# Patient Record
Sex: Male | Born: 1960 | Race: White | Hispanic: No | Smoking: Current every day smoker
Health system: Southern US, Community
[De-identification: ages and names within clinical notes are randomized; demographics above are authoritative.]

## PROBLEM LIST (undated history)

## (undated) DIAGNOSIS — I2510 Atherosclerotic heart disease of native coronary artery without angina pectoris: Secondary | ICD-10-CM

## (undated) DIAGNOSIS — Z9119 Patient's noncompliance with other medical treatment and regimen: Secondary | ICD-10-CM

## (undated) DIAGNOSIS — J449 Chronic obstructive pulmonary disease, unspecified: Secondary | ICD-10-CM

## (undated) DIAGNOSIS — I10 Essential (primary) hypertension: Secondary | ICD-10-CM

## (undated) DIAGNOSIS — I219 Acute myocardial infarction, unspecified: Secondary | ICD-10-CM

## (undated) DIAGNOSIS — F1910 Other psychoactive substance abuse, uncomplicated: Secondary | ICD-10-CM

## (undated) DIAGNOSIS — B1920 Unspecified viral hepatitis C without hepatic coma: Secondary | ICD-10-CM

## (undated) DIAGNOSIS — Z91199 Patient's noncompliance with other medical treatment and regimen due to unspecified reason: Secondary | ICD-10-CM

## (undated) DIAGNOSIS — I509 Heart failure, unspecified: Secondary | ICD-10-CM

## (undated) DIAGNOSIS — Z720 Tobacco use: Secondary | ICD-10-CM

## (undated) HISTORY — PX: BACK SURGERY: SHX140

## (undated) HISTORY — PX: OTHER SURGICAL HISTORY: SHX169

## (undated) HISTORY — PX: NECK SURGERY: SHX720

---

## 2018-06-12 ENCOUNTER — Emergency Department (HOSPITAL_COMMUNITY)
Admission: EM | Admit: 2018-06-12 | Discharge: 2018-06-12 | Disposition: A | Discharge status: Home / Self Care | Payer: Self-pay | Attending: Emergency Medicine | Admitting: Emergency Medicine

## 2018-06-12 ENCOUNTER — Other Ambulatory Visit: Payer: Self-pay

## 2018-06-12 ENCOUNTER — Encounter (HOSPITAL_COMMUNITY): Payer: Self-pay | Admitting: *Deleted

## 2018-06-12 ENCOUNTER — Emergency Department (HOSPITAL_COMMUNITY): Payer: Self-pay

## 2018-06-12 DIAGNOSIS — J449 Chronic obstructive pulmonary disease, unspecified: Secondary | ICD-10-CM | POA: Insufficient documentation

## 2018-06-12 DIAGNOSIS — J441 Chronic obstructive pulmonary disease with (acute) exacerbation: Secondary | ICD-10-CM

## 2018-06-12 DIAGNOSIS — I2510 Atherosclerotic heart disease of native coronary artery without angina pectoris: Secondary | ICD-10-CM | POA: Insufficient documentation

## 2018-06-12 DIAGNOSIS — R0602 Shortness of breath: Secondary | ICD-10-CM

## 2018-06-12 DIAGNOSIS — I252 Old myocardial infarction: Secondary | ICD-10-CM | POA: Insufficient documentation

## 2018-06-12 DIAGNOSIS — R062 Wheezing: Secondary | ICD-10-CM | POA: Insufficient documentation

## 2018-06-12 DIAGNOSIS — F17200 Nicotine dependence, unspecified, uncomplicated: Secondary | ICD-10-CM | POA: Insufficient documentation

## 2018-06-12 DIAGNOSIS — I10 Essential (primary) hypertension: Secondary | ICD-10-CM | POA: Insufficient documentation

## 2018-06-12 HISTORY — DX: Essential (primary) hypertension: I10

## 2018-06-12 HISTORY — DX: Unspecified viral hepatitis C without hepatic coma: B19.20

## 2018-06-12 HISTORY — DX: Acute myocardial infarction, unspecified: I21.9

## 2018-06-12 HISTORY — DX: Atherosclerotic heart disease of native coronary artery without angina pectoris: I25.10

## 2018-06-12 HISTORY — DX: Chronic obstructive pulmonary disease, unspecified: J44.9

## 2018-06-12 LAB — CBC
HCT: 44.2 % (ref 39.0–52.0)
Hemoglobin: 14.5 g/dL (ref 13.0–17.0)
MCH: 32.1 pg (ref 26.0–34.0)
MCHC: 32.8 g/dL (ref 30.0–36.0)
MCV: 97.8 fL (ref 80.0–100.0)
Platelets: 272 10*3/uL (ref 150–400)
RBC: 4.52 MIL/uL (ref 4.22–5.81)
RDW: 13.2 % (ref 11.5–15.5)
WBC: 8.8 10*3/uL (ref 4.0–10.5)
nRBC: 0 % (ref 0.0–0.2)

## 2018-06-12 LAB — BASIC METABOLIC PANEL
Anion gap: 9 (ref 5–15)
BUN: 9 mg/dL (ref 6–20)
CO2: 25 mmol/L (ref 22–32)
Calcium: 9.3 mg/dL (ref 8.9–10.3)
Chloride: 103 mmol/L (ref 98–111)
Creatinine, Ser: 0.74 mg/dL (ref 0.61–1.24)
GFR calc Af Amer: >60 mL/min (ref >60)
GFR calc non Af Amer: >60 mL/min (ref >60)
Glucose, Bld: 102 mg/dL — ABNORMAL HIGH (ref 70–99)
Potassium: 3.6 mmol/L (ref 3.5–5.1)
SODIUM: 137 mmol/L (ref 135–145)

## 2018-06-12 MED ORDER — ALBUTEROL SULFATE (2.5 MG/3ML) 0.083% IN NEBU
5.0000 mg | INHALATION_SOLUTION | Freq: Once | RESPIRATORY_TRACT | Status: DC
  Filled 2018-06-12: qty 6

## 2018-06-12 MED ORDER — ALBUTEROL SULFATE HFA 108 (90 BASE) MCG/ACT IN AERS
2.0000 | INHALATION_SPRAY | RESPIRATORY_TRACT | Status: DC
  Administered 2018-06-12: 2 via RESPIRATORY_TRACT
  Filled 2018-06-12: qty 6.7

## 2018-06-12 MED ORDER — ALBUTEROL (5 MG/ML) CONTINUOUS INHALATION SOLN
10.0000 mg/h | INHALATION_SOLUTION | Freq: Once | RESPIRATORY_TRACT | Status: AC
  Administered 2018-06-12: 10 mg/h via RESPIRATORY_TRACT
  Filled 2018-06-12: qty 40

## 2018-06-12 MED ORDER — PREDNISONE 20 MG PO TABS
60.0000 mg | ORAL_TABLET | Freq: Once | ORAL | Status: AC
  Administered 2018-06-12: 60 mg via ORAL
  Filled 2018-06-12: qty 3

## 2018-06-12 MED ORDER — IPRATROPIUM BROMIDE 0.02 % IN SOLN
0.5000 mg | Freq: Once | RESPIRATORY_TRACT | Status: AC
  Administered 2018-06-12: 0.5 mg via RESPIRATORY_TRACT
  Filled 2018-06-12: qty 2.5

## 2018-06-12 MED ORDER — PREDNISONE 50 MG PO TABS: ORAL_TABLET | ORAL | 0 refills | Status: DC

## 2018-06-12 MED ORDER — ALBUTEROL SULFATE HFA 108 (90 BASE) MCG/ACT IN AERS: 1.0000 | INHALATION_SPRAY | Freq: Four times a day (QID) | RESPIRATORY_TRACT | 0 refills | Status: DC | PRN

## 2018-06-12 NOTE — ED Notes (Signed)
Amber, RN aware of elevated BP previously taken.  Pt brought back to rm 5 from triage and was told to change into gown.

## 2018-06-12 NOTE — ED Provider Notes (Signed)
[facility]-EMERGENCY DEPT Provider Note   CSN: [id number] Arrival date & time: 06/12/18  1721    History   Chief Complaint Chief Complaint  Patient presents with  . flu like illness    HPI Francisco [name] is a 58 y.o. male.     58 year old male with history of hypertension and COPD who continues to use tobacco products who presents with several days of nonproductive cough as well as congestion.  Has been using his home nebulizer as well as inhaler without relief.  States that usually when he gets this way he requires prednisone.  Has had subjective fever last night but denies any vomiting or diarrhea.  No anginal or CHF type symptoms.     Past Medical History:  Diagnosis Date  . COPD (chronic obstructive pulmonary disease) (HCC)   . Coronary artery disease   . Hepatitis-C   . Hypertension   . MI (myocardial infarction) (HCC)     There are no active problems to display for this patient.   Past Surgical History:  Procedure Laterality Date  . BACK SURGERY    . NECK SURGERY    . stints          Home Medications    Prior to Admission medications   Not on File    Family History No family history on file.  Social History Social History   Tobacco Use  . Smoking status: Current Every Day Smoker  . Smokeless tobacco: Never Used  Substance Use Topics  . Alcohol use: Never    Frequency: Never  . 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    Physical Exam Updated Vital Signs BP (!) 153/109 (BP Location: Right Arm)   Pulse 98   Temp 98.8 F (37.1 C) (Oral)   Resp 20   Ht 1.829 m (6')   Wt 86.2 kg   SpO2 96%   BMI 25.77 kg/m   Physical Exam Vitals signs and nursing note reviewed.  Constitutional:      General: He is not in acute distress.  Appearance: Normal appearance. He is well-developed. He is not toxic-appearing.  HENT:     Head: Normocephalic and atraumatic.  Eyes:     General: Lids are normal.     Conjunctiva/sclera: Conjunctivae normal.     Pupils: Pupils are equal, round, and reactive to light.  Neck:     Musculoskeletal: Normal range of motion and neck supple.     Thyroid: No thyroid mass.     Trachea: No tracheal deviation.  Cardiovascular:     Rate and Rhythm: Normal rate and regular rhythm.     Heart sounds: Normal heart sounds. No murmur. No gallop.   Pulmonary:     Effort: Pulmonary effort is normal. No respiratory distress.     Breath sounds: No stridor. Examination of the right-upper field reveals decreased breath sounds and wheezing. Examination of the left-upper field reveals decreased breath sounds and wheezing. Decreased breath sounds and wheezing present. No rhonchi or rales.  Abdominal:     General: Bowel sounds are normal. There is no distension.     Palpations: Abdomen is soft.     Tenderness: There is no abdominal tenderness. There is no rebound.  Musculoskeletal: Normal range of motion.  General: No tenderness.  Skin:    General: Skin is warm and dry.     Findings: No abrasion or rash.  Neurological:     Mental Status: He is alert and oriented to person, place, and time.     GCS: GCS eye subscore is 4. GCS verbal subscore is 5. GCS motor subscore is 6.     Cranial Nerves: No cranial nerve deficit.     Sensory: No sensory deficit.  Psychiatric:        Speech: Speech normal.        Behavior: Behavior normal.      ED Treatments / Results  Labs (all labs ordered are listed, but only abnormal results are displayed) Labs Reviewed  CBC  BASIC METABOLIC PANEL    EKG EKG Interpretation  Date/Time:  Monday June 12 2018 17:31:22 EST Ventricular Rate:  93 PR Interval:    QRS Duration: 84 QT Interval:  346 QTC Calculation: 431 R Axis:   83 Text Interpretation:  Sinus  rhythm Right atrial enlargement Borderline repolarization abnormality No old tracing to compare Confirmed by Lorre Nick ([id number]) on 06/12/2018 8:00:14 PM   Radiology No results found.  Procedures Procedures (including critical care time)  Medications Ordered in ED Medications  albuterol (PROVENTIL) (2.5 MG/3ML) 0.083% nebulizer solution 5 mg (5 mg Nebulization Not Given 06/12/18 1858)  albuterol (PROVENTIL,VENTOLIN) solution continuous neb (has no administration in time range)  ipratropium (ATROVENT) nebulizer solution 0.5 mg (has no administration in time range)  predniSONE (DELTASONE) tablet 60 mg (has no administration in time range)     Initial Impression / Assessment and Plan / ED Course  I have reviewed the triage vital signs and the nursing notes.  Pertinent labs & imaging results that were available during my care of the patient were reviewed by me and considered in my medical decision making (see chart for details).        Patient given albuterol with Atrovent along with prednisone.  His wheezes have much improved.  Chest x-ray without acute findings.  We discharged home on prednisone burst and given albuterol to go home with  Final Clinical Impressions(s) / ED Diagnoses   Final diagnoses:  SOB (shortness of breath)    ED Discharge Orders    None       Lorre Nick, MD 06/12/18 2154

## 2018-06-12 NOTE — ED Triage Notes (Addendum)
Pt reports 2-3 days of coughing and runny nose, pt coughing green thick secretions, c/o difficulty breathing, has COPD Pt recently moved here and is out of his bp meds

## 2018-06-14 ENCOUNTER — Emergency Department (HOSPITAL_COMMUNITY)
Admission: EM | Admit: 2018-06-14 | Discharge: 2018-06-14 | Disposition: A | Discharge status: Home / Self Care | Payer: Self-pay | Attending: Emergency Medicine | Admitting: Emergency Medicine

## 2018-06-14 ENCOUNTER — Encounter (HOSPITAL_COMMUNITY): Payer: Self-pay

## 2018-06-14 ENCOUNTER — Other Ambulatory Visit: Payer: Self-pay

## 2018-06-14 ENCOUNTER — Emergency Department (HOSPITAL_COMMUNITY): Payer: Self-pay

## 2018-06-14 DIAGNOSIS — I10 Essential (primary) hypertension: Secondary | ICD-10-CM | POA: Insufficient documentation

## 2018-06-14 DIAGNOSIS — I2510 Atherosclerotic heart disease of native coronary artery without angina pectoris: Secondary | ICD-10-CM | POA: Insufficient documentation

## 2018-06-14 DIAGNOSIS — I252 Old myocardial infarction: Secondary | ICD-10-CM | POA: Insufficient documentation

## 2018-06-14 DIAGNOSIS — Z7982 Long term (current) use of aspirin: Secondary | ICD-10-CM | POA: Insufficient documentation

## 2018-06-14 DIAGNOSIS — Z79899 Other long term (current) drug therapy: Secondary | ICD-10-CM | POA: Insufficient documentation

## 2018-06-14 DIAGNOSIS — F17210 Nicotine dependence, cigarettes, uncomplicated: Secondary | ICD-10-CM | POA: Insufficient documentation

## 2018-06-14 DIAGNOSIS — J441 Chronic obstructive pulmonary disease with (acute) exacerbation: Secondary | ICD-10-CM | POA: Insufficient documentation

## 2018-06-14 LAB — INFLUENZA PANEL BY PCR (TYPE A & B)
Influenza A By PCR: NEGATIVE
Influenza B By PCR: NEGATIVE

## 2018-06-14 LAB — BASIC METABOLIC PANEL
Anion gap: 11 (ref 5–15)
BUN: 13 mg/dL (ref 6–20)
CALCIUM: 9.2 mg/dL (ref 8.9–10.3)
CO2: 23 mmol/L (ref 22–32)
CREATININE: 0.77 mg/dL (ref 0.61–1.24)
Chloride: 105 mmol/L (ref 98–111)
GFR calc Af Amer: >60 mL/min (ref >60)
GFR calc non Af Amer: >60 mL/min (ref >60)
Glucose, Bld: 130 mg/dL — ABNORMAL HIGH (ref 70–99)
Potassium: 4.2 mmol/L (ref 3.5–5.1)
Sodium: 139 mmol/L (ref 135–145)

## 2018-06-14 LAB — CBC WITH DIFFERENTIAL/PLATELET
Abs Immature Granulocytes: 0.04 10*3/uL (ref 0.00–0.07)
Basophils Absolute: 0 10*3/uL (ref 0.0–0.1)
Basophils Relative: 0 %
EOS ABS: 0 10*3/uL (ref 0.0–0.5)
Eosinophils Relative: 0 %
HCT: 45.2 % (ref 39.0–52.0)
Hemoglobin: 14.8 g/dL (ref 13.0–17.0)
Immature Granulocytes: 0 %
LYMPHS ABS: 1.3 10*3/uL (ref 0.7–4.0)
Lymphocytes Relative: 14 %
MCH: 32.7 pg (ref 26.0–34.0)
MCHC: 32.7 g/dL (ref 30.0–36.0)
MCV: 100 fL (ref 80.0–100.0)
Monocytes Absolute: 0.3 10*3/uL (ref 0.1–1.0)
Monocytes Relative: 3 %
NRBC: 0 % (ref 0.0–0.2)
Neutro Abs: 7.3 10*3/uL (ref 1.7–7.7)
Neutrophils Relative %: 83 %
Platelets: 313 10*3/uL (ref 150–400)
RBC: 4.52 MIL/uL (ref 4.22–5.81)
RDW: 13.3 % (ref 11.5–15.5)
WBC: 8.9 10*3/uL (ref 4.0–10.5)

## 2018-06-14 LAB — I-STAT TROPONIN, ED: Troponin i, poc: 0 ng/mL (ref 0.00–0.08)

## 2018-06-14 MED ORDER — DEXAMETHASONE SODIUM PHOSPHATE 10 MG/ML IJ SOLN
10.0000 mg | Freq: Once | INTRAMUSCULAR | Status: DC
  Filled 2018-06-14: qty 1

## 2018-06-14 MED ORDER — BENZONATATE 100 MG PO CAPS
200.0000 mg | ORAL_CAPSULE | Freq: Once | ORAL | Status: AC
  Administered 2018-06-14: 200 mg via ORAL
  Filled 2018-06-14: qty 2

## 2018-06-14 MED ORDER — IPRATROPIUM-ALBUTEROL 0.5-2.5 (3) MG/3ML IN SOLN
3.0000 mL | Freq: Once | RESPIRATORY_TRACT | Status: AC
  Administered 2018-06-14: 3 mL via RESPIRATORY_TRACT
  Filled 2018-06-14: qty 3

## 2018-06-14 MED ORDER — METHYLPREDNISOLONE 4 MG PO TBPK: ORAL_TABLET | ORAL | 0 refills | Status: DC

## 2018-06-14 MED ORDER — BENZONATATE 100 MG PO CAPS: 100.0000 mg | ORAL_CAPSULE | Freq: Three times a day (TID) | ORAL | 0 refills | Status: DC

## 2018-06-14 MED ORDER — ALBUTEROL SULFATE (2.5 MG/3ML) 0.083% IN NEBU
5.0000 mg | INHALATION_SOLUTION | Freq: Once | RESPIRATORY_TRACT | Status: AC
  Administered 2018-06-14: 5 mg via RESPIRATORY_TRACT
  Filled 2018-06-14: qty 6

## 2018-06-14 MED ORDER — DOXYCYCLINE HYCLATE 100 MG PO CAPS: 100.0000 mg | ORAL_CAPSULE | Freq: Two times a day (BID) | ORAL | 0 refills | Status: DC

## 2018-06-14 MED ORDER — METHYLPREDNISOLONE SODIUM SUCC 125 MG IJ SOLR
80.0000 mg | Freq: Once | INTRAMUSCULAR | Status: AC
  Administered 2018-06-14: 80 mg via INTRAVENOUS
  Filled 2018-06-14: qty 2

## 2018-06-14 MED ORDER — DEXAMETHASONE SODIUM PHOSPHATE 10 MG/ML IJ SOLN
10.0000 mg | Freq: Once | INTRAMUSCULAR | Status: AC
  Administered 2018-06-14: 10 mg via INTRAVENOUS

## 2018-06-14 MED ORDER — SODIUM CHLORIDE 0.9 % IV BOLUS
500.0000 mL | Freq: Once | INTRAVENOUS | Status: AC
  Administered 2018-06-14: 500 mL via INTRAVENOUS

## 2018-06-14 MED ORDER — ALBUTEROL SULFATE (2.5 MG/3ML) 0.083% IN NEBU
2.5000 mg | INHALATION_SOLUTION | Freq: Once | RESPIRATORY_TRACT | Status: AC
  Administered 2018-06-14: 2.5 mg via RESPIRATORY_TRACT
  Filled 2018-06-14: qty 3

## 2018-06-14 NOTE — ED Triage Notes (Addendum)
Patient was seen 2 days ago for a cough. Patient c/o worsening cough with green sputum and increased SOB. Patient states he was given a RX for Albuterol inhaler and Prednisone. Patient has a history of COPD.  Patient states since having increased episodes of coughing his mid and upper back are hurting.

## 2018-06-14 NOTE — Discharge Instructions (Addendum)
We saw you in the ER for your breathing related complains. We gave you some breathing treatments in the ER, and seems like your symptoms have improved. °Please take albuterol as needed every 4 hours. °Please take the medications prescribed. °Please refrain from smoking or smoke exposure. °Please see a primary care doctor in 1 week. °Return to the ER if your symptoms worsen. ° °

## 2018-06-14 NOTE — ED Provider Notes (Signed)
[facility]-EMERGENCY DEPT Provider Note   CSN: [id number] Arrival date & time: 06/14/18  1539    History   Chief Complaint Chief Complaint  Patient presents with  . Cough  . Back Pain    HPI Francisco [name] is a 58 y.o. male with history of COPD presenting today for cough and shortness of breath  Patient reports that for the past week he has had been dealing with what he describes as an upper respiratory tract infection.  He endorses gradual onset of rhinorrhea, subjective fevers body aches followed by nonproductive cough.  Patient was seen at the emergency department 2 days ago where he was diagnosed with COPD exacerbation, prescribed albuterol, prednisone and given return precautions.  Patient endorses compliance with his medication regimen but endorses worsening symptoms at that time.  Patient reports that his cough is now productive with yellow/green sputum and has become more regular.  He states that he feels that he has become more short of breath the past 2 days with his cough.  He reports that his cough has led him to feel sore in his upper back.  Patient endorses fever and chills for the past 2 days.  He denies chest pain.  He states that he has been using albuterol for his shortness of breath with only temporary mild relief.    HPI  Past Medical History:  Diagnosis Date  . COPD (chronic obstructive pulmonary disease) (HCC)   . Coronary artery disease   . Hepatitis-C   . Hypertension   . MI (myocardial infarction) (HCC)     There are no active problems to display for this patient.   Past Surgical History:  Procedure Laterality Date  . BACK SURGERY    . NECK SURGERY    . stints          Home Medications    Prior to Admission medications   Medication Sig Start Date End Date Taking? Authorizing Provider  albuterol (PROVENTIL HFA;VENTOLIN HFA) 108 (90 Base) MCG/ACT inhaler Inhale 1-2 puffs into the lungs every 6 (six) hours as needed for  wheezing or shortness of breath. 06/12/18  Yes Lorre Nick, MD  aspirin EC 81 MG tablet Take 81 mg by mouth daily.   Yes [provider]  lisinopril (PRINIVIL,ZESTRIL) 10 MG tablet Take 10 mg by mouth daily.   Yes [provider]  predniSONE (DELTASONE) 50 MG tablet 1 p.o. daily x5 06/12/18  Yes Lorre Nick, MD  benzonatate (TESSALON) 100 MG capsule Take 1 capsule (100 mg total) by mouth every 8 (eight) hours. 06/14/18   Derwood Kaplan, MD  doxycycline (VIBRAMYCIN) 100 MG capsule Take 1 capsule (100 mg total) by mouth 2 (two) times daily. 06/14/18   Derwood Kaplan, MD  methylPREDNISolone (MEDROL DOSEPAK) 4 MG TBPK tablet Day 1: 24 mg (6 tablets).   Day 2: 20 mg (5 tablets).  Day 3: 16 mg (4 tablets). Day 4: 12 mg (3 tablets).  Day 5: 8 mg (2 tablets).  Day 6: 4 mg (1 tablet). 06/14/18   Bill Salinas, PA-C    Family History Family History  Problem Relation Age of Onset  . Rheum arthritis Mother     Social History Social History   Tobacco Use  . Smoking status: Current Every Day Smoker    Packs/day: 0.75    Types: Cigarettes  . Smokeless tobacco: Never Used  Substance Use Topics  . Alcohol use: Never    Frequency: Never  . 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  HENT: Positive for rhinorrhea. Negative for facial swelling, sore throat, trouble swallowing and voice change.   Respiratory: Positive for cough, shortness of breath and wheezing.   Cardiovascular: Negative.  Negative for chest pain.  Gastrointestinal: Negative.  Negative for abdominal pain, diarrhea, nausea and vomiting.  Musculoskeletal: Positive for arthralgias and myalgias. Negative for neck pain and neck stiffness.  Neurological: Negative.  Negative for dizziness, syncope, weakness, numbness and headaches.    All other systems reviewed and are negative.  Physical Exam Updated Vital Signs BP (!) 120/98   Pulse (!) 110   Temp 98.5 F (36.9 C) (Oral)   Resp 16   Ht 6' (1.829 m)   Wt 81.6 kg   SpO2 100%   BMI 24.41 kg/m   Physical Exam Constitutional:      General: He is not in acute distress.    Appearance: Normal appearance. He is well-developed. He is not diaphoretic.  HENT:     Head: Normocephalic and atraumatic.     Right Ear: Tympanic membrane, ear canal and external ear normal.     Left Ear: Tympanic membrane, ear canal and external ear normal.     Nose: Nose normal.     Mouth/Throat:     Mouth: Mucous membranes are moist.     Pharynx: Oropharynx is clear.  Eyes:     General: Vision grossly intact. Gaze aligned appropriately.     Extraocular Movements: Extraocular movements intact.     Conjunctiva/sclera: Conjunctivae normal.     Pupils: Pupils are equal, round, and reactive to light.  Neck:     Musculoskeletal: Normal range of motion.     Trachea: Trachea and phonation normal. No tracheal deviation.  Cardiovascular:     Rate and Rhythm: Regular rhythm. Tachycardia present.     Pulses: Normal pulses.     Heart sounds: Normal heart sounds.  Pulmonary:     Effort: Pulmonary effort is normal. No respiratory distress.     Breath sounds: Wheezing and rhonchi present.  Abdominal:     General: There is no distension.     Palpations: Abdomen is soft.     Tenderness: There is no abdominal tenderness. There is no guarding or rebound.  Musculoskeletal: Normal range of motion.        General: No tenderness.     Right lower leg: No edema.     Left lower leg: No edema.  Skin:    General: Skin is warm and dry.     Capillary Refill: Capillary refill takes less than 2 seconds.  Neurological:     General: No focal deficit present.     Mental Status: He is alert.     GCS: GCS eye subscore is 4. GCS verbal subscore is 5. GCS motor subscore is 6.     Comments: Speech is clear and  goal oriented, follows commands Major Cranial nerves without deficit, no facial droop Moves extremities without ataxia, coordination intact  Psychiatric:        Behavior: Behavior normal.      ED Treatments / Results  Labs (all labs ordered are listed, but only abnormal results are displayed) Labs Reviewed  BASIC METABOLIC PANEL - Abnormal; Notable for the following components:      Result  Value   Glucose, Bld 130 (*)    All other components within normal limits  CBC WITH DIFFERENTIAL/PLATELET  INFLUENZA PANEL BY PCR (TYPE A & B)  I-STAT TROPONIN, ED    EKG EKG Interpretation  Date/Time:  Wednesday June 14 2018 16:57:54 EST Ventricular Rate:  110 PR Interval:    QRS Duration: 84 QT Interval:  349 QTC Calculation: 473 R Axis:   77 Text Interpretation:  Sinus tachycardia Biatrial enlargement Minimal ST depression, inferior leads No acute changes No significant change since last tracing Confirmed by Derwood Kaplan [id number]) on 06/14/2018 5:15:22 PM   Radiology Dg Chest 2 View  Result Date: 06/14/2018 CLINICAL DATA:  Cough EXAM: CHEST - 2 VIEW COMPARISON:  06/12/2018 FINDINGS: The heart size and mediastinal contours are within normal limits. Both lungs are clear. The visualized skeletal structures are unremarkable. IMPRESSION: No active cardiopulmonary disease. Electronically Signed   By: Deatra Robinson M.D.   On: 06/14/2018 17:19   Dg Chest 2 View  Result Date: 06/12/2018 CLINICAL DATA:  Productive cough EXAM: CHEST - 2 VIEW COMPARISON:  None. FINDINGS: Heart and mediastinal contours are within normal limits. No focal opacities or effusions. No acute bony abnormality. IMPRESSION: No active cardiopulmonary disease. Electronically Signed   By: Charlett Nose M.D.   On: 06/12/2018 20:54    Procedures Procedures (including critical care time)  Medications Ordered in ED Medications  albuterol (PROVENTIL) (2.5 MG/3ML) 0.083% nebulizer solution 5 mg (5 mg Nebulization Given  06/14/18 1619)  sodium chloride 0.9 % bolus 500 mL (0 mLs Intravenous Stopped 06/14/18 1816)  ipratropium-albuterol (DUONEB) 0.5-2.5 (3) MG/3ML nebulizer solution 3 mL (3 mLs Nebulization Given 06/14/18 1658)  albuterol (PROVENTIL) (2.5 MG/3ML) 0.083% nebulizer solution 2.5 mg (2.5 mg Nebulization Given 06/14/18 1825)  methylPREDNISolone sodium succinate (SOLU-MEDROL) 125 mg/2 mL injection 80 mg (80 mg Intravenous Given 06/14/18 1828)  benzonatate (TESSALON) capsule 200 mg (200 mg Oral Given 06/14/18 1831)  dexamethasone (DECADRON) injection 10 mg (10 mg Intravenous Given 06/14/18 1825)     Initial Impression / Assessment and Plan / ED Course  I have reviewed the triage vital signs and the nursing notes.  Pertinent labs & imaging results that were available during my care of the patient were reviewed by me and considered in my medical decision making (see chart for details).    58 year old male with history of COPD presents today for cough, wheezing and shortness of breath.  Appears to be in exacerbation of COPD today.  He has been taking as needed albuterol inhaler at home along with prednisone with minimal relief. - Troponin negative CBC within normal limits BMP nonacute Influenza panel negative Chest x-ray negative EKG without acute changes confirmed by Dr. Rhunette Croft. - Patient given 2 breathing treatments here in emergency department and greatly improved. - Patient seen and evaluated by Dr. Rhunette Croft who advised the patient may be discharged at this time; medication changes include starting patient on doxycycline and change patient to a Solu-Medrol pack instead of the prednisone. - On reevaluation patient is well-appearing in no acute distress appears greatly improved from arrival he is requesting discharge at this time.  He states understanding of medication changes and return precautions.  At this time there does not appear to be any evidence of an acute emergency medical condition and  the patient appears stable for discharge with appropriate outpatient follow up. Diagnosis was discussed with patient who verbalizes understanding of care plan and is agreeable to discharge. I have discussed return precautions with  patient who verbalizes understanding of return precautions. Patient strongly encouraged to follow-up with their PCP. All questions answered.  [name] case rediscussed with Dr. Vivia EwingNanvati who agrees with plan to discharge with follow-up.   Note: Portions of this report may have been transcribed using voice recognition software. Every effort was made to ensure accuracy; however, inadvertent computerized transcription errors may still be present. Final Clinical Impressions(s) / ED Diagnoses   Final diagnoses:  COPD with acute exacerbation [facility](HCC)    ED Discharge Orders         Ordered    doxycycline (VIBRAMYCIN) 100 MG capsule  2 times daily     06/14/18 1821    benzonatate (TESSALON) 100 MG capsule  Every 8 hours     06/14/18 1822    methylPREDNISolone (MEDROL DOSEPAK) 4 MG TBPK tablet     06/14/18 1832           Elizabeth PalauMorelli, Marton Malizia A, PA-C 06/14/18 1953    Derwood KaplanNanavati, Ankit, MD 06/15/18 [phone]

## 2018-06-14 NOTE — ED Notes (Signed)
Patient transported to X-ray 

## 2018-06-16 ENCOUNTER — Other Ambulatory Visit: Payer: Self-pay

## 2018-06-16 ENCOUNTER — Encounter (HOSPITAL_COMMUNITY): Payer: Self-pay | Admitting: Emergency Medicine

## 2018-06-16 ENCOUNTER — Emergency Department (HOSPITAL_COMMUNITY): Payer: Self-pay

## 2018-06-16 ENCOUNTER — Other Ambulatory Visit (HOSPITAL_COMMUNITY): Payer: Self-pay

## 2018-06-16 ENCOUNTER — Observation Stay (HOSPITAL_COMMUNITY)
Admission: EM | Admit: 2018-06-16 | Discharge: 2018-06-19 | Disposition: A | Discharge status: Home / Self Care | Payer: Self-pay | Attending: Internal Medicine | Admitting: Internal Medicine

## 2018-06-16 DIAGNOSIS — M25519 Pain in unspecified shoulder: Secondary | ICD-10-CM | POA: Insufficient documentation

## 2018-06-16 DIAGNOSIS — J9601 Acute respiratory failure with hypoxia: Secondary | ICD-10-CM | POA: Diagnosis present

## 2018-06-16 DIAGNOSIS — I2510 Atherosclerotic heart disease of native coronary artery without angina pectoris: Secondary | ICD-10-CM | POA: Insufficient documentation

## 2018-06-16 DIAGNOSIS — M546 Pain in thoracic spine: Secondary | ICD-10-CM | POA: Insufficient documentation

## 2018-06-16 DIAGNOSIS — Z79899 Other long term (current) drug therapy: Secondary | ICD-10-CM | POA: Insufficient documentation

## 2018-06-16 DIAGNOSIS — Z23 Encounter for immunization: Secondary | ICD-10-CM | POA: Insufficient documentation

## 2018-06-16 DIAGNOSIS — Z7982 Long term (current) use of aspirin: Secondary | ICD-10-CM | POA: Insufficient documentation

## 2018-06-16 DIAGNOSIS — Z888 Allergy status to other drugs, medicaments and biological substances status: Secondary | ICD-10-CM | POA: Insufficient documentation

## 2018-06-16 DIAGNOSIS — Z716 Tobacco abuse counseling: Secondary | ICD-10-CM | POA: Insufficient documentation

## 2018-06-16 DIAGNOSIS — Z885 Allergy status to narcotic agent status: Secondary | ICD-10-CM | POA: Insufficient documentation

## 2018-06-16 DIAGNOSIS — I10 Essential (primary) hypertension: Secondary | ICD-10-CM | POA: Insufficient documentation

## 2018-06-16 DIAGNOSIS — J441 Chronic obstructive pulmonary disease with (acute) exacerbation: Principal | ICD-10-CM | POA: Insufficient documentation

## 2018-06-16 DIAGNOSIS — I252 Old myocardial infarction: Secondary | ICD-10-CM | POA: Insufficient documentation

## 2018-06-16 DIAGNOSIS — F17210 Nicotine dependence, cigarettes, uncomplicated: Secondary | ICD-10-CM | POA: Insufficient documentation

## 2018-06-16 DIAGNOSIS — R000 Tachycardia, unspecified: Secondary | ICD-10-CM | POA: Insufficient documentation

## 2018-06-16 LAB — RAPID URINE DRUG SCREEN, HOSP PERFORMED
Amphetamines: POSITIVE — AB
Barbiturates: NOT DETECTED
Benzodiazepines: NOT DETECTED
Cocaine: NOT DETECTED
Opiates: POSITIVE — AB
Tetrahydrocannabinol: NOT DETECTED

## 2018-06-16 LAB — BASIC METABOLIC PANEL
Anion gap: 12 (ref 5–15)
BUN: 12 mg/dL (ref 6–20)
CO2: 26 mmol/L (ref 22–32)
Calcium: 9 mg/dL (ref 8.9–10.3)
Chloride: 99 mmol/L (ref 98–111)
Creatinine, Ser: 0.69 mg/dL (ref 0.61–1.24)
GFR calc Af Amer: >60 mL/min (ref >60)
GFR calc non Af Amer: >60 mL/min (ref >60)
Glucose, Bld: 175 mg/dL — ABNORMAL HIGH (ref 70–99)
Potassium: 3.8 mmol/L (ref 3.5–5.1)
Sodium: 137 mmol/L (ref 135–145)

## 2018-06-16 LAB — HEPATIC FUNCTION PANEL
ALT: 74 U/L — ABNORMAL HIGH (ref 0–44)
AST: 23 U/L (ref 15–41)
Albumin: 3.7 g/dL (ref 3.5–5.0)
Alkaline Phosphatase: 77 U/L (ref 38–126)
BILIRUBIN TOTAL: 0.4 mg/dL (ref 0.3–1.2)
Bilirubin, Direct: 0.1 mg/dL (ref 0.0–0.2)
Indirect Bilirubin: 0.3 mg/dL (ref 0.3–0.9)
Total Protein: 7 g/dL (ref 6.5–8.1)

## 2018-06-16 LAB — URINALYSIS, ROUTINE W REFLEX MICROSCOPIC
Bilirubin Urine: NEGATIVE
Glucose, UA: NEGATIVE mg/dL
HGB URINE DIPSTICK: NEGATIVE
Ketones, ur: NEGATIVE mg/dL
Leukocytes,Ua: NEGATIVE
Nitrite: NEGATIVE
Protein, ur: NEGATIVE mg/dL
Specific Gravity, Urine: 1.017 (ref 1.005–1.030)
pH: 7 (ref 5.0–8.0)

## 2018-06-16 LAB — POCT I-STAT TROPONIN I: Troponin i, poc: 0.01 ng/mL (ref 0.00–0.08)

## 2018-06-16 LAB — CBC WITH DIFFERENTIAL/PLATELET
Abs Immature Granulocytes: 0.07 10*3/uL (ref 0.00–0.07)
BASOS PCT: 0 %
Basophils Absolute: 0 10*3/uL (ref 0.0–0.1)
Eosinophils Absolute: 0.1 10*3/uL (ref 0.0–0.5)
Eosinophils Relative: 1 %
HCT: 45.9 % (ref 39.0–52.0)
Hemoglobin: 14.7 g/dL (ref 13.0–17.0)
Immature Granulocytes: 1 %
Lymphocytes Relative: 10 %
Lymphs Abs: 1.1 10*3/uL (ref 0.7–4.0)
MCH: 31.9 pg (ref 26.0–34.0)
MCHC: 32 g/dL (ref 30.0–36.0)
MCV: 99.6 fL (ref 80.0–100.0)
MONOS PCT: 7 %
Monocytes Absolute: 0.8 10*3/uL (ref 0.1–1.0)
NEUTROS PCT: 81 %
Neutro Abs: 8.9 10*3/uL — ABNORMAL HIGH (ref 1.7–7.7)
Platelets: 358 10*3/uL (ref 150–400)
RBC: 4.61 MIL/uL (ref 4.22–5.81)
RDW: 13.3 % (ref 11.5–15.5)
WBC: 11 10*3/uL — ABNORMAL HIGH (ref 4.0–10.5)
nRBC: 0 % (ref 0.0–0.2)

## 2018-06-16 LAB — TSH: TSH: 0.443 u[IU]/mL (ref 0.350–4.500)

## 2018-06-16 LAB — LIPASE, BLOOD: Lipase: 31 U/L (ref 11–51)

## 2018-06-16 LAB — BRAIN NATRIURETIC PEPTIDE: B Natriuretic Peptide: 221.1 pg/mL — ABNORMAL HIGH (ref 0.0–100.0)

## 2018-06-16 MED ORDER — DIPHENHYDRAMINE HCL 50 MG/ML IJ SOLN
25.0000 mg | Freq: Once | INTRAMUSCULAR | Status: DC
  Filled 2018-06-16: qty 1

## 2018-06-16 MED ORDER — SODIUM CHLORIDE 0.9 % IV BOLUS: 1000.0000 mL | Freq: Once | INTRAVENOUS | Status: DC

## 2018-06-16 MED ORDER — IOPAMIDOL (ISOVUE-370) INJECTION 76%
100.0000 mL | Freq: Once | INTRAVENOUS | Status: AC | PRN
  Administered 2018-06-16: 100 mL via INTRAVENOUS

## 2018-06-16 MED ORDER — LISINOPRIL 10 MG PO TABS
10.0000 mg | ORAL_TABLET | Freq: Once | ORAL | Status: AC
  Administered 2018-06-16: 10 mg via ORAL
  Filled 2018-06-16: qty 1

## 2018-06-16 MED ORDER — METHYLPREDNISOLONE SODIUM SUCC 125 MG IJ SOLR
125.0000 mg | Freq: Once | INTRAMUSCULAR | Status: AC
  Administered 2018-06-16: 125 mg via INTRAVENOUS
  Filled 2018-06-16: qty 2

## 2018-06-16 MED ORDER — SODIUM CHLORIDE (PF) 0.9 % IJ SOLN
INTRAMUSCULAR | Status: AC
  Filled 2018-06-16: qty 50

## 2018-06-16 MED ORDER — DIPHENHYDRAMINE HCL 50 MG/ML IJ SOLN
50.0000 mg | Freq: Once | INTRAMUSCULAR | Status: AC
  Administered 2018-06-16: 50 mg via INTRAVENOUS

## 2018-06-16 MED ORDER — HYDRALAZINE HCL 20 MG/ML IJ SOLN
20.0000 mg | Freq: Once | INTRAMUSCULAR | Status: AC
  Administered 2018-06-16: 20 mg via INTRAVENOUS
  Filled 2018-06-16: qty 1

## 2018-06-16 MED ORDER — IPRATROPIUM BROMIDE 0.02 % IN SOLN
0.5000 mg | Freq: Once | RESPIRATORY_TRACT | Status: AC
  Administered 2018-06-16: 0.5 mg via RESPIRATORY_TRACT
  Filled 2018-06-16: qty 2.5

## 2018-06-16 MED ORDER — MORPHINE SULFATE (PF) 4 MG/ML IV SOLN
4.0000 mg | Freq: Once | INTRAVENOUS | Status: AC
  Administered 2018-06-16: 4 mg via INTRAVENOUS
  Filled 2018-06-16: qty 1

## 2018-06-16 MED ORDER — IOPAMIDOL (ISOVUE-370) INJECTION 76%
INTRAVENOUS | Status: AC
  Filled 2018-06-16: qty 100

## 2018-06-16 MED ORDER — MAGNESIUM SULFATE 2 GM/50ML IV SOLN
2.0000 g | Freq: Once | INTRAVENOUS | Status: AC
  Administered 2018-06-16: 2 g via INTRAVENOUS
  Filled 2018-06-16: qty 50

## 2018-06-16 MED ORDER — ALBUTEROL (5 MG/ML) CONTINUOUS INHALATION SOLN
10.0000 mg/h | INHALATION_SOLUTION | Freq: Once | RESPIRATORY_TRACT | Status: AC
  Administered 2018-06-16: 10 mg/h via RESPIRATORY_TRACT
  Filled 2018-06-16: qty 20

## 2018-06-16 NOTE — ED Provider Notes (Signed)
[facility] DEPT Provider Note   CSN: [id number] Arrival date & time: 06/16/18  1920    History   Chief Complaint Chief Complaint  Patient presents with  . Shortness of Breath    HPI Francisco [name] is a 58 y.o. male with a hx of COPD, tobacco abuse, CAD, HTN, and hepatitis C who returns to the ER with complaints of continued dyspnea. Patient seen in the ER 06/14/18 for COPD exacerbation, ultimately was discharged home with prednisone taper and doxycycline.  He states since discharge he has been progressively worsening.  He states that his dyspnea, wheezing, and cough productive of yellow phlegm sputum have persisted.  He states that his back and abdomen are sore from coughing so much, primarily pain in the upper back.  He has been taking his medications from prior ER visit as prescribed, this includes having had 50 mg of prednisone today.  He has been utilizing his inhaler extremely frequently, several times per hour.  He is also had some subjective fevers.  He states his COPD has never been this bad.  Denies chest pain pain, emesis, or syncope. He has been out of his Lisinopril x 1 week. Patient denies EtOH use. 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     HPI  Past Medical History:  Diagnosis Date  . COPD (chronic obstructive pulmonary disease) ([place])   . Coronary artery disease   . Hepatitis-C   . Hypertension   . MI (myocardial infarction) ([place])     There are no active problems to display for this patient.   Past Surgical History:  Procedure Laterality Date  . BACK SURGERY    . NECK SURGERY    . stints          Home Medications    Prior to Admission medications   Medication Sig Start Date End Date Taking? Authorizing Provider  albuterol (PROVENTIL HFA;VENTOLIN HFA) 108 (90 Base) MCG/ACT inhaler Inhale 1-2 puffs into the lungs every 6 (six) hours as needed for wheezing or shortness of breath. 06/12/18   Lacretia Leigh, MD  aspirin EC 81 MG tablet Take 81 mg by mouth daily.    [provider]  benzonatate (TESSALON) 100 MG capsule Take 1 capsule (100 mg total) by mouth every 8 (eight) hours. 06/14/18   Varney Biles, MD  doxycycline (VIBRAMYCIN) 100 MG capsule Take 1 capsule (100 mg total) by mouth 2 (two) times daily. 06/14/18   Varney Biles, MD  lisinopril (PRINIVIL,ZESTRIL) 10 MG tablet Take 10 mg by mouth daily.    [provider]  methylPREDNISolone (MEDROL DOSEPAK) 4 MG TBPK tablet Day 1: 24 mg (6 tablets).   Day 2: 20 mg (5 tablets).  Day 3: 16 mg (4 tablets). Day 4: 12 mg (3 tablets).  Day 5: 8 mg (2 tablets).  Day 6: 4 mg (1 tablet). 06/14/18   Deliah Boston, PA-C  predniSONE (DELTASONE) 50 MG tablet 1 p.o. daily x5 06/12/18   Lacretia Leigh, MD    Family History Family History  Problem Relation Age of Onset  . Rheum arthritis Mother     Social History Social History   Tobacco Use  . Smoking status: Current Every Day Smoker    Packs/day: 0.75    Types: Cigarettes  . Smokeless tobacco: Never Used  Substance Use Topics  . Alcohol use: Never    Frequency: Never  . Drug use: Not Currently    Comment: used  meth and heroin in the past     Allergies   Ivp dye [iodinated diagnostic agents] and Tramadol   Review of Systems Review of Systems  Constitutional: Positive for fever.  Respiratory: Positive for cough, shortness of breath and wheezing.   Cardiovascular: Negative for chest pain.  Gastrointestinal: Positive for abdominal pain. Negative for vomiting.  Musculoskeletal: Positive for back pain.  Neurological: Negative for syncope.  All other systems reviewed and are negative.  Physical Exam Updated Vital Signs BP (!) 207/116 (BP Location: Left Arm)   Pulse (!) 126   Temp 98 F (36.7 C) (Oral)   Resp (!) 24   Ht 6' (1.829 m)   Wt 81.6 kg   SpO2 92%   BMI 24.41 kg/m   Physical Exam Vitals signs and nursing note reviewed.  Constitutional:        Appearance: He is well-developed. He is ill-appearing.  HENT:     Head: Normocephalic and atraumatic.  Eyes:     General:        Right eye: No discharge.        Left eye: No discharge.     Conjunctiva/sclera: Conjunctivae normal.  Neck:     Musculoskeletal: Neck supple.  Cardiovascular:     Rate and Rhythm: Regular rhythm. Tachycardia present.     Pulses:          Radial pulses are 2+ on the right side and 2+ on the left side.     Comments: 2+ symmetric radial pulses. Pulmonary:     Comments: Patient is tachypneic with poor air movement and biphasic wheezing throughout.  Breath sounds are coarse. Speaking in short sentences.  Chest:     Comments: Tender to palpation over the posterior chest wall which reproduces patient's back discomfort.  No overlying skin changes. Abdominal:     General: There is no distension.     Palpations: Abdomen is soft.     Tenderness: There is abdominal tenderness (generalized). There is no guarding or rebound.  Skin:    General: Skin is warm and dry.     Findings: No rash.  Neurological:     Mental Status: He is alert.  Psychiatric:        Behavior: Behavior normal.    ED Treatments / Results  Labs (all labs ordered are listed, but only abnormal results are displayed) Labs Reviewed  CBC WITH DIFFERENTIAL/PLATELET - Abnormal; Notable for the following components:      Result Value   WBC 11.0 (*)    Neutro Abs 8.9 (*)    All other components within normal limits  BASIC METABOLIC PANEL - Abnormal; Notable for the following components:   Glucose, Bld 175 (*)    All other components within normal limits  HEPATIC FUNCTION PANEL - Abnormal; Notable for the following components:   ALT 74 (*)    All other components within normal limits  BRAIN NATRIURETIC PEPTIDE - Abnormal; Notable for the following components:   B Natriuretic Peptide 221.1 (*)    All other components within normal limits  RAPID URINE DRUG SCREEN, HOSP PERFORMED - Abnormal;  Notable for the following components:   Opiates POSITIVE (*)    Amphetamines POSITIVE (*)    All other components within normal limits  URINALYSIS, ROUTINE W REFLEX MICROSCOPIC - Abnormal; Notable for the following components:   APPearance HAZY (*)    All other components within normal limits  LIPASE, BLOOD  TSH  I-STAT TROPONIN, ED  POCT I-STAT TROPONIN  I    EKG None  Radiology Dg Chest 2 View  Result Date: 06/16/2018 CLINICAL DATA:  Shortness of breath EXAM: CHEST - 2 VIEW COMPARISON:  06/14/2018 FINDINGS: Heart and mediastinal contours are within normal limits. No focal opacities or effusions. No acute bony abnormality. IMPRESSION: No active cardiopulmonary disease. Electronically Signed   By: Rolm Baptise M.D.   On: 06/16/2018 19:54   Ct Angio Chest/abd/pel For Dissection W And/or W/wo  Result Date: 06/16/2018 CLINICAL DATA:  58 year old male with shortness of breath, chest and back pain. Concern for aortic dissection. EXAM: CT ANGIOGRAPHY CHEST, ABDOMEN AND PELVIS TECHNIQUE: Multidetector CT imaging through the chest, abdomen and pelvis was performed using the standard protocol during bolus administration of intravenous contrast. Multiplanar reconstructed images and MIPs were obtained and reviewed to evaluate the vascular anatomy. CONTRAST:  183m ISOVUE-370 IOPAMIDOL (ISOVUE-370) INJECTION 76% COMPARISON:  Chest radiograph dated 06/16/2018 FINDINGS: CTA CHEST FINDINGS Cardiovascular: There is no cardiomegaly or pericardial effusion. Multi vessel coronary vascular calcification. Mild atherosclerotic calcification of the thoracic aorta. No aneurysmal dilatation or dissection. The visualized origins of the great vessels of the aortic arch appear patent. Evaluation of the pulmonary arteries is limited due to suboptimal opacification of the peripheral branches. No large or central pulmonary artery embolus. Mediastinum/Nodes: There is no hilar or mediastinal adenopathy. The esophagus and the  thyroid gland are grossly unremarkable. No mediastinal fluid collection. Lungs/Pleura: The lungs are clear. There is no pleural effusion or pneumothorax. The central airways are patent. Musculoskeletal: No chest wall abnormality. No acute or significant osseous findings. Review of the MIP images confirms the above findings. CTA ABDOMEN AND PELVIS FINDINGS VASCULAR Aorta: There is moderate atherosclerotic calcification of the abdominal aorta. No aneurysmal dilatation or dissection. Celiac: Patent without evidence of aneurysm, dissection, vasculitis or significant stenosis. SMA: Patent without evidence of aneurysm, dissection, vasculitis or significant stenosis. Renals: Both renal arteries are patent without evidence of aneurysm, dissection, vasculitis, fibromuscular dysplasia or significant stenosis. IMA: Patent without evidence of aneurysm, dissection, vasculitis or significant stenosis. Inflow: Moderate atherosclerotic calcification of the iliac arteries. The iliac arteries remain patent. No aneurysm or acute dissection. Veins: No obvious venous abnormality within the limitations of this arterial phase study. Review of the MIP images confirms the above findings. NON-VASCULAR No intra-abdominal free air or free fluid. Hepatobiliary: A 12 mm hypodense lesion in the inferior right lobe of the liver is not well characterized, likely a cyst. No intrahepatic biliary ductal dilatation. The gallbladder is unremarkable. Pancreas: Unremarkable. No pancreatic ductal dilatation or surrounding inflammatory changes. Spleen: Normal in size without focal abnormality. Adrenals/Urinary Tract: Adrenal glands are unremarkable. Kidneys are normal, without renal calculi, focal lesion, or hydronephrosis. Bladder is unremarkable. Stomach/Bowel: There is sigmoid diverticulosis without active inflammatory changes. There is no bowel obstruction or active inflammation. Normal appendix. Lymphatic: No significant vascular findings are present.  No enlarged abdominal or pelvic lymph nodes. Reproductive: The prostate and seminal vesicles are grossly unremarkable. No pelvic mass. Other: None Musculoskeletal: L4-L5 disc spacer. No acute osseous pathology. Review of the MIP images confirms the above findings. IMPRESSION: No acute intrathoracic, abdominal, or pelvic pathology. No aortic dissection or aneurysm. No CT evidence of central pulmonary artery embolus. Electronically Signed   By: AAnner CreteM.D.   On: 06/16/2018 23:44    Procedures Procedures (including critical care time)  CRITICAL CARE Performed by: SKennith Maes  Total critical care time: 45 minutes  Critical care time was exclusive of separately billable procedures and treating other  patients.  Critical care was necessary to treat or prevent imminent or life-threatening deterioration.  Critical care was time spent personally by me on the following activities: development of treatment plan with patient and/or surrogate as well as nursing, discussions with consultants, evaluation of patient's response to treatment, examination of patient, obtaining history from patient or surrogate, ordering and performing treatments and interventions, ordering and review of laboratory studies, ordering and review of radiographic studies, pulse oximetry and re-evaluation of patient's condition.   Medications Ordered in ED Medications  magnesium sulfate IVPB 2 g 50 mL (2 g Intravenous New Bag/Given 06/16/18 2217)  ipratropium (ATROVENT) nebulizer solution 0.5 mg (0.5 mg Nebulization Given 06/16/18 2052)  albuterol (PROVENTIL,VENTOLIN) solution continuous neb (10 mg/hr Nebulization Given 06/16/18 2052)  methylPREDNISolone sodium succinate (SOLU-MEDROL) 125 mg/2 mL injection 125 mg (125 mg Intravenous Given 06/16/18 2101)  morphine 4 MG/ML injection 4 mg (4 mg Intravenous Given 06/16/18 2058)  hydrALAZINE (APRESOLINE) injection 20 mg (20 mg Intravenous Given 06/16/18 2140)  lisinopril  (PRINIVIL,ZESTRIL) tablet 10 mg (10 mg Oral Given 06/16/18 2139)  morphine 4 MG/ML injection 4 mg (4 mg Intravenous Given 06/16/18 2214)  diphenhydrAMINE (BENADRYL) injection 50 mg (50 mg Intravenous Given 06/16/18 2214)     Initial Impression / Assessment and Plan / ED Course  I have reviewed the triage vital signs and the nursing notes.  Pertinent labs & imaging results that were available during my care of the patient were reviewed by me and considered in my medical decision making (see chart for details).  Patient with history of COPD, CAD as well as hypertension (not currently taking Lisinopril) who returns to the emergency department for worsening shortness of breath, wheezing, and cough.  He also has back and abdominal pain.  On exam he is tachycardic, hypertensive to 101B systolic, has increased work of breathing with poor air movement, coarse breath sounds, and biphasic wheeze throughout.  He has used his inhaler multiple times since being in the ER up in triage which is likely contributory to his tachycardia.  X-ray per triage is negative for infiltrate, effusion, edema, cardiomegaly, or pneumothorax.  We will proceed with labs to include troponin & BNP, and EKG.  Continuous albuterol, atrovent neb treatment, fluids.  Discussed with Dr. Vallery Ridge who has evaluated patient, has also ordered morphine for discomfort and Lisinorpil & hydralazine for blood pressure control.  Work-up thus far reviewed:  CBC: Leukocytosis @ 11. No anemia.  BMP: Hyperglycemia @ 175. Otherwise WNL. No significant electrolyte disturbance.  Hepatic function panel: mild ALT elevation @ 74- no prior on record for comparison. Bili WNL. Alk phos WNL Lipase: WNL Trop: Negative BNP: 221.1- mild elevation EKG: not crossing over in computer but has been performed, no STEMI  2150: RE-EVAL: Patient completed continuous neb, this helped while he was on it, now on 4L via [place], tachypneic, SpO2 hovering at 94%, remains with diffuse  wheezing. He appears mildly diaphoretic, uncomfortable, and anxious. Morphine initially helped, now back pain returned. BP improved to 140s/80s.  Discussed with Dr. Johnney Killian- will place patient on BiPAP she has recommended CT dissection study w/ his back pain and overall ill appearance- he has a reported hx of IV contrast dye allergy of seizures but this was with a myelogram and he has had prior cardiac catheterizations without seizure activity- presumably w/ contrast dye  Dr. Johnney Killian ultimately discussed CTA w/ radiologist Dr. Gerilyn Nestle. Will proceed w/ CTA per their discussion.   22:30: RE-EVAL: Patient w/ improved appearance on bipap, he  is resting comfortably.   23:50: RE-EVAL: Patient taken off BiPAP for CTA, returned resting comfortably on 4L via [place], maintaining SpO2 @ 97%, HR 105, will hold off on placing back on BiPAP at this time w/ his improvement.   CTA negative for dissection, aneurysm, evidence of central PE, or other acute pathology.   Plan for admission for COPD exacerbation.   This is a shared visit with supervising physician Dr. Johnney Killian who has independently evaluated patient & provided guidance in evaluation/management/disposition, in agreement with care   Vitals:   06/16/18 2330 06/17/18 0000  BP: (!) 149/83 (!) 146/90  Pulse: (!) 107 (!) 106  Resp: 15 16  Temp:    SpO2: 98% 96%    00:13: Patient signed out to [place] PA-C at change of shift pending hospitalist consultation for admission.   Final Clinical Impressions(s) / ED Diagnoses   Final diagnoses:  COPD exacerbation [facility])    ED Discharge Orders    None       Amaryllis Dyke, PA-C 06/17/18 0014    Charlesetta Shanks, MD 06/20/18 [phone]

## 2018-06-16 NOTE — ED Notes (Addendum)
Pt. Documented in error DG Chest 2 View. 

## 2018-06-16 NOTE — ED Provider Notes (Signed)
"  Francisco [name] is a 58 y.o. male with a hx of COPD, tobacco abuse, CAD, HTN, and hepatitis C who returns to the ER with complaints of continued dyspnea. Patient seen in the ER 06/14/18 for COPD exacerbation, ultimately was discharged home with prednisone taper and doxycycline.  He states since discharge he has been progressively worsening.  He states that his dyspnea, wheezing, and cough productive of yellow phlegm sputum have persisted.  He states that his back and abdomen are sore from coughing so much, primarily pain in the upper back.  He has been taking his medications from prior ER visit as prescribed, this includes having had 50 mg of prednisone today.  He has been utilizing his inhaler extremely frequently, several times per hour.  He is also had some subjective fevers.  He states his COPD has never been this bad.  Denies chest pain pain, emesis, or syncope. He has been out of his Lisinopril x 1 week. Patient denies EtOH use. 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  ED Course/Procedures   Clinical Course as of Feb 29 0858  Fri Jun 16, 2018  2209 Patient is looking improved on BiPAP.  He is alert and appropriate.  He can answer questions without difficulty.  He is more calm.  He has had morphine for thoracic back pain, and a continuous albuterol neb, hydralazine and his lisinopril which she has been out of for over a week.  Blood pressures are now 140s over 80s.  Patient still has some thoracic back pain but feels improved.   [MP]  2225 Have reviewed with radiology regarding proceeding with patient's dissection study.  Patient has questionable allergy which is a seizure after myelogram.  Patient has had cardiac catheterizations without  allergic reaction.  Patient has had prednisone this morning and yesterday.  He has been treating it with Solu-Medrol and Benadryl.  At this time for urgency of ruling out dissection due to thoracic pain do not feel that there is risk-benefit in favor of waiting 4 hours as per protocol for Solu-Medrol.  Have reviewed this with Dr. Andria Meuse and at this time will proceed with CT angiogram.   [MP]    Clinical Course User Index [MP] [name], MD    Procedures  MDM    58 year old male received a signout from PA Petrucelli pending CT dissection study and medical admission.  Please see her note for further work-up and medical decision making.  No evidence of dissection on CT.  No evidence of PE.  Consult to the hospitalist team and spoke with Dr. Toniann Fail who will accept the patient for admission for acute respiratory failure with hypoxia. The patient appears reasonably stabilized for admission considering the current resources, flow, and capabilities available in the ED at this time, and I doubt any other [facility] requiring further screening and/or treatment in the ED prior to admission.       Barkley Boards, PA-C 06/17/18 0858    Nira Conn, MD 06/18/18 [phone]

## 2018-06-16 NOTE — ED Triage Notes (Signed)
Patient c/o SOB with cough and congestion x4 days. Hx COPD. Seen x2 days ago for same.

## 2018-06-16 NOTE — ED Notes (Signed)
Patient called out stating increased shortness of breath. CN and EDPA at bedside.

## 2018-06-16 NOTE — ED Notes (Signed)
Pt took 3 puffs of his inhaler while in triage room.

## 2018-06-17 ENCOUNTER — Other Ambulatory Visit: Payer: Self-pay

## 2018-06-17 DIAGNOSIS — J9601 Acute respiratory failure with hypoxia: Secondary | ICD-10-CM | POA: Diagnosis present

## 2018-06-17 LAB — EXPECTORATED SPUTUM ASSESSMENT W GRAM STAIN, RFLX TO RESP C

## 2018-06-17 LAB — INFLUENZA PANEL BY PCR (TYPE A & B)
Influenza A By PCR: NEGATIVE
Influenza B By PCR: NEGATIVE

## 2018-06-17 MED ORDER — METHYLPREDNISOLONE SODIUM SUCC 40 MG IJ SOLR
40.0000 mg | Freq: Four times a day (QID) | INTRAMUSCULAR | Status: AC
  Administered 2018-06-17 – 2018-06-18 (×4): 40 mg via INTRAVENOUS
  Filled 2018-06-17 (×4): qty 1

## 2018-06-17 MED ORDER — IPRATROPIUM-ALBUTEROL 0.5-2.5 (3) MG/3ML IN SOLN
3.0000 mL | Freq: Four times a day (QID) | RESPIRATORY_TRACT | Status: DC
  Administered 2018-06-17 (×3): 3 mL via RESPIRATORY_TRACT
  Filled 2018-06-17 (×4): qty 3

## 2018-06-17 MED ORDER — SODIUM CHLORIDE 0.9 % IV SOLN
1.0000 g | INTRAVENOUS | Status: DC
  Administered 2018-06-17 – 2018-06-19 (×3): 1 g via INTRAVENOUS
  Filled 2018-06-17 (×3): qty 1

## 2018-06-17 MED ORDER — FUROSEMIDE 20 MG PO TABS
20.0000 mg | ORAL_TABLET | Freq: Once | ORAL | Status: AC
  Administered 2018-06-17: 20 mg via ORAL
  Filled 2018-06-17: qty 1

## 2018-06-17 MED ORDER — INFLUENZA VAC SPLIT QUAD 0.5 ML IM SUSY
0.5000 mL | PREFILLED_SYRINGE | Freq: Once | INTRAMUSCULAR | Status: AC
  Administered 2018-06-17: 0.5 mL via INTRAMUSCULAR
  Filled 2018-06-17: qty 0.5

## 2018-06-17 MED ORDER — BENZONATATE 100 MG PO CAPS
100.0000 mg | ORAL_CAPSULE | Freq: Three times a day (TID) | ORAL | Status: DC
  Administered 2018-06-17 – 2018-06-19 (×7): 100 mg via ORAL
  Filled 2018-06-17 (×7): qty 1

## 2018-06-17 MED ORDER — HYDROCODONE-HOMATROPINE 5-1.5 MG/5ML PO SYRP
5.0000 mL | ORAL_SOLUTION | ORAL | Status: DC | PRN
  Administered 2018-06-18: 5 mL via ORAL
  Filled 2018-06-17: qty 5

## 2018-06-17 MED ORDER — ENOXAPARIN SODIUM 40 MG/0.4ML ~~LOC~~ SOLN
40.0000 mg | SUBCUTANEOUS | Status: DC
  Administered 2018-06-17 – 2018-06-18 (×2): 40 mg via SUBCUTANEOUS
  Filled 2018-06-17 (×3): qty 0.4

## 2018-06-17 MED ORDER — AMLODIPINE BESYLATE 5 MG PO TABS
5.0000 mg | ORAL_TABLET | Freq: Every day | ORAL | Status: DC
  Administered 2018-06-17 – 2018-06-18 (×2): 5 mg via ORAL
  Filled 2018-06-17 (×2): qty 1

## 2018-06-17 MED ORDER — PREDNISONE 20 MG PO TABS
40.0000 mg | ORAL_TABLET | Freq: Every day | ORAL | Status: DC
  Administered 2018-06-18: 40 mg via ORAL
  Filled 2018-06-17: qty 2

## 2018-06-17 MED ORDER — METHOCARBAMOL 500 MG PO TABS
500.0000 mg | ORAL_TABLET | Freq: Three times a day (TID) | ORAL | Status: DC
  Administered 2018-06-17 – 2018-06-19 (×7): 500 mg via ORAL
  Filled 2018-06-17 (×7): qty 1

## 2018-06-17 MED ORDER — ASPIRIN EC 81 MG PO TBEC
81.0000 mg | DELAYED_RELEASE_TABLET | Freq: Every day | ORAL | Status: DC
  Administered 2018-06-17 – 2018-06-19 (×3): 81 mg via ORAL
  Filled 2018-06-17 (×3): qty 1

## 2018-06-17 MED ORDER — PNEUMOCOCCAL VAC POLYVALENT 25 MCG/0.5ML IJ INJ
0.5000 mL | INJECTION | Freq: Once | INTRAMUSCULAR | Status: AC
  Administered 2018-06-17: 0.5 mL via INTRAMUSCULAR
  Filled 2018-06-17: qty 0.5

## 2018-06-17 MED ORDER — NICOTINE 14 MG/24HR TD PT24
14.0000 mg | MEDICATED_PATCH | Freq: Every day | TRANSDERMAL | Status: DC
  Administered 2018-06-17 – 2018-06-19 (×3): 14 mg via TRANSDERMAL
  Filled 2018-06-17 (×3): qty 1

## 2018-06-17 MED ORDER — DM-GUAIFENESIN ER 30-600 MG PO TB12
1.0000 | ORAL_TABLET | Freq: Two times a day (BID) | ORAL | Status: DC
  Administered 2018-06-17 – 2018-06-19 (×5): 1 via ORAL
  Filled 2018-06-17 (×5): qty 1

## 2018-06-17 MED ORDER — METOPROLOL TARTRATE 5 MG/5ML IV SOLN
5.0000 mg | Freq: Once | INTRAVENOUS | Status: AC
  Administered 2018-06-17: 5 mg via INTRAVENOUS
  Filled 2018-06-17: qty 5

## 2018-06-17 MED ORDER — SODIUM CHLORIDE 0.9 % IV SOLN
INTRAVENOUS | Status: DC | PRN
  Administered 2018-06-17 – 2018-06-18 (×2): 250 mL via INTRAVENOUS

## 2018-06-17 NOTE — H&P (Signed)
[facility] History and Physical   Patient: Francisco [name] ERD:[id number]   PCP: Patient, No Pcp Per DOB: April 11, 1961   DOA: 06/16/2018   DOS: 06/17/2018   DOS: the patient was seen and examined on 06/16/2018  Patient coming from: The patient is coming from home  Chief Complaint: Shortness of breath and chest pain  HPI: Francisco [name] is a 58 y.o. male with Past medical history of COPD, HTN, hep C infection, CAD, substance abuse. Patient presents with complaints of this of breath and cough. He mentions that he recently moved to [place]. Had significantly worsening cough and shortness of breath as well as wheezing ongoing for last 3 to 4 days. Reports fevers but no chills. No diarrhea reported. Recently seen in the ER on 06/14/2018, comes to the hospital on 06/16/2018 with complaints of worsening symptoms despite being treated with steroids. Continues to smoke half pack a day. Use crystal meth 1 week ago. Denies any other IV drug abuse. No nausea at the time of my evaluation no vomiting.  No diarrhea reported.  No rash anywhere. Patient also complaining of chest pain as well as upper back pain. Sharp 10 out of 10 pain was reported.  At the time of my evaluation patient was chest pain-free. Patient ran out of his blood pressure medication a few weeks ago.  ED Course: Patient required BiPAP on arrival, received multiple breathing treatment despite which his wheezing was not improving and remain hypoxic and therefore patient was referred for admission.  At his baseline ambulates without any support And is independent for most of his ADL; manages his medication on his own.  Review of Systems: as mentioned in the history of present illness.  All other systems reviewed and are negative.  Past Medical History:  Diagnosis Date  . COPD (chronic obstructive pulmonary disease) (HCC)   . Coronary artery disease   . Hepatitis-C   . Hypertension   . MI (myocardial infarction) [facility])    Past  Surgical History:  Procedure Laterality Date  . BACK SURGERY    . NECK SURGERY    . stints     Social History:  reports that he has been smoking cigarettes. He has been smoking about 0.75 packs per day. He has never used smokeless tobacco. He reports previous drug use. He reports that he does not drink alcohol.  Allergies  Allergen Reactions  . Ivp Dye [Iodinated Diagnostic Agents] Other (See Comments)    seizures  . Tramadol Other (See Comments)    seizures    Family History  Problem Relation Age of Onset  . Rheum arthritis Mother      Prior to Admission medications   Medication Sig Start Date End Date Taking? Authorizing Provider  albuterol (PROVENTIL HFA;VENTOLIN HFA) 108 (90 Base) MCG/ACT inhaler Inhale 1-2 puffs into the lungs every 6 (six) hours as needed for wheezing or shortness of breath. 06/12/18  Yes Lorre Nick, MD  aspirin EC 81 MG tablet Take 81 mg by mouth daily.   Yes [provider]  lisinopril (PRINIVIL,ZESTRIL) 10 MG tablet Take 10 mg by mouth daily.   Yes [provider]  benzonatate (TESSALON) 100 MG capsule Take 1 capsule (100 mg total) by mouth every 8 (eight) hours. 06/14/18   Derwood Kaplan, MD  doxycycline (VIBRAMYCIN) 100 MG capsule Take 1 capsule (100 mg total) by mouth 2 (two) times daily. 06/14/18   Derwood Kaplan, MD  methylPREDNISolone (MEDROL DOSEPAK) 4 MG TBPK tablet Day 1: 24 mg (6 tablets).  Day 2: 20 mg (5 tablets).  Day 3: 16 mg (4 tablets). Day 4: 12 mg (3 tablets).  Day 5: 8 mg (2 tablets).  Day 6: 4 mg (1 tablet). 06/14/18   Bill Salinas, PA-C  predniSONE (DELTASONE) 50 MG tablet 1 p.o. daily x5 06/12/18   Lorre Nick, MD    Physical Exam: Vitals:   06/17/18 1237 06/17/18 1453 06/17/18 1557 06/17/18 1623  BP: (!) 152/90 (!) 143/86  (!) 154/94  Pulse: (!) 114 (!) 113  (!) 121  Resp: Temp: 97.8 F (36.6 C) 97.9 F (36.6 C)  98.3 F (36.8 C)  TempSrc: Oral Oral  Oral  SpO2: 94% 96% 96% 93%    Weight:      Height:        General: Alert, Awake and Oriented to Time, Place and Person. Appear in mild distress, affect appropriate Eyes: PERRL, Conjunctiva normal ENT: Oral Mucosa clear moist. Neck: no JVD, no Abnormal Mass Or lumps Cardiovascular: S1 and S2 Present, no Murmur, Peripheral Pulses Present Respiratory: normal respiratory effort, Bilateral Air entry equal and Decreased, no use of accessory muscle, bilateral faint Crackles, bilateral  wheezes Abdomen: Bowel Sound present, Soft and no tenderness, no hernia Skin: no redness, no Rash, no induration Extremities: no Pedal edema, no calf tenderness Neurologic: Grossly no focal neuro deficit. Bilaterally Equal motor strength  Labs on Admission:  CBC: Recent Labs  Lab 06/12/18 1944 06/14/18 1646 06/16/18 2021  WBC 8.8 8.9 11.0*  NEUTROABS  --  7.3 8.9*  HGB 14.5 14.8 14.7  HCT 44.2 45.2 45.9  MCV 97.8 100.0 99.6  PLT 272 313 358   Basic Metabolic Panel: Recent Labs  Lab 06/12/18 1944 06/14/18 1646 06/16/18 2021  NA 137 139 137  K 3.6 4.2 3.8  CL 103 105 99  CO2 GLUCOSE 102* 130* 175*  BUN CREATININE 0.74 0.77 0.69  CALCIUM 9.3 9.2 9.0   GFR: Estimated Creatinine Clearance: 111.8 mL/min (by C-G formula based on SCr of 0.69 mg/dL). Liver Function Tests: Recent Labs  Lab 06/16/18 2041  AST 23  ALT 74*  ALKPHOS 77  BILITOT 0.4  PROT 7.0  ALBUMIN 3.7   Recent Labs  Lab 06/16/18 2041  LIPASE 31   No results for input(s): AMMONIA in the last 168 hours. Coagulation Profile: No results for input(s): INR, PROTIME in the last 168 hours. Cardiac Enzymes: No results for input(s): CKTOTAL, CKMB, CKMBINDEX, TROPONINI in the last 168 hours. BNP (last 3 results) No results for input(s): PROBNP in the last 8760 hours. HbA1C: No results for input(s): HGBA1C in the last 72 hours. CBG: No results for input(s): GLUCAP in the last 168 hours. Lipid Profile: No results for input(s): CHOL,  HDL, LDLCALC, TRIG, CHOLHDL, LDLDIRECT in the last 72 hours. Thyroid Function Tests: Recent Labs    06/16/18 2156  TSH 0.443   Anemia Panel: No results for input(s): VITAMINB12, FOLATE, FERRITIN, TIBC, IRON, RETICCTPCT in the last 72 hours. Urine analysis:    Component Value Date/Time   COLORURINE YELLOW 06/16/2018 2222   APPEARANCEUR HAZY (A) 06/16/2018 2222   LABSPEC 1.017 06/16/2018 2222   PHURINE 7.0 06/16/2018 2222   GLUCOSEU NEGATIVE 06/16/2018 2222   HGBUR NEGATIVE 06/16/2018 2222   BILIRUBINUR NEGATIVE 06/16/2018 2222   KETONESUR NEGATIVE 06/16/2018 2222   PROTEINUR NEGATIVE 06/16/2018 2222   NITRITE NEGATIVE 06/16/2018 2222   LEUKOCYTESUR NEGATIVE 06/16/2018 2222  Radiological Exams on Admission: Dg Chest 2 View  Result Date: 06/16/2018 CLINICAL DATA:  Shortness of breath EXAM: CHEST - 2 VIEW COMPARISON:  06/14/2018 FINDINGS: Heart and mediastinal contours are within normal limits. No focal opacities or effusions. No acute bony abnormality. IMPRESSION: No active cardiopulmonary disease. Electronically Signed   By: Charlett Nose M.D.   On: 06/16/2018 19:54   Ct Angio Chest/abd/pel For Dissection W And/or W/wo  Result Date: 06/16/2018 CLINICAL DATA:  58 year old male with shortness of breath, chest and back pain. Concern for aortic dissection. EXAM: CT ANGIOGRAPHY CHEST, ABDOMEN AND PELVIS TECHNIQUE: Multidetector CT imaging through the chest, abdomen and pelvis was performed using the standard protocol during bolus administration of intravenous contrast. Multiplanar reconstructed images and MIPs were obtained and reviewed to evaluate the vascular anatomy. CONTRAST:  ISOVUE-370 IOPAMIDOL (ISOVUE-370) INJECTION 76% COMPARISON:  Chest radiograph dated 06/16/2018 FINDINGS: CTA CHEST FINDINGS Cardiovascular: There is no cardiomegaly or pericardial effusion. Multi vessel coronary vascular calcification. Mild atherosclerotic calcification of the thoracic aorta. No aneurysmal  dilatation or dissection. The visualized origins of the great vessels of the aortic arch appear patent. Evaluation of the pulmonary arteries is limited due to suboptimal opacification of the peripheral branches. No large or central pulmonary artery embolus. Mediastinum/Nodes: There is no hilar or mediastinal adenopathy. The esophagus and the thyroid gland are grossly unremarkable. No mediastinal fluid collection. Lungs/Pleura: The lungs are clear. There is no pleural effusion or pneumothorax. The central airways are patent. Musculoskeletal: No chest wall abnormality. No acute or significant osseous findings. Review of the MIP images confirms the above findings. CTA ABDOMEN AND PELVIS FINDINGS VASCULAR Aorta: There is moderate atherosclerotic calcification of the abdominal aorta. No aneurysmal dilatation or dissection. Celiac: Patent without evidence of aneurysm, dissection, vasculitis or significant stenosis. SMA: Patent without evidence of aneurysm, dissection, vasculitis or significant stenosis. Renals: Both renal arteries are patent without evidence of aneurysm, dissection, vasculitis, fibromuscular dysplasia or significant stenosis. IMA: Patent without evidence of aneurysm, dissection, vasculitis or significant stenosis. Inflow: Moderate atherosclerotic calcification of the iliac arteries. The iliac arteries remain patent. No aneurysm or acute dissection. Veins: No obvious venous abnormality within the limitations of this arterial phase study. Review of the MIP images confirms the above findings. NON-VASCULAR No intra-abdominal free air or free fluid. Hepatobiliary: A 12 mm hypodense lesion in the inferior right lobe of the liver is not well characterized, likely a cyst. No intrahepatic biliary ductal dilatation. The gallbladder is unremarkable. Pancreas: Unremarkable. No pancreatic ductal dilatation or surrounding inflammatory changes. Spleen: Normal in size without focal abnormality. Adrenals/Urinary Tract:  Adrenal glands are unremarkable. Kidneys are normal, without renal calculi, focal lesion, or hydronephrosis. Bladder is unremarkable. Stomach/Bowel: There is sigmoid diverticulosis without active inflammatory changes. There is no bowel obstruction or active inflammation. Normal appendix. Lymphatic: No significant vascular findings are present. No enlarged abdominal or pelvic lymph nodes. Reproductive: The prostate and seminal vesicles are grossly unremarkable. No pelvic mass. Other: None Musculoskeletal: L4-L5 disc spacer. No acute osseous pathology. Review of the MIP images confirms the above findings. IMPRESSION: No acute intrathoracic, abdominal, or pelvic pathology. No aortic dissection or aneurysm. No CT evidence of central pulmonary artery embolus. Electronically Signed   By: Elgie Collard M.D.   On: 06/16/2018 23:44    Assessment/Plan 1.  COPD exacerbation. Acute respiratory failure with hypoxia requiring BiPAP Influenza PCR is negative. Bilateral expiratory wheezing on examination. Mild hypoxia also noted. We will admit to the hospital, continue with steroids, duo nebs, antibiotic and  monitor. Mucinex Tessalon Perles and other supportive measures.  2.  Chest pain. [name] did have some chest pain as well as shoulder pain on examination. Was hypotensive on admission as well per CT scan of the chest was performed with dissection protocol which was negative for any acute abnormality. Negative for PE as well. Monitor.  3.  Essential hypertension. Sinus tachycardia. Blood pressure significant elevated heart rate significant elevated. Continue to monitor on telemetry. Holding lisinopril.  Switch to Norvasc.  4.  Active smoker. Counseled for quit smoking. Patient is currently willing. Smokes half pack a day. At his baseline he used to smoke more than a pack a day. Nicotine patch ordered.   Nutrition: Cardiac diet DVT Prophylaxis: subcutaneous Heparin  Advance goals of care  discussion: full code   Consults: none  Family Communication: no family was present at bedside, at the time of interview.  Disposition: Admitted as observation, telemetry unit. Likely to be discharged home, in tomrorow.  Author: Lynden Oxford, MD [facility] 06/17/2018  To reach On-call, see care teams to locate the attending and reach out to them via [email or web address]. If 7PM-7AM, please contact night-coverage If you still have difficulty reaching the attending provider, please page the [facility] (Director on Call) for [facility] on amion for assistance.

## 2018-06-17 NOTE — Evaluation (Signed)
Physical Therapy Evaluation Patient Details Name: Francisco [name] MRN: [id number] DOB: 1960/10/09 Today's Date: 06/17/2018   History of Present Illness  58 YO male returned to ED with SOB., H/O COPD, CAD,HTN, HEP C   Clinical Impression  Patient independent in mobility. Increased DOE, productive coughing. HR 126. Saturation >93% x 120'.  No further PT needs.    Follow Up Recommendations No PT follow up    Equipment Recommendations  None recommended by PT    Recommendations for Other Services       Precautions / Restrictions Precautions Precautions: None      Mobility  Bed Mobility Overal bed mobility: Independent                Transfers Overall transfer level: Independent                  Ambulation/Gait Ambulation/Gait assistance: Independent Gait Distance (Feet): 150 Feet Assistive device: None Gait Pattern/deviations: WFL(Within Functional Limits)     General Gait Details: HR 126, Sats lowest 93% on RA  Stairs            Wheelchair Mobility    Modified Rankin (Stroke Patients Only)       Balance                                             Pertinent Vitals/Pain Pain Assessment: No/denies pain    Home Living Family/patient expects to be discharged to:: Private residence Living Arrangements: Alone   Type of Home: House Home Access: Stairs to enter   Secretary/administrator of Steps: 15   Home Equipment: None      Prior Function Level of Independence: Independent               Hand Dominance        Extremity/Trunk Assessment   Upper Extremity Assessment Upper Extremity Assessment: Overall WFL for tasks assessed    Lower Extremity Assessment Lower Extremity Assessment: Overall WFL for tasks assessed    Cervical / Trunk Assessment Cervical / Trunk Assessment: Normal  Communication   Communication: No difficulties  Cognition Arousal/Alertness: Awake/alert Behavior During Therapy: WFL for  tasks assessed/performed Overall Cognitive Status: Within Functional Limits for tasks assessed                                        General Comments      Exercises     Assessment/Plan    PT Assessment Patent does not need any further PT services  PT Problem List         PT Treatment Interventions      PT Goals (Current goals can be found in the Care Plan section)  Acute Rehab PT Goals Patient Stated Goal: go back to work, breathe PT Goal Formulation: All assessment and education complete, DC therapy    Frequency     Barriers to discharge        Co-evaluation               AM-PAC PT "6 Clicks" Mobility  Outcome Measure Help needed turning from your back to your side while in a flat bed without using bedrails?: None Help needed moving from lying on your back to sitting on the side of a flat bed without using bedrails?:  None Help needed moving to and from a bed to a chair (including a wheelchair)?: None Help needed standing up from a chair using your arms (e.g., wheelchair or bedside chair)?: None Help needed to walk in hospital room?: None Help needed climbing 3-5 steps with a railing? : None 6 Click Score: 24    End of Session   Activity Tolerance: Patient tolerated treatment well Patient left: (in BR) Nurse Communication: Mobility status PT Visit Diagnosis: Other (comment)    Time: 0981-1914 PT Time Calculation (min) (ACUTE ONLY): 24 min   Charges:   PT Evaluation $PT Eval Low Complexity: 1 Low          Blanchard Kelch PT Acute Rehabilitation Services Pager [phone] Office [phone]   Rada Hay 06/17/2018, 11:44 AM

## 2018-06-17 NOTE — Progress Notes (Signed)
SATURATION QUALIFICATIONS: (This note is used to comply with regulatory documentation for home oxygen)  Patient Saturations on Room Air at Rest = 96%  Patient Saturations on Room Air while Ambulating = 93%  Patient Saturations on  Liters of oxygen while Ambulating = % NA Please briefly explain why patient needs home oxygen:None required based on walk test.Ayren Zumbro [facility] PT Acute Rehabilitation Services Pager [phone] Office [phone]

## 2018-06-17 NOTE — Progress Notes (Signed)
OT Cancellation Note  Patient Details Name: Francisco [name] MRN: [id number] DOB: 02-28-1961   Cancelled Treatment:    Reason Eval/Treat Not Completed: OT screened, no needs identified, will sign off Per PT, pt completing BADL independently, no OT evaluation indicated at this time. OT will sign off, please reconsult if changes arise.  Dalphine Handing, MSOT, OTR/L Behavioral Health OT/ Acute Relief OT [facility] Office: [phone]  Dalphine Handing 06/17/2018, 3:26 PM

## 2018-06-17 NOTE — ED Notes (Signed)
ED TO INPATIENT HANDOFF REPORT  Name/Age/Gender Francisco [name] 58 y.o. male  Code Status   Home/SNF/Other Home  Chief Complaint SOB/COPD-Trouble Breathing  Level of Care/Admitting Diagnosis ED Disposition    ED Disposition Condition Comment   Admit  Hospital Area: [facility] [[id number]]  Level of Care: Telemetry [5]  Admit to tele based on following criteria: Monitor for Ischemic changes  Diagnosis: Acute respiratory failure with hypoxia [facility]) [[id number]]  Admitting Physician: Eduard Clos [phone]  Attending Physician: Eduard Clos Florian.Pax  PT Class (Do Not Modify): Observation [104]  PT Acc Code (Do Not Modify): Observation [10022]       Medical History Past Medical History:  Diagnosis Date  . COPD (chronic obstructive pulmonary disease) (HCC)   . Coronary artery disease   . Hepatitis-C   . Hypertension   . MI (myocardial infarction) (HCC)     Allergies Allergies  Allergen Reactions  . Ivp Dye [Iodinated Diagnostic Agents] Other (See Comments)    seizures  . Tramadol Other (See Comments)    seizures    IV Location/Drains/Wounds Patient Lines/Drains/Airways Status   Active Line/Drains/Airways    Name:   Placement date:   Placement time:   Site:   Days:   Peripheral IV 06/16/18 Right Forearm   06/16/18    2038    Forearm   1          Labs/Imaging Results for orders placed or performed during the hospital encounter of 06/16/18 (from the past 48 hour(s))  CBC with Differential     Status: Abnormal   Collection Time: 06/16/18  8:21 PM  Result Value Ref Range   WBC 11.0 (H) 4.0 - 10.5 K/uL   RBC 4.61 4.22 - 5.81 MIL/uL   Hemoglobin 14.7 13.0 - 17.0 g/dL   HCT 70.2 63.7 - 85.8 %   MCV 99.6 80.0 - 100.0 fL   MCH 31.9 26.0 - 34.0 pg   MCHC 32.0 30.0 - 36.0 g/dL   RDW 85.0 27.7 - 41.2 %   Platelets 358 150 - 400 K/uL   nRBC 0.0 0.0 - 0.2 %   Neutrophils Relative % 81 %   Neutro Abs 8.9 (H) 1.7 - 7.7 K/uL   Lymphocytes  Relative 10 %   Lymphs Abs 1.1 0.7 - 4.0 K/uL   Monocytes Relative 7 %   Monocytes Absolute 0.8 0.1 - 1.0 K/uL   Eosinophils Relative 1 %   Eosinophils Absolute 0.1 0.0 - 0.5 K/uL   Basophils Relative 0 %   Basophils Absolute 0.0 0.0 - 0.1 K/uL   Immature Granulocytes 1 %   Abs Immature Granulocytes 0.07 0.00 - 0.07 K/uL    Comment: Performed at [facility], [address], [place], [place] [address]  Basic metabolic panel     Status: Abnormal   Collection Time: 06/16/18  8:21 PM  Result Value Ref Range   Sodium 137 135 - 145 mmol/L   Potassium 3.8 3.5 - 5.1 mmol/L   Chloride 99 98 - 111 mmol/L   CO2 26 22 - 32 mmol/L   Glucose, Bld 175 (H) 70 - 99 mg/dL   BUN 12 6 - 20 mg/dL   Creatinine, Ser 6.72 0.61 - 1.24 mg/dL   Calcium 9.0 8.9 - 09.4 mg/dL   GFR calc non Af Amer >60 >60 mL/min   GFR calc Af Amer >60 >60 mL/min   Anion gap 12 5 - 15    Comment: Performed at [facility], [address], [place], [place] [address]  Hepatic function panel     Status: Abnormal   Collection Time: 06/16/18  8:41 PM  Result Value Ref Range   Total Protein 7.0 6.5 - 8.1 g/dL   Albumin 3.7 3.5 - 5.0 g/dL   AST 23 15 - 41 U/L   ALT 74 (H) 0 - 44 U/L   Alkaline Phosphatase 77 38 - 126 U/L   Total Bilirubin 0.4 0.3 - 1.2 mg/dL   Bilirubin, Direct 0.1 0.0 - 0.2 mg/dL   Indirect Bilirubin 0.3 0.3 - 0.9 mg/dL    Comment: Performed at [facility], [address] [place], [place] [address]  Lipase, blood     Status: None   Collection Time: 06/16/18  8:41 PM  Result Value Ref Range   Lipase 31 11 - 51 U/L    Comment: Performed at [facility], [address], [place], [place] [address]  Brain natriuretic peptide     Status: Abnormal   Collection Time: 06/16/18  8:42 PM  Result Value Ref Range   B Natriuretic Peptide 221.1 (H) 0.0 - 100.0 pg/mL    Comment: Performed at [facility], [address], [place], [place] [address]  POCT i-Stat troponin I     Status: None   Collection Time: 06/16/18  8:54 PM  Result Value Ref Range   Troponin i, poc 0.01 0.00 - 0.08 ng/mL   Comment 3            Comment: Due to the release kinetics of cTnI, a negative result within the first hours of the onset of symptoms does not rule out myocardial infarction with certainty. If myocardial infarction is still suspected, repeat the test at appropriate intervals.   TSH     Status: None   Collection Time: 06/16/18  9:56 PM  Result Value Ref Range   TSH 0.443 0.350 - 4.500 uIU/mL    Comment: Performed by a 3rd Generation assay with a functional sensitivity of <=0.01 uIU/mL. Performed at [facility], [address] [place], [place] [address]   Urine rapid drug screen (hosp performed)     Status: Abnormal   Collection Time: 06/16/18 10:22 PM  Result Value Ref Range   Opiates POSITIVE (A) NONE DETECTED   Cocaine NONE DETECTED NONE DETECTED   Benzodiazepines NONE DETECTED NONE DETECTED   Amphetamines POSITIVE (A) NONE DETECTED   Tetrahydrocannabinol NONE DETECTED NONE DETECTED   Barbiturates NONE DETECTED NONE DETECTED    Comment: (NOTE) DRUG SCREEN FOR MEDICAL PURPOSES ONLY.  IF CONFIRMATION IS NEEDED FOR ANY PURPOSE, NOTIFY LAB WITHIN 5 DAYS. LOWEST DETECTABLE LIMITS FOR URINE DRUG SCREEN Drug Class                     Cutoff (ng/mL) Amphetamine and metabolites    1000 Barbiturate and metabolites    200 Benzodiazepine                 200 Tricyclics and metabolites     300 Opiates and metabolites        300 Cocaine and metabolites        300 THC                            50 Performed at [facility], [address], [place], [place] [address]   Urinalysis, Routine w  reflex microscopic     Status: Abnormal   Collection Time: 06/16/18 10:22 PM  Result Value Ref Range   Color, Urine YELLOW YELLOW   APPearance HAZY (A) CLEAR   Specific Gravity, Urine  1.017 1.005 - 1.030   pH 7.0 5.0 - 8.0   Glucose, UA NEGATIVE NEGATIVE mg/dL   Hgb urine dipstick NEGATIVE NEGATIVE   Bilirubin Urine NEGATIVE NEGATIVE   Ketones, ur NEGATIVE NEGATIVE mg/dL   Protein, ur NEGATIVE NEGATIVE mg/dL   Nitrite NEGATIVE NEGATIVE   Leukocytes,Ua NEGATIVE NEGATIVE    Comment: Performed at [facility], [address], [place], [place] [address]   Dg Chest 2 View  Result Date: 06/16/2018 CLINICAL DATA:  Shortness of breath EXAM: CHEST - 2 VIEW COMPARISON:  06/14/2018 FINDINGS: Heart and mediastinal contours are within normal limits. No focal opacities or effusions. No acute bony abnormality. IMPRESSION: No active cardiopulmonary disease. Electronically Signed   By: Charlett Nose M.D.   On: 06/16/2018 19:54   Ct Angio Chest/abd/pel For Dissection W And/or W/wo  Result Date: 06/16/2018 CLINICAL DATA:  58 year old male with shortness of breath, chest and back pain. Concern for aortic dissection. EXAM: CT ANGIOGRAPHY CHEST, ABDOMEN AND PELVIS TECHNIQUE: Multidetector CT imaging through the chest, abdomen and pelvis was performed using the standard protocol during bolus administration of intravenous contrast. Multiplanar reconstructed images and MIPs were obtained and reviewed to evaluate the vascular anatomy. CONTRAST:  ISOVUE-370 IOPAMIDOL (ISOVUE-370) INJECTION 76% COMPARISON:  Chest radiograph dated 06/16/2018 FINDINGS: CTA CHEST FINDINGS Cardiovascular: There is no cardiomegaly or pericardial effusion. Multi vessel coronary vascular calcification. Mild atherosclerotic calcification of the thoracic aorta. No aneurysmal dilatation or dissection. The visualized origins of the great vessels of the aortic arch appear patent. Evaluation of the pulmonary arteries is limited due to suboptimal opacification of the peripheral branches. No large or central pulmonary artery embolus. Mediastinum/Nodes: There is no hilar or mediastinal adenopathy. The esophagus and  the thyroid gland are grossly unremarkable. No mediastinal fluid collection. Lungs/Pleura: The lungs are clear. There is no pleural effusion or pneumothorax. The central airways are patent. Musculoskeletal: No chest wall abnormality. No acute or significant osseous findings. Review of the MIP images confirms the above findings. CTA ABDOMEN AND PELVIS FINDINGS VASCULAR Aorta: There is moderate atherosclerotic calcification of the abdominal aorta. No aneurysmal dilatation or dissection. Celiac: Patent without evidence of aneurysm, dissection, vasculitis or significant stenosis. SMA: Patent without evidence of aneurysm, dissection, vasculitis or significant stenosis. Renals: Both renal arteries are patent without evidence of aneurysm, dissection, vasculitis, fibromuscular dysplasia or significant stenosis. IMA: Patent without evidence of aneurysm, dissection, vasculitis or significant stenosis. Inflow: Moderate atherosclerotic calcification of the iliac arteries. The iliac arteries remain patent. No aneurysm or acute dissection. Veins: No obvious venous abnormality within the limitations of this arterial phase study. Review of the MIP images confirms the above findings. NON-VASCULAR No intra-abdominal free air or free fluid. Hepatobiliary: A 12 mm hypodense lesion in the inferior right lobe of the liver is not well characterized, likely a cyst. No intrahepatic biliary ductal dilatation. The gallbladder is unremarkable. Pancreas: Unremarkable. No pancreatic ductal dilatation or surrounding inflammatory changes. Spleen: Normal in size without focal abnormality. Adrenals/Urinary Tract: Adrenal glands are unremarkable. Kidneys are normal, without renal calculi, focal lesion, or hydronephrosis. Bladder is unremarkable. Stomach/Bowel: There is sigmoid diverticulosis without active inflammatory changes. There is no bowel obstruction or active inflammation. Normal appendix. Lymphatic: No significant vascular findings are  present. No enlarged abdominal or pelvic lymph  nodes. Reproductive: The prostate and seminal vesicles are grossly unremarkable. No pelvic mass. Other: None Musculoskeletal: L4-L5 disc spacer. No acute osseous pathology. Review of the MIP images confirms the above findings. IMPRESSION: No acute intrathoracic, abdominal, or pelvic pathology. No aortic dissection or aneurysm. No CT evidence of central pulmonary artery embolus. Electronically Signed   By: Elgie Collard M.D.   On: 06/16/2018 23:44   None  Pending Labs Unresulted Labs (From admission, onward)   None      Vitals/Pain Today's Vitals   06/17/18 0000 06/17/18 0013 06/17/18 0030 06/17/18 0100  BP: (!) 146/90  (!) 149/86 131/86  Pulse: (!) 106  (!) 110 (!) 107  Resp: Temp:      TempSrc:      SpO2: 96%  97% 97%  Weight:      Height:      PainSc:  Asleep      Isolation Precautions No active isolations  Medications Medications  sodium chloride (PF) 0.9 % injection (has no administration in time range)  ipratropium (ATROVENT) nebulizer solution 0.5 mg (0.5 mg Nebulization Given 06/16/18 2052)  albuterol (PROVENTIL,VENTOLIN) solution continuous neb (10 mg/hr Nebulization Given 06/16/18 2052)  methylPREDNISolone sodium succinate (SOLU-MEDROL) 125 mg/2 mL injection 125 mg (125 mg Intravenous Given 06/16/18 2101)  morphine 4 MG/ML injection 4 mg (4 mg Intravenous Given 06/16/18 2058)  hydrALAZINE (APRESOLINE) injection 20 mg (20 mg Intravenous Given 06/16/18 2140)  lisinopril (PRINIVIL,ZESTRIL) tablet 10 mg (10 mg Oral Given 06/16/18 2139)  morphine 4 MG/ML injection 4 mg (4 mg Intravenous Given 06/16/18 2214)  magnesium sulfate IVPB 2 g 50 mL ( Intravenous Stopped 06/16/18 2322)  diphenhydrAMINE (BENADRYL) injection 50 mg (50 mg Intravenous Given 06/16/18 2214)  iopamidol (ISOVUE-370) 76 % injection 100 mL (100 mLs Intravenous Contrast Given 06/16/18 2311)    Mobility walks

## 2018-06-18 DIAGNOSIS — J441 Chronic obstructive pulmonary disease with (acute) exacerbation: Secondary | ICD-10-CM

## 2018-06-18 LAB — CBC
HCT: 46 % (ref 39.0–52.0)
Hemoglobin: 15 g/dL (ref 13.0–17.0)
MCH: 32.4 pg (ref 26.0–34.0)
MCHC: 32.6 g/dL (ref 30.0–36.0)
MCV: 99.4 fL (ref 80.0–100.0)
Platelets: 366 10*3/uL (ref 150–400)
RBC: 4.63 MIL/uL (ref 4.22–5.81)
RDW: 13.7 % (ref 11.5–15.5)
WBC: 18 10*3/uL — ABNORMAL HIGH (ref 4.0–10.5)
nRBC: 0 % (ref 0.0–0.2)

## 2018-06-18 LAB — COMPREHENSIVE METABOLIC PANEL
ALT: 54 U/L — ABNORMAL HIGH (ref 0–44)
AST: 19 U/L (ref 15–41)
Albumin: 3.6 g/dL (ref 3.5–5.0)
Alkaline Phosphatase: 81 U/L (ref 38–126)
Anion gap: 8 (ref 5–15)
BUN: 20 mg/dL (ref 6–20)
CHLORIDE: 102 mmol/L (ref 98–111)
CO2: 27 mmol/L (ref 22–32)
Calcium: 9.1 mg/dL (ref 8.9–10.3)
Creatinine, Ser: 0.73 mg/dL (ref 0.61–1.24)
GFR calc Af Amer: >60 mL/min (ref >60)
GFR calc non Af Amer: >60 mL/min (ref >60)
Glucose, Bld: 132 mg/dL — ABNORMAL HIGH (ref 70–99)
Potassium: 4 mmol/L (ref 3.5–5.1)
Sodium: 137 mmol/L (ref 135–145)
Total Bilirubin: 0.7 mg/dL (ref 0.3–1.2)
Total Protein: 6.9 g/dL (ref 6.5–8.1)

## 2018-06-18 LAB — HIV ANTIBODY (ROUTINE TESTING W REFLEX): HIV Screen 4th Generation wRfx: NONREACTIVE

## 2018-06-18 MED ORDER — ALBUTEROL SULFATE (2.5 MG/3ML) 0.083% IN NEBU
2.5000 mg | INHALATION_SOLUTION | RESPIRATORY_TRACT | Status: DC | PRN
  Administered 2018-06-18 – 2018-06-19 (×2): 2.5 mg via RESPIRATORY_TRACT
  Filled 2018-06-18 (×2): qty 3

## 2018-06-18 MED ORDER — METHYLPREDNISOLONE SODIUM SUCC 125 MG IJ SOLR
60.0000 mg | Freq: Four times a day (QID) | INTRAMUSCULAR | Status: DC
  Administered 2018-06-18 – 2018-06-19 (×5): 60 mg via INTRAVENOUS
  Filled 2018-06-18 (×5): qty 2

## 2018-06-18 MED ORDER — HYDRALAZINE HCL 20 MG/ML IJ SOLN
10.0000 mg | Freq: Four times a day (QID) | INTRAMUSCULAR | Status: DC | PRN
  Administered 2018-06-18 – 2018-06-19 (×2): 10 mg via INTRAVENOUS
  Filled 2018-06-18 (×2): qty 1

## 2018-06-18 MED ORDER — IPRATROPIUM-ALBUTEROL 0.5-2.5 (3) MG/3ML IN SOLN
3.0000 mL | Freq: Three times a day (TID) | RESPIRATORY_TRACT | Status: DC
  Administered 2018-06-18 – 2018-06-19 (×4): 3 mL via RESPIRATORY_TRACT
  Filled 2018-06-18 (×4): qty 3

## 2018-06-18 MED ORDER — LABETALOL HCL 5 MG/ML IV SOLN
10.0000 mg | Freq: Once | INTRAVENOUS | Status: AC
  Administered 2018-06-18: 10 mg via INTRAVENOUS
  Filled 2018-06-18 (×2): qty 4

## 2018-06-18 MED ORDER — AMLODIPINE BESYLATE 5 MG PO TABS
5.0000 mg | ORAL_TABLET | Freq: Once | ORAL | Status: AC
  Administered 2018-06-18: 5 mg via ORAL
  Filled 2018-06-18: qty 1

## 2018-06-18 MED ORDER — AMLODIPINE BESYLATE 10 MG PO TABS
10.0000 mg | ORAL_TABLET | Freq: Every day | ORAL | Status: DC
  Administered 2018-06-19: 10 mg via ORAL
  Filled 2018-06-18: qty 1

## 2018-06-18 MED ORDER — ACETAMINOPHEN 500 MG PO TABS
1000.0000 mg | ORAL_TABLET | Freq: Once | ORAL | Status: AC
  Administered 2018-06-18: 1000 mg via ORAL
  Filled 2018-06-18: qty 2

## 2018-06-18 NOTE — Progress Notes (Signed)
Nutrition Brief Note  RD consulted via COPD protocol.  Patient currently consuming 100% of meals. Pt reports no changes in appetite.  Wt Readings from Last 15 Encounters:  06/17/18 79.6 kg  06/14/18 81.6 kg  06/12/18 86.2 kg    Body mass index is 23.8 kg/m. Patient meets criteria for normal based on current BMI.   Current diet order is heart healthy, patient is consuming approximately 100% of meals at this time. Labs and medications reviewed.   No nutrition interventions warranted at this time. If nutrition issues arise, please consult RD.   Tilda Franco, MS, RD, LDN [facility] Inpatient Clinical Dietitian Pager: [phone] After Hours Pager: [phone]

## 2018-06-18 NOTE — Progress Notes (Signed)
PROGRESS NOTE    Francisco [name]  IOM:[id number] DOB: 1960/11/18 DOA: 06/16/2018 PCP: Patient, No Pcp Per    Brief Narrative:  Francisco [name] is a 58 y.o. male with Past medical history of COPD, HTN, hep C infection, CAD, substance abuse. Patient presents with complaints of this of breath and cough. He was admitted for acute COPD exacerbation.  Patient required BiPAP on arrival, received multiple breathing treatment despite which his wheezing was not improving and remain hypoxic and therefore patient was referred for admission  Assessment & Plan:   Active Problems:   Acute respiratory failure with hypoxia (HCC)   Acute respiratory failure with hypoxia secondary to acute COPD exacerbation:  Continues to have bilaterl exp wheezing anteriorly and posteriorly.  Would increase the IV steroids to 60 q6 hours , antibiotics and duonebs.  [place] oxygen to keep sats greater than 90%.  Sputum cultures ordered.    Chest pain resolved.     Hypertension:  Not well controlled.  Increased the norvasc to 10 mg daily.    Tobacco abuse: Nicotine patch.        DVT prophylaxis: (lovenox.  Code Status: FULL CODE.  Family Communication: none at bedside.  Disposition Plan: pending clinical improvement.    Consultants:   None.    Procedures: none.    Antimicrobials: rocephin.    Subjective: Reports sob, cough and wheezing.  Wants to take a bath.   Objective: Vitals:   06/18/18 1015 06/18/18 1340 06/18/18 1431 06/18/18 1654  BP: (!) 143/102  (!) 161/95 (!) 153/88  Pulse: 98  (!) 116 (!) 106  Resp:    18  Temp:   98.5 F (36.9 C)   TempSrc:   Oral   SpO2:  96% 94% 96%  Weight:      Height:        Intake/Output Summary (Last 24 hours) at 06/18/2018 1834 Last data filed at 06/18/2018 0900 Gross per 24 hour  Intake 540 ml  Output -  Net 540 ml   Filed Weights   06/16/18 1925 06/17/18 0206  Weight: 81.6 kg 79.6 kg    Examination:  General exam: appears flushed and in mild  discomfort from coughing , still requiring upto 2 lit of [place] oxygen.  Respiratory system:bilateral wheezing heard both anteriorly and posteriorly. Tachypnea.  Cardiovascular system: S1 & S2 heard, RRR.  No pedal edema. Gastrointestinal system: Abdomen is nondistended, soft and nontender. No organomegaly or masses felt. Normal bowel sounds heard. Central nervous system: Alert and oriented. No focal neurological deficits. Extremities: Symmetric 5 x 5 power. Skin: No rashes, lesions or ulcers Psychiatry:  Mood & affect appropriate.     Data Reviewed: I have personally reviewed following labs and imaging studies  CBC: Recent Labs  Lab 06/12/18 1944 06/14/18 1646 06/16/18 2021 06/18/18 0501  WBC 8.8 8.9 11.0* 18.0*  NEUTROABS  --  7.3 8.9*  --   HGB 14.5 14.8 14.7 15.0  HCT 44.2 45.2 45.9 46.0  MCV 97.8 100.0 99.6 99.4  PLT 272 313 358 366   Basic Metabolic Panel: Recent Labs  Lab 06/12/18 1944 06/14/18 1646 06/16/18 2021 06/18/18 0501  NA 137 139 137 137  K 3.6 4.2 3.8 4.0  CL 103 105 99 102  CO2 25 23 26 27   GLUCOSE 102* 130* 175* 132*  BUN 9 13 12 20   CREATININE 0.74 0.77 0.69 0.73  CALCIUM 9.3 9.2 9.0 9.1   GFR: Estimated Creatinine Clearance: 111.8 mL/min (by C-G formula based on SCr  of 0.73 mg/dL). Liver Function Tests: Recent Labs  Lab 06/16/18 2041 06/18/18 0501  AST 23 19  ALT 74* 54*  ALKPHOS 77 81  BILITOT 0.4 0.7  PROT 7.0 6.9  ALBUMIN 3.7 3.6   Recent Labs  Lab 06/16/18 2041  LIPASE 31   No results for input(s): AMMONIA in the last 168 hours. Coagulation Profile: No results for input(s): INR, PROTIME in the last 168 hours. Cardiac Enzymes: No results for input(s): CKTOTAL, CKMB, CKMBINDEX, TROPONINI in the last 168 hours. BNP (last 3 results) No results for input(s): PROBNP in the last 8760 hours. HbA1C: No results for input(s): HGBA1C in the last 72 hours. CBG: No results for input(s): GLUCAP in the last 168 hours. Lipid Profile: No  results for input(s): CHOL, HDL, LDLCALC, TRIG, CHOLHDL, LDLDIRECT in the last 72 hours. Thyroid Function Tests: Recent Labs    06/16/18 2156  TSH 0.443   Anemia Panel: No results for input(s): VITAMINB12, FOLATE, FERRITIN, TIBC, IRON, RETICCTPCT in the last 72 hours. Sepsis Labs: No results for input(s): PROCALCITON, LATICACIDVEN in the last 168 hours.  Recent Results (from the past 240 hour(s))  Culture, sputum-assessment     Status: None   Collection Time: 06/17/18  8:22 AM  Result Value Ref Range Status   Specimen Description SPUTUM  Final   Special Requests NONE  Final   Sputum evaluation   Final    THIS SPECIMEN IS ACCEPTABLE FOR SPUTUM CULTURE Performed at [facility], [address], [place], [place] [address]    Report Status 06/17/2018 FINAL  Final  Culture, respiratory     Status: None (Preliminary result)   Collection Time: 06/17/18  8:22 AM  Result Value Ref Range Status   Specimen Description   Final    SPUTUM Performed at [facility], [address], [place], [place] [address]    Special Requests   Final    NONE Reflexed from [id number] Performed at [facility], [address], [place], [place] [address]    Gram Stain   Final    MODERATE WBC PRESENT, PREDOMINANTLY PMN RARE SQUAMOUS EPITHELIAL CELLS PRESENT ABUNDANT GRAM NEGATIVE COCCI IN PAIRS RARE GRAM POSITIVE COCCI IN PAIRS    Culture   Final    CULTURE REINCUBATED FOR BETTER GROWTH Performed at [facility], [address] [place], [place] [address]    Report Status PENDING  Incomplete         Radiology Studies: Dg Chest 2 View  Result Date: 06/16/2018 CLINICAL DATA:  Shortness of breath EXAM: CHEST - 2 VIEW COMPARISON:  06/14/2018 FINDINGS: Heart and mediastinal contours are within normal limits. No focal opacities or effusions. No acute bony abnormality. IMPRESSION: No active cardiopulmonary disease. Electronically Signed   By:  Charlett Nose M.D.   On: 06/16/2018 19:54   Ct Angio Chest/abd/pel For Dissection W And/or W/wo  Result Date: 06/16/2018 CLINICAL DATA:  58 year old male with shortness of breath, chest and back pain. Concern for aortic dissection. EXAM: CT ANGIOGRAPHY CHEST, ABDOMEN AND PELVIS TECHNIQUE: Multidetector CT imaging through the chest, abdomen and pelvis was performed using the standard protocol during bolus administration of intravenous contrast. Multiplanar reconstructed images and MIPs were obtained and reviewed to evaluate the vascular anatomy. CONTRAST:  ISOVUE-370 IOPAMIDOL (ISOVUE-370) INJECTION 76% COMPARISON:  Chest radiograph dated 06/16/2018 FINDINGS: CTA CHEST FINDINGS Cardiovascular: There is no cardiomegaly or pericardial effusion. Multi vessel coronary vascular calcification. Mild atherosclerotic calcification of the thoracic aorta.  No aneurysmal dilatation or dissection. The visualized origins of the great vessels of the aortic arch appear patent. Evaluation of the pulmonary arteries is limited due to suboptimal opacification of the peripheral branches. No large or central pulmonary artery embolus. Mediastinum/Nodes: There is no hilar or mediastinal adenopathy. The esophagus and the thyroid gland are grossly unremarkable. No mediastinal fluid collection. Lungs/Pleura: The lungs are clear. There is no pleural effusion or pneumothorax. The central airways are patent. Musculoskeletal: No chest wall abnormality. No acute or significant osseous findings. Review of the MIP images confirms the above findings. CTA ABDOMEN AND PELVIS FINDINGS VASCULAR Aorta: There is moderate atherosclerotic calcification of the abdominal aorta. No aneurysmal dilatation or dissection. Celiac: Patent without evidence of aneurysm, dissection, vasculitis or significant stenosis. SMA: Patent without evidence of aneurysm, dissection, vasculitis or significant stenosis. Renals: Both renal arteries are patent without evidence  of aneurysm, dissection, vasculitis, fibromuscular dysplasia or significant stenosis. IMA: Patent without evidence of aneurysm, dissection, vasculitis or significant stenosis. Inflow: Moderate atherosclerotic calcification of the iliac arteries. The iliac arteries remain patent. No aneurysm or acute dissection. Veins: No obvious venous abnormality within the limitations of this arterial phase study. Review of the MIP images confirms the above findings. NON-VASCULAR No intra-abdominal free air or free fluid. Hepatobiliary: A 12 mm hypodense lesion in the inferior right lobe of the liver is not well characterized, likely a cyst. No intrahepatic biliary ductal dilatation. The gallbladder is unremarkable. Pancreas: Unremarkable. No pancreatic ductal dilatation or surrounding inflammatory changes. Spleen: Normal in size without focal abnormality. Adrenals/Urinary Tract: Adrenal glands are unremarkable. Kidneys are normal, without renal calculi, focal lesion, or hydronephrosis. Bladder is unremarkable. Stomach/Bowel: There is sigmoid diverticulosis without active inflammatory changes. There is no bowel obstruction or active inflammation. Normal appendix. Lymphatic: No significant vascular findings are present. No enlarged abdominal or pelvic lymph nodes. Reproductive: The prostate and seminal vesicles are grossly unremarkable. No pelvic mass. Other: None Musculoskeletal: L4-L5 disc spacer. No acute osseous pathology. Review of the MIP images confirms the above findings. IMPRESSION: No acute intrathoracic, abdominal, or pelvic pathology. No aortic dissection or aneurysm. No CT evidence of central pulmonary artery embolus. Electronically Signed   By: Elgie Collard M.D.   On: 06/16/2018 23:44        Scheduled Meds: . [START ON 06/19/2018] amLODipine  10 mg Oral Daily  . aspirin EC  81 mg Oral Daily  . benzonatate  100 mg Oral TID  . dextromethorphan-guaiFENesin  1 tablet Oral BID  . enoxaparin (LOVENOX)  injection  40 mg Subcutaneous Q24H  . ipratropium-albuterol  3 mL Nebulization TID  . methocarbamol  500 mg Oral TID  . methylPREDNISolone (SOLU-MEDROL) injection  60 mg Intravenous Q6H  . nicotine  14 mg Transdermal Daily   Continuous Infusions: . sodium chloride 250 mL (06/18/18 1025)  . cefTRIAXone (ROCEPHIN)  IV 1 g (06/18/18 1026)     LOS: 0 days    Time spent: 36 minutes.     Kathlen Mody, MD [facility] Pager [phone]  If 7PM-7AM, please contact night-coverage [email or web address] Password [facility] 06/18/2018, 6:34 PM

## 2018-06-18 NOTE — Progress Notes (Signed)
Pt's HR sustaining in 120-130's this shift. BP also high at 166/102. Pt asymptmatic apart from a headache. On-call provider, Bodenheimer, notified via text/page. New orders received and carried out. Pt's  HR responded well to intervention, but BP remained high. On call provider updated and new order received. Vitals stable and pt comfortable at this time without complaints. Will continue to monitor.

## 2018-06-19 LAB — CULTURE, RESPIRATORY W GRAM STAIN

## 2018-06-19 LAB — CULTURE, RESPIRATORY

## 2018-06-19 MED ORDER — PREDNISONE 20 MG PO TABS: ORAL_TABLET | ORAL | 0 refills | Status: DC

## 2018-06-19 MED ORDER — NICOTINE 14 MG/24HR TD PT24: 14.0000 mg | MEDICATED_PATCH | Freq: Every day | TRANSDERMAL | 0 refills | Status: DC

## 2018-06-19 MED ORDER — DM-GUAIFENESIN ER 30-600 MG PO TB12: 1.0000 | ORAL_TABLET | Freq: Two times a day (BID) | ORAL | 0 refills | Status: DC

## 2018-06-19 MED ORDER — AMLODIPINE BESYLATE 10 MG PO TABS: 10.0000 mg | ORAL_TABLET | Freq: Every day | ORAL | 0 refills | Status: DC

## 2018-06-19 MED ORDER — MOMETASONE FURO-FORMOTEROL FUM 200-5 MCG/ACT IN AERO
2.0000 | INHALATION_SPRAY | Freq: Two times a day (BID) | RESPIRATORY_TRACT | Status: DC
  Filled 2018-06-19: qty 8.8

## 2018-06-19 MED ORDER — IBUPROFEN 200 MG PO TABS
600.0000 mg | ORAL_TABLET | Freq: Once | ORAL | Status: AC
  Administered 2018-06-19: 600 mg via ORAL
  Filled 2018-06-19: qty 3

## 2018-06-19 MED ORDER — IPRATROPIUM-ALBUTEROL 0.5-2.5 (3) MG/3ML IN SOLN: 3.0000 mL | Freq: Three times a day (TID) | RESPIRATORY_TRACT | 5 refills | Status: DC

## 2018-06-19 MED ORDER — MOMETASONE FURO-FORMOTEROL FUM 200-5 MCG/ACT IN AERO: 2.0000 | INHALATION_SPRAY | Freq: Two times a day (BID) | RESPIRATORY_TRACT | Status: DC

## 2018-06-19 NOTE — Progress Notes (Signed)
Discharge instructions explained to patient and prescription given. I documented when his next medications were due. Also explained that several prescriptions were called into his pharmacy. Patient states understanding the instructions. Francisco [name] ask to see the social worker again, I ask him if he needed a ride home and he stated yes. I called Megan the sw and she didn't pickup. The patient did not want to wait and stated he could walk home. He was escorted to the front door by a tech.

## 2018-06-19 NOTE — Discharge Summary (Signed)
Physician Discharge Summary  Francisco [name] QHU:[id number] DOB: 1960/08/09 DOA: 06/16/2018  PCP: Patient, No Pcp Per  Admit date: 06/16/2018 Discharge date: 06/19/2018  Admitted From: [facility].  Disposition:  Home.   Recommendations for Outpatient Follow-up:  1. Follow up with PCP in 1-2 weeks 2. Please obtain BMP/CBC in one week    Discharge Condition:stable.  CODE STATUS: full code.  Diet recommendation: Heart Healthy   Brief/Interim Summary: Francisco [name] a 58 y.o.malewith Past medical history ofCOPD, HTN, hep C infection, CAD, substance abuse. Patient presents with complaints of this of breath and cough. He was admitted for acute COPD exacerbation.  Patient required BiPAP on arrival, received multiple breathing treatment despite which his wheezing was not improving and remain hypoxic and therefore patient was referred for admission  Discharge Diagnoses:  Active Problems:   Acute respiratory failure with hypoxia (HCC)   Acute respiratory failure with hypoxia secondary to acute COPD exacerbation:  He was started on IV steroids, duo nebs and rocephin. As his wheezing improved, he was transitioned to oral steroid taper, levaquin and duo nebs.  Sputum cultures grew moraxella.    Chest pain, atypical , associated with cough.   resolved.     Hypertension:  Better controlled.    Tobacco abuse: Nicotine patch.     Discharge Instructions  Discharge Instructions    Diet - low sodium heart healthy   Complete by:  As directed    Discharge instructions   Complete by:  As directed    Follow up with PCP as recommended.  Please follow up with pulmonary if symptoms re occur.     Allergies as of 06/19/2018      Reactions   Ivp Dye [iodinated Diagnostic Agents] Other (See Comments)   seizures   Tramadol Other (See Comments)   seizures      Medication List    STOP taking these medications   doxycycline 100 MG capsule Commonly known as:  VIBRAMYCIN    methylPREDNISolone 4 MG Tbpk tablet Commonly known as:  MEDROL DOSEPAK     TAKE these medications   albuterol 108 (90 Base) MCG/ACT inhaler Commonly known as:  PROVENTIL HFA;VENTOLIN HFA Inhale 1-2 puffs into the lungs every 6 (six) hours as needed for wheezing or shortness of breath.   amLODipine 10 MG tablet Commonly known as:  NORVASC Take 1 tablet (10 mg total) by mouth daily. Start taking on:  June 20, 2018   aspirin EC 81 MG tablet Take 81 mg by mouth daily.   benzonatate 100 MG capsule Commonly known as:  TESSALON Take 1 capsule (100 mg total) by mouth every 8 (eight) hours.   dextromethorphan-guaiFENesin 30-600 MG 12hr tablet Commonly known as:  MUCINEX DM Take 1 tablet by mouth 2 (two) times daily.   ipratropium-albuterol 0.5-2.5 (3) MG/3ML Soln Commonly known as:  DUONEB Take 3 mLs by nebulization 3 (three) times daily.   lisinopril 10 MG tablet Commonly known as:  PRINIVIL,ZESTRIL Take 10 mg by mouth daily.   mometasone-formoterol 200-5 MCG/ACT Aero Commonly known as:  DULERA Inhale 2 puffs into the lungs 2 (two) times daily.   nicotine 14 mg/24hr patch Commonly known as:  NICODERM CQ - dosed in mg/24 hours Place 1 patch (14 mg total) onto the skin daily. Start taking on:  June 20, 2018   predniSONE 20 MG tablet Commonly known as:  DELTASONE Prednisone 60 mg daily for 3 days followed by  Prednisone 40 mg daily for 3 days followed by  Prednisone 20  mg daily for 3 days. What changed:    medication strength  additional instructions            Durable Medical Equipment  (From admission, onward)         Start     Ordered   06/19/18 1044  For home use only DME Nebulizer machine  Once    Question:  Patient needs a nebulizer to treat with the following condition  Answer:  COPD (chronic obstructive pulmonary disease) (HCC)   06/19/18 1044         Follow-up Information    [facility] Follow up on 07/17/2018.   Specialty:   Family Medicine Why:  Appointment at 0930 AM, 07/17/2018 please keep this appointment.   Contact information: [address] [place] [address] [phone]       [facility]. Schedule an appointment as soon as possible for a visit in 4 week(s).   Specialty:  Pulmonology Contact information: [address] [place] [address] [phone]         Allergies  Allergen Reactions  . Ivp Dye [Iodinated Diagnostic Agents] Other (See Comments)    seizures  . Tramadol Other (See Comments)    seizures    Consultations: None.   Procedures/Studies: Dg Chest 2 View  Result Date: 06/16/2018 CLINICAL DATA:  Shortness of breath EXAM: CHEST - 2 VIEW COMPARISON:  06/14/2018 FINDINGS: Heart and mediastinal contours are within normal limits. No focal opacities or effusions. No acute bony abnormality. IMPRESSION: No active cardiopulmonary disease. Electronically Signed   By: Charlett Nose M.D.   On: 06/16/2018 19:54   Dg Chest 2 View  Result Date: 06/14/2018 CLINICAL DATA:  Cough EXAM: CHEST - 2 VIEW COMPARISON:  06/12/2018 FINDINGS: The heart size and mediastinal contours are within normal limits. Both lungs are clear. The visualized skeletal structures are unremarkable. IMPRESSION: No active cardiopulmonary disease. Electronically Signed   By: Deatra Robinson M.D.   On: 06/14/2018 17:19   Dg Chest 2 View  Result Date: 06/12/2018 CLINICAL DATA:  Productive cough EXAM: CHEST - 2 VIEW COMPARISON:  None. FINDINGS: Heart and mediastinal contours are within normal limits. No focal opacities or effusions. No acute bony abnormality. IMPRESSION: No active cardiopulmonary disease. Electronically Signed   By: Charlett Nose M.D.   On: 06/12/2018 20:54   Ct Angio Chest/abd/pel For Dissection W And/or W/wo  Result Date: 06/16/2018 CLINICAL DATA:  58 year old male with shortness of breath, chest and back pain. Concern for aortic dissection.  EXAM: CT ANGIOGRAPHY CHEST, ABDOMEN AND PELVIS TECHNIQUE: Multidetector CT imaging through the chest, abdomen and pelvis was performed using the standard protocol during bolus administration of intravenous contrast. Multiplanar reconstructed images and MIPs were obtained and reviewed to evaluate the vascular anatomy. CONTRAST:  ISOVUE-370 IOPAMIDOL (ISOVUE-370) INJECTION 76% COMPARISON:  Chest radiograph dated 06/16/2018 FINDINGS: CTA CHEST FINDINGS Cardiovascular: There is no cardiomegaly or pericardial effusion. Multi vessel coronary vascular calcification. Mild atherosclerotic calcification of the thoracic aorta. No aneurysmal dilatation or dissection. The visualized origins of the great vessels of the aortic arch appear patent. Evaluation of the pulmonary arteries is limited due to suboptimal opacification of the peripheral branches. No large or central pulmonary artery embolus. Mediastinum/Nodes: There is no hilar or mediastinal adenopathy. The esophagus and the thyroid gland are grossly unremarkable. No mediastinal fluid collection. Lungs/Pleura: The lungs are clear. There is no pleural effusion or pneumothorax. The central airways are patent. Musculoskeletal:  No chest wall abnormality. No acute or significant osseous findings. Review of the MIP images confirms the above findings. CTA ABDOMEN AND PELVIS FINDINGS VASCULAR Aorta: There is moderate atherosclerotic calcification of the abdominal aorta. No aneurysmal dilatation or dissection. Celiac: Patent without evidence of aneurysm, dissection, vasculitis or significant stenosis. SMA: Patent without evidence of aneurysm, dissection, vasculitis or significant stenosis. Renals: Both renal arteries are patent without evidence of aneurysm, dissection, vasculitis, fibromuscular dysplasia or significant stenosis. IMA: Patent without evidence of aneurysm, dissection, vasculitis or significant stenosis. Inflow: Moderate atherosclerotic calcification of the iliac  arteries. The iliac arteries remain patent. No aneurysm or acute dissection. Veins: No obvious venous abnormality within the limitations of this arterial phase study. Review of the MIP images confirms the above findings. NON-VASCULAR No intra-abdominal free air or free fluid. Hepatobiliary: A 12 mm hypodense lesion in the inferior right lobe of the liver is not well characterized, likely a cyst. No intrahepatic biliary ductal dilatation. The gallbladder is unremarkable. Pancreas: Unremarkable. No pancreatic ductal dilatation or surrounding inflammatory changes. Spleen: Normal in size without focal abnormality. Adrenals/Urinary Tract: Adrenal glands are unremarkable. Kidneys are normal, without renal calculi, focal lesion, or hydronephrosis. Bladder is unremarkable. Stomach/Bowel: There is sigmoid diverticulosis without active inflammatory changes. There is no bowel obstruction or active inflammation. Normal appendix. Lymphatic: No significant vascular findings are present. No enlarged abdominal or pelvic lymph nodes. Reproductive: The prostate and seminal vesicles are grossly unremarkable. No pelvic mass. Other: None Musculoskeletal: L4-L5 disc spacer. No acute osseous pathology. Review of the MIP images confirms the above findings. IMPRESSION: No acute intrathoracic, abdominal, or pelvic pathology. No aortic dissection or aneurysm. No CT evidence of central pulmonary artery embolus. Electronically Signed   By: Elgie Collard M.D.   On: 06/16/2018 23:44      Subjective: No chest pain, sob improved.   Discharge Exam: Vitals:   06/19/18 0829 06/19/18 1008  BP:  (!) 151/92  Pulse:    Resp:    Temp:    SpO2: 97%    Vitals:   06/19/18 0208 06/19/18 0602 06/19/18 0829 06/19/18 1008  BP:  (!) 180/116  (!) 151/92  Pulse:  94    Resp:  16    Temp:  98 F (36.7 C)    TempSrc:  Oral    SpO2: 96% 97% 97%   Weight:      Height:        General: Pt is alert, awake, not in acute  distress Cardiovascular: RRR, S1/S2 +, no rubs, no gallops Respiratory: CTA bilaterally, no wheezing, no rhonchi Abdominal: Soft, NT, ND, bowel sounds + Extremities: no edema, no cyanosis    The results of significant diagnostics from this hospitalization (including imaging, microbiology, ancillary and laboratory) are listed below for reference.     Microbiology: Recent Results (from the past 240 hour(s))  Culture, sputum-assessment     Status: None   Collection Time: 06/17/18  8:22 AM  Result Value Ref Range Status   Specimen Description SPUTUM  Final   Special Requests NONE  Final   Sputum evaluation   Final    THIS SPECIMEN IS ACCEPTABLE FOR SPUTUM CULTURE Performed at [facility], [address], [place], [place] [address]    Report Status 06/17/2018 FINAL  Final  Culture, respiratory     Status: None (Preliminary result)   Collection Time: 06/17/18  8:22 AM  Result Value Ref Range Status   Specimen Description   Final    SPUTUM Performed  at [facility], [address], [place], [place] [address]    Special Requests   Final    NONE Reflexed from [id number] Performed at [facility], [address] [place], [place] [address]    Gram Stain   Final    MODERATE WBC PRESENT, PREDOMINANTLY PMN RARE SQUAMOUS EPITHELIAL CELLS PRESENT ABUNDANT GRAM NEGATIVE COCCI IN PAIRS RARE GRAM POSITIVE COCCI IN PAIRS    Culture   Final    CULTURE REINCUBATED FOR BETTER GROWTH Performed at [facility], [address] [place], [place] [address]    Report Status PENDING  Incomplete     Labs: BNP (last 3 results) Recent Labs    06/16/18 2042  BNP 221.1*   Basic Metabolic Panel: Recent Labs  Lab 06/12/18 1944 06/14/18 1646 06/16/18 2021 06/18/18 0501  NA 137 139 137 137  K 3.6 4.2 3.8 4.0  CL 103 105 99 102  CO2 GLUCOSE 102* 130* 175* 132*  BUN CREATININE 0.74 0.77 0.69 0.73  CALCIUM  9.3 9.2 9.0 9.1   Liver Function Tests: Recent Labs  Lab 06/16/18 2041 06/18/18 0501  AST 23 19  ALT 74* 54*  ALKPHOS 77 81  BILITOT 0.4 0.7  PROT 7.0 6.9  ALBUMIN 3.7 3.6   Recent Labs  Lab 06/16/18 2041  LIPASE 31   No results for input(s): AMMONIA in the last 168 hours. CBC: Recent Labs  Lab 06/12/18 1944 06/14/18 1646 06/16/18 2021 06/18/18 0501  WBC 8.8 8.9 11.0* 18.0*  NEUTROABS  --  7.3 8.9*  --   HGB 14.5 14.8 14.7 15.0  HCT 44.2 45.2 45.9 46.0  MCV 97.8 100.0 99.6 99.4  PLT 272 313 358 366   Cardiac Enzymes: No results for input(s): CKTOTAL, CKMB, CKMBINDEX, TROPONINI in the last 168 hours. BNP: Invalid input(s): POCBNP CBG: No results for input(s): GLUCAP in the last 168 hours. D-Dimer No results for input(s): DDIMER in the last 72 hours. Hgb A1c No results for input(s): HGBA1C in the last 72 hours. Lipid Profile No results for input(s): CHOL, HDL, LDLCALC, TRIG, CHOLHDL, LDLDIRECT in the last 72 hours. Thyroid function studies Recent Labs    06/16/18 2156  TSH 0.443   Anemia work up No results for input(s): VITAMINB12, FOLATE, FERRITIN, TIBC, IRON, RETICCTPCT in the last 72 hours. Urinalysis    Component Value Date/Time   COLORURINE YELLOW 06/16/2018 2222   APPEARANCEUR HAZY (A) 06/16/2018 2222   LABSPEC 1.017 06/16/2018 2222   PHURINE 7.0 06/16/2018 2222   GLUCOSEU NEGATIVE 06/16/2018 2222   HGBUR NEGATIVE 06/16/2018 2222   BILIRUBINUR NEGATIVE 06/16/2018 2222   KETONESUR NEGATIVE 06/16/2018 2222   PROTEINUR NEGATIVE 06/16/2018 2222   NITRITE NEGATIVE 06/16/2018 2222   LEUKOCYTESUR NEGATIVE 06/16/2018 2222   Sepsis Labs Invalid input(s): PROCALCITONIN,  WBC,  LACTICIDVEN Microbiology Recent Results (from the past 240 hour(s))  Culture, sputum-assessment     Status: None   Collection Time: 06/17/18  8:22 AM  Result Value Ref Range Status   Specimen Description SPUTUM  Final   Special Requests NONE  Final   Sputum evaluation    Final    THIS SPECIMEN IS ACCEPTABLE FOR SPUTUM CULTURE Performed at [facility], [address], [place], [place] [address]    Report Status 06/17/2018 FINAL  Final  Culture, respiratory     Status: None (Preliminary result)   Collection Time: 06/17/18  8:22  AM  Result Value Ref Range Status   Specimen Description   Final    SPUTUM Performed at [facility] [address], [place], [place] [address]    Special Requests   Final    NONE Reflexed from [id number] Performed at [facility], [address], [place], [place] [address]    Gram Stain   Final    MODERATE WBC PRESENT, PREDOMINANTLY PMN RARE SQUAMOUS EPITHELIAL CELLS PRESENT ABUNDANT GRAM NEGATIVE COCCI IN PAIRS RARE GRAM POSITIVE COCCI IN PAIRS    Culture   Final    CULTURE REINCUBATED FOR BETTER GROWTH Performed at [facility], [address], [place], [place] [address]    Report Status PENDING  Incomplete     Time coordinating discharge: 38 minutes  SIGNED:   Kathlen Mody, MD  [facility] 06/19/2018, 11:20 AM Pager   If 7PM-7AM, please contact night-coverage [email or web address] Password [email or web address]

## 2018-06-19 NOTE — Progress Notes (Signed)
Attempted to meet with pt in response to consult re: financial and transportation needs. Pt on phone conversation and stated to CSW briefly that he [facility] at [facility] [address] [facility]) and may need transportation home at [place], will notify CSW if needed.  Ilean Skill, MSW, LCSW Clinical Social Work 06/19/2018 [phone]

## 2018-07-14 ENCOUNTER — Telehealth: Payer: Self-pay

## 2018-07-14 NOTE — Telephone Encounter (Signed)
Called patient to do their pre-visit COVID screening.  No answer. Unable to pre-screen.

## 2018-07-17 ENCOUNTER — Inpatient Hospital Stay: Payer: Self-pay | Admitting: Family Medicine

## 2018-11-04 ENCOUNTER — Emergency Department (HOSPITAL_COMMUNITY): Payer: Self-pay

## 2018-11-04 ENCOUNTER — Encounter (HOSPITAL_COMMUNITY): Payer: Self-pay | Admitting: Emergency Medicine

## 2018-11-04 ENCOUNTER — Other Ambulatory Visit: Payer: Self-pay

## 2018-11-04 ENCOUNTER — Emergency Department (HOSPITAL_COMMUNITY)
Admission: EM | Admit: 2018-11-04 | Discharge: 2018-11-04 | Disposition: A | Discharge status: Home / Self Care | Payer: Self-pay | Attending: Emergency Medicine | Admitting: Emergency Medicine

## 2018-11-04 DIAGNOSIS — Z7982 Long term (current) use of aspirin: Secondary | ICD-10-CM | POA: Insufficient documentation

## 2018-11-04 DIAGNOSIS — J069 Acute upper respiratory infection, unspecified: Secondary | ICD-10-CM | POA: Insufficient documentation

## 2018-11-04 DIAGNOSIS — F17210 Nicotine dependence, cigarettes, uncomplicated: Secondary | ICD-10-CM | POA: Insufficient documentation

## 2018-11-04 DIAGNOSIS — Z20828 Contact with and (suspected) exposure to other viral communicable diseases: Secondary | ICD-10-CM | POA: Insufficient documentation

## 2018-11-04 DIAGNOSIS — Z79899 Other long term (current) drug therapy: Secondary | ICD-10-CM | POA: Insufficient documentation

## 2018-11-04 DIAGNOSIS — R0600 Dyspnea, unspecified: Secondary | ICD-10-CM | POA: Insufficient documentation

## 2018-11-04 DIAGNOSIS — I259 Chronic ischemic heart disease, unspecified: Secondary | ICD-10-CM | POA: Insufficient documentation

## 2018-11-04 DIAGNOSIS — J449 Chronic obstructive pulmonary disease, unspecified: Secondary | ICD-10-CM | POA: Insufficient documentation

## 2018-11-04 DIAGNOSIS — I10 Essential (primary) hypertension: Secondary | ICD-10-CM | POA: Insufficient documentation

## 2018-11-04 LAB — COMPREHENSIVE METABOLIC PANEL
ALT: 152 U/L — ABNORMAL HIGH (ref 0–44)
AST: 65 U/L — ABNORMAL HIGH (ref 15–41)
Albumin: 3.7 g/dL (ref 3.5–5.0)
Alkaline Phosphatase: 87 U/L (ref 38–126)
Anion gap: 11 (ref 5–15)
BUN: 10 mg/dL (ref 6–20)
CO2: 26 mmol/L (ref 22–32)
Calcium: 8.9 mg/dL (ref 8.9–10.3)
Chloride: 102 mmol/L (ref 98–111)
Creatinine, Ser: 0.81 mg/dL (ref 0.61–1.24)
GFR calc Af Amer: >60 mL/min (ref >60)
GFR calc non Af Amer: >60 mL/min (ref >60)
Glucose, Bld: 102 mg/dL — ABNORMAL HIGH (ref 70–99)
Potassium: 3.4 mmol/L — ABNORMAL LOW (ref 3.5–5.1)
Sodium: 139 mmol/L (ref 135–145)
Total Bilirubin: 0.4 mg/dL (ref 0.3–1.2)
Total Protein: 7.5 g/dL (ref 6.5–8.1)

## 2018-11-04 LAB — CBC
HCT: 47.1 % (ref 39.0–52.0)
Hemoglobin: 16 g/dL (ref 13.0–17.0)
MCH: 32.4 pg (ref 26.0–34.0)
MCHC: 34 g/dL (ref 30.0–36.0)
MCV: 95.3 fL (ref 80.0–100.0)
Platelets: 267 10*3/uL (ref 150–400)
RBC: 4.94 MIL/uL (ref 4.22–5.81)
RDW: 13.2 % (ref 11.5–15.5)
WBC: 9.7 10*3/uL (ref 4.0–10.5)
nRBC: 0 % (ref 0.0–0.2)

## 2018-11-04 LAB — SARS CORONAVIRUS 2 BY RT PCR (HOSPITAL ORDER, PERFORMED IN ~~LOC~~ HOSPITAL LAB): SARS Coronavirus 2: NEGATIVE

## 2018-11-04 LAB — LACTIC ACID, PLASMA: Lactic Acid, Venous: 1.3 mmol/L (ref 0.5–1.9)

## 2018-11-04 LAB — LIPASE, BLOOD: Lipase: 27 U/L (ref 11–51)

## 2018-11-04 MED ORDER — MORPHINE SULFATE (PF) 4 MG/ML IV SOLN
4.0000 mg | Freq: Once | INTRAVENOUS | Status: AC
  Administered 2018-11-04: 4 mg via INTRAVENOUS

## 2018-11-04 MED ORDER — SODIUM CHLORIDE 0.9 % IV BOLUS
1000.0000 mL | Freq: Once | INTRAVENOUS | Status: AC
  Administered 2018-11-04: 17:00:00 1000 mL via INTRAVENOUS

## 2018-11-04 MED ORDER — MORPHINE SULFATE (PF) 4 MG/ML IV SOLN
4.0000 mg | Freq: Once | INTRAVENOUS | Status: DC
  Filled 2018-11-04 (×2): qty 1

## 2018-11-04 MED ORDER — ALBUTEROL SULFATE HFA 108 (90 BASE) MCG/ACT IN AERS
2.0000 | INHALATION_SPRAY | Freq: Once | RESPIRATORY_TRACT | Status: AC
  Administered 2018-11-04: 2 via RESPIRATORY_TRACT
  Filled 2018-11-04: qty 6.7

## 2018-11-04 NOTE — ED Provider Notes (Signed)
[facility] DEPT Provider Note   CSN: [id number] Arrival date & time: 11/04/18  1524     History   Chief Complaint Chief Complaint  Patient presents with  . Fever  . Fatigue  . Headache  . Abdominal Cramping  . Shortness of Breath    HPI Francisco [name] is a 58 y.o. male.     HPI Patient is a 58 year old male who presents the emergency department with fever headache myalgias productive cough and mild shortness of breath over the past 48 hours.  He reports 2 coworkers are also sick and out of work.  He does not know if they have been evaluated for coronavirus.  He has no new rash.  Denies abdominal pain.  No nausea vomiting or diarrhea.  He states he is felt awful over the past 48 hours.  He has some discomfort behind his shoulder blades.  No history of DVT or pulmonary embolism.   Past Medical History:  Diagnosis Date  . COPD (chronic obstructive pulmonary disease) ([place])   . Coronary artery disease   . Hepatitis-C   . Hypertension   . MI (myocardial infarction) [facility])     Patient Active Problem List   Diagnosis Date Noted  . Acute respiratory failure with hypoxia ([place]) 06/17/2018    Past Surgical History:  Procedure Laterality Date  . BACK SURGERY    . NECK SURGERY    . stints          Home Medications    Prior to Admission medications   Medication Sig Start Date End Date Taking? Authorizing Provider  acetaminophen (TYLENOL) 325 MG tablet Take 650 mg by mouth every 6 (six) hours as needed for fever or headache.   Yes [provider]  albuterol (PROVENTIL HFA;VENTOLIN HFA) 108 (90 Base) MCG/ACT inhaler Inhale 1-2 puffs into the lungs every 6 (six) hours as needed for wheezing or shortness of breath. 06/12/18  Yes Lacretia Leigh, MD  aspirin EC 81 MG tablet Take 81 mg by mouth daily.   Yes [provider]  ipratropium-albuterol (DUONEB) 0.5-2.5 (3) MG/3ML SOLN Take 3 mLs by nebulization 3 (three) times daily. 06/19/18   Yes Hosie Poisson, MD  lisinopril (PRINIVIL,ZESTRIL) 10 MG tablet Take 10 mg by mouth daily.   Yes [provider]  amLODipine (NORVASC) 10 MG tablet Take 1 tablet (10 mg total) by mouth daily. Patient not taking: Reported on 11/04/2018 06/20/18   Hosie Poisson, MD  mometasone-formoterol [facility]) 200-5 MCG/ACT AERO Inhale 2 puffs into the lungs 2 (two) times daily. Patient not taking: Reported on 11/04/2018 06/19/18   Hosie Poisson, MD  nicotine (NICODERM CQ - DOSED IN MG/24 HOURS) 14 mg/24hr patch Place 1 patch (14 mg total) onto the skin daily. Patient not taking: Reported on 11/04/2018 06/20/18   Hosie Poisson, MD    Family History Family History  Problem Relation Age of Onset  . Rheum arthritis Mother     Social History Social History   Tobacco Use  . Smoking status: Current Every Day Smoker    Packs/day: 0.75    Types: Cigarettes  . Smokeless tobacco: Never Used  Substance Use Topics  . Alcohol use: Never    Frequency: Never  . 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  Physical Exam Updated Vital Signs BP (!) 183/108   Pulse 86   Temp 98.5 F (36.9 C) (Oral)   Resp 20   SpO2 95%   Physical Exam Vitals signs and nursing note reviewed.  Constitutional:      Appearance: He is well-developed.  HENT:     Head: Normocephalic and atraumatic.  Neck:     Musculoskeletal: Normal range of motion.  Cardiovascular:     Rate and Rhythm: Normal rate and regular rhythm.     Heart sounds: Normal heart sounds.  Pulmonary:     Effort: Pulmonary effort is normal. No respiratory distress.     Breath sounds: Normal breath sounds.  Abdominal:     General: There is no distension.     Palpations: Abdomen is soft.     Tenderness: There is no abdominal tenderness.  Musculoskeletal: Normal  range of motion.  Skin:    General: Skin is warm and dry.  Neurological:     Mental Status: He is alert and oriented to person, place, and time.  Psychiatric:        Judgment: Judgment normal.      ED Treatments / Results  Labs (all labs ordered are listed, but only abnormal results are displayed) Labs Reviewed  COMPREHENSIVE METABOLIC PANEL - Abnormal; Notable for the following components:      Result Value   Potassium 3.4 (*)    Glucose, Bld 102 (*)    AST 65 (*)    ALT 152 (*)    All other components within normal limits  SARS CORONAVIRUS 2 (HOSPITAL ORDER, PERFORMED IN [facility])  LIPASE, BLOOD  CBC  LACTIC ACID, PLASMA  URINALYSIS, ROUTINE W REFLEX MICROSCOPIC    EKG EKG Interpretation  Date/Time:  Saturday November 04 2018 16:13:13 EDT Ventricular Rate:  97 PR Interval:    QRS Duration: 77 QT Interval:  378 QTC Calculation: 481 R Axis:   73 Text Interpretation:  Sinus rhythm Right atrial enlargement Borderline repolarization abnormality Borderline prolonged QT interval Baseline wander in lead(s) II III aVF No significant change was found Confirmed by Azalia Bilis ([id number]) on 11/04/2018 7:58:08 PM   Radiology Ct Chest Wo Contrast  Result Date: 11/04/2018 CLINICAL DATA:  Cough, fever, shortness of breath and back pain. COVID-19 negative. EXAM: CT CHEST WITHOUT CONTRAST TECHNIQUE: Multidetector CT imaging of the chest was performed following the standard protocol without IV contrast. Patient reported IV contrast allergy. COMPARISON:  Chest CTA 06/16/2018. Radiograph earlier this day. FINDINGS: Cardiovascular: Normal caliber thoracic aorta with atherosclerosis. No periaortic stranding. There are coronary artery calcifications versus stents. Heart is normal in size. No pericardial effusion. Mediastinum/Nodes: Subcentimeter mediastinal lymph nodes not enlarged by size criteria. Limited assessment for hilar adenopathy in the absence of IV contrast. No bulky hilar  adenopathy is seen. Minimal distal esophageal wall thickening. No visualized thyroid nodule. Lungs/Pleura: Minor linear scarring in the right lower lobe, unchanged from prior. No acute airspace disease. No pulmonary edema. No pleural fluid. Mild central bronchial thickening. No pulmonary mass. Upper Abdomen: No acute abnormality. Ingested material within the stomach. Musculoskeletal: Mild degenerative change in the thoracic spine with multiple Schmorl's nodes. Slight anterior wedging of midthoracic vertebra are unchanged from prior. There are no acute or suspicious osseous abnormalities. IMPRESSION: 1. No pneumonia. Mild central bronchial thickening, can be seen with bronchitis or smoking related lung disease. 2. Coronary artery calcifications versus stents. Aortic Atherosclerosis (ICD10-I70.0). Electronically Signed   By: Narda Rutherford M.D.   On:  11/04/2018 19:24   Dg Chest Portable 1 View  Result Date: 11/04/2018 CLINICAL DATA:  Chest pain and fever. EXAM: PORTABLE CHEST 1 VIEW COMPARISON:  06/16/2018. FINDINGS: The heart size and mediastinal contours are within normal limits. Both lungs are clear. The visualized skeletal structures are unremarkable. IMPRESSION: No active disease. Electronically Signed   By: Katherine Mantlehristopher  Green M.D.   On: 11/04/2018 17:45    Procedures Procedures (including critical care time)  Medications Ordered in ED Medications  morphine 4 MG/ML injection 4 mg (4 mg Intravenous Not Given 11/04/18 1834)  albuterol (VENTOLIN HFA) 108 (90 Base) MCG/ACT inhaler 2 puff (has no administration in time range)  sodium chloride 0.9 % bolus 1,000 mL (0 mLs Intravenous Stopped 11/04/18 1845)  morphine 4 MG/ML injection 4 mg (4 mg Intravenous Given 11/04/18 1845)     Initial Impression / Assessment and Plan / ED Course  I have reviewed the triage vital signs and the nursing notes.  Pertinent labs & imaging results that were available during my care of the patient were reviewed by me  and considered in my medical decision making (see chart for details).        Patient tested negative for coronavirus here in the emergency department however this could be a false negative.  I still have a high suspicion for it.  Patient understands the importance of self quarantine and appropriate handwashing.  He has been given a work note for 10 days.  He understands to return to the ER for new or worsening symptoms.  From a symptomatic standpoint he feels better here in the emergency department.  Medication for additional work-up or acute hospitalization.  No hypoxia.  Final Clinical Impressions(s) / ED Diagnoses   Final diagnoses:  Dyspnea, unspecified type  Viral URI    ED Discharge Orders    None       Azalia Bilisampos, Yides Saidi, MD 11/04/18 2041

## 2018-11-04 NOTE — ED Triage Notes (Signed)
Pt c/o fevers, headache, body aches, cough-that is productive with clear sputum, SOB-has COPD, sore throat, and pains in shoulder blades when takes deep breath.. Reports 2 coworkers been out sick but unsure if Covid or not.

## 2019-04-16 ENCOUNTER — Other Ambulatory Visit: Payer: Self-pay

## 2019-04-16 ENCOUNTER — Emergency Department (HOSPITAL_COMMUNITY): Payer: Self-pay

## 2019-04-16 ENCOUNTER — Encounter (HOSPITAL_COMMUNITY): Payer: Self-pay | Admitting: Emergency Medicine

## 2019-04-16 ENCOUNTER — Emergency Department (HOSPITAL_COMMUNITY)
Admission: EM | Admit: 2019-04-16 | Discharge: 2019-04-16 | Disposition: A | Discharge status: Home / Self Care | Payer: Self-pay | Attending: Emergency Medicine | Admitting: Emergency Medicine

## 2019-04-16 DIAGNOSIS — Z79899 Other long term (current) drug therapy: Secondary | ICD-10-CM | POA: Insufficient documentation

## 2019-04-16 DIAGNOSIS — I10 Essential (primary) hypertension: Secondary | ICD-10-CM | POA: Insufficient documentation

## 2019-04-16 DIAGNOSIS — Z7982 Long term (current) use of aspirin: Secondary | ICD-10-CM | POA: Insufficient documentation

## 2019-04-16 DIAGNOSIS — J449 Chronic obstructive pulmonary disease, unspecified: Secondary | ICD-10-CM | POA: Insufficient documentation

## 2019-04-16 DIAGNOSIS — F17210 Nicotine dependence, cigarettes, uncomplicated: Secondary | ICD-10-CM | POA: Insufficient documentation

## 2019-04-16 DIAGNOSIS — J441 Chronic obstructive pulmonary disease with (acute) exacerbation: Secondary | ICD-10-CM | POA: Insufficient documentation

## 2019-04-16 LAB — BASIC METABOLIC PANEL
Anion gap: 10 (ref 5–15)
BUN: 7 mg/dL (ref 6–20)
CO2: 27 mmol/L (ref 22–32)
Calcium: 9.4 mg/dL (ref 8.9–10.3)
Chloride: 98 mmol/L (ref 98–111)
Creatinine, Ser: 0.85 mg/dL (ref 0.61–1.24)
GFR calc Af Amer: >60 mL/min (ref >60)
GFR calc non Af Amer: >60 mL/min (ref >60)
Glucose, Bld: 101 mg/dL — ABNORMAL HIGH (ref 70–99)
Potassium: 4 mmol/L (ref 3.5–5.1)
Sodium: 135 mmol/L (ref 135–145)

## 2019-04-16 LAB — CBC WITH DIFFERENTIAL/PLATELET
Abs Immature Granulocytes: 0.04 10*3/uL (ref 0.00–0.07)
Basophils Absolute: 0.1 10*3/uL (ref 0.0–0.1)
Basophils Relative: 1 %
Eosinophils Absolute: 0 10*3/uL (ref 0.0–0.5)
Eosinophils Relative: 0 %
HCT: 48.7 % (ref 39.0–52.0)
Hemoglobin: 16.4 g/dL (ref 13.0–17.0)
Immature Granulocytes: 0 %
Lymphocytes Relative: 13 %
Lymphs Abs: 1.2 10*3/uL (ref 0.7–4.0)
MCH: 32.7 pg (ref 26.0–34.0)
MCHC: 33.7 g/dL (ref 30.0–36.0)
MCV: 97 fL (ref 80.0–100.0)
Monocytes Absolute: 0.4 10*3/uL (ref 0.1–1.0)
Monocytes Relative: 4 %
Neutro Abs: 7.9 10*3/uL — ABNORMAL HIGH (ref 1.7–7.7)
Neutrophils Relative %: 82 %
Platelets: 252 10*3/uL (ref 150–400)
RBC: 5.02 MIL/uL (ref 4.22–5.81)
RDW: 13.4 % (ref 11.5–15.5)
WBC: 9.7 10*3/uL (ref 4.0–10.5)
nRBC: 0 % (ref 0.0–0.2)

## 2019-04-16 MED ORDER — METHYLPREDNISOLONE SODIUM SUCC 125 MG IJ SOLR
125.0000 mg | Freq: Once | INTRAMUSCULAR | Status: AC
  Administered 2019-04-16: 12:00:00 125 mg via INTRAVENOUS
  Filled 2019-04-16: qty 2

## 2019-04-16 MED ORDER — IPRATROPIUM BROMIDE HFA 17 MCG/ACT IN AERS
2.0000 | INHALATION_SPRAY | Freq: Once | RESPIRATORY_TRACT | Status: AC
  Administered 2019-04-16: 2 via RESPIRATORY_TRACT
  Filled 2019-04-16: qty 12.9

## 2019-04-16 MED ORDER — PREDNISONE 20 MG PO TABS: 60.0000 mg | ORAL_TABLET | Freq: Every day | ORAL | 0 refills | Status: DC

## 2019-04-16 MED ORDER — ALBUTEROL SULFATE HFA 108 (90 BASE) MCG/ACT IN AERS
8.0000 | INHALATION_SPRAY | Freq: Once | RESPIRATORY_TRACT | Status: AC
  Administered 2019-04-16: 12:00:00 8 via RESPIRATORY_TRACT
  Filled 2019-04-16: qty 6.7

## 2019-04-16 MED ORDER — MAGNESIUM SULFATE 2 GM/50ML IV SOLN
2.0000 g | Freq: Once | INTRAVENOUS | Status: AC
  Administered 2019-04-16: 12:00:00 2 g via INTRAVENOUS
  Filled 2019-04-16: qty 50

## 2019-04-16 MED ORDER — ALBUTEROL SULFATE HFA 108 (90 BASE) MCG/ACT IN AERS: 1.0000 | INHALATION_SPRAY | Freq: Four times a day (QID) | RESPIRATORY_TRACT | 1 refills | Status: DC | PRN

## 2019-04-16 NOTE — ED Provider Notes (Signed)
[facility]-EMERGENCY DEPT Provider Note   CSN: [id number] Arrival date & time: 04/16/19  1109     History Chief Complaint  Patient presents with  . Shortness of Breath    Francisco [name] is a 58 y.o. male.  He has a history of COPD and is an active smoker.  He is complaining of increased shortness of breath for 3 days.  Cough nonproductive.  No fevers or chills nausea vomiting diarrhea.  No sick contacts or recent travel.  No hemoptysis.  The history is provided by the patient.  Shortness of Breath Severity:  Severe Onset quality:  Gradual Duration:  3 days Timing:  Constant Progression:  Unchanged Chronicity:  Recurrent Context: activity   Relieved by:  Nothing Worsened by:  Activity Ineffective treatments:  Inhaler Associated symptoms: cough and wheezing   Associated symptoms: no abdominal pain, no chest pain, no diaphoresis, no fever, no headaches, no hemoptysis, no rash, no sore throat, no sputum production and no vomiting   Risk factors: tobacco use   Risk factors: no hx of PE/DVT        Past Medical History:  Diagnosis Date  . COPD (chronic obstructive pulmonary disease) (HCC)   . Coronary artery disease   . Hepatitis-C   . Hypertension   . MI (myocardial infarction) [facility])     Patient Active Problem List   Diagnosis Date Noted  . Acute respiratory failure with hypoxia (HCC) 06/17/2018    Past Surgical History:  Procedure Laterality Date  . BACK SURGERY    . NECK SURGERY    . stints         Family History  Problem Relation Age of Onset  . Rheum arthritis Mother     Social History   Tobacco Use  . Smoking status: Current Every Day Smoker    Packs/day: 0.75    Types: Cigarettes  . Smokeless tobacco: Never Used  Substance Use Topics  . Alcohol use: Never  . Drug use: Not Currently    Comment: used meth and heroin in the past    Home Medications Prior to Admission medications   Medication Sig Start Date End Date Taking?  Authorizing Provider  acetaminophen (TYLENOL) 325 MG tablet Take 650 mg by mouth every 6 (six) hours as needed for fever or headache.    [provider]  albuterol (PROVENTIL HFA;VENTOLIN HFA) 108 (90 Base) MCG/ACT inhaler Inhale 1-2 puffs into the lungs every 6 (six) hours as needed for wheezing or shortness of breath. 06/12/18   Lorre Nick, MD  amLODipine (NORVASC) 10 MG tablet Take 1 tablet (10 mg total) by mouth daily. Patient not taking: Reported on 11/04/2018 06/20/18   Kathlen Mody, MD  aspirin EC 81 MG tablet Take 81 mg by mouth daily.    [provider]  ipratropium-albuterol (DUONEB) 0.5-2.5 (3) MG/3ML SOLN Take 3 mLs by nebulization 3 (three) times daily. 06/19/18   Kathlen Mody, MD  lisinopril (PRINIVIL,ZESTRIL) 10 MG tablet Take 10 mg by mouth daily.    [provider]  mometasone-formoterol (DULERA) 200-5 MCG/ACT AERO Inhale 2 puffs into the lungs 2 (two) times daily. Patient not taking: Reported on 11/04/2018 06/19/18   Kathlen Mody, MD  nicotine (NICODERM CQ - DOSED IN MG/24 HOURS) 14 mg/24hr patch Place 1 patch (14 mg total) onto the skin daily. Patient not taking: Reported on 11/04/2018 06/20/18   Kathlen Mody, MD    Allergies    Ivp dye [iodinated diagnostic agents] and Tramadol  Review  of Systems   Review of Systems  Constitutional: Negative for diaphoresis and fever.  HENT: Negative for sore throat.   Eyes: Negative for visual disturbance.  Respiratory: Positive for cough, shortness of breath and wheezing. Negative for hemoptysis and sputum production.   Cardiovascular: Negative for chest pain.  Gastrointestinal: Negative for abdominal pain and vomiting.  Genitourinary: Negative for dysuria.  Musculoskeletal: Negative for joint swelling.  Skin: Negative for rash.  Neurological: Negative for headaches.    Physical Exam Updated Vital Signs BP (!) 192/122 Comment: "have not had my medicine for months"  Pulse (!) 105   Temp 97.7 F (36.5 C)  (Oral)   Resp 18   Ht 6' (1.829 m)   Wt 81.6 kg   SpO2 97%   BMI 24.41 kg/m   Physical Exam Vitals and nursing note reviewed.  Constitutional:      Appearance: He is well-developed.  HENT:     Head: Normocephalic and atraumatic.  Eyes:     Conjunctiva/sclera: Conjunctivae normal.  Cardiovascular:     Rate and Rhythm: Normal rate and regular rhythm.     Heart sounds: No murmur.  Pulmonary:     Effort: Accessory muscle usage present. No respiratory distress.     Breath sounds: Decreased breath sounds and wheezing present.  Abdominal:     Palpations: Abdomen is soft.     Tenderness: There is no abdominal tenderness.  Musculoskeletal:        General: Normal range of motion.     Cervical back: Neck supple.     Right lower leg: No tenderness.     Left lower leg: No tenderness.  Skin:    General: Skin is warm and dry.     Capillary Refill: Capillary refill takes less than 2 seconds.  Neurological:     General: No focal deficit present.     Mental Status: He is alert.     ED Results / Procedures / Treatments   Labs (all labs ordered are listed, but only abnormal results are displayed) Labs Reviewed  BASIC METABOLIC PANEL - Abnormal; Notable for the following components:      Result Value   Glucose, Bld 101 (*)    All other components within normal limits  CBC WITH DIFFERENTIAL/PLATELET - Abnormal; Notable for the following components:   Neutro Abs 7.9 (*)    All other components within normal limits    EKG EKG Interpretation  Date/Time:  Monday April 16 2019 11:20:29 EST Ventricular Rate:  110 PR Interval:    QRS Duration: 88 QT Interval:  327 QTC Calculation: 443 R Axis:   65 Text Interpretation: Sinus tachycardia Biatrial enlargement Nonspecific T abnormalities, lateral leads Baseline wander in lead(s) V6 No significant change since 7/20 Confirmed by Aletta Edouard [phone]) on 04/16/2019 11:22:45 AM   Radiology DG Chest 2 View  Result Date:  04/16/2019 CLINICAL DATA:  Shortness of breath EXAM: CHEST - 2 VIEW COMPARISON:  11/04/2018 FINDINGS: Cardiac shadow is stable. The lungs are hyperinflated similar to that noted on the prior exam. No focal infiltrate or sizable effusion is seen. Mild stable wedging is noted in the midthoracic spine. IMPRESSION: COPD without acute abnormality. Electronically Signed   By: Inez Catalina M.D.   On: 04/16/2019 11:45    Procedures Procedures (including critical care time)  Medications Ordered in ED Medications  magnesium sulfate IVPB 2 g 50 mL (0 g Intravenous Stopped 04/16/19 1301)  methylPREDNISolone sodium succinate (SOLU-MEDROL) 125 mg/2 mL injection 125 mg (125  mg Intravenous Given 04/16/19 1158)  albuterol (VENTOLIN HFA) 108 (90 Base) MCG/ACT inhaler 8 puff (8 puffs Inhalation Given 04/16/19 1202)  ipratropium (ATROVENT HFA) inhaler 2 puff (2 puffs Inhalation Given 04/16/19 1202)    ED Course  I have reviewed the triage vital signs and the nursing notes.  Pertinent labs & imaging results that were available during my care of the patient were reviewed by me and considered in my medical decision making (see chart for details).  Clinical Course as of Apr 15 1644  Mon Apr 16, 2019  1134 Chest x-ray interpreted by me as COPD no gross infiltrates no pneumothorax.   [MB]  [id number] 58 year old male history of COPD here with increased shortness of breath.  Differential includes COPD, CHF, pneumonia, Covid, anemia.  EKG and chest x-ray unremarkable.  Getting steroids magnesium and inhalation treatments.   [MB]  1249 Patient received inhalation treatments and steroids.  I listen to him again and he is moving better air and wheezing has diminished.   [MB]    Clinical Course User Index [MB] Terrilee Files, MD   MDM Rules/Calculators/A&P                     Francisco [name] was evaluated in Emergency Department on 04/16/2019 for the symptoms described in the history of present illness. He was evaluated  in the context of the global COVID-19 pandemic, which necessitated consideration that the patient might be at risk for infection with the SARS-CoV-2 virus that causes COVID-19. Institutional protocols and algorithms that pertain to the evaluation of patients at risk for COVID-19 are in a state of rapid change based on information released by regulatory bodies including the CDC and federal and state organizations. These policies and algorithms were followed during the patient's care in the ED.  Final Clinical Impression(s) / ED Diagnoses Final diagnoses:  COPD exacerbation (HCC)  Essential hypertension    Rx / DC Orders ED Discharge Orders         Ordered    albuterol (VENTOLIN HFA) 108 (90 Base) MCG/ACT inhaler  Every 6 hours PRN     04/16/19 1252    predniSONE (DELTASONE) 20 MG tablet  Daily     04/16/19 1252           Terrilee Files, MD 04/16/19 1645

## 2019-04-16 NOTE — ED Notes (Addendum)
Pt ambulatory from triage. Triage RN offered pt wheelchair, which was declined.

## 2019-04-16 NOTE — ED Triage Notes (Signed)
Pt complaint of worsening SOB since Saturday unrelieved by home treatment; hx of COPD; denies other symptoms; denies COVID exposures.

## 2019-04-16 NOTE — Discharge Instructions (Addendum)
You were seen in the emergency department for increased shortness of breath.  Your chest x-ray did not show any obvious pneumonia.  This is likely a COPD exacerbation and you were treated with breathing treatments and steroids.  You should continue to use your albuterol inhaler 2 puffs every 4-6 hours and complete your steroids.  He should also consider stopping smoking as this is likely worsening your lung condition.  Your blood pressure was elevated here and you will need to schedule an appointment with your primary care doctor for further management of this.  Please return to the emergency department if any worsening symptoms.

## 2019-04-16 NOTE — ED Notes (Signed)
RT aware that pt needs peak flow checked.

## 2019-04-25 ENCOUNTER — Encounter (HOSPITAL_COMMUNITY): Payer: Self-pay

## 2019-04-25 ENCOUNTER — Other Ambulatory Visit: Payer: Self-pay

## 2019-04-25 ENCOUNTER — Emergency Department (HOSPITAL_COMMUNITY)
Admission: EM | Admit: 2019-04-25 | Discharge: 2019-04-25 | Disposition: A | Discharge status: Home / Self Care | Payer: Self-pay | Attending: Emergency Medicine | Admitting: Emergency Medicine

## 2019-04-25 ENCOUNTER — Emergency Department (HOSPITAL_COMMUNITY): Payer: Self-pay

## 2019-04-25 DIAGNOSIS — J449 Chronic obstructive pulmonary disease, unspecified: Secondary | ICD-10-CM | POA: Insufficient documentation

## 2019-04-25 DIAGNOSIS — R0602 Shortness of breath: Secondary | ICD-10-CM | POA: Insufficient documentation

## 2019-04-25 DIAGNOSIS — R000 Tachycardia, unspecified: Secondary | ICD-10-CM | POA: Insufficient documentation

## 2019-04-25 DIAGNOSIS — Z5321 Procedure and treatment not carried out due to patient leaving prior to being seen by health care provider: Secondary | ICD-10-CM | POA: Insufficient documentation

## 2019-04-25 NOTE — ED Notes (Signed)
Patient refused lab work. States "I am leaving."

## 2019-04-25 NOTE — ED Triage Notes (Signed)
Patient states he has been awakened for the past 2 nights with diaphoresis and sats 82%. Sats in triage 98%. Patient reports a history of COPD. HR-120 in triage. Patient states he did have chest pain 2 days ago, but none today. Denies Covid exposure.

## 2019-04-28 ENCOUNTER — Other Ambulatory Visit: Payer: Self-pay

## 2019-04-28 ENCOUNTER — Encounter (HOSPITAL_COMMUNITY): Payer: Self-pay | Admitting: Emergency Medicine

## 2019-04-28 ENCOUNTER — Emergency Department (HOSPITAL_COMMUNITY): Payer: Self-pay

## 2019-04-28 ENCOUNTER — Inpatient Hospital Stay (HOSPITAL_COMMUNITY)
Admission: EM | Admit: 2019-04-28 | Discharge: 2019-05-02 | DRG: 286 | Disposition: A | Discharge status: Home / Self Care | Payer: Self-pay | Attending: Cardiology | Admitting: Cardiology

## 2019-04-28 DIAGNOSIS — Z791 Long term (current) use of non-steroidal anti-inflammatories (NSAID): Secondary | ICD-10-CM

## 2019-04-28 DIAGNOSIS — I272 Pulmonary hypertension, unspecified: Secondary | ICD-10-CM | POA: Diagnosis present

## 2019-04-28 DIAGNOSIS — I2510 Atherosclerotic heart disease of native coronary artery without angina pectoris: Secondary | ICD-10-CM

## 2019-04-28 DIAGNOSIS — F1110 Opioid abuse, uncomplicated: Secondary | ICD-10-CM | POA: Diagnosis present

## 2019-04-28 DIAGNOSIS — I200 Unstable angina: Secondary | ICD-10-CM

## 2019-04-28 DIAGNOSIS — I471 Supraventricular tachycardia: Secondary | ICD-10-CM | POA: Diagnosis present

## 2019-04-28 DIAGNOSIS — I25110 Atherosclerotic heart disease of native coronary artery with unstable angina pectoris: Principal | ICD-10-CM | POA: Diagnosis present

## 2019-04-28 DIAGNOSIS — T82855A Stenosis of coronary artery stent, initial encounter: Secondary | ICD-10-CM | POA: Diagnosis present

## 2019-04-28 DIAGNOSIS — B1920 Unspecified viral hepatitis C without hepatic coma: Secondary | ICD-10-CM | POA: Diagnosis present

## 2019-04-28 DIAGNOSIS — I110 Hypertensive heart disease with heart failure: Secondary | ICD-10-CM | POA: Diagnosis present

## 2019-04-28 DIAGNOSIS — I252 Old myocardial infarction: Secondary | ICD-10-CM

## 2019-04-28 DIAGNOSIS — R778 Other specified abnormalities of plasma proteins: Secondary | ICD-10-CM

## 2019-04-28 DIAGNOSIS — J449 Chronic obstructive pulmonary disease, unspecified: Secondary | ICD-10-CM

## 2019-04-28 DIAGNOSIS — Z9119 Patient's noncompliance with other medical treatment and regimen: Secondary | ICD-10-CM

## 2019-04-28 DIAGNOSIS — Z20822 Contact with and (suspected) exposure to covid-19: Secondary | ICD-10-CM | POA: Diagnosis present

## 2019-04-28 DIAGNOSIS — Z7982 Long term (current) use of aspirin: Secondary | ICD-10-CM

## 2019-04-28 DIAGNOSIS — Z8261 Family history of arthritis: Secondary | ICD-10-CM

## 2019-04-28 DIAGNOSIS — J441 Chronic obstructive pulmonary disease with (acute) exacerbation: Secondary | ICD-10-CM

## 2019-04-28 DIAGNOSIS — I420 Dilated cardiomyopathy: Secondary | ICD-10-CM

## 2019-04-28 DIAGNOSIS — T380X5A Adverse effect of glucocorticoids and synthetic analogues, initial encounter: Secondary | ICD-10-CM | POA: Diagnosis present

## 2019-04-28 DIAGNOSIS — Z9114 Patient's other noncompliance with medication regimen: Secondary | ICD-10-CM

## 2019-04-28 DIAGNOSIS — I249 Acute ischemic heart disease, unspecified: Secondary | ICD-10-CM | POA: Diagnosis present

## 2019-04-28 DIAGNOSIS — Y831 Surgical operation with implant of artificial internal device as the cause of abnormal reaction of the patient, or of later complication, without mention of misadventure at the time of the procedure: Secondary | ICD-10-CM | POA: Diagnosis present

## 2019-04-28 DIAGNOSIS — R7989 Other specified abnormal findings of blood chemistry: Secondary | ICD-10-CM

## 2019-04-28 DIAGNOSIS — Z79899 Other long term (current) drug therapy: Secondary | ICD-10-CM

## 2019-04-28 DIAGNOSIS — E785 Hyperlipidemia, unspecified: Secondary | ICD-10-CM | POA: Diagnosis present

## 2019-04-28 DIAGNOSIS — Z7952 Long term (current) use of systemic steroids: Secondary | ICD-10-CM

## 2019-04-28 DIAGNOSIS — F17210 Nicotine dependence, cigarettes, uncomplicated: Secondary | ICD-10-CM | POA: Diagnosis present

## 2019-04-28 DIAGNOSIS — I5041 Acute combined systolic (congestive) and diastolic (congestive) heart failure: Secondary | ICD-10-CM

## 2019-04-28 DIAGNOSIS — F1520 Other stimulant dependence, uncomplicated: Secondary | ICD-10-CM | POA: Diagnosis present

## 2019-04-28 HISTORY — DX: Tobacco use: Z72.0

## 2019-04-28 HISTORY — DX: Other psychoactive substance abuse, uncomplicated: F19.10

## 2019-04-28 HISTORY — DX: Patient's noncompliance with other medical treatment and regimen due to unspecified reason: Z91.199

## 2019-04-28 HISTORY — DX: Patient's noncompliance with other medical treatment and regimen: Z91.19

## 2019-04-28 LAB — BASIC METABOLIC PANEL
Anion gap: 11 (ref 5–15)
BUN: 9 mg/dL (ref 6–20)
CO2: 25 mmol/L (ref 22–32)
Calcium: 9 mg/dL (ref 8.9–10.3)
Chloride: 101 mmol/L (ref 98–111)
Creatinine, Ser: 1.02 mg/dL (ref 0.61–1.24)
GFR calc Af Amer: >60 mL/min (ref >60)
GFR calc non Af Amer: >60 mL/min (ref >60)
Glucose, Bld: 109 mg/dL — ABNORMAL HIGH (ref 70–99)
Potassium: 4.3 mmol/L (ref 3.5–5.1)
Sodium: 137 mmol/L (ref 135–145)

## 2019-04-28 LAB — PROTIME-INR
INR: 1 (ref 0.8–1.2)
Prothrombin Time: 13.3 seconds (ref 11.4–15.2)

## 2019-04-28 LAB — CBC
HCT: 47.5 % (ref 39.0–52.0)
Hemoglobin: 15.7 g/dL (ref 13.0–17.0)
MCH: 32.5 pg (ref 26.0–34.0)
MCHC: 33.1 g/dL (ref 30.0–36.0)
MCV: 98.3 fL (ref 80.0–100.0)
Platelets: 242 10*3/uL (ref 150–400)
RBC: 4.83 MIL/uL (ref 4.22–5.81)
RDW: 13.9 % (ref 11.5–15.5)
WBC: 16.3 10*3/uL — ABNORMAL HIGH (ref 4.0–10.5)
nRBC: 0 % (ref 0.0–0.2)

## 2019-04-28 LAB — RESPIRATORY PANEL BY RT PCR (FLU A&B, COVID)
Influenza A by PCR: NEGATIVE
Influenza B by PCR: NEGATIVE
SARS Coronavirus 2 by RT PCR: NEGATIVE

## 2019-04-28 LAB — TROPONIN I (HIGH SENSITIVITY)
Troponin I (High Sensitivity): 43 ng/L — ABNORMAL HIGH (ref <18)
Troponin I (High Sensitivity): 46 ng/L — ABNORMAL HIGH (ref <18)

## 2019-04-28 LAB — D-DIMER, QUANTITATIVE: D-Dimer, Quant: <0.27 ug/mL-FEU (ref 0.00–0.50)

## 2019-04-28 MED ORDER — HEPARIN BOLUS VIA INFUSION
4000.0000 [IU] | Freq: Once | INTRAVENOUS | Status: AC
  Administered 2019-04-28: 23:00:00 4000 [IU] via INTRAVENOUS
  Filled 2019-04-28: qty 4000

## 2019-04-28 MED ORDER — ASPIRIN 81 MG PO CHEW
324.0000 mg | CHEWABLE_TABLET | Freq: Once | ORAL | Status: AC
  Administered 2019-04-28: 22:00:00 324 mg via ORAL
  Filled 2019-04-28: qty 4

## 2019-04-28 MED ORDER — NITROGLYCERIN 0.4 MG SL SUBL: 0.4000 mg | SUBLINGUAL_TABLET | SUBLINGUAL | Status: DC | PRN

## 2019-04-28 MED ORDER — ALBUTEROL SULFATE HFA 108 (90 BASE) MCG/ACT IN AERS
6.0000 | INHALATION_SPRAY | Freq: Once | RESPIRATORY_TRACT | Status: AC
  Administered 2019-04-28: 22:00:00 6 via RESPIRATORY_TRACT
  Filled 2019-04-28: qty 6.7

## 2019-04-28 MED ORDER — HEPARIN (PORCINE) 25000 UT/250ML-% IV SOLN
1400.0000 [IU]/h | INTRAVENOUS | Status: DC
  Administered 2019-04-28: 23:00:00 1150 [IU]/h via INTRAVENOUS
  Administered 2019-04-29: 13:00:00 1250 [IU]/h via INTRAVENOUS
  Filled 2019-04-28 (×3): qty 250

## 2019-04-28 MED ORDER — SODIUM CHLORIDE 0.9% FLUSH
3.0000 mL | Freq: Once | INTRAVENOUS | Status: AC
  Administered 2019-04-28: 22:00:00 3 mL via INTRAVENOUS

## 2019-04-28 MED ORDER — METHYLPREDNISOLONE SODIUM SUCC 125 MG IJ SOLR
125.0000 mg | Freq: Once | INTRAMUSCULAR | Status: AC
  Administered 2019-04-28: 125 mg via INTRAVENOUS
  Filled 2019-04-28: qty 2

## 2019-04-28 NOTE — Progress Notes (Signed)
ANTICOAGULATION CONSULT NOTE - Initial Consult  Pharmacy Consult for Heparin Indication: chest pain/ACS  Allergies  Allergen Reactions  . Ivp Dye [Iodinated Diagnostic Agents] Other (See Comments)    seizures  . Tramadol Other (See Comments)    seizures    Patient Measurements:  IBW: 73.1kg Heparin Dosing Weight: 81.6 kg  Vital Signs: Temp: 97.8 F (36.6 C) (01/09 2001) Temp Source: Oral (01/09 2001) BP: 186/127 (01/09 2001) Pulse Rate: 125 (01/09 2001)  Labs: Recent Labs    04/28/19 2014  HGB 15.7  HCT 47.5  PLT 242  LABPROT 13.3  INR 1.0  CREATININE 1.02  TROPONINIHS 43*    Estimated Creatinine Clearance: 86.6 mL/min (by C-G formula based on SCr of 1.02 mg/dL).   Medical History: Past Medical History:  Diagnosis Date  . COPD (chronic obstructive pulmonary disease) (HCC)   . Coronary artery disease   . Hepatitis-C   . Hypertension   . MI (myocardial infarction) (HCC)     Assessment:  CC/HPI: CP, SOB  PMH: CAD, Stents, COPD, active smoker, h/o Hep C, HTN,  Anticoag: IV heparin for CP. Baseline CBC WNL.  Goal of Therapy:  Heparin level 0.3-0.7 units/ml Monitor platelets by anticoagulation protocol: Yes   Plan:  Heparin 4000 unit IV bolus Heparin infusion 1150 units/hr Daily HL and CBC   Sonali Wivell S. Merilynn Finland, PharmD, BCPS Clinical Staff Pharmacist [email or web address] Merilynn Finland, Keishaun Hazel Stillinger 04/28/2019,10:02 PM

## 2019-04-28 NOTE — ED Provider Notes (Signed)
[name] Provider Note   CSN: [id number] Arrival date & time: 04/28/19  1954     History Chief Complaint  Patient presents with  . Chest Pain    Francisco [name] is a 59 y.o. male.  59yo M w/ PMH including CAD s/p stenting, COPD, hep C, HTN, substance abuse who p/w chest pain and shortness of breath.  Patient states that for the past 1 to 1.5 weeks, he has had progressively worsening shortness of breath and chest pain particularly with exertion.  He states that he is barely able to walk across the room without getting severely dyspneic.  When he gets short of breath like this, he has central chest tightness/heaviness like someone is sitting on his chest.  At first he thought his symptoms were due to COPD, however recently he realized that they may be related to his heart as the chest heaviness it feels like previous heart attack.  Currently has chest pressure is mild. He reports diaphoresis when CP is severe. He reports a mild cough but no fevers, sore throat, vomiting, diarrhea, or body aches.  He has been using his inhaler but has been out of all other medications for a long time.  He does not have a PCP in this area.  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  He denies alcohol use.  The history is provided by the patient.  Chest Pain      Past Medical History:  Diagnosis Date  . COPD (chronic obstructive pulmonary disease) (HCC)   . Coronary artery disease   . Hepatitis-C   . Hypertension   . MI (myocardial infarction) [facility])     Patient Active Problem List   Diagnosis Date Noted  . Acute respiratory failure with hypoxia (HCC) 06/17/2018    Past Surgical History:  Procedure Laterality Date  . BACK SURGERY    . NECK SURGERY    . stints         Family History  Problem Relation Age of Onset  . Rheum arthritis Mother     Social History   Tobacco Use  . Smoking status: Current Every Day Smoker     Packs/day: 0.75    Types: Cigarettes  . Smokeless tobacco: Never Used  Substance Use Topics  . Alcohol use: Never  . Drug use: Yes    Types: Methamphetamines    Comment: used meth and heroin in the past    Home Medications Prior to Admission medications   Medication Sig Start Date End Date Taking? Authorizing Provider  albuterol (VENTOLIN HFA) 108 (90 Base) MCG/ACT inhaler Inhale 1-2 puffs into the lungs every 6 (six) hours as needed for wheezing or shortness of breath. 04/16/19  Yes Francisco Files, MD  aspirin EC 81 MG tablet Take 81 mg by mouth daily.   Yes [provider]  ibuprofen (ADVIL) 200 MG tablet Take 400-600 mg by mouth every 6 (six) hours as needed for moderate pain.   Yes [provider]  amLODipine (NORVASC) 10 MG tablet Take 1 tablet (10 mg total) by mouth daily. Patient not taking: Reported on 11/04/2018 06/20/18   Francisco Mody, MD  ipratropium-albuterol (DUONEB) 0.5-2.5 (3) MG/3ML SOLN Take 3 mLs by nebulization 3 (three) times daily. Patient not taking: Reported on 04/16/2019 06/19/18   Francisco Mody, MD  mometasone-formoterol [facility]) 200-5 MCG/ACT AERO Inhale 2 puffs into the lungs 2 (two) times daily. Patient not taking: Reported on 11/04/2018 06/19/18  Francisco Poisson, MD  nicotine (NICODERM CQ - DOSED IN MG/24 HOURS) 14 mg/24hr patch Place 1 patch (14 mg total) onto the skin daily. Patient not taking: Reported on 11/04/2018 06/20/18   Francisco Poisson, MD  predniSONE (DELTASONE) 20 MG tablet Take 3 tablets (60 mg total) by mouth daily. Patient not taking: Reported on 04/28/2019 04/16/19   Francisco Rasmussen, MD    Allergies    Ivp dye [iodinated diagnostic agents] and Tramadol  Review of Systems   Review of Systems  Cardiovascular: Positive for chest pain.   All other systems reviewed and are negative except that which was mentioned in HPI  Physical Exam Updated Vital Signs BP (!) 176/118   Pulse (!) 113   Temp 97.8 F (36.6 C) (Oral)   Resp  (!) 21   SpO2 94%   Physical Exam Vitals and nursing note reviewed.  Constitutional:      General: He is not in acute distress.    Appearance: He is well-developed.  HENT:     Head: Normocephalic and atraumatic.  Eyes:     Conjunctiva/sclera: Conjunctivae normal.  Cardiovascular:     Rate and Rhythm: Regular rhythm. Tachycardia present.     Heart sounds: Normal heart sounds. No murmur.  Pulmonary:     Comments: Dyspneic with mildly increased WOB but able to speak in full sentences; mild end-expiratory wheezes w/ diminished breath sounds b/l bases Abdominal:     General: Bowel sounds are normal. There is no distension.     Palpations: Abdomen is soft.     Tenderness: There is no abdominal tenderness.  Musculoskeletal:     Cervical back: Neck supple.  Skin:    General: Skin is warm and dry.  Neurological:     Mental Status: He is alert and oriented to person, place, and time.     Comments: Fluent speech  Psychiatric:        Mood and Affect: Mood is anxious.        Judgment: Judgment normal.     ED Results / Procedures / Treatments   Labs (all labs ordered are listed, but only abnormal results are displayed) Labs Reviewed  BASIC METABOLIC PANEL - Abnormal; Notable for the following components:      Result Value   Glucose, Bld 109 (*)    All other components within normal limits  CBC - Abnormal; Notable for the following components:   WBC 16.3 (*)    All other components within normal limits  TROPONIN I (HIGH SENSITIVITY) - Abnormal; Notable for the following components:   Troponin I (High Sensitivity) 43 (*)    All other components within normal limits  TROPONIN I (HIGH SENSITIVITY) - Abnormal; Notable for the following components:   Troponin I (High Sensitivity) 46 (*)    All other components within normal limits  RESPIRATORY PANEL BY RT PCR (FLU A&B, COVID)  PROTIME-INR  D-DIMER, QUANTITATIVE (NOT AT [facility])  HEPARIN LEVEL (UNFRACTIONATED)  CBC     EKG None  Radiology DG Chest 2 View  Result Date: 04/28/2019 CLINICAL DATA:  Chest pain, shortness of breath EXAM: CHEST - 2 VIEW COMPARISON:  04/25/2019 FINDINGS: Heart and mediastinal contours are within normal limits. No focal opacities or effusions. No acute bony abnormality. IMPRESSION: No active cardiopulmonary disease. Electronically Signed   By: Rolm Baptise M.D.   On: 04/28/2019 20:24    Procedures Procedures (including critical care time) CRITICAL CARE Performed by: Wenda Overland Timmy Bubeck   Total critical care time:  30 minutes  Critical care time was exclusive of separately billable procedures and treating other patients.  Critical care was necessary to treat or prevent imminent or life-threatening deterioration.  Critical care was time spent personally by me on the following activities: development of treatment plan with patient and/or surrogate as well as nursing, discussions with consultants, evaluation of patient's response to treatment, examination of patient, obtaining history from patient or surrogate, ordering and performing treatments and interventions, ordering and review of laboratory studies, ordering and review of radiographic studies, pulse oximetry and re-evaluation of patient's condition.  Medications Ordered in ED Medications  nitroGLYCERIN (NITROSTAT) SL tablet 0.4 mg (has no administration in time range)  heparin ADULT infusion 100 units/mL (25000 units/253mL sodium chloride 0.45%) (1,150 Units/hr Intravenous New Bag/Given 04/28/19 2308)  sodium chloride flush (NS) 0.9 % injection 3 mL (3 mLs Intravenous Given 04/28/19 2156)  aspirin chewable tablet 324 mg (324 mg Oral Given 04/28/19 2156)  albuterol (VENTOLIN HFA) 108 (90 Base) MCG/ACT inhaler 6 puff (6 puffs Inhalation Given 04/28/19 2156)  methylPREDNISolone sodium succinate (SOLU-MEDROL) 125 mg/2 mL injection 125 mg (125 mg Intravenous Given 04/28/19 2156)  heparin bolus via infusion 4,000 Units (4,000 Units  Intravenous Bolus from Bag 04/28/19 2310)    ED Course  I have reviewed the triage vital signs and the nursing notes.  Pertinent labs & imaging results that were available during my care of the patient were reviewed by me and considered in my medical decision making (see chart for details).    MDM Rules/Calculators/A&P                     Pt tachycardic and hypertensive at triage, afebrile, O2 sat normal on RA. He had occasional end-expiratory wheezes but no respiratory distress. EKG without acute ischemic changes. Of note, he was evaluated in ED on 12/28 for SOB and treated for COPD exacerbation. At that time, he was denying chest pain. He checked into ED 3 days ago for waking up at night w/ diaphoresis and low O2 sats w/ CP 2 days prior; he left before being seen.  Because of wheezing, gave solumedrol and albuterol, however I am concerned his symptoms may represent unstable angina. Initial trop 43. Gave ASA 324mg  and NTG. WBC 16 which may be related to recent steroid course. CXR clear.   I discussed pt's symptoms and elevated troponin with cardiology, Dr. . She recommended medicine admission and stated cardiology can be consulted in the morning. She agreed w/ plan to initiated heparin drip. Repeat trop is stable at 46.  Discussed admission with [facility], Dr. Mayford Knife. Final Clinical Impression(s) / ED Diagnoses Final diagnoses:  Unstable angina (HCC)  Elevated troponin  Chronic obstructive pulmonary disease, unspecified COPD type [facility])    Rx / DC Orders ED Discharge Orders    None       Janelly Switalski, [facility], MD 04/28/19 2353

## 2019-04-28 NOTE — ED Triage Notes (Signed)
Patient reports central chest pain/tightness with SOB onset this week , no emesis or diaphoresis , history of CAD/Coronary stents /COPD , denies fever or chills .

## 2019-04-28 NOTE — H&P (Signed)
History and Physical    Francisco [name] NIO:[id number] DOB: October 17, 1960 DOA: 04/28/2019  PCP: Patient, No Pcp Per  Patient coming from: [facility]    Chief Complaint: SOB/DOE  HPI: Francisco [name] is a 59 y.o. male with medical history significant of COPD, CAD s/p PCI (5 yrs prior, in [place]), medication non-compliance, Hep C, IVDU (previously heroin, now meth), Tobacco use who presents with ongoing SOB/Chest pressure  Patient was seen in ER on 12/28, but reports onset of his symptoms which he currently presents with again has largely unimproved since 12/25.  He states he has been wheezing and with SOB since 12/25 but he feels his bigger concern is that he cannot walk more than 5 feet before being completely spent.  He reports feeling as though someone is sitting on his chest as well.  He states he has a dry cough but no productive sputum.  He does not report and fevers or chills.  He does get diaphoretic with episodes.  No nausea/vomiting or radiation of pain reported.  He smokes currently down to 1/4 ppd and was at 2 ppd as recently as 3 months prior.  He does use meth, IV.  He denies alcohol use.  He does report increased meth use around Christmas and time of his worsening symptoms.  He reports due to how unable he is to get around, friends at the [employer] have been delivering him meals.  He denies any orthopnea or PND.    Review of Systems: As per HPI otherwise 10 point review of systems negative.    Past Medical History:  Diagnosis Date  . COPD (chronic obstructive pulmonary disease) ([facility])   . Coronary artery disease   . Hepatitis-C   . Hypertension   . MI (myocardial infarction) [facility])     Past Surgical History:  Procedure Laterality Date  . BACK SURGERY    . NECK SURGERY    . stints       reports that he has been smoking cigarettes. He has been smoking about 0.75 packs per day. He has never used smokeless tobacco. He reports current drug use. Drug:  Methamphetamines. He reports that he does not drink alcohol.  Allergies  Allergen Reactions  . Ivp Dye [Iodinated Diagnostic Agents] Other (See Comments)    seizures  . Tramadol Other (See Comments)    seizures    Family History  Problem Relation Age of Onset  . Rheum arthritis Mother      Prior to Admission medications   Medication Sig Start Date End Date Taking? Authorizing Provider  albuterol (VENTOLIN HFA) 108 (90 Base) MCG/ACT inhaler Inhale 1-2 puffs into the lungs every 6 (six) hours as needed for wheezing or shortness of breath. 04/16/19  Yes Hayden Rasmussen, MD  aspirin EC 81 MG tablet Take 81 mg by mouth daily.   Yes [provider]  ibuprofen (ADVIL) 200 MG tablet Take 400-600 mg by mouth every 6 (six) hours as needed for moderate pain.   Yes [provider]  amLODipine (NORVASC) 10 MG tablet Take 1 tablet (10 mg total) by mouth daily. Patient not taking: Reported on 11/04/2018 06/20/18   Hosie Poisson, MD  ipratropium-albuterol (DUONEB) 0.5-2.5 (3) MG/3ML SOLN Take 3 mLs by nebulization 3 (three) times daily. Patient not taking: Reported on 04/16/2019 06/19/18   Hosie Poisson, MD  mometasone-formoterol [facility]) 200-5 MCG/ACT AERO Inhale 2 puffs into the lungs 2 (two) times daily. Patient not taking: Reported on 11/04/2018 06/19/18  Kathlen Mody, MD  nicotine (NICODERM CQ - DOSED IN MG/24 HOURS) 14 mg/24hr patch Place 1 patch (14 mg total) onto the skin daily. Patient not taking: Reported on 11/04/2018 06/20/18   Kathlen Mody, MD  predniSONE (DELTASONE) 20 MG tablet Take 3 tablets (60 mg total) by mouth daily. Patient not taking: Reported on 04/28/2019 04/16/19   Terrilee Files, MD    Physical Exam: Vitals:   04/28/19 2001 04/28/19 2200 04/28/19 2245 04/28/19 2315  BP: (!) 186/127 (!) 164/89 (!) 164/89 (!) 176/118  Pulse: (!) 125 (!) 109 (!) 114 (!) 113  Resp: 18 20 19  (!) 21  Temp: 97.8 F (36.6 C)     TempSrc: Oral     SpO2: 98% 97% 94% 94%     Vitals:   04/28/19 2001 04/28/19 2200 04/28/19 2245 04/28/19 2315  BP: (!) 186/127 (!) 164/89 (!) 164/89 (!) 176/118  Pulse: (!) 125 (!) 109 (!) 114 (!) 113  Resp: 18 20 19  (!) 21  Temp: 97.8 F (36.6 C)     TempSrc: Oral     SpO2: 98% 97% 94% 94%     Constitutional: NAD, calm, comfortable Eyes: PERRL, lids and conjunctivae normal ENMT: Mucous membranes are moist. Posterior pharynx clear of any exudate or lesions.Normal dentition.  Neck: normal, supple, no masses, no thyromegaly Respiratory: clear to auscultation bilaterally, no wheezing appreciated at this time, but reports earlier he was much worse Cardiovascular: Regular rate and rhythm, no murmurs / rubs / gallops. No extremity edema. 2+ pedal pulses. No carotid bruits.  Abdomen: no tenderness, no masses palpated. No hepatosplenomegaly. Bowel sounds positive.  Musculoskeletal: no clubbing / cyanosis. No joint deformity upper and lower extremities. Good ROM, no contractures. Normal muscle tone.  Skin: no rashes, lesions, ulcers. No induration Neurologic: CN 2-12 grossly intact. Sensation and strength intact Psychiatric: Normal judgment and insight. Alert and oriented x 3. Normal mood.   Labs on Admission: I have personally reviewed following labs and imaging studies  CBC: Recent Labs  Lab 04/28/19 2014  WBC 16.3*  HGB 15.7  HCT 47.5  MCV 98.3  PLT 242   Basic Metabolic Panel: Recent Labs  Lab 04/28/19 2014  NA 137  K 4.3  CL 101  CO2 25  GLUCOSE 109*  BUN 9  CREATININE 1.02  CALCIUM 9.0   GFR: Estimated Creatinine Clearance: 86.6 mL/min (by C-G formula based on SCr of 1.02 mg/dL). Liver Function Tests: No results for input(s): AST, ALT, ALKPHOS, BILITOT, PROT, ALBUMIN in the last 168 hours. No results for input(s): LIPASE, AMYLASE in the last 168 hours. No results for input(s): AMMONIA in the last 168 hours. Coagulation Profile: Recent Labs  Lab 04/28/19 2014  INR 1.0   Cardiac Enzymes: No  results for input(s): CKTOTAL, CKMB, CKMBINDEX, TROPONINI in the last 168 hours. BNP (last 3 results) No results for input(s): PROBNP in the last 8760 hours. HbA1C: No results for input(s): HGBA1C in the last 72 hours. CBG: No results for input(s): GLUCAP in the last 168 hours. Lipid Profile: No results for input(s): CHOL, HDL, LDLCALC, TRIG, CHOLHDL, LDLDIRECT in the last 72 hours. Thyroid Function Tests: No results for input(s): TSH, T4TOTAL, FREET4, T3FREE, THYROIDAB in the last 72 hours. Anemia Panel: No results for input(s): VITAMINB12, FOLATE, FERRITIN, TIBC, IRON, RETICCTPCT in the last 72 hours. Urine analysis:    Component Value Date/Time   COLORURINE YELLOW 06/16/2018 2222   APPEARANCEUR HAZY (A) 06/16/2018 2222   LABSPEC 1.017 06/16/2018 2222  PHURINE 7.0 06/16/2018 2222   GLUCOSEU NEGATIVE 06/16/2018 2222   HGBUR NEGATIVE 06/16/2018 2222   BILIRUBINUR NEGATIVE 06/16/2018 2222   KETONESUR NEGATIVE 06/16/2018 2222   PROTEINUR NEGATIVE 06/16/2018 2222   NITRITE NEGATIVE 06/16/2018 2222   LEUKOCYTESUR NEGATIVE 06/16/2018 2222    Radiological Exams on Admission: DG Chest 2 View  Result Date: 04/28/2019 CLINICAL DATA:  Chest pain, shortness of breath EXAM: CHEST - 2 VIEW COMPARISON:  04/25/2019 FINDINGS: Heart and mediastinal contours are within normal limits. No focal opacities or effusions. No acute bony abnormality. IMPRESSION: No active cardiopulmonary disease. Electronically Signed   By: Charlett Nose M.D.   On: 04/28/2019 20:24    EKG: Independently reviewed.   Assessment/Plan Shiro Ellerman is a 59 y.o. male with medical history significant of COPD, CAD s/p PCI (5 yrs prior, in [place]), medication non-compliance, Hep C, IVDU (previously heroin, now meth), Tobacco use who presents with ongoing SOB.  # CAD/UA - patient with worsening symptoms in the past two weeks, concern for ACS/Unstable Angina though this all appears and likely precipitated by ongoing and  increased Methamphetamine use.  However, patient has also been non-compliant with cardiac meds since PCI (reported to be 5 years ago) - started carvedilol, atorvastatin, and lisinopril - nitro paste, prn SL - Dr. Mayford Knife of Cardiology was consulted and spoke with ER, agreed with plan to continue hep gtt - will trend trops (first two do not show peak) - NPO midnight  # COPD - do not suspect acute exac, in absence of productive sputum or acute worsening.  Patient does report much worse earlier - for now continue inhalers and steroids (held off on abx) - leukocytosis likely steroid induced, diff not obtained but can cross-evaluate in AM for neutrophilia with eosinopenia  # Hep C - not treated - needs PCP/GI follow-up and abstinence from substance abuse  # Methamphetamine abuse - patient counseled extensively, aware of cardiac harm - at a sober living house - he is wishing to quit - social work consulted  # Tobacco use disorder - counseled, he is weaning, now down 1/4 ppd - he reports he is cuting back on his own but needs to establish care with PCP, recently moved to [facility]  DVT prophylaxis: Heparin gtt Code Status: Full Consults called: Cardiology, Dr. Mayford Knife Admission status: tele   Clydia Llano MD [facility] Pager [phone]  If 7PM-7AM, please contact night-coverage [email or web address] Password [facility]  04/28/2019, 11:49 PM

## 2019-04-29 ENCOUNTER — Inpatient Hospital Stay (HOSPITAL_COMMUNITY): Payer: Self-pay

## 2019-04-29 ENCOUNTER — Encounter (HOSPITAL_COMMUNITY): Payer: Self-pay | Admitting: Internal Medicine

## 2019-04-29 DIAGNOSIS — I2510 Atherosclerotic heart disease of native coronary artery without angina pectoris: Secondary | ICD-10-CM

## 2019-04-29 DIAGNOSIS — E785 Hyperlipidemia, unspecified: Secondary | ICD-10-CM

## 2019-04-29 DIAGNOSIS — I249 Acute ischemic heart disease, unspecified: Secondary | ICD-10-CM | POA: Diagnosis present

## 2019-04-29 DIAGNOSIS — I5021 Acute systolic (congestive) heart failure: Secondary | ICD-10-CM

## 2019-04-29 DIAGNOSIS — R778 Other specified abnormalities of plasma proteins: Secondary | ICD-10-CM

## 2019-04-29 DIAGNOSIS — F1910 Other psychoactive substance abuse, uncomplicated: Secondary | ICD-10-CM

## 2019-04-29 DIAGNOSIS — I429 Cardiomyopathy, unspecified: Secondary | ICD-10-CM

## 2019-04-29 DIAGNOSIS — Z720 Tobacco use: Secondary | ICD-10-CM

## 2019-04-29 DIAGNOSIS — R079 Chest pain, unspecified: Secondary | ICD-10-CM

## 2019-04-29 LAB — CBC
HCT: 49.3 % (ref 39.0–52.0)
Hemoglobin: 16.2 g/dL (ref 13.0–17.0)
MCH: 32.4 pg (ref 26.0–34.0)
MCHC: 32.9 g/dL (ref 30.0–36.0)
MCV: 98.6 fL (ref 80.0–100.0)
Platelets: 250 10*3/uL (ref 150–400)
RBC: 5 MIL/uL (ref 4.22–5.81)
RDW: 14.1 % (ref 11.5–15.5)
WBC: 14.3 10*3/uL — ABNORMAL HIGH (ref 4.0–10.5)
nRBC: 0 % (ref 0.0–0.2)

## 2019-04-29 LAB — LIPID PANEL
Cholesterol: 215 mg/dL — ABNORMAL HIGH (ref 0–200)
HDL: 93 mg/dL (ref >40)
LDL Cholesterol: 111 mg/dL — ABNORMAL HIGH (ref 0–99)
Total CHOL/HDL Ratio: 2.3 RATIO
Triglycerides: 57 mg/dL (ref <150)
VLDL: 11 mg/dL (ref 0–40)

## 2019-04-29 LAB — RAPID URINE DRUG SCREEN, HOSP PERFORMED
Amphetamines: POSITIVE — AB
Barbiturates: NOT DETECTED
Benzodiazepines: NOT DETECTED
Cocaine: NOT DETECTED
Opiates: NOT DETECTED
Tetrahydrocannabinol: NOT DETECTED

## 2019-04-29 LAB — HEPARIN LEVEL (UNFRACTIONATED)
Heparin Unfractionated: 0.29 IU/mL — ABNORMAL LOW (ref 0.30–0.70)
Heparin Unfractionated: 0.34 IU/mL (ref 0.30–0.70)
Heparin Unfractionated: 0.27 IU/mL — ABNORMAL LOW (ref 0.30–0.70)

## 2019-04-29 LAB — HEMOGLOBIN A1C
Hgb A1c MFr Bld: 5.5 % (ref 4.8–5.6)
Mean Plasma Glucose: 111.15 mg/dL

## 2019-04-29 LAB — ECHOCARDIOGRAM COMPLETE
Height: 72 in
Weight: 3051.2 oz

## 2019-04-29 LAB — BRAIN NATRIURETIC PEPTIDE: B Natriuretic Peptide: 682.1 pg/mL — ABNORMAL HIGH (ref 0.0–100.0)

## 2019-04-29 LAB — TROPONIN I (HIGH SENSITIVITY)
Troponin I (High Sensitivity): 45 ng/L — ABNORMAL HIGH (ref <18)
Troponin I (High Sensitivity): 37 ng/L — ABNORMAL HIGH (ref <18)

## 2019-04-29 MED ORDER — ACETAMINOPHEN 650 MG RE SUPP: 650.0000 mg | Freq: Four times a day (QID) | RECTAL | Status: DC | PRN

## 2019-04-29 MED ORDER — ASPIRIN EC 81 MG PO TBEC
81.0000 mg | DELAYED_RELEASE_TABLET | Freq: Every day | ORAL | Status: DC
  Administered 2019-04-29 – 2019-05-02 (×3): 81 mg via ORAL
  Filled 2019-04-29 (×3): qty 1

## 2019-04-29 MED ORDER — ALBUTEROL SULFATE HFA 108 (90 BASE) MCG/ACT IN AERS
1.0000 | INHALATION_SPRAY | Freq: Four times a day (QID) | RESPIRATORY_TRACT | Status: DC | PRN
  Filled 2019-04-29: qty 6.7

## 2019-04-29 MED ORDER — NITROGLYCERIN 2 % TD OINT
0.5000 [in_us] | TOPICAL_OINTMENT | Freq: Four times a day (QID) | TRANSDERMAL | Status: DC
  Administered 2019-04-29 – 2019-05-01 (×4): 0.5 [in_us] via TOPICAL
  Filled 2019-04-29: qty 30
  Filled 2019-04-29: qty 1

## 2019-04-29 MED ORDER — SODIUM CHLORIDE 0.9 % IV SOLN: 500.0000 mg | INTRAVENOUS | Status: DC

## 2019-04-29 MED ORDER — IPRATROPIUM-ALBUTEROL 0.5-2.5 (3) MG/3ML IN SOLN: 3.0000 mL | Freq: Four times a day (QID) | RESPIRATORY_TRACT | Status: DC | PRN

## 2019-04-29 MED ORDER — ATORVASTATIN CALCIUM 80 MG PO TABS
80.0000 mg | ORAL_TABLET | Freq: Every day | ORAL | Status: DC
  Administered 2019-04-29 – 2019-05-01 (×3): 80 mg via ORAL
  Filled 2019-04-29 (×3): qty 1

## 2019-04-29 MED ORDER — IPRATROPIUM-ALBUTEROL 0.5-2.5 (3) MG/3ML IN SOLN: 3.0000 mL | Freq: Three times a day (TID) | RESPIRATORY_TRACT | Status: DC

## 2019-04-29 MED ORDER — NICOTINE 14 MG/24HR TD PT24
14.0000 mg | MEDICATED_PATCH | Freq: Every day | TRANSDERMAL | Status: AC
  Administered 2019-04-29 – 2019-05-01 (×3): 14 mg via TRANSDERMAL
  Filled 2019-04-29 (×3): qty 1

## 2019-04-29 MED ORDER — SODIUM CHLORIDE 0.9 % IV SOLN: 250.0000 mL | INTRAVENOUS | Status: DC | PRN

## 2019-04-29 MED ORDER — PREDNISONE 20 MG PO TABS: 40.0000 mg | ORAL_TABLET | Freq: Every day | ORAL | Status: DC

## 2019-04-29 MED ORDER — ACETAMINOPHEN 325 MG PO TABS: 650.0000 mg | ORAL_TABLET | Freq: Four times a day (QID) | ORAL | Status: DC | PRN

## 2019-04-29 MED ORDER — ASPIRIN 81 MG PO CHEW: 81.0000 mg | CHEWABLE_TABLET | Freq: Every day | ORAL | Status: DC

## 2019-04-29 MED ORDER — SODIUM CHLORIDE 0.9% FLUSH: 3.0000 mL | INTRAVENOUS | Status: DC | PRN

## 2019-04-29 MED ORDER — ASPIRIN 81 MG PO CHEW
81.0000 mg | CHEWABLE_TABLET | ORAL | Status: AC
  Administered 2019-04-30: 05:00:00 81 mg via ORAL
  Filled 2019-04-29: qty 1

## 2019-04-29 MED ORDER — MOMETASONE FURO-FORMOTEROL FUM 200-5 MCG/ACT IN AERO: 2.0000 | INHALATION_SPRAY | Freq: Two times a day (BID) | RESPIRATORY_TRACT | Status: DC

## 2019-04-29 MED ORDER — SODIUM CHLORIDE 0.9% FLUSH
3.0000 mL | Freq: Two times a day (BID) | INTRAVENOUS | Status: DC
  Administered 2019-04-30 – 2019-05-02 (×2): 3 mL via INTRAVENOUS

## 2019-04-29 MED ORDER — SODIUM CHLORIDE 0.9 % IV SOLN
500.0000 mg | Freq: Once | INTRAVENOUS | Status: AC
  Administered 2019-04-29: 500 mg via INTRAVENOUS
  Filled 2019-04-29: qty 500

## 2019-04-29 MED ORDER — CARVEDILOL 6.25 MG PO TABS
6.2500 mg | ORAL_TABLET | Freq: Two times a day (BID) | ORAL | Status: DC
  Administered 2019-04-29 (×2): 6.25 mg via ORAL
  Filled 2019-04-29 (×2): qty 1

## 2019-04-29 MED ORDER — LISINOPRIL 5 MG PO TABS
5.0000 mg | ORAL_TABLET | Freq: Every day | ORAL | Status: DC
  Administered 2019-04-29: 10:00:00 5 mg via ORAL
  Filled 2019-04-29: qty 1

## 2019-04-29 MED ORDER — SODIUM CHLORIDE 0.9 % WEIGHT BASED INFUSION
1.0000 mL/kg/h | INTRAVENOUS | Status: DC
  Administered 2019-04-29: 20:00:00 1 mL/kg/h via INTRAVENOUS

## 2019-04-29 MED ORDER — ALBUTEROL SULFATE (2.5 MG/3ML) 0.083% IN NEBU: 3.0000 mL | INHALATION_SOLUTION | Freq: Four times a day (QID) | RESPIRATORY_TRACT | Status: DC | PRN

## 2019-04-29 NOTE — Progress Notes (Signed)
ANTICOAGULATION CONSULT NOTE   Pharmacy Consult for Heparin Indication: chest pain/ACS  Allergies  Allergen Reactions  . Ivp Dye [Iodinated Diagnostic Agents] Other (See Comments)    seizures  . Tramadol Other (See Comments)    seizures    Patient Measurements: Height: 6' (182.9 cm) Weight: 190 lb 11.2 oz (86.5 kg) IBW/kg (Calculated) : 77.6 Heparin Dosing Weight: 86.5 kg  Vital Signs: Temp: 97.7 F (36.5 C) (01/10 1518) Temp Source: Oral (01/10 1518) BP: 115/82 (01/10 1518) Pulse Rate: 103 (01/10 1518)  Labs: Recent Labs    04/28/19 2014 04/28/19 2153 04/29/19 0023 04/29/19 0303 04/29/19 0948 04/29/19 1529  HGB 15.7  --   --  16.2  --   --   HCT 47.5  --   --  49.3  --   --   PLT 242  --   --  250  --   --   LABPROT 13.3  --   --   --   --   --   INR 1.0  --   --   --   --   --   HEPARINUNFRC  --   --   --  0.29* 0.34 0.27*  CREATININE 1.02  --   --   --   --   --   TROPONINIHS 43* 46* 45* 37*  --   --     Estimated Creatinine Clearance: 86.6 mL/min (by C-G formula based on SCr of 1.02 mg/dL).   Medical History: Past Medical History:  Diagnosis Date  . COPD (chronic obstructive pulmonary disease) ([facility])   . Coronary artery disease    a. s/p prior PCI.  Marland Kitchen Hepatitis-C   . Hypertension   . IV drug abuse ([facility])    a. 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  . Medically noncompliant   . MI (myocardial infarction) ([facility])   . Tobacco abuse     Assessment: 65 yom admitted with chest pain. Pharmacy to dose heparin. No anticoagulation PTA.   Heparin level is therapeutic at 0.34 after a rate increase to 1250 units/hour. H&H is stable at 16.2/49.3, plts wnl at 250. Negative d-dimer with positive troponins.   PM follow up: heparin level now slightly below goal.  No overt bleeding or complications noted.   Goal of Therapy:  Heparin level 0.3-0.7 units/ml Monitor platelets by anticoagulation protocol: Yes   Plan:  Increase IV heparin to 1400  units/hr. Daily heparin level and CBC.  Jenetta Downer, [facility] Clinical Pharmacist Phone [phone]  04/29/2019 4:52 PM

## 2019-04-29 NOTE — Consult Note (Signed)
Cardiology Consultation:   Patient ID: Francisco [name]; [id number]; 1960-07-13   Admit date: 04/28/2019 Date of Consult: 04/29/2019  Primary Care Provider: Patient, No Pcp Per Primary Cardiologist: No primary care provider on file. - New to Dr. Ladona Ridgel Primary Electrophysiologist:  None  Chief Complaint: chest pain, SOB  Patient Profile:   Francisco [name] is a 59 y.o. male with a hx of COPD, CAD s/p PCI (5 yrs prior, in [place]), medication non-compliance, Hep C (not treated per notes), IVDU (previously heroin, now meth), tobacco use who is being seen today for the evaluation of chest pressure and abnormal echocardiogram at the request of Dr. Karren Burly  History of Present Illness:   He was seen in the ED 04/16/19 with increasing SOB x3 days and nonproductive cough, treated as COPD exacerbation, sent home with prednisone. Seen in ED 04/25/19 but LWBS.  Admitting vitals demonstrated sinus tachycardia (HR 109-125) and hypertension (peak 176/118), last check 156/112. HsTroponin 43->46->45->37, BNP 682, LDL 11, BMET OK except glucose mildly elevated (A1C 5.5), d-dimer negative, leukocytosis (16.3->14.3). UDS + amphetamines, respiratory panel (including Covid test) is negative. CXR NAD. 2D Echo shows EF 25-30% with global HK, severely decreased function, mild LVH, grade III DD, mildly dilated LA, mild AV sclerosis without stenosis. He lievs at [facility].  He was started on lisinopril and carvedilol. The patient notes that his symptoms are just like they were prior to previous stent placements with sob and chest pressure with exertion. He has class 3 CHF symptoms over the past few weeks.   Past Medical History:  Diagnosis Date  . COPD (chronic obstructive pulmonary disease) (HCC)   . Coronary artery disease    a. s/p prior PCI.  Marland Kitchen Hepatitis-C   . Hypertension   . IV drug abuse (HCC)    a. 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  . Medically noncompliant   . MI (myocardial  infarction) (HCC)   . Tobacco abuse     Past Surgical History:  Procedure Laterality Date  . BACK SURGERY    . NECK SURGERY    . stints       Inpatient Medications: Scheduled Meds: . aspirin EC  81 mg Oral Daily  . atorvastatin  80 mg Oral q1800  . carvedilol  6.25 mg Oral BID WC  . lisinopril  5 mg Oral Daily  . nitroGLYCERIN  0.5 inch Topical Q6H   Continuous Infusions: . heparin 1,250 Units/hr (04/29/19 1304)   PRN Meds: acetaminophen **OR** acetaminophen, ipratropium-albuterol, nitroGLYCERIN  Home Meds: Prior to Admission medications   Medication Sig Start Date End Date Taking? Authorizing Provider  albuterol (VENTOLIN HFA) 108 (90 Base) MCG/ACT inhaler Inhale 1-2 puffs into the lungs every 6 (six) hours as needed for wheezing or shortness of breath. 04/16/19  Yes Terrilee Files, MD  aspirin EC 81 MG tablet Take 81 mg by mouth daily.   Yes [provider]  ibuprofen (ADVIL) 200 MG tablet Take 400-600 mg by mouth every 6 (six) hours as needed for moderate pain.   Yes [provider]  amLODipine (NORVASC) 10 MG tablet Take 1 tablet (10 mg total) by mouth daily. Patient not taking: Reported on 11/04/2018 06/20/18   Kathlen Mody, MD  ipratropium-albuterol (DUONEB) 0.5-2.5 (3) MG/3ML SOLN Take 3 mLs by nebulization 3 (three) times daily. Patient not taking: Reported on 04/16/2019 06/19/18   Kathlen Mody, MD  mometasone-formoterol [facility]) 200-5 MCG/ACT AERO Inhale 2 puffs into the lungs 2 (two) times daily. Patient not taking: Reported  on 11/04/2018 06/19/18   Hosie Poisson, MD  nicotine (NICODERM CQ - DOSED IN MG/24 HOURS) 14 mg/24hr patch Place 1 patch (14 mg total) onto the skin daily. Patient not taking: Reported on 11/04/2018 06/20/18   Hosie Poisson, MD  predniSONE (DELTASONE) 20 MG tablet Take 3 tablets (60 mg total) by mouth daily. Patient not taking: Reported on 04/28/2019 04/16/19   Hayden Rasmussen, MD    Allergies:    Allergies  Allergen Reactions    . Ivp Dye [Iodinated Diagnostic Agents] Other (See Comments)    seizures  . Tramadol Other (See Comments)    seizures    Social History:   Social History   Socioeconomic History  . Marital status: Divorced    Spouse name: Not on file  . Number of children: Not on file  . Years of education: Not on file  . Highest education level: Not on file  Occupational History  . Not on file  Tobacco Use  . Smoking status: Current Every Day Smoker    Packs/day: 0.75    Types: Cigarettes  . Smokeless tobacco: Never Used  Substance and Sexual Activity  . Alcohol use: Never  . Drug use: Yes    Types: Methamphetamines    Comment: used meth and heroin in the past  . Sexual activity: Not on file  Other Topics Concern  . Not on file  Social History Narrative  . Not on file   Social Determinants of Health   Financial Resource Strain:   . Difficulty of Paying Living Expenses: Not on file  Food Insecurity:   . Worried About Charity fundraiser in the Last Year: Not on file  . Ran Out of Food in the Last Year: Not on file  Transportation Needs:   . Lack of Transportation (Medical): Not on file  . Lack of Transportation (Non-Medical): Not on file  Physical Activity:   . Days of Exercise per Week: Not on file  . Minutes of Exercise per Session: Not on file  Stress:   . Feeling of Stress : Not on file  Social Connections:   . Frequency of Communication with Friends and Family: Not on file  . Frequency of Social Gatherings with Friends and Family: Not on file  . Attends Religious Services: Not on file  . Active Member of Clubs or Organizations: Not on file  . Attends Archivist Meetings: Not on file  . Marital Status: Not on file  Intimate Partner Violence:   . Fear of Current or Ex-Partner: Not on file  . Emotionally Abused: Not on file  . Physically Abused: Not on file  . Sexually Abused: Not on file     Family History:    Family History  Problem Relation Age of  Onset  . Rheum arthritis Mother       ROS:  Please see the history of present illness.   All other ROS reviewed and negative.     Physical Exam/Data:   Vitals:   04/29/19 0145 04/29/19 0207 04/29/19 0209 04/29/19 1011  BP: (!) 137/106  (!) 170/124 (!) 156/112  Pulse: (!) 108  (!) 111 (!) 106  Resp: (!) 22  (!) 22   Temp:   98.1 F (36.7 C)   TempSrc:   Oral   SpO2: 98%  98%   Weight:  86.5 kg    Height:  6' (1.829 m)      Intake/Output Summary (Last 24 hours) at 04/29/2019 1306 Last  data filed at 04/29/2019 0832 Gross per 24 hour  Intake 250.31 ml  Output --  Net 250.31 ml   Last 3 Weights 04/29/2019 04/25/2019 04/16/2019  Weight (lbs) 190 lb 11.2 oz 180 lb 180 lb  Weight (kg) 86.501 kg 81.647 kg 81.647 kg     Body mass index is 25.86 kg/m.  General: Well developed, well nourished, in no acute distress. Head: Normocephalic, atraumatic, sclera non-icteric, no xanthomas, nares are without discharge.  Neck: Negative for carotid bruits. JVD not elevated. Lungs: Clear bilaterally to auscultation without wheezes, rales, or rhonchi. Breathing is unlabored. Heart: RRR with S1 S2. No murmurs, rubs, or gallops appreciated. Abdomen: Soft, non-tender, non-distended with normoactive bowel sounds. No hepatomegaly. No rebound/guarding. No obvious abdominal masses. Msk:  Strength and tone appear normal for age. Extremities: No clubbing or cyanosis. No edema.  Distal pedal pulses are 2+ and equal bilaterally. Neuro: Alert and oriented X 3. No facial asymmetry. No focal deficit. Moves all extremities spontaneously. Psych:  Responds to questions appropriately with a normal affect.   EKG:  The EKG was personally reviewed and demonstrates:  sinus tachycardia 123bpm, right atrial enlargement, nonspecific STT changes (subtle ST depression inferiorly, TWI V5-V6)  Telemetry:  Telemetry was personally reviewed and demonstrates:  nsr  Relevant CV Studies: 2D echo reveals EF 25%  Laboratory  Data:  High Sensitivity Troponin:   Recent Labs  Lab 04/28/19 2014 04/28/19 2153 04/29/19 0023 04/29/19 0303  TROPONINIHS 43* 46* 45* 37*     Cardiac EnzymesNo results for input(s): TROPONINI in the last 168 hours. No results for input(s): TROPIPOC in the last 168 hours.  Chemistry Recent Labs  Lab 04/28/19 2014  NA 137  K 4.3  CL 101  CO2 25  GLUCOSE 109*  BUN 9  CREATININE 1.02  CALCIUM 9.0  GFRNONAA >60  GFRAA >60  ANIONGAP 11    No results for input(s): PROT, ALBUMIN, AST, ALT, ALKPHOS, BILITOT in the last 168 hours. Hematology Recent Labs  Lab 04/28/19 2014 04/29/19 0303  WBC 16.3* 14.3*  RBC 4.83 5.00  HGB 15.7 16.2  HCT 47.5 49.3  MCV 98.3 98.6  MCH 32.5 32.4  MCHC 33.1 32.9  RDW 13.9 14.1  PLT 242 250   BNP Recent Labs  Lab 04/29/19 0303  BNP 682.1*    DDimer  Recent Labs  Lab 04/28/19 2128  DDIMER <0.27     Radiology/Studies:  DG Chest 2 View  Result Date: 04/28/2019 CLINICAL DATA:  Chest pain, shortness of breath EXAM: CHEST - 2 VIEW COMPARISON:  04/25/2019 FINDINGS: Heart and mediastinal contours are within normal limits. No focal opacities or effusions. No acute bony abnormality. IMPRESSION: No active cardiopulmonary disease. Electronically Signed   By: Charlett Nose M.D.   On: 04/28/2019 20:24   ECHOCARDIOGRAM COMPLETE  Result Date: 04/29/2019   ECHOCARDIOGRAM REPORT   Patient Name:   Francisco [name] Date of Exam: 04/29/2019 Medical Rec #:  [id number]   Height:       72.0 in Accession #:    [id number]  Weight:       190.7 lb Date of Birth:  04-14-1961   BSA:          2.09 m Patient Age:    58 years    BP:           156/112 mmHg Patient Gender: M           HR:           108 bpm.  Exam Location:  Inpatient Procedure: 2D Echo, Cardiac Doppler and Color Doppler Indications:    CAD of Native Vessel 414.01/I25.10  History:        Patient has no prior history of Echocardiogram examinations.                 Previous Myocardial Infarction, COPD; Risk  Factors:Hypertension.  Sonographer:    Elmarie Shiley Dance Referring Phys: [id number] ARAVIND CHANDRA IMPRESSIONS  1. Left ventricular ejection fraction, by visual estimation, is 25 to 30%. The left ventricle has severely decreased function. There is mildly increased left ventricular hypertrophy.  2. Left ventricular diastolic parameters are consistent with Grade III diastolic dysfunction (restrictive).  3. The left ventricle demonstrates global hypokinesis.  4. Global right ventricle has normal systolic function.The right ventricular size is normal.  5. Left atrial size was mildly dilated.  6. Right atrial size was normal.  7. The mitral valve is normal in structure. Trivial mitral valve regurgitation. No evidence of mitral stenosis.  8. The tricuspid valve is normal in structure.  9. The aortic valve is tricuspid. Aortic valve regurgitation is not visualized. Mild aortic valve sclerosis without stenosis. 10. The pulmonic valve was normal in structure. Pulmonic valve regurgitation is not visualized. 11. The inferior vena cava is normal in size with greater than 50% respiratory variability, suggesting right atrial pressure of 3 mmHg. 12. Severe global reduction in LV systolic function; restrictive filling; mild LVH; mild LAE. FINDINGS  Left Ventricle: Left ventricular ejection fraction, by visual estimation, is 25 to 30%. The left ventricle has severely decreased function. The left ventricle demonstrates global hypokinesis. There is mildly increased left ventricular hypertrophy. Left ventricular diastolic parameters are consistent with Grade III diastolic dysfunction (restrictive). Normal left atrial pressure. Right Ventricle: The right ventricular size is normal.Global RV systolic function is has normal systolic function. Left Atrium: Left atrial size was mildly dilated. Right Atrium: Right atrial size was normal in size Pericardium: There is no evidence of pericardial effusion. Mitral Valve: The mitral valve is normal in  structure. Trivial mitral valve regurgitation. No evidence of mitral valve stenosis by observation. Tricuspid Valve: The tricuspid valve is normal in structure. Tricuspid valve regurgitation is trivial. Aortic Valve: The aortic valve is tricuspid. Aortic valve regurgitation is not visualized. Mild aortic valve sclerosis is present, with no evidence of aortic valve stenosis. Pulmonic Valve: The pulmonic valve was normal in structure. Pulmonic valve regurgitation is not visualized. Pulmonic regurgitation is not visualized. Aorta: The aortic root is normal in size and structure. Venous: The inferior vena cava is normal in size with greater than 50% respiratory variability, suggesting right atrial pressure of 3 mmHg. IAS/Shunts: No atrial level shunt detected by color flow Doppler. Additional Comments: Severe global reduction in LV systolic function; restrictive filling; mild LVH; mild LAE.  LEFT VENTRICLE PLAX 2D LVIDd:         4.60 cm  Diastology LVIDs:         3.70 cm  LV e' lateral:   5.87 cm/s LV PW:         1.30 cm  LV E/e' lateral: 16.0 LV IVS:        1.00 cm  LV e' medial:    5.98 cm/s LVOT diam:     2.10 cm  LV E/e' medial:  15.7 LV SV:         39 ml LV SV Index:   18.62 LVOT Area:     3.46 cm  RIGHT VENTRICLE  IVC RV Basal diam:  3.00 cm     IVC diam: 2.10 cm RV Mid diam:    1.60 cm RV S prime:     10.20 cm/s TAPSE (M-mode): 1.7 cm LEFT ATRIUM             Index       RIGHT ATRIUM           Index LA diam:        3.30 cm 1.58 cm/m  RA Area:     17.00 cm LA Vol (A2C):   61.9 ml 29.62 ml/m RA Volume:   46.10 ml  22.08 ml/m LA Vol (A4C):   68.1 ml 32.62 ml/m LA Biplane Vol: 62.9 ml 30.13 ml/m  AORTIC VALVE LVOT Vmax:   71.10 cm/s LVOT Vmean:  38.900 cm/s LVOT VTI:    0.115 m  AORTA Ao Root diam: 3.60 cm Ao Asc diam:  2.40 cm MITRAL VALVE MV Area (PHT): 3.19 cm            SHUNTS MV PHT:        68.88 msec          Systemic VTI:  0.12 m MV Decel Time: 238 msec            Systemic Diam: 2.10 cm MV E  velocity: 93.85 cm/s 103 cm/s  Olga Millers MD Electronically signed by Olga Millers MD Signature Date/Time: 04/29/2019/12:17:09 PM    Final     Assessment and Plan:   1. Chest pain and dyspnea - we will treat medically with anti-anginals and diuretics. He will need to undergo left and right heart cath as I strongly suspect he has lost a stent or two.   2. Mildly elevated troponin in context of h/o CAD - see above  3. 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 I challenged him to stop using illegal drugs.   For questions or updates, please contact [facility] Please consult [email or web address] for contact info under   Leonia Reeves.D.

## 2019-04-29 NOTE — Progress Notes (Signed)
ANTICOAGULATION CONSULT NOTE - Follow Up Consult  Pharmacy Consult for heparin Indication: chest pain/ACS  Labs: Recent Labs    04/28/19 2014 04/28/19 2153 04/29/19 0023 04/29/19 0303  HGB 15.7  --   --  16.2  HCT 47.5  --   --  49.3  PLT 242  --   --  250  LABPROT 13.3  --   --   --   INR 1.0  --   --   --   HEPARINUNFRC  --   --   --  0.29*  CREATININE 1.02  --   --   --   TROPONINIHS 43* 46* 45*  --     Assessment: 59yo male slightly subtherapeutic on heparin with initial dosing for CP though lab was drawn 4h after bolus and true level could be lower; no gtt issues or signs of bleeding per RN.  Goal of Therapy:  Heparin level 0.3-0.7 units/ml   Plan:  Will increase heparin gtt by ~1 unit/kg/hr to 1250 units/hr and check level in 6 hours.    Vernard Gambles, PharmD, BCPS  04/29/2019,3:57 AM

## 2019-04-29 NOTE — Progress Notes (Signed)
ANTICOAGULATION CONSULT NOTE - Initial Consult  Pharmacy Consult for Heparin Indication: chest pain/ACS  Allergies  Allergen Reactions  . Ivp Dye [Iodinated Diagnostic Agents] Other (See Comments)    seizures  . Tramadol Other (See Comments)    seizures    Patient Measurements: Height: 6' (182.9 cm) Weight: 190 lb 11.2 oz (86.5 kg) IBW/kg (Calculated) : 77.6 Heparin Dosing Weight: 86.5 kg  Vital Signs: Temp: 98.1 F (36.7 C) (01/10 0209) Temp Source: Oral (01/10 0209) BP: 170/124 (01/10 0209) Pulse Rate: 111 (01/10 0209)  Labs: Recent Labs    04/28/19 2014 04/28/19 2153 04/29/19 0023 04/29/19 0303  HGB 15.7  --   --  16.2  HCT 47.5  --   --  49.3  PLT 242  --   --  250  LABPROT 13.3  --   --   --   INR 1.0  --   --   --   HEPARINUNFRC  --   --   --  0.29*  CREATININE 1.02  --   --   --   TROPONINIHS 43* 46* 45* 37*    Estimated Creatinine Clearance: 86.6 mL/min (by C-G formula based on SCr of 1.02 mg/dL).   Medical History: Past Medical History:  Diagnosis Date  . COPD (chronic obstructive pulmonary disease) (HCC)   . Coronary artery disease   . Hepatitis-C   . Hypertension   . MI (myocardial infarction) [facility])     Assessment: 23 yom admitted with chest pain. Pharmacy to dose heparin. No anticoagulation PTA.   Heparin level is therapeutic at 0.34 after a rate increase to 1250 units/hour. H&H is stable at 16.2/49.3, plts wnl at 250. Negative d-dimer with positive troponins.    Goal of Therapy:  Heparin level 0.3-0.7 units/ml Monitor platelets by anticoagulation protocol: Yes   Plan:  Continue heparin infusion at 1250 units/hr Will check a confirmatory heparin level at 1600 Daily HL and CBC Monitor for signs of bleeding Follow up cardiology plans    Thank you,   Fara Olden, PharmD PGY-1 Pharmacy Resident   Please check amion for clinical pharmacist contact number   04/29/2019,9:40 AM

## 2019-04-29 NOTE — Progress Notes (Signed)
Preliminary consult note prepped; Dr. Ladona Ridgel has already been in to see patient. He will be taking over note and amending to reflect his interview/exam. Ronie Spies PA-C

## 2019-04-29 NOTE — Progress Notes (Signed)
PROGRESS NOTE    Francisco [name]  QIO:[id number] DOB: Jan 25, 1961 DOA: 04/28/2019 PCP: Patient, No Pcp Per   Brief Narrative:  HPI on 04/28/19 by Dr. Clydia Llano Francisco [name] is a 59 y.o. male with medical history significant of COPD, CAD s/p PCI (5 yrs prior, in [place]), medication non-compliance, Hep C, IVDU (previously heroin, now meth), Tobacco use who presents with ongoing SOB/Chest pressure  Patient was seen in ER on 12/28, but reports onset of his symptoms which he currently presents with again has largely unimproved since 12/25.  He states he has been wheezing and with SOB since 12/25 but he feels his bigger concern is that he cannot walk more than 5 feet before being completely spent.  He reports feeling as though someone is sitting on his chest as well.  He states he has a dry cough but no productive sputum.  He does not report and fevers or chills.  He does get diaphoretic with episodes.  No nausea/vomiting or radiation of pain reported.  He smokes currently down to 1/4 ppd and was at 2 ppd as recently as 3 months prior.  He does use meth, IV.  He denies alcohol use.  He does report increased meth use around Christmas and time of his worsening symptoms.  He reports due to how unable he is to get around, friends at the [facility] house have been delivering him meals.  He denies any orthopnea or PND.  Interim history Noted for chest pain.  Cardiology consulted and planning for right and left heart cath on 04/30/2019. Assessment & Plan   Chest pain -Patient with history of CAD and PCI placement approximately 5 years ago in [facility] -States pain is reminiscent to previous episodes 5 years ago -Has not had pain since last night -Patient has not been taking any of his home medications, and does not have a primary doctor or cardiologist since moving to [place] -Troponins cycled and flat -Currently on heparin drip -Lipid panel obtained, cholesterol 215, LDL  111, HDL 93, triglycerides 57 -Hemoglobin A1c 5.5 -Cardiology consulted and appreciated, discussed with Dr. Ladona Ridgel, plan for right and left heart cath on 04/30/2019 -Continue statin, Coreg, aspirin, lisinopril  New cardiomyopathy/combined systolic/diastolic CHF -Patient appears to be euvolemic -Echocardiogram shows EF of 25 to 30%, grade 3 diastolic dysfunction.  LV has severely decreased function.  Mildly increased LVH.  LV demonstrates global hypokinesis. -BNP 682 -As above, planning for heart catheterization  Essential hypertension -Uncontrolled however is not been taking any home medications -Started on lisinopril and Coreg -Will add on IV hydralazine  Methamphetamine abuse -UDS positive for amphetamines -He admits to using meth, with last usage approximately 2 days ago.  States he is a daily user -Counseled -Patient would like to quit -TOC consulted  Tobacco abuse -Patient states he is down to smoking 3 cigarettes/day -Trying to quit -Counseled  Hepatitis C -Not treated -Need to follow-up with PCP or infectious disease or GI as an outpatient   DVT Prophylaxis  heparin  Code Status: Full  Family Communication: None at bedside  Disposition Plan: Admitted. Pending RHC/LHC on 04/30/19. Dispo likely home when stable.   Consultants Cardiology  Procedures  Echocardiogram  Antibiotics   Anti-infectives (From admission, onward)   Start     Dose/Rate Route Frequency Ordered Stop   04/30/19 0000  azithromycin (ZITHROMAX) 500 mg in sodium chloride 0.9 % 250 mL IVPB  Status:  Discontinued     500 mg 250 mL/hr over 60 Minutes  Intravenous Every 24 hours 04/29/19 0022 04/29/19 0211   04/29/19 0030  azithromycin (ZITHROMAX) 500 mg in sodium chloride 0.9 % 250 mL IVPB     500 mg 250 mL/hr over 60 Minutes Intravenous  Once 04/29/19 0028 04/29/19 0231      Subjective:   Francisco [name] seen and examined today.  Patient states he is no longer having chest pain. Denies further  shortness of breath. Denies abdominal pain, N/V/D/C. States chest pain was similar to chest pain he experienced 5 years ago.  Objective:   Vitals:   04/29/19 0145 04/29/19 0207 04/29/19 0209 04/29/19 1011  BP: (!) 137/106  (!) 170/124 (!) 156/112  Pulse: (!) 108  (!) 111 (!) 106  Resp: (!) 22  (!) 22   Temp:   98.1 F (36.7 C)   TempSrc:   Oral   SpO2: 98%  98%   Weight:  86.5 kg    Height:  6' (1.829 m)      Intake/Output Summary (Last 24 hours) at 04/29/2019 1333 Last data filed at 04/29/2019 0277 Gross per 24 hour  Intake 250.31 ml  Output --  Net 250.31 ml   Filed Weights   04/29/19 0207  Weight: 86.5 kg    Exam  General: Well developed, well nourished, NAD, appears stated age  HEENT: NCAT, mucous membranes moist.   Cardiovascular: S1 S2 auscultated, RRR, no murmur  Respiratory: Clear to auscultation bilaterally  Abdomen: Soft, nontender, nondistended, + bowel sounds  Extremities: warm dry without cyanosis clubbing or edema  Neuro: AAOx3, nonfocal  Psych: Appropriate mood and affect   Data Reviewed: I have personally reviewed following labs and imaging studies  CBC: Recent Labs  Lab 04/28/19 2014 04/29/19 0303  WBC 16.3* 14.3*  HGB 15.7 16.2  HCT 47.5 49.3  MCV 98.3 98.6  PLT 242 250   Basic Metabolic Panel: Recent Labs  Lab 04/28/19 2014  NA 137  K 4.3  CL 101  CO2 25  GLUCOSE 109*  BUN 9  CREATININE 1.02  CALCIUM 9.0   GFR: Estimated Creatinine Clearance: 86.6 mL/min (by C-G formula based on SCr of 1.02 mg/dL). Liver Function Tests: No results for input(s): AST, ALT, ALKPHOS, BILITOT, PROT, ALBUMIN in the last 168 hours. No results for input(s): LIPASE, AMYLASE in the last 168 hours. No results for input(s): AMMONIA in the last 168 hours. Coagulation Profile: Recent Labs  Lab 04/28/19 2014  INR 1.0   Cardiac Enzymes: No results for input(s): CKTOTAL, CKMB, CKMBINDEX, TROPONINI in the last 168 hours. BNP (last 3 results) No  results for input(s): PROBNP in the last 8760 hours. HbA1C: Recent Labs    04/29/19 0303  HGBA1C 5.5   CBG: No results for input(s): GLUCAP in the last 168 hours. Lipid Profile: Recent Labs    04/29/19 0303  CHOL 215*  HDL 93  LDLCALC 111*  TRIG 57  CHOLHDL 2.3   Thyroid Function Tests: No results for input(s): TSH, T4TOTAL, FREET4, T3FREE, THYROIDAB in the last 72 hours. Anemia Panel: No results for input(s): VITAMINB12, FOLATE, FERRITIN, TIBC, IRON, RETICCTPCT in the last 72 hours. Urine analysis:    Component Value Date/Time   COLORURINE YELLOW 06/16/2018 2222   APPEARANCEUR HAZY (A) 06/16/2018 2222   LABSPEC 1.017 06/16/2018 2222   PHURINE 7.0 06/16/2018 2222   GLUCOSEU NEGATIVE 06/16/2018 2222   HGBUR NEGATIVE 06/16/2018 2222   BILIRUBINUR NEGATIVE 06/16/2018 2222   KETONESUR NEGATIVE 06/16/2018 2222   PROTEINUR NEGATIVE 06/16/2018 2222   NITRITE  NEGATIVE 06/16/2018 2222   LEUKOCYTESUR NEGATIVE 06/16/2018 2222   Sepsis Labs: @LABRCNTIP (procalcitonin:4,lacticidven:4)  ) Recent Results (from the past 240 hour(s))  Respiratory Panel by RT PCR (Flu A&B, Covid) - Nasopharyngeal Swab     Status: None   Collection Time: 04/28/19  9:59 PM   Specimen: Nasopharyngeal Swab  Result Value Ref Range Status   SARS Coronavirus 2 by RT PCR NEGATIVE NEGATIVE Final    Comment: (NOTE) SARS-CoV-2 target nucleic acids are NOT DETECTED. The SARS-CoV-2 RNA is generally detectable in upper respiratoy specimens during the acute phase of infection. The lowest concentration of SARS-CoV-2 viral copies this assay can detect is 131 copies/mL. A negative result does not preclude SARS-Cov-2 infection and should not be used as the sole basis for treatment or other patient management decisions. A negative result may occur with  improper specimen collection/handling, submission of specimen other than nasopharyngeal swab, presence of viral mutation(s) within the areas targeted by this  assay, and inadequate number of viral copies (<131 copies/mL). A negative result must be combined with clinical observations, patient history, and epidemiological information. The expected result is Negative. Fact Sheet for Patients:  06/26/19 Fact Sheet for Healthcare Providers:  https://www.moore.com/ This test is not yet ap proved or cleared by the https://www.young.biz/ FDA and  has been authorized for detection and/or diagnosis of SARS-CoV-2 by FDA under an Emergency Use Authorization (EUA). This EUA will remain  in effect (meaning this test can be used) for the duration of the COVID-19 declaration under Section 564(b)(1) of the Act, 21 U.S.C. section 360bbb-3(b)(1), unless the authorization is terminated or revoked sooner.    Influenza A by PCR NEGATIVE NEGATIVE Final   Influenza B by PCR NEGATIVE NEGATIVE Final    Comment: (NOTE) The Xpert Xpress SARS-CoV-2/FLU/RSV assay is intended as an aid in  the diagnosis of influenza from Nasopharyngeal swab specimens and  should not be used as a sole basis for treatment. Nasal washings and  aspirates are unacceptable for Xpert Xpress SARS-CoV-2/FLU/RSV  testing. Fact Sheet for Patients: [place] Fact Sheet for Healthcare Providers: https://www.moore.com/ This test is not yet approved or cleared by the https://www.young.biz/ FDA and  has been authorized for detection and/or diagnosis of SARS-CoV-2 by  FDA under an Emergency Use Authorization (EUA). This EUA will remain  in effect (meaning this test can be used) for the duration of the  Covid-19 declaration under Section 564(b)(1) of the Act, 21  U.S.C. section 360bbb-3(b)(1), unless the authorization is  terminated or revoked. Performed at [facility], [address], [place], [place]       Radiology Studies: DG Chest 2 View  Result Date: 04/28/2019 CLINICAL DATA:  Chest pain,  shortness of breath EXAM: CHEST - 2 VIEW COMPARISON:  04/25/2019 FINDINGS: Heart and mediastinal contours are within normal limits. No focal opacities or effusions. No acute bony abnormality. IMPRESSION: No active cardiopulmonary disease. Electronically Signed   By: 06/23/2019 M.D.   On: 04/28/2019 20:24   ECHOCARDIOGRAM COMPLETE  Result Date: 04/29/2019   ECHOCARDIOGRAM REPORT   Patient Name:   KEATEN MASHEK Date of Exam: 04/29/2019 Medical Rec #:  06/27/2019   Height:       72.0 in Accession #:    [id number]  Weight:       190.7 lb Date of Birth:  12/29/1960   BSA:          2.09 m Patient Age:    58 years    BP:  156/112 mmHg Patient Gender: M           HR:           108 bpm. Exam Location:  Inpatient Procedure: 2D Echo, Cardiac Doppler and Color Doppler Indications:    CAD of Native Vessel 414.01/I25.10  History:        Patient has no prior history of Echocardiogram examinations.                 Previous Myocardial Infarction, COPD; Risk Factors:Hypertension.  Sonographer:    Elmarie Shiley Dance Referring Phys: [id number] ARAVIND CHANDRA IMPRESSIONS  1. Left ventricular ejection fraction, by visual estimation, is 25 to 30%. The left ventricle has severely decreased function. There is mildly increased left ventricular hypertrophy.  2. Left ventricular diastolic parameters are consistent with Grade III diastolic dysfunction (restrictive).  3. The left ventricle demonstrates global hypokinesis.  4. Global right ventricle has normal systolic function.The right ventricular size is normal.  5. Left atrial size was mildly dilated.  6. Right atrial size was normal.  7. The mitral valve is normal in structure. Trivial mitral valve regurgitation. No evidence of mitral stenosis.  8. The tricuspid valve is normal in structure.  9. The aortic valve is tricuspid. Aortic valve regurgitation is not visualized. Mild aortic valve sclerosis without stenosis. 10. The pulmonic valve was normal in structure. Pulmonic valve  regurgitation is not visualized. 11. The inferior vena cava is normal in size with greater than 50% respiratory variability, suggesting right atrial pressure of 3 mmHg. 12. Severe global reduction in LV systolic function; restrictive filling; mild LVH; mild LAE. FINDINGS  Left Ventricle: Left ventricular ejection fraction, by visual estimation, is 25 to 30%. The left ventricle has severely decreased function. The left ventricle demonstrates global hypokinesis. There is mildly increased left ventricular hypertrophy. Left ventricular diastolic parameters are consistent with Grade III diastolic dysfunction (restrictive). Normal left atrial pressure. Right Ventricle: The right ventricular size is normal.Global RV systolic function is has normal systolic function. Left Atrium: Left atrial size was mildly dilated. Right Atrium: Right atrial size was normal in size Pericardium: There is no evidence of pericardial effusion. Mitral Valve: The mitral valve is normal in structure. Trivial mitral valve regurgitation. No evidence of mitral valve stenosis by observation. Tricuspid Valve: The tricuspid valve is normal in structure. Tricuspid valve regurgitation is trivial. Aortic Valve: The aortic valve is tricuspid. Aortic valve regurgitation is not visualized. Mild aortic valve sclerosis is present, with no evidence of aortic valve stenosis. Pulmonic Valve: The pulmonic valve was normal in structure. Pulmonic valve regurgitation is not visualized. Pulmonic regurgitation is not visualized. Aorta: The aortic root is normal in size and structure. Venous: The inferior vena cava is normal in size with greater than 50% respiratory variability, suggesting right atrial pressure of 3 mmHg. IAS/Shunts: No atrial level shunt detected by color flow Doppler. Additional Comments: Severe global reduction in LV systolic function; restrictive filling; mild LVH; mild LAE.  LEFT VENTRICLE PLAX 2D LVIDd:         4.60 cm  Diastology LVIDs:          3.70 cm  LV e' lateral:   5.87 cm/s LV PW:         1.30 cm  LV E/e' lateral: 16.0 LV IVS:        1.00 cm  LV e' medial:    5.98 cm/s LVOT diam:     2.10 cm  LV E/e' medial:  15.7 LV SV:  39 ml LV SV Index:   18.62 LVOT Area:     3.46 cm  RIGHT VENTRICLE             IVC RV Basal diam:  3.00 cm     IVC diam: 2.10 cm RV Mid diam:    1.60 cm RV S prime:     10.20 cm/s TAPSE (M-mode): 1.7 cm LEFT ATRIUM             Index       RIGHT ATRIUM           Index LA diam:        3.30 cm 1.58 cm/m  RA Area:     17.00 cm LA Vol (A2C):   61.9 ml 29.62 ml/m RA Volume:   46.10 ml  22.08 ml/m LA Vol (A4C):   68.1 ml 32.62 ml/m LA Biplane Vol: 62.9 ml 30.13 ml/m  AORTIC VALVE LVOT Vmax:   71.10 cm/s LVOT Vmean:  38.900 cm/s LVOT VTI:    0.115 m  AORTA Ao Root diam: 3.60 cm Ao Asc diam:  2.40 cm MITRAL VALVE MV Area (PHT): 3.19 cm            SHUNTS MV PHT:        68.88 msec          Systemic VTI:  0.12 m MV Decel Time: 238 msec            Systemic Diam: 2.10 cm MV E velocity: 93.85 cm/s 103 cm/s  Kirk Ruths MD Electronically signed by Kirk Ruths MD Signature Date/Time: 04/29/2019/12:17:09 PM    Final      Scheduled Meds: . aspirin EC  81 mg Oral Daily  . atorvastatin  80 mg Oral q1800  . carvedilol  6.25 mg Oral BID WC  . lisinopril  5 mg Oral Daily  . nitroGLYCERIN  0.5 inch Topical Q6H  . sodium chloride flush  3 mL Intravenous Q12H   Continuous Infusions: . heparin 1,250 Units/hr (04/29/19 1304)     LOS: 0 days   Time Spent in minutes   45 minutes  Osmar Howton D.O. on 04/29/2019 at 1:33 PM  Between 7am to 7pm - Please see pager noted on [email or web address]  After 7pm go to [email or web address]  And look for the night coverage person covering for me after hours  [facility] Office  [phone]

## 2019-04-29 NOTE — Progress Notes (Signed)
  Echocardiogram 2D Echocardiogram has been performed.  Francisco [name] 04/29/2019, 11:15 AM

## 2019-04-30 ENCOUNTER — Encounter (HOSPITAL_COMMUNITY)
Admission: EM | Disposition: A | Discharge status: Home / Self Care | Payer: Self-pay | Source: Home / Self Care | Attending: Internal Medicine

## 2019-04-30 DIAGNOSIS — I5041 Acute combined systolic (congestive) and diastolic (congestive) heart failure: Secondary | ICD-10-CM

## 2019-04-30 DIAGNOSIS — J449 Chronic obstructive pulmonary disease, unspecified: Secondary | ICD-10-CM

## 2019-04-30 DIAGNOSIS — R778 Other specified abnormalities of plasma proteins: Secondary | ICD-10-CM

## 2019-04-30 DIAGNOSIS — I10 Essential (primary) hypertension: Secondary | ICD-10-CM

## 2019-04-30 DIAGNOSIS — J441 Chronic obstructive pulmonary disease with (acute) exacerbation: Secondary | ICD-10-CM

## 2019-04-30 DIAGNOSIS — I2510 Atherosclerotic heart disease of native coronary artery without angina pectoris: Secondary | ICD-10-CM

## 2019-04-30 DIAGNOSIS — I200 Unstable angina: Secondary | ICD-10-CM

## 2019-04-30 DIAGNOSIS — I420 Dilated cardiomyopathy: Secondary | ICD-10-CM

## 2019-04-30 HISTORY — PX: RIGHT/LEFT HEART CATH AND CORONARY ANGIOGRAPHY: CATH118266

## 2019-04-30 LAB — POCT I-STAT EG7
Bicarbonate: 28.4 mmol/L — ABNORMAL HIGH (ref 20.0–28.0)
Bicarbonate: 28.8 mmol/L — ABNORMAL HIGH (ref 20.0–28.0)
Bicarbonate: 29 mmol/L — ABNORMAL HIGH (ref 20.0–28.0)
Calcium, Ion: 1.23 mmol/L (ref 1.15–1.40)
Calcium, Ion: 1.26 mmol/L (ref 1.15–1.40)
Calcium, Ion: 1.27 mmol/L (ref 1.15–1.40)
HCT: 44 % (ref 39.0–52.0)
HCT: 45 % (ref 39.0–52.0)
HCT: 45 % (ref 39.0–52.0)
Hemoglobin: 15 g/dL (ref 13.0–17.0)
Hemoglobin: 15.3 g/dL (ref 13.0–17.0)
Hemoglobin: 15.3 g/dL (ref 13.0–17.0)
O2 Saturation: 55 %
O2 Saturation: 58 %
O2 Saturation: 63 %
Potassium: 4.9 mmol/L (ref 3.5–5.1)
Potassium: 5.1 mmol/L (ref 3.5–5.1)
Potassium: 5.3 mmol/L — ABNORMAL HIGH (ref 3.5–5.1)
Sodium: 139 mmol/L (ref 135–145)
Sodium: 141 mmol/L (ref 135–145)
Sodium: 141 mmol/L (ref 135–145)
TCO2: 30 mmol/L (ref 22–32)
TCO2: 31 mmol/L (ref 22–32)
TCO2: 31 mmol/L (ref 22–32)
pCO2, Ven: 61.3 mmHg — ABNORMAL HIGH (ref 44.0–60.0)
pCO2, Ven: 62.4 mmHg — ABNORMAL HIGH (ref 44.0–60.0)
pCO2, Ven: 64 mmHg — ABNORMAL HIGH (ref 44.0–60.0)
pH, Ven: 7.264 (ref 7.250–7.430)
pH, Ven: 7.272 (ref 7.250–7.430)
pH, Ven: 7.274 (ref 7.250–7.430)
pO2, Ven: 34 mmHg (ref 32.0–45.0)
pO2, Ven: 35 mmHg (ref 32.0–45.0)
pO2, Ven: 38 mmHg (ref 32.0–45.0)

## 2019-04-30 LAB — POCT I-STAT 7, (LYTES, BLD GAS, ICA,H+H)
Bicarbonate: 27.4 mmol/L (ref 20.0–28.0)
Calcium, Ion: 1.27 mmol/L (ref 1.15–1.40)
HCT: 45 % (ref 39.0–52.0)
Hemoglobin: 15.3 g/dL (ref 13.0–17.0)
O2 Saturation: 99 %
Potassium: 4.8 mmol/L (ref 3.5–5.1)
Sodium: 140 mmol/L (ref 135–145)
TCO2: 29 mmol/L (ref 22–32)
pCO2 arterial: 54.4 mmHg — ABNORMAL HIGH (ref 32.0–48.0)
pH, Arterial: 7.31 — ABNORMAL LOW (ref 7.350–7.450)
pO2, Arterial: 157 mmHg — ABNORMAL HIGH (ref 83.0–108.0)

## 2019-04-30 LAB — CBC
HCT: 45.8 % (ref 39.0–52.0)
Hemoglobin: 15.1 g/dL (ref 13.0–17.0)
MCH: 33 pg (ref 26.0–34.0)
MCHC: 33 g/dL (ref 30.0–36.0)
MCV: 100 fL (ref 80.0–100.0)
Platelets: 243 10*3/uL (ref 150–400)
RBC: 4.58 MIL/uL (ref 4.22–5.81)
RDW: 14.4 % (ref 11.5–15.5)
WBC: 18.2 10*3/uL — ABNORMAL HIGH (ref 4.0–10.5)
nRBC: 0 % (ref 0.0–0.2)

## 2019-04-30 LAB — BASIC METABOLIC PANEL
Anion gap: 8 (ref 5–15)
BUN: 26 mg/dL — ABNORMAL HIGH (ref 6–20)
CO2: 29 mmol/L (ref 22–32)
Calcium: 9.2 mg/dL (ref 8.9–10.3)
Chloride: 104 mmol/L (ref 98–111)
Creatinine, Ser: 1.14 mg/dL (ref 0.61–1.24)
GFR calc Af Amer: >60 mL/min (ref >60)
GFR calc non Af Amer: >60 mL/min (ref >60)
Glucose, Bld: 119 mg/dL — ABNORMAL HIGH (ref 70–99)
Potassium: 4.9 mmol/L (ref 3.5–5.1)
Sodium: 141 mmol/L (ref 135–145)

## 2019-04-30 LAB — HEPARIN LEVEL (UNFRACTIONATED): Heparin Unfractionated: 0.37 IU/mL (ref 0.30–0.70)

## 2019-04-30 SURGERY — RIGHT/LEFT HEART CATH AND CORONARY ANGIOGRAPHY
Anesthesia: LOCAL

## 2019-04-30 MED ORDER — LIDOCAINE HCL (PF) 1 % IJ SOLN
INTRAMUSCULAR | Status: AC
  Filled 2019-04-30: qty 30

## 2019-04-30 MED ORDER — NITROGLYCERIN 1 MG/10 ML FOR IR/CATH LAB
INTRA_ARTERIAL | Status: DC | PRN
  Administered 2019-04-30: 200 ug via INTRACORONARY

## 2019-04-30 MED ORDER — IOHEXOL 350 MG/ML SOLN
INTRAVENOUS | Status: DC | PRN
  Administered 2019-04-30: 70 mL

## 2019-04-30 MED ORDER — CARVEDILOL 12.5 MG PO TABS
12.5000 mg | ORAL_TABLET | Freq: Two times a day (BID) | ORAL | Status: DC
  Administered 2019-04-30 – 2019-05-02 (×4): 12.5 mg via ORAL
  Filled 2019-04-30 (×3): qty 1

## 2019-04-30 MED ORDER — MORPHINE SULFATE (PF) 2 MG/ML IV SOLN
2.0000 mg | INTRAVENOUS | Status: DC | PRN
  Administered 2019-04-30: 2 mg via INTRAVENOUS
  Filled 2019-04-30: qty 1

## 2019-04-30 MED ORDER — SODIUM CHLORIDE 0.9% FLUSH: 3.0000 mL | INTRAVENOUS | Status: DC | PRN

## 2019-04-30 MED ORDER — FUROSEMIDE 10 MG/ML IJ SOLN
40.0000 mg | Freq: Two times a day (BID) | INTRAMUSCULAR | Status: DC
  Administered 2019-05-01 – 2019-05-02 (×3): 40 mg via INTRAVENOUS
  Filled 2019-04-30 (×3): qty 4

## 2019-04-30 MED ORDER — LISINOPRIL 10 MG PO TABS: 10.0000 mg | ORAL_TABLET | Freq: Every day | ORAL | Status: DC

## 2019-04-30 MED ORDER — VERAPAMIL HCL 2.5 MG/ML IV SOLN
INTRAVENOUS | Status: DC | PRN
  Administered 2019-04-30: 10 mL via INTRA_ARTERIAL

## 2019-04-30 MED ORDER — LABETALOL HCL 5 MG/ML IV SOLN: 10.0000 mg | INTRAVENOUS | Status: AC | PRN

## 2019-04-30 MED ORDER — HEPARIN (PORCINE) IN NACL 1000-0.9 UT/500ML-% IV SOLN
INTRAVENOUS | Status: DC | PRN
  Administered 2019-04-30: 500 mL

## 2019-04-30 MED ORDER — SODIUM CHLORIDE 0.9 % IV SOLN: 250.0000 mL | INTRAVENOUS | Status: DC | PRN

## 2019-04-30 MED ORDER — HYDRALAZINE HCL 20 MG/ML IJ SOLN: 10.0000 mg | INTRAMUSCULAR | Status: AC | PRN

## 2019-04-30 MED ORDER — HEPARIN SODIUM (PORCINE) 1000 UNIT/ML IJ SOLN
INTRAMUSCULAR | Status: DC | PRN
  Administered 2019-04-30: 5000 [IU] via INTRAVENOUS

## 2019-04-30 MED ORDER — HEPARIN SODIUM (PORCINE) 5000 UNIT/ML IJ SOLN
5000.0000 [IU] | Freq: Three times a day (TID) | INTRAMUSCULAR | Status: DC
  Administered 2019-04-30 – 2019-05-01 (×3): 5000 [IU] via SUBCUTANEOUS
  Filled 2019-04-30 (×3): qty 1

## 2019-04-30 MED ORDER — FUROSEMIDE 10 MG/ML IJ SOLN: 40.0000 mg | Freq: Two times a day (BID) | INTRAMUSCULAR | Status: DC

## 2019-04-30 MED ORDER — SODIUM CHLORIDE 0.9 % IV SOLN: INTRAVENOUS | Status: AC

## 2019-04-30 MED ORDER — MIDAZOLAM HCL 2 MG/2ML IJ SOLN
INTRAMUSCULAR | Status: DC | PRN
  Administered 2019-04-30: 2 mg via INTRAVENOUS

## 2019-04-30 MED ORDER — HEPARIN (PORCINE) IN NACL 1000-0.9 UT/500ML-% IV SOLN
INTRAVENOUS | Status: AC
  Filled 2019-04-30: qty 1000

## 2019-04-30 MED ORDER — FENTANYL CITRATE (PF) 100 MCG/2ML IJ SOLN
INTRAMUSCULAR | Status: DC | PRN
  Administered 2019-04-30: 50 ug via INTRAVENOUS

## 2019-04-30 MED ORDER — SODIUM CHLORIDE 0.9% FLUSH: 3.0000 mL | Freq: Two times a day (BID) | INTRAVENOUS | Status: DC

## 2019-04-30 MED ORDER — FUROSEMIDE 10 MG/ML IJ SOLN
80.0000 mg | Freq: Once | INTRAMUSCULAR | Status: AC
  Administered 2019-04-30: 80 mg via INTRAVENOUS
  Filled 2019-04-30: qty 8

## 2019-04-30 MED ORDER — DIPHENHYDRAMINE HCL 50 MG/ML IJ SOLN
25.0000 mg | Freq: Once | INTRAMUSCULAR | Status: AC
  Administered 2019-04-30: 25 mg via INTRAVENOUS
  Filled 2019-04-30: qty 1

## 2019-04-30 MED ORDER — MIDAZOLAM HCL 2 MG/2ML IJ SOLN
INTRAMUSCULAR | Status: AC
  Filled 2019-04-30: qty 2

## 2019-04-30 MED ORDER — LIDOCAINE HCL (PF) 1 % IJ SOLN
INTRAMUSCULAR | Status: DC | PRN
  Administered 2019-04-30 (×2): 2 mL

## 2019-04-30 MED ORDER — FUROSEMIDE 10 MG/ML IJ SOLN: 80.0000 mg | Freq: Once | INTRAMUSCULAR | Status: DC

## 2019-04-30 MED ORDER — NITROGLYCERIN 1 MG/10 ML FOR IR/CATH LAB
INTRA_ARTERIAL | Status: AC
  Filled 2019-04-30: qty 10

## 2019-04-30 MED ORDER — FENTANYL CITRATE (PF) 100 MCG/2ML IJ SOLN
INTRAMUSCULAR | Status: AC
  Filled 2019-04-30: qty 2

## 2019-04-30 MED ORDER — METHYLPREDNISOLONE SODIUM SUCC 125 MG IJ SOLR
125.0000 mg | Freq: Once | INTRAMUSCULAR | Status: AC
  Administered 2019-04-30: 125 mg via INTRAVENOUS
  Filled 2019-04-30: qty 2

## 2019-04-30 MED ORDER — HEPARIN SODIUM (PORCINE) 1000 UNIT/ML IJ SOLN
INTRAMUSCULAR | Status: AC
  Filled 2019-04-30: qty 1

## 2019-04-30 SURGICAL SUPPLY — 14 items
CATH BALLN WEDGE 5F 110CM (CATHETERS) ×1 IMPLANT
CATH OPTITORQUE TIG 4.0 5F (CATHETERS) ×1 IMPLANT
DEVICE RAD COMP TR BAND LRG (VASCULAR PRODUCTS) ×1 IMPLANT
GLIDESHEATH SLEND SS 6F .021 (SHEATH) ×1 IMPLANT
GUIDEWIRE .025 260CM (WIRE) ×1 IMPLANT
GUIDEWIRE INQWIRE 1.5J.035X260 (WIRE) IMPLANT
INQWIRE 1.5J .035X260CM (WIRE) ×2
KIT HEART LEFT (KITS) ×2 IMPLANT
PACK CARDIAC CATHETERIZATION (CUSTOM PROCEDURE TRAY) ×2 IMPLANT
SHEATH GLIDE SLENDER 4/5FR (SHEATH) ×1 IMPLANT
SHEATH PROBE COVER 6X72 (BAG) IMPLANT
TRANSDUCER W/STOPCOCK (MISCELLANEOUS) ×2 IMPLANT
TUBING CIL FLEX 10 FLL-RA (TUBING) ×2 IMPLANT
WIRE HI TORQ VERSACORE-J 145CM (WIRE) ×1 IMPLANT

## 2019-04-30 NOTE — Progress Notes (Signed)
ANTICOAGULATION CONSULT NOTE   Pharmacy Consult for Heparin Indication: chest pain/ACS  Allergies  Allergen Reactions  . Ivp Dye [Iodinated Diagnostic Agents] Other (See Comments)    seizures  . Tramadol Other (See Comments)    seizures    Patient Measurements: Height: 6' (182.9 cm) Weight: 190 lb 11.2 oz (86.5 kg) IBW/kg (Calculated) : 77.6 Heparin Dosing Weight: 86.5 kg  Vital Signs: Temp: 97.5 F (36.4 C) (01/10 2130) Temp Source: Oral (01/10 2130) BP: 150/109 (01/11 0414) Pulse Rate: 106 (01/10 2130)  Labs: Recent Labs    04/28/19 2014 04/28/19 2014 04/28/19 2153 04/29/19 0023 04/29/19 0303 04/29/19 0948 04/29/19 1529 04/30/19 0300  HGB 15.7  --   --   --  16.2  --   --  15.1  HCT 47.5  --   --   --  49.3  --   --  45.8  PLT 242  --   --   --  250  --   --  243  LABPROT 13.3  --   --   --   --   --   --   --   INR 1.0  --   --   --   --   --   --   --   HEPARINUNFRC  --    < >  --   --  0.29* 0.34 0.27* 0.37  CREATININE 1.02  --   --   --   --   --   --  1.14  TROPONINIHS 43*  --  46* 45* 37*  --   --   --    < > = values in this interval not displayed.    Estimated Creatinine Clearance: 77.5 mL/min (by C-G formula based on SCr of 1.14 mg/dL).   Medical History: Past Medical History:  Diagnosis Date  . COPD (chronic obstructive pulmonary disease) (HCC)   . Coronary artery disease    a. s/p prior PCI.  Marland Kitchen Hepatitis-C   . Hypertension   . IV drug abuse (HCC)    a. 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  . Medically noncompliant   . MI (myocardial infarction) (HCC)   . Tobacco abuse     Assessment: 46 yom admitted with chest pain. Pharmacy to dose heparin. No anticoagulation PTA.   Heparin level is therapeutic at 0.37, on 1400 units/hr. H&H is stable at 15.1/45.8, plts WNL at 243. Negative d-dimer with positive troponins.   Goal of Therapy:  Heparin level 0.3-0.7 units/ml Monitor platelets by anticoagulation protocol: Yes   Plan:   Continue IV heparin at 1400 units/hr. Daily heparin level and CBC. F/u plan for cardiac cath vs duration of heparin infusion  Sherron Monday, PharmD, BCCCP Clinical Pharmacist  Phone: [phone]  Please check AMION for all [facility] phone numbers After 10:00 PM, call Main Pharmacy [phone]  04/30/2019 7:14 AM

## 2019-04-30 NOTE — Progress Notes (Signed)
PROGRESS NOTE    Francisco [name]  HYI:[id number] DOB: 16-Feb-1961 DOA: 04/28/2019 PCP: Patient, No Pcp Per   Brief Narrative:  HPI on 04/28/19 by Dr. Clydia Llano Francisco [name] is a 59 y.o. male with medical history significant of COPD, CAD s/p PCI (5 yrs prior, in [place]), medication non-compliance, Hep C, IVDU (previously heroin, now meth), Tobacco use who presents with ongoing SOB/Chest pressure  Patient was seen in ER on 12/28, but reports onset of his symptoms which he currently presents with again has largely unimproved since 12/25.  He states he has been wheezing and with SOB since 12/25 but he feels his bigger concern is that he cannot walk more than 5 feet before being completely spent.  He reports feeling as though someone is sitting on his chest as well.  He states he has a dry cough but no productive sputum.  He does not report and fevers or chills.  He does get diaphoretic with episodes.  No nausea/vomiting or radiation of pain reported.  He smokes currently down to 1/4 ppd and was at 2 ppd as recently as 3 months prior.  He does use meth, IV.  He denies alcohol use.  He does report increased meth use around Christmas and time of his worsening symptoms.  He reports due to how unable he is to get around, friends at the [facility] house have been delivering him meals.  He denies any orthopnea or PND.  Interim history Admitted for chest pain.  Echo showed EF 25-30%. Cardiology consulted, s/p cath.  Assessment & Plan   Chest pain -Patient with history of CAD and PCI placement approximately 5 years ago in [facility] -States pain is reminiscent to previous episodes 5 years ago -Has not had pain since last night -Patient has not been taking any of his home medications, and does not have a primary doctor or cardiologist since moving to [place] -Troponins cycled and flat -Currently on heparin drip -Lipid panel obtained, cholesterol 215, LDL 111, HDL 93,  triglycerides 57 -Hemoglobin A1c 5.5 -Cardiology consulted and appreciated, s/p right/left cardiac catheterization showing proximal RCA to mid RCA lesion 50% stenosed, in-stent restenosis of distal 1/2 stented segment.  Hemodynamic findings assistant with severe pulmonary hypertension.  Recommended aggressive diuresis. -Continue statin, Coreg, aspirin, lisinopril  New cardiomyopathy/combined systolic/diastolic CHF -Patient appears to be euvolemic -Echocardiogram shows EF of 25 to 30%, grade 3 diastolic dysfunction.  LV has severely decreased function.  Mildly increased LVH.  LV demonstrates global hypokinesis. -BNP 682 -As above, planning for heart catheterization -Patient started on IV Lasix  Essential hypertension -Uncontrolled however is not been taking any home medications -Started on lisinopril and Coreg -BP more controlled  Methamphetamine abuse -UDS positive for amphetamines -He admits to using meth, with last usage approximately 2 days ago.  States he is a daily user -Counseled -Patient would like to quit -TOC consulted  Tobacco abuse -Patient states he is down to smoking 3 cigarettes/day -Trying to quit -Counseled  Hepatitis C -Not treated -Need to follow-up with PCP or infectious disease or GI as an outpatient   DVT Prophylaxis  heparin  Code Status: Full  Family Communication: None at bedside  Disposition Plan: Admitted. Diuresis. Dispo likely home when stable.   Consultants Cardiology  Procedures  Echocardiogram R/LHC  Antibiotics   Anti-infectives (From admission, onward)   Start     Dose/Rate Route Frequency Ordered Stop   04/30/19 0000  azithromycin (ZITHROMAX) 500 mg in sodium chloride 0.9 % 250  mL IVPB  Status:  Discontinued     500 mg 250 mL/hr over 60 Minutes Intravenous Every 24 hours 04/29/19 0022 04/29/19 0211   04/29/19 0030  azithromycin (ZITHROMAX) 500 mg in sodium chloride 0.9 % 250 mL IVPB     500 mg 250 mL/hr over 60 Minutes  Intravenous  Once 04/29/19 0028 04/29/19 0231      Subjective:   [name] seen and examined today. Patient was ready for his cardiac cath. Denies current chest pain. Feels SOB has midly improved, but worse with exertion. Denies abdominal pain, N/V/C/D.  Objective:   Vitals:   04/30/19 1038 04/30/19 1043 04/30/19 1048 04/30/19 1053  BP: (!) 145/104 (!) 148/97 (!) 138/101 (!) 134/95  Pulse: 95 95 93 93  Resp: 18 (!) Temp:      TempSrc:      SpO2: 99% 99% 100% 99%  Weight:      Height:        Intake/Output Summary (Last 24 hours) at 04/30/2019 1239 Last data filed at 04/30/2019 0300 Gross per 24 hour  Intake 1138.46 ml  Output -  Net 1138.46 ml   Filed Weights   04/29/19 0207  Weight: 86.5 kg   Exam  General: Well developed, well nourished, NAD, appears stated age  HEENT: NCAT, mucous membranes moist.   Cardiovascular: S1 S2 auscultated, RRR  Respiratory: Clear to auscultation bilaterally  Abdomen: Soft, nontender, nondistended, + bowel sounds  Extremities: warm dry without cyanosis clubbing or edema  Neuro: AAOx3, nonfocal  Psych: Normal affect and demeanor   Data Reviewed: I have personally reviewed following labs and imaging studies  CBC: Recent Labs  Lab 04/28/19 2014 04/29/19 0303 04/30/19 0300  WBC 16.3* 14.3* 18.2*  HGB 15.7 16.2 15.1  HCT 47.5 49.3 45.8  MCV 98.3 98.6 100.0  PLT 242 250 243   Basic Metabolic Panel: Recent Labs  Lab 04/28/19 2014 04/30/19 0300  NA 137 141  K 4.3 4.9  CL 101 104  CO2 25 29  GLUCOSE 109* 119*  BUN 9 26*  CREATININE 1.02 1.14  CALCIUM 9.0 9.2   GFR: Estimated Creatinine Clearance: 77.5 mL/min (by C-G formula based on SCr of 1.14 mg/dL). Liver Function Tests: No results for input(s): AST, ALT, ALKPHOS, BILITOT, PROT, ALBUMIN in the last 168 hours. No results for input(s): LIPASE, AMYLASE in the last 168 hours. No results for input(s): AMMONIA in the last 168 hours. Coagulation Profile:  Recent Labs  Lab 04/28/19 2014  INR 1.0   Cardiac Enzymes: No results for input(s): CKTOTAL, CKMB, CKMBINDEX, TROPONINI in the last 168 hours. BNP (last 3 results) No results for input(s): PROBNP in the last 8760 hours. HbA1C: Recent Labs    04/29/19 0303  HGBA1C 5.5   CBG: No results for input(s): GLUCAP in the last 168 hours. Lipid Profile: Recent Labs    04/29/19 0303  CHOL 215*  HDL 93  LDLCALC 111*  TRIG 57  CHOLHDL 2.3   Thyroid Function Tests: No results for input(s): TSH, T4TOTAL, FREET4, T3FREE, THYROIDAB in the last 72 hours. Anemia Panel: No results for input(s): VITAMINB12, FOLATE, FERRITIN, TIBC, IRON, RETICCTPCT in the last 72 hours. Urine analysis:    Component Value Date/Time   COLORURINE YELLOW 06/16/2018 2222   APPEARANCEUR HAZY (A) 06/16/2018 2222   LABSPEC 1.017 06/16/2018 2222   PHURINE 7.0 06/16/2018 2222   GLUCOSEU NEGATIVE 06/16/2018 2222   HGBUR NEGATIVE 06/16/2018 2222   BILIRUBINUR NEGATIVE 06/16/2018 2222  KETONESUR NEGATIVE 06/16/2018 2222   PROTEINUR NEGATIVE 06/16/2018 2222   NITRITE NEGATIVE 06/16/2018 2222   LEUKOCYTESUR NEGATIVE 06/16/2018 2222   Sepsis Labs: @LABRCNTIP (procalcitonin:4,lacticidven:4)  ) Recent Results (from the past 240 hour(s))  Respiratory Panel by RT PCR (Flu A&B, Covid) - Nasopharyngeal Swab     Status: None   Collection Time: 04/28/19  9:59 PM   Specimen: Nasopharyngeal Swab  Result Value Ref Range Status   SARS Coronavirus 2 by RT PCR NEGATIVE NEGATIVE Final    Comment: (NOTE) SARS-CoV-2 target nucleic acids are NOT DETECTED. The SARS-CoV-2 RNA is generally detectable in upper respiratoy specimens during the acute phase of infection. The lowest concentration of SARS-CoV-2 viral copies this assay can detect is 131 copies/mL. A negative result does not preclude SARS-Cov-2 infection and should not be used as the sole basis for treatment or other patient management decisions. A negative result may  occur with  improper specimen collection/handling, submission of specimen other than nasopharyngeal swab, presence of viral mutation(s) within the areas targeted by this assay, and inadequate number of viral copies (<131 copies/mL). A negative result must be combined with clinical observations, patient history, and epidemiological information. The expected result is Negative. Fact Sheet for Patients:  06/26/19 Fact Sheet for Healthcare Providers:  https://www.moore.com/ This test is not yet ap proved or cleared by the https://www.young.biz/ FDA and  has been authorized for detection and/or diagnosis of SARS-CoV-2 by FDA under an Emergency Use Authorization (EUA). This EUA will remain  in effect (meaning this test can be used) for the duration of the COVID-19 declaration under Section 564(b)(1) of the Act, 21 U.S.C. section 360bbb-3(b)(1), unless the authorization is terminated or revoked sooner.    Influenza A by PCR NEGATIVE NEGATIVE Final   Influenza B by PCR NEGATIVE NEGATIVE Final    Comment: (NOTE) The Xpert Xpress SARS-CoV-2/FLU/RSV assay is intended as an aid in  the diagnosis of influenza from Nasopharyngeal swab specimens and  should not be used as a sole basis for treatment. Nasal washings and  aspirates are unacceptable for Xpert Xpress SARS-CoV-2/FLU/RSV  testing. Fact Sheet for Patients: [place] Fact Sheet for Healthcare Providers: https://www.moore.com/ This test is not yet approved or cleared by the https://www.young.biz/ FDA and  has been authorized for detection and/or diagnosis of SARS-CoV-2 by  FDA under an Emergency Use Authorization (EUA). This EUA will remain  in effect (meaning this test can be used) for the duration of the  Covid-19 declaration under Section 564(b)(1) of the Act, 21  U.S.C. section 360bbb-3(b)(1), unless the authorization is  terminated or revoked.  Performed at [facility], [address], [place], [place]       Radiology Studies: DG Chest 2 View  Result Date: 04/28/2019 CLINICAL DATA:  Chest pain, shortness of breath EXAM: CHEST - 2 VIEW COMPARISON:  04/25/2019 FINDINGS: Heart and mediastinal contours are within normal limits. No focal opacities or effusions. No acute bony abnormality. IMPRESSION: No active cardiopulmonary disease. Electronically Signed   By: 06/23/2019 M.D.   On: 04/28/2019 20:24   CARDIAC CATHETERIZATION  Result Date: 04/30/2019  Prox RCA to Mid RCA lesion is 50% stenosed - In-stent re-stenosis of distal 1/2 of stented segment (by report 3 overlapping stents). The remainder of the stent segment is widely patent.  Prox LAD lesion is 40% stenosed with 50% stenosed side branch in 1st Diag.  Mid Cx lesion is 25% stenosed.  Hemodynamic findings consistent with severe pulmonary hypertension.  LV end  diastolic pressure is severely elevated.  SUMMARY  Moderate in-stent restenosis of the RCA with moderate bifurcation LAD-diagonal disease but no obstructive disease.  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  Have written for 80 mg of Lasix x1 today and then 40 mg IV twice daily starting tomorrow.  If urine output does not pick up, may want to consider milrinone. [name], MD  ECHOCARDIOGRAM COMPLETE  Result Date: 04/29/2019   ECHOCARDIOGRAM REPORT   Patient Name:   ARLEIGH GREENIA Date of Exam: 04/29/2019 Medical Rec #:  [id number]   Height:       72.0 in Accession #:    [id number]  Weight:       190.7 lb Date of Birth:  [birth year]   BSA:          2.09 m Patient Age:    58 years     BP:           156/112 mmHg Patient Gender: M           HR:           108 bpm. Exam Location:  Inpatient Procedure: 2D Echo, Cardiac Doppler and Color Doppler Indications:    CAD of Native Vessel 414.01/I25.10  History:        Patient has no prior history of Echocardiogram examinations.                 Previous Myocardial Infarction, COPD; Risk Factors:Hypertension.  Sonographer:    Elmarie Shiley Dance Referring Phys: [id number] ARAVIND CHANDRA IMPRESSIONS  1. Left ventricular ejection fraction, by visual estimation, is 25 to 30%. The left ventricle has severely decreased function. There is mildly increased left ventricular hypertrophy.  2. Left ventricular diastolic parameters are consistent with Grade III diastolic dysfunction (restrictive).  3. The left ventricle demonstrates global hypokinesis.  4. Global right ventricle has normal systolic function.The right ventricular size is normal.  5. Left atrial size was mildly dilated.  6. Right atrial size was normal.  7. The mitral valve is normal in structure. Trivial mitral valve regurgitation. No evidence of mitral stenosis.  8. The tricuspid valve is normal in structure.  9. The aortic valve is tricuspid. Aortic valve regurgitation is not visualized. Mild aortic valve sclerosis without stenosis. 10. The pulmonic valve was normal in structure. Pulmonic valve regurgitation is not visualized. 11. The inferior vena cava is normal in size with greater than 50% respiratory variability, suggesting right atrial pressure of 3 mmHg. 12. Severe global reduction in LV systolic function; restrictive filling; mild LVH; mild LAE. FINDINGS  Left Ventricle: Left ventricular ejection fraction, by visual estimation, is 25 to 30%. The left ventricle has severely decreased function. The left ventricle demonstrates global hypokinesis. There is mildly increased left ventricular hypertrophy. Left ventricular diastolic parameters are consistent with Grade III diastolic dysfunction (restrictive).  Normal left atrial pressure. Right Ventricle: The right ventricular size is normal.Global RV systolic function is has normal systolic function. Left Atrium: Left atrial size was mildly dilated. Right Atrium: Right atrial size was normal in size Pericardium: There is no evidence of pericardial effusion. Mitral Valve: The mitral valve is normal in structure. Trivial mitral valve regurgitation. No evidence of mitral valve stenosis by observation. Tricuspid Valve: The tricuspid  valve is normal in structure. Tricuspid valve regurgitation is trivial. Aortic Valve: The aortic valve is tricuspid. Aortic valve regurgitation is not visualized. Mild aortic valve sclerosis is present, with no evidence of aortic valve stenosis. Pulmonic Valve: The pulmonic valve was normal in structure. Pulmonic valve regurgitation is not visualized. Pulmonic regurgitation is not visualized. Aorta: The aortic root is normal in size and structure. Venous: The inferior vena cava is normal in size with greater than 50% respiratory variability, suggesting right atrial pressure of 3 mmHg. IAS/Shunts: No atrial level shunt detected by color flow Doppler. Additional Comments: Severe global reduction in LV systolic function; restrictive filling; mild LVH; mild LAE.  LEFT VENTRICLE PLAX 2D LVIDd:         4.60 cm  Diastology LVIDs:         3.70 cm  LV e' lateral:   5.87 cm/s LV PW:         1.30 cm  LV E/e' lateral: 16.0 LV IVS:        1.00 cm  LV e' medial:    5.98 cm/s LVOT diam:     2.10 cm  LV E/e' medial:  15.7 LV SV:         39 ml LV SV Index:   18.62 LVOT Area:     3.46 cm  RIGHT VENTRICLE             IVC RV Basal diam:  3.00 cm     IVC diam: 2.10 cm RV Mid diam:    1.60 cm RV S prime:     10.20 cm/s TAPSE (M-mode): 1.7 cm LEFT ATRIUM             Index       RIGHT ATRIUM           Index LA diam:        3.30 cm 1.58 cm/m  RA Area:     17.00 cm LA Vol (A2C):   61.9 ml 29.62 ml/m RA Volume:   46.10 ml  22.08 ml/m LA Vol (A4C):   68.1 ml 32.62  ml/m LA Biplane Vol: 62.9 ml 30.13 ml/m  AORTIC VALVE LVOT Vmax:   71.10 cm/s LVOT Vmean:  38.900 cm/s LVOT VTI:    0.115 m  AORTA Ao Root diam: 3.60 cm Ao Asc diam:  2.40 cm MITRAL VALVE MV Area (PHT): 3.19 cm            SHUNTS MV PHT:        68.88 msec          Systemic VTI:  0.12 m MV Decel Time: 238 msec            Systemic Diam: 2.10 cm MV E velocity: 93.85 cm/s 103 cm/s  Kirk Ruths MD Electronically signed by Kirk Ruths MD Signature Date/Time: 04/29/2019/12:17:09 PM    Final      Scheduled Meds: . aspirin EC  81 mg Oral Daily  . atorvastatin  80 mg Oral q1800  . carvedilol  12.5 mg Oral BID WC  . [START ON 05/01/2019] furosemide  40 mg Intravenous BID  . [START ON 05/01/2019] furosemide  40 mg Intravenous BID  . furosemide  80 mg Intravenous Once  . heparin  5,000 Units Subcutaneous Q8H  . lisinopril  10 mg Oral Daily  . nicotine  14 mg Transdermal Daily  . nitroGLYCERIN  0.5 inch Topical Q6H  . sodium chloride flush  3 mL Intravenous Q12H  . sodium chloride  flush  3 mL Intravenous Q12H   Continuous Infusions: . sodium chloride 75 mL/hr at 04/30/19 1139  . sodium chloride       LOS: 1 day   Time Spent in minutes   45 minutes  Babatunde Seago D.O. on 04/30/2019 at 12:39 PM  Between 7am to 7pm - Please see pager noted on [email or web address]  After 7pm go to [email or web address]  And look for the night coverage person covering for me after hours  [facility] Office  [phone]

## 2019-04-30 NOTE — Progress Notes (Signed)
Progress Note  Patient Name: Francisco [name] Date of Encounter: 04/30/2019  Primary Cardiologist: No primary care provider on file. New  Subjective   Patient reports episode of diaphoresis and chest pressure last night. No pain currently  Inpatient Medications    Scheduled Meds: . aspirin EC  81 mg Oral Daily  . atorvastatin  80 mg Oral q1800  . carvedilol  12.5 mg Oral BID WC  . lisinopril  10 mg Oral Daily  . nicotine  14 mg Transdermal Daily  . nitroGLYCERIN  0.5 inch Topical Q6H  . sodium chloride flush  3 mL Intravenous Q12H   Continuous Infusions: . sodium chloride    . sodium chloride 1 mL/kg/hr (04/29/19 2006)  . heparin Stopped (04/30/19 0828)   PRN Meds: sodium chloride, acetaminophen **OR** acetaminophen, ipratropium-albuterol, morphine injection, nitroGLYCERIN, sodium chloride flush   Vital Signs    Vitals:   04/29/19 1518 04/29/19 1732 04/29/19 2130 04/30/19 0414  BP: 115/82 (!) 141/109 (!) 149/105 (!) 150/109  Pulse: (!) 103 (!) 108 (!) 106   Resp: (!) 21     Temp: 97.7 F (36.5 C)  (!) 97.5 F (36.4 C)   TempSrc: Oral  Oral   SpO2: 94%  97% 100%  Weight:      Height:        Intake/Output Summary (Last 24 hours) at 04/30/2019 0932 Last data filed at 04/30/2019 0300 Gross per 24 hour  Intake 1138.46 ml  Output --  Net 1138.46 ml   Last 3 Weights 04/29/2019 04/25/2019 04/16/2019  Weight (lbs) 190 lb 11.2 oz 180 lb 180 lb  Weight (kg) 86.501 kg 81.647 kg 81.647 kg      Telemetry    NSR. 16 beat run of SVT- Personally Reviewed  ECG    04/27/18: sinus tachy with inferolateral T wave inversion - Personally Reviewed  Physical Exam   GEN: No acute distress.   Neck: No JVD Cardiac: RRR, no murmurs, rubs, or gallops.  Respiratory: Clear to auscultation bilaterally. GI: Soft, nontender, non-distended  MS: No edema; No deformity. Neuro:  Nonfocal  Psych: Normal affect   Labs    High Sensitivity Troponin:   Recent Labs  Lab 04/28/19 2014  04/28/19 2153 04/29/19 0023 04/29/19 0303  TROPONINIHS 43* 46* 45* 37*      Chemistry Recent Labs  Lab 04/28/19 2014 04/30/19 0300  NA 137 141  K 4.3 4.9  CL 101 104  CO2 25 29  GLUCOSE 109* 119*  BUN 9 26*  CREATININE 1.02 1.14  CALCIUM 9.0 9.2  GFRNONAA >60 >60  GFRAA >60 >60  ANIONGAP 11 8     Hematology Recent Labs  Lab 04/28/19 2014 04/29/19 0303 04/30/19 0300  WBC 16.3* 14.3* 18.2*  RBC 4.83 5.00 4.58  HGB 15.7 16.2 15.1  HCT 47.5 49.3 45.8  MCV 98.3 98.6 100.0  MCH 32.5 32.4 33.0  MCHC 33.1 32.9 33.0  RDW 13.9 14.1 14.4  PLT 242 250 243    BNP Recent Labs  Lab 04/29/19 0303  BNP 682.1*     DDimer  Recent Labs  Lab 04/28/19 2128  DDIMER <0.27     Radiology    DG Chest 2 View  Result Date: 04/28/2019 CLINICAL DATA:  Chest pain, shortness of breath EXAM: CHEST - 2 VIEW COMPARISON:  04/25/2019 FINDINGS: Heart and mediastinal contours are within normal limits. No focal opacities or effusions. No acute bony abnormality. IMPRESSION: No active cardiopulmonary disease. Electronically Signed   By: Caryn Bee  [place]over M.D.   On: 04/28/2019 20:24   ECHOCARDIOGRAM COMPLETE  Result Date: 04/29/2019   ECHOCARDIOGRAM REPORT   Patient Name:   Francisco [name] Date of Exam: 04/29/2019 Medical Rec #:  [id number]   Height:       72.0 in Accession #:    [id number]  Weight:       190.7 lb Date of Birth:  06/13/1960   BSA:          2.09 m Patient Age:    59 years    BP:           156/112 mmHg Patient Gender: M           HR:           108 bpm. Exam Location:  Inpatient Procedure: 2D Echo, Cardiac Doppler and Color Doppler Indications:    CAD of Native Vessel 414.01/I25.10  History:        Patient has no prior history of Echocardiogram examinations.                 Previous Myocardial Infarction, COPD; Risk Factors:Hypertension.  Sonographer:    Elmarie Shiley Dance Referring Phys: [id number] ARAVIND CHANDRA IMPRESSIONS  1. Left ventricular ejection fraction, by visual estimation, is 25 to 30%.  The left ventricle has severely decreased function. There is mildly increased left ventricular hypertrophy.  2. Left ventricular diastolic parameters are consistent with Grade III diastolic dysfunction (restrictive).  3. The left ventricle demonstrates global hypokinesis.  4. Global right ventricle has normal systolic function.The right ventricular size is normal.  5. Left atrial size was mildly dilated.  6. Right atrial size was normal.  7. The mitral valve is normal in structure. Trivial mitral valve regurgitation. No evidence of mitral stenosis.  8. The tricuspid valve is normal in structure.  9. The aortic valve is tricuspid. Aortic valve regurgitation is not visualized. Mild aortic valve sclerosis without stenosis. 10. The pulmonic valve was normal in structure. Pulmonic valve regurgitation is not visualized. 11. The inferior vena cava is normal in size with greater than 50% respiratory variability, suggesting right atrial pressure of 3 mmHg. 12. Severe global reduction in LV systolic function; restrictive filling; mild LVH; mild LAE. FINDINGS  Left Ventricle: Left ventricular ejection fraction, by visual estimation, is 25 to 30%. The left ventricle has severely decreased function. The left ventricle demonstrates global hypokinesis. There is mildly increased left ventricular hypertrophy. Left ventricular diastolic parameters are consistent with Grade III diastolic dysfunction (restrictive). Normal left atrial pressure. Right Ventricle: The right ventricular size is normal.Global RV systolic function is has normal systolic function. Left Atrium: Left atrial size was mildly dilated. Right Atrium: Right atrial size was normal in size Pericardium: There is no evidence of pericardial effusion. Mitral Valve: The mitral valve is normal in structure. Trivial mitral valve regurgitation. No evidence of mitral valve stenosis by observation. Tricuspid Valve: The tricuspid valve is normal in structure. Tricuspid valve  regurgitation is trivial. Aortic Valve: The aortic valve is tricuspid. Aortic valve regurgitation is not visualized. Mild aortic valve sclerosis is present, with no evidence of aortic valve stenosis. Pulmonic Valve: The pulmonic valve was normal in structure. Pulmonic valve regurgitation is not visualized. Pulmonic regurgitation is not visualized. Aorta: The aortic root is normal in size and structure. Venous: The inferior vena cava is normal in size with greater than 50% respiratory variability, suggesting right atrial pressure of 3 mmHg. IAS/Shunts: No atrial level shunt detected by color flow Doppler. Additional Comments: Severe global reduction  in LV systolic function; restrictive filling; mild LVH; mild LAE.  LEFT VENTRICLE PLAX 2D LVIDd:         4.60 cm  Diastology LVIDs:         3.70 cm  LV e' lateral:   5.87 cm/s LV PW:         1.30 cm  LV E/e' lateral: 16.0 LV IVS:        1.00 cm  LV e' medial:    5.98 cm/s LVOT diam:     2.10 cm  LV E/e' medial:  15.7 LV SV:         39 ml LV SV Index:   18.62 LVOT Area:     3.46 cm  RIGHT VENTRICLE             IVC RV Basal diam:  3.00 cm     IVC diam: 2.10 cm RV Mid diam:    1.60 cm RV S prime:     10.20 cm/s TAPSE (M-mode): 1.7 cm LEFT ATRIUM             Index       RIGHT ATRIUM           Index LA diam:        3.30 cm 1.58 cm/m  RA Area:     17.00 cm LA Vol (A2C):   61.9 ml 29.62 ml/m RA Volume:   46.10 ml  22.08 ml/m LA Vol (A4C):   68.1 ml 32.62 ml/m LA Biplane Vol: 62.9 ml 30.13 ml/m  AORTIC VALVE LVOT Vmax:   71.10 cm/s LVOT Vmean:  38.900 cm/s LVOT VTI:    0.115 m  AORTA Ao Root diam: 3.60 cm Ao Asc diam:  2.40 cm MITRAL VALVE MV Area (PHT): 3.19 cm            SHUNTS MV PHT:        68.88 msec          Systemic VTI:  0.12 m MV Decel Time: 238 msec            Systemic Diam: 2.10 cm MV E velocity: 93.85 cm/s 103 cm/s  Olga Millers MD Electronically signed by Olga Millers MD Signature Date/Time: 04/29/2019/12:17:09 PM    Final     Cardiac Studies   See  above  Patient Profile     59 y.o. male with a hx of COPD, CAD s/p PCI (5 yrs prior, in [place]), medication non-compliance, Hep C (not treated per notes), IVDU (previously heroin, now meth), tobacco use who is being seen today for the evaluation of chest pressure and abnormal echocardiogram at the request of Dr. Karren Burly  Assessment & Plan    1. Chest pain and dyspnea ? Secondary to ischemia versus CHF. Patient with prior coronary stents- 3 per his report with similar symptoms. Initiated on Coreg, ASA, high dose statin. Will proceed with [facility] today with possible PCI. The procedure and risks were reviewed including but not limited to death, myocardial infarction, stroke, arrythmias, bleeding, transfusion, emergency surgery, dye allergy, or renal dysfunction. The patient voices understanding and is agreeable to proceed.  2. Mildly elevated troponin in context of h/o CAD/CHF - see above  3. Acute systolic CHF. EF 25-30%. New.  - this may be ischemic but could be related to substance abuse. [facility] today. Initiated on low dose Coreg and lisinopril yesterday. Will increase dose of both since still hypertensive and tachycardic. Stressed importance of compliance with medical therapy.   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 I challenged him to stop using illegal drugs- he seems motivated to do so.  5. HLD. On statin now.      For questions or updates, please contact [place] Please consult [email or web address] for contact info under        Signed, Lissandra Keil [place], MD  04/30/2019, 9:32 AM

## 2019-04-30 NOTE — Interval H&P Note (Signed)
History and Physical Interval Note:  04/30/2019 9:52 AM  Francisco [name]  has presented today for surgery, with the diagnosis of  New Dx of Cardiomyopathy.   The various methods of treatment have been discussed with the patient and family. After consideration of risks, benefits and other options for treatment, the patient has consented to  Procedure(s): RIGHT/LEFT HEART CATH AND CORONARY ANGIOGRAPHY (N/A)  PERCUTANEOUS CORONARY INTERVENTION  as a surgical intervention.  The patient's history has been reviewed, patient examined, no change in status, stable for surgery.  I have reviewed the patient's chart and labs.  Questions were answered to the patient's satisfaction.    Cath Lab Visit (complete for each Cath Lab visit)  Clinical Evaluation Leading to the Procedure:   ACS: Yes.    Non-ACS:    Anginal Classification: CCS III  Anti-ischemic medical therapy: Maximal Therapy (2 or more classes of medications)  Non-Invasive Test Results: High-risk stress test findings: cardiac mortality >3%/year; NEW low EF on Echo  Prior CABG: No previous CABG   Francisco [name]

## 2019-04-30 NOTE — TOC Initial Note (Signed)
Transition of Care [facility]) - Initial/Assessment Note    Patient Details  Name: Francisco [name] MRN: [id number] Date of Birth: 01-Oct-1960  Transition of Care [facility]) CM/SW Contact:    Gala Lewandowsky, RN Phone Number: 04/30/2019, 3:32 PM  Clinical Narrative: Patient presented for shortness of breath and chest pain. Patient has not been in [place] long. He is living at [facility] a sober living home in [place]. Patient has not worked in 3 weeks and he has no primary care provider. Patient is agreeable to a hospital follow up appointment with one of the three indigent clinics in [place]. Appointment will be placed on the after visit summary before transition home. Patient is without any money. MATCH completed and medications will need to be sent to the Transition of Care Pharmacy to be delivered to room to make sure he can get all medications prior to transition home. Clinical Social Worker assisting with substance abuse information. No further needs from Case Manager at this time.                     Expected Discharge Plan: Home/Self Care Barriers to Discharge: No Barriers Identified   Patient Goals and CMS Choice Patient states their goals for this hospitalization and ongoing recovery are:: "to return to sober living house"   Choice offered to / list presented to : NA  Expected Discharge Plan and Services Expected Discharge Plan: Home/Self Care In-house Referral: NA Discharge Planning Services: CM Consult, Follow-up appt scheduled, Indigent Health Clinic, [facility] Program, Medication Assistance   Living arrangements for the past 2 months: Boarding House([facility]ber Live[facility] house called [facility]                           HH Arranged: NA          Prior Living Arrangements/Services Living arrangements for the past 2 months: Boarding [facility] house called [facility] Lives with:: Roommate Patient language and need for interpreter reviewed::  Yes Do you feel safe going back to the place where you live?: Yes      Need for Family Participation in Patient Care: Yes (Comment) Care giver support system in place?: Yes (comment)   Criminal Activity/Legal Involvement Pertinent to Current Situation/Hospitalization: No - Comment as needed  Activities of Daily Living   ADL Screening (condition at time of admission) [name] cognitive ability adequate to safely complete daily activities?: Yes Is the patient deaf or have difficulty hearing?: No Does the patient have difficulty seeing, even when wearing glasses/contacts?: No Does the patient have difficulty concentrating, remembering, or making decisions?: No Patient able to express need for assistance with ADLs?: No Does the patient have difficulty dressing or bathing?: No Independently performs ADLs?: Yes (appropriate for developmental age) Does the patient have difficulty walking or climbing stairs?: No Weakness of Legs: None Weakness of Arms/Hands: None  Permission Sought/Granted Permission sought to share information with : Family Supports                Emotional Assessment Appearance:: Appears stated age Attitude/Demeanor/Rapport: Engaged Affect (typically observed): Appropriate Orientation: : Oriented to Situation, Oriented to  Time, Oriented to Place, Oriented to Self Alcohol / Substance Use: Not Applicable Psych Involvement: No (comment)  Admission diagnosis:  Unstable angina (HCC) [I20.0] ACS (acute coronary syndrome) (HCC) [I24.9] Elevated troponin [R77.8] Chronic obstructive pulmonary disease, unspecified COPD type (HCC) [J44.9] Patient Active Problem List   Diagnosis Date Noted  .  Chronic obstructive pulmonary disease ([place])   . Elevated troponin   . Unstable angina ([place])   . Dilated cardiomyopathy ([place])   . CAD S/P percutaneous coronary angioplasty   . Acute combined systolic and diastolic heart failure ([place])   . ACS (acute coronary syndrome) ([place])  04/29/2019  . Acute respiratory failure with hypoxia ([place]) 06/17/2018   PCP:  Patient, No Pcp Per Pharmacy:   [id number] [address], [place] - [address] [place] [address] Phone: [phone] Fax: [phone]     Social Determinants of Health (SDOH) Interventions    Readmission Risk Interventions No flowsheet data found.

## 2019-04-30 NOTE — H&P (View-Only) (Signed)
Progress Note  Patient Name: Francisco [name] Date of Encounter: 04/30/2019  Primary Cardiologist: No primary care provider on file. New  Subjective   Patient reports episode of diaphoresis and chest pressure last night. No pain currently  Inpatient Medications    Scheduled Meds: . aspirin EC  81 mg Oral Daily  . atorvastatin  80 mg Oral q1800  . carvedilol  12.5 mg Oral BID WC  . lisinopril  10 mg Oral Daily  . nicotine  14 mg Transdermal Daily  . nitroGLYCERIN  0.5 inch Topical Q6H  . sodium chloride flush  3 mL Intravenous Q12H   Continuous Infusions: . sodium chloride    . sodium chloride 1 mL/kg/hr (04/29/19 2006)  . heparin Stopped (04/30/19 0828)   PRN Meds: sodium chloride, acetaminophen **OR** acetaminophen, ipratropium-albuterol, morphine injection, nitroGLYCERIN, sodium chloride flush   Vital Signs    Vitals:   04/29/19 1518 04/29/19 1732 04/29/19 2130 04/30/19 0414  BP: 115/82 (!) 141/109 (!) 149/105 (!) 150/109  Pulse: (!) 103 (!) 108 (!) 106   Resp: (!) 21     Temp: 97.7 F (36.5 C)  (!) 97.5 F (36.4 C)   TempSrc: Oral  Oral   SpO2: 94%  97% 100%  Weight:      Height:        Intake/Output Summary (Last 24 hours) at 04/30/2019 0932 Last data filed at 04/30/2019 0300 Gross per 24 hour  Intake 1138.46 ml  Output --  Net 1138.46 ml   Last 3 Weights 04/29/2019 04/25/2019 04/16/2019  Weight (lbs) 190 lb 11.2 oz 180 lb 180 lb  Weight (kg) 86.501 kg 81.647 kg 81.647 kg      Telemetry    NSR. 16 beat run of SVT- Personally Reviewed  ECG    04/27/18: sinus tachy with inferolateral T wave inversion - Personally Reviewed  Physical Exam   GEN: No acute distress.   Neck: No JVD Cardiac: RRR, no murmurs, rubs, or gallops.  Respiratory: Clear to auscultation bilaterally. GI: Soft, nontender, non-distended  MS: No edema; No deformity. Neuro:  Nonfocal  Psych: Normal affect   Labs    High Sensitivity Troponin:   Recent Labs  Lab 04/28/19 2014  04/28/19 2153 04/29/19 0023 04/29/19 0303  TROPONINIHS 43* 46* 45* 37*      Chemistry Recent Labs  Lab 04/28/19 2014 04/30/19 0300  NA 137 141  K 4.3 4.9  CL 101 104  CO2 25 29  GLUCOSE 109* 119*  BUN 9 26*  CREATININE 1.02 1.14  CALCIUM 9.0 9.2  GFRNONAA >60 >60  GFRAA >60 >60  ANIONGAP 11 8     Hematology Recent Labs  Lab 04/28/19 2014 04/29/19 0303 04/30/19 0300  WBC 16.3* 14.3* 18.2*  RBC 4.83 5.00 4.58  HGB 15.7 16.2 15.1  HCT 47.5 49.3 45.8  MCV 98.3 98.6 100.0  MCH 32.5 32.4 33.0  MCHC 33.1 32.9 33.0  RDW 13.9 14.1 14.4  PLT 242 250 243    BNP Recent Labs  Lab 04/29/19 0303  BNP 682.1*     DDimer  Recent Labs  Lab 04/28/19 2128  DDIMER <0.27     Radiology    DG Chest 2 View  Result Date: 04/28/2019 CLINICAL DATA:  Chest pain, shortness of breath EXAM: CHEST - 2 VIEW COMPARISON:  04/25/2019 FINDINGS: Heart and mediastinal contours are within normal limits. No focal opacities or effusions. No acute bony abnormality. IMPRESSION: No active cardiopulmonary disease. Electronically Signed   By: Caryn Bee  [place]over M.D.   On: 04/28/2019 20:24   ECHOCARDIOGRAM COMPLETE  Result Date: 04/29/2019   ECHOCARDIOGRAM REPORT   Patient Name:   Francisco [name] Date of Exam: 04/29/2019 Medical Rec #:  [id number]   Height:       72.0 in Accession #:    [id number]  Weight:       190.7 lb Date of Birth:  06/12/1960   BSA:          2.09 m Patient Age:    58 years    BP:           156/112 mmHg Patient Gender: M           HR:           108 bpm. Exam Location:  Inpatient Procedure: 2D Echo, Cardiac Doppler and Color Doppler Indications:    CAD of Native Vessel 414.01/I25.10  History:        Patient has no prior history of Echocardiogram examinations.                 Previous Myocardial Infarction, COPD; Risk Factors:Hypertension.  Sonographer:    Tiffany Dance Referring Phys: [id number] ARAVIND CHANDRA IMPRESSIONS  1. Left ventricular ejection fraction, by visual estimation, is 25 to 30%.  The left ventricle has severely decreased function. There is mildly increased left ventricular hypertrophy.  2. Left ventricular diastolic parameters are consistent with Grade III diastolic dysfunction (restrictive).  3. The left ventricle demonstrates global hypokinesis.  4. Global right ventricle has normal systolic function.The right ventricular size is normal.  5. Left atrial size was mildly dilated.  6. Right atrial size was normal.  7. The mitral valve is normal in structure. Trivial mitral valve regurgitation. No evidence of mitral stenosis.  8. The tricuspid valve is normal in structure.  9. The aortic valve is tricuspid. Aortic valve regurgitation is not visualized. Mild aortic valve sclerosis without stenosis. 10. The pulmonic valve was normal in structure. Pulmonic valve regurgitation is not visualized. 11. The inferior vena cava is normal in size with greater than 50% respiratory variability, suggesting right atrial pressure of 3 mmHg. 12. Severe global reduction in LV systolic function; restrictive filling; mild LVH; mild LAE. FINDINGS  Left Ventricle: Left ventricular ejection fraction, by visual estimation, is 25 to 30%. The left ventricle has severely decreased function. The left ventricle demonstrates global hypokinesis. There is mildly increased left ventricular hypertrophy. Left ventricular diastolic parameters are consistent with Grade III diastolic dysfunction (restrictive). Normal left atrial pressure. Right Ventricle: The right ventricular size is normal.Global RV systolic function is has normal systolic function. Left Atrium: Left atrial size was mildly dilated. Right Atrium: Right atrial size was normal in size Pericardium: There is no evidence of pericardial effusion. Mitral Valve: The mitral valve is normal in structure. Trivial mitral valve regurgitation. No evidence of mitral valve stenosis by observation. Tricuspid Valve: The tricuspid valve is normal in structure. Tricuspid valve  regurgitation is trivial. Aortic Valve: The aortic valve is tricuspid. Aortic valve regurgitation is not visualized. Mild aortic valve sclerosis is present, with no evidence of aortic valve stenosis. Pulmonic Valve: The pulmonic valve was normal in structure. Pulmonic valve regurgitation is not visualized. Pulmonic regurgitation is not visualized. Aorta: The aortic root is normal in size and structure. Venous: The inferior vena cava is normal in size with greater than 50% respiratory variability, suggesting right atrial pressure of 3 mmHg. IAS/Shunts: No atrial level shunt detected by color flow Doppler. Additional Comments: Severe global reduction   in LV systolic function; restrictive filling; mild LVH; mild LAE.  LEFT VENTRICLE PLAX 2D LVIDd:         4.60 cm  Diastology LVIDs:         3.70 cm  LV e' lateral:   5.87 cm/s LV PW:         1.30 cm  LV E/e' lateral: 16.0 LV IVS:        1.00 cm  LV e' medial:    5.98 cm/s LVOT diam:     2.10 cm  LV E/e' medial:  15.7 LV SV:         39 ml LV SV Index:   18.62 LVOT Area:     3.46 cm  RIGHT VENTRICLE             IVC RV Basal diam:  3.00 cm     IVC diam: 2.10 cm RV Mid diam:    1.60 cm RV S prime:     10.20 cm/s TAPSE (M-mode): 1.7 cm LEFT ATRIUM             Index       RIGHT ATRIUM           Index LA diam:        3.30 cm 1.58 cm/m  RA Area:     17.00 cm LA Vol (A2C):   61.9 ml 29.62 ml/m RA Volume:   46.10 ml  22.08 ml/m LA Vol (A4C):   68.1 ml 32.62 ml/m LA Biplane Vol: 62.9 ml 30.13 ml/m  AORTIC VALVE LVOT Vmax:   71.10 cm/s LVOT Vmean:  38.900 cm/s LVOT VTI:    0.115 m  AORTA Ao Root diam: 3.60 cm Ao Asc diam:  2.40 cm MITRAL VALVE MV Area (PHT): 3.19 cm            SHUNTS MV PHT:        68.88 msec          Systemic VTI:  0.12 m MV Decel Time: 238 msec            Systemic Diam: 2.10 cm MV E velocity: 93.85 cm/s 103 cm/s  Olga Millers MD Electronically signed by Olga Millers MD Signature Date/Time: 04/29/2019/12:17:09 PM    Final     Cardiac Studies   See  above  Patient Profile     59 y.o. male with a hx of COPD, CAD s/p PCI (5 yrs prior, in [place]), medication non-compliance, Hep C (not treated per notes), IVDU (previously heroin, now meth), tobacco use who is being seen today for the evaluation of chest pressure and abnormal echocardiogram at the request of Dr. Karren Burly  Assessment & Plan    1. Chest pain and dyspnea ? Secondary to ischemia versus CHF. Patient with prior coronary stents- 3 per his report with similar symptoms. Initiated on Coreg, ASA, high dose statin. Will proceed with [facility] today with possible PCI. The procedure and risks were reviewed including but not limited to death, myocardial infarction, stroke, arrythmias, bleeding, transfusion, emergency surgery, dye allergy, or renal dysfunction. The patient voices understanding and is agreeable to proceed.  2. Mildly elevated troponin in context of h/o CAD/CHF - see above  3. Acute systolic CHF. EF 25-30%. New.  - this may be ischemic but could be related to substance abuse. [facility] today. Initiated on low dose Coreg and lisinopril yesterday. Will increase dose of both since still hypertensive and tachycardic. Stressed importance of compliance with medical therapy.   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 I challenged him to stop using illegal drugs- he seems motivated to do so.  5. HLD. On statin now.      For questions or updates, please contact [place] Please consult [email or web address] for contact info under        Signed, Deryck Hippler [place], MD  04/30/2019, 9:32 AM

## 2019-04-30 NOTE — TOC Progression Note (Signed)
Transition of Care [facility]) - Progression Note    Patient Details  Name: Francisco [name] MRN: [id number] Date of Birth: February 28, 1961  Transition of Care [facility]) CM/SW [place], [place] Phone Number: 04/30/2019, 4:31 PM  Clinical Narrative:    CSW met with pt at bedside.  Pt is receiving substance abuse counseling at [facility] [employer].  Pt will continue to receives counseling at discharge.  CSW will continue to follow.   Expected Discharge Plan: Home/Self Care Barriers to Discharge: No Barriers Identified  Expected Discharge Plan and Services Expected Discharge Plan: Home/Self Care In-house Referral: NA Discharge Planning Services: CM Consult, Follow-up appt scheduled, [facility], [place], Medication Assistance   Living arrangements for the past 2 months: Boarding House([facility] [place] [facility], technical sales.)                           HH Arranged: NA           Social Determinants of Health (SDOH) Interventions    Readmission Risk Interventions No flowsheet data found.

## 2019-05-01 ENCOUNTER — Telehealth: Payer: Self-pay | Admitting: Physician Assistant

## 2019-05-01 DIAGNOSIS — I5041 Acute combined systolic (congestive) and diastolic (congestive) heart failure: Secondary | ICD-10-CM

## 2019-05-01 DIAGNOSIS — Z9861 Coronary angioplasty status: Secondary | ICD-10-CM

## 2019-05-01 LAB — CBC
HCT: 44.9 % (ref 39.0–52.0)
Hemoglobin: 14.8 g/dL (ref 13.0–17.0)
MCH: 32.6 pg (ref 26.0–34.0)
MCHC: 33 g/dL (ref 30.0–36.0)
MCV: 98.9 fL (ref 80.0–100.0)
Platelets: 249 10*3/uL (ref 150–400)
RBC: 4.54 MIL/uL (ref 4.22–5.81)
RDW: 14.2 % (ref 11.5–15.5)
WBC: 15.3 10*3/uL — ABNORMAL HIGH (ref 4.0–10.5)
nRBC: 0 % (ref 0.0–0.2)

## 2019-05-01 LAB — BASIC METABOLIC PANEL
Anion gap: 9 (ref 5–15)
BUN: 22 mg/dL — ABNORMAL HIGH (ref 6–20)
CO2: 26 mmol/L (ref 22–32)
Calcium: 9 mg/dL (ref 8.9–10.3)
Chloride: 103 mmol/L (ref 98–111)
Creatinine, Ser: 1.01 mg/dL (ref 0.61–1.24)
GFR calc Af Amer: >60 mL/min (ref >60)
GFR calc non Af Amer: >60 mL/min (ref >60)
Glucose, Bld: 119 mg/dL — ABNORMAL HIGH (ref 70–99)
Potassium: 4.4 mmol/L (ref 3.5–5.1)
Sodium: 138 mmol/L (ref 135–145)

## 2019-05-01 LAB — HEPATIC FUNCTION PANEL
ALT: 55 U/L — ABNORMAL HIGH (ref 0–44)
AST: 28 U/L (ref 15–41)
Albumin: 3.2 g/dL — ABNORMAL LOW (ref 3.5–5.0)
Alkaline Phosphatase: 49 U/L (ref 38–126)
Bilirubin, Direct: 0.2 mg/dL (ref 0.0–0.2)
Indirect Bilirubin: 0.6 mg/dL (ref 0.3–0.9)
Total Bilirubin: 0.8 mg/dL (ref 0.3–1.2)
Total Protein: 6.1 g/dL — ABNORMAL LOW (ref 6.5–8.1)

## 2019-05-01 MED ORDER — SPIRONOLACTONE 12.5 MG HALF TABLET
12.5000 mg | ORAL_TABLET | Freq: Every day | ORAL | Status: DC
  Administered 2019-05-01 – 2019-05-02 (×2): 12.5 mg via ORAL
  Filled 2019-05-01 (×2): qty 1

## 2019-05-01 MED ORDER — CELECOXIB 200 MG PO CAPS
200.0000 mg | ORAL_CAPSULE | Freq: Two times a day (BID) | ORAL | Status: DC
  Administered 2019-05-01 – 2019-05-02 (×3): 200 mg via ORAL
  Filled 2019-05-01 (×4): qty 1

## 2019-05-01 MED ORDER — LIVING BETTER WITH HEART FAILURE BOOK: Freq: Once | Status: DC

## 2019-05-01 MED ORDER — LISINOPRIL 10 MG PO TABS
10.0000 mg | ORAL_TABLET | Freq: Every day | ORAL | Status: DC
  Administered 2019-05-01 – 2019-05-02 (×2): 10 mg via ORAL
  Filled 2019-05-01 (×2): qty 1

## 2019-05-01 NOTE — Progress Notes (Addendum)
Received page about patient requesting something for chronic back pain. He had discussed this with Dr. [place] this AM. Allergy listed to Ultram. Given h/o opiate abuse, he recommends to avoid narcotics. The patient takes ibuprofen at home which Dr. [place] advised against - he suggests trial of Celebrex instead.  Also to add - In anticipation of DC tomorrow I scheduled a TOC appt for 05/08/19 with care team APP, Angie Duke. Angie's schedule said she was supposed to be office AM/virtual PM that day but her template is open as virtual AM/office PM. The patient was put on for in-person appointment that day at 3:30pm. Angie has touched base with her scheduling team pending further updates - per our discussion, she will f/u tomorrow and make changes if needed. So just FYI in case appt date/time changes, will need update on AVS since I am not here tomorrow. Also sent message to [facility] pool to make phone call after dc. Per CM note, will need TOC pharmacy at discharge.  Jalie Eiland PA-C

## 2019-05-01 NOTE — Progress Notes (Signed)
PROGRESS NOTE    Francisco [name]  HYQ:[id number] DOB: 30-Dec-1960 DOA: 04/28/2019 PCP: Patient, No Pcp Per   Brief Narrative:  HPI on 04/28/19 by Dr. Clydia Llano Jermany Rimel is a 59 y.o. male with medical history significant of COPD, CAD s/p PCI (5 yrs prior, in [place]), medication non-compliance, Hep C, IVDU (previously heroin, now meth), Tobacco use who presents with ongoing SOB/Chest pressure  Patient was seen in ER on 12/28, but reports onset of his symptoms which he currently presents with again has largely unimproved since 12/25.  He states he has been wheezing and with SOB since 12/25 but he feels his bigger concern is that he cannot walk more than 5 feet before being completely spent.  He reports feeling as though someone is sitting on his chest as well.  He states he has a dry cough but no productive sputum.  He does not report and fevers or chills.  He does get diaphoretic with episodes.  No nausea/vomiting or radiation of pain reported.  He smokes currently down to 1/4 ppd and was at 2 ppd as recently as 3 months prior.  He does use meth, IV.  He denies alcohol use.  He does report increased meth use around Christmas and time of his worsening symptoms.  He reports due to how unable he is to get around, friends at the [facility] house have been delivering him meals.  He denies any orthopnea or PND.  Interim history Admitted for chest pain.  Echo showed EF 25-30%. Cardiology consulted, s/p cath.  Planning for IV diuresis. Assessment & Plan   Chest pain -Patient with history of CAD and PCI placement approximately 5 years ago in [facility] -States pain is reminiscent to previous episodes 5 years ago -Has not had pain since last night -Patient has not been taking any of his home medications, and does not have a primary doctor or cardiologist since moving to [place] -Troponins cycled and flat -Currently on heparin drip -Lipid panel obtained, cholesterol  215, LDL 111, HDL 93, triglycerides 57 -Hemoglobin A1c 5.5 -Cardiology consulted and appreciated, s/p right/left cardiac catheterization showing proximal RCA to mid RCA lesion 50% stenosed, in-stent restenosis of distal 1/2 stented segment.  Hemodynamic findings assistant with severe pulmonary hypertension.  Recommended aggressive diuresis. -Continue statin, Coreg, aspirin, lisinopril  New cardiomyopathy/combined systolic/diastolic CHF -Patient appears to be euvolemic -Echocardiogram shows EF of 25 to 30%, grade 3 diastolic dysfunction.  LV has severely decreased function.  Mildly increased LVH.  LV demonstrates global hypokinesis. -BNP 682 -Patient started on IV Lasix -Continue coreg, lisinopril, spironolactone   Essential hypertension -Uncontrolled however is not been taking any home medications -Started on lisinopril and Coreg  Methamphetamine abuse -UDS positive for amphetamines -He admits to using meth, with last usage approximately 2 days ago.  States he is a daily user -Counseled -Patient would like to quit -TOC consulted  Tobacco abuse -Patient states he is down to smoking 3 cigarettes/day -Trying to quit -Counseled  Hepatitis C -Not treated -Need to follow-up with PCP or infectious disease or GI as an outpatient   DVT Prophylaxis  heparin  Code Status: Full  Family Communication: None at bedside  Disposition Plan: Admitted. Continue IV diuresis. Pending further cardiology consult. Dispo likely home when stable.   Consultants Cardiology  Procedures  Echocardiogram R/LHC  Antibiotics   Anti-infectives (From admission, onward)   Start     Dose/Rate Route Frequency Ordered Stop   04/30/19 0000  azithromycin (ZITHROMAX) 500 mg  in sodium chloride 0.9 % 250 mL IVPB  Status:  Discontinued     500 mg 250 mL/hr over 60 Minutes Intravenous Every 24 hours 04/29/19 0022 04/29/19 0211   04/29/19 0030  azithromycin (ZITHROMAX) 500 mg in sodium chloride 0.9 % 250 mL  IVPB     500 mg 250 mL/hr over 60 Minutes Intravenous  Once 04/29/19 0028 04/29/19 0231      Subjective:   Francisco [name] seen and examined today. Patient feels breathing has improved. Wonders if he can go home. Denies further chest pain. Denies abdominal pain, nausea, vomiting, diarrhea, constipation, dizziness, headache.   Objective:   Vitals:   04/30/19 1053 04/30/19 1733 04/30/19 2008 05/01/19 0533  BP: (!) 134/95 (!) 146/86 133/73 (!) 177/120  Pulse: 93 97 90 96  Resp: 16  14 12   Temp:   98.3 F (36.8 C) 97.6 F (36.4 C)  TempSrc:   Oral Oral  SpO2: 99% 99% 96% 95%  Weight:    83.9 kg  Height:        Intake/Output Summary (Last 24 hours) at 05/01/2019 1032 Last data filed at 05/01/2019 0545 Gross per 24 hour  Intake 1793 ml  Output 3775 ml  Net -1982 ml   Filed Weights   04/29/19 0207 05/01/19 0533  Weight: 86.5 kg 83.9 kg   Exam  General: Well developed, well nourished, NAD  HEENT: NCAT, mucous membranes moist.   Cardiovascular: S1 S2 auscultated, RRR  Respiratory: Clear to auscultation bilaterally   Abdomen: Soft, nontender, nondistended, + bowel sounds  Extremities: warm dry without cyanosis clubbing or edema  Neuro: AAOx3, nonfocal  Psych: Pleasant, appropriate mood and affect  Data Reviewed: I have personally reviewed following labs and imaging studies  CBC: Recent Labs  Lab 04/28/19 2014 04/29/19 0303 04/30/19 0300 04/30/19 1015 04/30/19 1030 04/30/19 1031 04/30/19 1035 05/01/19 0429  WBC 16.3* 14.3* 18.2*  --   --   --   --  15.3*  HGB 15.7 16.2 15.1 15.3 15.3 15.0 15.3 14.8  HCT 47.5 49.3 45.8 45.0 45.0 44.0 45.0 44.9  MCV 98.3 98.6 100.0  --   --   --   --  98.9  PLT 242 250 243  --   --   --   --  249   Basic Metabolic Panel: Recent Labs  Lab 04/28/19 2014 04/30/19 0300 04/30/19 1015 04/30/19 1030 04/30/19 1031 04/30/19 1035 05/01/19 0429  NA 137 141 140 141 141 139 138  K 4.3 4.9 4.8 5.1 4.9 5.3* 4.4  CL 101 104  --   --    --   --  103  CO2 25 29  --   --   --   --  26  GLUCOSE 109* 119*  --   --   --   --  119*  BUN 9 26*  --   --   --   --  22*  CREATININE 1.02 1.14  --   --   --   --  1.01  CALCIUM 9.0 9.2  --   --   --   --  9.0   GFR: Estimated Creatinine Clearance: 87.5 mL/min (by C-G formula based on SCr of 1.01 mg/dL). Liver Function Tests: Recent Labs  Lab 05/01/19 0429  AST 28  ALT 55*  ALKPHOS 49  BILITOT 0.8  PROT 6.1*  ALBUMIN 3.2*   No results for input(s): LIPASE, AMYLASE in the last 168 hours. No results for input(s): AMMONIA  in the last 168 hours. Coagulation Profile: Recent Labs  Lab 04/28/19 2014  INR 1.0   Cardiac Enzymes: No results for input(s): CKTOTAL, CKMB, CKMBINDEX, TROPONINI in the last 168 hours. BNP (last 3 results) No results for input(s): PROBNP in the last 8760 hours. HbA1C: Recent Labs    04/29/19 0303  HGBA1C 5.5   CBG: No results for input(s): GLUCAP in the last 168 hours. Lipid Profile: Recent Labs    04/29/19 0303  CHOL 215*  HDL 93  LDLCALC 111*  TRIG 57  CHOLHDL 2.3   Thyroid Function Tests: No results for input(s): TSH, T4TOTAL, FREET4, T3FREE, THYROIDAB in the last 72 hours. Anemia Panel: No results for input(s): VITAMINB12, FOLATE, FERRITIN, TIBC, IRON, RETICCTPCT in the last 72 hours. Urine analysis:    Component Value Date/Time   COLORURINE YELLOW 06/16/2018 2222   APPEARANCEUR HAZY (A) 06/16/2018 2222   LABSPEC 1.017 06/16/2018 2222   PHURINE 7.0 06/16/2018 2222   GLUCOSEU NEGATIVE 06/16/2018 2222   HGBUR NEGATIVE 06/16/2018 2222   BILIRUBINUR NEGATIVE 06/16/2018 2222   KETONESUR NEGATIVE 06/16/2018 2222   PROTEINUR NEGATIVE 06/16/2018 2222   NITRITE NEGATIVE 06/16/2018 2222   LEUKOCYTESUR NEGATIVE 06/16/2018 2222   Sepsis Labs: @LABRCNTIP (procalcitonin:4,lacticidven:4)  ) Recent Results (from the past 240 hour(s))  Respiratory Panel by RT PCR (Flu A&B, Covid) - Nasopharyngeal Swab     Status: None   Collection  Time: 04/28/19  9:59 PM   Specimen: Nasopharyngeal Swab  Result Value Ref Range Status   SARS Coronavirus 2 by RT PCR NEGATIVE NEGATIVE Final    Comment: (NOTE) SARS-CoV-2 target nucleic acids are NOT DETECTED. The SARS-CoV-2 RNA is generally detectable in upper respiratoy specimens during the acute phase of infection. The lowest concentration of SARS-CoV-2 viral copies this assay can detect is 131 copies/mL. A negative result does not preclude SARS-Cov-2 infection and should not be used as the sole basis for treatment or other patient management decisions. A negative result may occur with  improper specimen collection/handling, submission of specimen other than nasopharyngeal swab, presence of viral mutation(s) within the areas targeted by this assay, and inadequate number of viral copies (<131 copies/mL). A negative result must be combined with clinical observations, patient history, and epidemiological information. The expected result is Negative. Fact Sheet for Patients:  06/26/19 Fact Sheet for Healthcare Providers:  https://www.moore.com/ This test is not yet ap proved or cleared by the https://www.young.biz/ FDA and  has been authorized for detection and/or diagnosis of SARS-CoV-2 by FDA under an Emergency Use Authorization (EUA). This EUA will remain  in effect (meaning this test can be used) for the duration of the COVID-19 declaration under Section 564(b)(1) of the Act, 21 U.S.C. section 360bbb-3(b)(1), unless the authorization is terminated or revoked sooner.    Influenza A by PCR NEGATIVE NEGATIVE Final   Influenza B by PCR NEGATIVE NEGATIVE Final    Comment: (NOTE) The Xpert Xpress SARS-CoV-2/FLU/RSV assay is intended as an aid in  the diagnosis of influenza from Nasopharyngeal swab specimens and  should not be used as a sole basis for treatment. Nasal washings and  aspirates are unacceptable for Xpert Xpress  SARS-CoV-2/FLU/RSV  testing. Fact Sheet for Patients: [place] Fact Sheet for Healthcare Providers: https://www.moore.com/ This test is not yet approved or cleared by the https://www.young.biz/ FDA and  has been authorized for detection and/or diagnosis of SARS-CoV-2 by  FDA under an Emergency Use Authorization (EUA). This EUA will remain  in effect (meaning this test can be  used) for the duration of the  Covid-19 declaration under Section 564(b)(1) of the Act, 21  U.S.C. section 360bbb-3(b)(1), unless the authorization is  terminated or revoked. Performed at [facility], [place] [address] [place], [place] [address]       Radiology Studies: CARDIAC CATHETERIZATION  Result Date: 04/30/2019  Prox RCA to Mid RCA lesion is 50% stenosed - In-stent re-stenosis of distal 1/2 of stented segment (by report 3 overlapping stents). The remainder of the stent segment is widely patent.  Prox LAD lesion is 40% stenosed with 50% stenosed side branch in 1st Diag.  Mid Cx lesion is 25% stenosed.  Hemodynamic findings consistent with severe pulmonary hypertension.  LV end diastolic pressure is severely elevated.  SUMMARY  Moderate in-stent restenosis of the RCA with moderate bifurcation LAD-diagonal disease but no obstructive disease.  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  Have written for 80 mg of Lasix x1 today and then 40 mg IV twice daily starting tomorrow.  If urine output does not pick up, may want to consider milrinone. Glenetta Hew, MD  ECHOCARDIOGRAM COMPLETE  Result Date: 04/29/2019   ECHOCARDIOGRAM REPORT    Patient Name:   Francisco [name] Date of Exam: 04/29/2019 Medical Rec #:  [id number]   Height:       72.0 in Accession #:    [id number]  Weight:       190.7 lb Date of Birth:  [birth year]   BSA:          2.09 m Patient Age:    73 years    BP:           156/112 mmHg Patient Gender: M           HR:           108 bpm. Exam Location:  Inpatient Procedure: 2D Echo, Cardiac Doppler and Color Doppler Indications:    CAD of Native Vessel 414.01/I25.10  History:        Patient has no prior history of Echocardiogram examinations.                 Previous Myocardial Infarction, COPD; Risk Factors:Hypertension.  Sonographer:    Jonelle Sidle Dance Referring Phys: [id number] Overlea  1. Left ventricular ejection fraction, by visual estimation, is 25 to 30%. The left ventricle has severely decreased function. There is mildly increased left ventricular hypertrophy.  2. Left ventricular diastolic parameters are consistent with Grade III diastolic dysfunction (restrictive).  3. The left ventricle demonstrates global hypokinesis.  4. Global right ventricle has normal systolic function.The right ventricular size is normal.  5. Left atrial size was mildly dilated.  6. Right atrial size was normal.  7. The mitral valve is normal in structure. Trivial mitral valve regurgitation. No evidence of mitral stenosis.  8. The tricuspid valve is normal in structure.  9. The aortic valve is tricuspid. Aortic valve regurgitation is not visualized. Mild aortic valve sclerosis without stenosis. 10. The pulmonic valve was normal in structure. Pulmonic valve regurgitation is not visualized. 11. The inferior vena cava is normal in size with greater than 50% respiratory variability, suggesting right atrial pressure of 3 mmHg. 12. Severe global reduction in LV systolic function; restrictive filling; mild  LVH; mild LAE. FINDINGS  Left Ventricle: Left ventricular ejection fraction, by visual estimation, is 25 to 30%. The left ventricle has severely  decreased function. The left ventricle demonstrates global hypokinesis. There is mildly increased left ventricular hypertrophy. Left ventricular diastolic parameters are consistent with Grade III diastolic dysfunction (restrictive). Normal left atrial pressure. Right Ventricle: The right ventricular size is normal.Global RV systolic function is has normal systolic function. Left Atrium: Left atrial size was mildly dilated. Right Atrium: Right atrial size was normal in size Pericardium: There is no evidence of pericardial effusion. Mitral Valve: The mitral valve is normal in structure. Trivial mitral valve regurgitation. No evidence of mitral valve stenosis by observation. Tricuspid Valve: The tricuspid valve is normal in structure. Tricuspid valve regurgitation is trivial. Aortic Valve: The aortic valve is tricuspid. Aortic valve regurgitation is not visualized. Mild aortic valve sclerosis is present, with no evidence of aortic valve stenosis. Pulmonic Valve: The pulmonic valve was normal in structure. Pulmonic valve regurgitation is not visualized. Pulmonic regurgitation is not visualized. Aorta: The aortic root is normal in size and structure. Venous: The inferior vena cava is normal in size with greater than 50% respiratory variability, suggesting right atrial pressure of 3 mmHg. IAS/Shunts: No atrial level shunt detected by color flow Doppler. Additional Comments: Severe global reduction in LV systolic function; restrictive filling; mild LVH; mild LAE.  LEFT VENTRICLE PLAX 2D LVIDd:         4.60 cm  Diastology LVIDs:         3.70 cm  LV e' lateral:   5.87 cm/s LV PW:         1.30 cm  LV E/e' lateral: 16.0 LV IVS:        1.00 cm  LV e' medial:    5.98 cm/s LVOT diam:     2.10 cm  LV E/e' medial:  15.7 LV SV:         39 ml LV SV Index:   18.62 LVOT Area:     3.46 cm  RIGHT VENTRICLE             IVC RV Basal diam:  3.00 cm     IVC diam: 2.10 cm RV Mid diam:    1.60 cm RV S prime:     10.20 cm/s TAPSE (M-mode): 1.7  cm LEFT ATRIUM             Index       RIGHT ATRIUM           Index LA diam:        3.30 cm 1.58 cm/m  RA Area:     17.00 cm LA Vol (A2C):   61.9 ml 29.62 ml/m RA Volume:   46.10 ml  22.08 ml/m LA Vol (A4C):   68.1 ml 32.62 ml/m LA Biplane Vol: 62.9 ml 30.13 ml/m  AORTIC VALVE LVOT Vmax:   71.10 cm/s LVOT Vmean:  38.900 cm/s LVOT VTI:    0.115 m  AORTA Ao Root diam: 3.60 cm Ao Asc diam:  2.40 cm MITRAL VALVE MV Area (PHT): 3.19 cm            SHUNTS MV PHT:        68.88 msec          Systemic VTI:  0.12 m MV Decel Time: 238 msec            Systemic Diam: 2.10 cm MV E velocity: 93.85 cm/s 103 cm/s  Olga Millers MD Electronically  signed by Olga Millers MD Signature Date/Time: 04/29/2019/12:17:09 PM    Final      Scheduled Meds: . aspirin EC  81 mg Oral Daily  . atorvastatin  80 mg Oral q1800  . carvedilol  12.5 mg Oral BID WC  . furosemide  40 mg Intravenous BID  . heparin  5,000 Units Subcutaneous Q8H  . lisinopril  10 mg Oral Daily  . Living Better with Heart Failure Book   Does not apply Once  . nicotine  14 mg Transdermal Daily  . sodium chloride flush  3 mL Intravenous Q12H  . sodium chloride flush  3 mL Intravenous Q12H  . spironolactone  12.5 mg Oral Daily   Continuous Infusions: . sodium chloride       LOS: 2 days   Time Spent in minutes   30 minutes  Maryuri Warnke D.O. on 05/01/2019 at 10:31 AM  Between 7am to 7pm - Please see pager noted on [email or web address]  After 7pm go to [email or web address]  And look for the night coverage person covering for me after hours  [facility] Office  [phone]

## 2019-05-01 NOTE — Telephone Encounter (Signed)
Will need TOC CALL ON 05/02/19

## 2019-05-01 NOTE — Telephone Encounter (Signed)
   Patient anticipated to be discharge tomorrow. Will need TOC call after discharge for CHF.  I have scheduled f/u appt 05/08/19 with Bettina Gavia (of note, Angie's schedule said she was supposed to be office AM/virtual PM that day but her template is open as virtual AM/office PM. The patient was put on for in-person appointment that day at 3:30pm. Angie has touched base with her scheduling team - per our discussion, she will f/u tomorrow and make changes if needed. So just FYI in case appt date/time changes.)  Ronie Spies PA-C

## 2019-05-01 NOTE — Progress Notes (Signed)
Right radial cath site without hematoma or ecchymosis; good pulse. Of note, patient reported that he drinks a lot of fluid all day long. Will place fluid modifier on diet.  Will consult dietitian for education new onset of CHF. Silver Achey PA-C

## 2019-05-01 NOTE — Progress Notes (Addendum)
Progress Note  Patient Name: Francisco [name] Date of Encounter: 05/01/2019  Primary Cardiologist: Peter [place], MD (new)  Subjective   Feels a world of difference better today. +++UOP yesterday. Breathing better. Feet no longer swollen.  Inpatient Medications    Scheduled Meds: . aspirin EC  81 mg Oral Daily  . atorvastatin  80 mg Oral q1800  . carvedilol  12.5 mg Oral BID WC  . furosemide  40 mg Intravenous BID  . heparin  5,000 Units Subcutaneous Q8H  . nicotine  14 mg Transdermal Daily  . nitroGLYCERIN  0.5 inch Topical Q6H  . sodium chloride flush  3 mL Intravenous Q12H  . sodium chloride flush  3 mL Intravenous Q12H   Continuous Infusions: . sodium chloride     PRN Meds: sodium chloride, acetaminophen **OR** acetaminophen, ipratropium-albuterol, morphine injection, nitroGLYCERIN, sodium chloride flush   Vital Signs    Vitals:   04/30/19 1053 04/30/19 1733 04/30/19 2008 05/01/19 0533  BP: (!) 134/95 (!) 146/86 133/73 (!) 177/120  Pulse: 93 97 90 96  Resp: 16  14 12   Temp:   98.3 F (36.8 C) 97.6 F (36.4 C)  TempSrc:   Oral Oral  SpO2: 99% 99% 96% 95%  Weight:    83.9 kg  Height:        Intake/Output Summary (Last 24 hours) at 05/01/2019 0842 Last data filed at 05/01/2019 0545 Gross per 24 hour  Intake 1793 ml  Output 3775 ml  Net -1982 ml   Last 3 Weights 05/01/2019 04/29/2019 04/25/2019  Weight (lbs) 184 lb 14.4 oz 190 lb 11.2 oz 180 lb  Weight (kg) 83.87 kg 86.501 kg 81.647 kg     Telemetry    NSR, brief run of narrow complex tachycardia 04/30/19 815am - Personally Reviewed  Physical Exam   GEN: No acute distress.  HEENT: Normocephalic, atraumatic, sclera non-icteric. Neck: No JVD or bruits. Cardiac: RRR no murmurs, rubs, or gallops.  Radials/DP/PT 1+ and equal bilaterally.  Respiratory: Mildly diminished throughout without wheezes or rales bilaterally. Breathing is unlabored. GI: Soft, nontender, non-distended, BS +x 4. MS: no  deformity. Extremities: No clubbing or cyanosis. No edema. Distal pedal pulses are 2+ and equal bilaterally. Neuro:  AAOx3. Follows commands. Psych:  Responds to questions appropriately with a normal affect.  Labs    High Sensitivity Troponin:   Recent Labs  Lab 04/28/19 2014 04/28/19 2153 04/29/19 0023 04/29/19 0303  TROPONINIHS 43* 46* 45* 37*      Cardiac EnzymesNo results for input(s): TROPONINI in the last 168 hours. No results for input(s): TROPIPOC in the last 168 hours.   Chemistry Recent Labs  Lab 04/28/19 2014 04/30/19 0300 04/30/19 1031 04/30/19 1035 05/01/19 0429  NA 137 141 141 139 138  K 4.3 4.9 4.9 5.3* 4.4  CL 101 104  --   --  103  CO2 25 29  --   --  26  GLUCOSE 109* 119*  --   --  119*  BUN 9 26*  --   --  22*  CREATININE 1.02 1.14  --   --  1.01  CALCIUM 9.0 9.2  --   --  9.0  GFRNONAA >60 >60  --   --  >60  GFRAA >60 >60  --   --  >60  ANIONGAP 11 8  --   --  9     Hematology Recent Labs  Lab 04/29/19 0303 04/30/19 0300 04/30/19 1031 04/30/19 1035 05/01/19 0429  WBC 14.3*  18.2*  --   --  15.3*  RBC 5.00 4.58  --   --  4.54  HGB 16.2 15.1 15.0 15.3 14.8  HCT 49.3 45.8 44.0 45.0 44.9  MCV 98.6 100.0  --   --  98.9  MCH 32.4 33.0  --   --  32.6  MCHC 32.9 33.0  --   --  33.0  RDW 14.1 14.4  --   --  14.2  PLT 250 243  --   --  249    BNP Recent Labs  Lab 04/29/19 0303  BNP 682.1*     DDimer  Recent Labs  Lab 04/28/19 2128  DDIMER <0.27     Radiology    CARDIAC CATHETERIZATION  Result Date: 04/30/2019  Prox RCA to Mid RCA lesion is 50% stenosed - In-stent re-stenosis of distal 1/2 of stented segment (by report 3 overlapping stents). The remainder of the stent segment is widely patent.  Prox LAD lesion is 40% stenosed with 50% stenosed side branch in 1st Diag.  Mid Cx lesion is 25% stenosed.  Hemodynamic findings consistent with severe pulmonary hypertension.  LV end diastolic pressure is severely elevated.  SUMMARY   Moderate in-stent restenosis of the RCA with moderate bifurcation LAD-diagonal disease but no obstructive disease.  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  Have written for 80 mg of Lasix x1 today and then 40 mg IV twice daily starting tomorrow.  If urine output does not pick up, may want to consider milrinone. Glenetta Hew, MD  ECHOCARDIOGRAM COMPLETE  Result Date: 04/29/2019   ECHOCARDIOGRAM REPORT   Patient Name:   Francisco [name] Date of Exam: 04/29/2019 Medical Rec #:  [id number]   Height:       72.0 in Accession #:    [id number]  Weight:       190.7 lb Date of Birth:  [birth year]   BSA:          2.09 m Patient Age:    59 years    BP:           156/112 mmHg Patient Gender: M           HR:           108 bpm. Exam Location:  Inpatient Procedure: 2D Echo, Cardiac Doppler and Color Doppler Indications:    CAD of Native Vessel 414.01/I25.10  History:        Patient has no prior history of Echocardiogram examinations.                 Previous Myocardial Infarction, COPD; Risk Factors:Hypertension.  Sonographer:    Jonelle Sidle Dance Referring Phys: [id number] [place]  1. Left ventricular ejection fraction, by visual estimation, is 25 to 30%. The left ventricle has severely decreased function. There is mildly increased left ventricular hypertrophy.  2. Left ventricular diastolic parameters are consistent with Grade III diastolic dysfunction (restrictive).  3. The left ventricle demonstrates global hypokinesis.  4. Global right ventricle has normal systolic function.The right ventricular size is normal.  5. Left atrial size was mildly dilated.  6. Right atrial size was normal.   7. The mitral valve is normal in structure. Trivial mitral valve regurgitation. No evidence of mitral  stenosis.  8. The tricuspid valve is normal in structure.  9. The aortic valve is tricuspid. Aortic valve regurgitation is not visualized. Mild aortic valve sclerosis without stenosis. 10. The pulmonic valve was normal in structure. Pulmonic valve regurgitation is not visualized. 11. The inferior vena cava is normal in size with greater than 50% respiratory variability, suggesting right atrial pressure of 3 mmHg. 12. Severe global reduction in LV systolic function; restrictive filling; mild LVH; mild LAE. FINDINGS  Left Ventricle: Left ventricular ejection fraction, by visual estimation, is 25 to 30%. The left ventricle has severely decreased function. The left ventricle demonstrates global hypokinesis. There is mildly increased left ventricular hypertrophy. Left ventricular diastolic parameters are consistent with Grade III diastolic dysfunction (restrictive). Normal left atrial pressure. Right Ventricle: The right ventricular size is normal.Global RV systolic function is has normal systolic function. Left Atrium: Left atrial size was mildly dilated. Right Atrium: Right atrial size was normal in size Pericardium: There is no evidence of pericardial effusion. Mitral Valve: The mitral valve is normal in structure. Trivial mitral valve regurgitation. No evidence of mitral valve stenosis by observation. Tricuspid Valve: The tricuspid valve is normal in structure. Tricuspid valve regurgitation is trivial. Aortic Valve: The aortic valve is tricuspid. Aortic valve regurgitation is not visualized. Mild aortic valve sclerosis is present, with no evidence of aortic valve stenosis. Pulmonic Valve: The pulmonic valve was normal in structure. Pulmonic valve regurgitation is not visualized. Pulmonic regurgitation is not visualized. Aorta: The aortic root is normal in size and structure. Venous: The inferior vena cava is normal  in size with greater than 50% respiratory variability, suggesting right atrial pressure of 3 mmHg. IAS/Shunts: No atrial level shunt detected by color flow Doppler. Additional Comments: Severe global reduction in LV systolic function; restrictive filling; mild LVH; mild LAE.  LEFT VENTRICLE PLAX 2D LVIDd:         4.60 cm  Diastology LVIDs:         3.70 cm  LV e' lateral:   5.87 cm/s LV PW:         1.30 cm  LV E/e' lateral: 16.0 LV IVS:        1.00 cm  LV e' medial:    5.98 cm/s LVOT diam:     2.10 cm  LV E/e' medial:  15.7 LV SV:         39 ml LV SV Index:   18.62 LVOT Area:     3.46 cm  RIGHT VENTRICLE             IVC RV Basal diam:  3.00 cm     IVC diam: 2.10 cm RV Mid diam:    1.60 cm RV S prime:     10.20 cm/s TAPSE (M-mode): 1.7 cm LEFT ATRIUM             Index       RIGHT ATRIUM           Index LA diam:        3.30 cm 1.58 cm/m  RA Area:     17.00 cm LA Vol (A2C):   61.9 ml 29.62 ml/m RA Volume:   46.10 ml  22.08 ml/m LA Vol (A4C):   68.1 ml 32.62 ml/m LA Biplane Vol: 62.9 ml 30.13 ml/m  AORTIC VALVE LVOT Vmax:   71.10 cm/s LVOT Vmean:  38.900 cm/s LVOT VTI:    0.115 m  AORTA Ao Root diam: 3.60 cm Ao Asc diam:  2.40 cm MITRAL  VALVE MV Area (PHT): 3.19 cm            SHUNTS MV PHT:        68.88 msec          Systemic VTI:  0.12 m MV Decel Time: 238 msec            Systemic Diam: 2.10 cm MV E velocity: 93.85 cm/s 103 cm/s  Olga Millers MD Electronically signed by Olga Millers MD Signature Date/Time: 04/29/2019/12:17:09 PM    Final     Cardiac Studies   2D Echo 04/29/19 and Cardiac Cath 04/30/19- pulls in above  Patient Profile     59 y.o. male with history of COPD, CAD s/p PCI (reports 3 stents 5 yrs prior, in [place] then [place]), medication non-compliance, Hep C (not treated per notes), IVDU (previously heroin, now meth), tobacco use who was seen for evaluation of chest pressure and abnormal echocardiogram with LV dysfunction.  Assessment & Plan    1. Chest pain/dyspnea likely due  to acute combined CHF, primarily NICM - EF 25-30% - cath yesterday with moderate ISR of RCA and moderate bifurcation LAD-diagonal disease but no obstructive disease, suspected NICM. Received 80mg  IV Lasix yesterday and patient reports significant improvement. Would continue IV Lasix 40mg  BID through today since only got one dose yesterday so far. Continue carvedilol. He does not appear to have received lisinopril yesterday. I will review plans for ACEI/ARB/ARNI with Dr. - note no insurance, so cost has been a concern for patient. Spironolactone would be another consideration although K has been upper limits prior to Lasix. He remains on NTG paste. Reviewed 2g sodium restriction, 2L fluid restriction, daily weights with patient. Will rx CHF book.  2. SVT - per tele 04/30/19 at 8:15am. Slightly irregular although P wave activity was noted interspersed in rhythm. ? Atrial tach. No further episodes since. Continue beta blocker. Add TSH to AM labs.  3. CAD - continue ASA, BB, see below regarding statin. Elevated troponin likely due to demand process given cath findings above. Continue risk factor modification.  4. HTN - remains suboptimally controlled. Will review med regimen with MD.  5. Drug use - counseled on cessation.  6. Hyperlipidemia - LDL 111. Started on atorvastatin. Of note, patient had elevated transaminases in 10/2018. Recheck today, may need to decrease statin dose if still elevated.  For questions or updates, please contact [facility] Please consult [email or web address] for contact info under Cardiology/STEMI.  Signed, 06/28/19, PA-C 05/01/2019, 8:42 AM

## 2019-05-02 DIAGNOSIS — J449 Chronic obstructive pulmonary disease, unspecified: Secondary | ICD-10-CM

## 2019-05-02 LAB — BASIC METABOLIC PANEL
Anion gap: 10 (ref 5–15)
BUN: 26 mg/dL — ABNORMAL HIGH (ref 6–20)
CO2: 30 mmol/L (ref 22–32)
Calcium: 9.1 mg/dL (ref 8.9–10.3)
Chloride: 100 mmol/L (ref 98–111)
Creatinine, Ser: 1.13 mg/dL (ref 0.61–1.24)
GFR calc Af Amer: >60 mL/min (ref >60)
GFR calc non Af Amer: >60 mL/min (ref >60)
Glucose, Bld: 100 mg/dL — ABNORMAL HIGH (ref 70–99)
Potassium: 4 mmol/L (ref 3.5–5.1)
Sodium: 140 mmol/L (ref 135–145)

## 2019-05-02 LAB — CBC
HCT: 49.7 % (ref 39.0–52.0)
Hemoglobin: 16.5 g/dL (ref 13.0–17.0)
MCH: 32.7 pg (ref 26.0–34.0)
MCHC: 33.2 g/dL (ref 30.0–36.0)
MCV: 98.4 fL (ref 80.0–100.0)
Platelets: 238 10*3/uL (ref 150–400)
RBC: 5.05 MIL/uL (ref 4.22–5.81)
RDW: 14 % (ref 11.5–15.5)
WBC: 11.2 10*3/uL — ABNORMAL HIGH (ref 4.0–10.5)
nRBC: 0 % (ref 0.0–0.2)

## 2019-05-02 LAB — TSH: TSH: 2.758 u[IU]/mL (ref 0.350–4.500)

## 2019-05-02 MED ORDER — CARVEDILOL 25 MG PO TABS: 25.0000 mg | ORAL_TABLET | Freq: Two times a day (BID) | ORAL | 11 refills | Status: DC

## 2019-05-02 MED ORDER — NITROGLYCERIN 0.4 MG SL SUBL: 0.4000 mg | SUBLINGUAL_TABLET | SUBLINGUAL | 3 refills | Status: DC | PRN

## 2019-05-02 MED ORDER — SPIRONOLACTONE 25 MG PO TABS: 12.5000 mg | ORAL_TABLET | Freq: Every day | ORAL | 11 refills | Status: DC

## 2019-05-02 MED ORDER — CELECOXIB 200 MG PO CAPS: 200.0000 mg | ORAL_CAPSULE | Freq: Two times a day (BID) | ORAL | 0 refills | Status: DC

## 2019-05-02 MED ORDER — ATORVASTATIN CALCIUM 80 MG PO TABS: 80.0000 mg | ORAL_TABLET | Freq: Every day | ORAL | 11 refills | Status: DC

## 2019-05-02 MED ORDER — ISOSORBIDE MONONITRATE ER 30 MG PO TB24: 30.0000 mg | ORAL_TABLET | Freq: Every day | ORAL | Status: DC

## 2019-05-02 MED ORDER — LISINOPRIL 20 MG PO TABS: 20.0000 mg | ORAL_TABLET | Freq: Every day | ORAL | 11 refills | Status: DC

## 2019-05-02 MED ORDER — FUROSEMIDE 40 MG PO TABS: 40.0000 mg | ORAL_TABLET | Freq: Every day | ORAL | 11 refills | Status: DC

## 2019-05-02 MED FILL — CARVEDILOL 25 MG TABLET: 25 | 30 days supply | Qty: 60 | Fill #0

## 2019-05-02 MED FILL — LISINOPRIL 20 MG TABLET: 20 | 30 days supply | Qty: 30 | Fill #0

## 2019-05-02 MED FILL — NITROGLYCERIN 0.4 MG TAB SL: 0.4 | 8 days supply | Qty: 25 | Fill #0

## 2019-05-02 MED FILL — FUROSEMIDE 40 MG TABLET: 40 | 30 days supply | Qty: 30 | Fill #0

## 2019-05-02 MED FILL — SPIRONOLACTONE 25 MG TABLET: 25 | 30 days supply | Qty: 15 | Fill #0

## 2019-05-02 MED FILL — CELECOXIB 200 MG CAPSULE: 200 | 15 days supply | Qty: 30 | Fill #0

## 2019-05-02 MED FILL — ATORVASTATIN CALCIUM 80 MG: 80 | 30 days supply | Qty: 30 | Fill #0

## 2019-05-02 NOTE — Telephone Encounter (Signed)
LM2CB for TOC

## 2019-05-02 NOTE — Discharge Instructions (Signed)

## 2019-05-02 NOTE — Discharge Summary (Signed)
Discharge Summary    Patient ID: Francisco [name] MRN: [id number]; DOB: [birth year]  Admit date: 04/28/2019 Discharge date: 05/02/2019  Primary Care Provider: Patient, No Pcp Per  Primary Cardiologist: Peter Martinique, MD  Primary Electrophysiologist:  Cristopher Peru, MD   Discharge Diagnoses    Principal Problem:   Acute combined systolic and diastolic heart failure [facility]) Active Problems:   ACS (acute coronary syndrome) (HCC)   Chronic obstructive pulmonary disease (HCC)   Elevated troponin   Unstable angina ([place])   Dilated cardiomyopathy ([place])   CAD S/P percutaneous coronary angioplasty   Allergies Allergies  Allergen Reactions  . Ivp Dye [Iodinated Diagnostic Agents] Other (See Comments)    seizures  . Tramadol Other (See Comments)    seizures    Diagnostic Studies/Procedures    RIGHT/LEFT HEART CATH: 04/30/2019 ECHO: 04/29/2019 Results below _____________   History of Present Illness     Francisco [name] is a 59 y.o. male with history of COPD, CAD s/p PCI (reports 3 stents 5 yrs prior, in [place] then [place]), medication non-compliance, Hep C(not treated per notes), IVDU (previously heroin, now meth),tobacco usewho was admitted 04/28/2019 evaluation ofchest pressure and abnormal echocardiogram with LV dysfunction.  Hospital Course     Consultants: EP, Dr Lovena Le   Francisco [name] was seen in the ER on 04/16/2019, 04/25/2019 and then came in on 04/28/2019.  All visits were for chest pain and shortness of breath.  An echocardiogram was performed on 1/10.  It showed an EF of 25-30% and grade 3 diastolic dysfunction.  He was started on a beta-blocker, spironolactone and lisinopril.  For cost savings and to keep his medication regimen as simple as possible, we will not add additional drugs at this time.  Hopefully, he can see the pharmacist when he comes in for his follow-up visit to assess for [facility] assistance.  He was volume overloaded on exam.  He got Lasix 80 mg IV on 1/11,  then was on 40 mg IV BID.  He diuresed 6 pounds and his respiratory status remarkably improved.  He will be on Lasix 40 mg daily at home.  He had some elevation in his cardiac enzymes, and there was concern for acute coronary syndrome.  He was taken to the Cath Lab cardiac catheterization results are below.  He had moderate, but nonobstructive disease in multiple vessels.  Medical therapy was recommended.  He is on aspirin, beta-blocker and statin.  By 05/02/2019, he was ambulating without chest pain or shortness of breath.  No further inpatient work-up was indicated and he is considered stable for discharge, to follow-up as an outpatient.   Did the patient have an acute coronary syndrome (MI, NSTEMI, STEMI, etc) this admission?:  No.   The elevated Troponin was due to the acute medical illness (demand ischemia).  _____________  Discharge Vitals Blood pressure (!) 177/101, pulse 81, temperature 97.9 F (36.6 C), temperature source Oral, resp. rate 14, height 6' (1.829 m), weight 83 kg, SpO2 97 %.  Filed Weights   04/29/19 0207 05/01/19 0533 05/02/19 0300  Weight: 86.5 kg 83.9 kg 83 kg    Labs & Radiologic Studies    CBC Recent Labs    05/01/19 0429 05/02/19 0359  WBC 15.3* 11.2*  HGB 14.8 16.5  HCT 44.9 49.7  MCV 98.9 98.4  PLT 249 789   Basic Metabolic Panel Recent Labs    05/01/19 0429 05/02/19 0359  NA 138 140  K 4.4 4.0  CL 103 100  CO2 26 30  GLUCOSE 119* 100*  BUN 22* 26*  CREATININE 1.01 1.13  CALCIUM 9.0 9.1   Liver Function Tests Recent Labs    05/01/19 0429  AST 28  ALT 55*  ALKPHOS 49  BILITOT 0.8  PROT 6.1*  ALBUMIN 3.2*    High Sensitivity Troponin:   Recent Labs  Lab 04/28/19 2014 04/28/19 2153 04/29/19 0023 04/29/19 0303  TROPONINIHS 43* 46* 45* 37*     BNP    Component Value Date/Time   BNP 682.1 (H) 04/29/2019 0303   Hemoglobin A1C Lab Results  Component Value Date   HGBA1C 5.5 04/29/2019   Fasting Lipid Panel Lab Results    Component Value Date   CHOL 215 (H) 04/29/2019   HDL 93 04/29/2019   LDLCALC 111 (H) 04/29/2019   TRIG 57 04/29/2019   CHOLHDL 2.3 04/29/2019   Thyroid Function Tests Recent Labs    05/02/19 0359  TSH 2.758   _____________  DG Chest 2 View  Result Date: 04/28/2019 CLINICAL DATA:  Chest pain, shortness of breath EXAM: CHEST - 2 VIEW COMPARISON:  04/25/2019 FINDINGS: Heart and mediastinal contours are within normal limits. No focal opacities or effusions. No acute bony abnormality. IMPRESSION: No active cardiopulmonary disease. Electronically Signed   By: Charlett Nose M.D.   On: 04/28/2019 20:24   cxr  Result Date: 04/25/2019 CLINICAL DATA:  Shortness of breath. COPD. EXAM: CHEST - 2 VIEW COMPARISON:  Chest x-ray dated 04/16/2019 and CT scan of the chest dated 06/16/2018 FINDINGS: The heart size and mediastinal contours are within normal limits. Both lungs are clear. The visualized skeletal structures are unremarkable. Coronary artery stent in place. IMPRESSION: No active cardiopulmonary disease. Electronically Signed   By: Francene Boyers M.D.   On: 04/25/2019 09:33   DG Chest 2 View  Result Date: 04/16/2019 CLINICAL DATA:  Shortness of breath EXAM: CHEST - 2 VIEW COMPARISON:  11/04/2018 FINDINGS: Cardiac shadow is stable. The lungs are hyperinflated similar to that noted on the prior exam. No focal infiltrate or sizable effusion is seen. Mild stable wedging is noted in the midthoracic spine. IMPRESSION: COPD without acute abnormality. Electronically Signed   By: Alcide Clever M.D.   On: 04/16/2019 11:45   CARDIAC CATHETERIZATION  Result Date: 04/30/2019  Prox RCA to Mid RCA lesion is 50% stenosed - In-stent re-stenosis of distal 1/2 of stented segment (by report 3 overlapping stents). The remainder of the stent segment is widely patent.  Prox LAD lesion is 40% stenosed with 50% stenosed side branch in 1st Diag.  Mid Cx lesion is 25% stenosed.  Hemodynamic findings consistent with  severe pulmonary hypertension.  LV end diastolic pressure is severely elevated.  SUMMARY  Moderate in-stent restenosis of the RCA with moderate bifurcation LAD-diagonal disease but no obstructive disease.  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  Have written for 80 mg of Lasix x1 today and then 40 mg IV twice daily starting tomorrow.  If urine output does not pick up, may want to consider milrinone. [name], MD  ECHOCARDIOGRAM COMPLETE  Result Date: 04/29/2019   ECHOCARDIOGRAM REPORT   Patient Name:   Francisco [name] Date of Exam: 04/29/2019 Medical Rec #:  [id number]   Height:  72.0 in Accession #:    [id number]  Weight:       190.7 lb Date of Birth:  05-05-1960   BSA:          2.09 m Patient Age:    58 years    BP:           156/112 mmHg Patient Gender: M           HR:           108 bpm. Exam Location:  Inpatient Procedure: 2D Echo, Cardiac Doppler and Color Doppler Indications:    CAD of Native Vessel 414.01/I25.10  History:        Patient has no prior history of Echocardiogram examinations.                 Previous Myocardial Infarction, COPD; Risk Factors:Hypertension.  Sonographer:    Elmarie Shiley Dance Referring Phys: [id number] ARAVIND CHANDRA IMPRESSIONS  1. Left ventricular ejection fraction, by visual estimation, is 25 to 30%. The left ventricle has severely decreased function. There is mildly increased left ventricular hypertrophy.  2. Left ventricular diastolic parameters are consistent with Grade III diastolic dysfunction (restrictive).  3. The left ventricle demonstrates global hypokinesis.  4. Global right ventricle has normal systolic function.The right ventricular  size is normal.  5. Left atrial size was mildly dilated.  6. Right atrial size was normal.  7. The mitral valve is normal in structure. Trivial mitral valve regurgitation. No evidence of mitral stenosis.  8. The tricuspid valve is normal in structure.  9. The aortic valve is tricuspid. Aortic valve regurgitation is not visualized. Mild aortic valve sclerosis without stenosis. 10. The pulmonic valve was normal in structure. Pulmonic valve regurgitation is not visualized. 11. The inferior vena cava is normal in size with greater than 50% respiratory variability, suggesting right atrial pressure of 3 mmHg. 12. Severe global reduction in LV systolic function; restrictive filling; mild LVH; mild LAE. FINDINGS  Left Ventricle: Left ventricular ejection fraction, by visual estimation, is 25 to 30%. The left ventricle has severely decreased function. The left ventricle demonstrates global hypokinesis. There is mildly increased left ventricular hypertrophy. Left ventricular diastolic parameters are consistent with Grade III diastolic dysfunction (restrictive). Normal left atrial pressure. Right Ventricle: The right ventricular size is normal.Global RV systolic function is has normal systolic function. Left Atrium: Left atrial size was mildly dilated. Right Atrium: Right atrial size was normal in size Pericardium: There is no evidence of pericardial effusion. Mitral Valve: The mitral valve is normal in structure. Trivial mitral valve regurgitation. No evidence of mitral valve stenosis by observation. Tricuspid Valve: The tricuspid valve is normal in structure. Tricuspid valve regurgitation is trivial. Aortic Valve: The aortic valve is tricuspid. Aortic valve regurgitation is not visualized. Mild aortic valve sclerosis is present, with no evidence of aortic valve stenosis. Pulmonic Valve: The pulmonic valve was normal in structure. Pulmonic valve regurgitation is not visualized. Pulmonic regurgitation is not visualized. Aorta:  The aortic root is normal in size and structure. Venous: The inferior vena cava is normal in size with greater than 50% respiratory variability, suggesting right atrial pressure of 3 mmHg. IAS/Shunts: No atrial level shunt detected by color flow Doppler. Additional Comments: Severe global reduction in LV systolic function; restrictive filling; mild LVH; mild LAE.  LEFT VENTRICLE PLAX 2D LVIDd:         4.60 cm  Diastology LVIDs:         3.70 cm  LV e' lateral:  5.87 cm/s LV PW:         1.30 cm  LV E/e' lateral: 16.0 LV IVS:        1.00 cm  LV e' medial:    5.98 cm/s LVOT diam:     2.10 cm  LV E/e' medial:  15.7 LV SV:         39 ml LV SV Index:   18.62 LVOT Area:     3.46 cm  RIGHT VENTRICLE             IVC RV Basal diam:  3.00 cm     IVC diam: 2.10 cm RV Mid diam:    1.60 cm RV S prime:     10.20 cm/s TAPSE (M-mode): 1.7 cm LEFT ATRIUM             Index       RIGHT ATRIUM           Index LA diam:        3.30 cm 1.58 cm/m  RA Area:     17.00 cm LA Vol (A2C):   61.9 ml 29.62 ml/m RA Volume:   46.10 ml  22.08 ml/m LA Vol (A4C):   68.1 ml 32.62 ml/m LA Biplane Vol: 62.9 ml 30.13 ml/m  AORTIC VALVE LVOT Vmax:   71.10 cm/s LVOT Vmean:  38.900 cm/s LVOT VTI:    0.115 m  AORTA Ao Root diam: 3.60 cm Ao Asc diam:  2.40 cm MITRAL VALVE MV Area (PHT): 3.19 cm            SHUNTS MV PHT:        68.88 msec          Systemic VTI:  0.12 m MV Decel Time: 238 msec            Systemic Diam: 2.10 cm MV E velocity: 93.85 cm/s 103 cm/s  Olga Millers MD Electronically signed by Olga Millers MD Signature Date/Time: 04/29/2019/12:17:09 PM    Final    Disposition   Pt is being discharged home today in improved condition.  Follow-up Plans & Appointments    Follow-up Information    [facility]. Go on 05/23/2019.   Why: at 9:30am for hospital followup Contact information: [address] [place] [address] [phone]       Marcelino Duster, PA Follow up.    Specialties: Physician Assistant, Cardiology, Radiology Why: [facility] location - see appointment information below for 05/08/19 at 3:30pm. Please arrive 15 minutes early to check in. Angie is one of the PAs that works closely with our cardiology team. Bring all your medicines. Contact information: [address] [place] [address] [phone]            Discharge Medications   Allergies as of 05/02/2019      Reactions   Ivp Dye [iodinated Diagnostic Agents] Other (See Comments)   seizures   Tramadol Other (See Comments)   seizures      Medication List    STOP taking these medications   amLODipine 10 MG tablet Commonly known as: NORVASC   ibuprofen 200 MG tablet Commonly known as: ADVIL   predniSONE 20 MG tablet Commonly known as: DELTASONE     TAKE these medications   albuterol 108 (90 Base) MCG/ACT inhaler Commonly known as: VENTOLIN HFA Inhale 1-2 puffs into the lungs every 6 (six) hours as needed for wheezing or shortness of breath.  aspirin EC 81 MG tablet Take 81 mg by mouth daily.   atorvastatin 80 MG tablet Commonly known as: LIPITOR Take 1 tablet (80 mg total) by mouth daily at 6 PM.   carvedilol 25 MG tablet Commonly known as: COREG Take 1 tablet (25 mg total) by mouth 2 (two) times daily with a meal.   celecoxib 200 MG capsule Commonly known as: CELEBREX Take 1 capsule (200 mg total) by mouth 2 (two) times daily.   furosemide 40 MG tablet Commonly known as: Lasix Take 1 tablet (40 mg total) by mouth daily.   ipratropium-albuterol 0.5-2.5 (3) MG/3ML Soln Commonly known as: DUONEB Take 3 mLs by nebulization 3 (three) times daily.   lisinopril 20 MG tablet Commonly known as: ZESTRIL Take 1 tablet (20 mg total) by mouth daily. Start taking on: May 03, 2019   mometasone-formoterol 200-5 MCG/ACT Aero Commonly known as: DULERA Inhale 2 puffs into the lungs 2 (two) times daily.   nicotine 14 mg/24hr  patch Commonly known as: NICODERM CQ - dosed in mg/24 hours Place 1 patch (14 mg total) onto the skin daily.   nitroGLYCERIN 0.4 MG SL tablet Commonly known as: NITROSTAT Place 1 tablet (0.4 mg total) under the tongue every 5 (five) minutes x 3 doses as needed for chest pain.   spironolactone 25 MG tablet Commonly known as: ALDACTONE Take 0.5 tablets (12.5 mg total) by mouth daily. Start taking on: May 03, 2019          Outstanding Labs/Studies   None  Duration of Discharge Encounter   Greater than 30 minutes including physician time.  Signed, Theodore Demark, PA-C 05/02/2019, 10:09 AM

## 2019-05-02 NOTE — Progress Notes (Addendum)
Progress Note  Patient Name: Francisco [name] Date of Encounter: 05/02/2019  Primary Cardiologist: Peter Swaz[place], MD (new)  Subjective   Feels great, wants d/c. Does his own shopping and cooking, willing to get low Na foods. Has transport to MD appts. Rx $ an issue.  Inpatient Medications    Scheduled Meds: . aspirin EC  81 mg Oral Daily  . atorvastatin  80 mg Oral q1800  . carvedilol  12.5 mg Oral BID WC  . celecoxib  200 mg Oral BID  . furosemide  40 mg Intravenous BID  . heparin  5,000 Units Subcutaneous Q8H  . lisinopril  10 mg Oral Daily  . Living Better with Heart Failure Book   Does not apply Once  . nicotine  14 mg Transdermal Daily  . sodium chloride flush  3 mL Intravenous Q12H  . sodium chloride flush  3 mL Intravenous Q12H  . spironolactone  12.5 mg Oral Daily   Continuous Infusions: . sodium chloride     PRN Meds: sodium chloride, acetaminophen **OR** acetaminophen, ipratropium-albuterol, morphine injection, nitroGLYCERIN, sodium chloride flush   Vital Signs    Vitals:   05/01/19 1439 05/01/19 1440 05/01/19 2137 05/02/19 0300  BP: 132/81 132/81 135/66 (!) 177/101  Pulse: 78 78 83 81  Resp:    14  Temp: 98 F (36.7 C)  97.8 F (36.6 C) 97.9 F (36.6 C)  TempSrc: Oral  Oral Oral  SpO2:  96% 98% 97%  Weight:    83 kg  Height:        Intake/Output Summary (Last 24 hours) at 05/02/2019 4098 Last data filed at 05/02/2019 0409 Gross per 24 hour  Intake 360 ml  Output 2150 ml  Net -1790 ml   Last 3 Weights 05/02/2019 05/01/2019 04/29/2019  Weight (lbs) 183 lb 184 lb 14.4 oz 190 lb 11.2 oz  Weight (kg) 83.008 kg 83.87 kg 86.501 kg     Telemetry    SR, SVT 6 bts - Personally Reviewed  Physical Exam   General: Well developed, well nourished, male in no acute distress Head: Eyes PERRLA, Head normocephalic and atraumatic Lungs: clear bilaterally to auscultation. Heart: HRRR S1 S2, without rub or gallop. No murmur. 4/4 extremity pulses are 2+ & equal. No  JVD. Abdomen: Bowel sounds are present, abdomen soft and non-tender without masses or  hernias noted. Msk: Normal strength and tone for age. Extremities: No clubbing, cyanosis or edema.    Skin:  No rashes or lesions noted. Neuro: Alert and oriented X 3. Psych:  Good affect, responds appropriately  Labs    High Sensitivity Troponin:   Recent Labs  Lab 04/28/19 2014 04/28/19 2153 04/29/19 0023 04/29/19 0303  TROPONINIHS 43* 46* 45* 37*      Cardiac EnzymesNo results for input(s): TROPONINI in the last 168 hours. No results for input(s): TROPIPOC in the last 168 hours.   Chemistry Recent Labs  Lab 04/30/19 0300 04/30/19 1035 05/01/19 0429 05/02/19 0359  NA 141 139 138 140  K 4.9 5.3* 4.4 4.0  CL 104  --  103 100  CO2 29  --  26 30  GLUCOSE 119*  --  119* 100*  BUN 26*  --  22* 26*  CREATININE 1.14  --  1.01 1.13  CALCIUM 9.2  --  9.0 9.1  PROT  --   --  6.1*  --   ALBUMIN  --   --  3.2*  --   AST  --   --  28  --   ALT  --   --  55*  --   ALKPHOS  --   --  49  --   BILITOT  --   --  0.8  --   GFRNONAA >60  --  >60 >60  GFRAA >60  --  >60 >60  ANIONGAP 8  --  9 10     Hematology Recent Labs  Lab 04/30/19 0300 04/30/19 1035 05/01/19 0429 05/02/19 0359  WBC 18.2*  --  15.3* 11.2*  RBC 4.58  --  4.54 5.05  HGB 15.1 15.3 14.8 16.5  HCT 45.8 45.0 44.9 49.7  MCV 100.0  --  98.9 98.4  MCH 33.0  --  32.6 32.7  MCHC 33.0  --  33.0 33.2  RDW 14.4  --  14.2 14.0  PLT 243  --  249 238    BNP Recent Labs  Lab 04/29/19 0303  BNP 682.1*     DDimer  Recent Labs  Lab 04/28/19 2128  DDIMER <0.27    Lab Results  Component Value Date   TSH 2.758 05/02/2019     Radiology    CARDIAC CATHETERIZATION  Result Date: 04/30/2019  Prox RCA to Mid RCA lesion is 50% stenosed - In-stent re-stenosis of distal 1/2 of stented segment (by report 3 overlapping stents). The remainder of the stent segment is widely patent.  Prox LAD lesion is 40% stenosed with 50%  stenosed side branch in 1st Diag.  Mid Cx lesion is 25% stenosed.  Hemodynamic findings consistent with severe pulmonary hypertension.  LV end diastolic pressure is severely elevated.  SUMMARY  Moderate in-stent restenosis of the RCA with moderate bifurcation LAD-diagonal disease but no obstructive disease.  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  Have written for 80 mg of Lasix x1 today and then 40 mg IV twice daily starting tomorrow.  If urine output does not pick up, may want to consider milrinone. Glenetta Hew, MD   Cardiac Studies   2D Echo 04/29/19 and Cardiac Cath 04/30/19- pulls in above  Patient Profile     59 y.o. male with history of COPD, CAD s/p PCI (reports 3 stents 5 yrs prior, in [place] then [place]), medication non-compliance, Hep C (not treated per notes), IVDU (previously heroin, now meth), tobacco use who was seen for evaluation of chest pressure and abnormal echocardiogram with LV dysfunction.  Assessment & Plan    1. Chest pain/dyspnea likely due to acute combined CHF, primarily NICM - - EF 25-30%, cath results above, no obstructive dz, felt NICM - Lasix 40 mg IV BID - I/O incomplete, net -2.38 L - wt down 3 kg - on Coreg w/ dose increased 01/12 - lisinopril 10 mg qd, aldactone 12.5 mg qd - cost of rx is an issue, so Entresto not started - off nitro paste, add Imdur 30 mg qd - f/u with office pharmacist and PA - diet and med compliance emphasized   2. SVT -  - vs atrial tach -  felt the brief episode today - continue BB - TSH nl  3. CAD -  - on ASA, BB, statin - elevated trop felt demand ischemia - cath results  above  4. HTN -  -  SBP 132 after rx yesterday - SBP 177/101 this am. - continue above rx w/ Imdur, take that at bedtime  5. Drug use - -continue cessation encouraged - he is living in a [employer]  6. Hyperlipidemia -  - profile above, AST 55 on 01/12 - started on Lipitor 80 mg qd - discuss w/ MD if should decrease to 40 mg qd  Plan: d/c today, f/u in ofc arranged, CMET at f/u office visit. May need pharmacy referral for [facility]  Per Dr Swaziland, d/c meds are Coreg 25 mg bid, lisinopril 20 mg qd, spiro 12.5 mg qd, Lipitor 80 mg qd. No Imdur.  For questions or updates, please contact [facility] Please consult [email or web address] for contact info under Cardiology/STEMI.  Signed, Theodore Demark, PA-C 05/02/2019, 7:22 AM

## 2019-05-05 ENCOUNTER — Other Ambulatory Visit: Payer: Self-pay

## 2019-05-05 ENCOUNTER — Encounter (HOSPITAL_COMMUNITY): Payer: Self-pay | Admitting: *Deleted

## 2019-05-05 ENCOUNTER — Emergency Department (HOSPITAL_COMMUNITY): Payer: Self-pay

## 2019-05-05 ENCOUNTER — Emergency Department (HOSPITAL_COMMUNITY)
Admission: EM | Admit: 2019-05-05 | Discharge: 2019-05-05 | Disposition: A | Discharge status: Home / Self Care | Payer: Self-pay | Attending: Emergency Medicine | Admitting: Emergency Medicine

## 2019-05-05 DIAGNOSIS — Z79899 Other long term (current) drug therapy: Secondary | ICD-10-CM | POA: Insufficient documentation

## 2019-05-05 DIAGNOSIS — B182 Chronic viral hepatitis C: Secondary | ICD-10-CM | POA: Insufficient documentation

## 2019-05-05 DIAGNOSIS — J449 Chronic obstructive pulmonary disease, unspecified: Secondary | ICD-10-CM | POA: Insufficient documentation

## 2019-05-05 DIAGNOSIS — R0600 Dyspnea, unspecified: Secondary | ICD-10-CM

## 2019-05-05 DIAGNOSIS — Z7982 Long term (current) use of aspirin: Secondary | ICD-10-CM | POA: Insufficient documentation

## 2019-05-05 DIAGNOSIS — F17210 Nicotine dependence, cigarettes, uncomplicated: Secondary | ICD-10-CM | POA: Insufficient documentation

## 2019-05-05 DIAGNOSIS — R0602 Shortness of breath: Secondary | ICD-10-CM | POA: Insufficient documentation

## 2019-05-05 DIAGNOSIS — I5023 Acute on chronic systolic (congestive) heart failure: Secondary | ICD-10-CM

## 2019-05-05 DIAGNOSIS — R0789 Other chest pain: Secondary | ICD-10-CM | POA: Insufficient documentation

## 2019-05-05 DIAGNOSIS — I10 Essential (primary) hypertension: Secondary | ICD-10-CM | POA: Insufficient documentation

## 2019-05-05 DIAGNOSIS — R600 Localized edema: Secondary | ICD-10-CM | POA: Insufficient documentation

## 2019-05-05 DIAGNOSIS — I252 Old myocardial infarction: Secondary | ICD-10-CM | POA: Insufficient documentation

## 2019-05-05 LAB — CBC
HCT: 49.6 % (ref 39.0–52.0)
Hemoglobin: 15.9 g/dL (ref 13.0–17.0)
MCH: 32.3 pg (ref 26.0–34.0)
MCHC: 32.1 g/dL (ref 30.0–36.0)
MCV: 100.6 fL — ABNORMAL HIGH (ref 80.0–100.0)
Platelets: 299 10*3/uL (ref 150–400)
RBC: 4.93 MIL/uL (ref 4.22–5.81)
RDW: 14 % (ref 11.5–15.5)
WBC: 11.3 10*3/uL — ABNORMAL HIGH (ref 4.0–10.5)
nRBC: 0 % (ref 0.0–0.2)

## 2019-05-05 LAB — TROPONIN I (HIGH SENSITIVITY)
Troponin I (High Sensitivity): 16 ng/L (ref <18)
Troponin I (High Sensitivity): 20 ng/L — ABNORMAL HIGH (ref <18)

## 2019-05-05 LAB — BASIC METABOLIC PANEL
Anion gap: 10 (ref 5–15)
BUN: 18 mg/dL (ref 6–20)
CO2: 28 mmol/L (ref 22–32)
Calcium: 9.2 mg/dL (ref 8.9–10.3)
Chloride: 103 mmol/L (ref 98–111)
Creatinine, Ser: 1.06 mg/dL (ref 0.61–1.24)
GFR calc Af Amer: >60 mL/min (ref >60)
GFR calc non Af Amer: >60 mL/min (ref >60)
Glucose, Bld: 85 mg/dL (ref 70–99)
Potassium: 4.1 mmol/L (ref 3.5–5.1)
Sodium: 141 mmol/L (ref 135–145)

## 2019-05-05 LAB — BRAIN NATRIURETIC PEPTIDE: B Natriuretic Peptide: 285.1 pg/mL — ABNORMAL HIGH (ref 0.0–100.0)

## 2019-05-05 MED ORDER — CARVEDILOL 25 MG PO TABS: 37.5000 mg | ORAL_TABLET | Freq: Two times a day (BID) | ORAL | 0 refills | Status: DC

## 2019-05-05 MED ORDER — SODIUM CHLORIDE 0.9% FLUSH
3.0000 mL | Freq: Once | INTRAVENOUS | Status: AC
  Administered 2019-05-05: 3 mL via INTRAVENOUS

## 2019-05-05 NOTE — Discharge Instructions (Addendum)
Return here as needed.  Follow-up with your cardiologist as scheduled.  Did change her dose of carvedilol to 1-1/2 tablets twice daily

## 2019-05-05 NOTE — ED Notes (Signed)
Pt states he feels great - much better than when he arrived.  PA notified.

## 2019-05-05 NOTE — Consult Note (Signed)
Cardiology Consultation:   Patient ID: Francisco [name]; [id number]; July 24, 1960   Admit date: 05/05/2019 Date of Consult: 05/05/2019  Primary Care Provider: Patient, No Pcp Per Primary Cardiologist: Peter Swaziland, MD  Primary Electrophysiologist:  Lewayne Bunting, MD  Chief Complaint: Dyspnea  Patient Profile:   Francisco [name] is a 59 y.o. male with a recent admission for acute systolic heart failure and chest pressure, found to have severely elevated LVEDP and moderate ISR of the RCA, but no obstructive disease. Now returns for dyspnea, cold hands and feet, feeling unwell.  History of Present Illness:   He was seen in the ED 04/16/19 with increasing SOB x3 days and nonproductive cough, treated as COPD exacerbation, sent home with prednisone. Seen in ED 04/25/19 but LWBS.  Admitted recently for acute on chronic systolic congestive heart failure with LVEF about 25%.  Right and left heart cath was performed which showed severely elevated LVEDP and moderate in-stent restenosis of the right coronary but no obstructive disease.  He was diuresed and medication adjustments were made.  He was discharged on 05/02/2019.  He reports medication compliance since then and felt well for 2 days and then started to have some shortness of breath, cold hands and feet and some nausea.  He said he was anxious about having recurrent symptoms and then presented to the ER.  Several hours after presentation he says he felt back to normal.  Initial work-up was unremarkable including a chest x-ray personally reviewed which showed no acute cardiopulmonary disease.  BNP in the 200s which is lower than recent.  Blood pressure is very poorly controlled with systolics in the 180s and diastolics in the 120s.  He says this is "normal" for him.  He denies any recent substance abuse.  He denies any chest pain.  EKG personally reviewed shows no acute changes compared to prior study.  Past Medical History:  Diagnosis Date  . COPD (chronic  obstructive pulmonary disease) (HCC)   . Coronary artery disease    a. s/p prior PCI.  Marland Kitchen Hepatitis-C   . Hypertension   . IV drug abuse (HCC)    a. 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  . Medically noncompliant   . MI (myocardial infarction) (HCC)   . Tobacco abuse     Past Surgical History:  Procedure Laterality Date  . BACK SURGERY    . NECK SURGERY    . RIGHT/LEFT HEART CATH AND CORONARY ANGIOGRAPHY N/A 04/30/2019   Procedure: RIGHT/LEFT HEART CATH AND CORONARY ANGIOGRAPHY;  Surgeon: Marykay Lex, MD;  Location: [facility] INVASIVE CV LAB;  Service: Cardiovascular;  Laterality: N/A;  . stints       Inpatient Medications: Scheduled Meds:  Continuous Infusions:  PRN Meds:   Home Meds: Prior to Admission medications   Medication Sig Start Date End Date Taking? Authorizing Provider  albuterol (VENTOLIN HFA) 108 (90 Base) MCG/ACT inhaler Inhale 1-2 puffs into the lungs every 6 (six) hours as needed for wheezing or shortness of breath. 04/16/19  Yes Terrilee Files, MD  aspirin EC 81 MG tablet Take 81 mg by mouth daily.   Yes [provider]  ibuprofen (ADVIL) 200 MG tablet Take 400-600 mg by mouth every 6 (six) hours as needed for moderate pain.   Yes [provider]  amLODipine (NORVASC) 10 MG tablet Take 1 tablet (10 mg total) by mouth daily. Patient not taking: Reported on 11/04/2018 06/20/18   Kathlen Mody, MD  ipratropium-albuterol (DUONEB) 0.5-2.5 (3) MG/3ML SOLN Take 3 mLs  by nebulization 3 (three) times daily. Patient not taking: Reported on 04/16/2019 06/19/18   Kathlen Mody, MD  mometasone-formoterol [facility]) 200-5 MCG/ACT AERO Inhale 2 puffs into the lungs 2 (two) times daily. Patient not taking: Reported on 11/04/2018 06/19/18   Kathlen Mody, MD  nicotine (NICODERM CQ - DOSED IN MG/24 HOURS) 14 mg/24hr patch Place 1 patch (14 mg total) onto the skin daily. Patient not taking: Reported on 11/04/2018 06/20/18   Kathlen Mody, MD  predniSONE  (DELTASONE) 20 MG tablet Take 3 tablets (60 mg total) by mouth daily. Patient not taking: Reported on 04/28/2019 04/16/19   Terrilee Files, MD    Allergies:    Allergies  Allergen Reactions  . Ivp Dye [Iodinated Diagnostic Agents] Other (See Comments)    seizures  . Tramadol Other (See Comments)    seizures    Social History:   Social History   Socioeconomic History  . Marital status: Divorced    Spouse name: Not on file  . Number of children: Not on file  . Years of education: Not on file  . Highest education level: Not on file  Occupational History  . Not on file  Tobacco Use  . Smoking status: Current Every Day Smoker    Packs/day: 0.75    Types: Cigarettes  . Smokeless tobacco: Never Used  Substance and Sexual Activity  . Alcohol use: Never  . Drug use: Yes    Types: Methamphetamines    Comment: used meth and heroin in the past  . Sexual activity: Not on file  Other Topics Concern  . Not on file  Social History Narrative  . Not on file   Social Determinants of Health   Financial Resource Strain:   . Difficulty of Paying Living Expenses: Not on file  Food Insecurity:   . Worried About Programme researcher, broadcasting/film/video in the Last Year: Not on file  . Ran Out of Food in the Last Year: Not on file  Transportation Needs:   . Lack of Transportation (Medical): Not on file  . Lack of Transportation (Non-Medical): Not on file  Physical Activity:   . Days of Exercise per Week: Not on file  . Minutes of Exercise per Session: Not on file  Stress:   . Feeling of Stress : Not on file  Social Connections:   . Frequency of Communication with Friends and Family: Not on file  . Frequency of Social Gatherings with Friends and Family: Not on file  . Attends Religious Services: Not on file  . Active Member of Clubs or Organizations: Not on file  . Attends Banker Meetings: Not on file  . Marital Status: Not on file  Intimate Partner Violence:   . Fear of Current or  Ex-Partner: Not on file  . Emotionally Abused: Not on file  . Physically Abused: Not on file  . Sexually Abused: Not on file     Family History:    Family History  Problem Relation Age of Onset  . Rheum arthritis Mother       ROS:  Pertinent items noted in HPI and remainder of comprehensive ROS otherwise negative.   Physical Exam/Data:   Vitals:   05/05/19 1300 05/05/19 1315 05/05/19 1330 05/05/19 1345  BP: (!) 168/110 (!) 174/118 (!) 150/108 (!) 151/105  Pulse: 86 85 87   Resp: 15 18 19 16   Temp:      TempSrc:      SpO2: 100% 99% 100%   Weight:  Height:       No intake or output data in the 24 hours ending 05/05/19 1520 Last 3 Weights 05/05/2019 05/02/2019 05/01/2019  Weight (lbs) 182 lb 15.7 oz 183 lb 184 lb 14.4 oz  Weight (kg) 83 kg 83.008 kg 83.87 kg     Body mass index is 24.82 kg/m.  General appearance: alert and no distress Neck: no carotid bruit, no JVD and thyroid not enlarged, symmetric, no tenderness/mass/nodules Lungs: clear to auscultation bilaterally Heart: regular rate and rhythm, S1, S2 normal, no murmur, click, rub or gallop Abdomen: soft, non-tender; bowel sounds normal; no masses,  no organomegaly Extremities: extremities normal, atraumatic, no cyanosis or edema Pulses: 2+ and symmetric Skin: Skin color, texture, turgor normal. No rashes or lesions Neurologic: Grossly normal Psych: Anxious  EKG:   Sinus rhythm at 82, diffuse T wave changes-personally reviewed  Telemetry: Normal sinus  Relevant CV Studies: Recent echo showed EF 25%  Laboratory Data:  High Sensitivity Troponin:   Recent Labs  Lab 04/28/19 2153 04/29/19 0023 04/29/19 0303 05/05/19 1059 05/05/19 1304  TROPONINIHS 46* 45* 37* 16 20*     Cardiac EnzymesNo results for input(s): TROPONINI in the last 168 hours. No results for input(s): TROPIPOC in the last 168 hours.  Chemistry Recent Labs  Lab 05/01/19 0429 05/02/19 0359 05/05/19 1059  NA 138 140 141  K 4.4 4.0  4.1  CL 103 100 103  CO2 26 30 28   GLUCOSE 119* 100* 85  BUN 22* 26* 18  CREATININE 1.01 1.13 1.06  CALCIUM 9.0 9.1 9.2  GFRNONAA >60 >60 >60  GFRAA >60 >60 >60  ANIONGAP 9 10 10     Recent Labs  Lab 05/01/19 0429  PROT 6.1*  ALBUMIN 3.2*  AST 28  ALT 55*  ALKPHOS 49  BILITOT 0.8   Hematology Recent Labs  Lab 05/01/19 0429 05/02/19 0359 05/05/19 1059  WBC 15.3* 11.2* 11.3*  RBC 4.54 5.05 4.93  HGB 14.8 16.5 15.9  HCT 44.9 49.7 49.6  MCV 98.9 98.4 100.6*  MCH 32.6 32.7 32.3  MCHC 33.0 33.2 32.1  RDW 14.2 14.0 14.0  PLT 249 238 299   BNP Recent Labs  Lab 04/29/19 0303 05/05/19 1114  BNP 682.1* 285.1*    DDimer  Recent Labs  Lab 04/28/19 2128  DDIMER <0.27     Radiology/Studies:  DG Chest 2 View  Result Date: 05/05/2019 CLINICAL DATA:  Chest pain. EXAM: CHEST - 2 VIEW COMPARISON:  April 28, 2019. FINDINGS: The heart size and mediastinal contours are within normal limits. Both lungs are clear. No pneumothorax or pleural effusion is noted. The visualized skeletal structures are unremarkable. IMPRESSION: No active cardiopulmonary disease. Electronically Signed   By: 05/07/2019 M.D.   On: 05/05/2019 10:59    Assessment and Plan:   Mr. Rihn is a 59 year old male with recent admission for acute on chronic systolic congestive heart failure.  Cardiac cath revealed no significant acute obstructive coronary disease and high LV filling pressures.  He was subsequently diuresed and felt well after discharge.  He now presents again with some concerning symptoms however they have completely resolved.  His BNP is lower than his most recent admission and lungs are clear with no x-ray or exam findings supporting congestive heart failure.  Blood pressure remains poorly controlled.  I advised further increase in his carvedilol to 37.5 mg twice daily.  He has an existing follow-up with Manson Passey, PA-C on 05/08/2019.  From a cardiology standpoint he can  be discharged home and  follow-up at that appointment.  Thanks for the consultation.  For questions or updates, please contact [place] Please consult [email or web address] for contact info under   Pixie Casino, MD, FACC, [place] Director of the [facility] Diplomate of the American Board of Clinical Lipidology Attending Cardiologist  Direct Dial: [phone]  Fax: [phone]  Website:  [email or web address]

## 2019-05-05 NOTE — ED Provider Notes (Signed)
[facility] EMERGENCY DEPARTMENT Provider Note   CSN: [id number] Arrival date & time: 05/05/19  0932     History Chief Complaint  Patient presents with  . Chest Pain  . Shortness of Breath    Francisco [name] is a 59 y.o. male.  HPI Patient presents to the emergency department with patient states that he has been having chest pressure with some shortness of breath over the last 3 days.  He states that he got home from the hospital several days ago started the day after he was discharged.  The patient states he has been taking his Lasix but still continues to have swelling in the feet and hands.  The patient states that he has episodes of diaphoresis as well.  The patient denies chest pain,  headache,blurred vision, neck pain, fever, cough, weakness, numbness, dizziness, anorexia, edema, abdominal pain, nausea, vomiting, diarrhea, rash, back pain, dysuria, hematemesis, bloody stool, near syncope, or syncope.    Past Medical History:  Diagnosis Date  . COPD (chronic obstructive pulmonary disease) (HCC)   . Coronary artery disease    a. s/p prior PCI.  Marland Kitchen Hepatitis-C   . Hypertension   . IV drug abuse (HCC)    a. 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  . Medically noncompliant   . MI (myocardial infarction) (HCC)   . Tobacco abuse     Patient Active Problem List   Diagnosis Date Noted  . Chronic obstructive pulmonary disease (HCC)   . Elevated troponin   . Unstable angina (HCC)   . Dilated cardiomyopathy (HCC)   . CAD S/P percutaneous coronary angioplasty   . Acute combined systolic and diastolic heart failure (HCC)   . ACS (acute coronary syndrome) (HCC) 04/29/2019  . Acute respiratory failure with hypoxia (HCC) 06/17/2018    Past Surgical History:  Procedure Laterality Date  . BACK SURGERY    . NECK SURGERY    . RIGHT/LEFT HEART CATH AND CORONARY ANGIOGRAPHY N/A 04/30/2019   Procedure: RIGHT/LEFT HEART CATH AND CORONARY ANGIOGRAPHY;  Surgeon:  Marykay Lex, MD;  Location: [facility] INVASIVE CV LAB;  Service: Cardiovascular;  Laterality: N/A;  . stints         Family History  Problem Relation Age of Onset  . Rheum arthritis Mother     Social History   Tobacco Use  . Smoking status: Current Every Day Smoker    Packs/day: 0.75    Types: Cigarettes  . Smokeless tobacco: Never Used  Substance Use Topics  . Alcohol use: Never  . Drug use: Yes    Types: Methamphetamines    Comment: used meth and heroin in the past    Home Medications Prior to Admission medications   Medication Sig Start Date End Date Taking? Authorizing Provider  albuterol (VENTOLIN HFA) 108 (90 Base) MCG/ACT inhaler Inhale 1-2 puffs into the lungs every 6 (six) hours as needed for wheezing or shortness of breath. 04/16/19   Terrilee Files, MD  aspirin EC 81 MG tablet Take 81 mg by mouth daily.    [provider]  atorvastatin (LIPITOR) 80 MG tablet Take 1 tablet (80 mg total) by mouth daily at 6 PM. 05/02/19   Barrett, Joline Salt, PA-C  carvedilol (COREG) 25 MG tablet Take 1 tablet (25 mg total) by mouth 2 (two) times daily with a meal. 05/02/19   Barrett, Joline Salt, PA-C  celecoxib (CELEBREX) 200 MG capsule Take 1 capsule (200 mg total) by mouth 2 (two) times daily. 05/02/19  Barrett, Evelene Croon, PA-C  furosemide (LASIX) 40 MG tablet Take 1 tablet (40 mg total) by mouth daily. 05/02/19   Barrett, Evelene Croon, PA-C  ipratropium-albuterol (DUONEB) 0.5-2.5 (3) MG/3ML SOLN Take 3 mLs by nebulization 3 (three) times daily. Patient not taking: Reported on 04/16/2019 06/19/18   Hosie Poisson, MD  lisinopril (ZESTRIL) 20 MG tablet Take 1 tablet (20 mg total) by mouth daily. 05/03/19   Barrett, Evelene Croon, PA-C  mometasone-formoterol (DULERA) 200-5 MCG/ACT AERO Inhale 2 puffs into the lungs 2 (two) times daily. Patient not taking: Reported on 11/04/2018 06/19/18   Hosie Poisson, MD  nicotine (NICODERM CQ - DOSED IN MG/24 HOURS) 14 mg/24hr patch Place 1 patch (14 mg total)  onto the skin daily. Patient not taking: Reported on 11/04/2018 06/20/18   Hosie Poisson, MD  nitroGLYCERIN (NITROSTAT) 0.4 MG SL tablet Place 1 tablet (0.4 mg total) under the tongue every 5 (five) minutes x 3 doses as needed for chest pain. 05/02/19   Barrett, Evelene Croon, PA-C  spironolactone (ALDACTONE) 25 MG tablet Take 0.5 tablets (12.5 mg total) by mouth daily. 05/03/19   Barrett, Evelene Croon, PA-C    Allergies    Ivp dye [iodinated diagnostic agents] and Tramadol  Review of Systems   Review of Systems All other systems negative except as documented in the HPI. All pertinent positives and negatives as reviewed in the HPI. Physical Exam Updated Vital Signs BP (!) 151/105   Pulse 87   Temp 98.4 F (36.9 C) (Oral)   Resp 16   Ht 6' (1.829 m)   Wt 83 kg   SpO2 100%   BMI 24.82 kg/m   Physical Exam Vitals and nursing note reviewed.  Constitutional:      General: He is not in acute distress.    Appearance: He is well-developed.  HENT:     Head: Normocephalic and atraumatic.  Eyes:     Pupils: Pupils are equal, round, and reactive to light.  Cardiovascular:     Rate and Rhythm: Normal rate and regular rhythm.     Heart sounds: Normal heart sounds. No murmur. No friction rub. No gallop.   Pulmonary:     Effort: Pulmonary effort is normal. No respiratory distress.     Breath sounds: Examination of the right-lower field reveals decreased breath sounds. Examination of the left-lower field reveals decreased breath sounds. Decreased breath sounds present. No wheezing or rhonchi.  Abdominal:     General: Bowel sounds are normal. There is no distension.     Palpations: Abdomen is soft.     Tenderness: There is no abdominal tenderness.  Musculoskeletal:     Cervical back: Normal range of motion and neck supple.  Skin:    General: Skin is warm and dry.     Capillary Refill: Capillary refill takes less than 2 seconds.     Findings: No erythema or rash.  Neurological:     Mental Status:  He is alert and oriented to person, place, and time.     Motor: No abnormal muscle tone.     Coordination: Coordination normal.  Psychiatric:        Behavior: Behavior normal.     ED Results / Procedures / Treatments   Labs (all labs ordered are listed, but only abnormal results are displayed) Labs Reviewed  CBC - Abnormal; Notable for the following components:      Result Value   WBC 11.3 (*)    MCV 100.6 (*)    All  other components within normal limits  BRAIN NATRIURETIC PEPTIDE - Abnormal; Notable for the following components:   B Natriuretic Peptide 285.1 (*)    All other components within normal limits  TROPONIN I (HIGH SENSITIVITY) - Abnormal; Notable for the following components:   Troponin I (High Sensitivity) 20 (*)    All other components within normal limits  BASIC METABOLIC PANEL  TROPONIN I (HIGH SENSITIVITY)    EKG EKG Interpretation  Date/Time:  Saturday May 05 2019 10:41:13 EST Ventricular Rate:  82 PR Interval:    QRS Duration: 86 QT Interval:  356 QTC Calculation: 416 R Axis:   76 Text Interpretation: Sinus rhythm Right atrial enlargement Abnormal T, consider ischemia, diffuse leads No acute changes twi in inferior leads not new No significant change since last tracing Confirmed by Derwood Kaplan ([id number]) on 05/05/2019 2:01:32 PM   Radiology DG Chest 2 View  Result Date: 05/05/2019 CLINICAL DATA:  Chest pain. EXAM: CHEST - 2 VIEW COMPARISON:  April 28, 2019. FINDINGS: The heart size and mediastinal contours are within normal limits. Both lungs are clear. No pneumothorax or pleural effusion is noted. The visualized skeletal structures are unremarkable. IMPRESSION: No active cardiopulmonary disease. Electronically Signed   By: Lupita Raider M.D.   On: 05/05/2019 10:59    Procedures Procedures (including critical care time)  Medications Ordered in ED Medications  sodium chloride flush (NS) 0.9 % injection 3 mL (3 mLs Intravenous Given 05/05/19  1156)    ED Course  I have reviewed the triage vital signs and the nursing notes.  Pertinent labs & imaging results that were available during my care of the patient were reviewed by me and considered in my medical decision making (see chart for details).    MDM Rules/Calculators/A&P                      Patient will be evaluated by cardiology.  Patient has been stable thus far in the emergency department.  I have advised the patient of the plan and all questions were answered. Final Clinical Impression(s) / ED Diagnoses Final diagnoses:  None    Rx / DC Orders ED Discharge Orders    None       Charlestine Night, PA-C 05/05/19 1507    Derwood Kaplan, MD 05/06/19 [phone]

## 2019-05-05 NOTE — ED Triage Notes (Signed)
Pt states chest pressure and sob and lower back pain since yesterday.  At 3 am he woke up with the pain again, and drenched in sweat.

## 2019-05-07 NOTE — Progress Notes (Signed)
Cardiology Office Note:    Date:  05/08/2019   ID:  Francisco [name], DOB 03-17-1961, MRN [id number]  PCP:  Patient, No Pcp Per  Cardiologist:  Peter Martinique, MD  Electrophysiologist: Cristopher Peru, MD  Referring MD: No ref. provider found   Chief Complaint  Patient presents with  . Follow-up    dyspnea, CP    History of Present Illness:    Francisco [name] is a 59 y.o. male with a hx of COPD, CAD status post PCI 5 years ago in [place], medication noncompliance, hep C not treated per notes, IVDU previously heroin now meth, tobacco use. He was seen in the ER on 04/08/2019 with increasing shortness of breath for 3 days and a nonproductive cough.  He was treated for COPD exacerbation and discharged on prednisone.  He came to the ER on 04/25/2019 with a left without being seen.  He was admitted 04/28/2019 with chest pain and shortness of breath.  Echocardiogram revealed new EF of 25 to 30% and grade 3 diastolic dysfunction. He underwent right and left heart cath which revealed in-stent restenosis of the distal 1/2 of stented segment (by report 3 overlapping stents). No intervention.  He was started on beta-blocker, spironolactone, and lisinopril.  He was not started on Entresto due to cost and medication adherence.  He underwent right and left heart cath elevated cardiac enzymes.  He had moderate but nonobstructive disease in multiple vessels.  Medical therapy was recommended.  He was discharged on 05/02/19.  Unfortunately he presented back to the ER on 05/05/2019 planes of dyspnea, cold hands and feet, and feeling unwell.  Cardiology was consulted.  He reported medication compliance.  Work-up was unremarkable chest x-ray without edema, BNP in the 200s, which was lower than discharge.  BP was poorly controlled with systolic in the 637C and diastolic in the 588F.  He was euvolemic on exam.  Carvedilol was increased to 37.5 mg twice daily and he was discharged from the ER.  Presents today for follow-up. He reports  no energy - significant DOE. He reports chest pressure - like someone sitting on his chest, has improved, but still present. I reviewed his heart cath results with him. He is hypertensive today. He does not have a PCP and has run out of his duoneb and can't afford dulera. He lives at a "sober house" which is similar to a "halfway house" per the patient.   He has cut back from smoking 1.5 ppd to 6 cigarettes yesterday. He will start trying to decrease his soda intake. I discussed fluid restriction. He seems invested in improving his overall health. He has been compliant on medications and fluid restriction. I have advised him to stay out of work at least until follow up with me. Will start slow with a few changes. Goal will be to stop smoking, decrease soda intake, decrease sweet tea intake, and increase walking program.   Past Medical History:  Diagnosis Date  . COPD (chronic obstructive pulmonary disease) ([place])   . Coronary artery disease    a. s/p prior PCI.  Marland Kitchen Hepatitis-C   . Hypertension   . IV drug abuse ([place])    a. 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  . Medically noncompliant   . MI (myocardial infarction) ([place])   . Tobacco abuse     Past Surgical History:  Procedure Laterality Date  . BACK SURGERY    . NECK SURGERY    . RIGHT/LEFT HEART CATH AND CORONARY ANGIOGRAPHY N/A 04/30/2019  Procedure: RIGHT/LEFT HEART CATH AND CORONARY ANGIOGRAPHY;  Surgeon: Marykay Lex, MD;  Location: [facility] INVASIVE CV LAB;  Service: Cardiovascular;  Laterality: N/A;  . stints      Current Medications: Current Meds  Medication Sig  . albuterol (VENTOLIN HFA) 108 (90 Base) MCG/ACT inhaler Inhale 1-2 puffs into the lungs every 6 (six) hours as needed for wheezing or shortness of breath.  Marland Kitchen aspirin EC 81 MG tablet Take 81 mg by mouth daily.  Marland Kitchen atorvastatin (LIPITOR) 80 MG tablet Take 1 tablet (80 mg total) by mouth daily at 6 PM.  . carvedilol (COREG) 25 MG tablet Take 1.5 tablets (37.5  mg total) by mouth 2 (two) times daily with a meal.  . celecoxib (CELEBREX) 200 MG capsule Take 1 capsule (200 mg total) by mouth 2 (two) times daily.  . furosemide (LASIX) 40 MG tablet Take 1 tablet (40 mg total) by mouth daily.  Marland Kitchen ipratropium-albuterol (DUONEB) 0.5-2.5 (3) MG/3ML SOLN Take 3 mLs by nebulization 3 (three) times daily.  Marland Kitchen lisinopril (ZESTRIL) 20 MG tablet Take 1 tablet (20 mg total) by mouth daily.  . mometasone-formoterol (DULERA) 200-5 MCG/ACT AERO Inhale 2 puffs into the lungs 2 (two) times daily.  . nicotine (NICODERM CQ - DOSED IN MG/24 HOURS) 14 mg/24hr patch Place 1 patch (14 mg total) onto the skin daily.  . nitroGLYCERIN (NITROSTAT) 0.4 MG SL tablet Place 1 tablet (0.4 mg total) under the tongue every 5 (five) minutes x 3 doses as needed for chest pain.  Marland Kitchen spironolactone (ALDACTONE) 25 MG tablet Take 0.5 tablets (12.5 mg total) by mouth daily.  . [DISCONTINUED] ipratropium-albuterol (DUONEB) 0.5-2.5 (3) MG/3ML SOLN Take 3 mLs by nebulization 3 (three) times daily.     Allergies:   Ivp dye [iodinated diagnostic agents] and Tramadol   Social History   Socioeconomic History  . Marital status: Divorced    Spouse name: Not on file  . Number of children: Not on file  . Years of education: Not on file  . Highest education level: Not on file  Occupational History  . Not on file  Tobacco Use  . Smoking status: Current Every Day Smoker    Packs/day: 0.75    Types: Cigarettes  . Smokeless tobacco: Never Used  Substance and Sexual Activity  . Alcohol use: Never  . Drug use: Yes    Types: Methamphetamines    Comment: used meth and heroin in the past  . Sexual activity: Not on file  Other Topics Concern  . Not on file  Social History Narrative  . Not on file   Social Determinants of Health   Financial Resource Strain:   . Difficulty of Paying Living Expenses: Not on file  Food Insecurity:   . Worried About Programme researcher, broadcasting/film/video in the Last Year: Not on file  .  Ran Out of Food in the Last Year: Not on file  Transportation Needs:   . Lack of Transportation (Medical): Not on file  . Lack of Transportation (Non-Medical): Not on file  Physical Activity:   . Days of Exercise per Week: Not on file  . Minutes of Exercise per Session: Not on file  Stress:   . Feeling of Stress : Not on file  Social Connections:   . Frequency of Communication with Friends and Family: Not on file  . Frequency of Social Gatherings with Friends and Family: Not on file  . Attends Religious Services: Not on file  . Active Member of Clubs  or Organizations: Not on file  . Attends Banker Meetings: Not on file  . Marital Status: Not on file     Family History: The patient's family history includes Rheum arthritis in his mother.  ROS:   Please see the history of present illness.     All other systems reviewed and are negative.  EKGs/Labs/Other Studies Reviewed:    The following studies were reviewed today:  Right and left heart cath 02/28/20:  Prox RCA to Mid RCA lesion is 50% stenosed - In-stent re-stenosis of distal 1/2 of stented segment (by report 3 overlapping stents). The remainder of the stent segment is widely patent.  Prox LAD lesion is 40% stenosed with 50% stenosed side branch in 1st Diag.  Mid Cx lesion is 25% stenosed.  Hemodynamic findings consistent with severe pulmonary hypertension.  LV end diastolic pressure is severely elevated.   SUMMARY  Moderate in-stent restenosis of the RCA with moderate bifurcation LAD-diagonal disease but no obstructive disease.  Suspect nonischemic cardiomyopathy  Severely elevated LVEDP, PCWP, PA diastolic and RA pressures in setting of known EF 25% and severely reduced CARDIAC OUTPUT/INDEX, consistent with severe ACUTE COMBINED SYSTOLIC AND DIASTOLIC HEART FAILURE  RECOMMENDATIONS  Return to nursing unit for ongoing care, needs aggressive diuresis and adjustment of heart failure  medications.  Have written for 80 mg of Lasix x1 today and then 40 mg IV twice daily starting tomorrow.  If urine output does not pick up, may want to consider milrinone.   Echo 04/29/19:  1. Left ventricular ejection fraction, by visual estimation, is 25 to 30%. The left ventricle has severely decreased function. There is mildly increased left ventricular hypertrophy.  2. Left ventricular diastolic parameters are consistent with Grade III diastolic dysfunction (restrictive).  3. The left ventricle demonstrates global hypokinesis.  4. Global right ventricle has normal systolic function.The right ventricular size is normal.  5. Left atrial size was mildly dilated.  6. Right atrial size was normal.  7. The mitral valve is normal in structure. Trivial mitral valve regurgitation. No evidence of mitral stenosis.  8. The tricuspid valve is normal in structure.  9. The aortic valve is tricuspid. Aortic valve regurgitation is not visualized. Mild aortic valve sclerosis without stenosis. 10. The pulmonic valve was normal in structure. Pulmonic valve regurgitation is not visualized. 11. The inferior vena cava is normal in size with greater than 50% respiratory variability, suggesting right atrial pressure of 3 mmHg. 12. Severe global reduction in LV systolic function; restrictive filling; mild LVH; mild LAE.   EKG:  EKG is ordered today.  The ekg ordered today demonstrates sinus rhythm with HR 84, TWI V4-6 and inferior leads that is stable from prior tracing  Recent Labs: 05/01/2019: ALT 55 05/02/2019: TSH 2.758 05/05/2019: B Natriuretic Peptide 285.1; BUN 18; Creatinine, Ser 1.06; Hemoglobin 15.9; Platelets 299; Potassium 4.1; Sodium 141  Recent Lipid Panel    Component Value Date/Time   CHOL 215 (H) 04/29/2019 0303   TRIG 57 04/29/2019 0303   HDL 93 04/29/2019 0303   CHOLHDL 2.3 04/29/2019 0303   VLDL 11 04/29/2019 0303   LDLCALC 111 (H) 04/29/2019 0303    Physical Exam:    VS:  BP (!)  169/97 (BP Location: Left Arm, Patient Position: Sitting, Cuff Size: Normal)   Pulse 92   Ht 6' (1.829 m)   Wt 185 lb (83.9 kg)   SpO2 99%   BMI 25.09 kg/m     Wt Readings from Last 3 Encounters:  05/08/19 185 lb (83.9 kg)  05/05/19 182 lb 15.7 oz (83 kg)  05/02/19 183 lb (83 kg)     GEN:  Well nourished, well developed in no acute distress HEENT: Normal NECK: No JVD; No carotid bruits LYMPHATICS: No lymphadenopathy CARDIAC: RRR, no murmurs, rubs, gallops RESPIRATORY:  Clear to auscultation without rales, wheezing or rhonchi  ABDOMEN: Soft, non-tender, non-distended MUSCULOSKELETAL:  No edema; No deformity  SKIN: Warm and dry NEUROLOGIC:  Alert and oriented x 3 PSYCHIATRIC:  Normal affect   ASSESSMENT:    1. Unstable angina (HCC)   2. Dilated cardiomyopathy (HCC)   3. CAD S/P percutaneous coronary angioplasty   4. Chronic combined systolic and diastolic heart failure (HCC)   5. Essential hypertension   6. Hyperlipidemia LDL goal <70    PLAN:    In order of problems listed above:  Chronic systolic and diastolic heart failure Hypertension Dyspnea on exertion COPD - Continue Coreg 37.5 mg twice daily, spironolactone 12.5 mg, and lisinopril 20 mg - Continue 40 mg of Lasix daily - he appears euvolemic today - I will add imdur 15 mg --> increase to 30 mg  - will likely need to titrate up imdur - close follow up - he would be a great candidate for entresto if he can get patient assistance - if he continues to be dyspneic, may need to increase lasix vs starting entresto; but I suspect his dyspnea is related to his COPD - he has been out of his inhalers (I refilled duoneb today, he can't afford dulera) - he needs a PCP - have given him information for health and wellness clinic so they can help with medication assitance   Hypertension - medications as above - BP not controlled - I have added imdur, may need additional agent, but will titrate imdur  first   Nonobstructive CAD Hyperlipidemia with LDL goal < 70 - Continue aspirin and 80 mg Lipitor - 04/29/2019: Cholesterol 215; HDL 93; LDL Cholesterol 111; Triglycerides 57; VLDL 11 - recheck labs in 6 weeks - added imdur as above   Close follow up with me in 2-3 weeks.    Medication Adjustments/Labs and Tests Ordered: Current medicines are reviewed at length with the patient today.  Concerns regarding medicines are outlined above.  No orders of the defined types were placed in this encounter.  Meds ordered this encounter  Medications  . isosorbide mononitrate (IMDUR) 30 MG 24 hr tablet    Sig: Take 1 tablet (30 mg total) by mouth daily. 1/2 tab (15mg ) for one week the whole tab(30mg ) therafter    Dispense:  34 tablet    Refill:  3  . ipratropium-albuterol (DUONEB) 0.5-2.5 (3) MG/3ML SOLN    Sig: Take 3 mLs by nebulization 3 (three) times daily.    Dispense:  360 mL    Refill:  5    Signed, , Marcelino Duster  05/08/2019 4:35 PM    [facility]

## 2019-05-08 ENCOUNTER — Ambulatory Visit (INDEPENDENT_AMBULATORY_CARE_PROVIDER_SITE_OTHER): Payer: Self-pay | Admitting: Physician Assistant

## 2019-05-08 ENCOUNTER — Encounter: Payer: Self-pay | Admitting: Physician Assistant

## 2019-05-08 ENCOUNTER — Other Ambulatory Visit: Payer: Self-pay

## 2019-05-08 VITALS — BP 169/97 | HR 92 | Ht 72.0 in | Wt 185.0 lb

## 2019-05-08 DIAGNOSIS — I10 Essential (primary) hypertension: Secondary | ICD-10-CM

## 2019-05-08 DIAGNOSIS — E785 Hyperlipidemia, unspecified: Secondary | ICD-10-CM

## 2019-05-08 DIAGNOSIS — I2510 Atherosclerotic heart disease of native coronary artery without angina pectoris: Secondary | ICD-10-CM

## 2019-05-08 DIAGNOSIS — Z9861 Coronary angioplasty status: Secondary | ICD-10-CM

## 2019-05-08 DIAGNOSIS — I5042 Chronic combined systolic (congestive) and diastolic (congestive) heart failure: Secondary | ICD-10-CM

## 2019-05-08 DIAGNOSIS — I420 Dilated cardiomyopathy: Secondary | ICD-10-CM

## 2019-05-08 DIAGNOSIS — I200 Unstable angina: Secondary | ICD-10-CM

## 2019-05-08 MED ORDER — IPRATROPIUM-ALBUTEROL 0.5-2.5 (3) MG/3ML IN SOLN: 3.0000 mL | Freq: Three times a day (TID) | RESPIRATORY_TRACT | 5 refills | Status: DC

## 2019-05-08 MED ORDER — ISOSORBIDE MONONITRATE ER 30 MG PO TB24: 30.0000 mg | ORAL_TABLET | Freq: Every day | ORAL | 3 refills | Status: DC

## 2019-05-08 NOTE — Patient Instructions (Signed)
Medication Instructions:  START IMDUR 1/2 tab (15mg ) for one week the whole tab(30mg ) thereafter. If you need a refill on your cardiac medications before your next appointment, please call your pharmacy.  Special Instructions: CALL [facility] FOR APPOINTMENT AT [phone]  Reduce your risk of getting COVID-19 With your heart disease it is especially important for people at increased risk of severe illness from COVID-19, and those who live with them, to protect themselves from getting COVID-19. The best way to protect yourself and to help reduce the spread of the virus that causes COVID-19 is to: [phone] Limit your interactions with other people as much as possible. . Take precautions to prevent getting COVID-19 when you do interact with others. If you start feeling sick and think you may have COVID-19, get in touch with your healthcare provider within 24 hours.  Follow-Up: ON 05-31-2019 @345  PM WITH 07-29-2019, PA-C  In Person [place], Bettina Gavia.    At [facility], you and your health needs are our priority.  As part of our continuing mission to provide you with exceptional heart care, we have created designated Provider Care Teams.  These Care Teams include your primary Cardiologist (physician) and Advanced Practice Providers (APPs -  Physician Assistants and Nurse Practitioners) who all work together to provide you with the care you need, when you need it.  Thank you for choosing [facility] at [facility]!!

## 2019-05-08 NOTE — Telephone Encounter (Signed)
TOC call attempted. Pt has appt today at 3:30pm.

## 2019-05-10 NOTE — Addendum Note (Signed)
Addended by: Alyson Ingles on: 05/10/2019 10:06 AM   Modules accepted: Orders

## 2019-05-18 ENCOUNTER — Other Ambulatory Visit: Payer: Self-pay

## 2019-05-18 ENCOUNTER — Encounter (HOSPITAL_COMMUNITY): Payer: Self-pay | Admitting: Emergency Medicine

## 2019-05-18 ENCOUNTER — Emergency Department (HOSPITAL_COMMUNITY)
Admission: EM | Admit: 2019-05-18 | Discharge: 2019-05-18 | Disposition: A | Discharge status: Home / Self Care | Payer: Self-pay | Attending: Emergency Medicine | Admitting: Emergency Medicine

## 2019-05-18 ENCOUNTER — Emergency Department (HOSPITAL_COMMUNITY): Payer: Self-pay

## 2019-05-18 DIAGNOSIS — B182 Chronic viral hepatitis C: Secondary | ICD-10-CM | POA: Insufficient documentation

## 2019-05-18 DIAGNOSIS — I110 Hypertensive heart disease with heart failure: Secondary | ICD-10-CM | POA: Insufficient documentation

## 2019-05-18 DIAGNOSIS — R0602 Shortness of breath: Secondary | ICD-10-CM | POA: Insufficient documentation

## 2019-05-18 DIAGNOSIS — R079 Chest pain, unspecified: Secondary | ICD-10-CM

## 2019-05-18 DIAGNOSIS — Z7982 Long term (current) use of aspirin: Secondary | ICD-10-CM | POA: Insufficient documentation

## 2019-05-18 DIAGNOSIS — I5042 Chronic combined systolic (congestive) and diastolic (congestive) heart failure: Secondary | ICD-10-CM | POA: Insufficient documentation

## 2019-05-18 DIAGNOSIS — F17210 Nicotine dependence, cigarettes, uncomplicated: Secondary | ICD-10-CM | POA: Insufficient documentation

## 2019-05-18 DIAGNOSIS — Z20822 Contact with and (suspected) exposure to covid-19: Secondary | ICD-10-CM | POA: Insufficient documentation

## 2019-05-18 DIAGNOSIS — I252 Old myocardial infarction: Secondary | ICD-10-CM | POA: Insufficient documentation

## 2019-05-18 DIAGNOSIS — Z79899 Other long term (current) drug therapy: Secondary | ICD-10-CM | POA: Insufficient documentation

## 2019-05-18 DIAGNOSIS — I2510 Atherosclerotic heart disease of native coronary artery without angina pectoris: Secondary | ICD-10-CM | POA: Insufficient documentation

## 2019-05-18 DIAGNOSIS — J449 Chronic obstructive pulmonary disease, unspecified: Secondary | ICD-10-CM | POA: Insufficient documentation

## 2019-05-18 DIAGNOSIS — R0789 Other chest pain: Secondary | ICD-10-CM | POA: Insufficient documentation

## 2019-05-18 LAB — HEPATIC FUNCTION PANEL
ALT: 320 U/L — ABNORMAL HIGH (ref 0–44)
AST: 144 U/L — ABNORMAL HIGH (ref 15–41)
Albumin: 4 g/dL (ref 3.5–5.0)
Alkaline Phosphatase: 76 U/L (ref 38–126)
Bilirubin, Direct: 0.2 mg/dL (ref 0.0–0.2)
Indirect Bilirubin: 0.9 mg/dL (ref 0.3–0.9)
Total Bilirubin: 1.1 mg/dL (ref 0.3–1.2)
Total Protein: 7.6 g/dL (ref 6.5–8.1)

## 2019-05-18 LAB — RESPIRATORY PANEL BY RT PCR (FLU A&B, COVID)
Influenza A by PCR: NEGATIVE
Influenza B by PCR: NEGATIVE
SARS Coronavirus 2 by RT PCR: NEGATIVE

## 2019-05-18 LAB — BASIC METABOLIC PANEL
Anion gap: 10 (ref 5–15)
BUN: 14 mg/dL (ref 6–20)
CO2: 32 mmol/L (ref 22–32)
Calcium: 9.9 mg/dL (ref 8.9–10.3)
Chloride: 98 mmol/L (ref 98–111)
Creatinine, Ser: 1.07 mg/dL (ref 0.61–1.24)
GFR calc Af Amer: >60 mL/min (ref >60)
GFR calc non Af Amer: >60 mL/min (ref >60)
Glucose, Bld: 86 mg/dL (ref 70–99)
Potassium: 3.8 mmol/L (ref 3.5–5.1)
Sodium: 140 mmol/L (ref 135–145)

## 2019-05-18 LAB — CBC
HCT: 50.7 % (ref 39.0–52.0)
Hemoglobin: 16.5 g/dL (ref 13.0–17.0)
MCH: 32.7 pg (ref 26.0–34.0)
MCHC: 32.5 g/dL (ref 30.0–36.0)
MCV: 100.4 fL — ABNORMAL HIGH (ref 80.0–100.0)
Platelets: 208 10*3/uL (ref 150–400)
RBC: 5.05 MIL/uL (ref 4.22–5.81)
RDW: 12.9 % (ref 11.5–15.5)
WBC: 8.6 10*3/uL (ref 4.0–10.5)
nRBC: 0 % (ref 0.0–0.2)

## 2019-05-18 LAB — BRAIN NATRIURETIC PEPTIDE: B Natriuretic Peptide: 212.1 pg/mL — ABNORMAL HIGH (ref 0.0–100.0)

## 2019-05-18 LAB — TROPONIN I (HIGH SENSITIVITY)
Troponin I (High Sensitivity): 12 ng/L (ref <18)
Troponin I (High Sensitivity): 8 ng/L (ref <18)

## 2019-05-18 MED ORDER — AEROCHAMBER PLUS FLO-VU LARGE MISC
1.0000 | Freq: Once | Status: AC
  Administered 2019-05-18: 1

## 2019-05-18 MED ORDER — IPRATROPIUM BROMIDE HFA 17 MCG/ACT IN AERS
2.0000 | INHALATION_SPRAY | Freq: Once | RESPIRATORY_TRACT | Status: AC
  Administered 2019-05-18: 2 via RESPIRATORY_TRACT
  Filled 2019-05-18: qty 12.9

## 2019-05-18 MED ORDER — SODIUM CHLORIDE 0.9% FLUSH
3.0000 mL | Freq: Once | INTRAVENOUS | Status: AC
  Administered 2019-05-18: 3 mL via INTRAVENOUS

## 2019-05-18 MED ORDER — METHYLPREDNISOLONE SODIUM SUCC 125 MG IJ SOLR
125.0000 mg | Freq: Once | INTRAMUSCULAR | Status: AC
  Administered 2019-05-18: 125 mg via INTRAVENOUS
  Filled 2019-05-18: qty 2

## 2019-05-18 MED ORDER — MOMETASONE FURO-FORMOTEROL FUM 100-5 MCG/ACT IN AERO
2.0000 | INHALATION_SPRAY | Freq: Two times a day (BID) | RESPIRATORY_TRACT | Status: DC
  Filled 2019-05-18: qty 8.8

## 2019-05-18 MED ORDER — ALBUTEROL SULFATE HFA 108 (90 BASE) MCG/ACT IN AERS
4.0000 | INHALATION_SPRAY | Freq: Once | RESPIRATORY_TRACT | Status: AC
  Administered 2019-05-18: 4 via RESPIRATORY_TRACT
  Filled 2019-05-18: qty 6.7

## 2019-05-18 NOTE — ED Provider Notes (Signed)
[facility] EMERGENCY DEPARTMENT Provider Note   CSN: [id number] Arrival date & time: 05/18/19  1327     History Chief Complaint  Patient presents with  . Chest Pain    Francisco [name] is a 59 y.o. male.  Francisco [name] is a 59 y.o. male with a history of multivessel CAD, heart failure, COPD, hep C, methamphetamine abuse, tobacco abuse, hypertension, who presents to the emergency department for evaluation of ongoing shortness of breath and chest pain.  Patient was admitted to the hospital on 1/9 for chest pain and shortness of breath with concern for unstable angina, he underwent a right and left heart cath that showed multivessel disease and medical management was recommended.  Patient reports that he is continued to deal with persistent chest pressure that is worse with activity as well as shortness of breath.  Patient states he feels like he can even take a shower without getting out of breath.  He was going to the mall today to walk and could not even make it from the parking lot into the mall due to shortness of breath and so presents today for reevaluation.  Patient was seen in the cardiology office for the symptoms on 1/19 and they have been working on adjusting his medications, he was recently started on Imdur, but patient reports he has not filled the prescription yet, because he cannot afford it.  He is trying to stop smoking and is down to 6 cigarettes a day.  Reports methamphetamine abuse but is trying to stop this as well, last used yesterday.  Patient states he has been compliant with his medications, recently had his duo nebs refilled at the cardiologist, but reports he has not filled them due to financial constraints and has been unable to afford his Dulera.  He reports cough is slightly worse than usual with intermittent clear sputum production he denies any fevers or sick contacts.  States he has been taking his Lasix regularly and has not noted new or worsening swelling  in his legs.  No other aggravating or alleviating factors.        Past Medical History:  Diagnosis Date  . COPD (chronic obstructive pulmonary disease) ([place])   . Coronary artery disease    a. s/p prior PCI.  Marland Kitchen Hepatitis-C   . Hypertension   . IV drug abuse ([place])    a. 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  . Medically noncompliant   . MI (myocardial infarction) ([place])   . Tobacco abuse     Patient Active Problem List   Diagnosis Date Noted  . Chronic obstructive pulmonary disease ([place])   . Elevated troponin   . Unstable angina ([place])   . Dilated cardiomyopathy ([place])   . CAD S/P percutaneous coronary angioplasty   . Acute combined systolic and diastolic heart failure ([place])   . ACS (acute coronary syndrome) ([place]) 04/29/2019  . Acute respiratory failure with hypoxia ([place]) 06/17/2018    Past Surgical History:  Procedure Laterality Date  . BACK SURGERY    . NECK SURGERY    . RIGHT/LEFT HEART CATH AND CORONARY ANGIOGRAPHY N/A 04/30/2019   Procedure: RIGHT/LEFT HEART CATH AND CORONARY ANGIOGRAPHY;  Surgeon: Leonie Man, MD;  Location: [facility];  Service: Cardiovascular;  Laterality: N/A;  . stints         Family History  Problem Relation Age of Onset  . Rheum arthritis Mother     Social History   Tobacco Use  . Smoking  status: Current Every Day Smoker    Packs/day: 0.75    Types: Cigarettes  . Smokeless tobacco: Never Used  Substance Use Topics  . Alcohol use: Never  . Drug use: Yes    Types: Methamphetamines    Comment: used meth and heroin in the past    Home Medications Prior to Admission medications   Medication Sig Start Date End Date Taking? Authorizing Provider  albuterol (VENTOLIN HFA) 108 (90 Base) MCG/ACT inhaler Inhale 1-2 puffs into the lungs every 6 (six) hours as needed for wheezing or shortness of breath. 04/16/19   Terrilee Files, MD  aspirin EC 81 MG tablet Take 81 mg by mouth daily.    [provider]    atorvastatin (LIPITOR) 80 MG tablet Take 1 tablet (80 mg total) by mouth daily at 6 PM. 05/02/19   Barrett, Joline Salt, PA-C  carvedilol (COREG) 25 MG tablet Take 1.5 tablets (37.5 mg total) by mouth 2 (two) times daily with a meal. 05/05/19   Lawyer, Cristal Deer, PA-C  celecoxib (CELEBREX) 200 MG capsule Take 1 capsule (200 mg total) by mouth 2 (two) times daily. 05/02/19   Barrett, Joline Salt, PA-C  furosemide (LASIX) 40 MG tablet Take 1 tablet (40 mg total) by mouth daily. 05/02/19   Barrett, Joline Salt, PA-C  ipratropium-albuterol (DUONEB) 0.5-2.5 (3) MG/3ML SOLN Take 3 mLs by nebulization 3 (three) times daily. 05/08/19   Duke, Roe Rutherford, PA  isosorbide mononitrate (IMDUR) 30 MG 24 hr tablet Take 1 tablet (30 mg total) by mouth daily. 1/2 tab (15mg ) for one week the whole tab(30mg ) therafter 05/08/19 08/06/19  Duke, 08/08/19, PA  lisinopril (ZESTRIL) 20 MG tablet Take 1 tablet (20 mg total) by mouth daily. 05/03/19   Barrett, 05/05/19, PA-C  mometasone-formoterol (DULERA) 200-5 MCG/ACT AERO Inhale 2 puffs into the lungs 2 (two) times daily. 06/19/18   08/19/18, MD  nicotine (NICODERM CQ - DOSED IN MG/24 HOURS) 14 mg/24hr patch Place 1 patch (14 mg total) onto the skin daily. 06/20/18   08/20/18, MD  nitroGLYCERIN (NITROSTAT) 0.4 MG SL tablet Place 1 tablet (0.4 mg total) under the tongue every 5 (five) minutes x 3 doses as needed for chest pain. 05/02/19   Barrett, 05/04/19, PA-C  spironolactone (ALDACTONE) 25 MG tablet Take 0.5 tablets (12.5 mg total) by mouth daily. 05/03/19   Barrett, 05/05/19, PA-C    Allergies    Ivp dye [iodinated diagnostic agents] and Tramadol  Review of Systems   Review of Systems  Constitutional: Positive for fatigue. Negative for chills and fever.  HENT: Negative.   Respiratory: Positive for cough and shortness of breath.   Cardiovascular: Positive for chest pain. Negative for palpitations and leg swelling.  Gastrointestinal: Negative for abdominal pain, nausea  and vomiting.  Musculoskeletal: Negative for arthralgias and myalgias.  Skin: Negative for color change and rash.  Neurological: Negative for dizziness, syncope and light-headedness.    Physical Exam Updated Vital Signs BP (!) 139/96 (BP Location: Right Arm)   Pulse 85   Temp 97.8 F (36.6 C) (Oral)   Resp 16   SpO2 99%   Physical Exam Vitals and nursing note reviewed.  Constitutional:      General: He is not in acute distress.    Appearance: He is well-developed. He is not diaphoretic.     Comments: No acute distress  HENT:     Head: Normocephalic and atraumatic.  Eyes:     General:  Right eye: No discharge.        Left eye: No discharge.     Pupils: Pupils are equal, round, and reactive to light.  Cardiovascular:     Rate and Rhythm: Normal rate and regular rhythm.     Pulses:          Radial pulses are 2+ on the right side and 2+ on the left side.     Heart sounds: Normal heart sounds. No murmur. No friction rub. No gallop.   Pulmonary:     Effort: Pulmonary effort is normal. No respiratory distress.     Breath sounds: Normal breath sounds. No wheezing or rales.     Comments: Mildly increased work of breathing but patient is able to speak in full sentences, lungs tight with decreased air movement and faint wheezing noted in bilateral bases, no rales or rhonchi Abdominal:     General: Bowel sounds are normal. There is no distension.     Palpations: Abdomen is soft. There is no mass.     Tenderness: There is no abdominal tenderness. There is no guarding.     Comments: Abdomen soft, nondistended, nontender to palpation in all quadrants without guarding or peritoneal signs  Musculoskeletal:        General: No deformity.     Cervical back: Neck supple.     Right lower leg: No edema.     Left lower leg: No edema.     Comments: Bilateral lower extremities warm and well perfused without edema  Skin:    General: Skin is warm and dry.     Capillary Refill: Capillary  refill takes less than 2 seconds.  Neurological:     Mental Status: He is alert.     Coordination: Coordination normal.     Comments: Speech is clear, able to follow commands Moves extremities without ataxia, coordination intact  Psychiatric:        Mood and Affect: Mood normal.        Behavior: Behavior normal.     ED Results / Procedures / Treatments   Labs (all labs ordered are listed, but only abnormal results are displayed) Labs Reviewed  CBC - Abnormal; Notable for the following components:      Result Value   MCV 100.4 (*)    All other components within normal limits  RESPIRATORY PANEL BY RT PCR (FLU A&B, COVID)  BASIC METABOLIC PANEL  BRAIN NATRIURETIC PEPTIDE  HEPATIC FUNCTION PANEL  TROPONIN I (HIGH SENSITIVITY)    EKG EKG Interpretation  Date/Time:  Friday May 18 2019 13:28:50 EST Ventricular Rate:  84 PR Interval:  138 QRS Duration: 80 QT Interval:  332 QTC Calculation: 392 R Axis:   73 Text Interpretation: Normal sinus rhythm Right atrial enlargement T wave abnormality, consider inferolateral ischemia Abnormal ECG No significant change since last tracing Confirmed by Jacalyn Lefevre [phone]) on 05/18/2019 2:41:05 PM   Radiology DG Chest 2 View  Result Date: 05/18/2019 CLINICAL DATA:  Shortness of breath EXAM: CHEST - 2 VIEW COMPARISON:  04/28/2019 FINDINGS: The heart size and mediastinal contours are within normal limits. Coronary stenting is noted. Both lungs are clear. No pleural effusion or pneumothorax. Chronic mid thoracic compression deformities. IMPRESSION: No acute process in the chest. Electronically Signed   By: Guadlupe Spanish M.D.   On: 05/18/2019 14:02    Procedures Procedures (including critical care time)  Medications Ordered in ED Medications  sodium chloride flush (NS) 0.9 % injection 3 mL (has no administration  in time range)  albuterol (VENTOLIN HFA) 108 (90 Base) MCG/ACT inhaler 4 puff (has no administration in time range)    ipratropium (ATROVENT HFA) inhaler 2 puff (has no administration in time range)  AeroChamber Plus Flo-Vu Large MISC 1 each (has no administration in time range)  methylPREDNISolone sodium succinate (SOLU-MEDROL) 125 mg/2 mL injection 125 mg (has no administration in time range)    ED Course  I have reviewed the triage vital signs and the nursing notes.  Pertinent labs & imaging results that were available during my care of the patient were reviewed by me and considered in my medical decision making (see chart for details).    MDM Rules/Calculators/A&P                      59 year old male presents with persistent chest pain and shortness of breath over the past 2 to 3 weeks, was recently admitted for unstable angina and had a heart cath that showed multivessel disease and medical management was recommended, patient also has COPD and has been unable to afford some of his usual COPD medications such as DuoNeb's and Dulera.  He reports persistent dyspnea on exertion and chest pressure that is worse with activity.  He was recently started on Imdur but has not filled or started this medication, he has been compliant with his Lasix and denies lower extremity edema.  Patient ambulated in the department and had some tachypnea and increased work of breathing but did maintained O2 sats of 97-99%.  I suspect a lot of patient's symptoms are chronic I certainly think that his COPD is playing a role today as his lung sounds tight with wheezing and decreased air movement.  Will treat with albuterol, ipratropium, steroids and will provide patient with [facility] inhaler.  Will check basic labs, troponin, BNP and chest x-ray.  Chest x-ray is clear without evidence of pulmonary edema or infiltrate.  EKG shows sinus rhythm with no significant changes when compared to last tracing.  Initial troponin is not elevated at 12, no significant electrolyte derangements, normal renal function, no leukocytosis and normal  hemoglobin.  BMP, hepatic function panel and Covid test are pending.  I had a long conversation with the patient about medication compliance and methamphetamine abuse and that these are likely contributing to his continued symptoms.  As long as patient's lab work does not show significant changes I feel he will be stable for discharge home, he has been provided his Dulera inhaler as well as albuterol and ipratropium, I think would be reasonable to send patient on short course of steroids given wheezing and decreased air movement today.  If BNP and second troponin are reassuring feel patient would be stable for continued outpatient follow-up with cardiology have stressed the importance of him filling and starting his prescription for Imdur as this will likely help with his chest pain.  At shift change care signed out to PA Graciella Freer who will follow up on pending lab results and disposition appropriately.  Final Clinical Impression(s) / ED Diagnoses Final diagnoses:  Central chest pain  Shortness of breath  Chronic obstructive pulmonary disease, unspecified COPD type [facility])    Rx / DC Orders ED Discharge Orders    None       Legrand Rams 05/18/19 1515    Jacalyn Lefevre, MD 05/21/19 [phone]

## 2019-05-18 NOTE — ED Provider Notes (Signed)
Care assumed from [facility], PA-C at shift change with labs pending.   In brief, this patient is a 59 y.o. M with PMH/o CAD, COPD, hep C who presents for evaluation of chest pain and SOB. He was recently admitted on 04/28/19 for unstable angina. Cath showed mild disease and was recommend medical management.  Patient comes in today because he continues to have symptoms.  He has not picked up any of his inhalers.  He is still smoking and is doing meth.  His last reported meth use was yesterday.  Patient was prescribed Imdur and inhalers by cardiology but does not have them.  Please see note from previous provider for full history/physical exam.    Physical Exam  BP (!) 170/106   Pulse 95   Temp 97.8 F (36.6 C) (Oral)   Resp 18   SpO2 96%   Physical Exam  ED Course/Procedures     Procedures  MDM   PLAN: Patient pending BMP and troponin.  If this is unremarkable, patient can go home.  He has been refilled on his prescriptions.  MDM: BMP is at baseline.  Delta troponin is negative.  I discussed with patient the importance of taking his medications as prescribed.  Encouraged him to stop taking meth as this may worsen his symptoms.  Patient is instructed to follow-up with cardiology as directed. At this time, patient exhibits no emergent life-threatening condition that require further evaluation in ED or admission. Patient had ample opportunity for questions and discussion. All patient's questions were answered with full understanding. Strict return precautions discussed. Patient expresses understanding and agreement to plan.   1. Central chest pain   2. Shortness of breath   3. Chronic obstructive pulmonary disease, unspecified COPD type (HCC)     Portions of this note were generated with Scientist, clinical (histocompatibility and immunogenetics). Dictation errors may occur despite best attempts at proofreading.    Rosana Hoes 05/18/19 Roda Shutters, MD 05/18/19 [phone]

## 2019-05-18 NOTE — ED Notes (Signed)
Pt. Transported to xray 

## 2019-05-18 NOTE — ED Triage Notes (Addendum)
Pt reports ongoing sob and cp for the last 2-3 weeks progressively worse. Hx copd and CHF was recently admitted to hospital for this. Pt states since being home he has been struggling to due ADLs due to sob. Pt still using IV Meth last used yesterday.

## 2019-05-18 NOTE — Discharge Instructions (Addendum)
As we discussed, you have been given your inhalers and your medications.  Is very important for you to take them.  Follow-up with cardiology as directed.  Stop smoking, doing meth.  This may worsen your condition.  Return the emergency department for any worsening chest pain, difficulty breathing, vomiting or any other worsening concerning symptoms.

## 2019-05-18 NOTE — ED Notes (Signed)
Pt ambulated at bedside, O2 stayed 97-99%, WOB increased. Pt tachypneic during and after ambulating.

## 2019-05-23 ENCOUNTER — Inpatient Hospital Stay (INDEPENDENT_AMBULATORY_CARE_PROVIDER_SITE_OTHER): Payer: Self-pay | Admitting: Primary Care

## 2019-05-28 NOTE — Progress Notes (Deleted)
Cardiology Office Note:    Date:  05/28/2019   ID:  [name], DOB 1961/01/01, MRN [id number]  PCP:  Patient, No Pcp Per  Cardiologist:  Peter Swaziland, MD   Referring MD: No ref. provider found   No chief complaint on file. ***  History of Present Illness:    Francisco [name] is a 59 y.o. male with a hx of COPD, CAD status post PCI 5 years ago in [place], medication noncompliance, hep C not treated per notes, IVDU previously heroin now meth, tobacco use. He was seen in the ER on 04/08/2019 with increasing shortness of breath for 3 days and a nonproductive cough.  He was treated for COPD exacerbation and discharged on prednisone.  He came to the ER on 04/25/2019 with a left without being seen.  He was admitted 04/28/2019 with chest pain and shortness of breath.  Echocardiogram revealed new EF of 25 to 30% and grade 3 diastolic dysfunction.  He was started on beta-blocker, spironolactone, and lisinopril.  He was not started on Entresto due to cost and medication adherence.  He underwent right and left heart cath elevated cardiac enzymes.  He had moderate but nonobstructive disease in multiple vessels.  Medical therapy was recommended.  He was discharged on 05/02/19.  Unfortunately he presented back to the ER on 05/05/2019 with complaints of dyspnea, cold hands and feet, and feeling unwell.  Cardiology was consulted.  He reported medication compliance.  Work-up was unremarkable chest x-ray without edema, BNP in the 200s, which was lower than discharge.  BP was poorly controlled with systolic in the 180s and diastolic in the 120s.  He was euvolemic on exam.  Carvedilol was increased to 37.5 mg twice daily and he was discharged from the ER. I saw him in follow up in clinic on 05/08/19. He continued to report chest pain and DOE. He was trying to quit smoking. He lives at a "sober house." He can't afford the inhalers than had been prescribed and he did not have a PCP. Gave him information for community health and  wellness and refilled his duoneb - he was unable to afford dulera). He unfortunately presented back to the ER again on 05/18/19 with chest pain and DOE. He reported not being able to fill the duonebs I ordered for him. He had also not filled his imdur. Troponin x 2 negative and BNP was 212. He was discharged with a short course of steroids and refills for his inhalers.   He presents today for follow-up.       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  BMP today    Hypertension - medications as above   Nonobstructive CAD - Continue aspirin and 80 mg Lipitor   Medication compliance - needs PCP and patient assistance       Past Medical History:  Diagnosis Date  . COPD (chronic obstructive pulmonary disease) (HCC)   . Coronary artery disease    a. s/p prior PCI.  Marland Kitchen Hepatitis-C   . Hypertension   . IV drug abuse ([facility])    a. 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  . Medically noncompliant   . MI (myocardial infarction) ([facility])   . Tobacco abuse     Past Surgical History:  Procedure Laterality Date  . BACK SURGERY    . NECK SURGERY    . RIGHT/LEFT HEART CATH AND CORONARY ANGIOGRAPHY N/A 04/30/2019   Procedure: RIGHT/LEFT HEART CATH AND CORONARY ANGIOGRAPHY;  Surgeon: Marykay Lex, MD;  Location: [facility];  Service: Cardiovascular;  Laterality: N/A;  . stints      Current Medications: No outpatient medications have been marked as taking for the 05/31/19 encounter (Appointment) with Marcelino Duster, PA.     Allergies:   Ivp dye [iodinated diagnostic agents] and Tramadol   Social History   Socioeconomic History  . Marital status: Divorced    Spouse name:  Not on file  . Number of children: Not on file  . Years of education: Not on file  . Highest education level: Not on file  Occupational History  . Not on file  Tobacco Use  . Smoking status: Current Every Day Smoker    Packs/day: 0.75    Types: Cigarettes  . Smokeless tobacco: Never Used  Substance and Sexual Activity  . Alcohol use: Never  . Drug use: Yes    Types: Methamphetamines    Comment: used meth and heroin in the past  . Sexual activity: Not on file  Other Topics Concern  . Not on file  Social History Narrative  . Not on file   Social Determinants of Health   Financial Resource Strain:   . Difficulty of Paying Living Expenses: Not on file  Food Insecurity:   . Worried About Programme researcher, broadcasting/film/video in the Last Year: Not on file  . Ran Out of Food in the Last Year: Not on file  Transportation Needs:   . Lack of Transportation (Medical): Not on file  . Lack of Transportation (Non-Medical): Not on file  Physical Activity:   . Days of Exercise per Week: Not on file  . Minutes of Exercise per Session: Not on file  Stress:   . Feeling of Stress : Not on file  Social Connections:   . Frequency of Communication with Friends and Family: Not on file  . Frequency of Social Gatherings with Friends and Family: Not on file  . Attends Religious Services: Not on file  . Active Member of Clubs or Organizations: Not on file  . Attends Banker Meetings: Not on file  . Marital Status: Not on file     Family History: The patient's ***family history includes Rheum arthritis in his mother.  ROS:   Please see the history of present illness.    *** All other systems reviewed and are negative.  EKGs/Labs/Other Studies Reviewed:    The following studies were reviewed today: ***  EKG:  EKG is *** ordered today.  The ekg ordered today demonstrates ***  Recent Labs: 05/02/2019: TSH 2.758 05/18/2019: ALT 320; B Natriuretic Peptide 212.1; BUN 14; Creatinine, Ser 1.07;  Hemoglobin 16.5; Platelets 208; Potassium 3.8; Sodium 140  Recent Lipid Panel    Component Value Date/Time   CHOL 215 (H) 04/29/2019 0303   TRIG 57 04/29/2019 0303   HDL 93 04/29/2019 0303   CHOLHDL 2.3 04/29/2019 0303   VLDL 11 04/29/2019 0303   LDLCALC 111 (H) 04/29/2019 0303    Physical Exam:    VS:  There were no vitals taken for this visit.    Wt Readings from Last 3 Encounters:  05/08/19 185 lb (83.9 kg)  05/05/19 182 lb 15.7 oz (83 kg)  05/02/19 183 lb (83 kg)  GEN: *** Well nourished, well developed in no acute distress HEENT: Normal NECK: No JVD; No carotid bruits LYMPHATICS: No lymphadenopathy CARDIAC: ***RRR, no murmurs, rubs, gallops RESPIRATORY:  Clear to auscultation without rales, wheezing or rhonchi  ABDOMEN: Soft, non-tender, non-distended MUSCULOSKELETAL:  No edema; No deformity  SKIN: Warm and dry NEUROLOGIC:  Alert and oriented x 3 PSYCHIATRIC:  Normal affect   ASSESSMENT:    No diagnosis found. PLAN:    In order of problems listed above:  No diagnosis found.   Medication Adjustments/Labs and Tests Ordered: Current medicines are reviewed at length with the patient today.  Concerns regarding medicines are outlined above.  No orders of the defined types were placed in this encounter.  No orders of the defined types were placed in this encounter.   Signed, Ledora Bottcher, PA  05/28/2019 4:01 PM    [facility]

## 2019-05-31 ENCOUNTER — Ambulatory Visit: Payer: Self-pay | Admitting: Physician Assistant

## 2019-07-09 ENCOUNTER — Encounter: Payer: Self-pay | Admitting: Physician Assistant

## 2019-07-23 ENCOUNTER — Other Ambulatory Visit: Payer: Self-pay

## 2019-07-23 ENCOUNTER — Encounter (HOSPITAL_COMMUNITY): Payer: Self-pay

## 2019-07-23 ENCOUNTER — Emergency Department (HOSPITAL_COMMUNITY): Payer: Self-pay

## 2019-07-23 ENCOUNTER — Emergency Department (HOSPITAL_COMMUNITY)
Admission: EM | Admit: 2019-07-23 | Discharge: 2019-07-23 | Disposition: A | Discharge status: Home / Self Care | Payer: Self-pay | Attending: Emergency Medicine | Admitting: Emergency Medicine

## 2019-07-23 DIAGNOSIS — R0789 Other chest pain: Secondary | ICD-10-CM | POA: Insufficient documentation

## 2019-07-23 DIAGNOSIS — R0602 Shortness of breath: Secondary | ICD-10-CM | POA: Insufficient documentation

## 2019-07-23 DIAGNOSIS — Z5321 Procedure and treatment not carried out due to patient leaving prior to being seen by health care provider: Secondary | ICD-10-CM | POA: Insufficient documentation

## 2019-07-23 LAB — BASIC METABOLIC PANEL
Anion gap: 12 (ref 5–15)
BUN: 12 mg/dL (ref 6–20)
CO2: 25 mmol/L (ref 22–32)
Calcium: 9.5 mg/dL (ref 8.9–10.3)
Chloride: 97 mmol/L — ABNORMAL LOW (ref 98–111)
Creatinine, Ser: 0.98 mg/dL (ref 0.61–1.24)
GFR calc Af Amer: >60 mL/min (ref >60)
GFR calc non Af Amer: >60 mL/min (ref >60)
Glucose, Bld: 105 mg/dL — ABNORMAL HIGH (ref 70–99)
Potassium: 4.1 mmol/L (ref 3.5–5.1)
Sodium: 134 mmol/L — ABNORMAL LOW (ref 135–145)

## 2019-07-23 LAB — CBC
HCT: 51.7 % (ref 39.0–52.0)
Hemoglobin: 17.2 g/dL — ABNORMAL HIGH (ref 13.0–17.0)
MCH: 32 pg (ref 26.0–34.0)
MCHC: 33.3 g/dL (ref 30.0–36.0)
MCV: 96.1 fL (ref 80.0–100.0)
Platelets: 252 10*3/uL (ref 150–400)
RBC: 5.38 MIL/uL (ref 4.22–5.81)
RDW: 12.9 % (ref 11.5–15.5)
WBC: 6.4 10*3/uL (ref 4.0–10.5)
nRBC: 0 % (ref 0.0–0.2)

## 2019-07-23 LAB — TROPONIN I (HIGH SENSITIVITY): Troponin I (High Sensitivity): 31 ng/L — ABNORMAL HIGH (ref <18)

## 2019-07-23 MED ORDER — SODIUM CHLORIDE 0.9% FLUSH: 3.0000 mL | Freq: Once | INTRAVENOUS | Status: DC

## 2019-07-23 NOTE — ED Triage Notes (Signed)
Pt arrives to ED w/ c/o chest pain and SOB x 1 week. Pt describes pain as tightness, 8/10 pain.

## 2019-08-17 ENCOUNTER — Inpatient Hospital Stay (HOSPITAL_COMMUNITY)
Admission: EM | Admit: 2019-08-17 | Discharge: 2019-08-20 | DRG: 291 | Disposition: A | Discharge status: Home / Self Care | Payer: Self-pay | Attending: Family Medicine | Admitting: Family Medicine

## 2019-08-17 ENCOUNTER — Emergency Department (HOSPITAL_COMMUNITY): Payer: Self-pay

## 2019-08-17 ENCOUNTER — Encounter (HOSPITAL_COMMUNITY): Payer: Self-pay | Admitting: Emergency Medicine

## 2019-08-17 ENCOUNTER — Other Ambulatory Visit: Payer: Self-pay

## 2019-08-17 DIAGNOSIS — J9601 Acute respiratory failure with hypoxia: Secondary | ICD-10-CM | POA: Diagnosis not present

## 2019-08-17 DIAGNOSIS — N179 Acute kidney failure, unspecified: Secondary | ICD-10-CM | POA: Diagnosis not present

## 2019-08-17 DIAGNOSIS — F1510 Other stimulant abuse, uncomplicated: Secondary | ICD-10-CM

## 2019-08-17 DIAGNOSIS — I5041 Acute combined systolic (congestive) and diastolic (congestive) heart failure: Secondary | ICD-10-CM | POA: Diagnosis present

## 2019-08-17 DIAGNOSIS — Z9119 Patient's noncompliance with other medical treatment and regimen: Secondary | ICD-10-CM

## 2019-08-17 DIAGNOSIS — Z885 Allergy status to narcotic agent status: Secondary | ICD-10-CM

## 2019-08-17 DIAGNOSIS — Z91120 Patient's intentional underdosing of medication regimen due to financial hardship: Secondary | ICD-10-CM

## 2019-08-17 DIAGNOSIS — I509 Heart failure, unspecified: Secondary | ICD-10-CM

## 2019-08-17 DIAGNOSIS — I420 Dilated cardiomyopathy: Secondary | ICD-10-CM | POA: Diagnosis present

## 2019-08-17 DIAGNOSIS — Z20822 Contact with and (suspected) exposure to covid-19: Secondary | ICD-10-CM | POA: Diagnosis present

## 2019-08-17 DIAGNOSIS — I959 Hypotension, unspecified: Secondary | ICD-10-CM | POA: Diagnosis present

## 2019-08-17 DIAGNOSIS — Z8619 Personal history of other infectious and parasitic diseases: Secondary | ICD-10-CM

## 2019-08-17 DIAGNOSIS — F17210 Nicotine dependence, cigarettes, uncomplicated: Secondary | ICD-10-CM | POA: Diagnosis present

## 2019-08-17 DIAGNOSIS — R7989 Other specified abnormal findings of blood chemistry: Secondary | ICD-10-CM | POA: Diagnosis present

## 2019-08-17 DIAGNOSIS — Z8261 Family history of arthritis: Secondary | ICD-10-CM

## 2019-08-17 DIAGNOSIS — F1590 Other stimulant use, unspecified, uncomplicated: Secondary | ICD-10-CM | POA: Diagnosis present

## 2019-08-17 DIAGNOSIS — I2721 Secondary pulmonary arterial hypertension: Secondary | ICD-10-CM | POA: Diagnosis present

## 2019-08-17 DIAGNOSIS — I252 Old myocardial infarction: Secondary | ICD-10-CM

## 2019-08-17 DIAGNOSIS — I5043 Acute on chronic combined systolic (congestive) and diastolic (congestive) heart failure: Secondary | ICD-10-CM | POA: Diagnosis present

## 2019-08-17 DIAGNOSIS — Z91041 Radiographic dye allergy status: Secondary | ICD-10-CM

## 2019-08-17 DIAGNOSIS — R001 Bradycardia, unspecified: Secondary | ICD-10-CM | POA: Diagnosis not present

## 2019-08-17 DIAGNOSIS — R578 Other shock: Secondary | ICD-10-CM | POA: Diagnosis not present

## 2019-08-17 DIAGNOSIS — I2510 Atherosclerotic heart disease of native coronary artery without angina pectoris: Secondary | ICD-10-CM

## 2019-08-17 DIAGNOSIS — F15129 Other stimulant abuse with intoxication, unspecified: Secondary | ICD-10-CM | POA: Diagnosis present

## 2019-08-17 DIAGNOSIS — Y831 Surgical operation with implant of artificial internal device as the cause of abnormal reaction of the patient, or of later complication, without mention of misadventure at the time of the procedure: Secondary | ICD-10-CM | POA: Diagnosis present

## 2019-08-17 DIAGNOSIS — I110 Hypertensive heart disease with heart failure: Principal | ICD-10-CM | POA: Diagnosis present

## 2019-08-17 DIAGNOSIS — T82855A Stenosis of coronary artery stent, initial encounter: Secondary | ICD-10-CM | POA: Diagnosis present

## 2019-08-17 DIAGNOSIS — I428 Other cardiomyopathies: Secondary | ICD-10-CM | POA: Diagnosis present

## 2019-08-17 DIAGNOSIS — E872 Acidosis: Secondary | ICD-10-CM | POA: Diagnosis not present

## 2019-08-17 DIAGNOSIS — Z955 Presence of coronary angioplasty implant and graft: Secondary | ICD-10-CM

## 2019-08-17 DIAGNOSIS — G92 Toxic encephalopathy: Secondary | ICD-10-CM | POA: Diagnosis not present

## 2019-08-17 DIAGNOSIS — J441 Chronic obstructive pulmonary disease with (acute) exacerbation: Secondary | ICD-10-CM | POA: Diagnosis not present

## 2019-08-17 DIAGNOSIS — T50916A Underdosing of multiple unspecified drugs, medicaments and biological substances, initial encounter: Secondary | ICD-10-CM | POA: Diagnosis present

## 2019-08-17 DIAGNOSIS — Z9861 Coronary angioplasty status: Secondary | ICD-10-CM

## 2019-08-17 DIAGNOSIS — Y712 Prosthetic and other implants, materials and accessory cardiovascular devices associated with adverse incidents: Secondary | ICD-10-CM | POA: Diagnosis present

## 2019-08-17 DIAGNOSIS — R778 Other specified abnormalities of plasma proteins: Secondary | ICD-10-CM

## 2019-08-17 LAB — RESPIRATORY PANEL BY RT PCR (FLU A&B, COVID)
Influenza A by PCR: NEGATIVE
Influenza B by PCR: NEGATIVE
SARS Coronavirus 2 by RT PCR: NEGATIVE

## 2019-08-17 LAB — BASIC METABOLIC PANEL
Anion gap: 12 (ref 5–15)
BUN: 12 mg/dL (ref 6–20)
CO2: 22 mmol/L (ref 22–32)
Calcium: 9 mg/dL (ref 8.9–10.3)
Chloride: 99 mmol/L (ref 98–111)
Creatinine, Ser: 0.91 mg/dL (ref 0.61–1.24)
GFR calc Af Amer: >60 mL/min (ref >60)
GFR calc non Af Amer: >60 mL/min (ref >60)
Glucose, Bld: 105 mg/dL — ABNORMAL HIGH (ref 70–99)
Potassium: 4 mmol/L (ref 3.5–5.1)
Sodium: 133 mmol/L — ABNORMAL LOW (ref 135–145)

## 2019-08-17 LAB — CBC
HCT: 48.5 % (ref 39.0–52.0)
Hemoglobin: 16.1 g/dL (ref 13.0–17.0)
MCH: 31.6 pg (ref 26.0–34.0)
MCHC: 33.2 g/dL (ref 30.0–36.0)
MCV: 95.1 fL (ref 80.0–100.0)
Platelets: 241 10*3/uL (ref 150–400)
RBC: 5.1 MIL/uL (ref 4.22–5.81)
RDW: 12.3 % (ref 11.5–15.5)
WBC: 9.2 10*3/uL (ref 4.0–10.5)
nRBC: 0 % (ref 0.0–0.2)

## 2019-08-17 LAB — BRAIN NATRIURETIC PEPTIDE: B Natriuretic Peptide: 271.1 pg/mL — ABNORMAL HIGH (ref 0.0–100.0)

## 2019-08-17 LAB — TROPONIN I (HIGH SENSITIVITY)
Troponin I (High Sensitivity): 44 ng/L — ABNORMAL HIGH (ref <18)
Troponin I (High Sensitivity): 44 ng/L — ABNORMAL HIGH (ref <18)

## 2019-08-17 MED ORDER — SODIUM CHLORIDE 0.9 % IV SOLN: 250.0000 mL | INTRAVENOUS | Status: DC | PRN

## 2019-08-17 MED ORDER — ONDANSETRON HCL 4 MG/2ML IJ SOLN: 4.0000 mg | Freq: Four times a day (QID) | INTRAMUSCULAR | Status: DC | PRN

## 2019-08-17 MED ORDER — MOMETASONE FURO-FORMOTEROL FUM 200-5 MCG/ACT IN AERO: 2.0000 | INHALATION_SPRAY | Freq: Two times a day (BID) | RESPIRATORY_TRACT | Status: DC

## 2019-08-17 MED ORDER — CELECOXIB 200 MG PO CAPS
200.0000 mg | ORAL_CAPSULE | Freq: Two times a day (BID) | ORAL | Status: DC
  Administered 2019-08-17: 200 mg via ORAL
  Filled 2019-08-17 (×2): qty 1

## 2019-08-17 MED ORDER — ASPIRIN EC 81 MG PO TBEC
81.0000 mg | DELAYED_RELEASE_TABLET | Freq: Every day | ORAL | Status: DC
  Administered 2019-08-19 – 2019-08-20 (×2): 81 mg via ORAL
  Filled 2019-08-17 (×2): qty 1

## 2019-08-17 MED ORDER — ACETAMINOPHEN 325 MG PO TABS: 650.0000 mg | ORAL_TABLET | ORAL | Status: DC | PRN

## 2019-08-17 MED ORDER — NICOTINE 14 MG/24HR TD PT24: 14.0000 mg | MEDICATED_PATCH | Freq: Every day | TRANSDERMAL | Status: DC

## 2019-08-17 MED ORDER — LISINOPRIL 20 MG PO TABS: 20.0000 mg | ORAL_TABLET | Freq: Every day | ORAL | Status: DC

## 2019-08-17 MED ORDER — IPRATROPIUM-ALBUTEROL 0.5-2.5 (3) MG/3ML IN SOLN
3.0000 mL | Freq: Three times a day (TID) | RESPIRATORY_TRACT | Status: DC
  Administered 2019-08-18 – 2019-08-19 (×4): 3 mL via RESPIRATORY_TRACT
  Filled 2019-08-17 (×4): qty 3

## 2019-08-17 MED ORDER — ALBUTEROL SULFATE (2.5 MG/3ML) 0.083% IN NEBU
3.0000 mL | INHALATION_SOLUTION | Freq: Four times a day (QID) | RESPIRATORY_TRACT | Status: DC | PRN
  Administered 2019-08-18: 3 mL via RESPIRATORY_TRACT
  Filled 2019-08-17: qty 3

## 2019-08-17 MED ORDER — NITROGLYCERIN 2 % TD OINT
1.0000 [in_us] | TOPICAL_OINTMENT | Freq: Once | TRANSDERMAL | Status: AC
  Administered 2019-08-17: 1 [in_us] via TOPICAL
  Filled 2019-08-17: qty 1

## 2019-08-17 MED ORDER — SPIRONOLACTONE 12.5 MG HALF TABLET: 12.5000 mg | ORAL_TABLET | Freq: Every day | ORAL | Status: DC

## 2019-08-17 MED ORDER — ISOSORBIDE MONONITRATE ER 30 MG PO TB24: 30.0000 mg | ORAL_TABLET | Freq: Every day | ORAL | Status: DC

## 2019-08-17 MED ORDER — SODIUM CHLORIDE 0.9% FLUSH
3.0000 mL | Freq: Once | INTRAVENOUS | Status: AC
  Administered 2019-08-17: 3 mL via INTRAVENOUS

## 2019-08-17 MED ORDER — ENOXAPARIN SODIUM 40 MG/0.4ML ~~LOC~~ SOLN
40.0000 mg | SUBCUTANEOUS | Status: DC
  Filled 2019-08-17 (×2): qty 0.4

## 2019-08-17 MED ORDER — SODIUM CHLORIDE 0.9% FLUSH
3.0000 mL | Freq: Two times a day (BID) | INTRAVENOUS | Status: DC
  Administered 2019-08-17 – 2019-08-20 (×4): 3 mL via INTRAVENOUS

## 2019-08-17 MED ORDER — FUROSEMIDE 10 MG/ML IJ SOLN
40.0000 mg | Freq: Once | INTRAMUSCULAR | Status: AC
  Administered 2019-08-17: 40 mg via INTRAVENOUS
  Filled 2019-08-17: qty 4

## 2019-08-17 MED ORDER — ATORVASTATIN CALCIUM 80 MG PO TABS
80.0000 mg | ORAL_TABLET | Freq: Every day | ORAL | Status: DC
  Administered 2019-08-17 – 2019-08-19 (×3): 80 mg via ORAL
  Filled 2019-08-17 (×3): qty 1

## 2019-08-17 MED ORDER — CARVEDILOL 12.5 MG PO TABS
37.5000 mg | ORAL_TABLET | Freq: Two times a day (BID) | ORAL | Status: DC
  Administered 2019-08-17: 37.5 mg via ORAL
  Filled 2019-08-17: qty 3

## 2019-08-17 MED ORDER — SODIUM CHLORIDE 0.9% FLUSH: 3.0000 mL | INTRAVENOUS | Status: DC | PRN

## 2019-08-17 MED ORDER — FUROSEMIDE 20 MG PO TABS: 40.0000 mg | ORAL_TABLET | Freq: Every day | ORAL | Status: DC

## 2019-08-17 NOTE — ED Triage Notes (Addendum)
Patient reports SOB and chest tightness onset today , denies fever or chills , history of COPD/CHF/CAD . Patient added he smoked methamphetamine today .

## 2019-08-17 NOTE — H&P (Addendum)
History and Physical    Francisco [name] IZT:[id number] DOB: [birth year] DOA: 08/17/2019  PCP: Patient, No Pcp Per  Patient coming from: Home  I have personally briefly reviewed patient's old medical records in [facility]  Chief Complaint: CP / SOB  HPI: Francisco [name] is a 59 y.o. male with medical history significant of CAD s/p stent, NICM with EF 25% in Jan, repeat cath showed 50% or less lesions, COPD, HTN, polysubstance abuse (smokes methamphetamine now).  He presents today for evaluation of shortness of breath and chest tightness.  This has been worsening all week, but became worse today after smoking methamphetamine.  He denies fevers or chills, but does have some chest congestion.  Patient tells me he has been off of all his medications for several months related to insurance/financial reasons.  He has not been back to see his cardiologist since having a heart cath in January 2021.   ED Course: CXR neg, Trop 44 (was 31 earlier this month and neg in Jan).  S.Tach 120s, BP 160s systolic, satting low 90s on RA.  Given 40mg  IV lasix and NTG paste -> good UOP, CP resolved currently per patient, still tachy at 120+, BP 148 systolic currently.  Repeat trop unchanged (also 44).  COVID pending.   Review of Systems: As per HPI, otherwise all review of systems negative.  Past Medical History:  Diagnosis Date  . COPD (chronic obstructive pulmonary disease) (HCC)   . Coronary artery disease    a. s/p prior PCI.  Hepatitis-C   . Hypertension   . IV drug abuse (HCC)    a. 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  . Medically noncompliant   . MI (myocardial infarction) (HCC)   . Tobacco abuse     Past Surgical History:  Procedure Laterality Date  . BACK SURGERY    . NECK SURGERY    . RIGHT/LEFT HEART CATH AND CORONARY ANGIOGRAPHY N/A 04/30/2019   Procedure: RIGHT/LEFT HEART CATH AND CORONARY ANGIOGRAPHY;  Surgeon: 06/28/2019, MD;  Location: [facility] INVASIVE CV LAB;   Service: Cardiovascular;  Laterality: N/A;  . stints       reports that he has been smoking cigarettes. He has been smoking about 0.75 packs per day. He has never used smokeless tobacco. He reports current drug use. Drug: Methamphetamines. He reports that he does not drink alcohol.  Allergies  Allergen Reactions  . Ivp Dye [Iodinated Diagnostic Agents] Other (See Comments)    seizures  . Tramadol Other (See Comments)    seizures    Family History  Problem Relation Age of Onset  . Rheum arthritis Mother      Prior to Admission medications   Medication Sig Start Date End Date Taking? Authorizing Provider  albuterol (VENTOLIN HFA) 108 (90 Base) MCG/ACT inhaler Inhale 1-2 puffs into the lungs every 6 (six) hours as needed for wheezing or shortness of breath. 04/16/19  Yes 04/18/19, MD  aspirin EC 81 MG tablet Take 81 mg by mouth daily.   Yes [provider]  nitroGLYCERIN (NITROSTAT) 0.4 MG SL tablet Place 1 tablet (0.4 mg total) under the tongue every 5 (five) minutes x 3 doses as needed for chest pain. 05/02/19  Yes Barrett, 05/04/19, PA-C  atorvastatin (LIPITOR) 80 MG tablet Take 1 tablet (80 mg total) by mouth daily at 6 PM. Patient not taking: Reported on 08/17/2019 05/02/19   Barrett, 05/04/19, PA-C  carvedilol (COREG) 25 MG tablet Take 1.5 tablets (37.5 mg  total) by mouth 2 (two) times daily with a meal. Patient not taking: Reported on 08/17/2019 05/05/19   Charlestine Night, PA-C  celecoxib (CELEBREX) 200 MG capsule Take 1 capsule (200 mg total) by mouth 2 (two) times daily. Patient not taking: Reported on 08/17/2019 05/02/19   Barrett, Joline Salt, PA-C  furosemide (LASIX) 40 MG tablet Take 1 tablet (40 mg total) by mouth daily. Patient not taking: Reported on 08/17/2019 05/02/19   Barrett, Joline Salt, PA-C  ipratropium-albuterol (DUONEB) 0.5-2.5 (3) MG/3ML SOLN Take 3 mLs by nebulization 3 (three) times daily. Patient not taking: Reported on 08/17/2019 05/08/19   Marcelino Duster, PA  isosorbide mononitrate (IMDUR) 30 MG 24 hr tablet Take 1 tablet (30 mg total) by mouth daily. 1/2 tab (15mg ) for one week the whole tab(30mg ) therafter Patient not taking: Reported on 08/17/2019 05/08/19 08/06/19  08/08/19, PA  lisinopril (ZESTRIL) 20 MG tablet Take 1 tablet (20 mg total) by mouth daily. Patient not taking: Reported on 08/17/2019 05/03/19   Barrett, 05/05/19, PA-C  mometasone-formoterol (DULERA) 200-5 MCG/ACT AERO Inhale 2 puffs into the lungs 2 (two) times daily. Patient not taking: Reported on 08/17/2019 06/19/18   08/19/18, MD  nicotine (NICODERM CQ - DOSED IN MG/24 HOURS) 14 mg/24hr patch Place 1 patch (14 mg total) onto the skin daily. Patient not taking: Reported on 05/18/2019 06/20/18   08/20/18, MD  spironolactone (ALDACTONE) 25 MG tablet Take 0.5 tablets (12.5 mg total) by mouth daily. Patient not taking: Reported on 05/18/2019 05/03/19   05/05/19    Physical Exam: Vitals:   08/17/19 2145 08/17/19 2215 08/17/19 2248 08/17/19 2300  BP: (!) 161/93   (!) 142/97  Pulse: (!) 121 (!) 110 (!) 124 (!) 116  Resp:  20  18  Temp:      TempSrc:      SpO2: 92% 96% 98% 90%  Weight:      Height:        Constitutional: NAD, calm, comfortable Eyes: PERRL, lids and conjunctivae normal ENMT: Mucous membranes are moist. Posterior pharynx clear of any exudate or lesions.Normal dentition.  Neck: normal, supple, no masses, no thyromegaly Respiratory: clear to auscultation bilaterally, no wheezing, no crackles. Normal respiratory effort. No accessory muscle use.  Cardiovascular: Tachycardic Abdomen: no tenderness, no masses palpated. No hepatosplenomegaly. Bowel sounds positive.  Musculoskeletal: no clubbing / cyanosis. No joint deformity upper and lower extremities. Good ROM, no contractures. Normal muscle tone.  Skin: no rashes, lesions, ulcers. No induration Neurologic: CN 2-12 grossly intact. Sensation intact, DTR normal. Strength 5/5  in all 4.  Psychiatric: Normal judgment and insight. Alert and oriented x 3. Normal mood.    Labs on Admission: I have personally reviewed following labs and imaging studies  CBC: Recent Labs  Lab 08/17/19 2027  WBC 9.2  HGB 16.1  HCT 48.5  MCV 95.1  PLT 241   Basic Metabolic Panel: Recent Labs  Lab 08/17/19 2027  NA 133*  K 4.0  CL 99  CO2 22  GLUCOSE 105*  BUN 12  CREATININE 0.91  CALCIUM 9.0   GFR: Estimated Creatinine Clearance: 97.1 mL/min (by C-G formula based on SCr of 0.91 mg/dL). Liver Function Tests: No results for input(s): AST, ALT, ALKPHOS, BILITOT, PROT, ALBUMIN in the last 168 hours. No results for input(s): LIPASE, AMYLASE in the last 168 hours. No results for input(s): AMMONIA in the last 168 hours. Coagulation Profile: No results for input(s): INR, PROTIME in the  last 168 hours. Cardiac Enzymes: No results for input(s): CKTOTAL, CKMB, CKMBINDEX, TROPONINI in the last 168 hours. BNP (last 3 results) No results for input(s): PROBNP in the last 8760 hours. HbA1C: No results for input(s): HGBA1C in the last 72 hours. CBG: No results for input(s): GLUCAP in the last 168 hours. Lipid Profile: No results for input(s): CHOL, HDL, LDLCALC, TRIG, CHOLHDL, LDLDIRECT in the last 72 hours. Thyroid Function Tests: No results for input(s): TSH, T4TOTAL, FREET4, T3FREE, THYROIDAB in the last 72 hours. Anemia Panel: No results for input(s): VITAMINB12, FOLATE, FERRITIN, TIBC, IRON, RETICCTPCT in the last 72 hours. Urine analysis:    Component Value Date/Time   COLORURINE YELLOW 06/16/2018 2222   APPEARANCEUR HAZY (A) 06/16/2018 2222   LABSPEC 1.017 06/16/2018 2222   PHURINE 7.0 06/16/2018 2222   GLUCOSEU NEGATIVE 06/16/2018 2222   HGBUR NEGATIVE 06/16/2018 2222   BILIRUBINUR NEGATIVE 06/16/2018 2222   KETONESUR NEGATIVE 06/16/2018 2222   PROTEINUR NEGATIVE 06/16/2018 2222   NITRITE NEGATIVE 06/16/2018 2222   LEUKOCYTESUR NEGATIVE 06/16/2018 2222     Radiological Exams on Admission: DG Chest 2 View  Result Date: 08/17/2019 CLINICAL DATA:  Shortness of breath and chest tightness beginning today. EXAM: CHEST - 2 VIEW COMPARISON:  07/23/2019 FINDINGS: Lungs are adequately inflated and otherwise clear. Flattening of the hemidiaphragms on the lateral film. Cardiomediastinal silhouette and remainder the exam is unchanged. IMPRESSION: No active cardiopulmonary disease. Electronically Signed   By: Marin Olp M.D.   On: 08/17/2019 20:48    EKG: Independently reviewed.  Assessment/Plan Principal Problem:   Acute on chronic combined systolic (congestive) and diastolic (congestive) heart failure (HCC) Active Problems:   Elevated troponin   Dilated cardiomyopathy (HCC)   CAD S/P percutaneous coronary angioplasty   Methamphetamine abuse ([place])    1. Acute combined systolic and diastolic CHF - 1. Due to not taking home meds for a month and smoking meth 2. CHF pathway 3. Got lasix 40mg  IV in ED 4. CXR without pulm edema 5. At this point, I really just want to see how patient does on his home meds hes supposed to be on: 1. Therefore will re-order home meds 2. First dose of coreg, now for the tachycardia 3. Per med rec was on 37.5mg  BID coreg: looks like he was discharged on 25mg  BID coreg from hospital in Jan and this was increased by Dr. Debara Pickett (Consult note 05/05/19) to 37.5mg  BID) 6. Tele monitor 7. Repeat BMP in AM 8. 2d echo 2. CAD, Elevated trop - 1. Repeat trop flat 2. LHC in Jan with non-occlusive dz 3. Resume home meds 3. HTN - resume home meds 4. Methamphetamine abuse - with intoxication 1. Coreg now as above 2. Tele monitor  DVT prophylaxis: Lovenox Code Status: Full Family Communication: No family in room Disposition Plan: Home after stable from cardiac standpoint Consults called: none Admission status: Place in 80    Francisco [name], [place] Hospitalists  How to contact the [facility] Attending or Consulting  provider [place] or covering provider during after hours [place], for this patient?  1. Check the care team in [facility] and look for a) attending/consulting TR[facility] provider listed and b) the [facility] team listed 2. Log into [email or web address]  Amion Physician Scheduling and messaging for groups and whole hospitals  On call and physician scheduling software for group practices, residents, hospitalists and other medical providers for call, clinic, rotation and shift schedules. OnCall [employer] is a hospital-wide system for scheduling doctors and  paging doctors on call. EasyPlot is for scientific plotting and data analysis.  [email or web address]  and use [place]'s universal password to access. If you do not have the password, please contact the hospital operator.  3. Locate the [facility] provider you are looking for under [facility] and page to a number that you can be directly reached. 4. If you still have difficulty reaching the provider, please page the [facility] (Director on Call) for the Hospitalists listed on amion for assistance.  08/17/2019, 11:20 PM

## 2019-08-17 NOTE — ED Notes (Signed)
Pt was noted on the monitor to have an O2 saturation of 87% with a good pleth. Pt was placed on a nasal cannula at 2LPM initially with no changed. Bumped pt up to 3L and saturation improved to 94%.   [place], RN at bedside and aware.

## 2019-08-17 NOTE — ED Provider Notes (Signed)
[place] EMERGENCY DEPARTMENT Provider Note   CSN: [id number] Arrival date & time: 08/17/19  1958     History Chief Complaint  Patient presents with  . Shortness of Breath    CHF/FCOPD    Francisco [name] is a 59 y.o. male.  Patient is a 60 year old male with extensive past medical history including coronary artery disease with stent, dilated cardiomyopathy with significantly reduced EF, COPD, hypertension, polysubstance abuse.  He presents today for evaluation of shortness of breath and chest tightness.  He tells me this has been worsening all week, but became worse today after smoking methamphetamine.  He denies fevers or chills, but does have some chest congestion.  Patient tells me he has been off of all his medications for several months related to insurance/financial reasons.  He has not been back to see his cardiologist since having a heart cath in January 2021.  The history is provided by the patient.  Shortness of Breath Severity:  Moderate Onset quality:  Gradual Duration:  1 week Timing:  Constant Progression:  Worsening Chronicity:  New Context: activity   Relieved by:  Rest Worsened by:  Exertion Ineffective treatments:  None tried Associated symptoms: chest pain and cough   Associated symptoms: no fever        Past Medical History:  Diagnosis Date  . COPD (chronic obstructive pulmonary disease) ([place])   . Coronary artery disease    a. s/p prior PCI.  Marland Kitchen Hepatitis-C   . Hypertension   . IV drug abuse ([place])    a. 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  . Medically noncompliant   . MI (myocardial infarction) ([place])   . Tobacco abuse     Patient Active Problem List   Diagnosis Date Noted  . Chronic obstructive pulmonary disease ([place])   . Elevated troponin   . Unstable angina ([place])   . Dilated cardiomyopathy ([place])   . CAD S/P percutaneous coronary angioplasty   . Acute combined systolic and diastolic heart failure ([place])   .  ACS (acute coronary syndrome) ([place]) 04/29/2019  . Acute respiratory failure with hypoxia ([place]) 06/17/2018    Past Surgical History:  Procedure Laterality Date  . BACK SURGERY    . NECK SURGERY    . RIGHT/LEFT HEART CATH AND CORONARY ANGIOGRAPHY N/A 04/30/2019   Procedure: RIGHT/LEFT HEART CATH AND CORONARY ANGIOGRAPHY;  Surgeon: Leonie Man, MD;  Location: [place] CV LAB;  Service: Cardiovascular;  Laterality: N/A;  . stints         Family History  Problem Relation Age of Onset  . Rheum arthritis Mother     Social History   Tobacco Use  . Smoking status: Current Every Day Smoker    Packs/day: 0.75    Types: Cigarettes  . Smokeless tobacco: Never Used  Substance Use Topics  . Alcohol use: Never  . Drug use: Yes    Types: Methamphetamines    Home Medications Prior to Admission medications   Medication Sig Start Date End Date Taking? Authorizing Provider  albuterol (VENTOLIN HFA) 108 (90 Base) MCG/ACT inhaler Inhale 1-2 puffs into the lungs every 6 (six) hours as needed for wheezing or shortness of breath. 04/16/19  Yes Hayden Rasmussen, MD  aspirin EC 81 MG tablet Take 81 mg by mouth daily.   Yes [provider]  nitroGLYCERIN (NITROSTAT) 0.4 MG SL tablet Place 1 tablet (0.4 mg total) under the tongue every 5 (five) minutes x 3 doses as needed for chest pain.  05/02/19  Yes Barrett, Joline Salt, PA-C  atorvastatin (LIPITOR) 80 MG tablet Take 1 tablet (80 mg total) by mouth daily at 6 PM. Patient not taking: Reported on 08/17/2019 05/02/19   Barrett, Joline Salt, PA-C  carvedilol (COREG) 25 MG tablet Take 1.5 tablets (37.5 mg total) by mouth 2 (two) times daily with a meal. Patient not taking: Reported on 08/17/2019 05/05/19   Charlestine Night, PA-C  celecoxib (CELEBREX) 200 MG capsule Take 1 capsule (200 mg total) by mouth 2 (two) times daily. Patient not taking: Reported on 08/17/2019 05/02/19   Barrett, Joline Salt, PA-C  furosemide (LASIX) 40 MG tablet Take 1 tablet  (40 mg total) by mouth daily. Patient not taking: Reported on 08/17/2019 05/02/19   Barrett, Joline Salt, PA-C  ipratropium-albuterol (DUONEB) 0.5-2.5 (3) MG/3ML SOLN Take 3 mLs by nebulization 3 (three) times daily. Patient not taking: Reported on 08/17/2019 05/08/19   Marcelino Duster, PA  isosorbide mononitrate (IMDUR) 30 MG 24 hr tablet Take 1 tablet (30 mg total) by mouth daily. 1/2 tab (15mg ) for one week the whole tab(30mg ) therafter Patient not taking: Reported on 08/17/2019 05/08/19 08/06/19  08/08/19, PA  lisinopril (ZESTRIL) 20 MG tablet Take 1 tablet (20 mg total) by mouth daily. Patient not taking: Reported on 08/17/2019 05/03/19   Barrett, 05/05/19, PA-C  mometasone-formoterol (DULERA) 200-5 MCG/ACT AERO Inhale 2 puffs into the lungs 2 (two) times daily. Patient not taking: Reported on 08/17/2019 06/19/18   08/19/18, MD  nicotine (NICODERM CQ - DOSED IN MG/24 HOURS) 14 mg/24hr patch Place 1 patch (14 mg total) onto the skin daily. Patient not taking: Reported on 05/18/2019 06/20/18   08/20/18, MD  spironolactone (ALDACTONE) 25 MG tablet Take 0.5 tablets (12.5 mg total) by mouth daily. Patient not taking: Reported on 05/18/2019 05/03/19   Barrett, 05/05/19, PA-C    Allergies    Ivp dye [iodinated diagnostic agents] and Tramadol  Review of Systems   Review of Systems  Constitutional: Negative for fever.  Respiratory: Positive for cough and shortness of breath.   Cardiovascular: Positive for chest pain.  All other systems reviewed and are negative.   Physical Exam Updated Vital Signs BP (!) 156/97   Pulse (!) 117   Temp 98.3 F (36.8 C) (Oral)   Resp 17   Ht 6' (1.829 m)   Wt 90 kg   SpO2 93%   BMI 26.91 kg/m   Physical Exam Vitals and nursing note reviewed.  Constitutional:      General: He is not in acute distress.    Appearance: He is well-developed. He is not diaphoretic.  HENT:     Head: Normocephalic and atraumatic.  Cardiovascular:     Rate and  Rhythm: Normal rate and regular rhythm.     Heart sounds: No murmur. No friction rub.  Pulmonary:     Effort: Pulmonary effort is normal. No respiratory distress.     Breath sounds: Normal breath sounds. No wheezing or rales.  Abdominal:     General: Bowel sounds are normal. There is no distension.     Palpations: Abdomen is soft.     Tenderness: There is no abdominal tenderness.  Musculoskeletal:        General: Normal range of motion.     Cervical back: Normal range of motion and neck supple.     Right lower leg: No tenderness. Edema present.     Left lower leg: No tenderness. Edema present.  Comments: There is trace edema of both lower extremities.  There is no calf tenderness and Homans' sign is absent bilaterally.  Skin:    General: Skin is warm and dry.  Neurological:     Mental Status: He is alert and oriented to person, place, and time.     Coordination: Coordination normal.     ED Results / Procedures / Treatments   Labs (all labs ordered are listed, but only abnormal results are displayed) Labs Reviewed  BASIC METABOLIC PANEL - Abnormal; Notable for the following components:      Result Value   Sodium 133 (*)    Glucose, Bld 105 (*)    All other components within normal limits  BRAIN NATRIURETIC PEPTIDE - Abnormal; Notable for the following components:   B Natriuretic Peptide 271.1 (*)    All other components within normal limits  TROPONIN I (HIGH SENSITIVITY) - Abnormal; Notable for the following components:   Troponin I (High Sensitivity) 44 (*)    All other components within normal limits  CBC  TROPONIN I (HIGH SENSITIVITY)    EKG EKG Interpretation  Date/Time:  Friday August 17 2019 20:09:02 EDT Ventricular Rate:  120 PR Interval:  124 QRS Duration: 82 QT Interval:  328 QTC Calculation: 463 R Axis:   79 Text Interpretation: Sinus tachycardia Right atrial enlargement Borderline ECG Confirmed by Geoffery Lyons ([id number]) on 08/17/2019 9:38:38  PM   Radiology DG Chest 2 View  Result Date: 08/17/2019 CLINICAL DATA:  Shortness of breath and chest tightness beginning today. EXAM: CHEST - 2 VIEW COMPARISON:  07/23/2019 FINDINGS: Lungs are adequately inflated and otherwise clear. Flattening of the hemidiaphragms on the lateral film. Cardiomediastinal silhouette and remainder the exam is unchanged. IMPRESSION: No active cardiopulmonary disease. Electronically Signed   By: Elberta Fortis M.D.   On: 08/17/2019 20:48    Procedures Procedures (including critical care time)  Medications Ordered in ED Medications  sodium chloride flush (NS) 0.9 % injection 3 mL (has no administration in time range)  furosemide (LASIX) injection 40 mg (has no administration in time range)    ED Course  I have reviewed the triage vital signs and the nursing notes.  Pertinent labs & imaging results that were available during my care of the patient were reviewed by me and considered in my medical decision making (see chart for details).    MDM Rules/Calculators/A&P  Patient with history of dilated cardiomyopathy, COPD, coronary artery disease with stent, and medical noncompliance presenting here with complaints of shortness of breath that is worsened over the past week.  He describes dyspnea on exertion and orthopnea.  His symptoms worsened this evening after smoking methamphetamine earlier today.  [name] work-up and exam most consistent with congestive heart failure.  He is hypertensive and persistently tachycardic.  Patient was given IV Lasix and nitroglycerin paste with slight improvement.  Patient is noncompliant with his medications due to insurance/financial reasons.  I have spoken with Dr. Julian Reil from the hospitalist service who will evaluate.  Final Clinical Impression(s) / ED Diagnoses Final diagnoses:  None    Rx / DC Orders ED Discharge Orders    None       Geoffery Lyons, MD 08/17/19 2308

## 2019-08-18 ENCOUNTER — Encounter (HOSPITAL_COMMUNITY): Payer: Self-pay | Admitting: Pulmonary Disease

## 2019-08-18 ENCOUNTER — Inpatient Hospital Stay (HOSPITAL_COMMUNITY): Payer: Self-pay

## 2019-08-18 DIAGNOSIS — N179 Acute kidney failure, unspecified: Secondary | ICD-10-CM

## 2019-08-18 DIAGNOSIS — I5043 Acute on chronic combined systolic (congestive) and diastolic (congestive) heart failure: Secondary | ICD-10-CM

## 2019-08-18 DIAGNOSIS — Z720 Tobacco use: Secondary | ICD-10-CM

## 2019-08-18 DIAGNOSIS — R579 Shock, unspecified: Secondary | ICD-10-CM

## 2019-08-18 DIAGNOSIS — I959 Hypotension, unspecified: Secondary | ICD-10-CM | POA: Diagnosis present

## 2019-08-18 DIAGNOSIS — I5031 Acute diastolic (congestive) heart failure: Secondary | ICD-10-CM

## 2019-08-18 DIAGNOSIS — G9340 Encephalopathy, unspecified: Secondary | ICD-10-CM

## 2019-08-18 LAB — CBC
HCT: 49.8 % (ref 39.0–52.0)
Hemoglobin: 16 g/dL (ref 13.0–17.0)
MCH: 31.3 pg (ref 26.0–34.0)
MCHC: 32.1 g/dL (ref 30.0–36.0)
MCV: 97.3 fL (ref 80.0–100.0)
Platelets: 217 10*3/uL (ref 150–400)
RBC: 5.12 MIL/uL (ref 4.22–5.81)
RDW: 12.4 % (ref 11.5–15.5)
WBC: 11.6 10*3/uL — ABNORMAL HIGH (ref 4.0–10.5)
nRBC: 0 % (ref 0.0–0.2)

## 2019-08-18 LAB — COMPREHENSIVE METABOLIC PANEL
ALT: 68 U/L — ABNORMAL HIGH (ref 0–44)
AST: 41 U/L (ref 15–41)
Albumin: 3.5 g/dL (ref 3.5–5.0)
Alkaline Phosphatase: 53 U/L (ref 38–126)
Anion gap: 12 (ref 5–15)
BUN: 16 mg/dL (ref 6–20)
CO2: 25 mmol/L (ref 22–32)
Calcium: 9.1 mg/dL (ref 8.9–10.3)
Chloride: 100 mmol/L (ref 98–111)
Creatinine, Ser: 1.88 mg/dL — ABNORMAL HIGH (ref 0.61–1.24)
GFR calc Af Amer: 45 mL/min — ABNORMAL LOW (ref >60)
GFR calc non Af Amer: 39 mL/min — ABNORMAL LOW (ref >60)
Glucose, Bld: 125 mg/dL — ABNORMAL HIGH (ref 70–99)
Potassium: 4.5 mmol/L (ref 3.5–5.1)
Sodium: 137 mmol/L (ref 135–145)
Total Bilirubin: 1.7 mg/dL — ABNORMAL HIGH (ref 0.3–1.2)
Total Protein: 7.1 g/dL (ref 6.5–8.1)

## 2019-08-18 LAB — HEMOGLOBIN A1C
Hgb A1c MFr Bld: 5.5 % (ref 4.8–5.6)
Mean Plasma Glucose: 111.15 mg/dL

## 2019-08-18 LAB — ETHANOL: Alcohol, Ethyl (B): <10 mg/dL (ref <10)

## 2019-08-18 LAB — POCT I-STAT 7, (LYTES, BLD GAS, ICA,H+H)
Acid-Base Excess: 1 mmol/L (ref 0.0–2.0)
Bicarbonate: 26.3 mmol/L (ref 20.0–28.0)
Calcium, Ion: 1.23 mmol/L (ref 1.15–1.40)
HCT: 47 % (ref 39.0–52.0)
Hemoglobin: 16 g/dL (ref 13.0–17.0)
O2 Saturation: 93 %
Patient temperature: 98.3
Potassium: 4.6 mmol/L (ref 3.5–5.1)
Sodium: 133 mmol/L — ABNORMAL LOW (ref 135–145)
TCO2: 28 mmol/L (ref 22–32)
pCO2 arterial: 42.5 mmHg (ref 32.0–48.0)
pH, Arterial: 7.399 (ref 7.350–7.450)
pO2, Arterial: 66 mmHg — ABNORMAL LOW (ref 83.0–108.0)

## 2019-08-18 LAB — URINALYSIS, ROUTINE W REFLEX MICROSCOPIC
Bilirubin Urine: NEGATIVE
Glucose, UA: NEGATIVE mg/dL
Ketones, ur: NEGATIVE mg/dL
Leukocytes,Ua: NEGATIVE
Nitrite: NEGATIVE
Protein, ur: 100 mg/dL — AB
Specific Gravity, Urine: 1.017 (ref 1.005–1.030)
pH: 5 (ref 5.0–8.0)

## 2019-08-18 LAB — RAPID URINE DRUG SCREEN, HOSP PERFORMED
Amphetamines: POSITIVE — AB
Barbiturates: NOT DETECTED
Benzodiazepines: NOT DETECTED
Cocaine: NOT DETECTED
Opiates: NOT DETECTED
Tetrahydrocannabinol: NOT DETECTED

## 2019-08-18 LAB — AMMONIA: Ammonia: 37 umol/L — ABNORMAL HIGH (ref 9–35)

## 2019-08-18 LAB — ECHOCARDIOGRAM COMPLETE
Height: 72 in
Weight: 2712.54 oz

## 2019-08-18 LAB — GLUCOSE, CAPILLARY: Glucose-Capillary: 125 mg/dL — ABNORMAL HIGH (ref 70–99)

## 2019-08-18 LAB — HIV ANTIBODY (ROUTINE TESTING W REFLEX): HIV Screen 4th Generation wRfx: NONREACTIVE

## 2019-08-18 LAB — MRSA PCR SCREENING: MRSA by PCR: POSITIVE — AB

## 2019-08-18 LAB — PHOSPHORUS: Phosphorus: 5 mg/dL — ABNORMAL HIGH (ref 2.5–4.6)

## 2019-08-18 LAB — CBG MONITORING, ED: Glucose-Capillary: 108 mg/dL — ABNORMAL HIGH (ref 70–99)

## 2019-08-18 LAB — MAGNESIUM: Magnesium: 2.3 mg/dL (ref 1.7–2.4)

## 2019-08-18 LAB — LACTIC ACID, PLASMA
Lactic Acid, Venous: 2 mmol/L (ref 0.5–1.9)
Lactic Acid, Venous: 1.3 mmol/L (ref 0.5–1.9)

## 2019-08-18 LAB — TROPONIN I (HIGH SENSITIVITY): Troponin I (High Sensitivity): 71 ng/L — ABNORMAL HIGH (ref <18)

## 2019-08-18 MED ORDER — WHITE PETROLATUM EX OINT
TOPICAL_OINTMENT | CUTANEOUS | Status: DC | PRN
  Filled 2019-08-18: qty 28.35

## 2019-08-18 MED ORDER — NICOTINE 7 MG/24HR TD PT24
7.0000 mg | MEDICATED_PATCH | Freq: Every day | TRANSDERMAL | Status: DC
  Administered 2019-08-18 – 2019-08-20 (×3): 7 mg via TRANSDERMAL
  Filled 2019-08-18 (×3): qty 1

## 2019-08-18 MED ORDER — DOCUSATE SODIUM 100 MG PO CAPS: 100.0000 mg | ORAL_CAPSULE | Freq: Two times a day (BID) | ORAL | Status: DC | PRN

## 2019-08-18 MED ORDER — GLUCAGON HCL RDNA (DIAGNOSTIC) 1 MG IJ SOLR
1.0000 mg/h | Freq: Once | INTRAVENOUS | Status: DC
  Filled 2019-08-18: qty 5

## 2019-08-18 MED ORDER — POLYETHYLENE GLYCOL 3350 17 G PO PACK: 17.0000 g | PACK | Freq: Every day | ORAL | Status: DC | PRN

## 2019-08-18 MED ORDER — ORAL CARE MOUTH RINSE
15.0000 mL | Freq: Two times a day (BID) | OROMUCOSAL | Status: DC
  Administered 2019-08-18 – 2019-08-19 (×2): 15 mL via OROMUCOSAL

## 2019-08-18 MED ORDER — ALBUTEROL SULFATE (2.5 MG/3ML) 0.083% IN NEBU
3.0000 mL | INHALATION_SOLUTION | RESPIRATORY_TRACT | Status: DC | PRN
  Administered 2019-08-19 – 2019-08-20 (×3): 3 mL via RESPIRATORY_TRACT
  Filled 2019-08-18 (×3): qty 3

## 2019-08-18 MED ORDER — GLUCAGON HCL RDNA (DIAGNOSTIC) 1 MG IJ SOLR
INTRAMUSCULAR | Status: AC
  Administered 2019-08-18: 1 mg
  Filled 2019-08-18: qty 2

## 2019-08-18 MED ORDER — CHLORHEXIDINE GLUCONATE CLOTH 2 % EX PADS: 6.0000 | MEDICATED_PAD | Freq: Every day | CUTANEOUS | Status: DC

## 2019-08-18 MED ORDER — NOREPINEPHRINE 4 MG/250ML-% IV SOLN
INTRAVENOUS | Status: AC
  Filled 2019-08-18: qty 250

## 2019-08-18 MED ORDER — GLUCAGON HCL RDNA (DIAGNOSTIC) 1 MG IJ SOLR
INTRAMUSCULAR | Status: AC
  Administered 2019-08-18: 1 mg
  Filled 2019-08-18: qty 1

## 2019-08-18 MED ORDER — MUPIROCIN 2 % EX OINT
1.0000 "application " | TOPICAL_OINTMENT | Freq: Two times a day (BID) | CUTANEOUS | Status: DC
  Administered 2019-08-18 – 2019-08-20 (×5): 1 via NASAL
  Filled 2019-08-18 (×2): qty 22

## 2019-08-18 MED ORDER — GLUCAGON HCL RDNA (DIAGNOSTIC) 1 MG IJ SOLR
INTRAMUSCULAR | Status: AC
  Administered 2019-08-18: 1 mg
  Filled 2019-08-18: qty 3

## 2019-08-18 MED ORDER — AZITHROMYCIN 500 MG PO TABS
500.0000 mg | ORAL_TABLET | Freq: Every day | ORAL | Status: AC
  Administered 2019-08-18: 500 mg via ORAL
  Filled 2019-08-18: qty 1

## 2019-08-18 MED ORDER — NOREPINEPHRINE 4 MG/250ML-% IV SOLN
2.0000 ug/min | INTRAVENOUS | Status: DC
  Administered 2019-08-18: 9 ug/min via INTRAVENOUS

## 2019-08-18 MED ORDER — LACTULOSE 10 GM/15ML PO SOLN
10.0000 g | Freq: Three times a day (TID) | ORAL | Status: AC
  Administered 2019-08-18 – 2019-08-19 (×3): 10 g via ORAL
  Filled 2019-08-18 (×3): qty 15

## 2019-08-18 MED ORDER — SODIUM CHLORIDE 0.9 % IV SOLN: 250.0000 mL | INTRAVENOUS | Status: DC

## 2019-08-18 MED ORDER — LACTATED RINGERS IV BOLUS
1000.0000 mL | Freq: Once | INTRAVENOUS | Status: AC
  Administered 2019-08-18: 1000 mL via INTRAVENOUS

## 2019-08-18 MED ORDER — AZITHROMYCIN 250 MG PO TABS
250.0000 mg | ORAL_TABLET | Freq: Every day | ORAL | Status: DC
  Administered 2019-08-19 – 2019-08-20 (×2): 250 mg via ORAL
  Filled 2019-08-18 (×2): qty 1

## 2019-08-18 MED ORDER — SODIUM CHLORIDE 0.9 % IV BOLUS: 500.0000 mL | Freq: Once | INTRAVENOUS | Status: DC

## 2019-08-18 MED ORDER — NALOXONE HCL 0.4 MG/ML IJ SOLN
INTRAMUSCULAR | Status: AC
  Administered 2019-08-18: 0.4 mg
  Filled 2019-08-18: qty 1

## 2019-08-18 NOTE — ED Notes (Addendum)
P:t was given a total of 7mg . Of glucagon IV  Per Dr.Garner

## 2019-08-18 NOTE — ED Notes (Signed)
Pt very diaphoretic and hard to wake up.  Pt will respond to painful stimuli then goes back to sleep.  Dr. Lanae Boast paged.

## 2019-08-18 NOTE — Progress Notes (Signed)
Pt got coreg around 2300.  HR now NSR down to 60s.  But BP also dropped all the way to 70 systolic.  NTG paste being removed.  Holding all home BP meds.

## 2019-08-18 NOTE — Progress Notes (Signed)
NAME:  Francisco [name], MRN:  [id number], DOB:  [birth year], LOS: 0 ADMISSION DATE:  08/17/2019, CONSULTATION DATE:  08/18/19 REFERRING MD:  EDP, CHIEF COMPLAINT:  Chest pain, bradyacardia   Brief History   59 y.o. F with PMH of HFrEF 25%, CAD s/p stent, COPD, HTN and polysubstance abuse who presented with chest pain for one week.  Admitted to internal medicine and home coreg resumed, shortly after patient became altered, bradycardic and hypotensive so PCCM consulted.   History of present illness   Francisco [name] is a 59 y.o. M with PMH of HFrEF 25%, CAD s/p stent, COPD, HTN and polysubstance abuse who admits to stopping allo home medications several months ago for financial reasons.  Yesterday, pt presented to the ED with approximately one week of chest pain, this became worse after smoking amphetamine.  His EKG did not show signs of STEMI or ischemia.  CXR without obvious pulmonary edema.      Pt was admitted and started on nitro paste, given Lasix 40mg , aspirin and home medicines resumed.  Echo ordered.   Pt was initially alert, oriented, tachycardic and hypertensive, his home Coreg 37.5mg  was resumed and shortly after, pt became hypotensive and bradycardic with worsening mental status. Nitro, coreg and lasix were discontinued and he was given 500cc IVF bolus along with Levophed.    Pt was also given Narcan 0.4mg  without improvement.   Past Medical History   has a past medical history of COPD (chronic obstructive pulmonary disease) (HCC), Coronary artery disease, Hepatitis-C, Hypertension, IV drug abuse (HCC), Medically noncompliant, MI (myocardial infarction) (HCC), and Tobacco abuse.   Significant Hospital Events   08/18/19 Admit from the ED waiting area  Consults:  PCCM  Procedures:    Significant Diagnostic Tests:  08/18/19  CT head>> no acute findings 08/18/19 CXR>>no acute findings; hyperinflated   Micro Data:  08/18/19 RVP>>negative   Antimicrobials:    Interim  history/subjective:  Remains confused. Reports that he is not taking any of his home meds.10/18/19 he had his stent placed in [place] within the last year, but is unsure. Smoking half pack per day.  Denies use of opiates, benzos, recent cocaine use. Denies alcohol use. His main complaint is being thirsty.  He denies chest pain currently.   Objective   Blood pressure 132/83, pulse (!) 58, temperature 97.8 F (36.6 C), temperature source Axillary, resp. rate 16, height 6' (1.829 m), weight 76.9 kg, SpO2 100 %.    FiO2 (%):  [45 %-100 %] 45 %   Intake/Output Summary (Last 24 hours) at 08/18/2019 0848 Last data filed at 08/18/2019 0800 Gross per 24 hour  Intake 588.91 ml  Output 1000 ml  Net -411.09 ml   Filed Weights   08/17/19 2007 08/18/19 0645  Weight: 90 kg 76.9 kg    General: Middle-age man lying in bed in no acute distress HEENT: [place]/AT, eyes anicteric, pupils small but reactive.  Oral mucosa moist, good detention. Neuro: Falling asleep quickly when talking, requires frequent stimulation to stay awake.  Sluggishly moving.  Able to move all extremities. CV: Regular rate and rhythm, no murmurs PULM: Breathing comfortably on 3 L nasal cannula saturating 100%.  Clear to auscultation bilaterally. GI: Soft, nontender, nondistended Extremities: Warm and dry Skin: No rashes or obvious wounds.  CXR personally reviewed 4/30-hyperinflated, increased retrosternal airspace with flattened hemidiaphragms, no significant opacities.   Resolved Hospital Problem list     Assessment & Plan:   Acute hypoxic respiratory failure -possibly secondary to  worsening pulmonary edema vs neurologic compromise, still arousable and protecting his airway -Decreased on 3 L nasal cannula, still saturating 100%.  Titrate down as able.  Hypotension/Bradycardia, likely distributive shock from medications -bradycardia improving after glucagon, remains on peripheral Levophed  -consider cardiogenic vs  septic shock vs beta-blocker effect -Continue to trend lactic acid -Additional 1 L LR -Continue norepinephrine as required to maintain MAP greater than 65  Acute encephalopathy. No acute head CT changes. Possibly secondary to precipitous decline in BP. ABG without hypercapnia.  -UDS ordered -Check ammonia, ETOH  -Avoid potentially sedating medications  AKI, likely prerenal from volume contraction and shock -1 L LR -Strict I/O -Maintain renal perfusion -UA pending -Continue to monitor -Avoid NSAIDs and other nephrotoxic meds.  Renally dose medications.  Lactic acidosis, likely due to hypoperfusion -Trend  History HFrEF 2/2 NICM, HTN, CAD with stent to RCA with 50% in-stent restenosis.  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  Presentation with chest pain and hypertension, possibly acute diastolic dysfunction precipitated by methamphetamine use. -Not taking home medications.  Intolerant to Coreg when it was restarted with hypotension. -Restart statin and aspirin daily -Repeat echocardiogram pending -Restart aspirin; not prescribed Plavix from January 2021 discharge  Tobacco abuse -Counseled on the importance of cessation -Nicotine patch daily  Best practice:  Diet: NPO Pain/Anxiety/Delirium protocol (if indicated): n/a VAP protocol (if indicated): n/a DVT prophylaxis: lovenox GI prophylaxis: n/a Glucose control: SSI Mobility: bed rest Code Status: full code  Family Communication: updated patient's parents via phone overnight Disposition: ICU  Labs   CBC: Recent Labs  Lab 08/17/19 2027 08/18/19 0350 08/18/19 0537  WBC 9.2  --  11.6*  HGB 16.1 16.0 16.0  HCT 48.5 47.0 49.8  MCV 95.1  --  97.3  PLT 241  --  217    Basic Metabolic Panel: Recent Labs  Lab 08/17/19 2027 08/18/19 0350 08/18/19 0537  NA 133* 133* 137  K 4.0 4.6 4.5  CL 99  --  100  CO2 22  --  25  GLUCOSE 105*  --  125*  BUN 12  --  16  CREATININE 0.91  --  1.88*  CALCIUM  9.0  --  9.1  MG  --   --  2.3  PHOS  --   --  5.0*   GFR: Estimated Creatinine Clearance: 46.6 mL/min (A) (by C-G formula based on SCr of 1.88 mg/dL (H)). Recent Labs  Lab 08/17/19 2027 08/18/19 0537  WBC 9.2 11.6*  LATICACIDVEN  --  2.0*    Liver Function Tests: Recent Labs  Lab 08/18/19 0537  AST 41  ALT 68*  ALKPHOS 53  BILITOT 1.7*  PROT 7.1  ALBUMIN 3.5   No results for input(s): LIPASE, AMYLASE in the last 168 hours. No results for input(s): AMMONIA in the last 168 hours.  ABG    Component Value Date/Time   PHART 7.399 08/18/2019 0350   PCO2ART 42.5 08/18/2019 0350   PO2ART 66 (L) 08/18/2019 0350   HCO3 26.3 08/18/2019 0350   TCO2 28 08/18/2019 0350   O2SAT 93.0 08/18/2019 0350     Coagulation Profile: No results for input(s): INR, PROTIME in the last 168 hours.  Cardiac Enzymes: No results for input(s): CKTOTAL, CKMB, CKMBINDEX, TROPONINI in the last 168 hours.  HbA1C: Hgb A1c MFr Bld  Date/Time Value Ref Range Status  04/29/2019 03:03 AM 5.5 4.8 - 5.6 % Final    Comment:    (NOTE) Pre diabetes:  5.7%-6.4% Diabetes:              >6.4% Glycemic control for   <7.0% adults with diabetes     CBG: Recent Labs  Lab 08/18/19 0126 08/18/19 0822  GLUCAP 108* 125*      Julian Hy, DO 08/18/19 9:09 AM [facility]

## 2019-08-18 NOTE — Progress Notes (Addendum)
Called by RN at ~145 am: Pt had become altered and BP had dropped to 50s-70s systolic (had been 148 pre coreg which he got at 11pm).  HR had come down from 127 to 67 (NSR).  NGT paste removed first thing.  BGL was 108 (before glucagon).  Given first 3mg  then 4mg  of glucagon (all the glucagon they had in ED).  Slight HR response up to 70s temporarily, but no BP response from glucagon.  Repeat EKG shows no STEMI.  Pt put on NRB, satting 100% on that.  RR 25, tried 0.4 of narcan anyhow at rec of EDP and given h/o heroin abuse in past but this had no effect.  BP continued to remain low so levophed gtt was started.  BP now 105/91 on / min levophed, but pt remains altered.  I called and spoke with PCCM Dr. , he recd: 1) give 500cc NS bolus now (I ordered that).  He is suspicious that patient may have actually been dry before being given lasix. 2) he is sending PCCM team to evaluate and/or manage.  I have also DC'd all orders for BP meds and diuretics, though all other BP meds and diuretics other than the 40mg  Lasix and NTG paste ordered by EDP and the coreg ordered by myself hadnt yet been given as they wernt due until 10am today.  CRITICAL CARE Performed by: .   Total critical care time: 90 minutes  Critical care time was exclusive of separately billable procedures and treating other patients.  Critical care was necessary to treat or prevent imminent or life-threatening deterioration.  Critical care was time spent personally by me on the following activities: development of treatment plan with patient and/or surrogate as well as nursing, discussions with consultants, evaluation of patient's response to treatment, examination of patient, obtaining history from patient or surrogate, ordering and performing treatments and interventions, ordering and review of laboratory studies, ordering and review of radiographic studies, pulse oximetry and re-evaluation of  patient's condition.

## 2019-08-18 NOTE — Plan of Care (Signed)

## 2019-08-18 NOTE — Progress Notes (Signed)
  Echocardiogram 2D Echocardiogram has been performed.  Ansleigh Safer A Jazelle Achey 08/18/2019, 10:17 AM

## 2019-08-18 NOTE — Consult Note (Addendum)
NAME:  Francisco [name], MRN:  [id number], DOB:  07/30/1960, LOS: 0 ADMISSION DATE:  08/17/2019, CONSULTATION DATE:  08/18/19 REFERRING MD:  EDP, CHIEF COMPLAINT:  Chest pain, bradyacardia   Brief History   59 y.o. F with PMH of HFrEF 25%, CAD s/p stent, COPD, HTN and polysubstance abuse who presented with chest pain for one week.  Admitted to internal medicine and home coreg resumed, shortly after patient became altered, bradycardic and hypotensive so PCCM consulted.   History of present illness   Mr. Galindo is a 59 y.o. M with PMH of HFrEF 25%, CAD s/p stent, COPD, HTN and polysubstance abuse who admits to stopping allo home medications several months ago for financial reasons.  Yesterday, pt presented to the ED with approximately one week of chest pain, this became worse after smoking amphetamine.  His EKG did not show signs of STEMI or ischemia.  CXR without obvious pulmonary edema.      Pt was admitted and started on nitro paste, given Lasix 40mg , aspirin and home medicines resumed.  Echo ordered.   Pt was initially alert, oriented, tachycardic and hypertensive, his home Coreg 37.5mg  was resumed and shortly after, pt became hypotensive and bradycardic with worsening mental status. Nitro, coreg and lasix were discontinued and he was given 500cc IVF bolus along with Levophed.    Pt was also given Narcan 0.4mg  without improvement.   Past Medical History   has a past medical history of COPD (chronic obstructive pulmonary disease) ([place]), Coronary artery disease, Hepatitis-C, Hypertension, IV drug abuse ([place]), Medically noncompliant, MI (myocardial infarction) ([place]), and Tobacco abuse.   Significant Hospital Events   08/18/19 Admit from the ED waiting area  Consults:    .Procedures:    Significant Diagnostic Tests:  08/18/19  CT head>>pending 08/18/19 CXR>>no acute findings   Micro Data:  08/18/19 RVP>>negative   Antimicrobials:    Interim history/subjective:  Pt protecting his airway,  but remains somnolent and altered   Objective   Blood pressure 104/64, pulse (!) 57, temperature 98.3 F (36.8 C), temperature source Oral, resp. rate (!) 23, height 6' (1.829 m), weight 90 kg, SpO2 100 %.        Intake/Output Summary (Last 24 hours) at 08/18/2019 0539 Last data filed at 08/17/2019 2328 Gross per 24 hour  Intake --  Output 1000 ml  Net -1000 ml   Filed Weights   08/17/19 2007  Weight: 90 kg    General:  Diaphoretic M, sleeping without obvious distress HEENT: MM pink/moist Neuro: Pupils 15mm and minimally responsive to light CV: s1s2  crackles bilateral bases, no m/r/g PULM:   GI: soft, bsx4 active  Extremities: cool, diaphoretic  Skin: no rashes or lesions   Resolved Hospital Problem list     Assessment & Plan:   Acute hypoxic respiratory failure -possibly secondary to worsening pulmonary edema vs neurologic compromise, still arousable and protecting his airway P: -on 15L [place], pO2 66, if requirement increasing may  -additional Lasix 60mg  x1 and monitor UOP -at risk for intubation if mental status is worsening    Hypotension/Bradycardia, possible shock -bradycardia improving after glucagon, remains on peripheral Levophed  -consider cardiogenic vs septic shock vs beta-blocker effect P: -check lactic acid, trend procalcitonin, echo shows EF 25-30% with grade III diastolic dysfunction  -titrate levophed to maintain Map >65, may need central line if requirement in increasing    Altered mental status, encephalopathy -possibly secondary to precipitous decline in BP, ABG without hypercapnia  P: -stat CT head -  check urine drug screen   History HFpEF, HTN  -continue Asa, hold home carvedilol, lisinopril, imdur, and spironolactone  Best practice:  Diet: NPO Pain/Anxiety/Delirium protocol (if indicated): n/a VAP protocol (if indicated): n/a DVT prophylaxis: lovenox GI prophylaxis: n/a Glucose control: SSI Mobility: bed rest Code Status: full code   Family Communication: updated patient's parents via phone Disposition: ICU  Labs   CBC: Recent Labs  Lab 08/17/19 2027 08/18/19 0350  WBC 9.2  --   HGB 16.1 16.0  HCT 48.5 47.0  MCV 95.1  --   PLT 241  --     Basic Metabolic Panel: Recent Labs  Lab 08/17/19 2027 08/18/19 0350  NA 133* 133*  K 4.0 4.6  CL 99  --   CO2 22  --   GLUCOSE 105*  --   BUN 12  --   CREATININE 0.91  --   CALCIUM 9.0  --    GFR: Estimated Creatinine Clearance: 97.1 mL/min (by C-G formula based on SCr of 0.91 mg/dL). Recent Labs  Lab 08/17/19 2027  WBC 9.2    Liver Function Tests: No results for input(s): AST, ALT, ALKPHOS, BILITOT, PROT, ALBUMIN in the last 168 hours. No results for input(s): LIPASE, AMYLASE in the last 168 hours. No results for input(s): AMMONIA in the last 168 hours.  ABG    Component Value Date/Time   PHART 7.399 08/18/2019 0350   PCO2ART 42.5 08/18/2019 0350   PO2ART 66 (L) 08/18/2019 0350   HCO3 26.3 08/18/2019 0350   TCO2 28 08/18/2019 0350   O2SAT 93.0 08/18/2019 0350     Coagulation Profile: No results for input(s): INR, PROTIME in the last 168 hours.  Cardiac Enzymes: No results for input(s): CKTOTAL, CKMB, CKMBINDEX, TROPONINI in the last 168 hours.  HbA1C: Hgb A1c MFr Bld  Date/Time Value Ref Range Status  04/29/2019 03:03 AM 5.5 4.8 - 5.6 % Final    Comment:    (NOTE) Pre diabetes:          5.7%-6.4% Diabetes:              >6.4% Glycemic control for   <7.0% adults with diabetes     CBG: Recent Labs  Lab 08/18/19 0126  GLUCAP 108*    Review of Systems:     Past Medical History  He,  has a past medical history of COPD (chronic obstructive pulmonary disease) (HCC), Coronary artery disease, Hepatitis-C, Hypertension, IV drug abuse (HCC), Medically noncompliant, MI (myocardial infarction) (HCC), and Tobacco abuse.   Surgical History    Past Surgical History:  Procedure Laterality Date  . BACK SURGERY    . NECK SURGERY    .  RIGHT/LEFT HEART CATH AND CORONARY ANGIOGRAPHY N/A 04/30/2019   Procedure: RIGHT/LEFT HEART CATH AND CORONARY ANGIOGRAPHY;  Surgeon: Marykay Lex, MD;  Location: [facility] INVASIVE CV LAB;  Service: Cardiovascular;  Laterality: N/A;  . stints       Social History   reports that he has been smoking cigarettes. He has been smoking about 0.75 packs per day. He has never used smokeless tobacco. He reports current drug use. Drug: Methamphetamines. He reports that he does not drink alcohol.   Family History   His family history includes Rheum arthritis in his mother.   Allergies Allergies  Allergen Reactions  . Ivp Dye [Iodinated Diagnostic Agents] Other (See Comments)    seizures  . Tramadol Other (See Comments)    seizures     Home Medications  Prior to  Admission medications   Medication Sig Start Date End Date Taking? Authorizing Provider  albuterol (VENTOLIN HFA) 108 (90 Base) MCG/ACT inhaler Inhale 1-2 puffs into the lungs every 6 (six) hours as needed for wheezing or shortness of breath. 04/16/19  Yes Terrilee Files, MD  aspirin EC 81 MG tablet Take 81 mg by mouth daily.   Yes [provider]  nitroGLYCERIN (NITROSTAT) 0.4 MG SL tablet Place 1 tablet (0.4 mg total) under the tongue every 5 (five) minutes x 3 doses as needed for chest pain. 05/02/19  Yes Barrett, Joline Salt, PA-C  atorvastatin (LIPITOR) 80 MG tablet Take 1 tablet (80 mg total) by mouth daily at 6 PM. Patient not taking: Reported on 08/17/2019 05/02/19   Barrett, Joline Salt, PA-C  carvedilol (COREG) 25 MG tablet Take 1.5 tablets (37.5 mg total) by mouth 2 (two) times daily with a meal. Patient not taking: Reported on 08/17/2019 05/05/19   Charlestine Night, PA-C  celecoxib (CELEBREX) 200 MG capsule Take 1 capsule (200 mg total) by mouth 2 (two) times daily. Patient not taking: Reported on 08/17/2019 05/02/19   Barrett, Joline Salt, PA-C  furosemide (LASIX) 40 MG tablet Take 1 tablet (40 mg total) by mouth daily. Patient  not taking: Reported on 08/17/2019 05/02/19   Barrett, Joline Salt, PA-C  ipratropium-albuterol (DUONEB) 0.5-2.5 (3) MG/3ML SOLN Take 3 mLs by nebulization 3 (three) times daily. Patient not taking: Reported on 08/17/2019 05/08/19   Marcelino Duster, PA  isosorbide mononitrate (IMDUR) 30 MG 24 hr tablet Take 1 tablet (30 mg total) by mouth daily. 1/2 tab (15mg ) for one week the whole tab(30mg ) therafter Patient not taking: Reported on 08/17/2019 05/08/19 08/06/19  08/08/19, PA  lisinopril (ZESTRIL) 20 MG tablet Take 1 tablet (20 mg total) by mouth daily. Patient not taking: Reported on 08/17/2019 05/03/19   Barrett, 05/05/19, PA-C  mometasone-formoterol (DULERA) 200-5 MCG/ACT AERO Inhale 2 puffs into the lungs 2 (two) times daily. Patient not taking: Reported on 08/17/2019 06/19/18   08/19/18, MD  nicotine (NICODERM CQ - DOSED IN MG/24 HOURS) 14 mg/24hr patch Place 1 patch (14 mg total) onto the skin daily. Patient not taking: Reported on 05/18/2019 06/20/18   08/20/18, MD  spironolactone (ALDACTONE) 25 MG tablet Take 0.5 tablets (12.5 mg total) by mouth daily. Patient not taking: Reported on 05/18/2019 05/03/19   Barrett, 05/05/19, PA-C     Critical care time: 50 minutes    CRITICAL CARE Performed by: Joline Salt Sharlotte Baka   Total critical care time: 50 minutes  Critical care time was exclusive of separately billable procedures and treating other patients.  Critical care was necessary to treat or prevent imminent or life-threatening deterioration.  Critical care was time spent personally by me on the following activities: development of treatment plan with patient and/or surrogate as well as nursing, discussions with consultants, evaluation of patient's response to treatment, examination of patient, obtaining history from patient or surrogate, ordering and performing treatments and interventions, ordering and review of laboratory studies, ordering and review of radiographic studies, pulse  oximetry and re-evaluation of patient's condition.   Darcella Gasman Monchel Pollitt, PA-C

## 2019-08-18 NOTE — Plan of Care (Signed)
Reassessed the patient. Off levophed since he received a fluid bolus this morning. Was able to walk to the bathroom earlier with assistance. Remains on minimal supplemental O2. He is now able to stay awake for a full conversation. He has had increased sputum production that is discolored for the past week, different than his baseline clear sputum.  Plan: Will collect sputum culture Transfer to tele floor.  Continue to wean O2. Will add lactulose for mildly elevated ammonia. Discussed with Dr. Natale Milch from [facility], who will assume his care tomorrow.  Steffanie Dunn, DO 08/18/19 1:58 PM [facility]

## 2019-08-19 LAB — CBC
HCT: 46.4 % (ref 39.0–52.0)
Hemoglobin: 15.3 g/dL (ref 13.0–17.0)
MCH: 31.6 pg (ref 26.0–34.0)
MCHC: 33 g/dL (ref 30.0–36.0)
MCV: 95.9 fL (ref 80.0–100.0)
Platelets: 212 10*3/uL (ref 150–400)
RBC: 4.84 MIL/uL (ref 4.22–5.81)
RDW: 12.3 % (ref 11.5–15.5)
WBC: 10.9 10*3/uL — ABNORMAL HIGH (ref 4.0–10.5)
nRBC: 0 % (ref 0.0–0.2)

## 2019-08-19 LAB — EXPECTORATED SPUTUM ASSESSMENT W GRAM STAIN, RFLX TO RESP C: Special Requests: NORMAL

## 2019-08-19 LAB — BASIC METABOLIC PANEL
Anion gap: 7 (ref 5–15)
BUN: 22 mg/dL — ABNORMAL HIGH (ref 6–20)
CO2: 27 mmol/L (ref 22–32)
Calcium: 8.8 mg/dL — ABNORMAL LOW (ref 8.9–10.3)
Chloride: 102 mmol/L (ref 98–111)
Creatinine, Ser: 1.21 mg/dL (ref 0.61–1.24)
GFR calc Af Amer: >60 mL/min (ref >60)
GFR calc non Af Amer: >60 mL/min (ref >60)
Glucose, Bld: 125 mg/dL — ABNORMAL HIGH (ref 70–99)
Potassium: 4 mmol/L (ref 3.5–5.1)
Sodium: 136 mmol/L (ref 135–145)

## 2019-08-19 LAB — AMMONIA: Ammonia: 20 umol/L (ref 9–35)

## 2019-08-19 MED ORDER — BUDESONIDE 0.25 MG/2ML IN SUSP
0.2500 mg | Freq: Two times a day (BID) | RESPIRATORY_TRACT | Status: DC
  Administered 2019-08-19 – 2019-08-20 (×2): 0.25 mg via RESPIRATORY_TRACT
  Filled 2019-08-19 (×2): qty 2

## 2019-08-19 MED ORDER — IPRATROPIUM-ALBUTEROL 0.5-2.5 (3) MG/3ML IN SOLN
3.0000 mL | Freq: Four times a day (QID) | RESPIRATORY_TRACT | Status: DC
  Administered 2019-08-19 – 2019-08-20 (×4): 3 mL via RESPIRATORY_TRACT
  Filled 2019-08-19 (×4): qty 3

## 2019-08-19 MED ORDER — ALPRAZOLAM 0.25 MG PO TABS
0.2500 mg | ORAL_TABLET | Freq: Three times a day (TID) | ORAL | Status: DC | PRN
  Administered 2019-08-19 – 2019-08-20 (×3): 0.25 mg via ORAL
  Filled 2019-08-19 (×3): qty 1

## 2019-08-19 MED ORDER — LIDOCAINE 5 % EX PTCH
1.0000 | MEDICATED_PATCH | CUTANEOUS | Status: DC
  Administered 2019-08-19: 1 via TRANSDERMAL
  Filled 2019-08-19: qty 1

## 2019-08-19 MED ORDER — SODIUM CHLORIDE 0.9 % IV SOLN: INTRAVENOUS | Status: AC

## 2019-08-19 MED ORDER — PREDNISONE 20 MG PO TABS
40.0000 mg | ORAL_TABLET | Freq: Every day | ORAL | Status: DC
  Administered 2019-08-19 – 2019-08-20 (×2): 40 mg via ORAL
  Filled 2019-08-19 (×2): qty 2

## 2019-08-19 MED ORDER — LABETALOL HCL 5 MG/ML IV SOLN: 10.0000 mg | INTRAVENOUS | Status: DC | PRN

## 2019-08-19 MED ORDER — GUAIFENESIN ER 600 MG PO TB12
600.0000 mg | ORAL_TABLET | Freq: Two times a day (BID) | ORAL | Status: DC
  Administered 2019-08-19 – 2019-08-20 (×3): 600 mg via ORAL
  Filled 2019-08-19 (×3): qty 1

## 2019-08-19 MED ORDER — CARVEDILOL 25 MG PO TABS
37.5000 mg | ORAL_TABLET | Freq: Two times a day (BID) | ORAL | Status: DC
  Administered 2019-08-19 – 2019-08-20 (×2): 37.5 mg via ORAL
  Filled 2019-08-19 (×3): qty 1

## 2019-08-19 MED ORDER — RIVAROXABAN 10 MG PO TABS
10.0000 mg | ORAL_TABLET | Freq: Every day | ORAL | Status: DC
  Administered 2019-08-19: 10 mg via ORAL
  Filled 2019-08-19 (×2): qty 1

## 2019-08-19 MED ORDER — ISOSORBIDE MONONITRATE ER 30 MG PO TB24
30.0000 mg | ORAL_TABLET | Freq: Every day | ORAL | Status: DC
  Administered 2019-08-19 – 2019-08-20 (×2): 30 mg via ORAL
  Filled 2019-08-19 (×2): qty 1

## 2019-08-19 MED ORDER — OXYCODONE HCL 5 MG PO TABS
5.0000 mg | ORAL_TABLET | ORAL | Status: DC | PRN
  Administered 2019-08-19 – 2019-08-20 (×2): 5 mg via ORAL
  Filled 2019-08-19 (×2): qty 1

## 2019-08-19 NOTE — Progress Notes (Addendum)
PROGRESS NOTE    Francisco [name]  GEX:[id number] DOB: 1960/08/28 DOA: 08/17/2019 PCP: Patient, No Pcp Per   Brief Narrative:  Per PCCM note: 59 y.o. F with PMH of HFrEF 25%, CAD s/p stent, COPD, HTN and polysubstance abuse who presented with chest pain for one week.  Admitted to internal medicine and home coreg resumed, shortly after patient became altered, bradycardic and hypotensive so PCCM consulted.   5/2: Patient was noted to have hypotension and bradycardia likely secondary to distributive shock related to his home medications as well as methamphetamine use.  He was also noted to have associated acute hypoxemic respiratory failure as well as encephalopathy.  This is again likely secondary to methamphetamine use, however he did have some elevated ammonia levels yesterday which have now improved after dose of lactulose.  Prerenal AKI is also improving this morning with creatinine 1.2.  He is noted to have persistent wheezing as well as chest tightness and cough with high amounts of sputum production this morning.  Assessment & Plan:   Principal Problem:   Acute on chronic combined systolic (congestive) and diastolic (congestive) heart failure (HCC) Active Problems:   Elevated troponin   Dilated cardiomyopathy (HCC)   CAD S/P percutaneous coronary angioplasty   Methamphetamine abuse (HCC)   Hypotension   Acute hypoxemic respiratory failure secondary to COPD exacerbation -Start DuoNebs every 6 hours with every 4 hours breathing treatments as needed -Pulmicort twice daily -Prednisone 40 mg daily to initiate today -Wean oxygen as tolerated -Flutter valve/I-S and up in chair  Distributive shock secondary to polypharmacy -This has currently resolved and patient has been transferred from critical care unit with elevated blood pressures this morning -Labetalol as needed -Resume home carvedilol and Imdur -Hold nephrotoxic agents -Patient has been weaned off norepinephrine  Acute  encephalopathy-resolved -Likely secondary to shock from above -He is back to baseline with improvement in ammonia levels noted as well - recheck ammonia levels in a.m. and continue to monitor for now  Prerenal AKI-nonoliguric, improving -Currently resolving with creatinine 1.2 and baseline creatinine noted to be 0.9 -IV fluids reordered, time-limited for 12 hours with a.m. recheck of labs -Avoid nephrotoxic agents -Urine output of 1425 mL documented over last 24 hours  History of heart failure with reduced EF secondary to nonischemic cardiomyopathy/CAD -Blood pressure has improved and Coreg will be resumed -Statin and aspirin daily -2D echocardiogram with LVEF 50-55% and grade 2 diastolic dysfunction -Continue to monitor carefully  Tobacco abuse -Counseled on cessation with nicotine patch daily  DVT prophylaxis: Xarelto (patient refusing sticks for Lovenox/heparin) Code Status: Full code Family Communication: None at bedside Disposition Plan: Monitor blood pressures as well as mentation.  Treatment of COPD exacerbation ongoing with breathing treatments as well as initiation of steroids.  Anticipate discharge in the next 24-48 hours once his COPD has stabilized.  CSW involvement for assistance with home medications.   Consultants:   PCCM  Procedures:   2D echocardiogram 5/1  Antimicrobials:  Anti-infectives (From admission, onward)   Start     Dose/Rate Route Frequency Ordered Stop   08/19/19 1000  azithromycin (ZITHROMAX) tablet 250 mg     250 mg Oral Daily 08/18/19 1423 08/23/19 0959   08/18/19 1430  azithromycin (ZITHROMAX) tablet 500 mg     500 mg Oral Daily 08/18/19 1423 08/18/19 1621       Subjective: Patient seen and evaluated today with ongoing wheezing as well as chest tightness and productive cough.  He is getting some improvement with breathing  treatments.  Blood pressures have elevated this morning.  He appears to be at his baseline level of  mentation.  Objective: Vitals:   08/19/19 0140 08/19/19 0634 08/19/19 0727 08/19/19 0809  BP:  (!) 172/101 (!) 152/91   Pulse:  93 100 97  Resp:  18  (!) 24  Temp:  98.2 F (36.8 C)    TempSrc:  Oral    SpO2: 93% 98% 99% 99%  Weight:      Height:        Intake/Output Summary (Last 24 hours) at 08/19/2019 0904 Last data filed at 08/19/2019 0636 Gross per 24 hour  Intake 412.87 ml  Output 1425 ml  Net -1012.13 ml   Filed Weights   08/17/19 2007 08/18/19 0645 08/18/19 1650  Weight: 90 kg 76.9 kg 78.8 kg    Examination:  General exam: Appears calm and comfortable  Respiratory system: Profound wheezing noted bilaterally. Respiratory effort normal.  Currently on 3 L nasal cannula oxygen. Cardiovascular system: S1 & S2 heard, RRR. No JVD, murmurs, rubs, gallops or clicks. No pedal edema. Gastrointestinal system: Abdomen is nondistended, soft and nontender. No organomegaly or masses felt. Normal bowel sounds heard. Central nervous system: Alert and oriented. No focal neurological deficits. Extremities: Symmetric 5 x 5 power. Skin: No rashes, lesions or ulcers Psychiatry: Judgement and insight appear normal. Mood & affect appropriate.     Data Reviewed: I have personally reviewed following labs and imaging studies  CBC: Recent Labs  Lab 08/17/19 2027 08/18/19 0350 08/18/19 0537 08/19/19 0515  WBC 9.2  --  11.6* 10.9*  HGB 16.1 16.0 16.0 15.3  HCT 48.5 47.0 49.8 46.4  MCV 95.1  --  97.3 95.9  PLT 241  --  217 212   Basic Metabolic Panel: Recent Labs  Lab 08/17/19 2027 08/18/19 0350 08/18/19 0537 08/19/19 0515  NA 133* 133* 137 136  K 4.0 4.6 4.5 4.0  CL 99  --  100 102  CO2 22  --  25 27  GLUCOSE 105*  --  125* 125*  BUN 12  --  16 22*  CREATININE 0.91  --  1.88* 1.21  CALCIUM 9.0  --  9.1 8.8*  MG  --   --  2.3  --   PHOS  --   --  5.0*  --    GFR: Estimated Creatinine Clearance: 73 mL/min (by C-G formula based on SCr of 1.21 mg/dL). Liver Function  Tests: Recent Labs  Lab 08/18/19 0537  AST 41  ALT 68*  ALKPHOS 53  BILITOT 1.7*  PROT 7.1  ALBUMIN 3.5   No results for input(s): LIPASE, AMYLASE in the last 168 hours. Recent Labs  Lab 08/18/19 1127 08/19/19 0713  AMMONIA 37* 20   Coagulation Profile: No results for input(s): INR, PROTIME in the last 168 hours. Cardiac Enzymes: No results for input(s): CKTOTAL, CKMB, CKMBINDEX, TROPONINI in the last 168 hours. BNP (last 3 results) No results for input(s): PROBNP in the last 8760 hours. HbA1C: Recent Labs    08/18/19 0838  HGBA1C 5.5   CBG: Recent Labs  Lab 08/18/19 0126 08/18/19 0822  GLUCAP 108* 125*   Lipid Profile: No results for input(s): CHOL, HDL, LDLCALC, TRIG, CHOLHDL, LDLDIRECT in the last 72 hours. Thyroid Function Tests: No results for input(s): TSH, T4TOTAL, FREET4, T3FREE, THYROIDAB in the last 72 hours. Anemia Panel: No results for input(s): VITAMINB12, FOLATE, FERRITIN, TIBC, IRON, RETICCTPCT in the last 72 hours. Sepsis Labs: Recent Labs  Lab  08/18/19 0537 08/18/19 1610  LATICACIDVEN 2.0* 1.3    Recent Results (from the past 240 hour(s))  Respiratory Panel by RT PCR (Flu A&B, Covid) - Nasopharyngeal Swab     Status: None   Collection Time: 08/17/19 10:30 PM   Specimen: Nasopharyngeal Swab  Result Value Ref Range Status   SARS Coronavirus 2 by RT PCR NEGATIVE NEGATIVE Final    Comment: (NOTE) SARS-CoV-2 target nucleic acids are NOT DETECTED. The SARS-CoV-2 RNA is generally detectable in upper respiratoy specimens during the acute phase of infection. The lowest concentration of SARS-CoV-2 viral copies this assay can detect is 131 copies/mL. A negative result does not preclude SARS-Cov-2 infection and should not be used as the sole basis for treatment or other patient management decisions. A negative result may occur with  improper specimen collection/handling, submission of specimen other than nasopharyngeal swab, presence of viral  mutation(s) within the areas targeted by this assay, and inadequate number of viral copies (<131 copies/mL). A negative result must be combined with clinical observations, patient history, and epidemiological information. The expected result is Negative. Fact Sheet for Patients:  https://www.moore.com/ Fact Sheet for Healthcare Providers:  https://www.young.biz/ This test is not yet ap proved or cleared by the [place] FDA and  has been authorized for detection and/or diagnosis of SARS-CoV-2 by FDA under an Emergency Use Authorization (EUA). This EUA will remain  in effect (meaning this test can be used) for the duration of the COVID-19 declaration under Section 564(b)(1) of the Act, 21 U.S.C. section 360bbb-3(b)(1), unless the authorization is terminated or revoked sooner.    Influenza A by PCR NEGATIVE NEGATIVE Final   Influenza B by PCR NEGATIVE NEGATIVE Final    Comment: (NOTE) The Xpert Xpress SARS-CoV-2/FLU/RSV assay is intended as an aid in  the diagnosis of influenza from Nasopharyngeal swab specimens and  should not be used as a sole basis for treatment. Nasal washings and  aspirates are unacceptable for Xpert Xpress SARS-CoV-2/FLU/RSV  testing. Fact Sheet for Patients: https://www.moore.com/ Fact Sheet for Healthcare Providers: https://www.young.biz/ This test is not yet approved or cleared by the [place] FDA and  has been authorized for detection and/or diagnosis of SARS-CoV-2 by  FDA under an Emergency Use Authorization (EUA). This EUA will remain  in effect (meaning this test can be used) for the duration of the  Covid-19 declaration under Section 564(b)(1) of the Act, 21  U.S.C. section 360bbb-3(b)(1), unless the authorization is  terminated or revoked. Performed at [facility], [address] [place], [place] [address]   MRSA PCR Screening     Status: Abnormal    Collection Time: 08/18/19  5:58 AM   Specimen: Nasal Mucosa; Nasopharyngeal  Result Value Ref Range Status   MRSA by PCR POSITIVE (A) NEGATIVE Final    Comment:        The GeneXpert MRSA Assay (FDA approved for NASAL specimens only), is one component of a comprehensive MRSA colonization surveillance program. It is not intended to diagnose MRSA infection nor to guide or monitor treatment for MRSA infections. RESULT CALLED TO, READ BACK BY AND VERIFIED WITH: RN  Boris Lown [phone] FCP Performed at [facility], [address] [place], [place] [address]   Expectorated sputum assessment w rflx to resp cult     Status: None   Collection Time: 08/18/19  2:59 PM   Specimen: Expectorated Sputum  Result Value Ref Range Status   Specimen Description EXPECTORATED SPUTUM  Final  Special Requests   Final    Normal Performed at [facility], [address], [place], [place] [address]    Sputum evaluation THIS SPECIMEN IS ACCEPTABLE FOR SPUTUM CULTURE  Final   Report Status 08/19/2019 FINAL  Final  Culture, respiratory     Status: None (Preliminary result)   Collection Time: 08/18/19  2:59 PM  Result Value Ref Range Status   Specimen Description EXPECTORATED SPUTUM  Final   Special Requests Normal Reflexed from [id number]  Final   Gram Stain   Final    ABUNDANT WBC PRESENT, PREDOMINANTLY PMN RARE GRAM POSITIVE COCCI RARE GRAM POSITIVE RODS    Culture PENDING  Incomplete   Report Status PENDING  Incomplete         Radiology Studies: DG Chest 2 View  Result Date: 08/17/2019 CLINICAL DATA:  Shortness of breath and chest tightness beginning today. EXAM: CHEST - 2 VIEW COMPARISON:  07/23/2019 FINDINGS: Lungs are adequately inflated and otherwise clear. Flattening of the hemidiaphragms on the lateral film. Cardiomediastinal silhouette and remainder the exam is unchanged. IMPRESSION: No active cardiopulmonary disease. Electronically Signed   By: Elberta Fortis M.D.   On:  08/17/2019 20:48   CT HEAD WO CONTRAST  Result Date: 08/18/2019 CLINICAL DATA:  Altered mental status. Smoked methamphetamine today. Diaphoresis. No reported injury. EXAM: CT HEAD WITHOUT CONTRAST TECHNIQUE: Contiguous axial images were obtained from the base of the skull through the vertex without intravenous contrast. COMPARISON:  None. FINDINGS: Brain: Left basal ganglia lacunar infarct, chronic appearing. No evidence of parenchymal hemorrhage or extra-axial fluid collection. No mass lesion, mass effect, or midline shift. No CT evidence of acute infarction. Cerebral volume is age appropriate. No ventriculomegaly. Vascular: No acute abnormality. Skull: No evidence of calvarial fracture. Sinuses/Orbits: The visualized paranasal sinuses are essentially clear. Other:  The mastoid air cells are unopacified. IMPRESSION: 1. No evidence of acute intracranial abnormality. 2. Left basal ganglia lacunar infarct, chronic appearing. Electronically Signed   By: Delbert Phenix M.D.   On: 08/18/2019 05:15   ECHOCARDIOGRAM COMPLETE  Result Date: 08/18/2019    ECHOCARDIOGRAM REPORT   Patient Name:   Francisco [name] Date of Exam: 08/18/2019 Medical Rec #:  [id number]   Height:       72.0 in Accession #:    [id number]  Weight:       169.5 lb Date of Birth:  12-29-1960   BSA:          1.986 m Patient Age:    58 years    BP:           127/79 mmHg Patient Gender: M           HR:           58 bpm. Exam Location:  Inpatient Procedure: 2D Echo Indications:    CHF-Acute Diastolic 428.31 / I50.31  History:        Patient has prior history of Echocardiogram examinations, most                 recent 04/29/2019. Dilated cardiomyopathy, CAD,                 Signs/Symptoms:Hypotension; Risk Factors:Methamphetamine abuse.  Sonographer:    Leeroy Bock Turrentine Referring Phys: [id number] LAURA R GLEASON IMPRESSIONS  1. Low normal LV systolic function; mild LVH; grade 2 diastolic dysfunction.  2. Left ventricular ejection fraction, by estimation, is 50 to  55%. The left ventricle has low normal function. The left ventricle has no  regional wall motion abnormalities. There is mild left ventricular hypertrophy. Left ventricular diastolic parameters are consistent with Grade II diastolic dysfunction (pseudonormalization).  3. Right ventricular systolic function is normal. The right ventricular size is normal.  4. The mitral valve is normal in structure. Trivial mitral valve regurgitation. No evidence of mitral stenosis.  5. The aortic valve is tricuspid. Aortic valve regurgitation is not visualized. Mild aortic valve sclerosis is present, with no evidence of aortic valve stenosis.  6. The inferior vena cava is normal in size with greater than 50% respiratory variability, suggesting right atrial pressure of 3 mmHg. FINDINGS  Left Ventricle: Left ventricular ejection fraction, by estimation, is 50 to 55%. The left ventricle has low normal function. The left ventricle has no regional wall motion abnormalities. The left ventricular internal cavity size was normal in size. There is mild left ventricular hypertrophy. Left ventricular diastolic parameters are consistent with Grade II diastolic dysfunction (pseudonormalization). Right Ventricle: The right ventricular size is normal.Right ventricular systolic function is normal. Left Atrium: Left atrial size was normal in size. Right Atrium: Right atrial size was normal in size. Pericardium: There is no evidence of pericardial effusion. Mitral Valve: The mitral valve is normal in structure. Normal mobility of the mitral valve leaflets. Trivial mitral valve regurgitation. No evidence of mitral valve stenosis. Tricuspid Valve: The tricuspid valve is normal in structure. Tricuspid valve regurgitation is trivial. No evidence of tricuspid stenosis. Aortic Valve: The aortic valve is tricuspid. Aortic valve regurgitation is not visualized. Mild aortic valve sclerosis is present, with no evidence of aortic valve stenosis. Pulmonic Valve:  The pulmonic valve was not well visualized. Pulmonic valve regurgitation is not visualized. No evidence of pulmonic stenosis. Aorta: The aortic root is normal in size and structure. Venous: The inferior vena cava is normal in size with greater than 50% respiratory variability, suggesting right atrial pressure of 3 mmHg. IAS/Shunts: No atrial level shunt detected by color flow Doppler. Additional Comments: Low normal LV systolic function; mild LVH; grade 2 diastolic dysfunction.  LEFT VENTRICLE PLAX 2D LVIDd:         4.30 cm  Diastology LVIDs:         3.50 cm  LV e' lateral:   7.40 cm/s LV PW:         1.20 cm  LV E/e' lateral: 9.9 LV IVS:        1.20 cm  LV e' medial:    6.74 cm/s LVOT diam:     1.90 cm  LV E/e' medial:  10.9 LV SV:         55 LV SV Index:   28 LVOT Area:     2.84 cm  RIGHT VENTRICLE RV S prime:     10.20 cm/s TAPSE (M-mode): 2.1 cm LEFT ATRIUM             Index       RIGHT ATRIUM           Index LA diam:        4.10 cm 2.06 cm/m  RA Area:     18.50 cm LA Vol (A2C):   42.6 ml 21.45 ml/m RA Volume:   52.30 ml  26.33 ml/m LA Vol (A4C):   31.7 ml 15.96 ml/m LA Biplane Vol: 37.2 ml 18.73 ml/m  AORTIC VALVE LVOT Vmax:   94.60 cm/s LVOT Vmean:  69.400 cm/s LVOT VTI:    0.195 m  AORTA Ao Root diam: 3.40 cm MITRAL VALVE MV Area (PHT): 2.54  cm    SHUNTS MV Decel Time: 299 msec    Systemic VTI:  0.20 m MV E velocity: 73.50 cm/s  Systemic Diam: 1.90 cm MV A velocity: 39.20 cm/s MV E/A ratio:  1.88 Kirk Ruths MD Electronically signed by Kirk Ruths MD Signature Date/Time: 08/18/2019/12:56:57 PM    Final         Scheduled Meds: . aspirin EC  81 mg Oral Daily  . atorvastatin  80 mg Oral q1800  . azithromycin  250 mg Oral Daily  . budesonide (PULMICORT) nebulizer solution  0.25 mg Nebulization BID  . carvedilol  37.5 mg Oral BID WC  . Chlorhexidine Gluconate Cloth  6 each Topical Q0600  . enoxaparin (LOVENOX) injection  40 mg Subcutaneous Q24H  . guaiFENesin  600 mg Oral BID  .  ipratropium-albuterol  3 mL Nebulization Q6H  . isosorbide mononitrate  30 mg Oral Daily  . lactulose  10 g Oral TID  . mouth rinse  15 mL Mouth Rinse BID  . mupirocin ointment  1 application Nasal BID  . nicotine  7 mg Transdermal Daily  . predniSONE  40 mg Oral Q breakfast  . sodium chloride flush  3 mL Intravenous Q12H   Continuous Infusions: . sodium chloride    . glucagon (GLUCAGEN) infusion (for beta blocker/calcium channel blocker overdose)    . sodium chloride       LOS: 1 day    Time spent: 35 minutes    Lamira Borin Darleen Crocker, DO [facility]  If 7PM-7AM, please contact night-coverage [email or web address] 08/19/2019, 9:04 AM

## 2019-08-19 NOTE — Progress Notes (Signed)
Patient is requesting for prednisone.

## 2019-08-20 LAB — BASIC METABOLIC PANEL
Anion gap: 7 (ref 5–15)
BUN: 16 mg/dL (ref 6–20)
CO2: 33 mmol/L — ABNORMAL HIGH (ref 22–32)
Calcium: 9.3 mg/dL (ref 8.9–10.3)
Chloride: 101 mmol/L (ref 98–111)
Creatinine, Ser: 1.1 mg/dL (ref 0.61–1.24)
GFR calc Af Amer: >60 mL/min (ref >60)
GFR calc non Af Amer: >60 mL/min (ref >60)
Glucose, Bld: 131 mg/dL — ABNORMAL HIGH (ref 70–99)
Potassium: 4.3 mmol/L (ref 3.5–5.1)
Sodium: 141 mmol/L (ref 135–145)

## 2019-08-20 LAB — CBC
HCT: 42.4 % (ref 39.0–52.0)
Hemoglobin: 13.7 g/dL (ref 13.0–17.0)
MCH: 32.1 pg (ref 26.0–34.0)
MCHC: 32.3 g/dL (ref 30.0–36.0)
MCV: 99.3 fL (ref 80.0–100.0)
Platelets: 241 10*3/uL (ref 150–400)
RBC: 4.27 MIL/uL (ref 4.22–5.81)
RDW: 12.5 % (ref 11.5–15.5)
WBC: 9.8 10*3/uL (ref 4.0–10.5)
nRBC: 0 % (ref 0.0–0.2)

## 2019-08-20 LAB — AMMONIA: Ammonia: 50 umol/L — ABNORMAL HIGH (ref 9–35)

## 2019-08-20 MED ORDER — GUAIFENESIN 100 MG/5ML PO SOLN
5.0000 mL | ORAL | Status: DC | PRN
  Filled 2019-08-20: qty 5

## 2019-08-20 MED ORDER — FUROSEMIDE 40 MG PO TABS
40.0000 mg | ORAL_TABLET | Freq: Every day | ORAL | 1 refills | Status: DC
Start: 2019-08-20 — End: 2019-08-20

## 2019-08-20 MED ORDER — METHYLPREDNISOLONE 4 MG PO TABS
4.0000 mg | ORAL_TABLET | Freq: Every day | ORAL | 1 refills | Status: DC
Start: 2019-08-20 — End: 2020-01-17

## 2019-08-20 MED ORDER — GUAIFENESIN 100 MG/5ML PO SOLN: 5.0000 mL | ORAL | 0 refills | Status: DC | PRN

## 2019-08-20 MED ORDER — LISINOPRIL 20 MG PO TABS: 20.0000 mg | ORAL_TABLET | Freq: Every day | ORAL | 1 refills | Status: DC

## 2019-08-20 MED ORDER — MOMETASONE FURO-FORMOTEROL FUM 200-5 MCG/ACT IN AERO: 2.0000 | INHALATION_SPRAY | Freq: Two times a day (BID) | RESPIRATORY_TRACT | Status: DC

## 2019-08-20 MED ORDER — ALBUTEROL SULFATE HFA 108 (90 BASE) MCG/ACT IN AERS: 1.0000 | INHALATION_SPRAY | Freq: Four times a day (QID) | RESPIRATORY_TRACT | 1 refills | Status: DC | PRN

## 2019-08-20 MED ORDER — ALBUTEROL SULFATE (2.5 MG/3ML) 0.083% IN NEBU: 2.5000 mg | INHALATION_SOLUTION | RESPIRATORY_TRACT | Status: DC

## 2019-08-20 MED ORDER — CARVEDILOL 25 MG PO TABS: 37.5000 mg | ORAL_TABLET | Freq: Two times a day (BID) | ORAL | 2 refills | Status: DC

## 2019-08-20 MED ORDER — GUAIFENESIN 100 MG/5ML PO SOLN: 5.0000 mL | ORAL | 0 refills | Status: AC | PRN

## 2019-08-20 MED ORDER — NICOTINE 14 MG/24HR TD PT24: 14.0000 mg | MEDICATED_PATCH | Freq: Every day | TRANSDERMAL | 3 refills | Status: DC

## 2019-08-20 MED ORDER — CARVEDILOL 25 MG PO TABS: 37.5000 mg | ORAL_TABLET | Freq: Two times a day (BID) | ORAL | 1 refills | Status: DC

## 2019-08-20 MED ORDER — AZITHROMYCIN 500 MG PO TABS
500.0000 mg | ORAL_TABLET | Freq: Every day | ORAL | 0 refills | Status: DC
Start: 2019-08-20 — End: 2019-08-20

## 2019-08-20 MED ORDER — FUROSEMIDE 40 MG PO TABS
40.0000 mg | ORAL_TABLET | Freq: Every day | ORAL | 2 refills | Status: DC
Start: 2019-08-20 — End: 2020-01-04

## 2019-08-20 MED ORDER — ALBUTEROL SULFATE HFA 108 (90 BASE) MCG/ACT IN AERS: 1.0000 | INHALATION_SPRAY | Freq: Four times a day (QID) | RESPIRATORY_TRACT | 3 refills | Status: DC | PRN

## 2019-08-20 MED ORDER — LISINOPRIL 20 MG PO TABS: 20.0000 mg | ORAL_TABLET | Freq: Every day | ORAL | 2 refills | Status: DC

## 2019-08-20 MED ORDER — AZITHROMYCIN 500 MG PO TABS
500.0000 mg | ORAL_TABLET | Freq: Every day | ORAL | 0 refills | Status: AC
Start: 2019-08-20 — End: 2019-08-24

## 2019-08-20 MED ORDER — ISOSORBIDE MONONITRATE ER 30 MG PO TB24: 30.0000 mg | ORAL_TABLET | Freq: Every day | ORAL | 3 refills | Status: DC

## 2019-08-20 MED ORDER — IPRATROPIUM-ALBUTEROL 0.5-2.5 (3) MG/3ML IN SOLN: 3.0000 mL | Freq: Three times a day (TID) | RESPIRATORY_TRACT | 3 refills | Status: DC

## 2019-08-20 MED ORDER — ISOSORBIDE MONONITRATE ER 30 MG PO TB24: 30.0000 mg | ORAL_TABLET | Freq: Every day | ORAL | 1 refills | Status: DC

## 2019-08-20 MED ORDER — NICOTINE 14 MG/24HR TD PT24: 14.0000 mg | MEDICATED_PATCH | Freq: Every day | TRANSDERMAL | 2 refills | Status: AC

## 2019-08-20 MED ORDER — NITROGLYCERIN 0.4 MG SL SUBL: 0.4000 mg | SUBLINGUAL_TABLET | SUBLINGUAL | 3 refills | Status: DC | PRN

## 2019-08-20 MED ORDER — ATORVASTATIN CALCIUM 80 MG PO TABS: 80.0000 mg | ORAL_TABLET | Freq: Every day | ORAL | 3 refills | Status: DC

## 2019-08-20 MED ORDER — METHYLPREDNISOLONE 4 MG PO TABS
4.0000 mg | ORAL_TABLET | Freq: Every day | ORAL | 0 refills | Status: DC
Start: 2019-08-20 — End: 2019-08-20

## 2019-08-20 MED ORDER — ALBUTEROL SULFATE HFA 108 (90 BASE) MCG/ACT IN AERS
1.0000 | INHALATION_SPRAY | RESPIRATORY_TRACT | Status: DC | PRN
  Filled 2019-08-20: qty 6.7

## 2019-08-20 MED FILL — LISINOPRIL 20 MG TABLET: 20 | 30 days supply | Qty: 30 | Fill #0

## 2019-08-20 MED FILL — ISOSORBIDE MN ER 30 MG TAB: 30 | 30 days supply | Qty: 30 | Fill #0

## 2019-08-20 MED FILL — METHYLPREDNISOLONE 4 MG DOS: 4 | 6 days supply | Qty: 21 | Fill #0

## 2019-08-20 MED FILL — AZITHROMYCIN 500 MG TABLET: 500 | 4 days supply | Qty: 4 | Fill #0

## 2019-08-20 MED FILL — ALBUTEROL SULFATE HFA 108 (: 108 (90 BAS | 25 days supply | Qty: 18 | Fill #0

## 2019-08-20 MED FILL — CARVEDILOL 25 MG TABLET: 25 | 30 days supply | Qty: 90 | Fill #0

## 2019-08-20 MED FILL — ATORVASTATIN CALCIUM 80 MG: 80 | 30 days supply | Qty: 30 | Fill #0

## 2019-08-20 MED FILL — FUROSEMIDE 40 MG TABLET: 40 | 30 days supply | Qty: 30 | Fill #0

## 2019-08-20 NOTE — Discharge Summary (Signed)
Physician Discharge Summary [facility]    Patient: Francisco [name]                   Admit date: 08/17/2019   DOB: August 15, 1960             Discharge date:08/20/2019/1:51 PM JME:[id number]                          PCP: Patient, No Pcp Per  Disposition: HOME  Recommendations for Outpatient Follow-up:   . Follow up: in 1 week  Discharge Condition: Stable   Code Status:   Code Status: Full Code  Diet recommendation: Cardiac diet   Discharge Diagnoses:    Principal Problem:   Acute on chronic combined systolic (congestive) and diastolic (congestive) heart failure (HCC) Active Problems:   Elevated troponin   Dilated cardiomyopathy (HCC)   CAD S/P percutaneous coronary angioplasty   Methamphetamine abuse (HCC)   Hypotension   History of Present Illness/ Hospital Course [name] Summary:    Brief Narrative:  Per PCCM note: 59 y.o.F with PMH of HFrEF 25%, CAD s/p stent, COPD, HTN and polysubstance abuse who presented with chest pain for one week. Admitted to internal medicine and home coreg resumed, shortly after patient became altered, bradycardic and hypotensive so PCCM consulted.   59: Patient was noted to have hypotension and bradycardia likely secondary to distributive shock related to his home medications as well as methamphetamine use.  He was also noted to have associated acute hypoxemic respiratory failure as well as encephalopathy.  This is again likely secondary to methamphetamine use, however he did have some elevated ammonia levels yesterday which have now improved after dose of lactulose.  Prerenal AKI is also improving this morning with creatinine 1.2.  He is noted to have persistent wheezing as well as chest tightness and cough with high amounts of sputum production this morning.  08/20/2019: Patient was seen and examined, restless like to be discharged stating lost a friend to drug overdose would like to go attend to his matters. He was manually taken off his  O2 in the room he consistently satting about 91% on room air, mildly desatted to 88-89% while sleeping per nursing staff We finally have cleared the patient to be discharged.  Prescription for antibiotics, inhalers, steroids was given.Marland Kitchen He is instructed to follow-up with PCP as soon as possible.  For better blood pressure control, along with adjustment of his inhalers consultation to pulmonologist. He is instructed to quit smoking immediately, avoid any drugs including benefiting.  Detailed discharge summary:    Acute hypoxemic respiratory failure secondary to COPD exacerbation -During her hospital course treated with scheduled DuoNeb, home inhalers including albuterol, Pulmicort reinitiated prescription was given  -Prednisone 40 mg daily to initiate today... Switch to Medrol Dosepak taper -Wean oxygen as tolerated --sats about 91% on room air, mildly desats to 88-89 at rest, sleeping -Flutter valve/I-S and up in chair  Distributive shock secondary to polypharmacy -This has currently resolved and patient has been transferred from critical care unit with elevated blood pressures this morning -Labetalol was utilized as needed -Resume home carvedilol and Imdur -Patient has been weaned off norepinephrine  Acute encephalopathy-resolved -Likely secondary to shock from above -He is back to baseline with improvement in ammonia levels noted as well   Prerenal AKI-nonoliguric, improving -Resolved -  creatinine 1.2 and baseline creatinine noted to be 0.9 -IV fluids reordered, time-limited for 12 hours with a.m. recheck of labs -  Avoid nephrotoxic agents -Good urine output noted  History of heart failure with reduced EF secondary to nonischemic cardiomyopathy/CAD -Blood pressure has improved and Coreg will be resumed -Statin and aspirin daily -2D echocardiogram with LVEF 50-55% and grade 2 diastolic dysfunction -Patient was monitored restarted on Lasix p.o. at home  Tobacco abuse/drug  abuse amphetamine -Counseled on cessation with nicotine patch daily NicoDerm patch has been prescribed -Patient was instructed to abstain from any drug use including amphetamine    Code Status: Full code Family Communication: None at bedside Disposition Plan:  The patient is insisting to be discharged as he lost a friend over the weekend and drug overdose he would like to attend his funeral.    Consultants:   PCCM  Procedures:   2D echocardiogram 5/1  Nutritional status:          Discharge Instructions:   Discharge Instructions    Activity as tolerated - No restrictions   Complete by: As directed    Diet - low sodium heart healthy   Complete by: As directed    Discharge instructions   Complete by: As directed    Abstain from alcohol, drugs, tobacco. Continue current medications, including NicoDerm patch, prednisone taper, continue taking inhalers as instructed Need to see a pulmonologist as soon as possible, along with PCP   Increase activity slowly   Complete by: As directed        Medication List    STOP taking these medications   celecoxib 200 MG capsule Commonly known as: CELEBREX   spironolactone 25 MG tablet Commonly known as: ALDACTONE     TAKE these medications   albuterol 108 (90 Base) MCG/ACT inhaler Commonly known as: VENTOLIN HFA Inhale 1-2 puffs into the lungs every 6 (six) hours as needed for wheezing or shortness of breath.   aspirin EC 81 MG tablet Take 81 mg by mouth daily.   atorvastatin 80 MG tablet Commonly known as: LIPITOR Take 1 tablet (80 mg total) by mouth daily at 6 PM.   azithromycin 500 MG tablet Commonly known as: Zithromax Take 1 tablet (500 mg total) by mouth daily for 5 days.   carvedilol 25 MG tablet Commonly known as: COREG Take 1.5 tablets (37.5 mg total) by mouth 2 (two) times daily with a meal.   furosemide 40 MG tablet Commonly known as: Lasix Take 1 tablet (40 mg total) by mouth daily.     guaiFENesin 100 MG/5ML Soln Commonly known as: ROBITUSSIN Take 5 mLs (100 mg total) by mouth every 4 (four) hours as needed for cough or to loosen phlegm.   ipratropium-albuterol 0.5-2.5 (3) MG/3ML Soln Commonly known as: DUONEB Take 3 mLs by nebulization 3 (three) times daily.   isosorbide mononitrate 30 MG 24 hr tablet Commonly known as: IMDUR Take 1 tablet (30 mg total) by mouth daily. Start taking on: Aug 21, 2019 What changed: additional instructions   lisinopril 20 MG tablet Commonly known as: ZESTRIL Take 1 tablet (20 mg total) by mouth daily.   methylPREDNISolone 4 MG tablet Commonly known as: Medrol Take 1 tablet (4 mg total) by mouth daily. Please follow instruction for Dosepak   mometasone-formoterol 200-5 MCG/ACT Aero Commonly known as: DULERA Inhale 2 puffs into the lungs 2 (two) times daily.   nicotine 14 mg/24hr patch Commonly known as: NICODERM CQ - dosed in mg/24 hours Place 1 patch (14 mg total) onto the skin daily for 28 days.   nitroGLYCERIN 0.4 MG SL tablet Commonly known as: NITROSTAT Place  1 tablet (0.4 mg total) under the tongue every 5 (five) minutes x 3 doses as needed for chest pain.       Allergies  Allergen Reactions  . Ivp Dye [Iodinated Diagnostic Agents] Other (See Comments)    seizures  . Tramadol Other (See Comments)    seizures     Procedures /Studies:   DG Chest 2 View  Result Date: 08/17/2019 CLINICAL DATA:  Shortness of breath and chest tightness beginning today. EXAM: CHEST - 2 VIEW COMPARISON:  07/23/2019 FINDINGS: Lungs are adequately inflated and otherwise clear. Flattening of the hemidiaphragms on the lateral film. Cardiomediastinal silhouette and remainder the exam is unchanged. IMPRESSION: No active cardiopulmonary disease. Electronically Signed   By: Elberta Fortis M.D.   On: 08/17/2019 20:48   DG Chest 2 View  Result Date: 07/23/2019 CLINICAL DATA:  Chest pain and shortness of breath. EXAM: CHEST - 2 VIEW COMPARISON:   05/18/2019 FINDINGS: Heart size is normal. Mediastinal shadows are normal except for the presence of coronary artery stents. The lungs are clear. No bronchial thickening. No infiltrate, mass, effusion or collapse. Pulmonary vascularity is normal. No bony abnormality. IMPRESSION: No active disease.  Coronary artery stents. Electronically Signed   By: Paulina Fusi M.D.   On: 07/23/2019 13:38   CT HEAD WO CONTRAST  Result Date: 08/18/2019 CLINICAL DATA:  Altered mental status. Smoked methamphetamine today. Diaphoresis. No reported injury. EXAM: CT HEAD WITHOUT CONTRAST TECHNIQUE: Contiguous axial images were obtained from the base of the skull through the vertex without intravenous contrast. COMPARISON:  None. FINDINGS: Brain: Left basal ganglia lacunar infarct, chronic appearing. No evidence of parenchymal hemorrhage or extra-axial fluid collection. No mass lesion, mass effect, or midline shift. No CT evidence of acute infarction. Cerebral volume is age appropriate. No ventriculomegaly. Vascular: No acute abnormality. Skull: No evidence of calvarial fracture. Sinuses/Orbits: The visualized paranasal sinuses are essentially clear. Other:  The mastoid air cells are unopacified. IMPRESSION: 1. No evidence of acute intracranial abnormality. 2. Left basal ganglia lacunar infarct, chronic appearing. Electronically Signed   By: Delbert Phenix M.D.   On: 08/18/2019 05:15   ECHOCARDIOGRAM COMPLETE  Result Date: 08/18/2019    ECHOCARDIOGRAM REPORT   Patient Name:   ALFORD GAMERO Date of Exam: 08/18/2019 Medical Rec #:  [id number]   Height:       72.0 in Accession #:    [id number]  Weight:       169.5 lb Date of Birth:  02-10-1961   BSA:          1.986 m Patient Age:    58 years    BP:           127/79 mmHg Patient Gender: M           HR:           58 bpm. Exam Location:  Inpatient Procedure: 2D Echo Indications:    CHF-Acute Diastolic 428.31 / I50.31  History:        Patient has prior history of Echocardiogram examinations, most                  recent 04/29/2019. Dilated cardiomyopathy, CAD,                 Signs/Symptoms:Hypotension; Risk Factors:Methamphetamine abuse.  Sonographer:    Leeroy Bock Turrentine Referring Phys: [id number] LAURA R GLEASON IMPRESSIONS  1. Low normal LV systolic function; mild LVH; grade 2 diastolic dysfunction.  2. Left ventricular ejection fraction, by estimation, is 50  to 55%. The left ventricle has low normal function. The left ventricle has no regional wall motion abnormalities. There is mild left ventricular hypertrophy. Left ventricular diastolic parameters are consistent with Grade II diastolic dysfunction (pseudonormalization).  3. Right ventricular systolic function is normal. The right ventricular size is normal.  4. The mitral valve is normal in structure. Trivial mitral valve regurgitation. No evidence of mitral stenosis.  5. The aortic valve is tricuspid. Aortic valve regurgitation is not visualized. Mild aortic valve sclerosis is present, with no evidence of aortic valve stenosis.  6. The inferior vena cava is normal in size with greater than 50% respiratory variability, suggesting right atrial pressure of 3 mmHg. FINDINGS  Left Ventricle: Left ventricular ejection fraction, by estimation, is 50 to 55%. The left ventricle has low normal function. The left ventricle has no regional wall motion abnormalities. The left ventricular internal cavity size was normal in size. There is mild left ventricular hypertrophy. Left ventricular diastolic parameters are consistent with Grade II diastolic dysfunction (pseudonormalization). Right Ventricle: The right ventricular size is normal.Right ventricular systolic function is normal. Left Atrium: Left atrial size was normal in size. Right Atrium: Right atrial size was normal in size. Pericardium: There is no evidence of pericardial effusion. Mitral Valve: The mitral valve is normal in structure. Normal mobility of the mitral valve leaflets. Trivial mitral valve  regurgitation. No evidence of mitral valve stenosis. Tricuspid Valve: The tricuspid valve is normal in structure. Tricuspid valve regurgitation is trivial. No evidence of tricuspid stenosis. Aortic Valve: The aortic valve is tricuspid. Aortic valve regurgitation is not visualized. Mild aortic valve sclerosis is present, with no evidence of aortic valve stenosis. Pulmonic Valve: The pulmonic valve was not well visualized. Pulmonic valve regurgitation is not visualized. No evidence of pulmonic stenosis. Aorta: The aortic root is normal in size and structure. Venous: The inferior vena cava is normal in size with greater than 50% respiratory variability, suggesting right atrial pressure of 3 mmHg. IAS/Shunts: No atrial level shunt detected by color flow Doppler. Additional Comments: Low normal LV systolic function; mild LVH; grade 2 diastolic dysfunction.  LEFT VENTRICLE PLAX 2D LVIDd:         4.30 cm  Diastology LVIDs:         3.50 cm  LV e' lateral:   7.40 cm/s LV PW:         1.20 cm  LV E/e' lateral: 9.9 LV IVS:        1.20 cm  LV e' medial:    6.74 cm/s LVOT diam:     1.90 cm  LV E/e' medial:  10.9 LV SV:         55 LV SV Index:   28 LVOT Area:     2.84 cm  RIGHT VENTRICLE RV S prime:     10.20 cm/s TAPSE (M-mode): 2.1 cm LEFT ATRIUM             Index       RIGHT ATRIUM           Index LA diam:        4.10 cm 2.06 cm/m  RA Area:     18.50 cm LA Vol (A2C):   42.6 ml 21.45 ml/m RA Volume:   52.30 ml  26.33 ml/m LA Vol (A4C):   31.7 ml 15.96 ml/m LA Biplane Vol: 37.2 ml 18.73 ml/m  AORTIC VALVE LVOT Vmax:   94.60 cm/s LVOT Vmean:  69.400 cm/s LVOT VTI:    0.195  m  AORTA Ao Root diam: 3.40 cm MITRAL VALVE MV Area (PHT): 2.54 cm    SHUNTS MV Decel Time: 299 msec    Systemic VTI:  0.20 m MV E velocity: 73.50 cm/s  Systemic Diam: 1.90 cm MV A velocity: 39.20 cm/s MV E/A ratio:  1.88 Olga Millers MD Electronically signed by Olga Millers MD Signature Date/Time: 08/18/2019/12:56:57 PM    Final      Subjective:     Patient was seen and examined 08/20/2019, 1:51 PM Patient stable today. No acute distress.  No issues overnight Stable for discharge.  Discharge Exam:    Vitals:   08/20/19 0547 08/20/19 0733 08/20/19 0810 08/20/19 1309  BP: (!) 145/79     Pulse: 89     Resp: 20     Temp: 98.2 F (36.8 C)     TempSrc: Oral     SpO2: 97% 97% 91% 91%  Weight: 79.2 kg     Height:        General: Pt lying comfortably in bed & appears in no obvious distress. Cardiovascular: S1 & S2 heard, RRR, S1/S2 +. No murmurs, rubs, gallops or clicks. No JVD or pedal edema. Respiratory: Clear to auscultation without wheezing, rhonchi or crackles. No increased work of breathing. Abdominal:  Non-distended, non-tender & soft. No organomegaly or masses appreciated. Normal bowel sounds heard. CNS: Alert and oriented. No focal deficits. Extremities: no edema, no cyanosis    The results of significant diagnostics from this hospitalization (including imaging, microbiology, ancillary and laboratory) are listed below for reference.      Microbiology:   Recent Results (from the past 240 hour(s))  Respiratory Panel by RT PCR (Flu A&B, Covid) - Nasopharyngeal Swab     Status: None   Collection Time: 08/17/19 10:30 PM   Specimen: Nasopharyngeal Swab  Result Value Ref Range Status   SARS Coronavirus 2 by RT PCR NEGATIVE NEGATIVE Final    Comment: (NOTE) SARS-CoV-2 target nucleic acids are NOT DETECTED. The SARS-CoV-2 RNA is generally detectable in upper respiratoy specimens during the acute phase of infection. The lowest concentration of SARS-CoV-2 viral copies this assay can detect is 131 copies/mL. A negative result does not preclude SARS-Cov-2 infection and should not be used as the sole basis for treatment or other patient management decisions. A negative result may occur with  improper specimen collection/handling, submission of specimen other than nasopharyngeal swab, presence of viral mutation(s) within  the areas targeted by this assay, and inadequate number of viral copies (<131 copies/mL). A negative result must be combined with clinical observations, patient history, and epidemiological information. The expected result is Negative. Fact Sheet for Patients:  https://www.moore.com/ Fact Sheet for Healthcare Providers:  https://www.young.biz/ This test is not yet ap proved or cleared by the [place] FDA and  has been authorized for detection and/or diagnosis of SARS-CoV-2 by FDA under an Emergency Use Authorization (EUA). This EUA will remain  in effect (meaning this test can be used) for the duration of the COVID-19 declaration under Section 564(b)(1) of the Act, 21 U.S.C. section 360bbb-3(b)(1), unless the authorization is terminated or revoked sooner.    Influenza A by PCR NEGATIVE NEGATIVE Final   Influenza B by PCR NEGATIVE NEGATIVE Final    Comment: (NOTE) The Xpert Xpress SARS-CoV-2/FLU/RSV assay is intended as an aid in  the diagnosis of influenza from Nasopharyngeal swab specimens and  should not be used as a sole basis for treatment. Nasal washings and  aspirates are unacceptable for Xpert  Xpress SARS-CoV-2/FLU/RSV  testing. Fact Sheet for Patients: https://www.moore.com/ Fact Sheet for Healthcare Providers: https://www.young.biz/ This test is not yet approved or cleared by the [place] FDA and  has been authorized for detection and/or diagnosis of SARS-CoV-2 by  FDA under an Emergency Use Authorization (EUA). This EUA will remain  in effect (meaning this test can be used) for the duration of the  Covid-19 declaration under Section 564(b)(1) of the Act, 21  U.S.C. section 360bbb-3(b)(1), unless the authorization is  terminated or revoked. Performed at [facility], [address] [place], [place] [address]   MRSA PCR Screening     Status: Abnormal   Collection Time: 08/18/19   5:58 AM   Specimen: Nasal Mucosa; Nasopharyngeal  Result Value Ref Range Status   MRSA by PCR POSITIVE (A) NEGATIVE Final    Comment:        The GeneXpert MRSA Assay (FDA approved for NASAL specimens only), is one component of a comprehensive MRSA colonization surveillance program. It is not intended to diagnose MRSA infection nor to guide or monitor treatment for MRSA infections. RESULT CALLED TO, READ BACK BY AND VERIFIED WITH: RN  Boris Lown [phone] FCP Performed at [facility], [address] [place], [place] [address]   Expectorated sputum assessment w rflx to resp cult     Status: None   Collection Time: 08/18/19  2:59 PM   Specimen: Expectorated Sputum  Result Value Ref Range Status   Specimen Description EXPECTORATED SPUTUM  Final   Special Requests   Final    Normal Performed at [facility], [address], [place], [place] [address]    Sputum evaluation THIS SPECIMEN IS ACCEPTABLE FOR SPUTUM CULTURE  Final   Report Status 08/19/2019 FINAL  Final  Culture, respiratory     Status: None (Preliminary result)   Collection Time: 08/18/19  2:59 PM  Result Value Ref Range Status   Specimen Description EXPECTORATED SPUTUM  Final   Special Requests Normal Reflexed from [id number]  Final   Gram Stain   Final    ABUNDANT WBC PRESENT, PREDOMINANTLY PMN RARE GRAM POSITIVE COCCI RARE GRAM POSITIVE RODS    Culture   Final    ABUNDANT HAEMOPHILUS INFLUENZAE BETA LACTAMASE NEGATIVE Performed at [facility], [address], [place], [place] [address]    Report Status PENDING  Incomplete     Labs:   CBC: Recent Labs  Lab 08/17/19 2027 08/18/19 0350 08/18/19 0537 08/19/19 0515 08/20/19 0437  WBC 9.2  --  11.6* 10.9* 9.8  HGB 16.1 16.0 16.0 15.3 13.7  HCT 48.5 47.0 49.8 46.4 42.4  MCV 95.1  --  97.3 95.9 99.3  PLT 241  --  217 212 241   Basic Metabolic Panel: Recent Labs  Lab 08/17/19 2027 08/18/19 0350 08/18/19 0537 08/19/19 0515  08/20/19 0437  NA 133* 133* 137 136 141  K 4.0 4.6 4.5 4.0 4.3  CL 99  --  100 102 101  CO2 22  --  25 27 33*  GLUCOSE 105*  --  125* 125* 131*  BUN 12  --  16 22* 16  CREATININE 0.91  --  1.88* 1.21 1.10  CALCIUM 9.0  --  9.1 8.8* 9.3  MG  --   --  2.3  --   --   PHOS  --   --  5.0*  --   --    Liver Function Tests: Recent Labs  Lab 08/18/19 0537  AST 41  ALT 68*  ALKPHOS 53  BILITOT 1.7*  PROT 7.1  ALBUMIN 3.5   BNP (last 3 results) Recent Labs    05/05/19 1114 05/18/19 1349 08/17/19 2027  BNP 285.1* 212.1* 271.1*   Cardiac Enzymes: No results for input(s): CKTOTAL, CKMB, CKMBINDEX, TROPONINI in the last 168 hours. CBG: Recent Labs  Lab 08/18/19 0126 08/18/19 0822  GLUCAP 108* 125*   Hgb A1c Recent Labs    08/18/19 0838  HGBA1C 5.5   Lipid Profile No results for input(s): CHOL, HDL, LDLCALC, TRIG, CHOLHDL, LDLDIRECT in the last 72 hours. Thyroid function studies No results for input(s): TSH, T4TOTAL, T3FREE, THYROIDAB in the last 72 hours.  Invalid input(s): FREET3 Anemia work up No results for input(s): VITAMINB12, FOLATE, FERRITIN, TIBC, IRON, RETICCTPCT in the last 72 hours. Urinalysis    Component Value Date/Time   COLORURINE AMBER (A) 08/18/2019 1320   APPEARANCEUR CLOUDY (A) 08/18/2019 1320   LABSPEC 1.017 08/18/2019 1320   PHURINE 5.0 08/18/2019 1320   GLUCOSEU NEGATIVE 08/18/2019 1320   HGBUR SMALL (A) 08/18/2019 1320   BILIRUBINUR NEGATIVE 08/18/2019 1320   KETONESUR NEGATIVE 08/18/2019 1320   PROTEINUR 100 (A) 08/18/2019 1320   NITRITE NEGATIVE 08/18/2019 1320   LEUKOCYTESUR NEGATIVE 08/18/2019 1320         Time coordinating discharge: Over 45 minutes  SIGNED: Kendell Bane, MD, FACP, FHM. [facility],  Please use [email or web address] to Page If 7PM-7AM, please contact night-coverage [email or web address], Password [facility] 08/20/2019, 1:51 PM

## 2019-08-20 NOTE — Plan of Care (Signed)
  Problem: Education: Goal: Ability to demonstrate management of disease process will improve Outcome: Completed/Met Goal: Ability to verbalize understanding of medication therapies will improve Outcome: Completed/Met   Problem: Activity: Goal: Capacity to carry out activities will improve Outcome: Completed/Met   Problem: Cardiac: Goal: Ability to achieve and maintain adequate cardiopulmonary perfusion will improve Outcome: Completed/Met

## 2019-08-20 NOTE — Discharge Instructions (Signed)

## 2019-08-20 NOTE — TOC Initial Note (Signed)
Transition of Care [facility]) - Initial/Assessment Note    Patient Details  Name: Francisco [name] MRN: [id number] Date of Birth: 1960-06-06  Transition of Care [facility]) CM/SW Contact:    Gala Lewandowsky, RN Phone Number: 08/20/2019, 2:09 PM  Clinical Narrative:  Risk for readmission assessment completed. Patient presented for shortness of breath. Patient is without insurance and primary care provider. Case Manager called to the [facility] to establish an appointment-placed on AVS. MATCH will be utilized for medications and prescriptions sent to [facility] to be delivered via the room prior to discharge. Patient will be able to utilize the [facility] post hospitalization for future medications that will range in cost from $4.00-$10.00. Patient has transportation home. Patient is anxiously ready to leave- no further needs from Case Manager at this time.                 Expected Discharge Plan: Home/Self Care Barriers to Discharge: No Barriers Identified   Patient Goals and CMS Choice Patient states their goals for this hospitalization and ongoing recovery are:: to return home   Choice offered to / list presented to : NA  Expected Discharge Plan and Services Expected Discharge Plan: Home/Self Care In-house Referral: NA Discharge Planning Services: CM Consult, Follow-up appt scheduled, [facility], [facility] Program, Medication Assistance Post Acute Care Choice: NA Living arrangements for the past 2 months: Single Family Home Expected Discharge Date: 08/20/19               DME Arranged: N/A   HH Arranged: NA HH Agency: NA  Prior Living Arrangements/Services Living arrangements for the past 2 months: Single Family Home Lives with:: Self Patient language and need for interpreter reviewed:: Yes Do you feel safe going back to the place where you live?: Yes      Need for Family Participation in Patient Care: No (Comment) Care giver support system in  place?: No (comment)   Criminal Activity/Legal Involvement Pertinent to Current Situation/Hospitalization: No - Comment as needed  Activities of Daily Living      Permission Sought/Granted Permission sought to share information with : Family Supports, Photographer granted to share info w AGENCY: [facility]        Emotional Assessment Appearance:: Appears stated age Attitude/Demeanor/Rapport: Engaged   Orientation: : Oriented to Self, Oriented to Place, Oriented to  Time, Oriented to Situation Alcohol / Substance Use: (Provided substance abuse resources.) Psych Involvement: No (comment)  Admission diagnosis:  Acute on chronic combined systolic (congestive) and diastolic (congestive) heart failure (HCC) [I50.43] Hypotension [I95.9] Acute on chronic congestive heart failure, unspecified heart failure type (HCC) [I50.9] Patient Active Problem List   Diagnosis Date Noted  . Hypotension 08/18/2019  . Acute on chronic combined systolic (congestive) and diastolic (congestive) heart failure (HCC) 08/17/2019  . 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  . Chronic obstructive pulmonary disease (HCC)   . Elevated troponin   . Unstable angina (HCC)   . Dilated cardiomyopathy (HCC)   . CAD S/P percutaneous coronary angioplasty   . ACS (acute coronary syndrome) (HCC) 04/29/2019  . Acute respiratory failure with hypoxia (HCC) 06/17/2018   PCP:  Patient, No Pcp Per Pharmacy:   [id number] [place] Otto, [place] - [address] AT [facility] [address] [place] [address] Phone: [phone] Fax: [phone]  [facility] [place], [place] -  [address] [place] [address] Phone: [phone] Fax: [phone]   Readmission Risk Interventions Readmission Risk Prevention Plan 08/20/2019  Transportation Screening  Complete  PCP or Specialist Appt within 3-5 Days Complete  HRI or [place] Complete  Social Work Consult for [place] Planning/Counseling Complete  Palliative Care Screening Not Applicable  Medication Review Press photographer) Complete

## 2019-08-21 ENCOUNTER — Emergency Department (HOSPITAL_COMMUNITY)
Admission: EM | Admit: 2019-08-21 | Discharge: 2019-08-21 | Disposition: A | Discharge status: Home / Self Care | Payer: Self-pay | Attending: Emergency Medicine | Admitting: Emergency Medicine

## 2019-08-21 ENCOUNTER — Other Ambulatory Visit: Payer: Self-pay

## 2019-08-21 ENCOUNTER — Emergency Department (HOSPITAL_COMMUNITY): Payer: Self-pay

## 2019-08-21 ENCOUNTER — Encounter (HOSPITAL_COMMUNITY): Payer: Self-pay

## 2019-08-21 DIAGNOSIS — I5042 Chronic combined systolic (congestive) and diastolic (congestive) heart failure: Secondary | ICD-10-CM | POA: Insufficient documentation

## 2019-08-21 DIAGNOSIS — I252 Old myocardial infarction: Secondary | ICD-10-CM | POA: Insufficient documentation

## 2019-08-21 DIAGNOSIS — I110 Hypertensive heart disease with heart failure: Secondary | ICD-10-CM | POA: Insufficient documentation

## 2019-08-21 DIAGNOSIS — B182 Chronic viral hepatitis C: Secondary | ICD-10-CM | POA: Insufficient documentation

## 2019-08-21 DIAGNOSIS — J441 Chronic obstructive pulmonary disease with (acute) exacerbation: Secondary | ICD-10-CM | POA: Insufficient documentation

## 2019-08-21 DIAGNOSIS — I2510 Atherosclerotic heart disease of native coronary artery without angina pectoris: Secondary | ICD-10-CM | POA: Insufficient documentation

## 2019-08-21 DIAGNOSIS — F17210 Nicotine dependence, cigarettes, uncomplicated: Secondary | ICD-10-CM | POA: Insufficient documentation

## 2019-08-21 LAB — BASIC METABOLIC PANEL
Anion gap: 10 (ref 5–15)
BUN: 18 mg/dL (ref 6–20)
CO2: 32 mmol/L (ref 22–32)
Calcium: 9.6 mg/dL (ref 8.9–10.3)
Chloride: 97 mmol/L — ABNORMAL LOW (ref 98–111)
Creatinine, Ser: 1.07 mg/dL (ref 0.61–1.24)
GFR calc Af Amer: >60 mL/min (ref >60)
GFR calc non Af Amer: >60 mL/min (ref >60)
Glucose, Bld: 176 mg/dL — ABNORMAL HIGH (ref 70–99)
Potassium: 4.4 mmol/L (ref 3.5–5.1)
Sodium: 139 mmol/L (ref 135–145)

## 2019-08-21 LAB — CBC
HCT: 43.4 % (ref 39.0–52.0)
Hemoglobin: 14 g/dL (ref 13.0–17.0)
MCH: 31.6 pg (ref 26.0–34.0)
MCHC: 32.3 g/dL (ref 30.0–36.0)
MCV: 98 fL (ref 80.0–100.0)
Platelets: 307 10*3/uL (ref 150–400)
RBC: 4.43 MIL/uL (ref 4.22–5.81)
RDW: 12.1 % (ref 11.5–15.5)
WBC: 11.3 10*3/uL — ABNORMAL HIGH (ref 4.0–10.5)
nRBC: 0 % (ref 0.0–0.2)

## 2019-08-21 LAB — CULTURE, RESPIRATORY W GRAM STAIN: Special Requests: NORMAL

## 2019-08-21 LAB — TROPONIN I (HIGH SENSITIVITY)
Troponin I (High Sensitivity): 14 ng/L (ref <18)
Troponin I (High Sensitivity): 13 ng/L (ref <18)

## 2019-08-21 LAB — BRAIN NATRIURETIC PEPTIDE: B Natriuretic Peptide: 224.8 pg/mL — ABNORMAL HIGH (ref 0.0–100.0)

## 2019-08-21 MED ORDER — IPRATROPIUM-ALBUTEROL 0.5-2.5 (3) MG/3ML IN SOLN
3.0000 mL | Freq: Once | RESPIRATORY_TRACT | Status: AC
  Administered 2019-08-21: 3 mL via RESPIRATORY_TRACT
  Filled 2019-08-21: qty 3

## 2019-08-21 MED ORDER — SODIUM CHLORIDE 0.9% FLUSH: 3.0000 mL | Freq: Once | INTRAVENOUS | Status: DC

## 2019-08-21 MED ORDER — ALBUTEROL SULFATE HFA 108 (90 BASE) MCG/ACT IN AERS
6.0000 | INHALATION_SPRAY | Freq: Once | RESPIRATORY_TRACT | Status: AC
  Administered 2019-08-21: 07:00:00 6 via RESPIRATORY_TRACT
  Filled 2019-08-21: qty 6.7

## 2019-08-21 MED ORDER — DEXAMETHASONE SODIUM PHOSPHATE 10 MG/ML IJ SOLN
10.0000 mg | Freq: Once | INTRAMUSCULAR | Status: AC
  Administered 2019-08-21: 07:00:00 10 mg via INTRAMUSCULAR
  Filled 2019-08-21: qty 1

## 2019-08-21 NOTE — Discharge Instructions (Signed)
You were seen today for ongoing shortness of breath.  Use your inhaler 4 puffs every 4-6 hours for the next 12 hours.  You likely have a COPD exacerbation.  Make sure that you are continuing your Medrol Dosepak.  Additionally, you should quit smoking.  This will only make this worse.

## 2019-08-21 NOTE — ED Triage Notes (Signed)
Pt reports that he was discharged from the hospital today about 8 hours ago and returns for the same problems of CP and SOB.

## 2019-08-21 NOTE — ED Notes (Signed)
Patient verbalizes understanding of discharge instructions. Opportunity for questioning and answers were provided. Armband removed by staff, pt discharged from ED. Pt. ambulatory and discharged home.  

## 2019-08-21 NOTE — ED Provider Notes (Signed)
[facility] EMERGENCY DEPARTMENT Provider Note   CSN: [id number] Arrival date & time: 08/21/19  0014     History Chief Complaint  Patient presents with  . Shortness of Breath  . Chest Pain    Francisco [name] is a 59 y.o. male.  HPI     This is a 60 year old male with a history of COPD's, coronary artery disease, drug abuse, combined systolic and diastolic heart failure who presents with ongoing shortness of breath and chest pain.  Patient reports "I cannot walk from here to the door."  He was discharged less than 24 hours ago from an inpatient unit.  He had been admitted to the hospital after being evaluated for chest pain.  He was found to be bradycardic and hypotensive.  He is also noted to have an elevated troponin upon admission and some mild heart failure.  Patient was treated in the hospital.  At discharge she was discharged with the same medications.  He reports that he had "just 1 cigarette" after he left.  Denies any new fever.  Does report cough.  Reports that he used inhalers without relief.  Chart reviewed including discharge summary.  Patient was discharged.  He did have some persistent wheezing.  O2 sats 88 to 91% at discharge.  Lower O2 sats noted when sleeping.  Past Medical History:  Diagnosis Date  . COPD (chronic obstructive pulmonary disease) (HCC)   . Coronary artery disease    a. s/p prior PCI.  Marland Kitchen Hepatitis-C   . Hypertension   . IV drug abuse (HCC)    a. 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  . Medically noncompliant   . MI (myocardial infarction) (HCC)   . Tobacco abuse     Patient Active Problem List   Diagnosis Date Noted  . Hypotension 08/18/2019  . Acute on chronic combined systolic (congestive) and diastolic (congestive) heart failure (HCC) 08/17/2019  . 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  . Chronic obstructive pulmonary disease (HCC)   . Elevated troponin   . Unstable angina (HCC)   . Dilated cardiomyopathy (HCC)    . CAD S/P percutaneous coronary angioplasty   . ACS (acute coronary syndrome) (HCC) 04/29/2019  . Acute respiratory failure with hypoxia (HCC) 06/17/2018    Past Surgical History:  Procedure Laterality Date  . BACK SURGERY    . NECK SURGERY    . RIGHT/LEFT HEART CATH AND CORONARY ANGIOGRAPHY N/A 04/30/2019   Procedure: RIGHT/LEFT HEART CATH AND CORONARY ANGIOGRAPHY;  Surgeon: Marykay Lex, MD;  Location: [facility] INVASIVE CV LAB;  Service: Cardiovascular;  Laterality: N/A;  . stints         Family History  Problem Relation Age of Onset  . Rheum arthritis Mother     Social History   Tobacco Use  . Smoking status: Current Every Day Smoker    Packs/day: 0.75    Types: Cigarettes  . Smokeless tobacco: Never Used  Substance Use Topics  . Alcohol use: Never  . Drug use: Yes    Types: Methamphetamines    Home Medications Prior to Admission medications   Medication Sig Start Date End Date Taking? Authorizing Provider  albuterol (VENTOLIN HFA) 108 (90 Base) MCG/ACT inhaler Inhale 1-2 puffs into the lungs every 6 (six) hours as needed for wheezing or shortness of breath. 08/20/19 09/19/19 Yes Shahmehdi, Gemma Payor, MD  aspirin EC 81 MG tablet Take 81 mg by mouth daily.   Yes [provider]  atorvastatin (LIPITOR) 80 MG tablet Take 1  tablet (80 mg total) by mouth daily at 6 PM. 08/20/19 09/19/19 Yes Shahmehdi, Gemma Payor, MD  azithromycin (ZITHROMAX) 500 MG tablet Take 1 tablet (500 mg total) by mouth daily for 4 days. 08/20/19 08/24/19 Yes Shahmehdi, Gemma Payor, MD  carvedilol (COREG) 25 MG tablet Take 1.5 tablets (37.5 mg total) by mouth 2 (two) times daily with a meal. 08/20/19 09/19/19 Yes Shahmehdi, Seyed A, MD  furosemide (LASIX) 40 MG tablet Take 1 tablet (40 mg total) by mouth daily. 08/20/19 09/19/19 Yes Shahmehdi, Gemma Payor, MD  guaiFENesin (ROBITUSSIN) 100 MG/5ML SOLN Take 5 mLs (100 mg total) by mouth every 4 (four) hours as needed for up to 5 days for cough or to loosen phlegm. 08/20/19 08/25/19  Yes Shahmehdi, Seyed A, MD  ipratropium-albuterol (DUONEB) 0.5-2.5 (3) MG/3ML SOLN Take 3 mLs by nebulization 3 (three) times daily. 08/20/19 09/19/19 Yes Shahmehdi, Gemma Payor, MD  isosorbide mononitrate (IMDUR) 30 MG 24 hr tablet Take 1 tablet (30 mg total) by mouth daily. 08/21/19 09/20/19 Yes Shahmehdi, Seyed A, MD  lisinopril (ZESTRIL) 20 MG tablet Take 1 tablet (20 mg total) by mouth daily. 08/20/19 09/19/19 Yes Shahmehdi, Seyed A, MD  methylPREDNISolone (MEDROL) 4 MG tablet Take 1 tablet (4 mg total) by mouth daily. Please follow instruction for Dosepak 08/20/19  Yes Shahmehdi, Seyed A, MD  mometasone-formoterol (DULERA) 200-5 MCG/ACT AERO Inhale 2 puffs into the lungs 2 (two) times daily. 08/20/19 09/19/19 Yes Shahmehdi, Seyed A, MD  nicotine (NICODERM CQ - DOSED IN MG/24 HOURS) 14 mg/24hr patch Place 1 patch (14 mg total) onto the skin daily for 28 days. 08/20/19 09/17/19 Yes Shahmehdi, Gemma Payor, MD  nitroGLYCERIN (NITROSTAT) 0.4 MG SL tablet Place 1 tablet (0.4 mg total) under the tongue every 5 (five) minutes x 3 doses as needed for chest pain. 08/20/19  Yes Shahmehdi, Gemma Payor, MD    Allergies    Ivp dye [iodinated diagnostic agents] and Tramadol  Review of Systems   Review of Systems  Constitutional: Negative for fever.  Respiratory: Positive for cough, shortness of breath and wheezing.   Cardiovascular: Positive for chest pain. Negative for leg swelling.  Gastrointestinal: Negative for abdominal pain, nausea and vomiting.  Genitourinary: Negative for dysuria.  All other systems reviewed and are negative.   Physical Exam Updated Vital Signs BP (!) 167/104   Pulse 79   Temp 98 F (36.7 C) (Oral)   Resp (!) 23   Ht 1.829 m (6')   Wt 79.2 kg   SpO2 95%   BMI 23.68 kg/m   Physical Exam Vitals and nursing note reviewed.  Constitutional:      Appearance: He is well-developed.     Comments: Chronically ill-appearing but nontoxic  HENT:     Head: Normocephalic and atraumatic.  Eyes:     Pupils:  Pupils are equal, round, and reactive to light.  Cardiovascular:     Rate and Rhythm: Normal rate and regular rhythm.     Heart sounds: Normal heart sounds. No murmur.  Pulmonary:     Effort: Pulmonary effort is normal. Tachypnea present. No accessory muscle usage or respiratory distress.     Breath sounds: Wheezing present.     Comments: Wheezing noted at all lung fields, coughing Abdominal:     General: Bowel sounds are normal.     Palpations: Abdomen is soft.     Tenderness: There is no abdominal tenderness. There is no rebound.  Musculoskeletal:     Cervical back: Neck supple.  Right lower leg: No tenderness. No edema.     Left lower leg: No tenderness. No edema.  Lymphadenopathy:     Cervical: No cervical adenopathy.  Skin:    General: Skin is warm and dry.  Neurological:     Mental Status: He is alert and oriented to person, place, and time.  Psychiatric:        Mood and Affect: Mood normal.     ED Results / Procedures / Treatments   Labs (all labs ordered are listed, but only abnormal results are displayed) Labs Reviewed  BASIC METABOLIC PANEL - Abnormal; Notable for the following components:      Result Value   Chloride 97 (*)    Glucose, Bld 176 (*)    All other components within normal limits  CBC - Abnormal; Notable for the following components:   WBC 11.3 (*)    All other components within normal limits  BRAIN NATRIURETIC PEPTIDE - Abnormal; Notable for the following components:   B Natriuretic Peptide 224.8 (*)    All other components within normal limits  TROPONIN I (HIGH SENSITIVITY)  TROPONIN I (HIGH SENSITIVITY)    EKG None  Radiology DG Chest 2 View  Result Date: 08/21/2019 CLINICAL DATA:  59 year old male with chest pain. EXAM: CHEST - 2 VIEW COMPARISON:  Chest radiograph dated 08/17/2019. FINDINGS: No focal consolidation, pleural effusion, pneumothorax. There is hyperexpansion of the lungs with flattening of the diaphragms. The cardiac  silhouette is within normal limits. Coronary vascular calcification or stent. No acute osseous pathology. IMPRESSION: No active cardiopulmonary disease. Electronically Signed   By: Anner Crete M.D.   On: 08/21/2019 01:12    Procedures Procedures (including critical care time)  Medications Ordered in ED Medications  sodium chloride flush (NS) 0.9 % injection 3 mL (has no administration in time range)  ipratropium-albuterol (DUONEB) 0.5-2.5 (3) MG/3ML nebulizer solution 3 mL (3 mLs Nebulization Given 08/21/19 0526)  dexamethasone (DECADRON) injection 10 mg (10 mg Intramuscular Given 08/21/19 0633)  albuterol (VENTOLIN HFA) 108 (90 Base) MCG/ACT inhaler 6 puff (6 puffs Inhalation Given 08/21/19 4193)    ED Course  I have reviewed the triage vital signs and the nursing notes.  Pertinent labs & imaging results that were available during my care of the patient were reviewed by me and considered in my medical decision making (see chart for details).    MDM Rules/Calculators/A&P                       Patient presents with shortness of breath and cough.  Recent admission to the hospital for multifactorial presentation including hypoxia likely related to diastolic heart failure and COPD as well as encephalopathy and hypotension related to polypharmacy.  He is wheezing on exam and coughing.  He did smoke after discharge.  He reports compliance with medications.  I turned off his oxygen and oxygen levels are 90 to 91% on room air.  This is consistent with his discharge oxygenation.  He was given a DuoNeb.  Chest x-ray without evidence of pneumothorax or pneumonia.  This was independently reviewed by myself.  EKG without ischemic changes.  Basic lab work is reassuring.  I suspect this is COPD related.  No evidence of systemic volume overload and chest x-ray without significant pulmonary edema.  On recheck, patient is resting comfortably.  O2 sats are maintaining in the mid 90s.  Aeration is better with  diminished wheezing.  Patient was given 6 puffs of an  albuterol inhaler.  I also gave him 1 dose of IM Decadron to ensure that he had steroids on board for bronchospasm.  Encouraged him to follow-up with his primary physician and continue medications per his discharge summary.  After history, exam, and medical workup I feel the patient has been appropriately medically screened and is safe for discharge home. Pertinent diagnoses were discussed with the patient. Patient was given return precautions.   Final Clinical Impression(s) / ED Diagnoses Final diagnoses:  COPD exacerbation [facility])    Rx / DC Orders ED Discharge Orders    None       Cordero Surette, Mayer Masker, MD 08/21/19 [phone]

## 2019-08-22 ENCOUNTER — Telehealth: Payer: Self-pay | Admitting: Critical Care Medicine

## 2019-08-22 MED ORDER — AMOXICILLIN-POT CLAVULANATE 875-125 MG PO TABS: 1.0000 | ORAL_TABLET | Freq: Two times a day (BID) | ORAL | 0 refills | Status: DC

## 2019-08-22 NOTE — Telephone Encounter (Signed)
Positive sputum culture for H. Influenza. He was previously treated with azithromycin but was back in the ED for breathing complaints yesterday.   I attempted to call him twice and left 2 messages- the second indicating that I sent a prescription for antibitics to his preferred pharmacy in his chart. Prescribing augmentin BID x 7 days.  Steffanie Dunn, DO 08/22/19 6:47 PM [facility]

## 2019-08-23 NOTE — Telephone Encounter (Signed)
Called and spoke with Patient.  Dr. Chestine Spore recommendations given.  Understanding stated.  Patient aware antibiotic called to pharmacy.  Nothing further at this time.

## 2019-08-29 ENCOUNTER — Inpatient Hospital Stay: Payer: Self-pay | Admitting: Critical Care Medicine

## 2019-08-29 NOTE — Progress Notes (Deleted)
Subjective:    Patient ID: Francisco [name], male    DOB: Jan 20, 1961, 59 y.o.   MRN: [id number]  Patient presents with shortness of breath and cough.  Recent admission to the hospital for multifactorial presentation including hypoxia likely related to diastolic heart failure and COPD as well as encephalopathy and hypotension related to polypharmacy.  He is wheezing on exam and coughing.  He did smoke after discharge.  He reports compliance with medications.  I turned off his oxygen and oxygen levels are 90 to 91% on room air.  This is consistent with his discharge oxygenation.  He was given a DuoNeb.  Chest x-ray without evidence of pneumothorax or pneumonia.  This was independently reviewed by myself.  EKG without ischemic changes.  Basic lab work is reassuring.  I suspect this is COPD related.  No evidence of systemic volume overload and chest x-ray without significant pulmonary edema.  On recheck, patient is resting comfortably.  O2 sats are maintaining in the mid 90s.  Aeration is better with diminished wheezing.  Patient was given 6 puffs of an albuterol inhaler.  I also gave him 1 dose of IM Decadron to ensure that he had steroids on board for bronchospasm.  Encouraged him to follow-up with his primary physician and continue medications per his discharge summary.   Principal Problem:   Acute on chronic combined systolic (congestive) and diastolic (congestive) heart failure (HCC) Active Problems:   Elevated troponin   Dilated cardiomyopathy (HCC)   CAD S/P percutaneous coronary angioplasty   Methamphetamine abuse (HCC)   Hypotension   History of Present Illness/ Hospital Course Francisco [name] Summary:   Brief Narrative: Per PCCM note: 59 y.o.F with PMH of HFrEF 25%, CAD s/p stent, COPD, HTN and polysubstance abuse who presented with chest pain for one week. Admitted to internal medicine and home coreg resumed, shortly after patient became altered, bradycardic and hypotensive so PCCM  consulted.  5/2:Patient was noted to have hypotension and bradycardia likely secondary to distributive shock related to his home medications as well as methamphetamine use. He was also noted to have associated acute hypoxemic respiratory failure as well as encephalopathy. This is again likely secondary to methamphetamine use, however he did have some elevated ammonia levels yesterday which have now improved after dose of lactulose. Prerenal AKI is also improving this morning with creatinine 1.2. He is noted to have persistent wheezing as well as chest tightness and cough with high amounts of sputum production this morning.  08/20/2019: Patient was seen and examined, restless like to be discharged stating lost a friend to drug overdose would like to go attend to his matters. He was manually taken off his O2 in the room he consistently satting about 91% on room air, mildly desatted to 88-89% while sleeping per nursing staff We finally have cleared the patient to be discharged.  Prescription for antibiotics, inhalers, steroids was given.Marland Kitchen He is instructed to follow-up with PCP as soon as possible.  For better blood pressure control, along with adjustment of his inhalers consultation to pulmonologist. He is instructed to quit smoking immediately, avoid any drugs including benefiting.  Detailed discharge summary:   Acute hypoxemic respiratory failure secondary to COPD exacerbation -During her hospital course treated with scheduled DuoNeb, home inhalers including albuterol, Pulmicort reinitiated prescription was given  -Prednisone 40 mg daily to initiate today... Switch to Medrol Dosepak taper -Wean oxygen as tolerated --sats about 91% on room air, mildly desats to 88-89 at rest, sleeping -Flutter valve/I-S and up in  chair  Distributive shock secondary to polypharmacy -This has currently resolved and patient has been transferred from critical care unit with elevated blood pressures this  morning -Labetalol was utilized as needed -Resume home carvedilol and Imdur -Patient has been weaned off norepinephrine  Acute encephalopathy-resolved -Likely secondary to shock from above -He is back to baseline with improvement in ammonia levels noted as well   Prerenal AKI-nonoliguric, improving -Resolved -  creatinine 1.2 and baseline creatinine noted to be 0.9 -IV fluids reordered, time-limited for 12 hours with a.m. recheck of labs -Avoid nephrotoxic agents -Good urine output noted  History of heart failure with reduced EF secondary to nonischemic cardiomyopathy/CAD -Blood pressure has improved and Coreg will be resumed -Statin and aspirin daily -2D echocardiogram with LVEF 50-55% and grade 2 diastolic dysfunction -Patient was monitored restarted on Lasix p.o. at home  Tobacco abuse/drug abuse amphetamine -Counseled on cessation with nicotine patch daily NicoDerm patch has been prescribed -Patient was instructed to abstain from any drug use including amphetamine         Review of Systems     Objective:   Physical Exam        Assessment & Plan:

## 2019-12-12 ENCOUNTER — Emergency Department (HOSPITAL_COMMUNITY): Admission: EM | Admit: 2019-12-12 | Discharge: 2019-12-12 | Discharge status: Against Medical Advice | Payer: Self-pay

## 2019-12-12 ENCOUNTER — Other Ambulatory Visit: Payer: Self-pay

## 2019-12-16 ENCOUNTER — Other Ambulatory Visit: Payer: Self-pay

## 2019-12-16 ENCOUNTER — Emergency Department (HOSPITAL_COMMUNITY)
Admission: EM | Admit: 2019-12-16 | Discharge: 2019-12-16 | Disposition: A | Discharge status: Home / Self Care | Payer: Self-pay | Attending: Emergency Medicine | Admitting: Emergency Medicine

## 2019-12-16 ENCOUNTER — Emergency Department (HOSPITAL_COMMUNITY): Payer: Self-pay

## 2019-12-16 ENCOUNTER — Encounter (HOSPITAL_COMMUNITY): Payer: Self-pay | Admitting: Emergency Medicine

## 2019-12-16 DIAGNOSIS — R079 Chest pain, unspecified: Secondary | ICD-10-CM | POA: Insufficient documentation

## 2019-12-16 DIAGNOSIS — J449 Chronic obstructive pulmonary disease, unspecified: Secondary | ICD-10-CM | POA: Insufficient documentation

## 2019-12-16 DIAGNOSIS — I2510 Atherosclerotic heart disease of native coronary artery without angina pectoris: Secondary | ICD-10-CM | POA: Insufficient documentation

## 2019-12-16 DIAGNOSIS — I10 Essential (primary) hypertension: Secondary | ICD-10-CM | POA: Insufficient documentation

## 2019-12-16 DIAGNOSIS — R0602 Shortness of breath: Secondary | ICD-10-CM | POA: Insufficient documentation

## 2019-12-16 DIAGNOSIS — F17210 Nicotine dependence, cigarettes, uncomplicated: Secondary | ICD-10-CM | POA: Insufficient documentation

## 2019-12-16 DIAGNOSIS — Z79899 Other long term (current) drug therapy: Secondary | ICD-10-CM | POA: Insufficient documentation

## 2019-12-16 LAB — BASIC METABOLIC PANEL
Anion gap: 6 (ref 5–15)
BUN: 11 mg/dL (ref 6–20)
CO2: 28 mmol/L (ref 22–32)
Calcium: 9.2 mg/dL (ref 8.9–10.3)
Chloride: 102 mmol/L (ref 98–111)
Creatinine, Ser: 0.94 mg/dL (ref 0.61–1.24)
GFR calc Af Amer: >60 mL/min (ref >60)
GFR calc non Af Amer: >60 mL/min (ref >60)
Glucose, Bld: 112 mg/dL — ABNORMAL HIGH (ref 70–99)
Potassium: 3.9 mmol/L (ref 3.5–5.1)
Sodium: 136 mmol/L (ref 135–145)

## 2019-12-16 LAB — CBC
HCT: 47.6 % (ref 39.0–52.0)
Hemoglobin: 15.6 g/dL (ref 13.0–17.0)
MCH: 31.3 pg (ref 26.0–34.0)
MCHC: 32.8 g/dL (ref 30.0–36.0)
MCV: 95.4 fL (ref 80.0–100.0)
Platelets: 317 10*3/uL (ref 150–400)
RBC: 4.99 MIL/uL (ref 4.22–5.81)
RDW: 12.8 % (ref 11.5–15.5)
WBC: 8.2 10*3/uL (ref 4.0–10.5)
nRBC: 0 % (ref 0.0–0.2)

## 2019-12-16 LAB — TROPONIN I (HIGH SENSITIVITY)
Troponin I (High Sensitivity): 30 ng/L — ABNORMAL HIGH (ref <18)
Troponin I (High Sensitivity): 31 ng/L — ABNORMAL HIGH (ref <18)

## 2019-12-16 MED ORDER — PREDNISONE 20 MG PO TABS
60.0000 mg | ORAL_TABLET | Freq: Once | ORAL | Status: AC
  Administered 2019-12-16: 60 mg via ORAL
  Filled 2019-12-16: qty 3

## 2019-12-16 MED ORDER — NAPROXEN 250 MG PO TABS: 500.0000 mg | ORAL_TABLET | Freq: Once | ORAL | Status: DC

## 2019-12-16 MED ORDER — PREDNISONE 20 MG PO TABS: 40.0000 mg | ORAL_TABLET | Freq: Every day | ORAL | 0 refills | Status: AC

## 2019-12-16 MED ORDER — ALBUTEROL SULFATE HFA 108 (90 BASE) MCG/ACT IN AERS
2.0000 | INHALATION_SPRAY | Freq: Once | RESPIRATORY_TRACT | Status: AC
  Administered 2019-12-16: 2 via RESPIRATORY_TRACT
  Filled 2019-12-16: qty 6.7

## 2019-12-16 NOTE — ED Notes (Signed)
Pt refused discharge instructions per conversation with dr. Pilar Plate. Pt left at that time and this rn unable to discharge pt.

## 2019-12-16 NOTE — ED Notes (Signed)
Patient transported to X-ray 

## 2019-12-16 NOTE — ED Provider Notes (Signed)
[facility]-EMERGENCY DEPT [facility] Emergency Department Provider Note MRN:  [id number]  Arrival date & time: 12/16/19     Chief Complaint   Chest Pain and Shortness of Breath   History of Present Illness   Francisco [name] is a 59 y.o. year-old male with a history of COPD presenting to the ED with chief complaint of chest pain.  Patient explains that he took a small amount of heroin last night and then overdosed.  His friend performed CPR until EMS arrived.  He was given Narcan and revived.  He refused transport and has not seen a doctor since this occurrence.  He is here because of continued chest pain and soreness since he received CPR.  Endorsing chronic shortness of breath and cough related to his COPD.  Denies dizziness or diaphoresis, no nausea or vomiting, no headache or vision change, no abdominal pain, no other complaints.  Pain is moderate, constant, worse with motion or palpation.  Review of Systems  A complete 10 system review of systems was obtained and all systems are negative except as noted in the HPI and PMH.   [name] Health History    Past Medical History:  Diagnosis Date  . COPD (chronic obstructive pulmonary disease) (HCC)   . Coronary artery disease    a. s/p prior PCI.  Marland Kitchen Hepatitis-C   . Hypertension   . IV drug abuse (HCC)    a. 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  . Medically noncompliant   . MI (myocardial infarction) (HCC)   . Tobacco abuse     Past Surgical History:  Procedure Laterality Date  . BACK SURGERY    . NECK SURGERY    . RIGHT/LEFT HEART CATH AND CORONARY ANGIOGRAPHY N/A 04/30/2019   Procedure: RIGHT/LEFT HEART CATH AND CORONARY ANGIOGRAPHY;  Surgeon: Marykay Lex, MD;  Location: [facility] INVASIVE CV LAB;  Service: Cardiovascular;  Laterality: N/A;  . stints      Family History  Problem Relation Age of Onset  . Rheum arthritis Mother     Social History   Socioeconomic History  . Marital status: Divorced    Spouse name:  Not on file  . Number of children: Not on file  . Years of education: Not on file  . Highest education level: Not on file  Occupational History  . Not on file  Tobacco Use  . Smoking status: Current Every Day Smoker    Packs/day: 0.75    Types: Cigarettes  . Smokeless tobacco: Never Used  Vaping Use  . Vaping Use: Never used  Substance and Sexual Activity  . Alcohol use: Never  . Drug use: Yes    Types: Methamphetamines    Comment: heroin  . Sexual activity: Not on file  Other Topics Concern  . Not on file  Social History Narrative  . Not on file   Social Determinants of Health   Financial Resource Strain:   . Difficulty of Paying Living Expenses: Not on file  Food Insecurity:   . Worried About Programme researcher, broadcasting/film/video in the Last Year: Not on file  . Ran Out of Food in the Last Year: Not on file  Transportation Needs:   . Lack of Transportation (Medical): Not on file  . Lack of Transportation (Non-Medical): Not on file  Physical Activity:   . Days of Exercise per Week: Not on file  . Minutes of Exercise per Session: Not on file  Stress:   . Feeling of Stress : Not on file  Social Connections:   . Frequency of Communication with Friends and Family: Not on file  . Frequency of Social Gatherings with Friends and Family: Not on file  . Attends Religious Services: Not on file  . Active Member of Clubs or Organizations: Not on file  . Attends Banker Meetings: Not on file  . Marital Status: Not on file  Intimate Partner Violence:   . Fear of Current or Ex-Partner: Not on file  . Emotionally Abused: Not on file  . Physically Abused: Not on file  . Sexually Abused: Not on file     Physical Exam   Vitals:   12/16/19 2100 12/16/19 2115  BP: (!) 201/130 (!) 207/131  Pulse: (!) 110 (!) 111  Resp: (!) 25 (!) 24  Temp:    SpO2: 93% 94%    CONSTITUTIONAL: Well-appearing, NAD NEURO:  Alert and oriented x 3, no focal deficits EYES:  eyes equal and  reactive ENT/NECK:  no LAD, no JVD CARDIO: Regular rate, well-perfused, normal S1 and S2, tender to palpation to the anterior ribs PULM:  CTAB no wheezing or rhonchi GI/GU:  normal bowel sounds, non-distended, non-tender MSK/SPINE:  No gross deformities, no edema SKIN:  no rash, atraumatic PSYCH:  Appropriate speech and behavior  *Additional and/or pertinent findings included in MDM below  Diagnostic and Interventional Summary    EKG Interpretation  Date/Time:  Sunday December 16 2019 12:56:26 EDT Ventricular Rate:  108 PR Interval:  130 QRS Duration: 84 QT Interval:  352 QTC Calculation: 471 R Axis:   76 Text Interpretation: Sinus tachycardia Right atrial enlargement Nonspecific T wave abnormality Abnormal ECG Confirmed by Kennis Carina [phone]) on 12/16/2019 7:20:40 PM      Labs Reviewed  BASIC METABOLIC PANEL - Abnormal; Notable for the following components:      Result Value   Glucose, Bld 112 (*)    All other components within normal limits  TROPONIN I (HIGH SENSITIVITY) - Abnormal; Notable for the following components:   Troponin I (High Sensitivity) 30 (*)    All other components within normal limits  TROPONIN I (HIGH SENSITIVITY) - Abnormal; Notable for the following components:   Troponin I (High Sensitivity) 31 (*)    All other components within normal limits  CBC    DG Chest 2 View  Final Result      Medications  predniSONE (DELTASONE) tablet 60 mg (60 mg Oral Given 12/16/19 1929)  albuterol (VENTOLIN HFA) 108 (90 Base) MCG/ACT inhaler 2 puff (2 puffs Inhalation Given 12/16/19 1930)     Procedures  /  Critical Care Procedures  ED Course and Medical Decision Making  I have reviewed the triage vital signs, the nursing notes, and pertinent available records from the EMR.  Listed above are laboratory and imaging tests that I personally ordered, reviewed, and interpreted and then considered in my medical decision making (see below for details).  Suspect pain is  related to rib bruising or possibly fractures from CPR received last night.  Chest x-ray is without pneumothorax, no obvious displaced fractures.  Patient has reassuring vital signs, no hypoxia.  EKG is showing some nonspecific changes, first troponin is 30.  Seems unlikely that this pain which is very reproducible on exam it is cardiac in etiology, will follow up second troponin.     Second troponin is flat.  Options discussed with patient, who is not interested in admission at this time.  Wishes to be discharged.  Strict return precautions.  Stressed the  importance of refraining from opioid use in the future.  Elmer Sow. Pilar Plate, MD [facility] Emergency Medicine [facility] [email or web address]  Final Clinical Impressions(s) / ED Diagnoses  No diagnosis found.  ED Discharge Orders         Ordered    predniSONE (DELTASONE) 20 MG tablet  Daily        12/16/19 2142           Discharge Instructions Discussed with and Provided to Patient:     Discharge Instructions     You were evaluated in the Emergency Department and after careful evaluation, we did not find any emergent condition requiring admission or further testing in the hospital.  Your exam/testing today was overall reassuring.  Please return to the Emergency Department if you experience any worsening of your condition.  Thank you for allowing [place] to be a part of your care.        Sabas Sous, MD 12/16/19 [phone]

## 2019-12-16 NOTE — Discharge Instructions (Signed)
You were evaluated in the Emergency Department and after careful evaluation, we did not find any emergent condition requiring admission or further testing in the hospital.  Your exam/testing today was overall reassuring.  Please return to the Emergency Department if you experience any worsening of your condition.  Thank you for allowing us to be a part of your care.  

## 2019-12-16 NOTE — ED Triage Notes (Signed)
Pt states he overdosed on heroin Monday and had CPR performed.  Reports pain to center of chest and SOB.  Pt believes he has rib fractures.

## 2020-01-03 ENCOUNTER — Emergency Department (HOSPITAL_COMMUNITY)
Admission: EM | Admit: 2020-01-03 | Discharge: 2020-01-04 | Disposition: A | Discharge status: Home / Self Care | Payer: Self-pay | Attending: Emergency Medicine | Admitting: Emergency Medicine

## 2020-01-03 ENCOUNTER — Emergency Department (HOSPITAL_COMMUNITY): Payer: Self-pay

## 2020-01-03 ENCOUNTER — Other Ambulatory Visit: Payer: Self-pay

## 2020-01-03 DIAGNOSIS — I2510 Atherosclerotic heart disease of native coronary artery without angina pectoris: Secondary | ICD-10-CM | POA: Insufficient documentation

## 2020-01-03 DIAGNOSIS — F17210 Nicotine dependence, cigarettes, uncomplicated: Secondary | ICD-10-CM | POA: Insufficient documentation

## 2020-01-03 DIAGNOSIS — I110 Hypertensive heart disease with heart failure: Secondary | ICD-10-CM | POA: Insufficient documentation

## 2020-01-03 DIAGNOSIS — Z7982 Long term (current) use of aspirin: Secondary | ICD-10-CM | POA: Insufficient documentation

## 2020-01-03 DIAGNOSIS — Z20822 Contact with and (suspected) exposure to covid-19: Secondary | ICD-10-CM | POA: Insufficient documentation

## 2020-01-03 DIAGNOSIS — I509 Heart failure, unspecified: Secondary | ICD-10-CM | POA: Insufficient documentation

## 2020-01-03 DIAGNOSIS — Z79899 Other long term (current) drug therapy: Secondary | ICD-10-CM | POA: Insufficient documentation

## 2020-01-03 DIAGNOSIS — J441 Chronic obstructive pulmonary disease with (acute) exacerbation: Secondary | ICD-10-CM | POA: Insufficient documentation

## 2020-01-03 LAB — BASIC METABOLIC PANEL
Anion gap: 10 (ref 5–15)
BUN: 8 mg/dL (ref 6–20)
CO2: 28 mmol/L (ref 22–32)
Calcium: 9.2 mg/dL (ref 8.9–10.3)
Chloride: 100 mmol/L (ref 98–111)
Creatinine, Ser: 0.95 mg/dL (ref 0.61–1.24)
GFR calc Af Amer: >60 mL/min (ref >60)
GFR calc non Af Amer: >60 mL/min (ref >60)
Glucose, Bld: 142 mg/dL — ABNORMAL HIGH (ref 70–99)
Potassium: 3.5 mmol/L (ref 3.5–5.1)
Sodium: 138 mmol/L (ref 135–145)

## 2020-01-03 LAB — CBC
HCT: 45.1 % (ref 39.0–52.0)
Hemoglobin: 14.8 g/dL (ref 13.0–17.0)
MCH: 31.5 pg (ref 26.0–34.0)
MCHC: 32.8 g/dL (ref 30.0–36.0)
MCV: 96 fL (ref 80.0–100.0)
Platelets: 243 10*3/uL (ref 150–400)
RBC: 4.7 MIL/uL (ref 4.22–5.81)
RDW: 12.6 % (ref 11.5–15.5)
WBC: 11 10*3/uL — ABNORMAL HIGH (ref 4.0–10.5)
nRBC: 0 % (ref 0.0–0.2)

## 2020-01-03 LAB — BRAIN NATRIURETIC PEPTIDE: B Natriuretic Peptide: 1047.4 pg/mL — ABNORMAL HIGH (ref 0.0–100.0)

## 2020-01-03 NOTE — ED Triage Notes (Signed)
Pt with hx of COPD and CHF presents for shob x 3-4 days after being out of all of his medication x 2 months.

## 2020-01-04 LAB — SARS CORONAVIRUS 2 BY RT PCR (HOSPITAL ORDER, PERFORMED IN ~~LOC~~ HOSPITAL LAB): SARS Coronavirus 2: NEGATIVE

## 2020-01-04 LAB — TROPONIN I (HIGH SENSITIVITY)
Troponin I (High Sensitivity): 52 ng/L — ABNORMAL HIGH (ref <18)
Troponin I (High Sensitivity): 59 ng/L — ABNORMAL HIGH (ref <18)

## 2020-01-04 MED ORDER — MOMETASONE FURO-FORMOTEROL FUM 200-5 MCG/ACT IN AERO: 2.0000 | INHALATION_SPRAY | Freq: Two times a day (BID) | RESPIRATORY_TRACT | Status: DC

## 2020-01-04 MED ORDER — ATORVASTATIN CALCIUM 80 MG PO TABS: 80.0000 mg | ORAL_TABLET | Freq: Every day | ORAL | 0 refills | Status: DC

## 2020-01-04 MED ORDER — ASPIRIN EC 81 MG PO TBEC
81.0000 mg | DELAYED_RELEASE_TABLET | Freq: Every day | ORAL | 0 refills | Status: DC
Start: 2020-01-04 — End: 2021-04-08

## 2020-01-04 MED ORDER — CARVEDILOL 3.125 MG PO TABS
25.0000 mg | ORAL_TABLET | Freq: Once | ORAL | Status: AC
  Administered 2020-01-04: 25 mg via ORAL
  Filled 2020-01-04: qty 8

## 2020-01-04 MED ORDER — PREDNISONE 10 MG PO TABS: 40.0000 mg | ORAL_TABLET | Freq: Every day | ORAL | 0 refills | Status: DC

## 2020-01-04 MED ORDER — IPRATROPIUM-ALBUTEROL 0.5-2.5 (3) MG/3ML IN SOLN: 3.0000 mL | Freq: Three times a day (TID) | RESPIRATORY_TRACT | 0 refills | Status: DC

## 2020-01-04 MED ORDER — CARVEDILOL 25 MG PO TABS: 25.0000 mg | ORAL_TABLET | Freq: Two times a day (BID) | ORAL | 0 refills | Status: DC

## 2020-01-04 MED ORDER — ALBUTEROL SULFATE HFA 108 (90 BASE) MCG/ACT IN AERS
2.0000 | INHALATION_SPRAY | Freq: Once | RESPIRATORY_TRACT | Status: AC
  Administered 2020-01-04: 2 via RESPIRATORY_TRACT
  Filled 2020-01-04: qty 6.7

## 2020-01-04 MED ORDER — PREDNISONE 20 MG PO TABS
40.0000 mg | ORAL_TABLET | Freq: Once | ORAL | Status: AC
  Administered 2020-01-04: 40 mg via ORAL
  Filled 2020-01-04: qty 2

## 2020-01-04 MED ORDER — FUROSEMIDE 10 MG/ML IJ SOLN
40.0000 mg | Freq: Once | INTRAMUSCULAR | Status: AC
  Administered 2020-01-04: 40 mg via INTRAVENOUS
  Filled 2020-01-04: qty 4

## 2020-01-04 MED ORDER — ALBUTEROL SULFATE HFA 108 (90 BASE) MCG/ACT IN AERS: 1.0000 | INHALATION_SPRAY | Freq: Four times a day (QID) | RESPIRATORY_TRACT | 0 refills | Status: DC | PRN

## 2020-01-04 MED ORDER — FUROSEMIDE 40 MG PO TABS: 40.0000 mg | ORAL_TABLET | Freq: Every day | ORAL | 0 refills | Status: DC

## 2020-01-04 NOTE — ED Provider Notes (Signed)
Pt signed out by Dr. Madilyn Hook.  Troponins stable in the 50s.  Pt able to ambulate with O2 sats staying in the upper 90s.  SW and CM have spoken with patient regarding pcp.  Covid neg.  Pt is stable for d/c.  Return if worse.   Jacalyn Lefevre, MD 01/04/20 1820

## 2020-01-04 NOTE — TOC Initial Note (Signed)
Transition of Care [facility]) - Initial/Assessment Note    Patient Details  Name: Francisco [name] MRN: [id number] Date of Birth: January 12, 1961  [facility]) CM/SW Contact:    Lockie Pares, RN Phone Number: 01/04/2020, 4:20 PM  Clinical Narrative:                 Patient comes in the ED due to shortness of Breath, has known COPD and heart failure. Does use heroin and methamphetamine routinely. NO PCP, had run out of medications. PCP assigned CHW.  Will need to make an appointment before using pharmacy   MATCH if indicated, but CM will assess as DC time nearer.   Expected Discharge Plan: Home/Self Care Barriers to Discharge: Continued Medical Work up   Patient Goals and CMS Choice        Expected Discharge Plan and Services Expected Discharge Plan: Home/Self Care                                              Prior Living Arrangements/Services     Patient language and need for interpreter reviewed:: Yes        Need for Family Participation in Patient Care: Yes (Comment) Care giver support system in place?: Yes (comment)   Criminal Activity/Legal Involvement Pertinent to Current Situation/Hospitalization: No - Comment as needed  Activities of Daily Living      Permission Sought/Granted                  Emotional Assessment Appearance:: Appears older than stated age     Orientation: : Oriented to Self, Oriented to Place, Oriented to  Time, Oriented to Situation Alcohol / Substance Use: Illicit Drugs, Tobacco Use Psych Involvement: No (comment)  Admission diagnosis:  COPD trouble breathing Patient Active Problem List   Diagnosis Date Noted  . Hypotension 08/18/2019  . Acute on chronic combined systolic (congestive) and diastolic (congestive) heart failure (HCC) 08/17/2019  . 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  . Chronic obstructive pulmonary disease (HCC)   . Elevated troponin   . Unstable angina (HCC)   . Dilated cardiomyopathy (HCC)    . CAD S/P percutaneous coronary angioplasty   . ACS (acute coronary syndrome) (HCC) 04/29/2019  . Acute respiratory failure with hypoxia (HCC) 06/17/2018   PCP:  Patient, No Pcp Per Pharmacy:   [id number] [place], [place] - [address] AT [facility] [address] [place] [address] Phone: [phone] Fax: [phone]  [facility] [place], [place] - [address] [place] [address] Phone: [phone] Fax: [phone]     Social Determinants of Health (SDOH) Interventions    Readmission Risk Interventions Readmission Risk Prevention Plan 08/20/2019  Transportation Screening Complete  PCP or Specialist Appt within 3-5 Days Complete  HRI or Home Care Consult Complete  Social Work Consult for Recovery Care Planning/Counseling Complete  Palliative Care Screening Not Applicable  Medication Review Oceanographer) Complete

## 2020-01-04 NOTE — ED Notes (Signed)
Patient states his chest pain is better after taking his home nitro.

## 2020-01-04 NOTE — ED Notes (Addendum)
Patient stopped this writer to ask if he could take his home nitro since he had been here almost twleve hours with chest pain. Charge RN [employer] notified. Elliot Gurney states she cant not advise that. Patient notified. He states "well im going to take it anyway ive been here too long yall going let me f around and die." explained that we cant advise him to take it but he states he understand and he is going to take it anyway.

## 2020-01-04 NOTE — Progress Notes (Signed)
ReDS Clip Diuretic Study Pt study # [id number]  Your patient is in the Blinded arm of the ReDS Clip Diuretic study.  Your patient has had a ReDS reading and the reading has been transmitted to the cloud.   Thank You  The research team   Sharen Hones, PharmD, BCPS Phone [phone] 01/04/2020       3:46 PM  Please check [email or web address] for unit-specific pharmacist phone numbers

## 2020-01-04 NOTE — ED Notes (Signed)
Pt ambulated on the unit with O2 monitor on with this RN. He stayed between 96%-100% the whole time & showed no respiratory distress.

## 2020-01-04 NOTE — ED Provider Notes (Signed)
[facility] EMERGENCY DEPARTMENT Provider Note   CSN: [id number] Arrival date & time: 01/03/20  1833     History Chief Complaint  Patient presents with  . Shortness of Breath    Francisco [name] is a 59 y.o. male.  The history is provided by the patient and medical records. No language interpreter was used.  Shortness of Breath  Francisco [name] is a 59 y.o. male who presents to the Emergency Department complaining of breath. He has a history of COPD, CHF and coronary artery disease and presents the emergency department for shortness of breath for the last several days. He ran out of all of his home medications one month ago and is noticed over the last few days increased shortness of breath with dyspnea on exertion and cough productive of sputum. He also reports increased bilateral lower extremity edema. He has occasional chest pressure, none currently. Denies any fevers, hemoptysis, nausea, vomiting, abdominal pain. No known sick contacts. He has not been immunized for COVID-19. He does smoke tobacco. No alcohol. He does use methamphetamine and heroin by IV use. He usually uses every three days, last use was yesterday. Denies any SI.    Past Medical History:  Diagnosis Date  . COPD (chronic obstructive pulmonary disease) (HCC)   . Coronary artery disease    a. s/p prior PCI.  Marland Kitchen Hepatitis-C   . Hypertension   . IV drug abuse (HCC)    a. 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  . Medically noncompliant   . MI (myocardial infarction) (HCC)   . Tobacco abuse     Patient Active Problem List   Diagnosis Date Noted  . Hypotension 08/18/2019  . Acute on chronic combined systolic (congestive) and diastolic (congestive) heart failure (HCC) 08/17/2019  . 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  . Chronic obstructive pulmonary disease (HCC)   . Elevated troponin   . Unstable angina (HCC)   . Dilated cardiomyopathy (HCC)   . CAD S/P percutaneous coronary angioplasty    . ACS (acute coronary syndrome) (HCC) 04/29/2019  . Acute respiratory failure with hypoxia (HCC) 06/17/2018    Past Surgical History:  Procedure Laterality Date  . BACK SURGERY    . NECK SURGERY    . RIGHT/LEFT HEART CATH AND CORONARY ANGIOGRAPHY N/A 04/30/2019   Procedure: RIGHT/LEFT HEART CATH AND CORONARY ANGIOGRAPHY;  Surgeon: Marykay Lex, MD;  Location: [facility] INVASIVE CV LAB;  Service: Cardiovascular;  Laterality: N/A;  . stints         Family History  Problem Relation Age of Onset  . Rheum arthritis Mother     Social History   Tobacco Use  . Smoking status: Current Every Day Smoker    Packs/day: 0.75    Types: Cigarettes  . Smokeless tobacco: Never Used  Vaping Use  . Vaping Use: Never used  Substance Use Topics  . Alcohol use: Never  . Drug use: Yes    Types: Methamphetamines    Comment: heroin    Home Medications Prior to Admission medications   Medication Sig Start Date End Date Taking? Authorizing Provider  albuterol (VENTOLIN HFA) 108 (90 Base) MCG/ACT inhaler Inhale 1-2 puffs into the lungs every 6 (six) hours as needed for wheezing or shortness of breath. 01/04/20 02/03/20  Tilden Fossa, MD  aspirin EC 81 MG tablet Take 1 tablet (81 mg total) by mouth daily. 01/04/20   Tilden Fossa, MD  atorvastatin (LIPITOR) 80 MG tablet Take 1 tablet (80 mg total) by mouth daily at  6 PM. 01/04/20 02/03/20  Tilden Fossa, MD  carvedilol (COREG) 25 MG tablet Take 1 tablet (25 mg total) by mouth 2 (two) times daily with a meal. 01/04/20 02/03/20  Tilden Fossa, MD  furosemide (LASIX) 40 MG tablet Take 1 tablet (40 mg total) by mouth daily. 01/04/20 02/03/20  Tilden Fossa, MD  ipratropium-albuterol (DUONEB) 0.5-2.5 (3) MG/3ML SOLN Take 3 mLs by nebulization 3 (three) times daily. 01/04/20 02/03/20  Tilden Fossa, MD  isosorbide mononitrate (IMDUR) 30 MG 24 hr tablet Take 1 tablet (30 mg total) by mouth daily. 08/21/19 09/20/19  Shahmehdi, Gemma Payor, MD  lisinopril  (ZESTRIL) 20 MG tablet Take 1 tablet (20 mg total) by mouth daily. 08/20/19 09/19/19  ShahmehdiGemma Payor, MD  methylPREDNISolone (MEDROL) 4 MG tablet Take 1 tablet (4 mg total) by mouth daily. Please follow instruction for Dosepak 08/20/19   Shahmehdi, Gemma Payor, MD  mometasone-formoterol (DULERA) 200-5 MCG/ACT AERO Inhale 2 puffs into the lungs 2 (two) times daily. 01/04/20 02/03/20  Tilden Fossa, MD  nitroGLYCERIN (NITROSTAT) 0.4 MG SL tablet Place 1 tablet (0.4 mg total) under the tongue every 5 (five) minutes x 3 doses as needed for chest pain. 08/20/19   Shahmehdi, Gemma Payor, MD  predniSONE (DELTASONE) 10 MG tablet Take 4 tablets (40 mg total) by mouth daily. 01/04/20   Tilden Fossa, MD    Allergies    Ivp dye [iodinated diagnostic agents] and Tramadol  Review of Systems   Review of Systems  Respiratory: Positive for shortness of breath.   All other systems reviewed and are negative.   Physical Exam Updated Vital Signs BP (!) 185/120   Pulse (!) 115   Temp 97.8 F (36.6 C) (Axillary)   Resp (!) 27   Ht 6' (1.829 m)   Wt 78.9 kg   SpO2 95%   BMI 23.60 kg/m   Physical Exam Vitals and nursing note reviewed.  Constitutional:      Appearance: He is well-developed.  HENT:     Head: Normocephalic and atraumatic.  Cardiovascular:     Rate and Rhythm: Regular rhythm. Tachycardia present.     Heart sounds: No murmur heard.   Pulmonary:     Effort: Pulmonary effort is normal. No respiratory distress.     Comments: Occasional wheezes bilaterally Abdominal:     Palpations: Abdomen is soft.     Tenderness: There is no abdominal tenderness. There is no guarding or rebound.  Musculoskeletal:        General: No tenderness.     Comments: 1 to 2+ edema to bilateral lower extremities  Skin:    General: Skin is warm and dry.  Neurological:     Mental Status: He is alert and oriented to person, place, and time.  Psychiatric:        Behavior: Behavior normal.     ED Results /  Procedures / Treatments   Labs (all labs ordered are listed, but only abnormal results are displayed) Labs Reviewed  BASIC METABOLIC PANEL - Abnormal; Notable for the following components:      Result Value   Glucose, Bld 142 (*)    All other components within normal limits  CBC - Abnormal; Notable for the following components:   WBC 11.0 (*)    All other components within normal limits  BRAIN NATRIURETIC PEPTIDE - Abnormal; Notable for the following components:   B Natriuretic Peptide 1,047.4 (*)    All other components within normal limits  SARS CORONAVIRUS 2 BY RT  PCR [facility] ORDER, PERFORMED IN [facility] LAB)  TROPONIN I (HIGH SENSITIVITY)    EKG EKG Interpretation  Date/Time:  Thursday January 03 2020 18:57:26 EDT Ventricular Rate:  123 PR Interval:  124 QRS Duration: 86 QT Interval:  332 QTC Calculation: 475 R Axis:   57 Text Interpretation: Sinus tachycardia Right atrial enlargement ST & T wave abnormality, consider lateral ischemia Abnormal ECG Confirmed by Tilden Fossa [phone]) on 01/04/2020 10:17:58 AM   Radiology DG Chest 2 View  Result Date: 01/03/2020 CLINICAL DATA:  Shortness of breath EXAM: CHEST - 2 VIEW COMPARISON:  Chest x-ray 12/16/2019, CT chest 11/04/2018 FINDINGS: The heart size and mediastinal contours are within normal limits. No focal consolidation. No pulmonary edema. Persistent blunting of bilateral costophrenic angles with no definite pleural effusion. No pneumothorax. No acute osseous abnormality. Similar multilevel upper to midthoracic spine vertebral body height loss. IMPRESSION: No active cardiopulmonary disease. Electronically Signed   By: Tish Frederickson M.D.   On: 01/03/2020 19:30    Procedures Procedures (including critical care time)  Medications Ordered in ED Medications  albuterol (VENTOLIN HFA) 108 (90 Base) MCG/ACT inhaler 2 puff (2 puffs Inhalation Given 01/04/20 1454)  predniSONE (DELTASONE) tablet 40 mg (40 mg Oral  Given 01/04/20 1454)  furosemide (LASIX) injection 40 mg (40 mg Intravenous Given 01/04/20 1454)  carvedilol (COREG) tablet 25 mg (25 mg Oral Given 01/04/20 1453)    ED Course  I have reviewed the triage vital signs and the nursing notes.  Pertinent labs & imaging results that were available during my care of the patient were reviewed by me and considered in my medical decision making (see chart for details).    MDM Rules/Calculators/A&P                         Patient with history of COPD and CHF here for evaluation of shortness of breath and lower extremity edema over the last few days. He does have tachycardia, wheezing on examination without respiratory distress. Presentation is mixed for COPD exacerbation with an element of congestive heart failure combined. This is likely due to being out of his home medications for the last month as well as methamphetamine use. Will treat with albuterol, steroids, Lasix for diuresis and plan to reassess after treatment. Plan to obtain troponin. If patient improving after treatment may consider discharge home with close outpatient follow-up and return precautions. Patient care transferred pending troponin and reassessment.   Presentation is not consistent with PE, pneumonia, dissection.  Final Clinical Impression(s) / ED Diagnoses Final diagnoses:  Acute congestive heart failure, unspecified heart failure type (HCC)  COPD exacerbation (HCC)    Rx / DC Orders ED Discharge Orders         Ordered    albuterol (VENTOLIN HFA) 108 (90 Base) MCG/ACT inhaler  Every 6 hours PRN        01/04/20 1511    aspirin EC 81 MG tablet  Daily        01/04/20 1511    atorvastatin (LIPITOR) 80 MG tablet  Daily-1800        01/04/20 1511    carvedilol (COREG) 25 MG tablet  2 times daily with meals        01/04/20 1511    furosemide (LASIX) 40 MG tablet  Daily        01/04/20 1511    ipratropium-albuterol (DUONEB) 0.5-2.5 (3) MG/3ML SOLN  3 times daily,   Status:   Discontinued  01/04/20 1511    mometasone-formoterol (DULERA) 200-5 MCG/ACT AERO  2 times daily        01/04/20 1511    predniSONE (DELTASONE) 10 MG tablet  Daily        01/04/20 1511    ipratropium-albuterol (DUONEB) 0.5-2.5 (3) MG/3ML SOLN  3 times daily        01/04/20 1512           Tilden Fossa, MD 01/04/20 1515

## 2020-01-09 ENCOUNTER — Other Ambulatory Visit: Payer: Self-pay

## 2020-01-09 ENCOUNTER — Encounter (HOSPITAL_COMMUNITY): Payer: Self-pay | Admitting: Emergency Medicine

## 2020-01-09 ENCOUNTER — Emergency Department (HOSPITAL_COMMUNITY): Payer: Self-pay

## 2020-01-09 ENCOUNTER — Observation Stay (HOSPITAL_COMMUNITY)
Admission: EM | Admit: 2020-01-09 | Discharge: 2020-01-09 | Disposition: A | Discharge status: Home / Self Care | Payer: Self-pay | Attending: Internal Medicine | Admitting: Internal Medicine

## 2020-01-09 DIAGNOSIS — Z79899 Other long term (current) drug therapy: Secondary | ICD-10-CM | POA: Insufficient documentation

## 2020-01-09 DIAGNOSIS — Z7982 Long term (current) use of aspirin: Secondary | ICD-10-CM | POA: Insufficient documentation

## 2020-01-09 DIAGNOSIS — F1910 Other psychoactive substance abuse, uncomplicated: Secondary | ICD-10-CM | POA: Insufficient documentation

## 2020-01-09 DIAGNOSIS — Z20822 Contact with and (suspected) exposure to covid-19: Secondary | ICD-10-CM | POA: Insufficient documentation

## 2020-01-09 DIAGNOSIS — R0600 Dyspnea, unspecified: Secondary | ICD-10-CM

## 2020-01-09 DIAGNOSIS — I2510 Atherosclerotic heart disease of native coronary artery without angina pectoris: Secondary | ICD-10-CM | POA: Insufficient documentation

## 2020-01-09 DIAGNOSIS — I5023 Acute on chronic systolic (congestive) heart failure: Secondary | ICD-10-CM | POA: Insufficient documentation

## 2020-01-09 DIAGNOSIS — J441 Chronic obstructive pulmonary disease with (acute) exacerbation: Principal | ICD-10-CM | POA: Diagnosis present

## 2020-01-09 DIAGNOSIS — F17210 Nicotine dependence, cigarettes, uncomplicated: Secondary | ICD-10-CM | POA: Insufficient documentation

## 2020-01-09 DIAGNOSIS — I509 Heart failure, unspecified: Secondary | ICD-10-CM

## 2020-01-09 DIAGNOSIS — I110 Hypertensive heart disease with heart failure: Secondary | ICD-10-CM | POA: Insufficient documentation

## 2020-01-09 LAB — CBC WITH DIFFERENTIAL/PLATELET
Abs Immature Granulocytes: 0.04 10*3/uL (ref 0.00–0.07)
Basophils Absolute: 0 10*3/uL (ref 0.0–0.1)
Basophils Relative: 0 %
Eosinophils Absolute: 0 10*3/uL (ref 0.0–0.5)
Eosinophils Relative: 0 %
HCT: 47.1 % (ref 39.0–52.0)
Hemoglobin: 15.2 g/dL (ref 13.0–17.0)
Immature Granulocytes: 0 %
Lymphocytes Relative: 13 %
Lymphs Abs: 1.7 10*3/uL (ref 0.7–4.0)
MCH: 31 pg (ref 26.0–34.0)
MCHC: 32.3 g/dL (ref 30.0–36.0)
MCV: 96.1 fL (ref 80.0–100.0)
Monocytes Absolute: 1.1 10*3/uL — ABNORMAL HIGH (ref 0.1–1.0)
Monocytes Relative: 9 %
Neutro Abs: 10 10*3/uL — ABNORMAL HIGH (ref 1.7–7.7)
Neutrophils Relative %: 78 %
Platelets: 335 10*3/uL (ref 150–400)
RBC: 4.9 MIL/uL (ref 4.22–5.81)
RDW: 12.8 % (ref 11.5–15.5)
WBC: 12.9 10*3/uL — ABNORMAL HIGH (ref 4.0–10.5)
nRBC: 0 % (ref 0.0–0.2)

## 2020-01-09 LAB — TROPONIN I (HIGH SENSITIVITY)
Troponin I (High Sensitivity): 31 ng/L — ABNORMAL HIGH (ref <18)
Troponin I (High Sensitivity): 30 ng/L — ABNORMAL HIGH (ref <18)

## 2020-01-09 LAB — BASIC METABOLIC PANEL
Anion gap: 12 (ref 5–15)
BUN: 17 mg/dL (ref 6–20)
CO2: 30 mmol/L (ref 22–32)
Calcium: 9.5 mg/dL (ref 8.9–10.3)
Chloride: 99 mmol/L (ref 98–111)
Creatinine, Ser: 1.08 mg/dL (ref 0.61–1.24)
GFR calc Af Amer: >60 mL/min (ref >60)
GFR calc non Af Amer: >60 mL/min (ref >60)
Glucose, Bld: 154 mg/dL — ABNORMAL HIGH (ref 70–99)
Potassium: 3.3 mmol/L — ABNORMAL LOW (ref 3.5–5.1)
Sodium: 141 mmol/L (ref 135–145)

## 2020-01-09 LAB — I-STAT VENOUS BLOOD GAS, ED
Acid-Base Excess: 9 mmol/L — ABNORMAL HIGH (ref 0.0–2.0)
Bicarbonate: 35.1 mmol/L — ABNORMAL HIGH (ref 20.0–28.0)
Calcium, Ion: 1.18 mmol/L (ref 1.15–1.40)
HCT: 48 % (ref 39.0–52.0)
Hemoglobin: 16.3 g/dL (ref 13.0–17.0)
O2 Saturation: 71 %
Potassium: 3.5 mmol/L (ref 3.5–5.1)
Sodium: 141 mmol/L (ref 135–145)
TCO2: 37 mmol/L — ABNORMAL HIGH (ref 22–32)
pCO2, Ven: 50.2 mmHg (ref 44.0–60.0)
pH, Ven: 7.453 — ABNORMAL HIGH (ref 7.250–7.430)
pO2, Ven: 36 mmHg (ref 32.0–45.0)

## 2020-01-09 LAB — SARS CORONAVIRUS 2 BY RT PCR (HOSPITAL ORDER, PERFORMED IN ~~LOC~~ HOSPITAL LAB): SARS Coronavirus 2: NEGATIVE

## 2020-01-09 LAB — BRAIN NATRIURETIC PEPTIDE: B Natriuretic Peptide: 1301.3 pg/mL — ABNORMAL HIGH (ref 0.0–100.0)

## 2020-01-09 MED ORDER — METHYLPREDNISOLONE SODIUM SUCC 125 MG IJ SOLR
125.0000 mg | Freq: Once | INTRAMUSCULAR | Status: AC
  Administered 2020-01-09: 125 mg via INTRAVENOUS
  Filled 2020-01-09: qty 2

## 2020-01-09 MED ORDER — IPRATROPIUM-ALBUTEROL 0.5-2.5 (3) MG/3ML IN SOLN
3.0000 mL | Freq: Once | RESPIRATORY_TRACT | Status: AC
  Administered 2020-01-09: 3 mL via RESPIRATORY_TRACT
  Filled 2020-01-09: qty 3

## 2020-01-09 MED ORDER — AZITHROMYCIN 250 MG PO TABS: 500.0000 mg | ORAL_TABLET | Freq: Once | ORAL | Status: DC

## 2020-01-09 MED ORDER — MOMETASONE FURO-FORMOTEROL FUM 200-5 MCG/ACT IN AERO: 2.0000 | INHALATION_SPRAY | Freq: Two times a day (BID) | RESPIRATORY_TRACT | Status: DC

## 2020-01-09 MED ORDER — AZITHROMYCIN 250 MG PO TABS: 250.0000 mg | ORAL_TABLET | Freq: Every day | ORAL | Status: DC

## 2020-01-09 MED ORDER — ENOXAPARIN SODIUM 40 MG/0.4ML ~~LOC~~ SOLN: 40.0000 mg | SUBCUTANEOUS | Status: DC

## 2020-01-09 NOTE — Progress Notes (Signed)
   01/09/20 1759  TOC ED Mini Assessment  TOC Time spent with patient (minutes): 20  PING Used in TOC Assessment No  Admission or Readmission Diverted Yes  Interventions which prevented an admission or readmission Transportation Screening  What brought you to the Emergency Department?  SOB  Barriers to Discharge No Barriers Identified (Pt leaving AMA)  Key Contact 1 [facility] Therapist, art provided  CSW provided transportation to Pt via [employer] system

## 2020-01-09 NOTE — ED Provider Notes (Addendum)
[facility] EMERGENCY DEPARTMENT Provider Note   CSN: [id number] Arrival date & time: 01/09/20  1230      History Chief Complaint  Patient presents with  . Chest Pain  . Shortness of Breath    Francisco [name] is a 59 y.o. male with history of dilated cardiomyopathy, CAD post stent, COPD. He was seen in the ED last week for SOB, treated for COPD and CHF exacerbations. He was out of his home medications x 1 month at time of that presentation.   Presents today with persistent SOB worsening with exertion and new chest tightness x 2 days. States he is taking his lasix as prescribed. Also reports worsening of chronic cough, now productive with thick green sputum.  Denies fever, chills, nausea, vomiting, hematemesis, abdominal pain. Denies known sick contacts, is not vaccinated against COVID-19. He smokes just under 1 pack of cigarettes daily. States he uses heroin (most recent 2 weeks ago), and methamphetamine (most recent 2 days ago), but says he is ready to quit because he is afraid his drug use will kill him.    HPI     Past Medical History:  Diagnosis Date  . COPD (chronic obstructive pulmonary disease) (HCC)   . Coronary artery disease    a. s/p prior PCI.  Marland Kitchen Hepatitis-C   . Hypertension   . IV drug abuse (HCC)    a. 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  . Medically noncompliant   . MI (myocardial infarction) (HCC)   . Tobacco abuse     Patient Active Problem List   Diagnosis Date Noted  . Hypotension 08/18/2019  . Acute on chronic combined systolic (congestive) and diastolic (congestive) heart failure (HCC) 08/17/2019  . 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  . Chronic obstructive pulmonary disease (HCC)   . Elevated troponin   . Unstable angina (HCC)   . Dilated cardiomyopathy (HCC)   . CAD S/P percutaneous coronary angioplasty   . ACS (acute coronary syndrome) (HCC) 04/29/2019  . Acute respiratory failure with hypoxia (HCC) 06/17/2018     Past Surgical History:  Procedure Laterality Date  . BACK SURGERY    . NECK SURGERY    . RIGHT/LEFT HEART CATH AND CORONARY ANGIOGRAPHY N/A 04/30/2019   Procedure: RIGHT/LEFT HEART CATH AND CORONARY ANGIOGRAPHY;  Surgeon: Marykay Lex, MD;  Location: [facility] INVASIVE CV LAB;  Service: Cardiovascular;  Laterality: N/A;  . stints         Family History  Problem Relation Age of Onset  . Rheum arthritis Mother     Social History   Tobacco Use  . Smoking status: Current Every Day Smoker    Packs/day: 0.75    Types: Cigarettes  . Smokeless tobacco: Never Used  Vaping Use  . Vaping Use: Never used  Substance Use Topics  . Alcohol use: Never  . Drug use: Yes    Types: Methamphetamines    Comment: heroin    Home Medications Prior to Admission medications   Medication Sig Start Date End Date Taking? Authorizing Provider  albuterol (VENTOLIN HFA) 108 (90 Base) MCG/ACT inhaler Inhale 1-2 puffs into the lungs every 6 (six) hours as needed for wheezing or shortness of breath. 01/04/20 02/03/20  Tilden Fossa, MD  aspirin EC 81 MG tablet Take 1 tablet (81 mg total) by mouth daily. 01/04/20   Tilden Fossa, MD  atorvastatin (LIPITOR) 80 MG tablet Take 1 tablet (80 mg total) by mouth daily at 6 PM. 01/04/20 02/03/20  Tilden Fossa, MD  carvedilol (  COREG) 25 MG tablet Take 1 tablet (25 mg total) by mouth 2 (two) times daily with a meal. 01/04/20 02/03/20  Tilden Fossa, MD  furosemide (LASIX) 40 MG tablet Take 1 tablet (40 mg total) by mouth daily. 01/04/20 02/03/20  Tilden Fossa, MD  ipratropium-albuterol (DUONEB) 0.5-2.5 (3) MG/3ML SOLN Take 3 mLs by nebulization 3 (three) times daily. 01/04/20 02/03/20  Tilden Fossa, MD  isosorbide mononitrate (IMDUR) 30 MG 24 hr tablet Take 1 tablet (30 mg total) by mouth daily. 08/21/19 09/20/19  Shahmehdi, Gemma Payor, MD  lisinopril (ZESTRIL) 20 MG tablet Take 1 tablet (20 mg total) by mouth daily. 08/20/19 09/19/19  ShahmehdiGemma Payor, MD   methylPREDNISolone (MEDROL) 4 MG tablet Take 1 tablet (4 mg total) by mouth daily. Please follow instruction for Dosepak 08/20/19   Shahmehdi, Gemma Payor, MD  mometasone-formoterol (DULERA) 200-5 MCG/ACT AERO Inhale 2 puffs into the lungs 2 (two) times daily. 01/04/20 02/03/20  Tilden Fossa, MD  nitroGLYCERIN (NITROSTAT) 0.4 MG SL tablet Place 1 tablet (0.4 mg total) under the tongue every 5 (five) minutes x 3 doses as needed for chest pain. 08/20/19   Shahmehdi, Gemma Payor, MD  predniSONE (DELTASONE) 10 MG tablet Take 4 tablets (40 mg total) by mouth daily. 01/04/20   Tilden Fossa, MD    Allergies    Ivp dye [iodinated diagnostic agents] and Tramadol  Review of Systems   Review of Systems  Constitutional: Positive for fatigue. Negative for chills and fever.  HENT: Negative.   Eyes: Negative.   Respiratory: Positive for cough, chest tightness, shortness of breath and wheezing.   Cardiovascular: Positive for chest pain. Negative for palpitations.  Gastrointestinal: Negative for abdominal distention, abdominal pain, nausea and vomiting.  Genitourinary: Negative for dysuria and hematuria.  Musculoskeletal: Negative for myalgias and neck pain.  Skin: Negative.   Neurological: Positive for weakness. Negative for dizziness, syncope, light-headedness and headaches.    Physical Exam Updated Vital Signs BP 118/83   Pulse 85   Temp 97.7 F (36.5 C) (Oral)   Resp (!) 21   SpO2 93%   Physical Exam Vitals and nursing note reviewed.  HENT:     Head: Normocephalic and atraumatic.     Mouth/Throat:     Mouth: Mucous membranes are moist.     Pharynx: No oropharyngeal exudate or posterior oropharyngeal erythema.  Eyes:     General: No scleral icterus.       Right eye: No discharge.        Left eye: No discharge.     Conjunctiva/sclera: Conjunctivae normal.     Pupils: Pupils are equal, round, and reactive to light.  Cardiovascular:     Rate and Rhythm: Normal rate and regular rhythm.      Pulses: Normal pulses.     Heart sounds: Normal heart sounds.  Pulmonary:     Effort: Pulmonary effort is normal. Prolonged expiration present. No respiratory distress.     Breath sounds: Wheezing and rhonchi present.  Abdominal:     General: Bowel sounds are normal. There is no distension.     Tenderness: There is no abdominal tenderness. There is no guarding or rebound.  Musculoskeletal:        General: No deformity. Normal range of motion.     Cervical back: Neck supple.     Right lower leg: 1+ Edema present.     Left lower leg: 1+ Edema present.  Lymphadenopathy:     Cervical: No cervical adenopathy.  Skin:  General: Skin is warm and dry.  Neurological:     General: No focal deficit present.     Mental Status: He is alert. Mental status is at baseline.     Sensory: Sensation is intact.     Motor: Motor function is intact.  Psychiatric:        Mood and Affect: Mood normal.     ED Results / Procedures / Treatments   Labs (all labs ordered are listed, but only abnormal results are displayed) Labs Reviewed  CBC WITH DIFFERENTIAL/PLATELET - Abnormal; Notable for the following components:      Result Value   WBC 12.9 (*)    Neutro Abs 10.0 (*)    Monocytes Absolute 1.1 (*)    All other components within normal limits  SARS CORONAVIRUS 2 BY RT PCR (HOSPITAL ORDER, PERFORMED IN [facility])  BRAIN NATRIURETIC PEPTIDE  BASIC METABOLIC PANEL  I-STAT VENOUS BLOOD GAS, ED  TROPONIN I (HIGH SENSITIVITY)    EKG EKG Interpretation  Date/Time:  Wednesday January 09 2020 14:06:41 EDT Ventricular Rate:  91 PR Interval:    QRS Duration: 93 QT Interval:  355 QTC Calculation: 437 R Axis:   66 Text Interpretation: Sinus rhythm Right atrial enlargement Abnormal T, consider ischemia, diffuse leads Artifact in lead(s) V5 Lateral T wave inversions new from prior, no STEMI Confirmed by Alvester Chou [phone]) on 01/09/2020 2:12:30 PM   Radiology DG Chest Portable 1  View  Result Date: 01/09/2020 CLINICAL DATA:  Shortness of breath EXAM: PORTABLE CHEST 1 VIEW COMPARISON:  January 03, 2020 FINDINGS: Lungs are clear. Heart size and pulmonary vascularity are normal. No adenopathy. No bone lesions. Rudimentary cervical ribs noted. IMPRESSION: No edema or airspace opacity. Cardiac silhouette within normal limits. Electronically Signed   By: Bretta Bang III M.D.   On: 01/09/2020 13:36    Procedures Procedures (including critical care time)  Medications Ordered in ED Medications - No data to display  ED Course  I have reviewed the triage vital signs and the nursing notes.  Pertinent labs & imaging results that were available during my care of the patient were reviewed by me and considered in my medical decision making (see chart for details).  Clinical Course as of Jan 09 1612  Wed Jan 09, 2020  1605 Signed out to teaching service IM   [MT]    Clinical Course User Index [MT] Renaye Rakers, Kermit Balo, MD   MDM Rules/Calculators/A&P                         Patient with history of uncontrolled COPD,CHF, and history of COPD, and polysubstance abuse.    Vital signs stable on intake, mildly HTN 142/87.   EKG with sinus rhythm and nonspecific T-wave changes. No STEMI, confirmed by attending physician.   CXR without acute abnormality.   Initial troponin elevated to 31. BNP elevated at 1,301.3 (previously 1,047 last week). CBC with leukocytosis 12.9, increased from 11 last wek. BMP with mild hypkalemia of 3.3. I-stat VBG with pH mildly high 7.43.   Negative for COVID-19 in the ED today.   [name] lungs with diffuse wheezing, Duoneb and solumedrol administered in the ED.   [name] care staffed with supervising physician Dr. Renaye Rakers.  DDx for chest pain and SOB for this patient is broad and includes but is not limited to ACS, PE, aortic dissection, COPD exacerbation, CHF exacerbation, infectious cause such as COVID-19 or pneumonia.  Due to  concerning physical exam, poor follow up care, and concerning laboratory studies, recommend admission of this patient for further evaluation and management of COPD and CHF exacerbations. Patient needs a more recent echocardiogram, and would benefit from established care with cardiology.   Attending physician agrees with plan and has admitted the patient to the internal medicine teaching service.   Mr. Kinnard voiced understanding of the treatment plan and each of his questions were answered to his expressed satisfaction.   Francisco [name] was evaluated in Emergency Department on 01/09/2020 for the symptoms described in the history of present illness. He was evaluated in the context of the global COVID-19 pandemic, which necessitated consideration that the patient might be at risk for infection with the SARS-CoV-2 virus that causes COVID-19. Institutional protocols and algorithms that pertain to the evaluation of patients at risk for COVID-19 are in a state of rapid change based on information released by regulatory bodies including the CDC and federal and state organizations. These policies and algorithms were followed during the patient's care in the ED.   Final Clinical Impression(s) / ED Diagnoses Final diagnoses:  None    Rx / DC Orders ED Discharge Orders    None       Sherrilee Gilles 01/09/20 1651    Clint Strupp, Eugene Gavia, PA-C 01/09/20 1652    Terald Sleeper, MD 01/09/20 1718

## 2020-01-09 NOTE — Hospital Course (Addendum)
Admitted 01/09/2020  Allergies: Ivp dye [iodinated diagnostic agents] and Tramadol Pertinent Hx: CHF (dilated cardiomyopathy), COPD, CAD status post stent, HTN, IV drug use (IV meth and Heroin), medication non compliance, tobacco use  59 y.o. male p/w increased SOB, LEE,  x 1 week. CP x 2 days  * SOB: Likely mixed etiology. COPD vs CHF exacerbation in setting of medications non compliance. Received duoneb and solumedrol in ED. On RA. Diuresis**. Recurrent ED visit for similar issue, last visit 5 days ago.  May need echo and diuresis.   Consults: none Meds: *** VTE ppx: *** IVF: none Diet: ***   BP 170/104 HR 85 T97.7 93%RA COVID pending  VBG: 7.45,co2 50,hco335.1 K3.3 Cr 1. GFR nl  WBC 12.9 Hb15.2  Trop 31 BNP 1301  Echo 08/2019 grade 2 Dd50-55%  Home meds: Albuterol, ASA, Lipitor, Coreg, Lasix 40, DuoNeb, Imdur, lisinopril, Dulera, nitroglycerin, prednisone?  Orders   Lasix UDS

## 2020-01-09 NOTE — ED Notes (Signed)
Pt called this RN to the room stating he wanted to leave and to take the IV out. This RN informed the ED doctor who spoke with the pt and told him that was his right and explained we thought he should stay but the decision was his to make. PT stated he understood but wanted to leave. Pt leaving AMA.
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
## 2020-01-09 NOTE — ED Triage Notes (Signed)
Pt reports increased SOB since seen in ED 1 week ago.  Reports Chest pain x 2 days.  COPD/CHF

## 2020-01-10 ENCOUNTER — Emergency Department (HOSPITAL_COMMUNITY): Payer: Self-pay

## 2020-01-10 ENCOUNTER — Other Ambulatory Visit: Payer: Self-pay

## 2020-01-10 ENCOUNTER — Inpatient Hospital Stay (HOSPITAL_COMMUNITY)
Admission: EM | Admit: 2020-01-10 | Discharge: 2020-01-11 | DRG: 190 | Discharge status: Against Medical Advice | Payer: Self-pay | Attending: Internal Medicine | Admitting: Internal Medicine

## 2020-01-10 ENCOUNTER — Encounter (HOSPITAL_COMMUNITY): Payer: Self-pay | Admitting: *Deleted

## 2020-01-10 DIAGNOSIS — J9622 Acute and chronic respiratory failure with hypercapnia: Secondary | ICD-10-CM

## 2020-01-10 DIAGNOSIS — J9621 Acute and chronic respiratory failure with hypoxia: Secondary | ICD-10-CM

## 2020-01-10 DIAGNOSIS — Z7982 Long term (current) use of aspirin: Secondary | ICD-10-CM

## 2020-01-10 DIAGNOSIS — Z9114 Patient's other noncompliance with medication regimen: Secondary | ICD-10-CM

## 2020-01-10 DIAGNOSIS — J440 Chronic obstructive pulmonary disease with acute lower respiratory infection: Secondary | ICD-10-CM | POA: Diagnosis present

## 2020-01-10 DIAGNOSIS — E785 Hyperlipidemia, unspecified: Secondary | ICD-10-CM | POA: Diagnosis present

## 2020-01-10 DIAGNOSIS — Z9861 Coronary angioplasty status: Secondary | ICD-10-CM

## 2020-01-10 DIAGNOSIS — I2510 Atherosclerotic heart disease of native coronary artery without angina pectoris: Secondary | ICD-10-CM

## 2020-01-10 DIAGNOSIS — I420 Dilated cardiomyopathy: Secondary | ICD-10-CM | POA: Diagnosis present

## 2020-01-10 DIAGNOSIS — I110 Hypertensive heart disease with heart failure: Secondary | ICD-10-CM | POA: Diagnosis present

## 2020-01-10 DIAGNOSIS — Z79899 Other long term (current) drug therapy: Secondary | ICD-10-CM

## 2020-01-10 DIAGNOSIS — Z91041 Radiographic dye allergy status: Secondary | ICD-10-CM

## 2020-01-10 DIAGNOSIS — I248 Other forms of acute ischemic heart disease: Secondary | ICD-10-CM | POA: Diagnosis present

## 2020-01-10 DIAGNOSIS — Z20822 Contact with and (suspected) exposure to covid-19: Secondary | ICD-10-CM | POA: Diagnosis present

## 2020-01-10 DIAGNOSIS — J449 Chronic obstructive pulmonary disease, unspecified: Secondary | ICD-10-CM | POA: Diagnosis present

## 2020-01-10 DIAGNOSIS — B1920 Unspecified viral hepatitis C without hepatic coma: Secondary | ICD-10-CM | POA: Diagnosis present

## 2020-01-10 DIAGNOSIS — I509 Heart failure, unspecified: Secondary | ICD-10-CM

## 2020-01-10 DIAGNOSIS — I5032 Chronic diastolic (congestive) heart failure: Secondary | ICD-10-CM | POA: Diagnosis present

## 2020-01-10 DIAGNOSIS — J441 Chronic obstructive pulmonary disease with (acute) exacerbation: Principal | ICD-10-CM | POA: Diagnosis present

## 2020-01-10 DIAGNOSIS — Z888 Allergy status to other drugs, medicaments and biological substances status: Secondary | ICD-10-CM

## 2020-01-10 DIAGNOSIS — R0603 Acute respiratory distress: Secondary | ICD-10-CM

## 2020-01-10 DIAGNOSIS — I252 Old myocardial infarction: Secondary | ICD-10-CM

## 2020-01-10 DIAGNOSIS — F17210 Nicotine dependence, cigarettes, uncomplicated: Secondary | ICD-10-CM | POA: Diagnosis present

## 2020-01-10 LAB — CBC WITH DIFFERENTIAL/PLATELET
Abs Immature Granulocytes: 0.15 10*3/uL — ABNORMAL HIGH (ref 0.00–0.07)
Basophils Absolute: 0.1 10*3/uL (ref 0.0–0.1)
Basophils Relative: 1 %
Eosinophils Absolute: 0 10*3/uL (ref 0.0–0.5)
Eosinophils Relative: 0 %
HCT: 52.6 % — ABNORMAL HIGH (ref 39.0–52.0)
Hemoglobin: 17.1 g/dL — ABNORMAL HIGH (ref 13.0–17.0)
Immature Granulocytes: 1 %
Lymphocytes Relative: 17 %
Lymphs Abs: 3.6 10*3/uL (ref 0.7–4.0)
MCH: 32 pg (ref 26.0–34.0)
MCHC: 32.5 g/dL (ref 30.0–36.0)
MCV: 98.3 fL (ref 80.0–100.0)
Monocytes Absolute: 2.5 10*3/uL — ABNORMAL HIGH (ref 0.1–1.0)
Monocytes Relative: 12 %
Neutro Abs: 14.2 10*3/uL — ABNORMAL HIGH (ref 1.7–7.7)
Neutrophils Relative %: 69 %
Platelets: 402 10*3/uL — ABNORMAL HIGH (ref 150–400)
RBC: 5.35 MIL/uL (ref 4.22–5.81)
RDW: 13.1 % (ref 11.5–15.5)
WBC: 20.5 10*3/uL — ABNORMAL HIGH (ref 4.0–10.5)
nRBC: 0 % (ref 0.0–0.2)

## 2020-01-10 LAB — BRAIN NATRIURETIC PEPTIDE: B Natriuretic Peptide: 1363.8 pg/mL — ABNORMAL HIGH (ref 0.0–100.0)

## 2020-01-10 LAB — BASIC METABOLIC PANEL
Anion gap: 12 (ref 5–15)
BUN: 18 mg/dL (ref 6–20)
CO2: 31 mmol/L (ref 22–32)
Calcium: 9.6 mg/dL (ref 8.9–10.3)
Chloride: 99 mmol/L (ref 98–111)
Creatinine, Ser: 1.21 mg/dL (ref 0.61–1.24)
GFR calc Af Amer: >60 mL/min (ref >60)
GFR calc non Af Amer: >60 mL/min (ref >60)
Glucose, Bld: 111 mg/dL — ABNORMAL HIGH (ref 70–99)
Potassium: 4.1 mmol/L (ref 3.5–5.1)
Sodium: 142 mmol/L (ref 135–145)

## 2020-01-10 LAB — I-STAT VENOUS BLOOD GAS, ED
Acid-Base Excess: 8 mmol/L — ABNORMAL HIGH (ref 0.0–2.0)
Bicarbonate: 37.4 mmol/L — ABNORMAL HIGH (ref 20.0–28.0)
Calcium, Ion: 1.18 mmol/L (ref 1.15–1.40)
HCT: 50 % (ref 39.0–52.0)
Hemoglobin: 17 g/dL (ref 13.0–17.0)
O2 Saturation: 93 %
Potassium: 3.6 mmol/L (ref 3.5–5.1)
Sodium: 143 mmol/L (ref 135–145)
TCO2: 40 mmol/L — ABNORMAL HIGH (ref 22–32)
pCO2, Ven: 71.7 mmHg (ref 44.0–60.0)
pH, Ven: 7.325 (ref 7.250–7.430)
pO2, Ven: 77 mmHg — ABNORMAL HIGH (ref 32.0–45.0)

## 2020-01-10 LAB — TROPONIN I (HIGH SENSITIVITY)
Troponin I (High Sensitivity): 50 ng/L — ABNORMAL HIGH (ref <18)
Troponin I (High Sensitivity): 47 ng/L — ABNORMAL HIGH (ref <18)

## 2020-01-10 MED ORDER — ONDANSETRON HCL 4 MG PO TABS: 4.0000 mg | ORAL_TABLET | Freq: Four times a day (QID) | ORAL | Status: DC | PRN

## 2020-01-10 MED ORDER — IPRATROPIUM-ALBUTEROL 0.5-2.5 (3) MG/3ML IN SOLN
3.0000 mL | Freq: Once | RESPIRATORY_TRACT | Status: AC
  Administered 2020-01-10: 3 mL via RESPIRATORY_TRACT
  Filled 2020-01-10: qty 3

## 2020-01-10 MED ORDER — SODIUM CHLORIDE 0.9 % IV SOLN
1.0000 g | Freq: Once | INTRAVENOUS | Status: AC
  Administered 2020-01-10: 1 g via INTRAVENOUS
  Filled 2020-01-10: qty 10

## 2020-01-10 MED ORDER — ACETAMINOPHEN 650 MG RE SUPP: 650.0000 mg | Freq: Four times a day (QID) | RECTAL | Status: DC | PRN

## 2020-01-10 MED ORDER — METHYLPREDNISOLONE SODIUM SUCC 125 MG IJ SOLR
125.0000 mg | Freq: Once | INTRAMUSCULAR | Status: AC
  Administered 2020-01-10: 125 mg via INTRAVENOUS
  Filled 2020-01-10: qty 2

## 2020-01-10 MED ORDER — ONDANSETRON HCL 4 MG/2ML IJ SOLN: 4.0000 mg | Freq: Four times a day (QID) | INTRAMUSCULAR | Status: DC | PRN

## 2020-01-10 MED ORDER — AZITHROMYCIN 250 MG PO TABS
500.0000 mg | ORAL_TABLET | Freq: Once | ORAL | Status: AC
  Administered 2020-01-10: 500 mg via ORAL
  Filled 2020-01-10: qty 2

## 2020-01-10 MED ORDER — SODIUM CHLORIDE 0.9 % IV SOLN: 1.0000 g | INTRAVENOUS | Status: DC

## 2020-01-10 MED ORDER — ACETAMINOPHEN 325 MG PO TABS: 650.0000 mg | ORAL_TABLET | Freq: Four times a day (QID) | ORAL | Status: DC | PRN

## 2020-01-10 MED ORDER — SODIUM CHLORIDE 0.9 % IV SOLN: 500.0000 mg | INTRAVENOUS | Status: DC

## 2020-01-10 MED ORDER — ENOXAPARIN SODIUM 40 MG/0.4ML ~~LOC~~ SOLN
40.0000 mg | Freq: Every day | SUBCUTANEOUS | Status: DC
  Administered 2020-01-11: 40 mg via SUBCUTANEOUS
  Filled 2020-01-10: qty 0.4

## 2020-01-10 NOTE — ED Notes (Signed)
Critical result personally delivered to Dr. Denton Lank

## 2020-01-10 NOTE — H&P (Signed)
History and Physical   Francisco [name] DJS:[id number] DOB: 03-30-1961 DOA: 01/10/2020  Referring MD/NP/PA: Dr. Denton Lank  PCP: Patient, No Pcp Per   Outpatient Specialists: None  Patient coming from: Home where he lives in a motel  Chief Complaint: Shortness of breath  HPI: Francisco [name] is a 59 y.o. male with medical history significant of poorly controlled CHF and COPD (not on home oxygen), daily smoker, w/ hx of CAD with PCI x 2, and reported MI, presenting to the ED with dyspnea on exertion. He's been seen several times in the past in the ED for similar complaints, including on 01/04/20, where he has been symptomatically treated for COPD and CHF and strongly advised to establish care with cardiology. Unfortunately he has not done so.  He presented to the ER yesterday with significant shortness of breath cough as well as progressive lower extremity edema especially when his stands on his feet. He has had a productive cough for years, worse over the past fwew days. His sputum has changed from clear to greenish yellow over the past few days as well.  Patient was evaluated at the time.  He has been taking Lasix on and off on using nebulizer machine from time to time.  He continues to smoke and has reported recent use of methamphetamines about 4 days ago as well as IV heroin about 2 weeks ago.  Patient was admitted last night to continue evaluation as a case of possible COPD exacerbation versus CHF.  He refused and left AGAINST MEDICAL ADVICE.  Patient returned today due to worsening symptoms.  His x-ray showed bilateral lower extremity infiltrates versus atelectasis.  He still has some productive cough.  COVID-19 screen is pending.  Patient has been admitted with acute on chronic respiratory failure.  .  ED Course: pls see H&P from yesterday  Review of Systems: As per HPI otherwise 10 point review of systems negative.    Past Medical History:  Diagnosis Date   COPD (chronic obstructive pulmonary  disease) (HCC)    Coronary artery disease    a. s/p prior PCI.   Hepatitis-C    Hypertension    IV drug abuse (HCC)    a. 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   Medically noncompliant    MI (myocardial infarction) (HCC)    Tobacco abuse     Past Surgical History:  Procedure Laterality Date   BACK SURGERY     NECK SURGERY     RIGHT/LEFT HEART CATH AND CORONARY ANGIOGRAPHY N/A 04/30/2019   Procedure: RIGHT/LEFT HEART CATH AND CORONARY ANGIOGRAPHY;  Surgeon: Marykay Lex, MD;  Location: [facility] INVASIVE CV LAB;  Service: Cardiovascular;  Laterality: N/A;   stints       reports that he has been smoking cigarettes. He has been smoking about 0.75 packs per day. He has never used smokeless tobacco. He reports current drug use. Drug: Methamphetamines. He reports that he does not drink alcohol.  Allergies  Allergen Reactions   Ivp Dye [Iodinated Diagnostic Agents] Other (See Comments)    seizures   Tramadol Other (See Comments)    seizures    Family History  Problem Relation Age of Onset   Rheum arthritis Mother      Prior to Admission medications   Medication Sig Start Date End Date Taking? Authorizing Provider  albuterol (VENTOLIN HFA) 108 (90 Base) MCG/ACT inhaler Inhale 1-2 puffs into the lungs every 6 (six) hours as needed for wheezing or shortness of breath. 01/04/20 02/03/20 Yes Tilden Fossa,  MD  aspirin EC 81 MG tablet Take 1 tablet (81 mg total) by mouth daily. 01/04/20  Yes Tilden Fossa, MD  atorvastatin (LIPITOR) 80 MG tablet Take 1 tablet (80 mg total) by mouth daily at 6 PM. 01/04/20 02/03/20 Yes Tilden Fossa, MD  carvedilol (COREG) 25 MG tablet Take 1 tablet (25 mg total) by mouth 2 (two) times daily with a meal. 01/04/20 02/03/20 Yes Tilden Fossa, MD  furosemide (LASIX) 40 MG tablet Take 1 tablet (40 mg total) by mouth daily. 01/04/20 02/03/20 Yes Tilden Fossa, MD  ibuprofen (ADVIL) 200 MG tablet Take 400 mg by mouth every 6 (six)  hours as needed for moderate pain.   Yes [provider]  ipratropium-albuterol (DUONEB) 0.5-2.5 (3) MG/3ML SOLN Take 3 mLs by nebulization 3 (three) times daily. 01/04/20 02/03/20  Tilden Fossa, MD  isosorbide mononitrate (IMDUR) 30 MG 24 hr tablet Take 1 tablet (30 mg total) by mouth daily. Patient not taking: Reported on 01/10/2020 08/21/19 09/20/19  Kendell Bane, MD  lisinopril (ZESTRIL) 20 MG tablet Take 1 tablet (20 mg total) by mouth daily. 08/20/19 01/09/20  ShahmehdiGemma Payor, MD  methylPREDNISolone (MEDROL) 4 MG tablet Take 1 tablet (4 mg total) by mouth daily. Please follow instruction for Dosepak Patient not taking: Reported on 01/09/2020 08/20/19   Kendell Bane, MD  mometasone-formoterol (DULERA) 200-5 MCG/ACT AERO Inhale 2 puffs into the lungs 2 (two) times daily. 01/04/20 02/03/20  Tilden Fossa, MD  nitroGLYCERIN (NITROSTAT) 0.4 MG SL tablet Place 1 tablet (0.4 mg total) under the tongue every 5 (five) minutes x 3 doses as needed for chest pain. 08/20/19   Shahmehdi, Gemma Payor, MD  predniSONE (DELTASONE) 10 MG tablet Take 4 tablets (40 mg total) by mouth daily. Patient not taking: Reported on 01/10/2020 01/04/20   Tilden Fossa, MD    Physical Exam: Vitals:   01/10/20 2030 01/10/20 2045 01/10/20 2100 01/10/20 2130  BP: (!) 196/138 (!) 183/130 (!) 191/139 (!) 195/137  Pulse: (!) 116 (!) 115 (!) 114 (!) 115  Resp: (!) 26 (!) 28 (!) 25 (!) 25  Temp:      TempSrc:      SpO2: 94% 97% 98% 98%  Weight:      Height:          Constitutional: NAD, calm, comfortable Vitals:   01/10/20 2030 01/10/20 2045 01/10/20 2100 01/10/20 2130  BP: (!) 196/138 (!) 183/130 (!) 191/139 (!) 195/137  Pulse: (!) 116 (!) 115 (!) 114 (!) 115  Resp: (!) 26 (!) 28 (!) 25 (!) 25  Temp:      TempSrc:      SpO2: 94% 97% 98% 98%  Weight:      Height:       Eyes: PERRL, lids and conjunctivae normal ENMT: Mucous membranes are moist. Posterior pharynx clear of any exudate or lesions.Normal  dentition.  Neck: normal, supple, no masses, no thyromegaly Respiratory: Decreased air entry bilaterally with coarse breath sounds.  Normal respiratory effort. No accessory muscle use.  Cardiovascular: Sinus tachycardia no murmurs / rubs / gallops. No extremity edema. 2+ pedal pulses. No carotid bruits.  Abdomen: no tenderness, no masses palpated. No hepatosplenomegaly. Bowel sounds positive.  Musculoskeletal: no clubbing / cyanosis. No joint deformity upper and lower extremities. Good ROM, no contractures. Normal muscle tone.  Skin: no rashes, lesions, ulcers. No induration Neurologic: CN 2-12 grossly intact. Sensation intact, DTR normal. Strength 5/5 in all 4.  Psychiatric: Normal judgment and insight. Alert and oriented x  3. Normal mood.     Labs on Admission: I have personally reviewed following labs and imaging studies  CBC: Recent Labs  Lab 01/09/20 1320 01/09/20 1405 01/10/20 1940 01/10/20 2002  WBC 12.9*  --  20.5*  --   NEUTROABS 10.0*  --  14.2*  --   HGB 15.2 16.3 17.1* 17.0  HCT 47.1 48.0 52.6* 50.0  MCV 96.1  --  98.3  --   PLT 335  --  402*  --    Basic Metabolic Panel: Recent Labs  Lab 01/09/20 1320 01/09/20 1405 01/10/20 1940 01/10/20 2002  NA 141 141 142 143  K 3.3* 3.5 4.1 3.6  CL 99  --  99  --   CO2 30  --  31  --   GLUCOSE 154*  --  111*  --   BUN 17  --  18  --   CREATININE 1.08  --  1.21  --   CALCIUM 9.5  --  9.6  --    GFR: Estimated Creatinine Clearance: 72.1 mL/min (by C-G formula based on SCr of 1.21 mg/dL). Liver Function Tests: No results for input(s): AST, ALT, ALKPHOS, BILITOT, PROT, ALBUMIN in the last 168 hours. No results for input(s): LIPASE, AMYLASE in the last 168 hours. No results for input(s): AMMONIA in the last 168 hours. Coagulation Profile: No results for input(s): INR, PROTIME in the last 168 hours. Cardiac Enzymes: No results for input(s): CKTOTAL, CKMB, CKMBINDEX, TROPONINI in the last 168 hours. BNP (last 3  results) No results for input(s): PROBNP in the last 8760 hours. HbA1C: No results for input(s): HGBA1C in the last 72 hours. CBG: No results for input(s): GLUCAP in the last 168 hours. Lipid Profile: No results for input(s): CHOL, HDL, LDLCALC, TRIG, CHOLHDL, LDLDIRECT in the last 72 hours. Thyroid Function Tests: No results for input(s): TSH, T4TOTAL, FREET4, T3FREE, THYROIDAB in the last 72 hours. Anemia Panel: No results for input(s): VITAMINB12, FOLATE, FERRITIN, TIBC, IRON, RETICCTPCT in the last 72 hours. Urine analysis:    Component Value Date/Time   COLORURINE AMBER (A) 08/18/2019 1320   APPEARANCEUR CLOUDY (A) 08/18/2019 1320   LABSPEC 1.017 08/18/2019 1320   PHURINE 5.0 08/18/2019 1320   GLUCOSEU NEGATIVE 08/18/2019 1320   HGBUR SMALL (A) 08/18/2019 1320   BILIRUBINUR NEGATIVE 08/18/2019 1320   KETONESUR NEGATIVE 08/18/2019 1320   PROTEINUR 100 (A) 08/18/2019 1320   NITRITE NEGATIVE 08/18/2019 1320   LEUKOCYTESUR NEGATIVE 08/18/2019 1320   Sepsis Labs: @LABRCNTIP (procalcitonin:4,lacticidven:4) ) Recent Results (from the past 240 hour(s))  SARS Coronavirus 2 by RT PCR (hospital order, performed in [facility] hospital lab) Nasopharyngeal Nasopharyngeal Swab     Status: None   Collection Time: 01/04/20  2:34 PM   Specimen: Nasopharyngeal Swab  Result Value Ref Range Status   SARS Coronavirus 2 NEGATIVE NEGATIVE Final    Comment: (NOTE) SARS-CoV-2 target nucleic acids are NOT DETECTED.  The SARS-CoV-2 RNA is generally detectable in upper and lower respiratory specimens during the acute phase of infection. The lowest concentration of SARS-CoV-2 viral copies this assay can detect is 250 copies / mL. A negative result does not preclude SARS-CoV-2 infection and should not be used as the sole basis for treatment or other patient management decisions.  A negative result may occur with improper specimen collection / handling, submission of specimen other than  nasopharyngeal swab, presence of viral mutation(s) within the areas targeted by this assay, and inadequate number of viral copies (<250 copies / mL).  A negative result must be combined with clinical observations, patient history, and epidemiological information.  Fact Sheet for Patients:   BoilerBrush.com.cy  Fact Sheet for Healthcare Providers: https://pope.com/  This test is not yet approved or  cleared by the [place] FDA and has been authorized for detection and/or diagnosis of SARS-CoV-2 by FDA under an Emergency Use Authorization (EUA).  This EUA will remain in effect (meaning this test can be used) for the duration of the COVID-19 declaration under Section 564(b)(1) of the Act, 21 U.S.C. section 360bbb-3(b)(1), unless the authorization is terminated or revoked sooner.  Performed at [facility], [address], [place], [place] [address]   SARS Coronavirus 2 by RT PCR (hospital order, performed in [facility] hospital lab) Nasopharyngeal Nasopharyngeal Swab     Status: None   Collection Time: 01/09/20  2:08 PM   Specimen: Nasopharyngeal Swab  Result Value Ref Range Status   SARS Coronavirus 2 NEGATIVE NEGATIVE Final    Comment: (NOTE) SARS-CoV-2 target nucleic acids are NOT DETECTED.  The SARS-CoV-2 RNA is generally detectable in upper and lower respiratory specimens during the acute phase of infection. The lowest concentration of SARS-CoV-2 viral copies this assay can detect is 250 copies / mL. A negative result does not preclude SARS-CoV-2 infection and should not be used as the sole basis for treatment or other patient management decisions.  A negative result may occur with improper specimen collection / handling, submission of specimen other than nasopharyngeal swab, presence of viral mutation(s) within the areas targeted by this assay, and inadequate number of viral copies (<250 copies / mL). A negative  result must be combined with clinical observations, patient history, and epidemiological information.  Fact Sheet for Patients:   BoilerBrush.com.cy  Fact Sheet for Healthcare Providers: https://pope.com/  This test is not yet approved or  cleared by the [place] FDA and has been authorized for detection and/or diagnosis of SARS-CoV-2 by FDA under an Emergency Use Authorization (EUA).  This EUA will remain in effect (meaning this test can be used) for the duration of the COVID-19 declaration under Section 564(b)(1) of the Act, 21 U.S.C. section 360bbb-3(b)(1), unless the authorization is terminated or revoked sooner.  Performed at [facility], [address], [place], [place] [address]      Radiological Exams on Admission: DG Chest Port 1 View  Result Date: 01/10/2020 CLINICAL DATA:  Short of breath.  Chest pain EXAM: PORTABLE CHEST 1 VIEW COMPARISON:  01/09/2020 FINDINGS: Cardiac enlargement without heart failure. Mild bibasilar airspace disease has developed since yesterday. This is more prominent the right than the left. Possible infiltrate or atelectasis. IMPRESSION: Cardiac enlargement without heart failure Mild bibasilar atelectasis/infiltrate right greater than left. Electronically Signed   By: Marlan Palau M.D.   On: 01/10/2020 20:17   DG Chest Portable 1 View  Result Date: 01/09/2020 CLINICAL DATA:  Shortness of breath EXAM: PORTABLE CHEST 1 VIEW COMPARISON:  January 03, 2020 FINDINGS: Lungs are clear. Heart size and pulmonary vascularity are normal. No adenopathy. No bone lesions. Rudimentary cervical ribs noted. IMPRESSION: No edema or airspace opacity. Cardiac silhouette within normal limits. Electronically Signed   By: Bretta Bang III M.D.   On: 01/09/2020 13:36      Assessment/Plan Principal Problem:   Acute on chronic respiratory failure with hypoxia and hypercapnia (HCC) Active Problems:    Chronic obstructive pulmonary disease (HCC)   Dilated cardiomyopathy (HCC)   CAD S/P percutaneous coronary angioplasty     #  1 acute on chronic respiratory failure: Secondary to COPD exacerbation and diastolic dysfunction with evidence of early pneumonia.  Patient was admitted yesterday but left AMA.  We will admit and continue with IV Rocephin and Zithromax for community-acquired pneumonia.  Steroids as well as breathing treatments.  Continue with diuretics for his CHF.  #2 COPD exacerbation: Based on patient's recent ABG seems to be a chronic retainer.  Continue duo nebs as well as antibiotics and steroids.  #3 acute exacerbation of CHF: Increase BNP.  Continue Lasix and convert to IV.  #4 polysubstance abuse: Recent use of heroin as well as methamphetamine.  Counseling provided.  Continues to smoke.  We will add nicotine patch.  #5 malignant hypertension: Resume home medication and adjust blood pressure medicines.  Patient does not appear to be compliant.  #6 coronary artery disease: Continue home regimen.   DVT prophylaxis: Lovenox Code Status: Full  Family Communication: No family at bedside Disposition Plan: Home Consults called: None Admission status: Inpatient  Severity of Illness: The appropriate patient status for this patient is INPATIENT. Inpatient status is judged to be reasonable and necessary in order to provide the required intensity of service to ensure the patient's safety. The patient's presenting symptoms, physical exam findings, and initial radiographic and laboratory data in the context of their chronic comorbidities is felt to place them at high risk for further clinical deterioration. Furthermore, it is not anticipated that the patient will be medically stable for discharge from the hospital within 2 midnights of admission. The following factors support the patient status of inpatient.   " The patient's presenting symptoms include shortness of breath and  cough. " The worrisome physical exam findings include coarse breath sound bilaterally. " The initial radiographic and laboratory data are worrisome because of evidence of CHF but also pneumonia on x-ray. " The chronic co-morbidities include CHF and COPD.   * I certify that at the point of admission it is my clinical judgment that the patient will require inpatient hospital care spanning beyond 2 midnights from the point of admission due to high intensity of service, high risk for further deterioration and high frequency of surveillance required.Lonia Blood MD [facility] Pager [phone]  If 7PM-7AM, please contact night-coverage [email or web address] Password [facility]  01/10/2020, 10:12 PM

## 2020-01-10 NOTE — ED Triage Notes (Signed)
Pt arrives via [facility] from home with c/o chest pain/shortness of breath since last night. Has not been able to afford his medications.  86% on room air-placed on non re breather, HR 120's, bp 180/120. Pt was given 4 baby asa and 3 nitro en route, took 1 pta of EMS.

## 2020-01-10 NOTE — ED Provider Notes (Signed)
[facility] EMERGENCY DEPARTMENT Provider Note   CSN: [id number] Arrival date & time: 01/10/20  1916     History Chief Complaint  Patient presents with  . Chest Pain    Francisco [name] is a 59 y.o. male. He has a history of CHF (LVEF 50-55% in 08/2019), COPD, polysubstance use, Hep C, CAD s/p PCI. He presents via EMS today with chest pain and shortness of breath. Shortness of breath has been present for "a while" and is now unable to lay flat or even brush his teeth without becoming dyspneic. States chest pain has been present since last night and is progressively worsening, described as tight with occasion sharp pains that shoot into his back. Denies nausea, vomiting, fevers. Does endorse feeling and bloated in his abdomen and having a chronic cough that is now increasing in frequency and yellow sputum production.   Patient was admitted to the hospital yesterday for COPD/CHF exacerbation management, but left AMA. He was discharged with medication prescriptions, but he cannot afford them.   Hypoxic to 86% on RA, improved with NRB. Tachycardic to 120s, hypertensive to 180s/130s in BLE on arrival. Received ASA 324mg , nitro x3 with EMS.    Chest Pain Pain location:  Substernal area Pain quality: sharp and tightness   Pain radiates to:  Upper back Pain severity:  Moderate Onset quality:  Sudden Timing:  Constant Progression:  Worsening Relieved by:  Nothing Worsened by:  Nothing Associated symptoms: cough and shortness of breath   Associated symptoms: no abdominal pain, no back pain, no fever, no palpitations and no vomiting        Past Medical History:  Diagnosis Date  . COPD (chronic obstructive pulmonary disease) (HCC)   . Coronary artery disease    a. s/p prior PCI.  Marland Kitchen Hepatitis-C   . Hypertension   . IV drug abuse (HCC)    a. 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  . Medically noncompliant   . MI (myocardial infarction) (HCC)   . Tobacco abuse      Patient Active Problem List   Diagnosis Date Noted  . Acute on chronic respiratory failure with hypoxia and hypercapnia (HCC) 01/10/2020  . COPD exacerbation (HCC) 01/09/2020  . Hypotension 08/18/2019  . Acute on chronic combined systolic (congestive) and diastolic (congestive) heart failure (HCC) 08/17/2019  . 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  . Chronic obstructive pulmonary disease (HCC)   . Elevated troponin   . Unstable angina (HCC)   . Dilated cardiomyopathy (HCC)   . CAD S/P percutaneous coronary angioplasty   . ACS (acute coronary syndrome) (HCC) 04/29/2019  . Acute respiratory failure with hypoxia (HCC) 06/17/2018    Past Surgical History:  Procedure Laterality Date  . BACK SURGERY    . NECK SURGERY    . RIGHT/LEFT HEART CATH AND CORONARY ANGIOGRAPHY N/A 04/30/2019   Procedure: RIGHT/LEFT HEART CATH AND CORONARY ANGIOGRAPHY;  Surgeon: Marykay Lex, MD;  Location: [facility] INVASIVE CV LAB;  Service: Cardiovascular;  Laterality: N/A;  . stints         Family History  Problem Relation Age of Onset  . Rheum arthritis Mother     Social History   Tobacco Use  . Smoking status: Current Every Day Smoker    Packs/day: 0.75    Types: Cigarettes  . Smokeless tobacco: Never Used  Vaping Use  . Vaping Use: Never used  Substance Use Topics  . Alcohol use: Never  . Drug use: Yes    Types: Methamphetamines  Comment: heroin    Home Medications Prior to Admission medications   Medication Sig Start Date End Date Taking? Authorizing Provider  albuterol (VENTOLIN HFA) 108 (90 Base) MCG/ACT inhaler Inhale 1-2 puffs into the lungs every 6 (six) hours as needed for wheezing or shortness of breath. 01/04/20 02/03/20 Yes Tilden Fossa, MD  aspirin EC 81 MG tablet Take 1 tablet (81 mg total) by mouth daily. 01/04/20  Yes Tilden Fossa, MD  atorvastatin (LIPITOR) 80 MG tablet Take 1 tablet (80 mg total) by mouth daily at 6 PM. 01/04/20 02/03/20 Yes Tilden Fossa, MD    carvedilol (COREG) 25 MG tablet Take 1 tablet (25 mg total) by mouth 2 (two) times daily with a meal. 01/04/20 02/03/20 Yes Tilden Fossa, MD  furosemide (LASIX) 40 MG tablet Take 1 tablet (40 mg total) by mouth daily. 01/04/20 02/03/20 Yes Tilden Fossa, MD  ibuprofen (ADVIL) 200 MG tablet Take 400 mg by mouth every 6 (six) hours as needed for moderate pain.   Yes [provider]  ipratropium-albuterol (DUONEB) 0.5-2.5 (3) MG/3ML SOLN Take 3 mLs by nebulization 3 (three) times daily. 01/04/20 02/03/20  Tilden Fossa, MD  isosorbide mononitrate (IMDUR) 30 MG 24 hr tablet Take 1 tablet (30 mg total) by mouth daily. Patient not taking: Reported on 01/10/2020 08/21/19 09/20/19  Kendell Bane, MD  lisinopril (ZESTRIL) 20 MG tablet Take 1 tablet (20 mg total) by mouth daily. 08/20/19 01/09/20  ShahmehdiGemma Payor, MD  methylPREDNISolone (MEDROL) 4 MG tablet Take 1 tablet (4 mg total) by mouth daily. Please follow instruction for Dosepak Patient not taking: Reported on 01/09/2020 08/20/19   Kendell Bane, MD  mometasone-formoterol (DULERA) 200-5 MCG/ACT AERO Inhale 2 puffs into the lungs 2 (two) times daily. 01/04/20 02/03/20  Tilden Fossa, MD  nitroGLYCERIN (NITROSTAT) 0.4 MG SL tablet Place 1 tablet (0.4 mg total) under the tongue every 5 (five) minutes x 3 doses as needed for chest pain. 08/20/19   Shahmehdi, Gemma Payor, MD  predniSONE (DELTASONE) 10 MG tablet Take 4 tablets (40 mg total) by mouth daily. Patient not taking: Reported on 01/10/2020 01/04/20   Tilden Fossa, MD    Allergies    Ivp dye [iodinated diagnostic agents] and Tramadol  Review of Systems   Review of Systems  Constitutional: Negative for chills and fever.  HENT: Negative for ear pain and sore throat.   Eyes: Negative for pain and visual disturbance.  Respiratory: Positive for cough and shortness of breath.   Cardiovascular: Positive for chest pain. Negative for palpitations.  Gastrointestinal: Negative for  abdominal pain and vomiting.  Genitourinary: Negative for dysuria and hematuria.  Musculoskeletal: Negative for arthralgias and back pain.  Skin: Negative for color change and rash.  Neurological: Negative for seizures and syncope.  All other systems reviewed and are negative.   Physical Exam Updated Vital Signs BP (!) 181/124   Pulse (!) 115   Temp 98.8 F (37.1 C) (Oral)   Resp (!) 28   Ht 6' (1.829 m)   Wt 79.4 kg   SpO2 98%   BMI 23.73 kg/m   Physical Exam Vitals and nursing note reviewed.  Constitutional:      General: He is in acute distress.     Appearance: He is well-developed.  HENT:     Head: Normocephalic and atraumatic.  Eyes:     Conjunctiva/sclera: Conjunctivae normal.  Cardiovascular:     Rate and Rhythm: Regular rhythm. Tachycardia present.     Pulses:  Radial pulses are 2+ on the right side and 2+ on the left side.     Heart sounds: No murmur heard.   Pulmonary:     Effort: Tachypnea present. No respiratory distress.     Breath sounds: Wheezing present.     Comments: Poor air movement.  Abdominal:     Palpations: Abdomen is soft.     Tenderness: There is no abdominal tenderness.  Musculoskeletal:     Cervical back: Neck supple.     Right lower leg: No tenderness. No edema.     Left lower leg: No tenderness. No edema.  Skin:    General: Skin is warm and dry.  Neurological:     General: No focal deficit present.     Mental Status: He is alert and oriented to person, place, and time.     ED Results / Procedures / Treatments   Labs (all labs ordered are listed, but only abnormal results are displayed) Labs Reviewed  BASIC METABOLIC PANEL - Abnormal; Notable for the following components:      Result Value   Glucose, Bld 111 (*)    All other components within normal limits  BRAIN NATRIURETIC PEPTIDE - Abnormal; Notable for the following components:   B Natriuretic Peptide 1,363.8 (*)    All other components within normal limits  CBC  WITH DIFFERENTIAL/PLATELET - Abnormal; Notable for the following components:   WBC 20.5 (*)    Hemoglobin 17.1 (*)    HCT 52.6 (*)    Platelets 402 (*)    Neutro Abs 14.2 (*)    Monocytes Absolute 2.5 (*)    Abs Immature Granulocytes 0.15 (*)    All other components within normal limits  I-STAT VENOUS BLOOD GAS, ED - Abnormal; Notable for the following components:   pCO2, Ven 71.7 (*)    pO2, Ven 77.0 (*)    Bicarbonate 37.4 (*)    TCO2 40 (*)    Acid-Base Excess 8.0 (*)    All other components within normal limits  TROPONIN I (HIGH SENSITIVITY) - Abnormal; Notable for the following components:   Troponin I (High Sensitivity) 50 (*)    All other components within normal limits  TROPONIN I (HIGH SENSITIVITY) - Abnormal; Notable for the following components:   Troponin I (High Sensitivity) 47 (*)    All other components within normal limits  RESPIRATORY PANEL BY RT PCR (FLU A&B, COVID)  CBC  CREATININE, SERUM  COMPREHENSIVE METABOLIC PANEL  CBC    EKG EKG Interpretation  Date/Time:  Thursday January 10 2020 19:41:07 EDT Ventricular Rate:  118 PR Interval:  128 QRS Duration: 84 QT Interval:  284 QTC Calculation: 398 R Axis:   55 Text Interpretation: Sinus tachycardia Biatrial enlargement Non-specific ST-t changes No significant change since last tracing Confirmed by Cathren Laine ([id number]) on 01/10/2020 8:16:38 PM   Radiology DG Chest Port 1 View  Result Date: 01/10/2020 CLINICAL DATA:  Short of breath.  Chest pain EXAM: PORTABLE CHEST 1 VIEW COMPARISON:  01/09/2020 FINDINGS: Cardiac enlargement without heart failure. Mild bibasilar airspace disease has developed since yesterday. This is more prominent the right than the left. Possible infiltrate or atelectasis. IMPRESSION: Cardiac enlargement without heart failure Mild bibasilar atelectasis/infiltrate right greater than left. Electronically Signed   By: Marlan Palau M.D.   On: 01/10/2020 20:17   DG Chest Portable 1  View  Result Date: 01/09/2020 CLINICAL DATA:  Shortness of breath EXAM: PORTABLE CHEST 1 VIEW COMPARISON:  January 03, 2020 FINDINGS:  Lungs are clear. Heart size and pulmonary vascularity are normal. No adenopathy. No bone lesions. Rudimentary cervical ribs noted. IMPRESSION: No edema or airspace opacity. Cardiac silhouette within normal limits. Electronically Signed   By: Bretta Bang III M.D.   On: 01/09/2020 13:36    Procedures Procedures (including critical care time)  Medications Ordered in ED Medications  enoxaparin (LOVENOX) injection 40 mg (has no administration in time range)  acetaminophen (TYLENOL) tablet 650 mg (has no administration in time range)    Or  acetaminophen (TYLENOL) suppository 650 mg (has no administration in time range)  ondansetron (ZOFRAN) tablet 4 mg (has no administration in time range)    Or  ondansetron (ZOFRAN) injection 4 mg (has no administration in time range)  cefTRIAXone (ROCEPHIN) 1 g in sodium chloride 0.9 % 100 mL IVPB (has no administration in time range)  azithromycin (ZITHROMAX) 500 mg in sodium chloride 0.9 % 250 mL IVPB (has no administration in time range)  ipratropium-albuterol (DUONEB) 0.5-2.5 (3) MG/3ML nebulizer solution 3 mL (3 mLs Nebulization Given 01/10/20 2006)  ipratropium-albuterol (DUONEB) 0.5-2.5 (3) MG/3ML nebulizer solution 3 mL (3 mLs Nebulization Given 01/10/20 2002)  ipratropium-albuterol (DUONEB) 0.5-2.5 (3) MG/3ML nebulizer solution 3 mL (3 mLs Nebulization Given 01/10/20 1955)  methylPREDNISolone sodium succinate (SOLU-MEDROL) 125 mg/2 mL injection 125 mg (125 mg Intravenous Given 01/10/20 2002)  cefTRIAXone (ROCEPHIN) 1 g in sodium chloride 0.9 % 100 mL IVPB (0 g Intravenous Stopped 01/10/20 2336)  azithromycin (ZITHROMAX) tablet 500 mg (500 mg Oral Given 01/10/20 2135)    ED Course  I have reviewed the triage vital signs and the nursing notes.  Pertinent labs & imaging results that were available during my care of  the patient were reviewed by me and considered in my medical decision making (see chart for details).    MDM Rules/Calculators/A&P                         WBC 20 today compared to 12 yesterday, etiology steriods vs infection. BNP 1363, similar to yesterday. VBG w/ pH 7.3, PCO2 71, indicative of chronic retention.  EKG similar to prior. Trop 50, repeat 47, likely 2/2 demand ischemia.   CXR showed new infiltrates vs atelectasis, R worse than L, compared to yesterday. Given patient's worsening respiratory status, increased cough and sputum, and increased WBC count, will treat for CAP with ceftriaxone and azithromycin.   Patient weaned from NRB to 4L [place] and had improvement in respiratory effort and aeration after duonebs. Likely combined CHF and COPD exacerbation with possible CAP. Admitted for further management.   This patient was seen with Dr. Denton Lank.   Final Clinical Impression(s) / ED Diagnoses Final diagnoses:  COPD exacerbation (HCC)  Acute on chronic congestive heart failure, unspecified heart failure type [facility])  Respiratory distress    Rx / DC Orders ED Discharge Orders    None       Allayne Butcher, MD 01/11/20 0006    Cathren Laine, MD 01/11/20 1546

## 2020-01-11 ENCOUNTER — Other Ambulatory Visit: Payer: Self-pay | Admitting: Student

## 2020-01-11 LAB — CBC
HCT: 49.3 % (ref 39.0–52.0)
Hemoglobin: 15.3 g/dL (ref 13.0–17.0)
MCH: 30.5 pg (ref 26.0–34.0)
MCHC: 31 g/dL (ref 30.0–36.0)
MCV: 98.4 fL (ref 80.0–100.0)
Platelets: 326 10*3/uL (ref 150–400)
RBC: 5.01 MIL/uL (ref 4.22–5.81)
RDW: 13 % (ref 11.5–15.5)
WBC: 13.7 10*3/uL — ABNORMAL HIGH (ref 4.0–10.5)
nRBC: 0 % (ref 0.0–0.2)

## 2020-01-11 LAB — COMPREHENSIVE METABOLIC PANEL
ALT: 51 U/L — ABNORMAL HIGH (ref 0–44)
AST: 22 U/L (ref 15–41)
Albumin: 3.4 g/dL — ABNORMAL LOW (ref 3.5–5.0)
Alkaline Phosphatase: 82 U/L (ref 38–126)
Anion gap: 12 (ref 5–15)
BUN: 16 mg/dL (ref 6–20)
CO2: 35 mmol/L — ABNORMAL HIGH (ref 22–32)
Calcium: 9.3 mg/dL (ref 8.9–10.3)
Chloride: 96 mmol/L — ABNORMAL LOW (ref 98–111)
Creatinine, Ser: 1.18 mg/dL (ref 0.61–1.24)
GFR calc Af Amer: >60 mL/min (ref >60)
GFR calc non Af Amer: >60 mL/min (ref >60)
Glucose, Bld: 179 mg/dL — ABNORMAL HIGH (ref 70–99)
Potassium: 4.5 mmol/L (ref 3.5–5.1)
Sodium: 143 mmol/L (ref 135–145)
Total Bilirubin: 0.6 mg/dL (ref 0.3–1.2)
Total Protein: 6.5 g/dL (ref 6.5–8.1)

## 2020-01-11 LAB — RESPIRATORY PANEL BY RT PCR (FLU A&B, COVID)
Influenza A by PCR: NEGATIVE
Influenza B by PCR: NEGATIVE
SARS Coronavirus 2 by RT PCR: NEGATIVE

## 2020-01-11 MED ORDER — FUROSEMIDE 10 MG/ML IJ SOLN
40.0000 mg | Freq: Every day | INTRAMUSCULAR | Status: DC
  Filled 2020-01-11: qty 4

## 2020-01-11 MED ORDER — IPRATROPIUM-ALBUTEROL 0.5-2.5 (3) MG/3ML IN SOLN: 3.0000 mL | Freq: Three times a day (TID) | RESPIRATORY_TRACT | Status: DC

## 2020-01-11 MED ORDER — SODIUM CHLORIDE 0.9 % IV SOLN
500.0000 mg | INTRAVENOUS | Status: DC
  Filled 2020-01-11: qty 500

## 2020-01-11 MED ORDER — LISINOPRIL 20 MG PO TABS
20.0000 mg | ORAL_TABLET | Freq: Every day | ORAL | Status: DC
  Administered 2020-01-11: 20 mg via ORAL
  Filled 2020-01-11: qty 1

## 2020-01-11 MED ORDER — CARVEDILOL 25 MG PO TABS
25.0000 mg | ORAL_TABLET | Freq: Two times a day (BID) | ORAL | Status: DC
  Administered 2020-01-11: 25 mg via ORAL
  Filled 2020-01-11: qty 1

## 2020-01-11 MED ORDER — AZITHROMYCIN 250 MG PO TABS: 250.0000 mg | ORAL_TABLET | Freq: Every day | ORAL | 0 refills | Status: AC

## 2020-01-11 MED ORDER — METHYLPREDNISOLONE SODIUM SUCC 125 MG IJ SOLR
125.0000 mg | Freq: Four times a day (QID) | INTRAMUSCULAR | Status: DC
  Administered 2020-01-11: 125 mg via INTRAVENOUS
  Filled 2020-01-11: qty 2

## 2020-01-11 MED ORDER — NITROGLYCERIN 0.4 MG SL SUBL: 0.4000 mg | SUBLINGUAL_TABLET | SUBLINGUAL | Status: DC | PRN

## 2020-01-11 MED ORDER — MOMETASONE FURO-FORMOTEROL FUM 200-5 MCG/ACT IN AERO: 2.0000 | INHALATION_SPRAY | Freq: Two times a day (BID) | RESPIRATORY_TRACT | Status: DC

## 2020-01-11 MED ORDER — MOMETASONE FURO-FORMOTEROL FUM 200-5 MCG/ACT IN AERO
2.0000 | INHALATION_SPRAY | Freq: Two times a day (BID) | RESPIRATORY_TRACT | Status: DC
  Administered 2020-01-11: 2 via RESPIRATORY_TRACT
  Filled 2020-01-11: qty 8.8

## 2020-01-11 MED ORDER — ATORVASTATIN CALCIUM 80 MG PO TABS: 80.0000 mg | ORAL_TABLET | Freq: Every day | ORAL | Status: DC

## 2020-01-11 MED ORDER — PREDNISONE 20 MG PO TABS: 40.0000 mg | ORAL_TABLET | Freq: Every day | ORAL | Status: DC

## 2020-01-11 MED ORDER — SODIUM CHLORIDE 0.9 % IV SOLN: 1.0000 g | INTRAVENOUS | Status: DC

## 2020-01-11 MED ORDER — ENOXAPARIN SODIUM 40 MG/0.4ML ~~LOC~~ SOLN: 40.0000 mg | Freq: Every day | SUBCUTANEOUS | Status: DC

## 2020-01-11 MED ORDER — FUROSEMIDE 40 MG PO TABS
40.0000 mg | ORAL_TABLET | Freq: Every day | ORAL | Status: DC
  Administered 2020-01-11: 40 mg via ORAL
  Filled 2020-01-11: qty 1

## 2020-01-11 MED ORDER — IBUPROFEN 200 MG PO TABS: 400.0000 mg | ORAL_TABLET | Freq: Four times a day (QID) | ORAL | Status: DC | PRN

## 2020-01-11 MED ORDER — ASPIRIN EC 81 MG PO TBEC
81.0000 mg | DELAYED_RELEASE_TABLET | Freq: Every day | ORAL | Status: DC
  Administered 2020-01-11: 81 mg via ORAL
  Filled 2020-01-11: qty 1

## 2020-01-11 MED ORDER — PREDNISONE 20 MG PO TABS: 40.0000 mg | ORAL_TABLET | Freq: Every day | ORAL | 0 refills | Status: AC

## 2020-01-11 MED ORDER — IPRATROPIUM-ALBUTEROL 0.5-2.5 (3) MG/3ML IN SOLN
3.0000 mL | Freq: Once | RESPIRATORY_TRACT | Status: AC
  Administered 2020-01-11: 3 mL via RESPIRATORY_TRACT
  Filled 2020-01-11: qty 3

## 2020-01-11 MED ORDER — MOMETASONE FURO-FORMOTEROL FUM 200-5 MCG/ACT IN AERO: 2.0000 | INHALATION_SPRAY | Freq: Two times a day (BID) | RESPIRATORY_TRACT | 0 refills | Status: DC

## 2020-01-11 MED ORDER — IPRATROPIUM-ALBUTEROL 0.5-2.5 (3) MG/3ML IN SOLN: 3.0000 mL | RESPIRATORY_TRACT | Status: DC | PRN

## 2020-01-11 MED ORDER — ALBUTEROL SULFATE HFA 108 (90 BASE) MCG/ACT IN AERS: 1.0000 | INHALATION_SPRAY | Freq: Four times a day (QID) | RESPIRATORY_TRACT | Status: DC | PRN

## 2020-01-11 MED FILL — predniSONE 20 MG TABS: 20 | 4 days supply | Qty: 8 | Fill #0

## 2020-01-11 MED FILL — AZITHROMYCIN 250 MG TABLET: 250 | 4 days supply | Qty: 4 | Fill #0

## 2020-01-11 MED FILL — DULERA 200 MCG/5 MCG INH: 200-5 | 30 days supply | Qty: 13 | Fill #0

## 2020-01-11 NOTE — Progress Notes (Signed)
Pt stated he feels a lot better today, he wants to go home and would be leaving regardless if the doctors would discharge him or not. Pt educated on the risks of leaving AMA and benefits of staying to receive further care. Pt states understanding and signed AMA form. Pt is not in distress, denies SOB and dizziness. Nebulizer delivered to room, advised Pt to keep his MD follow-up visit and stated he would. Pt left with belongings.

## 2020-01-11 NOTE — TOC Initial Note (Signed)
Transition of Care [facility]) - Initial/Assessment Note    Patient Details  Name: Francisco [name] MRN: [id number] Date of Birth: 1960/08/15  Transition of Care [facility]) CM/SW Contact:    Bess Kinds, RN Phone Number:  [phone] 01/11/2020, 11:56 AM  Clinical Narrative:                  Notified by nursing of patient request for medication assistance for home medications. During previous hospitalization this year, patient received [facility] 08/20/2019. [name] are only eligible for this benefit once a year. The plan was for patient to follow up with [facility] 08/29/2019 9am, and refills would be filled at [facility] pharmacy.   Spoke with patient on his hospital room phone. Patient stated that he kept one follow up appointment which was with his cardiologist. He did not keep his [facility] appointment d/t his working out of town. He states that at this time he no longer able to continue to work.   Discussed with patient the importance of following up with scheduled appointments and to use the [facility] pharmacy for refills d/t their patient assistance programs for medications. Advised patient that NCM will call to schedule a hospital follow up appointment, but patient needs to be sure to keep this appointment or he could be deemed ineligible. Patient verbalized understanding of importance of follow up and expressed appreciation for NCM to schedule appointment. [facility] scheduled hospital follow up appointment for Thursday, 01/17/2020 at 4:10pm.   Patient currently resides at [facility] ([facility] [address], [place], [place] [address]. Room 454. [phone]. Patient states that he has a roommate.   He reports no DME, but stated that he did have a nebulizer until about 9 months ago when someone broke into his room and took, not the meds just the machine. Discussed charity process through [employer] - patient agreeable. DME order placed and referral sent to [employer] who will deliver to the room.   Patient uses the  bus system for transportation. Will provide bus pass at discharge. [facility] to [facility] to [facility] discharge prescriptions. Please not [facility] is not open on weekends.   TOC following for transition needs.   Expected Discharge Plan: Home/Self Care Barriers to Discharge: Continued Medical Work up   Patient Goals and CMS Choice   CMS Medicare.gov Compare Post Acute Care list provided to:: Patient Choice offered to / list presented to : Patient  Expected Discharge Plan and Services Expected Discharge Plan: Home/Self Care In-house Referral: Financial Counselor Discharge Planning Services: CM Consult Post Acute Care Choice: Durable Medical Equipment Living arrangements for the past 2 months: Hotel/Motel                 DME Arranged: Nebulizer machine DME Agency: [employer] Date DME Agency Contacted: 01/11/20 Time DME Agency Contacted: 1155 Representative spoke with at DME Agency: Ian Malkin HH Arranged: NA HH Agency: NA        Prior Living Arrangements/Services Living arrangements for the past 2 months: Hotel/Motel Lives with:: Self, Roommate Patient language and need for interpreter reviewed:: Yes        Need for Family Participation in Patient Care: No (Comment)     Criminal Activity/Legal Involvement Pertinent to Current Situation/Hospitalization: No - Comment as needed  Activities of Daily Living      Permission Sought/Granted                  Emotional Assessment Appearance:: Appears stated age Attitude/Demeanor/Rapport: Engaged Affect (typically observed): Accepting Orientation: :  Oriented to Self, Oriented to  Time, Oriented to Place, Oriented to Situation Alcohol / Substance Use: Illicit Drugs, Tobacco Use Psych Involvement: No (comment)  Admission diagnosis:  Respiratory distress [R06.03] COPD exacerbation (HCC) [J44.1] Acute on chronic respiratory failure with hypoxia and hypercapnia (HCC) [J96.21, J96.22] Acute on chronic congestive heart failure,  unspecified heart failure type [facility]) [I50.9] Patient Active Problem List   Diagnosis Date Noted  . Acute on chronic respiratory failure with hypoxia and hypercapnia (HCC) 01/10/2020  . COPD exacerbation (HCC) 01/09/2020  . Hypotension 08/18/2019  . Acute on chronic combined systolic (congestive) and diastolic (congestive) heart failure (HCC) 08/17/2019  . 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  . Chronic obstructive pulmonary disease (HCC)   . Elevated troponin   . Unstable angina (HCC)   . Dilated cardiomyopathy (HCC)   . CAD S/P percutaneous coronary angioplasty   . ACS (acute coronary syndrome) (HCC) 04/29/2019  . Acute respiratory failure with hypoxia (HCC) 06/17/2018   PCP:  Patient, No Pcp Per Pharmacy:   [id number] [place] Otto, [place] - [address] AT [facility] [address] [place] [address] Phone: [phone] Fax: [phone]  [facility] [place], [place] - [address] [place] [address] Phone: [phone] Fax: [phone]     Social Determinants of Health (SDOH) Interventions    Readmission Risk Interventions Readmission Risk Prevention Plan 08/20/2019  Transportation Screening Complete  PCP or Specialist Appt within 3-5 Days Complete  HRI or Home Care Consult Complete  Social Work Consult for Recovery Care Planning/Counseling Complete  Palliative Care Screening Not Applicable  Medication Review [employer]) Complete

## 2020-01-11 NOTE — Progress Notes (Signed)
Subjective: Patient reports that he is feeling a little better today, no acute events overnight.  He reports some improvement in his breathing however still is having some shortness of breath.  Denies any current chest pain.  Reports that he is hungry.  He is agreeable for further treatment, discussed that we are treating for a COPD exacerbation and possible pneumonia.  He stated he does not have a PCP and that when he left he found out [facility] inhaler was very expensive.  Discussed that we will consult transitions of care to assist with this.  Objective:  Vital signs in last 24 hours: Vitals:   01/11/20 0130 01/11/20 0200 01/11/20 0230 01/11/20 0315  BP: (!) 174/100 (!) 154/91 (!) 152/102 (!) 145/99  Pulse: (!) 111 (!) 105 (!) 103 92  Resp: (!) 23 20 (!) 21   Temp:    98.4 F (36.9 C)  TempSrc:    Oral  SpO2: 97% 96% 97% 100%  Weight:      Height:       General: Middle-aged male, no acute distress, sitting up in bed Cardiac: Regular rate and rhythm, no murmurs rubs or gallops, no obvious JVD Pulmonary: Diffuse expiratory wheezing, normal work of breathing, on 4 L nasal cannula Abdomen: Soft, nontender, nondistended, normoactive bowel sounds Extremities: 1+ bilateral lower extremity swelling up to the knees  Assessment/Plan:  Principal Problem:   Acute on chronic respiratory failure with hypoxia and hypercapnia (HCC) Active Problems:   Chronic obstructive pulmonary disease (HCC)   Dilated cardiomyopathy (HCC)   CAD S/P percutaneous coronary angioplasty  This is a 59 year old person who uses drugs with a history of heart failure(EF in 5/21 was 50-55%), HTN, COPD, daily smoker, CAD status post PCI x2, and reported MI who presented on 9/23 for worsening shortness of breath with exertion.  Initially presented on 9/22 and was noted to have shortness of breath and lower extremity swelling, as well as productive cough that has been worsening.  Of note he noted an episode of overdosing on  fentanyl approximately 2 weeks ago requiring Narcan.  Also endorses methamphetamine use approximately 3 days ago.  He was treated with DuoNebs and Solu-Medrol in the ED, as well as home Lasix, had plan to treat for COPD exacerbation however patient left AGAINST MEDICAL ADVICE.  Return to the ER on 9/23 and noted to have chest pain, shortness of breath and was noted to have hypoxia down to 86% on room air and improved with nonrebreather.  As well as hypertension showing up to 180s/130s.  Patient received aspirin 324 and nitro x3.  Chest x-ray showed bilateral infiltrates versus atelectasis.  Noted to have acute on chronic respiratory failure, started treatment for CAP and COPD exacerbation.  Transferred to IMTS on 9/24.  Acute on chronic respiratory failure: Pneumonia versus COPD exacerbation versus heart failure exacerbation: Patient presenting with worsening shortness of breath, and exertion, mild hypokalemia and wheezing on exam.  Chest x-ray showed cardiac enlargement, mild bibasilar atelectasis/infiltrate right greater than left pulmonary edema.  Covid test negative.  ABG done yesterday showed pH 7.35, PCO2 71, PO2 77, bicarb 37.4.  WBC elevated at 20.5, today it is improved to 13.7.  He has been afebrile during admission BNP elevated at 1363.  Patient given a dose of Solu-Medrol and DuoNebs.  As well as a dose of azithromycin and ceftriaxone for possible CAP.  Today patient reports that he is having some improvement in his breathing however still feels short of breath, denies any  chest pain at this time.  He is currently on 4 L nasal cannula.  Does not appear significantly volume overloaded on exam.  -Continue azithromycin and ceftriaxone, day 2 -Monitor CBC and BMP -Continue duo nebs as needed -Continue prednisone daily x4 days -Resume home Dulera 2 puffs twice daily -Continue supplemental oxygen as needed, wean as tolerated -Consult TOC for PCP needs and cost of Dulera  Troponinemia: Troponins  elevated to 50, repeat was 47.  EKG showed no acute changes.  Likely secondary to demand ischemia.  We will continue to monitor for chest pain and need for repeat EKG and troponins.  History of heart failure with preserved EF: Patient is currently prescribed Coreg 25 mg twice daily, Lasix 40 mg daily, lisinopril 20 mg daily, and Imdur 30 mg daily.  He reports intermittently taking the Coreg, atorvastatin, lisinopril, prednisone, and albuterol.  Currently does not appear significantly volume overloaded on exam.  Will resume his home Lasix orally and home lisinopril.  -Resume home lisinopril 20 mg daily -Resume home Lasix 40 mg daily -We will need to follow-up with PCP and outpatient cardiology  Hyperlipidemia: On Lipitor 80 mg daily at home, patient reports that he intermittently takes this.    -Resume home Lipitor.  Hypertension: Blood pressures have been elevated during admission, up to 196/138, has been improving around 145/99.  He is on Coreg 25 mg twice daily, and lisinopril 20 mg daily at home.  Kidney function is stable.  -Resume home lisinopril 20 mg daily -Monitor blood pressures -Daily BMP  Polysubstance use: Patient endorsed IV heroin, IV methamphetamine, and fentanyl.  Reportedly overdosed approximately 2 weeks ago and required Narcan.  Patient developing multiple complications from his polysubstance use, encouraged cessation.  Medication noncompliance: He reports intermittently taking the Coreg, atorvastatin, lisinopril, prednisone, and albuterol.  He was noted to have been prescribed Dulera, Imdur, Lasix, and aspirin.  Patient will need to establish care with PCP and cardiology and will need further discussion about medication adherence.  -TOC consult  Prior to Admission Living Arrangement: Motel Anticipated Discharge Location: Motel Barriers to Discharge: Continued medical management Dispo: Anticipated discharge in approximately 3-4 day(s).   Claudean Severance,  MD 01/11/2020, 7:02 AM Pager: [phone] After 5pm on weekdays and 1pm on weekends: On Call pager [phone]

## 2020-01-11 NOTE — Progress Notes (Signed)
Patient leaving hospital AMA. Attempted to persuade him to stay, but he refuses. Prescriptions for COPD exacerbation sent to [facility]. Has appointment with [facility] on 9/30.

## 2020-01-11 NOTE — Plan of Care (Signed)

## 2020-01-11 NOTE — Discharge Summary (Addendum)
PATIENT LEFT AMA Attempted to persuade patient to stay, but he felt better after starting Tx for COPD exacerbation and insists on leaving. Send prescriptions to [facility], he has a PCP f/u there on 9/30.   Name: Francisco [name] MRN: [id number] DOB: 29-Dec-1960 59 y.o. PCP: Patient, No Pcp Per Seeing Georgian Co, PA-C at [facility] on 01/17/20  Date of Admission: 01/10/2020  7:16 PM Date of Discharge: 01/11/2020 Attending Physician: Jessy Oto, MD PhD  Discharge Diagnosis: 1. COPD exacerbation 2. Chronic HFpEF 3. Polysubstance use disorder 4. Medication non-compliance  Discharge Medications: Nebulizer Duo-neb 0.5-2.5 nebulizer solution Albuterol inhaler Dulera 200-31mcg inhaler Azithromycin 250mg  for 4 more days Prednisone 40mg  for 4 more days Coreg 25mg  BID Lasix 40mg  daily Lisinopril 20mg  daily   Disposition and follow-up:   Francisco [name] left AMA from [facility] in [place] condition.   At the hospital follow up visit please address:  1.  Follow up     A. COPD exacerbation - prescribed Dulera, nebulizer, prednisone, azithromycin. he has albuterol inhaler. Consider further Rx for control and help with obtaining meds     B. Chronic HFpEF - has lisinopril, Lasix, Coreg. Please refill medications and assist with affordability     C. Medication non-compliance - most of his healthcare obtained at ED, attempted to refer to PCP this admission but left prior to this  2.  Labs / imaging needed at time of follow-up: PFT  3.  Pending labs/ test needing follow-up: none  Follow-up Appointments:   Seeing , PA-C at [facility] on 01/17/20   Hospital Course by problem list:  Acute on chronic respiratory failure COPD exacerbation  Patient presenting with worsening shortness of breath, and exertion, mild hypokalemia and wheezing on exam. Was seen for this 2 days ago and left AMA after breathing treatments,  came back for worsening SOB.  Chest x-ray showed cardiac enlargement, mild bibasilar atelectasis/infiltrate right greater than left pulmonary edema.  Covid test negative.  ABG with pH 7.35, PCO2 71, bicarb 37.4. BNP elevated at 1363 but no evidence of volume overload. Ambulatory pulse ox down to 90% on room air. Started on DuoNebs, solu-medrol, azithromycin, ceftriaxone with improvement in symptoms. Now wanting to leave AMA but request medications. Prescribed prednisone, azithromycin, nebulizer with duo-neb solution, Dulera.   Troponinemia Troponins elevated to 50, repeat was 47.  EKG showed no acute changes.  Likely secondary to demand ischemia.  No CP.  History of heart failure with preserved EF Patient has his prescriptions of Coreg 25 mg twice daily, Lasix 40 mg daily, lisinopril 20 mg daily from ED physician. BNP slowly rising over past months, 1363 this presentation. However, no evidence of volume overload. Resumed home Lasix this admission. F/u with PCP for refills.  Polysubstance use: Patient endorsed IV heroin, IV methamphetamine, and fentanyl.  Reportedly overdosed approximately 2 weeks ago and required Narcan.  Encouraged cessation.  Discharge Vitals:   BP (!) 159/95    Pulse 98    Temp 97.8 F (36.6 C) (Oral)    Resp (!) 21    Ht 6' (1.829 m)    Wt 79.4 kg    SpO2 93%    BMI 23.73 kg/m   Pertinent Labs, Studies, and Procedures:  DG Chest Port 1 View  Result Date: 01/10/2020 CLINICAL DATA:  Short of breath.  Chest pain EXAM: PORTABLE CHEST 1 VIEW COMPARISON:  01/09/2020 FINDINGS: Cardiac enlargement without heart failure. Mild bibasilar airspace disease  has developed since yesterday. This is more prominent the right than the left. Possible infiltrate or atelectasis. IMPRESSION: Cardiac enlargement without heart failure Mild bibasilar atelectasis/infiltrate right greater than left. Electronically Signed   By: Marlan Palau M.D.   On: 01/10/2020 20:17   Respiratory panel:  negative Troponin: 50 > 47 VBG: pH 7.325, pCO2 71.7 BNP 1363.8  Discharge Instructions: Instructed to pick up prescriptions from [facility] and f/u with his PCP there on 9/30.  Signed: Remo Lipps, MD 01/11/2020, 3:17 PM   Pager: [phone]

## 2020-01-11 NOTE — Plan of Care (Signed)

## 2020-01-11 NOTE — Discharge Summary (Signed)
PATIENT LEFT AMA Attempted to persuade patient to stay, but he felt better after starting Tx for COPD exacerbation and insists on leaving. Attempted to provide prescriptions and referral to [facility] for PCP needs, but he left before this could be arranged.   Name: Francisco [name] MRN: [id number] DOB: 04-06-1961 59 y.o. PCP: Patient, No Pcp Per Seeing Georgian Co, PA-C at [facility] on 01/17/20  Date of Admission: 01/09/20 Date of Discharge: 01/09/20 Attending Physician: Jessy Oto, MD PhD Discharge Diagnosis: 1. COPD exacerbation 2. Polysubstance Use Disorder 3. Chronic HFpEF  Discharge Medications: Albuterol inhaler Coreg 25mg  BID Lasix 40mg  daily Lisinopril 20mg  daily  Disposition and follow-up:   Francisco [name] left AMA from [facility] in [place] condition.  At the hospital follow up visit please address:  1.  Follow up     A. COPD exacerbation - has albuterol inhaler only. Consider further Rx such as Dulera for control, and help with obtaining meds     B. Chronic HFpEF - has lisinopril, Lasix, Coreg. Please refill medications and assist with obtaining meds     C. Medication non-compliance - most of his healthcare obtained at ED, attempted to refer to PCP this admission but left prior to this  2.  Labs / imaging needed at time of follow-up: PFT  3.  Pending labs/ test needing follow-up: none  Follow-up Appointments:  Seeing , PA-C at [facility] on 01/17/20  Hospital Course by problem list: 1. Acute on chronic respiratory failure COPD exacerbation  Patient presenting with worsening shortness of breath on exertion. Was seen for this at the ED 1 week ago, and also for not having his home medications. Since then, he found his meds at home.Chest x-ray with No edema or airspace opacity. Cardiac silhouette within normal limits. Covid test negative. ABG with pH 7.453, pCO2 50.2. Started on DuoNebs,  solu-medrol with improvement in symptoms. Now wanting to leave AMA since breathing has improved, asked to wait so that we can prescribe medications but he refuses.  History of heart failure with preserved EF Patient has his prescriptions of Coreg 25 mg twice daily, Lasix 40 mg daily, lisinopril 20 mg daily from ED physician. BNP slowly rising over past months, 1301 this presentation. However, no evidence of volume overload. Left AMA with previously mentioned home meds.  Polysubstance use: Patient endorsed IV heroin, IV methamphetamine, and fentanyl. Reportedly overdosed approximately 2 weeks ago and required Narcan. Encouraged cessation.  Discharge Vitals:   BP (!) 160/102 (BP Location: Right Arm)   Pulse 95   Temp 97.7 F (36.5 C) (Oral)   Resp 20   SpO2 95%   Pertinent Labs, Studies, and Procedures:  DG Chest Portable 1 View  Result Date: 01/09/2020 CLINICAL DATA:  Shortness of breath EXAM: PORTABLE CHEST 1 VIEW COMPARISON:  January 03, 2020 FINDINGS: Lungs are clear. Heart size and pulmonary vascularity are normal. No adenopathy. No bone lesions. Rudimentary cervical ribs noted. IMPRESSION: No edema or airspace opacity. Cardiac silhouette within normal limits. Electronically Signed   By: 01/19/20 III M.D.   On: 01/09/2020 13:36   Troponin: 31 VBG: pH 7.453, pCO2 50.2 BicarbL 30 COVID: negative  Discharge Instructions: Patient left AMA before any instructions could be provided.  Signed: January 05, 2020, MD 01/11/2020, 3:40 PM   Pager: [phone]

## 2020-01-16 NOTE — Progress Notes (Signed)
Patient ID: Francisco [name], male   DOB: [birth year], 59 y.o.   MRN: [id number]     Francisco [name], is a 59 y.o. male  ZHG:[id number]  [id number]:[id number]  DOB - Apr 22, 1960  Subjective:  Chief Complaint and HPI: Francisco [name] is a 59 y.o. male here today to establish care and for a follow up visit After hospitalization 9/23-9/24/2021 who left AMA. A1C=5.5 5 months ago(I noticed some hyperglycemia in his hospital labs).  He is not feeling much better but left AMA.  He admits to methamphetamine use yesterday.  Unsure if it has other substances in it too.  Last used heroin 2 weeks ago.  Denies SI/HI/no plan or intent.  jsut "sometimes wish I wasn't here."  From discharge summary: Discharge Diagnosis: 1. COPD exacerbation 2. Chronic HFpEF 3. Polysubstance use disorder 4. Medication non-compliance  Discharge Medications: Nebulizer Duo-neb 0.5-2.5 nebulizer solution Albuterol inhaler Dulera 200-73mcg inhaler Azithromycin 250mg  for 4 more days Prednisone 40mg  for 4 more days Coreg 25mg  BID Lasix 40mg  daily Lisinopril 20mg  daily   Disposition and follow-up:   [name] left AMA from [facility] in [place] condition.   At the hospital follow up visit please address:  1.  Follow up     A. COPD exacerbation - prescribed Dulera, nebulizer, prednisone, azithromycin. he has albuterol inhaler. Consider further Rx for control and help with obtaining meds     B. Chronic HFpEF - has lisinopril, Lasix, Coreg. Please refill medications and assist with affordability     C. Medication non-compliance - most of his healthcare obtained at ED, attempted to refer to PCP this admission but left prior to this  2.  Labs / imaging needed at time of follow-up: PFT  3.  Pending labs/ test needing follow-up: none     Hospital Course by problem list:  Acute on chronic respiratory failure COPD exacerbation  Patient presenting with worsening shortness of breath, and exertion, mild  hypokalemia and wheezing on exam. Was seen for this 2 days ago and left AMA after breathing treatments, came back for worsening SOB. Chest x-ray showed cardiac enlargement, mild bibasilar atelectasis/infiltrate right greater than left pulmonary edema. Covid test negative. ABG with pH 7.35, PCO2 71, bicarb 37.4. BNP elevated at 1363 but no evidence of volume overload. Ambulatory pulse ox down to 90% on room air. Started on DuoNebs, solu-medrol, azithromycin, ceftriaxone with improvement in symptoms. Now wanting to leave AMA but request medications. Prescribed prednisone, azithromycin, nebulizer with duo-neb solution, Dulera.   Troponinemia Troponins elevated to 50, repeat was 47. EKG showed no acute changes. Likely secondary to demand ischemia. No CP.  History of heart failure with preserved EF Patient has his prescriptions of Coreg 25 mg twice daily, Lasix 40 mg daily, lisinopril 20 mg daily from ED physician. BNP slowly rising over past months, 1363 this presentation. However, no evidence of volume overload. Resumed home Lasix this admission. F/u with PCP for refills.  Polysubstance use: Patient endorsed IV heroin, IV methamphetamine, and fentanyl. Reportedly overdosed approximately 2 weeks ago and required Narcan. Encouraged cessation.  ED/Hospital notes reviewed.    ROS:   Constitutional:  No f/c, No night sweats, No unexplained weight loss. EENT:  No vision changes, No blurry vision, No hearing changes. No mouth, throat, or ear problems.  Respiratory: No cough, No SOB Cardiac: No CP, no palpitations GI:  No abd pain, No N/V/D. GU: No Urinary s/sx Musculoskeletal: No joint pain Neuro: No headache, no dizziness, no motor weakness.  Skin: No rash  Endocrine:  No polydipsia. No polyuria.  Psych: Denies SI/HI  PHQ9 #9=1 but denies active plan or intent  No problems updated.  ALLERGIES: Allergies  Allergen Reactions   Ivp Dye [Iodinated Diagnostic Agents] Other (See Comments)      seizures   Tramadol Other (See Comments)    seizures    PAST MEDICAL HISTORY: Past Medical History:  Diagnosis Date   COPD (chronic obstructive pulmonary disease) (HCC)    Coronary artery disease    a. s/p prior PCI.   Hepatitis-C    Hypertension    IV drug abuse (HCC)    a. 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   Medically noncompliant    MI (myocardial infarction) (HCC)    Tobacco abuse     MEDICATIONS AT HOME: Prior to Admission medications   Medication Sig Start Date End Date Taking? Authorizing Provider  albuterol (VENTOLIN HFA) 108 (90 Base) MCG/ACT inhaler Inhale 1-2 puffs into the lungs every 6 (six) hours as needed for wheezing or shortness of breath. 01/17/20 02/16/20 Yes Darinda [place], Marzella Schlein, PA-C  aspirin EC 81 MG tablet Take 1 tablet (81 mg total) by mouth daily. 01/04/20  Yes Tilden Fossa, MD  atorvastatin (LIPITOR) 80 MG tablet Take 1 tablet (80 mg total) by mouth daily at 6 PM. 01/17/20 02/16/20 Yes Jodi Criscuolo, Marzella Schlein, PA-C  carvedilol (COREG) 25 MG tablet Take 1 tablet (25 mg total) by mouth 2 (two) times daily with a meal. 01/17/20 02/16/20 Yes Zariya Minner M, PA-C  furosemide (LASIX) 40 MG tablet Take 1 tablet (40 mg total) by mouth daily. 01/17/20 02/16/20 Yes Karron Alvizo, Marzella Schlein, PA-C  ibuprofen (ADVIL) 200 MG tablet Take 400 mg by mouth every 6 (six) hours as needed for moderate pain.   Yes [provider]  ipratropium-albuterol (DUONEB) 0.5-2.5 (3) MG/3ML SOLN Take 3 mLs by nebulization 3 (three) times daily. 01/17/20 02/16/20 Yes Jeryl Umholtz, Marzella Schlein, PA-C  mometasone-formoterol (DULERA) 200-5 MCG/ACT AERO Inhale 2 puffs into the lungs 2 (two) times daily. 01/17/20 02/16/20 Yes Caspian Deleonardis, Marzella Schlein, PA-C  nitroGLYCERIN (NITROSTAT) 0.4 MG SL tablet Place 1 tablet (0.4 mg total) under the tongue every 5 (five) minutes x 3 doses as needed for chest pain. 08/20/19  Yes Shahmehdi, Seyed A, MD  lisinopril (ZESTRIL) 40 MG tablet Take 1 tablet (40 mg  total) by mouth daily. 01/17/20   Anders Simmonds, PA-C     Objective:  EXAM:   Vitals:   01/17/20 1556 01/17/20 1613  BP: (!) 155/110 (!) 156/114  Pulse: 98   Temp: 98.3 F (36.8 C)   SpO2: 97%     General appearance : A&OX3. NAD. Non-toxic-appearing HEENT: Atraumatic and Normocephalic.  PERRLA. EOM intact. Chest/Lungs:  Breathing-non-labored, Good air entry bilaterally,breath sounds with poor air movement.  diffuse wheezing throughout.  No rales or rhonchi CVS: S1 S2 regular, no murmurs, gallops, rubs  Extremities: Bilateral Lower Ext shows no edema, both legs are warm to touch with = pulse throughout Neurology:  CN II-XII grossly intact, Non focal.   Psych:  TP linear. J/I WNL. Normal speech. Appropriate eye contact and affect.  Skin:  No Rash  Data Review Lab Results  Component Value Date   HGBA1C 5.5 08/18/2019   HGBA1C 5.5 04/29/2019     Assessment & Plan   1. HTN (hypertension), malignant Uncontrolled and exacerbated by substance abuse.  Will not give clonidine here bc unsure what all substances may be on board since he is using illicits.  Increase dose lisinopril  tp 40.  Continue other meds.  Check blood pressures 3 to4 times weekly and record and bring to next visit.   - carvedilol (COREG) 25 MG tablet; Take 1 tablet (25 mg total) by mouth 2 (two) times daily with a meal.  Dispense: 60 tablet; Refill: 3 - furosemide (LASIX) 40 MG tablet; Take 1 tablet (40 mg total) by mouth daily.  Dispense: 30 tablet; Refill: 3 - lisinopril (ZESTRIL) 40 MG tablet; Take 1 tablet (40 mg total) by mouth daily.  Dispense: 90 tablet; Refill: 3  2. Hyperglycemia A1c=5.5 5 months ago I have had a lengthy discussion and provided education about insulin resistance and the intake of too much sugar/refined carbohydrates.  I have advised the patient to work at a goal of eliminating sugary drinks, candy, desserts, sweets, refined sugars, processed foods, and white carbohydrates.  The patient  expresses understanding.    3. Methamphetamine abuse (HCC) I have counseled the patient at length about substance abuse and addiction.  12 step meetings/recovery recommended.  Local 12 step meeting lists were given and attendance was encouraged.  Patient expresses understanding.   - Ambulatory referral to Social Work  4. Chronic obstructive pulmonary disease, unspecified COPD type (HCC) - albuterol (VENTOLIN HFA) 108 (90 Base) MCG/ACT inhaler; Inhale 1-2 puffs into the lungs every 6 (six) hours as needed for wheezing or shortness of breath.  Dispense: 18 g; Refill: 2 - methylPREDNISolone sodium succinate (SOLU-MEDROL) 125 mg/2 mL injection 125 mg - ipratropium-albuterol (DUONEB) 0.5-2.5 (3) MG/3ML SOLN; Take 3 mLs by nebulization 3 (three) times daily.  Dispense: 270 mL; Refill: 0  5. Dyslipidemia - atorvastatin (LIPITOR) 80 MG tablet; Take 1 tablet (80 mg total) by mouth daily at 6 PM.  Dispense: 30 tablet; Refill: 0  6. Positive depression screening-complicated by addiction I have counseled the patient at length about substance abuse and addiction.  12 step meetings/recovery recommended.  Local 12 step meeting lists were given and attendance was encouraged.  Patient expresses understanding. Cessation/abstinence is imperaive  - Ambulatory referral to Social Work  7. Hospital discharge follow-up     Patient have been counseled extensively about nutrition and exercise  Return in about 1 month (around 02/16/2020) for assign PCP;  f/up htn and COPD.  The patient was given clear instructions to go to ER or return to medical center if symptoms don't improve, worsen or new problems develop. The patient verbalized understanding. The patient was told to call to get lab results if they haven't heard anything in the next week.     Georgian Co, PA-C [facility] [place], [place] [phone]   01/17/2020, 4:32 PM

## 2020-01-17 ENCOUNTER — Other Ambulatory Visit: Payer: Self-pay

## 2020-01-17 ENCOUNTER — Ambulatory Visit: Payer: MEDICAID | Attending: Physician Assistant | Admitting: Physician Assistant

## 2020-01-17 ENCOUNTER — Other Ambulatory Visit: Payer: Self-pay | Admitting: Physician Assistant

## 2020-01-17 ENCOUNTER — Encounter: Payer: Self-pay | Admitting: Physician Assistant

## 2020-01-17 VITALS — BP 156/114 | HR 98 | Temp 98.3°F | Wt 185.0 lb

## 2020-01-17 DIAGNOSIS — R739 Hyperglycemia, unspecified: Secondary | ICD-10-CM

## 2020-01-17 DIAGNOSIS — I10 Essential (primary) hypertension: Secondary | ICD-10-CM

## 2020-01-17 DIAGNOSIS — Z09 Encounter for follow-up examination after completed treatment for conditions other than malignant neoplasm: Secondary | ICD-10-CM

## 2020-01-17 DIAGNOSIS — F1510 Other stimulant abuse, uncomplicated: Secondary | ICD-10-CM

## 2020-01-17 DIAGNOSIS — Z1331 Encounter for screening for depression: Secondary | ICD-10-CM

## 2020-01-17 DIAGNOSIS — E785 Hyperlipidemia, unspecified: Secondary | ICD-10-CM

## 2020-01-17 DIAGNOSIS — J449 Chronic obstructive pulmonary disease, unspecified: Secondary | ICD-10-CM

## 2020-01-17 MED ORDER — ATORVASTATIN CALCIUM 80 MG PO TABS: 80.0000 mg | ORAL_TABLET | Freq: Every day | ORAL | 0 refills | Status: DC

## 2020-01-17 MED ORDER — METHYLPREDNISOLONE SODIUM SUCC 125 MG IJ SOLR
125.0000 mg | Freq: Once | INTRAMUSCULAR | Status: AC
  Administered 2020-01-17: 125 mg via INTRAMUSCULAR

## 2020-01-17 MED ORDER — MOMETASONE FURO-FORMOTEROL FUM 200-5 MCG/ACT IN AERO: 2.0000 | INHALATION_SPRAY | Freq: Two times a day (BID) | RESPIRATORY_TRACT | 0 refills | Status: DC

## 2020-01-17 MED ORDER — CARVEDILOL 25 MG PO TABS: 25.0000 mg | ORAL_TABLET | Freq: Two times a day (BID) | ORAL | 3 refills | Status: DC

## 2020-01-17 MED ORDER — FUROSEMIDE 40 MG PO TABS: 40.0000 mg | ORAL_TABLET | Freq: Every day | ORAL | 3 refills | Status: DC

## 2020-01-17 MED ORDER — ALBUTEROL SULFATE HFA 108 (90 BASE) MCG/ACT IN AERS: 1.0000 | INHALATION_SPRAY | Freq: Four times a day (QID) | RESPIRATORY_TRACT | 2 refills | Status: DC | PRN

## 2020-01-17 MED ORDER — LISINOPRIL 40 MG PO TABS: 40.0000 mg | ORAL_TABLET | Freq: Every day | ORAL | 3 refills | Status: DC

## 2020-01-17 MED ORDER — IPRATROPIUM-ALBUTEROL 0.5-2.5 (3) MG/3ML IN SOLN: 3.0000 mL | Freq: Three times a day (TID) | RESPIRATORY_TRACT | 0 refills | Status: DC

## 2020-01-17 NOTE — Patient Instructions (Addendum)
[email or web address].es is the alcoholics anonymous website  [email or web address] is the narcotics anonymous website  Check your blood pressure 3 to 4 times weekly and record

## 2020-01-18 MED FILL — LISINOPRIL 40 MG TABLET: 40 | 30 days supply | Qty: 30 | Fill #0

## 2020-01-18 MED FILL — IPRAT-ALBUT 0.5-3(2.5) MG/3: 0.5-2.5 (3) | 30 days supply | Qty: 270 | Fill #0

## 2020-01-18 MED FILL — ATORVASTATIN CALCIUM 80 MG: 80 | 30 days supply | Qty: 30 | Fill #0

## 2020-01-18 MED FILL — CARVEDILOL 25 MG TABLET: 25 | 30 days supply | Qty: 60 | Fill #0

## 2020-01-18 MED FILL — FUROSEMIDE 40 MG TAB: 40 | 30 days supply | Qty: 30 | Fill #0

## 2020-01-18 MED FILL — ALBUTEROL SULFATE HFA 108 (: 108 (90 BAS | 25 days supply | Qty: 18 | Fill #0

## 2020-01-21 ENCOUNTER — Ambulatory Visit: Payer: Self-pay | Admitting: *Deleted

## 2020-01-21 NOTE — Telephone Encounter (Signed)
Patient was in the office on Thursday of last week- he was given Rx for his breathing- but could not pick it up that day- pharmacy having computer issues. He needs to know how much his medication is- because he is having trouble breathing. Call to pharmacy- Rx are ready and cost is $42 for all 6 Rx. Patient advised of cost. Advised if he can not afford this- he may need to seek immediate help with breathing from [facility]. Patient states he understands.  Reason for Disposition  [1] Caller requesting NON-URGENT health information AND [2] PCP's office is the best resource  Protocols used: INFORMATION ONLY CALL - NO TRIAGE-A-AH

## 2020-01-31 ENCOUNTER — Other Ambulatory Visit: Payer: Self-pay

## 2020-01-31 ENCOUNTER — Ambulatory Visit: Payer: Self-pay

## 2020-01-31 ENCOUNTER — Ambulatory Visit: Payer: Self-pay | Attending: Critical Care Medicine | Admitting: Licensed Clinical Social Worker

## 2020-01-31 DIAGNOSIS — F331 Major depressive disorder, recurrent, moderate: Secondary | ICD-10-CM

## 2020-01-31 MED FILL — LISINOPRIL 40 MG TABLET: 40 | 30 days supply | Qty: 30 | Fill #0

## 2020-01-31 MED FILL — FUROSEMIDE 40 MG TAB: 40 | 30 days supply | Qty: 30 | Fill #0

## 2020-01-31 MED FILL — ALBUTEROL SULFATE HFA 108 (: 108 (90 BAS | 25 days supply | Qty: 18 | Fill #0

## 2020-01-31 MED FILL — CARVEDILOL 25 MG TABLET: 25 | 30 days supply | Qty: 60 | Fill #0

## 2020-02-18 NOTE — BH Specialist Note (Signed)
Integrated Behavioral Health Initial Visit  MRN: [id number] Name: Francisco [name]  Number of Integrated Behavioral Health Clinician visits:: 1/6 Session Start time: 1:40 PM  Session End time: 2:15 PM Total time: 35   Type of Service: Integrated Behavioral Health- Individual Interpretor:No. Interpretor Name and Language: NA   SUBJECTIVE: Francisco [name] is a 59 y.o. male accompanied by self Patient was referred by Trena Platt for depression. Patient reports the following symptoms/concerns: Pt reports difficulty managing depression and anxiety symptoms triggered by physical health conditions. Pt has been diagnosed with chronic medical conditions that has negatively impacted "my ability to do anything" Duration of problem: Ongoing; Severity of problem: moderate  OBJECTIVE: Mood: Anxious and Affect: Appropriate Risk of harm to self or others: Suicidal ideation No plan to harm self or others Pt scored positive on phq9; however, denies plan or intent to harm self or others. Protective factors identified, safety plan discussed, and crisis intervention resources provided  LIFE CONTEXT: Family and Social: Pt receives limited support from friends School/Work: Pt is uninsured and has no income Self-Care: Pt has decreased cigarette use to 2 packs daily to half a pack daily. Hx of polysubstance use Life Changes: Pt reports difficulty managing chronic medical health and mental health conditions. Pt reports polysubstance use   GOALS ADDRESSED: Patient will: 1. Increase knowledge and/or ability of: coping skills Pt agreed to utilize healthy coping skills discussed 2. Demonstrate ability to: Increase healthy adjustment to current life circumstances and Increase adequate support systems for patient/family Pt agreed to apply for financial assistance through [facility] to assist with medical coverage to better manage health conditions, in addition, to SCAT application to address barrier to care (transportation) Pt  agreed to utilize supportive resources, in the case of crisis  INTERVENTIONS: Interventions utilized: Solution-Focused Strategies, Supportive Counseling, Psychoeducation and/or Health Education and Link to [employer]  Standardized Assessments completed: GAD-7  ASSESSMENT: Patient currently experiencing difficulty managing depression and anxiety symptoms triggered by polysubstance use and psychosocial stressors. Pt receives limited support. Endorses decrease in symptoms with compliance to medications. Phq9 and Gad7 scores decreased from last visit on 01/17/20   Patient may benefit from therapy and medication management. Pt scored positive on phq9; however, denies current SI, plan, or intent of harming self or others. Pt successfully identified healthy coping skills to assist with management of symptoms.   LCSW discussed benefits of applying for Financial Assistance, in addition, to SCAT to reduce barriers to medical care. Crisis intervention resources provided.  PLAN: 1. Follow up with behavioral health clinician on : Contact LCSW with any additional behavioral health and/or resource needs 2. Behavioral recommendations: Utilize strategies discussed, resources provided, and comply with medications 3. Referral(s): Integrated Art gallery manager (In Clinic) and [employer]:  Interior and spatial designer 4. "From scale of 1-10, how likely are you to follow plan?":   Bridgett Larsson, LCSW 02/18/20 11:43 PM

## 2020-02-28 ENCOUNTER — Ambulatory Visit: Payer: Self-pay | Admitting: Critical Care Medicine

## 2020-02-28 NOTE — Progress Notes (Deleted)
   Subjective:    Patient ID: Francisco [name], male    DOB: 04/28/1960, 59 y.o.   MRN: [id number]  59 y.o.M here to est PCP  Hx of CM, CAD, ACS, syst/dias HF  COPD     Review of Systems     Objective:   Physical Exam        Assessment & Plan:

## 2020-03-11 ENCOUNTER — Other Ambulatory Visit: Payer: Self-pay | Admitting: Physician Assistant

## 2020-03-11 DIAGNOSIS — J449 Chronic obstructive pulmonary disease, unspecified: Secondary | ICD-10-CM

## 2020-03-11 MED FILL — ALBUTEROL SULFATE HFA 108 (: 108 (90 BAS | 25 days supply | Qty: 18 | Fill #1

## 2020-03-11 MED FILL — IPRAT-ALBUT 0.5-3(2.5) MG/3: 0.5-2.5 (3) | 30 days supply | Qty: 270 | Fill #0

## 2020-03-11 MED FILL — LISINOPRIL 40 MG TABLET: 40 | 30 days supply | Qty: 30 | Fill #1

## 2020-03-11 MED FILL — FUROSEMIDE 40 MG TAB: 40 | 30 days supply | Qty: 30 | Fill #1

## 2020-05-06 ENCOUNTER — Encounter (HOSPITAL_COMMUNITY): Payer: Self-pay | Admitting: Emergency Medicine

## 2020-05-06 ENCOUNTER — Other Ambulatory Visit: Payer: Self-pay | Admitting: Physician Assistant

## 2020-05-06 ENCOUNTER — Emergency Department (HOSPITAL_COMMUNITY)
Admission: EM | Admit: 2020-05-06 | Discharge: 2020-05-06 | Disposition: A | Discharge status: Home / Self Care | Payer: Self-pay | Attending: Emergency Medicine | Admitting: Emergency Medicine

## 2020-05-06 ENCOUNTER — Emergency Department (HOSPITAL_COMMUNITY): Payer: Self-pay

## 2020-05-06 DIAGNOSIS — I2510 Atherosclerotic heart disease of native coronary artery without angina pectoris: Secondary | ICD-10-CM | POA: Insufficient documentation

## 2020-05-06 DIAGNOSIS — I110 Hypertensive heart disease with heart failure: Secondary | ICD-10-CM | POA: Insufficient documentation

## 2020-05-06 DIAGNOSIS — J441 Chronic obstructive pulmonary disease with (acute) exacerbation: Secondary | ICD-10-CM | POA: Insufficient documentation

## 2020-05-06 DIAGNOSIS — I5043 Acute on chronic combined systolic (congestive) and diastolic (congestive) heart failure: Secondary | ICD-10-CM | POA: Insufficient documentation

## 2020-05-06 DIAGNOSIS — F17210 Nicotine dependence, cigarettes, uncomplicated: Secondary | ICD-10-CM | POA: Insufficient documentation

## 2020-05-06 DIAGNOSIS — J449 Chronic obstructive pulmonary disease, unspecified: Secondary | ICD-10-CM

## 2020-05-06 DIAGNOSIS — R000 Tachycardia, unspecified: Secondary | ICD-10-CM | POA: Insufficient documentation

## 2020-05-06 DIAGNOSIS — Z79899 Other long term (current) drug therapy: Secondary | ICD-10-CM | POA: Insufficient documentation

## 2020-05-06 DIAGNOSIS — U071 COVID-19: Secondary | ICD-10-CM | POA: Insufficient documentation

## 2020-05-06 DIAGNOSIS — Z7951 Long term (current) use of inhaled steroids: Secondary | ICD-10-CM | POA: Insufficient documentation

## 2020-05-06 DIAGNOSIS — Z7982 Long term (current) use of aspirin: Secondary | ICD-10-CM | POA: Insufficient documentation

## 2020-05-06 DIAGNOSIS — Z951 Presence of aortocoronary bypass graft: Secondary | ICD-10-CM | POA: Insufficient documentation

## 2020-05-06 DIAGNOSIS — E785 Hyperlipidemia, unspecified: Secondary | ICD-10-CM

## 2020-05-06 DIAGNOSIS — I10 Essential (primary) hypertension: Secondary | ICD-10-CM

## 2020-05-06 LAB — CBC
HCT: 52.2 % — ABNORMAL HIGH (ref 39.0–52.0)
Hemoglobin: 17.2 g/dL — ABNORMAL HIGH (ref 13.0–17.0)
MCH: 31.8 pg (ref 26.0–34.0)
MCHC: 33 g/dL (ref 30.0–36.0)
MCV: 96.5 fL (ref 80.0–100.0)
Platelets: 224 10*3/uL (ref 150–400)
RBC: 5.41 MIL/uL (ref 4.22–5.81)
RDW: 13.1 % (ref 11.5–15.5)
WBC: 5 10*3/uL (ref 4.0–10.5)
nRBC: 0 % (ref 0.0–0.2)

## 2020-05-06 LAB — RESP PANEL BY RT-PCR (FLU A&B, COVID) ARPGX2
Influenza A by PCR: NEGATIVE
Influenza B by PCR: NEGATIVE
SARS Coronavirus 2 by RT PCR: POSITIVE — AB

## 2020-05-06 LAB — LACTIC ACID, PLASMA: Lactic Acid, Venous: 1.2 mmol/L (ref 0.5–1.9)

## 2020-05-06 LAB — TROPONIN I (HIGH SENSITIVITY)
Troponin I (High Sensitivity): 52 ng/L — ABNORMAL HIGH (ref <18)
Troponin I (High Sensitivity): 55 ng/L — ABNORMAL HIGH (ref <18)

## 2020-05-06 LAB — BASIC METABOLIC PANEL
Anion gap: 11 (ref 5–15)
BUN: 6 mg/dL (ref 6–20)
CO2: 27 mmol/L (ref 22–32)
Calcium: 9.3 mg/dL (ref 8.9–10.3)
Chloride: 96 mmol/L — ABNORMAL LOW (ref 98–111)
Creatinine, Ser: 1.04 mg/dL (ref 0.61–1.24)
GFR, Estimated: >60 mL/min (ref >60)
Glucose, Bld: 90 mg/dL (ref 70–99)
Potassium: 3.7 mmol/L (ref 3.5–5.1)
Sodium: 134 mmol/L — ABNORMAL LOW (ref 135–145)

## 2020-05-06 MED ORDER — CARVEDILOL 25 MG PO TABS
25.0000 mg | ORAL_TABLET | Freq: Two times a day (BID) | ORAL | 0 refills | Status: DC
Start: 2020-05-06 — End: 2020-05-06

## 2020-05-06 MED ORDER — PREDNISONE 50 MG PO TABS: 50.0000 mg | ORAL_TABLET | Freq: Every day | ORAL | 0 refills | Status: DC

## 2020-05-06 MED ORDER — CARVEDILOL 25 MG PO TABS
25.0000 mg | ORAL_TABLET | Freq: Two times a day (BID) | ORAL | 0 refills | Status: DC
Start: 2020-05-06 — End: 2020-08-06

## 2020-05-06 MED ORDER — CARVEDILOL 3.125 MG PO TABS: 25.0000 mg | ORAL_TABLET | Freq: Two times a day (BID) | ORAL | Status: DC

## 2020-05-06 MED ORDER — ALBUTEROL SULFATE HFA 108 (90 BASE) MCG/ACT IN AERS: 1.0000 | INHALATION_SPRAY | Freq: Four times a day (QID) | RESPIRATORY_TRACT | 2 refills | Status: DC | PRN

## 2020-05-06 MED ORDER — AZITHROMYCIN 250 MG PO TABS: ORAL_TABLET | ORAL | 0 refills | Status: DC

## 2020-05-06 MED ORDER — ATORVASTATIN CALCIUM 80 MG PO TABS: 80.0000 mg | ORAL_TABLET | Freq: Every day | ORAL | 0 refills | Status: DC

## 2020-05-06 MED ORDER — IPRATROPIUM BROMIDE HFA 17 MCG/ACT IN AERS
2.0000 | INHALATION_SPRAY | Freq: Once | RESPIRATORY_TRACT | Status: AC
  Administered 2020-05-06: 2 via RESPIRATORY_TRACT
  Filled 2020-05-06: qty 12.9

## 2020-05-06 MED ORDER — ALBUTEROL SULFATE HFA 108 (90 BASE) MCG/ACT IN AERS
6.0000 | INHALATION_SPRAY | Freq: Once | RESPIRATORY_TRACT | Status: AC
  Administered 2020-05-06: 6 via RESPIRATORY_TRACT
  Filled 2020-05-06: qty 6.7

## 2020-05-06 MED ORDER — LISINOPRIL 40 MG PO TABS: 40.0000 mg | ORAL_TABLET | Freq: Every day | ORAL | 0 refills | Status: DC

## 2020-05-06 MED ORDER — MOMETASONE FURO-FORMOTEROL FUM 200-5 MCG/ACT IN AERO: 2.0000 | INHALATION_SPRAY | Freq: Two times a day (BID) | RESPIRATORY_TRACT | 0 refills | Status: DC

## 2020-05-06 MED ORDER — LISINOPRIL 20 MG PO TABS
20.0000 mg | ORAL_TABLET | Freq: Once | ORAL | Status: AC
  Administered 2020-05-06: 20 mg via ORAL
  Filled 2020-05-06: qty 1

## 2020-05-06 MED ORDER — METHYLPREDNISOLONE SODIUM SUCC 125 MG IJ SOLR
125.0000 mg | Freq: Once | INTRAMUSCULAR | Status: AC
  Administered 2020-05-06: 125 mg via INTRAVENOUS
  Filled 2020-05-06: qty 2

## 2020-05-06 NOTE — ED Provider Notes (Signed)
[facility] EMERGENCY DEPARTMENT Provider Note   CSN: [id number] Arrival date & time: 05/06/20  1335     History No chief complaint on file.   Francisco [name] is a 60 y.o. male with PMH of COPD, significant tobacco use, CHF, CAD s/p PCI x2, MI, and polysubstance use including IV methamphetamine in past 48 hours who presents to the ED with complaints of shortness of breath.  On my examination, patient reports that he lives in a house with a few of his friends.  He states that he has been experiencing shortness of breath symptoms for the past 7 to 10 days comparable to his history of COPD exacerbations.  He has been coughing more than usual.  Denies any hemoptysis.  The past couple of days, he has also been experiencing intermittent fevers and chills.  He has been fatigued with generalized malaise x5 days.  This morning he also developed onset of chest pain described as "pressure" that lasted approximately 30 minutes before resolving spontaneously.  He was resting and "doing nothing" at time of onset.  Denies any associated diaphoresis or nausea symptoms.  No chest pain currently.  He is however endorsing a headache.  No room-spinning dizziness, He tells me that he has been out of all of his medications including those for his hypertension and COPD for approximately 1 month.  I reviewed patient's medical record and he was hospitalized 01/10/2020 to 01/11/2020 for COPD exacerbation before he left AMA.  He has a history of medication noncompliance.  He followed up with his primary care provider on 01/17/2020 at [facility].  He is supposed be taking Coreg 25 mg twice daily, lisinopril 20 mg daily, Lasix 40 mg daily, atorvastatin 80 mg daily, and his albuterol inhaler as needed.  He endorses a 100-pack-year smoking history, reduced daily intake from 2 packs/day to half a pack per day.  Patient is not immunized for COVID-19.    HPI     Past Medical History:   Diagnosis Date  . COPD (chronic obstructive pulmonary disease) (HCC)   . Coronary artery disease    a. s/p prior PCI.  Marland Kitchen Hepatitis-C   . Hypertension   . IV drug abuse (HCC)    a. 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  . Medically noncompliant   . MI (myocardial infarction) (HCC)   . Tobacco abuse     Patient Active Problem List   Diagnosis Date Noted  . Acute on chronic respiratory failure with hypoxia and hypercapnia (HCC) 01/10/2020  . COPD exacerbation (HCC) 01/09/2020  . Hypotension 08/18/2019  . Acute on chronic combined systolic (congestive) and diastolic (congestive) heart failure (HCC) 08/17/2019  . 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  . Chronic obstructive pulmonary disease (HCC)   . Elevated troponin   . Unstable angina (HCC)   . Dilated cardiomyopathy (HCC)   . CAD S/P percutaneous coronary angioplasty   . ACS (acute coronary syndrome) (HCC) 04/29/2019  . Acute respiratory failure with hypoxia (HCC) 06/17/2018    Past Surgical History:  Procedure Laterality Date  . BACK SURGERY    . NECK SURGERY    . RIGHT/LEFT HEART CATH AND CORONARY ANGIOGRAPHY N/A 04/30/2019   Procedure: RIGHT/LEFT HEART CATH AND CORONARY ANGIOGRAPHY;  Surgeon: Marykay Lex, MD;  Location: [facility] INVASIVE CV LAB;  Service: Cardiovascular;  Laterality: N/A;  . stints         Family History  Problem Relation Age of Onset  . Rheum arthritis Mother  Social History   Tobacco Use  . Smoking status: Current Every Day Smoker    Packs/day: 0.75    Types: Cigarettes  . Smokeless tobacco: Never Used  Vaping Use  . Vaping Use: Never used  Substance Use Topics  . Alcohol use: Never  . Drug use: Yes    Types: Methamphetamines    Comment: heroin    Home Medications Prior to Admission medications   Medication Sig Start Date End Date Taking? Authorizing Provider  aspirin EC 81 MG tablet Take 1 tablet (81 mg total) by mouth daily. 01/04/20  Yes Tilden Fossaees, Elizabeth, MD   furosemide (LASIX) 40 MG tablet Take 1 tablet (40 mg total) by mouth daily. 01/17/20 02/16/20 Yes McClung, Marzella SchleinAngela M, PA-C  ibuprofen (ADVIL) 200 MG tablet Take 400 mg by mouth every 6 (six) hours as needed for moderate pain.   Yes [provider]  ipratropium-albuterol (DUONEB) 0.5-2.5 (3) MG/3ML SOLN TAKE 3 MLS BY NEBULIZATION 3 (THREE) TIMES DAILY. Patient taking differently: Take 3 mLs by nebulization 3 (three) times daily as needed (for shortness of breath or wheezing). 03/11/20 04/10/20 Yes McClung, Marzella SchleinAngela M, PA-C  nitroGLYCERIN (NITROSTAT) 0.4 MG SL tablet Place 1 tablet (0.4 mg total) under the tongue every 5 (five) minutes x 3 doses as needed for chest pain. 08/20/19  Yes Shahmehdi, Seyed A, MD  albuterol (VENTOLIN HFA) 108 (90 Base) MCG/ACT inhaler Inhale 1-2 puffs into the lungs every 6 (six) hours as needed for wheezing or shortness of breath. 05/06/20   Lorelee NewGreen, Keevan Wolz L, PA-C  atorvastatin (LIPITOR) 80 MG tablet Take 1 tablet (80 mg total) by mouth daily at 6 PM. 05/06/20 06/05/20  Chilton SiGreen, Sharion SettlerGarrett L, PA-C  azithromycin (ZITHROMAX Z-PAK) 250 MG tablet 500 mg PO Day 1, 250 mg PO Day 2-5 05/06/20   Chilton SiGreen, Sharion SettlerGarrett L, PA-C  carvedilol (COREG) 25 MG tablet Take 1 tablet (25 mg total) by mouth 2 (two) times daily with a meal. 05/06/20 06/05/20  Chilton SiGreen, Sharion SettlerGarrett L, PA-C  lisinopril (ZESTRIL) 40 MG tablet Take 1 tablet (40 mg total) by mouth daily. 05/06/20   Lorelee NewGreen, Jeanclaude Wentworth L, PA-C  mometasone-formoterol (DULERA) 200-5 MCG/ACT AERO Inhale 2 puffs into the lungs 2 (two) times daily. Patient not taking: No sig reported 05/06/20 06/05/20  Lorelee NewGreen, Anjelika Ausburn L, PA-C  predniSONE (DELTASONE) 50 MG tablet Take 1 tablet (50 mg total) by mouth daily with breakfast. 05/06/20   Lorelee NewGreen, Sia Gabrielsen L, PA-C    Allergies    Ivp dye [iodinated diagnostic agents] and Tramadol  Review of Systems   Review of Systems  All other systems reviewed and are negative.   Physical Exam Updated Vital Signs BP (!) 176/111   Pulse  (!) 107   Temp 98.5 F (36.9 C) (Oral)   Resp (!) 22   Ht 6' (1.829 m)   Wt 79.4 kg   SpO2 95%   BMI 23.73 kg/m   Physical Exam Vitals and nursing note reviewed. Exam conducted with a chaperone present.  Constitutional:      General: He is not in acute distress.    Appearance: He is ill-appearing. He is not toxic-appearing.  HENT:     Head: Normocephalic and atraumatic.  Eyes:     General: No scleral icterus.    Conjunctiva/sclera: Conjunctivae normal.  Cardiovascular:     Rate and Rhythm: Regular rhythm. Tachycardia present.  Pulmonary:     Breath sounds: Wheezing present.     Comments: Increased work of breathing.  Tachypneic.  Significant wheezing  auscultated in all lung fields. Musculoskeletal:        General: Normal range of motion.     Cervical back: Normal range of motion.  Skin:    General: Skin is dry.     Capillary Refill: Capillary refill takes less than 2 seconds.  Neurological:     Mental Status: He is alert and oriented to person, place, and time.     GCS: GCS eye subscore is 4. GCS verbal subscore is 5. GCS motor subscore is 6.  Psychiatric:        Mood and Affect: Mood normal.        Behavior: Behavior normal.        Thought Content: Thought content normal.      ED Results / Procedures / Treatments   Labs (all labs ordered are listed, but only abnormal results are displayed) Labs Reviewed  RESP PANEL BY RT-PCR (FLU A&B, COVID) ARPGX2 - Abnormal; Notable for the following components:      Result Value   SARS Coronavirus 2 by RT PCR POSITIVE (*)    All other components within normal limits  BASIC METABOLIC PANEL - Abnormal; Notable for the following components:   Sodium 134 (*)    Chloride 96 (*)    All other components within normal limits  CBC - Abnormal; Notable for the following components:   Hemoglobin 17.2 (*)    HCT 52.2 (*)    All other components within normal limits  TROPONIN I (HIGH SENSITIVITY) - Abnormal; Notable for the following  components:   Troponin I (High Sensitivity) 52 (*)    All other components within normal limits  CULTURE, BLOOD (ROUTINE X 2)  CULTURE, BLOOD (ROUTINE X 2)  LACTIC ACID, PLASMA  LACTIC ACID, PLASMA  TROPONIN I (HIGH SENSITIVITY)    EKG EKG Interpretation  Date/Time:  Tuesday May 06 2020 14:43:33 EST Ventricular Rate:  122 PR Interval:  120 QRS Duration: 92 QT Interval:  324 QTC Calculation: 461 R Axis:   85 Text Interpretation: Sinus tachycardia with Premature atrial complexes Right atrial enlargement ST & T wave abnormality, consider inferior ischemia Abnormal ECG since last tracing no significant change Confirmed by Mancel Bale [phone]) on 05/06/2020 5:01:23 PM   Radiology DG Chest 2 View  Result Date: 05/06/2020 CLINICAL DATA:  Chest pain and shortness of breath EXAM: CHEST - 2 VIEW COMPARISON:  01/10/2020 FINDINGS: EKG leads project over chest. Normal heart size, mediastinal contours, and pulmonary vascularity. Coronary arterial stents noted. Lungs clear. No infiltrate, pleural effusion, or pneumothorax. Superior endplate height losses of several midthoracic vertebra are noted, unchanged. IMPRESSION: No acute abnormalities. Electronically Signed   By: Ulyses Southward M.D.   On: 05/06/2020 15:38    Procedures Procedures (including critical care time)  Medications Ordered in ED Medications  carvedilol (COREG) tablet 25 mg (has no administration in time range)  methylPREDNISolone sodium succinate (SOLU-MEDROL) 125 mg/2 mL injection 125 mg (125 mg Intravenous Given 05/06/20 1808)  albuterol (VENTOLIN HFA) 108 (90 Base) MCG/ACT inhaler 6 puff (6 puffs Inhalation Given 05/06/20 1808)  ipratropium (ATROVENT HFA) inhaler 2 puff (2 puffs Inhalation Given 05/06/20 2016)  lisinopril (ZESTRIL) tablet 20 mg (20 mg Oral Given 05/06/20 2015)    ED Course  I have reviewed the triage vital signs and the nursing notes.  Pertinent labs & imaging results that were available during my care of  the patient were reviewed by me and considered in my medical decision making (see chart for details).  MDM Rules/Calculators/A&P                          Jaymier Edmundson was evaluated in Emergency Department on 05/06/2020 for the symptoms described in the history of present illness. He was evaluated in the context of the global COVID-19 pandemic, which necessitated consideration that the patient might be at risk for infection with the SARS-CoV-2 virus that causes COVID-19. Institutional protocols and algorithms that pertain to the evaluation of patients at risk for COVID-19 are in a state of rapid change based on information released by regulatory bodies including the CDC and federal and state organizations. These policies and algorithms were followed during the patient's care in the ED.  I personally reviewed patient's medical chart and all notes from triage and staff during today's encounter. I have also ordered and reviewed all labs and imaging that I felt to be medically necessary in the evaluation of this patient's complaints and with consideration of their with their physical exam. If needed, translation services were available and utilized.   Patient with significant wheezing auscultated in all lung fields on my examination.  His history and physical exam is suggestive of COPD exacerbation.  Given his tachycardia, tachypnea, borderline fever here in the ED at 100 F, and reports of subjective fevers and chills at home, will obtain blood cultures and lactic acid in addition to basic labs.  We will also obtain COVID-19 testing given his report of headache, generalized malaise, fatigue, cough, and subjective fevers.  He is unvaccinated.  We will also trend troponin x2 given chest pressure pain that he developed this morning in addition to his symptoms.  Chest x-ray is personally reviewed and without evidence of pleural effusion, edema, infiltrate, or other acute abnormalities.  Patient to be treated  with Solu-Medrol, Atrovent, and albuterol here in the ED.  On reassessment, patient feels incredibly improved.  He feels reasonable for discharge.  I listen to his lungs again and there is no more wheezing.  He feels back at his baseline.  Provided patient with his home antihypertensive medications including Zestril and Coreg given persistently elevated blood pressures here in the ED, as high as 195/113.  I have continued to check his temperature here in the ED and he has been persistently around 99 F.  COVID-19 panel pending, but given that his shortness of breath symptoms have improved and he is 98% on room air, patient will be reasonable for discharge and outpatient follow-up.  I will also discharge him home with his medications x1 month until he can be seen by his primary care provider.  As for his borderline fever, suspect possible COVID-19 or other viral infection.  Patient to maintain isolation precautions if he tests positive.  Patient states that his chest pain began at 7 AM this morning and so his initial troponin which was comparable to his baseline was obtained 8 hours after onset chest pain.  While I obtain second troponin, the lab states that the machine is down and it would likely be a while.  It was atypical for ACS.  Patient does not want to wait.  He denies any chest pain.  I cautioned him on possibility of delta troponin given his earlier chest discomfort, but patient feels completely improved and would like to go home.  Discussed very strict ED return precautions.  Patient voices understanding and is agreeable.  I discussed this with Dr. Effie Shy and he is also understanding and agreeable.  The patient was counseled on the dangers of tobacco use, and was advised to quit.  Reviewed strategies to maximize success, including removing cigarettes and smoking materials from environment, stress management, substitution of other forms of reinforcement, support of family/friends and written  materials. Total time was 5 min CPT code 93716.    Final Clinical Impression(s) / ED Diagnoses Final diagnoses:  COPD with acute exacerbation (HCC)    Rx / DC Orders ED Discharge Orders         Ordered    albuterol (VENTOLIN HFA) 108 (90 Base) MCG/ACT inhaler  Every 6 hours PRN,   Status:  Discontinued        05/06/20 2058    atorvastatin (LIPITOR) 80 MG tablet  Daily-1800,   Status:  Discontinued        05/06/20 2058    carvedilol (COREG) 25 MG tablet  2 times daily with meals,   Status:  Discontinued        05/06/20 2058    lisinopril (ZESTRIL) 40 MG tablet  Daily,   Status:  Discontinued        05/06/20 2058    mometasone-formoterol (DULERA) 200-5 MCG/ACT AERO  2 times daily        05/06/20 2058    predniSONE (DELTASONE) 50 MG tablet  Daily with breakfast,   Status:  Discontinued        05/06/20 2100    azithromycin (ZITHROMAX Z-PAK) 250 MG tablet  Status:  Discontinued        05/06/20 2100    albuterol (VENTOLIN HFA) 108 (90 Base) MCG/ACT inhaler  Every 6 hours PRN        05/06/20 2157    atorvastatin (LIPITOR) 80 MG tablet  Daily-1800        05/06/20 2157    azithromycin (ZITHROMAX Z-PAK) 250 MG tablet        05/06/20 2157    carvedilol (COREG) 25 MG tablet  2 times daily with meals        05/06/20 2157    lisinopril (ZESTRIL) 40 MG tablet  Daily        05/06/20 2157    predniSONE (DELTASONE) 50 MG tablet  Daily with breakfast        05/06/20 2157    Ambulatory referral for Covid Treatment        05/06/20 2200           Elvera Maria 05/06/20 2202    Mancel Bale, MD 05/06/20 [phone]

## 2020-05-06 NOTE — ED Triage Notes (Signed)
Pt admits to IV herion use in the last 2 days also

## 2020-05-06 NOTE — Discharge Instructions (Signed)
I have refilled many of your regularly prescribed medications including those for your high cholesterol, high blood pressure, and COPD.  I have also prescribed you prednisone and Zithromax which I would like for you to take for your current COPD exacerbation.  Take all of your medications, as directed.   Additionally, you have tested positive for COVID-19.    Please maintain isolation precautions.  Check your temperature regularly and take Tylenol as needed for fever control.  Increase your oral hydration and continue to eat regular meals to avoid electrolyte derangement and further fatigue.  I recommend over-the-counter medications as needed for symptom relief.  You may wish to consider obtaining a pulse oximeter over-the-counter at a pharmacy.  If you are below 90% oxygen saturation, you will need to return to the ED.   Given that you are high risk, I have placed an ambulatory referral to the outpatient infusion center. They will call you to determine if you are a viable candidate for treatment.   Follow-up with your primary care provider regarding today's encounter and for ongoing management.  If you do not have one, please get established with one as soon as possible.  If you do not have insurance, please consider the [facility] and [facility].  Return to the ED or seek immediate medical attention should you experience any new or worsening symptoms.    If you have additional questions, contact your local health department or call the epidemiologist on call at [phone] (available 24/7). ? This guidance is subject to change. For the most up-to-date guidance from [facility], please refer to their website: [email or web address]   I strongly encourage you to follow-up with your primary care provider regarding today's encounter as soon as possible.  You need ongoing evaluation and management.

## 2020-05-06 NOTE — ED Triage Notes (Signed)
Pt here  From home with c/o sob and not feeling well, pt has not had his meds in about 2 weeks , hypertensive in triage

## 2020-05-07 MED FILL — LISINOPRIL 40 MG TABLET: 40 | 30 days supply | Qty: 30 | Fill #0

## 2020-05-07 MED FILL — CARVEDILOL 25 MG TABLET: 25 | 30 days supply | Qty: 60 | Fill #0

## 2020-05-07 MED FILL — AZITHROMYCIN 250 MG TABLET: 250 | 5 days supply | Qty: 6 | Fill #0

## 2020-05-07 MED FILL — predniSONE 10 MG TABS: 10 | 5 days supply | Qty: 25 | Fill #0

## 2020-05-07 MED FILL — ATORVASTATIN CALCIUM 80 MG: 80 | 30 days supply | Qty: 30 | Fill #0

## 2020-05-07 MED FILL — ALBUTEROL SULFATE HFA 108 (: 108 (90 BAS | 25 days supply | Qty: 18 | Fill #0

## 2020-05-09 ENCOUNTER — Emergency Department (HOSPITAL_COMMUNITY)
Admission: EM | Admit: 2020-05-09 | Discharge: 2020-05-10 | Disposition: A | Discharge status: Home / Self Care | Payer: Self-pay | Attending: Emergency Medicine | Admitting: Emergency Medicine

## 2020-05-09 ENCOUNTER — Other Ambulatory Visit: Payer: Self-pay

## 2020-05-09 ENCOUNTER — Encounter (HOSPITAL_COMMUNITY): Payer: Self-pay | Admitting: Emergency Medicine

## 2020-05-09 ENCOUNTER — Emergency Department (HOSPITAL_COMMUNITY): Payer: Self-pay

## 2020-05-09 DIAGNOSIS — I110 Hypertensive heart disease with heart failure: Secondary | ICD-10-CM | POA: Insufficient documentation

## 2020-05-09 DIAGNOSIS — J441 Chronic obstructive pulmonary disease with (acute) exacerbation: Secondary | ICD-10-CM | POA: Insufficient documentation

## 2020-05-09 DIAGNOSIS — F17210 Nicotine dependence, cigarettes, uncomplicated: Secondary | ICD-10-CM | POA: Insufficient documentation

## 2020-05-09 DIAGNOSIS — I2510 Atherosclerotic heart disease of native coronary artery without angina pectoris: Secondary | ICD-10-CM | POA: Insufficient documentation

## 2020-05-09 DIAGNOSIS — J1282 Pneumonia due to coronavirus disease 2019: Secondary | ICD-10-CM | POA: Insufficient documentation

## 2020-05-09 DIAGNOSIS — I5043 Acute on chronic combined systolic (congestive) and diastolic (congestive) heart failure: Secondary | ICD-10-CM | POA: Insufficient documentation

## 2020-05-09 DIAGNOSIS — U071 COVID-19: Secondary | ICD-10-CM | POA: Insufficient documentation

## 2020-05-09 DIAGNOSIS — Z79899 Other long term (current) drug therapy: Secondary | ICD-10-CM | POA: Insufficient documentation

## 2020-05-09 DIAGNOSIS — Z7982 Long term (current) use of aspirin: Secondary | ICD-10-CM | POA: Insufficient documentation

## 2020-05-09 LAB — CBC
HCT: 48.8 % (ref 39.0–52.0)
Hemoglobin: 16.4 g/dL (ref 13.0–17.0)
MCH: 32.8 pg (ref 26.0–34.0)
MCHC: 33.6 g/dL (ref 30.0–36.0)
MCV: 97.6 fL (ref 80.0–100.0)
Platelets: 276 10*3/uL (ref 150–400)
RBC: 5 MIL/uL (ref 4.22–5.81)
RDW: 13.2 % (ref 11.5–15.5)
WBC: 10.1 10*3/uL (ref 4.0–10.5)
nRBC: 0 % (ref 0.0–0.2)

## 2020-05-09 LAB — BASIC METABOLIC PANEL
Anion gap: 9 (ref 5–15)
BUN: 22 mg/dL — ABNORMAL HIGH (ref 6–20)
CO2: 26 mmol/L (ref 22–32)
Calcium: 9.5 mg/dL (ref 8.9–10.3)
Chloride: 103 mmol/L (ref 98–111)
Creatinine, Ser: 1.03 mg/dL (ref 0.61–1.24)
GFR, Estimated: >60 mL/min (ref >60)
Glucose, Bld: 129 mg/dL — ABNORMAL HIGH (ref 70–99)
Potassium: 4.9 mmol/L (ref 3.5–5.1)
Sodium: 138 mmol/L (ref 135–145)

## 2020-05-09 LAB — TROPONIN I (HIGH SENSITIVITY): Troponin I (High Sensitivity): 25 ng/L — ABNORMAL HIGH (ref <18)

## 2020-05-09 MED ORDER — DEXAMETHASONE SODIUM PHOSPHATE 10 MG/ML IJ SOLN
10.0000 mg | Freq: Once | INTRAMUSCULAR | Status: AC
  Administered 2020-05-10: 10 mg via INTRAVENOUS
  Filled 2020-05-09: qty 1

## 2020-05-09 NOTE — ED Triage Notes (Signed)
Patient lost his balance and fell today injured his nose with bleeding , denies LOC , patient tested positive for Covid 05/06/20 , patient added SOB(COPD) and chest tightness .

## 2020-05-10 MED ORDER — SODIUM CHLORIDE 0.9 % IV SOLN: 100.0000 mg | Freq: Every day | INTRAVENOUS | Status: DC

## 2020-05-10 MED ORDER — ALBUTEROL SULFATE (2.5 MG/3ML) 0.083% IN NEBU: 2.5000 mg | INHALATION_SOLUTION | Freq: Four times a day (QID) | RESPIRATORY_TRACT | 12 refills | Status: DC | PRN

## 2020-05-10 MED ORDER — SODIUM CHLORIDE 0.9 % IV SOLN
200.0000 mg | Freq: Once | INTRAVENOUS | Status: AC
  Administered 2020-05-10: 200 mg via INTRAVENOUS
  Filled 2020-05-10: qty 40

## 2020-05-10 NOTE — ED Provider Notes (Signed)
[facility] EMERGENCY DEPARTMENT Provider Note   CSN: [id number] Arrival date & time: 05/09/20  2051     History Chief Complaint  Patient presents with  . COVID+/Fall/SOB    Francisco [name] is a 60 y.o. male.  Patient presents to the emergency department with multiple problems. Patient reports that he lost his balance earlier today and fell, hitting his nose. He had some bleeding of the nose earlier but this has stopped. Over the course of the day he has developed shortness of breath. Patient reports that he has been experiencing symptoms for the last 5 or 6 days and recently tested positive for COVID. He was seen in the ED at that time, treated with steroids. He does have a history of COPD, has been using his albuterol inhaler every 4 hours but is still wheezing and feeling short of breath.        Past Medical History:  Diagnosis Date  . COPD (chronic obstructive pulmonary disease) (HCC)   . Coronary artery disease    a. s/p prior PCI.  Marland Kitchen Hepatitis-C   . Hypertension   . IV drug abuse (HCC)    a. 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  . Medically noncompliant   . MI (myocardial infarction) (HCC)   . Tobacco abuse     Patient Active Problem List   Diagnosis Date Noted  . Acute on chronic respiratory failure with hypoxia and hypercapnia (HCC) 01/10/2020  . COPD exacerbation (HCC) 01/09/2020  . Hypotension 08/18/2019  . Acute on chronic combined systolic (congestive) and diastolic (congestive) heart failure (HCC) 08/17/2019  . 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  . Chronic obstructive pulmonary disease (HCC)   . Elevated troponin   . Unstable angina (HCC)   . Dilated cardiomyopathy (HCC)   . CAD S/P percutaneous coronary angioplasty   . ACS (acute coronary syndrome) (HCC) 04/29/2019  . Acute respiratory failure with hypoxia (HCC) 06/17/2018    Past Surgical History:  Procedure Laterality Date  . BACK SURGERY    . NECK SURGERY    .  RIGHT/LEFT HEART CATH AND CORONARY ANGIOGRAPHY N/A 04/30/2019   Procedure: RIGHT/LEFT HEART CATH AND CORONARY ANGIOGRAPHY;  Surgeon: Marykay Lex, MD;  Location: [facility] INVASIVE CV LAB;  Service: Cardiovascular;  Laterality: N/A;  . stints         Family History  Problem Relation Age of Onset  . Rheum arthritis Mother     Social History   Tobacco Use  . Smoking status: Current Every Day Smoker    Packs/day: 0.75    Types: Cigarettes  . Smokeless tobacco: Never Used  Vaping Use  . Vaping Use: Never used  Substance Use Topics  . Alcohol use: Never  . Drug use: Yes    Types: Methamphetamines    Comment: heroin    Home Medications Prior to Admission medications   Medication Sig Start Date End Date Taking? Authorizing Provider  albuterol (PROVENTIL) (2.5 MG/3ML) 0.083% nebulizer solution Take 3 mLs (2.5 mg total) by nebulization every 6 (six) hours as needed for wheezing or shortness of breath. 05/10/20  Yes Ramar Nobrega, Canary Brim, MD  albuterol (VENTOLIN HFA) 108 (90 Base) MCG/ACT inhaler Inhale 1-2 puffs into the lungs every 6 (six) hours as needed for wheezing or shortness of breath. 05/06/20   Lorelee New, PA-C  aspirin EC 81 MG tablet Take 1 tablet (81 mg total) by mouth daily. 01/04/20   Tilden Fossa, MD  atorvastatin (LIPITOR) 80 MG tablet Take 1 tablet (80  mg total) by mouth daily at 6 PM. 05/06/20 06/05/20  Chilton Si, Sharion Settler, PA-C  azithromycin (ZITHROMAX Z-PAK) 250 MG tablet 500 mg PO Day 1, 250 mg PO Day 2-5 05/06/20   Lorelee New, PA-C  carvedilol (COREG) 25 MG tablet Take 1 tablet (25 mg total) by mouth 2 (two) times daily with a meal. 05/06/20 06/05/20  Lorelee New, PA-C  furosemide (LASIX) 40 MG tablet Take 1 tablet (40 mg total) by mouth daily. 01/17/20 02/16/20  Anders Simmonds, PA-C  ibuprofen (ADVIL) 200 MG tablet Take 400 mg by mouth every 6 (six) hours as needed for moderate pain.    [provider]  ipratropium-albuterol (DUONEB) 0.5-2.5 (3)  MG/3ML SOLN TAKE 3 MLS BY NEBULIZATION 3 (THREE) TIMES DAILY. Patient taking differently: Take 3 mLs by nebulization 3 (three) times daily as needed (for shortness of breath or wheezing). 03/11/20 04/10/20  Anders Simmonds, PA-C  lisinopril (ZESTRIL) 40 MG tablet Take 1 tablet (40 mg total) by mouth daily. 05/06/20   Lorelee New, PA-C  mometasone-formoterol (DULERA) 200-5 MCG/ACT AERO Inhale 2 puffs into the lungs 2 (two) times daily. Patient not taking: No sig reported 05/06/20 06/05/20  Lorelee New, PA-C  nitroGLYCERIN (NITROSTAT) 0.4 MG SL tablet Place 1 tablet (0.4 mg total) under the tongue every 5 (five) minutes x 3 doses as needed for chest pain. 08/20/19   Shahmehdi, Gemma Payor, MD  predniSONE (DELTASONE) 50 MG tablet Take 1 tablet (50 mg total) by mouth daily with breakfast. 05/06/20   Lorelee New, PA-C    Allergies    Ivp dye [iodinated diagnostic agents] and Tramadol  Review of Systems   Review of Systems  HENT: Positive for nosebleeds.   Respiratory: Positive for shortness of breath and wheezing.   All other systems reviewed and are negative.   Physical Exam Updated Vital Signs BP (!) 167/125   Pulse 86   Temp (!) 97.4 F (36.3 C) (Oral)   Resp (!) 22   Ht 6' (1.829 m)   Wt 79.4 kg   SpO2 98%   BMI 23.73 kg/m   Physical Exam Vitals and nursing note reviewed.  Constitutional:      General: He is not in acute distress.    Appearance: Normal appearance. He is well-developed and well-nourished.  HENT:     Head: Normocephalic and atraumatic.     Right Ear: Hearing normal.     Left Ear: Hearing normal.     Nose: Nasal tenderness present. No nasal deformity.     Right Nostril: No epistaxis or septal hematoma.     Left Nostril: No epistaxis or septal hematoma.     Mouth/Throat:     Mouth: Oropharynx is clear and moist and mucous membranes are normal.  Eyes:     Extraocular Movements: EOM normal.     Conjunctiva/sclera: Conjunctivae normal.     Pupils:  Pupils are equal, round, and reactive to light.  Cardiovascular:     Rate and Rhythm: Regular rhythm.     Heart sounds: S1 normal and S2 normal. No murmur heard. No friction rub. No gallop.   Pulmonary:     Effort: Pulmonary effort is normal. No respiratory distress.     Breath sounds: Wheezing present.  Chest:     Chest wall: No tenderness.  Abdominal:     General: Bowel sounds are normal.     Palpations: Abdomen is soft. There is no hepatosplenomegaly.     Tenderness: There  is no abdominal tenderness. There is no guarding or rebound. Negative signs include Murphy's sign and McBurney's sign.     Hernia: No hernia is present.  Musculoskeletal:        General: Normal range of motion.     Cervical back: Normal range of motion and neck supple.  Skin:    General: Skin is warm, dry and intact.     Findings: No rash.     Nails: There is no cyanosis.  Neurological:     Mental Status: He is alert and oriented to person, place, and time.     GCS: GCS eye subscore is 4. GCS verbal subscore is 5. GCS motor subscore is 6.     Cranial Nerves: No cranial nerve deficit.     Sensory: No sensory deficit.     Coordination: Coordination normal.     Deep Tendon Reflexes: Strength normal.  Psychiatric:        Mood and Affect: Mood and affect normal.        Speech: Speech normal.        Behavior: Behavior normal.        Thought Content: Thought content normal.     ED Results / Procedures / Treatments   Labs (all labs ordered are listed, but only abnormal results are displayed) Labs Reviewed  BASIC METABOLIC PANEL - Abnormal; Notable for the following components:      Result Value   Glucose, Bld 129 (*)    BUN 22 (*)    All other components within normal limits  TROPONIN I (HIGH SENSITIVITY) - Abnormal; Notable for the following components:   Troponin I (High Sensitivity) 25 (*)    All other components within normal limits  CBC  TROPONIN I (HIGH SENSITIVITY)    EKG EKG  Interpretation  Date/Time:  Friday May 09 2020 23:21:11 EST Ventricular Rate:  92 PR Interval:    QRS Duration: 89 QT Interval:  456 QTC Calculation: 565 R Axis:   67 Text Interpretation: Sinus rhythm Right atrial enlargement Borderline repolarization abnormality Prolonged QT interval No significant change since last tracing Confirmed by Gilda Crease [phone]) on 05/09/2020 11:26:33 PM   Radiology DG Chest Portable 1 View  Result Date: 05/09/2020 CLINICAL DATA:  Initial evaluation for acute COVID positive, shortness of breath. EXAM: PORTABLE CHEST 1 VIEW COMPARISON:  Prior radiograph from 05/06/2020. FINDINGS: Transverse heart size at the upper limits of normal. Mediastinal silhouette within normal limits. Aortic atherosclerosis. Lungs normally inflated. Mild scattered peribronchial thickening present within the mid and lower lungs. Superimposed mild linear left basilar subsegmental atelectasis. No frank airspace consolidation. No pulmonary edema or pleural effusion. No pneumothorax. No acute osseous finding. IMPRESSION: 1. Mild scattered peribronchial thickening within the mid and lower lungs, indeterminate, but could reflect acute atypical infection given provided history. 2. Superimposed mild left basilar subsegmental atelectasis. 3.  Aortic Atherosclerosis (ICD10-I70.0). Electronically Signed   By: Rise Mu M.D.   On: 05/09/2020 21:40    Procedures Procedures (including critical care time)  Medications Ordered in ED Medications  dexamethasone (DECADRON) injection 10 mg (has no administration in time range)  remdesivir 200 mg in sodium chloride 0.9% 250 mL IVPB (has no administration in time range)    Followed by  remdesivir 100 mg in sodium chloride 0.9 % 100 mL IVPB (has no administration in time range)    ED Course  I have reviewed the triage vital signs and the nursing notes.  Pertinent labs & imaging results that  were available during my care of the  patient were reviewed by me and considered in my medical decision making (see chart for details).    MDM Rules/Calculators/A&P                          Patient with history of COPD presents to the emergency department with shortness of breath in the setting of COVID infection. He is unvaccinated. Patient diagnosed in the ED 3 days ago, has been using prednisone and albuterol. He returns today with shortness of breath and does have moderate wheezing without hypoxia. No concern for CO2 retention. Chest x-ray with findings consistent with COVID pneumonitis. This is the patient's second visit he does have chronic lung disease. He was referred to [facility] at previous visit but has not heard back. Will initiate remdesivir treatment here in the ED and discharge.  Final Clinical Impression(s) / ED Diagnoses Final diagnoses:  Pneumonia due to COVID-19 virus  Chronic obstructive pulmonary disease with acute exacerbation (HCC)    Rx / DC Orders ED Discharge Orders         Ordered    albuterol (PROVENTIL) (2.5 MG/3ML) 0.083% nebulizer solution  Every 6 hours PRN        05/10/20 0045           Gilda Crease, MD 05/10/20 0045

## 2020-05-11 ENCOUNTER — Telehealth (HOSPITAL_COMMUNITY): Payer: Self-pay

## 2020-05-11 LAB — CULTURE, BLOOD (ROUTINE X 2)
Culture: NO GROWTH
Culture: NO GROWTH
Special Requests: ADEQUATE
Special Requests: ADEQUATE

## 2020-05-11 NOTE — Telephone Encounter (Signed)
RN attempted both home phone number listed as well as cell phone to set up remaining remdesivir infusions as an outpatient. The home number listed is for a hotel. Manager stated he is not staying there currently. Cell phone is not in service. Unable to reach patient at either number.

## 2020-06-13 MED FILL — ALBUTEROL SULFATE HFA 108 (: 108 (90 BAS | 25 days supply | Qty: 18 | Fill #1

## 2020-06-20 MED FILL — ALBUTEROL SULFATE HFA 108 (: 108 (90 BAS | 25 days supply | Qty: 18 | Fill #1

## 2020-07-12 ENCOUNTER — Emergency Department (HOSPITAL_COMMUNITY): Payer: Self-pay

## 2020-07-12 ENCOUNTER — Emergency Department (HOSPITAL_COMMUNITY)
Admission: EM | Admit: 2020-07-12 | Discharge: 2020-07-12 | Disposition: A | Discharge status: Home / Self Care | Payer: Self-pay | Attending: Emergency Medicine | Admitting: Emergency Medicine

## 2020-07-12 ENCOUNTER — Encounter (HOSPITAL_COMMUNITY): Payer: Self-pay

## 2020-07-12 ENCOUNTER — Other Ambulatory Visit: Payer: Self-pay

## 2020-07-12 DIAGNOSIS — I5043 Acute on chronic combined systolic (congestive) and diastolic (congestive) heart failure: Secondary | ICD-10-CM | POA: Insufficient documentation

## 2020-07-12 DIAGNOSIS — R0781 Pleurodynia: Secondary | ICD-10-CM

## 2020-07-12 DIAGNOSIS — I110 Hypertensive heart disease with heart failure: Secondary | ICD-10-CM | POA: Insufficient documentation

## 2020-07-12 DIAGNOSIS — F17210 Nicotine dependence, cigarettes, uncomplicated: Secondary | ICD-10-CM | POA: Insufficient documentation

## 2020-07-12 DIAGNOSIS — J441 Chronic obstructive pulmonary disease with (acute) exacerbation: Secondary | ICD-10-CM | POA: Insufficient documentation

## 2020-07-12 DIAGNOSIS — I10 Essential (primary) hypertension: Secondary | ICD-10-CM

## 2020-07-12 DIAGNOSIS — Z7982 Long term (current) use of aspirin: Secondary | ICD-10-CM | POA: Insufficient documentation

## 2020-07-12 DIAGNOSIS — I2510 Atherosclerotic heart disease of native coronary artery without angina pectoris: Secondary | ICD-10-CM | POA: Insufficient documentation

## 2020-07-12 DIAGNOSIS — Z79899 Other long term (current) drug therapy: Secondary | ICD-10-CM | POA: Insufficient documentation

## 2020-07-12 DIAGNOSIS — R000 Tachycardia, unspecified: Secondary | ICD-10-CM | POA: Insufficient documentation

## 2020-07-12 LAB — BASIC METABOLIC PANEL
Anion gap: 6 (ref 5–15)
BUN: 11 mg/dL (ref 6–20)
CO2: 28 mmol/L (ref 22–32)
Calcium: 9.2 mg/dL (ref 8.9–10.3)
Chloride: 100 mmol/L (ref 98–111)
Creatinine, Ser: 0.95 mg/dL (ref 0.61–1.24)
GFR, Estimated: >60 mL/min (ref >60)
Glucose, Bld: 94 mg/dL (ref 70–99)
Potassium: 3.3 mmol/L — ABNORMAL LOW (ref 3.5–5.1)
Sodium: 134 mmol/L — ABNORMAL LOW (ref 135–145)

## 2020-07-12 LAB — CBC
HCT: 49.5 % (ref 39.0–52.0)
Hemoglobin: 16.6 g/dL (ref 13.0–17.0)
MCH: 32.2 pg (ref 26.0–34.0)
MCHC: 33.5 g/dL (ref 30.0–36.0)
MCV: 96.1 fL (ref 80.0–100.0)
Platelets: 287 10*3/uL (ref 150–400)
RBC: 5.15 MIL/uL (ref 4.22–5.81)
RDW: 13 % (ref 11.5–15.5)
WBC: 10.7 10*3/uL — ABNORMAL HIGH (ref 4.0–10.5)
nRBC: 0 % (ref 0.0–0.2)

## 2020-07-12 LAB — TROPONIN I (HIGH SENSITIVITY): Troponin I (High Sensitivity): 43 ng/L — ABNORMAL HIGH (ref <18)

## 2020-07-12 MED ORDER — ALBUTEROL SULFATE HFA 108 (90 BASE) MCG/ACT IN AERS
4.0000 | INHALATION_SPRAY | Freq: Once | RESPIRATORY_TRACT | Status: AC
  Administered 2020-07-12: 4 via RESPIRATORY_TRACT
  Filled 2020-07-12: qty 6.7

## 2020-07-12 MED ORDER — KETOROLAC TROMETHAMINE 60 MG/2ML IM SOLN
60.0000 mg | Freq: Once | INTRAMUSCULAR | Status: AC
  Administered 2020-07-12: 60 mg via INTRAMUSCULAR
  Filled 2020-07-12: qty 2

## 2020-07-12 MED ORDER — CYCLOBENZAPRINE HCL 10 MG PO TABS: 10.0000 mg | ORAL_TABLET | Freq: Two times a day (BID) | ORAL | 0 refills | Status: DC | PRN

## 2020-07-12 MED ORDER — LISINOPRIL 40 MG PO TABS: 40.0000 mg | ORAL_TABLET | Freq: Every day | ORAL | 0 refills | Status: DC

## 2020-07-12 MED ORDER — PREDNISONE 20 MG PO TABS
60.0000 mg | ORAL_TABLET | Freq: Once | ORAL | Status: AC
  Administered 2020-07-12: 60 mg via ORAL
  Filled 2020-07-12: qty 3

## 2020-07-12 MED ORDER — PREDNISONE 10 MG PO TABS: 40.0000 mg | ORAL_TABLET | Freq: Every day | ORAL | 0 refills | Status: AC

## 2020-07-12 MED ORDER — POTASSIUM CHLORIDE CRYS ER 20 MEQ PO TBCR
40.0000 meq | EXTENDED_RELEASE_TABLET | Freq: Once | ORAL | Status: AC
  Administered 2020-07-12: 40 meq via ORAL
  Filled 2020-07-12: qty 2

## 2020-07-12 MED ORDER — NAPROXEN 500 MG PO TABS: 500.0000 mg | ORAL_TABLET | Freq: Two times a day (BID) | ORAL | 0 refills | Status: DC

## 2020-07-12 MED ORDER — LIDOCAINE 5 % EX PTCH: 1.0000 | MEDICATED_PATCH | CUTANEOUS | 0 refills | Status: DC

## 2020-07-12 NOTE — ED Triage Notes (Signed)
Pt reports that he has COPD, started having increasing SOB yesterday. Has been coughing all day, felt a pop in left rib area, hurts to take a breath.

## 2020-07-12 NOTE — ED Notes (Signed)
Pt continually sticking head outside room door asking for discharge paperwork. Pt up for discharge home, this nurse at bedside to administer medications. Pt remains in standing position, expresses need for urgency stating "I have to go honey, my ride is outside and will leave me if I don't go now. He won;'t be late for work". Pt refused discharge vital signs and wheeled to ED lobby. Pt able to ambulate from wheelchair to front door with strong and steady gait. No acute distress noted.

## 2020-07-12 NOTE — ED Provider Notes (Signed)
[facility] EMERGENCY DEPARTMENT Provider Note   CSN: [id number] Arrival date & time: 07/12/20  1939     History Chief Complaint  Patient presents with  . Shortness of Breath  . Chest Pain    Francisco [name] is a 60 y.o. male.  HPI     60 year old male with history of COPD, coronary artery disease, hypertension, dilated cardiomyopathy, drug abuse who presents with increasing shortness of breath and coughing.  Dyspnea began yesterday, has increased cough and congestion. Increased wheezing.  No chest pain other than area of right rib pain. Was coughing and suddenly felt a pop with right sided rib pain, worse with movements, palpation, coughing.  Denies other chest pain. No fevers. Thinks the pollen is triggering his COPD. Ran out of his blood pressure medications and did not get to pharmacy before it closed on Friday so has not taken them today.  Past Medical History:  Diagnosis Date  . COPD (chronic obstructive pulmonary disease) (HCC)   . Coronary artery disease    a. s/p prior PCI.  Marland Kitchen Hepatitis-C   . Hypertension   . IV drug abuse (HCC)    a. 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  . Medically noncompliant   . MI (myocardial infarction) (HCC)   . Tobacco abuse     Patient Active Problem List   Diagnosis Date Noted  . Acute on chronic respiratory failure with hypoxia and hypercapnia (HCC) 01/10/2020  . COPD exacerbation (HCC) 01/09/2020  . Hypotension 08/18/2019  . Acute on chronic combined systolic (congestive) and diastolic (congestive) heart failure (HCC) 08/17/2019  . 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  . Chronic obstructive pulmonary disease (HCC)   . Elevated troponin   . Unstable angina (HCC)   . Dilated cardiomyopathy (HCC)   . CAD S/P percutaneous coronary angioplasty   . ACS (acute coronary syndrome) (HCC) 04/29/2019  . Acute respiratory failure with hypoxia (HCC) 06/17/2018    Past Surgical History:  Procedure Laterality  Date  . BACK SURGERY    . NECK SURGERY    . RIGHT/LEFT HEART CATH AND CORONARY ANGIOGRAPHY N/A 04/30/2019   Procedure: RIGHT/LEFT HEART CATH AND CORONARY ANGIOGRAPHY;  Surgeon: Marykay Lex, MD;  Location: [facility] INVASIVE CV LAB;  Service: Cardiovascular;  Laterality: N/A;  . stints         Family History  Problem Relation Age of Onset  . Rheum arthritis Mother     Social History   Tobacco Use  . Smoking status: Current Every Day Smoker    Packs/day: 0.75    Types: Cigarettes  . Smokeless tobacco: Never Used  Vaping Use  . Vaping Use: Never used  Substance Use Topics  . Alcohol use: Never  . Drug use: Yes    Types: Methamphetamines    Comment: heroin    Home Medications Prior to Admission medications   Medication Sig Start Date End Date Taking? Authorizing Provider  cyclobenzaprine (FLEXERIL) 10 MG tablet Take 1 tablet (10 mg total) by mouth 2 (two) times daily as needed for muscle spasms. 07/12/20  Yes Alvira Monday, MD  lidocaine (LIDODERM) 5 % Place 1 patch onto the skin daily. Remove & Discard patch within 12 hours or as directed by MD 07/12/20  Yes Alvira Monday, MD  naproxen (NAPROSYN) 500 MG tablet Take 1 tablet (500 mg total) by mouth 2 (two) times daily with a meal. 07/12/20  Yes Alvira Monday, MD  predniSONE (DELTASONE) 10 MG tablet Take 4 tablets (40 mg total) by mouth  daily for 4 days. 07/12/20 07/16/20 Yes Alvira Monday, MD  albuterol (PROVENTIL) (2.5 MG/3ML) 0.083% nebulizer solution Take 3 mLs (2.5 mg total) by nebulization every 6 (six) hours as needed for wheezing or shortness of breath. 05/10/20   Pollina, Canary Brim, MD  albuterol (VENTOLIN HFA) 108 (90 Base) MCG/ACT inhaler Inhale 1-2 puffs into the lungs every 6 (six) hours as needed for wheezing or shortness of breath. 05/06/20   Lorelee New, PA-C  aspirin EC 81 MG tablet Take 1 tablet (81 mg total) by mouth daily. 01/04/20   Tilden Fossa, MD  atorvastatin (LIPITOR) 80 MG tablet Take 1  tablet (80 mg total) by mouth daily at 6 PM. 05/06/20 06/05/20  Chilton Si, Sharion Settler, PA-C  azithromycin (ZITHROMAX Z-PAK) 250 MG tablet 500 mg PO Day 1, 250 mg PO Day 2-5 05/06/20   Lorelee New, PA-C  carvedilol (COREG) 25 MG tablet Take 1 tablet (25 mg total) by mouth 2 (two) times daily with a meal. 05/06/20 06/05/20  Lorelee New, PA-C  furosemide (LASIX) 40 MG tablet Take 1 tablet (40 mg total) by mouth daily. 01/17/20 02/16/20  Anders Simmonds, PA-C  ibuprofen (ADVIL) 200 MG tablet Take 400 mg by mouth every 6 (six) hours as needed for moderate pain.    [provider]  ipratropium-albuterol (DUONEB) 0.5-2.5 (3) MG/3ML SOLN TAKE 3 MLS BY NEBULIZATION 3 (THREE) TIMES DAILY. Patient taking differently: Take 3 mLs by nebulization 3 (three) times daily as needed (for shortness of breath or wheezing). 03/11/20 04/10/20  Anders Simmonds, PA-C  lisinopril (ZESTRIL) 40 MG tablet Take 1 tablet (40 mg total) by mouth daily. 07/12/20   Alvira Monday, MD  mometasone-formoterol (DULERA) 200-5 MCG/ACT AERO Inhale 2 puffs into the lungs 2 (two) times daily. Patient not taking: No sig reported 05/06/20 06/05/20  Lorelee New, PA-C  nitroGLYCERIN (NITROSTAT) 0.4 MG SL tablet Place 1 tablet (0.4 mg total) under the tongue every 5 (five) minutes x 3 doses as needed for chest pain. 08/20/19   Kendell Bane, MD    Allergies    Ivp dye [iodinated diagnostic agents] and Tramadol  Review of Systems   Review of Systems  Constitutional: Negative for fever.  HENT: Positive for congestion and rhinorrhea. Negative for sore throat.   Eyes: Negative for visual disturbance.  Respiratory: Positive for cough, shortness of breath and wheezing.   Cardiovascular: Positive for chest pain (right chest wall pain).  Gastrointestinal: Negative for abdominal pain, nausea and vomiting.  Genitourinary: Negative for difficulty urinating.  Musculoskeletal: Negative for back pain and neck stiffness.  Skin:  Negative for rash.  Neurological: Negative for syncope and headaches.    Physical Exam Updated Vital Signs BP (!) 195/127   Pulse (!) 113   Temp 98.6 F (37 C) (Oral)   Resp (!) 28   Ht 6' (1.829 m)   Wt 81.6 kg   SpO2 96%   BMI 24.41 kg/m   Physical Exam Vitals and nursing note reviewed.  Constitutional:      General: He is not in acute distress.    Appearance: He is well-developed. He is not diaphoretic.  HENT:     Head: Normocephalic and atraumatic.  Eyes:     Conjunctiva/sclera: Conjunctivae normal.  Cardiovascular:     Rate and Rhythm: Regular rhythm. Tachycardia present.     Heart sounds: Normal heart sounds. No murmur heard. No friction rub. No gallop.   Pulmonary:     Effort: Pulmonary effort  is normal. No respiratory distress.     Breath sounds: Wheezing present. No rales.  Chest:     Chest wall: Tenderness (right chest wall) present.  Abdominal:     General: There is no distension.     Palpations: Abdomen is soft.     Tenderness: There is no abdominal tenderness. There is no guarding.  Musculoskeletal:     Cervical back: Normal range of motion.     Right lower leg: No edema.     Left lower leg: No edema.  Skin:    General: Skin is warm and dry.  Neurological:     Mental Status: He is alert and oriented to person, place, and time.     ED Results / Procedures / Treatments   Labs (all labs ordered are listed, but only abnormal results are displayed) Labs Reviewed  BASIC METABOLIC PANEL - Abnormal; Notable for the following components:      Result Value   Sodium 134 (*)    Potassium 3.3 (*)    All other components within normal limits  CBC - Abnormal; Notable for the following components:   WBC 10.7 (*)    All other components within normal limits  TROPONIN I (HIGH SENSITIVITY) - Abnormal; Notable for the following components:   Troponin I (High Sensitivity) 43 (*)    All other components within normal limits    EKG EKG  Interpretation  Date/Time:  Saturday July 12 2020 20:23:49 EDT Ventricular Rate:  110 PR Interval:    QRS Duration: 85 QT Interval:  364 QTC Calculation: 493 R Axis:   67 Text Interpretation: Sinus tachycardia Biatrial enlargement Borderline repolarization abnormality Borderline prolonged QT interval No significant change since last tracing Confirmed by Alvira Monday ([id number]) on 07/12/2020 8:42:07 PM   Radiology DG Chest 2 View  Result Date: 07/12/2020 CLINICAL DATA:  Chest pain, coughing last night, painful to breathe, pain radiates from anterior to posterior, history COPD, coronary artery disease post MI, smoker EXAM: CHEST - 2 VIEW COMPARISON:  05/09/2020, 05/06/2020 FINDINGS: Normal heart size, mediastinal contours, and pulmonary vascularity. Lungs hyperinflated but clear. No acute infiltrate, pleural effusion, or pneumothorax. Osseous demineralization with minimal anterior height loss of adjacent midthoracic vertebra. Prior cervical spine fusion. IMPRESSION: Hyperinflated lungs. No acute abnormalities. Electronically Signed   By: Ulyses Southward M.D.   On: 07/12/2020 20:20    Procedures Procedures   Medications Ordered in ED Medications  albuterol (VENTOLIN HFA) 108 (90 Base) MCG/ACT inhaler 4 puff (4 puffs Inhalation Given 07/12/20 2225)  predniSONE (DELTASONE) tablet 60 mg (60 mg Oral Given 07/12/20 2226)  ketorolac (TORADOL) injection 60 mg (60 mg Intramuscular Given 07/12/20 2226)  potassium chloride SA (KLOR-CON) CR tablet 40 mEq (40 mEq Oral Given 07/12/20 2225)    ED Course  I have reviewed the triage vital signs and the nursing notes.  Pertinent labs & imaging results that were available during my care of the patient were reviewed by me and considered in my medical decision making (see chart for details).    MDM Rules/Calculators/A&P                          60 year old male with history of COPD, coronary artery disease, hypertension, dilated cardiomyopathy, drug abuse  who presents with increasing shortness of breath and coughing as well as right chest wall pain which developed after coughing.  Differential diagnosis for dyspnea includes ACS, PE, COPD exacerbation, pneumothorax, CHF exacerbation, anemia, pneumonia, viral  etiology such as COVID 19 infection, metabolic abnormality.  Chest x-ray was done which showed no acute findings. EKG was evaluated by me which showed tachycardia wtihout change from previos.  No clinical findings to suggest CHF exacerbation. Denies CP, troponin 43 which is similar to prior values, doubt ACS.   No asymmetric leg swelling, history and exam most consistent with COPD exacerbation in response to seasonal allergies/pollen and given this have low suspicion for PE at this time.  Given steroids, bronchodilators for COPD exacerbation. Discussed possibility of occult rib fracture from coughing and recommend using spirometer, return if worsening.  Given rx for lidoderm patch, flexeril for pain, prednisone for COPD exacerbation and given inhaler. Patient discharged in stable condition with understanding of reasons to return.    Final Clinical Impression(s) / ED Diagnoses Final diagnoses:  COPD exacerbation (HCC)  Rib pain on right side    Rx / DC Orders ED Discharge Orders         Ordered    lidocaine (LIDODERM) 5 %  Every 24 hours        07/12/20 2203    predniSONE (DELTASONE) 10 MG tablet  Daily        07/12/20 2203    cyclobenzaprine (FLEXERIL) 10 MG tablet  2 times daily PRN        07/12/20 2203    lisinopril (ZESTRIL) 40 MG tablet  Daily        07/12/20 2203    naproxen (NAPROSYN) 500 MG tablet  2 times daily with meals        07/12/20 2226           Alvira Monday, MD 07/14/20 1521

## 2020-07-23 IMAGING — CR DG CHEST 2V
2 series · 2 of 2 positions shown · non-contrast
Comparison: 11/04/2018

CLINICAL DATA: Shortness of breath

EXAM:
CHEST - 2 VIEW

[w chest pa]
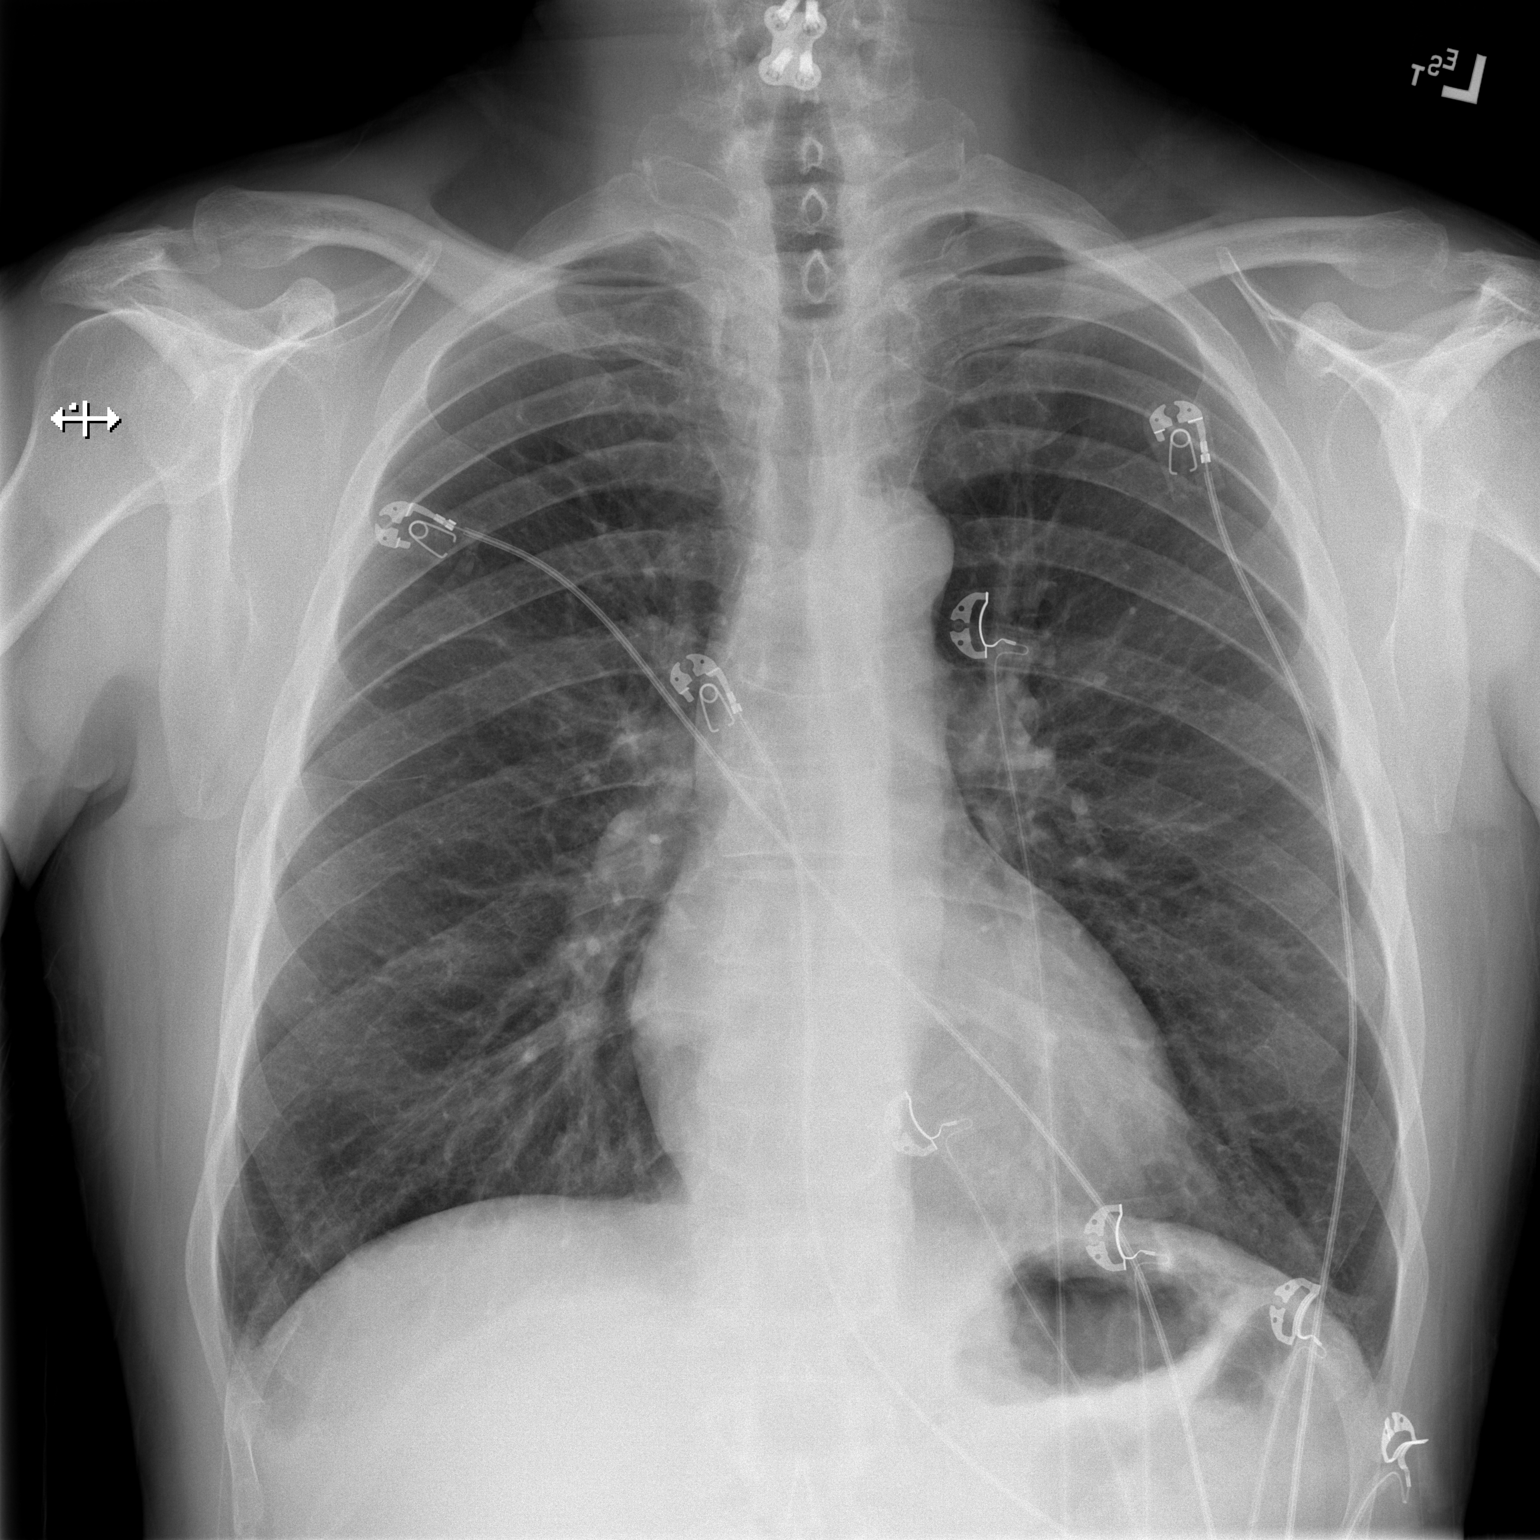

[w chest lat]
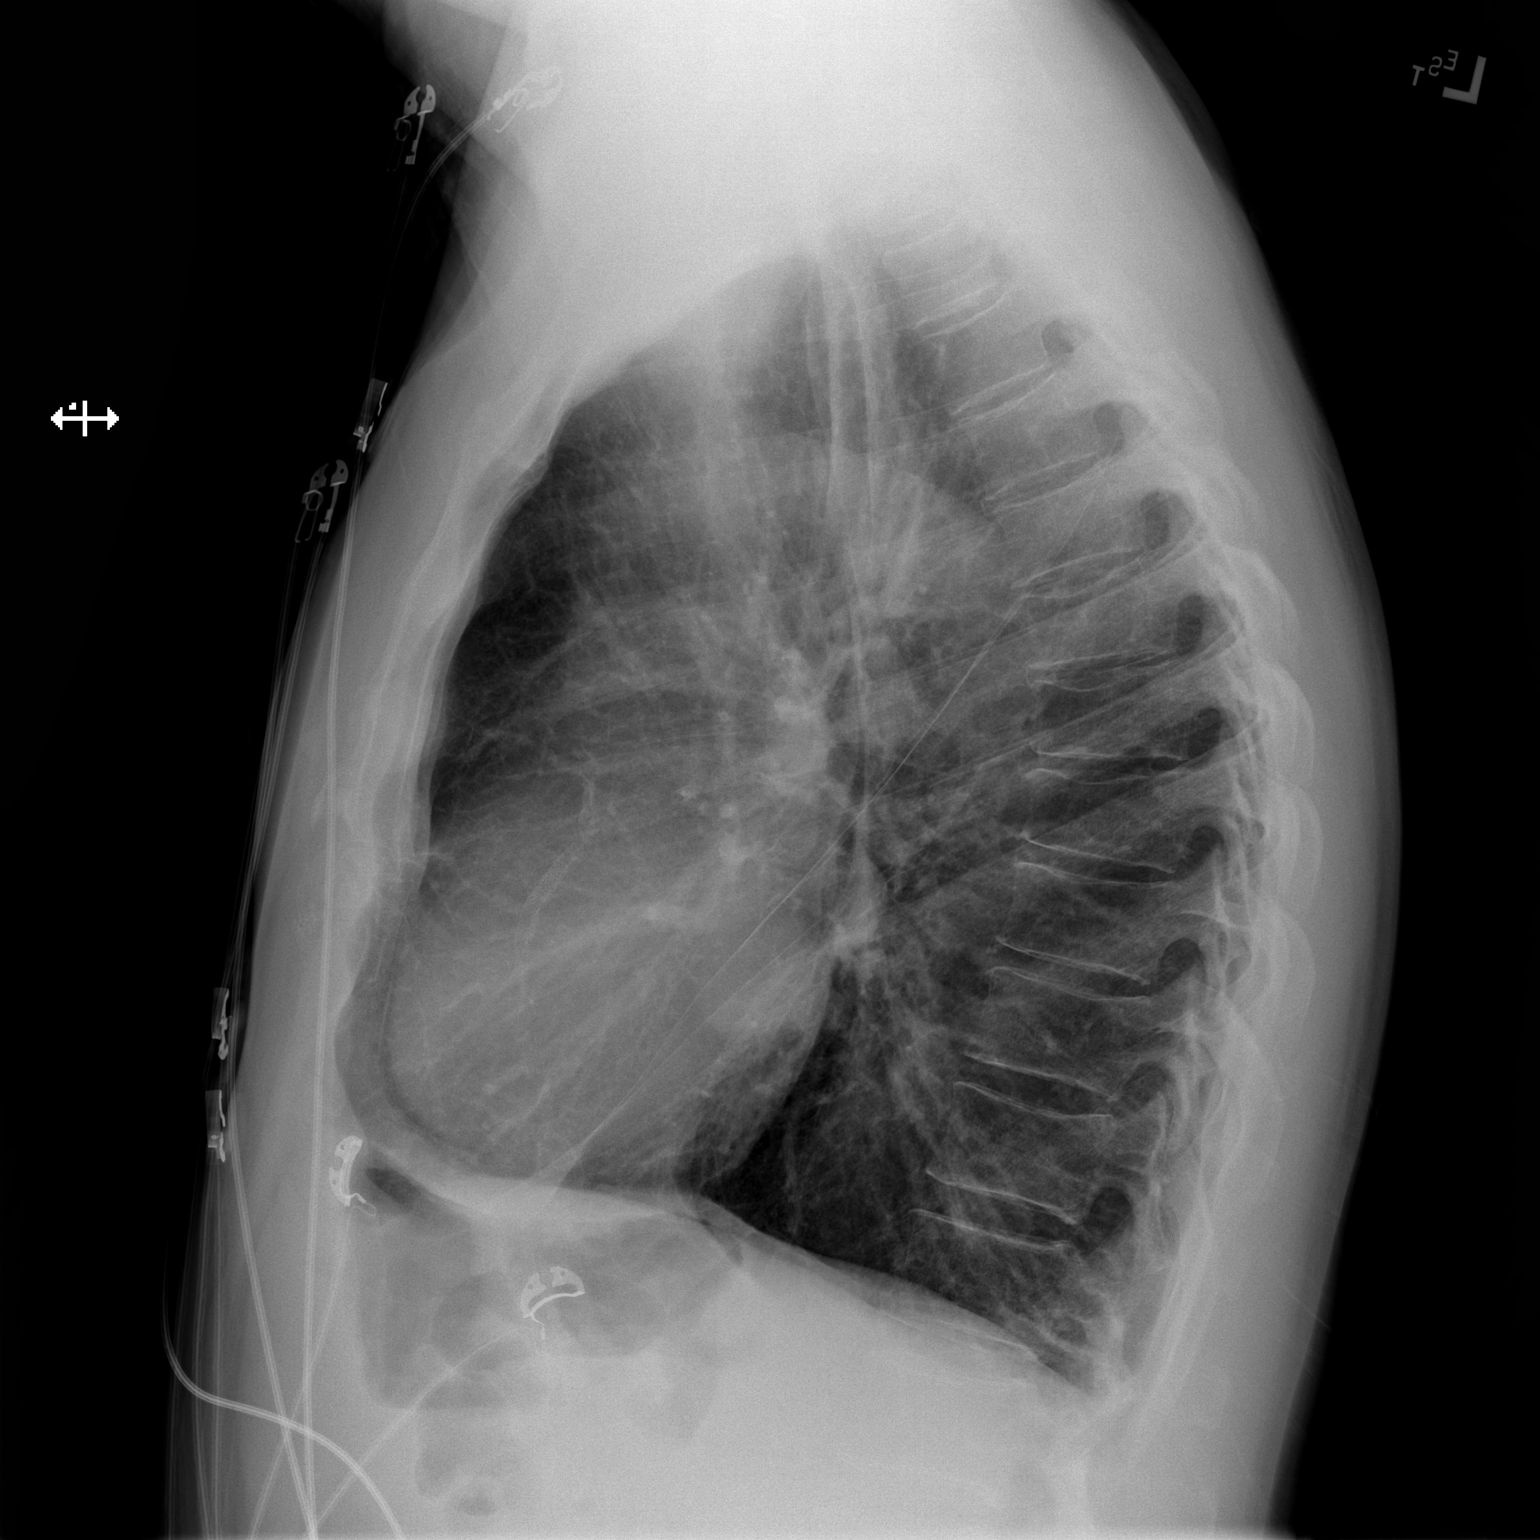

[2 of 2 positions shown; findings below may reference images not displayed]

FINDINGS: Cardiac shadow is stable. The lungs are hyperinflated similar to
that noted on the prior exam. No focal infiltrate or sizable
effusion is seen. Mild stable wedging is noted in the midthoracic
spine.
IMPRESSION: COPD without acute abnormality.

## 2020-08-02 ENCOUNTER — Emergency Department (HOSPITAL_COMMUNITY)
Admission: EM | Admit: 2020-08-02 | Discharge: 2020-08-02 | Disposition: A | Discharge status: Home / Self Care | Payer: Self-pay | Attending: Emergency Medicine | Admitting: Emergency Medicine

## 2020-08-02 ENCOUNTER — Other Ambulatory Visit: Payer: Self-pay

## 2020-08-02 ENCOUNTER — Emergency Department (HOSPITAL_COMMUNITY): Payer: Self-pay

## 2020-08-02 ENCOUNTER — Encounter (HOSPITAL_COMMUNITY): Payer: Self-pay | Admitting: Emergency Medicine

## 2020-08-02 DIAGNOSIS — Z7982 Long term (current) use of aspirin: Secondary | ICD-10-CM | POA: Insufficient documentation

## 2020-08-02 DIAGNOSIS — Z20822 Contact with and (suspected) exposure to covid-19: Secondary | ICD-10-CM | POA: Insufficient documentation

## 2020-08-02 DIAGNOSIS — I5043 Acute on chronic combined systolic (congestive) and diastolic (congestive) heart failure: Secondary | ICD-10-CM | POA: Insufficient documentation

## 2020-08-02 DIAGNOSIS — F17210 Nicotine dependence, cigarettes, uncomplicated: Secondary | ICD-10-CM | POA: Insufficient documentation

## 2020-08-02 DIAGNOSIS — J441 Chronic obstructive pulmonary disease with (acute) exacerbation: Secondary | ICD-10-CM | POA: Insufficient documentation

## 2020-08-02 DIAGNOSIS — Z79899 Other long term (current) drug therapy: Secondary | ICD-10-CM | POA: Insufficient documentation

## 2020-08-02 DIAGNOSIS — I110 Hypertensive heart disease with heart failure: Secondary | ICD-10-CM | POA: Insufficient documentation

## 2020-08-02 DIAGNOSIS — I2510 Atherosclerotic heart disease of native coronary artery without angina pectoris: Secondary | ICD-10-CM | POA: Insufficient documentation

## 2020-08-02 HISTORY — DX: Heart failure, unspecified: I50.9

## 2020-08-02 LAB — BASIC METABOLIC PANEL
Anion gap: 8 (ref 5–15)
BUN: 10 mg/dL (ref 6–20)
CO2: 32 mmol/L (ref 22–32)
Calcium: 9.6 mg/dL (ref 8.9–10.3)
Chloride: 99 mmol/L (ref 98–111)
Creatinine, Ser: 0.92 mg/dL (ref 0.61–1.24)
GFR, Estimated: >60 mL/min (ref >60)
Glucose, Bld: 83 mg/dL (ref 70–99)
Potassium: 4.1 mmol/L (ref 3.5–5.1)
Sodium: 139 mmol/L (ref 135–145)

## 2020-08-02 LAB — CBC WITH DIFFERENTIAL/PLATELET
Abs Immature Granulocytes: 0.04 10*3/uL (ref 0.00–0.07)
Basophils Absolute: 0.1 10*3/uL (ref 0.0–0.1)
Basophils Relative: 1 %
Eosinophils Absolute: 0.1 10*3/uL (ref 0.0–0.5)
Eosinophils Relative: 1 %
HCT: 45.9 % (ref 39.0–52.0)
Hemoglobin: 15.2 g/dL (ref 13.0–17.0)
Immature Granulocytes: 0 %
Lymphocytes Relative: 18 %
Lymphs Abs: 2.1 10*3/uL (ref 0.7–4.0)
MCH: 32 pg (ref 26.0–34.0)
MCHC: 33.1 g/dL (ref 30.0–36.0)
MCV: 96.6 fL (ref 80.0–100.0)
Monocytes Absolute: 1.3 10*3/uL — ABNORMAL HIGH (ref 0.1–1.0)
Monocytes Relative: 12 %
Neutro Abs: 7.9 10*3/uL — ABNORMAL HIGH (ref 1.7–7.7)
Neutrophils Relative %: 68 %
Platelets: 248 10*3/uL (ref 150–400)
RBC: 4.75 MIL/uL (ref 4.22–5.81)
RDW: 13.1 % (ref 11.5–15.5)
WBC: 11.6 10*3/uL — ABNORMAL HIGH (ref 4.0–10.5)
nRBC: 0 % (ref 0.0–0.2)

## 2020-08-02 LAB — RESP PANEL BY RT-PCR (FLU A&B, COVID) ARPGX2
Influenza A by PCR: NEGATIVE
Influenza B by PCR: NEGATIVE
SARS Coronavirus 2 by RT PCR: NEGATIVE

## 2020-08-02 LAB — TROPONIN I (HIGH SENSITIVITY)
Troponin I (High Sensitivity): 72 ng/L — ABNORMAL HIGH (ref <18)
Troponin I (High Sensitivity): 74 ng/L — ABNORMAL HIGH (ref <18)

## 2020-08-02 LAB — BRAIN NATRIURETIC PEPTIDE: B Natriuretic Peptide: 1204.8 pg/mL — ABNORMAL HIGH (ref 0.0–100.0)

## 2020-08-02 MED ORDER — PREDNISONE 50 MG PO TABS: ORAL_TABLET | ORAL | 0 refills | Status: DC

## 2020-08-02 MED ORDER — PREDNISONE 20 MG PO TABS
60.0000 mg | ORAL_TABLET | Freq: Once | ORAL | Status: AC
  Administered 2020-08-02: 60 mg via ORAL
  Filled 2020-08-02: qty 3

## 2020-08-02 MED ORDER — IPRATROPIUM-ALBUTEROL 0.5-2.5 (3) MG/3ML IN SOLN
3.0000 mL | Freq: Once | RESPIRATORY_TRACT | Status: AC
  Administered 2020-08-02: 3 mL via RESPIRATORY_TRACT
  Filled 2020-08-02: qty 3

## 2020-08-02 MED ORDER — NITROGLYCERIN 2 % TD OINT
1.0000 [in_us] | TOPICAL_OINTMENT | Freq: Once | TRANSDERMAL | Status: AC
  Administered 2020-08-02: 1 [in_us] via TOPICAL
  Filled 2020-08-02: qty 1

## 2020-08-02 MED ORDER — FUROSEMIDE 10 MG/ML IJ SOLN
40.0000 mg | Freq: Once | INTRAMUSCULAR | Status: AC
  Administered 2020-08-02: 40 mg via INTRAVENOUS
  Filled 2020-08-02: qty 4

## 2020-08-02 MED ORDER — ALBUTEROL SULFATE HFA 108 (90 BASE) MCG/ACT IN AERS
2.0000 | INHALATION_SPRAY | Freq: Once | RESPIRATORY_TRACT | Status: AC
  Administered 2020-08-02: 2 via RESPIRATORY_TRACT
  Filled 2020-08-02: qty 6.7

## 2020-08-02 NOTE — ED Triage Notes (Signed)
Patient reports mid/left chest pain onset yesterday with worsening SOB , no emesis or diaphoresis , history of CHF/COPD. No fever or cough.

## 2020-08-02 NOTE — ED Provider Notes (Signed)
[facility] EMERGENCY DEPARTMENT Provider Note   CSN: [id number] Arrival date & time: 08/02/20  0012     History Chief Complaint  Patient presents with  . Chest pain /SOB/COPD-CHF    Francisco [name] is a 60 y.o. male.  The history is provided by the patient.  Shortness of Breath Severity:  Moderate Onset quality:  Gradual Duration: several days. Timing:  Constant Progression:  Worsening Chronicity:  Recurrent Relieved by:  Nothing Worsened by:  Exertion Associated symptoms: chest pain and cough   Associated symptoms: no abdominal pain, no diaphoresis, no fever, no hemoptysis and no vomiting   Associated symptoms comment:  Chest tightness  Patient with history of COPD, CHF, CAD presents with shortness of breath.  Patient reports increasing shortness of breath of the past several days.  He also reports nonproductive cough.  No fevers or vomiting.  Patient is unsure if this is COPD or CHF.  He reports he is a smoker     Past Medical History:  Diagnosis Date  . CHF (congestive heart failure) (HCC)   . COPD (chronic obstructive pulmonary disease) (HCC)   . Coronary artery disease    a. s/p prior PCI.  Marland Kitchen Hepatitis-C   . Hypertension   . IV drug abuse (HCC)    a. 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  . Medically noncompliant   . MI (myocardial infarction) (HCC)   . Tobacco abuse     Patient Active Problem List   Diagnosis Date Noted  . Acute on chronic respiratory failure with hypoxia and hypercapnia (HCC) 01/10/2020  . COPD exacerbation (HCC) 01/09/2020  . Hypotension 08/18/2019  . Acute on chronic combined systolic (congestive) and diastolic (congestive) heart failure (HCC) 08/17/2019  . 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  . Chronic obstructive pulmonary disease (HCC)   . Elevated troponin   . Unstable angina (HCC)   . Dilated cardiomyopathy (HCC)   . CAD S/P percutaneous coronary angioplasty   . ACS (acute coronary syndrome) (HCC)  04/29/2019  . Acute respiratory failure with hypoxia (HCC) 06/17/2018    Past Surgical History:  Procedure Laterality Date  . BACK SURGERY    . NECK SURGERY    . RIGHT/LEFT HEART CATH AND CORONARY ANGIOGRAPHY N/A 04/30/2019   Procedure: RIGHT/LEFT HEART CATH AND CORONARY ANGIOGRAPHY;  Surgeon: Marykay Lex, MD;  Location: [facility] INVASIVE CV LAB;  Service: Cardiovascular;  Laterality: N/A;  . stints         Family History  Problem Relation Age of Onset  . Rheum arthritis Mother     Social History   Tobacco Use  . Smoking status: Current Every Day Smoker    Packs/day: 0.75    Types: Cigarettes  . Smokeless tobacco: Never Used  Vaping Use  . Vaping Use: Never used  Substance Use Topics  . Alcohol use: Never  . Drug use: Yes    Types: Methamphetamines    Comment: heroin    Home Medications Prior to Admission medications   Medication Sig Start Date End Date Taking? Authorizing Provider  albuterol (PROVENTIL) (2.5 MG/3ML) 0.083% nebulizer solution Take 3 mLs (2.5 mg total) by nebulization every 6 (six) hours as needed for wheezing or shortness of breath. 05/10/20   Pollina, Canary Brim, MD  albuterol (VENTOLIN HFA) 108 (90 Base) MCG/ACT inhaler Inhale 1-2 puffs into the lungs every 6 (six) hours as needed for wheezing or shortness of breath. 05/06/20   Lorelee New, PA-C  aspirin EC 81 MG tablet Take 1  tablet (81 mg total) by mouth daily. 01/04/20   Tilden Fossa, MD  atorvastatin (LIPITOR) 80 MG tablet Take 1 tablet (80 mg total) by mouth daily at 6 PM. 05/06/20 06/05/20  Chilton Si, Sharion Settler, PA-C  azithromycin (ZITHROMAX Z-PAK) 250 MG tablet 500 mg PO Day 1, 250 mg PO Day 2-5 05/06/20   Chilton Si, Sharion Settler, PA-C  carvedilol (COREG) 25 MG tablet Take 1 tablet (25 mg total) by mouth 2 (two) times daily with a meal. 05/06/20 06/05/20  Lorelee New, PA-C  cyclobenzaprine (FLEXERIL) 10 MG tablet Take 1 tablet (10 mg total) by mouth 2 (two) times daily as needed for muscle spasms.  07/12/20   Alvira Monday, MD  furosemide (LASIX) 40 MG tablet Take 1 tablet (40 mg total) by mouth daily. 01/17/20 02/16/20  Anders Simmonds, PA-C  ibuprofen (ADVIL) 200 MG tablet Take 400 mg by mouth every 6 (six) hours as needed for moderate pain.    [provider]  ipratropium-albuterol (DUONEB) 0.5-2.5 (3) MG/3ML SOLN TAKE 3 MLS BY NEBULIZATION 3 (THREE) TIMES DAILY. Patient taking differently: Take 3 mLs by nebulization 3 (three) times daily as needed (for shortness of breath or wheezing). 03/11/20 04/10/20  Anders Simmonds, PA-C  lidocaine (LIDODERM) 5 % Place 1 patch onto the skin daily. Remove & Discard patch within 12 hours or as directed by MD 07/12/20   Alvira Monday, MD  lisinopril (ZESTRIL) 40 MG tablet Take 1 tablet (40 mg total) by mouth daily. 07/12/20   Alvira Monday, MD  mometasone-formoterol (DULERA) 200-5 MCG/ACT AERO Inhale 2 puffs into the lungs 2 (two) times daily. Patient not taking: No sig reported 05/06/20 06/05/20  Lorelee New, PA-C  naproxen (NAPROSYN) 500 MG tablet Take 1 tablet (500 mg total) by mouth 2 (two) times daily with a meal. 07/12/20   Alvira Monday, MD  nitroGLYCERIN (NITROSTAT) 0.4 MG SL tablet Place 1 tablet (0.4 mg total) under the tongue every 5 (five) minutes x 3 doses as needed for chest pain. 08/20/19   Kendell Bane, MD    Allergies    Ivp dye [iodinated diagnostic agents] and Tramadol  Review of Systems   Review of Systems  Constitutional: Negative for diaphoresis and fever.  Respiratory: Positive for cough and shortness of breath. Negative for hemoptysis.   Cardiovascular: Positive for chest pain and leg swelling.  Gastrointestinal: Negative for abdominal pain and vomiting.  All other systems reviewed and are negative.   Physical Exam Updated Vital Signs BP (!) 168/107   Pulse (!) 114   Temp (!) 97.4 F (36.3 C) (Oral)   Resp (!) 22   Ht 1.829 m (6')   Wt 90 kg   SpO2 95%   BMI 26.91 kg/m   Physical  Exam CONSTITUTIONAL: Chronically ill HEAD: Normocephalic/atraumatic EYES: EOMI/PERRL ENMT: Mucous membranes moist NECK: supple no meningeal signs SPINE/BACK:entire spine nontender CV: S1/S2 noted, no murmurs/rubs/gallops noted LUNGS: Tachypneic and wheezing bilaterally ABDOMEN: soft, nontender, no rebound or guarding, bowel sounds noted throughout abdomen GU:no cva tenderness NEURO: Pt is awake/alert/appropriate, moves all extremitiesx4.  No facial droop.   EXTREMITIES: pulses normal/equal, full ROM, pitting edema noted bilateral lower extremities SKIN: warm, color normal PSYCH: no abnormalities of mood noted, alert and oriented to situation  ED Results / Procedures / Treatments   Labs (all labs ordered are listed, but only abnormal results are displayed) Labs Reviewed  CBC WITH DIFFERENTIAL/PLATELET - Abnormal; Notable for the following components:      Result Value  WBC 11.6 (*)    Neutro Abs 7.9 (*)    Monocytes Absolute 1.3 (*)    All other components within normal limits  BRAIN NATRIURETIC PEPTIDE - Abnormal; Notable for the following components:   B Natriuretic Peptide 1,204.8 (*)    All other components within normal limits  TROPONIN I (HIGH SENSITIVITY) - Abnormal; Notable for the following components:   Troponin I (High Sensitivity) 72 (*)    All other components within normal limits  TROPONIN I (HIGH SENSITIVITY) - Abnormal; Notable for the following components:   Troponin I (High Sensitivity) 74 (*)    All other components within normal limits  RESP PANEL BY RT-PCR (FLU A&B, COVID) [id number]  BASIC METABOLIC PANEL    EKG EKG Interpretation  Date/Time:  Saturday August 02 2020 00:59:25 EDT Ventricular Rate:  112 PR Interval:  126 QRS Duration: 84 QT Interval:  342 QTC Calculation: 466 R Axis:   70 Text Interpretation: Sinus tachycardia Right atrial enlargement ST & T wave abnormality, consider inferolateral ischemia Abnormal ECG Interpretation limited secondary  to artifact Confirmed by Zadie Rhine ([id number]) on 08/02/2020 1:47:34 AM   Radiology DG Chest 2 View  Result Date: 08/02/2020 CLINICAL DATA:  Shortness of breath, history of CHF, COPD EXAM: CHEST - 2 VIEW COMPARISON:  Radiograph 07/12/2020, CT 11/04/2018 FINDINGS: Chronic hyperinflation and coarsened interstitial changes are similar to priors. No consolidation, features of edema, pneumothorax, or effusion. The cardiomediastinal contours are unremarkable. No acute osseous or soft tissue abnormality. Prior cervical fusion, incompletely assessed on this exam. IMPRESSION: Chronic hyperinflation and coarsened interstitial changes compatible with history of COPD. No acute cardiopulmonary findings. Electronically Signed   By: Kreg Shropshire M.D.   On: 08/02/2020 01:11    Procedures Procedures   Medications Ordered in ED Medications  nitroGLYCERIN (NITROGLYN) 2 % ointment 1 inch (1 inch Topical Given 08/02/20 0252)  furosemide (LASIX) injection 40 mg (40 mg Intravenous Given 08/02/20 0252)  albuterol (VENTOLIN HFA) 108 (90 Base) MCG/ACT inhaler 2 puff (2 puffs Inhalation Given 08/02/20 0252)  ipratropium-albuterol (DUONEB) 0.5-2.5 (3) MG/3ML nebulizer solution 3 mL (3 mLs Nebulization Given 08/02/20 0541)  predniSONE (DELTASONE) tablet 60 mg (60 mg Oral Given 08/02/20 3254)    ED Course  I have reviewed the triage vital signs and the nursing notes.  Pertinent labs & imaging results that were available during my care of the patient were reviewed by me and considered in my medical decision making (see chart for details).    MDM Rules/Calculators/A&P                          History of COPD, CHF, noncompliance presents with shortness of breath.  He had mostly wheezing on exam.  He is now feeling improved.  Will give a DuoNeb and prednisone.  Troponins are chronically elevated, and currently flat.  Suspect mostly COPD exacerbation.  Chest x-ray is negative for pulmonary edema 6:27 AM Patient is much  improved.  Improved work of breathing.  He is ambulatory.  He feels comfortable for discharge home. Suspect COPD exacerbation due to noncompliance and smoking  Final Clinical Impression(s) / ED Diagnoses Final diagnoses:  Chronic obstructive pulmonary disease with acute exacerbation (HCC)    Rx / DC Orders ED Discharge Orders         Ordered    predniSONE (DELTASONE) 50 MG tablet        08/02/20 9826  Zadie Rhine, MD 08/02/20 [phone]

## 2020-08-02 NOTE — ED Notes (Signed)
Patient ambulates well.

## 2020-08-02 NOTE — ED Provider Notes (Signed)
MSE was initiated and I personally evaluated the patient and placed orders (if any) at  12:41 AM on August 02, 2020.  Patient here with SOB.  States that it feels like his CHF.  Reports having increased fluid on his legs.  States that he is out of his meds except for his inhaler.  Denies fever.  Denies productive cough.  Discussed with patient that their care has been initiated.   They are counseled that they will need to remain in the ED until the completion of their workup, including full H&P and results of any tests.  Risks of leaving the emergency department prior to completion of treatment were discussed. Patient was advised to inform ED staff if they are leaving before their treatment is complete. The patient acknowledged these risks and time was allowed for questions.    The patient appears stable so that the remainder of the MSE may be completed by another provider.    Kenner, Lewan, PA-C 08/02/20 4128    Zadie Rhine, MD 08/02/20 [phone]

## 2020-08-06 ENCOUNTER — Emergency Department (HOSPITAL_COMMUNITY): Payer: Self-pay

## 2020-08-06 ENCOUNTER — Encounter (HOSPITAL_COMMUNITY): Payer: Self-pay | Admitting: Emergency Medicine

## 2020-08-06 ENCOUNTER — Emergency Department (HOSPITAL_COMMUNITY)
Admission: EM | Admit: 2020-08-06 | Discharge: 2020-08-06 | Discharge status: Against Medical Advice | Payer: Self-pay | Attending: Emergency Medicine | Admitting: Emergency Medicine

## 2020-08-06 ENCOUNTER — Other Ambulatory Visit: Payer: Self-pay

## 2020-08-06 DIAGNOSIS — Z79899 Other long term (current) drug therapy: Secondary | ICD-10-CM | POA: Insufficient documentation

## 2020-08-06 DIAGNOSIS — I214 Non-ST elevation (NSTEMI) myocardial infarction: Secondary | ICD-10-CM | POA: Insufficient documentation

## 2020-08-06 DIAGNOSIS — I5043 Acute on chronic combined systolic (congestive) and diastolic (congestive) heart failure: Secondary | ICD-10-CM | POA: Insufficient documentation

## 2020-08-06 DIAGNOSIS — I2510 Atherosclerotic heart disease of native coronary artery without angina pectoris: Secondary | ICD-10-CM | POA: Insufficient documentation

## 2020-08-06 DIAGNOSIS — Z7982 Long term (current) use of aspirin: Secondary | ICD-10-CM | POA: Insufficient documentation

## 2020-08-06 DIAGNOSIS — F17210 Nicotine dependence, cigarettes, uncomplicated: Secondary | ICD-10-CM | POA: Insufficient documentation

## 2020-08-06 DIAGNOSIS — I509 Heart failure, unspecified: Secondary | ICD-10-CM

## 2020-08-06 DIAGNOSIS — I110 Hypertensive heart disease with heart failure: Secondary | ICD-10-CM | POA: Insufficient documentation

## 2020-08-06 DIAGNOSIS — J449 Chronic obstructive pulmonary disease, unspecified: Secondary | ICD-10-CM | POA: Insufficient documentation

## 2020-08-06 LAB — I-STAT VENOUS BLOOD GAS, ED
Acid-Base Excess: 7 mmol/L — ABNORMAL HIGH (ref 0.0–2.0)
Bicarbonate: 29.5 mmol/L — ABNORMAL HIGH (ref 20.0–28.0)
Calcium, Ion: 0.91 mmol/L — ABNORMAL LOW (ref 1.15–1.40)
HCT: 44 % (ref 39.0–52.0)
Hemoglobin: 15 g/dL (ref 13.0–17.0)
O2 Saturation: 91 %
Potassium: 3.1 mmol/L — ABNORMAL LOW (ref 3.5–5.1)
Sodium: 138 mmol/L (ref 135–145)
TCO2: 31 mmol/L (ref 22–32)
pCO2, Ven: 34.9 mmHg — ABNORMAL LOW (ref 44.0–60.0)
pH, Ven: 7.535 — ABNORMAL HIGH (ref 7.250–7.430)
pO2, Ven: 54 mmHg — ABNORMAL HIGH (ref 32.0–45.0)

## 2020-08-06 LAB — COMPREHENSIVE METABOLIC PANEL
ALT: 48 U/L — ABNORMAL HIGH (ref 0–44)
AST: 26 U/L (ref 15–41)
Albumin: 3.4 g/dL — ABNORMAL LOW (ref 3.5–5.0)
Alkaline Phosphatase: 67 U/L (ref 38–126)
Anion gap: 12 (ref 5–15)
BUN: 18 mg/dL (ref 6–20)
CO2: 27 mmol/L (ref 22–32)
Calcium: 9 mg/dL (ref 8.9–10.3)
Chloride: 101 mmol/L (ref 98–111)
Creatinine, Ser: 1.05 mg/dL (ref 0.61–1.24)
GFR, Estimated: >60 mL/min (ref >60)
Glucose, Bld: 91 mg/dL (ref 70–99)
Potassium: 3.3 mmol/L — ABNORMAL LOW (ref 3.5–5.1)
Sodium: 140 mmol/L (ref 135–145)
Total Bilirubin: 0.6 mg/dL (ref 0.3–1.2)
Total Protein: 6.4 g/dL — ABNORMAL LOW (ref 6.5–8.1)

## 2020-08-06 LAB — CBC WITH DIFFERENTIAL/PLATELET
Abs Immature Granulocytes: 0.13 10*3/uL — ABNORMAL HIGH (ref 0.00–0.07)
Basophils Absolute: 0.1 10*3/uL (ref 0.0–0.1)
Basophils Relative: 0 %
Eosinophils Absolute: 0 10*3/uL (ref 0.0–0.5)
Eosinophils Relative: 0 %
HCT: 44.7 % (ref 39.0–52.0)
Hemoglobin: 14.6 g/dL (ref 13.0–17.0)
Immature Granulocytes: 1 %
Lymphocytes Relative: 21 %
Lymphs Abs: 3.4 10*3/uL (ref 0.7–4.0)
MCH: 32.4 pg (ref 26.0–34.0)
MCHC: 32.7 g/dL (ref 30.0–36.0)
MCV: 99.1 fL (ref 80.0–100.0)
Monocytes Absolute: 1.8 10*3/uL — ABNORMAL HIGH (ref 0.1–1.0)
Monocytes Relative: 11 %
Neutro Abs: 10.6 10*3/uL — ABNORMAL HIGH (ref 1.7–7.7)
Neutrophils Relative %: 67 %
Platelets: 310 10*3/uL (ref 150–400)
RBC: 4.51 MIL/uL (ref 4.22–5.81)
RDW: 13.5 % (ref 11.5–15.5)
WBC: 15.9 10*3/uL — ABNORMAL HIGH (ref 4.0–10.5)
nRBC: 0 % (ref 0.0–0.2)

## 2020-08-06 LAB — BRAIN NATRIURETIC PEPTIDE: B Natriuretic Peptide: 940.8 pg/mL — ABNORMAL HIGH (ref 0.0–100.0)

## 2020-08-06 LAB — TROPONIN I (HIGH SENSITIVITY)
Troponin I (High Sensitivity): 109 ng/L (ref <18)
Troponin I (High Sensitivity): 124 ng/L (ref <18)

## 2020-08-06 LAB — RAPID URINE DRUG SCREEN, HOSP PERFORMED
Amphetamines: POSITIVE — AB
Barbiturates: NOT DETECTED
Benzodiazepines: NOT DETECTED
Cocaine: NOT DETECTED
Opiates: NOT DETECTED
Tetrahydrocannabinol: NOT DETECTED

## 2020-08-06 MED ORDER — IPRATROPIUM-ALBUTEROL 0.5-2.5 (3) MG/3ML IN SOLN
3.0000 mL | Freq: Once | RESPIRATORY_TRACT | Status: AC
  Administered 2020-08-06: 3 mL via RESPIRATORY_TRACT
  Filled 2020-08-06: qty 3

## 2020-08-06 MED ORDER — FUROSEMIDE 10 MG/ML IJ SOLN
40.0000 mg | INTRAMUSCULAR | Status: AC
  Administered 2020-08-06: 40 mg via INTRAVENOUS
  Filled 2020-08-06: qty 4

## 2020-08-06 MED ORDER — NITROGLYCERIN 2 % TD OINT
1.0000 [in_us] | TOPICAL_OINTMENT | Freq: Once | TRANSDERMAL | Status: AC
  Administered 2020-08-06: 1 [in_us] via TOPICAL
  Filled 2020-08-06: qty 1

## 2020-08-06 MED ORDER — LISINOPRIL 20 MG PO TABS: 20.0000 mg | ORAL_TABLET | Freq: Every day | ORAL | 0 refills | Status: DC

## 2020-08-06 NOTE — ED Provider Notes (Signed)
[facility] EMERGENCY DEPARTMENT Provider Note   CSN: [id number] Arrival date & time: 08/06/20  5176     History Chief Complaint  Patient presents with  . Shortness of Breath    Francisco [name] is a 60 y.o. male.  60yo M w/ PMH below including COPD, CHF, Hep C, HTN, CAD s/p stenting, substance abuse, medication non-compliance who p/w SOB.  Patient reported to EMS that he woke up around 2 AM with severe shortness of breath and chest heaviness.  He was evaluated in the ED for shortness of breath 4 days ago and given breathing treatments, steroids, and Lasix.  He has been taking prednisone and Lasix daily at home and states that his symptoms seem to be improving but got worse last night.  EMS notes that he was in respiratory distress on their arrival with diminished breath sounds.  He received a total of 5 mg of albuterol, 0.5 mg Atrovent, 324mg  ASA, and NTG x 2 in route w/ improvement in WOB. He refused CPAP. Pt states he has chest heaviness like someone sitting on his chest when his SOB is worse; chest pain improves when breathing improves. He endorses orthopnea, PND, and increased LE edema. He does not weigh himself at home. He reports he's been off all other meds for the past 1 month because he ran out of them. He reports dry cough, no fevers, vomiting, or abdominal pain.   The history is provided by the patient and the EMS personnel.  Shortness of Breath      Past Medical History:  Diagnosis Date  . CHF (congestive heart failure) (HCC)   . COPD (chronic obstructive pulmonary disease) (HCC)   . Coronary artery disease    a. s/p prior PCI.  Hepatitis-C   . Hypertension   . IV drug abuse (HCC)    a. 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  . Medically noncompliant   . MI (myocardial infarction) (HCC)   . Tobacco abuse     Patient Active Problem List   Diagnosis Date Noted  . Acute on chronic respiratory failure with hypoxia and hypercapnia (HCC)  01/10/2020  . COPD exacerbation (HCC) 01/09/2020  . Hypotension 08/18/2019  . Acute on chronic combined systolic (congestive) and diastolic (congestive) heart failure (HCC) 08/17/2019  . 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  . Chronic obstructive pulmonary disease (HCC)   . Elevated troponin   . Unstable angina (HCC)   . Dilated cardiomyopathy (HCC)   . CAD S/P percutaneous coronary angioplasty   . ACS (acute coronary syndrome) (HCC) 04/29/2019  . Acute respiratory failure with hypoxia (HCC) 06/17/2018    Past Surgical History:  Procedure Laterality Date  . BACK SURGERY    . NECK SURGERY    . RIGHT/LEFT HEART CATH AND CORONARY ANGIOGRAPHY N/A 04/30/2019   Procedure: RIGHT/LEFT HEART CATH AND CORONARY ANGIOGRAPHY;  Surgeon: 06/28/2019, MD;  Location: [facility] INVASIVE CV LAB;  Service: Cardiovascular;  Laterality: N/A;  . stints         Family History  Problem Relation Age of Onset  . Rheum arthritis Mother     Social History   Tobacco Use  . Smoking status: Current Every Day Smoker    Packs/day: 0.75    Types: Cigarettes  . Smokeless tobacco: Never Used  Vaping Use  . Vaping Use: Never used  Substance Use Topics  . Alcohol use: Never  . Drug use: Yes    Types: Methamphetamines    Comment: heroin  Home Medications Prior to Admission medications   Medication Sig Start Date End Date Taking? Authorizing Provider  albuterol (PROVENTIL) (2.5 MG/3ML) 0.083% nebulizer solution Take 3 mLs (2.5 mg total) by nebulization every 6 (six) hours as needed for wheezing or shortness of breath. 05/10/20  Yes Pollina, Canary Brim, MD  albuterol (VENTOLIN HFA) 108 (90 Base) MCG/ACT inhaler Inhale 1-2 puffs into the lungs every 6 (six) hours as needed for wheezing or shortness of breath. 05/06/20  Yes Lorelee New, PA-C  aspirin EC 81 MG tablet Take 1 tablet (81 mg total) by mouth daily. 01/04/20  Yes Tilden Fossa, MD  furosemide (LASIX) 40 MG tablet Take 1 tablet (40 mg total)  by mouth daily. 01/17/20 02/16/20 Yes McClung, Marzella Schlein, PA-C  ibuprofen (ADVIL) 200 MG tablet Take 400 mg by mouth every 6 (six) hours as needed for moderate pain.   Yes [provider]  lisinopril (ZESTRIL) 20 MG tablet Take 1 tablet (20 mg total) by mouth daily. 08/06/20  Yes Tanvi Gatling, Ambrose Finland, MD  nitroGLYCERIN (NITROSTAT) 0.4 MG SL tablet Place 1 tablet (0.4 mg total) under the tongue every 5 (five) minutes x 3 doses as needed for chest pain. 08/20/19  Yes Shahmehdi, Seyed A, MD    Allergies    Ivp dye [iodinated diagnostic agents] and Tramadol  Review of Systems   Review of Systems  Respiratory: Positive for shortness of breath.    All other systems reviewed and are negative except that which was mentioned in HPI  Physical Exam Updated Vital Signs BP (!) 167/111   Pulse (!) 120   Temp 98.5 F (36.9 C) (Oral)   Resp 20   Ht 6' (1.829 m)   Wt 81.6 kg   SpO2 94%   BMI 24.41 kg/m   Physical Exam Constitutional:      General: He is not in acute distress.    Appearance: Normal appearance.  HENT:     Head: Normocephalic and atraumatic.  Eyes:     Conjunctiva/sclera: Conjunctivae normal.  Cardiovascular:     Rate and Rhythm: Regular rhythm. Tachycardia present.     Heart sounds: Normal heart sounds. No murmur heard.   Pulmonary:     Effort: Pulmonary effort is normal. Tachypnea present.     Comments: tachypnea with mildly increased WOB, diminished BS b/l with faint crackles, no significant wheezing Abdominal:     General: Abdomen is flat. Bowel sounds are normal. There is no distension.     Palpations: Abdomen is soft.     Tenderness: There is no abdominal tenderness.  Musculoskeletal:     Right lower leg: Edema present.     Left lower leg: Edema present.  Skin:    General: Skin is warm and dry.  Neurological:     Mental Status: He is alert and oriented to person, place, and time.     Comments: fluent  Psychiatric:        Mood and Affect: Mood normal.         Behavior: Behavior normal.     ED Results / Procedures / Treatments   Labs (all labs ordered are listed, but only abnormal results are displayed) Labs Reviewed  COMPREHENSIVE METABOLIC PANEL - Abnormal; Notable for the following components:      Result Value   Potassium 3.3 (*)    Total Protein 6.4 (*)    Albumin 3.4 (*)    ALT 48 (*)    All other components within normal limits  BRAIN  NATRIURETIC PEPTIDE - Abnormal; Notable for the following components:   B Natriuretic Peptide 940.8 (*)    All other components within normal limits  CBC WITH DIFFERENTIAL/PLATELET - Abnormal; Notable for the following components:   WBC 15.9 (*)    Neutro Abs 10.6 (*)    Monocytes Absolute 1.8 (*)    Abs Immature Granulocytes 0.13 (*)    All other components within normal limits  RAPID URINE DRUG SCREEN, HOSP PERFORMED - Abnormal; Notable for the following components:   Amphetamines POSITIVE (*)    All other components within normal limits  I-STAT VENOUS BLOOD GAS, ED - Abnormal; Notable for the following components:   pH, Ven 7.535 (*)    pCO2, Ven 34.9 (*)    pO2, Ven 54.0 (*)    Bicarbonate 29.5 (*)    Acid-Base Excess 7.0 (*)    Potassium 3.1 (*)    Calcium, Ion 0.91 (*)    All other components within normal limits  TROPONIN I (HIGH SENSITIVITY) - Abnormal; Notable for the following components:   Troponin I (High Sensitivity) 109 (*)    All other components within normal limits  TROPONIN I (HIGH SENSITIVITY)    EKG EKG Interpretation  Date/Time:  Wednesday August 06 2020 08:42:07 EDT Ventricular Rate:  125 PR Interval:  134 QRS Duration: 84 QT Interval:  327 QTC Calculation: 472 R Axis:   51 Text Interpretation: Sinus tachycardia Atrial premature complexes Consider right atrial enlargement Borderline repolarization abnormality rate faster than previous, otherwise similar Confirmed by Frederick Peers [phone]) on 08/06/2020 9:09:52 AM   Radiology DG Chest Port 1  View  Result Date: 08/06/2020 CLINICAL DATA:  Shortness of breath. EXAM: PORTABLE CHEST 1 VIEW COMPARISON:  Chest x-ray 08/02/2020. FINDINGS: Mediastinum hilar structures normal. Cardiomegaly. No pulmonary venous congestion. No focal infiltrate. No pleural effusion or pneumothorax. No acute bony abnormality. IMPRESSION: 1.  Cardiomegaly.  No pulmonary venous congestion. 2.  No acute infiltrate. Electronically Signed   By: Maisie Fus  Register   On: 08/06/2020 09:17    Procedures Procedures  CRITICAL CARE Performed by: Ambrose Finland Zoey Gilkeson   Total critical care time: 30 minutes  Critical care time was exclusive of separately billable procedures and treating other patients.  Critical care was necessary to treat or prevent imminent or life-threatening deterioration.  Critical care was time spent personally by me on the following activities: development of treatment plan with patient and/or surrogate as well as nursing, discussions with consultants, evaluation of patient's response to treatment, examination of patient, obtaining history from patient or surrogate, ordering and performing treatments and interventions, ordering and review of laboratory studies, ordering and review of radiographic studies, pulse oximetry and re-evaluation of patient's condition.  Medications Ordered in ED Medications  furosemide (LASIX) injection 40 mg (40 mg Intravenous Given 08/06/20 0855)  nitroGLYCERIN (NITROGLYN) 2 % ointment 1 inch (1 inch Topical Given 08/06/20 0857)  ipratropium-albuterol (DUONEB) 0.5-2.5 (3) MG/3ML nebulizer solution 3 mL (3 mLs Nebulization Given 08/06/20 0856)    ED Course  I have reviewed the triage vital signs and the nursing notes.  Pertinent labs & imaging results that were available during my care of the patient were reviewed by me and considered in my medical decision making (see chart for details).    MDM Rules/Calculators/A&P                          Pt alert, mild respiratory  distress but able to speak in full  sentences. Hypertensive, 95% on RA. Sx suggest volume overload, possible CHF exacerbation combined w/ underlying COPD. Gave NTG paste, duoneb, IV lasix.  His work of breathing improved but nursing eventually placed him on a small amount of supplemental oxygen and he remained tachycardiac.  Lab work notable for UDS positive for amphetamines, reassuring VBG, normal creatinine, troponin 109 which has climbed from previous troponin in the 70s at ED evaluation a few days ago, BNP 940, WBC 15.9 which may be reflective of recent steroid use.  I am concerned about the possibility of NSTEMI versus heart strain due to CHF exacerbation.  I recommended admission for cardiology evaluation, diuresis, and further work-up.  Patient refused admission.  I reiterated my concerns that he may have life-threatening cardiac process.  I discussed risks of leaving including worsening condition, permanent disability, or death.  He voiced understanding but stated that he had to take care of some things at home.  I provided him with his medications and instructed to return to the ED as soon as possible; if not, he should see cardiology ASAP.  Instructed to take Lasix twice daily for few days to help with diuresis. Final Clinical Impression(s) / ED Diagnoses Final diagnoses:  Acute on chronic congestive heart failure, unspecified heart failure type [facility])  NSTEMI (non-ST elevated myocardial infarction) [facility])    Rx / DC Orders ED Discharge Orders         Ordered    lisinopril (ZESTRIL) 20 MG tablet  Daily        08/06/20 1131           Rosaleigh Brazzel, Ambrose Finland, MD 08/06/20 1300

## 2020-08-06 NOTE — ED Triage Notes (Signed)
Pt BIB [facility] c/o SOB since 0200 this AM. Pt was in tripod position on EMS arrival, diminished lung sounds throughout, speaking in short sentences. Pt had a neb tx at home, received 5 mg albuterol and .5 atrovent with EMS. Pt had expiratory rales after EMS tx. Pt c/o pain to mid-right back and CP that started at 0200. Pt received ASA and NTG with EMS. Pt has been on CPAP before, refused with EMS.   EMS VS- 178/140, HR 120, SpO2 100% NRB

## 2020-08-06 NOTE — Discharge Instructions (Signed)
PLEASE COME BACK TO THE ER TO BE ADMITTED TO THE HOSPITAL AS SOON AS POSSIBLE. I AM CONCERNED YOUR HEART FAILURE IS WORSE AND YOU MAY BE HAVING A HEART ATTACK. IF YOU DO NOT RETURN TO THE HOSPITAL, YOU COULD GET WORSE OR EVEN DIE AT HOME.  TAKE LASIX 40MG  TWICE DAILY FOR 3 MORE DAYS, THEN ONCE DAILY AFTER THAT.

## 2020-08-06 NOTE — ED Notes (Signed)
Pt ambulatory to restroom. When pt got back to room, pt felt increasingly SOB. SpO2 89% on RA. Pt placed on 2L via [place]. Pt currently 95% on 2L via [place], reporting decreased SOB.

## 2020-08-06 NOTE — ED Notes (Signed)
Pt requesting to leave AMA. Little, MD made aware and spoke with pt. Pt advised of risks of signing out AMA, still requesting to leave AMA.

## 2020-08-08 ENCOUNTER — Encounter (HOSPITAL_COMMUNITY): Payer: Self-pay

## 2020-08-08 ENCOUNTER — Other Ambulatory Visit: Payer: Self-pay

## 2020-08-08 ENCOUNTER — Inpatient Hospital Stay (HOSPITAL_COMMUNITY)
Admission: EM | Admit: 2020-08-08 | Discharge: 2020-08-10 | DRG: 291 | Disposition: A | Discharge status: Home / Self Care | Payer: Self-pay | Attending: Internal Medicine | Admitting: Internal Medicine

## 2020-08-08 ENCOUNTER — Emergency Department (HOSPITAL_COMMUNITY): Payer: Self-pay

## 2020-08-08 DIAGNOSIS — F17210 Nicotine dependence, cigarettes, uncomplicated: Secondary | ICD-10-CM | POA: Diagnosis present

## 2020-08-08 DIAGNOSIS — I160 Hypertensive urgency: Secondary | ICD-10-CM | POA: Diagnosis present

## 2020-08-08 DIAGNOSIS — I5043 Acute on chronic combined systolic (congestive) and diastolic (congestive) heart failure: Secondary | ICD-10-CM

## 2020-08-08 DIAGNOSIS — T464X6A Underdosing of angiotensin-converting-enzyme inhibitors, initial encounter: Secondary | ICD-10-CM | POA: Diagnosis present

## 2020-08-08 DIAGNOSIS — Z20822 Contact with and (suspected) exposure to covid-19: Secondary | ICD-10-CM | POA: Diagnosis present

## 2020-08-08 DIAGNOSIS — Z5900 Homelessness unspecified: Secondary | ICD-10-CM

## 2020-08-08 DIAGNOSIS — I509 Heart failure, unspecified: Secondary | ICD-10-CM

## 2020-08-08 DIAGNOSIS — J441 Chronic obstructive pulmonary disease with (acute) exacerbation: Secondary | ICD-10-CM | POA: Diagnosis present

## 2020-08-08 DIAGNOSIS — I255 Ischemic cardiomyopathy: Secondary | ICD-10-CM | POA: Diagnosis present

## 2020-08-08 DIAGNOSIS — F1510 Other stimulant abuse, uncomplicated: Secondary | ICD-10-CM | POA: Diagnosis present

## 2020-08-08 DIAGNOSIS — I248 Other forms of acute ischemic heart disease: Secondary | ICD-10-CM | POA: Diagnosis present

## 2020-08-08 DIAGNOSIS — Z79899 Other long term (current) drug therapy: Secondary | ICD-10-CM

## 2020-08-08 DIAGNOSIS — I214 Non-ST elevation (NSTEMI) myocardial infarction: Secondary | ICD-10-CM

## 2020-08-08 DIAGNOSIS — Z885 Allergy status to narcotic agent status: Secondary | ICD-10-CM

## 2020-08-08 DIAGNOSIS — J9621 Acute and chronic respiratory failure with hypoxia: Secondary | ICD-10-CM

## 2020-08-08 DIAGNOSIS — Z8261 Family history of arthritis: Secondary | ICD-10-CM

## 2020-08-08 DIAGNOSIS — Z91138 Patient's unintentional underdosing of medication regimen for other reason: Secondary | ICD-10-CM

## 2020-08-08 DIAGNOSIS — E785 Hyperlipidemia, unspecified: Secondary | ICD-10-CM | POA: Diagnosis present

## 2020-08-08 DIAGNOSIS — Z9861 Coronary angioplasty status: Secondary | ICD-10-CM

## 2020-08-08 DIAGNOSIS — R7989 Other specified abnormal findings of blood chemistry: Secondary | ICD-10-CM

## 2020-08-08 DIAGNOSIS — T82855S Stenosis of coronary artery stent, sequela: Secondary | ICD-10-CM

## 2020-08-08 DIAGNOSIS — R0602 Shortness of breath: Secondary | ICD-10-CM | POA: Insufficient documentation

## 2020-08-08 DIAGNOSIS — I10 Essential (primary) hypertension: Secondary | ICD-10-CM

## 2020-08-08 DIAGNOSIS — Y712 Prosthetic and other implants, materials and accessory cardiovascular devices associated with adverse incidents: Secondary | ICD-10-CM | POA: Diagnosis present

## 2020-08-08 DIAGNOSIS — R9431 Abnormal electrocardiogram [ECG] [EKG]: Secondary | ICD-10-CM

## 2020-08-08 DIAGNOSIS — T501X6A Underdosing of loop [high-ceiling] diuretics, initial encounter: Secondary | ICD-10-CM | POA: Diagnosis present

## 2020-08-08 DIAGNOSIS — R778 Other specified abnormalities of plasma proteins: Secondary | ICD-10-CM

## 2020-08-08 DIAGNOSIS — B1920 Unspecified viral hepatitis C without hepatic coma: Secondary | ICD-10-CM | POA: Diagnosis present

## 2020-08-08 DIAGNOSIS — Z888 Allergy status to other drugs, medicaments and biological substances status: Secondary | ICD-10-CM

## 2020-08-08 DIAGNOSIS — I420 Dilated cardiomyopathy: Secondary | ICD-10-CM | POA: Diagnosis present

## 2020-08-08 DIAGNOSIS — I2510 Atherosclerotic heart disease of native coronary artery without angina pectoris: Secondary | ICD-10-CM

## 2020-08-08 DIAGNOSIS — Z7982 Long term (current) use of aspirin: Secondary | ICD-10-CM

## 2020-08-08 DIAGNOSIS — I5033 Acute on chronic diastolic (congestive) heart failure: Secondary | ICD-10-CM

## 2020-08-08 DIAGNOSIS — I110 Hypertensive heart disease with heart failure: Principal | ICD-10-CM | POA: Diagnosis present

## 2020-08-08 DIAGNOSIS — F1910 Other psychoactive substance abuse, uncomplicated: Secondary | ICD-10-CM

## 2020-08-08 DIAGNOSIS — J9622 Acute and chronic respiratory failure with hypercapnia: Secondary | ICD-10-CM

## 2020-08-08 DIAGNOSIS — I252 Old myocardial infarction: Secondary | ICD-10-CM

## 2020-08-08 LAB — CBC WITH DIFFERENTIAL/PLATELET
Abs Immature Granulocytes: 0.19 10*3/uL — ABNORMAL HIGH (ref 0.00–0.07)
Basophils Absolute: 0.1 10*3/uL (ref 0.0–0.1)
Basophils Relative: 1 %
Eosinophils Absolute: 0.3 10*3/uL (ref 0.0–0.5)
Eosinophils Relative: 2 %
HCT: 49.3 % (ref 39.0–52.0)
Hemoglobin: 16.2 g/dL (ref 13.0–17.0)
Immature Granulocytes: 2 %
Lymphocytes Relative: 27 %
Lymphs Abs: 3.4 10*3/uL (ref 0.7–4.0)
MCH: 32.1 pg (ref 26.0–34.0)
MCHC: 32.9 g/dL (ref 30.0–36.0)
MCV: 97.8 fL (ref 80.0–100.0)
Monocytes Absolute: 1.3 10*3/uL — ABNORMAL HIGH (ref 0.1–1.0)
Monocytes Relative: 10 %
Neutro Abs: 7.5 10*3/uL (ref 1.7–7.7)
Neutrophils Relative %: 58 %
Platelets: 351 10*3/uL (ref 150–400)
RBC: 5.04 MIL/uL (ref 4.22–5.81)
RDW: 13.2 % (ref 11.5–15.5)
WBC: 12.9 10*3/uL — ABNORMAL HIGH (ref 4.0–10.5)
nRBC: 0 % (ref 0.0–0.2)

## 2020-08-08 LAB — COMPREHENSIVE METABOLIC PANEL
ALT: 73 U/L — ABNORMAL HIGH (ref 0–44)
AST: 44 U/L — ABNORMAL HIGH (ref 15–41)
Albumin: 3.6 g/dL (ref 3.5–5.0)
Alkaline Phosphatase: 83 U/L (ref 38–126)
Anion gap: 6 (ref 5–15)
BUN: 15 mg/dL (ref 6–20)
CO2: 39 mmol/L — ABNORMAL HIGH (ref 22–32)
Calcium: 9.6 mg/dL (ref 8.9–10.3)
Chloride: 94 mmol/L — ABNORMAL LOW (ref 98–111)
Creatinine, Ser: 1.15 mg/dL (ref 0.61–1.24)
GFR, Estimated: >60 mL/min (ref >60)
Glucose, Bld: 116 mg/dL — ABNORMAL HIGH (ref 70–99)
Potassium: 4.1 mmol/L (ref 3.5–5.1)
Sodium: 139 mmol/L (ref 135–145)
Total Bilirubin: 0.7 mg/dL (ref 0.3–1.2)
Total Protein: 6.6 g/dL (ref 6.5–8.1)

## 2020-08-08 LAB — BRAIN NATRIURETIC PEPTIDE: B Natriuretic Peptide: 961.3 pg/mL — ABNORMAL HIGH (ref 0.0–100.0)

## 2020-08-08 LAB — TROPONIN I (HIGH SENSITIVITY): Troponin I (High Sensitivity): 99 ng/L — ABNORMAL HIGH (ref <18)

## 2020-08-08 MED ORDER — LISINOPRIL 20 MG PO TABS
20.0000 mg | ORAL_TABLET | Freq: Once | ORAL | Status: AC
  Administered 2020-08-08: 20 mg via ORAL
  Filled 2020-08-08: qty 1

## 2020-08-08 MED ORDER — FUROSEMIDE 10 MG/ML IJ SOLN
40.0000 mg | Freq: Once | INTRAMUSCULAR | Status: AC
  Administered 2020-08-09: 40 mg via INTRAVENOUS
  Filled 2020-08-08: qty 4

## 2020-08-08 MED ORDER — ASPIRIN 81 MG PO CHEW
324.0000 mg | CHEWABLE_TABLET | Freq: Once | ORAL | Status: AC
  Administered 2020-08-08: 324 mg via ORAL
  Filled 2020-08-08: qty 4

## 2020-08-08 NOTE — Consult Note (Signed)
Cardiology Consultation:   Patient ID: Francisco [name] MRN: [id number]; DOB: 1960/05/23  Admit date: 08/08/2020 Date of Consult: 08/08/2020  PCP:  Patient, No Pcp Per (Inactive)   [facility]  Cardiologist:  Peter Swaziland, MD  Advanced Practice Provider:  No care team member to display Electrophysiologist:  Lewayne Bunting, MD   Click here to update Patient Care Team and Refresh Note - MD (PCP) or APP (Team Member)  Change PCP Type for MD, Specialty for APP is either Cardiology or Clinical Cardiac Electrophysiology  :[id number]}    Patient Profile:   Francisco [name] is a 60 y.o. male with a hx of COPD, CAD status post PCI 5 years ago in [place], medication noncompliance, hep C not treated per notes, IVDU previously heroin now meth, tobacco use  who is being seen today for the evaluation of heart failure at the request of Dr Almeta Monas.  History of Present Illness:   Francisco [name] is a 60 y/o male with PMH COPD, CAD status post PCI 5 years ago in [place], medication noncompliance, hep C not treated per notes, IVDU previously heroin now meth, tobacco use presenting to the hospital with c/o SOB  The patient was just in the ER on 4/20 with c/o SOB and chest heaviness and on 4/16- was given steorids, lasix, inhalers. On 4/20 labs noted for amphetamines and work up showed elevated BNP- advised for admission but he refused the admission and went home.  Today, comes back with c/o worsening SOB, leg swelling which is going on for weeks but worse since he left the ER. He reports NYHA class III symptoms, and constant chest pressure like sensation due to difficulty breathing, also reports orthopnea, PND. Endorses non compliant with medications except aspirin, inhalers and recently started back on lasix He endorses using tobacco, and smoking amphetamines. Denies IV drug use.  ER work up:  HR 117, RR 22, BP 207/130-> 164/129 mmHg Labs show: BNP 961, trop 99 (Down from 124 on 4/20). LFTs  up, EKG sinus tachy. Got aspirin, lisinopril and IV lasix 40mg  as well as SL nitro given HTN and chest pressure like sensation (more of breathing issue).   Past Medical History:  Diagnosis Date  . CHF (congestive heart failure) (HCC)   . COPD (chronic obstructive pulmonary disease) (HCC)   . Coronary artery disease    a. s/p prior PCI.  Hepatitis-C   . Hypertension   . IV drug abuse (HCC)    a. 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  . Medically noncompliant   . MI (myocardial infarction) (HCC)   . Tobacco abuse     Past Surgical History:  Procedure Laterality Date  . BACK SURGERY    . NECK SURGERY    . RIGHT/LEFT HEART CATH AND CORONARY ANGIOGRAPHY N/A 04/30/2019   Procedure: RIGHT/LEFT HEART CATH AND CORONARY ANGIOGRAPHY;  Surgeon: 06/28/2019, MD;  Location: [facility] INVASIVE CV LAB;  Service: Cardiovascular;  Laterality: N/A;  . stints       Home Medications:  Prior to Admission medications   Medication Sig Start Date End Date Taking? Authorizing Provider  albuterol (PROVENTIL) (2.5 MG/3ML) 0.083% nebulizer solution Take 3 mLs (2.5 mg total) by nebulization every 6 (six) hours as needed for wheezing or shortness of breath. 05/10/20  Yes Pollina, 05/12/20, MD  albuterol (VENTOLIN HFA) 108 (90 Base) MCG/ACT inhaler Inhale 1-2 puffs into the lungs every 6 (six) hours as needed for wheezing or shortness of breath. 05/06/20  Yes Green,  Sharion Settler, PA-C  aspirin EC 81 MG tablet Take 1 tablet (81 mg total) by mouth daily. 01/04/20  Yes Tilden Fossa, MD  furosemide (LASIX) 40 MG tablet Take 1 tablet (40 mg total) by mouth daily. 01/17/20 02/16/20 Yes McClung, Marzella Schlein, PA-C  ibuprofen (ADVIL) 200 MG tablet Take 400 mg by mouth every 6 (six) hours as needed for moderate pain.   Yes [provider]  lisinopril (ZESTRIL) 20 MG tablet Take 1 tablet (20 mg total) by mouth daily. 08/06/20  Yes Little, Ambrose Finland, MD  nitroGLYCERIN (NITROSTAT) 0.4 MG SL tablet Place 1  tablet (0.4 mg total) under the tongue every 5 (five) minutes x 3 doses as needed for chest pain. 08/20/19  Yes Shahmehdi, Gemma Payor, MD    Inpatient Medications: Scheduled Meds: . furosemide  40 mg Intravenous Once   Continuous Infusions:  PRN Meds:   Allergies:    Allergies  Allergen Reactions  . Ivp Dye [Iodinated Diagnostic Agents] Other (See Comments)    Seizures   . Tramadol Other (See Comments)    Seizures     Social History:   Social History   Socioeconomic History  . Marital status: Divorced    Spouse name: Not on file  . Number of children: Not on file  . Years of education: Not on file  . Highest education level: Not on file  Occupational History  . Not on file  Tobacco Use  . Smoking status: Current Every Day Smoker    Packs/day: 0.75    Types: Cigarettes  . Smokeless tobacco: Never Used  Vaping Use  . Vaping Use: Never used  Substance and Sexual Activity  . Alcohol use: Never  . Drug use: Yes    Types: Methamphetamines    Comment: heroin  . Sexual activity: Not on file  Other Topics Concern  . Not on file  Social History Narrative  . Not on file   Social Determinants of Health   Financial Resource Strain: Not on file  Food Insecurity: Not on file  Transportation Needs: Not on file  Physical Activity: Not on file  Stress: Not on file  Social Connections: Not on file  Intimate Partner Violence: Not on file    Family History:    Family History  Problem Relation Age of Onset  . Rheum arthritis Mother      ROS:  Please see the history of present illness.   All other ROS reviewed and negative.     Physical Exam/Data:   Vitals:   08/08/20 2243 08/08/20 2244 08/08/20 2251 08/08/20 2307  BP: (!) 157/123  (!) 157/123 (!) 164/129  Pulse:  (!) 108  (!) 111  Resp: 20 18  (!) 22  Temp:      SpO2:  97%  98%  Weight:      Height:       No intake or output data in the 24 hours ending 08/08/20 2326 Last 3 Weights 08/08/2020 08/06/2020  08/02/2020  Weight (lbs) 179 lb 14.3 oz 180 lb 198 lb 6.6 oz  Weight (kg) 81.6 kg 81.647 kg 90 kg     Body mass index is 24.4 kg/m.  General:  Well nourished, well developed, in no acute distress HEENT: normal Lymph: no adenopathy Neck: JVD up++ Endocrine:  No thryomegaly Vascular: No carotid bruits; FA pulses 2+ bilaterally without bruits  Cardiac:  Tachycardic, no murmurs Lungs:  B/l crackles+ Abd: soft, nontender, no hepatomegaly  Ext: no edema Musculoskeletal:  No deformities,  BUE and BLE strength normal and equal Skin: warm , 2+LE edema Neuro:  CNs 2-12 intact, no focal abnormalities noted Psych:  Normal affect   EKG:  The EKG was personally reviewed and demonstrates:  Sinus tachycardia, LVH Telemetry:  Telemetry was personally reviewed and demonstrates:    Relevant CV Studies: ECHO: 08/18/2019 IMPRESSIONS  1. Low normal LV systolic function; mild LVH; grade 2 diastolic  dysfunction.  2. Left ventricular ejection fraction, by estimation, is 50 to 55%. The  left ventricle has low normal function. The left ventricle has no regional  wall motion abnormalities. There is mild left ventricular hypertrophy.  Left ventricular diastolic  parameters are consistent with Grade II diastolic dysfunction  (pseudonormalization).  3. Right ventricular systolic function is normal. The right ventricular  size is normal.  4. The mitral valve is normal in structure. Trivial mitral valve  regurgitation. No evidence of mitral stenosis.  5. The aortic valve is tricuspid. Aortic valve regurgitation is not  visualized. Mild aortic valve sclerosis is present, with no evidence of  aortic valve stenosis.  6. The inferior vena cava is normal in size with greater than 50%  respiratory variability, suggesting right atrial pressure of 3 mmHg.    Right and left heart cath 02/28/20:  Prox RCA to Mid RCA lesion is 50% stenosed - In-stent re-stenosis of distal 1/2 of stented segment (by report 3  overlapping stents). The remainder of the stent segment is widely patent.  Prox LAD lesion is 40% stenosed with 50% stenosed side branch in 1st Diag.  Mid Cx lesion is 25% stenosed.  Hemodynamic findings consistent with severe pulmonary hypertension.  LV end diastolic pressure is severely elevated.  SUMMARY  Moderate in-stent restenosis of the RCA with moderate bifurcation LAD-diagonal disease but no obstructive disease.  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  Have written for 80 mg of Lasix x1 today and then 40 mg IV twice daily starting tomorrow.  If urine output does not pick up, may want to consider milrinone.  Laboratory Data:  High Sensitivity Troponin:   Recent Labs  Lab 08/02/20 0041 08/02/20 0328 08/06/20 0844 08/06/20 1112 08/08/20 2145  TROPONINIHS 72* 74* 109* 124* 99*     Chemistry Recent Labs  Lab 08/02/20 0041 08/06/20 0844 08/06/20 0856 08/08/20 2145  NA 139 140 138 139  K 4.1 3.3* 3.1* 4.1  CL 99 101  --  94*  CO2 32 27  --  39*  GLUCOSE 83 91  --  116*  BUN 10 18  --  15  CREATININE 0.92 1.05  --  1.15  CALCIUM 9.6 9.0  --  9.6  GFRNONAA >60 >60  --  >60  ANIONGAP 8 12  --  6    Recent Labs  Lab 08/06/20 0844 08/08/20 2145  PROT 6.4* 6.6  ALBUMIN 3.4* 3.6  AST 26 44*  ALT 48* 73*  ALKPHOS 67 83  BILITOT 0.6 0.7   Hematology Recent Labs  Lab 08/02/20 0041 08/06/20 0844 08/06/20 0856 08/08/20 2145  WBC 11.6* 15.9*  --  12.9*  RBC 4.75 4.51  --  5.04  HGB 15.2 14.6 15.0 16.2  HCT 45.9 44.7 44.0 49.3  MCV 96.6 99.1  --  97.8  MCH 32.0 32.4  --  32.1  MCHC 33.1 32.7  --  32.9  RDW 13.1 13.5  --   13.2  PLT 248 310  --  351   BNP Recent Labs  Lab 08/02/20 0141 08/06/20 0844 08/08/20 2145  BNP 1,204.8* 940.8* 961.3*    DDimer No results for input(s): DDIMER in the last 168 hours.   Radiology/Studies:  DG Chest 2 View  Result Date: 08/08/2020 CLINICAL DATA:  Chest pain, shortness of breath, and lower extremity swelling. EXAM: CHEST - 2 VIEW COMPARISON:  08/06/2020 FINDINGS: The heart size and mediastinal contours are within normal limits. Both lungs are clear. Cervical spine fusion hardware again noted. IMPRESSION: No active cardiopulmonary disease. Electronically Signed   By: Danae Orleans M.D.   On: 08/08/2020 21:58   DG Chest Port 1 View  Result Date: 08/06/2020 CLINICAL DATA:  Shortness of breath. EXAM: PORTABLE CHEST 1 VIEW COMPARISON:  Chest x-ray 08/02/2020. FINDINGS: Mediastinum hilar structures normal. Cardiomegaly. No pulmonary venous congestion. No focal infiltrate. No pleural effusion or pneumothorax. No acute bony abnormality. IMPRESSION: 1.  Cardiomegaly.  No pulmonary venous congestion. 2.  No acute infiltrate. Electronically Signed   By: Maisie Fus  Register   On: 08/06/2020 09:17     Assessment and Plan:   1. Acute on chronic HFimprovedEF,  50-55% (previously 25-30%)  Dilated CMP (ischemic+non ischemic) 2. HTNsive urgency 3. Troponin elevation sec to above 4. CAD s/p PCI to RCA (ISR per [facility] 11/21), moderate bifurcation LAD-D dx 5. HTN, HLD 6. COPD H/o Hep C, h/o substance use and non compliance Homelessness  Plan: - admit to medicine team - s/p IV lasix 40mg  in the ER, would continue IV lasix 40mg  BID  Monitor I/Os and goal is net negative 1-1.5lts. - continue aspirin 81mg , put him on atorva 40mg  daily (his LFTs are chronically up likely from Hep C, ?Cirrhosis). Get abdomen and futher work up.  - trend trops, repeat ECHO in am. NO indication for heparin gtt as this is demand ischemia - GDMT: need to uptitrate him on meds and needs compliance  - put him  on toprol xl 25mg  daily,increase  lisinopril  40mg  - SW and Case manager consult for social needs - he probably has PAD- but no claudication- continue aspirin, statin  Advise on quitting substance use and be compliant with meds  Risk Assessment/Risk Scores:        New York Heart Association (NYHA) Functional Class NYHA Class III        For questions or updates, please contact [facility] Please consult [email or web address] for contact info under    Signed, , MD  08/08/2020 11:26 PM

## 2020-08-08 NOTE — ED Provider Notes (Signed)
[facility] EMERGENCY DEPARTMENT Provider Note   CSN: [id number] Arrival date & time: 08/08/20  2109     History Chief Complaint  Patient presents with  . Shortness of Breath  . Chest Pain    Francisco [name] is a 60 y.o. male.  HPI  Patient is a 60 year old male with an extensive medical history including COPD, CHF, hepatitis, hypertension, CAD status post stents in the past, presenting with a chief complaint of recurrent shortness of breath.  He was recently seen in the emergency department diagnosed with heart failure exacerbation and NSTEMI, recommended admission and left AMA.  Patient states that since his discharge he has continued to have this chest pressure and shortness of breath.  He had things to take care of at home and on arrival today he states that he is willing to stay for further evaluation management at this time.  He continues to be short of breath despite using his Ventolin inhaler at home.  He is not taking any of his other medicines other than his Lasix.  Past Medical History:  Diagnosis Date  . CHF (congestive heart failure) (HCC)   . COPD (chronic obstructive pulmonary disease) (HCC)   . Coronary artery disease    a. s/p prior PCI.  Marland Kitchen Hepatitis-C   . Hypertension   . IV drug abuse (HCC)    a. 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  . Medically noncompliant   . MI (myocardial infarction) (HCC)   . Tobacco abuse     Patient Active Problem List   Diagnosis Date Noted  . Acute on chronic respiratory failure with hypoxia and hypercapnia (HCC) 01/10/2020  . COPD exacerbation (HCC) 01/09/2020  . Hypotension 08/18/2019  . Acute on chronic combined systolic (congestive) and diastolic (congestive) heart failure (HCC) 08/17/2019  . 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  . Chronic obstructive pulmonary disease (HCC)   . Elevated troponin   . Unstable angina (HCC)   . Dilated cardiomyopathy (HCC)   . CAD S/P percutaneous coronary  angioplasty   . ACS (acute coronary syndrome) (HCC) 04/29/2019  . Acute respiratory failure with hypoxia (HCC) 06/17/2018    Past Surgical History:  Procedure Laterality Date  . BACK SURGERY    . NECK SURGERY    . RIGHT/LEFT HEART CATH AND CORONARY ANGIOGRAPHY N/A 04/30/2019   Procedure: RIGHT/LEFT HEART CATH AND CORONARY ANGIOGRAPHY;  Surgeon: Marykay Lex, MD;  Location: [facility] INVASIVE CV LAB;  Service: Cardiovascular;  Laterality: N/A;  . stints         Family History  Problem Relation Age of Onset  . Rheum arthritis Mother     Social History   Tobacco Use  . Smoking status: Current Every Day Smoker    Packs/day: 0.75    Types: Cigarettes  . Smokeless tobacco: Never Used  Vaping Use  . Vaping Use: Never used  Substance Use Topics  . Alcohol use: Never  . Drug use: Yes    Types: Methamphetamines    Comment: heroin    Home Medications Prior to Admission medications   Medication Sig Start Date End Date Taking? Authorizing Provider  albuterol (PROVENTIL) (2.5 MG/3ML) 0.083% nebulizer solution Take 3 mLs (2.5 mg total) by nebulization every 6 (six) hours as needed for wheezing or shortness of breath. 05/10/20  Yes Pollina, Canary Brim, MD  albuterol (VENTOLIN HFA) 108 (90 Base) MCG/ACT inhaler Inhale 1-2 puffs into the lungs every 6 (six) hours as needed for wheezing or shortness of breath. 05/06/20  Yes  Lorelee New, PA-C  aspirin EC 81 MG tablet Take 1 tablet (81 mg total) by mouth daily. 01/04/20  Yes Tilden Fossa, MD  furosemide (LASIX) 40 MG tablet Take 1 tablet (40 mg total) by mouth daily. 01/17/20 02/16/20 Yes McClung, Marzella Schlein, PA-C  ibuprofen (ADVIL) 200 MG tablet Take 400 mg by mouth every 6 (six) hours as needed for moderate pain.   Yes [provider]  lisinopril (ZESTRIL) 20 MG tablet Take 1 tablet (20 mg total) by mouth daily. 08/06/20  Yes Little, Ambrose Finland, MD  nitroGLYCERIN (NITROSTAT) 0.4 MG SL tablet Place 1 tablet (0.4 mg total) under  the tongue every 5 (five) minutes x 3 doses as needed for chest pain. 08/20/19  Yes Shahmehdi, Seyed A, MD    Allergies    Ivp dye [iodinated diagnostic agents] and Tramadol  Review of Systems   Review of Systems  Constitutional: Positive for fatigue. Negative for chills and fever.  HENT: Negative for ear pain and sore throat.   Eyes: Negative for pain and visual disturbance.  Respiratory: Positive for shortness of breath. Negative for cough.   Cardiovascular: Positive for chest pain and leg swelling. Negative for palpitations.  Gastrointestinal: Negative for abdominal pain and vomiting.  Genitourinary: Negative for dysuria and hematuria.  Musculoskeletal: Negative for arthralgias and back pain.  Skin: Negative for color change and rash.  Neurological: Negative for seizures and syncope.  All other systems reviewed and are negative.   Physical Exam Updated Vital Signs BP (!) 164/129   Pulse (!) 111   Temp 98.1 F (36.7 C)   Resp (!) 22   Ht 6' (1.829 m)   Wt 81.6 kg   SpO2 98%   BMI 24.40 kg/m   Physical Exam Vitals and nursing note reviewed.  Constitutional:      Appearance: He is well-developed.  HENT:     Head: Normocephalic and atraumatic.     Nose: No congestion or rhinorrhea.     Mouth/Throat:     Mouth: Mucous membranes are moist.     Pharynx: Oropharynx is clear. No oropharyngeal exudate.  Eyes:     Conjunctiva/sclera: Conjunctivae normal.     Pupils: Pupils are equal, round, and reactive to light.  Cardiovascular:     Rate and Rhythm: Normal rate and regular rhythm.     Heart sounds: No murmur heard.   Pulmonary:     Effort: Pulmonary effort is normal. Tachypnea present. No respiratory distress.     Breath sounds: Wheezing and rales present.  Abdominal:     Palpations: Abdomen is soft.     Tenderness: There is no abdominal tenderness.  Musculoskeletal:        General: No swelling, tenderness, deformity or signs of injury. Normal range of motion.      Cervical back: Neck supple. No rigidity or tenderness.  Skin:    General: Skin is warm and dry.  Neurological:     General: No focal deficit present.     Mental Status: He is alert and oriented to person, place, and time. Mental status is at baseline.     Cranial Nerves: No cranial nerve deficit.     Motor: No weakness.     ED Results / Procedures / Treatments   Labs (all labs ordered are listed, but only abnormal results are displayed) Labs Reviewed  COMPREHENSIVE METABOLIC PANEL - Abnormal; Notable for the following components:      Result Value   Chloride 94 (*)  CO2 39 (*)    Glucose, Bld 116 (*)    AST 44 (*)    ALT 73 (*)    All other components within normal limits  CBC WITH DIFFERENTIAL/PLATELET - Abnormal; Notable for the following components:   WBC 12.9 (*)    Monocytes Absolute 1.3 (*)    Abs Immature Granulocytes 0.19 (*)    All other components within normal limits  BRAIN NATRIURETIC PEPTIDE - Abnormal; Notable for the following components:   B Natriuretic Peptide 961.3 (*)    All other components within normal limits  TROPONIN I (HIGH SENSITIVITY) - Abnormal; Notable for the following components:   Troponin I (High Sensitivity) 99 (*)    All other components within normal limits  RESP PANEL BY RT-PCR (FLU A&B, COVID) ARPGX2  TROPONIN I (HIGH SENSITIVITY)    EKG EKG Interpretation  Date/Time:  Friday August 08 2020 21:22:45 EDT Ventricular Rate:  119 PR Interval:  124 QRS Duration: 90 QT Interval:  354 QTC Calculation: 497 R Axis:   58 Text Interpretation: Sinus tachycardia Right atrial enlargement ST & T wave abnormality, consider lateral ischemia Abnormal ECG Confirmed by Tilden Fossa [phone]) on 08/08/2020 10:24:21 PM   Radiology DG Chest 2 View  Result Date: 08/08/2020 CLINICAL DATA:  Chest pain, shortness of breath, and lower extremity swelling. EXAM: CHEST - 2 VIEW COMPARISON:  08/06/2020 FINDINGS: The heart size and mediastinal contours are  within normal limits. Both lungs are clear. Cervical spine fusion hardware again noted. IMPRESSION: No active cardiopulmonary disease. Electronically Signed   By: Danae Orleans M.D.   On: 08/08/2020 21:58    Procedures Procedures   Medications Ordered in ED Medications  furosemide (LASIX) injection 40 mg (has no administration in time range)  aspirin chewable tablet 324 mg (324 mg Oral Given 08/08/20 2251)  lisinopril (ZESTRIL) tablet 20 mg (20 mg Oral Given 08/08/20 2251)    ED Course  I have reviewed the triage vital signs and the nursing notes.  Pertinent labs & imaging results that were available during my care of the patient were reviewed by me and considered in my medical decision making (see chart for details).    MDM Rules/Calculators/A&P                          Medical Decision Making:  Helmuth Recupero is a 60 y.o. male with an extensive cardiac disease, who presented to the ED today with chest pain.  [name] pain started 12 hours ago.  Based on patient's comorbidities, patient falls outside the heart pathway.  On my initial exam, the pt was in no acute distress.  Vital signs notable for tachypnea and tachycardia here. Reviewed and confirmed nursing documentation for past medical history, family history, social history.   Initial Assessment:  With the patient's presentation of left-sided chest pain, most likely diagnose is musculoskeletal chest pain versus ACS. Other diagnoses were considered including (but not limited to) pulmonary embolism, community-acquired pneumonia, aortic dissection, pneumothorax, underlying bony abnormality, anemia. These are considered less likely due to history of present illness and physical exam findings.  In particular, concerning pulmonary embolism: Patient is PERC positive and the they deny malignancy, hormone usage, history of DVT,  or calf tenderness leading to a low risk Wells score.  Initial Plan: 1. Evaluate for ACS with delta troponin and EKG  evaluated as below 2. Evaluate for dissection, bony abnormality, or pneumonia with chest x-ray and screening laboratory evaluation including CBC,  BMP 3. Further evaluation for pulmonary embolism not indicated at this time based on patient's PERC and Wells score.    Initial Study Results:  EKG was reviewed independently and under the supervision of the attending provider. Rate, rhythm, axis, intervals all examined and without medically relevant abnormality. ST segments without concerns for elevations.   Laboratory . Initial troponin elevated . CBC and BMP without obvious metabolic or inflammatory abnormalities requiring further evaluation. WBC consistent with recent steroid and demargination.  Radiology DG Chest 2 View  Final Result      Final Assessment and Plan:  [name] history present also physical exam findings most consistent with heart failure exacerbation.  Discussed case with cardiology on-call who agreed with likely diagnosis of heart failure exacerbation and plan to see patient while in emergency department.  Patient admitted to hospitalist in the interim and treated with 40 mg IV Lasix home dose. 3 CAD with hospitalist accepted patient admission.  Patient admitted at this time.  Final Clinical Impression(s) / ED Diagnoses Final diagnoses:  SOB (shortness of breath)    Rx / DC Orders ED Discharge Orders    None       Glyn Ade, MD 08/08/20 2330    Tilden Fossa, MD 08/09/20 [phone]

## 2020-08-08 NOTE — ED Triage Notes (Signed)
Patient reports SOB and chest pain, leg swelling, hx of COPD and CHF, was seen on Wednesday but left AMA, states he is getting worse.

## 2020-08-08 NOTE — ED Triage Notes (Signed)
Emergency Medicine Provider Triage Evaluation Note  Francisco [name] , a 60 y.o. male  was evaluated in triage.  Pt complains of shortness of breath, chest pain, leg swelling.  Patient has history of COPD and CHF was seen on Wednesday and diagnosed with NSTEMI and CHF exacerbation, Patient left AMA.  Reports worsening shortness of breath and leg swelling since that time.  He also endorses chest pressure over the last 3 days that has remained constant.  He reports he has been using his albuterol rescue inhaler and Lasix but is noncompliant with the rest of his medications  Review of Systems  Positive: Shortness of breath, leg swelling, chest pressure nonproductive cough Negative: Fever, chills, nausea, vomiting, abdominal  Physical Exam  BP (!) 207/130 (BP Location: Left Arm)   Pulse (!) 117   Temp 98.1 F (36.7 C)   Resp (!) 22   Ht 6' (1.829 m)   Wt 81.6 kg   SpO2 98%   BMI 24.40 kg/m  Gen:   Awake, no distress   HEENT:  Atraumatic  Resp:  Tachypnea, crackles to bilateral lower lung  Cardiac:  Tachycardia at rate of 117 MSK:   Moves extremities without difficulty, lower leg edema Neuro:  Speech clear   Medical Decision Making  Medically screening exam initiated at 9:43 PM.  Appropriate orders placed.  Elijha Dedman was informed that the remainder of the evaluation will be completed by another provider, this initial triage assessment does not replace that evaluation, and the importance of remaining in the ED until their evaluation is complete.  Clinical Impression   The patient appears stable so that the remainder of the work up may be completed by another provider.      Haskel Schroeder, [place] 08/08/20 2146

## 2020-08-09 ENCOUNTER — Encounter (HOSPITAL_COMMUNITY): Payer: Self-pay | Admitting: Family Medicine

## 2020-08-09 ENCOUNTER — Inpatient Hospital Stay (HOSPITAL_COMMUNITY): Payer: Self-pay

## 2020-08-09 DIAGNOSIS — I2510 Atherosclerotic heart disease of native coronary artery without angina pectoris: Secondary | ICD-10-CM

## 2020-08-09 DIAGNOSIS — Z9861 Coronary angioplasty status: Secondary | ICD-10-CM

## 2020-08-09 DIAGNOSIS — I5033 Acute on chronic diastolic (congestive) heart failure: Secondary | ICD-10-CM

## 2020-08-09 DIAGNOSIS — I214 Non-ST elevation (NSTEMI) myocardial infarction: Secondary | ICD-10-CM

## 2020-08-09 DIAGNOSIS — I160 Hypertensive urgency: Secondary | ICD-10-CM

## 2020-08-09 DIAGNOSIS — R778 Other specified abnormalities of plasma proteins: Secondary | ICD-10-CM

## 2020-08-09 DIAGNOSIS — R9431 Abnormal electrocardiogram [ECG] [EKG]: Secondary | ICD-10-CM

## 2020-08-09 LAB — RESP PANEL BY RT-PCR (FLU A&B, COVID) ARPGX2
Influenza A by PCR: NEGATIVE
Influenza B by PCR: NEGATIVE
SARS Coronavirus 2 by RT PCR: NEGATIVE

## 2020-08-09 LAB — TROPONIN I (HIGH SENSITIVITY): Troponin I (High Sensitivity): 81 ng/L — ABNORMAL HIGH (ref <18)

## 2020-08-09 LAB — ECHOCARDIOGRAM COMPLETE
AR max vel: 2.61 cm2
AV Area VTI: 2.24 cm2
AV Area mean vel: 2.52 cm2
AV Mean grad: 4 mmHg
AV Peak grad: 6.7 mmHg
Ao pk vel: 1.29 m/s
Area-P 1/2: 5.84 cm2
Height: 72 in
S' Lateral: 3.6 cm
Weight: 3086.44 oz

## 2020-08-09 LAB — LIPID PANEL
Cholesterol: 188 mg/dL (ref 0–200)
HDL: 71 mg/dL (ref >40)
LDL Cholesterol: 98 mg/dL (ref 0–99)
Total CHOL/HDL Ratio: 2.6 RATIO
Triglycerides: 95 mg/dL (ref <150)
VLDL: 19 mg/dL (ref 0–40)

## 2020-08-09 LAB — MRSA PCR SCREENING: MRSA by PCR: NEGATIVE

## 2020-08-09 MED ORDER — METOPROLOL SUCCINATE ER 25 MG PO TB24
25.0000 mg | ORAL_TABLET | Freq: Every day | ORAL | Status: DC
  Administered 2020-08-09 – 2020-08-10 (×3): 25 mg via ORAL
  Filled 2020-08-09 (×3): qty 1

## 2020-08-09 MED ORDER — NITROGLYCERIN 0.4 MG SL SUBL
0.4000 mg | SUBLINGUAL_TABLET | SUBLINGUAL | Status: DC | PRN
  Administered 2020-08-09: 0.4 mg via SUBLINGUAL
  Filled 2020-08-09: qty 1

## 2020-08-09 MED ORDER — OXYCODONE HCL 5 MG PO TABS
5.0000 mg | ORAL_TABLET | Freq: Four times a day (QID) | ORAL | Status: DC | PRN
  Administered 2020-08-09 – 2020-08-10 (×3): 5 mg via ORAL
  Filled 2020-08-09 (×3): qty 1

## 2020-08-09 MED ORDER — SODIUM CHLORIDE 0.9% FLUSH
3.0000 mL | INTRAVENOUS | Status: DC | PRN
Start: 2020-08-09 — End: 2020-08-10

## 2020-08-09 MED ORDER — SODIUM CHLORIDE 0.9% FLUSH
3.0000 mL | Freq: Two times a day (BID) | INTRAVENOUS | Status: DC
  Administered 2020-08-09 – 2020-08-10 (×4): 3 mL via INTRAVENOUS

## 2020-08-09 MED ORDER — ATORVASTATIN CALCIUM 40 MG PO TABS
40.0000 mg | ORAL_TABLET | Freq: Every day | ORAL | Status: DC
  Administered 2020-08-09 – 2020-08-10 (×2): 40 mg via ORAL
  Filled 2020-08-09 (×2): qty 1

## 2020-08-09 MED ORDER — FLUTICASONE FUROATE-VILANTEROL 200-25 MCG/INH IN AEPB
1.0000 | INHALATION_SPRAY | Freq: Every day | RESPIRATORY_TRACT | Status: DC
  Administered 2020-08-10: 1 via RESPIRATORY_TRACT
  Filled 2020-08-09: qty 28

## 2020-08-09 MED ORDER — FUROSEMIDE 10 MG/ML IJ SOLN
40.0000 mg | Freq: Once | INTRAMUSCULAR | Status: AC
  Administered 2020-08-09: 40 mg via INTRAVENOUS
  Filled 2020-08-09: qty 4

## 2020-08-09 MED ORDER — NITROGLYCERIN 0.4 MG SL SUBL: 0.4000 mg | SUBLINGUAL_TABLET | SUBLINGUAL | Status: DC | PRN

## 2020-08-09 MED ORDER — FUROSEMIDE 10 MG/ML IJ SOLN
80.0000 mg | Freq: Two times a day (BID) | INTRAMUSCULAR | Status: DC
  Administered 2020-08-09 – 2020-08-10 (×2): 80 mg via INTRAVENOUS
  Filled 2020-08-09 (×2): qty 8

## 2020-08-09 MED ORDER — IPRATROPIUM-ALBUTEROL 0.5-2.5 (3) MG/3ML IN SOLN: 3.0000 mL | RESPIRATORY_TRACT | Status: DC | PRN

## 2020-08-09 MED ORDER — ASPIRIN EC 81 MG PO TBEC: 81.0000 mg | DELAYED_RELEASE_TABLET | Freq: Every day | ORAL | Status: DC

## 2020-08-09 MED ORDER — PERFLUTREN LIPID MICROSPHERE
1.0000 mL | INTRAVENOUS | Status: AC | PRN
  Administered 2020-08-09: 4 mL via INTRAVENOUS
  Filled 2020-08-09: qty 10

## 2020-08-09 MED ORDER — ATORVASTATIN CALCIUM 40 MG PO TABS: 40.0000 mg | ORAL_TABLET | Freq: Every day | ORAL | Status: DC

## 2020-08-09 MED ORDER — LISINOPRIL 20 MG PO TABS
40.0000 mg | ORAL_TABLET | Freq: Every day | ORAL | Status: DC
  Administered 2020-08-09 – 2020-08-10 (×2): 40 mg via ORAL
  Filled 2020-08-09 (×2): qty 2

## 2020-08-09 MED ORDER — FUROSEMIDE 10 MG/ML IJ SOLN
40.0000 mg | Freq: Two times a day (BID) | INTRAMUSCULAR | Status: DC
  Administered 2020-08-09: 40 mg via INTRAVENOUS
  Filled 2020-08-09: qty 4

## 2020-08-09 MED ORDER — ACETAMINOPHEN 325 MG PO TABS
650.0000 mg | ORAL_TABLET | ORAL | Status: DC | PRN
  Filled 2020-08-09: qty 2

## 2020-08-09 MED ORDER — ASPIRIN EC 81 MG PO TBEC
81.0000 mg | DELAYED_RELEASE_TABLET | Freq: Every day | ORAL | Status: DC
  Administered 2020-08-09 – 2020-08-10 (×2): 81 mg via ORAL
  Filled 2020-08-09 (×2): qty 1

## 2020-08-09 MED ORDER — POTASSIUM CHLORIDE CRYS ER 10 MEQ PO TBCR
10.0000 meq | EXTENDED_RELEASE_TABLET | Freq: Two times a day (BID) | ORAL | Status: DC
  Administered 2020-08-09 – 2020-08-10 (×3): 10 meq via ORAL
  Filled 2020-08-09 (×3): qty 1

## 2020-08-09 MED ORDER — DICLOFENAC SODIUM 1 % EX GEL
2.0000 g | Freq: Four times a day (QID) | CUTANEOUS | Status: DC
  Administered 2020-08-09 – 2020-08-10 (×5): 2 g via TOPICAL
  Filled 2020-08-09: qty 100

## 2020-08-09 MED ORDER — SODIUM CHLORIDE 0.9 % IV SOLN: 250.0000 mL | INTRAVENOUS | Status: DC | PRN

## 2020-08-09 MED ORDER — ENOXAPARIN SODIUM 40 MG/0.4ML ~~LOC~~ SOLN
40.0000 mg | SUBCUTANEOUS | Status: DC
  Administered 2020-08-09: 40 mg via SUBCUTANEOUS
  Filled 2020-08-09: qty 0.4

## 2020-08-09 NOTE — Progress Notes (Signed)
Please see H&P from this morning for full details.  Patient seen and examined this morning.  Admit date:08/08/2020 Chief compliant: Ongoing chest discomfort/pressure with dyspnea 60 y/o with COPD, CAD status post PCI 5 years ago in [place] cath 11/21 showing moderate in stent stenosis, medication noncompliance, hep C not treated per notes, IVDU previously heroin now meth, tobacco use presenting to the hospital with c/o SOB,orthopnea, leg swellings and constant chest pressure. HR 117, RR 22, BP 207/130-> 164/129. BNP 961, trop 99 (Down from 124 on 4/20 ED visit). LFTs up, EKG sinus tachy, Qtc 497 ms. Recent d/c ed 4/16 after CHF on lasix po and seen in ED 4/20 for c/o SOB and chest heaviness , UDS +ve amphetamines- refused admission. Uncontrolled BP on arrival in the setting of medication noncompliance and amphetamine abuse..   Admitted for hypertensive urgency in the setting of amphetamine use and possibly causing elevated troponin/chest discomfort.  Cardiology evaluated patient in ED and recommended admission with IV lasix 40 BID, metoprolol, ACEI -repeat echo, Milrinone if Urine output inadequate.   Patient sitting on the edge of the bed and appears comfortable.  Lungs Saturating well on room air.  Lungs clear on exam, trace to 1+ pitting edema in bilateral lower extremities.  Reports good diuresis with IV diuretics but I's and O's only showed -700 mL.  Seen by cardiology for follow-up and recommended to increase Lasix to 80 mg IV twice daily, lisinopril to 40 mg daily.  Toprol-XL 25 mg has been started.  Plan for repeat echo today. No need for IV heparin as felt to be demand ischemia per cardiology note-rec continue aspirin, beta-blockers and statins.

## 2020-08-09 NOTE — Progress Notes (Signed)
This RN was informed by day shift RN that patient had cigarettes in his pocket that have not been confiscated. Security paged and confiscated cigarettes from patient. Patient told security that he threw the matches away. Patient label placed on cigarettes and placed in patient chart. Will continue to monitor.

## 2020-08-09 NOTE — H&P (Signed)
History and Physical    Francisco [name] ZOX:[id number] DOB: 1961/02/06 DOA: 08/08/2020  PCP: Patient, No Pcp Per (Inactive)   Patient coming from:  Home  Chief Complaint: Chest pressure and SOB  HPI: Francisco [name] is a 60 y.o. male with medical history significant for CAD, s/p cardiac stents, COPD, HTN, tobacco use who presents for evaluation of Shortness of breath and chest pressure. He was seen a few days ago in the Er for similar symptoms and was diagnosed with acute CHF exacerbation at that time but he did not want to stay in the hospital and so went home with prescription for Lasix.  He reports been taking Lasix for the last few days but has continued to have edema of his legs and worsening shortness of breath.  Shortness of breath is exacerbated by any activity or walking and will improve some with rest but never gets back to his baseline level.  He also reports a substernal left-sided chest pressure like "something is sitting on me".  He states that the chest pressure is not painful or radiate to his neck, shoulders or down his arm.  Reports he has not had any nausea or vomiting with the symptoms.  He does have a history of IV drug use with heroin but states he no longer uses it but he has used methamphetamine in the last week.  When he was seen on April 20 the emergency room he had elevated BNP 940.  He reports he has been out of his medications for the last 2 to 3 months.  He states he has been taking Lasix for the last few days since he was given a prescription when he was in the emergency room on April 20.  He uses albuterol inhaler multiple times a day for the shortness of breath but is not helping any longer.  Reports he continues to smoke half a pack of cigarettes a day.  He reports this is down from 2 packs of cigarettes a day  ED Course:   In the emergency room patient is found to have elevated troponin of 99.  His troponin on April 20 was 124.  EKG today shows sinus tachycardia with  nonspecific lateral ST changes but no acute STEMI noted.  He has elevated blood pressure. BNP is 961.  He has edema of his legs.  He was given dose of Lasix in the emergency room as well as aspirin and lisinopril.  Cardiology was consulted and will see patient in the emergency room.  Cardiology felt that the troponin was due to ischemic demand and did not feel patient needed to be placed on a heparin at this time.  Review of Systems:  General: Reports fatigue and decreased energy level. Denies fever, chills, weight loss, night sweats.  Denies dizziness.  Denies change in appetite HENT: Denies head trauma, headache, denies change in hearing, tinnitus.  Denies nasal congestion or bleeding.  Denies sore throat, sores in mouth.  Denies difficulty swallowing Eyes: Denies blurry vision, pain in eye, drainage.  Denies discoloration of eyes. Neck: Denies pain.  Denies swelling.  Denies pain with movement. Cardiovascular: reports substernal chest pressure. Has edema of legs. Denies palpitations. Denies orthopnea Respiratory: Reports shortness of breath that is worsened with exertion. Denies cough. Denies sputum production Gastrointestinal: Denies abdominal pain, swelling.  Denies nausea, vomiting, diarrhea.  Denies melena.  Denies hematemesis. Musculoskeletal: Denies limitation of movement.  Denies deformity or swelling.  Denies pain.  Denies arthralgias or myalgias. Genitourinary: Denies pelvic pain.  Denies urinary frequency or hesitancy.  Denies dysuria.  Skin: Denies rash.  Denies petechiae, purpura, ecchymosis. Neurological: Denies syncope.  Denies seizure activity.  Denies paresthesia.  Denies slurred speech, drooping face.  Denies visual change. Psychiatric: Denies depression, anxiety.  Denies hallucinations.  Past Medical History:  Diagnosis Date  . CHF (congestive heart failure) (HCC)   . COPD (chronic obstructive pulmonary disease) (HCC)   . Coronary artery disease    a. s/p prior PCI.  Marland Kitchen  Hepatitis-C   . Hypertension   . IV drug abuse (HCC)    a. 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  . Medically noncompliant   . MI (myocardial infarction) (HCC)   . Tobacco abuse     Past Surgical History:  Procedure Laterality Date  . BACK SURGERY    . NECK SURGERY    . RIGHT/LEFT HEART CATH AND CORONARY ANGIOGRAPHY N/A 04/30/2019   Procedure: RIGHT/LEFT HEART CATH AND CORONARY ANGIOGRAPHY;  Surgeon: Marykay Lex, MD;  Location: [facility] INVASIVE CV LAB;  Service: Cardiovascular;  Laterality: N/A;  . stints      Social History  reports that he has been smoking cigarettes. He has been smoking about 0.75 packs per day. He has never used smokeless tobacco. He reports current drug use. Drug: Methamphetamines. He reports that he does not drink alcohol.  Allergies  Allergen Reactions  . Ivp Dye [Iodinated Diagnostic Agents] Other (See Comments)    Seizures   . Tramadol Other (See Comments)    Seizures     Family History  Problem Relation Age of Onset  . Rheum arthritis Mother      Prior to Admission medications   Medication Sig Start Date End Date Taking? Authorizing Provider  albuterol (PROVENTIL) (2.5 MG/3ML) 0.083% nebulizer solution Take 3 mLs (2.5 mg total) by nebulization every 6 (six) hours as needed for wheezing or shortness of breath. 05/10/20  Yes Pollina, Canary Brim, MD  albuterol (VENTOLIN HFA) 108 (90 Base) MCG/ACT inhaler Inhale 1-2 puffs into the lungs every 6 (six) hours as needed for wheezing or shortness of breath. 05/06/20  Yes Lorelee New, PA-C  aspirin EC 81 MG tablet Take 1 tablet (81 mg total) by mouth daily. 01/04/20  Yes Tilden Fossa, MD  furosemide (LASIX) 40 MG tablet Take 1 tablet (40 mg total) by mouth daily. 01/17/20 02/16/20 Yes McClung, Marzella Schlein, PA-C  ibuprofen (ADVIL) 200 MG tablet Take 400 mg by mouth every 6 (six) hours as needed for moderate pain.   Yes [provider]  lisinopril (ZESTRIL) 20 MG tablet Take 1 tablet  (20 mg total) by mouth daily. 08/06/20  Yes Little, Ambrose Finland, MD  nitroGLYCERIN (NITROSTAT) 0.4 MG SL tablet Place 1 tablet (0.4 mg total) under the tongue every 5 (five) minutes x 3 doses as needed for chest pain. 08/20/19  Yes Kendell Bane, MD    Physical Exam: Vitals:   08/08/20 2244 08/08/20 2251 08/08/20 2307 08/08/20 2330  BP:  (!) 157/123 (!) 164/129 (!) 172/115  Pulse: (!) 108  (!) 111 (!) 115  Resp: 18  (!) 22 (!) 25  Temp:      SpO2: 97%  98% 97%  Weight:      Height:        Constitutional: NAD, calm, comfortable Vitals:   08/08/20 2244 08/08/20 2251 08/08/20 2307 08/08/20 2330  BP:  (!) 157/123 (!) 164/129 (!) 172/115  Pulse: (!) 108  (!) 111 (!) 115  Resp: 18  (!) 22 (!)  25  Temp:      SpO2: 97%  98% 97%  Weight:      Height:       General: WDWN, Alert and oriented x3. Diaphoretic Eyes: EOMI, PERRL, conjunctivae normal.  Sclera nonicteric HENT:  [place]/AT, external ears normal.  Nares patent without epistasis.  Mucous membranes are moist. Posterior pharynx clear of any exudate or lesions. Neck: Soft, normal range of motion, supple, no masses, no thyromegaly. Trachea midline Respiratory: Equal but diminished breath sounds with bibasilar crackles and diffuse rales. Has expiratory wheezing,  Normal respiratory effort. No accessory muscle use.  Cardiovascular: Regular rate and rhythm, no murmurs / rubs / gallops. Has +2 lower extremity edema. 2+ pedal pulses.  Abdomen: Soft, no tenderness, nondistended, no rebound or guarding. No masses palpated. Bowel sounds normoactive Musculoskeletal: FROM. Has clubbing of digits. No cyanosis. No joint deformity upper and lower extremities. Normal muscle tone.  Skin: Warm, dry, intact no rashes, lesions, ulcers. No induration Neurologic: CN 2-12 grossly intact.  Normal speech.  Sensation intact, Strength 5/5 in all extremities.   Psychiatric: Normal judgment and insight.  Normal mood.    Labs on Admission: I have personally  reviewed following labs and imaging studies  CBC: Recent Labs  Lab 08/02/20 0041 08/06/20 0844 08/06/20 0856 08/08/20 2145  WBC 11.6* 15.9*  --  12.9*  NEUTROABS 7.9* 10.6*  --  7.5  HGB 15.2 14.6 15.0 16.2  HCT 45.9 44.7 44.0 49.3  MCV 96.6 99.1  --  97.8  PLT 248 310  --  351    Basic Metabolic Panel: Recent Labs  Lab 08/02/20 0041 08/06/20 0844 08/06/20 0856 08/08/20 2145  NA 139 140 138 139  K 4.1 3.3* 3.1* 4.1  CL 99 101  --  94*  CO2 32 27  --  39*  GLUCOSE 83 91  --  116*  BUN 10 18  --  15  CREATININE 0.92 1.05  --  1.15  CALCIUM 9.6 9.0  --  9.6    GFR: Estimated Creatinine Clearance: 75.9 mL/min (by C-G formula based on SCr of 1.15 mg/dL).  Liver Function Tests: Recent Labs  Lab 08/06/20 0844 08/08/20 2145  AST 26 44*  ALT 48* 73*  ALKPHOS 67 83  BILITOT 0.6 0.7  PROT 6.4* 6.6  ALBUMIN 3.4* 3.6    Urine analysis:    Component Value Date/Time   COLORURINE AMBER (A) 08/18/2019 1320   APPEARANCEUR CLOUDY (A) 08/18/2019 1320   LABSPEC 1.017 08/18/2019 1320   PHURINE 5.0 08/18/2019 1320   GLUCOSEU NEGATIVE 08/18/2019 1320   HGBUR SMALL (A) 08/18/2019 1320   BILIRUBINUR NEGATIVE 08/18/2019 1320   KETONESUR NEGATIVE 08/18/2019 1320   PROTEINUR 100 (A) 08/18/2019 1320   NITRITE NEGATIVE 08/18/2019 1320   LEUKOCYTESUR NEGATIVE 08/18/2019 1320    Radiological Exams on Admission: DG Chest 2 View  Result Date: 08/08/2020 CLINICAL DATA:  Chest pain, shortness of breath, and lower extremity swelling. EXAM: CHEST - 2 VIEW COMPARISON:  08/06/2020 FINDINGS: The heart size and mediastinal contours are within normal limits. Both lungs are clear. Cervical spine fusion hardware again noted. IMPRESSION: No active cardiopulmonary disease. Electronically Signed   By: Danae Orleans M.D.   On: 08/08/2020 21:58    EKG: Independently reviewed.  EKG shows sinus tachycardia with lateral nonspecific ST changes.  No STEMI identified.  QTc  497  Assessment/Plan Active Problems:   Acute on chronic heart failure with preserved ejection fraction (HFpEF)  Francisco [name] is admitted  to Cardiac telemetry floor. Diuresis with lasix IV bid for next few days. Monitor I&Os, daily weights.  Check Echocardiogram in am to evaluate wall motion, EF and valve function.  Check serial troponin levels as ischemia is leading cause of CHF exacerbation.  Cardiology is following and recommends adding milirone if urine output is not adequate.  Started on metoprolol and lisinopril.     NSTEMI (non-ST elevated myocardial infarction) Pt with elevated troponin levels and is very diaphoretic with chest pressure. Given a NTG in ER.  Cardiology has seen pt and does not recommend heparin infusion at this time.  Check serial troponin levels.  Aspirin daily.  Check lipid panel. Start statin with lipitor.    COPD exacerbation  Breo daily. Incentive parameter every 2 hours while awake.  DuoNebs every 4 hours as needed for shortness of breath, cough, wheeze    Hypertensive urgency Given lisinopril and metoprolol emergency room.  Monitor blood pressure.    Troponin I above reference range Check serial troponins.    CAD S/P percutaneous coronary angioplasty Since last catheterization was in January 2021 which showed moderate in-stent stenosis. Cardiology following    Prolonged QT interval Avoid medications which could further prolong QT interval    DVT prophylaxis: Pt is started on heparin infusion for ACS/NSTEMI Code Status:   Full code  Family Communication:  Diagnosis and plan discussed with patient.  Patient verbalized understanding and agrees with plan.  Further recommendations to follow as clinical indicated Disposition Plan:   Patient is from:  Home  Anticipated DC to:  Home  Anticipated DC date:  Anticipate at least 2 midnight stay in hospital to treat acute condition  Anticipated DC barriers: No barriers to discharge identified at this  time  Consults called:  Cardiology, consulted by ER physician  Admission status:  Inpatient   Claudean Severance Flossie Wexler MD [facility]  How to contact the [facility] Attending or Consulting provider 7A - 7P or covering provider during after hours 7P -7A, for this patient?   1. Check the care team in [facility] and look for a) attending/consulting [facility] provider listed and b) the [facility] team listed 2. Log into [email or web address] and use [place]'s universal password to access. If you do not have the password, please contact the hospital operator. 3. Locate the [facility] provider you are looking for under [facility] and page to a number that you can be directly reached. 4. If you still have difficulty reaching the provider, please page the [facility] (Director on Call) for the Hospitalists listed on amion for assistance.  08/09/2020, 12:26 AM

## 2020-08-09 NOTE — Progress Notes (Signed)
Progress Note  Patient Name: Francisco [name] Date of Encounter: 08/09/2020  [facility] Cardiologist: Peter Swaziland, MD   Subjective   Feeling better this am.    Inpatient Medications    Scheduled Meds: . aspirin EC  81 mg Oral Daily  . atorvastatin  40 mg Oral Daily  . diclofenac Sodium  2 g Topical QID  . fluticasone furoate-vilanterol  1 puff Inhalation Daily  . furosemide  40 mg Intravenous Q12H  . lisinopril  40 mg Oral Daily  . metoprolol succinate  25 mg Oral Daily  . potassium chloride  10 mEq Oral BID  . sodium chloride flush  3 mL Intravenous Q12H   Continuous Infusions: . sodium chloride     PRN Meds: sodium chloride, acetaminophen, ipratropium-albuterol, nitroGLYCERIN, oxyCODONE, sodium chloride flush   Vital Signs    Vitals:   08/09/20 0208 08/09/20 0300 08/09/20 0404 08/09/20 0747  BP: 114/87 (!) 149/81 (!) 134/98 (!) 134/98  Pulse: (!) 102 99  94  Resp: 20 16  16   Temp: 98 F (36.7 C) 98 F (36.7 C)  97.7 F (36.5 C)  TempSrc: Oral Oral  Oral  SpO2: 96% 92%  92%  Weight: 87.5 kg     Height: 6' (1.829 m)       Intake/Output Summary (Last 24 hours) at 08/09/2020 1026 Last data filed at 08/09/2020 0800 Gross per 24 hour  Intake 480 ml  Output 1225 ml  Net -745 ml   Last 3 Weights 08/09/2020 08/08/2020 08/06/2020  Weight (lbs) 192 lb 14.4 oz 179 lb 14.3 oz 180 lb  Weight (kg) 87.5 kg 81.6 kg 81.647 kg      Telemetry    NSR - Personally Reviewed  ECG    No new EKG to review - Personally Reviewed  Physical Exam   GEN: No acute distress.   Neck: No JVD Cardiac: RRR, no murmurs, rubs, or gallops.  Respiratory: Clear to auscultation bilaterally. GI: Soft, nontender, non-distended  MS: No edema; No deformity. Neuro:  Nonfocal  Psych: Normal affect   Labs    High Sensitivity Troponin:   Recent Labs  Lab 08/02/20 0328 08/06/20 0844 08/06/20 1112 08/08/20 2145 08/09/20 0002  TROPONINIHS 74* 109* 124* 99* 81*       Chemistry Recent Labs  Lab 08/06/20 0844 08/06/20 0856 08/08/20 2145  NA 140 138 139  K 3.3* 3.1* 4.1  CL 101  --  94*  CO2 27  --  39*  GLUCOSE 91  --  116*  BUN 18  --  15  CREATININE 1.05  --  1.15  CALCIUM 9.0  --  9.6  PROT 6.4*  --  6.6  ALBUMIN 3.4*  --  3.6  AST 26  --  44*  ALT 48*  --  73*  ALKPHOS 67  --  83  BILITOT 0.6  --  0.7  GFRNONAA >60  --  >60  ANIONGAP 12  --  6     Hematology Recent Labs  Lab 08/06/20 0844 08/06/20 0856 08/08/20 2145  WBC 15.9*  --  12.9*  RBC 4.51  --  5.04  HGB 14.6 15.0 16.2  HCT 44.7 44.0 49.3  MCV 99.1  --  97.8  MCH 32.4  --  32.1  MCHC 32.7  --  32.9  RDW 13.5  --  13.2  PLT 310  --  351    BNP Recent Labs  Lab 08/06/20 0844 08/08/20 2145  BNP 940.8* 961.3*  DDimer No results for input(s): DDIMER in the last 168 hours.    Radiology    DG Chest 2 View  Result Date: 08/08/2020 CLINICAL DATA:  Chest pain, shortness of breath, and lower extremity swelling. EXAM: CHEST - 2 VIEW COMPARISON:  08/06/2020 FINDINGS: The heart size and mediastinal contours are within normal limits. Both lungs are clear. Cervical spine fusion hardware again noted. IMPRESSION: No active cardiopulmonary disease. Electronically Signed   By: Danae Orleans M.D.   On: 08/08/2020 21:58    Cardiac Studies   Relevant CV Studies: ECHO: 08/18/2019 IMPRESSIONS  1. Low normal LV systolic function; mild LVH; grade 2 diastolic  dysfunction.  2. Left ventricular ejection fraction, by estimation, is 50 to 55%. The  left ventricle has low normal function. The left ventricle has no regional  wall motion abnormalities. There is mild left ventricular hypertrophy.  Left ventricular diastolic  parameters are consistent with Grade II diastolic dysfunction  (pseudonormalization).  3. Right ventricular systolic function is normal. The right ventricular  size is normal.  4. The mitral valve is normal in structure. Trivial mitral valve   regurgitation. No evidence of mitral stenosis.  5. The aortic valve is tricuspid. Aortic valve regurgitation is not  visualized. Mild aortic valve sclerosis is present, with no evidence of  aortic valve stenosis.  6. The inferior vena cava is normal in size with greater than 50%  respiratory variability, suggesting right atrial pressure of 3 mmHg.    Right and left heart cath 02/28/20:  Prox RCA to Mid RCA lesion is 50% stenosed - In-stent re-stenosis of distal 1/2 of stented segment (by report 3 overlapping stents). The remainder of the stent segment is widely patent.  Prox LAD lesion is 40% stenosed with 50% stenosed side branch in 1st Diag.  Mid Cx lesion is 25% stenosed.  Hemodynamic findings consistent with severe pulmonary hypertension.  LV end diastolic pressure is severely elevated.  SUMMARY  Moderate in-stent restenosis of the RCA with moderate bifurcation LAD-diagonal disease but no obstructive disease.  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  Have written for 80 mg of Lasix x1 today and then 40 mg IV twice daily starting tomorrow.  If urine output does not pick up, may want to consider milrinone.   Patient Profile     60 y.o. male with a hx of COPD, CAD status post PCI 5 years ago in [place], medication noncompliance, hep C not treated per notes, IVDU previously heroin now meth, tobacco use  who is being seen today for the evaluation of heart failure at the request of Dr Almeta Monas.   Assessment & Plan    1.  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 -SCr stable at 1.15 and K+ 4.1 -continue Lasix but  increase to 80mg  BID IV -repeat echo today -restarted on ACE I and dose increased to Lisinopril 40mg  daily -restarted on BB with Toprol XL 25mg  daily -Case manager and SW have been consulted to address social needs  2.  ASCAD/elevated Trop -s/p PCI 5 years ago in [place] -cath 02/2020 showed moderate in-stent restenosis of the RCA with moderate bifurcation LAD-diagonal disease but no obstructive disease. -mild CP and SOB on admit but likely related to acute CHF and hypertensive urgency -hsTrop mildly elevated at 124>99>81 -2D echo pending this am -Trop elevation c/w demand ischemia -continue ASA, statin and BB  3.  HLD -noncompliant with meds -LDL 98 on admit -restarted on statin  4.  Hypertensive urgency -BP improved after restarting BB and ACE I -he is noncompliant with meds -BP this am 134/82mmHg>>uptitrate meds as needed  I have spent a total of 35 minutes with patient reviewing 2D echo and cardiac cath , telemetry, EKGs, labs and examining patient as well as establishing an assessment and plan that was discussed with the patient.  > 50% of time was spent in direct patient care.       For questions or updates, please contact [facility] Please consult [email or web address] for contact info under        Signed, [place], MD  08/09/2020, 10:26 AM

## 2020-08-09 NOTE — Plan of Care (Signed)

## 2020-08-09 NOTE — Evaluation (Addendum)
Physical Therapy Evaluation & Discharge Patient Details Name: Francisco [name] MRN: [id number] DOB: 02-03-1961 Today's Date: 08/09/2020   History of Present Illness  Pt is a 60 y.o. male admitted 08/08/20 with c/o SOB, BLE edema, chest pressure; pt recently seen in ED (08/06/20) for this with workup for CHF exacerbation, but did not want to stay, sent home with Lasix. Workup for hypertensive urgency, acute on chronic HF; per cardiology, elevated troponins from demand ischemia. PMH includes CHF, COPD, CAD s/p PCI, HTN, MI, Hep-C, tobacco use, IVDA (previously heroin, now methamphetamine).    Clinical Impression  Patient evaluated by Physical Therapy with no further acute PT needs identified. PTA, pt independent, works, lives in [place]. Today, pt independent with ambulation and ADLs; DOE up to 3/4 with ambulation. Educ pt re: activity recommendations, activity pacing. All education has been completed and the patient has no further questions. Acute PT is signing off. Thank you for this referral.  Difficulty getting reliable pulse ox reading with activity (RN aware) SpO2 92% on RA with seated rest when pleth reliable Unsure if SpO2 dropped with ambulation; pt DOE 3/4    Follow Up Recommendations No PT follow up    Equipment Recommendations  None recommended by PT    Recommendations for Other Services       Precautions / Restrictions Precautions Precautions: Other (comment) Precaution Comments: Unable to get reliable pulse ox reading via finger or ear probe (RN aware) Restrictions Weight Bearing Restrictions: No      Mobility  Bed Mobility Overal bed mobility: Independent                  Transfers Overall transfer level: Independent                  Ambulation/Gait Ambulation/Gait assistance: Independent Gait Distance (Feet): 250 Feet Assistive device: None Gait Pattern/deviations: WFL(Within Functional Limits)     General Gait Details: Steady ambulation, independent  without DME; 1x standing rest break secondary to DOE 3/4; unable to get reliable pulse ox reading during ambulation via finger or ear probe  Stairs            Wheelchair Mobility    Modified Rankin (Stroke Patients Only)       Balance Overall balance assessment: Independent                                           Pertinent Vitals/Pain Pain Assessment: No/denies pain    Home Living Family/patient expects to be discharged to:: Other (Comment) ([employer]) Living Arrangements: Alone               Additional Comments: Does not have to negotiate stairs    Prior Function Level of Independence: Independent         Comments: 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)     Hand Dominance        Extremity/Trunk Assessment   Upper Extremity Assessment Upper Extremity Assessment: Overall WFL for tasks assessed    Lower Extremity Assessment Lower Extremity Assessment: Overall WFL for tasks assessed    Cervical / Trunk Assessment Cervical / Trunk Assessment: Normal  Communication   Communication: No difficulties  Cognition Arousal/Alertness: Awake/alert Behavior During Therapy: WFL for tasks assessed/performed Overall Cognitive Status: Within Functional Limits for tasks assessed  General Comments General comments (skin integrity, edema, etc.): Difficulty getting reliable pulse ox reading with ambulation. SpO2 reading 92% on RA upon seated rest in room with reliable pleth    Exercises     Assessment/Plan    PT Assessment Patent does not need any further PT services  PT Problem List         PT Treatment Interventions      PT Goals (Current goals can be found in the Care Plan section)  Acute Rehab PT Goals PT Goal Formulation: All assessment and education complete, DC therapy    Frequency     Barriers to discharge        Co-evaluation                AM-PAC PT "6 Clicks" Mobility  Outcome Measure Help needed turning from your back to your side while in a flat bed without using bedrails?: None Help needed moving from lying on your back to sitting on the side of a flat bed without using bedrails?: None Help needed moving to and from a bed to a chair (including a wheelchair)?: None Help needed standing up from a chair using your arms (e.g., wheelchair or bedside chair)?: None Help needed to walk in hospital room?: None Help needed climbing 3-5 steps with a railing? : None 6 Click Score: 24    End of Session   Activity Tolerance: Patient tolerated treatment well Patient left: in bed;with call bell/phone within reach Nurse Communication: Mobility status PT Visit Diagnosis: Other abnormalities of gait and mobility (R26.89)    Time: 8144-8185 PT Time Calculation (min) (ACUTE ONLY): 18 min   Charges:   PT Evaluation $PT Eval Low Complexity: 1 Low     Ina Homes, PT, DPT Acute Rehabilitation Services  Pager [phone] Office [phone]  Malachy Chamber 08/09/2020, 2:43 PM

## 2020-08-10 DIAGNOSIS — J441 Chronic obstructive pulmonary disease with (acute) exacerbation: Secondary | ICD-10-CM

## 2020-08-10 DIAGNOSIS — R9431 Abnormal electrocardiogram [ECG] [EKG]: Secondary | ICD-10-CM

## 2020-08-10 DIAGNOSIS — F1910 Other psychoactive substance abuse, uncomplicated: Secondary | ICD-10-CM

## 2020-08-10 DIAGNOSIS — I5043 Acute on chronic combined systolic (congestive) and diastolic (congestive) heart failure: Secondary | ICD-10-CM

## 2020-08-10 LAB — BASIC METABOLIC PANEL
Anion gap: 11 (ref 5–15)
BUN: 19 mg/dL (ref 6–20)
CO2: 30 mmol/L (ref 22–32)
Calcium: 9 mg/dL (ref 8.9–10.3)
Chloride: 96 mmol/L — ABNORMAL LOW (ref 98–111)
Creatinine, Ser: 1.11 mg/dL (ref 0.61–1.24)
GFR, Estimated: >60 mL/min (ref >60)
Glucose, Bld: 111 mg/dL — ABNORMAL HIGH (ref 70–99)
Potassium: 3.6 mmol/L (ref 3.5–5.1)
Sodium: 137 mmol/L (ref 135–145)

## 2020-08-10 MED ORDER — ATORVASTATIN CALCIUM 40 MG PO TABS: 40.0000 mg | ORAL_TABLET | Freq: Every day | ORAL | 2 refills | Status: DC

## 2020-08-10 MED ORDER — METOPROLOL SUCCINATE ER 25 MG PO TB24: 25.0000 mg | ORAL_TABLET | Freq: Every day | ORAL | 2 refills | Status: DC

## 2020-08-10 MED ORDER — POTASSIUM CHLORIDE CRYS ER 10 MEQ PO TBCR: 10.0000 meq | EXTENDED_RELEASE_TABLET | Freq: Two times a day (BID) | ORAL | 1 refills | Status: DC

## 2020-08-10 MED ORDER — NITROGLYCERIN 0.4 MG SL SUBL: 0.4000 mg | SUBLINGUAL_TABLET | SUBLINGUAL | 3 refills | Status: DC | PRN

## 2020-08-10 MED ORDER — FUROSEMIDE 40 MG PO TABS: 40.0000 mg | ORAL_TABLET | Freq: Two times a day (BID) | ORAL | 1 refills | Status: DC

## 2020-08-10 MED ORDER — LISINOPRIL 20 MG PO TABS: 40.0000 mg | ORAL_TABLET | Freq: Every day | ORAL | 0 refills | Status: DC

## 2020-08-10 NOTE — Discharge Summary (Addendum)
Physician Discharge Summary  Juwuan Sedita GMW:[id number] DOB: Jul 01, 1960 DOA: 08/08/2020  PCP: Patient, No Pcp Per (Inactive)  Admit date: 08/08/2020 Discharge date: 08/10/2020 Consultations: [facility] cardiology Admitted From: Home Disposition: Home  Discharge Diagnoses:  Principal Problem:   Acute on chronic combined systolic and diastolic CHF (congestive heart failure) (HCC) Active Problems:   Hypertensive urgency   Elevated troponin due to demand ischemia   Prolonged QT interval    Polysubstance abuse (HCC)   Troponin I above reference range   CAD S/P percutaneous coronary angioplasty   COPD exacerbation (HCC)   Acute on chronic heart failure with preserved ejection fraction (HFpEF) [facility])   Hospital Course Summary:60 y/o with COPD, CAD status post PCI 5 years ago in [place] cath 11/21 showing moderate in stent stenosis, medication noncompliance, hep C not treated per notes, IVDU previously heroin now meth, tobacco use presenting to the hospital with c/o SOB,orthopnea, leg swellings and constant chest pressure. HR 117, RR 22, BP 207/130-> 164/129. BNP 961, trop 99 (Down from 124 on 4/20 ED visit). LFTs up, EKG sinus tachy, Qtc 497 ms. Recent d/c ed 4/16 after CHF on lasix po and seen in ED 4/20 for c/o SOB and chest heaviness , UDS +ve amphetamines- refused admission. Uncontrolled BP on arrival in the setting of medication noncompliance and ongoing tobacco/amphetamine abuse.  Patient was admitted on the night of 08/08/2020 for evaluation and management of hypertensive urgency in the setting of amphetamine use and possibly causing elevated troponin/chest discomfort.  Cardiology evaluated patient in ED and recommended admission with IV lasix 40 BID, metoprolol, ACEI -repeat echo, Milrinone if Urine output inadequate.  Patient was evaluated by cardiology on 4/23 and Lasix dosage was increased to 80 mg IV twice daily with close monitoring of I's and O's.  Echocardiogram was repeated which  showed new onset systolic dysfunction with EF dropped to 30-35%.  His medications were optimized by increasing lisinopril to 40 mg daily, addition of Toprol-XL 25 mg.Cardiology felt elevated troponin could be related to demand ischemia in the setting of uncontrolled blood pressure and IV heparin was not initiated.  Patient was continued on aspirin, beta-blockers and statins.  Upon follow-up today, patient feels much improved.  Saturating well on room air.  Blood pressure within normal range.  He has been diuresing well and weight down by 5 pounds.  He feels ready to go home and has also been upset with nursing staff as a pack of cigarettes were confiscated from him overnight.  Had extensive discussion with patient regarding worsening stent thrombosis if continues to smoke and worsening cardiomyopathy with amphetamine abuse.  He verbalized understanding-he was seen by cardiology for follow-up-he refused further inpatient testing including stress test or repeat cardiac cath.  Cardiology cleared him for discharge on aspirin, Lasix 40 mg twice daily with potassium supplementation, ACE inhibitor's, beta-blockers, statins and recommended follow-up with Dr. Swaziland as outpatient for further discussion.  Discharge Exam:   Vitals:   08/10/20 0352 08/10/20 0726 08/10/20 0926 08/10/20 1105  BP: 121/84 (!) 144/88  (!) 145/92  Pulse: 84 84  93  Resp: 17 20  17   Temp: 97.9 F (36.6 C) 98 F (36.7 C)  97.7 F (36.5 C)  TempSrc: Oral Oral  Oral  SpO2: 93% 94% 95% 93%  Weight: 85.7 kg     Height:        General: Pt is alert, awake, not in acute distress Cardiovascular: RRR, S1/S2 +, no rubs, no gallops Respiratory: CTA bilaterally, no wheezing, no  rhonchi Abdominal: Soft, NT, ND, bowel sounds + Extremities: no edema, no cyanosis  Discharge Condition:Stable CODE STATUS: Full code Diet recommendation: Low-salt, fluid restriction, cardiac diet Recommendations for Outpatient Follow-up:  1. Follow up with  PCP: 1 week 2. Follow up with consultants: Cardiology-Dr. Swaziland in 2 weeks 3. Please obtain follow up labs including: BMP for renal function/potassium levels as on increased dose of lisinopril, potassium supplementation  Home Health services upon discharge: None Equipment/Devices upon discharge: None   Discharge Instructions:  Discharge Instructions    (HEART FAILURE PATIENTS) Call MD:  Anytime you have any of the following symptoms: 1) 3 pound weight gain in 24 hours or 5 pounds in 1 week 2) shortness of breath, with or without a dry hacking cough 3) swelling in the hands, feet or stomach 4) if you have to sleep on extra pillows at night in order to breathe.   Complete by: As directed    AMB referral to CHF clinic   Complete by: As directed    Call MD for:  difficulty breathing, headache or visual disturbances   Complete by: As directed    Call MD for:  extreme fatigue   Complete by: As directed    Call MD for:  severe uncontrolled pain   Complete by: As directed    Call MD for:  temperature >100.4   Complete by: As directed    Diet - low sodium heart healthy   Complete by: As directed    Fluid restriction to 1.5 lit/day   Discharge instructions   Complete by: As directed    Follow up PCP in 7-10 days, you will need repeat labs to check your kidney function and potassium level as you are not medications that can alter these labs values. Follow up Cardiology Dr Swaziland in 2 weeks to discuss further course of action regarding your heart dysfunction Advise to strictly stop smoking and using recreational drugs due to their detrimental effect on clotting your stent and worsening your heart function.   Increase activity slowly   Complete by: As directed      Allergies as of 08/10/2020      Reactions   Ivp Dye [iodinated Diagnostic Agents] Other (See Comments)   Seizures   Tramadol Other (See Comments)   Seizures      Medication List    STOP taking these medications   ibuprofen  200 MG tablet Commonly known as: ADVIL     TAKE these medications   albuterol 108 (90 Base) MCG/ACT inhaler Commonly known as: VENTOLIN HFA Inhale 1-2 puffs into the lungs every 6 (six) hours as needed for wheezing or shortness of breath.   albuterol (2.5 MG/3ML) 0.083% nebulizer solution Commonly known as: PROVENTIL Take 3 mLs (2.5 mg total) by nebulization every 6 (six) hours as needed for wheezing or shortness of breath.   aspirin EC 81 MG tablet Take 1 tablet (81 mg total) by mouth daily.   atorvastatin 40 MG tablet Commonly known as: LIPITOR Take 1 tablet (40 mg total) by mouth daily. Start taking on: August 11, 2020   furosemide 40 MG tablet Commonly known as: Lasix Take 1 tablet (40 mg total) by mouth 2 (two) times daily. What changed: when to take this   lisinopril 20 MG tablet Commonly known as: ZESTRIL Take 2 tablets (40 mg total) by mouth daily. What changed: how much to take   metoprolol succinate 25 MG 24 hr tablet Commonly known as: TOPROL-XL Take 1 tablet (25 mg  total) by mouth daily. Start taking on: August 11, 2020   nitroGLYCERIN 0.4 MG SL tablet Commonly known as: NITROSTAT Place 1 tablet (0.4 mg total) under the tongue every 5 (five) minutes x 3 doses as needed for chest pain.   potassium chloride 10 MEQ tablet Commonly known as: KLOR-CON Take 1 tablet (10 mEq total) by mouth 2 (two) times daily.       Allergies  Allergen Reactions  . Ivp Dye [Iodinated Diagnostic Agents] Other (See Comments)    Seizures   . Tramadol Other (See Comments)    Seizures       The results of significant diagnostics from this hospitalization (including imaging, microbiology, ancillary and laboratory) are listed below for reference.    Labs: BNP (last 3 results) Recent Labs    08/02/20 0141 08/06/20 0844 08/08/20 2145  BNP 1,204.8* 940.8* 961.3*   Basic Metabolic Panel: Recent Labs  Lab 08/06/20 0844 08/06/20 0856 08/08/20 2145 08/10/20 0018  NA  140 138 139 137  K 3.3* 3.1* 4.1 3.6  CL 101  --  94* 96*  CO2 27  --  39* 30  GLUCOSE 91  --  116* 111*  BUN 18  --  15 19  CREATININE 1.05  --  1.15 1.11  CALCIUM 9.0  --  9.6 9.0   Liver Function Tests: Recent Labs  Lab 08/06/20 0844 08/08/20 2145  AST 26 44*  ALT 48* 73*  ALKPHOS 67 83  BILITOT 0.6 0.7  PROT 6.4* 6.6  ALBUMIN 3.4* 3.6   No results for input(s): LIPASE, AMYLASE in the last 168 hours. No results for input(s): AMMONIA in the last 168 hours. CBC: Recent Labs  Lab 08/06/20 0844 08/06/20 0856 08/08/20 2145  WBC 15.9*  --  12.9*  NEUTROABS 10.6*  --  7.5  HGB 14.6 15.0 16.2  HCT 44.7 44.0 49.3  MCV 99.1  --  97.8  PLT 310  --  351   Cardiac Enzymes: No results for input(s): CKTOTAL, CKMB, CKMBINDEX, TROPONINI in the last 168 hours. BNP: Invalid input(s): POCBNP CBG: No results for input(s): GLUCAP in the last 168 hours. D-Dimer No results for input(s): DDIMER in the last 72 hours. Hgb A1c No results for input(s): HGBA1C in the last 72 hours. Lipid Profile Recent Labs    08/09/20 0552  CHOL 188  HDL 71  LDLCALC 98  TRIG 95  CHOLHDL 2.6   Thyroid function studies No results for input(s): TSH, T4TOTAL, T3FREE, THYROIDAB in the last 72 hours.  Invalid input(s): FREET3 Anemia work up No results for input(s): VITAMINB12, FOLATE, FERRITIN, TIBC, IRON, RETICCTPCT in the last 72 hours. Urinalysis    Component Value Date/Time   COLORURINE AMBER (A) 08/18/2019 1320   APPEARANCEUR CLOUDY (A) 08/18/2019 1320   LABSPEC 1.017 08/18/2019 1320   PHURINE 5.0 08/18/2019 1320   GLUCOSEU NEGATIVE 08/18/2019 1320   HGBUR SMALL (A) 08/18/2019 1320   BILIRUBINUR NEGATIVE 08/18/2019 1320   KETONESUR NEGATIVE 08/18/2019 1320   PROTEINUR 100 (A) 08/18/2019 1320   NITRITE NEGATIVE 08/18/2019 1320   LEUKOCYTESUR NEGATIVE 08/18/2019 1320   Sepsis Labs Invalid input(s): PROCALCITONIN,  WBC,  LACTICIDVEN Microbiology Recent Results (from the past 240  hour(s))  Resp Panel by RT-PCR (Flu A&B, Covid) Nasopharyngeal Swab     Status: None   Collection Time: 08/02/20  3:18 AM   Specimen: Nasopharyngeal Swab; Nasopharyngeal(NP) swabs in vial transport medium  Result Value Ref Range Status   SARS Coronavirus 2 by RT  PCR NEGATIVE NEGATIVE Final    Comment: (NOTE) SARS-CoV-2 target nucleic acids are NOT DETECTED.  The SARS-CoV-2 RNA is generally detectable in upper respiratory specimens during the acute phase of infection. The lowest concentration of SARS-CoV-2 viral copies this assay can detect is 138 copies/mL. A negative result does not preclude SARS-Cov-2 infection and should not be used as the sole basis for treatment or other patient management decisions. A negative result may occur with  improper specimen collection/handling, submission of specimen other than nasopharyngeal swab, presence of viral mutation(s) within the areas targeted by this assay, and inadequate number of viral copies(<138 copies/mL). A negative result must be combined with clinical observations, patient history, and epidemiological information. The expected result is Negative.  Fact Sheet for Patients:  BloggerCourse.com  Fact Sheet for Healthcare Providers:  SeriousBroker.it  This test is no t yet approved or cleared by the [place] FDA and  has been authorized for detection and/or diagnosis of SARS-CoV-2 by FDA under an Emergency Use Authorization (EUA). This EUA will remain  in effect (meaning this test can be used) for the duration of the COVID-19 declaration under Section 564(b)(1) of the Act, 21 U.S.C.section 360bbb-3(b)(1), unless the authorization is terminated  or revoked sooner.       Influenza A by PCR NEGATIVE NEGATIVE Final   Influenza B by PCR NEGATIVE NEGATIVE Final    Comment: (NOTE) The Xpert Xpress SARS-CoV-2/FLU/RSV plus assay is intended as an aid in the diagnosis of influenza from  Nasopharyngeal swab specimens and should not be used as a sole basis for treatment. Nasal washings and aspirates are unacceptable for Xpert Xpress SARS-CoV-2/FLU/RSV testing.  Fact Sheet for Patients: BloggerCourse.com  Fact Sheet for Healthcare Providers: SeriousBroker.it  This test is not yet approved or cleared by the [place] FDA and has been authorized for detection and/or diagnosis of SARS-CoV-2 by FDA under an Emergency Use Authorization (EUA). This EUA will remain in effect (meaning this test can be used) for the duration of the COVID-19 declaration under Section 564(b)(1) of the Act, 21 U.S.C. section 360bbb-3(b)(1), unless the authorization is terminated or revoked.  Performed at [facility], [address], [place], [place] [address]   Resp Panel by RT-PCR (Flu A&B, Covid) Nasopharyngeal Swab     Status: None   Collection Time: 08/09/20 12:00 AM   Specimen: Nasopharyngeal Swab; Nasopharyngeal(NP) swabs in vial transport medium  Result Value Ref Range Status   SARS Coronavirus 2 by RT PCR NEGATIVE NEGATIVE Final    Comment: (NOTE) SARS-CoV-2 target nucleic acids are NOT DETECTED.  The SARS-CoV-2 RNA is generally detectable in upper respiratory specimens during the acute phase of infection. The lowest concentration of SARS-CoV-2 viral copies this assay can detect is 138 copies/mL. A negative result does not preclude SARS-Cov-2 infection and should not be used as the sole basis for treatment or other patient management decisions. A negative result may occur with  improper specimen collection/handling, submission of specimen other than nasopharyngeal swab, presence of viral mutation(s) within the areas targeted by this assay, and inadequate number of viral copies(<138 copies/mL). A negative result must be combined with clinical observations, patient history, and epidemiological information. The expected  result is Negative.  Fact Sheet for Patients:  BloggerCourse.com  Fact Sheet for Healthcare Providers:  SeriousBroker.it  This test is no t yet approved or cleared by the [place] FDA and  has been authorized for detection and/or diagnosis of SARS-CoV-2 by FDA under an Emergency Use Authorization (EUA).  This EUA will remain  in effect (meaning this test can be used) for the duration of the COVID-19 declaration under Section 564(b)(1) of the Act, 21 U.S.C.section 360bbb-3(b)(1), unless the authorization is terminated  or revoked sooner.       Influenza A by PCR NEGATIVE NEGATIVE Final   Influenza B by PCR NEGATIVE NEGATIVE Final    Comment: (NOTE) The Xpert Xpress SARS-CoV-2/FLU/RSV plus assay is intended as an aid in the diagnosis of influenza from Nasopharyngeal swab specimens and should not be used as a sole basis for treatment. Nasal washings and aspirates are unacceptable for Xpert Xpress SARS-CoV-2/FLU/RSV testing.  Fact Sheet for Patients: BloggerCourse.com  Fact Sheet for Healthcare Providers: SeriousBroker.it  This test is not yet approved or cleared by the [place] FDA and has been authorized for detection and/or diagnosis of SARS-CoV-2 by FDA under an Emergency Use Authorization (EUA). This EUA will remain in effect (meaning this test can be used) for the duration of the COVID-19 declaration under Section 564(b)(1) of the Act, 21 U.S.C. section 360bbb-3(b)(1), unless the authorization is terminated or revoked.  Performed at [facility], [address] [place], [place] [address]   MRSA PCR Screening     Status: None   Collection Time: 08/09/20  2:23 AM   Specimen: Nasal Mucosa; Nasopharyngeal  Result Value Ref Range Status   MRSA by PCR NEGATIVE NEGATIVE Final    Comment:        The GeneXpert MRSA Assay (FDA approved for NASAL  specimens only), is one component of a comprehensive MRSA colonization surveillance program. It is not intended to diagnose MRSA infection nor to guide or monitor treatment for MRSA infections. Performed at [facility], [address] [place], [place] [address]     Procedures/Studies: DG Chest 2 View  Result Date: 08/08/2020 CLINICAL DATA:  Chest pain, shortness of breath, and lower extremity swelling. EXAM: CHEST - 2 VIEW COMPARISON:  08/06/2020 FINDINGS: The heart size and mediastinal contours are within normal limits. Both lungs are clear. Cervical spine fusion hardware again noted. IMPRESSION: No active cardiopulmonary disease. Electronically Signed   By: Danae Orleans M.D.   On: 08/08/2020 21:58   DG Chest 2 View  Result Date: 08/02/2020 CLINICAL DATA:  Shortness of breath, history of CHF, COPD EXAM: CHEST - 2 VIEW COMPARISON:  Radiograph 07/12/2020, CT 11/04/2018 FINDINGS: Chronic hyperinflation and coarsened interstitial changes are similar to priors. No consolidation, features of edema, pneumothorax, or effusion. The cardiomediastinal contours are unremarkable. No acute osseous or soft tissue abnormality. Prior cervical fusion, incompletely assessed on this exam. IMPRESSION: Chronic hyperinflation and coarsened interstitial changes compatible with history of COPD. No acute cardiopulmonary findings. Electronically Signed   By: Kreg Shropshire M.D.   On: 08/02/2020 01:11   DG Chest 2 View  Result Date: 07/12/2020 CLINICAL DATA:  Chest pain, coughing last night, painful to breathe, pain radiates from anterior to posterior, history COPD, coronary artery disease post MI, smoker EXAM: CHEST - 2 VIEW COMPARISON:  05/09/2020, 05/06/2020 FINDINGS: Normal heart size, mediastinal contours, and pulmonary vascularity. Lungs hyperinflated but clear. No acute infiltrate, pleural effusion, or pneumothorax. Osseous demineralization with minimal anterior height loss of adjacent midthoracic vertebra.  Prior cervical spine fusion. IMPRESSION: Hyperinflated lungs. No acute abnormalities. Electronically Signed   By: Ulyses Southward M.D.   On: 07/12/2020 20:20   DG Chest Port 1 View  Result Date: 08/06/2020 CLINICAL DATA:  Shortness of breath. EXAM: PORTABLE CHEST 1 VIEW COMPARISON:  Chest x-ray 08/02/2020. FINDINGS: Mediastinum hilar structures normal. Cardiomegaly. No pulmonary venous congestion. No focal infiltrate. No pleural effusion or pneumothorax. No acute bony abnormality. IMPRESSION: 1.  Cardiomegaly.  No pulmonary venous congestion. 2.  No acute infiltrate. Electronically Signed   By: Maisie Fushomas  Register   On: 08/06/2020 09:17   ECHOCARDIOGRAM COMPLETE  Result Date: 08/09/2020    ECHOCARDIOGRAM REPORT   Patient Name:   Grafton FolkWAYNE Orantes Date of Exam: 08/09/2020 Medical Rec #:  [id number]   Height:       72.0 in Accession #:    [id number]  Weight:       192.9 lb Date of Birth:  [birth year]   BSA:          2.098 m Patient Age:    59 years    BP:           136/96 mmHg Patient Gender: M           HR:           90 bpm. Exam Location:  Inpatient Procedure: 2D Echo, Cardiac Doppler and Color Doppler Indications:    CHF  History:        Patient has prior history of Echocardiogram examinations, most                 recent 08/18/2019. CHF, CAD and Previous Myocardial Infarction,                 COPD; Risk Factors:Hypertension.  Sonographer:    Shirlean KellyJohn Mendel Schobert Referring Phys: [id number] BRADLEY S CHOTINER IMPRESSIONS  1. Left ventricular ejection fraction, by estimation, is 35 to 40%. The left ventricle has moderately decreased function. The left ventricle demonstrates global hypokinesis. There is severe left ventricular hypertrophy. Left ventricular diastolic parameters are consistent with Grade I diastolic dysfunction (impaired relaxation).  2. Right ventricular systolic function is normal. The right ventricular size is normal.  3. The mitral valve is normal in structure. No evidence of mitral valve regurgitation. No evidence  of mitral stenosis.  4. The aortic valve is tricuspid. Aortic valve regurgitation is not visualized. Mild to moderate aortic valve sclerosis/calcification is present, without any evidence of aortic stenosis.  5. The inferior vena cava is normal in size with greater than 50% respiratory variability, suggesting right atrial pressure of 3 mmHg. FINDINGS  Left Ventricle: Left ventricular ejection fraction, by estimation, is 35 to 40%. The left ventricle has moderately decreased function. The left ventricle demonstrates global hypokinesis. Definity contrast agent was given IV to delineate the left ventricular endocardial borders. The left ventricular internal cavity size was normal in size. There is severe left ventricular hypertrophy. Left ventricular diastolic parameters are consistent with Grade I diastolic dysfunction (impaired relaxation). Right Ventricle: The right ventricular size is normal.Right ventricular systolic function is normal. Left Atrium: Left atrial size was normal in size. Right Atrium: Right atrial size was normal in size. Pericardium: There is no evidence of pericardial effusion. Mitral Valve: The mitral valve is normal in structure. No evidence of mitral valve regurgitation. No evidence of mitral valve stenosis. Tricuspid Valve: The tricuspid valve is normal in structure. Tricuspid valve regurgitation is trivial. No evidence of tricuspid stenosis. Aortic Valve: The aortic valve is tricuspid. Aortic valve regurgitation is not visualized. Mild to moderate aortic valve sclerosis/calcification is present, without any evidence of aortic stenosis. Aortic valve mean gradient measures 4.0 mmHg. Aortic valve peak gradient measures 6.7 mmHg. Aortic valve area, by VTI measures 2.24 cm. Pulmonic Valve: The pulmonic valve  was normal in structure. Pulmonic valve regurgitation is trivial. No evidence of pulmonic stenosis. Aorta: The aortic root is normal in size and structure. Venous: The inferior vena cava is  normal in size with greater than 50% respiratory variability, suggesting right atrial pressure of 3 mmHg. IAS/Shunts: No atrial level shunt detected by color flow Doppler.  LEFT VENTRICLE PLAX 2D LVIDd:         4.30 cm  Diastology LVIDs:         3.60 cm  LV e' medial:    3.05 cm/s LV PW:         1.60 cm  LV E/e' medial:  21.8 LV IVS:        1.60 cm  LV e' lateral:   7.07 cm/s LVOT diam:     2.00 cm  LV E/e' lateral: 9.4 LV SV:         39 LV SV Index:   19 LVOT Area:     3.14 cm  RIGHT VENTRICLE             IVC RV Basal diam:  4.50 cm     IVC diam: 1.30 cm RV S prime:     11.20 cm/s TAPSE (M-mode): 1.9 cm LEFT ATRIUM             Index       RIGHT ATRIUM           Index LA diam:        2.80 cm 1.33 cm/m  RA Area:     19.80 cm LA Vol (A2C):   34.7 ml 16.54 ml/m RA Volume:   56.70 ml  27.02 ml/m LA Vol (A4C):   34.2 ml 16.30 ml/m LA Biplane Vol: 36.0 ml 17.16 ml/m  AORTIC VALVE AV Area (Vmax):    2.61 cm AV Area (Vmean):   2.52 cm AV Area (VTI):     2.24 cm AV Vmax:           129.00 cm/s AV Vmean:          92.800 cm/s AV VTI:            0.174 m AV Peak Grad:      6.7 mmHg AV Mean Grad:      4.0 mmHg LVOT Vmax:         107.00 cm/s LVOT Vmean:        74.500 cm/s LVOT VTI:          0.124 m LVOT/AV VTI ratio: 0.71  AORTA Ao Root diam: 3.00 cm Ao Asc diam:  2.70 cm MITRAL VALVE MV Area (PHT): 5.84 cm    SHUNTS MV Decel Time: 130 msec    Systemic VTI:  0.12 m MV E velocity: 66.40 cm/s  Systemic Diam: 2.00 cm MV A velocity: 76.30 cm/s MV E/A ratio:  0.87 Olga Millers MD Electronically signed by Olga Millers MD Signature Date/Time: 08/09/2020/2:53:21 PM    Final     Time coordinating discharge: Over 30 minutes  SIGNED:   Alessandra Bevels, MD  [facility] 08/10/2020, 12:34 PM

## 2020-08-10 NOTE — TOC Transition Note (Signed)
Transition of Care [facility]) - CM/SW Discharge Note   Patient Details  Name: Francisco [name] MRN: [id number] Date of Birth: [birth year]  [facility] CM/SW Contact:  Carles Collet, RN Phone Number: 08/10/2020, 1:07 PM   Clinical Narrative:   Met with patient at bedside. Provided with MATCh- reinstated. Provided w [facility] brochure w instructions to call to establish a PCP.          Patient Goals and CMS Choice        Discharge Placement                       Discharge Plan and Services                                     Social Determinants of Health (SDOH) Interventions     Readmission Risk Interventions Readmission Risk Prevention Plan 08/20/2019  Transportation Screening Complete  PCP or Specialist Appt within 3-5 Days Complete  HRI or [place] Complete  Social Work Consult for [place] Planning/Counseling Complete  Palliative Care Screening Not Applicable  Medication Review [employer] photographer) Complete

## 2020-08-10 NOTE — Progress Notes (Signed)
Pt got discharged to home, discharge instructions provided and patient showed understanding to it, IV taken out,Telemonitor DC,pt left unit in wheelchair with all of the belongings accompanied with a friend.  Chad Donoghue, RN 

## 2020-08-10 NOTE — Plan of Care (Signed)

## 2020-08-10 NOTE — Progress Notes (Addendum)
Progress Note  Patient Name: Francisco [name] Date of Encounter: 08/10/2020  [facility] Cardiologist: Peter Swaziland, MD   Subjective   Denies any chest pain or SOB.  Wants to go home - says he has to work tomorrow or he will be out on the streets  Inpatient Medications    Scheduled Meds: . aspirin EC  81 mg Oral Daily  . atorvastatin  40 mg Oral Daily  . diclofenac Sodium  2 g Topical QID  . enoxaparin (LOVENOX) injection  40 mg Subcutaneous Q24H  . fluticasone furoate-vilanterol  1 puff Inhalation Daily  . furosemide  80 mg Intravenous Q12H  . lisinopril  40 mg Oral Daily  . metoprolol succinate  25 mg Oral Daily  . potassium chloride  10 mEq Oral BID  . sodium chloride flush  3 mL Intravenous Q12H   Continuous Infusions: . sodium chloride     PRN Meds: sodium chloride, acetaminophen, ipratropium-albuterol, nitroGLYCERIN, oxyCODONE, sodium chloride flush   Vital Signs    Vitals:   08/09/20 2322 08/10/20 0352 08/10/20 0726 08/10/20 0926  BP: 112/89 121/84 (!) 144/88   Pulse: 87 84 84   Resp: 17 17 20    Temp: 97.9 F (36.6 C) 97.9 F (36.6 C) 98 F (36.7 C)   TempSrc: Oral Oral Oral   SpO2: 93% 93% 94% 95%  Weight:  85.7 kg    Height:        Intake/Output Summary (Last 24 hours) at 08/10/2020 1052 Last data filed at 08/10/2020 0800 Gross per 24 hour  Intake 1300 ml  Output 2130 ml  Net -830 ml   Last 3 Weights 08/10/2020 08/09/2020 08/08/2020  Weight (lbs) 189 lb 192 lb 14.4 oz 179 lb 14.3 oz  Weight (kg) 85.73 kg 87.5 kg 81.6 kg      Telemetry    NSR - Personally Reviewed  ECG    No new EKG to review - Personally Reviewed  Physical Exam   GEN: Well nourished, well developed in no acute distress HEENT: Normal NECK: No JVD; No carotid bruits LYMPHATICS: No lymphadenopathy CARDIAC:RRR, no murmurs, rubs, gallops RESPIRATORY:  Clear to auscultation without rales, wheezing or rhonchi  ABDOMEN: Soft, non-tender, non-distended MUSCULOSKELETAL:  No  edema; No deformity  SKIN: Warm and dry NEUROLOGIC:  Alert and oriented x 3 PSYCHIATRIC:  Normal affect    Labs    High Sensitivity Troponin:   Recent Labs  Lab 08/02/20 0328 08/06/20 0844 08/06/20 1112 08/08/20 2145 08/09/20 0002  TROPONINIHS 74* 109* 124* 99* 81*      Chemistry Recent Labs  Lab 08/06/20 0844 08/06/20 0856 08/08/20 2145 08/10/20 0018  NA 140 138 139 137  K 3.3* 3.1* 4.1 3.6  CL 101  --  94* 96*  CO2 27  --  39* 30  GLUCOSE 91  --  116* 111*  BUN 18  --  15 19  CREATININE 1.05  --  1.15 1.11  CALCIUM 9.0  --  9.6 9.0  PROT 6.4*  --  6.6  --   ALBUMIN 3.4*  --  3.6  --   AST 26  --  44*  --   ALT 48*  --  73*  --   ALKPHOS 67  --  83  --   BILITOT 0.6  --  0.7  --   GFRNONAA >60  --  >60 >60  ANIONGAP 12  --  6 11     Hematology Recent Labs  Lab 08/06/20 [phone]  08/06/20 0856 08/08/20 2145  WBC 15.9*  --  12.9*  RBC 4.51  --  5.04  HGB 14.6 15.0 16.2  HCT 44.7 44.0 49.3  MCV 99.1  --  97.8  MCH 32.4  --  32.1  MCHC 32.7  --  32.9  RDW 13.5  --  13.2  PLT 310  --  351    BNP Recent Labs  Lab 08/06/20 0844 08/08/20 2145  BNP 940.8* 961.3*     DDimer No results for input(s): DDIMER in the last 168 hours.    Radiology    DG Chest 2 View  Result Date: 08/08/2020 CLINICAL DATA:  Chest pain, shortness of breath, and lower extremity swelling. EXAM: CHEST - 2 VIEW COMPARISON:  08/06/2020 FINDINGS: The heart size and mediastinal contours are within normal limits. Both lungs are clear. Cervical spine fusion hardware again noted. IMPRESSION: No active cardiopulmonary disease. Electronically Signed   By: Danae OrleansJohn A Stahl M.D.   On: 08/08/2020 21:58   ECHOCARDIOGRAM COMPLETE  Result Date: 08/09/2020    ECHOCARDIOGRAM REPORT   Patient Name:   [name] Date of Exam: 08/09/2020 Medical Rec #:  [id number]   Height:       72.0 in Accession #:    [id number]  Weight:       192.9 lb Date of Birth:  Nov 12, 1960   BSA:          2.098 m Patient Age:     60 years    BP:           136/96 mmHg Patient Gender: M           HR:           90 bpm. Exam Location:  Inpatient Procedure: 2D Echo, Cardiac Doppler and Color Doppler Indications:    CHF  History:        Patient has prior history of Echocardiogram examinations, most                 recent 08/18/2019. CHF, CAD and Previous Myocardial Infarction,                 COPD; Risk Factors:Hypertension.  Sonographer:    Shirlean KellyJohn Mendel Rochefort Referring Phys: [id number] BRADLEY S CHOTINER IMPRESSIONS  1. Left ventricular ejection fraction, by estimation, is 35 to 40%. The left ventricle has moderately decreased function. The left ventricle demonstrates global hypokinesis. There is severe left ventricular hypertrophy. Left ventricular diastolic parameters are consistent with Grade I diastolic dysfunction (impaired relaxation).  2. Right ventricular systolic function is normal. The right ventricular size is normal.  3. The mitral valve is normal in structure. No evidence of mitral valve regurgitation. No evidence of mitral stenosis.  4. The aortic valve is tricuspid. Aortic valve regurgitation is not visualized. Mild to moderate aortic valve sclerosis/calcification is present, without any evidence of aortic stenosis.  5. The inferior vena cava is normal in size with greater than 50% respiratory variability, suggesting right atrial pressure of 3 mmHg. FINDINGS  Left Ventricle: Left ventricular ejection fraction, by estimation, is 35 to 40%. The left ventricle has moderately decreased function. The left ventricle demonstrates global hypokinesis. Definity contrast agent was given IV to delineate the left ventricular endocardial borders. The left ventricular internal cavity size was normal in size. There is severe left ventricular hypertrophy. Left ventricular diastolic parameters are consistent with Grade I diastolic dysfunction (impaired relaxation). Right Ventricle: The right ventricular size is normal.Right ventricular systolic function  is normal. Left Atrium:  Left atrial size was normal in size. Right Atrium: Right atrial size was normal in size. Pericardium: There is no evidence of pericardial effusion. Mitral Valve: The mitral valve is normal in structure. No evidence of mitral valve regurgitation. No evidence of mitral valve stenosis. Tricuspid Valve: The tricuspid valve is normal in structure. Tricuspid valve regurgitation is trivial. No evidence of tricuspid stenosis. Aortic Valve: The aortic valve is tricuspid. Aortic valve regurgitation is not visualized. Mild to moderate aortic valve sclerosis/calcification is present, without any evidence of aortic stenosis. Aortic valve mean gradient measures 4.0 mmHg. Aortic valve peak gradient measures 6.7 mmHg. Aortic valve area, by VTI measures 2.24 cm. Pulmonic Valve: The pulmonic valve was normal in structure. Pulmonic valve regurgitation is trivial. No evidence of pulmonic stenosis. Aorta: The aortic root is normal in size and structure. Venous: The inferior vena cava is normal in size with greater than 50% respiratory variability, suggesting right atrial pressure of 3 mmHg. IAS/Shunts: No atrial level shunt detected by color flow Doppler.  LEFT VENTRICLE PLAX 2D LVIDd:         4.30 cm  Diastology LVIDs:         3.60 cm  LV e' medial:    3.05 cm/s LV PW:         1.60 cm  LV E/e' medial:  21.8 LV IVS:        1.60 cm  LV e' lateral:   7.07 cm/s LVOT diam:     2.00 cm  LV E/e' lateral: 9.4 LV SV:         39 LV SV Index:   19 LVOT Area:     3.14 cm  RIGHT VENTRICLE             IVC RV Basal diam:  4.50 cm     IVC diam: 1.30 cm RV S prime:     11.20 cm/s TAPSE (M-mode): 1.9 cm LEFT ATRIUM             Index       RIGHT ATRIUM           Index LA diam:        2.80 cm 1.33 cm/m  RA Area:     19.80 cm LA Vol (A2C):   34.7 ml 16.54 ml/m RA Volume:   56.70 ml  27.02 ml/m LA Vol (A4C):   34.2 ml 16.30 ml/m LA Biplane Vol: 36.0 ml 17.16 ml/m  AORTIC VALVE AV Area (Vmax):    2.61 cm AV Area (Vmean):    2.52 cm AV Area (VTI):     2.24 cm AV Vmax:           129.00 cm/s AV Vmean:          92.800 cm/s AV VTI:            0.174 m AV Peak Grad:      6.7 mmHg AV Mean Grad:      4.0 mmHg LVOT Vmax:         107.00 cm/s LVOT Vmean:        74.500 cm/s LVOT VTI:          0.124 m LVOT/AV VTI ratio: 0.71  AORTA Ao Root diam: 3.00 cm Ao Asc diam:  2.70 cm MITRAL VALVE MV Area (PHT): 5.84 cm    SHUNTS MV Decel Time: 130 msec    Systemic VTI:  0.12 m MV E velocity: 66.40 cm/s  Systemic Diam: 2.00 cm MV A velocity:  76.30 cm/s MV E/A ratio:  0.87 Olga Millers MD Electronically signed by Olga Millers MD Signature Date/Time: 08/09/2020/2:53:21 PM    Final     Cardiac Studies   Relevant CV Studies: 2D echo 07/2020 IMPRESSIONS  1. Left ventricular ejection fraction, by estimation, is 35 to 40%. The  left ventricle has moderately decreased function. The left ventricle  demonstrates global hypokinesis. There is severe left ventricular  hypertrophy. Left ventricular diastolic  parameters are consistent with Grade I diastolic dysfunction (impaired  relaxation).  2. Right ventricular systolic function is normal. The right ventricular  size is normal.  3. The mitral valve is normal in structure. No evidence of mitral valve  regurgitation. No evidence of mitral stenosis.  4. The aortic valve is tricuspid. Aortic valve regurgitation is not  visualized. Mild to moderate aortic valve sclerosis/calcification is  present, without any evidence of aortic stenosis.  5. The inferior vena cava is normal in size with greater than 50%  respiratory variability, suggesting right atrial pressure of 3 mmHg.   ECHO: 08/18/2019 IMPRESSIONS  1. Low normal LV systolic function; mild LVH; grade 2 diastolic  dysfunction.  2. Left ventricular ejection fraction, by estimation, is 50 to 55%. The  left ventricle has low normal function. The left ventricle has no regional  wall motion abnormalities. There is mild left ventricular  hypertrophy.  Left ventricular diastolic  parameters are consistent with Grade II diastolic dysfunction  (pseudonormalization).  3. Right ventricular systolic function is normal. The right ventricular  size is normal.  4. The mitral valve is normal in structure. Trivial mitral valve  regurgitation. No evidence of mitral stenosis.  5. The aortic valve is tricuspid. Aortic valve regurgitation is not  visualized. Mild aortic valve sclerosis is present, with no evidence of  aortic valve stenosis.  6. The inferior vena cava is normal in size with greater than 50%  respiratory variability, suggesting right atrial pressure of 3 mmHg.    Right and left heart cath 02/28/20:  Prox RCA to Mid RCA lesion is 50% stenosed - In-stent re-stenosis of distal 1/2 of stented segment (by report 3 overlapping stents). The remainder of the stent segment is widely patent.  Prox LAD lesion is 40% stenosed with 50% stenosed side branch in 1st Diag.  Mid Cx lesion is 25% stenosed.  Hemodynamic findings consistent with severe pulmonary hypertension.  LV end diastolic pressure is severely elevated.  SUMMARY  Moderate in-stent restenosis of the RCA with moderate bifurcation LAD-diagonal disease but no obstructive disease.  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  Have written for 80 mg of Lasix x1 today and then 40 mg IV twice daily starting tomorrow.  If urine output does not pick up, may want to consider milrinone.   Patient Profile     60 y.o. male with a hx of COPD, CAD status post PCI 5 years ago in [place], medication noncompliance, hep C not treated per  notes, IVDU previously heroin now meth, tobacco use  who is being seen today for the evaluation of heart failure at the request of Dr Almeta Monas.   Assessment & Plan    1.  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 -EF 50-55% on echo 08/2019 but now  down to 35-40% on echo this admi -admitted with acute CHF exacerbation with elevated BNP in setting of hypertensive urgency (BP 207/138mmHg) -recent ER visit with SOB and chest tightness, elevated BNP and + amphetamines on drug screen but refused admission -now back with acute CHF and NYHA class III sx (BNP 961) -medical noncompliance with heart meds but recently restarted Lasix -Lasix increased to 80mg  BID IV yesterday due to low UOP -he put out 2.3L yesterday and is net neg 1.57L since admit -SCr stable at 1.11 and K+ 3.6 -would prefer to keep on IV Lasix one more day but he says that he has to go to work tomorrow and needs to leave>>if he is discharged then would put him on Lasix 40mg  BID PO until seen back in the clinic with Dr. -repeat echo showed decline in EF with global HK ? Related to hypertensive CM (he has known CAD but cath 04/2019 with moderate in stent restenosis of RCA and moderate LAD/Diag dz but not obstructive -restarted on ACE I and dose increased to Lisinopril 40mg  daily -given financial restraints he is likely not a candidate for Entresto -restarted on BB with Toprol XL 25mg  daily -Case manager and SW have been consulted to address social needs  2.  ASCAD/elevated Trop -s/p PCI 5 years ago in [place] -cath 04/2019 showed moderate in-stent restenosis of the RCA with moderate bifurcation LAD-diagonal disease but no obstructive disease. -mild CP and SOB on admit but likely related to acute CHF and hypertensive urgency -hsTrop mildly elevated at 124>99>81 -2D echo with a decline in LVF ? Hypertensive vs ischemic with progression of CAD -Trop elevation c/w demand ischemia -we discussed further workup with  or repeat cath and he adamantly refuses either study and says he will never have either study done again -continue ASA, statin and BB  3.  HLD -noncompliant with meds -LDL 98 on admit -restarted on statin  4.  Hypertensive urgency -BP mildly elevated this am but controlled yesterday throughout the day>> improved after restarting BB and ACE I -he is noncompliant with meds  I have spent a total of 35 minutes with patient reviewing 2D echo and cardiac cath , telemetry, EKGs, labs and examining patient as well as establishing an assessment and plan that was discussed with the patient.  > 50% of time was spent in direct patient care.       For questions or updates, please contact [facility] Please consult [email or web address] for contact info under        Signed, , MD  08/10/2020, 10:52 AM

## 2020-08-20 ENCOUNTER — Inpatient Hospital Stay: Payer: Self-pay | Admitting: Family Medicine

## 2020-08-24 IMAGING — CR DG CHEST 2V
2 series · 2 of 2 positions shown · non-contrast
Comparison: 04/28/2019

CLINICAL DATA: Shortness of breath

EXAM:
CHEST - 2 VIEW

[chest pa]
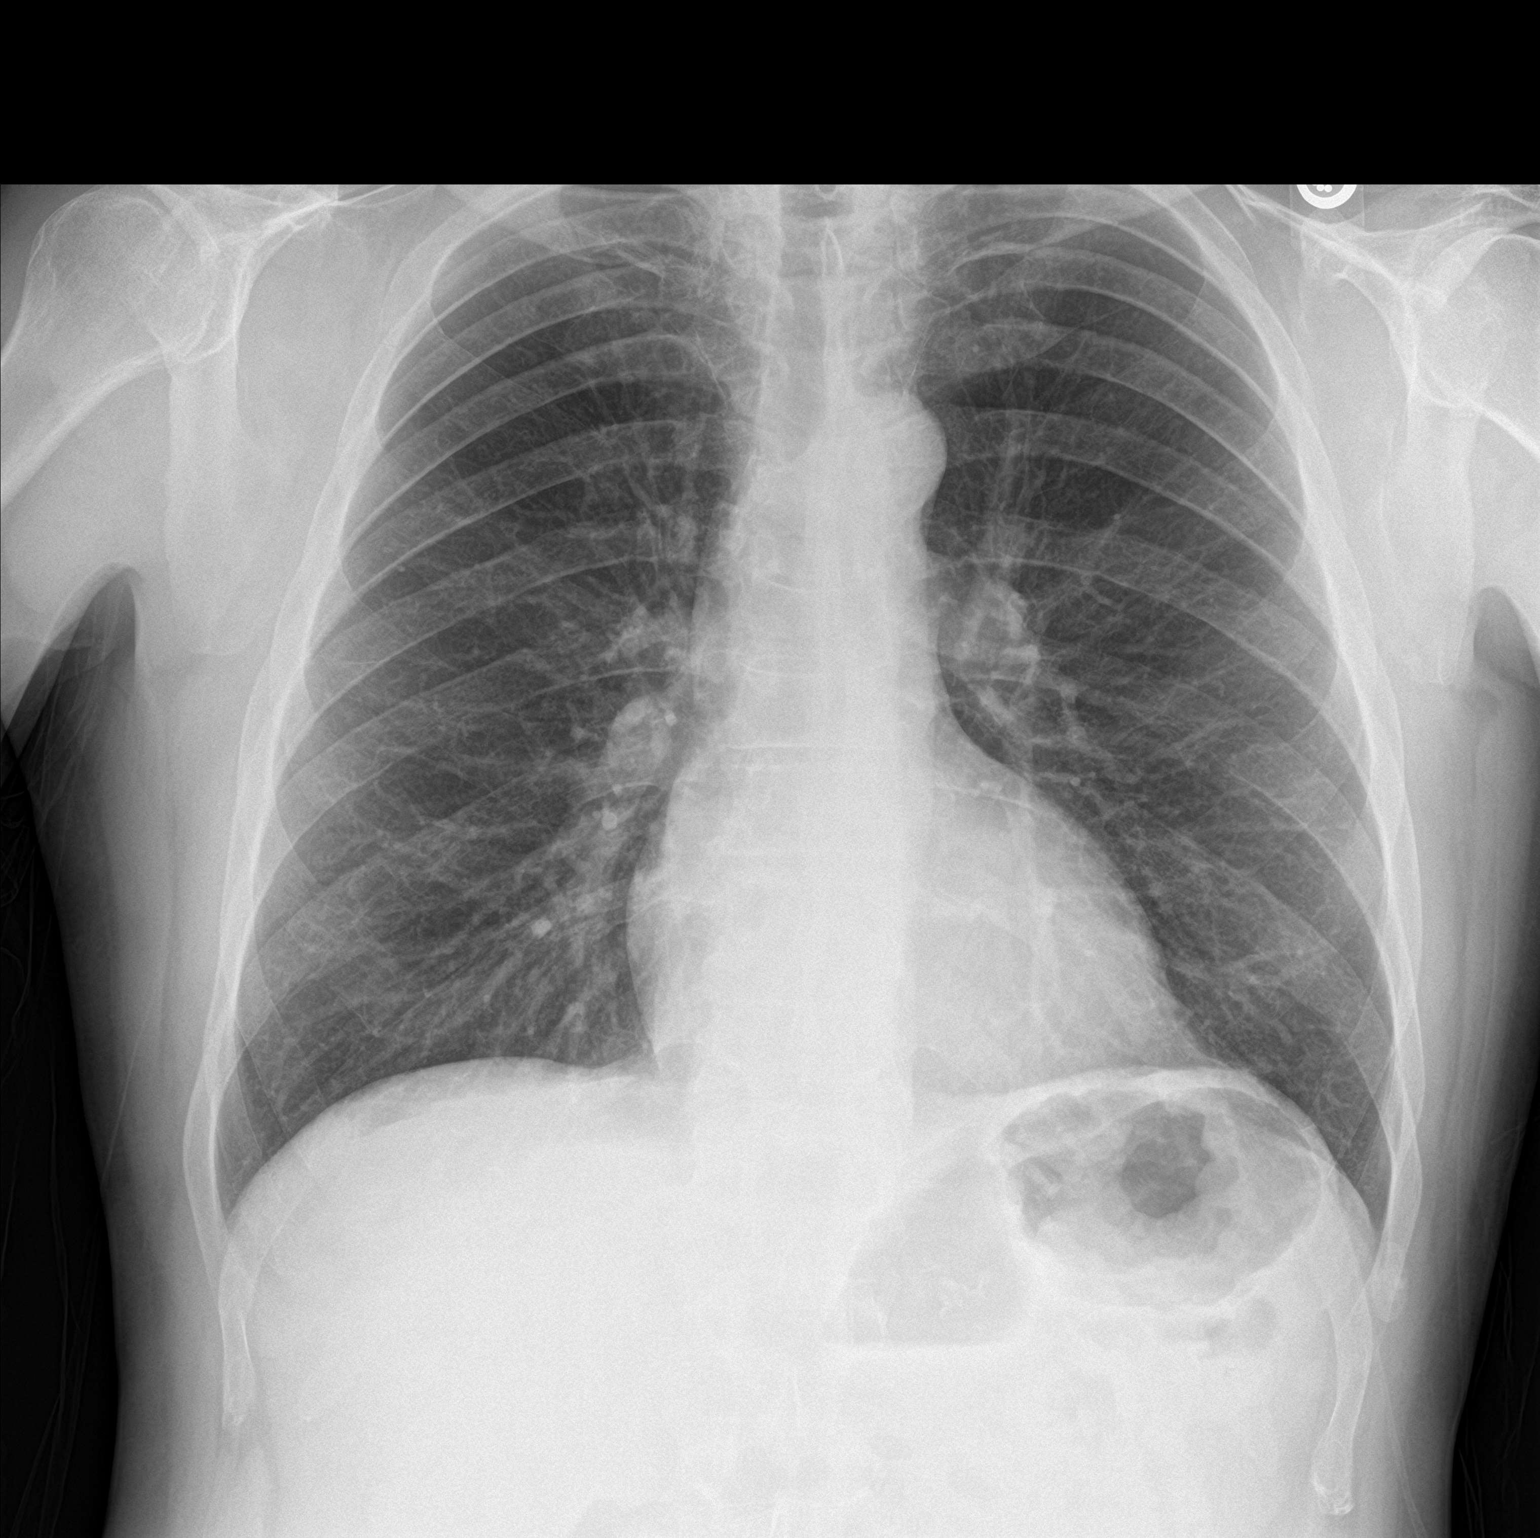

[chest lat]
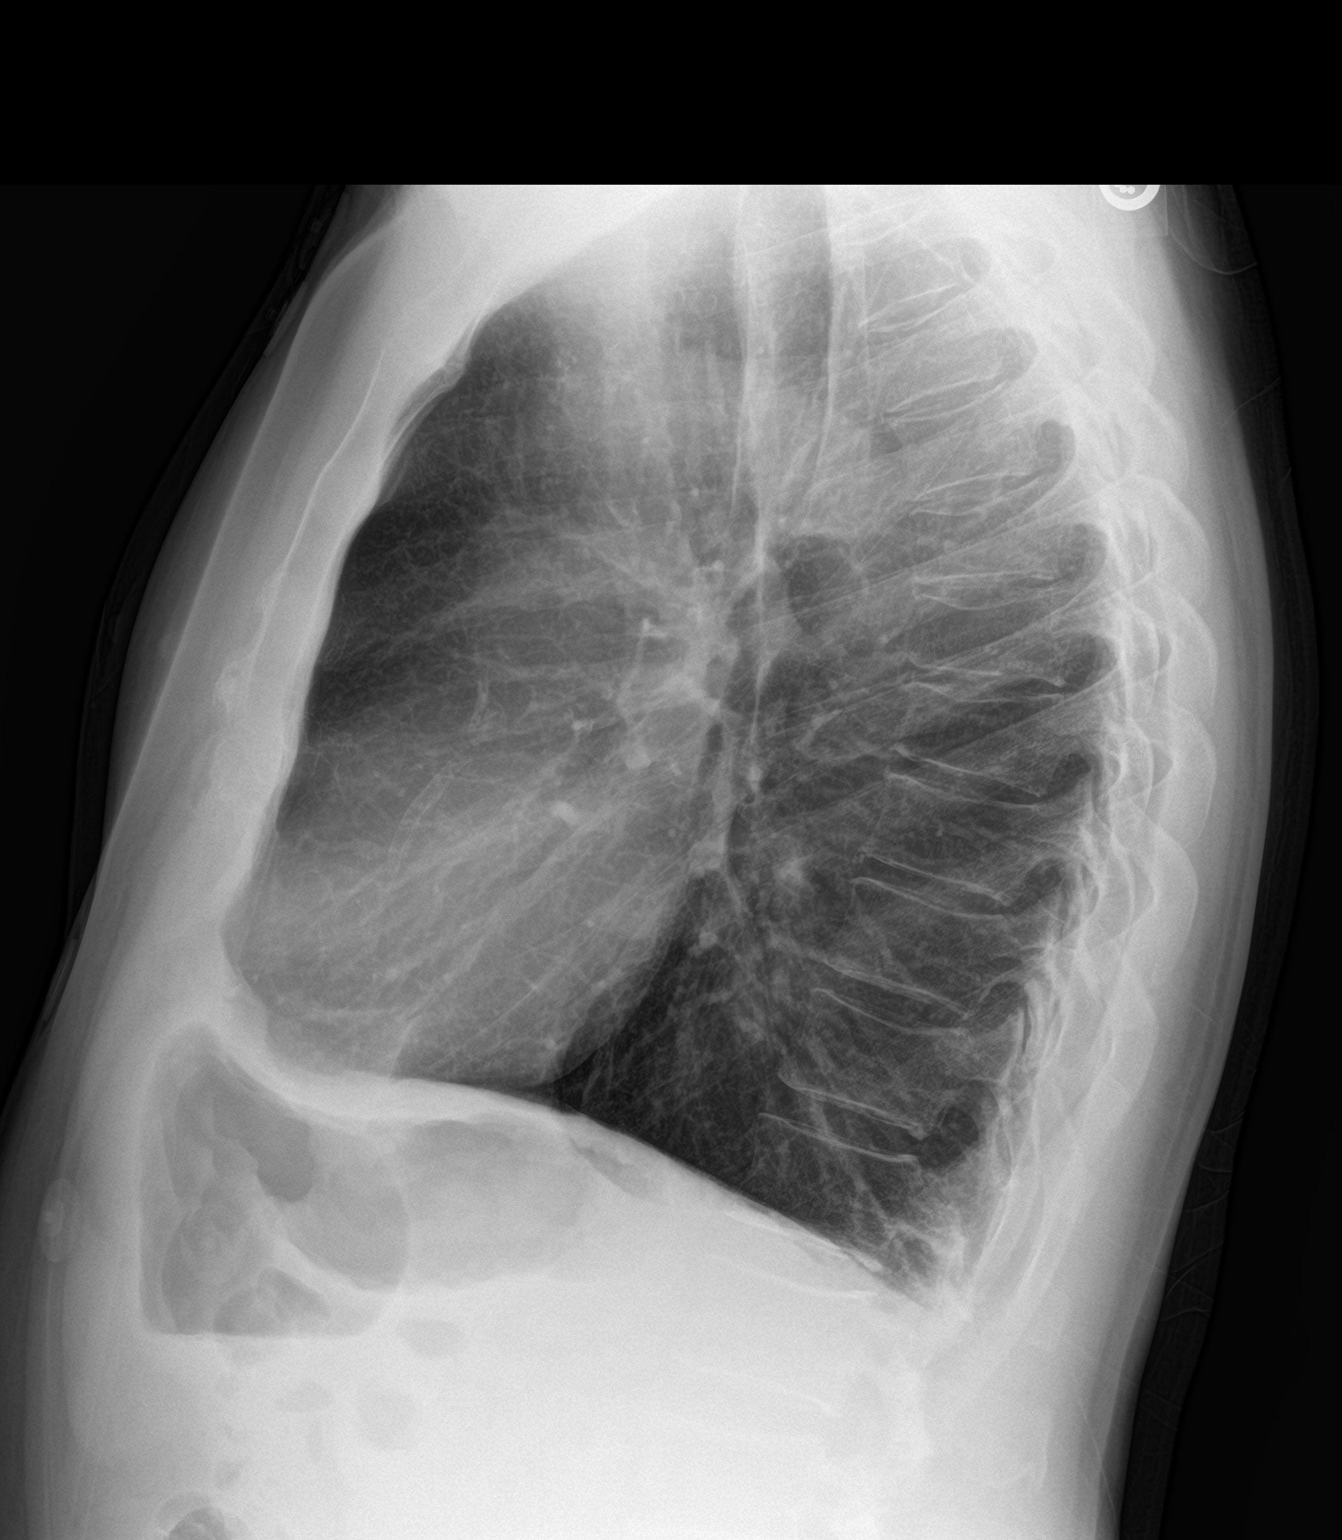

[2 of 2 positions shown; findings below may reference images not displayed]

FINDINGS: The heart size and mediastinal contours are within normal limits.
Coronary stenting is noted. Both lungs are clear. No pleural
effusion or pneumothorax. Chronic mid thoracic compression
deformities.
IMPRESSION: No acute process in the chest.

## 2020-09-05 NOTE — Progress Notes (Deleted)
Cardiology Office Note:    Date:  09/05/2020   ID:  Carston Behrns, DOB 11/13/1960, MRN [id number]  PCP:  Patient, No Pcp Per (Inactive)  Cardiologist:  Caraline Deutschman [place], MD  Electrophysiologist: Lewayne Bunting, MD  Referring MD: No ref. provider found   No chief complaint on file.   History of Present Illness:    Abdulqadir Demmon is a 60 y.o. male seen for follow up CHF. Last seen in our office Jan 2021. He has  a hx of COPD, CAD status post PCI > 5 years ago in [place], medication noncompliance, hep C not treated per notes, IVDU previously heroin now meth, tobacco use. He was seen in the ER on 04/08/2019 with increasing shortness of breath for 3 days and a nonproductive cough.  He was treated for COPD exacerbation and discharged on prednisone.  He came to the ER on 04/25/2019 with a left without being seen.  He was admitted 04/28/2019 with chest pain and shortness of breath.  Echocardiogram revealed new EF of 25 to 30% and grade 3 diastolic dysfunction. He underwent right and left heart cath which revealed in-stent restenosis of the distal 1/2 of stented segment (by report 3 overlapping stents). No intervention.  He was started on beta-blocker, spironolactone, and lisinopril.  He was not started on Entresto due to cost and medication adherence.  He underwent right and left heart cath elevated cardiac enzymes.  He had moderate but nonobstructive disease in multiple vessels.  Medical therapy was recommended.  He was discharged on 05/02/19.  Unfortunately he presented back to the ER on 05/05/2019 planes of dyspnea, cold hands and feet, and feeling unwell.  Cardiology was consulted.  He reported medication compliance.  Work-up was unremarkable chest x-ray without edema, BNP in the 200s, which was lower than discharge.  BP was poorly controlled with systolic in the 180s and diastolic in the 120s.  He was euvolemic on exam.  Carvedilol was increased to 37.5 mg twice daily and he was discharged from the ER.  Since last  seen in office in Jan 2021 he has multiple ED and hospital visits for chest pain, uncontrolled HTN, CHF in the setting of methamphetamine use, IV heroin and medical noncompliance. Last admitted 4/22-4/24 with similar issues. Echo showed EF 35-40%. Medications adjusted and DC home.   Past Medical History:  Diagnosis Date  . CHF (congestive heart failure) (HCC)   . COPD (chronic obstructive pulmonary disease) (HCC)   . Coronary artery disease    a. s/p prior PCI.  Marland Kitchen Hepatitis-C   . Hypertension   . IV drug abuse (HCC)    a. 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  . Medically noncompliant   . MI (myocardial infarction) (HCC)   . Tobacco abuse     Past Surgical History:  Procedure Laterality Date  . BACK SURGERY    . NECK SURGERY    . RIGHT/LEFT HEART CATH AND CORONARY ANGIOGRAPHY N/A 04/30/2019   Procedure: RIGHT/LEFT HEART CATH AND CORONARY ANGIOGRAPHY;  Surgeon: Marykay Lex, MD;  Location: [facility] INVASIVE CV LAB;  Service: Cardiovascular;  Laterality: N/A;  . stints      Current Medications: No outpatient medications have been marked as taking for the 09/08/20 encounter (Appointment) with [place], Verania Salberg M, MD.     Allergies:   Ivp dye [iodinated diagnostic agents] and Tramadol   Social History   Socioeconomic History  . Marital status: Divorced    Spouse name: Not on file  . Number of children: Not on  file  . Years of education: Not on file  . Highest education level: Not on file  Occupational History  . Not on file  Tobacco Use  . Smoking status: Current Every Day Smoker    Packs/day: 0.75    Types: Cigarettes  . Smokeless tobacco: Never Used  Vaping Use  . Vaping Use: Never used  Substance and Sexual Activity  . Alcohol use: Never  . Drug use: Yes    Types: Methamphetamines    Comment: heroin  . Sexual activity: Not on file  Other Topics Concern  . Not on file  Social History Narrative  . Not on file   Social Determinants of Health   Financial  Resource Strain: Not on file  Food Insecurity: Not on file  Transportation Needs: Not on file  Physical Activity: Not on file  Stress: Not on file  Social Connections: Not on file     Family History: The patient's family history includes Rheum arthritis in his mother.  ROS:   Please see the history of present illness.     All other systems reviewed and are negative.  EKGs/Labs/Other Studies Reviewed:    The following studies were reviewed today:  Right and left heart cath 02/28/20:  Prox RCA to Mid RCA lesion is 50% stenosed - In-stent re-stenosis of distal 1/2 of stented segment (by report 3 overlapping stents). The remainder of the stent segment is widely patent.  Prox LAD lesion is 40% stenosed with 50% stenosed side branch in 1st Diag.  Mid Cx lesion is 25% stenosed.  Hemodynamic findings consistent with severe pulmonary hypertension.  LV end diastolic pressure is severely elevated.   SUMMARY  Moderate in-stent restenosis of the RCA with moderate bifurcation LAD-diagonal disease but no obstructive disease.  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  Have written for 80 mg of Lasix x1 today and then 40 mg IV twice daily starting tomorrow.  If urine output does not pick up, may want to consider milrinone.   Echo 04/29/19:  1. Left ventricular ejection fraction, by visual estimation, is 25 to 30%. The left ventricle has severely decreased function. There is mildly increased left ventricular hypertrophy.  2. Left ventricular diastolic parameters are consistent with Grade III diastolic dysfunction (restrictive).  3. The left ventricle  demonstrates global hypokinesis.  4. Global right ventricle has normal systolic function.The right ventricular size is normal.  5. Left atrial size was mildly dilated.  6. Right atrial size was normal.  7. The mitral valve is normal in structure. Trivial mitral valve regurgitation. No evidence of mitral stenosis.  8. The tricuspid valve is normal in structure.  9. The aortic valve is tricuspid. Aortic valve regurgitation is not visualized. Mild aortic valve sclerosis without stenosis. 10. The pulmonic valve was normal in structure. Pulmonic valve regurgitation is not visualized. 11. The inferior vena cava is normal in size with greater than 50% respiratory variability, suggesting right atrial pressure of 3 mmHg. 12. Severe global reduction in LV systolic function; restrictive filling; mild LVH; mild LAE.  Echo 08/09/20: IMPRESSIONS    1. Left ventricular ejection fraction, by estimation, is 35 to 40%. The  left ventricle has moderately decreased function. The left ventricle  demonstrates global  hypokinesis. There is severe left ventricular  hypertrophy. Left ventricular diastolic  parameters are consistent with Grade I diastolic dysfunction (impaired  relaxation).  2. Right ventricular systolic function is normal. The right ventricular  size is normal.  3. The mitral valve is normal in structure. No evidence of mitral valve  regurgitation. No evidence of mitral stenosis.  4. The aortic valve is tricuspid. Aortic valve regurgitation is not  visualized. Mild to moderate aortic valve sclerosis/calcification is  present, without any evidence of aortic stenosis.  5. The inferior vena cava is normal in size with greater than 50%  respiratory variability, suggesting right atrial pressure of 3 mmHg.  EKG:  EKG is ordered today.  The ekg ordered today demonstrates sinus rhythm with HR 84, TWI V4-6 and inferior leads that is stable from prior tracing  Recent Labs: 08/08/2020: ALT 73; B  Natriuretic Peptide 961.3; Hemoglobin 16.2; Platelets 351 08/10/2020: BUN 19; Creatinine, Ser 1.11; Potassium 3.6; Sodium 137  Recent Lipid Panel    Component Value Date/Time   CHOL 188 08/09/2020 0552   TRIG 95 08/09/2020 0552   HDL 71 08/09/2020 0552   CHOLHDL 2.6 08/09/2020 0552   VLDL 19 08/09/2020 0552   LDLCALC 98 08/09/2020 0552    Physical Exam:    VS:  There were no vitals taken for this visit.    Wt Readings from Last 3 Encounters:  08/10/20 189 lb (85.7 kg)  08/06/20 180 lb (81.6 kg)  08/02/20 198 lb 6.6 oz (90 kg)     GEN:  Well nourished, well developed in no acute distress HEENT: Normal NECK: No JVD; No carotid bruits LYMPHATICS: No lymphadenopathy CARDIAC: RRR, no murmurs, rubs, gallops RESPIRATORY:  Clear to auscultation without rales, wheezing or rhonchi  ABDOMEN: Soft, non-tender, non-distended MUSCULOSKELETAL:  No edema; No deformity  SKIN: Warm and dry NEUROLOGIC:  Alert and oriented x 3 PSYCHIATRIC:  Normal affect   ASSESSMENT:    No diagnosis found. PLAN:    In order of problems listed above:  1. Chronic systolic and diastolic heart failure EF 35-40% by Echo in April. Improved from 25-30% - Continue Coreg 37.5 mg twice daily, spironolactone 12.5 mg, and lisinopril 20 mg - Continue 40 mg of Lasix daily - he appears euvolemic today - I will add imdur 15 mg --> increase to 30 mg  - will likely need to titrate up imdur - close follow up - he would be a great candidate for entresto if he can get patient assistance - if he continues to be dyspneic, may need to increase lasix vs starting entresto; but I suspect his dyspnea is related to his COPD - he has been out of his inhalers (I refilled duoneb today, he can't afford dulera) - he needs a PCP - have given him information for health and wellness clinic so they can help with medication assitance   2. Hypertension  3. COPD   4. Nonobstructive CAD  5. Hyperlipidemia with LDL goal < 70 -  Continue aspirin and 80 mg Lipitor - 04/29/2019: Cholesterol 215; HDL 93; LDL Cholesterol 111; Triglycerides 57; VLDL 11 - recheck labs in 6 weeks - added imdur as above      Medication Adjustments/Labs and Tests Ordered: Current medicines are reviewed at length with the patient today.  Concerns regarding medicines are outlined above.  No orders of the defined types were placed in this encounter.  No orders of the defined types were placed in this encounter.   Signed, Mcclellan Demarais [place],  MD  09/05/2020 2:42 PM    [facility]

## 2020-09-08 ENCOUNTER — Ambulatory Visit: Payer: Self-pay | Admitting: Cardiology

## 2020-10-14 ENCOUNTER — Other Ambulatory Visit: Payer: Self-pay

## 2020-10-14 ENCOUNTER — Emergency Department (HOSPITAL_COMMUNITY)
Admission: EM | Admit: 2020-10-14 | Discharge: 2020-10-14 | Disposition: A | Discharge status: Home / Self Care | Payer: Self-pay | Attending: Emergency Medicine | Admitting: Emergency Medicine

## 2020-10-14 ENCOUNTER — Emergency Department (HOSPITAL_COMMUNITY): Payer: Self-pay

## 2020-10-14 ENCOUNTER — Encounter (HOSPITAL_COMMUNITY): Payer: Self-pay | Admitting: Emergency Medicine

## 2020-10-14 DIAGNOSIS — R0602 Shortness of breath: Secondary | ICD-10-CM | POA: Insufficient documentation

## 2020-10-14 DIAGNOSIS — I5043 Acute on chronic combined systolic (congestive) and diastolic (congestive) heart failure: Secondary | ICD-10-CM | POA: Insufficient documentation

## 2020-10-14 DIAGNOSIS — Z79899 Other long term (current) drug therapy: Secondary | ICD-10-CM | POA: Insufficient documentation

## 2020-10-14 DIAGNOSIS — I2510 Atherosclerotic heart disease of native coronary artery without angina pectoris: Secondary | ICD-10-CM | POA: Insufficient documentation

## 2020-10-14 DIAGNOSIS — I110 Hypertensive heart disease with heart failure: Secondary | ICD-10-CM | POA: Insufficient documentation

## 2020-10-14 DIAGNOSIS — Z7982 Long term (current) use of aspirin: Secondary | ICD-10-CM | POA: Insufficient documentation

## 2020-10-14 DIAGNOSIS — R0789 Other chest pain: Secondary | ICD-10-CM

## 2020-10-14 DIAGNOSIS — I160 Hypertensive urgency: Secondary | ICD-10-CM

## 2020-10-14 DIAGNOSIS — J441 Chronic obstructive pulmonary disease with (acute) exacerbation: Secondary | ICD-10-CM | POA: Insufficient documentation

## 2020-10-14 DIAGNOSIS — R072 Precordial pain: Secondary | ICD-10-CM | POA: Insufficient documentation

## 2020-10-14 DIAGNOSIS — F17210 Nicotine dependence, cigarettes, uncomplicated: Secondary | ICD-10-CM | POA: Insufficient documentation

## 2020-10-14 LAB — CBC WITH DIFFERENTIAL/PLATELET
Abs Immature Granulocytes: 0.03 10*3/uL (ref 0.00–0.07)
Basophils Absolute: 0.1 10*3/uL (ref 0.0–0.1)
Basophils Relative: 1 %
Eosinophils Absolute: 0.1 10*3/uL (ref 0.0–0.5)
Eosinophils Relative: 1 %
HCT: 46 % (ref 39.0–52.0)
Hemoglobin: 15.6 g/dL (ref 13.0–17.0)
Immature Granulocytes: 0 %
Lymphocytes Relative: 25 %
Lymphs Abs: 2.3 10*3/uL (ref 0.7–4.0)
MCH: 32.1 pg (ref 26.0–34.0)
MCHC: 33.9 g/dL (ref 30.0–36.0)
MCV: 94.7 fL (ref 80.0–100.0)
Monocytes Absolute: 1.1 10*3/uL — ABNORMAL HIGH (ref 0.1–1.0)
Monocytes Relative: 11 %
Neutro Abs: 5.8 10*3/uL (ref 1.7–7.7)
Neutrophils Relative %: 62 %
Platelets: 259 10*3/uL (ref 150–400)
RBC: 4.86 MIL/uL (ref 4.22–5.81)
RDW: 13 % (ref 11.5–15.5)
WBC: 9.4 10*3/uL (ref 4.0–10.5)
nRBC: 0 % (ref 0.0–0.2)

## 2020-10-14 LAB — TROPONIN I (HIGH SENSITIVITY)
Troponin I (High Sensitivity): 33 ng/L — ABNORMAL HIGH (ref <18)
Troponin I (High Sensitivity): 27 ng/L — ABNORMAL HIGH (ref <18)

## 2020-10-14 LAB — COMPREHENSIVE METABOLIC PANEL
ALT: 130 U/L — ABNORMAL HIGH (ref 0–44)
AST: 63 U/L — ABNORMAL HIGH (ref 15–41)
Albumin: 3.6 g/dL (ref 3.5–5.0)
Alkaline Phosphatase: 60 U/L (ref 38–126)
Anion gap: 10 (ref 5–15)
BUN: 13 mg/dL (ref 6–20)
CO2: 27 mmol/L (ref 22–32)
Calcium: 9.4 mg/dL (ref 8.9–10.3)
Chloride: 100 mmol/L (ref 98–111)
Creatinine, Ser: 1.33 mg/dL — ABNORMAL HIGH (ref 0.61–1.24)
GFR, Estimated: >60 mL/min (ref >60)
Glucose, Bld: 142 mg/dL — ABNORMAL HIGH (ref 70–99)
Potassium: 3.3 mmol/L — ABNORMAL LOW (ref 3.5–5.1)
Sodium: 137 mmol/L (ref 135–145)
Total Bilirubin: 0.5 mg/dL (ref 0.3–1.2)
Total Protein: 7 g/dL (ref 6.5–8.1)

## 2020-10-14 LAB — BRAIN NATRIURETIC PEPTIDE: B Natriuretic Peptide: 536 pg/mL — ABNORMAL HIGH (ref 0.0–100.0)

## 2020-10-14 MED ORDER — CARVEDILOL 12.5 MG PO TABS
25.0000 mg | ORAL_TABLET | Freq: Two times a day (BID) | ORAL | Status: DC
  Administered 2020-10-14: 25 mg via ORAL
  Filled 2020-10-14: qty 2

## 2020-10-14 MED ORDER — LISINOPRIL 40 MG PO TABS: ORAL_TABLET | Freq: Every day | ORAL | 0 refills | Status: DC

## 2020-10-14 MED ORDER — CARVEDILOL 25 MG PO TABS: ORAL_TABLET | Freq: Two times a day (BID) | ORAL | 0 refills | Status: DC

## 2020-10-14 MED ORDER — ALBUTEROL SULFATE HFA 108 (90 BASE) MCG/ACT IN AERS: 2.0000 | INHALATION_SPRAY | RESPIRATORY_TRACT | 0 refills | Status: DC | PRN

## 2020-10-14 MED ORDER — OXYCODONE-ACETAMINOPHEN 5-325 MG PO TABS
2.0000 | ORAL_TABLET | Freq: Once | ORAL | Status: AC
  Administered 2020-10-14: 2 via ORAL
  Filled 2020-10-14: qty 2

## 2020-10-14 MED ORDER — LISINOPRIL 20 MG PO TABS
40.0000 mg | ORAL_TABLET | Freq: Once | ORAL | Status: AC
  Administered 2020-10-14: 40 mg via ORAL
  Filled 2020-10-14: qty 2

## 2020-10-14 MED ORDER — CARVEDILOL 12.5 MG PO TABS: 25.0000 mg | ORAL_TABLET | Freq: Two times a day (BID) | ORAL | Status: DC

## 2020-10-14 MED ORDER — METOPROLOL TARTRATE 25 MG PO TABS
25.0000 mg | ORAL_TABLET | Freq: Once | ORAL | Status: AC
  Administered 2020-10-14: 25 mg via ORAL
  Filled 2020-10-14: qty 1

## 2020-10-14 MED ORDER — METOPROLOL SUCCINATE ER 25 MG PO TB24: 25.0000 mg | ORAL_TABLET | Freq: Every day | ORAL | 0 refills | Status: DC

## 2020-10-14 NOTE — Discharge Instructions (Addendum)
Resume taking your blood pressure medications as previously prescribed.  Use your albuterol inhaler, 2 puffs every 4 hours as needed for wheezing.  Return to the ER if you develop worsening pain, worsening breathing, or other new and concerning symptoms.

## 2020-10-14 NOTE — ED Provider Notes (Signed)
[facility] EMERGENCY DEPARTMENT Provider Note   CSN: [id number] Arrival date & time: 10/14/20  0105     History Chief Complaint  Patient presents with   Chest Pain   Shortness of Breath    COPD/CHF    Francisco [name] is a 60 y.o. male.  Patient is a 60 year old male with past medical history of coronary artery disease with prior stents, CHF, COPD, hypertension, IV drug abuse.  Patient presenting today for evaluation of chest discomfort.  He describes a 3-day history of sharp pain to the center of his chest with associated shortness of breath.  He denies any nausea, diaphoresis, or radiation to the arm or jaw.  He denies any productive cough or fever.  He denies any recent exertional symptoms.  Patient does tell me he ran out of his blood pressure medications 1 week ago.  The history is provided by the patient.  Chest Pain Pain location:  Substernal area Pain quality: tightness   Pain radiates to:  Does not radiate Pain severity:  Moderate Onset quality:  Sudden Duration:  3 days Timing:  Constant Progression:  Unchanged Chronicity:  New Relieved by:  Nothing Associated symptoms: shortness of breath   Shortness of Breath Associated symptoms: chest pain       Past Medical History:  Diagnosis Date   CHF (congestive heart failure) (HCC)    COPD (chronic obstructive pulmonary disease) (HCC)    Coronary artery disease    a. s/p prior PCI.   Hepatitis-C    Hypertension    IV drug abuse (HCC)    a. 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   Medically noncompliant    MI (myocardial infarction) (HCC)    Tobacco abuse     Patient Active Problem List   Diagnosis Date Noted   Polysubstance abuse (HCC) 08/10/2020   Acute on chronic heart failure with preserved ejection fraction (HFpEF) (HCC) 08/09/2020   NSTEMI (non-ST elevated myocardial infarction) (HCC) 08/09/2020   Prolonged QT interval 08/09/2020   CHF, acute on chronic (HCC) 08/08/2020    SOB (shortness of breath)    Hypertensive urgency    Acute on chronic respiratory failure with hypoxia and hypercapnia (HCC) 01/10/2020   COPD exacerbation (HCC) 01/09/2020   Hypotension 08/18/2019   Acute on chronic combined systolic and diastolic CHF (congestive heart failure) (HCC) 08/17/2019   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   Chronic obstructive pulmonary disease (HCC)    Troponin I above reference range    Unstable angina (HCC)    Dilated cardiomyopathy (HCC)    CAD S/P percutaneous coronary angioplasty    ACS (acute coronary syndrome) (HCC) 04/29/2019   Acute respiratory failure with hypoxia (HCC) 06/17/2018    Past Surgical History:  Procedure Laterality Date   BACK SURGERY     NECK SURGERY     RIGHT/LEFT HEART CATH AND CORONARY ANGIOGRAPHY N/A 04/30/2019   Procedure: RIGHT/LEFT HEART CATH AND CORONARY ANGIOGRAPHY;  Surgeon: Marykay Lex, MD;  Location: [facility] INVASIVE CV LAB;  Service: Cardiovascular;  Laterality: N/A;   stints         Family History  Problem Relation Age of Onset   Rheum arthritis Mother     Social History   Tobacco Use   Smoking status: Every Day    Packs/day: 0.75    Pack years: 0.00    Types: Cigarettes   Smokeless tobacco: Never  Vaping Use   Vaping Use: Never used  Substance Use Topics   Alcohol use: Never  Drug use: Yes    Types: Methamphetamines    Comment: heroin    Home Medications Prior to Admission medications   Medication Sig Start Date End Date Taking? Authorizing Provider  albuterol (PROVENTIL) (2.5 MG/3ML) 0.083% nebulizer solution Take 3 mLs (2.5 mg total) by nebulization every 6 (six) hours as needed for wheezing or shortness of breath. 05/10/20  Yes Pollina, Canary Brim, MD  albuterol (VENTOLIN HFA) 108 (90 Base) MCG/ACT inhaler INHALE 1-2 PUFFS INTO THE LUNGS EVERY 6 (SIX) HOURS AS NEEDED FOR WHEEZING OR SHORTNESS OF BREATH. 05/06/20 05/06/21 Yes Lorelee New, PA-C  aspirin EC 81 MG tablet Take 1 tablet (81  mg total) by mouth daily. 01/04/20  Yes Tilden Fossa, MD  atorvastatin (LIPITOR) 40 MG tablet Take 1 tablet (40 mg total) by mouth daily. 08/11/20  Yes Alessandra Bevels, MD  carvedilol (COREG) 25 MG tablet TAKE 1 TABLET (25 MG TOTAL) BY MOUTH 2 (TWO) TIMES DAILY WITH A MEAL. Patient taking differently: Take 25 mg by mouth 2 (two) times daily with a meal. 01/17/20 01/16/21 Yes McClung, Marzella Schlein, PA-C  furosemide (LASIX) 40 MG tablet Take 1 tablet (40 mg total) by mouth 2 (two) times daily. 08/10/20  Yes Alessandra Bevels, MD  lisinopril (ZESTRIL) 40 MG tablet TAKE 1 TABLET (40 MG TOTAL) BY MOUTH DAILY. 01/17/20 01/16/21 Yes McClung, Marzella Schlein, PA-C  metoprolol succinate (TOPROL-XL) 25 MG 24 hr tablet Take 1 tablet (25 mg total) by mouth daily. 08/11/20  Yes Alessandra Bevels, MD  nitroGLYCERIN (NITROSTAT) 0.4 MG SL tablet Place 1 tablet (0.4 mg total) under the tongue every 5 (five) minutes x 3 doses as needed for chest pain. 08/10/20  Yes Alessandra Bevels, MD  potassium chloride (KLOR-CON) 10 MEQ tablet Take 1 tablet (10 mEq total) by mouth 2 (two) times daily. 08/10/20  Yes Alessandra Bevels, MD  albuterol (VENTOLIN HFA) 108 (90 Base) MCG/ACT inhaler Inhale 1-2 puffs into the lungs every 6 (six) hours as needed for wheezing or shortness of breath. 05/06/20   Lorelee New, PA-C  albuterol (VENTOLIN HFA) 108 (90 Base) MCG/ACT inhaler INHALE 1-2 PUFFS INTO THE LUNGS EVERY 6 (SIX) HOURS AS NEEDED FOR WHEEZING OR SHORTNESS OF BREATH. 01/17/20 01/16/21  Anders Simmonds, PA-C  atorvastatin (LIPITOR) 80 MG tablet TAKE 1 TABLET (80 MG TOTAL) BY MOUTH DAILY AT 6 PM. Patient not taking: Reported on 10/14/2020 05/06/20 05/06/21  Lorelee New, PA-C  atorvastatin (LIPITOR) 80 MG tablet TAKE 1 TABLET (80 MG TOTAL) BY MOUTH DAILY AT 6 PM. Patient not taking: Reported on 10/14/2020 01/17/20 01/16/21  Anders Simmonds, PA-C  azithromycin (ZITHROMAX) 250 MG tablet TAKE 2 TABLETS BY MOUTH ON DAY, TAKE 1 TABLET ON DAYS 2  THROUGH 5. Patient not taking: Reported on 10/14/2020 05/06/20 05/06/21  Lorelee New, PA-C  furosemide (LASIX) 40 MG tablet TAKE 1 TABLET (40 MG TOTAL) BY MOUTH DAILY. Patient not taking: No sig reported 01/17/20 01/16/21  Anders Simmonds, PA-C  lisinopril (ZESTRIL) 20 MG tablet Take 2 tablets (40 mg total) by mouth daily. Patient not taking: Reported on 10/14/2020 08/10/20   Alessandra Bevels, MD  predniSONE (DELTASONE) 10 MG tablet TAKE 5 TABLETS (50 MG TOTAL) BY MOUTH DAILY WITH BREAKFAST. Patient not taking: No sig reported 05/06/20 05/06/21  Lorelee New, PA-C    Allergies    Ivp dye [iodinated diagnostic agents] and Tramadol  Review of Systems   Review of Systems  Respiratory:  Positive for shortness of breath.  Cardiovascular:  Positive for chest pain.   Physical Exam Updated Vital Signs BP (!) 202/127   Pulse (!) 110   Temp 98.1 F (36.7 C) (Oral)   Resp (!) 22   Ht 6' (1.829 m)   Wt 95 kg   SpO2 95%   BMI 28.40 kg/m   Physical Exam Vitals and nursing note reviewed.  Constitutional:      General: He is not in acute distress.    Appearance: He is well-developed. He is not diaphoretic.  HENT:     Head: Normocephalic and atraumatic.  Cardiovascular:     Rate and Rhythm: Normal rate and regular rhythm.     Heart sounds: No murmur heard.   No friction rub.  Pulmonary:     Effort: Pulmonary effort is normal. No respiratory distress.     Breath sounds: Normal breath sounds. No wheezing or rales.  Abdominal:     General: Bowel sounds are normal. There is no distension.     Palpations: Abdomen is soft.     Tenderness: There is no abdominal tenderness.  Musculoskeletal:        General: Normal range of motion.     Cervical back: Normal range of motion and neck supple.     Right lower leg: No tenderness. No edema.     Left lower leg: No tenderness. No edema.  Skin:    General: Skin is warm and dry.  Neurological:     Mental Status: He is alert and oriented to  person, place, and time.     Coordination: Coordination normal.    ED Results / Procedures / Treatments   Labs (all labs ordered are listed, but only abnormal results are displayed) Labs Reviewed  CBC WITH DIFFERENTIAL/PLATELET - Abnormal; Notable for the following components:      Result Value   Monocytes Absolute 1.1 (*)    All other components within normal limits  COMPREHENSIVE METABOLIC PANEL - Abnormal; Notable for the following components:   Potassium 3.3 (*)    Glucose, Bld 142 (*)    Creatinine, Ser 1.33 (*)    AST 63 (*)    ALT 130 (*)    All other components within normal limits  BRAIN NATRIURETIC PEPTIDE - Abnormal; Notable for the following components:   B Natriuretic Peptide 536.0 (*)    All other components within normal limits  TROPONIN I (HIGH SENSITIVITY) - Abnormal; Notable for the following components:   Troponin I (High Sensitivity) 33 (*)    All other components within normal limits  TROPONIN I (HIGH SENSITIVITY)    EKG EKG Interpretation  Date/Time:  Tuesday October 14 2020 02:06:43 EDT Ventricular Rate:  103 PR Interval:  130 QRS Duration: 86 QT Interval:  368 QTC Calculation: 482 R Axis:   78 Text Interpretation: Sinus tachycardia Right atrial enlargement Nonspecific ST and T wave abnormality Abnormal ECG No significant change since 08/08/2020 Confirmed by Geoffery Lyons ([id number]) on 10/14/2020 4:32:55 AM  Radiology DG Chest Portable 1 View  Result Date: 10/14/2020 CLINICAL DATA:  Chest pain, shortness of breath EXAM: PORTABLE CHEST 1 VIEW COMPARISON:  08/08/2020 FINDINGS: The heart size and mediastinal contours are within normal limits. Both lungs are clear. The visualized skeletal structures are unremarkable. IMPRESSION: Normal study. Electronically Signed   By: Charlett Nose M.D.   On: 10/14/2020 02:14    Procedures Procedures   Medications Ordered in ED Medications  lisinopril (ZESTRIL) tablet 40 mg (has no administration in time range)  carvedilol (COREG) tablet 25 mg (has no administration in time range)  metoprolol tartrate (LOPRESSOR) tablet 25 mg (has no administration in time range)  oxyCODONE-acetaminophen (PERCOCET/ROXICET) 5-325 MG per tablet 2 tablet (has no administration in time range)    ED Course  I have reviewed the triage vital signs and the nursing notes.  Pertinent labs & imaging results that were available during my care of the patient were reviewed by me and considered in my medical decision making (see chart for details).    MDM Rules/Calculators/A&P  Patient with significant past medical history as described in the HPI.  He presents today for evaluation of chest discomfort.  Patient has been off of his blood pressure medications for the past week as he ran out of them.  He began having chest pain 3 days ago.    He arrives here with stable vital signs, but blood pressure is markedly elevated.  [name] work-up is unremarkable including negative troponin x2 and his EKG is unchanged.  Remainder the work-up is also unremarkable.  He was given his usual blood pressure medications and blood pressure has now significantly improved and his discomfort has resolved.  I suspect the patient was experiencing discomfort related to uncontrolled hypertension as nothing has turned up cardiac.  Patient to be discharged with refills of his blood pressure medications and albuterol.  He is to follow-up with primary doctor.  Final Clinical Impression(s) / ED Diagnoses Final diagnoses:  None    Rx / DC Orders ED Discharge Orders     None        Geoffery Lyons, MD 10/14/20 0630

## 2020-10-14 NOTE — ED Triage Notes (Signed)
Patient reports central chest pain/tightness with SOB ( Hx CHF/COPD) onset Saturday , mild nausea and diaphoresis . No cough or fever .

## 2020-10-21 ENCOUNTER — Emergency Department (HOSPITAL_COMMUNITY)
Admission: EM | Admit: 2020-10-21 | Discharge: 2020-10-21 | Discharge status: Against Medical Advice | Payer: Self-pay | Attending: Emergency Medicine | Admitting: Emergency Medicine

## 2020-10-21 ENCOUNTER — Emergency Department (HOSPITAL_COMMUNITY): Payer: Self-pay

## 2020-10-21 DIAGNOSIS — R072 Precordial pain: Secondary | ICD-10-CM | POA: Insufficient documentation

## 2020-10-21 DIAGNOSIS — R0602 Shortness of breath: Secondary | ICD-10-CM

## 2020-10-21 DIAGNOSIS — Z20822 Contact with and (suspected) exposure to covid-19: Secondary | ICD-10-CM | POA: Insufficient documentation

## 2020-10-21 DIAGNOSIS — F17210 Nicotine dependence, cigarettes, uncomplicated: Secondary | ICD-10-CM | POA: Insufficient documentation

## 2020-10-21 DIAGNOSIS — I2510 Atherosclerotic heart disease of native coronary artery without angina pectoris: Secondary | ICD-10-CM | POA: Insufficient documentation

## 2020-10-21 DIAGNOSIS — I5043 Acute on chronic combined systolic (congestive) and diastolic (congestive) heart failure: Secondary | ICD-10-CM | POA: Insufficient documentation

## 2020-10-21 DIAGNOSIS — R600 Localized edema: Secondary | ICD-10-CM | POA: Insufficient documentation

## 2020-10-21 DIAGNOSIS — R0609 Other forms of dyspnea: Secondary | ICD-10-CM | POA: Insufficient documentation

## 2020-10-21 DIAGNOSIS — I110 Hypertensive heart disease with heart failure: Secondary | ICD-10-CM | POA: Insufficient documentation

## 2020-10-21 DIAGNOSIS — J441 Chronic obstructive pulmonary disease with (acute) exacerbation: Secondary | ICD-10-CM | POA: Insufficient documentation

## 2020-10-21 LAB — CBC
HCT: 46.2 % (ref 39.0–52.0)
Hemoglobin: 15.3 g/dL (ref 13.0–17.0)
MCH: 31.9 pg (ref 26.0–34.0)
MCHC: 33.1 g/dL (ref 30.0–36.0)
MCV: 96.5 fL (ref 80.0–100.0)
Platelets: 295 10*3/uL (ref 150–400)
RBC: 4.79 MIL/uL (ref 4.22–5.81)
RDW: 12.9 % (ref 11.5–15.5)
WBC: 8.1 10*3/uL (ref 4.0–10.5)
nRBC: 0 % (ref 0.0–0.2)

## 2020-10-21 LAB — HEPATIC FUNCTION PANEL
ALT: 147 U/L — ABNORMAL HIGH (ref 0–44)
AST: 68 U/L — ABNORMAL HIGH (ref 15–41)
Albumin: 3.6 g/dL (ref 3.5–5.0)
Alkaline Phosphatase: 67 U/L (ref 38–126)
Bilirubin, Direct: <0.1 mg/dL (ref 0.0–0.2)
Total Bilirubin: 0.7 mg/dL (ref 0.3–1.2)
Total Protein: 6.8 g/dL (ref 6.5–8.1)

## 2020-10-21 LAB — BASIC METABOLIC PANEL
Anion gap: 8 (ref 5–15)
BUN: 9 mg/dL (ref 6–20)
CO2: 29 mmol/L (ref 22–32)
Calcium: 9.4 mg/dL (ref 8.9–10.3)
Chloride: 100 mmol/L (ref 98–111)
Creatinine, Ser: 1.06 mg/dL (ref 0.61–1.24)
GFR, Estimated: >60 mL/min (ref >60)
Glucose, Bld: 95 mg/dL (ref 70–99)
Potassium: 3.9 mmol/L (ref 3.5–5.1)
Sodium: 137 mmol/L (ref 135–145)

## 2020-10-21 LAB — RESP PANEL BY RT-PCR (FLU A&B, COVID) ARPGX2
Influenza A by PCR: NEGATIVE
Influenza B by PCR: NEGATIVE
SARS Coronavirus 2 by RT PCR: NEGATIVE

## 2020-10-21 LAB — BRAIN NATRIURETIC PEPTIDE: B Natriuretic Peptide: 486.2 pg/mL — ABNORMAL HIGH (ref 0.0–100.0)

## 2020-10-21 LAB — TROPONIN I (HIGH SENSITIVITY): Troponin I (High Sensitivity): 20 ng/L — ABNORMAL HIGH (ref <18)

## 2020-10-21 LAB — LIPASE, BLOOD: Lipase: 24 U/L (ref 11–51)

## 2020-10-21 MED ORDER — IPRATROPIUM-ALBUTEROL 0.5-2.5 (3) MG/3ML IN SOLN
3.0000 mL | Freq: Once | RESPIRATORY_TRACT | Status: AC
  Administered 2020-10-21: 3 mL via RESPIRATORY_TRACT
  Filled 2020-10-21: qty 3

## 2020-10-21 NOTE — ED Notes (Signed)
Lab states they did not receive blood that RN sent at 9:22, EDP made aware

## 2020-10-21 NOTE — Discharge Instructions (Addendum)
You were seen in the emergency room today with chest discomfort and shortness of breath.  You did not stay to complete your work-up.  We could be missing a heart attack.  We discussed this.  You are encouraged to return if you change your mind about further testing especially if your symptoms get worse.  Please use your inhalers as needed.  Please call your primary care doctor and your heart doctors for follow-up appointment.

## 2020-10-21 NOTE — ED Notes (Signed)
PT requesting to leave and upset about wait time. EDP made aware

## 2020-10-21 NOTE — ED Triage Notes (Signed)
Pt arrives for eval of continued shob x 1 week. Seen for same on 6/28 and has not improved since. Hx COPD and CHF. Endorses pain between shoulder blades.

## 2020-10-21 NOTE — ED Provider Notes (Signed)
Emergency Department Provider Note   I have reviewed the triage vital signs and the nursing notes.   HISTORY  Chief Complaint Shortness of Breath   HPI Francisco [name] is a 60 y.o. male with past medical history reviewed below including COPD, CHF (EF 35-40%), HTN presents to the emergency department with worsening dyspnea on exertion.  He has some associated chest tightness.  He was seen in the emergency department 1 week prior with similar symptoms.  At that time he had been out of his blood pressure medications but ultimately felt well enough to go home.  He has since obtained refills of his medicines and his back to taking them.  He is not having fevers or chills.  He has noticed significant dyspnea on exertion.  He did make it into work today but states that he felt very short of breath and ultimately had to leave for evaluation.  Past Medical History:  Diagnosis Date   CHF (congestive heart failure) (HCC)    COPD (chronic obstructive pulmonary disease) (HCC)    Coronary artery disease    a. s/p prior PCI.   Hepatitis-C    Hypertension    IV drug abuse (HCC)    a. 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   Medically noncompliant    MI (myocardial infarction) (HCC)    Tobacco abuse     Patient Active Problem List   Diagnosis Date Noted   Polysubstance abuse (HCC) 08/10/2020   Acute on chronic heart failure with preserved ejection fraction (HFpEF) (HCC) 08/09/2020   NSTEMI (non-ST elevated myocardial infarction) (HCC) 08/09/2020   Prolonged QT interval 08/09/2020   CHF, acute on chronic (HCC) 08/08/2020   SOB (shortness of breath)    Hypertensive urgency    Acute on chronic respiratory failure with hypoxia and hypercapnia (HCC) 01/10/2020   COPD exacerbation (HCC) 01/09/2020   Hypotension 08/18/2019   Acute on chronic combined systolic and diastolic CHF (congestive heart failure) (HCC) 08/17/2019   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   Chronic obstructive  pulmonary disease (HCC)    Troponin I above reference range    Unstable angina (HCC)    Dilated cardiomyopathy (HCC)    CAD S/P percutaneous coronary angioplasty    ACS (acute coronary syndrome) (HCC) 04/29/2019   Acute respiratory failure with hypoxia (HCC) 06/17/2018    Past Surgical History:  Procedure Laterality Date   BACK SURGERY     NECK SURGERY     RIGHT/LEFT HEART CATH AND CORONARY ANGIOGRAPHY N/A 04/30/2019   Procedure: RIGHT/LEFT HEART CATH AND CORONARY ANGIOGRAPHY;  Surgeon: Marykay Lex, MD;  Location: [facility] INVASIVE CV LAB;  Service: Cardiovascular;  Laterality: N/A;   stints      Allergies Ivp dye [iodinated diagnostic agents] and Tramadol  Family History  Problem Relation Age of Onset   Rheum arthritis Mother     Social History Social History   Tobacco Use   Smoking status: Every Day    Packs/day: 0.75    Pack years: 0.00    Types: Cigarettes   Smokeless tobacco: Never  Vaping Use   Vaping Use: Never used  Substance Use Topics   Alcohol use: Never   Drug use: Yes    Types: Methamphetamines    Comment: heroin    Review of Systems  Constitutional: No fever/chills Eyes: No visual changes. ENT: No sore throat. Cardiovascular: Denies chest pain. Respiratory: Positive shortness of breath especially with exertion.  Gastrointestinal: No abdominal pain.  No nausea, no vomiting.  No  diarrhea.  No constipation. Genitourinary: Negative for dysuria. Musculoskeletal: Negative for back pain. Skin: Negative for rash. Neurological: Negative for headaches, focal weakness or numbness.  10-point ROS otherwise negative.  ____________________________________________   PHYSICAL EXAM:  VITAL SIGNS: ED Triage Vitals  Enc Vitals Group     BP 10/21/20 0836 (!) 179/109     Pulse Rate 10/21/20 0836 80     Resp 10/21/20 0836 20     Temp 10/21/20 0836 98.1 F (36.7 C)     Temp Source 10/21/20 0836 Oral     SpO2 10/21/20 0836 97 %     Weight 10/21/20 0910 209  lb 7 oz (95 kg)     Height 10/21/20 0910 6' (1.829 m)   Constitutional: Alert and oriented. Well appearing and in no acute distress. Eyes: Conjunctivae are normal.  Head: Atraumatic. Nose: No congestion/rhinnorhea. Mouth/Throat: Mucous membranes are moist.   Neck: No stridor.  Cardiovascular: Normal rate, regular rhythm. Good peripheral circulation. Grossly normal heart sounds.   Respiratory: Normal respiratory effort at rest.  No retractions. Lungs with faint end-expiratory wheezing at the bases. No rales.  Gastrointestinal: Soft and nontender. No distention.  Musculoskeletal: No lower extremity tenderness with 1+ pitting edema in the bilateral LEs. No gross deformities of extremities. Neurologic:  Normal speech and language. No gross focal neurologic deficits are appreciated.  Skin:  Skin is warm, dry and intact. No rash noted.   ____________________________________________   LABS (all labs ordered are listed, but only abnormal results are displayed)  Labs Reviewed  BRAIN NATRIURETIC PEPTIDE - Abnormal; Notable for the following components:      Result Value   B Natriuretic Peptide 486.2 (*)    All other components within normal limits  HEPATIC FUNCTION PANEL - Abnormal; Notable for the following components:   AST 68 (*)    ALT 147 (*)    All other components within normal limits  TROPONIN I (HIGH SENSITIVITY) - Abnormal; Notable for the following components:   Troponin I (High Sensitivity) 20 (*)    All other components within normal limits  RESP PANEL BY RT-PCR (FLU A&B, COVID) ARPGX2  CBC  BASIC METABOLIC PANEL  LIPASE, BLOOD   ____________________________________________  EKG   EKG Interpretation  Date/Time:  Tuesday October 21 2020 08:38:42 EDT Ventricular Rate:  76 PR Interval:  136 QRS Duration: 86 QT Interval:  388 QTC Calculation: 436 R Axis:   65 Text Interpretation: Normal sinus rhythm Nonspecific T wave abnormality Abnormal ECG Confirmed by Tilden Fossa [phone]) on 10/21/2020 8:53:05 AM         ____________________________________________  RADIOLOGY  DG Chest 2 View  Result Date: 10/21/2020 CLINICAL DATA:  Shortness of breath, chest pain. EXAM: CHEST - 2 VIEW COMPARISON:  October 14, 2020. FINDINGS: The heart size and mediastinal contours are within normal limits. Both lungs are clear. The visualized skeletal structures are unremarkable. IMPRESSION: No active cardiopulmonary disease. Electronically Signed   By: Lupita Raider M.D.   On: 10/21/2020 09:14    ____________________________________________   PROCEDURES  Procedure(s) performed:   Procedures  None ____________________________________________   INITIAL IMPRESSION / ASSESSMENT AND PLAN / ED COURSE  Pertinent labs & imaging results that were available during my care of the patient were reviewed by me and considered in my medical decision making (see chart for details).   Patient presents to the emergency department with shortness of breath symptoms worsening over the past several days.  Symptoms are mainly with exertion.  At  rest the patient appears comfortable and speaking in complete sentences with normal oxygen saturation.  He does have chronic respiratory disease including COPD.  He has been using inhalers frequently at home along with his nebulizer machine.  He is compliant with his 40 mg of Lasix daily.  Mild swelling in the legs.  Chest x-ray reviewed with no acute findings.  Plan for COVID swab, labs including BNP.   01:52 PM  Patient is refusing to have his second troponin drawn.  His first 1 was very mildly elevated at 20.  I discussed that his symptoms may be related to injury to his heart or CHF.  He is refusing to stay for the second heart enzyme.  He would like to be discharged AGAINST MEDICAL ADVICE.  We discussed that this could lead to missing a heart attack and I encouraged him to stay but he is  refusing. ____________________________________________  FINAL CLINICAL IMPRESSION(S) / ED DIAGNOSES  Final diagnoses:  Shortness of breath  Precordial chest pain     MEDICATIONS GIVEN DURING THIS VISIT:  Medications  ipratropium-albuterol (DUONEB) 0.5-2.5 (3) MG/3ML nebulizer solution 3 mL (3 mLs Nebulization Given 10/21/20 1159)   Note:  This document was prepared using Dragon voice recognition software and may include unintentional dictation errors.  Alona Bene, MD, [facility] Emergency Medicine    Luberta Grabinski, Arlyss Repress, MD 10/26/20 [phone]

## 2020-12-16 ENCOUNTER — Emergency Department (HOSPITAL_COMMUNITY): Payer: Self-pay

## 2020-12-16 ENCOUNTER — Emergency Department (HOSPITAL_COMMUNITY)
Admission: EM | Admit: 2020-12-16 | Discharge: 2020-12-17 | Disposition: A | Discharge status: Home / Self Care | Payer: Self-pay | Attending: Emergency Medicine | Admitting: Emergency Medicine

## 2020-12-16 DIAGNOSIS — J441 Chronic obstructive pulmonary disease with (acute) exacerbation: Secondary | ICD-10-CM | POA: Insufficient documentation

## 2020-12-16 DIAGNOSIS — F17210 Nicotine dependence, cigarettes, uncomplicated: Secondary | ICD-10-CM | POA: Insufficient documentation

## 2020-12-16 DIAGNOSIS — I5043 Acute on chronic combined systolic (congestive) and diastolic (congestive) heart failure: Secondary | ICD-10-CM | POA: Insufficient documentation

## 2020-12-16 DIAGNOSIS — I2510 Atherosclerotic heart disease of native coronary artery without angina pectoris: Secondary | ICD-10-CM | POA: Insufficient documentation

## 2020-12-16 DIAGNOSIS — I10 Essential (primary) hypertension: Secondary | ICD-10-CM

## 2020-12-16 DIAGNOSIS — Z7982 Long term (current) use of aspirin: Secondary | ICD-10-CM | POA: Insufficient documentation

## 2020-12-16 DIAGNOSIS — Z79899 Other long term (current) drug therapy: Secondary | ICD-10-CM | POA: Insufficient documentation

## 2020-12-16 DIAGNOSIS — I110 Hypertensive heart disease with heart failure: Secondary | ICD-10-CM | POA: Insufficient documentation

## 2020-12-16 LAB — CBC
HCT: 47.6 % (ref 39.0–52.0)
Hemoglobin: 15.9 g/dL (ref 13.0–17.0)
MCH: 31.9 pg (ref 26.0–34.0)
MCHC: 33.4 g/dL (ref 30.0–36.0)
MCV: 95.4 fL (ref 80.0–100.0)
Platelets: 259 10*3/uL (ref 150–400)
RBC: 4.99 MIL/uL (ref 4.22–5.81)
RDW: 12.9 % (ref 11.5–15.5)
WBC: 6.1 10*3/uL (ref 4.0–10.5)
nRBC: 0 % (ref 0.0–0.2)

## 2020-12-16 NOTE — ED Triage Notes (Signed)
Pt c/o SHOB & chest pain/tighteness since this weekend. Compliant w home dose Lasix. Trace BLE, wet cough noted in triage. States he feels he's "got fluid"

## 2020-12-16 NOTE — ED Provider Notes (Signed)
Emergency Medicine Provider Triage Evaluation Note  Pavle Wiler , a 60 y.o. male  was evaluated in triage.  Pt complains of sob and chest tightness.  Review of Systems  Positive: Sob, chest tightness Negative: fevers  Physical Exam  BP (!) 182/102 (BP Location: Right Arm)   Pulse (!) 118   Temp 98.1 F (36.7 C) (Oral)   Resp 20   SpO2 95%  Gen:   Awake, no distress   Resp:  tachypneic MSK:   Moves extremities without difficulty  Other:  Decreased air movement with prolonged expiratory phase, trace ble edema  Medical Decision Making  Medically screening exam initiated at 9:19 PM.  Appropriate orders placed.  Draylen Lobue was informed that the remainder of the evaluation will be completed by another provider, this initial triage assessment does not replace that evaluation, and the importance of remaining in the ED until their evaluation is complete.     Rayne Du 12/16/20 2120    Melene Plan, DO 12/16/20 2246

## 2020-12-17 ENCOUNTER — Emergency Department (HOSPITAL_COMMUNITY): Payer: Self-pay

## 2020-12-17 LAB — BASIC METABOLIC PANEL
Anion gap: 10 (ref 5–15)
BUN: 10 mg/dL (ref 6–20)
CO2: 29 mmol/L (ref 22–32)
Calcium: 9.5 mg/dL (ref 8.9–10.3)
Chloride: 99 mmol/L (ref 98–111)
Creatinine, Ser: 1.12 mg/dL (ref 0.61–1.24)
GFR, Estimated: >60 mL/min (ref >60)
Glucose, Bld: 110 mg/dL — ABNORMAL HIGH (ref 70–99)
Potassium: 3.6 mmol/L (ref 3.5–5.1)
Sodium: 138 mmol/L (ref 135–145)

## 2020-12-17 LAB — TROPONIN I (HIGH SENSITIVITY)
Troponin I (High Sensitivity): 23 ng/L — ABNORMAL HIGH (ref <18)
Troponin I (High Sensitivity): 25 ng/L — ABNORMAL HIGH (ref <18)

## 2020-12-17 LAB — BRAIN NATRIURETIC PEPTIDE: B Natriuretic Peptide: 172.4 pg/mL — ABNORMAL HIGH (ref 0.0–100.0)

## 2020-12-17 MED ORDER — METHYLPREDNISOLONE SODIUM SUCC 125 MG IJ SOLR
125.0000 mg | Freq: Once | INTRAMUSCULAR | Status: AC
  Administered 2020-12-17: 125 mg via INTRAVENOUS
  Filled 2020-12-17: qty 2

## 2020-12-17 MED ORDER — PREDNISONE 10 MG PO TABS: 40.0000 mg | ORAL_TABLET | Freq: Every day | ORAL | 0 refills | Status: AC

## 2020-12-17 MED ORDER — ALBUTEROL SULFATE HFA 108 (90 BASE) MCG/ACT IN AERS
6.0000 | INHALATION_SPRAY | Freq: Once | RESPIRATORY_TRACT | Status: AC
  Administered 2020-12-17: 6 via RESPIRATORY_TRACT
  Filled 2020-12-17: qty 6.7

## 2020-12-17 MED ORDER — IPRATROPIUM BROMIDE HFA 17 MCG/ACT IN AERS
2.0000 | INHALATION_SPRAY | Freq: Once | RESPIRATORY_TRACT | Status: AC
  Administered 2020-12-17: 2 via RESPIRATORY_TRACT
  Filled 2020-12-17: qty 12.9

## 2020-12-17 MED ORDER — LISINOPRIL 40 MG PO TABS: ORAL_TABLET | Freq: Every day | ORAL | 0 refills | Status: DC

## 2020-12-17 MED ORDER — METOPROLOL SUCCINATE ER 25 MG PO TB24
25.0000 mg | ORAL_TABLET | Freq: Every day | ORAL | 0 refills | Status: DC
Start: 2020-12-17 — End: 2020-12-17

## 2020-12-17 MED ORDER — FUROSEMIDE 40 MG PO TABS: 40.0000 mg | ORAL_TABLET | Freq: Two times a day (BID) | ORAL | 1 refills | Status: DC

## 2020-12-17 NOTE — ED Provider Notes (Signed)
[facility] Provider Note  CSN: [id number] Arrival date & time: 12/16/20 2105  Chief Complaint(s) Shortness of Breath  HPI Francisco FolkWayne [name] is a 60 y.o. male with a past medical history listed below here for several days of gradually worsening shortness of breath.  Patient reports running out of his home prescription medication as well as albuterol. Reports that he is compliant with his medications. Denies recent fevers or infections. He denies any associated chest pain. Admits to continued methamphetamine use, last of which was yesterday.  Additionally complained of twisting his right knee earlier today on carpet while trying to pivot.  Denies any falls.  Pain is aching and throbbing.  Worse with ambulation.   Shortness of Breath  Past Medical History Past Medical History:  Diagnosis Date   CHF (congestive heart failure) (HCC)    COPD (chronic obstructive pulmonary disease) (HCC)    Coronary artery disease    a. s/p prior PCI.   Hepatitis-C    Hypertension    IV drug abuse (HCC)    a. 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   Medically noncompliant    MI (myocardial infarction) (HCC)    Tobacco abuse    Patient Active Problem List   Diagnosis Date Noted   Polysubstance abuse (HCC) 08/10/2020   Acute on chronic heart failure with preserved ejection fraction (HFpEF) (HCC) 08/09/2020   NSTEMI (non-ST elevated myocardial infarction) (HCC) 08/09/2020   Prolonged QT interval 08/09/2020   CHF, acute on chronic (HCC) 08/08/2020   SOB (shortness of breath)    Hypertensive urgency    Acute on chronic respiratory failure with hypoxia and hypercapnia (HCC) 01/10/2020   COPD exacerbation (HCC) 01/09/2020   Hypotension 08/18/2019   Acute on chronic combined systolic and diastolic CHF (congestive heart failure) (HCC) 08/17/2019   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   Chronic obstructive pulmonary disease (HCC)    Troponin I above  reference range    Unstable angina (HCC)    Dilated cardiomyopathy (HCC)    CAD S/P percutaneous coronary angioplasty    ACS (acute coronary syndrome) (HCC) 04/29/2019   Acute respiratory failure with hypoxia (HCC) 06/17/2018   Home Medication(s) Prior to Admission medications   Medication Sig Start Date End Date Taking? Authorizing Provider  predniSONE (DELTASONE) 10 MG tablet Take 4 tablets (40 mg total) by mouth daily for 4 days. 12/17/20 12/21/20 Yes Lucca Greggs, Amadeo GarnetPedro Eduardo, MD  albuterol (VENTOLIN HFA) 108 (90 Base) MCG/ACT inhaler Inhale 2 puffs into the lungs every 4 (four) hours as needed for wheezing or shortness of breath. 10/14/20   Geoffery Lyonselo, Douglas, MD  aspirin EC 81 MG tablet Take 1 tablet (81 mg total) by mouth daily. 01/04/20   Tilden Fossaees, Elizabeth, MD  atorvastatin (LIPITOR) 40 MG tablet Take 1 tablet (40 mg total) by mouth daily. 08/11/20   Alessandra BevelsKamineni, Neelima, MD  atorvastatin (LIPITOR) 80 MG tablet TAKE 1 TABLET (80 MG TOTAL) BY MOUTH DAILY AT 6 PM. Patient not taking: Reported on 10/14/2020 05/06/20 05/06/21  Lorelee NewGreen, Garrett L, PA-C  atorvastatin (LIPITOR) 80 MG tablet TAKE 1 TABLET (80 MG TOTAL) BY MOUTH DAILY AT 6 PM. Patient not taking: Reported on 10/14/2020 01/17/20 01/16/21  Anders SimmondsMcClung, Angela M, PA-C  azithromycin (ZITHROMAX) 250 MG tablet TAKE 2 TABLETS BY MOUTH ON DAY, TAKE 1 TABLET ON DAYS 2 THROUGH 5. Patient not taking: Reported on 10/14/2020 05/06/20 05/06/21  Lorelee NewGreen, Garrett L, PA-C  carvedilol (COREG) 25 MG tablet TAKE 1 TABLET (25 MG TOTAL) BY MOUTH 2 (  TWO) TIMES DAILY WITH A MEAL. 10/14/20 10/14/21  Geoffery Lyons, MD  furosemide (LASIX) 40 MG tablet Take 1 tablet (40 mg total) by mouth 2 (two) times daily. 12/17/20   Nira Conn, MD  lisinopril (ZESTRIL) 40 MG tablet TAKE 1 TABLET (40 MG TOTAL) BY MOUTH DAILY. 12/17/20 12/17/21  Nira Conn, MD  nitroGLYCERIN (NITROSTAT) 0.4 MG SL tablet Place 1 tablet (0.4 mg total) under the tongue every 5 (five) minutes x 3 doses as  needed for chest pain. 08/10/20   Alessandra Bevels, MD  potassium chloride (KLOR-CON) 10 MEQ tablet Take 1 tablet (10 mEq total) by mouth 2 (two) times daily. 08/10/20   Alessandra Bevels, MD                                                                                                                                    Past Surgical History Past Surgical History:  Procedure Laterality Date   BACK SURGERY     NECK SURGERY     RIGHT/LEFT HEART CATH AND CORONARY ANGIOGRAPHY N/A 04/30/2019   Procedure: RIGHT/LEFT HEART CATH AND CORONARY ANGIOGRAPHY;  Surgeon: Marykay Lex, MD;  Location: [facility] INVASIVE CV LAB;  Service: Cardiovascular;  Laterality: N/A;   stints     Family History Family History  Problem Relation Age of Onset   Rheum arthritis Mother     Social History Social History   Tobacco Use   Smoking status: Every Day    Packs/day: 0.75    Types: Cigarettes   Smokeless tobacco: Never  Vaping Use   Vaping Use: Never used  Substance Use Topics   Alcohol use: Never   Drug use: Yes    Types: Methamphetamines    Comment: heroin   Allergies Ivp dye [iodinated diagnostic agents] and Tramadol  Review of Systems Review of Systems  Respiratory:  Positive for shortness of breath.   All other systems are reviewed and are negative for acute change except as noted in the HPI  Physical Exam Vital Signs  I have reviewed the triage vital signs BP (!) 182/102 (BP Location: Right Arm)   Pulse (!) 118   Temp 98.1 F (36.7 C) (Oral)   Resp 20   SpO2 95%   Physical Exam Vitals reviewed.  Constitutional:      General: He is not in acute distress.    Appearance: He is well-developed. He is not diaphoretic.  HENT:     Head: Normocephalic and atraumatic.     Nose: Nose normal.  Eyes:     General: No scleral icterus.       Right eye: No discharge.        Left eye: No discharge.     Conjunctiva/sclera: Conjunctivae normal.     Pupils: Pupils are equal, round, and reactive to  light.  Cardiovascular:     Rate and Rhythm: Regular rhythm. Tachycardia present.     Heart  sounds: No murmur heard.   No friction rub. No gallop.  Pulmonary:     Effort: Pulmonary effort is normal. No respiratory distress.     Breath sounds: No stridor. Wheezing (diffuse insp and exp) present. No rales.  Abdominal:     General: There is no distension.     Palpations: Abdomen is soft.     Tenderness: There is no abdominal tenderness.  Musculoskeletal:        General: No tenderness.     Cervical back: Normal range of motion and neck supple.  Skin:    General: Skin is warm and dry.     Findings: No erythema or rash.  Neurological:     Mental Status: He is alert and oriented to person, place, and time.    ED Results and Treatments Labs (all labs ordered are listed, but only abnormal results are displayed) Labs Reviewed  BASIC METABOLIC PANEL - Abnormal; Notable for the following components:      Result Value   Glucose, Bld 110 (*)    All other components within normal limits  BRAIN NATRIURETIC PEPTIDE - Abnormal; Notable for the following components:   B Natriuretic Peptide 172.4 (*)    All other components within normal limits  TROPONIN I (HIGH SENSITIVITY) - Abnormal; Notable for the following components:   Troponin I (High Sensitivity) 23 (*)    All other components within normal limits  TROPONIN I (HIGH SENSITIVITY) - Abnormal; Notable for the following components:   Troponin I (High Sensitivity) 25 (*)    All other components within normal limits  CBC                                                                                                                         EKG  EKG Interpretation  Date/Time:  Tuesday December 16 2020 21:13:11 EDT Ventricular Rate:  121 PR Interval:  134 QRS Duration: 82 QT Interval:  330 QTC Calculation: 468 R Axis:   75 Text Interpretation: Sinus tachycardia Right atrial enlargement Premature atrial complexes Nonspecific ST abnormality  related to rate Abnormal ECG Confirmed by Drema Pry ([id number]) on 12/17/2020 12:43:56 AM Also confirmed by Drema Pry [phone]), editor Elita Quick ([id number])  on 12/17/2020 11:04:13 AM       Radiology No results found.  Pertinent labs & imaging results that were available during my care of the patient were reviewed by me and considered in my medical decision making (see MDM for details).  Medications Ordered in ED Medications  ipratropium (ATROVENT HFA) inhaler 2 puff (2 puffs Inhalation Given 12/17/20 0054)  methylPREDNISolone sodium succinate (SOLU-MEDROL) 125 mg/2 mL injection 125 mg (125 mg Intravenous Given 12/17/20 0052)  albuterol (VENTOLIN HFA) 108 (90 Base) MCG/ACT inhaler 6 puff (6 puffs Inhalation Given 12/17/20 0054)  Procedures .1-3 Lead EKG Interpretation  Date/Time: 12/18/2020 7:14 PM Performed by: Nira Conn, MD Authorized by: Nira Conn, MD     Interpretation: normal     ECG rate:  106   ECG rate assessment: tachycardic     Rhythm: sinus tachycardia     Ectopy: none     Conduction: normal    (including critical care time)  Medical Decision Making / ED Course I have reviewed the nursing notes for this encounter and the patient's prior records (if available in EHR or on provided paperwork).  Vandy Tsuchiya was evaluated in Emergency Department on 12/18/2020 for the symptoms described in the history of present illness. He was evaluated in the context of the global COVID-19 pandemic, which necessitated consideration that the patient might be at risk for infection with the SARS-CoV-2 virus that causes COVID-19. Institutional protocols and algorithms that pertain to the evaluation of patients at risk for COVID-19 are in a state of rapid change based on information released by regulatory bodies including the CDC and  federal and state organizations. These policies and algorithms were followed during the patient's care in the ED.  Clinical Course as of 12/18/20 1919  Wed Dec 17, 2020  0045 Presentation is most suspicious for COPD exacerbation. Will rule out heart failure exacerbation but patient does not appear to be volume overloaded on exam.  Chest x-ray without evidence of pneumonia, pulmonary edema, pneumothorax. EKG with sinus tachycardia.  Nonspecific ST changes related to rate.   CBC without leukocytosis or anemia. No significant electrolyte derangements or renal sufficiency.  Initial troponin slightly elevated -but stable for his baseline. BnP reassuring, less than 200.  Significantly improved from last month.  Work-up does appear to favor COPD exacerbation. Will treat with breathing treatments and steroids.  Will also obtain plain film of the right knee. [PC]  0302 After breathing treatments patient's work of breathing and wheezing improved.  Plain film of the left knee negative.    [PC]    Clinical Course User Index [PC] Laquinta Hazell, Amadeo Garnet, MD    Pertinent labs & imaging results that were available during my care of the patient were reviewed by me and considered in my medical decision making:   Final Clinical Impression(s) / ED Diagnoses Final diagnoses:  HTN (hypertension), malignant  COPD exacerbation (HCC)   The patient appears reasonably screened and/or stabilized for discharge and I doubt any other medical condition or other [facility] requiring further screening, evaluation, or treatment in the ED at this time prior to discharge. Safe for discharge with strict return precautions.  Disposition: Discharge  Condition: Good  I have discussed the results, Dx and Tx plan with the patient/family who expressed understanding and agree(s) with the plan. Discharge instructions discussed at length. The patient/family was given strict return precautions who verbalized understanding of  the instructions. No further questions at time of discharge.    ED Discharge Orders          Ordered    furosemide (LASIX) 40 MG tablet  2 times daily        12/17/20 0343    lisinopril (ZESTRIL) 40 MG tablet  Daily        12/17/20 0343    metoprolol succinate (TOPROL-XL) 25 MG 24 hr tablet  Daily,   Status:  Discontinued        12/17/20 0343    predniSONE (DELTASONE) 10 MG tablet  Daily        12/17/20 0343  This chart was dictated using voice recognition software.  Despite best efforts to proofread,  errors can occur which can change the documentation meaning.    Nira Conn, MD 12/18/20 1919

## 2020-12-30 ENCOUNTER — Emergency Department (HOSPITAL_COMMUNITY)
Admission: EM | Admit: 2020-12-30 | Discharge: 2020-12-31 | Disposition: A | Discharge status: Home / Self Care | Payer: Self-pay | Attending: Emergency Medicine | Admitting: Emergency Medicine

## 2020-12-30 ENCOUNTER — Emergency Department (HOSPITAL_COMMUNITY): Payer: Self-pay

## 2020-12-30 DIAGNOSIS — I5043 Acute on chronic combined systolic (congestive) and diastolic (congestive) heart failure: Secondary | ICD-10-CM | POA: Insufficient documentation

## 2020-12-30 DIAGNOSIS — Z20822 Contact with and (suspected) exposure to covid-19: Secondary | ICD-10-CM | POA: Insufficient documentation

## 2020-12-30 DIAGNOSIS — Z79899 Other long term (current) drug therapy: Secondary | ICD-10-CM | POA: Insufficient documentation

## 2020-12-30 DIAGNOSIS — R2243 Localized swelling, mass and lump, lower limb, bilateral: Secondary | ICD-10-CM | POA: Insufficient documentation

## 2020-12-30 DIAGNOSIS — R0682 Tachypnea, not elsewhere classified: Secondary | ICD-10-CM | POA: Insufficient documentation

## 2020-12-30 DIAGNOSIS — R0902 Hypoxemia: Secondary | ICD-10-CM | POA: Insufficient documentation

## 2020-12-30 DIAGNOSIS — F17210 Nicotine dependence, cigarettes, uncomplicated: Secondary | ICD-10-CM | POA: Insufficient documentation

## 2020-12-30 DIAGNOSIS — I2510 Atherosclerotic heart disease of native coronary artery without angina pectoris: Secondary | ICD-10-CM | POA: Insufficient documentation

## 2020-12-30 DIAGNOSIS — J441 Chronic obstructive pulmonary disease with (acute) exacerbation: Secondary | ICD-10-CM | POA: Insufficient documentation

## 2020-12-30 DIAGNOSIS — Z7982 Long term (current) use of aspirin: Secondary | ICD-10-CM | POA: Insufficient documentation

## 2020-12-30 DIAGNOSIS — I110 Hypertensive heart disease with heart failure: Secondary | ICD-10-CM | POA: Insufficient documentation

## 2020-12-30 LAB — CBC
HCT: 49.2 % (ref 39.0–52.0)
Hemoglobin: 15.9 g/dL (ref 13.0–17.0)
MCH: 32 pg (ref 26.0–34.0)
MCHC: 32.3 g/dL (ref 30.0–36.0)
MCV: 99 fL (ref 80.0–100.0)
Platelets: 253 10*3/uL (ref 150–400)
RBC: 4.97 MIL/uL (ref 4.22–5.81)
RDW: 12.9 % (ref 11.5–15.5)
WBC: 8.9 10*3/uL (ref 4.0–10.5)
nRBC: 0 % (ref 0.0–0.2)

## 2020-12-30 LAB — BASIC METABOLIC PANEL
Anion gap: 8 (ref 5–15)
BUN: 10 mg/dL (ref 6–20)
CO2: 30 mmol/L (ref 22–32)
Calcium: 9.2 mg/dL (ref 8.9–10.3)
Chloride: 98 mmol/L (ref 98–111)
Creatinine, Ser: 1.17 mg/dL (ref 0.61–1.24)
GFR, Estimated: >60 mL/min (ref >60)
Glucose, Bld: 98 mg/dL (ref 70–99)
Potassium: 4.5 mmol/L (ref 3.5–5.1)
Sodium: 136 mmol/L (ref 135–145)

## 2020-12-30 LAB — TROPONIN I (HIGH SENSITIVITY): Troponin I (High Sensitivity): 28 ng/L — ABNORMAL HIGH (ref <18)

## 2020-12-30 LAB — BRAIN NATRIURETIC PEPTIDE: B Natriuretic Peptide: 337.5 pg/mL — ABNORMAL HIGH (ref 0.0–100.0)

## 2020-12-30 NOTE — ED Triage Notes (Signed)
Pt states he took nitro pill, did not help. Used rescue inhaler, "didn't help like usual"

## 2020-12-30 NOTE — ED Provider Notes (Signed)
Emergency Medicine Provider Triage Evaluation Note  Francisco [name] , a 60 y.o. male  was evaluated in triage.  Pt complains of sob, cough, fevers.  Review of Systems  Positive: Cough, sob, fevers, leg swelling Negative: vomiting  Physical Exam  BP (!) 211/126 (BP Location: Right Arm)   Pulse (!) 109   Temp 98.3 F (36.8 C) (Oral)   Resp (!) 21   SpO2 93%  Gen:   Awake, no distress   Resp:  Normal effort MSK:   Moves extremities without difficulty  Other:  Prolonged expiratory phase with diffuse wheezing, trace ble edema  Medical Decision Making  Medically screening exam initiated at 9:26 PM.  Appropriate orders placed.  Francisco [name] was informed that the remainder of the evaluation will be completed by another provider, this initial triage assessment does not replace that evaluation, and the importance of remaining in the ED until their evaluation is complete.     Samson Frederic, Evany Schecter S, PA-C 12/30/20 2126    Franne Forts, DO 12/31/20 0002

## 2020-12-30 NOTE — ED Triage Notes (Signed)
Pt c/o SHOB & CP/tightness x "a couple days, feels like I've got fluid." Hx COPD, CHF, compliant w medication. Seen recently for same, states this time "feels worse."  8/10 "tightness, someone sitting on my chest"

## 2020-12-31 LAB — RESP PANEL BY RT-PCR (FLU A&B, COVID) ARPGX2
Influenza A by PCR: NEGATIVE
Influenza B by PCR: NEGATIVE
SARS Coronavirus 2 by RT PCR: NEGATIVE

## 2020-12-31 LAB — TROPONIN I (HIGH SENSITIVITY): Troponin I (High Sensitivity): 26 ng/L — ABNORMAL HIGH (ref <18)

## 2020-12-31 MED ORDER — ALBUTEROL SULFATE (2.5 MG/3ML) 0.083% IN NEBU
15.0000 mg/h | INHALATION_SOLUTION | Freq: Once | RESPIRATORY_TRACT | Status: AC
  Administered 2020-12-31: 15 mg/h via RESPIRATORY_TRACT
  Filled 2020-12-31: qty 3

## 2020-12-31 MED ORDER — PREDNISONE 10 MG (21) PO TBPK
ORAL_TABLET | Freq: Every day | ORAL | 0 refills | Status: DC
Start: 2020-12-31 — End: 2021-04-08

## 2020-12-31 MED ORDER — IPRATROPIUM BROMIDE 0.02 % IN SOLN
0.5000 mg | Freq: Once | RESPIRATORY_TRACT | Status: AC
  Administered 2020-12-31: 0.5 mg via RESPIRATORY_TRACT
  Filled 2020-12-31: qty 2.5

## 2020-12-31 MED ORDER — FUROSEMIDE 10 MG/ML IJ SOLN
40.0000 mg | Freq: Once | INTRAMUSCULAR | Status: AC
  Administered 2020-12-31: 40 mg via INTRAVENOUS
  Filled 2020-12-31: qty 4

## 2020-12-31 MED ORDER — ALBUTEROL SULFATE HFA 108 (90 BASE) MCG/ACT IN AERS
4.0000 | INHALATION_SPRAY | Freq: Once | RESPIRATORY_TRACT | Status: AC
  Administered 2020-12-31: 4 via RESPIRATORY_TRACT
  Filled 2020-12-31: qty 6.7

## 2020-12-31 MED ORDER — MAGNESIUM SULFATE 2 GM/50ML IV SOLN
2.0000 g | Freq: Once | INTRAVENOUS | Status: AC
  Administered 2020-12-31: 2 g via INTRAVENOUS
  Filled 2020-12-31: qty 50

## 2020-12-31 MED ORDER — DEXAMETHASONE SODIUM PHOSPHATE 10 MG/ML IJ SOLN
10.0000 mg | Freq: Once | INTRAMUSCULAR | Status: AC
  Administered 2020-12-31: 10 mg via INTRAVENOUS
  Filled 2020-12-31: qty 1

## 2020-12-31 MED ORDER — NITROGLYCERIN 2 % TD OINT
1.0000 [in_us] | TOPICAL_OINTMENT | Freq: Once | TRANSDERMAL | Status: AC
  Administered 2020-12-31: 1 [in_us] via TOPICAL
  Filled 2020-12-31: qty 1

## 2020-12-31 NOTE — ED Notes (Signed)
Prescription given to pt and pt educated on coming back to ED if he feels worse. Pt verbalized understanding.

## 2020-12-31 NOTE — ED Notes (Signed)
Pt noted to have removed all monitoring equipment. Pt adamant about leaving. MD notified and came to bedside to talk w/ pt.

## 2020-12-31 NOTE — ED Notes (Signed)
Went to get the pt a wheelchair and pt was no longer in the room.

## 2020-12-31 NOTE — ED Provider Notes (Addendum)
[facility] EMERGENCY DEPARTMENT Provider Note   CSN: [id number] Arrival date & time: 12/30/20  2112     History Chief Complaint  Patient presents with   Shortness of Breath    Francisco [name] is a 60 y.o. male.  HPI     This is a 60 year old male with history of CHF, COPD, coronary artery disease, hepatitis, hypertension who presents with shortness of breath.  Patient reports several day history of worsening shortness of breath.  He is unsure whether this is his CHF or his COPD.  He reports a productive cough.  Temperature up to 100.0.  Has noted some slight lower extremity edema.  States that he finished a course of steroids recently for COPD exacerbation.  He is not normally on home oxygen.  No known sick contacts or COVID exposures.  He has used an inhaler and nebulizer at home with minimal relief.  Past Medical History:  Diagnosis Date   CHF (congestive heart failure) (HCC)    COPD (chronic obstructive pulmonary disease) (HCC)    Coronary artery disease    a. s/p prior PCI.   Hepatitis-C    Hypertension    IV drug abuse (HCC)    a. 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   Medically noncompliant    MI (myocardial infarction) (HCC)    Tobacco abuse     Patient Active Problem List   Diagnosis Date Noted   Polysubstance abuse (HCC) 08/10/2020   Acute on chronic heart failure with preserved ejection fraction (HFpEF) (HCC) 08/09/2020   NSTEMI (non-ST elevated myocardial infarction) (HCC) 08/09/2020   Prolonged QT interval 08/09/2020   CHF, acute on chronic (HCC) 08/08/2020   SOB (shortness of breath)    Hypertensive urgency    Acute on chronic respiratory failure with hypoxia and hypercapnia (HCC) 01/10/2020   COPD exacerbation (HCC) 01/09/2020   Hypotension 08/18/2019   Acute on chronic combined systolic and diastolic CHF (congestive heart failure) (HCC) 08/17/2019   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   Chronic obstructive pulmonary  disease (HCC)    Troponin I above reference range    Unstable angina (HCC)    Dilated cardiomyopathy (HCC)    CAD S/P percutaneous coronary angioplasty    ACS (acute coronary syndrome) (HCC) 04/29/2019   Acute respiratory failure with hypoxia (HCC) 06/17/2018    Past Surgical History:  Procedure Laterality Date   BACK SURGERY     NECK SURGERY     RIGHT/LEFT HEART CATH AND CORONARY ANGIOGRAPHY N/A 04/30/2019   Procedure: RIGHT/LEFT HEART CATH AND CORONARY ANGIOGRAPHY;  Surgeon: Marykay Lex, MD;  Location: [facility] INVASIVE CV LAB;  Service: Cardiovascular;  Laterality: N/A;   stints         Family History  Problem Relation Age of Onset   Rheum arthritis Mother     Social History   Tobacco Use   Smoking status: Every Day    Packs/day: 0.75    Types: Cigarettes   Smokeless tobacco: Never  Vaping Use   Vaping Use: Never used  Substance Use Topics   Alcohol use: Never   Drug use: Yes    Types: Methamphetamines    Comment: heroin    Home Medications Prior to Admission medications   Medication Sig Start Date End Date Taking? Authorizing Provider  predniSONE (STERAPRED UNI-PAK 21 TAB) 10 MG (21) TBPK tablet Take by mouth daily. Take 6 tabs by mouth daily  for 2 days, then 5 tabs for 2 days, then 4 tabs for 2  days, then 3 tabs for 2 days, 2 tabs for 2 days, then 1 tab by mouth daily for 2 days 12/31/20  Yes Olufemi Mofield, Mayer Masker, MD  albuterol (VENTOLIN HFA) 108 (90 Base) MCG/ACT inhaler Inhale 2 puffs into the lungs every 4 (four) hours as needed for wheezing or shortness of breath. 10/14/20   Geoffery Lyons, MD  aspirin EC 81 MG tablet Take 1 tablet (81 mg total) by mouth daily. 01/04/20   Tilden Fossa, MD  atorvastatin (LIPITOR) 40 MG tablet Take 1 tablet (40 mg total) by mouth daily. 08/11/20   Alessandra Bevels, MD  atorvastatin (LIPITOR) 80 MG tablet TAKE 1 TABLET (80 MG TOTAL) BY MOUTH DAILY AT 6 PM. Patient not taking: Reported on 10/14/2020 05/06/20 05/06/21  Lorelee New,  PA-C  atorvastatin (LIPITOR) 80 MG tablet TAKE 1 TABLET (80 MG TOTAL) BY MOUTH DAILY AT 6 PM. Patient not taking: Reported on 10/14/2020 01/17/20 01/16/21  Anders Simmonds, PA-C  azithromycin (ZITHROMAX) 250 MG tablet TAKE 2 TABLETS BY MOUTH ON DAY, TAKE 1 TABLET ON DAYS 2 THROUGH 5. Patient not taking: Reported on 10/14/2020 05/06/20 05/06/21  Lorelee New, PA-C  carvedilol (COREG) 25 MG tablet TAKE 1 TABLET (25 MG TOTAL) BY MOUTH 2 (TWO) TIMES DAILY WITH A MEAL. 10/14/20 10/14/21  Geoffery Lyons, MD  furosemide (LASIX) 40 MG tablet Take 1 tablet (40 mg total) by mouth 2 (two) times daily. 12/17/20   Nira Conn, MD  lisinopril (ZESTRIL) 40 MG tablet TAKE 1 TABLET (40 MG TOTAL) BY MOUTH DAILY. 12/17/20 12/17/21  Nira Conn, MD  nitroGLYCERIN (NITROSTAT) 0.4 MG SL tablet Place 1 tablet (0.4 mg total) under the tongue every 5 (five) minutes x 3 doses as needed for chest pain. 08/10/20   Alessandra Bevels, MD  potassium chloride (KLOR-CON) 10 MEQ tablet Take 1 tablet (10 mEq total) by mouth 2 (two) times daily. 08/10/20   Alessandra Bevels, MD    Allergies    Ivp dye [iodinated diagnostic agents] and Tramadol  Review of Systems   Review of Systems  Constitutional:  Negative for fever.  Respiratory:  Positive for cough, chest tightness and shortness of breath.   Cardiovascular:  Positive for leg swelling. Negative for chest pain.  Gastrointestinal:  Negative for abdominal pain, nausea and vomiting.  All other systems reviewed and are negative.  Physical Exam Updated Vital Signs BP (!) 140/102   Pulse (!) 101   Temp 98.3 F (36.8 C) (Oral)   Resp 12   Ht 1.829 m (6')   SpO2 92%   BMI 28.40 kg/m   Physical Exam Vitals and nursing note reviewed.  Constitutional:      Appearance: He is well-developed. He is ill-appearing. He is not toxic-appearing.  HENT:     Head: Normocephalic and atraumatic.  Eyes:     Pupils: Pupils are equal, round, and reactive to light.   Cardiovascular:     Rate and Rhythm: Normal rate and regular rhythm.     Heart sounds: Normal heart sounds. No murmur heard. Pulmonary:     Effort: Tachypnea present. No respiratory distress.     Breath sounds: Wheezing present.     Comments: Tight, poor air movement, expiratory wheezing noted, nasal cannula in place Abdominal:     General: Bowel sounds are normal.     Palpations: Abdomen is soft.     Tenderness: There is no abdominal tenderness. There is no rebound.  Musculoskeletal:     Cervical back: Neck  supple.     Right lower leg: Edema present.     Left lower leg: Edema present.     Comments: Trace bilateral lower extremity edema  Lymphadenopathy:     Cervical: No cervical adenopathy.  Skin:    General: Skin is warm and dry.  Neurological:     Mental Status: He is alert and oriented to person, place, and time.  Psychiatric:        Mood and Affect: Mood normal.    ED Results / Procedures / Treatments   Labs (all labs ordered are listed, but only abnormal results are displayed) Labs Reviewed  BRAIN NATRIURETIC PEPTIDE - Abnormal; Notable for the following components:      Result Value   B Natriuretic Peptide 337.5 (*)    All other components within normal limits  TROPONIN I (HIGH SENSITIVITY) - Abnormal; Notable for the following components:   Troponin I (High Sensitivity) 28 (*)    All other components within normal limits  TROPONIN I (HIGH SENSITIVITY) - Abnormal; Notable for the following components:   Troponin I (High Sensitivity) 26 (*)    All other components within normal limits  RESP PANEL BY RT-PCR (FLU A&B, COVID) [id number]  BASIC METABOLIC PANEL  CBC    EKG EKG Interpretation  Date/Time:  Tuesday December 30 2020 21:18:10 EDT Ventricular Rate:  105 PR Interval:  128 QRS Duration: 82 QT Interval:  344 QTC Calculation: 454 R Axis:   82 Text Interpretation: Sinus tachycardia Right atrial enlargement Nonspecific ST and T wave abnormality Abnormal ECG  Confirmed by Ross Marcus ([id number]) on 12/31/2020 3:33:31 AM  Radiology DG Chest Port 1 View  Result Date: 12/30/2020 CLINICAL DATA:  Cough, shortness of breath, fever EXAM: PORTABLE CHEST 1 VIEW COMPARISON:  Chest x-ray 12/16/2020, CT chest 11/04/2018 FINDINGS: The heart and mediastinal contours are within normal limits. No focal consolidation. No pulmonary edema. No pleural effusion. No pneumothorax. No acute osseous abnormality. IMPRESSION: No active disease. Electronically Signed   By: Tish Frederickson M.D.   On: 12/30/2020 21:54    Procedures .Critical Care Performed by: Shon Baton, MD Authorized by: Shon Baton, MD   Critical care provider statement:    Critical care time (minutes):  45   Critical care was necessary to treat or prevent imminent or life-threatening deterioration of the following conditions:  Respiratory failure   Critical care was time spent personally by me on the following activities:  Discussions with consultants, evaluation of patient's response to treatment, examination of patient, ordering and performing treatments and interventions, ordering and review of laboratory studies, ordering and review of radiographic studies, pulse oximetry, re-evaluation of patient's condition, obtaining history from patient or surrogate and review of old charts   Medications Ordered in ED Medications  albuterol (VENTOLIN HFA) 108 (90 Base) MCG/ACT inhaler 4 puff (has no administration in time range)  albuterol (PROVENTIL) (2.5 MG/3ML) 0.083% nebulizer solution (15 mg/hr Nebulization Given 12/31/20 0517)  ipratropium (ATROVENT) nebulizer solution 0.5 mg (0.5 mg Nebulization Given 12/31/20 0356)  dexamethasone (DECADRON) injection 10 mg (10 mg Intravenous Given 12/31/20 0357)  magnesium sulfate IVPB 2 g 50 mL (0 g Intravenous Stopped 12/31/20 0512)  furosemide (LASIX) injection 40 mg (40 mg Intravenous Given 12/31/20 0359)  nitroGLYCERIN (NITROGLYN) 2 % ointment 1 inch (1 inch  Topical Given 12/31/20 0400)    ED Course  I have reviewed the triage vital signs and the nursing notes.  Pertinent labs & imaging results that were available during my  care of the patient were reviewed by me and considered in my medical decision making (see chart for details).  Clinical Course as of 12/31/20 0718  Wed Dec 31, 2020  6948 Went to check on patient.  DuoNeb finished.  Patient satting 84% asleep.  I had him sit up and take some deep breaths.  He still has expiratory wheezing but is moving air somewhat better.  O2 sats settled into the low 90s.  He is not on oxygen at home.  Discussed with him that I felt he likely needed admission.  Patient would like to avoid admission if at all possible.  He states he does not want to compromise his job.  I discussed with him that given his oxygen requirement, this was not likely; however, will repeat a DuoNeb and give him a little more time with treatment. [CH]  [phone] Patient is refusing further work-up or treatment.  He reports that he has to get home to keep his job.  He is still intermittently hypoxic although he is awake, alert, and appears to have decision-making capacity.  I discussed with him that if he leaves he may worsen and/or die.  He understands these risks.  I did offer him a steroid taper and an inhaler in an attempt to help him manage his symptoms. [CH]    Clinical Course User Index [CH] Veona Bittman, Mayer Masker, MD   MDM Rules/Calculators/A&P                            Patient presents with shortness of breath.  Very tight on initial evaluation.  History of COPD with also history of CHF.  He does not appear overtly volume overloaded.  EKG without acute ischemic changes.  Highly suspect COPD although a will cover for CHF as well.  Chest x-ray does not show overt pulmonary edema.  Labs are reassuring.  He was given 40 mg of IV Lasix as well as IV magnesium and a 1 hour-long DuoNeb.  See clinical course above.  He is still requiring some  oxygen.  His goal would be to try to get home.  I discussed with him that I did not feel this was likely; however, will order a second DuoNeb and reassess.  He has no evidence of pneumonia.  COVID testing is negative.  Final Clinical Impression(s) / ED Diagnoses Final diagnoses:  COPD exacerbation (HCC)  Hypoxia    Rx / DC Orders ED Discharge Orders          Ordered    predniSONE (STERAPRED UNI-PAK 21 TAB) 10 MG (21) TBPK tablet  Daily        12/31/20 0717             Shon Baton, MD 12/31/20 7035    Shon Baton, MD 12/31/20 [phone]

## 2020-12-31 NOTE — ED Notes (Signed)
Oxygen tank replaced °

## 2021-01-15 ENCOUNTER — Emergency Department (HOSPITAL_COMMUNITY): Payer: Self-pay

## 2021-01-15 ENCOUNTER — Other Ambulatory Visit: Payer: Self-pay

## 2021-01-15 ENCOUNTER — Encounter (HOSPITAL_COMMUNITY): Payer: Self-pay | Admitting: Emergency Medicine

## 2021-01-15 ENCOUNTER — Inpatient Hospital Stay (HOSPITAL_COMMUNITY)
Admission: EM | Admit: 2021-01-15 | Discharge: 2021-01-16 | DRG: 871 | Discharge status: Against Medical Advice | Payer: Self-pay | Attending: Internal Medicine | Admitting: Internal Medicine

## 2021-01-15 DIAGNOSIS — J440 Chronic obstructive pulmonary disease with acute lower respiratory infection: Secondary | ICD-10-CM | POA: Diagnosis present

## 2021-01-15 DIAGNOSIS — Z91041 Radiographic dye allergy status: Secondary | ICD-10-CM

## 2021-01-15 DIAGNOSIS — I5022 Chronic systolic (congestive) heart failure: Secondary | ICD-10-CM | POA: Diagnosis present

## 2021-01-15 DIAGNOSIS — J441 Chronic obstructive pulmonary disease with (acute) exacerbation: Secondary | ICD-10-CM | POA: Diagnosis present

## 2021-01-15 DIAGNOSIS — I110 Hypertensive heart disease with heart failure: Secondary | ICD-10-CM | POA: Diagnosis present

## 2021-01-15 DIAGNOSIS — Z9119 Patient's noncompliance with other medical treatment and regimen: Secondary | ICD-10-CM

## 2021-01-15 DIAGNOSIS — Z716 Tobacco abuse counseling: Secondary | ICD-10-CM

## 2021-01-15 DIAGNOSIS — R6520 Severe sepsis without septic shock: Secondary | ICD-10-CM | POA: Diagnosis present

## 2021-01-15 DIAGNOSIS — J181 Lobar pneumonia, unspecified organism: Secondary | ICD-10-CM | POA: Diagnosis present

## 2021-01-15 DIAGNOSIS — Z720 Tobacco use: Secondary | ICD-10-CM | POA: Diagnosis present

## 2021-01-15 DIAGNOSIS — B1920 Unspecified viral hepatitis C without hepatic coma: Secondary | ICD-10-CM | POA: Diagnosis present

## 2021-01-15 DIAGNOSIS — I252 Old myocardial infarction: Secondary | ICD-10-CM

## 2021-01-15 DIAGNOSIS — I5043 Acute on chronic combined systolic (congestive) and diastolic (congestive) heart failure: Secondary | ICD-10-CM | POA: Diagnosis present

## 2021-01-15 DIAGNOSIS — R7989 Other specified abnormal findings of blood chemistry: Secondary | ICD-10-CM | POA: Diagnosis present

## 2021-01-15 DIAGNOSIS — R0602 Shortness of breath: Secondary | ICD-10-CM

## 2021-01-15 DIAGNOSIS — R778 Other specified abnormalities of plasma proteins: Secondary | ICD-10-CM | POA: Diagnosis present

## 2021-01-15 DIAGNOSIS — F17210 Nicotine dependence, cigarettes, uncomplicated: Secondary | ICD-10-CM | POA: Diagnosis present

## 2021-01-15 DIAGNOSIS — Z79899 Other long term (current) drug therapy: Secondary | ICD-10-CM

## 2021-01-15 DIAGNOSIS — A419 Sepsis, unspecified organism: Principal | ICD-10-CM | POA: Diagnosis present

## 2021-01-15 DIAGNOSIS — Z7151 Drug abuse counseling and surveillance of drug abuser: Secondary | ICD-10-CM

## 2021-01-15 DIAGNOSIS — I420 Dilated cardiomyopathy: Secondary | ICD-10-CM | POA: Diagnosis present

## 2021-01-15 DIAGNOSIS — I2510 Atherosclerotic heart disease of native coronary artery without angina pectoris: Secondary | ICD-10-CM | POA: Diagnosis present

## 2021-01-15 DIAGNOSIS — J189 Pneumonia, unspecified organism: Secondary | ICD-10-CM | POA: Diagnosis present

## 2021-01-15 DIAGNOSIS — Z20822 Contact with and (suspected) exposure to covid-19: Secondary | ICD-10-CM | POA: Diagnosis present

## 2021-01-15 LAB — CBC WITH DIFFERENTIAL/PLATELET
Abs Immature Granulocytes: 0.25 10*3/uL — ABNORMAL HIGH (ref 0.00–0.07)
Basophils Absolute: 0.1 10*3/uL (ref 0.0–0.1)
Basophils Relative: 1 %
Eosinophils Absolute: 0.1 10*3/uL (ref 0.0–0.5)
Eosinophils Relative: 1 %
HCT: 47.5 % (ref 39.0–52.0)
Hemoglobin: 15.5 g/dL (ref 13.0–17.0)
Immature Granulocytes: 2 %
Lymphocytes Relative: 18 %
Lymphs Abs: 2.7 10*3/uL (ref 0.7–4.0)
MCH: 31.8 pg (ref 26.0–34.0)
MCHC: 32.6 g/dL (ref 30.0–36.0)
MCV: 97.5 fL (ref 80.0–100.0)
Monocytes Absolute: 1.1 10*3/uL — ABNORMAL HIGH (ref 0.1–1.0)
Monocytes Relative: 7 %
Neutro Abs: 10.7 10*3/uL — ABNORMAL HIGH (ref 1.7–7.7)
Neutrophils Relative %: 71 %
Platelets: 316 10*3/uL (ref 150–400)
RBC: 4.87 MIL/uL (ref 4.22–5.81)
RDW: 13.2 % (ref 11.5–15.5)
WBC: 15 10*3/uL — ABNORMAL HIGH (ref 4.0–10.5)
nRBC: 0 % (ref 0.0–0.2)

## 2021-01-15 LAB — BASIC METABOLIC PANEL
Anion gap: 12 (ref 5–15)
BUN: 11 mg/dL (ref 6–20)
CO2: 24 mmol/L (ref 22–32)
Calcium: 9.1 mg/dL (ref 8.9–10.3)
Chloride: 99 mmol/L (ref 98–111)
Creatinine, Ser: 1.02 mg/dL (ref 0.61–1.24)
GFR, Estimated: >60 mL/min (ref >60)
Glucose, Bld: 123 mg/dL — ABNORMAL HIGH (ref 70–99)
Potassium: 3.8 mmol/L (ref 3.5–5.1)
Sodium: 135 mmol/L (ref 135–145)

## 2021-01-15 LAB — D-DIMER, QUANTITATIVE: D-Dimer, Quant: 0.41 ug/mL-FEU (ref 0.00–0.50)

## 2021-01-15 LAB — TROPONIN I (HIGH SENSITIVITY)
Troponin I (High Sensitivity): 272 ng/L (ref <18)
Troponin I (High Sensitivity): 256 ng/L (ref <18)

## 2021-01-15 LAB — BRAIN NATRIURETIC PEPTIDE: B Natriuretic Peptide: 457.9 pg/mL — ABNORMAL HIGH (ref 0.0–100.0)

## 2021-01-15 LAB — RESP PANEL BY RT-PCR (FLU A&B, COVID) ARPGX2
Influenza A by PCR: NEGATIVE
Influenza B by PCR: NEGATIVE
SARS Coronavirus 2 by RT PCR: NEGATIVE

## 2021-01-15 LAB — MAGNESIUM: Magnesium: 1.6 mg/dL — ABNORMAL LOW (ref 1.7–2.4)

## 2021-01-15 LAB — LACTIC ACID, PLASMA: Lactic Acid, Venous: 0.9 mmol/L (ref 0.5–1.9)

## 2021-01-15 LAB — PROCALCITONIN: Procalcitonin: <0.1 ng/mL

## 2021-01-15 MED ORDER — ACETAMINOPHEN 325 MG PO TABS: 650.0000 mg | ORAL_TABLET | Freq: Four times a day (QID) | ORAL | Status: DC | PRN

## 2021-01-15 MED ORDER — IPRATROPIUM-ALBUTEROL 0.5-2.5 (3) MG/3ML IN SOLN
RESPIRATORY_TRACT | Status: AC
  Filled 2021-01-15: qty 6

## 2021-01-15 MED ORDER — IPRATROPIUM-ALBUTEROL 0.5-2.5 (3) MG/3ML IN SOLN
9.0000 mL | Freq: Once | RESPIRATORY_TRACT | Status: AC
  Filled 2021-01-15: qty 3

## 2021-01-15 MED ORDER — LACTATED RINGERS IV SOLN: INTRAVENOUS | Status: DC

## 2021-01-15 MED ORDER — DOXYCYCLINE HYCLATE 100 MG PO TABS
100.0000 mg | ORAL_TABLET | Freq: Two times a day (BID) | ORAL | Status: DC
  Administered 2021-01-15 – 2021-01-16 (×2): 100 mg via ORAL
  Filled 2021-01-15 (×3): qty 1

## 2021-01-15 MED ORDER — IPRATROPIUM-ALBUTEROL 0.5-2.5 (3) MG/3ML IN SOLN
RESPIRATORY_TRACT | Status: AC
  Administered 2021-01-15: 9 mL via RESPIRATORY_TRACT
  Filled 2021-01-15: qty 3

## 2021-01-15 MED ORDER — LISINOPRIL 20 MG PO TABS
40.0000 mg | ORAL_TABLET | Freq: Every day | ORAL | Status: DC
  Administered 2021-01-16: 40 mg via ORAL
  Filled 2021-01-15: qty 2

## 2021-01-15 MED ORDER — LEVALBUTEROL HCL 1.25 MG/0.5ML IN NEBU
1.2500 mg | INHALATION_SOLUTION | Freq: Four times a day (QID) | RESPIRATORY_TRACT | Status: DC
  Administered 2021-01-16 (×2): 1.25 mg via RESPIRATORY_TRACT
  Filled 2021-01-15 (×2): qty 0.5

## 2021-01-15 MED ORDER — ALBUTEROL SULFATE (2.5 MG/3ML) 0.083% IN NEBU
2.5000 mg | INHALATION_SOLUTION | RESPIRATORY_TRACT | Status: DC | PRN
Start: 2021-01-15 — End: 2021-01-15

## 2021-01-15 MED ORDER — POTASSIUM CHLORIDE CRYS ER 20 MEQ PO TBCR
20.0000 meq | EXTENDED_RELEASE_TABLET | Freq: Once | ORAL | Status: AC
  Administered 2021-01-15: 20 meq via ORAL
  Filled 2021-01-15: qty 1

## 2021-01-15 MED ORDER — ACETAMINOPHEN 500 MG PO TABS
1000.0000 mg | ORAL_TABLET | Freq: Once | ORAL | Status: AC
  Administered 2021-01-15: 1000 mg via ORAL
  Filled 2021-01-15: qty 2

## 2021-01-15 MED ORDER — ACETAMINOPHEN 650 MG RE SUPP: 650.0000 mg | Freq: Four times a day (QID) | RECTAL | Status: DC | PRN

## 2021-01-15 MED ORDER — ASPIRIN EC 81 MG PO TBEC
81.0000 mg | DELAYED_RELEASE_TABLET | Freq: Every day | ORAL | Status: DC
  Administered 2021-01-16: 81 mg via ORAL
  Filled 2021-01-15: qty 1

## 2021-01-15 MED ORDER — FUROSEMIDE 10 MG/ML IJ SOLN
40.0000 mg | Freq: Two times a day (BID) | INTRAMUSCULAR | Status: DC
  Administered 2021-01-16: 40 mg via INTRAVENOUS
  Filled 2021-01-15: qty 4

## 2021-01-15 MED ORDER — METHYLPREDNISOLONE SODIUM SUCC 125 MG IJ SOLR
125.0000 mg | Freq: Once | INTRAMUSCULAR | Status: AC
  Administered 2021-01-15: 125 mg via INTRAVENOUS
  Filled 2021-01-15: qty 2

## 2021-01-15 MED ORDER — LEVALBUTEROL HCL 1.25 MG/0.5ML IN NEBU: 1.2500 mg | INHALATION_SOLUTION | RESPIRATORY_TRACT | Status: DC | PRN

## 2021-01-15 MED ORDER — ASPIRIN 325 MG PO TABS
325.0000 mg | ORAL_TABLET | Freq: Once | ORAL | Status: AC
  Administered 2021-01-15: 325 mg via ORAL
  Filled 2021-01-15: qty 1

## 2021-01-15 MED ORDER — IPRATROPIUM BROMIDE 0.02 % IN SOLN
0.5000 mg | Freq: Four times a day (QID) | RESPIRATORY_TRACT | Status: DC
  Administered 2021-01-16 (×2): 0.5 mg via RESPIRATORY_TRACT
  Filled 2021-01-15 (×2): qty 2.5

## 2021-01-15 MED ORDER — LACTATED RINGERS IV BOLUS
500.0000 mL | Freq: Once | INTRAVENOUS | Status: AC
  Administered 2021-01-15: 500 mL via INTRAVENOUS

## 2021-01-15 MED ORDER — HYDRALAZINE HCL 20 MG/ML IJ SOLN
10.0000 mg | INTRAMUSCULAR | Status: DC | PRN
  Administered 2021-01-15 – 2021-01-16 (×2): 10 mg via INTRAVENOUS
  Filled 2021-01-15 (×2): qty 1

## 2021-01-15 MED ORDER — SODIUM CHLORIDE 0.9 % IV SOLN: 1.0000 g | INTRAVENOUS | Status: DC

## 2021-01-15 MED ORDER — SODIUM CHLORIDE 0.9 % IV SOLN
1.0000 g | Freq: Once | INTRAVENOUS | Status: AC
  Administered 2021-01-15: 1 g via INTRAVENOUS
  Filled 2021-01-15: qty 10

## 2021-01-15 MED ORDER — MAGNESIUM SULFATE 2 GM/50ML IV SOLN
2.0000 g | Freq: Once | INTRAVENOUS | Status: AC
  Administered 2021-01-15: 2 g via INTRAVENOUS
  Filled 2021-01-15: qty 50

## 2021-01-15 MED ORDER — IPRATROPIUM-ALBUTEROL 0.5-2.5 (3) MG/3ML IN SOLN: 3.0000 mL | Freq: Four times a day (QID) | RESPIRATORY_TRACT | Status: DC

## 2021-01-15 MED ORDER — METHYLPREDNISOLONE SODIUM SUCC 40 MG IJ SOLR
40.0000 mg | Freq: Four times a day (QID) | INTRAMUSCULAR | Status: DC
  Administered 2021-01-16: 40 mg via INTRAVENOUS
  Filled 2021-01-15: qty 1

## 2021-01-15 NOTE — Progress Notes (Signed)
Elink following for code sepsis 

## 2021-01-15 NOTE — ED Provider Notes (Signed)
[facility] EMERGENCY DEPARTMENT Provider Note   CSN: [id number] Arrival date & time: 01/15/21  1208     History Chief Complaint  Patient presents with   Shortness of Breath    Francisco [name] is a 60 y.o. male.  HPI This is a 60 year old male with history of CHF on Lasix, COPD not on home oxygen, CAD status post stenting, hypertension, and prior IV drug use who presents with shortness of breath and chest pain.  Patient reports being seen in this ED about 2 weeks ago, was diagnosed with COPD exacerbation and discharged on a Z-Pak.  Patient reports completing the course of antibiotics and having a couple days of symptom improvement but then symptoms progressively worsened.  He describes a cough productive of clear sputum, intermittent fevers at home, and chest pain.  Chest pain described as left-sided, pressure, nonradiating, 5 out of 10 in severity, not associated with nausea, vomiting, or diaphoresis.      Past Medical History:  Diagnosis Date   CHF (congestive heart failure) (HCC)    COPD (chronic obstructive pulmonary disease) (HCC)    Coronary artery disease    a. s/p prior PCI.   Hepatitis-C    Hypertension    IV drug abuse (HCC)    a. 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   Medically noncompliant    MI (myocardial infarction) (HCC)    Tobacco abuse     Patient Active Problem List   Diagnosis Date Noted   CAP (community acquired pneumonia) 01/15/2021   Polysubstance abuse (HCC) 08/10/2020   Acute on chronic heart failure with preserved ejection fraction (HFpEF) (HCC) 08/09/2020   NSTEMI (non-ST elevated myocardial infarction) (HCC) 08/09/2020   Prolonged QT interval 08/09/2020   CHF, acute on chronic (HCC) 08/08/2020   SOB (shortness of breath)    Hypertensive urgency    Acute on chronic respiratory failure with hypoxia and hypercapnia (HCC) 01/10/2020   COPD exacerbation (HCC) 01/09/2020   Hypotension 08/18/2019   Acute on chronic  combined systolic and diastolic CHF (congestive heart failure) (HCC) 08/17/2019   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   Chronic obstructive pulmonary disease (HCC)    Troponin I above reference range    Unstable angina (HCC)    Dilated cardiomyopathy (HCC)    CAD S/P percutaneous coronary angioplasty    ACS (acute coronary syndrome) (HCC) 04/29/2019   Acute respiratory failure with hypoxia (HCC) 06/17/2018    Past Surgical History:  Procedure Laterality Date   BACK SURGERY     NECK SURGERY     RIGHT/LEFT HEART CATH AND CORONARY ANGIOGRAPHY N/A 04/30/2019   Procedure: RIGHT/LEFT HEART CATH AND CORONARY ANGIOGRAPHY;  Surgeon: Marykay Lex, MD;  Location: [facility] INVASIVE CV LAB;  Service: Cardiovascular;  Laterality: N/A;   stints         Family History  Problem Relation Age of Onset   Rheum arthritis Mother     Social History   Tobacco Use   Smoking status: Every Day    Packs/day: 0.75    Types: Cigarettes   Smokeless tobacco: Never  Vaping Use   Vaping Use: Never used  Substance Use Topics   Alcohol use: Never   Drug use: Yes    Types: Methamphetamines    Comment: heroin    Home Medications Prior to Admission medications   Medication Sig Start Date End Date Taking? Authorizing Provider  albuterol (PROVENTIL) (2.5 MG/3ML) 0.083% nebulizer solution Take 2.5 mg by nebulization every 4 (four) hours as needed  for wheezing or shortness of breath. 01/02/21  Yes [provider]  albuterol (VENTOLIN HFA) 108 (90 Base) MCG/ACT inhaler Inhale 2 puffs into the lungs every 4 (four) hours as needed for wheezing or shortness of breath. Patient taking differently: Inhale 2 puffs into the lungs See admin instructions. Inhale 2 puffs into the lungs every two to four hours as needed for wheezing or shortness of breath 10/14/20  Yes Delo, Riley Lam, MD  aspirin EC 81 MG tablet Take 1 tablet (81 mg total) by mouth daily. 01/04/20  Yes Tilden Fossa, MD  furosemide (LASIX) 40 MG  tablet Take 1 tablet (40 mg total) by mouth 2 (two) times daily. 12/17/20  Yes Cardama, Amadeo Garnet, MD  lisinopril (ZESTRIL) 40 MG tablet TAKE 1 TABLET (40 MG TOTAL) BY MOUTH DAILY. Patient taking differently: Take 40 mg by mouth in the morning. 12/17/20 12/17/21 Yes Cardama, Amadeo Garnet, MD  metoprolol succinate (TOPROL-XL) 25 MG 24 hr tablet Take 25 mg by mouth in the morning. 12/17/20  Yes [provider]  nitroGLYCERIN (NITROSTAT) 0.4 MG SL tablet Place 1 tablet (0.4 mg total) under the tongue every 5 (five) minutes x 3 doses as needed for chest pain. 08/10/20  Yes Alessandra Bevels, MD  atorvastatin (LIPITOR) 40 MG tablet Take 1 tablet (40 mg total) by mouth daily. Patient not taking: Reported on 01/15/2021 08/11/20   Alessandra Bevels, MD  atorvastatin (LIPITOR) 80 MG tablet TAKE 1 TABLET (80 MG TOTAL) BY MOUTH DAILY AT 6 PM. Patient not taking: Reported on 01/15/2021 05/06/20 05/06/21  Lorelee New, PA-C  atorvastatin (LIPITOR) 80 MG tablet TAKE 1 TABLET (80 MG TOTAL) BY MOUTH DAILY AT 6 PM. Patient not taking: Reported on 01/15/2021 01/17/20 01/16/21  Anders Simmonds, PA-C  azithromycin (ZITHROMAX) 250 MG tablet TAKE 2 TABLETS BY MOUTH ON DAY, TAKE 1 TABLET ON DAYS 2 THROUGH 5. Patient not taking: Reported on 01/15/2021 05/06/20 05/06/21  Lorelee New, PA-C  carvedilol (COREG) 25 MG tablet TAKE 1 TABLET (25 MG TOTAL) BY MOUTH 2 (TWO) TIMES DAILY WITH A MEAL. Patient not taking: Reported on 01/15/2021 10/14/20 10/14/21  Geoffery Lyons, MD  potassium chloride (KLOR-CON) 10 MEQ tablet Take 1 tablet (10 mEq total) by mouth 2 (two) times daily. Patient not taking: Reported on 01/15/2021 08/10/20   Alessandra Bevels, MD  predniSONE (STERAPRED UNI-PAK 21 TAB) 10 MG (21) TBPK tablet Take by mouth daily. Take 6 tabs by mouth daily  for 2 days, then 5 tabs for 2 days, then 4 tabs for 2 days, then 3 tabs for 2 days, 2 tabs for 2 days, then 1 tab by mouth daily for 2 days Patient not taking: No sig  reported 12/31/20   Horton, Mayer Masker, MD    Allergies    Ivp dye [iodinated diagnostic agents] and Tramadol  Review of Systems   Review of Systems  Constitutional:  Positive for fever. Negative for chills.  HENT:  Negative for ear pain and sore throat.   Eyes:  Negative for pain and visual disturbance.  Respiratory:  Positive for cough, chest tightness and shortness of breath.   Cardiovascular:  Positive for chest pain. Negative for palpitations and leg swelling.  Gastrointestinal:  Negative for abdominal pain and vomiting.  Genitourinary:  Negative for dysuria and hematuria.  Musculoskeletal:  Negative for arthralgias and back pain.  Skin:  Negative for color change and rash.  Neurological:  Negative for seizures and syncope.  All other systems reviewed and are negative.  Physical Exam Updated Vital Signs BP (!) 152/88   Pulse (!) 102   Temp 98.9 F (37.2 C) (Oral)   Resp 20   Ht 6' (1.829 m)   Wt 99.8 kg   SpO2 94%   BMI 29.84 kg/m   Physical Exam Vitals and nursing note reviewed.  Constitutional:      Appearance: He is well-developed.  HENT:     Head: Normocephalic and atraumatic.  Eyes:     Conjunctiva/sclera: Conjunctivae normal.  Cardiovascular:     Rate and Rhythm: Normal rate and regular rhythm.     Heart sounds: No murmur heard. Pulmonary:     Effort: Pulmonary effort is normal. No respiratory distress.     Breath sounds: Examination of the right-middle field reveals wheezing. Examination of the left-middle field reveals wheezing. Examination of the right-lower field reveals wheezing. Examination of the left-lower field reveals wheezing. Decreased breath sounds and wheezing present.  Abdominal:     Palpations: Abdomen is soft.     Tenderness: There is no abdominal tenderness.  Musculoskeletal:     Cervical back: Neck supple.     Right lower leg: No tenderness. No edema.     Left lower leg: No tenderness. No edema.  Skin:    General: Skin is warm and  dry.  Neurological:     Mental Status: He is alert.    ED Results / Procedures / Treatments   Labs (all labs ordered are listed, but only abnormal results are displayed) Labs Reviewed  CBC WITH DIFFERENTIAL/PLATELET - Abnormal; Notable for the following components:      Result Value   WBC 15.0 (*)    Neutro Abs 10.7 (*)    Monocytes Absolute 1.1 (*)    Abs Immature Granulocytes 0.25 (*)    All other components within normal limits  BASIC METABOLIC PANEL - Abnormal; Notable for the following components:   Glucose, Bld 123 (*)    All other components within normal limits  BRAIN NATRIURETIC PEPTIDE - Abnormal; Notable for the following components:   B Natriuretic Peptide 457.9 (*)    All other components within normal limits  MAGNESIUM - Abnormal; Notable for the following components:   Magnesium 1.6 (*)    All other components within normal limits  TROPONIN I (HIGH SENSITIVITY) - Abnormal; Notable for the following components:   Troponin I (High Sensitivity) 272 (*)    All other components within normal limits  TROPONIN I (HIGH SENSITIVITY) - Abnormal; Notable for the following components:   Troponin I (High Sensitivity) 256 (*)    All other components within normal limits  RESP PANEL BY RT-PCR (FLU A&B, COVID) ARPGX2  CULTURE, BLOOD (ROUTINE X 2)  CULTURE, BLOOD (ROUTINE X 2)  EXPECTORATED SPUTUM ASSESSMENT W GRAM STAIN, RFLX TO RESP C  LACTIC ACID, PLASMA  PROCALCITONIN  HIV ANTIBODY (ROUTINE TESTING W REFLEX)  PHOSPHORUS  MAGNESIUM  COMPREHENSIVE METABOLIC PANEL  CBC WITH DIFFERENTIAL/PLATELET  BLOOD GAS, VENOUS  RAPID URINE DRUG SCREEN, HOSP PERFORMED  URINALYSIS, ROUTINE W REFLEX MICROSCOPIC  D-DIMER, QUANTITATIVE  STREP PNEUMONIAE URINARY ANTIGEN    EKG EKG Interpretation  Date/Time:  Thursday January 15 2021 15:36:41 EDT Ventricular Rate:  112 PR Interval:  140 QRS Duration: 89 QT Interval:  356 QTC Calculation: 486 R Axis:   47 Text  Interpretation: Sinus tachycardia Right atrial enlargement Minimal ST depression, lateral leads Borderline prolonged QT interval No acute changes No significant change since last tracing Confirmed by Rhunette Croft, Ankit [phone]) on  01/15/2021 4:02:30 PM  Radiology DG Chest 2 View  Result Date: 01/15/2021 CLINICAL DATA:  Shortness of breath and cough for 1 week also with dizziness in a 60 year old male. EXAM: CHEST - 2 VIEW COMPARISON:  December 30, 2020. FINDINGS: Trachea is midline. Cardiomediastinal contours and hilar structures are normal. New opacity in the LEFT lower lobe, this is developed since the previous exam. No sign of pleural effusion. No visible pneumothorax. On limited assessment there is no acute skeletal process. IMPRESSION: New opacity in the LEFT lower lobe suspicious for developing pneumonia, suggest follow-up to ensure resolution given mildly nodular appearance. Electronically Signed   By: Donzetta Kohut M.D.   On: 01/15/2021 13:31    Procedures Procedures   Medications Ordered in ED Medications  doxycycline (VIBRA-TABS) tablet 100 mg (100 mg Oral Given 01/15/21 1711)  cefTRIAXone (ROCEPHIN) 1 g in sodium chloride 0.9 % 100 mL IVPB (has no administration in time range)  acetaminophen (TYLENOL) tablet 650 mg (has no administration in time range)    Or  acetaminophen (TYLENOL) suppository 650 mg (has no administration in time range)  methylPREDNISolone sodium succinate (SOLU-MEDROL) 40 mg/mL injection 40 mg (has no administration in time range)  hydrALAZINE (APRESOLINE) injection 10 mg (has no administration in time range)  ipratropium (ATROVENT) nebulizer solution 0.5 mg (has no administration in time range)  levalbuterol (XOPENEX) nebulizer solution 1.25 mg (has no administration in time range)  levalbuterol (XOPENEX) nebulizer solution 1.25 mg (has no administration in time range)  magnesium sulfate IVPB 2 g 50 mL (has no administration in time range)  furosemide (LASIX)  injection 40 mg (has no administration in time range)  aspirin tablet 325 mg (has no administration in time range)  aspirin EC tablet 81 mg (has no administration in time range)  lisinopril (ZESTRIL) tablet 40 mg (has no administration in time range)  potassium chloride SA (KLOR-CON) CR tablet 20 mEq (has no administration in time range)  cefTRIAXone (ROCEPHIN) 1 g in sodium chloride 0.9 % 100 mL IVPB (0 g Intravenous Stopped 01/15/21 1718)  ipratropium-albuterol (DUONEB) 0.5-2.5 (3) MG/3ML nebulizer solution 9 mL (0 mLs Nebulization Hold 01/15/21 1624)  lactated ringers bolus 500 mL (0 mLs Intravenous Stopped 01/15/21 1844)  methylPREDNISolone sodium succinate (SOLU-MEDROL) 125 mg/2 mL injection 125 mg (125 mg Intravenous Given 01/15/21 1708)  acetaminophen (TYLENOL) tablet 1,000 mg (1,000 mg Oral Given 01/15/21 1845)    ED Course  I have reviewed the triage vital signs and the nursing notes.  Pertinent labs & imaging results that were available during my care of the patient were reviewed by me and considered in my medical decision making (see chart for details).    MDM Rules/Calculators/A&P                          Patient is stable on arrival but with tachypnea and tachycardia to 115. Temp elevated at 100.1. Concerning for sepsis with likely pulmonary source given cough and increased work of breathing. CXR with new LL lobe consolidation consistent with pneumonia and WBC elevated at 15. Lactate and blood cultures ordered. Other items on the ddx include CHF exacerbation, ACS, pneumothorax. EKG without evidence of acute ishcemia, ST depressions and T wave inversions present on prior EKGs. BNP elevated to almost 500, but no pulmonary edema on CXR, no JVD, no significant pulmonary edema. Low suspicion for CHF exacerbation as primary cause for SOB and other presenting symptoms. Given ceftriaxone and oral doxycycline for CAP coverage.  Given IV steroids given likely COPD exacerbation component. Given 500  cc fluids, avoiding full 30cc/kg fluids resuscitation given hx of CHF and elevated BNP.   On reassessment, lactate normal at 0.9. HR improved to 102. Troponin elevated at 256, previously 256, will trend but likely 2/2 demand ischemia. No ongoing CP low suspicion for Type I NSTEMI. PE remains on ddx, however pneumonia is more likely in this clinical setting.   Patient admitted given failure of outpatient management and c/f sepsis for IV abx to f/u blood cultures.   Final Clinical Impression(s) / ED Diagnoses Final diagnoses:  COPD exacerbation (HCC)  Community acquired pneumonia of left lower lobe of lung  Shortness of breath    Rx / DC Orders ED Discharge Orders     None        Doran Clay, MD 01/15/21 2052    Derwood Kaplan, MD 01/16/21 1521

## 2021-01-15 NOTE — ED Triage Notes (Signed)
Pt endorses SOB for 3-4 days. Hx of COPD and CHF.

## 2021-01-15 NOTE — ED Notes (Signed)
Got patient into a gown on the monitor patient is resting with call bell in reach 

## 2021-01-15 NOTE — ED Notes (Signed)
This RN tried to start second line on pt - unsuccessful - MD Howerter made aware and IV team consult put in

## 2021-01-15 NOTE — ED Provider Notes (Signed)
Emergency Medicine Provider Triage Evaluation Note  Francisco [name] , a 60 y.o. male  was evaluated in triage.  Pt complains of shortness of breath and cough over the last 3 days.  Has a history of congestive heart failure and COPD.  Was seen here recently for a COPD exacerbation was given steroids.  Cough is productive with clear and dark sputum.  1 day of subjective fever.  Shortness of breath is worse with any exertion.  Has been constant worsening  Review of Systems  Positive:  Negative: Nausea, vomiting, diarrhea, abdominal pain, chest pain  Physical Exam  BP (!) 163/120 (BP Location: Right Arm)   Pulse (!) 119   Temp 100.1 F (37.8 C) (Oral)   Resp (!) 24   SpO2 95%  Gen:   Awake, mild distress Resp:  Increased respiratory effort.  Decreased breath sounds throughout.  Mild wheeze MSK:   Moves extremities without difficulty  Other:    Medical Decision Making  Medically screening exam initiated at 12:33 PM.  Appropriate orders placed.  Arsenio Schnorr was informed that the remainder of the evaluation will be completed by another provider, this initial triage assessment does not replace that evaluation, and the importance of remaining in the ED until their evaluation is complete.     Honor Loh Milner, PA-C 01/15/21 1235    Alvira Monday, MD 01/15/21 1255

## 2021-01-15 NOTE — ED Notes (Addendum)
Dr. Arlean Hopping at bedside. Okay with just one set of blood cultures at this time

## 2021-01-15 NOTE — ED Notes (Signed)
Unable to draw labs pt hard stick RN aware

## 2021-01-15 NOTE — Progress Notes (Signed)
Difficult lab stick

## 2021-01-15 NOTE — H&P (Signed)
History and Physical    PLEASE NOTE THAT DRAGON DICTATION SOFTWARE WAS USED IN THE CONSTRUCTION OF THIS NOTE.   Francisco [name] SAY:[id number] DOB: Apr 16, 1961 DOA: 01/15/2021  PCP: Patient, No Pcp Per (Inactive) Patient coming from: home   I have personally briefly reviewed patient's old medical records in [place]  Chief Complaint: Shortness of breath  HPI: Francisco [name] is a 60 y.o. male with medical history significant for chronic combined systolic/diastolic heart failure, COPD, hypertension, chronic tobacco abuse, who is admitted to [facility] on 01/15/2021 with severe sepsis due to left lower lobe pneumonia after presenting from home to [facility] ED complaining of shortness of breath.   The patient reports 1 week of progressive shortness of breath associate with new onset productive cough as well as subjective fever.  Over the last 2 to 3 days he is also noted new onset orthopnea, PND, and mild new onset edema in the bilateral lower extremities.  He denies any associated chest pain, diaphoresis, palpitations, nausea, vomiting, presyncope, or syncope.  He notes that his new onset cough has not been associate with any hemoptysis, and denies any recent new lower extremity erythema or calf tenderness.  No recent trauma, travel, surgical procedures, or periods of prolonged diminished amatory status.  No recent melena or hematochezia.  He conveys that his recent subjective fever is not associated with any chills, full body rigors, or generalized myalgias.  No recent headache, neck stiffness, sore throat, abdominal pain, diarrhea, or rash.  No recent known COVID-19 exposures.  He also denies any recent dysuria, gross hematuria, or change in urinary urgency/frequency.  He reports that he was experiencing some similar shortness of breath with mildly productive cough approximately 2 weeks ago when he presented to [facility] emergency department and was diagnosed with acute COPD exacerbation.   Hospitalization was reportedly recommended to him at that time, although he ultimately declined and left the ED AMA with a Z-Pak.  He notes that he compliantly completed the Z-Pak, and reported ensuing improvement in his shortness of breath and productive cough for several days until return of the symptoms as well as subjective fever 1 week ago.  Of note, he conveys that the shortness of breath and productive cough that he was experiencing 2 weeks ago was not associate with any orthopnea, PND, or peripheral edema.   The patient confirms a history of COPD, without any known baseline supplemental oxygen requirements.  He conveys that he has a current smoker, smoking at least 3/4 pack/day on an active basis over at least the last 20 years.  He conveys a history of IV drug abuse, including that of heroin and meth, and conveys to me that he most recently used IV meth yesterday.   Per chart review, most recent echocardiogram occurred in April 2022 and was associated with LVEF 35 to 40%, global left ventricular hypokinesis, severe LVH, grade 1 diastolic dysfunction, and no evidence of significant valvular pathology.  The patient acknowledges that he is prescribed Lasix 40 mg p.o. daily, but acknowledges that he typically misses 1-2 doses per week of his home diuretic as well as several of his other outpatient medications, and conveys some difficulty in affording these medications on an outpatient basis.    ED Course:  Vital signs in the ED were notable for the following: Temperature max 100.1, heart rate 110 and 119; blood pressure 154/107; respiratory rate 18-26, oxygen saturation 92 to 95% on room air.  Labs were notable for the following:  BMP notable for the following: Sodium 135, potassium 3.8, creatinine 1.02 relative to most recent prior value of 1.17 on 12/30/2020.  Serum magnesium level 1.6.  BNP 457 compared to 337 on 12/30/2020 and relative to 1300 in September 2021.  Initial high-sensitivity troponin I  found to be 272, with repeat value trending down to 256, which is relative to a value of 26 on 12/31/2020.  CBC notable for white blood cell count 15,000.  Lactic acid 0.9.  COVID-19/influenza PCR checked in the ED today were found to be negative.  Blood cultures x2 were collected prior to initiation of IV antibiotics.  Imaging and additional notable ED work-up: EKG in comparison to most recent prior from 12/31/2020 showed sinus tachycardia with heart rate 112, borderline QTC prolongation of 458m, no evidence of T wave changes, and less than 1 mm ST depression in V5/V6, which appears unchanged from most recent prior EKG.  Chest x-ray shows interval development of left lower lobe airspace opacity concerning for interval development of pneumonia, without evidence of edema, pleural effusion, or pneumothorax.  While in the ED, the following were administered: Rocephin, doxycycline, acetaminophen 1 g p.o. x1, duo nebulizer treatment x1, solumedrol 125 mg IV x1; additionally, he received a 500 cc LR bolus followed by initiation of continuous LR at 150 cc/h.     Review of Systems: As per HPI otherwise 10 point review of systems negative.   Past Medical History:  Diagnosis Date   CHF (congestive heart failure) (HCC)    COPD (chronic obstructive pulmonary disease) (HCC)    Coronary artery disease    a. s/p prior PCI.   Hepatitis-C    Hypertension    [place] drug [place]    a. 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   Medically noncompliant    MI ([place]ocardial infarction) ([place]    Tobacco abuse     Past Surgical History:  Procedure Laterality Date   BACK SURGERY     NECK SURGERY     RIGHT/LEFT HEART CATH AND CORONARY ANGIOGRAPHY N/A 04/30/2019   Procedure: RIGHT/LEFT HEART CATH AND CORONARY ANGIOGRAPHY;  Surgeon: HLeonie Man MD;  [facility];  Service: Cardiovascular;  Laterality: N/A;   stints      Social History:  reports that he has been smoking cigarettes. He  has been smoking an average of .75 packs per day. He has never used smokeless tobacco. He reports current drug use. Drug: Methamphetamines. He reports that he does not drink alcohol.   Allergies  Allergen Reactions   Ivp Dye [Iodinated Diagnostic Agents] Other (See Comments)    Seizures    Tramadol Other (See Comments)    Seizures     Family History  Problem Relation Age of Onset   Rheum arthritis Mother     Family history reviewed and not pertinent    Prior to Admission medications   Medication Sig Start Date End Date Taking? Authorizing Provider  albuterol (PROVENTIL) (2.5 MG/3ML) 0.083% nebulizer solution Take 2.5 mg by nebulization every 4 (four) hours as needed for wheezing or shortness of breath. 01/02/21  Yes [provider]  albuterol (VENTOLIN HFA) 108 (90 Base) MCG/ACT inhaler Inhale 2 puffs into the lungs every 4 (four) hours as needed for wheezing or shortness of breath. Patient taking differently: Inhale 2 puffs into the lungs See admin instructions. Inhale 2 puffs into the lungs every two to four hours as needed for wheezing or shortness of breath 10/14/20  Yes DVeryl Speak MD  aspirin EC 81 MG tablet Take 1 tablet (81 mg total) by mouth daily. 01/04/20  Yes Quintella Reichert, MD  furosemide (LASIX) 40 MG tablet Take 1 tablet (40 mg total) by mouth 2 (two) times daily. 12/17/20  Yes Cardama, Grayce Sessions, MD  lisinopril (ZESTRIL) 40 MG tablet TAKE 1 TABLET (40 MG TOTAL) BY MOUTH DAILY. Patient taking differently: Take 40 mg by mouth in the morning. 12/17/20 12/17/21 Yes Cardama, Grayce Sessions, MD  metoprolol succinate (TOPROL-XL) 25 MG 24 hr tablet Take 25 mg by mouth in the morning. 12/17/20  Yes [provider]  nitroGLYCERIN (NITROSTAT) 0.4 MG SL tablet Place 1 tablet (0.4 mg total) under the tongue every 5 (five) minutes x 3 doses as needed for chest pain. 08/10/20  Yes Guilford Shi, MD  atorvastatin (LIPITOR) 40 MG tablet Take 1 tablet (40 mg total)  by mouth daily. Patient not taking: Reported on 01/15/2021 08/11/20   Guilford Shi, MD  atorvastatin (LIPITOR) 80 MG tablet TAKE 1 TABLET (80 MG TOTAL) BY MOUTH DAILY AT 6 PM. Patient not taking: Reported on 01/15/2021 05/06/20 05/06/21  Corena Herter, PA-C  atorvastatin (LIPITOR) 80 MG tablet TAKE 1 TABLET (80 MG TOTAL) BY MOUTH DAILY AT 6 PM. Patient not taking: Reported on 01/15/2021 01/17/20 01/16/21  Argentina Donovan, PA-C  azithromycin (ZITHROMAX) 250 MG tablet TAKE 2 TABLETS BY MOUTH ON DAY, TAKE 1 TABLET ON DAYS 2 THROUGH 5. Patient not taking: Reported on 01/15/2021 05/06/20 05/06/21  Corena Herter, PA-C  carvedilol (COREG) 25 MG tablet TAKE 1 TABLET (25 MG TOTAL) BY MOUTH 2 (TWO) TIMES DAILY WITH A MEAL. Patient not taking: Reported on 01/15/2021 10/14/20 10/14/21  Veryl Speak, MD  potassium chloride (KLOR-CON) 10 MEQ tablet Take 1 tablet (10 mEq total) by mouth 2 (two) times daily. Patient not taking: Reported on 01/15/2021 08/10/20   Guilford Shi, MD  predniSONE (STERAPRED UNI-PAK 21 TAB) 10 MG (21) TBPK tablet Take by mouth daily. Take 6 tabs by mouth daily  for 2 days, then 5 tabs for 2 days, then 4 tabs for 2 days, then 3 tabs for 2 days, 2 tabs for 2 days, then 1 tab by mouth daily for 2 days Patient not taking: No sig reported 12/31/20   Merryl Hacker, MD     Objective    Physical Exam: Vitals:   01/15/21 1700 01/15/21 1730 01/15/21 1800 01/15/21 1844  BP: (!) 144/117 (!) 191/118 (!) 154/107   Pulse: (!) 115 (!) 111 (!) 110   Resp: 19 (!) 26 (!) 23   Temp:    98.3 F (36.8 C)  TempSrc:    Oral  SpO2: 93% 91% 91%   Weight:      Height:        General: appears to be stated age; alert, oriented; mildly increased work of breathing noted Skin: warm, dry, no rash Head:  AT/[place] Mouth:  Oral mucosa membranes appear moist, normal dentition Neck: supple; trachea midline Heart: Mildly tachycardic, but regular; did not appreciate any M/R/G Lungs: Bilateral  expiratory wheezes noted , but otherwise CTAB, did not appreciate any , rales, or rhonchi Abdomen: + BS; soft, ND, NT Vascular: 2+ pedal pulses b/l; 2+ radial pulses b/l Extremities: Trace to 1+ edema in the bilateral lower extremities , no muscle wasting Neuro: strength and sensation intact in upper and lower extremities b/l   Labs on Admission: I have personally reviewed following labs and imaging studies  CBC: Recent Labs  Lab 01/15/21  1236  WBC 15.0*  NEUTROABS 10.7*  HGB 15.5  HCT 47.5  MCV 97.5  PLT 440   Basic Metabolic Panel: Recent Labs  Lab 01/15/21 1236  NA 135  K 3.8  CL 99  CO2 24  GLUCOSE 123*  BUN 11  CREATININE 1.02  CALCIUM 9.1   GFR: Estimated Creatinine Clearance: 94.2 mL/min (by C-G formula based on SCr of 1.02 mg/dL). Liver Function Tests: No results for input(s): AST, ALT, ALKPHOS, BILITOT, PROT, ALBUMIN in the last 168 hours. No results for input(s): LIPASE, AMYLASE in the last 168 hours. No results for input(s): AMMONIA in the last 168 hours. Coagulation Profile: No results for input(s): INR, PROTIME in the last 168 hours. Cardiac Enzymes: No results for input(s): CKTOTAL, CKMB, CKMBINDEX, TROPONINI in the last 168 hours. BNP (last 3 results) No results for input(s): PROBNP in the last 8760 hours. HbA1C: No results for input(s): HGBA1C in the last 72 hours. CBG: No results for input(s): GLUCAP in the last 168 hours. Lipid Profile: No results for input(s): CHOL, HDL, LDLCALC, TRIG, CHOLHDL, LDLDIRECT in the last 72 hours. Thyroid Function Tests: No results for input(s): TSH, T4TOTAL, FREET4, T3FREE, THYROIDAB in the last 72 hours. Anemia Panel: No results for input(s): VITAMINB12, FOLATE, FERRITIN, TIBC, IRON, RETICCTPCT in the last 72 hours. Urine analysis:    Component Value Date/Time   COLORURINE AMBER (A) 08/18/2019 1320   APPEARANCEUR CLOUDY (A) 08/18/2019 1320   LABSPEC 1.017 08/18/2019 1320   PHURINE 5.0 08/18/2019 1320    GLUCOSEU NEGATIVE 08/18/2019 1320   HGBUR SMALL (A) 08/18/2019 1320   BILIRUBINUR NEGATIVE 08/18/2019 1320   KETONESUR NEGATIVE 08/18/2019 1320   PROTEINUR 100 (A) 08/18/2019 1320   NITRITE NEGATIVE 08/18/2019 1320   LEUKOCYTESUR NEGATIVE 08/18/2019 1320    Radiological Exams on Admission: DG Chest 2 View  Result Date: 01/15/2021 CLINICAL DATA:  Shortness of breath and cough for 1 week also with dizziness in a 60 year old male. EXAM: CHEST - 2 VIEW COMPARISON:  December 30, 2020. FINDINGS: Trachea is midline. Cardiomediastinal contours and hilar structures are normal. New opacity in the LEFT lower lobe, this is developed since the previous exam. No sign of pleural effusion. No visible pneumothorax. On limited assessment there is no acute skeletal process. IMPRESSION: New opacity in the LEFT lower lobe suspicious for developing pneumonia, suggest follow-up to ensure resolution given mildly nodular appearance. Electronically Signed   By: Zetta Bills M.D.   On: 01/15/2021 13:31     EKG: Independently reviewed, with result as described above.    Assessment/Plan   Francisco [name] is a 60 y.o. male with medical history significant for chronic combined systolic/diastolic heart failure, COPD, hypertension, chronic tobacco abuse, who is admitted to [facility] on 01/15/2021 with severe sepsis due to left lower lobe pneumonia after presenting from home to [facility] ED complaining of shortness of breath.    Principal Problem:   CAP (community acquired pneumonia) Active Problems:   Elevated troponin   Acute on chronic combined systolic and diastolic CHF (congestive heart failure) (HCC)   COPD with acute exacerbation (HCC)   Severe sepsis (HCC)   Hypomagnesemia   Tobacco abuse     #) Severe sepsis due to community-acquired pneumonia: Diagnosis on the basis of 1 week of progressive shortness of breath associate with new onset productive cough, subjective fever, with presenting chest x-ray  showing interval development of left lower lobe airspace opacity consistent with pneumonia.  SIRS criteria met via presenting leukocytosis, tachycardia,  and tachypnea. Will consider patient's sepsis to be severe in nature given clinical suspicion for mildly elevated troponin as a consequence of supply demand mismatch in the setting of patient's underlying sepsis.  Of note, presenting lactate nonelevated at 0.9.  No evidence of associated hypotension. In the absence of lactic acid level that is greater than or equal to 4.0, and in the absence of any associated hypotension, there are no indications for administration of a 30 mL/kg IVF bolus at this time.  Of note, COVID-19/importance of PCR checked in the ED today and found to be negative.  We will also check urinalysis to further evaluate for any concomitant underlying infectious sources.  We will closely monitor results of blood cultures x2 collected prior to initiation of Rocephin and doxycycline in the ED, particularly given the patient's report of IV methamphetamine use.  In terms of CAP coverage, will continue the Rocephin and doxycycline initiated in the ED today, given that the patient had recently completed a course of azithromycin as an outpatient, will noting the appropriateness of doxycycline in the setting of borderline QTC prolongation identified on today's EKG.  Plan: Follow-up for results of blood cultures x2 collected today.  Continue Rocephin and doxycycline, as above.  Check sputum culture.  Add on procalcitonin.  Flutter valve.  Check strep pneumoniae urine antigen.  Repeat CBC with differential in the morning.  Check urinalysis.  Repeat CBC with differential in the morning.       #) Acute COPD exacerbation: in the context of a documented history of COPD, diagnosis of acute exacerbation on the basis of 1 week of progressive shortness of breath, new onset cough, with evidence of increased work of breathing, with source of exacerbation  suspected to be severe sepsis due to left lower lobe pneumonia, as further detailed above this is in the context of the patient's acknowledgment of being a current smoker, as further detailed above.  There may be room for optimization of his outpatient respiratory regimen, including the addition of a LABA or LAMA, as his current respiratory regimen at home is limited to as needed albuterol.  In terms of short acting beta-2 agonist, will like to proceed with Xopenex in the context of presenting tachycardia.   Plan: monitor continuous pulse oxymetry. Monitor on telemetry. Solumedrol. Scheduled Xopenex/ipratropium nebulizer treatments as needed Xopenex nebulizer.  Further evaluation management of presenting severe sepsis due to pneumonia, including antibiotics, as above.  Check VBG.  Check serum phosphorus level.  We will attempt additional chart review to evaluate most recent PFT results.  Counseled the patient on the importance of complete smoking discontinuation. Check d-dimer.      #) Hypomagnesemia: Presenting serum mag found to be 1.6.  Plan: Magnesium sulfate 2 g IV every 2 hours x1 dose now.  Repeat serum magnesium level in the morning.  Monitor on telemetry.  Repeat CMP in the morning.       #) Acute on chronic systolic/diastolic heart failure: In the setting of a documented history of chronic systolic/diastolic heart failure, with most recent echocardiogram in April 2022, with results of such further detailed above, suspect an element of acute heart failure exacerbation given the patient's report of 2 to 3 days of orthopnea, PND, and associated development of mild edema involving bilateral lower extremities.  Given that these symptoms started only over the last 2 to 3 days relative to his overall shortness of breath with new onset productive cough and fever over the course the last week, suspect that  his acute on chronic heart failure is more as a consequence of physiologic stress stemming  from presenting sepsis due to pneumonia and acute COPD exacerbation as opposed to representing a primary event.  Additional contributing factors include the patient's acknowledgment of suboptimal compliance with his home Lasix and lisinopril.  He also notes unreliable use of Toprol-XL.  Consequently, in the absence of regular use of home beta-blocker, refrain from scheduling his home Toprol-XL for now given presenting acutely decompensated heart failure and increased risk for further exacerbation of such in the absence of routine outpatient beta-blocker use.   Plan: Lasix 40 mg IV twice daily.  Monitor strict I's and O's and daily weights.  Potassium chloride 20 mEq p.o. x1 dose now.  IV supplementation of serum magnesium level, as above.  Monitor on telemetry.  Monitor continuous pulse oximetry.  Resume home Lasix.  Holding home beta-blocker, as further detailed above.  Counseled patient on the importance of improved compliance with his outpatient medications.  To assist with any obstacles posed by affordability of these outpatient medications, have also consulted the transition of care team.  Echocardiogram ordered for the morning.      #) Elevated troponin: mildly elevated initial troponin of 272, with ensuing value trending down to 256 , which is relative to most recent prior value 26 on 12/31/2020.  Suspect that this mildly elevated troponin is on the basis of supply demand mismatch in the setting of severe sepsis due to pneumonia resulting in acute COPD exacerbation and ensuing development of acute on chronic systolic/diastolic heart failure as opposed to representing a type I process due to acute plaque rupture.  EKG shows no evidence of acute ischemic changes, including no evidence of STEMI, and CXR showed left lower lobe infiltrate concerning for pneumonia, but no additional evidence of other acute CP process, including no evidence of pneumothorax.  Additionally, presentation is not associated with  any CP.  Overall, ACS is felt to be less likely relative to type 2 supply demand mismatch, as above, but will closely monitor on telemetry overnight while treating suspected underlying, as further described above. Will also check d-dimer.    Plan: repeat troponin in the AM. Monitor on telemetry. PRN EKG for development of chest pain.  Supplementation serum potassium and magnesium levels, as above, with plan for repeat BMP and serum magnesium levels in the morning check serum Mg level and repeat BMP in the morning, with aditional prn supplementation to maintain Mg and potassium levels greater than or equal to 2.0 and 4.0, respectively, to further reduce risk of ventricular arrhythmia. Repeat CBC in the AM. Additional evaluation and management of presenting severe sepsis due to pneumonia, acute COPD exacerbation, and acute on chronic systolic/diastolic heart failure as suspected driving forces behind mildly elevated troponin, as above.  Check D-dimer.  Full dose aspirin x1 now.  Continue home lisinopril.  Echocardiogram ordered for the morning.     #) Chronic tobacco abuse: Patient acknowledges that he is a current smoker, having smoked 3/4 pack/day for at least the last 20 years.  Plan: Counseled the patient on the importance of complete smoking discontinuation, particularly in the setting of underlying COPD.      #) IV methamphetamine abuse: Patient acknowledges intermittent use of IV methamphetamine, noting most recent such use occurring yesterday, potentially contributing to his acute on chronic combined heart failure.  Given this recent use, will refrain from use of nonselective beta-blockers for now.  Plan: Counseled the patient on the importance of complete  discontinuation of IV recreational drug use.  Further evaluation management of acute on chronic combined heart failure, as above.  Echocardiogram in the morning.  Urinary drug screen ordered.     DVT prophylaxis: SCDs Code Status:  Full code Family Communication: none Disposition Plan: Per Rounding Team Consults called: none;  Admission status: Inpatient     Of note, this patient was added by me to the following Admit List/Treatment Team: [email or web address].    Of note, the Adult Admission Order Set (Multimorbid order set) was used by me in the admission process for this patient.   PLEASE NOTE THAT DRAGON DICT[employer] SOFTWARE WAS USED IN THE CONSTRUCTION OF THIS NOTE.   [facility] Pager [phone] From [place]  Otherwise, please contact night-coverage  [email or web address] Password [facility]   01/15/2021, 6:52 PM

## 2021-01-15 NOTE — Progress Notes (Signed)
Notified bedside nurse of need to draw blood cultures.  

## 2021-01-15 NOTE — Progress Notes (Signed)
Antibiotics given before blood cultures drawn 

## 2021-01-15 NOTE — ED Notes (Signed)
Pt woke up out of sleep feeling like he could not breathe - pt called out stating this - on arrival to room pt is sitting on edge of bed, extremely diaphoretic - pt reports no CP at this time - EKG and vitals obtained - MD Howerter paged and notified

## 2021-01-15 NOTE — ED Notes (Signed)
IV team at bedside 

## 2021-01-16 ENCOUNTER — Other Ambulatory Visit (HOSPITAL_COMMUNITY): Payer: Self-pay

## 2021-01-16 DIAGNOSIS — A419 Sepsis, unspecified organism: Secondary | ICD-10-CM | POA: Diagnosis present

## 2021-01-16 DIAGNOSIS — J441 Chronic obstructive pulmonary disease with (acute) exacerbation: Secondary | ICD-10-CM | POA: Diagnosis present

## 2021-01-16 DIAGNOSIS — F17210 Nicotine dependence, cigarettes, uncomplicated: Secondary | ICD-10-CM | POA: Diagnosis present

## 2021-01-16 DIAGNOSIS — Z720 Tobacco use: Secondary | ICD-10-CM | POA: Diagnosis present

## 2021-01-16 LAB — STREP PNEUMONIAE URINARY ANTIGEN: Strep Pneumo Urinary Antigen: NEGATIVE

## 2021-01-16 LAB — URINALYSIS, ROUTINE W REFLEX MICROSCOPIC
Bacteria, UA: NONE SEEN
Bilirubin Urine: NEGATIVE
Glucose, UA: >=500 mg/dL — AB
Hgb urine dipstick: NEGATIVE
Ketones, ur: NEGATIVE mg/dL
Leukocytes,Ua: NEGATIVE
Nitrite: NEGATIVE
Protein, ur: NEGATIVE mg/dL
Specific Gravity, Urine: 1.017 (ref 1.005–1.030)
pH: 5 (ref 5.0–8.0)

## 2021-01-16 LAB — CBC WITH DIFFERENTIAL/PLATELET
Abs Immature Granulocytes: 0.11 10*3/uL — ABNORMAL HIGH (ref 0.00–0.07)
Basophils Absolute: 0 10*3/uL (ref 0.0–0.1)
Basophils Relative: 0 %
Eosinophils Absolute: 0 10*3/uL (ref 0.0–0.5)
Eosinophils Relative: 0 %
HCT: 51.7 % (ref 39.0–52.0)
Hemoglobin: 17.3 g/dL — ABNORMAL HIGH (ref 13.0–17.0)
Immature Granulocytes: 1 %
Lymphocytes Relative: 7 %
Lymphs Abs: 0.8 10*3/uL (ref 0.7–4.0)
MCH: 32.1 pg (ref 26.0–34.0)
MCHC: 33.5 g/dL (ref 30.0–36.0)
MCV: 95.9 fL (ref 80.0–100.0)
Monocytes Absolute: 0.1 10*3/uL (ref 0.1–1.0)
Monocytes Relative: 1 %
Neutro Abs: 10 10*3/uL — ABNORMAL HIGH (ref 1.7–7.7)
Neutrophils Relative %: 91 %
Platelets: 305 10*3/uL (ref 150–400)
RBC: 5.39 MIL/uL (ref 4.22–5.81)
RDW: 13.2 % (ref 11.5–15.5)
WBC: 11 10*3/uL — ABNORMAL HIGH (ref 4.0–10.5)
nRBC: 0 % (ref 0.0–0.2)

## 2021-01-16 LAB — COMPREHENSIVE METABOLIC PANEL
ALT: 44 U/L (ref 0–44)
AST: 30 U/L (ref 15–41)
Albumin: 3.2 g/dL — ABNORMAL LOW (ref 3.5–5.0)
Alkaline Phosphatase: 62 U/L (ref 38–126)
Anion gap: 12 (ref 5–15)
BUN: 16 mg/dL (ref 6–20)
CO2: 25 mmol/L (ref 22–32)
Calcium: 9.3 mg/dL (ref 8.9–10.3)
Chloride: 101 mmol/L (ref 98–111)
Creatinine, Ser: 0.94 mg/dL (ref 0.61–1.24)
GFR, Estimated: >60 mL/min (ref >60)
Glucose, Bld: 130 mg/dL — ABNORMAL HIGH (ref 70–99)
Potassium: 4.6 mmol/L (ref 3.5–5.1)
Sodium: 138 mmol/L (ref 135–145)
Total Bilirubin: 1 mg/dL (ref 0.3–1.2)
Total Protein: 6.9 g/dL (ref 6.5–8.1)

## 2021-01-16 LAB — PHOSPHORUS: Phosphorus: 3 mg/dL (ref 2.5–4.6)

## 2021-01-16 LAB — RAPID URINE DRUG SCREEN, HOSP PERFORMED
Amphetamines: POSITIVE — AB
Barbiturates: NOT DETECTED
Benzodiazepines: NOT DETECTED
Cocaine: NOT DETECTED
Opiates: NOT DETECTED
Tetrahydrocannabinol: POSITIVE — AB

## 2021-01-16 LAB — MAGNESIUM: Magnesium: 2.5 mg/dL — ABNORMAL HIGH (ref 1.7–2.4)

## 2021-01-16 LAB — TROPONIN I (HIGH SENSITIVITY): Troponin I (High Sensitivity): 123 ng/L (ref <18)

## 2021-01-16 LAB — HIV ANTIBODY (ROUTINE TESTING W REFLEX): HIV Screen 4th Generation wRfx: NONREACTIVE

## 2021-01-16 MED ORDER — ALBUTEROL SULFATE HFA 108 (90 BASE) MCG/ACT IN AERS
1.0000 | INHALATION_SPRAY | Freq: Once | RESPIRATORY_TRACT | Status: AC
  Administered 2021-01-16: 1 via RESPIRATORY_TRACT

## 2021-01-16 MED ORDER — ALBUTEROL SULFATE (2.5 MG/3ML) 0.083% IN NEBU: 2.5000 mg | INHALATION_SOLUTION | RESPIRATORY_TRACT | Status: DC | PRN

## 2021-01-16 MED ORDER — FUROSEMIDE 20 MG PO TABS: 40.0000 mg | ORAL_TABLET | Freq: Two times a day (BID) | ORAL | Status: DC

## 2021-01-16 MED ORDER — METHYLPREDNISOLONE SODIUM SUCC 125 MG IJ SOLR: 60.0000 mg | Freq: Two times a day (BID) | INTRAMUSCULAR | Status: DC

## 2021-01-16 MED ORDER — METHYLPREDNISOLONE SODIUM SUCC 125 MG IJ SOLR
80.0000 mg | Freq: Two times a day (BID) | INTRAMUSCULAR | Status: DC
  Administered 2021-01-16: 80 mg via INTRAVENOUS
  Filled 2021-01-16: qty 2

## 2021-01-16 MED ORDER — ENOXAPARIN SODIUM 40 MG/0.4ML IJ SOSY: 40.0000 mg | PREFILLED_SYRINGE | INTRAMUSCULAR | Status: DC

## 2021-01-16 MED ORDER — METOPROLOL SUCCINATE ER 25 MG PO TB24: 25.0000 mg | ORAL_TABLET | Freq: Every morning | ORAL | Status: DC

## 2021-01-16 MED ORDER — ALBUTEROL SULFATE HFA 108 (90 BASE) MCG/ACT IN AERS
INHALATION_SPRAY | RESPIRATORY_TRACT | Status: AC
  Administered 2021-01-16: 1 via RESPIRATORY_TRACT
  Filled 2021-01-16: qty 6.7

## 2021-01-16 NOTE — Progress Notes (Signed)
PROGRESS NOTE        PATIENT DETAILS Name: Francisco [name] Age: 60 y.o. Sex: male Date of Birth: 07-11-1960 Admit Date: 01/15/2021 Admitting Physician Angie Fava, DO DUK:[id number], No Pcp Per (Inactive)  Brief Narrative: Patient is a 60 y.o. male with history of COPD, HFrEF, IV methamphetamine use-presented with shortness of breath-found to have COPD exacerbation likely provoked by pneumonia.  Subjective: Feels better-still wheezing-and " somewhat tight" in the chest.  Objective: Vitals: Blood pressure (!) 137/101, pulse (!) 107, temperature (!) 97.5 F (36.4 C), temperature source Axillary, resp. rate (!) 21, height 6' (1.829 m), weight 99.8 kg, SpO2 95 %.   Exam: Gen Exam:Alert awake-not in any distress HEENT:atraumatic, normocephalic Chest: Still with expiratory rhonchi all over. CVS:S1S2 regular Abdomen:soft non tender, non distended Extremities:no edema Neurology: Non focal Skin: no rash  Pertinent Labs/Radiology: WBC: 11.0 Hb: 17.3 Na: 138 K: 4.6 Creatinine: 0.94 UDS: Positive for amphetamine/tetrahydrocannabinol  9/29>>Blood culture: No growth  9/29>>CXR: Left lower lobe PNA   Assessment/Plan: COPD exacerbation: Provoked by pneumonia-improving-not yet at baseline-still wheezing-continue steroids/bronchodilators.  Sepsis due to left lobar pneumonia: Sepsis physiology rapidly improving-continue IV Rocephin/doxycycline-cultures negative so far.  HFrEF: Do not think he has an exacerbation-volume status is stable-continue diuretics but changed to oral route.  Continue lisinopril-resume metoprolol.  Needs to follow-up with primary cardiologist-for initiation of Entresto-reviewed last outpatient cardiology note-?  Compliance to medications.  Minimally elevated troponin: Trend is flat-not consistent with ACS.  Doubt any clinical significance.  IV methamphetamine use: Counseled  Tobacco use: Counseled  Procedures: None Consults:  None DVT Prophylaxis: Lovenox Code Status:Full code  Family Communication: None at bedside  Time spent: 35 minutes-Greater than 50% of this time was spent in counseling, explanation of diagnosis, planning of further management, and coordination of care.  Diet: Diet Order             Diet Heart Room service appropriate? Yes; Fluid consistency: Thin  Diet effective now                    Disposition Plan: Status is: Inpatient  Remains inpatient appropriate because:Inpatient level of care appropriate due to severity of illness  Dispo: The patient is from: Home              Anticipated d/c is to: Home              Patient currently is not medically stable to d/c.   Difficult to place patient No    Barriers to Discharge: COPD exacerbation/pneumonia-still wheezing-not yet at baseline-not stable for discharge-likely discharge on 10/01 clinically improved.  Antimicrobial agents: Anti-infectives (From admission, onward)    Start     Dose/Rate Route Frequency Ordered Stop   01/16/21 1600  cefTRIAXone (ROCEPHIN) 1 g in sodium chloride 0.9 % 100 mL IVPB        1 g 200 mL/hr over 30 Minutes Intravenous Every 24 hours 01/15/21 1851     01/15/21 1700  doxycycline (VIBRA-TABS) tablet 100 mg        100 mg Oral Every 12 hours 01/15/21 1652     01/15/21 1545  cefTRIAXone (ROCEPHIN) 1 g in sodium chloride 0.9 % 100 mL IVPB        1 g 200 mL/hr over 30 Minutes Intravenous  Once 01/15/21 1543 01/15/21 1718  MEDICATIONS: Scheduled Meds:  aspirin EC  81 mg Oral Daily   doxycycline  100 mg Oral Q12H   furosemide  40 mg Intravenous BID   ipratropium  0.5 mg Nebulization Q6H   levalbuterol  1.25 mg Nebulization Q6H   lisinopril  40 mg Oral Daily   methylPREDNISolone (SOLU-MEDROL) injection  80 mg Intravenous BID   Continuous Infusions:  cefTRIAXone (ROCEPHIN)  IV     PRN Meds:.acetaminophen **OR** acetaminophen, hydrALAZINE, levalbuterol   I have personally reviewed  following labs and imaging studies  LABORATORY DATA: CBC: Recent Labs  Lab 01/15/21 1236 01/16/21 0415  WBC 15.0* 11.0*  NEUTROABS 10.7* 10.0*  HGB 15.5 17.3*  HCT 47.5 51.7  MCV 97.5 95.9  PLT 316 305    Basic Metabolic Panel: Recent Labs  Lab 01/15/21 1236 01/15/21 1842 01/16/21 0415  NA 135  --  138  K 3.8  --  4.6  CL 99  --  101  CO2 24  --  25  GLUCOSE 123*  --  130*  BUN 11  --  16  CREATININE 1.02  --  0.94  CALCIUM 9.1  --  9.3  MG  --  1.6* 2.5*  PHOS  --   --  3.0    GFR: Estimated Creatinine Clearance: 102.2 mL/min (by C-G formula based on SCr of 0.94 mg/dL).  Liver Function Tests: Recent Labs  Lab 01/16/21 0415  AST 30  ALT 44  ALKPHOS 62  BILITOT 1.0  PROT 6.9  ALBUMIN 3.2*   No results for input(s): LIPASE, AMYLASE in the last 168 hours. No results for input(s): AMMONIA in the last 168 hours.  Coagulation Profile: No results for input(s): INR, PROTIME in the last 168 hours.  Cardiac Enzymes: No results for input(s): CKTOTAL, CKMB, CKMBINDEX, TROPONINI in the last 168 hours.  BNP (last 3 results) No results for input(s): PROBNP in the last 8760 hours.  Lipid Profile: No results for input(s): CHOL, HDL, LDLCALC, TRIG, CHOLHDL, LDLDIRECT in the last 72 hours.  Thyroid Function Tests: No results for input(s): TSH, T4TOTAL, FREET4, T3FREE, THYROIDAB in the last 72 hours.  Anemia Panel: No results for input(s): VITAMINB12, FOLATE, FERRITIN, TIBC, IRON, RETICCTPCT in the last 72 hours.  Urine analysis:    Component Value Date/Time   COLORURINE YELLOW 01/16/2021 0842   APPEARANCEUR CLEAR 01/16/2021 0842   LABSPEC 1.017 01/16/2021 0842   PHURINE 5.0 01/16/2021 0842   GLUCOSEU >=500 (A) 01/16/2021 0842   HGBUR NEGATIVE 01/16/2021 0842   BILIRUBINUR NEGATIVE 01/16/2021 0842   KETONESUR NEGATIVE 01/16/2021 0842   PROTEINUR NEGATIVE 01/16/2021 0842   NITRITE NEGATIVE 01/16/2021 0842   LEUKOCYTESUR NEGATIVE 01/16/2021 0842     Sepsis Labs: Lactic Acid, Venous    Component Value Date/Time   LATICACIDVEN 0.9 01/15/2021 1629    MICROBIOLOGY: Recent Results (from the past 240 hour(s))  Resp Panel by RT-PCR (Flu A&B, Covid) Nasopharyngeal Swab     Status: None   Collection Time: 01/15/21 12:38 PM   Specimen: Nasopharyngeal Swab; Nasopharyngeal(NP) swabs in vial transport medium  Result Value Ref Range Status   SARS Coronavirus 2 by RT PCR NEGATIVE NEGATIVE Final    Comment: (NOTE) SARS-CoV-2 target nucleic acids are NOT DETECTED.  The SARS-CoV-2 RNA is generally detectable in upper respiratory specimens during the acute phase of infection. The lowest concentration of SARS-CoV-2 viral copies this assay can detect is 138 copies/mL. A negative result does not preclude SARS-Cov-2 infection and should not be used as  the sole basis for treatment or other patient management decisions. A negative result may occur with  improper specimen collection/handling, submission of specimen other than nasopharyngeal swab, presence of viral mutation(s) within the areas targeted by this assay, and inadequate number of viral copies(<138 copies/mL). A negative result must be combined with clinical observations, patient history, and epidemiological information. The expected result is Negative.  Fact Sheet for Patients:  BloggerCourse.com  Fact Sheet for Healthcare Providers:  SeriousBroker.it  This test is no t yet approved or cleared by the [place] FDA and  has been authorized for detection and/or diagnosis of SARS-CoV-2 by FDA under an Emergency Use Authorization (EUA). This EUA will remain  in effect (meaning this test can be used) for the duration of the COVID-19 declaration under Section 564(b)(1) of the Act, 21 U.S.C.section 360bbb-3(b)(1), unless the authorization is terminated  or revoked sooner.       Influenza A by PCR NEGATIVE NEGATIVE Final    Influenza B by PCR NEGATIVE NEGATIVE Final    Comment: (NOTE) The Xpert Xpress SARS-CoV-2/FLU/RSV plus assay is intended as an aid in the diagnosis of influenza from Nasopharyngeal swab specimens and should not be used as a sole basis for treatment. Nasal washings and aspirates are unacceptable for Xpert Xpress SARS-CoV-2/FLU/RSV testing.  Fact Sheet for Patients: BloggerCourse.com  Fact Sheet for Healthcare Providers: SeriousBroker.it  This test is not yet approved or cleared by the [place] FDA and has been authorized for detection and/or diagnosis of SARS-CoV-2 by FDA under an Emergency Use Authorization (EUA). This EUA will remain in effect (meaning this test can be used) for the duration of the COVID-19 declaration under Section 564(b)(1) of the Act, 21 U.S.C. section 360bbb-3(b)(1), unless the authorization is terminated or revoked.  Performed at [facility], [address] [place], [place] [address]   Blood culture (routine x 2)     Status: None (Preliminary result)   Collection Time: 01/15/21  6:41 PM   Specimen: BLOOD  Result Value Ref Range Status   Specimen Description BLOOD RIGHT ANTECUBITAL  Final   Special Requests   Final    BOTTLES DRAWN AEROBIC AND ANAEROBIC Blood Culture adequate volume   Culture   Final    NO GROWTH < 24 HOURS Performed at [facility], [address], [place], [place] [address]    Report Status PENDING  Incomplete    RADIOLOGY STUDIES/RESULTS: DG Chest 2 View  Result Date: 01/15/2021 CLINICAL DATA:  Shortness of breath and cough for 1 week also with dizziness in a 60 year old male. EXAM: CHEST - 2 VIEW COMPARISON:  December 30, 2020. FINDINGS: Trachea is midline. Cardiomediastinal contours and hilar structures are normal. New opacity in the LEFT lower lobe, this is developed since the previous exam. No sign of pleural effusion. No visible pneumothorax. On limited  assessment there is no acute skeletal process. IMPRESSION: New opacity in the LEFT lower lobe suspicious for developing pneumonia, suggest follow-up to ensure resolution given mildly nodular appearance. Electronically Signed   By: Donzetta Kohut M.D.   On: 01/15/2021 13:31     LOS: 1 day   Jeoffrey Massed, MD  [facility]    To contact the attending provider between 7A-7P or the covering provider during after hours 7P-7A, please log into the web site [email or web address] and access using universal [place] password for that web site. If you do not have the password, please call the hospital operator.  01/16/2021, 11:38 AM

## 2021-01-16 NOTE — ED Notes (Signed)
Pt refused to be on the monitor, this tech informed pt of importance of being on the monitor for safety. Pt is very frustrated and states he is leaving AMA due to being moved rooms.

## 2021-01-16 NOTE — ED Notes (Signed)
This RN notified Dr. Arlean Hopping of pt BP after hydralazine and that pt continues to be drenched in sweat

## 2021-01-16 NOTE — ED Notes (Addendum)
Patient signed AMA form. Patient refused vitals prior to leaving.

## 2021-01-16 NOTE — ED Notes (Signed)
Patient refusing to have vital signs taken and to take medications. Patient wanting to leave AMA because his room changed. Provider messaged.

## 2021-01-16 NOTE — ED Notes (Signed)
This RN spoke with MD Chotiner about pt BP - MD advised this RN to give PRN hydralazine

## 2021-01-16 NOTE — ED Notes (Signed)
MD Howerter notified of pt BP and pt continuing to have episodes of diaphoresis. MD okay to go ahead and give hydralazine at this time. Pt refused lasix at this time, MD also aware

## 2021-01-16 NOTE — ED Notes (Signed)
This RN spoke to Francisco [name] who said that we could mix pts nebulizer treatments

## 2021-01-20 LAB — CULTURE, BLOOD (ROUTINE X 2)
Culture: NO GROWTH
Special Requests: ADEQUATE

## 2021-01-20 NOTE — Discharge Summary (Signed)
PATIENT DETAILS Name: Francisco [name] Age: 60 y.o. Sex: male Date of Birth: [birth year] MRN: [id number]. Admitting Physician: Maretta Bees, MD [id number], No Pcp Per (Inactive)  Admit Date: 01/15/2021 Discharge date: 01/16/2021  Note:patient left AMA  Recommendations for Outpatient Follow-up:  Follow-up with PCP Repeat CBC/chemistry panel in 1 week  PRIMARY DISCHARGE DIAGNOSIS:  Principal Problem:   CAP (community acquired pneumonia) Active Problems:   Elevated troponin   Acute on chronic combined systolic and diastolic CHF (congestive heart failure) (HCC)   COPD with acute exacerbation (HCC)   Severe sepsis (HCC)   Hypomagnesemia   Tobacco abuse   COPD exacerbation (HCC)      PAST MEDICAL HISTORY: Past Medical History:  Diagnosis Date   CHF (congestive heart failure) (HCC)    COPD (chronic obstructive pulmonary disease) (HCC)    Coronary artery disease    a. s/p prior PCI.   Hepatitis-C    Hypertension    IV drug abuse (HCC)    a. 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   Medically noncompliant    MI (myocardial infarction) (HCC)    Tobacco abuse     ALLERGIES:   Allergies  Allergen Reactions   Ivp Dye [Iodinated Diagnostic Agents] Other (See Comments)    Seizures    Tramadol Other (See Comments)    Seizures     BRIEF HPI: Patient is a 60 y.o. male with history of COPD, HFrEF, IV methamphetamine use-presented with shortness of breath-found to have COPD exacerbation likely provoked by pneumonia.  HOSPITAL COURSE BY PROBLEM LIST COPD exacerbation: Provoked by pneumonia-improving-not yet at baseline-still wheezing-plans were to continue steroids/bronchodilators.  Unfortunately patient signed out AGAINST MEDICAL ADVICE.   Sepsis due to left lobar pneumonia: Sepsis physiology was rapidly improving-plans were to continue Rocephin/doxycycline-follow culture data-however patient signed out AGAINST MEDICAL ADVICE.     HFrEF: Do not think he has an  exacerbation-volume status is stable-continue diuretics but changed to oral route.  Continue lisinopril-resume metoprolol.  Needs to follow-up with primary cardiologist-for initiation of Entresto-reviewed last outpatient cardiology note-?  Compliance to medications.   Minimally elevated troponin: Trend is flat-not consistent with ACS.  Doubt any clinical significance.   IV methamphetamine use: Counseled   Tobacco use: Counseled  CONSULTATIONS:   None  PERTINENT RADIOLOGIC STUDIES: DG Chest 2 View  Result Date: 01/15/2021 CLINICAL DATA:  Shortness of breath and cough for 1 week also with dizziness in a 60 year old male. EXAM: CHEST - 2 VIEW COMPARISON:  December 30, 2020. FINDINGS: Trachea is midline. Cardiomediastinal contours and hilar structures are normal. New opacity in the LEFT lower lobe, this is developed since the previous exam. No sign of pleural effusion. No visible pneumothorax. On limited assessment there is no acute skeletal process. IMPRESSION: New opacity in the LEFT lower lobe suspicious for developing pneumonia, suggest follow-up to ensure resolution given mildly nodular appearance. Electronically Signed   By: Donzetta Kohut M.D.   On: 01/15/2021 13:31   DG Chest Port 1 View  Result Date: 12/30/2020 CLINICAL DATA:  Cough, shortness of breath, fever EXAM: PORTABLE CHEST 1 VIEW COMPARISON:  Chest x-ray 12/16/2020, CT chest 11/04/2018 FINDINGS: The heart and mediastinal contours are within normal limits. No focal consolidation. No pulmonary edema. No pleural effusion. No pneumothorax. No acute osseous abnormality. IMPRESSION: No active disease. Electronically Signed   By: Tish Frederickson M.D.   On: 12/30/2020 21:54     PERTINENT LAB RESULTS: CBC: No results for input(s): WBC, HGB, HCT, PLT in the last  72 hours. CMET CMP     Component Value Date/Time   NA 138 01/16/2021 0415   K 4.6 01/16/2021 0415   CL 101 01/16/2021 0415   CO2 25 01/16/2021 0415   GLUCOSE 130 (H)  01/16/2021 0415   BUN 16 01/16/2021 0415   CREATININE 0.94 01/16/2021 0415   CALCIUM 9.3 01/16/2021 0415   PROT 6.9 01/16/2021 0415   ALBUMIN 3.2 (L) 01/16/2021 0415   AST 30 01/16/2021 0415   ALT 44 01/16/2021 0415   ALKPHOS 62 01/16/2021 0415   BILITOT 1.0 01/16/2021 0415   GFRNONAA >60 01/16/2021 0415   GFRAA >60 01/11/2020 0422    GFR Estimated Creatinine Clearance: 102.2 mL/min (by C-G formula based on SCr of 0.94 mg/dL). No results for input(s): LIPASE, AMYLASE in the last 72 hours. No results for input(s): CKTOTAL, CKMB, CKMBINDEX, TROPONINI in the last 72 hours. Invalid input(s): POCBNP No results for input(s): DDIMER in the last 72 hours. No results for input(s): HGBA1C in the last 72 hours. No results for input(s): CHOL, HDL, LDLCALC, TRIG, CHOLHDL, LDLDIRECT in the last 72 hours. No results for input(s): TSH, T4TOTAL, T3FREE, THYROIDAB in the last 72 hours.  Invalid input(s): FREET3 No results for input(s): VITAMINB12, FOLATE, FERRITIN, TIBC, IRON, RETICCTPCT in the last 72 hours. Coags: No results for input(s): INR in the last 72 hours.  Invalid input(s): PT Microbiology: Recent Results (from the past 240 hour(s))  Resp Panel by RT-PCR (Flu A&B, Covid) Nasopharyngeal Swab     Status: None   Collection Time: 01/15/21 12:38 PM   Specimen: Nasopharyngeal Swab; Nasopharyngeal(NP) swabs in vial transport medium  Result Value Ref Range Status   SARS Coronavirus 2 by RT PCR NEGATIVE NEGATIVE Final    Comment: (NOTE) SARS-CoV-2 target nucleic acids are NOT DETECTED.  The SARS-CoV-2 RNA is generally detectable in upper respiratory specimens during the acute phase of infection. The lowest concentration of SARS-CoV-2 viral copies this assay can detect is 138 copies/mL. A negative result does not preclude SARS-Cov-2 infection and should not be used as the sole basis for treatment or other patient management decisions. A negative result may occur with  improper specimen  collection/handling, submission of specimen other than nasopharyngeal swab, presence of viral mutation(s) within the areas targeted by this assay, and inadequate number of viral copies(<138 copies/mL). A negative result must be combined with clinical observations, patient history, and epidemiological information. The expected result is Negative.  Fact Sheet for Patients:  BloggerCourse.com  Fact Sheet for Healthcare Providers:  SeriousBroker.it  This test is no t yet approved or cleared by the [place] FDA and  has been authorized for detection and/or diagnosis of SARS-CoV-2 by FDA under an Emergency Use Authorization (EUA). This EUA will remain  in effect (meaning this test can be used) for the duration of the COVID-19 declaration under Section 564(b)(1) of the Act, 21 U.S.C.section 360bbb-3(b)(1), unless the authorization is terminated  or revoked sooner.       Influenza A by PCR NEGATIVE NEGATIVE Final   Influenza B by PCR NEGATIVE NEGATIVE Final    Comment: (NOTE) The Xpert Xpress SARS-CoV-2/FLU/RSV plus assay is intended as an aid in the diagnosis of influenza from Nasopharyngeal swab specimens and should not be used as a sole basis for treatment. Nasal washings and aspirates are unacceptable for Xpert Xpress SARS-CoV-2/FLU/RSV testing.  Fact Sheet for Patients: BloggerCourse.com  Fact Sheet for Healthcare Providers: SeriousBroker.it  This test is not yet approved or cleared by the [place]  FDA and has been authorized for detection and/or diagnosis of SARS-CoV-2 by FDA under an Emergency Use Authorization (EUA). This EUA will remain in effect (meaning this test can be used) for the duration of the COVID-19 declaration under Section 564(b)(1) of the Act, 21 U.S.C. section 360bbb-3(b)(1), unless the authorization is terminated or revoked.  Performed at [facility], [address] [place], [place] [address]   Blood culture (routine x 2)     Status: None   Collection Time: 01/15/21  6:41 PM   Specimen: BLOOD  Result Value Ref Range Status   Specimen Description BLOOD RIGHT ANTECUBITAL  Final   Special Requests   Final    BOTTLES DRAWN AEROBIC AND ANAEROBIC Blood Culture adequate volume   Culture   Final    NO GROWTH 5 DAYS Performed at [facility], [address], [place], [place] [address]    Report Status 01/20/2021 FINAL  Final     TODAY-DAY OF DISCHARGE:  Subjective:   Francisco [name] today has signed out against medical advice. He was warned about the life threatening and life disabling effects by RN.   Objective:   Blood pressure (!) 150/95, pulse (!) 105, temperature (!) 97.5 F (36.4 C), temperature source Axillary, resp. rate 18, height 6' (1.829 m), weight 99.8 kg, SpO2 94 %.   DISCHARGE CONDITION: Not stable for discharge-left AMA  DISPOSITION: AMA   Follow with your PCP in 1 week   Total Time spent on discharge equals 25  minutes.  SignedJeoffrey Massed 01/20/2021 2:24 PM

## 2021-02-13 ENCOUNTER — Encounter (HOSPITAL_COMMUNITY): Payer: Self-pay | Admitting: Radiology

## 2021-04-08 ENCOUNTER — Encounter (HOSPITAL_COMMUNITY): Payer: Self-pay | Admitting: Emergency Medicine

## 2021-04-08 ENCOUNTER — Emergency Department (HOSPITAL_COMMUNITY): Payer: Self-pay

## 2021-04-08 ENCOUNTER — Emergency Department (HOSPITAL_COMMUNITY)
Admission: EM | Admit: 2021-04-08 | Discharge: 2021-04-08 | Disposition: A | Discharge status: Home / Self Care | Payer: Self-pay | Attending: Emergency Medicine | Admitting: Emergency Medicine

## 2021-04-08 ENCOUNTER — Other Ambulatory Visit: Payer: Self-pay

## 2021-04-08 DIAGNOSIS — I110 Hypertensive heart disease with heart failure: Secondary | ICD-10-CM | POA: Insufficient documentation

## 2021-04-08 DIAGNOSIS — I2510 Atherosclerotic heart disease of native coronary artery without angina pectoris: Secondary | ICD-10-CM | POA: Insufficient documentation

## 2021-04-08 DIAGNOSIS — R000 Tachycardia, unspecified: Secondary | ICD-10-CM | POA: Insufficient documentation

## 2021-04-08 DIAGNOSIS — R0602 Shortness of breath: Secondary | ICD-10-CM

## 2021-04-08 DIAGNOSIS — Z79899 Other long term (current) drug therapy: Secondary | ICD-10-CM | POA: Insufficient documentation

## 2021-04-08 DIAGNOSIS — Z7982 Long term (current) use of aspirin: Secondary | ICD-10-CM | POA: Insufficient documentation

## 2021-04-08 DIAGNOSIS — I10 Essential (primary) hypertension: Secondary | ICD-10-CM

## 2021-04-08 DIAGNOSIS — F17210 Nicotine dependence, cigarettes, uncomplicated: Secondary | ICD-10-CM | POA: Insufficient documentation

## 2021-04-08 DIAGNOSIS — I5043 Acute on chronic combined systolic (congestive) and diastolic (congestive) heart failure: Secondary | ICD-10-CM | POA: Insufficient documentation

## 2021-04-08 DIAGNOSIS — J441 Chronic obstructive pulmonary disease with (acute) exacerbation: Secondary | ICD-10-CM | POA: Insufficient documentation

## 2021-04-08 DIAGNOSIS — I509 Heart failure, unspecified: Secondary | ICD-10-CM

## 2021-04-08 DIAGNOSIS — Z20822 Contact with and (suspected) exposure to covid-19: Secondary | ICD-10-CM | POA: Insufficient documentation

## 2021-04-08 LAB — COMPREHENSIVE METABOLIC PANEL
ALT: 29 U/L (ref 0–44)
AST: 24 U/L (ref 15–41)
Albumin: 3.7 g/dL (ref 3.5–5.0)
Alkaline Phosphatase: 53 U/L (ref 38–126)
Anion gap: 9 (ref 5–15)
BUN: 13 mg/dL (ref 6–20)
CO2: 26 mmol/L (ref 22–32)
Calcium: 9.2 mg/dL (ref 8.9–10.3)
Chloride: 101 mmol/L (ref 98–111)
Creatinine, Ser: 1.09 mg/dL (ref 0.61–1.24)
GFR, Estimated: >60 mL/min (ref >60)
Glucose, Bld: 135 mg/dL — ABNORMAL HIGH (ref 70–99)
Potassium: 3.5 mmol/L (ref 3.5–5.1)
Sodium: 136 mmol/L (ref 135–145)
Total Bilirubin: 0.9 mg/dL (ref 0.3–1.2)
Total Protein: 7.3 g/dL (ref 6.5–8.1)

## 2021-04-08 LAB — CBC WITH DIFFERENTIAL/PLATELET
Abs Immature Granulocytes: 0.04 10*3/uL (ref 0.00–0.07)
Basophils Absolute: 0.1 10*3/uL (ref 0.0–0.1)
Basophils Relative: 1 %
Eosinophils Absolute: 0.1 10*3/uL (ref 0.0–0.5)
Eosinophils Relative: 1 %
HCT: 45.9 % (ref 39.0–52.0)
Hemoglobin: 15.3 g/dL (ref 13.0–17.0)
Immature Granulocytes: 0 %
Lymphocytes Relative: 24 %
Lymphs Abs: 2.4 10*3/uL (ref 0.7–4.0)
MCH: 32.3 pg (ref 26.0–34.0)
MCHC: 33.3 g/dL (ref 30.0–36.0)
MCV: 96.8 fL (ref 80.0–100.0)
Monocytes Absolute: 0.8 10*3/uL (ref 0.1–1.0)
Monocytes Relative: 8 %
Neutro Abs: 6.7 10*3/uL (ref 1.7–7.7)
Neutrophils Relative %: 66 %
Platelets: 296 10*3/uL (ref 150–400)
RBC: 4.74 MIL/uL (ref 4.22–5.81)
RDW: 13.1 % (ref 11.5–15.5)
WBC: 10.1 10*3/uL (ref 4.0–10.5)
nRBC: 0 % (ref 0.0–0.2)

## 2021-04-08 LAB — TROPONIN I (HIGH SENSITIVITY)
Troponin I (High Sensitivity): 51 ng/L — ABNORMAL HIGH (ref <18)
Troponin I (High Sensitivity): 54 ng/L — ABNORMAL HIGH (ref <18)

## 2021-04-08 LAB — RESP PANEL BY RT-PCR (FLU A&B, COVID) ARPGX2
Influenza A by PCR: NEGATIVE
Influenza B by PCR: NEGATIVE
SARS Coronavirus 2 by RT PCR: NEGATIVE

## 2021-04-08 LAB — BRAIN NATRIURETIC PEPTIDE: B Natriuretic Peptide: 1289.2 pg/mL — ABNORMAL HIGH (ref 0.0–100.0)

## 2021-04-08 MED ORDER — METOPROLOL TARTRATE 25 MG PO TABS
50.0000 mg | ORAL_TABLET | Freq: Once | ORAL | Status: AC
  Administered 2021-04-08: 19:00:00 50 mg via ORAL
  Filled 2021-04-08: qty 2

## 2021-04-08 MED ORDER — PREDNISONE 20 MG PO TABS: 20.0000 mg | ORAL_TABLET | Freq: Two times a day (BID) | ORAL | 0 refills | Status: AC

## 2021-04-08 MED ORDER — IPRATROPIUM BROMIDE 0.02 % IN SOLN
0.5000 mg | Freq: Once | RESPIRATORY_TRACT | Status: AC
  Administered 2021-04-08: 15:00:00 0.5 mg via RESPIRATORY_TRACT
  Filled 2021-04-08: qty 2.5

## 2021-04-08 MED ORDER — ALBUTEROL SULFATE HFA 108 (90 BASE) MCG/ACT IN AERS: 2.0000 | INHALATION_SPRAY | RESPIRATORY_TRACT | 0 refills | Status: DC

## 2021-04-08 MED ORDER — FUROSEMIDE 10 MG/ML IJ SOLN
60.0000 mg | Freq: Once | INTRAMUSCULAR | Status: AC
  Administered 2021-04-08: 17:00:00 60 mg via INTRAVENOUS
  Filled 2021-04-08: qty 6

## 2021-04-08 MED ORDER — ALBUTEROL SULFATE (2.5 MG/3ML) 0.083% IN NEBU: 2.5000 mg | INHALATION_SOLUTION | RESPIRATORY_TRACT | 0 refills | Status: DC | PRN

## 2021-04-08 MED ORDER — ALBUTEROL SULFATE (2.5 MG/3ML) 0.083% IN NEBU
5.0000 mg | INHALATION_SOLUTION | Freq: Once | RESPIRATORY_TRACT | Status: AC
  Administered 2021-04-08: 15:00:00 5 mg via RESPIRATORY_TRACT
  Filled 2021-04-08: qty 6

## 2021-04-08 MED ORDER — PREDNISONE 20 MG PO TABS
60.0000 mg | ORAL_TABLET | Freq: Once | ORAL | Status: AC
  Administered 2021-04-08: 15:00:00 60 mg via ORAL
  Filled 2021-04-08: qty 3

## 2021-04-08 MED ORDER — FUROSEMIDE 40 MG PO TABS: 40.0000 mg | ORAL_TABLET | Freq: Two times a day (BID) | ORAL | 0 refills | Status: DC

## 2021-04-08 MED ORDER — POTASSIUM CHLORIDE CRYS ER 10 MEQ PO TBCR: 10.0000 meq | EXTENDED_RELEASE_TABLET | Freq: Every day | ORAL | 0 refills | Status: DC

## 2021-04-08 MED ORDER — ALBUTEROL SULFATE (2.5 MG/3ML) 0.083% IN NEBU: 2.5000 mg | INHALATION_SOLUTION | Freq: Once | RESPIRATORY_TRACT | Status: DC

## 2021-04-08 MED ORDER — ASPIRIN EC 81 MG PO TBEC: 81.0000 mg | DELAYED_RELEASE_TABLET | Freq: Every day | ORAL | 0 refills | Status: DC

## 2021-04-08 MED ORDER — METOPROLOL SUCCINATE ER 25 MG PO TB24: 25.0000 mg | ORAL_TABLET | Freq: Every morning | ORAL | 0 refills | Status: DC

## 2021-04-08 MED ORDER — LABETALOL HCL 5 MG/ML IV SOLN
10.0000 mg | Freq: Once | INTRAVENOUS | Status: AC
  Administered 2021-04-08: 19:00:00 10 mg via INTRAVENOUS
  Filled 2021-04-08: qty 4

## 2021-04-08 NOTE — ED Triage Notes (Signed)
Pt endorses SOB and mild CP. States he can't walk far without using his inhaler. Hx of COPD and CHF.

## 2021-04-08 NOTE — ED Provider Notes (Signed)
Emergency Medicine Provider Triage Evaluation Note  Francisco [name] , a 60 y.o. male  was evaluated in triage.  Pt complains of shortness of breath, wheezing, productive cough.  Patient has a history of COPD and CHF.  He has had progressively worsening shortness of breath and wheezing over the past 5 days.  He has been using his inhaler and nebulizer frequently.  He has 4 out of 10 chest pain currently.  No radiation or vomiting.  No reported fevers.  Review of Systems  Positive: Shortness of breath, wheezing Negative: Lower extremity swelling  Physical Exam  BP (!) 189/140 (BP Location: Right Arm)    Pulse (!) 130    Temp 98.4 F (36.9 C) (Oral)    Resp 20    SpO2 94%  Gen:   Awake, mild distress Resp:  Prolonged expiration, decreased air movement globally, expiratory wheezing throughout MSK:   Moves extremities without difficulty Other:  Tachycardia  Medical Decision Making  Medically screening exam initiated at 2:57 PM.  Appropriate orders placed.  Francisco [name] was informed that the remainder of the evaluation will be completed by another provider, this initial triage assessment does not replace that evaluation, and the importance of remaining in the ED until their evaluation is complete.     Francisco Crigler, PA-C 04/08/21 1458    Francisco Cockayne, MD 04/08/21 1924

## 2021-04-08 NOTE — ED Provider Notes (Signed)
[facility] EMERGENCY DEPARTMENT Provider Note   CSN: [id number] Arrival date & time: 04/08/21  1448     History Chief Complaint  Patient presents with   Shortness of Breath    Francisco [name] is a 60 y.o. male.  HPI     Past Medical History:  Diagnosis Date   CHF (congestive heart failure) (HCC)    COPD (chronic obstructive pulmonary disease) (HCC)    Coronary artery disease    a. s/p prior PCI.   Hepatitis-C    Hypertension    IV drug abuse (HCC)    a. 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   Medically noncompliant    MI (myocardial infarction) (HCC)    Tobacco abuse     Patient Active Problem List   Diagnosis Date Noted   Severe sepsis (HCC) 01/16/2021   Hypomagnesemia 01/16/2021   Tobacco abuse 01/16/2021   COPD exacerbation (HCC) 01/16/2021   CAP (community acquired pneumonia) 01/15/2021   Polysubstance abuse (HCC) 08/10/2020   Acute on chronic heart failure with preserved ejection fraction (HFpEF) (HCC) 08/09/2020   NSTEMI (non-ST elevated myocardial infarction) (HCC) 08/09/2020   Prolonged QT interval 08/09/2020   CHF, acute on chronic (HCC) 08/08/2020   SOB (shortness of breath)    Hypertensive urgency    Acute on chronic respiratory failure with hypoxia and hypercapnia (HCC) 01/10/2020   COPD with acute exacerbation (HCC) 01/09/2020   Hypotension 08/18/2019   Acute on chronic combined systolic and diastolic CHF (congestive heart failure) (HCC) 08/17/2019   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   Chronic obstructive pulmonary disease (HCC)    Elevated troponin    Unstable angina (HCC)    Dilated cardiomyopathy (HCC)    CAD S/P percutaneous coronary angioplasty    ACS (acute coronary syndrome) (HCC) 04/29/2019   Acute respiratory failure with hypoxia (HCC) 06/17/2018    Past Surgical History:  Procedure Laterality Date   BACK SURGERY     NECK SURGERY     RIGHT/LEFT HEART CATH AND CORONARY ANGIOGRAPHY N/A 04/30/2019    Procedure: RIGHT/LEFT HEART CATH AND CORONARY ANGIOGRAPHY;  Surgeon: Marykay Lex, MD;  Location: [facility] INVASIVE CV LAB;  Service: Cardiovascular;  Laterality: N/A;   stints         Family History  Problem Relation Age of Onset   Rheum arthritis Mother     Social History   Tobacco Use   Smoking status: Every Day    Packs/day: 0.75    Types: Cigarettes   Smokeless tobacco: Never  Vaping Use   Vaping Use: Never used  Substance Use Topics   Alcohol use: Never   Drug use: Yes    Types: Methamphetamines    Comment: heroin    Home Medications Prior to Admission medications   Medication Sig Start Date End Date Taking? Authorizing Provider  predniSONE (DELTASONE) 20 MG tablet Take 1 tablet (20 mg total) by mouth 2 (two) times daily with a meal for 5 days. 04/08/21 04/13/21 Yes Cheryll Cockayne, MD  albuterol (PROVENTIL) (2.5 MG/3ML) 0.083% nebulizer solution Take 3 mLs (2.5 mg total) by nebulization every 4 (four) hours as needed for wheezing or shortness of breath. 04/08/21 05/08/21  Cheryll Cockayne, MD  albuterol (VENTOLIN HFA) 108 (90 Base) MCG/ACT inhaler Inhale 2 puffs into the lungs See admin instructions. Inhale 2 puffs into the lungs every two to four hours as needed for wheezing or shortness of breath 04/08/21 05/08/21  Cheryll Cockayne, MD  aspirin EC 81 MG  tablet Take 1 tablet (81 mg total) by mouth daily. 04/08/21 05/08/21  Cheryll Cockayne, MD  atorvastatin (LIPITOR) 40 MG tablet Take 1 tablet (40 mg total) by mouth daily. Patient not taking: Reported on 01/15/2021 08/11/20   Alessandra Bevels, MD  atorvastatin (LIPITOR) 80 MG tablet TAKE 1 TABLET (80 MG TOTAL) BY MOUTH DAILY AT 6 PM. Patient not taking: Reported on 01/15/2021 05/06/20 05/06/21  Lorelee New, PA-C  atorvastatin (LIPITOR) 80 MG tablet TAKE 1 TABLET (80 MG TOTAL) BY MOUTH DAILY AT 6 PM. Patient not taking: Reported on 01/15/2021 01/17/20 01/16/21  Anders Simmonds, PA-C  azithromycin (ZITHROMAX) 250 MG tablet TAKE 2  TABLETS BY MOUTH ON DAY, TAKE 1 TABLET ON DAYS 2 THROUGH 5. Patient not taking: Reported on 01/15/2021 05/06/20 05/06/21  Lorelee New, PA-C  carvedilol (COREG) 25 MG tablet TAKE 1 TABLET (25 MG TOTAL) BY MOUTH 2 (TWO) TIMES DAILY WITH A MEAL. Patient not taking: Reported on 01/15/2021 10/14/20 10/14/21  Geoffery Lyons, MD  furosemide (LASIX) 40 MG tablet Take 1 tablet (40 mg total) by mouth 2 (two) times daily. 04/08/21 05/08/21  Cheryll Cockayne, MD  lisinopril (ZESTRIL) 40 MG tablet TAKE 1 TABLET (40 MG TOTAL) BY MOUTH DAILY. Patient taking differently: Take 40 mg by mouth in the morning. 12/17/20 12/17/21  Nira Conn, MD  metoprolol succinate (TOPROL-XL) 25 MG 24 hr tablet Take 1 tablet (25 mg total) by mouth in the morning. 04/08/21 05/08/21  Cheryll Cockayne, MD  nitroGLYCERIN (NITROSTAT) 0.4 MG SL tablet Place 1 tablet (0.4 mg total) under the tongue every 5 (five) minutes x 3 doses as needed for chest pain. 08/10/20   Alessandra Bevels, MD  potassium chloride (KLOR-CON M) 10 MEQ tablet Take 1 tablet (10 mEq total) by mouth daily. 04/08/21 05/08/21  Cheryll Cockayne, MD    Allergies    Ivp dye [iodinated diagnostic agents] and Tramadol  Review of Systems   Review of Systems  Physical Exam Updated Vital Signs BP (!) 172/120    Pulse (!) 103    Temp 98.8 F (37.1 C) (Oral)    Resp 20    SpO2 93%   Physical Exam  ED Results / Procedures / Treatments   Labs (all labs ordered are listed, but only abnormal results are displayed) Labs Reviewed  COMPREHENSIVE METABOLIC PANEL - Abnormal; Notable for the following components:      Result Value   Glucose, Bld 135 (*)    All other components within normal limits  BRAIN NATRIURETIC PEPTIDE - Abnormal; Notable for the following components:   B Natriuretic Peptide 1,289.2 (*)    All other components within normal limits  TROPONIN I (HIGH SENSITIVITY) - Abnormal; Notable for the following components:   Troponin I (High Sensitivity) 51 (*)     All other components within normal limits  TROPONIN I (HIGH SENSITIVITY) - Abnormal; Notable for the following components:   Troponin I (High Sensitivity) 54 (*)    All other components within normal limits  RESP PANEL BY RT-PCR (FLU A&B, COVID) ARPGX2  CBC WITH DIFFERENTIAL/PLATELET    EKG EKG Interpretation  Date/Time:  Wednesday April 08 2021 14:58:07 EST Ventricular Rate:  129 PR Interval:  126 QRS Duration: 90 QT Interval:  320 QTC Calculation: 468 R Axis:   66 Text Interpretation: Sinus tachycardia Right atrial enlargement Pulmonary disease pattern Nonspecific ST and T wave abnormality Abnormal ECG Confirmed by Norman Clay ([id number]) on 04/08/2021 4:53:24 PM  Radiology  DG Chest 2 View  Result Date: 04/08/2021 CLINICAL DATA:  SOB, wheezing EXAM: CHEST - 2 VIEW COMPARISON:  Chest x-ray 01/15/2021, CT chest 11/04/2018 FINDINGS: The heart and mediastinal contours are unchanged. Aortic calcification. No focal consolidation. No pulmonary edema. No pleural effusion. No pneumothorax. No acute osseous abnormality. Redemonstration of vertebral body height loss of several midthoracic vertebral bodies. IMPRESSION: No active cardiopulmonary disease. Electronically Signed   By: Tish Frederickson M.D.   On: 04/08/2021 15:49    Procedures Procedures   Medications Ordered in ED Medications  predniSONE (DELTASONE) tablet 60 mg (60 mg Oral Given 04/08/21 1501)  albuterol (PROVENTIL) (2.5 MG/3ML) 0.083% nebulizer solution 5 mg (5 mg Nebulization Given 04/08/21 1502)  ipratropium (ATROVENT) nebulizer solution 0.5 mg (0.5 mg Nebulization Given 04/08/21 1502)  furosemide (LASIX) injection 60 mg (60 mg Intravenous Given 04/08/21 1711)  labetalol (NORMODYNE) injection 10 mg (10 mg Intravenous Given 04/08/21 1836)  metoprolol tartrate (LOPRESSOR) tablet 50 mg (50 mg Oral Given 04/08/21 1836)    ED Course  I have reviewed the triage vital signs and the nursing notes.  Pertinent labs & imaging  results that were available during my care of the patient were reviewed by me and considered in my medical decision making (see chart for details).    MDM Rules/Calculators/A&P                         Patient given breathing treatments here and steroids and Lasix IV.  Subsequently feeling much better satting 90 to 94% on room air which is his baseline.  Patient states he feels much better prefers to follow-up on an outpatient basis.  I did let him know that his proBNP was significantly elevated, patient states he has been off his medications for 6 days as he ran out of "everything."  Otherwise he is not in any respiratory distress prefers to follow-up on an outpatient basis will be discharged home, advised immediate return if he has difficulty breathing otherwise to call his doctor in 2 to 3 days.  Advised return if you have any other questions or any recurrent symptoms.     Final Clinical Impression(s) / ED Diagnoses Final diagnoses:  COPD exacerbation (HCC)  Shortness of breath  Acute on chronic congestive heart failure, unspecified heart failure type (HCC)    Rx / DC Orders ED Discharge Orders          Ordered    albuterol (PROVENTIL) (2.5 MG/3ML) 0.083% nebulizer solution  Every 4 hours PRN        04/08/21 1909    albuterol (VENTOLIN HFA) 108 (90 Base) MCG/ACT inhaler  See admin instructions        04/08/21 1909    aspirin EC 81 MG tablet  Daily        04/08/21 1909    furosemide (LASIX) 40 MG tablet  2 times daily        04/08/21 1909    metoprolol succinate (TOPROL-XL) 25 MG 24 hr tablet  Every morning        04/08/21 1909    potassium chloride (KLOR-CON M) 10 MEQ tablet  Daily        04/08/21 1909    predniSONE (DELTASONE) 20 MG tablet  2 times daily with meals        04/08/21 1909             Cheryll Cockayne, MD 04/08/21 [phone]

## 2021-04-08 NOTE — ED Notes (Signed)
Pt hasn't had any of his meds d/t he ran out and doesn't have refills. Pt requesting [facility] quicker than 1 month out. Pt endorses mild headache, chest tightness and pressure, shob.

## 2021-04-14 ENCOUNTER — Other Ambulatory Visit: Payer: Self-pay

## 2021-04-14 ENCOUNTER — Emergency Department (HOSPITAL_COMMUNITY)
Admission: EM | Admit: 2021-04-14 | Discharge: 2021-04-14 | Disposition: A | Discharge status: Home / Self Care | Payer: Self-pay | Attending: Emergency Medicine | Admitting: Emergency Medicine

## 2021-04-14 ENCOUNTER — Emergency Department (HOSPITAL_COMMUNITY): Payer: Self-pay

## 2021-04-14 DIAGNOSIS — R0602 Shortness of breath: Secondary | ICD-10-CM | POA: Insufficient documentation

## 2021-04-14 DIAGNOSIS — R42 Dizziness and giddiness: Secondary | ICD-10-CM | POA: Insufficient documentation

## 2021-04-14 DIAGNOSIS — R002 Palpitations: Secondary | ICD-10-CM | POA: Insufficient documentation

## 2021-04-14 DIAGNOSIS — R55 Syncope and collapse: Secondary | ICD-10-CM | POA: Insufficient documentation

## 2021-04-14 DIAGNOSIS — R079 Chest pain, unspecified: Secondary | ICD-10-CM | POA: Insufficient documentation

## 2021-04-14 DIAGNOSIS — J441 Chronic obstructive pulmonary disease with (acute) exacerbation: Secondary | ICD-10-CM | POA: Insufficient documentation

## 2021-04-14 DIAGNOSIS — Z5321 Procedure and treatment not carried out due to patient leaving prior to being seen by health care provider: Secondary | ICD-10-CM | POA: Insufficient documentation

## 2021-04-14 LAB — CBC
HCT: 51.6 % (ref 39.0–52.0)
Hemoglobin: 16.7 g/dL (ref 13.0–17.0)
MCH: 31.9 pg (ref 26.0–34.0)
MCHC: 32.4 g/dL (ref 30.0–36.0)
MCV: 98.7 fL (ref 80.0–100.0)
Platelets: 406 10*3/uL — ABNORMAL HIGH (ref 150–400)
RBC: 5.23 MIL/uL (ref 4.22–5.81)
RDW: 13.2 % (ref 11.5–15.5)
WBC: 14.5 10*3/uL — ABNORMAL HIGH (ref 4.0–10.5)
nRBC: 0 % (ref 0.0–0.2)

## 2021-04-14 LAB — BASIC METABOLIC PANEL
Anion gap: 13 (ref 5–15)
BUN: 20 mg/dL (ref 6–20)
CO2: 30 mmol/L (ref 22–32)
Calcium: 10 mg/dL (ref 8.9–10.3)
Chloride: 98 mmol/L (ref 98–111)
Creatinine, Ser: 1.4 mg/dL — ABNORMAL HIGH (ref 0.61–1.24)
GFR, Estimated: 58 mL/min — ABNORMAL LOW (ref >60)
Glucose, Bld: 98 mg/dL (ref 70–99)
Potassium: 3.2 mmol/L — ABNORMAL LOW (ref 3.5–5.1)
Sodium: 141 mmol/L (ref 135–145)

## 2021-04-14 LAB — TROPONIN I (HIGH SENSITIVITY)
Troponin I (High Sensitivity): 55 ng/L — ABNORMAL HIGH (ref <18)
Troponin I (High Sensitivity): 57 ng/L — ABNORMAL HIGH (ref <18)

## 2021-04-14 LAB — BRAIN NATRIURETIC PEPTIDE: B Natriuretic Peptide: 905.6 pg/mL — ABNORMAL HIGH (ref 0.0–100.0)

## 2021-04-14 NOTE — ED Provider Notes (Signed)
Emergency Medicine Provider Triage Evaluation Note  Francisco [name] , a 60 y.o. male  was evaluated in triage.  Pt complains of chest pain, shortness of breath, palpitations, and near syncope for the last 6 days.  Was seen on 12/21 for COPD exacerbation, completed prednisone today.  Has been using his nebulizers at home and taking his medications daily without improvement.  States that his shortness of breath woke him from sleep today.  Review of Systems  Positive: Chest pain, shortness of breath, chest tightness, cough productive with Zale formed Negative: Fevers, chills  Physical Exam  BP (!) 188/114 (BP Location: Right Arm)    Pulse (!) 123    Temp 98.7 F (37.1 C) (Oral)    Resp (!) 26    SpO2 96%  Gen:   Awake, no distress   Resp:  Tachypnea with respiratory rate in the 30s, increased work of breathing, decreased air movement throughout the lung fields bilaterally with diffuse wheezing MSK:   Moves extremities without difficulty  Other:  Tachycardia with regular rhythm no M/R/G.  Medical Decision Making  Medically screening exam initiated at 1:39 PM.  Appropriate orders placed.  Hadley Detloff was informed that the remainder of the evaluation will be completed by another provider, this initial triage assessment does not replace that evaluation, and the importance of remaining in the ED until their evaluation is complete.  Patient triage level increased to level 2.  Labs pending from initial triage intake.  This chart was dictated using voice recognition software, Dragon. Despite the best efforts of this provider to proofread and correct errors, errors may still occur which can change documentation meaning.    Sherrilee Gilles 04/14/21 1341    Pollyann Savoy, MD 04/14/21 1435

## 2021-04-14 NOTE — ED Notes (Signed)
Pt got up and left out front doors. LWBS

## 2021-04-14 NOTE — ED Triage Notes (Signed)
Pt with six days of shob, dizziness, and chest pain. Seen for same here on 12/21 and reports feeling better x 1 day and then symptoms have recurred and worsened. Hx of COPD and has taken his home breathing tx and nebulizer without improvement. Pain worse with deep breaths.

## 2021-04-14 NOTE — ED Notes (Signed)
Pt stated, "im fixing to leave"

## 2021-04-15 ENCOUNTER — Encounter (HOSPITAL_COMMUNITY): Payer: Self-pay | Admitting: *Deleted

## 2021-04-15 ENCOUNTER — Other Ambulatory Visit: Payer: Self-pay

## 2021-04-15 ENCOUNTER — Emergency Department (HOSPITAL_COMMUNITY)
Admission: EM | Admit: 2021-04-15 | Discharge: 2021-04-15 | Disposition: A | Discharge status: Home / Self Care | Payer: Self-pay | Attending: Emergency Medicine | Admitting: Emergency Medicine

## 2021-04-15 ENCOUNTER — Emergency Department (HOSPITAL_COMMUNITY): Payer: Self-pay

## 2021-04-15 DIAGNOSIS — F17210 Nicotine dependence, cigarettes, uncomplicated: Secondary | ICD-10-CM | POA: Insufficient documentation

## 2021-04-15 DIAGNOSIS — I2510 Atherosclerotic heart disease of native coronary artery without angina pectoris: Secondary | ICD-10-CM | POA: Insufficient documentation

## 2021-04-15 DIAGNOSIS — Z7982 Long term (current) use of aspirin: Secondary | ICD-10-CM | POA: Insufficient documentation

## 2021-04-15 DIAGNOSIS — Z79899 Other long term (current) drug therapy: Secondary | ICD-10-CM | POA: Insufficient documentation

## 2021-04-15 DIAGNOSIS — J449 Chronic obstructive pulmonary disease, unspecified: Secondary | ICD-10-CM | POA: Insufficient documentation

## 2021-04-15 DIAGNOSIS — R0602 Shortness of breath: Secondary | ICD-10-CM | POA: Insufficient documentation

## 2021-04-15 DIAGNOSIS — Z20822 Contact with and (suspected) exposure to covid-19: Secondary | ICD-10-CM | POA: Insufficient documentation

## 2021-04-15 DIAGNOSIS — I110 Hypertensive heart disease with heart failure: Secondary | ICD-10-CM | POA: Insufficient documentation

## 2021-04-15 DIAGNOSIS — D72829 Elevated white blood cell count, unspecified: Secondary | ICD-10-CM | POA: Insufficient documentation

## 2021-04-15 DIAGNOSIS — I5043 Acute on chronic combined systolic (congestive) and diastolic (congestive) heart failure: Secondary | ICD-10-CM | POA: Insufficient documentation

## 2021-04-15 DIAGNOSIS — R079 Chest pain, unspecified: Secondary | ICD-10-CM | POA: Insufficient documentation

## 2021-04-15 LAB — I-STAT VENOUS BLOOD GAS, ED
Acid-Base Excess: 7 mmol/L — ABNORMAL HIGH (ref 0.0–2.0)
Bicarbonate: 35.5 mmol/L — ABNORMAL HIGH (ref 20.0–28.0)
Calcium, Ion: 1.19 mmol/L (ref 1.15–1.40)
HCT: 51 % (ref 39.0–52.0)
Hemoglobin: 17.3 g/dL — ABNORMAL HIGH (ref 13.0–17.0)
O2 Saturation: 66 %
Potassium: 4 mmol/L (ref 3.5–5.1)
Sodium: 141 mmol/L (ref 135–145)
TCO2: 37 mmol/L — ABNORMAL HIGH (ref 22–32)
pCO2, Ven: 64.7 mmHg — ABNORMAL HIGH (ref 44.0–60.0)
pH, Ven: 7.348 (ref 7.250–7.430)
pO2, Ven: 37 mmHg (ref 32.0–45.0)

## 2021-04-15 LAB — BASIC METABOLIC PANEL
Anion gap: 10 (ref 5–15)
BUN: 15 mg/dL (ref 6–20)
CO2: 29 mmol/L (ref 22–32)
Calcium: 9.3 mg/dL (ref 8.9–10.3)
Chloride: 98 mmol/L (ref 98–111)
Creatinine, Ser: 1.15 mg/dL (ref 0.61–1.24)
GFR, Estimated: >60 mL/min (ref >60)
Glucose, Bld: 99 mg/dL (ref 70–99)
Potassium: 3.3 mmol/L — ABNORMAL LOW (ref 3.5–5.1)
Sodium: 137 mmol/L (ref 135–145)

## 2021-04-15 LAB — CBC
HCT: 49.3 % (ref 39.0–52.0)
Hemoglobin: 16.2 g/dL (ref 13.0–17.0)
MCH: 32.2 pg (ref 26.0–34.0)
MCHC: 32.9 g/dL (ref 30.0–36.0)
MCV: 98 fL (ref 80.0–100.0)
Platelets: 390 10*3/uL (ref 150–400)
RBC: 5.03 MIL/uL (ref 4.22–5.81)
RDW: 13.3 % (ref 11.5–15.5)
WBC: 13.2 10*3/uL — ABNORMAL HIGH (ref 4.0–10.5)
nRBC: 0 % (ref 0.0–0.2)

## 2021-04-15 LAB — MAGNESIUM: Magnesium: 2.2 mg/dL (ref 1.7–2.4)

## 2021-04-15 LAB — RESP PANEL BY RT-PCR (FLU A&B, COVID) ARPGX2
Influenza A by PCR: NEGATIVE
Influenza B by PCR: NEGATIVE
SARS Coronavirus 2 by RT PCR: NEGATIVE

## 2021-04-15 LAB — CBG MONITORING, ED: Glucose-Capillary: 91 mg/dL (ref 70–99)

## 2021-04-15 LAB — TROPONIN I (HIGH SENSITIVITY)
Troponin I (High Sensitivity): 58 ng/L — ABNORMAL HIGH (ref <18)
Troponin I (High Sensitivity): 57 ng/L — ABNORMAL HIGH (ref <18)

## 2021-04-15 LAB — BRAIN NATRIURETIC PEPTIDE: B Natriuretic Peptide: 635.3 pg/mL — ABNORMAL HIGH (ref 0.0–100.0)

## 2021-04-15 MED ORDER — MAGNESIUM SULFATE 2 GM/50ML IV SOLN
2.0000 g | Freq: Once | INTRAVENOUS | Status: AC
  Administered 2021-04-15: 13:00:00 2 g via INTRAVENOUS
  Filled 2021-04-15: qty 50

## 2021-04-15 MED ORDER — ALBUTEROL SULFATE (2.5 MG/3ML) 0.083% IN NEBU: 2.5000 mg | INHALATION_SOLUTION | Freq: Four times a day (QID) | RESPIRATORY_TRACT | 0 refills | Status: DC | PRN

## 2021-04-15 MED ORDER — DOXYCYCLINE HYCLATE 100 MG PO CAPS: 100.0000 mg | ORAL_CAPSULE | Freq: Two times a day (BID) | ORAL | 0 refills | Status: DC

## 2021-04-15 MED ORDER — METHYLPREDNISOLONE SODIUM SUCC 125 MG IJ SOLR
125.0000 mg | Freq: Once | INTRAMUSCULAR | Status: AC
  Administered 2021-04-15: 12:00:00 125 mg via INTRAVENOUS
  Filled 2021-04-15: qty 2

## 2021-04-15 MED ORDER — ALBUTEROL SULFATE (2.5 MG/3ML) 0.083% IN NEBU
5.0000 mg/h | INHALATION_SOLUTION | Freq: Once | RESPIRATORY_TRACT | Status: AC
  Administered 2021-04-15: 13:00:00 5 mg/h via RESPIRATORY_TRACT
  Filled 2021-04-15: qty 6

## 2021-04-15 MED ORDER — IPRATROPIUM-ALBUTEROL 0.5-2.5 (3) MG/3ML IN SOLN
3.0000 mL | Freq: Once | RESPIRATORY_TRACT | Status: AC
  Administered 2021-04-15: 12:00:00 3 mL via RESPIRATORY_TRACT
  Filled 2021-04-15: qty 3

## 2021-04-15 MED ORDER — PREDNISONE 20 MG PO TABS: 60.0000 mg | ORAL_TABLET | Freq: Once | ORAL | Status: DC

## 2021-04-15 MED ORDER — PREDNISONE 10 MG PO TABS: 30.0000 mg | ORAL_TABLET | Freq: Every day | ORAL | 0 refills | Status: DC

## 2021-04-15 NOTE — Discharge Instructions (Signed)
Likely you have a COPD exacerbation.  I have given you steroids, inhaler, and nebulizer.  Use the inhaler as needed, you may use the nebulizer when the inhaler is not working.  This medication will increase your heart rate please refrain from using that inhaler and/or nebulizer treatment more than every 4 to 6 hours.  I also started on antibiotics please take as prescribed.  I recommend staying in warm moist air as this will help with your breathing, please refrain from smoking as this will only further worsen your shortness of breath.  Please follow-up with your PCP for further evaluation.  Come back to the emergency department if you develop chest pain, shortness of breath, severe abdominal pain, uncontrolled nausea, vomiting, diarrhea.

## 2021-04-15 NOTE — ED Triage Notes (Signed)
To ED for eval of increased sob and cp. Hx of copd and CHF. Pt states he is taking meds as prescribed. Came to ED yesterday but left prior to eval completed. Pt states he does smoke 3 cigarettes a day. Speech is clear but broken with a full sentence. Unknown weight gain.

## 2021-04-15 NOTE — ED Provider Notes (Cosign Needed)
Emergency Medicine Provider Triage Evaluation Note  Francisco [name] , a 60 y.o. male  was evaluated in triage.  Pt complains of chest tightness, SOB fatigue and coughing. States has been ongoing for 1 month but much worse over the past few days.  No nausea, vomiting.   Working hard to breathe he states.   Review of Systems  Positive: Cough, sob, chest tight Negative: Fever   Physical Exam  BP (!) 205/128 (BP Location: Right Arm)    Pulse (!) 129    Temp 98.6 F (37 C) (Oral)    Resp (!) 22    SpO2 90%  Gen:   Awake, no distress   Resp:  Very tight, low tidal volume. + WHEEZING MSK:   Moves extremities without difficulty  Other:  NO pitting edema or unilat leg swelling  Medical Decision Making  Medically screening exam initiated at 10:14 AM.  Appropriate orders placed.  Carlson Belland was informed that the remainder of the evaluation will be completed by another provider, this initial triage assessment does not replace that evaluation, and the importance of remaining in the ED until their evaluation is complete.  Will discuss w charge for room   Gailen Shelter, [place] 04/15/21 1020

## 2021-04-15 NOTE — ED Provider Notes (Signed)
[facility] EMERGENCY DEPARTMENT Provider Note   CSN: [id number] Arrival date & time: 04/15/21  [id number]     History Chief Complaint  Patient presents with   Shortness of Breath   Chest Pain    Francisco [name] is a 60 y.o. male.  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  Patient states this has been going on for last 10 days, states has been getting worse.  He went to the emergency department few days ago and started on bronchodilators and steroids but he states he obtained this without much relief.  He denies any headaches, fevers or chills but endorses slight nasal congestion a productive cough and chest tightness, denies stomach pains, nausea, vomiting, diarrhea, general body aches.  He denies any chest pain, pleuritic chest pain, states that shortness of breath is because it he feels tight.  He denies worsening leg swelling, orthopnea, he was out of his fluid pills but was given a refill a few days agoand take this constantly, is urinating without difficulty.  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  He has no other complaints at this time.  Past Medical History:  Diagnosis Date   CHF (congestive heart failure) (HCC)    COPD (chronic obstructive pulmonary disease) (HCC)    Coronary artery disease    a. s/p prior PCI.   Hepatitis-C    Hypertension    IV drug abuse ([place])    a. 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   Medically noncompliant    MI (myocardial infarction) ([place])    Tobacco abuse     Patient Active Problem List   Diagnosis Date Noted   Severe sepsis ([place]) 01/16/2021   Hypomagnesemia 01/16/2021   Tobacco abuse 01/16/2021   COPD exacerbation ([place]) 01/16/2021   CAP (community acquired pneumonia) 01/15/2021    Polysubstance abuse ([place]) 08/10/2020   Acute on chronic heart failure with preserved ejection fraction (HFpEF) ([place]) 08/09/2020   NSTEMI (non-ST elevated myocardial infarction) ([place]) 08/09/2020   Prolonged QT interval 08/09/2020   CHF, acute on chronic ([place]) 08/08/2020   SOB (shortness of breath)    Hypertensive urgency    Acute on chronic respiratory failure with hypoxia and hypercapnia ([place]) 01/10/2020   COPD with acute exacerbation ([place]) 01/09/2020   Hypotension 08/18/2019   Acute on chronic combined systolic and diastolic CHF (congestive heart failure) ([place]) 08/17/2019   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   Chronic obstructive pulmonary disease (HCC)    Elevated troponin    Unstable angina (HCC)    Dilated cardiomyopathy (HCC)    CAD S/P percutaneous coronary angioplasty    ACS (acute coronary syndrome) ([place]) 04/29/2019   Acute respiratory failure with hypoxia ([place]) 06/17/2018    Past Surgical History:  Procedure Laterality Date   BACK SURGERY     NECK SURGERY     RIGHT/LEFT HEART CATH AND CORONARY ANGIOGRAPHY N/A 04/30/2019   Procedure: RIGHT/LEFT HEART CATH AND CORONARY ANGIOGRAPHY;  Surgeon: Leonie Man, MD;  Location: [facility] [facility];  Service: Cardiovascular;  Laterality: N/A;   stints         Family History  Problem Relation Age of Onset   Rheum arthritis Mother     Social History   Tobacco Use   Smoking status:  Every Day    Packs/day: 0.75    Types: Cigarettes   Smokeless tobacco: Never  Vaping Use   Vaping Use: Never used  Substance Use Topics   Alcohol use: Never   Drug use: Yes    Types: Methamphetamines    Comment: heroin    Home Medications Prior to Admission medications   Medication Sig Start Date End Date Taking? Authorizing Provider  albuterol (PROVENTIL) (2.5 MG/3ML) 0.083% nebulizer solution Take 3 mLs (2.5 mg total) by nebulization every 6 (six) hours as needed for up to 5 days for wheezing or shortness of breath. 04/15/21  04/20/21 Yes Carroll Sage, PA-C  aspirin EC 81 MG tablet Take 1 tablet (81 mg total) by mouth daily. 04/08/21 05/08/21 Yes Hong, Eustace Moore, MD  doxycycline (VIBRAMYCIN) 100 MG capsule Take 1 capsule (100 mg total) by mouth 2 (two) times daily for 7 days. 04/15/21 04/22/21 Yes Carroll Sage, PA-C  furosemide (LASIX) 40 MG tablet Take 1 tablet (40 mg total) by mouth 2 (two) times daily. 04/08/21 05/08/21 Yes Cheryll Cockayne, MD  lisinopril (ZESTRIL) 40 MG tablet TAKE 1 TABLET (40 MG TOTAL) BY MOUTH DAILY. Patient taking differently: Take 40 mg by mouth in the morning. 12/17/20 12/17/21 Yes Cardama, Amadeo Garnet, MD  metoprolol succinate (TOPROL-XL) 25 MG 24 hr tablet Take 1 tablet (25 mg total) by mouth in the morning. 04/08/21 05/08/21 Yes Hong, Eustace Moore, MD  nitroGLYCERIN (NITROSTAT) 0.4 MG SL tablet Place 1 tablet (0.4 mg total) under the tongue every 5 (five) minutes x 3 doses as needed for chest pain. 08/10/20  Yes Alessandra Bevels, MD  predniSONE (DELTASONE) 10 MG tablet Take 3 tablets (30 mg total) by mouth daily for 5 days. 04/15/21 04/20/21 Yes Carroll Sage, PA-C  atorvastatin (LIPITOR) 40 MG tablet Take 1 tablet (40 mg total) by mouth daily. Patient not taking: Reported on 01/15/2021 08/11/20   Alessandra Bevels, MD  atorvastatin (LIPITOR) 80 MG tablet TAKE 1 TABLET (80 MG TOTAL) BY MOUTH DAILY AT 6 PM. Patient not taking: Reported on 01/15/2021 05/06/20 05/06/21  Lorelee New, PA-C  atorvastatin (LIPITOR) 80 MG tablet TAKE 1 TABLET (80 MG TOTAL) BY MOUTH DAILY AT 6 PM. Patient not taking: Reported on 01/15/2021 01/17/20 01/16/21  Anders Simmonds, PA-C  azithromycin (ZITHROMAX) 250 MG tablet TAKE 2 TABLETS BY MOUTH ON DAY, TAKE 1 TABLET ON DAYS 2 THROUGH 5. Patient not taking: Reported on 01/15/2021 05/06/20 05/06/21  Lorelee New, PA-C  carvedilol (COREG) 25 MG tablet TAKE 1 TABLET (25 MG TOTAL) BY MOUTH 2 (TWO) TIMES DAILY WITH A MEAL. Patient not taking: Reported on 01/15/2021  10/14/20 10/14/21  Geoffery Lyons, MD  potassium chloride (KLOR-CON M) 10 MEQ tablet Take 1 tablet (10 mEq total) by mouth daily. Patient not taking: Reported on 04/15/2021 04/08/21 05/08/21  Cheryll Cockayne, MD    Allergies    Ivp dye [iodinated contrast media] and Tramadol  Review of Systems   Review of Systems  Constitutional:  Negative for chills and fever.  HENT:  Positive for congestion. Negative for sore throat.   Respiratory:  Positive for cough, chest tightness and shortness of breath.   Cardiovascular:  Negative for chest pain.  Gastrointestinal:  Negative for abdominal pain, diarrhea, nausea and vomiting.  Genitourinary:  Negative for enuresis.  Musculoskeletal:  Negative for myalgias.  Skin:  Negative for rash.  Neurological:  Negative for headaches.  Hematological:  Does not bruise/bleed easily.   Physical Exam  Updated Vital Signs BP (!) 159/88    Pulse (!) 110    Temp 98 F (36.7 C)    Resp (!) 21    SpO2 90%   Physical Exam Vitals and nursing note reviewed.  Constitutional:      General: He is not in acute distress.    Appearance: He is not ill-appearing.  HENT:     Head: Normocephalic and atraumatic.     Right Ear: Tympanic membrane, ear canal and external ear normal.     Left Ear: Tympanic membrane, ear canal and external ear normal.     Nose: Congestion present.     Mouth/Throat:     Mouth: Mucous membranes are moist.     Pharynx: Oropharynx is clear. No oropharyngeal exudate or posterior oropharyngeal erythema.  Eyes:     Conjunctiva/sclera: Conjunctivae normal.  Cardiovascular:     Rate and Rhythm: Normal rate and regular rhythm.     Pulses: Normal pulses.     Heart sounds: No murmur heard.   No friction rub. No gallop.  Pulmonary:     Breath sounds: No stridor. Wheezing present. No rales.     Comments: Patient not appear in acute respiratory distress, able speak in full sentences, he was tachypneic, but nonhypoxic, in the mid 90s while on room air,  patient had a tight sounding chest with intermittent external wheezing heard in the lower lobes, no stridor, rhonchi or rales present. Chest:     Chest wall: No tenderness.  Abdominal:     Palpations: Abdomen is soft.     Tenderness: There is no abdominal tenderness. There is no right CVA tenderness or left CVA tenderness.  Musculoskeletal:     Right lower leg: No edema.     Left lower leg: No edema.  Skin:    General: Skin is warm and dry.  Neurological:     Mental Status: He is alert.  Psychiatric:        Mood and Affect: Mood normal.    ED Results / Procedures / Treatments   Labs (all labs ordered are listed, but only abnormal results are displayed) Labs Reviewed  BASIC METABOLIC PANEL - Abnormal; Notable for the following components:      Result Value   Potassium 3.3 (*)    All other components within normal limits  CBC - Abnormal; Notable for the following components:   WBC 13.2 (*)    All other components within normal limits  BRAIN NATRIURETIC PEPTIDE - Abnormal; Notable for the following components:   B Natriuretic Peptide 635.3 (*)    All other components within normal limits  I-STAT VENOUS BLOOD GAS, ED - Abnormal; Notable for the following components:   pCO2, Ven 64.7 (*)    Bicarbonate 35.5 (*)    TCO2 37 (*)    Acid-Base Excess 7.0 (*)    Hemoglobin 17.3 (*)    All other components within normal limits  TROPONIN I (HIGH SENSITIVITY) - Abnormal; Notable for the following components:   Troponin I (High Sensitivity) 58 (*)    All other components within normal limits  TROPONIN I (HIGH SENSITIVITY) - Abnormal; Notable for the following components:   Troponin I (High Sensitivity) 57 (*)    All other components within normal limits  RESP PANEL BY RT-PCR (FLU A&B, COVID) ARPGX2  MAGNESIUM  BLOOD GAS, VENOUS  CBG MONITORING, ED    EKG EKG Interpretation  Date/Time:  Wednesday April 15 2021 11:46:46 EST Ventricular Rate:  113 PR  Interval:  137 QRS  Duration: 94 QT Interval:  309 QTC Calculation: 424 R Axis:   66 Text Interpretation: Sinus tachycardia Atrial premature complex Right atrial enlargement Borderline repolarization abnormality ST depressions in V5 and V6 Confirmed by Ernie Avena ([id number]) on 04/15/2021 12:29:22 PM  Radiology DG Chest 2 View  Result Date: 04/15/2021 CLINICAL DATA:  Shortness of breath, chest pain EXAM: CHEST - 2 VIEW COMPARISON:  04/14/2021 FINDINGS: The heart size and mediastinal contours are within normal limits. Both lungs are clear. The visualized skeletal structures are unremarkable. IMPRESSION: No active cardiopulmonary disease. Electronically Signed   By: Elige Ko M.D.   On: 04/15/2021 10:41   DG Chest 2 View  Result Date: 04/14/2021 CLINICAL DATA:  Shortness of breath, cough EXAM: CHEST - 2 VIEW COMPARISON:  04/08/2021 FINDINGS: Transverse diameter of heart is increased. There are no signs of pulmonary edema or focal pulmonary consolidation. There is no pleural effusion or pneumothorax. IMPRESSION: Cardiomegaly. There are no signs of pulmonary edema or focal pulmonary consolidation. Electronically Signed   By: Ernie Avena M.D.   On: 04/14/2021 12:38    Procedures Procedures   Medications Ordered in ED Medications  methylPREDNISolone sodium succinate (SOLU-MEDROL) 125 mg/2 mL injection 125 mg (125 mg Intravenous Given 04/15/21 1201)  ipratropium-albuterol (DUONEB) 0.5-2.5 (3) MG/3ML nebulizer solution 3 mL (3 mLs Nebulization Given 04/15/21 1202)  albuterol (PROVENTIL) (2.5 MG/3ML) 0.083% nebulizer solution (5 mg/hr Nebulization Given 04/15/21 1244)  magnesium sulfate IVPB 2 g 50 mL (0 g Intravenous Stopped 04/15/21 1415)    ED Course  I have reviewed the triage vital signs and the nursing notes.  Pertinent labs & imaging results that were available during my care of the patient were reviewed by me and considered in my medical decision making (see chart for details).    MDM  Rules/Calculators/A&P                         Initial impression-presents with shortness of breath, alert, no acute stress, vital signs noted for tachycardia and tachypnea likely patient suffering from acute COPD exacerbation, triage obtain basic lab work-up, will add on magnesium, respiratory panel provide with a DuoNeb as well as steroids and reassess.  Work-up-CBC shows leukocytosis of 13.2, BMP shows potassium 3.3, troponin is 58, second troponin is 57 BNP is 635, VBG shows CO2 of 64, bicarb of 35, pH within normal limits.  Magnesium 2.2, respiratory panel negative.  Chest ray is unremarkable, EKG sinus tach without signs of ischemia   Reassessment-patient is reassessed after initial DuoNeb, states he is feeling better, still remains tachycardic as well as tachypneic, lung sounds have improved but he still very tight sounding, will provide with a continuous neb as well as started on magnesium.  Will reassess afterwards.  Patient is reassessed resting comfortably, able to speak in full sentences, vital signs show tachycardia as well as tachypnea, O2sats in the 90s while on room air, lung sounds reassessed still tight sounding with intermittent wheezing.  At this point I recommend admission for COPD exacerbation but the patient is deferring rather go home and states that he will come back if symptoms worsen.     We discussed the nature and purpose, risks and benefits, as well as, the alternatives of treatment. Time was given to allow the opportunity to ask questions and consider their options, and after the discussion, the patient decided to refuse the offerred treatment. The patient was informed that refusal could lead  to, but was not limited to, death, permanent disability, or severe pain. If present, I asked the relatives or significant others to dissuade them without success. Prior to refusing, I determined that the patient had the capacity to make their decision and understood the consequences of  that decision. After refusal, I made every reasonable opportunity to treat them to the best of my ability.  The patient was notified that they may return to the emergency department at any time for further treatment.    Rule out- I have low suspicion for ACS as history is atypical, patient has no cardiac history, EKG was sinus rhythm without signs of ischemia, patient has a downtrending troponin.  It is noted to be elevated but this appears to be his baseline, this is likely secondary due to his chronic CHF.  Low suspicion for PE as patient denies pleuritic chest pain,  patient denies leg pain, no pedal edema noted on exam. patient is tachypneic and tachycardic but patient states that he took multiple nebulizing treatments prior to arrival which I suspect is cause of his tachycardia. I have low suspicion for PE as presentation more consistent with COPD i.e. increased sputum production, coughing, tight sounding chest.  I have low suspicion for CHF exacerbation as no peripheral edema, no bibasilar Rales, no pleural effusion seen on chest x-ray, of note patient's BNP is downtrending from a few days ago.  Low suspicion for AAA or aortic dissection as history is atypical, patient has low risk factors.  Low suspicion for systemic infection as patient is nontoxic-appearing, vital signs reassuring, no obvious source infection noted on exam.   Plan-  CV exacerbation-patient is leaving AMA, will provide him with additional albuterol and inhalers, steroids as well as antibiotics.  Given strict return precautions.        Final Clinical Impression(s) / ED Diagnoses Final diagnoses:  Shortness of breath    Rx / DC Orders ED Discharge Orders          Ordered    predniSONE (DELTASONE) 10 MG tablet  Daily        04/15/21 1354    albuterol (PROVENTIL) (2.5 MG/3ML) 0.083% nebulizer solution  Every 6 hours PRN        04/15/21 1354    doxycycline (VIBRAMYCIN) 100 MG capsule  2 times daily        04/15/21  1354             Aron Baba 04/15/21 1515    Regan Lemming, MD 04/15/21 2117

## 2021-04-19 ENCOUNTER — Other Ambulatory Visit: Payer: Self-pay

## 2021-04-19 ENCOUNTER — Inpatient Hospital Stay (HOSPITAL_COMMUNITY)
Admission: EM | Admit: 2021-04-19 | Discharge: 2021-04-21 | DRG: 190 | Disposition: A | Discharge status: Home / Self Care | Payer: Self-pay | Attending: Internal Medicine | Admitting: Internal Medicine

## 2021-04-19 ENCOUNTER — Emergency Department (HOSPITAL_COMMUNITY): Payer: Self-pay

## 2021-04-19 ENCOUNTER — Encounter (HOSPITAL_COMMUNITY): Payer: Self-pay | Admitting: Emergency Medicine

## 2021-04-19 DIAGNOSIS — Z720 Tobacco use: Secondary | ICD-10-CM | POA: Diagnosis present

## 2021-04-19 DIAGNOSIS — F17210 Nicotine dependence, cigarettes, uncomplicated: Secondary | ICD-10-CM | POA: Diagnosis present

## 2021-04-19 DIAGNOSIS — I739 Peripheral vascular disease, unspecified: Secondary | ICD-10-CM | POA: Diagnosis present

## 2021-04-19 DIAGNOSIS — I5043 Acute on chronic combined systolic (congestive) and diastolic (congestive) heart failure: Secondary | ICD-10-CM | POA: Diagnosis present

## 2021-04-19 DIAGNOSIS — Z79899 Other long term (current) drug therapy: Secondary | ICD-10-CM

## 2021-04-19 DIAGNOSIS — J441 Chronic obstructive pulmonary disease with (acute) exacerbation: Principal | ICD-10-CM | POA: Diagnosis present

## 2021-04-19 DIAGNOSIS — I2510 Atherosclerotic heart disease of native coronary artery without angina pectoris: Secondary | ICD-10-CM | POA: Diagnosis present

## 2021-04-19 DIAGNOSIS — Z955 Presence of coronary angioplasty implant and graft: Secondary | ICD-10-CM

## 2021-04-19 DIAGNOSIS — Z20822 Contact with and (suspected) exposure to covid-19: Secondary | ICD-10-CM | POA: Diagnosis present

## 2021-04-19 DIAGNOSIS — I5022 Chronic systolic (congestive) heart failure: Secondary | ICD-10-CM | POA: Insufficient documentation

## 2021-04-19 DIAGNOSIS — N179 Acute kidney failure, unspecified: Secondary | ICD-10-CM | POA: Diagnosis present

## 2021-04-19 DIAGNOSIS — I420 Dilated cardiomyopathy: Secondary | ICD-10-CM | POA: Diagnosis present

## 2021-04-19 DIAGNOSIS — Z91041 Radiographic dye allergy status: Secondary | ICD-10-CM

## 2021-04-19 DIAGNOSIS — Z7982 Long term (current) use of aspirin: Secondary | ICD-10-CM

## 2021-04-19 DIAGNOSIS — F1510 Other stimulant abuse, uncomplicated: Secondary | ICD-10-CM | POA: Diagnosis present

## 2021-04-19 DIAGNOSIS — I252 Old myocardial infarction: Secondary | ICD-10-CM

## 2021-04-19 DIAGNOSIS — B182 Chronic viral hepatitis C: Secondary | ICD-10-CM | POA: Diagnosis present

## 2021-04-19 DIAGNOSIS — Z8249 Family history of ischemic heart disease and other diseases of the circulatory system: Secondary | ICD-10-CM

## 2021-04-19 DIAGNOSIS — Z885 Allergy status to narcotic agent status: Secondary | ICD-10-CM

## 2021-04-19 DIAGNOSIS — I110 Hypertensive heart disease with heart failure: Secondary | ICD-10-CM | POA: Diagnosis present

## 2021-04-19 DIAGNOSIS — E785 Hyperlipidemia, unspecified: Secondary | ICD-10-CM | POA: Diagnosis present

## 2021-04-19 LAB — I-STAT VENOUS BLOOD GAS, ED
Acid-Base Excess: 11 mmol/L — ABNORMAL HIGH (ref 0.0–2.0)
Bicarbonate: 37.7 mmol/L — ABNORMAL HIGH (ref 20.0–28.0)
Calcium, Ion: 1.09 mmol/L — ABNORMAL LOW (ref 1.15–1.40)
HCT: 53 % — ABNORMAL HIGH (ref 39.0–52.0)
Hemoglobin: 18 g/dL — ABNORMAL HIGH (ref 13.0–17.0)
O2 Saturation: 52 %
Potassium: 4.5 mmol/L (ref 3.5–5.1)
Sodium: 140 mmol/L (ref 135–145)
TCO2: 39 mmol/L — ABNORMAL HIGH (ref 22–32)
pCO2, Ven: 53.9 mmHg (ref 44.0–60.0)
pH, Ven: 7.453 — ABNORMAL HIGH (ref 7.250–7.430)
pO2, Ven: 27 mmHg — CL (ref 32.0–45.0)

## 2021-04-19 LAB — CBC WITH DIFFERENTIAL/PLATELET
Abs Immature Granulocytes: 0.09 K/uL — ABNORMAL HIGH (ref 0.00–0.07)
Basophils Absolute: 0.1 K/uL (ref 0.0–0.1)
Basophils Relative: 1 %
Eosinophils Absolute: 0.2 K/uL (ref 0.0–0.5)
Eosinophils Relative: 2 %
HCT: 56.5 % — ABNORMAL HIGH (ref 39.0–52.0)
Hemoglobin: 17.6 g/dL — ABNORMAL HIGH (ref 13.0–17.0)
Immature Granulocytes: 1 %
Lymphocytes Relative: 22 %
Lymphs Abs: 2.8 K/uL (ref 0.7–4.0)
MCH: 31.4 pg (ref 26.0–34.0)
MCHC: 31.2 g/dL (ref 30.0–36.0)
MCV: 100.7 fL — ABNORMAL HIGH (ref 80.0–100.0)
Monocytes Absolute: 1.5 K/uL — ABNORMAL HIGH (ref 0.1–1.0)
Monocytes Relative: 12 %
Neutro Abs: 8.2 K/uL — ABNORMAL HIGH (ref 1.7–7.7)
Neutrophils Relative %: 62 %
Platelets: 346 K/uL (ref 150–400)
RBC: 5.61 MIL/uL (ref 4.22–5.81)
RDW: 13.1 % (ref 11.5–15.5)
WBC: 13 K/uL — ABNORMAL HIGH (ref 4.0–10.5)
nRBC: 0 % (ref 0.0–0.2)

## 2021-04-19 LAB — RESP PANEL BY RT-PCR (FLU A&B, COVID) ARPGX2
Influenza A by PCR: NEGATIVE
Influenza B by PCR: NEGATIVE
SARS Coronavirus 2 by RT PCR: NEGATIVE

## 2021-04-19 LAB — HIV ANTIBODY (ROUTINE TESTING W REFLEX): HIV Screen 4th Generation wRfx: NONREACTIVE

## 2021-04-19 LAB — BRAIN NATRIURETIC PEPTIDE: B Natriuretic Peptide: 687.9 pg/mL — ABNORMAL HIGH (ref 0.0–100.0)

## 2021-04-19 LAB — D-DIMER, QUANTITATIVE: D-Dimer, Quant: 0.31 ug/mL-FEU (ref 0.00–0.50)

## 2021-04-19 LAB — BASIC METABOLIC PANEL WITH GFR
Anion gap: 11 (ref 5–15)
BUN: 13 mg/dL (ref 6–20)
CO2: 32 mmol/L (ref 22–32)
Calcium: 10 mg/dL (ref 8.9–10.3)
Chloride: 97 mmol/L — ABNORMAL LOW (ref 98–111)
Creatinine, Ser: 1.1 mg/dL (ref 0.61–1.24)
GFR, Estimated: >60 mL/min (ref >60)
Glucose, Bld: 102 mg/dL — ABNORMAL HIGH (ref 70–99)
Potassium: 4 mmol/L (ref 3.5–5.1)
Sodium: 140 mmol/L (ref 135–145)

## 2021-04-19 LAB — TROPONIN I (HIGH SENSITIVITY)
Troponin I (High Sensitivity): 65 ng/L — ABNORMAL HIGH (ref <18)
Troponin I (High Sensitivity): 65 ng/L — ABNORMAL HIGH (ref <18)

## 2021-04-19 MED ORDER — NICOTINE 14 MG/24HR TD PT24: 14.0000 mg | MEDICATED_PATCH | Freq: Every day | TRANSDERMAL | Status: DC | PRN

## 2021-04-19 MED ORDER — UMECLIDINIUM BROMIDE 62.5 MCG/ACT IN AEPB
1.0000 | INHALATION_SPRAY | Freq: Every day | RESPIRATORY_TRACT | Status: DC
  Administered 2021-04-20 – 2021-04-21 (×2): 1 via RESPIRATORY_TRACT
  Filled 2021-04-19 (×2): qty 7

## 2021-04-19 MED ORDER — IPRATROPIUM-ALBUTEROL 0.5-2.5 (3) MG/3ML IN SOLN
3.0000 mL | Freq: Four times a day (QID) | RESPIRATORY_TRACT | Status: DC | PRN
  Administered 2021-04-19: 3 mL via RESPIRATORY_TRACT
  Filled 2021-04-19: qty 3

## 2021-04-19 MED ORDER — ALBUTEROL SULFATE (2.5 MG/3ML) 0.083% IN NEBU
2.5000 mg | INHALATION_SOLUTION | Freq: Once | RESPIRATORY_TRACT | Status: AC
  Administered 2021-04-19: 2.5 mg via RESPIRATORY_TRACT
  Filled 2021-04-19: qty 3

## 2021-04-19 MED ORDER — ALBUTEROL SULFATE (2.5 MG/3ML) 0.083% IN NEBU
12.0000 mL | INHALATION_SOLUTION | RESPIRATORY_TRACT | Status: DC
  Administered 2021-04-19: 12 mL via RESPIRATORY_TRACT
  Filled 2021-04-19: qty 3

## 2021-04-19 MED ORDER — ASPIRIN EC 81 MG PO TBEC
81.0000 mg | DELAYED_RELEASE_TABLET | Freq: Every day | ORAL | Status: DC
  Administered 2021-04-20 – 2021-04-21 (×2): 81 mg via ORAL
  Filled 2021-04-19 (×2): qty 1

## 2021-04-19 MED ORDER — ENOXAPARIN SODIUM 40 MG/0.4ML IJ SOSY
40.0000 mg | PREFILLED_SYRINGE | INTRAMUSCULAR | Status: DC
  Administered 2021-04-19 – 2021-04-20 (×2): 40 mg via SUBCUTANEOUS
  Filled 2021-04-19 (×2): qty 0.4

## 2021-04-19 MED ORDER — METOPROLOL SUCCINATE ER 25 MG PO TB24: 25.0000 mg | ORAL_TABLET | Freq: Every morning | ORAL | Status: DC

## 2021-04-19 MED ORDER — LISINOPRIL 20 MG PO TABS: 40.0000 mg | ORAL_TABLET | Freq: Every day | ORAL | Status: DC

## 2021-04-19 MED ORDER — METHYLPREDNISOLONE SODIUM SUCC 125 MG IJ SOLR
125.0000 mg | Freq: Once | INTRAMUSCULAR | Status: AC
  Administered 2021-04-19: 125 mg via INTRAVENOUS
  Filled 2021-04-19: qty 2

## 2021-04-19 MED ORDER — PREDNISONE 20 MG PO TABS
40.0000 mg | ORAL_TABLET | Freq: Every day | ORAL | Status: DC
  Administered 2021-04-20 – 2021-04-21 (×2): 40 mg via ORAL
  Filled 2021-04-19 (×2): qty 2

## 2021-04-19 MED ORDER — MAGNESIUM SULFATE 2 GM/50ML IV SOLN
2.0000 g | Freq: Once | INTRAVENOUS | Status: AC
  Administered 2021-04-20: 2 g via INTRAVENOUS
  Filled 2021-04-19: qty 50

## 2021-04-19 MED ORDER — IPRATROPIUM BROMIDE 0.02 % IN SOLN
0.5000 mg | Freq: Once | RESPIRATORY_TRACT | Status: AC
  Administered 2021-04-19: 0.5 mg via RESPIRATORY_TRACT
  Filled 2021-04-19: qty 2.5

## 2021-04-19 MED ORDER — AZITHROMYCIN 250 MG PO TABS
500.0000 mg | ORAL_TABLET | Freq: Every day | ORAL | Status: AC
  Administered 2021-04-19 – 2021-04-21 (×3): 500 mg via ORAL
  Filled 2021-04-19 (×3): qty 2

## 2021-04-19 MED ORDER — SODIUM CHLORIDE 0.9 % IV SOLN
1.0000 g | INTRAVENOUS | Status: DC
  Administered 2021-04-19: 1 g via INTRAVENOUS
  Filled 2021-04-19: qty 10

## 2021-04-19 MED ORDER — ATORVASTATIN CALCIUM 40 MG PO TABS
40.0000 mg | ORAL_TABLET | Freq: Every day | ORAL | Status: DC
  Administered 2021-04-19 – 2021-04-21 (×3): 40 mg via ORAL
  Filled 2021-04-19 (×3): qty 1

## 2021-04-19 MED ORDER — IPRATROPIUM-ALBUTEROL 0.5-2.5 (3) MG/3ML IN SOLN
3.0000 mL | Freq: Once | RESPIRATORY_TRACT | Status: AC
  Administered 2021-04-19: 3 mL via RESPIRATORY_TRACT
  Filled 2021-04-19: qty 3

## 2021-04-19 NOTE — ED Triage Notes (Addendum)
Hx of COPD.  C/o SOB and pain to center of chest x 1 week.  States his upper legs are also turning purple.  Taking his lasix.  Requested urinal on arrival.

## 2021-04-19 NOTE — Progress Notes (Signed)
Paged by RN at 11 pm that patient complaining of [facility] and chest pain. Evaluated patient at bedside. Pt complaining of SHOB, labored breathing, chest pressure but no chest pain or any radiating pain. Nurse started patient on 3L supplemental O2. Trops flat at 65 from earlier today and EKG without any acute ST or T wave changes compared to prior today. Vitals stable compared to admission. Exam largely unchanged with some mild wheezing in bilateral lung fields; no crackles or LE edema. Given pt last received duoneb tx about 9 hours prior, started Duonebs and gave mag sulfate. Pt reports some improvement in sxs with tx. Will CTM.

## 2021-04-19 NOTE — ED Notes (Signed)
MD patel at bedside

## 2021-04-19 NOTE — ED Notes (Signed)
MD Allena Katz returned this RN page - PRN neb to be administered and MD Allena Katz to come see patient

## 2021-04-19 NOTE — ED Notes (Signed)
Admitting doctor at the bedside 

## 2021-04-19 NOTE — ED Provider Notes (Signed)
[facility] EMERGENCY DEPARTMENT Provider Note   CSN: [id number] Arrival date & time: 04/19/21  1053     History  Chief Complaint  Patient presents with   Chest Pain   Shortness of Breath    Francisco [name] is a 61 y.o. male presenting for evaluation of chest pain and shortness of breath.  Patient states that the past 10 days, he has had increasing difficulty breathing and had increasing chest pain.  He has been seen in the ER several times for this, most recently 4 days ago.  He frequently improves with breathing treatments, but worsens at home despite steroids and albuterol.  He reports chest pain which is also worsening, centralized and constant.  Not worse with inspiration.  No fevers, nausea, vomiting, abdominal pain, leg pain or swelling.  Additionally, patient states he has had blue discoloration of bilateral knees and his hands being cold and darker than normal, started today  HPI     Home Medications Prior to Admission medications   Medication Sig Start Date End Date Taking? Authorizing Provider  albuterol (PROVENTIL) (2.5 MG/3ML) 0.083% nebulizer solution Take 3 mLs (2.5 mg total) by nebulization every 6 (six) hours as needed for up to 5 days for wheezing or shortness of breath. 04/15/21 04/20/21  Marcello Fennel, PA-C  aspirin EC 81 MG tablet Take 1 tablet (81 mg total) by mouth daily. 04/08/21 05/08/21  Luna Fuse, MD  atorvastatin (LIPITOR) 40 MG tablet Take 1 tablet (40 mg total) by mouth daily. Patient not taking: Reported on 01/15/2021 08/11/20   Guilford Shi, MD  atorvastatin (LIPITOR) 80 MG tablet TAKE 1 TABLET (80 MG TOTAL) BY MOUTH DAILY AT 6 PM. Patient not taking: Reported on 01/15/2021 05/06/20 05/06/21  Corena Herter, PA-C  atorvastatin (LIPITOR) 80 MG tablet TAKE 1 TABLET (80 MG TOTAL) BY MOUTH DAILY AT 6 PM. Patient not taking: Reported on 01/15/2021 01/17/20 01/16/21  Argentina Donovan, PA-C  azithromycin (ZITHROMAX) 250 MG tablet TAKE 2  TABLETS BY MOUTH ON DAY, TAKE 1 TABLET ON DAYS 2 THROUGH 5. Patient not taking: Reported on 01/15/2021 05/06/20 05/06/21  Corena Herter, PA-C  carvedilol (COREG) 25 MG tablet TAKE 1 TABLET (25 MG TOTAL) BY MOUTH 2 (TWO) TIMES DAILY WITH A MEAL. Patient not taking: Reported on 01/15/2021 10/14/20 10/14/21  Veryl Speak, MD  doxycycline (VIBRAMYCIN) 100 MG capsule Take 1 capsule (100 mg total) by mouth 2 (two) times daily for 7 days. 04/15/21 04/22/21  Marcello Fennel, PA-C  furosemide (LASIX) 40 MG tablet Take 1 tablet (40 mg total) by mouth 2 (two) times daily. 04/08/21 05/08/21  Luna Fuse, MD  lisinopril (ZESTRIL) 40 MG tablet TAKE 1 TABLET (40 MG TOTAL) BY MOUTH DAILY. Patient taking differently: Take 40 mg by mouth in the morning. 12/17/20 12/17/21  Fatima Blank, MD  metoprolol succinate (TOPROL-XL) 25 MG 24 hr tablet Take 1 tablet (25 mg total) by mouth in the morning. 04/08/21 05/08/21  Luna Fuse, MD  nitroGLYCERIN (NITROSTAT) 0.4 MG SL tablet Place 1 tablet (0.4 mg total) under the tongue every 5 (five) minutes x 3 doses as needed for chest pain. 08/10/20   Guilford Shi, MD  potassium chloride (KLOR-CON M) 10 MEQ tablet Take 1 tablet (10 mEq total) by mouth daily. Patient not taking: Reported on 04/15/2021 04/08/21 05/08/21  Luna Fuse, MD  predniSONE (DELTASONE) 10 MG tablet Take 3 tablets (30 mg total) by mouth daily for 5 days. 04/15/21  04/20/21  Marcello Fennel, PA-C      Allergies    Ivp dye [iodinated contrast media] and Tramadol    Review of Systems   Review of Systems  Respiratory:  Positive for cough and shortness of breath.   Cardiovascular:  Positive for chest pain.  Skin:  Positive for color change.  All other systems reviewed and are negative.  Physical Exam Updated Vital Signs BP (!) 160/106    Pulse (!) 105    Temp 98.6 F (37 C) (Oral)    Resp (!) 22    SpO2 93%  Physical Exam Vitals and nursing note reviewed.  Constitutional:       General: He is not in acute distress.    Appearance: Normal appearance. He is ill-appearing (Appears chronically ill).  HENT:     Head: Normocephalic and atraumatic.  Eyes:     Conjunctiva/sclera: Conjunctivae normal.     Pupils: Pupils are equal, round, and reactive to light.  Cardiovascular:     Rate and Rhythm: Regular rhythm. Tachycardia present.     Pulses: Normal pulses.  Pulmonary:     Effort: Pulmonary effort is normal. Tachypnea present.     Breath sounds: Decreased breath sounds and wheezing present.     Comments: Speaking in short sentences.  Mildly tachypneic.  Diminished lung sounds throughout with faint expiratory wheezing. Abdominal:     General: There is no distension.     Palpations: Abdomen is soft.     Tenderness: There is no abdominal tenderness.  Musculoskeletal:        General: Normal range of motion.     Cervical back: Normal range of motion and neck supple.     Right lower leg: No edema.     Left lower leg: No edema.  Skin:    General: Skin is warm and dry.     Capillary Refill: Capillary refill takes less than 2 seconds.     Comments: Lacy-like skin appearance of bilateral knees, they feel cold bilaterally when compared to the rest of the extremity.  Good pedal pulses bilaterally. Hands and feel ate cold, but not dusky or cyanotic.   Neurological:     Mental Status: He is alert and oriented to person, place, and time.  Psychiatric:        Mood and Affect: Mood and affect normal.        Speech: Speech normal.        Behavior: Behavior normal.    ED Results / Procedures / Treatments   Labs (all labs ordered are listed, but only abnormal results are displayed) Labs Reviewed  BASIC METABOLIC PANEL - Abnormal; Notable for the following components:      Result Value   Chloride 97 (*)    Glucose, Bld 102 (*)    All other components within normal limits  CBC WITH DIFFERENTIAL/PLATELET - Abnormal; Notable for the following components:   WBC 13.0 (*)     Hemoglobin 17.6 (*)    HCT 56.5 (*)    MCV 100.7 (*)    Neutro Abs 8.2 (*)    Monocytes Absolute 1.5 (*)    Abs Immature Granulocytes 0.09 (*)    All other components within normal limits  BRAIN NATRIURETIC PEPTIDE - Abnormal; Notable for the following components:   B Natriuretic Peptide 687.9 (*)    All other components within normal limits  I-STAT VENOUS BLOOD GAS, ED - Abnormal; Notable for the following components:   pH, Ven 7.453 (*)  pO2, Ven 27.0 (*)    Bicarbonate 37.7 (*)    TCO2 39 (*)    Acid-Base Excess 11.0 (*)    Calcium, Ion 1.09 (*)    HCT 53.0 (*)    Hemoglobin 18.0 (*)    All other components within normal limits  TROPONIN I (HIGH SENSITIVITY) - Abnormal; Notable for the following components:   Troponin I (High Sensitivity) 65 (*)    All other components within normal limits  TROPONIN I (HIGH SENSITIVITY) - Abnormal; Notable for the following components:   Troponin I (High Sensitivity) 65 (*)    All other components within normal limits  RESP PANEL BY RT-PCR (FLU A&B, COVID) ARPGX2  D-DIMER, QUANTITATIVE    EKG EKG Interpretation  Date/Time:  Sunday April 19 2021 11:12:39 EST Ventricular Rate:  112 PR Interval:  124 QRS Duration: 84 QT Interval:  328 QTC Calculation: 447 R Axis:   73 Text Interpretation: Sinus tachycardia Pulmonary disease pattern Nonspecific ST and T wave abnormality No significant change since last tracing Confirmed by Lajean Saver [id number]) on 04/19/2021 12:21:53 PM  Radiology DG Chest 2 View  Result Date: 04/19/2021 CLINICAL DATA:  Shortness of breath for 1.5 weeks. EXAM: CHEST - 2 VIEW COMPARISON:  April 15, 2021 FINDINGS: The heart size and mediastinal contours are within normal limits. Both lungs are clear. The visualized skeletal structures are stable. IMPRESSION: No active cardiopulmonary disease. Electronically Signed   By: Abelardo Diesel M.D.   On: 04/19/2021 12:35    Procedures Procedures    Medications Ordered in  ED Medications  albuterol (PROVENTIL) (2.5 MG/3ML) 0.083% nebulizer solution 12 mL (12 mLs Nebulization New Bag/Given 04/19/21 1456)  ipratropium-albuterol (DUONEB) 0.5-2.5 (3) MG/3ML nebulizer solution 3 mL (3 mLs Nebulization Given 04/19/21 1314)  methylPREDNISolone sodium succinate (SOLU-MEDROL) 125 mg/2 mL injection 125 mg (125 mg Intravenous Given 04/19/21 1313)  albuterol (PROVENTIL) (2.5 MG/3ML) 0.083% nebulizer solution 2.5 mg (2.5 mg Nebulization Given 04/19/21 1317)  ipratropium (ATROVENT) nebulizer solution 0.5 mg (0.5 mg Nebulization Given 04/19/21 1456)    ED Course/ Medical Decision Making/ A&P                           Medical Decision Making   This patient presents to the ED for concern of shortness of breath and chest pain. This involves an extensive number of treatment options, and is a complaint that carries with it a high risk of complications and morbidity.  The differential diagnosis includes pneumonia, viral illness, COPD exacerbation, PE, ACS   Co morbidities:  COPD, CAD, HTN, CHF   Lab Tests: Labs obtained from triage interpreted by me, overall reassuring.  My leukocytosis of 13, could be due to prednisone.  Troponin minimally elevated at 65, will trend.  This is up from last week, however similar.  We will BNP is minimally elevated, but similar to baseline.  Electrolytes otherwise stable.  Dimer negative, doubt PE.  Respiratory panel is normal.  VBG added on due to patient's reports of skin discoloration.  VBG shows low PO2, mild hypercarbia, c/w COPD diagnosis.  Imaging Studies:  I ordered imaging studies including chest x-ray I independently visualized and interpreted imaging which showed no pneumonia, pneumothorax, effusion I agree with the radiologist interpretation   Medicines ordered:  I ordered medication including breathing treatment and Solu-Medrol for COPD exacerbation Reevaluation of the patient after these medicines showed that the patient  improved  Test Considered:  considered CTA, but in the setting of negative dimer and improvement with albuterol, doubt PE    Dispostion:  After consideration of the diagnostic results and the patients response to treatment, I feel that the patent would benefit from hospitalization for further management of his breathing and COPD.  Will call for admission.  Discussed with internal medicine teaching service, patient be admitted    Final Clinical Impression(s) / ED Diagnoses Final diagnoses:  COPD exacerbation [facility])    Rx / DC Orders ED Discharge Orders     None         Franchot Heidelberg, PA-C 04/19/21 1526    Lajean Saver, MD 04/20/21 [phone]

## 2021-04-19 NOTE — ED Notes (Signed)
The pt still getting a hhn  he reports some chl chest pain  elevated bp   he has hypertension

## 2021-04-19 NOTE — H&P (Signed)
Date: 04/19/2021               Patient Name:  Francisco [name] MRN: [id number]  DOB: [birth year] Age / Sex: 61 y.o., male   PCP: Pcp, No         Medical Service: Internal Medicine Teaching Service         Attending Physician: Dr. Inez Catalina, MD    First Contact: Adron Bene, MD Pager: [place] [phone]  Second Contact: Thurmon Fair, MD Pager: Cecilie Kicks [phone]       After Hours (After 5p/  First Contact Pager: [phone]  weekends / holidays): Second Contact Pager: [phone]   SUBJECTIVE   Chief Complaint: Shortness of breath , chest pain   History of Present Illness: Racyn Reinwald is a 61 year old male living with HTN, CAD s/p cardiac stents, congestive heart failure (EF 35 to 40%  TTE 08/09/2020), Chronic Hep C untreated, IV Methamphetamine use, and COPD treated with rescue inhaler who presents with shortness of breath and tightness in his chest.  Patient reports increased dyspnea, increased sputum production and sputum has been thicker.  He has been treated in the ED with breathing treatments and steroids several times in the past month with improvement.  He has been unable to afford antibiotics written and only using a maintenance inhaler.  He uses he maintenance inhalers estimated 6 times per day. His breathing has become very difficult in the past week and he has not been able to go to work. He has cut back from 2 packs of cigarettes per day to 5 per day.   Review of Systems  Constitutional:  Negative for chills and fever.  HENT:  Negative for congestion and sinus pain.   Eyes:  Negative for blurred vision and double vision.  Respiratory:  Positive for cough, sputum production and shortness of breath. Negative for stridor.   Cardiovascular:  Positive for chest pain (tightness). Negative for orthopnea, leg swelling and PND.  Gastrointestinal:  Negative for abdominal pain, diarrhea and vomiting.  Genitourinary:  Negative for dysuria.  Musculoskeletal:  Negative for falls and myalgias.  Neurological:   Negative for dizziness and headaches.  Psychiatric/Behavioral:  Positive for substance abuse. Negative for depression.     Meds:  Current Meds  Medication Sig   albuterol (PROVENTIL) (2.5 MG/3ML) 0.083% nebulizer solution Take 3 mLs (2.5 mg total) by nebulization every 6 (six) hours as needed for up to 5 days for wheezing or shortness of breath.   aspirin EC 81 MG tablet Take 1 tablet (81 mg total) by mouth daily.   furosemide (LASIX) 40 MG tablet Take 1 tablet (40 mg total) by mouth 2 (two) times daily.   lisinopril (ZESTRIL) 40 MG tablet TAKE 1 TABLET (40 MG TOTAL) BY MOUTH DAILY. (Patient taking differently: Take 40 mg by mouth in the morning.)   metoprolol succinate (TOPROL-XL) 25 MG 24 hr tablet Take 1 tablet (25 mg total) by mouth in the morning.   nitroGLYCERIN (NITROSTAT) 0.4 MG SL tablet Place 1 tablet (0.4 mg total) under the tongue every 5 (five) minutes x 3 doses as needed for chest pain.    Past Medical History:  Diagnosis Date   CHF (congestive heart failure) (HCC)    COPD (chronic obstructive pulmonary disease) (HCC)    Coronary artery disease    a. s/p prior PCI.   Hepatitis-C    Hypertension    IV drug abuse (HCC)    a. 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   Medically noncompliant  MI (myocardial infarction) (HCC)    Tobacco abuse     Past Surgical History:  Procedure Laterality Date   BACK SURGERY     NECK SURGERY     RIGHT/LEFT HEART CATH AND CORONARY ANGIOGRAPHY N/A 04/30/2019   Procedure: RIGHT/LEFT HEART CATH AND CORONARY ANGIOGRAPHY;  Surgeon: Marykay Lex, MD;  Location: [facility];  Service: Cardiovascular;  Laterality: N/A;   stints      Social:  Lives With:Roommate Occupation: Technical brewer  Support: Family lives in [place].Next of kin is father.  Level of Function: Performs his own ADL's and IADL's JGO:TLXBWIOMBT [facility]. He needs to reestablish.  Substances: Previously used IV  heroin, but no longer uses.  Last use IV methamphetamine 3 days ago.  Has cut back to 5 cigarettes/day. No alcohol use.   Family History:  Family History  Problem Relation Age of Onset   Rheum arthritis Mother    Heart disease Paternal Grandfather   Reports significant heart disease on his fathers side of family. Many men have had MI in their 8's.   Allergies: Allergies as of 04/19/2021 - Review Complete 04/19/2021  Allergen Reaction Noted   Ivp dye [iodinated contrast media] Other (See Comments) 06/12/2018   Tramadol Other (See Comments) 06/12/2018     OBJECTIVE:   Physical Exam: Blood pressure (!) 179/109, pulse (!) 112, temperature 98.6 F (37 C), temperature source Oral, resp. rate (!) 21, SpO2 95 %.  Constitutional: Middle age man sitting in hospital bed using breathing treatment, in no acute distress, talking in completes sentences  HENT: normocephalic atraumatic, mucous membranes moist Eyes: conjunctiva non-erythematous Neck: supple, trachea midline Cardiovascular: Tachycardic and regular rhythm, no m/r/g. No lower extremity edema. No JVD Pulmonary/Chest: normal work of breathing on room air, diminished lung sounds in mid and lower lung fields  Abdominal: soft, non-tender, non-distended MSK: normal bulk and tone Neurological: alert & oriented x 4, 5/5 strength in bilateral upper and lower extremities Skin: net pattern with reddish blue skin discoloration on bilateral knees, cool feet, cool hands Psych: normal mood , normal affect, cooperative.   Labs: CBC    Component Value Date/Time   WBC 13.0 (H) 04/19/2021 1136   RBC 5.61 04/19/2021 1136   HGB 18.0 (H) 04/19/2021 1409   HCT 53.0 (H) 04/19/2021 1409   PLT 346 04/19/2021 1136   MCV 100.7 (H) 04/19/2021 1136   MCH 31.4 04/19/2021 1136   MCHC 31.2 04/19/2021 1136   RDW 13.1 04/19/2021 1136   LYMPHSABS 2.8 04/19/2021 1136   MONOABS 1.5 (H) 04/19/2021 1136   EOSABS 0.2 04/19/2021 1136   BASOSABS 0.1 04/19/2021  1136     CMP     Component Value Date/Time   NA 140 04/19/2021 1409   K 4.5 04/19/2021 1409   CL 97 (L) 04/19/2021 1136   CO2 32 04/19/2021 1136   GLUCOSE 102 (H) 04/19/2021 1136   BUN 13 04/19/2021 1136   CREATININE 1.10 04/19/2021 1136   CALCIUM 10.0 04/19/2021 1136   PROT 7.3 04/08/2021 1456   ALBUMIN 3.7 04/08/2021 1456   AST 24 04/08/2021 1456   ALT 29 04/08/2021 1456   ALKPHOS 53 04/08/2021 1456   BILITOT 0.9 04/08/2021 1456   GFRNONAA >60 04/19/2021 1136   GFRAA >60 01/11/2020 0422    Imaging: DG Chest 2 View  Result Date: 04/19/2021 CLINICAL DATA:  Shortness of breath for 1.5 weeks. EXAM: CHEST - 2 VIEW COMPARISON:  April 15, 2021 FINDINGS: The  heart size and mediastinal contours are within normal limits. Both lungs are clear. The visualized skeletal structures are stable. IMPRESSION: No active cardiopulmonary disease. Electronically Signed   By: Sherian Rein M.D.   On: 04/19/2021 12:35    EKG: personally reviewed my interpretation is NSR , 112/min PR QRS 84 ms QT corrected  447 ms.  Normal axis.  - Personally Reviewed and compared to previous EKG unchanged.      ASSESSMENT & PLAN:    Assessment & Plan by Problem: Principal Problem:   COPD with acute exacerbation (HCC) Active Problems:   Acute on chronic combined systolic and diastolic CHF (congestive heart failure) (HCC)   Methamphetamine abuse (HCC)   Tobacco abuse   Kadeem Hyle is a 61 y.o. living with COPD who presents with 3/3 cardinal symptoms of COPD exacerbation. He can not afford maintenance inhalers and continues to smoke tobacco which is likely contributing to acute exacerbation.   #COPD Exacerbation, Moderate to Severe  Patient reports shortness of breath limited  Patient presents with 3/3 cardinal signs of COPD exacerbation. Currently on room air but initially needing supplemental oxygen in ED. Improving with breathing treatments.  - Start LAMA and continue duonebs q6h as needed. - PO  prednisone through 1/5 - PO Azithromycin 500 mg 3 days. IV ceftriaxone plan for 5 days, can switch to PO option at discharge. - F/u blood cultures  #Chronic systolic and diastolic heart failure #Nonischemic dilated cardiomyopathy #CAD #HTN Patient last seen 6 months ago on admission for acute on chronic heart failure exacerbation.  He reports he has not been able to afford medications and has not been on goal-directed medical therapy.  He continues to use amphetamines.  Presentation consistent with problem above, but will check TTE.  Patient has cool extremities but hypertensive with normal pulse pressure. - TEE - Restart lisinopril - Restart BB tomorrow morning - Restart Statin  #Methamphetamine abuse - last IV use 3 days ago. Will obtain blood cultures to rule out blood stream infection. Elevated WBC likely in setting of recent prednisone.   #Chronic Hep C? Patient reported chronic Hep C infection.  I do not see hep C labs in chart. 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  -Hep C antibody with reflex quant RNA   #Tobacco use disorder - has cut back to 5 cigarettes per day. - Nicotine patch  Diet: Normal VTE: Enoxaparin IVF: None, Code: Full  Prior to Admission Living Arrangement: Home, living roommate Anticipated Discharge Location: Home Barriers to Discharge: Continued workup and treatment as above.   Dispo: Admit patient to Observation with expected length of stay less than 2 midnights.  Signed: Cleotilde Neer, MD Internal Medicine Resident PGY-3 Pager: [phone]  04/19/2021, 5:39 PM

## 2021-04-19 NOTE — ED Notes (Signed)
Pt woke up out of sleep and having a hard time breathing + some chest tightness - pt placed on 3L for comfort and PRN meds to be given - MD Allena Katz paged and notified

## 2021-04-19 NOTE — ED Notes (Signed)
Covid swab already sent to the lab according to the pt

## 2021-04-19 NOTE — ED Provider Notes (Signed)
Emergency Medicine Provider Triage Evaluation Note  Francisco [name] , a 61 y.o. male  was evaluated in triage.  Pt complains of shortness of breath, cough and chest pain.  Symptoms have been present and worsening for the past week.  Patient reports he is now having difficulty with any activity because he cannot catch his breath, cannot even take a shower today because he started to feel like he was going to pass out.  He reports a productive cough and some intermittent chest discomfort in the center of his chest.  He reports that he cannot take a deep breath, history of COPD, still smoking but has cut back to a few cigarettes a day.  Has used his albuterol inhaler at home without much improvement  Review of Systems  Positive: Shortness of breath, cough, chest pain Negative: Fever, abdominal pain,  Physical Exam  BP (!) 176/123 (BP Location: Right Arm)    Pulse (!) 113    Temp 98.6 F (37 C) (Oral)    Resp (!) 25    SpO2 92%  Gen:   Awake, no distress   Resp:  Work of breathing, frequent cough, significantly decreased breath sounds bilaterally with faint wheezing MSK:   Moves extremities without difficulty  Other:    Medical Decision Making  Medically screening exam initiated at 11:12 AM.  Appropriate orders placed.  Briar Witherspoon was informed that the remainder of the evaluation will be completed by another provider, this initial triage assessment does not replace that evaluation, and the importance of remaining in the ED until their evaluation is complete.  High clinical suspicion for COPD exacerbation, patient is very diminished, ordered DuoNeb and steroids, patient will need acute bed treatment of shortness of breath   Legrand Rams 04/19/21 1140    Linwood Dibbles, MD 04/19/21 [phone]

## 2021-04-20 ENCOUNTER — Inpatient Hospital Stay (HOSPITAL_COMMUNITY): Payer: Self-pay

## 2021-04-20 ENCOUNTER — Observation Stay (HOSPITAL_COMMUNITY): Payer: Self-pay

## 2021-04-20 ENCOUNTER — Observation Stay (HOSPITAL_BASED_OUTPATIENT_CLINIC_OR_DEPARTMENT_OTHER): Payer: Self-pay

## 2021-04-20 DIAGNOSIS — M62261 Nontraumatic ischemic infarction of muscle, right lower leg: Secondary | ICD-10-CM

## 2021-04-20 DIAGNOSIS — I5043 Acute on chronic combined systolic (congestive) and diastolic (congestive) heart failure: Secondary | ICD-10-CM

## 2021-04-20 DIAGNOSIS — M62262 Nontraumatic ischemic infarction of muscle, left lower leg: Secondary | ICD-10-CM

## 2021-04-20 DIAGNOSIS — N179 Acute kidney failure, unspecified: Secondary | ICD-10-CM

## 2021-04-20 LAB — COMPREHENSIVE METABOLIC PANEL
ALT: 45 U/L — ABNORMAL HIGH (ref 0–44)
AST: 31 U/L (ref 15–41)
Albumin: 3.6 g/dL (ref 3.5–5.0)
Alkaline Phosphatase: 65 U/L (ref 38–126)
Anion gap: 8 (ref 5–15)
BUN: 25 mg/dL — ABNORMAL HIGH (ref 6–20)
CO2: 30 mmol/L (ref 22–32)
Calcium: 9.6 mg/dL (ref 8.9–10.3)
Chloride: 100 mmol/L (ref 98–111)
Creatinine, Ser: 1.66 mg/dL — ABNORMAL HIGH (ref 0.61–1.24)
GFR, Estimated: 47 mL/min — ABNORMAL LOW (ref >60)
Glucose, Bld: 125 mg/dL — ABNORMAL HIGH (ref 70–99)
Potassium: 4.7 mmol/L (ref 3.5–5.1)
Sodium: 138 mmol/L (ref 135–145)
Total Bilirubin: 0.4 mg/dL (ref 0.3–1.2)
Total Protein: 7.5 g/dL (ref 6.5–8.1)

## 2021-04-20 LAB — ECHOCARDIOGRAM COMPLETE
AR max vel: 1.96 cm2
AV Area VTI: 1.95 cm2
AV Area mean vel: 2.07 cm2
AV Mean grad: 4.9 mmHg
AV Peak grad: 11.9 mmHg
Ao pk vel: 1.72 m/s
Area-P 1/2: 6.71 cm2
Calc EF: 55.5 %
S' Lateral: 3 cm
Single Plane A2C EF: 43.1 %
Single Plane A4C EF: 63.3 %

## 2021-04-20 LAB — URINALYSIS, ROUTINE W REFLEX MICROSCOPIC
Bilirubin Urine: NEGATIVE
Glucose, UA: NEGATIVE mg/dL
Hgb urine dipstick: NEGATIVE
Ketones, ur: NEGATIVE mg/dL
Leukocytes,Ua: NEGATIVE
Nitrite: NEGATIVE
Protein, ur: NEGATIVE mg/dL
Specific Gravity, Urine: 1.021 (ref 1.005–1.030)
pH: 5 (ref 5.0–8.0)

## 2021-04-20 LAB — HEPATITIS B SURFACE ANTIGEN: Hepatitis B Surface Ag: NONREACTIVE

## 2021-04-20 LAB — LIPID PANEL
Cholesterol: 215 mg/dL — ABNORMAL HIGH (ref 0–200)
HDL: 79 mg/dL (ref >40)
LDL Cholesterol: 117 mg/dL — ABNORMAL HIGH (ref 0–99)
Total CHOL/HDL Ratio: 2.7 RATIO
Triglycerides: 94 mg/dL (ref <150)
VLDL: 19 mg/dL (ref 0–40)

## 2021-04-20 LAB — MAGNESIUM: Magnesium: 2.9 mg/dL — ABNORMAL HIGH (ref 1.7–2.4)

## 2021-04-20 LAB — HEPATITIS B CORE ANTIBODY, IGM: Hep B C IgM: NONREACTIVE

## 2021-04-20 LAB — SODIUM, URINE, RANDOM: Sodium, Ur: 51 mmol/L

## 2021-04-20 MED ORDER — IPRATROPIUM-ALBUTEROL 0.5-2.5 (3) MG/3ML IN SOLN: 3.0000 mL | Freq: Four times a day (QID) | RESPIRATORY_TRACT | Status: DC

## 2021-04-20 MED ORDER — METOPROLOL SUCCINATE ER 25 MG PO TB24
25.0000 mg | ORAL_TABLET | Freq: Every morning | ORAL | Status: DC
  Administered 2021-04-20 – 2021-04-21 (×2): 25 mg via ORAL
  Filled 2021-04-20 (×2): qty 1

## 2021-04-20 MED ORDER — RAMELTEON 8 MG PO TABS
8.0000 mg | ORAL_TABLET | Freq: Every day | ORAL | Status: DC
  Administered 2021-04-20: 8 mg via ORAL
  Filled 2021-04-20 (×2): qty 1

## 2021-04-20 MED ORDER — IPRATROPIUM-ALBUTEROL 0.5-2.5 (3) MG/3ML IN SOLN
3.0000 mL | RESPIRATORY_TRACT | Status: AC
  Administered 2021-04-20 (×3): 3 mL via RESPIRATORY_TRACT
  Filled 2021-04-20 (×3): qty 3

## 2021-04-20 MED ORDER — LACTATED RINGERS IV BOLUS
1000.0000 mL | Freq: Once | INTRAVENOUS | Status: AC
  Administered 2021-04-20: 1000 mL via INTRAVENOUS

## 2021-04-20 MED ORDER — IPRATROPIUM-ALBUTEROL 0.5-2.5 (3) MG/3ML IN SOLN
3.0000 mL | Freq: Four times a day (QID) | RESPIRATORY_TRACT | Status: DC
  Administered 2021-04-20 (×3): 3 mL via RESPIRATORY_TRACT
  Filled 2021-04-20 (×3): qty 3

## 2021-04-20 MED ORDER — IPRATROPIUM-ALBUTEROL 0.5-2.5 (3) MG/3ML IN SOLN
3.0000 mL | RESPIRATORY_TRACT | Status: DC | PRN
  Administered 2021-04-20 – 2021-04-21 (×3): 3 mL via RESPIRATORY_TRACT
  Filled 2021-04-20 (×4): qty 3

## 2021-04-20 NOTE — ED Notes (Signed)
MD Carter at bedside  

## 2021-04-20 NOTE — Progress Notes (Signed)
HD#0 SUBJECTIVE:  Patient Summary: Francisco [name] is a 61 y.o. living with COPD who presents with 3/3 cardinal symptoms of COPD exacerbation. He can not afford maintenance inhalers and continues to smoke tobacco which is likely contributing to acute exacerbation.   Overnight Events: Patient continued to feel short of breath overnight and received Duonebs x3.  Interim History: This AM, he states he feels better than yesterday, but still short of breath.   OBJECTIVE:  Vital Signs: Vitals:   04/20/21 0515 04/20/21 0530 04/20/21 0615 04/20/21 0645  BP: (!) 177/105 (!) 168/102 (!) 196/110 (!) 119/101  Pulse: (!) 102 (!) 104 (!) 112 (!) 108  Resp: 20 20  (!) 26  Temp:      TempSrc:      SpO2: (!) 89% (!) 88%  93%   Supplemental O2: room air SpO2: 93 %  There were no vitals filed for this visit.  No intake or output data in the 24 hours ending 04/20/21 0701 Net IO Since Admission: No IO data has been entered for this period [04/20/21 0701]  Physical Exam: Gen: well-developed, well-nourished, in no acute distress, talking in full sentances HENT: NCAT, mucous membranes moist Eyes: No scleral icterus, conjunctiva clear Heart: RRR, no murmurs Lungs: Bilateral diffuse wheezes present in lung fields, normal work of breathing Abd: nontender, nondistended, soft MSK: skin discoloration with darkened netlike rash on knees bilaterally, feet cool to the touch with darkened skin color, dorsalis pedis pulses not found, no peripheral edema present in lower extremities bilaterally Psych: normal mood and affect  Patient Lines/Drains/Airways Status     Active Line/Drains/Airways     Name Placement date Placement time Site Days   Peripheral IV 04/19/21 20 G Anterior;Left;Proximal Forearm 04/19/21  1310  Forearm  1            Pertinent Labs: CBC Latest Ref Rng & Units 04/19/2021 04/19/2021 04/15/2021  WBC 4.0 - 10.5 K/uL - 13.0(H) -  Hemoglobin 13.0 - 17.0 g/dL 18.0(H) 17.6(H) 17.3(H)   Hematocrit 39.0 - 52.0 % 53.0(H) 56.5(H) 51.0  Platelets 150 - 400 K/uL - 346 -    CMP Latest Ref Rng & Units 04/20/2021 04/19/2021 04/19/2021  Glucose 70 - 99 mg/dL 125(H) - 102(H)  BUN 6 - 20 mg/dL 25(H) - 13  Creatinine 0.61 - 1.24 mg/dL 1.66(H) - 1.10  Sodium 135 - 145 mmol/L 138 140 140  Potassium 3.5 - 5.1 mmol/L 4.7 4.5 4.0  Chloride 98 - 111 mmol/L 100 - 97(L)  CO2 22 - 32 mmol/L 30 - 32  Calcium 8.9 - 10.3 mg/dL 9.6 - 10.0  Total Protein 6.5 - 8.1 g/dL 7.5 - -  Total Bilirubin 0.3 - 1.2 mg/dL 0.4 - -  Alkaline Phos 38 - 126 U/L 65 - -  AST 15 - 41 U/L 31 - -  ALT 0 - 44 U/L 45(H) - -    No results for input(s): GLUCAP in the last 72 hours.   Pertinent Imaging: DG Chest 2 View  Result Date: 04/19/2021 CLINICAL DATA:  Shortness of breath for 1.5 weeks. EXAM: CHEST - 2 VIEW COMPARISON:  April 15, 2021 FINDINGS: The heart size and mediastinal contours are within normal limits. Both lungs are clear. The visualized skeletal structures are stable. IMPRESSION: No active cardiopulmonary disease. Electronically Signed   By: Francisco [name] M.D.   On: 04/19/2021 12:35    ASSESSMENT/PLAN:  Assessment: Principal Problem:   COPD with acute exacerbation (HCC) Active Problems:   Acute on  chronic combined systolic and diastolic CHF (congestive heart failure) (HCC)   Methamphetamine abuse ([place])   Tobacco abuse   Plan: Francisco [name] is a 61 y.o. living with COPD who presents with 3/3 cardinal symptoms of COPD exacerbation. He can not afford maintenance inhalers and continues to smoke tobacco which is likely contributing to acute exacerbation.    #COPD Exacerbation, Moderate to Severe  Patient reports shortness of breath limited  Patient presents with 3/3 cardinal signs of COPD exacerbation. Currently on room air but initially needing supplemental oxygen in ED. Improving with breathing treatments. Patient will need to be set up with community health and wellness on discharge. - Start LAMA  and scheduled Duonebs until 01/03 am then start LAMA inhaler  - PO prednisone through 1/5 - PO Azithromycin 500 mg 3 days.  - Received 1 day IV ceftriaxone, CXR with no consolidations, will discontinue Ceftriaxone - F/u blood cultures   #Chronic systolic and diastolic heart failure #Nonischemic dilated cardiomyopathy #CAD s/p stent to RCA  #HTN #PAD Patient last seen 6 months ago on admission for acute on chronic heart failure exacerbation.  He reports he has not been able to afford medications and has not been on goal-directed medical therapy.  He continues to use amphetamines.  Presentation consistent with problem above, but will check TTE.  Francisco [name] showed severe small vessel disease bilaterally with right mild ischemia in DPA and moderate left lower extremity arterial disease. - f/u TTE - holding lisinopril 2/2 to AKI - Restart BB tomorrow morning - Restart Statin -Continue aspirin and statin for PAD  #AKI Creatinine elevated from 1.1 to 1.66 today. No urine output reported, but patient reports urinating several times overnight. Renal Ultrasound showed small right renal cyst.  Will get additional work-up but thinking this is likely pre-renal.   -1 L LR given at 200 ml/hr -follow-up UA -follow-up urine sodium   #Chronic Hep C? 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  -Hep C antibody with reflex quant RNA   #Methamphetamine abuse - last IV use 3 days ago. Will obtain blood cultures to rule out blood stream infection. Elevated WBC likely in setting of recent prednisone.    #Tobacco use disorder - has cut back to 5 cigarettes per day. - Nicotine patch  Best Practice: Diet: Regular diet IVF: Fluids: LR, Rate:  200 cc/hr x 5 hrs VTE: enoxaparin (LOVENOX) injection 40 mg Start: 04/19/21 1829 Code: Full AB: Azithromycin day 2/3 Therapy Recs:  N/A DISPO: Anticipated discharge tomorrow to Home pending Medical stability.  Signature: Francisco [name], D.O.  Internal Medicine Resident, PGY-1 [name]  Pager: [phone] 7:01 AM, 04/20/2021   Please contact the on call pager after 5 pm and on weekends at [phone].

## 2021-04-20 NOTE — ED Notes (Signed)
Breakfast Orders Placed °

## 2021-04-20 NOTE — Care Management (Signed)
Consult for medication assistance and to re establish care at [facility].    NCM changed pharmacy to [place] and will assist at discharge with [facility].   [facility] closed today due to holiday. Information placed on AVS and in [facility] handoff to call closer to discharge.

## 2021-04-20 NOTE — Evaluation (Signed)
Occupational Therapy Evaluation Patient Details Name: Francisco [name] MRN: [id number] DOB: 1960/09/05 Today's Date: 04/20/2021   History of Present Illness Clarke Peretz is a 61 year old male living with HTN, CAD s/p cardiac stents, congestive heart failure (EF 35 to 40%  TTE 08/09/2020), Chronic Hep C untreated, IV Methamphetamine use, and COPD treated with rescue inhaler who presents with shortness of breath and tightness in his chest.  Has recent ED visits due to same with improvement with treatments and steroids.   Clinical Impression   Pt is functioning modified independently in ADL and independently in mobility. Educated in energy conservation, breathing techniques and recommended seated showering. No further OT needs.      Recommendations for follow up therapy are one component of a multi-disciplinary discharge planning process, led by the attending physician.  Recommendations may be updated based on patient status, additional functional criteria and insurance authorization.   Follow Up Recommendations  No OT follow up    Assistance Recommended at Discharge None  Functional Status Assessment  Patient has had a recent decline in their functional status and demonstrates the ability to make significant improvements in function in a reasonable and predictable amount of time.  Equipment Recommendations  None recommended by OT    Recommendations for Other Services       Precautions / Restrictions Precautions Precautions: Other (comment) Precaution Comments: watch SpO2      Mobility Bed Mobility Overal bed mobility: Independent                  Transfers Overall transfer level: Independent Equipment used: None                      Balance Overall balance assessment: Independent                                         ADL either performed or assessed with clinical judgement   ADL Overall ADL's : Modified independent                                        General ADL Comments: educated in energy conservation strategies and recommended seated showering     Vision Ability to See in Adequate Light: 0 Adequate       Perception     Praxis      Pertinent Vitals/Pain Pain Assessment: No/denies pain Faces Pain Scale: Hurts little more Pain Location: R hip Pain Descriptors / Indicators: Aching Pain Intervention(s): Repositioned     Hand Dominance Right   Extremity/Trunk Assessment Upper Extremity Assessment Upper Extremity Assessment: Overall WFL for tasks assessed   Lower Extremity Assessment Lower Extremity Assessment: Defer to PT evaluation   Cervical / Trunk Assessment Cervical / Trunk Assessment: Normal   Communication Communication Communication: No difficulties   Cognition Arousal/Alertness: Awake/alert Behavior During Therapy: WFL for tasks assessed/performed Overall Cognitive Status: Within Functional Limits for tasks assessed                                       General Comments      Exercises     Shoulder Instructions      Home Living Family/patient expects to be discharged to:: Private residence Living  Arrangements: Non-relatives/Friends Available Help at Discharge: Friend(s) Type of Home: House Home Access: Stairs to enter [employer] of Steps: ~10 3 with rail, then 1 then 2 then 3 more with rail   Home Layout: One level     Bathroom Shower/Tub: Chief Strategy Officer: Standard     Home Equipment: None          Prior Functioning/Environment Prior Level of Function : Independent/Modified Independent             Mobility Comments: two weeks with difficulty breathing trouble taking shower and out of work, works as a Pharmacist, 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          OT Problem List:        OT Treatment/Interventions:      OT Goals(Current goals can be found in the care plan section)    OT Frequency:     Barriers to D/C:             Co-evaluation              AM-PAC OT "6 Clicks" Daily Activity     Outcome Measure Help from another person eating meals?: None Help from another person taking care of personal grooming?: None Help from another person toileting, which includes using toliet, bedpan, or urinal?: None Help from another person bathing (including washing, rinsing, drying)?: None Help from another person to put on and taking off regular upper body clothing?: None Help from another person to put on and taking off regular lower body clothing?: None 6 Click Score: 24   End of Session    Activity Tolerance:   Patient left: in bed;with call bell/phone within reach  OT Visit Diagnosis: Other (comment) (decreased activity tolerance)                Time: 1242-1300 OT Time Calculation (min): 18 min Charges:  OT General Charges $OT Visit: 1 Visit OT Evaluation $OT Eval Low Complexity: 1 Low  Martie Round, OTR/L Acute Rehabilitation Services Pager: [phone] Office: [phone]  Evern Bio 04/20/2021, 1:17 PM

## 2021-04-20 NOTE — Evaluation (Signed)
Physical Therapy Evaluation Patient Details Name: Francisco [name] MRN: [id number] DOB: 10-23-1960 Today's Date: 04/20/2021  History of Present Illness  Francisco [name] is a 61 year old male living with HTN, CAD s/p cardiac stents, congestive heart failure (EF 35 to 40%  TTE 08/09/2020), Chronic Hep C untreated, IV Methamphetamine use, and COPD treated with rescue inhaler who presents with shortness of breath and tightness in his chest.  Has recent ED visits due to same with improvement with treatments and steroids.  Clinical Impression  Patient presents with decreased mobility due to decreased activity tolerance and decreased cardiopulmonary endurance.  He will benefit from skilled PT in the acute setting to maximize mobility and safety including stairs for home entry and for education on energy conservation.  Likely no follow up PT needs at d/c.      Recommendations for follow up therapy are one component of a multi-disciplinary discharge planning process, led by the attending physician.  Recommendations may be updated based on patient status, additional functional criteria and insurance authorization.  Follow Up Recommendations No PT follow up    Assistance Recommended at Discharge PRN  Functional Status Assessment Patient has had a recent decline in their functional status and demonstrates the ability to make significant improvements in function in a reasonable and predictable amount of time.  Equipment Recommendations  Other (comment) (shower seat)    Recommendations for Other Services       Precautions / Restrictions Precautions Precaution Comments: watch SpO2      Mobility  Bed Mobility Overal bed mobility: Independent                  Transfers Overall transfer level: Independent                      Ambulation/Gait Ambulation/Gait assistance: Independent Gait Distance (Feet): 200 Feet (one standing rest break) Assistive device: None Gait Pattern/deviations:  WFL(Within Functional Limits)          Stairs            Wheelchair Mobility    Modified Rankin (Stroke Patients Only)       Balance Overall balance assessment: Independent                                           Pertinent Vitals/Pain Pain Assessment: Faces Faces Pain Scale: Hurts little more Pain Location: R hip Pain Descriptors / Indicators: Aching Pain Intervention(s): Repositioned    Home Living Family/patient expects to be discharged to:: Private residence Living Arrangements: Non-relatives/Friends Available Help at Discharge: Friend(s) (roomate) Type of Home: House Home Access: Stairs to enter   [employer] of Steps: ~10 3 with rail, then 1 then 2 then 3 more with rail   Home Layout: One level Home Equipment: None      Prior Function Prior Level of Function : Independent/Modified Independent             Mobility Comments: two weeks with difficulty breathing trouble taking shower and out of work, works as a Pharmacist, 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       Higher education careers adviser        Extremity/Trunk Assessment   Upper Extremity Assessment Upper Extremity Assessment: Overall WFL for tasks assessed    Lower Extremity Assessment Lower Extremity Assessment: Overall WFL for tasks assessed    Cervical / Trunk Assessment Cervical / Trunk Assessment:  (reports  several prior back surgeries)  Communication   Communication: No difficulties  Cognition Arousal/Alertness: Awake/alert Behavior During Therapy: WFL for tasks assessed/performed Overall Cognitive Status: Within Functional Limits for tasks assessed                                          General Comments General comments (skin integrity, edema, etc.): winded with RR up to 35 with ambulation and SpO2 as low as 89%.  Audible restrictions to inhalation at times.   Discussed will educate on energy conservation, pt feels he will need to apply for disability, but  concerned about losing his housing if he cannot return to work.    Exercises     Assessment/Plan    PT Assessment Patient needs continued PT services  PT Problem List Decreased activity tolerance;Decreased mobility;Cardiopulmonary status limiting activity       PT Treatment Interventions Therapeutic activities;DME instruction;Therapeutic exercise;Gait training;Patient/family education;Stair training;Functional mobility training    PT Goals (Current goals can be found in the Care Plan section)  Acute Rehab PT Goals Patient Stated Goal: to return to work PT Goal Formulation: With patient Time For Goal Achievement: 05/04/21 Potential to Achieve Goals: Good    Frequency Min 3X/week   Barriers to discharge        Co-evaluation               AM-PAC PT "6 Clicks" Mobility  Outcome Measure Help needed turning from your back to your side while in a flat bed without using bedrails?: None Help needed moving from lying on your back to sitting on the side of a flat bed without using bedrails?: None Help needed moving to and from a bed to a chair (including a wheelchair)?: None Help needed standing up from a chair using your arms (e.g., wheelchair or bedside chair)?: None Help needed to walk in hospital room?: None Help needed climbing 3-5 steps with a railing? : A Lot 6 Click Score: 22    End of Session   Activity Tolerance: Patient limited by fatigue;Other (comment) (increased RR, decreased SpO2) Patient left: in bed;Other (comment) (for transport to vascular)   PT Visit Diagnosis: Difficulty in walking, not elsewhere classified (R26.2)    Time: 1020-1035 PT Time Calculation (min) (ACUTE ONLY): 15 min   Charges:   PT Evaluation $PT Eval Low Complexity: 1 Low          Francisco [name], PT Acute Rehabilitation Services [phone] Office:[phone] 04/20/2021   Francisco [name] 04/20/2021, 10:51 AM

## 2021-04-20 NOTE — ED Notes (Signed)
Pt ambulatory to restroom - pt O2 remained 95% and above

## 2021-04-20 NOTE — Progress Notes (Signed)
Patient arrived to 6  north room 15 alert and oriented. Pain level 0. Call light in reach. Bed in lowest position.

## 2021-04-20 NOTE — Progress Notes (Signed)
ABI has been completed.  Results can be found under chart review under CV PROC. 04/20/2021 11:37 AM Jahsiah Carpenter RVT, RDMS

## 2021-04-20 NOTE — Progress Notes (Signed)
°  Echocardiogram 2D Echocardiogram has been performed.  Augustine Radar 04/20/2021, 4:06 PM

## 2021-04-21 ENCOUNTER — Other Ambulatory Visit (HOSPITAL_COMMUNITY): Payer: Self-pay

## 2021-04-21 ENCOUNTER — Telehealth: Payer: Self-pay

## 2021-04-21 LAB — BASIC METABOLIC PANEL
Anion gap: 7 (ref 5–15)
BUN: 23 mg/dL — ABNORMAL HIGH (ref 6–20)
CO2: 32 mmol/L (ref 22–32)
Calcium: 9.1 mg/dL (ref 8.9–10.3)
Chloride: 102 mmol/L (ref 98–111)
Creatinine, Ser: 1.38 mg/dL — ABNORMAL HIGH (ref 0.61–1.24)
GFR, Estimated: 59 mL/min — ABNORMAL LOW (ref >60)
Glucose, Bld: 95 mg/dL (ref 70–99)
Potassium: 4.7 mmol/L (ref 3.5–5.1)
Sodium: 141 mmol/L (ref 135–145)

## 2021-04-21 LAB — RHEUMATOID FACTOR: Rheumatoid fact SerPl-aCnc: 10.7 IU/mL (ref <14.0)

## 2021-04-21 LAB — HEPATITIS B SURFACE ANTIBODY, QUANTITATIVE: Hep B S AB Quant (Post): <3.1 m[IU]/mL — ABNORMAL LOW (ref >9.9)

## 2021-04-21 LAB — CBC
HCT: 49.4 % (ref 39.0–52.0)
Hemoglobin: 15.7 g/dL (ref 13.0–17.0)
MCH: 32.1 pg (ref 26.0–34.0)
MCHC: 31.8 g/dL (ref 30.0–36.0)
MCV: 101 fL — ABNORMAL HIGH (ref 80.0–100.0)
Platelets: 322 10*3/uL (ref 150–400)
RBC: 4.89 MIL/uL (ref 4.22–5.81)
RDW: 13.2 % (ref 11.5–15.5)
WBC: 17.3 10*3/uL — ABNORMAL HIGH (ref 4.0–10.5)
nRBC: 0 % (ref 0.0–0.2)

## 2021-04-21 LAB — C4 COMPLEMENT: Complement C4, Body Fluid: 24 mg/dL (ref 12–38)

## 2021-04-21 LAB — ANA W/REFLEX IF POSITIVE: Anti Nuclear Antibody (ANA): NEGATIVE

## 2021-04-21 LAB — C3 COMPLEMENT: C3 Complement: 160 mg/dL (ref 82–167)

## 2021-04-21 MED ORDER — PREDNISONE 20 MG PO TABS
40.0000 mg | ORAL_TABLET | Freq: Every day | ORAL | 0 refills | Status: AC
  Filled 2021-04-21: qty 2, 1d supply, fill #0

## 2021-04-21 MED ORDER — ALBUTEROL SULFATE (2.5 MG/3ML) 0.083% IN NEBU
2.5000 mg | INHALATION_SOLUTION | Freq: Four times a day (QID) | RESPIRATORY_TRACT | 0 refills | Status: DC | PRN
  Filled 2021-04-21: qty 90, 8d supply, fill #0

## 2021-04-21 MED ORDER — UMECLIDINIUM BROMIDE 62.5 MCG/ACT IN AEPB
1.0000 | INHALATION_SPRAY | Freq: Every day | RESPIRATORY_TRACT | 0 refills | Status: DC
  Filled 2021-04-21: qty 30, 30d supply, fill #0

## 2021-04-21 MED ORDER — ASPIRIN 81 MG PO TBEC
81.0000 mg | DELAYED_RELEASE_TABLET | Freq: Every day | ORAL | 2 refills | Status: AC
Start: 2021-04-21 — End: 2021-05-21
  Filled 2021-04-21: qty 30, 30d supply, fill #0

## 2021-04-21 MED ORDER — ALBUTEROL SULFATE (2.5 MG/3ML) 0.083% IN NEBU: 2.5000 mg | INHALATION_SOLUTION | Freq: Four times a day (QID) | RESPIRATORY_TRACT | 2 refills | Status: DC | PRN

## 2021-04-21 MED ORDER — BENZONATATE 100 MG PO CAPS
100.0000 mg | ORAL_CAPSULE | Freq: Three times a day (TID) | ORAL | Status: DC | PRN
  Administered 2021-04-21: 100 mg via ORAL
  Filled 2021-04-21: qty 1

## 2021-04-21 MED ORDER — ATORVASTATIN CALCIUM 40 MG PO TABS
40.0000 mg | ORAL_TABLET | Freq: Every day | ORAL | 2 refills | Status: DC
Start: 2021-04-21 — End: 2021-06-24
  Filled 2021-04-21: qty 30, 30d supply, fill #0
  Filled 2021-05-26: qty 30, 30d supply, fill #1

## 2021-04-21 MED ORDER — METOPROLOL SUCCINATE ER 25 MG PO TB24
25.0000 mg | ORAL_TABLET | Freq: Every morning | ORAL | 2 refills | Status: DC
Start: 2021-04-21 — End: 2021-06-24
  Filled 2021-04-21: qty 30, 30d supply, fill #0
  Filled 2021-05-26: qty 30, 30d supply, fill #1

## 2021-04-21 MED ORDER — LACTATED RINGERS IV BOLUS
1000.0000 mL | Freq: Once | INTRAVENOUS | Status: AC
  Administered 2021-04-21: 1000 mL via INTRAVENOUS

## 2021-04-21 MED ORDER — NITROGLYCERIN 0.4 MG SL SUBL
0.4000 mg | SUBLINGUAL_TABLET | SUBLINGUAL | 2 refills | Status: DC | PRN
  Filled 2021-04-21: qty 25, 8d supply, fill #0

## 2021-04-21 MED ORDER — ALBUTEROL SULFATE (2.5 MG/3ML) 0.083% IN NEBU
2.5000 mg | INHALATION_SOLUTION | Freq: Four times a day (QID) | RESPIRATORY_TRACT | 2 refills | Status: DC | PRN
  Filled 2021-04-21: qty 360, 30d supply, fill #0

## 2021-04-21 NOTE — Telephone Encounter (Signed)
Hospital TOC per Dr. Sloan Leiter, discharge 04/21/2020, appt 04/23/2020.

## 2021-04-21 NOTE — Discharge Summary (Signed)
Name: Francisco [name] MRN: [id number] DOB: 27-Aug-1960 61 y.o. PCP: Pcp, No  Date of Admission: 04/19/2021 11:00 AM Date of Discharge: 04/21/2021 Attending Physician: Velna Ochs, MD  Discharge Diagnosis: 1. COPD Exacerbation 2. Chronic Hepatitis C 3. Acute Kidney Injury 4. Chronic Systolic and diastolic heart failure 5. CAD s/p stents to RCA 6. Hypertension 7. Methamphetamine abuse 8. Chronic Hepatitis C 9. Tobacco Use Disorder 10. Hyperlipidemia 11. Peripheral Artery Disease  Discharge Medications: Allergies as of 04/21/2021       Reactions   Ivp Dye [iodinated Contrast Media] Other (See Comments)   Seizures   Tramadol Other (See Comments)   Seizures        Medication List     STOP taking these medications    azithromycin 250 MG tablet Commonly known as: ZITHROMAX   carvedilol 25 MG tablet Commonly known as: COREG   doxycycline 100 MG capsule Commonly known as: VIBRAMYCIN   furosemide 40 MG tablet Commonly known as: Lasix   lisinopril 40 MG tablet Commonly known as: ZESTRIL   potassium chloride 10 MEQ tablet Commonly known as: KLOR-CON M       TAKE these medications    albuterol (2.5 MG/3ML) 0.083% nebulizer solution Commonly known as: PROVENTIL Take 3 mLs (2.5 mg total) by nebulization every 6 (six) hours as needed for up to 5 days for wheezing or shortness of breath.   aspirin EC 81 MG tablet Take 1 tablet (81 mg total) by mouth daily.   atorvastatin 40 MG tablet Commonly known as: LIPITOR Take 1 tablet (40 mg total) by mouth daily. What changed: Another medication with the same name was removed. Continue taking this medication, and follow the directions you see here.   Incruse Ellipta 62.5 MCG/ACT Aepb Generic drug: umeclidinium bromide Inhale 1 puff into the lungs daily. Start taking on: April 22, 2021   metoprolol succinate 25 MG 24 hr tablet Commonly known as: TOPROL-XL Take 1 tablet (25 mg total) by mouth in the morning.    nitroGLYCERIN 0.4 MG SL tablet Commonly known as: NITROSTAT Place 1 tablet (0.4 mg total) under the tongue every 5 (five) minutes x 3 doses as needed for chest pain.   predniSONE 20 MG tablet Commonly known as: DELTASONE Take 2 tablets (40 mg total) by mouth daily with breakfast for 1 day. Start taking on: April 22, 2021 What changed:  medication strength how much to take when to take this        Disposition and follow-up:   FranciscoWess [name] was discharged from [facility] in Stable condition.  At the hospital follow up visit please address:  1. COPD exacerbation- [name] presented with severe COPD exacerbation 2/2 to not being able to afford inhalers and continued tobacco usage. He has been to ED 7 times in last 6 months due to shortness of breath.  He received 3 days of azithromycin in the hospital and will complete 5 days of prednisone 40 mg on 04/23/2021.  Patient provided with Incruse Ellipta and albuterol inhalers prior to discharge.  Please ensure patient has refills for inhalers, previously he has refilled medications with [employer] and wellness.  2. AKI- Creatinine increased to 1.66 during hospital course. UA with specific gravity wnl, no casts, UA Sodium elevated at 54. Renal US showed small right renal cyst. However with decreased PO intake, thinking AKI is pre-renal.  Creatinine improved to 1.38 with 1 LR.Lisinopril held on discharge. Please recheck BMP before resuming ACEI.   3. Hepatitis  C- On admission patient stated that he had been diagnosed with Hep C but never treated. He had a rash on legs bilaterally that was consistent with livedo reticularis.  Additional lab evaluation ordered to evaluate for cutaneous vasculitis.  Please follow-up on this and HCV.  4. PAD, HLD- restarted atorvastatin 40 mg. LDL at 117 which is not at goal for secondary prevention.  Please recheck lipid panel in 6-8 weeks.  Labs / imaging needed at time of follow-up: BMP,  CBC  Pending labs/ test needing follow-up: HCV Ab reflex, Hepatitis B surface antigen/ antibody, Hbc, C3, C4, Total complement, ANA, ANCA, RF. If ANA positive, can consider ordering Anti-dsDNA, Anti-Ro, Anti-La, Anti-RNP, anti-Smith  Follow-up Appointments:  Follow-up Information     [place]. Schedule an appointment as soon as possible for a visit.   Contact information: [place] [phone] [place] Hospital Course by problem list: #COPD Exacerbation Patient presented with shortness of breath, cough, and sputum production after only having albuterol inhaler to treat COPD because of cost of inhalers.  He has presented to the ED 7 times over the last 6 months due to shortness of breath.  CXR with no consolidations, no active cardiopulmonary disease. WBC elevated, but likely 2/2 to prednisone use.  He received scheduled Duonebs with improvement in wheezing, started on prednisone 40 mg (finish course 01/05), Incruse Ellipta inhaler and received azithromycin for three days. He was given Incruse, Albuterol inhaler, and remaining 2 doses of prednisone prior to discharge.    #Chronic Hepatitis C Patient reported being diagnosed with hepatitis C but never receiving treatment.  HCV Ab reflex pending, will need treatment if possible.  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 Additional autoimmune workup pending to evaluate for cutaneous vasculitis including Hepatitis B surface antigen/ antibody, Hbc, C3, C4, Total complement, ANA, ANCA, RF.    #Acute Kidney Injury Creatinine elevated at 1.66 on hospital day 1.  UA with specific gravity wnl and no casts, Urine Sodium elevated, Renal US completed and showed small right renal cyst. Thinking AKI likely pre-renal in etiology from decreased PO intake.  He was given 1L LR with improvement in creatinine to 1.38 on day of discharge. Lasix and  lisinopril held on discharge.   #Chronic Systolic and diastolic heart failure #CAD s/p stents to RCA Last cath 2021 with restenosis of RCA. Repeat TTE showed EF of 60-65% with mild left ventricular hypertrophy which is improved from ECHO 04/22.  Lasix held at discharge, he appears euvolemic.   #Peripheral Artery Disease #Hyperlipidemia ABI completed and showed severe small vessel disease.  Lipid panel with LDL of 117. Atorvastatin 40 mg restarted for secondary prevention with goal of LDL<70.   #Hypertension Lisinopril held on discharge 2/2 to AKI.   #Methamphetamine abuse #Tobacco Use Disorder Patient has decreased smoking from 2 packs to 5 cigarettes per day. He last used IV methamphetamine 3 days prior to admission.  Subjective: Patient reports improvement in shortness of breath.  He states that he feels ready to go home, but is worried about how he can get medications.  Discharge Exam:   BP (!) 157/115 (BP Location: Left Arm)    Pulse 90    Temp (!) 97.3 F (36.3 C) (Oral)    Resp 17    SpO2 100%  Discharge exam:  Constitutional: well-appearing, sitting in bed using Incruse inhaler, in  no acute distress HENT: normocephalic atraumatic, mucous membranes moist Eyes: conjunctiva non-erythematous Neck: supple Cardiovascular: regular rate and rhythm, no m/r/g Pulmonary/Chest: normal work of breathing on room air, mild wheezing present in lungs bilaterally Abdominal: soft, non-tender, non-distended MSK: reticular purple hued rash present bilaterally on knees, dusky appearance of feet bilaterally, normal bulk and tone Neurological: alert & oriented x 3 Skin: warm and dry Psych: normal mood and affect  Pertinent Labs, Studies, and Procedures:  CBC Latest Ref Rng & Units 04/21/2021 04/19/2021 04/19/2021  WBC 4.0 - 10.5 K/uL 17.3(H) - 13.0(H)  Hemoglobin 13.0 - 17.0 g/dL 15.7 18.0(H) 17.6(H)  Hematocrit 39.0 - 52.0 % 49.4 53.0(H) 56.5(H)  Platelets 150 - 400 K/uL 322 - 346    BMP Latest  Ref Rng & Units 04/21/2021 04/20/2021 04/19/2021  Glucose 70 - 99 mg/dL 95 125(H) -  BUN 6 - 20 mg/dL 23(H) 25(H) -  Creatinine 0.61 - 1.24 mg/dL 1.38(H) 1.66(H) -  Sodium 135 - 145 mmol/L 141 138 140  Potassium 3.5 - 5.1 mmol/L 4.7 4.7 4.5  Chloride 98 - 111 mmol/L 102 100 -  CO2 22 - 32 mmol/L 32 30 -  Calcium 8.9 - 10.3 mg/dL 9.1 9.6 -    Echo 04/20/21: IMPRESSIONS   1. Left ventricular ejection fraction, by estimation, is 60 to 65%. The  left ventricle has normal function. The left ventricle has no regional  wall motion abnormalities. There is mild left ventricular hypertrophy.  Left ventricular diastolic parameters  are indeterminate.   2. Right ventricular systolic function is normal. The right ventricular  size is normal. Tricuspid regurgitation signal is inadequate for assessing  PA pressure.   3. The mitral valve is normal in structure. No evidence of mitral valve  regurgitation. No evidence of mitral stenosis.   4. The aortic valve was not well visualized. There is mild calcification  of the aortic valve. There is mild thickening of the aortic valve. Aortic  valve regurgitation is not visualized. No aortic stenosis is present.   5. The inferior vena cava is normal in size with greater than 50%  respiratory variability, suggesting right atrial pressure of 3 mmHg.   Renal Ultrasound 01/02: IMPRESSION: 1. No acute findings.  No hydronephrosis. 2. Small right renal cyst.  No other abnormalities.  ABI 04/20/21: Summary:  Right: Resting right ankle-brachial index (PTA) is within normal range  however mild ischemia is noted in DPA. Right TBI and PPG waveforms are  absent indicating severe small vessel disease.  Left: Resting left ankle-brachial index indicates moderate left lower  extremity arterial disease. Left TBI and PPG waveforms are absent  indicating severe small vessel disease.   Discharge Instructions: Discharge Instructions     Diet - low sodium heart healthy    Complete by: As directed    Discharge instructions   Complete by: As directed    Mr. Beau Fanny,  You were recently admitted to [facility] for COPD exacerbation.   Continue taking your home medications with the following changes  Start taking  -Incruse Ellipta 1 puff daily -prednisone 40 mg for two additional days, ending on 01/05. -Metoprolol succinate 25 mg daily Stop taking Lisinopril and Lasix, when you follow-up in the Internal medicine Clinic your kidney function will be rechecked. Continue taking  -Aspirin 81 mg daily -Atorvastatin 40 mg daily   You should seek further medical care if develop worsening shortness of breath or chest pain.  Please follow-up with [facility] on  04/23/2021 at 9:15AM.   Sincerely, Weslee Fogg, DO   Increase activity slowly   Complete by: As directed        Signed: Christiana Fuchs, DO 04/21/2021, 9:51 AM   Pager: [phone]

## 2021-04-21 NOTE — TOC Transition Note (Addendum)
Transition of Care [facility]) - CM/SW Discharge Note   Patient Details  Name: Francisco [name] MRN: [id number] Date of Birth: 11/26/1960  [facility] CM/SW Contact:  Lawerance Sabal, RN Phone Number: 04/21/2021, 9:52 AM   Clinical Narrative:    MATCH provided to [facility] pharmacy. Please ensure that he goes home with prescriptions in hand.  CMA to schedule apt at [facility].  No other TOC needs identified            Patient Goals and CMS Choice        Discharge Placement                       Discharge Plan and Services                                     Social Determinants of Health (SDOH) Interventions     Readmission Risk Interventions Readmission Risk Prevention Plan 08/20/2019  Transportation Screening Complete  PCP or Specialist Appt within 3-5 Days Complete  HRI or Home Care Consult Complete  Social Work Consult for Recovery Care Planning/Counseling Complete  Palliative Care Screening Not Applicable  Medication Review [employer]) Complete  Some recent data might be hidden

## 2021-04-21 NOTE — Progress Notes (Signed)
PRN neb given X3 overnight for wheezing and SOB. Provider evaluated patient at bedside at the beginning of the shift and early this morning as patient was c/o chest tightness, unable to take a deep breaths that was worsen by coughing spells. Suggested continuous pulse ox but provider opted to monitor with routine VS for now as patient during this episode never desaturated. SpO2 remained >92% at spot checks. PRN tessalon pearl ordered for cough.

## 2021-04-21 NOTE — Progress Notes (Signed)
Iv removed, discharge instruction explained to patient. Patient verbalizes understanding. Patient received most of his home meds but had to leave in a hurry, he plans to return tomorrow to the transitional pharmacy to retrieve his nebulizer medication tomorrow.

## 2021-04-21 NOTE — Progress Notes (Deleted)
Note written in error.

## 2021-04-22 LAB — CRYOGLOBULIN

## 2021-04-22 LAB — ANCA PROFILE
Anti-MPO Antibodies: <0.2 units (ref 0.0–0.9)
Anti-PR3 Antibodies: <0.2 units (ref 0.0–0.9)
Atypical P-ANCA titer: <1:20 {titer}
C-ANCA: <1:20 {titer}
P-ANCA: <1:20 {titer}

## 2021-04-23 ENCOUNTER — Other Ambulatory Visit: Payer: Self-pay

## 2021-04-23 ENCOUNTER — Ambulatory Visit (INDEPENDENT_AMBULATORY_CARE_PROVIDER_SITE_OTHER): Payer: Self-pay | Admitting: Internal Medicine

## 2021-04-23 ENCOUNTER — Other Ambulatory Visit (HOSPITAL_COMMUNITY): Payer: Self-pay

## 2021-04-23 ENCOUNTER — Inpatient Hospital Stay: Payer: Self-pay | Admitting: Family Medicine

## 2021-04-23 ENCOUNTER — Encounter: Payer: Self-pay | Admitting: Internal Medicine

## 2021-04-23 ENCOUNTER — Other Ambulatory Visit: Payer: Self-pay | Admitting: Internal Medicine

## 2021-04-23 VITALS — BP 142/91 | HR 100 | Temp 97.8°F | Resp 32 | Ht 72.0 in | Wt 202.4 lb

## 2021-04-23 DIAGNOSIS — I10 Essential (primary) hypertension: Secondary | ICD-10-CM

## 2021-04-23 DIAGNOSIS — I5043 Acute on chronic combined systolic (congestive) and diastolic (congestive) heart failure: Secondary | ICD-10-CM

## 2021-04-23 DIAGNOSIS — J449 Chronic obstructive pulmonary disease, unspecified: Secondary | ICD-10-CM

## 2021-04-23 DIAGNOSIS — I110 Hypertensive heart disease with heart failure: Secondary | ICD-10-CM

## 2021-04-23 DIAGNOSIS — Z5989 Other problems related to housing and economic circumstances: Secondary | ICD-10-CM

## 2021-04-23 LAB — COMPLEMENT, TOTAL: Compl, Total (CH50): >60 U/mL (ref >41)

## 2021-04-23 MED ORDER — FUROSEMIDE 40 MG PO TABS
40.0000 mg | ORAL_TABLET | Freq: Every day | ORAL | 11 refills | Status: DC
  Filled 2021-04-23: qty 30, 30d supply, fill #0
  Filled 2021-06-26 – 2021-07-22 (×2): qty 30, 30d supply, fill #1

## 2021-04-23 MED ORDER — ALBUTEROL SULFATE HFA 108 (90 BASE) MCG/ACT IN AERS
2.0000 | INHALATION_SPRAY | Freq: Four times a day (QID) | RESPIRATORY_TRACT | 2 refills | Status: DC | PRN
  Filled 2021-04-23: qty 18, 25d supply, fill #0
  Filled 2021-05-26: qty 18, 25d supply, fill #1

## 2021-04-23 MED ORDER — FLUTICASONE-SALMETEROL 100-50 MCG/ACT IN AEPB
1.0000 | INHALATION_SPRAY | Freq: Two times a day (BID) | RESPIRATORY_TRACT | 0 refills | Status: DC
  Filled 2021-04-23: qty 60, 30d supply, fill #0

## 2021-04-23 MED ORDER — LISINOPRIL 10 MG PO TABS
10.0000 mg | ORAL_TABLET | Freq: Every day | ORAL | 11 refills | Status: DC
  Filled 2021-04-23: qty 30, 30d supply, fill #0
  Filled 2021-05-26: qty 30, 30d supply, fill #1

## 2021-04-23 MED ORDER — ALBUTEROL SULFATE (2.5 MG/3ML) 0.083% IN NEBU
2.5000 mg | INHALATION_SOLUTION | Freq: Four times a day (QID) | RESPIRATORY_TRACT | 2 refills | Status: DC | PRN
Start: 2021-04-23 — End: 2021-06-24
  Filled 2021-04-23: qty 360, 30d supply, fill #0
  Filled 2021-05-26: qty 300, 25d supply, fill #0

## 2021-04-23 MED ORDER — TIOTROPIUM BROMIDE MONOHYDRATE 18 MCG IN CAPS
18.0000 ug | ORAL_CAPSULE | Freq: Every day | RESPIRATORY_TRACT | 12 refills | Status: DC
  Filled 2021-04-23: qty 30, 30d supply, fill #0
  Filled 2021-05-26: qty 30, 30d supply, fill #1
  Filled 2021-06-26: qty 30, 30d supply, fill #2
  Filled 2021-07-22: qty 30, 30d supply, fill #3
  Filled 2021-08-24: qty 30, 30d supply, fill #4
  Filled 2021-09-23: qty 30, 30d supply, fill #5
  Filled 2021-10-26: qty 30, 30d supply, fill #6
  Filled 2021-12-08: qty 30, 30d supply, fill #7
  Filled 2022-01-01: qty 30, 30d supply, fill #8
  Filled 2022-02-12: qty 30, 30d supply, fill #9
  Filled 2022-03-23: qty 30, 30d supply, fill #10

## 2021-04-23 NOTE — Progress Notes (Addendum)
° °  CC: hospital follow up for COPD exacerbation  HPI:[name] is a 61 y.o. male who presents for evaluation of hospital follow up COPD. Please see individual problem based A/P for details.  Depression, PHQ-9: Based on the patients  Flowsheet Row [facility] from 01/31/2020 in [facility]  PHQ-9 Total Score 13      score we have 13.  Past Medical History:  Diagnosis Date   CHF (congestive heart failure) (HCC)    COPD (chronic obstructive pulmonary disease) (HCC)    Coronary artery disease    a. s/p prior PCI.   Hepatitis-C    Hypertension    IV drug abuse (HCC)    a. 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   Medically noncompliant    MI (myocardial infarction) (HCC)    Tobacco abuse    Review of Systems:   Review of Systems  Constitutional:  Negative for chills and fever.  HENT: Negative.    Respiratory:  Positive for cough, sputum production and shortness of breath. Negative for hemoptysis.   Cardiovascular:  Positive for orthopnea and leg swelling.  Gastrointestinal: Negative.   Genitourinary: Negative.   Musculoskeletal: Negative.   Neurological: Negative.   Psychiatric/Behavioral:  The patient is nervous/anxious.     Physical Exam: Vitals:   04/23/21 0857 04/23/21 0906  BP: (!) 155/87 (!) 142/91  Pulse: (!) 106 100  Resp: (!) 32   Temp: 97.8 F (36.6 C)   TempSrc: Oral   SpO2: 95%   Weight: 202 lb 6.4 oz (91.8 kg)   Height: 6' (1.829 m)      General: alert and oriented, no acute distress HEENT: Conjunctiva nl , antiicteric sclerae, moist mucous membranes, no exudate or erythema Cardiovascular: Normal rate, regular rhythm.  No murmurs, rubs, or gallops Pulmonary : Equal breath sounds, mild wheeze, rales, or rhonchi Abdominal: soft, nontender,  bowel sounds present Ext: Trace edema in lower extremities, no tenderness to palpation of lower extremities.   Assessment & Plan:   See Encounters Tab  for problem based charting.  Patient seen with Dr.  Sol Blazing

## 2021-04-23 NOTE — Patient Instructions (Addendum)
Dear Francisco [name],  We have started you on several new medications. We started you on Spiriva and Advair to help with your COPD.  We would also like for you to restart your lasix 40mg  and lisinopril 10mg .  We will see you back in the clinic in 3 months.

## 2021-04-24 LAB — BMP8+ANION GAP
Anion Gap: 17 mmol/L (ref 10.0–18.0)
BUN/Creatinine Ratio: 16 (ref 10–24)
BUN: 19 mg/dL (ref 8–27)
CO2: 27 mmol/L (ref 20–29)
Calcium: 10.1 mg/dL (ref 8.6–10.2)
Chloride: 101 mmol/L (ref 96–106)
Creatinine, Ser: 1.2 mg/dL (ref 0.76–1.27)
Glucose: 78 mg/dL (ref 70–99)
Potassium: 4 mmol/L (ref 3.5–5.2)
Sodium: 145 mmol/L — ABNORMAL HIGH (ref 134–144)
eGFR: 69 mL/min/{1.73_m2} (ref >59)

## 2021-04-24 LAB — HCV RT-PCR, QUANT (NON-GRAPH)
HCV log10: 6.74 log10 IU/mL
Hepatitis C Quantitation: 5500000 IU/mL

## 2021-04-24 LAB — HCV AB W REFLEX TO QUANT PCR: HCV Ab: >11 s/co ratio — ABNORMAL HIGH (ref 0.0–0.9)

## 2021-04-24 LAB — CULTURE, BLOOD (ROUTINE X 2): Culture: NO GROWTH

## 2021-04-26 DIAGNOSIS — I10 Essential (primary) hypertension: Secondary | ICD-10-CM | POA: Insufficient documentation

## 2021-04-26 NOTE — Assessment & Plan Note (Signed)
Hx of hypertension and previously controlled with lisinopril and lasix. Lisinopril was held during hospitalization due to AKI.  BMP on check up shows resolution of AKI. Potassium 4.0.  BP uncontrolled in 140's.  Restart lisinopril. Will need to follow up in 3 months.

## 2021-04-26 NOTE — Assessment & Plan Note (Addendum)
Lasix held on hospital discharge for AKI. Patient reports some shortness of breath when lying back, but otherwise asymptomatic.  Trace BLE, mild wheeze but absent crackles. Clear sputum production.  Restart lasix 40mg .

## 2021-04-27 ENCOUNTER — Encounter: Payer: Self-pay | Admitting: Internal Medicine

## 2021-04-27 DIAGNOSIS — Z5989 Other problems related to housing and economic circumstances: Secondary | ICD-10-CM | POA: Insufficient documentation

## 2021-04-27 DIAGNOSIS — Z5971 Insufficient health insurance coverage: Secondary | ICD-10-CM | POA: Insufficient documentation

## 2021-04-27 NOTE — Assessment & Plan Note (Signed)
>>  ASSESSMENT AND PLAN FOR COPD (CHRONIC OBSTRUCTIVE PULMONARY DISEASE) (HCC) WRITTEN ON 04/27/2021  7:53 AM BY GRIFFITH PORTER, MD  Has been using rescue inhaler ever day since hospital stay. Significant portion of his shortnesss of breath since discharge relates to anxiety around illness. He does endorse production of scant clear sputum. He is unable to afford refill on Ellipta.  Patient is self pay, advised he start orange card paperwork. Sent prescriptions for Advair  and Spiriva  to [facility]. Refilled rescue inhaler as well.

## 2021-04-27 NOTE — Assessment & Plan Note (Signed)
Patient without coverage. Advised he start orange card process. Needs prescriptions sent to [facility] $4. He is planning on following up with [facility] Feb 10th.

## 2021-04-27 NOTE — Assessment & Plan Note (Addendum)
Has been using rescue inhaler ever day since hospital stay. Significant portion of his shortnesss of breath since discharge relates to anxiety around illness. He does endorse production of scant clear sputum. He is unable to afford refill on Ellipta.  Patient is self pay, advised he start orange card paperwork. Sent prescriptions for Advair and Spiriva to [facility]. Refilled rescue inhaler as well.

## 2021-04-28 ENCOUNTER — Telehealth: Payer: Self-pay | Admitting: *Deleted

## 2021-04-28 ENCOUNTER — Other Ambulatory Visit (HOSPITAL_COMMUNITY): Payer: Self-pay

## 2021-04-28 NOTE — Progress Notes (Signed)
Internal Medicine Clinic Attending  Case discussed with Dr. Elliot Gurney  at the time of the visit.  We reviewed the residents history and exam and pertinent patient test results.  I agree with the assessment, diagnosis, and plan of care documented in the residents note.   Dr. Elliot Gurney also discussed anxiety with the patient.  He should continue to be offered treatment for anxiety related to his medical issues, consider SSRI as first line, possible addition of Buspar.  If he opts to continue following with [place], I would recommend a GAD 7 and a PHQ-9 at next visit and further discussion at that time.  I also advised Dr. Elliot Gurney to provide Francisco [name] with a spacer for his inhaler.

## 2021-04-28 NOTE — Telephone Encounter (Signed)
Call from pt - stated he was seen last week ( 04/23/21) and the doctor changed his medications But his breathing is not any better; unable to go back to work even has difficulty breathing moving around the house. Hx COPD. Audible wheezing noted. Stated he restarted taking lasix and lisinopril and has been using the inhalers and nebs. Has productive cough of clear mucous. Requesting an appt tomorrow am - appt schedule with Dr Lisabeth Devoid @ 0945 Am - PCP does not an open appt in am. Pt instructed to go to UC or ED if his breathing becomes worse prior to his appt - stated he understands.

## 2021-04-29 ENCOUNTER — Ambulatory Visit (INDEPENDENT_AMBULATORY_CARE_PROVIDER_SITE_OTHER): Payer: Self-pay | Admitting: Student

## 2021-04-29 ENCOUNTER — Encounter: Payer: Self-pay | Admitting: Student

## 2021-04-29 ENCOUNTER — Other Ambulatory Visit (HOSPITAL_COMMUNITY): Payer: Self-pay

## 2021-04-29 ENCOUNTER — Other Ambulatory Visit: Payer: Self-pay

## 2021-04-29 VITALS — BP 145/87 | HR 101 | Temp 97.5°F | Ht 72.0 in | Wt 196.7 lb

## 2021-04-29 DIAGNOSIS — F419 Anxiety disorder, unspecified: Secondary | ICD-10-CM

## 2021-04-29 DIAGNOSIS — J441 Chronic obstructive pulmonary disease with (acute) exacerbation: Secondary | ICD-10-CM

## 2021-04-29 DIAGNOSIS — Z720 Tobacco use: Secondary | ICD-10-CM

## 2021-04-29 DIAGNOSIS — Z5989 Other problems related to housing and economic circumstances: Secondary | ICD-10-CM

## 2021-04-29 DIAGNOSIS — J449 Chronic obstructive pulmonary disease, unspecified: Secondary | ICD-10-CM

## 2021-04-29 MED ORDER — IPRATROPIUM-ALBUTEROL 0.5-2.5 (3) MG/3ML IN SOLN
3.0000 mL | Freq: Once | RESPIRATORY_TRACT | Status: AC
  Administered 2021-04-29: 3 mL via RESPIRATORY_TRACT

## 2021-04-29 MED ORDER — NICOTINE 14 MG/24HR TD PT24
14.0000 mg | MEDICATED_PATCH | TRANSDERMAL | 1 refills | Status: DC
  Filled 2021-04-29: qty 28, 28d supply, fill #0

## 2021-04-29 MED ORDER — NICOTINE 14 MG/24HR TD PT24: 14.0000 mg | MEDICATED_PATCH | TRANSDERMAL | 1 refills | Status: DC

## 2021-04-29 MED ORDER — PREDNISONE 10 MG PO TABS: ORAL_TABLET | ORAL | 0 refills | Status: DC

## 2021-04-29 MED ORDER — METHYLPREDNISOLONE SODIUM SUCC 40 MG IJ SOLR: 40.0000 mg | Freq: Once | INTRAMUSCULAR | Status: DC

## 2021-04-29 MED ORDER — NICOTINE POLACRILEX 2 MG MT GUM: 2.0000 mg | CHEWING_GUM | OROMUCOSAL | 0 refills | Status: DC | PRN

## 2021-04-29 MED ORDER — NICOTINE POLACRILEX 2 MG MT GUM
2.0000 mg | CHEWING_GUM | OROMUCOSAL | 0 refills | Status: AC | PRN
Start: 2021-04-29 — End: 2021-07-28
  Filled 2021-04-29: qty 110, 30d supply, fill #0

## 2021-04-29 MED ORDER — PREDNISONE 10 MG PO TABS
ORAL_TABLET | ORAL | 0 refills | Status: AC
  Filled 2021-04-29: qty 28, 10d supply, fill #0

## 2021-04-29 NOTE — Patient Instructions (Addendum)
COPD Please start taking prednisone to help with you shortness of breath  Insurance/Disability I will talk with our social worker to help you with the paperwork  Tobacco Use: Please start using nicotine patches to help with smoking cessation, this should also help with you breathing

## 2021-05-01 DIAGNOSIS — F419 Anxiety disorder, unspecified: Secondary | ICD-10-CM | POA: Insufficient documentation

## 2021-05-01 DIAGNOSIS — F411 Generalized anxiety disorder: Secondary | ICD-10-CM | POA: Insufficient documentation

## 2021-05-01 NOTE — Assessment & Plan Note (Signed)
Patient presents for increased dyspnea, wheezing, cough since his last visit on 04/23/2021.  Like his cough continues to worsen with this white sputum production.  He has been using Advair, Spiriva, Incruse as prescribed.  Has additionally been using his albuterol 3-4 times a day due to continued shortness of breath.  On exam he is diffusely wheezing.  No significant desaturations below 90% on ambulation in office today.  Does not seem to be in respiratory distress.  No tripoding or use of accessory muscles.  He denies any fevers, chills.  It seems like his wheezing has worsened and symptoms have persisted since his last visit suspect he may have new COPD exacerbation.  Was given a nebulizer treatment and Solu-Medrol in office today with mild improvement of wheezing although still continues had fairly significant wheezing on lung auscultation.  Plan Start prednisone 40 mg daily we will do 10-day taper given prolonged dyspnea and worsened wheezing on exam today Continue Incruse, Spiriva, Advair Does not appear PFTs have been done in the past, patient is self-pay and working on orange card paperwork and has questions about filling it out we will place referral for social work Continue albuterol as needed for shortness of breath

## 2021-05-01 NOTE — Assessment & Plan Note (Signed)
Patient continues to smoke about 5 cigarettes daily.  Patient would be interested in smoking cessation.  Tried Chantix in the past but had significant only vivid dreams.  No concern for anxiety at his last visit will not try him on Wellbutrin at this time.  He is interested in nicotine patches we will start with a 40 mg patch for 6 weeks and then titrate down in addition to nicotine gum.  We will continue to discuss smoking social cessation with him.  We will need to further elaborate smoking history may qualify for lung cancer screening once symptoms improved.

## 2021-05-01 NOTE — Assessment & Plan Note (Signed)
>>  ASSESSMENT AND PLAN FOR TOBACCO ABUSE WRITTEN ON 05/01/2021  1:52 PM BY LEMON RAISIN, MD  Patient continues to smoke about 5 cigarettes daily.  Patient would be interested in smoking cessation.  Tried Chantix in the past but had significant only vivid dreams.  No concern for anxiety at his last visit will not try him on Wellbutrin at this time.  He is interested in nicotine  patches we will start with a 40 mg patch for 6 weeks and then titrate down in addition to nicotine  gum.  We will continue to discuss smoking social cessation with him.  We will need to further elaborate smoking history may qualify for lung cancer screening once symptoms improved.

## 2021-05-01 NOTE — Assessment & Plan Note (Signed)
Per his last office visit there was concern that patient's anxiety may be playing into his symptoms of dyspnea.  GAD of 4 at office visit today.  He denies feeling significant anxiety about being short of breath today.  But does endorse feeling concerned that he is not able to work due to his symptoms.  PHQ-9 was elevated today but patient denies symptoms of depression and is not interested in further management of this.

## 2021-05-01 NOTE — Assessment & Plan Note (Signed)
>>  ASSESSMENT AND PLAN FOR COPD (CHRONIC OBSTRUCTIVE PULMONARY DISEASE) (HCC) WRITTEN ON 05/01/2021  1:49 PM BY LEMON RAISIN, MD  Patient presents for increased dyspnea, wheezing, cough since his last visit on 04/23/2021.  Like his cough continues to worsen with this white sputum production.  He has been using Advair , Spiriva , Incruse as prescribed.  Has additionally been using his albuterol  3-4 times a day due to continued shortness of breath.  On exam he is diffusely wheezing.  No significant desaturations below 90% on ambulation in office today.  Does not seem to be in respiratory distress.  No tripoding or use of accessory muscles.  He denies any fevers, chills.  It seems like his wheezing has worsened and symptoms have persisted since his last visit suspect he may have new COPD exacerbation.  Was given a nebulizer treatment and Solu-Medrol  in office today with mild improvement of wheezing although still continues had fairly significant wheezing on lung auscultation.  Plan Start prednisone  40 mg daily we will do 10-day taper given prolonged dyspnea and worsened wheezing on exam today Continue Incruse, Spiriva , Advair  Does not appear PFTs have been done in the past, patient is self-pay and working on orange card paperwork and has questions about filling it out we will place referral for social work Continue albuterol  as needed for shortness of breath

## 2021-05-01 NOTE — Progress Notes (Addendum)
° °  CC: Follow-up of COPD exacerbation  HPI:  [name] is a 61 y.o. with history listed below presents for follow up of COPD exacerbation. Was admitted on 1/1 and discharged on 1/3 follow-up with Dr. Burnice Logan on 1/5 noted to have improvement in his shortness of breath and wheezing on exam. Please refer to problem based charting for further details and assessment and plan of current problem and chronic medical conditions.   Past Medical History:  Diagnosis Date   CHF (congestive heart failure) (HCC)    COPD (chronic obstructive pulmonary disease) (HCC)    Coronary artery disease    a. s/p prior PCI.   Hepatitis-C    Hypertension    IV drug abuse (HCC)    a. 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   Medically noncompliant    MI (myocardial infarction) (HCC)    Tobacco abuse    Review of Systems: Negative as per HPI  Physical Exam:  Vitals:   04/29/21 0837 04/29/21 0848  BP: (!) 164/86 (!) 145/87  Pulse: (!) 106 (!) 101  Temp: (!) 97.5 F (36.4 C)   TempSrc: Oral   SpO2: 95%   Weight: 196 lb 11.2 oz (89.2 kg)   Height: 6' (1.829 m)    Physical Exam Constitutional:      General: He is not in acute distress.    Appearance: Normal appearance.  HENT:     Head: Normocephalic and atraumatic.     Nose: Nose normal.     Mouth/Throat:     Mouth: Mucous membranes are moist.     Pharynx: Oropharynx is clear.  Eyes:     Extraocular Movements: Extraocular movements intact.     Pupils: Pupils are equal, round, and reactive to light.  Cardiovascular:     Rate and Rhythm: Regular rhythm. Tachycardia present.     Pulses: Normal pulses.  Pulmonary:     Breath sounds: Wheezing present.     Comments: No tripoding, breath sounds present to the bases bilaterally, no use of accessory muscles.  No crackles Abdominal:     General: Abdomen is flat. Bowel sounds are normal. There is no distension.     Palpations: Abdomen is soft.     Tenderness: There is no abdominal  tenderness.  Skin:    General: Skin is warm and dry.     Comments: Normal skin turgor  Neurological:     General: No focal deficit present.     Mental Status: He is alert and oriented to person, place, and time.     Assessment & Plan:   See Encounters Tab for problem based charting.  Patient discussed with Dr. Mayford Knife

## 2021-05-04 ENCOUNTER — Telehealth: Payer: Self-pay | Admitting: *Deleted

## 2021-05-04 NOTE — Chronic Care Management (AMB) (Signed)
°  Care Management   Outreach Note  05/04/2021 Name: Francisco [name] MRN: [id number] DOB: Mar 22, 1961  Referred by: Delene Ruffini, MD Reason for referral : Care Coordination (Initial outreach to schedule referral with BSW)   An unsuccessful telephone outreach was attempted today. The patient was referred to the case management team for assistance with care management and care coordination.   Follow Up Plan:  A HIPAA compliant phone message was left for the patient providing contact information and requesting a return call.  The care management team will reach out to the patient again over the next 7 days.  If patient returns call to provider office, please advise to call Ernest* at [phone].*  [facility]  Direct Dial: [phone]

## 2021-05-06 ENCOUNTER — Other Ambulatory Visit: Payer: Self-pay | Admitting: Physician Assistant

## 2021-05-06 DIAGNOSIS — I10 Essential (primary) hypertension: Secondary | ICD-10-CM

## 2021-05-11 NOTE — Chronic Care Management (AMB) (Signed)
°  Care Management   Outreach Note  05/11/2021 Name: Francisco [name] MRN: [id number] DOB: 04-Jan-1961  Referred by: Adron Bene, MD Reason for referral : Care Coordination (Initial outreach to schedule referral with BSW)   A second unsuccessful telephone outreach was attempted today. The patient was referred to the case management team for assistance with care management and care coordination.   Follow Up Plan:  A HIPAA compliant phone message was left for the patient providing contact information and requesting a return call.  The care management team will reach out to the patient again over the next 7 days.  If patient returns call to provider office, please advise to call Embedded Care Management Care Guide Misty Stanley* at [phone].Gwenevere Ghazi  Care Guide, Embedded Care Coordination [facility]  Direct Dial: [phone]

## 2021-05-12 ENCOUNTER — Emergency Department (HOSPITAL_COMMUNITY): Payer: Self-pay

## 2021-05-12 ENCOUNTER — Encounter (HOSPITAL_COMMUNITY): Payer: Self-pay | Admitting: Emergency Medicine

## 2021-05-12 ENCOUNTER — Inpatient Hospital Stay (HOSPITAL_COMMUNITY)
Admission: EM | Admit: 2021-05-12 | Discharge: 2021-05-14 | DRG: 190 | Disposition: A | Discharge status: Home / Self Care | Payer: Self-pay | Attending: Internal Medicine | Admitting: Internal Medicine

## 2021-05-12 DIAGNOSIS — J189 Pneumonia, unspecified organism: Secondary | ICD-10-CM | POA: Diagnosis present

## 2021-05-12 DIAGNOSIS — Z20822 Contact with and (suspected) exposure to covid-19: Secondary | ICD-10-CM | POA: Diagnosis present

## 2021-05-12 DIAGNOSIS — R918 Other nonspecific abnormal finding of lung field: Secondary | ICD-10-CM | POA: Diagnosis present

## 2021-05-12 DIAGNOSIS — E785 Hyperlipidemia, unspecified: Secondary | ICD-10-CM | POA: Diagnosis present

## 2021-05-12 DIAGNOSIS — Z8249 Family history of ischemic heart disease and other diseases of the circulatory system: Secondary | ICD-10-CM

## 2021-05-12 DIAGNOSIS — J440 Chronic obstructive pulmonary disease with acute lower respiratory infection: Secondary | ICD-10-CM | POA: Diagnosis present

## 2021-05-12 DIAGNOSIS — I5042 Chronic combined systolic (congestive) and diastolic (congestive) heart failure: Secondary | ICD-10-CM | POA: Diagnosis present

## 2021-05-12 DIAGNOSIS — F17210 Nicotine dependence, cigarettes, uncomplicated: Secondary | ICD-10-CM | POA: Diagnosis present

## 2021-05-12 DIAGNOSIS — I420 Dilated cardiomyopathy: Secondary | ICD-10-CM | POA: Diagnosis present

## 2021-05-12 DIAGNOSIS — I2510 Atherosclerotic heart disease of native coronary artery without angina pectoris: Secondary | ICD-10-CM | POA: Diagnosis present

## 2021-05-12 DIAGNOSIS — Y95 Nosocomial condition: Secondary | ICD-10-CM | POA: Diagnosis present

## 2021-05-12 DIAGNOSIS — F1510 Other stimulant abuse, uncomplicated: Secondary | ICD-10-CM | POA: Diagnosis present

## 2021-05-12 DIAGNOSIS — Z885 Allergy status to narcotic agent status: Secondary | ICD-10-CM

## 2021-05-12 DIAGNOSIS — Z79899 Other long term (current) drug therapy: Secondary | ICD-10-CM

## 2021-05-12 DIAGNOSIS — B182 Chronic viral hepatitis C: Secondary | ICD-10-CM | POA: Diagnosis present

## 2021-05-12 DIAGNOSIS — I739 Peripheral vascular disease, unspecified: Secondary | ICD-10-CM | POA: Diagnosis present

## 2021-05-12 DIAGNOSIS — I252 Old myocardial infarction: Secondary | ICD-10-CM

## 2021-05-12 DIAGNOSIS — Z91041 Radiographic dye allergy status: Secondary | ICD-10-CM

## 2021-05-12 DIAGNOSIS — I110 Hypertensive heart disease with heart failure: Secondary | ICD-10-CM | POA: Diagnosis present

## 2021-05-12 DIAGNOSIS — Z955 Presence of coronary angioplasty implant and graft: Secondary | ICD-10-CM

## 2021-05-12 DIAGNOSIS — J441 Chronic obstructive pulmonary disease with (acute) exacerbation: Principal | ICD-10-CM | POA: Diagnosis present

## 2021-05-12 LAB — TROPONIN I (HIGH SENSITIVITY)
Troponin I (High Sensitivity): 34 ng/L — ABNORMAL HIGH (ref <18)
Troponin I (High Sensitivity): 51 ng/L — ABNORMAL HIGH (ref <18)

## 2021-05-12 LAB — HEPATIC FUNCTION PANEL
ALT: 70 U/L — ABNORMAL HIGH (ref 0–44)
AST: 38 U/L (ref 15–41)
Albumin: 3.6 g/dL (ref 3.5–5.0)
Alkaline Phosphatase: 57 U/L (ref 38–126)
Bilirubin, Direct: 0.3 mg/dL — ABNORMAL HIGH (ref 0.0–0.2)
Indirect Bilirubin: 1.2 mg/dL — ABNORMAL HIGH (ref 0.3–0.9)
Total Bilirubin: 1.5 mg/dL — ABNORMAL HIGH (ref 0.3–1.2)
Total Protein: 6.7 g/dL (ref 6.5–8.1)

## 2021-05-12 LAB — BASIC METABOLIC PANEL
Anion gap: 15 (ref 5–15)
BUN: 15 mg/dL (ref 6–20)
CO2: 27 mmol/L (ref 22–32)
Calcium: 9.6 mg/dL (ref 8.9–10.3)
Chloride: 97 mmol/L — ABNORMAL LOW (ref 98–111)
Creatinine, Ser: 1.27 mg/dL — ABNORMAL HIGH (ref 0.61–1.24)
GFR, Estimated: >60 mL/min (ref >60)
Glucose, Bld: 120 mg/dL — ABNORMAL HIGH (ref 70–99)
Potassium: 4.1 mmol/L (ref 3.5–5.1)
Sodium: 139 mmol/L (ref 135–145)

## 2021-05-12 LAB — CBC
HCT: 49 % (ref 39.0–52.0)
Hemoglobin: 16.3 g/dL (ref 13.0–17.0)
MCH: 32.4 pg (ref 26.0–34.0)
MCHC: 33.3 g/dL (ref 30.0–36.0)
MCV: 97.4 fL (ref 80.0–100.0)
Platelets: 254 10*3/uL (ref 150–400)
RBC: 5.03 MIL/uL (ref 4.22–5.81)
RDW: 13.3 % (ref 11.5–15.5)
WBC: 21.6 10*3/uL — ABNORMAL HIGH (ref 4.0–10.5)
nRBC: 0 % (ref 0.0–0.2)

## 2021-05-12 LAB — BRAIN NATRIURETIC PEPTIDE: B Natriuretic Peptide: 543.1 pg/mL — ABNORMAL HIGH (ref 0.0–100.0)

## 2021-05-12 LAB — LACTIC ACID, PLASMA: Lactic Acid, Venous: 1.1 mmol/L (ref 0.5–1.9)

## 2021-05-12 MED ORDER — ACETAMINOPHEN 325 MG PO TABS
650.0000 mg | ORAL_TABLET | Freq: Once | ORAL | Status: AC
Start: 2021-05-12 — End: 2021-05-12
  Administered 2021-05-12: 18:00:00 650 mg via ORAL
  Filled 2021-05-12: qty 2

## 2021-05-12 MED ORDER — VANCOMYCIN HCL IN DEXTROSE 1-5 GM/200ML-% IV SOLN
1000.0000 mg | Freq: Two times a day (BID) | INTRAVENOUS | Status: DC
  Filled 2021-05-12: qty 200

## 2021-05-12 MED ORDER — SODIUM CHLORIDE 0.9 % IV BOLUS
1000.0000 mL | Freq: Once | INTRAVENOUS | Status: AC
  Administered 2021-05-12: 1000 mL via INTRAVENOUS

## 2021-05-12 MED ORDER — FUROSEMIDE 40 MG PO TABS
40.0000 mg | ORAL_TABLET | Freq: Every day | ORAL | Status: DC
  Administered 2021-05-13 – 2021-05-14 (×2): 40 mg via ORAL
  Filled 2021-05-12: qty 1
  Filled 2021-05-12: qty 2

## 2021-05-12 MED ORDER — UMECLIDINIUM BROMIDE 62.5 MCG/ACT IN AEPB
1.0000 | INHALATION_SPRAY | Freq: Every day | RESPIRATORY_TRACT | Status: DC
  Administered 2021-05-13 – 2021-05-14 (×2): 1 via RESPIRATORY_TRACT
  Filled 2021-05-12: qty 7

## 2021-05-12 MED ORDER — ATORVASTATIN CALCIUM 40 MG PO TABS
40.0000 mg | ORAL_TABLET | Freq: Every day | ORAL | Status: DC
  Administered 2021-05-13 – 2021-05-14 (×2): 40 mg via ORAL
  Filled 2021-05-12 (×2): qty 1

## 2021-05-12 MED ORDER — METOPROLOL SUCCINATE ER 25 MG PO TB24
25.0000 mg | ORAL_TABLET | Freq: Every day | ORAL | Status: DC
  Administered 2021-05-13 – 2021-05-14 (×2): 25 mg via ORAL
  Filled 2021-05-12 (×2): qty 1

## 2021-05-12 MED ORDER — ASPIRIN EC 81 MG PO TBEC
81.0000 mg | DELAYED_RELEASE_TABLET | Freq: Every day | ORAL | Status: DC
  Administered 2021-05-13 – 2021-05-14 (×2): 81 mg via ORAL
  Filled 2021-05-12 (×2): qty 1

## 2021-05-12 MED ORDER — LISINOPRIL 10 MG PO TABS
10.0000 mg | ORAL_TABLET | Freq: Every day | ORAL | Status: DC
  Administered 2021-05-13 – 2021-05-14 (×2): 10 mg via ORAL
  Filled 2021-05-12 (×2): qty 1

## 2021-05-12 MED ORDER — MOMETASONE FURO-FORMOTEROL FUM 100-5 MCG/ACT IN AERO
2.0000 | INHALATION_SPRAY | Freq: Two times a day (BID) | RESPIRATORY_TRACT | Status: DC
  Administered 2021-05-13 – 2021-05-14 (×3): 2 via RESPIRATORY_TRACT
  Filled 2021-05-12: qty 8.8

## 2021-05-12 MED ORDER — IPRATROPIUM-ALBUTEROL 0.5-2.5 (3) MG/3ML IN SOLN
3.0000 mL | Freq: Once | RESPIRATORY_TRACT | Status: AC
  Administered 2021-05-12: 23:00:00 3 mL via RESPIRATORY_TRACT
  Filled 2021-05-12: qty 3

## 2021-05-12 MED ORDER — VANCOMYCIN HCL 1750 MG/350ML IV SOLN
1750.0000 mg | Freq: Once | INTRAVENOUS | Status: AC
  Administered 2021-05-13: 1750 mg via INTRAVENOUS
  Filled 2021-05-12: qty 350

## 2021-05-12 MED ORDER — ALBUTEROL SULFATE (2.5 MG/3ML) 0.083% IN NEBU
2.5000 mg | INHALATION_SOLUTION | Freq: Four times a day (QID) | RESPIRATORY_TRACT | Status: DC
  Administered 2021-05-13 (×4): 2.5 mg via RESPIRATORY_TRACT
  Filled 2021-05-12 (×6): qty 3

## 2021-05-12 MED ORDER — SODIUM CHLORIDE 0.9 % IV SOLN
2.0000 g | Freq: Once | INTRAVENOUS | Status: AC
  Administered 2021-05-12: 23:00:00 2 g via INTRAVENOUS
  Filled 2021-05-12: qty 2

## 2021-05-12 MED ORDER — ALBUTEROL SULFATE HFA 108 (90 BASE) MCG/ACT IN AERS
2.0000 | INHALATION_SPRAY | RESPIRATORY_TRACT | Status: DC | PRN
  Filled 2021-05-12: qty 6.7

## 2021-05-12 MED ORDER — ALBUTEROL SULFATE (2.5 MG/3ML) 0.083% IN NEBU: 2.5000 mg | INHALATION_SOLUTION | Freq: Four times a day (QID) | RESPIRATORY_TRACT | Status: DC | PRN

## 2021-05-12 NOTE — ED Provider Notes (Signed)
[place] EMERGENCY DEPARTMENT Provider Note   CSN: [id number] Arrival date & time: 05/12/21  1743     History  Chief Complaint  Patient presents with   Shortness of Breath   Chest Pain    Francisco [name] is a 61 y.o. male.  HPI  Patient with medical history including CAD right heart cath 2021 hepatitis C CAD COPD hypertension presents with complaints of COPD exacerbation.  started yesterday morning, states she woke up feeling more short of breath, has associated fevers, chills, and productive cough.  He states that he has some chest pressure he has no actual chest pain denies pleuritic chest pain no peripheral edema, no orthopnea, he states that he feels more short of breath with exertion states he gets more winded, he tried to go to work yesterday but was unable to.  He has been taking his home medication without much relief, he currently finished dose of steroids.  He denies any leaving or aggravating factors.  He still smokes half pack of cigarettes.  After  reviewing patient's chart been seen multiple times for COPD exacerbation was most recent discharge on 1/03 started on 5 days of steroids, later follow-up with his PCP concern for possible worsening COPD exacerbation started on 10 days tapered steroid dose completed 2 days prior, he has been taking his albuterol, Advair, Spiriva without much relief.  Home Medications Prior to Admission medications   Medication Sig Start Date End Date Taking? Authorizing Provider  acetaminophen (TYLENOL) 325 MG tablet Take 650 mg by mouth every 6 (six) hours as needed for moderate pain or headache.   Yes [provider]  albuterol (PROVENTIL) (2.5 MG/3ML) 0.083% nebulizer solution Use 1 vial (2.5 mg total) by nebulization every 6 (six) hours as needed for wheezing or shortness of breath. 04/23/21  Yes Delene Ruffini, MD  albuterol (VENTOLIN HFA) 108 (90 Base) MCG/ACT inhaler Inhale 2 puffs into the lungs every 6 (six)  hours as needed for wheezing or shortness of breath 04/23/21  Yes Delene Ruffini, MD  aspirin 81 MG EC tablet Take 1 tablet (81 mg total) by mouth daily. 04/21/21 05/21/21 Yes Masters, Katie, DO  atorvastatin (LIPITOR) 40 MG tablet Take 1 tablet (40 mg total) by mouth daily. 04/21/21  Yes Masters, Katie, DO  fluticasone-salmeterol (ADVAIR DISKUS) 100-50 MCG/ACT AEPB Inhale 1 puff into the lungs 2 (two) times daily. 04/23/21 05/23/21 Yes Delene Ruffini, MD  furosemide (LASIX) 40 MG tablet Take 1 tablet (40 mg total) by mouth daily. 04/23/21  Yes Delene Ruffini, MD  lisinopril (ZESTRIL) 10 MG tablet Take 1 tablet (10 mg total) by mouth daily. 04/23/21  Yes Delene Ruffini, MD  metoprolol succinate (TOPROL-XL) 25 MG 24 hr tablet Take 1 tablet (25 mg total) by mouth in the morning. 04/21/21 05/21/21 Yes Masters, Katie, DO  nitroGLYCERIN (NITROSTAT) 0.4 MG SL tablet Place 1 tablet (0.4 mg total) under the tongue every 5 (five) minutes x 3 doses as needed for chest pain. 04/21/21  Yes Masters, Katie, DO  tiotropium (SPIRIVA) 18 MCG inhalation capsule Place 1 capsule (18 mcg total) into inhaler and inhale daily. 04/23/21  Yes Delene Ruffini, MD  umeclidinium bromide (INCRUSE ELLIPTA) 62.5 MCG/ACT AEPB Inhale 1 puff into the lungs daily. 04/22/21  Yes Masters, Katie, DO  nicotine (NICODERM CQ - DOSED IN MG/24 HOURS) 14 mg/24hr patch Place 1 patch (14 mg total) onto the skin daily. 04/29/21   Iona Beard, MD  nicotine polacrilex (NICORETTE) 2 MG gum Chew 1 piece (  2 mg total) of gum as needed and as directed for smoking cessation. Patient not taking: Reported on 05/12/2021 04/29/21 07/28/21  Iona Beard, MD      Allergies    Ivp dye [iodinated contrast media] and Tramadol    Review of Systems   Review of Systems  Constitutional:  Positive for chills and fever.  HENT:  Negative for sore throat.   Respiratory:  Positive for cough and chest tightness. Negative for shortness of breath.   Cardiovascular:  Negative for  chest pain.  Gastrointestinal:  Negative for abdominal pain, diarrhea, nausea and vomiting.  Neurological:  Negative for headaches.   Physical Exam Updated Vital Signs BP (!) 147/92    Pulse 100    Temp (!) 100.7 F (38.2 C) (Oral)    Resp (!) 21    Ht 6' (1.829 m)    Wt 89 kg    SpO2 95%    BMI 26.61 kg/m  Physical Exam Vitals and nursing note reviewed.  Constitutional:      General: He is not in acute distress.    Appearance: He is not ill-appearing.  HENT:     Head: Normocephalic and atraumatic.     Right Ear: Tympanic membrane, ear canal and external ear normal.     Left Ear: Tympanic membrane, ear canal and external ear normal.     Nose: No congestion.     Mouth/Throat:     Mouth: Mucous membranes are moist.     Pharynx: Oropharynx is clear. No oropharyngeal exudate or posterior oropharyngeal erythema.  Eyes:     Conjunctiva/sclera: Conjunctivae normal.  Cardiovascular:     Rate and Rhythm: Regular rhythm. Tachycardia present.     Pulses: Normal pulses.     Heart sounds: No murmur heard.   No friction rub. No gallop.  Pulmonary:     Breath sounds: Wheezing present. No rales.     Comments: Patient is not showing signs of respite distress he is nontachypneic, nonhypoxic, O2 sats low 90s on room air, he is able to speak in full sentences, lung sounds were assessed tight sounding chest slightly diminished in the lower lobes bilaterally, expiratory wheezing present no rales or rhonchi noted. Chest:     Chest wall: No tenderness.  Abdominal:     Palpations: Abdomen is soft.     Tenderness: There is no abdominal tenderness. There is no right CVA tenderness or left CVA tenderness.  Musculoskeletal:     Right lower leg: No edema.     Left lower leg: No edema.  Skin:    General: Skin is warm and dry.  Neurological:     Mental Status: He is alert.  Psychiatric:        Mood and Affect: Mood normal.    ED Results / Procedures / Treatments   Labs (all labs ordered are listed,  but only abnormal results are displayed) Labs Reviewed  BASIC METABOLIC PANEL - Abnormal; Notable for the following components:      Result Value   Chloride 97 (*)    Glucose, Bld 120 (*)    Creatinine, Ser 1.27 (*)    All other components within normal limits  CBC - Abnormal; Notable for the following components:   WBC 21.6 (*)    All other components within normal limits  BRAIN NATRIURETIC PEPTIDE - Abnormal; Notable for the following components:   B Natriuretic Peptide 543.1 (*)    All other components within normal limits  HEPATIC FUNCTION PANEL -  Abnormal; Notable for the following components:   ALT 70 (*)    Total Bilirubin 1.5 (*)    Bilirubin, Direct 0.3 (*)    Indirect Bilirubin 1.2 (*)    All other components within normal limits  TROPONIN I (HIGH SENSITIVITY) - Abnormal; Notable for the following components:   Troponin I (High Sensitivity) 34 (*)    All other components within normal limits  TROPONIN I (HIGH SENSITIVITY) - Abnormal; Notable for the following components:   Troponin I (High Sensitivity) 51 (*)    All other components within normal limits  RESP PANEL BY RT-PCR (FLU A&B, COVID) ARPGX2  CULTURE, BLOOD (ROUTINE X 2)  CULTURE, BLOOD (ROUTINE X 2)  URINE CULTURE  MRSA NEXT GEN BY PCR, NASAL  LACTIC ACID, PLASMA  RAPID URINE DRUG SCREEN, HOSP PERFORMED  URINALYSIS, ROUTINE W REFLEX MICROSCOPIC  COMPREHENSIVE METABOLIC PANEL  CBC    EKG EKG Interpretation  Date/Time:  Tuesday May 12 2021 18:08:52 EST Ventricular Rate:  117 PR Interval:  140 QRS Duration: 88 QT Interval:  308 QTC Calculation: 429 R Axis:   74 Text Interpretation: Sinus tachycardia Right atrial enlargement ST & T wave abnormality, consider inferolateral ischemia Abnormal ECG When compared with ECG of 19-Apr-2021 23:11, PREVIOUS ECG IS PRESENT similar to previous Confirmed by Coralee Pesa [phone]) on 05/12/2021 10:52:00 PM  Radiology DG Chest 2 View  Addendum Date: 05/12/2021    ADDENDUM REPORT: 05/12/2021 19:07 ADDENDUM: Follow-up for potential RIGHT lower lobe nodule and presence of suspected bilateral basilar pneumonia was discussed as outlined below. These results were called by telephone at the time of interpretation on 05/12/2021 at 7:07 pm to provider ADAM CURATOLO , who verbally acknowledged these results. Electronically Signed   By: Donzetta Kohut M.D.   On: 05/12/2021 19:07   Result Date: 05/12/2021 CLINICAL DATA:  Shortness of breath. EXAM: CHEST - 2 VIEW COMPARISON:  Comparison is made with April 19, 2021. FINDINGS: Small pleural effusion and LEFT basilar airspace disease. Vague opacity also in the RIGHT chest in the RIGHT lower chest measuring up to 2 cm. Increased density along the RIGHT inferior heart border as well since prior imaging Mediastinal contours and hilar structures are otherwise stable. No visible pneumothorax On limited assessment there is no acute skeletal process. IMPRESSION: 1. Bibasilar airspace disease potential small LEFT pleural effusion, this has developed since prior imaging. Findings suspicious for bilateral pneumonia, given distribution would also consider the possibility of aspiration. Correlate with any risk factors for aspiration. 2. Vague nodular opacity up to 2 cm in the RIGHT lower chest could potentially represent confluence of shadows or infection given additional findings seen on the current study. Given the nodular appearance in the RIGHT lower lobe would suggest 6-12 week follow-up after resolution of symptoms to exclude developing nodule in this location. Electronically Signed: By: Donzetta Kohut M.D. On: 05/12/2021 18:59    Procedures .Critical Care Performed by: Carroll Sage, PA-C Authorized by: Carroll Sage, PA-C   Critical care provider statement:    Critical care time (minutes):  30   Critical care time was exclusive of:  Separately billable procedures and treating other patients   Critical care was  necessary to treat or prevent imminent or life-threatening deterioration of the following conditions:  Sepsis   Critical care was time spent personally by me on the following activities:  Evaluation of patient's response to treatment, examination of patient, ordering and review of laboratory studies, ordering and review of radiographic studies, ordering  and performing treatments and interventions, pulse oximetry, re-evaluation of patient's condition, review of old charts and discussions with consultants   I assumed direction of critical care for this patient from another provider in my specialty: no     Care discussed with: admitting provider      Medications Ordered in ED Medications  vancomycin (VANCOREADY) IVPB 1750 mg/350 mL (1,750 mg Intravenous New Bag/Given 05/13/21 0000)  enoxaparin (LOVENOX) injection 40 mg (has no administration in time range)  nicotine (NICODERM CQ - dosed in mg/24 hours) patch 14 mg (has no administration in time range)  aspirin EC tablet 81 mg (has no administration in time range)  atorvastatin (LIPITOR) tablet 40 mg (has no administration in time range)  mometasone-formoterol (DULERA) 100-5 MCG/ACT inhaler 2 puff (has no administration in time range)  furosemide (LASIX) tablet 40 mg (has no administration in time range)  lisinopril (ZESTRIL) tablet 10 mg (has no administration in time range)  metoprolol succinate (TOPROL-XL) 24 hr tablet 25 mg (has no administration in time range)  umeclidinium bromide (INCRUSE ELLIPTA) 62.5 MCG/ACT 1 puff (has no administration in time range)  albuterol (PROVENTIL) (2.5 MG/3ML) 0.083% nebulizer solution 2.5 mg (has no administration in time range)  azithromycin (ZITHROMAX) tablet 500 mg (has no administration in time range)  cefTRIAXone (ROCEPHIN) 1 g in sodium chloride 0.9 % 100 mL IVPB (has no administration in time range)  acetaminophen (TYLENOL) tablet 650 mg (650 mg Oral Given 05/12/21 1829)  ipratropium-albuterol (DUONEB)  0.5-2.5 (3) MG/3ML nebulizer solution 3 mL (3 mLs Nebulization Given 05/12/21 2253)  ceFEPIme (MAXIPIME) 2 g in sodium chloride 0.9 % 100 mL IVPB (0 g Intravenous Stopped 05/12/21 2340)  sodium chloride 0.9 % bolus 1,000 mL (1,000 mLs Intravenous New Bag/Given 05/12/21 2357)    ED Course/ Medical Decision Making/ A&P                           Medical Decision Making Amount and/or Complexity of Data Reviewed Labs: ordered. Radiology: ordered.  Risk Prescription drug management. Decision regarding hospitalization.   This patient presents to the ED for concern of shortness of breath, this involves an extensive number of treatment options, and is a complaint that carries with it a high risk of complications and morbidity.  The differential diagnosis includes COPD exacerbation, pneumonia, PE    Additional history obtained:  Additional history obtained from electronic medical record External records from outside source obtained and reviewed including previous discharge please see HPI   Co morbidities that complicate the patient evaluation  COPD, CAD, CHF  Social Determinants of Health:  Tobacco dependent    Lab Tests:  I Ordered, and personally interpreted labs.  The pertinent results include: CBC shows leukocytosis of 21.63 weeks ago was 17, BMP shows chloride of 97 glucose 127 creatinine 1.2, BMP shows 543 please be at baseline, for troponin is 34 baseline appears to be around 60   Imaging Studies ordered:  I ordered imaging studies including chest x-ray I independently visualized and interpreted imaging which showed airspace opacities bilateral lobes concern for possible bilateral pneumonia, left pleural effusion vague nodule obesity 2 cm in the right lower chest possible infection or malignancy  I agree with the radiologist interpretation   Cardiac Monitoring:  The patient was maintained on a cardiac monitor.  I personally viewed and interpreted the cardiac monitored  which showed an underlying rhythm of: Sinus tach without signs of ischemia   Medicines ordered and prescription  drug management:  I ordered medication including fluids, DuoNeb for dehydrated, COPD exacerbation I have reviewed the patients home medicines and have made adjustments as needed  Critical Interventions:  Patient meets sepsis criteria elevated white count tachycardic likely source bilateral pneumonia seen on chest x-ray recently discharged 2 weeks ago for possible treat him for hospital-acquired pneumonia.  Will provide fluids.   Reevaluation:  On my exam patient's was very tight sounding, likely 7 from a COPD exacerbation will prime with DuoNeb continue to monitor.  Patient started on fluids, antibiotics, has no complaints this time, lung sounds reassessed after DuoNeb they have improved still slightly tight sounding, showed no signs of respiratory distress.  Will recommend admission, patient agreement with  this plan will consult hospitalist team.    Consultations Obtained:  I requested consultation with the hospitalist Dr. Posey Pronto,  and discussed lab and imaging findings as well as pertinent plan - they recommend: will admit the patient    Test Considered:  CT of chest but will defer very low suspicion for PE at this time he is febrile with likely source bilateral pneumonia I doubt this is PE at this time denies any pleuritic chest pain no unilateral leg swelling    Rule out I have low suspicion for ACS as history is atypical, , EKG  without signs of ischemia,  patient has slightly elevated troponins but this appears to be his baseline, very low suspicion for ACS suspect elevation is at his baseline. Low suspicion for AAA or aortic dissection as history is atypical, patient has low risk factors.  Low suspicion for CHF exacerbation as there is no peripheral edema present, no rales or my exam, BNP is at his normal limits.     Dispostion and problem list  After  consideration of the diagnostic results and the patients response to treatment, I feel that the patent would benefit from   Bilateral pneumonia-likely this is hospital-acquired recent discharge 2 weeks ago, started on antibiotics, will need further monitoring. COPD exacerbation-tight sounding, likely secondary due to bacterial infection, given bronchodilators will need further evaluation.             Final Clinical Impression(s) / ED Diagnoses Final diagnoses:  Hospital-acquired pneumonia  Acute exacerbation of chronic obstructive pulmonary disease (COPD) ([place])    Rx / DC Orders ED Discharge Orders     None         Marcello Fennel, PA-C 05/13/21 0116    Lorelle Gibbs, DO 05/16/21 1545

## 2021-05-12 NOTE — ED Provider Triage Note (Signed)
Emergency Medicine Provider Triage Evaluation Note  Francisco [name] , a 61 y.o. male  was evaluated in triage.  Patient reports being in and out of hospital for the last 3 to 4 weeks.  He states he woke up this morning with increased shortness of breath and chest pain.  He ran out of his prescribed prednisone yesterday.  History of COPD and CHF.  Review of Systems  Positive: Chest pressure, shortness of breath, cough, fever Negative: Abdominal pain, nausea, vomiting  Physical Exam  BP (!) 153/99 (BP Location: Left Arm)    Pulse (!) 117    Temp (!) 100.7 F (38.2 C) (Oral)    Resp (!) 3    Ht 6' (1.829 m)    Wt 89 kg    SpO2 91%    BMI 26.61 kg/m  Gen:   Awake, no distress   Resp:  Normal effort  MSK:   Moves extremities without difficulty  Other:  Audible wheezing while speaking  Medical Decision Making  Medically screening exam initiated at 6:10 PM.  Appropriate orders placed.  Othman Masur was informed that the remainder of the evaluation will be completed by another provider, this initial triage assessment does not replace that evaluation, and the importance of remaining in the ED until their evaluation is complete.  Of note patient was at 86% oxygen saturation on room air in triage, placed on 3 L nasal cannula.   Su Monks, PA-C 05/12/21 1814

## 2021-05-12 NOTE — Chronic Care Management (AMB) (Signed)
°  Care Management   Outreach Note  05/12/2021 Name: Francisco [name] MRN: [id number] DOB: 11/06/1960  Referred by: Adron Bene, MD Reason for referral : Care Coordination (Initial outreach to schedule referral with BSW)   Third unsuccessful telephone outreach was attempted today. The patient was referred to the case management team for assistance with care management and care coordination. The patient's primary care provider has been notified of our unsuccessful attempts to make or maintain contact with the patient. The care management team is pleased to engage with this patient at any time in the future should he/she be interested in assistance from the care management team.   Follow Up Plan:  We have been unable to make contact with the patient. The care management team is available to follow up with the patient after provider conversation with the patient regarding recommendation for care management engagement and subsequent re-referral to the care management team. A HIPAA compliant phone message was left for the patient providing contact information and requesting a return call. If patient returns call to provider office, please advise to call Embedded Care Management Care Guide Misty Stanley at [phone].  Gwenevere Ghazi  Care Guide, Embedded Care Coordination [facility]  Direct Dial: [phone]

## 2021-05-12 NOTE — Progress Notes (Signed)
Pharmacy Antibiotic Note  Francisco [name] is a 61 y.o. male admitted on 05/12/2021 with pneumonia. Patient recently discharged on 1/123 for COPD exacerbation after completing 3-day course of azithromycin. Pharmacy has been consulted for vancomycin dosing.  WBC is elevated at 21.6, patient is febrile with temp of 100.62F, renal function appears to be at baseline with Scr of 1.27. CXR showing potential right lower lobe nodule suspicious for bilateral pneumonia.   Plan: Vancomycin IV 1750 mg x 1 Vancomycin IV 1000 mg q12h (estimated AUC 513) Cefepime per MD Monitor renal function and signs of clinical improvement Follow-up cultures and MRSA PCR nasal swab  Height: 6' (182.9 cm) Weight: 89 kg (196 lb 3.4 oz) IBW/kg (Calculated) : 77.6  Temp (24hrs), Avg:100.7 F (38.2 C), Min:100.7 F (38.2 C), Max:100.7 F (38.2 C)  Recent Labs  Lab 05/12/21 1825 05/12/21 2210  WBC 21.6*  --   CREATININE 1.27*  --   LATICACIDVEN  --  1.1    Estimated Creatinine Clearance: 67.9 mL/min (A) (by C-G formula based on SCr of 1.27 mg/dL (H)).    Allergies  Allergen Reactions   Ivp Dye [Iodinated Contrast Media] Other (See Comments)    Seizures    Tramadol Other (See Comments)    Seizures     Antimicrobials this admission: 1/24 vancomycin >>  1/24 cefepime >>   Dose adjustments this admission: none  Microbiology results: 1/24 BCx: pending 1/24 UCx: pending  1/24 MRSA PCR: pending  Thank you for involving pharmacy in this patient's care.  Arnette Felts, PharmD PGY1 Ambulatory Care Pharmacy Resident 05/12/2021 11:23 PM  **Pharmacist phone directory can be found on [email or web address] listed under [facility]**

## 2021-05-12 NOTE — Hospital Course (Addendum)
SHOB, started yesterday. Productive cough Fevers, chills  Febrile, tachypneic, hypoxic Expiratory wheezing, improved with albuterol  Bilateral PNA on CXR Leukocytosis but recently on steroids Small nodule in RLL (infectious vs malignant?)  Hospital-acquired PNA -vanc/cefepime  Slightly elevated trops, but at baseline. Mild chest tightness but no chest pain. BNP at baseline __________________________________________________

## 2021-05-12 NOTE — ED Triage Notes (Signed)
Pt reports being in and out of the hospital the last 3-4 weeks. Woke up this morning with SOB and CP. States he ran out of his prednisone yesterday. 86% on RA in triage, placed on 3L.

## 2021-05-12 NOTE — H&P (Addendum)
Date: 05/13/2021               Patient Name:  Francisco [name] MRN: [id number]  DOB: [birth year] Age / Sex: 61 y.o., male   PCP: Delene Ruffini, MD         Medical Service: Internal Medicine Teaching Service         Attending Physician: Lenoria Farrier. MD    First Contact: Christiana Fuchs, DO Pager: KM [phone]  Second Contact: Gaylan Gerold, DO Pager: Tenny Craw       After Hours (After 5p/  First Contact Pager: [phone]  weekends / holidays): Second Contact Pager: [phone]    Chief Complaint: SHOB and cough   History of Present Illness:  Francisco [name] is a 61 year old male living with HTN, CAD s/p cardiac stents, congestive heart failure (EF 35 to 40%  TTE 08/09/2020), Chronic Hep C untreated, IV Methamphetamine use, and COPD treated with rescue inhaler who presents with increasing shortness of breath, DOE, fever, chills, cough, and white sputum production x 1 day. He states that he has some chest pressure, but denies chest pain. He has been taking his home medication without much relief, and currently finished dose of steroids.   Has hx of numerous previous hospital visits for exacerbation 2/2 medication noncompliance. Patient was recently admitted from 1/1 - 1/3 for COPD exacerbation that improved with abx, prednisone and breathing treatment. He was provided with Incruse Ellipta and albuterol inhalers prior to discharge. He followed up in at [facility], and given concern for possible worsening COPD exacerbation, he was started on a 10 day tapered steroid dose that was completed 2 days ago. Reports to be taking albuterol, Advair, Spiriva without much relief.  Denies N/V/D, abdominal pain, and urinary symptoms.   Initial ED work up with CBC with leukocytosis of 21.6, was 17 at time of d/c on 1/3, BMP unremarkable, BMP elevated 543 but at baseline, troponin 34 -> 51, near his baseline. Lactic acid 1.1. CXR with airspace opacities in bilateral lobes concerning for possible bilateral pneumonia, left pleural  effusion, and vague nodule obesity 2 cm in the right lower chest possible infection or malignancy. Received one dose of Vancomycin and Cefepine in the ED due to concern for HAP. Received Duonebs, with appropriate response.   Meds:   Current Facility-Administered Medications on File Prior to Encounter  Medication Dose Route Frequency Provider Last Rate Last Admin   methylPREDNISolone sodium succinate (SOLU-MEDROL) 40 mg/mL injection 40 mg  40 mg Intramuscular Once Iona Beard, MD       Current Outpatient Medications on File Prior to Encounter  Medication Sig Dispense Refill   acetaminophen (TYLENOL) 325 MG tablet Take 650 mg by mouth every 6 (six) hours as needed for moderate pain or headache.     albuterol (PROVENTIL) (2.5 MG/3ML) 0.083% nebulizer solution Use 1 vial (2.5 mg total) by nebulization every 6 (six) hours as needed for wheezing or shortness of breath. 360 mL 2   albuterol (VENTOLIN HFA) 108 (90 Base) MCG/ACT inhaler Inhale 2 puffs into the lungs every 6 (six) hours as needed for wheezing or shortness of breath 18 g 2   aspirin 81 MG EC tablet Take 1 tablet (81 mg total) by mouth daily. 30 tablet 2   atorvastatin (LIPITOR) 40 MG tablet Take 1 tablet (40 mg total) by mouth daily. 30 tablet 2   fluticasone-salmeterol (ADVAIR DISKUS) 100-50 MCG/ACT AEPB Inhale 1 puff into the lungs 2 (two) times daily. 60 each 0   furosemide (LASIX)  40 MG tablet Take 1 tablet (40 mg total) by mouth daily. 30 tablet 11   lisinopril (ZESTRIL) 10 MG tablet Take 1 tablet (10 mg total) by mouth daily. 30 tablet 11   metoprolol succinate (TOPROL-XL) 25 MG 24 hr tablet Take 1 tablet (25 mg total) by mouth in the morning. 30 tablet 2   nitroGLYCERIN (NITROSTAT) 0.4 MG SL tablet Place 1 tablet (0.4 mg total) under the tongue every 5 (five) minutes x 3 doses as needed for chest pain. 25 tablet 2   tiotropium (SPIRIVA) 18 MCG inhalation capsule Place 1 capsule (18 mcg total) into inhaler and inhale daily. 30  capsule 12   umeclidinium bromide (INCRUSE ELLIPTA) 62.5 MCG/ACT AEPB Inhale 1 puff into the lungs daily. 30 each 0   nicotine (NICODERM CQ - DOSED IN MG/24 HOURS) 14 mg/24hr patch Place 1 patch (14 mg total) onto the skin daily. 30 patch 1   nicotine polacrilex (NICORETTE) 2 MG gum Chew 1 piece (2 mg total) of gum as needed and as directed for smoking cessation. (Patient not taking: Reported on 05/12/2021) 110 tablet 0   Allergies: Allergies as of 05/12/2021 - Review Complete 05/12/2021  Allergen Reaction Noted   Ivp dye [iodinated contrast media] Other (See Comments) 06/12/2018   Tramadol Other (See Comments) 06/12/2018   Past Medical History:  Diagnosis Date   CHF (congestive heart failure) (HCC)    COPD (chronic obstructive pulmonary disease) (HCC)    Coronary artery disease    a. s/p prior PCI.   Hepatitis-C    Hypertension    IV drug abuse ([place])    a. 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   Medically noncompliant    MI (myocardial infarction) ([place])    Tobacco abuse    Family History  Problem Relation Age of Onset   Rheum arthritis Mother    Heart disease Paternal Grandfather     Social History:   Lives With:Roommate Occupation: Building surveyor  Support: Family lives in [place].  Level of Function: Performs his own ADL's and IADL's.  Substances: Previously used IV heroin, but no longer uses. Last use IV methamphetamine 2 days ago. Smoking 1/2ppd cigarettes. No alcohol use.   Review of Systems: A complete ROS was negative except as per HPI.    Physical Exam: Blood pressure (!) 147/92, pulse 100, temperature (!) 100.7 F (38.2 C), temperature source Oral, resp. rate (!) 21, height 6' (1.829 m), weight 89 kg, SpO2 95 %.  Constitutional: alert, well-appearing, in NAD HENT: normocephalic, atraumatic, mucous membranes moist Cardiovascular: RRR, no m/r/g, trace bilateral LE Pulmonary/Chest: normal work of breathing on room air, diffuse expiratory wheezing   Abdominal: soft, non-tender to palpation, non-distended Neurological: A&O x 3 Skin: warm and dry, track marks present on both arms   EKG: Sinus tachycardia   CXR: airspace opacities in bilateral lobes concerning for possible bilateral pneumonia, left pleural effusion, and vague nodule opacity 2 cm in the right lower chest possible infection or malignancy.   Assessment & Plan by Problem: Principal Problem:   Community acquired pneumonia   Abayomi Abler is a 61 y.o. male with a pertinent PMH of HTN, CAD s/p cardiac stents, congestive heart failure (EF 35 to 40%  TTE 08/09/2020), Chronic Hep C untreated, IV Methamphetamine use, and COPD admitted for CAP and COPD exacerbation.   CAP Leukocytosis  CXR with possible bilateral PNA. Systemic symptoms (fever and chills) with Tmax 100.7. Leukocytosis 21.6 likely multifactorial in setting of recent steroid use and active CAP  infection. Received Vancomycin and Cefepime d/t concerns for HAP in the ED. This is likely CAP given pt's timeline, will adjust abx for tomorrow.  - Transition to ceftriaxone and azithromycin tomorrow  - Follow up on blood cultures - Trend CBC   COPD exacerbation, moderate to severe  Patient presents with 3/3 cardinal signs of COPD exacerbation. Currently on room air but initially needing supplemental oxygen in ED. Improving with breathing treatments. Received Vancomycin and Cefepime in the ED.  - Albuterol q6h - Incruse Ellipta daily - Dulera bid    - Transition to ceftriaxone (1 g daily for 4 days) and Azithromycin (500 mg daily 3 days) tomorrow.  - Will hold off on steroids given active infection (see above) and recent course  - Follow up blood cultures  Pulmonary nodule 2 cm nodular opacity on CXR in RLL. Given new finding compared to 04/19/21 CXR, likely infection given abrupt appearance, but malignancy cannot be ruled out.  - Needs follow up imaging in 6-12 weeks   Chronic systolic and diastolic heart failure (LVEF  60-65%) Nonischemic dilated cardiomyopathy CAD HTN TEE obtained during prior admission (04/20/21) with LVEF 60-65%, mild LVH, otherwise normal, with no regional wall abnormalities or valvular disease. Cath in 2021 showed nonischemic cardiomyopathy. Previously unable to afford medications and has not been on goal-directed medical therapy. He continues to use amphetamines.  - Continue home Lasix 40 mg  - Continue home Lisinopril 10 mg - Strict I/O's and daily weights  - BMP  Chronic hepatitis C  Reported chronic hep C during prior admission, but not labs on file. Recent 04/19/21 labs showing Hep C positive with quantification 5.5 million  - Schedule pt for outpatient ID follow up   PAD HLD  LDL at 117  - Atorvastatin 40 mg   Methamphetamine abuse Last IV use 2 days ago.    - Follow up on blood cultures   Tobacco use disorder - Nicotine patch   Best Practice: Diet: Heart Healthy IVF: None,None VTE: Enoxaparin Code: Full   Lajean Manes, MD  Internal Medicine Resident, PGY-1 [facility]  Pager: [phone] 1:23 AM, 05/13/2021

## 2021-05-13 ENCOUNTER — Inpatient Hospital Stay (HOSPITAL_COMMUNITY): Payer: Self-pay

## 2021-05-13 DIAGNOSIS — R918 Other nonspecific abnormal finding of lung field: Secondary | ICD-10-CM

## 2021-05-13 DIAGNOSIS — I2510 Atherosclerotic heart disease of native coronary artery without angina pectoris: Secondary | ICD-10-CM

## 2021-05-13 DIAGNOSIS — I5042 Chronic combined systolic (congestive) and diastolic (congestive) heart failure: Secondary | ICD-10-CM

## 2021-05-13 DIAGNOSIS — E785 Hyperlipidemia, unspecified: Secondary | ICD-10-CM

## 2021-05-13 DIAGNOSIS — J441 Chronic obstructive pulmonary disease with (acute) exacerbation: Principal | ICD-10-CM

## 2021-05-13 DIAGNOSIS — F1910 Other psychoactive substance abuse, uncomplicated: Secondary | ICD-10-CM

## 2021-05-13 DIAGNOSIS — J189 Pneumonia, unspecified organism: Secondary | ICD-10-CM | POA: Diagnosis present

## 2021-05-13 DIAGNOSIS — F17210 Nicotine dependence, cigarettes, uncomplicated: Secondary | ICD-10-CM

## 2021-05-13 DIAGNOSIS — I110 Hypertensive heart disease with heart failure: Secondary | ICD-10-CM

## 2021-05-13 DIAGNOSIS — J440 Chronic obstructive pulmonary disease with acute lower respiratory infection: Secondary | ICD-10-CM

## 2021-05-13 DIAGNOSIS — I739 Peripheral vascular disease, unspecified: Secondary | ICD-10-CM

## 2021-05-13 DIAGNOSIS — B182 Chronic viral hepatitis C: Secondary | ICD-10-CM

## 2021-05-13 LAB — URINALYSIS, ROUTINE W REFLEX MICROSCOPIC
Bilirubin Urine: NEGATIVE
Glucose, UA: NEGATIVE mg/dL
Hgb urine dipstick: NEGATIVE
Ketones, ur: 5 mg/dL — AB
Leukocytes,Ua: NEGATIVE
Nitrite: NEGATIVE
Protein, ur: NEGATIVE mg/dL
Specific Gravity, Urine: 1.024 (ref 1.005–1.030)
pH: 5 (ref 5.0–8.0)

## 2021-05-13 LAB — RESP PANEL BY RT-PCR (FLU A&B, COVID) ARPGX2
Influenza A by PCR: NEGATIVE
Influenza B by PCR: NEGATIVE
SARS Coronavirus 2 by RT PCR: NEGATIVE

## 2021-05-13 LAB — CBC
HCT: 46.3 % (ref 39.0–52.0)
Hemoglobin: 14.8 g/dL (ref 13.0–17.0)
MCH: 31.2 pg (ref 26.0–34.0)
MCHC: 32 g/dL (ref 30.0–36.0)
MCV: 97.7 fL (ref 80.0–100.0)
Platelets: 196 10*3/uL (ref 150–400)
RBC: 4.74 MIL/uL (ref 4.22–5.81)
RDW: 13.3 % (ref 11.5–15.5)
WBC: 17.2 10*3/uL — ABNORMAL HIGH (ref 4.0–10.5)
nRBC: 0 % (ref 0.0–0.2)

## 2021-05-13 LAB — RAPID URINE DRUG SCREEN, HOSP PERFORMED
Amphetamines: POSITIVE — AB
Barbiturates: NOT DETECTED
Benzodiazepines: NOT DETECTED
Cocaine: NOT DETECTED
Opiates: NOT DETECTED
Tetrahydrocannabinol: NOT DETECTED

## 2021-05-13 LAB — COMPREHENSIVE METABOLIC PANEL
ALT: 62 U/L — ABNORMAL HIGH (ref 0–44)
AST: 31 U/L (ref 15–41)
Albumin: 3 g/dL — ABNORMAL LOW (ref 3.5–5.0)
Alkaline Phosphatase: 43 U/L (ref 38–126)
Anion gap: 8 (ref 5–15)
BUN: 14 mg/dL (ref 6–20)
CO2: 22 mmol/L (ref 22–32)
Calcium: 8.4 mg/dL — ABNORMAL LOW (ref 8.9–10.3)
Chloride: 103 mmol/L (ref 98–111)
Creatinine, Ser: 1.1 mg/dL (ref 0.61–1.24)
GFR, Estimated: >60 mL/min (ref >60)
Glucose, Bld: 82 mg/dL (ref 70–99)
Potassium: 4.1 mmol/L (ref 3.5–5.1)
Sodium: 133 mmol/L — ABNORMAL LOW (ref 135–145)
Total Bilirubin: 1.5 mg/dL — ABNORMAL HIGH (ref 0.3–1.2)
Total Protein: 5.6 g/dL — ABNORMAL LOW (ref 6.5–8.1)

## 2021-05-13 LAB — MRSA NEXT GEN BY PCR, NASAL: MRSA by PCR Next Gen: NOT DETECTED

## 2021-05-13 LAB — TROPONIN I (HIGH SENSITIVITY): Troponin I (High Sensitivity): 35 ng/L — ABNORMAL HIGH (ref <18)

## 2021-05-13 MED ORDER — PREDNISONE 20 MG PO TABS
40.0000 mg | ORAL_TABLET | Freq: Every day | ORAL | Status: DC
  Administered 2021-05-13 – 2021-05-14 (×2): 40 mg via ORAL
  Filled 2021-05-13 (×2): qty 2

## 2021-05-13 MED ORDER — SODIUM CHLORIDE 0.9 % IV SOLN
1.0000 g | INTRAVENOUS | Status: DC
  Administered 2021-05-13 – 2021-05-14 (×2): 1 g via INTRAVENOUS
  Filled 2021-05-13 (×2): qty 10

## 2021-05-13 MED ORDER — NICOTINE 14 MG/24HR TD PT24
14.0000 mg | MEDICATED_PATCH | Freq: Every day | TRANSDERMAL | Status: DC
  Administered 2021-05-13: 09:00:00 14 mg via TRANSDERMAL
  Filled 2021-05-13 (×2): qty 1

## 2021-05-13 MED ORDER — ACETAMINOPHEN 500 MG PO TABS
1000.0000 mg | ORAL_TABLET | Freq: Three times a day (TID) | ORAL | Status: DC | PRN
  Administered 2021-05-13: 20:00:00 1000 mg via ORAL
  Filled 2021-05-13: qty 2

## 2021-05-13 MED ORDER — SODIUM CHLORIDE 0.9 % IV SOLN: 1.0000 g | INTRAVENOUS | Status: DC

## 2021-05-13 MED ORDER — ENOXAPARIN SODIUM 40 MG/0.4ML IJ SOSY
40.0000 mg | PREFILLED_SYRINGE | INTRAMUSCULAR | Status: DC
  Administered 2021-05-13 – 2021-05-14 (×2): 40 mg via SUBCUTANEOUS
  Filled 2021-05-13 (×2): qty 0.4

## 2021-05-13 MED ORDER — AZITHROMYCIN 250 MG PO TABS
500.0000 mg | ORAL_TABLET | Freq: Every day | ORAL | Status: DC
  Administered 2021-05-13 – 2021-05-14 (×2): 500 mg via ORAL
  Filled 2021-05-13 (×2): qty 2

## 2021-05-13 NOTE — Progress Notes (Addendum)
HD#0 SUBJECTIVE:  Patient Summary: Francisco [name] is a 61 y.o. with a pertinent PMH of HTN, CAD s/p cardiac stents, congestive heart failure (EF 35 to 40%  TTE 08/09/2020), Chronic Hep C untreated, IV Methamphetamine use, and COPD treated with rescue inhaler who presented with SOB and cough and admitted for CAP/COPD exacerbation.   Overnight Events: none  Interim History: Pt was seen at bedside during rounds today. He states that his SOB has gotten slightly better since its onset two days ago. He has been coughing up a white foamy sputum. No other complaints or concerns today.   OBJECTIVE:  Vital Signs: Vitals:   05/13/21 0415 05/13/21 0500 05/13/21 0600 05/13/21 0700  BP: (!) 155/139 134/76 (!) 163/86 (!) 155/102  Pulse: (!) 107 (!) 112 (!) 111 (!) 114  Resp: 20 (!) 21 (!) 24 17  Temp:      TempSrc:      SpO2: 92% (!) 89% 92% 94%  Weight:      Height:       Supplemental O2: Nasal Cannula SpO2: 94 % O2 Flow Rate (L/min): 2 L/min  Filed Weights   05/12/21 1806  Weight: 89 kg     Intake/Output Summary (Last 24 hours) at 05/13/2021 0716 Last data filed at 05/13/2021 0416 Gross per 24 hour  Intake 1350 ml  Output 300 ml  Net 1050 ml   Net IO Since Admission: 1,050 mL [05/13/21 0716]  Physical Exam: Constitutional: well-appearing gentleman sitting in bed, in no acute distress HENT: normocephalic atraumatic, mucous membranes moist Eyes: conjunctiva non-erythematous Neck: supple Cardiovascular: regular rate and rhythm, no m/r/g Pulmonary/Chest: normal work of breathing on room air, lungs clear to auscultation over superior lung fields, decreased breath sounds without wheezing or rhonchi over bilateral lower lung fields Abdominal: soft, non-tender, non-distended MSK: normal bulk and tone Neurological: alert & oriented x 3 Skin: warm and dry Psych: Normal mood and behavior  Patient Lines/Drains/Airways Status     Active Line/Drains/Airways     Name Placement date  Placement time Site Days   Peripheral IV 05/12/21 20 G Left Antecubital 05/12/21  2209  Antecubital  1   Peripheral IV 05/12/21 20 G Anterior;Distal;Right Forearm 05/12/21  2300  Forearm  1            Pertinent Labs: CBC Latest Ref Rng & Units 05/13/2021 05/12/2021 04/21/2021  WBC 4.0 - 10.5 K/uL 17.2(H) 21.6(H) 17.3(H)  Hemoglobin 13.0 - 17.0 g/dL 14.8 16.3 15.7  Hematocrit 39.0 - 52.0 % 46.3 49.0 49.4  Platelets 150 - 400 K/uL 196 254 322    CMP Latest Ref Rng & Units 05/13/2021 05/12/2021 04/23/2021  Glucose 70 - 99 mg/dL 82 120(H) 78  BUN 6 - 20 mg/dL 14 15 19   Creatinine 0.61 - 1.24 mg/dL 1.10 1.27(H) 1.20  Sodium 135 - 145 mmol/L 133(L) 139 145(H)  Potassium 3.5 - 5.1 mmol/L 4.1 4.1 4.0  Chloride 98 - 111 mmol/L 103 97(L) 101  CO2 22 - 32 mmol/L 22 27 27   Calcium 8.9 - 10.3 mg/dL 8.4(L) 9.6 10.1  Total Protein 6.5 - 8.1 g/dL 5.6(L) 6.7 -  Total Bilirubin 0.3 - 1.2 mg/dL 1.5(H) 1.5(H) -  Alkaline Phos 38 - 126 U/L 43 57 -  AST 15 - 41 U/L 31 38 -  ALT 0 - 44 U/L 62(H) 70(H) -    No results for input(s): GLUCAP in the last 72 hours.   Pertinent Imaging: DG Chest 2 View  Addendum Date: 05/12/2021  ADDENDUM REPORT: 05/12/2021 19:07 ADDENDUM: Follow-up for potential RIGHT lower lobe nodule and presence of suspected bilateral basilar pneumonia was discussed as outlined below. These results were called by telephone at the time of interpretation on 05/12/2021 at 7:07 pm to provider ADAM CURATOLO , who verbally acknowledged these results. Electronically Signed   By: Zetta Bills M.D.   On: 05/12/2021 19:07   Result Date: 05/12/2021 CLINICAL DATA:  Shortness of breath. EXAM: CHEST - 2 VIEW COMPARISON:  Comparison is made with April 19, 2021. FINDINGS: Small pleural effusion and LEFT basilar airspace disease. Vague opacity also in the RIGHT chest in the RIGHT lower chest measuring up to 2 cm. Increased density along the RIGHT inferior heart border as well since prior imaging  Mediastinal contours and hilar structures are otherwise stable. No visible pneumothorax On limited assessment there is no acute skeletal process. IMPRESSION: 1. Bibasilar airspace disease potential small LEFT pleural effusion, this has developed since prior imaging. Findings suspicious for bilateral pneumonia, given distribution would also consider the possibility of aspiration. Correlate with any risk factors for aspiration. 2. Vague nodular opacity up to 2 cm in the RIGHT lower chest could potentially represent confluence of shadows or infection given additional findings seen on the current study. Given the nodular appearance in the RIGHT lower lobe would suggest 6-12 week follow-up after resolution of symptoms to exclude developing nodule in this location. Electronically Signed: By: Zetta Bills M.D. On: 05/12/2021 18:59    ASSESSMENT/PLAN:  Assessment: Principal Problem:   Community acquired pneumonia   Francisco [name] is a 61 y.o. with a pertinent PMH of HTN, CAD s/p cardiac stents, congestive heart failure (EF 35 to 40%  TTE 08/09/2020), Chronic Hep C untreated, IV Methamphetamine use, and COPD treated with rescue inhaler who presented with SOB and cough and admitted for CAP/COPD exacerbation.   Plan: CAP Leukocytosis  Patient assessed this morning and notes that his breathing feels somewhat better since last night.  He remains tachypneic, with stable oxygen saturation on 2 L.   CXR showed bilateral basilar opacities concerning for pneumonia.  Leukocytosis improving from 21.6-17.2. CT chest showed multifocal bibasilar and middle lobe pneumonia. Multiple nodules noted, not typical for septic emboli although will continue to f/u blood cultures and monitor for worsening. No murmur. -Continue ceftriaxone and azithromycin, day 1 - Follow up on blood cultures - Trend CBC    COPD exacerbation, moderate to severe  Tobacco use disorder Patient presented with 3/3 cardinal signs of COPD exacerbation.   Oxygen saturations stable on 2 L nasal cannula. Likely that COPD exacerbation 2/2 to pneumonia and continued tobacco use. He states that he has been using his inhalers that he was prescribed from clinic. - Albuterol q6h - Incruse Ellipta daily - Dulera bid    - Transition to ceftriaxone (1 g daily for 4 days) and Azithromycin (500 mg daily 3 days) day 1 - Prednisone 40 mg, day 1/5 - Follow up blood cultures   Pulmonary nodule CT showed discrete nodular areas likely related to infection or inflammation.  Patient will need follow-up imaging in 8 to 12 weeks to ensure resolution.   Chronic systolic and diastolic heart failure with recovered EF (LVEF 60-65%) Nonischemic dilated cardiomyopathy CAD HTN TEE obtained during prior admission (04/20/21) with LVEF 60-65%, mild LVH, otherwise normal, with no regional wall abnormalities or valvular disease. Previous EF 35-40% in 07/2020. Cath in 2021 showed nonischemic cardiomyopathy. Previously unable to afford medications and has not been on goal-directed medical therapy. He  continues to use amphetamines.  - Continue home Lasix 40 mg  - Continue home Lisinopril 10 mg - Strict I/O's and daily weights  - BMP   Chronic hepatitis C  Reported chronic hep C during prior admission, but not labs on file. Recent 04/19/21 labs showing Hep C positive with quantification 5.5 million  - Schedule pt for outpatient ID follow up    PAD HLD  LDL at 117  - Atorvastatin 40 mg    Methamphetamine abuse Last IV use 2 days ago.    - Follow up on blood cultures    Tobacco use disorder - Nicotine patch  Best Practice: Diet: Cardiac diet IVF: none VTE: enoxaparin (LOVENOX) injection 40 mg Start: 05/13/21 0800 Code: Full AB: ceftriaxone, azithromycin Family Contact: patient will update family DISPO: Anticipated discharge in 1-3 days to Home pending Medical stability.  Signature: Daleen Bo. Ronald Vinsant, D.O.  Internal Medicine Resident, PGY-1 [facility]  Pager: [phone] 7:16 AM, 05/13/2021   Please contact the on call pager after 5 pm and on weekends at [phone].

## 2021-05-13 NOTE — Progress Notes (Signed)
Internal Medicine Clinic Attending ? ?Case discussed with Dr. Liang  At the time of the visit.  We reviewed the resident?s history and exam and pertinent patient test results.  I agree with the assessment, diagnosis, and plan of care documented in the resident?s note. ? ?

## 2021-05-14 ENCOUNTER — Other Ambulatory Visit (HOSPITAL_COMMUNITY): Payer: Self-pay

## 2021-05-14 LAB — BASIC METABOLIC PANEL
Anion gap: 8 (ref 5–15)
BUN: 18 mg/dL (ref 6–20)
CO2: 30 mmol/L (ref 22–32)
Calcium: 9.2 mg/dL (ref 8.9–10.3)
Chloride: 102 mmol/L (ref 98–111)
Creatinine, Ser: 1.33 mg/dL — ABNORMAL HIGH (ref 0.61–1.24)
GFR, Estimated: >60 mL/min (ref >60)
Glucose, Bld: 115 mg/dL — ABNORMAL HIGH (ref 70–99)
Potassium: 4.8 mmol/L (ref 3.5–5.1)
Sodium: 140 mmol/L (ref 135–145)

## 2021-05-14 LAB — CBC
HCT: 50.1 % (ref 39.0–52.0)
Hemoglobin: 16.6 g/dL (ref 13.0–17.0)
MCH: 32.2 pg (ref 26.0–34.0)
MCHC: 33.1 g/dL (ref 30.0–36.0)
MCV: 97.3 fL (ref 80.0–100.0)
Platelets: 223 10*3/uL (ref 150–400)
RBC: 5.15 MIL/uL (ref 4.22–5.81)
RDW: 13.1 % (ref 11.5–15.5)
WBC: 13.1 10*3/uL — ABNORMAL HIGH (ref 4.0–10.5)
nRBC: 0 % (ref 0.0–0.2)

## 2021-05-14 LAB — URINE CULTURE: Culture: NO GROWTH

## 2021-05-14 MED ORDER — CEFDINIR 300 MG PO CAPS
600.0000 mg | ORAL_CAPSULE | Freq: Every day | ORAL | 0 refills | Status: AC
  Filled 2021-05-14: qty 10, 5d supply, fill #0

## 2021-05-14 MED ORDER — AZITHROMYCIN 250 MG PO TABS
ORAL_TABLET | ORAL | 0 refills | Status: DC
  Filled 2021-05-14: qty 1, 1d supply, fill #0

## 2021-05-14 MED ORDER — PREDNISONE 20 MG PO TABS
40.0000 mg | ORAL_TABLET | Freq: Every day | ORAL | 0 refills | Status: AC
  Filled 2021-05-14: qty 6, 3d supply, fill #0

## 2021-05-14 MED ORDER — ALBUTEROL SULFATE (2.5 MG/3ML) 0.083% IN NEBU
2.5000 mg | INHALATION_SOLUTION | Freq: Four times a day (QID) | RESPIRATORY_TRACT | Status: DC
  Administered 2021-05-14: 2.5 mg via RESPIRATORY_TRACT

## 2021-05-14 NOTE — TOC Initial Note (Signed)
Transition of Care [facility]) - Initial/Assessment Note    Patient Details  Name: Francisco [name] MRN: [id number] Date of Birth: July 25, 1960  Transition of Care [facility]) CM/SW Contact:    Ninfa Meeker, RN Phone Number: 05/14/2021, 1:21 PM  Clinical Narrative:  Patient is a 61 yr old male admitted with Community acquired pneumonia. Will need oxygen for home. CM has contacted Freda Munro with [employer] to arrange. Oxygen for transport will  be delivered to patient's room.                  Expected Discharge Plan: Home/Self Care Barriers to Discharge: No Barriers Identified   Patient Goals and CMS Choice        Expected Discharge Plan and Services Expected Discharge Plan: Home/Self Care   Discharge Planning Services: CM Consult Post Acute Care Choice: Durable Medical Equipment   Expected Discharge Date: 05/14/21               DME Arranged: Oxygen DME Agency: [employer] Date DME Agency Contacted: 05/14/21 Time DME Agency Contacted: 63 Representative spoke with at DME Agency: Freda Munro HH Arranged: NA [place] Agency: NA        Prior Living Arrangements/Services                       Activities of Daily Living      Permission Sought/Granted                  Emotional Assessment       Orientation: : Oriented to Self, Oriented to Place, Oriented to  Time, Oriented to Situation Alcohol / Substance Use: Not Applicable Psych Involvement: No (comment)  Admission diagnosis:  Acute exacerbation of chronic obstructive pulmonary disease (COPD) ([place]) [J44.1] Community acquired pneumonia [J18.9] Hospital-acquired pneumonia [J18.9, Y95] Patient Active Problem List   Diagnosis Date Noted   Community acquired pneumonia 05/13/2021   Anxiety 05/01/2021   Insurance coverage problems 04/27/2021   Hypertension 04/26/2021   Acute kidney injury ([place])    Tobacco abuse 01/16/2021   COPD with acute exacerbation ([place]) 01/09/2020   Acute on chronic combined systolic and diastolic CHF  (congestive heart failure) ([place]) 08/17/2019   Methamphetamine abuse ([place]) 08/17/2019   Chronic obstructive pulmonary disease ([place])    Dilated cardiomyopathy ([place])    CAD S/P percutaneous coronary angioplasty    PCP:  Delene Ruffini, MD Pharmacy:   [facility] [address] [place] [address] Phone: [phone] Fax: [place] [address] [place] [address] Phone: [phone] Fax: [phone]     Social Determinants of Health (SDOH) Interventions    Readmission Risk Interventions Readmission Risk Prevention Plan 08/20/2019  Transportation Screening Complete  PCP or Specialist Appt within 3-5 Days Complete  HRI or [place] Complete  Social Work Consult for [place] Planning/Counseling Complete  Palliative Care Screening Not Applicable  Medication Review Press photographer) Complete  Some recent data might be hidden

## 2021-05-14 NOTE — Progress Notes (Incomplete)
HD#1 SUBJECTIVE:  Patient Summary: Francisco [name] is a 61 y.o. with a pertinent PMH of HTN, CAD s/p cardiac stents, congestive heart failure (EF 35 to 40%  TTE 08/09/2020), Chronic Hep C untreated, IV Methamphetamine use, and COPD treated with rescue inhaler who presented with SOB and cough and admitted for CAP/COPD exacerbation.   Overnight Events: none  Interim History: Pt was seen at bedside during rounds today. He states that his SOB has gotten slightly better since its onset two days ago. He has been coughing up a white foamy sputum. No other complaints or concerns today.   OBJECTIVE:  Vital Signs: Vitals:   05/13/21 1900 05/13/21 2050 05/13/21 2312 05/14/21 0317  BP:   (!) 155/109 (!) 154/89  Pulse:   96 (!) 102  Resp:   17 17  Temp:   98.1 F (36.7 C) 97.8 F (36.6 C)  TempSrc:   Oral Oral  SpO2: 100% 100% 98% 96%  Weight:      Height:       Supplemental O2: Nasal Cannula SpO2: 96 % O2 Flow Rate (L/min): 2 L/min  Filed Weights   05/12/21 1806  Weight: 89 kg     Intake/Output Summary (Last 24 hours) at 05/14/2021 [id number] Last data filed at 05/14/2021 0500 Gross per 24 hour  Intake 240 ml  Output 300 ml  Net -60 ml    Net IO Since Admission: 990 mL [05/14/21 0635]  Physical Exam: Constitutional: well-appearing gentleman sitting in bed, in no acute distress HENT: normocephalic atraumatic, mucous membranes moist Eyes: conjunctiva non-erythematous Neck: supple Cardiovascular: regular rate and rhythm, no m/r/g Pulmonary/Chest: normal work of breathing on room air, lungs clear to auscultation over superior lung fields, decreased breath sounds without wheezing or rhonchi over bilateral lower lung fields Abdominal: soft, non-tender, non-distended MSK: normal bulk and tone Neurological: alert & oriented x 3 Skin: warm and dry Psych: Normal mood and behavior  Patient Lines/Drains/Airways Status     Active Line/Drains/Airways     Name Placement date Placement time  Site Days   Peripheral IV 05/12/21 20 G Left Antecubital 05/12/21  2209  Antecubital  1   Peripheral IV 05/12/21 20 G Anterior;Distal;Right Forearm 05/12/21  2300  Forearm  1            Pertinent Labs: CBC Latest Ref Rng & Units 05/14/2021 05/13/2021 05/12/2021  WBC 4.0 - 10.5 K/uL 13.1(H) 17.2(H) 21.6(H)  Hemoglobin 13.0 - 17.0 g/dL 16.6 14.8 16.3  Hematocrit 39.0 - 52.0 % 50.1 46.3 49.0  Platelets 150 - 400 K/uL 223 196 254    CMP Latest Ref Rng & Units 05/14/2021 05/13/2021 05/12/2021  Glucose 70 - 99 mg/dL 115(H) 82 120(H)  BUN 6 - 20 mg/dL 18 14 15   Creatinine 0.61 - 1.24 mg/dL 1.33(H) 1.10 1.27(H)  Sodium 135 - 145 mmol/L 140 133(L) 139  Potassium 3.5 - 5.1 mmol/L 4.8 4.1 4.1  Chloride 98 - 111 mmol/L 102 103 97(L)  CO2 22 - 32 mmol/L 30 22 27   Calcium 8.9 - 10.3 mg/dL 9.2 8.4(L) 9.6  Total Protein 6.5 - 8.1 g/dL - 5.6(L) 6.7  Total Bilirubin 0.3 - 1.2 mg/dL - 1.5(H) 1.5(H)  Alkaline Phos 38 - 126 U/L - 43 57  AST 15 - 41 U/L - 31 38  ALT 0 - 44 U/L - 62(H) 70(H)    No results for input(s): GLUCAP in the last 72 hours.   Pertinent Imaging: CT CHEST WO CONTRAST  Result Date: 05/13/2021 CLINICAL  DATA:  A 61 year old male presents for evaluation of COPD exacerbation and pneumonia. EXAM: CT CHEST WITHOUT CONTRAST TECHNIQUE: Multidetector CT imaging of the chest was performed following the standard protocol without IV contrast. RADIATION DOSE REDUCTION: This exam was performed according to the departmental dose-optimization program which includes automated exposure control, adjustment of the mA and/or kV according to patient size and/or use of iterative reconstruction technique. COMPARISON:  Chest x-ray from April 19, 2021 and from May 12, 2021. FINDINGS: Cardiovascular: Generalized aortic atherosclerosis and signs of three-vessel coronary artery disease. Mild cardiomegaly without pericardial effusion. Central pulmonary vasculature is normal caliber. Limited assessment of  cardiovascular structures given lack of intravenous contrast. Mediastinum/Nodes: Scattered small lymph nodes throughout the chest none with pathologic enlargement, likely reactive given findings in the lung, see below. Lungs/Pleura: Bilateral basilar nodular consolidative changes and discrete nodular areas. RIGHT middle lobe nodular consolidation and discrete nodular areas. No signs of cavitation or evidence of pleural effusion. Areas of more pronounced in the dependent chest. Airways are patent. An example of discrete nodules as outlined below. (Image 123/5) 1 cm nodule. (Image 109/5) 1 cm nodule both the above nodules are in the RIGHT lower lobe. (Image 126/5) 14 mm RIGHT middle lobe nodule. (Image 135/5) 9 mm LEFT lower lobe pulmonary nodule. Other areas of nodular more consolidative appearing changes in the bilateral chest. Upper Abdomen: No acute findings relative to the liver, gallbladder, pancreas or spleen. Mild perinephric stranding is seen bilaterally. Adrenal glands are normal. Imaged portions of the gastrointestinal tract are unremarkable. Musculoskeletal: No chest wall mass or suspicious bone lesions identified. IMPRESSION: 1. Multifocal bibasilar and middle lobe pneumonia. 2. Discrete nodular areas likely related to infection or inflammation. Given patient history of COPD would suggest follow-up in 8-12 weeks to ensure resolution. 3. Also given nodularity would suggest blood culture correlation though findings are not typical for septic emboli with no cavitation seen on current imaging. 4. Generalized aortic atherosclerosis and signs of three-vessel coronary artery disease. Aortic Atherosclerosis (ICD10-I70.0). Electronically Signed   By: Zetta Bills M.D.   On: 05/13/2021 10:22    ASSESSMENT/PLAN:  Assessment: Principal Problem:   Community acquired pneumonia   Francisco [name] is a 61 y.o. with a pertinent PMH of HTN, CAD s/p cardiac stents, congestive heart failure (EF 35 to 40%  TTE  08/09/2020), Chronic Hep C untreated, IV Methamphetamine use, and COPD treated with rescue inhaler who presented with SOB and cough and admitted for CAP/COPD exacerbation.   Plan: CAP Leukocytosis  Patient assessed this morning and notes that his breathing feels somewhat better since last night.  He remains tachypneic, with stable oxygen saturation on 2 L.   CXR showed bilateral basilar opacities concerning for pneumonia.  Leukocytosis improving from 21.6-17.2. CT chest showed multifocal bibasilar and middle lobe pneumonia. Multiple nodules noted, not typical for septic emboli although will continue to f/u blood cultures and monitor for worsening. No murmur. -Continue ceftriaxone and azithromycin, day 1 - Follow up on blood cultures - Trend CBC    COPD exacerbation, moderate to severe  Tobacco use disorder Patient presented with 3/3 cardinal signs of COPD exacerbation.  Oxygen saturations stable on 2 L nasal cannula. Likely that COPD exacerbation 2/2 to pneumonia and continued tobacco use. He states that he has been using his inhalers that he was prescribed from clinic. - Albuterol q6h - Incruse Ellipta daily - Dulera bid    - Transition to ceftriaxone (1 g daily for 4 days) and Azithromycin (500 mg daily 3  days) day 1 - Prednisone 40 mg, day 1/5 - Follow up blood cultures   Pulmonary nodule CT showed discrete nodular areas likely related to infection or inflammation.  Patient will need follow-up imaging in 8 to 12 weeks to ensure resolution.   Chronic systolic and diastolic heart failure with recovered EF (LVEF 60-65%) Nonischemic dilated cardiomyopathy CAD HTN TEE obtained during prior admission (04/20/21) with LVEF 60-65%, mild LVH, otherwise normal, with no regional wall abnormalities or valvular disease. Previous EF 35-40% in 07/2020. Cath in 2021 showed nonischemic cardiomyopathy. Previously unable to afford medications and has not been on goal-directed medical therapy. He continues to  use amphetamines.  - Continue home Lasix 40 mg  - Continue home Lisinopril 10 mg - Strict I/O's and daily weights  - BMP   Chronic hepatitis C  Reported chronic hep C during prior admission, but not labs on file. Recent 04/19/21 labs showing Hep C positive with quantification 5.5 million  - Schedule pt for outpatient ID follow up    PAD HLD  LDL at 117  - Atorvastatin 40 mg    Methamphetamine abuse Last IV use 2 days ago.    - Follow up on blood cultures    Tobacco use disorder - Nicotine patch  Best Practice: Diet: Cardiac diet IVF: none VTE: enoxaparin (LOVENOX) injection 40 mg Start: 05/13/21 0800 Code: Full AB: ceftriaxone, azithromycin Family Contact: patient will update family DISPO: Anticipated discharge in 1-3 days to Home pending Medical stability.  Signature: Daleen Bo. Samuella Rasool, D.O.  Internal Medicine Resident, PGY-1 [facility]  Pager: [phone] 6:35 AM, 05/14/2021   Please contact the on call pager after 5 pm and on weekends at [phone].

## 2021-05-14 NOTE — Discharge Summary (Addendum)
Name: Francisco [name] MRN: [id number] DOB: July 01, 1960 61 y.o. PCP: Delene Ruffini, MD  Date of Admission: 05/12/2021  6:02 PM Date of Discharge:05/14/21 Attending Physician: Charise Killian, MD  Discharge Diagnosis: 1. COPD exacerbation 2. Community Acquired Pneumonia 3. Bilateral nodules  Chronic: Chronic Hepatitis C PAD HLD Tobacco use disorder Chronic systolic and diastolic heart failure Nonischemic dilated cardiomyopathy CAD HTN Methamphetamine abuse  Discharge Medications: Allergies as of 05/14/2021       Reactions   Ivp Dye [iodinated Contrast Media] Other (See Comments)   Seizures   Tramadol Other (See Comments)   Seizures        Medication List     STOP taking these medications    Incruse Ellipta 62.5 MCG/ACT Aepb Generic drug: umeclidinium bromide       TAKE these medications    acetaminophen 325 MG tablet Commonly known as: TYLENOL Take 650 mg by mouth every 6 (six) hours as needed for moderate pain or headache.   Advair Diskus 100-50 MCG/ACT Aepb Generic drug: fluticasone-salmeterol Inhale 1 puff into the lungs 2 (two) times daily.   albuterol (2.5 MG/3ML) 0.083% nebulizer solution Commonly known as: PROVENTIL Use 1 vial (2.5 mg total) by nebulization every 6 (six) hours as needed for wheezing or shortness of breath.   Ventolin HFA 108 (90 Base) MCG/ACT inhaler Generic drug: albuterol Inhale 2 puffs into the lungs every 6 (six) hours as needed for wheezing or shortness of breath   Aspirin Low Dose 81 MG EC tablet Generic drug: aspirin Take 1 tablet (81 mg total) by mouth daily.   atorvastatin 40 MG tablet Commonly known as: LIPITOR Take 1 tablet (40 mg total) by mouth daily.   azithromycin 250 MG tablet Commonly known as: ZITHROMAX Complete course with one pill on 01/27. Start taking on: May 15, 2021   cefdinir 300 MG capsule Commonly known as: OMNICEF Take 2 capsules (600 mg total) by mouth daily for 3 days. Start taking on:  May 15, 2021   furosemide 40 MG tablet Commonly known as: Lasix Take 1 tablet (40 mg total) by mouth daily.   lisinopril 10 MG tablet Commonly known as: ZESTRIL Take 1 tablet (10 mg total) by mouth daily.   metoprolol succinate 25 MG 24 hr tablet Commonly known as: TOPROL-XL Take 1 tablet (25 mg total) by mouth in the morning.   nicotine 14 mg/24hr patch Commonly known as: NICODERM CQ - dosed in mg/24 hours Place 1 patch (14 mg total) onto the skin daily.   nicotine polacrilex 2 MG gum Commonly known as: NICORETTE Chew 1 piece (2 mg total) of gum as needed and as directed for smoking cessation.   nitroGLYCERIN 0.4 MG SL tablet Commonly known as: NITROSTAT Place 1 tablet (0.4 mg total) under the tongue every 5 (five) minutes x 3 doses as needed for chest pain.   predniSONE 20 MG tablet Commonly known as: DELTASONE Take 2 tablets (40 mg total) by mouth daily with breakfast for 2 days. Start taking on: May 15, 2021   Spiriva HandiHaler 18 MCG inhalation capsule Generic drug: tiotropium Place 1 capsule (18 mcg total) into inhaler and inhale daily.               Durable Medical Equipment  (From admission, onward)           Start     Ordered   05/14/21 1314  DME Oxygen  Once       Question Answer Comment  Length of Need 6  Months   Liters per Minute 2   Oxygen delivery system Gas      05/14/21 1314   05/14/21 1247  DME Oxygen  Once       Question Answer Comment  Length of Need 6 Months   Liters per Minute 2   Oxygen delivery system Gas      05/14/21 1247            Disposition and follow-up:   [name] was discharged from [facility] in Stable condition.  At the hospital follow up visit please address:  COPD exacerbation/ CAP Thought to be 2/2 to continued tobacco use. He endorses adherence to inhalers. Discharged on 2L supplemental O2, spiriva, symbicort, and albuterol. He has had 9 ED visits in last year, may  benefit from pulm referral. Encourage tobacco cessation.  Bilateral Basilar Nodules CT chest showed discrete nodular areas likely related to infection or inflammation. Patient has extensive smoking history and IV drug use. Nodules not typical for septic emboli with no cavitation seen on imaging.  Follow-up imaging in 8-12 weeks. Please f/u on blood cultures.  Chronic Hep C Patient will need follow up with outpatient ID for treatment.  Labs / imaging needed at time of follow-up: BMP, CBC  Pending labs/ test needing follow-up: Blood culture  Follow-up Appointments:  Follow-up Information     [facility] Follow up.   Why: Oxygen will be delivered to patient's room by [employer] representative. Contact information: [place] [address] [place] Hospital Course by problem list: COPD exacerbation Community Acquired Pneumonia Patient presented with shortness of breath, cough, and sputum production. He has had 9 ED visits in last several months 2/2 to SOB. He endorsed adherence with Spiriva, Symbicort, and albuterol. He was using nebulizer at home due to SOB and did not notice and improvement.   Labs demonstrated leukocytosis of 21.6. CXR showed potential right lower lobe nodule and presence of bilateral basilar pneumonia. CT showed multifocal bibasilar and middle lobe pneumonia, discrete nodular area likely related to infection or inflammation, with recommendation for follow-up in 8-12 weeks to ensure resolution. Patient was started on Prednisone 40 mg qd, ceftriaxone and azithromycin. He received 2 days medication inpatient. Ambulatory saturations of 87% on 2L, patient was sent home with supplemental oxygen. Prednisone continued for completion of 5 day course, cefdinir continued at discharge to complete 7 day course of cephalosporin, azithromycin continued for total of 3 days. Patient stated that he had Incruse, spiriva, symbicort, and albuterol  at home. He states that he finds the Spiriva easier to take and we asked him to discontinue Incruse to avoid dual therapy.  Bilateral Basilar Nodules CT chest showed discrete nodular areas likely related to infection or inflammation. Patient has extensive smoking history and IV drug use. Nodules not typical for septic emboli with no cavitation seen on imaging. Nodularity less likely 2/2 to septic emboli, but correlate with blood cultures.    Chronic Hepatitis C Recent 04/19/21 labs showing Hep C positive with quantification 5.5 million. He will need outpatient ID follow-up for treatment.  PAD HLD Atorvastatin 40 mg restarted for secondary prevention with goal of LDL<70.   Tobacco use disorder Patient has decreased smoking from 2 packs to 5 cigarettes per day. He states that he is trying to quit.  Chronic systolic and diastolic heart failure Nonischemic dilated cardiomyopathy CAD s/p stents to RCA  Last cath 2021 with restenosis of RCA. Repeat TTE showed EF of 60-65% with mild left ventricular hypertrophy which is improved from ECHO 04/22.  HTN Lisinopril held at d/c earlier this month. Patient was hypertensive to 150/ 70s during admission. Please reevaluate at follow-up.  Methamphetamine abuse  Discharge Exam:   BP (!) 152/105 (BP Location: Left Arm)    Pulse 95    Temp (!) 97.5 F (36.4 C) (Oral)    Resp 19    Ht 6' (1.829 m)    Wt 89 kg    SpO2 100%    BMI 26.61 kg/m  Discharge exam:  Constitutional: well-appearing, sitting in bed, in no acute distress HENT: normocephalic atraumatic, mucous membranes moist Eyes: conjunctiva non-erythematous Neck: supple Cardiovascular: regular rate and rhythm, no m/r/g Pulmonary/Chest: normal work of breathing on room air, crackles present in bases of lungs bilaterally Abdominal: soft, non-tender, non-distended MSK: normal bulk and tone Neurological: alert & oriented x 3, 5/5 strength in bilateral upper and lower extremities, normal gait Skin:  warm and dry Psych: normal mood and affect   Pertinent Labs, Studies, and Procedures:  BMP Latest Ref Rng & Units 05/14/2021 05/13/2021 05/12/2021  Glucose 70 - 99 mg/dL 115(H) 82 120(H)  BUN 6 - 20 mg/dL 18 14 15   Creatinine 0.61 - 1.24 mg/dL 1.33(H) 1.10 1.27(H)  BUN/Creat Ratio 10 - 24 - - -  Sodium 135 - 145 mmol/L 140 133(L) 139  Potassium 3.5 - 5.1 mmol/L 4.8 4.1 4.1  Chloride 98 - 111 mmol/L 102 103 97(L)  CO2 22 - 32 mmol/L 30 22 27   Calcium 8.9 - 10.3 mg/dL 9.2 8.4(L) 9.6    CBC Latest Ref Rng & Units 05/14/2021 05/13/2021 05/12/2021  WBC 4.0 - 10.5 K/uL 13.1(H) 17.2(H) 21.6(H)  Hemoglobin 13.0 - 17.0 g/dL 16.6 14.8 16.3  Hematocrit 39.0 - 52.0 % 50.1 46.3 49.0  Platelets 150 - 400 K/uL 223 196 254    CT chest 05/13/21: IMPRESSION: 1. Multifocal bibasilar and middle lobe pneumonia. 2. Discrete nodular areas likely related to infection or inflammation. Given patient history of COPD would suggest follow-up in 8-12 weeks to ensure resolution. 3. Also given nodularity would suggest blood culture correlation though findings are not typical for septic emboli with no cavitation seen on current imaging. 4. Generalized aortic atherosclerosis and signs of three-vessel coronary artery disease.  Discharge Instructions: Discharge Instructions     Diet - low sodium heart healthy   Complete by: As directed    Diet - low sodium heart healthy   Complete by: As directed    Discharge instructions   Complete by: As directed    Mr. Francisco, [name] were recently admitted to [facility] for COPD exacerbation and pneumonia.   Continue taking your home medications with the following changes: Start taking -Prednisone 40 mg (2 tabs) once daily for next three days -Azithromycin for one additional dose -Cefdinir 600 mg once daily until 01/30 Stop taking -Incruse Ellipta inhaler Continue taking -Advair two times daily -Spiriva once daily -Albuterol as needed  You should seek  further medical care if you have worsening shortness of breath or chest pain. We are sending you home with supplemental oxygen. Please follow-up at the Internal Medicine Clinic on 02/02 09:15. We are so glad that you are feeling better.  Sincerely, Kitana Gage, DO   Increase activity slowly   Complete by: As directed    Increase activity slowly   Complete by: As directed  SignedChristiana Fuchs, DO 05/14/2021, 4:42 PM   Pager: [phone]

## 2021-05-14 NOTE — Progress Notes (Signed)
SATURATION QUALIFICATIONS: (This note is used to comply with regulatory documentation for home oxygen)  Patient Saturations on Room Air at Rest = 97%  Patient Saturations on Room Air while Ambulating = 87%  Patient Saturations on 2 Liters of oxygen while Ambulating = 95%  Please briefly explain why patient needs home oxygen:   Addison Lank Mobility Specialist  Phone [phone]

## 2021-05-14 NOTE — Progress Notes (Signed)
Mobility Specialist Progress Note:   05/14/21 1100  Mobility  Activity Ambulated independently in hallway  Level of Assistance Independent  Assistive Device None  Distance Ambulated (ft) 300 ft  Activity Response Tolerated fair  $Mobility charge 1 Mobility   Pt with SOB during ambulation. Sats dropping to 87% on RA, recovered to 95% on 2LO2. Pt back sitting EOB with all needs met.   Nelta Numbers Mobility Specialist  Phone [phone]

## 2021-05-15 ENCOUNTER — Telehealth: Payer: Self-pay | Admitting: *Deleted

## 2021-05-15 ENCOUNTER — Ambulatory Visit (INDEPENDENT_AMBULATORY_CARE_PROVIDER_SITE_OTHER): Payer: Self-pay | Admitting: Internal Medicine

## 2021-05-15 DIAGNOSIS — R042 Hemoptysis: Secondary | ICD-10-CM

## 2021-05-15 NOTE — Telephone Encounter (Signed)
Patient called in stating he was d/c from hospital yesterday for COPD, PNA, and lung nodules. States last evening he had one episode of coughing up "dark red mucous." Has had 3 more episodes this AM. No other sx. Please advise.

## 2021-05-15 NOTE — Telephone Encounter (Signed)
Pneumonia and bronchitis can commonly cause small volume hemoptysis. We should make a follow up appointment with him early next week. Most likely this will resolve as he continues his course of antibiotics. If he has worsening volume of hemoptysis, or worsening dyspnea over the weekend, he should go to the ED.

## 2021-05-15 NOTE — Chronic Care Management (AMB) (Signed)
°  Care Management   Note  05/15/2021 Name: Francisco [name] MRN: [id number] DOB: 1960/10/15  Francisco [name] is a 61 y.o. year old male who is a primary care patient of Adron Bene, MD. I reached out to [name] by phone today in response to a referral sent by [name].   [name] was given information about care management services today including:  Care management services include personalized support from designated clinical staff supervised by his physician, including individualized plan of care and coordination with other care providers 24/7 contact phone numbers for assistance for urgent and routine care needs. The patient may stop care management services at any time by phone call to the office staff.  Patient agreed to services and verbal consent obtained.   Follow up plan: Telephone appointment with care management team member scheduled for:05/18/21  [facility] Guide, Embedded Care Coordination [facility]   Care Management  Direct Dial: [phone]

## 2021-05-16 LAB — LEGIONELLA PNEUMOPHILA SEROGP 1 UR AG: L. pneumophila Serogp 1 Ur Ag: NEGATIVE

## 2021-05-17 DIAGNOSIS — R042 Hemoptysis: Secondary | ICD-10-CM | POA: Insufficient documentation

## 2021-05-17 LAB — CULTURE, BLOOD (ROUTINE X 2)
Culture: NO GROWTH
Culture: NO GROWTH

## 2021-05-17 NOTE — Progress Notes (Signed)
°  [facility] Internal Medicine Residency Telephone Encounter Continuity Care Appointment  HPI:  This telephone encounter was created for [name] on 05/17/2021 for the following purpose/cc: "coughing up blood". Please refer to problem based charting for HPI details, assessment, and plan.   Assessment / Plan / Recommendations:    Hemoptysis    Pt was recently admitted for CAP with COPD exacerbation. Discharged 1/27. Initial episode of hemoptysis occurred the evening of discharge, after returning home. He notes dark red in his sputum. Since then, he has experienced 3 additional episodes that occurred this morning, that he reports was slightly more volume and thicker. He denies chest pain, shortness of breath, dizziness, or lightheadedness. He did not experience hemoptysis while in the hospital.  Primary differential dx includes sequele of bronchitis vs malignancy (multiple lung nodules notable on CTA). I would not suspect some bronchial irritation to be significant enough to be causing what sounds like clotted blood, although he was unable to upload photos of it.   I recommended he come into the clinic for an in-person evaluation however he declined, stating he is heading to [place] to visit some family. I recommended he seek evaluation in an urgent care, however he declines as he is uninsured and does not have the financial means.  He was agreeable to arranging an office visit with our clinic next week. Unfortunately, any kind of diagnostic workup will be complicated by his financial limitations however it will be helpful to see the hemoptysis in person.  He was instructed to seek immediate evaluation in the ED should he develop increasing frequency/volume of the hemoptysis, worsening shortness of breath, chest pain or any additional symptoms. He expressed understanding and was in agreement with this.          Consent and Medical Decision Making:  Patient discussed with Dr.  Lafonda Mosses This  is a telephone encounter between [facility] and [employer] on 05/17/2021 for hemoptysis. The visit was conducted with the patient located at home and [employer] at [facility]. The patient's identity was confirmed using their DOB and current address. The patient has consented to being evaluated through a telephone encounter and understands the associated risks (an examination cannot be done and the patient may need to come in for an appointment) / benefits (allows the patient to remain at home, decreasing exposure to coronavirus). I personally spent 30 minutes on medical discussion.     Elige Radon, MD Internal Medicine Resident PGY-3 [facility] 05/17/2021 1:11 AM

## 2021-05-17 NOTE — Assessment & Plan Note (Signed)
Pt was recently admitted for CAP with COPD exacerbation. Discharged 1/27. Initial episode of hemoptysis occurred the evening of discharge, after returning home. He notes dark red in his sputum. Since then, he has experienced 3 additional episodes that occurred this morning, that he reports was slightly more volume and thicker. He denies chest pain, shortness of breath, dizziness, or lightheadedness.  Primary differential dx includes sequele of bronchitis vs malignancy (multiple lung nodules notable on CTA). I would not suspect some bronchial irritation to be significant enough to be causing what sounds like clotted blood, although he was unable to upload photos of it.   I recommended he come into the clinic for an in-person evaluation however he declined, stating he is heading to [place] to visit some family. I recommended he seek evaluation in an urgent care, however he declines as he is uninsured and does not have the financial means.   He was agreeable to arranging an office visit with our clinic next week. Unfortunately, any kind of diagnostic workup will be complicated by his financial limitations however it will be helpful to see the hemoptysis in person.   He was instructed to seek immediate evaluation in the ED should he develop increasing frequency/volume of the hemoptysis, worsening shortness of breath, chest pain or any additional symptoms. He expressed understanding and was in agreement with this.

## 2021-05-18 ENCOUNTER — Telehealth: Payer: Self-pay | Admitting: Licensed Clinical Social Worker

## 2021-05-18 ENCOUNTER — Ambulatory Visit (INDEPENDENT_AMBULATORY_CARE_PROVIDER_SITE_OTHER): Payer: Self-pay | Admitting: Student

## 2021-05-18 VITALS — BP 116/83 | HR 96 | Temp 98.3°F | Ht 72.0 in | Wt 188.8 lb

## 2021-05-18 DIAGNOSIS — J449 Chronic obstructive pulmonary disease, unspecified: Secondary | ICD-10-CM

## 2021-05-18 DIAGNOSIS — R042 Hemoptysis: Secondary | ICD-10-CM

## 2021-05-18 DIAGNOSIS — Z5989 Other problems related to housing and economic circumstances: Secondary | ICD-10-CM

## 2021-05-18 DIAGNOSIS — N179 Acute kidney failure, unspecified: Secondary | ICD-10-CM

## 2021-05-18 LAB — D-DIMER, QUANTITATIVE: D-Dimer, Quant: <0.27 ug/mL-FEU (ref 0.00–0.50)

## 2021-05-18 NOTE — Telephone Encounter (Signed)
°  Care Management   Follow Up Note   05/18/2021 Name: Francisco [name] MRN: [id number] DOB: Apr 25, 1960   Referred by: Delene Ruffini, MD Reason for referral : No chief complaint on file.   An unsuccessful telephone outreach was attempted today. The patient was referred to the case management team for assistance with care management and care coordination.   Follow Up Plan: The care management team will reach out to the patient again over the next 7 days.   Milus Height, [place]  Social Worker [facility] Care Management  [phone]

## 2021-05-18 NOTE — Patient Instructions (Signed)
It was a pleasure seeing you in clinic. Today we discussed:   Coughing up blood I have ordered a scan, you will be called about this for scheduling and billing We will also check labs, I will call with the results Please continue to wear O2 at home Follow up in 2 weeks   If you have any questions or concerns, please call our clinic at [phone] between 9am-5pm and after hours call [phone] and ask for the internal medicine resident on call. If you feel you are having a medical emergency please call 911.   Thank you, we look forward to helping you remain healthy!

## 2021-05-18 NOTE — Progress Notes (Signed)
Internal Medicine Clinic Attending  Case discussed with Dr. Ephriam Knuckles  At the time of the visit.  We reviewed the residents history and exam and pertinent patient test results.  I agree with the assessment, diagnosis, and plan of care documented in the residents note.   I share Dr. Luciana Axe concerns about Mr. Nickles hemoptysis. I have confirmed that he has an in-person appointment scheduled on 05/18/21 with Dr. Elaina Pattee. At that visit, please try to quantify his hemoptysis better, patient has pictures of what he coughed up.

## 2021-05-19 LAB — BMP8+ANION GAP
Anion Gap: 17 mmol/L (ref 10.0–18.0)
BUN/Creatinine Ratio: 15 (ref 10–24)
BUN: 18 mg/dL (ref 8–27)
CO2: 25 mmol/L (ref 20–29)
Calcium: 10.5 mg/dL — ABNORMAL HIGH (ref 8.6–10.2)
Chloride: 99 mmol/L (ref 96–106)
Creatinine, Ser: 1.23 mg/dL (ref 0.76–1.27)
Glucose: 97 mg/dL (ref 70–99)
Potassium: 4.6 mmol/L (ref 3.5–5.2)
Sodium: 141 mmol/L (ref 134–144)
eGFR: 67 mL/min/{1.73_m2} (ref >59)

## 2021-05-19 LAB — CBC WITH DIFFERENTIAL/PLATELET
Basophils Absolute: 0 10*3/uL (ref 0.0–0.2)
Basos: 0 %
EOS (ABSOLUTE): 0 10*3/uL (ref 0.0–0.4)
Eos: 0 %
Hematocrit: 51.9 % — ABNORMAL HIGH (ref 37.5–51.0)
Hemoglobin: 17.5 g/dL (ref 13.0–17.7)
Immature Grans (Abs): 0.1 10*3/uL (ref 0.0–0.1)
Immature Granulocytes: 1 %
Lymphocytes Absolute: 1.7 10*3/uL (ref 0.7–3.1)
Lymphs: 14 %
MCH: 31.3 pg (ref 26.6–33.0)
MCHC: 33.7 g/dL (ref 31.5–35.7)
MCV: 93 fL (ref 79–97)
Monocytes Absolute: 0.5 10*3/uL (ref 0.1–0.9)
Monocytes: 4 %
Neutrophils Absolute: 9.6 10*3/uL — ABNORMAL HIGH (ref 1.4–7.0)
Neutrophils: 81 %
Platelets: 316 10*3/uL (ref 150–450)
RBC: 5.6 x10E6/uL (ref 4.14–5.80)
RDW: 12.1 % (ref 11.6–15.4)
WBC: 11.9 10*3/uL — ABNORMAL HIGH (ref 3.4–10.8)

## 2021-05-19 NOTE — Assessment & Plan Note (Signed)
He is continue to work on orange card paperwork.  Encouraged him to fill this out and send it in given multiple recent health issues.

## 2021-05-19 NOTE — Progress Notes (Signed)
° °  CC: hemoptysis   HPI:  [name] is a 61 y.o. M with past medical history below presents for follow up of hemoptysis. Please refer to problem based charting for further details and assessment and plan of current problem and chronic medical conditions.    Past Medical History:  Diagnosis Date   CHF (congestive heart failure) (HCC)    COPD (chronic obstructive pulmonary disease) (HCC)    Coronary artery disease    a. s/p prior PCI.   Hepatitis-C    Hypertension    IV drug abuse (HCC)    a. 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   Medically noncompliant    MI (myocardial infarction) (HCC)    Tobacco abuse    Review of Systems:  negative as per HPI  Physical Exam:  Vitals:   05/18/21 1414 05/18/21 1627 05/18/21 1628 05/18/21 1629  BP: 117/90 113/80 127/88 116/83  Pulse: 99 (!) 101 97 96  Temp: 98.3 F (36.8 C)     TempSrc: Oral     SpO2: 97%   (!) 84%  Weight: 188 lb 12.8 oz (85.6 kg)     Height: 6' (1.829 m)      Physical Exam Constitutional:      Appearance: Normal appearance.     Comments: Chronically ill appearing   HENT:     Right Ear: External ear normal.     Left Ear: External ear normal.     Mouth/Throat:     Mouth: Mucous membranes are dry.     Pharynx: Oropharynx is clear.  Eyes:     Extraocular Movements: Extraocular movements intact.     Pupils: Pupils are equal, round, and reactive to light.  Cardiovascular:     Rate and Rhythm: Normal rate and regular rhythm.     Pulses: Normal pulses.     Heart sounds: No murmur heard.   No friction rub. No gallop.  Pulmonary:     Effort: Pulmonary effort is normal.     Breath sounds: Normal breath sounds.     Comments: No crackles, diminished bilaterally, mild expiratory wheezing of anterior lung fields improved after coughing  Abdominal:     General: Abdomen is flat. Bowel sounds are normal. There is no distension.     Palpations: Abdomen is soft.     Tenderness: There is no abdominal  tenderness.  Musculoskeletal:     Right lower leg: No edema.     Left lower leg: No edema.     Comments: No calf tenderness  Skin:    General: Skin is warm and dry.     Capillary Refill: Capillary refill takes less than 2 seconds.  Neurological:     General: No focal deficit present.     Mental Status: He is alert and oriented to person, place, and time.     Assessment & Plan:   See Encounters Tab for problem based charting.  Patient discussed with Dr. Antony Contras

## 2021-05-19 NOTE — Progress Notes (Signed)
Internal Medicine Clinic Attending ? ?Case discussed with Dr. Liang  At the time of the visit.  We reviewed the resident?s history and exam and pertinent patient test results.  I agree with the assessment, diagnosis, and plan of care documented in the resident?s note. ? ?

## 2021-05-19 NOTE — Assessment & Plan Note (Signed)
AKI during recent hospitalization, improvement in sCR down to 1.23 on repeat BMP today.

## 2021-05-19 NOTE — Assessment & Plan Note (Signed)
>>  ASSESSMENT AND PLAN FOR COPD (CHRONIC OBSTRUCTIVE PULMONARY DISEASE) (HCC) WRITTEN ON 05/19/2021  1:16 PM BY LEMON RAISIN, MD  Recently discharged on 1/27 for COPD exacerbation secondary to community-acquired pneumonia.  Was found to have incidental right lung nodule.  Underwent CT chest without contrast with multiple nodularities in the bilateral lungs which could be secondary to pneumonia versus other etiology.  Overall he notes breathing has been improving since being home.  Today has had some cough with sputum.  Is able to tolerate more walking with the oxygen .  Continue Spiriva  and Advair .  Has been needing albuterol  no more than once daily since discharge.  Was discharged with 2 L of oxygen .  He was treated with prednisone , azithromycin , and ceftriaxone .  Was discharged with 1 more day of prednisone  and azithromycin  which is finished.  As well as 3 additional days of cefdinir  which he reports he has 1 more day.  He unfortunately did not bring his oxygen  today as it was difficult to travel with.  We ambulated him and O2 was noted to drop to 84% without oxygen .  Maintain oxygen  saturation 100% on 2 L.  Advised him to wear his oxygen  and keep it with him when he is going out.  Unfortunately does not have a pulse ox at home and cannot afford one at this time.  Plan Continue 2 L oxygen  Continue Advair , Spiriva , and albuterol  as needed Finish cefdinir  CBC with improving leukocytosis Follow-up in 2 weeks to see if we can wean him off oxygen

## 2021-05-19 NOTE — Assessment & Plan Note (Addendum)
Patient presents for follow-up of hemoptysis.  States he had 1 episode of coughing and coughed up a cranberry colored blood the night he was discharged on 1/27.  Subsequently had 4 episodes the next day.  Yesterday had 1 episode.  No further hemoptysis today.  He did bring pictures to clinic it looks like dark color clots.  Does not appear to be large volume.  Denies any streaking of blood or bright red blood in his sputum.  Given recent finding of multiple lung nodules on CT chest without contrast on 1/25 with significant history of smoking about 2 packs a day for 40 years concerning for lung malignancy which could be causing this, other differential includes bronchitis, PE which seem less likely at this time.  Vitals appear stable today.  Orthostatics negative.  No calf tenderness.  Overall shortness of breath is improving.  Patient is uninsured.  Discussed that we would like to have a contrast study of his lung to better evaluate his hemoptysis.  Does report he had seizures after myelogram in the past.  This is unclear whether this is a true iodine allergy.  Will order CT chest with contrast discussed with radiology if he needs premedications.  Plan CT chest with contrast CBC, BMP, D-dimer  Addendum  CBC with improving leukocytosis and neutrophil predominance likely secondary recent community-acquired pneumonia. BMP with elevated calcium with history of low albumin on recent labs, unable to get PTH as it needs to be on ice, will repeat calcium with albumin and obtain PTH levels at next visit D-dimer negative Will need premedications prior to CT chest on 2/8 will order prednisone and benadryl for 13 hour protocol, medications sent to pharmacy, attempted to call patient x3 left HIPPA compliant voicemail for patient to call back regarding this

## 2021-05-19 NOTE — Assessment & Plan Note (Signed)
Recently discharged on 1/27 for COPD exacerbation secondary to community-acquired pneumonia.  Was found to have incidental right lung nodule.  Underwent CT chest without contrast with multiple nodularities in the bilateral lungs which could be secondary to pneumonia versus other etiology.  Overall he notes breathing has been improving since being home.  Today has had some cough with sputum.  Is able to tolerate more walking with the oxygen.  Continue Spiriva and Advair.  Has been needing albuterol no more than once daily since discharge.  Was discharged with 2 L of oxygen.  He was treated with prednisone, azithromycin, and ceftriaxone.  Was discharged with 1 more day of prednisone and azithromycin which is finished.  As well as 3 additional days of cefdinir which he reports he has 1 more day.  He unfortunately did not bring his oxygen today as it was difficult to travel with.  We ambulated him and O2 was noted to drop to 84% without oxygen.  Maintain oxygen saturation 100% on 2 L.  Advised him to wear his oxygen and keep it with him when he is going out.  Unfortunately does not have a pulse ox at home and cannot afford one at this time.  Plan Continue 2 L oxygen Continue Advair, Spiriva, and albuterol as needed Finish cefdinir CBC with improving leukocytosis Follow-up in 2 weeks to see if we can wean him off oxygen

## 2021-05-21 ENCOUNTER — Encounter: Payer: Self-pay | Admitting: Internal Medicine

## 2021-05-25 ENCOUNTER — Other Ambulatory Visit (HOSPITAL_COMMUNITY): Payer: Self-pay

## 2021-05-25 MED ORDER — PREDNISONE 50 MG PO TABS
ORAL_TABLET | ORAL | 0 refills | Status: DC
  Filled 2021-05-25: qty 3, fill #0

## 2021-05-25 MED ORDER — DIPHENHYDRAMINE HCL 50 MG PO TABS
50.0000 mg | ORAL_TABLET | Freq: Once | ORAL | 0 refills | Status: DC
  Filled 2021-05-25: qty 1, 1d supply, fill #0

## 2021-05-25 MED ORDER — PREDNISONE 50 MG PO TABS
ORAL_TABLET | ORAL | 0 refills | Status: DC
  Filled 2021-05-25: qty 3, 1d supply, fill #0

## 2021-05-25 NOTE — Addendum Note (Signed)
Addended by: Iona Beard on: 05/25/2021 01:47 PM   Modules accepted: Orders

## 2021-05-26 ENCOUNTER — Telehealth: Payer: Self-pay | Admitting: *Deleted

## 2021-05-26 ENCOUNTER — Other Ambulatory Visit: Payer: Self-pay | Admitting: Internal Medicine

## 2021-05-26 ENCOUNTER — Other Ambulatory Visit (HOSPITAL_COMMUNITY): Payer: Self-pay

## 2021-05-26 ENCOUNTER — Encounter: Payer: Self-pay | Admitting: Internal Medicine

## 2021-05-26 NOTE — Chronic Care Management (AMB) (Signed)
°  Care Management   Note  05/26/2021 Name: Francisco [name] MRN: [id number] DOB: 11-Mar-1961  [name] is a 61 y.o. year old male who is a primary care patient of Adron Bene, MD and is actively engaged with the care management team. I reached out to [name] by phone today to assist with re-scheduling an initial visit with the BSW  Follow up plan: Unsuccessful telephone outreach attempt made. A HIPAA compliant phone message was left for the patient providing contact information and requesting a return call.  The care management team will reach out to the patient again over the next 7 days.  If patient returns call to provider office, please advise to call Embedded Care Management Care Guide Misty Stanley at [phone].  Gwenevere Ghazi  Care Guide, Embedded Care Coordination [facility] Management  Direct Dial: [phone]

## 2021-05-27 ENCOUNTER — Ambulatory Visit (HOSPITAL_COMMUNITY): Admission: RE | Admit: 2021-05-27 | Payer: Self-pay | Source: Ambulatory Visit

## 2021-05-27 ENCOUNTER — Other Ambulatory Visit (HOSPITAL_COMMUNITY): Payer: Self-pay

## 2021-05-29 ENCOUNTER — Inpatient Hospital Stay: Payer: Self-pay | Admitting: Internal Medicine

## 2021-05-30 MED ORDER — FLUTICASONE-SALMETEROL 100-50 MCG/ACT IN AEPB
1.0000 | INHALATION_SPRAY | Freq: Two times a day (BID) | RESPIRATORY_TRACT | 0 refills | Status: DC
Start: 2021-05-30 — End: 2021-06-24
  Filled 2021-05-30: qty 60, 30d supply, fill #0

## 2021-06-01 ENCOUNTER — Other Ambulatory Visit (HOSPITAL_COMMUNITY): Payer: Self-pay

## 2021-06-02 NOTE — Chronic Care Management (AMB) (Signed)
°  Care Management   Note  06/02/2021 Name: Francisco [name] MRN: [id number] DOB: April 15, 1961  [name] is a 61 y.o. year old male who is a primary care patient of Delene Ruffini, MD and is actively engaged with the care management team. I reached out to Beau Fanny by phone today to assist with re-scheduling an initial visit with the BSW  Follow up plan: Unsuccessful telephone outreach attempt made. A HIPAA compliant phone message was left for the patient providing contact information and requesting a return call.  The care management team will reach out to the patient again over the next 7 days.  If patient returns call to provider office, please advise to call [place] at [phone].  [facility] Management  Direct Dial: [phone]

## 2021-06-09 ENCOUNTER — Other Ambulatory Visit (HOSPITAL_COMMUNITY): Payer: Self-pay

## 2021-06-09 NOTE — Chronic Care Management (AMB) (Signed)
°  Care Management   Note  06/09/2021 Name: Cartrell Bentsen MRN: [id number] DOB: 1961/01/22  Raiford Fetterman is a 61 y.o. year old male who is a primary care patient of Adron Bene, MD and is actively engaged with the care management team. I reached out to [name] by phone today to assist with re-scheduling an initial visit with the BSW  Follow up plan: A third unsuccessful telephone outreach attempt made. A HIPAA compliant phone message was left for the patient providing contact information and requesting a return call. Unable to make contact on outreach attempts x 3. PCP Adron Bene, MD notified via routed documentation in medical record. We have been unable to make contact with the patient for follow up. The care management team is available to follow up with the patient after provider conversation with the patient regarding recommendation for care management engagement and subsequent re-referral to the care management team.   [facility] Guide, Embedded Care Coordination [facility]   Care Management  Direct Dial: [phone]

## 2021-06-24 ENCOUNTER — Encounter: Payer: Self-pay | Admitting: Internal Medicine

## 2021-06-24 ENCOUNTER — Ambulatory Visit (INDEPENDENT_AMBULATORY_CARE_PROVIDER_SITE_OTHER): Payer: Self-pay | Admitting: Internal Medicine

## 2021-06-24 ENCOUNTER — Other Ambulatory Visit (HOSPITAL_COMMUNITY): Payer: Self-pay

## 2021-06-24 DIAGNOSIS — M545 Low back pain, unspecified: Secondary | ICD-10-CM

## 2021-06-24 DIAGNOSIS — R918 Other nonspecific abnormal finding of lung field: Secondary | ICD-10-CM

## 2021-06-24 DIAGNOSIS — B182 Chronic viral hepatitis C: Secondary | ICD-10-CM

## 2021-06-24 DIAGNOSIS — Z720 Tobacco use: Secondary | ICD-10-CM

## 2021-06-24 DIAGNOSIS — M549 Dorsalgia, unspecified: Secondary | ICD-10-CM | POA: Insufficient documentation

## 2021-06-24 DIAGNOSIS — I5043 Acute on chronic combined systolic (congestive) and diastolic (congestive) heart failure: Secondary | ICD-10-CM

## 2021-06-24 DIAGNOSIS — J449 Chronic obstructive pulmonary disease, unspecified: Secondary | ICD-10-CM

## 2021-06-24 DIAGNOSIS — Z5989 Other problems related to housing and economic circumstances: Secondary | ICD-10-CM

## 2021-06-24 DIAGNOSIS — B1920 Unspecified viral hepatitis C without hepatic coma: Secondary | ICD-10-CM | POA: Insufficient documentation

## 2021-06-24 MED ORDER — LISINOPRIL 10 MG PO TABS
10.0000 mg | ORAL_TABLET | Freq: Every day | ORAL | 11 refills | Status: DC
  Filled 2021-06-24: qty 30, 30d supply, fill #0

## 2021-06-24 MED ORDER — FLUTICASONE-SALMETEROL 100-50 MCG/ACT IN AEPB
1.0000 | INHALATION_SPRAY | Freq: Two times a day (BID) | RESPIRATORY_TRACT | 0 refills | Status: DC
  Filled 2021-06-24: qty 60, 30d supply, fill #0

## 2021-06-24 MED ORDER — METOPROLOL SUCCINATE ER 25 MG PO TB24
25.0000 mg | ORAL_TABLET | Freq: Every morning | ORAL | 2 refills | Status: DC
Start: 2021-06-24 — End: 2021-09-21
  Filled 2021-06-24: qty 30, 30d supply, fill #0
  Filled 2021-07-22: qty 30, 30d supply, fill #1
  Filled 2021-08-24: qty 30, 30d supply, fill #2

## 2021-06-24 MED ORDER — ATORVASTATIN CALCIUM 40 MG PO TABS
40.0000 mg | ORAL_TABLET | Freq: Every day | ORAL | 2 refills | Status: DC
  Filled 2021-06-24: qty 30, 30d supply, fill #0
  Filled 2021-07-22: qty 30, 30d supply, fill #1
  Filled 2021-08-24: qty 30, 30d supply, fill #2

## 2021-06-24 MED ORDER — PREDNISONE 50 MG PO TABS
50.0000 mg | ORAL_TABLET | Freq: Every day | ORAL | 0 refills | Status: DC
  Filled 2021-06-24: qty 5, 5d supply, fill #0

## 2021-06-24 MED ORDER — NICOTINE 14 MG/24HR TD PT24
14.0000 mg | MEDICATED_PATCH | TRANSDERMAL | 1 refills | Status: DC
  Filled 2021-06-24: qty 28, 28d supply, fill #0

## 2021-06-24 MED ORDER — ALBUTEROL SULFATE (2.5 MG/3ML) 0.083% IN NEBU
2.5000 mg | INHALATION_SOLUTION | Freq: Four times a day (QID) | RESPIRATORY_TRACT | 2 refills | Status: DC | PRN
Start: 2021-06-24 — End: 2021-10-26
  Filled 2021-06-24: qty 300, 25d supply, fill #0
  Filled 2021-07-22: qty 180, 15d supply, fill #1
  Filled 2021-08-24: qty 180, 15d supply, fill #2
  Filled 2021-09-23: qty 300, 25d supply, fill #3

## 2021-06-24 MED ORDER — ALBUTEROL SULFATE HFA 108 (90 BASE) MCG/ACT IN AERS
2.0000 | INHALATION_SPRAY | Freq: Four times a day (QID) | RESPIRATORY_TRACT | 2 refills | Status: DC | PRN
Start: 2021-06-24 — End: 2021-08-24
  Filled 2021-06-24: qty 8.5, 25d supply, fill #0
  Filled 2021-07-22: qty 6.7, 25d supply, fill #1
  Filled 2021-07-22: qty 8.5, 25d supply, fill #1
  Filled 2021-08-06: qty 6.7, 25d supply, fill #2

## 2021-06-24 NOTE — Assessment & Plan Note (Signed)
Reports chronic pain for years with worsening past two weeks. Sharp pain improved with rest. No shooting pain. No numbness tingling or weakness, no incontinence. Improved with rest. Reports msk spasming.  ?- tender right lumbar paraspinal ?- no red flag signs ?- chronic in nature ?- scheduled tylenol and heat recommended ?

## 2021-06-24 NOTE — Assessment & Plan Note (Signed)
Patient reports that he had started [employer] paperwork, but that his roommate threw it out. Stressed to him the importance of insurance coverage so that appropriate follow up on his multiple medical issues can be done.  ?Patient states he understands the importance of having medical coverage for this. ?Given orange card paperwork to complete and return to office. ?

## 2021-06-24 NOTE — Assessment & Plan Note (Addendum)
Patient reports increasing clear sputum production.  Coughing more.  Constant wheeze as well.  He is also reporting increase in dyspnea and is not able to stand for the duration of his showers, he also is only able to walk about 100 feet before he starts feeling short of breath.  Denies fevers. ?Bilateral end expiratory wheeze on exam. ?He is on Spiriva, Symbicort, albuterol.  We will continue these medications.  Suspect COPD exacerbation given increase in sputum and cough.  Dyspnea possibly related to his decompensated CHF.  For COPD exacerbation we will give him short course of prednisone 50 mg once daily for 5 days.  Refilled home medications as well.  He would likely benefit from a referral to pulmonology, however he does not have insurance currently and would not be able to afford to the out of pocket cost. ?

## 2021-06-24 NOTE — Assessment & Plan Note (Signed)
>>  ASSESSMENT AND PLAN FOR TOBACCO ABUSE WRITTEN ON 06/24/2021  2:55 PM BY GAWALUCK, GREYLON, MD  Patient continues to smoke.  He smokes about 1/2 pack/day.  He is determined to quit.  He has tried Chantix in the past but reports intolerable mood swings.  He also has used nicotine  gum but reports that he hiccups uncontrollably with the gum and does not wish to use this at that time.  Additionally he has used nicotine  patches which he has noticed some benefit from. Patient would greatly benefit from smoking cessation, as this is likely also impacting his overall breathing status and contributing to his frequent COPD exacerbations.  Furthermore suspicious lung nodules were noted on chest imaging which will need following up. Patient is without insurance currently and cannot afford the nicotine  lozenges or nasal spray.  We will continue patches in the meantime.

## 2021-06-24 NOTE — Progress Notes (Signed)
? ?  CC: hospital fu ? ?HPI:[name] is a 61 y.o. male who presents for evaluation of follow up. Please see individual problem based A/P for details. ? ?Depression, PHQ-9: ?Based on the patients  ?Flowsheet Row Office Visit from 06/24/2021 in [facility]  ?PHQ-9 Total Score 5  ? ?  ? score we have 5. ? ?Past Medical History:  ?Diagnosis Date  ? CHF (congestive heart failure) (HCC)   ? COPD (chronic obstructive pulmonary disease) (HCC)   ? Coronary artery disease   ? a. s/p prior PCI.  ? Hepatitis-C   ? Hypertension   ? IV drug abuse (HCC)   ? a. 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  ? Medically noncompliant   ? MI (myocardial infarction) (HCC)   ? Tobacco abuse   ? ?Review of Systems:   ?Review of Systems  ?Constitutional: Negative.   ?HENT: Negative.    ?Respiratory:  Positive for cough, sputum production, shortness of breath and wheezing. Negative for hemoptysis.   ?Cardiovascular:  Positive for leg swelling.  ?Gastrointestinal: Negative.   ?Genitourinary: Negative.   ?Musculoskeletal: Negative.   ?Skin: Negative.   ?Neurological: Negative.   ?Psychiatric/Behavioral: Negative.     ? ?Physical Exam: ?There were no vitals filed for this visit. ? ? ?General: Alert and oriented no acute distress. ?HEENT: Conjunctiva nl , antiicteric sclerae, moist mucous membranes, no exudate or erythema, JVD to angle of mandible ?Cardiovascular: Normal rate, regular rhythm.  No murmurs, rubs, or gallops ?Pulmonary : End expiratory wheeze, no crackles ?Abdominal: soft, nontender,  bowel sounds present ?Ext: Bilateral 1-2+ pitting lower extremity edema.  Extremities warm and well perfused ? ?Assessment & Plan:  ? ?See Encounters Tab for problem based charting. ? ?Patient discussed with Dr.  Lafonda Mosses ? ?

## 2021-06-24 NOTE — Assessment & Plan Note (Signed)
>>  ASSESSMENT AND PLAN FOR COPD (CHRONIC OBSTRUCTIVE PULMONARY DISEASE) (HCC) WRITTEN ON 06/24/2021  2:47 PM BY GRIFFITH PORTER, MD  Patient reports increasing clear sputum production.  Coughing more.  Constant wheeze as well.  He is also reporting increase in dyspnea and is not able to stand for the duration of his showers, he also is only able to walk about 100 feet before he starts feeling short of breath.  Denies fevers. Bilateral end expiratory wheeze on exam. He is on Spiriva , Symbicort , albuterol .  We will continue these medications.  Suspect COPD exacerbation given increase in sputum and cough.  Dyspnea possibly related to his decompensated CHF.  For COPD exacerbation we will give him short course of prednisone  50 mg once daily for 5 days.  Refilled home medications as well.  He would likely benefit from a referral to pulmonology, however he does not have insurance currently and would not be able to afford to the out of pocket cost.

## 2021-06-24 NOTE — Assessment & Plan Note (Signed)
Hypercalcemia noted on last labs.  Hypercalcemia was noted after patient had left from clinic when labs resulted.  PTH and calcium with albumin were unable to be added on as this lab test needs to be done at the time, or blood needs to be kept on ice. ?PTH and calcium with albumin will need to be collected, however patient was not is without insurance at this point and is unable to afford lab work currently.  We will try to follow these up as soon as possible. ?

## 2021-06-24 NOTE — Assessment & Plan Note (Signed)
Bilateral bibasilar lung nodules.  Noted on previous chest imaging.  Recommended that this be followed up with repeat imaging, given history of tobacco use. ?Patient does not have insurance currently and is unable to afford CT scan.  Patient currently in process working for [employer] card and will plan to schedule chest CT once that is complete.  He will need premedication due to allergy. ?

## 2021-06-24 NOTE — Assessment & Plan Note (Signed)
Patient aware of hep C. Infectious disease follow up was recommended to patient. He states he has not established with them due to lack of insurance.  ?Plan to follow up with ID pending insurance coverage.  ?

## 2021-06-24 NOTE — Patient Instructions (Signed)
Dear Francisco [name], ? ?Thank you for trusting [place] with your care today. ? ?Today we discussed your COPD, heart failure, smoking, hep C, back pain, and insurance. ? ?For your COPD, based off of what you are telling us, we are concerned that you may be experiencing a COPD exacerbation.  We will give you a short course of prednisone to help improve your symptoms.  We will also refill your medications. ? ?For your heart failure, you are holding onto excess water.  It will be very important to take your medications, especially the Lasix.  Please take your Lasix daily as prescribed.  We would like to see you back in the clinic in 1 week to ensure that your body is responding appropriately to the medications, and that your weight and fluid status has improved.  We also recommend that you weigh yourself each day, and call us if your weight shows a significant increase over short period of time. ? ?For your tobacco use, please continue to use the nicotine patches.  Unfortunately the nicotine lozenges and nasal sprays are not available through the $4 program.  However once you are able to get insurance coverage we can look at starting you on those. ? ?For your lung nodules, we would like to get some imaging of your chest, however it will be very expensive without insurance.  Please ensure that you follow-up with the orange card paperwork so that we can order the scans of your chest. ? ?For your back pain, please use scheduled Tylenol as well as heat and ice.  You can take up to 1000 mg of Tylenol 4 times a day. ? ?Please return to the clinic in 1 week for a follow-up on your congestive heart failure. ?

## 2021-06-24 NOTE — Assessment & Plan Note (Addendum)
Patient has not been taking his Lasix routinely, reports at most he takes it every other day.  He does not take it because he does not like how often it makes him urinate.  He does report increasing dyspnea on exertion, able to only ambulate about 100 feet before he starts feeling short of breath.  Says this is interfering with his job. ?Patient has gained 15 pounds over approximately last month.  He has 1-2+ pitting bilateral lower extremity edema and JVD appreciable to angle of the mandible. ?Encourage patient to take his medications as prescribed.  Stressed the importance of adherence to his Lasix.  Given 15 pound weight gain would like to see the patient back in the office for heart failure follow-up in 1 week. ?

## 2021-06-24 NOTE — Assessment & Plan Note (Signed)
Patient continues to smoke.  He smokes about 1/2 pack/day.  He is determined to quit.  He has tried Chantix in the past but reports intolerable mood swings.  He also has used nicotine gum but reports that he hiccups uncontrollably with the gum and does not wish to use this at that time.  Additionally he has used nicotine patches which he has noticed some benefit from. ?Patient would greatly benefit from smoking cessation, as this is likely also impacting his overall breathing status and contributing to his frequent COPD exacerbations.  Furthermore suspicious lung nodules were noted on chest imaging which will need following up. ?Patient is without insurance currently and cannot afford the nicotine lozenges or nasal spray.  We will continue patches in the meantime. ?

## 2021-06-25 NOTE — Progress Notes (Signed)
Internal Medicine Clinic Attending ? ?Case discussed with Dr. Elliot Gurney  At the time of the visit.  We reviewed the resident?s history and exam and pertinent patient test results.  I agree with the assessment, diagnosis, and plan of care documented in the resident?s note.  ? ? ?Lots going on with Francisco [name] today ?He has a mild COPD exacerbation - will treat with Pred x5d ?He has also gained 15 pounds over last month in the setting of not taking his lasix. Resident talked to him extensively about this, patient will restart lasix, and then he'll come back in 1 week to reassess. ?He also has hypercalcemia & lung nodules, so needs labs & CT when he can afford it.  ?

## 2021-06-26 ENCOUNTER — Other Ambulatory Visit (HOSPITAL_COMMUNITY): Payer: Self-pay

## 2021-07-01 ENCOUNTER — Other Ambulatory Visit: Payer: Self-pay

## 2021-07-01 ENCOUNTER — Encounter: Payer: Self-pay | Admitting: Internal Medicine

## 2021-07-01 ENCOUNTER — Other Ambulatory Visit (HOSPITAL_COMMUNITY): Payer: Self-pay

## 2021-07-01 ENCOUNTER — Ambulatory Visit (INDEPENDENT_AMBULATORY_CARE_PROVIDER_SITE_OTHER): Payer: Self-pay | Admitting: Internal Medicine

## 2021-07-01 VITALS — BP 156/108 | HR 103 | Temp 97.9°F | Ht 72.0 in | Wt 198.1 lb

## 2021-07-01 DIAGNOSIS — R0602 Shortness of breath: Secondary | ICD-10-CM

## 2021-07-01 DIAGNOSIS — Z5989 Other problems related to housing and economic circumstances: Secondary | ICD-10-CM

## 2021-07-01 DIAGNOSIS — I110 Hypertensive heart disease with heart failure: Secondary | ICD-10-CM

## 2021-07-01 DIAGNOSIS — I5043 Acute on chronic combined systolic (congestive) and diastolic (congestive) heart failure: Secondary | ICD-10-CM

## 2021-07-01 DIAGNOSIS — J449 Chronic obstructive pulmonary disease, unspecified: Secondary | ICD-10-CM

## 2021-07-01 MED ORDER — LISINOPRIL 20 MG PO TABS
20.0000 mg | ORAL_TABLET | Freq: Every day | ORAL | 2 refills | Status: DC
  Filled 2021-07-01: qty 30, 30d supply, fill #0

## 2021-07-01 MED ORDER — FLUTICASONE-SALMETEROL 250-50 MCG/ACT IN AEPB
1.0000 | INHALATION_SPRAY | Freq: Two times a day (BID) | RESPIRATORY_TRACT | 2 refills | Status: DC
Start: 2021-07-01 — End: 2021-07-01
  Filled 2021-07-01: qty 60, 30d supply, fill #0

## 2021-07-01 MED ORDER — FLUTICASONE-SALMETEROL 250-50 MCG/ACT IN AEPB
1.0000 | INHALATION_SPRAY | Freq: Two times a day (BID) | RESPIRATORY_TRACT | 2 refills | Status: DC
Start: 2021-07-01 — End: 2021-10-26
  Filled 2021-07-01 – 2021-07-23 (×2): qty 60, 30d supply, fill #0
  Filled 2021-08-24: qty 60, 30d supply, fill #1
  Filled 2021-09-23: qty 60, 30d supply, fill #2

## 2021-07-01 MED ORDER — LISINOPRIL 20 MG PO TABS
20.0000 mg | ORAL_TABLET | Freq: Every day | ORAL | 2 refills | Status: DC
  Filled 2021-07-01 – 2021-07-23 (×2): qty 30, 30d supply, fill #0
  Filled 2021-08-24: qty 30, 30d supply, fill #1

## 2021-07-01 NOTE — Patient Instructions (Signed)
Dear [name], ? ?Thank you for trusting [place] with your care today. ? ?I am glad you are doing better. ? ?For your heart failure, please continue to take the Lasix. Consider taking it just before getting off work so that you are not urinating all night.  ? ?Your blood pressure was a little high, we will increase your lisinopril to 20mg  daily and have you come back in 1 month for a blood pressure follow up and blood work. ? ?We have also increased the dose of your advair diskus to help with your wheezing.  ? ? ?

## 2021-07-01 NOTE — Progress Notes (Signed)
? ?  CC: shortness of breath follow up ? ?HPI:Francisco [name] is a 61 y.o. male who presents for evaluation of shortness of breath.. Please see individual problem based A/P for details. ? ? ?Depression, PHQ-9: ?Based on the patients  ?[place] Office Visit from 07/01/2021 in [facility]  ?PHQ-9 Total Score 7  ? ?  ? score we have 7. ? ?Past Medical History:  ?Diagnosis Date  ? CHF (congestive heart failure) ([place])   ? COPD (chronic obstructive pulmonary disease) ([place])   ? Coronary artery disease   ? a. s/p prior PCI.  ? Hepatitis-C   ? Hypertension   ? IV drug abuse ([place])   ? a. 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  ? Medically noncompliant   ? MI (myocardial infarction) ([place])   ? Tobacco abuse   ? ?Review of Systems:   ?Review of Systems  ?Constitutional: Negative.   ?Respiratory:  Positive for wheezing.   ?Cardiovascular: Negative.   ?Gastrointestinal: Negative.   ?Genitourinary:  Positive for frequency.  ?Skin: Negative.   ?Neurological: Negative.    ? ?Physical Exam: ?Vitals:  ? 07/01/21 1113 07/01/21 1121  ?BP: (!) 162/105 (!) 156/108  ?Pulse: 95 (!) 103  ?Temp: 97.9 ?F (36.6 ?C)   ?TempSrc: Oral   ?SpO2: 95%   ?Weight: 198 lb 1.6 oz (89.9 kg)   ?Height: 6' (1.829 m)   ? ? ? ?General: alert and oriented, no acute distress ?HEENT: Conjunctiva nl , antiicteric sclerae, moist mucous membranes, no exudate or erythema ?Cardiovascular: Normal rate, regular rhythm.  No murmurs, rubs, or gallops ?Pulmonary : mild end expiratory wheeze ?Abdominal: soft, nontender,  bowel sounds present ?Ext: trace bilateral lower extremity edema improving. ? ?Assessment & Plan:  ? ?See Encounters Tab for problem based charting. ? ?Patient discussed with Dr.  Cain Sieve ? ?

## 2021-07-02 ENCOUNTER — Encounter: Payer: Self-pay | Admitting: Internal Medicine

## 2021-07-02 ENCOUNTER — Telehealth: Payer: Self-pay | Admitting: *Deleted

## 2021-07-02 NOTE — Assessment & Plan Note (Signed)
>>  ASSESSMENT AND PLAN FOR COPD (CHRONIC OBSTRUCTIVE PULMONARY DISEASE) (HCC) WRITTEN ON 07/02/2021 11:45 PM BY GRIFFITH PORTER, MD  Patient presents for follow up on his breathing. Was treated with prednisone  for presumed mild COPD exacerbation. He reports that prednisone  has helped. He is breathing much better, but continues to wheeze. States he is not producing any more sputum.  Patient continues to have diffuse end expiratory wheeze on exam. We will increase his advair  from 100-50 to 250-50.  Continue Spiriva  and albuterol .

## 2021-07-02 NOTE — Assessment & Plan Note (Signed)
Patient presents for follow up on his breathing. Was treated with prednisone for presumed mild COPD exacerbation. He reports that prednisone has helped. He is breathing much better, but continues to wheeze. States he is not producing any more sputum.  ?Patient continues to have diffuse end expiratory wheeze on exam. ?We will increase his advair from 100-50 to 250-50.  ?Continue Spiriva and albuterol. ?

## 2021-07-02 NOTE — Chronic Care Management (AMB) (Signed)
?  Care Management  ? ?Outreach Note ? ?07/02/2021 ?Name: Isauro Morrill MRN: [id number] DOB: 1960-07-21 ? ?Referred by: Delene Ruffini, MD ?Reason for referral : Care Coordination (Initial outreach to schedule referral with BSW) ? ? ?An unsuccessful telephone outreach was attempted today. The patient was referred to the case management team for assistance with care management and care coordination.  ? ?Follow Up Plan:  ?A HIPAA compliant phone message was left for the patient providing contact information and requesting a return call.  ?The care management team will reach out to the patient again over the next 7 days.  ?If patient returns call to provider office, please advise to call [place]* at [phone].* ? ?Laverda Sorenson  ?Care Guide, Embedded Care Coordination ?[place]  Care Management  ?Direct Dial: [phone] ? ?

## 2021-07-02 NOTE — Assessment & Plan Note (Signed)
Blood pressure elevated today. He is asymptomatic.  ?He currently takes lisinopril 10mg . ?We will increase lisinopril to 20mg . Plan to check BP and bmp 1 month. ?

## 2021-07-02 NOTE — Assessment & Plan Note (Signed)
Patient reports he is feeling better after taking lasix. He is able to comfortably sleep flat and is able to stand for duration of showers without needing to sit. He does report that he has been urinating throughout the night. Advised patient to take earlier in the day, just before finishing work for the day so that he does not need to wake up as frequently.  ?His weight has improved by 5lbs. He does still have some trace edema in legs bilaterally. ?Overall, CHF has improved. We will continue his lasix 40mg  daily. BP elevated for which we will increase his lisinopril to 20mg .  ?Continue toprol 25.  ?

## 2021-07-02 NOTE — Assessment & Plan Note (Addendum)
Patient was given orange card paperwork at last visit. He reports he has started to fill it out, but having difficulty.  ?CCM referral placed for further assistance.  ?

## 2021-07-03 NOTE — Progress Notes (Signed)
Internal Medicine Clinic Attending ° °Case discussed with Dr. Gawaluck  At the time of the visit.  We reviewed the resident’s history and exam and pertinent patient test results.  I agree with the assessment, diagnosis, and plan of care documented in the resident’s note.  °

## 2021-07-06 ENCOUNTER — Other Ambulatory Visit (HOSPITAL_COMMUNITY): Payer: Self-pay

## 2021-07-09 NOTE — Chronic Care Management (AMB) (Signed)
?  Care Management  ? ?Outreach Note ? ?07/09/2021 ?Name: Francisco [name] MRN: [id number] DOB: January 20, 1961 ? ?Referred by: Adron Bene, MD ?Reason for referral : Care Coordination (Initial outreach to schedule referral with BSW) ? ? ?A second unsuccessful telephone outreach was attempted today. The patient was referred to the case management team for assistance with care management and care coordination.  ? ?Follow Up Plan:  ?A HIPAA compliant phone message was left for the patient providing contact information and requesting a return call.  ?The care management team will reach out to the patient again over the next 7 days.  ?If patient returns call to provider office, please advise to call Embedded Care Management Care Guide Francisco [name] * at [phone].* ? ?Gwenevere Ghazi  ?Care Guide, Embedded Care Coordination ?[place]  Care Management  ?Direct Dial: [phone] ? ?

## 2021-07-14 NOTE — Chronic Care Management (AMB) (Signed)
?  Care Management  ? ?Outreach Note ? ?07/14/2021 ?Name: Francisco [name] MRN: [id number] DOB: 07/14/1960 ? ?Referred by: Delene Ruffini, MD ?Reason for referral : Care Coordination (Initial outreach to schedule referral with BSW) ? ? ?An unsuccessful telephone outreach was attempted today. The patient was referred to the case management team for assistance with care management and care coordination.  ? ?Follow Up Plan:  ?The care management team will reach out to the patient again over the next 7 days.  ?If patient returns call to provider office, please advise to call [facility]* at [phone].* ? ?Laverda Sorenson  ?Care Guide, Embedded Care Coordination ?[place]  Care Management  ?Direct Dial: [phone] ? ?

## 2021-07-17 NOTE — Chronic Care Management (AMB) (Signed)
?  Care Management  ? ?Outreach Note ? ?07/17/2021 ?Name: Francisco [name] MRN: [id number] DOB: [birth year] ? ?Referred by: Adron Bene, MD ?Reason for referral : Care Coordination (Initial outreach to schedule referral with BSW) ? ? ?Third unsuccessful telephone outreach was attempted today. The patient was referred to the case management team for assistance with care management and care coordination. The patient's primary care provider has been notified of our unsuccessful attempts to make or maintain contact with the patient. The care management team is pleased to engage with this patient at any time in the future should he/she be interested in assistance from the care management team.  ? ?Follow Up Plan:  ?We have been unable to make contact with the patient for follow up. The care management team is available to follow up with the patient after provider conversation with the patient regarding recommendation for care management engagement and subsequent re-referral to the care management team. A HIPAA compliant phone message was left for the patient providing contact information and requesting a return call.  ? ?Gwenevere Ghazi  ?Care Guide, Embedded Care Coordination ?[place]  Care Management  ?Direct Dial: [phone] ? ?

## 2021-07-22 ENCOUNTER — Other Ambulatory Visit (HOSPITAL_COMMUNITY): Payer: Self-pay

## 2021-07-22 ENCOUNTER — Other Ambulatory Visit: Payer: Self-pay | Admitting: Internal Medicine

## 2021-07-23 ENCOUNTER — Other Ambulatory Visit (HOSPITAL_COMMUNITY): Payer: Self-pay

## 2021-07-24 ENCOUNTER — Other Ambulatory Visit (HOSPITAL_COMMUNITY): Payer: Self-pay

## 2021-07-28 ENCOUNTER — Other Ambulatory Visit (HOSPITAL_COMMUNITY): Payer: Self-pay

## 2021-08-06 ENCOUNTER — Other Ambulatory Visit (HOSPITAL_COMMUNITY): Payer: Self-pay

## 2021-08-08 ENCOUNTER — Emergency Department (HOSPITAL_COMMUNITY): Payer: Self-pay

## 2021-08-08 ENCOUNTER — Emergency Department (HOSPITAL_COMMUNITY)
Admission: EM | Admit: 2021-08-08 | Discharge: 2021-08-09 | Disposition: A | Discharge status: Home / Self Care | Payer: Self-pay | Attending: Emergency Medicine | Admitting: Emergency Medicine

## 2021-08-08 ENCOUNTER — Encounter (HOSPITAL_COMMUNITY): Payer: Self-pay

## 2021-08-08 DIAGNOSIS — F17210 Nicotine dependence, cigarettes, uncomplicated: Secondary | ICD-10-CM | POA: Insufficient documentation

## 2021-08-08 DIAGNOSIS — L03113 Cellulitis of right upper limb: Secondary | ICD-10-CM | POA: Insufficient documentation

## 2021-08-08 DIAGNOSIS — J441 Chronic obstructive pulmonary disease with (acute) exacerbation: Secondary | ICD-10-CM | POA: Insufficient documentation

## 2021-08-08 DIAGNOSIS — I2510 Atherosclerotic heart disease of native coronary artery without angina pectoris: Secondary | ICD-10-CM | POA: Insufficient documentation

## 2021-08-08 DIAGNOSIS — I509 Heart failure, unspecified: Secondary | ICD-10-CM | POA: Insufficient documentation

## 2021-08-08 DIAGNOSIS — I110 Hypertensive heart disease with heart failure: Secondary | ICD-10-CM | POA: Insufficient documentation

## 2021-08-08 DIAGNOSIS — L0390 Cellulitis, unspecified: Secondary | ICD-10-CM

## 2021-08-08 LAB — CBC
HCT: 44.4 % (ref 39.0–52.0)
Hemoglobin: 14.7 g/dL (ref 13.0–17.0)
MCH: 31.9 pg (ref 26.0–34.0)
MCHC: 33.1 g/dL (ref 30.0–36.0)
MCV: 96.3 fL (ref 80.0–100.0)
Platelets: 235 10*3/uL (ref 150–400)
RBC: 4.61 MIL/uL (ref 4.22–5.81)
RDW: 14.1 % (ref 11.5–15.5)
WBC: 14.3 10*3/uL — ABNORMAL HIGH (ref 4.0–10.5)
nRBC: 0 % (ref 0.0–0.2)

## 2021-08-08 LAB — BASIC METABOLIC PANEL
Anion gap: 8 (ref 5–15)
BUN: 8 mg/dL (ref 6–20)
CO2: 28 mmol/L (ref 22–32)
Calcium: 9.7 mg/dL (ref 8.9–10.3)
Chloride: 102 mmol/L (ref 98–111)
Creatinine, Ser: 1.08 mg/dL (ref 0.61–1.24)
GFR, Estimated: >60 mL/min (ref >60)
Glucose, Bld: 114 mg/dL — ABNORMAL HIGH (ref 70–99)
Potassium: 3.7 mmol/L (ref 3.5–5.1)
Sodium: 138 mmol/L (ref 135–145)

## 2021-08-08 LAB — TROPONIN I (HIGH SENSITIVITY)
Troponin I (High Sensitivity): 37 ng/L — ABNORMAL HIGH (ref <18)
Troponin I (High Sensitivity): 41 ng/L — ABNORMAL HIGH (ref <18)

## 2021-08-08 MED ORDER — DALBAVANCIN HCL 500 MG IV SOLR
1500.0000 mg | Freq: Once | INTRAVENOUS | Status: AC
  Administered 2021-08-09: 1500 mg via INTRAVENOUS
  Filled 2021-08-08: qty 75

## 2021-08-08 MED ORDER — IPRATROPIUM-ALBUTEROL 0.5-2.5 (3) MG/3ML IN SOLN
3.0000 mL | Freq: Once | RESPIRATORY_TRACT | Status: AC
Start: 2021-08-08 — End: 2021-08-09
  Administered 2021-08-09: 3 mL via RESPIRATORY_TRACT
  Filled 2021-08-08: qty 3

## 2021-08-08 MED ORDER — METHYLPREDNISOLONE SODIUM SUCC 125 MG IJ SOLR
125.0000 mg | Freq: Once | INTRAMUSCULAR | Status: AC
  Administered 2021-08-09: 125 mg via INTRAVENOUS
  Filled 2021-08-08: qty 2

## 2021-08-08 NOTE — ED Provider Notes (Signed)
?[place] DEPT ?[facility] Emergency Department ?Provider Note ?MRN:  [id number]  ?Arrival date & time: 08/09/21    ? ?Chief Complaint   ?Shortness of Breath ?  ?History of Present Illness   ?Francisco [name] is a 61 y.o. year-old male with a history of CHF, COPD, CAD presenting to the ED with chief complaint of shortness of breath. ? ?Gradual onset shortness of breath over the past 3 days.  Thinks he is having exacerbation of his CHF or COPD.  No fever, no cough, no chest pain.  No abdominal pain.  Also has a new rash to his right wrist.  Explains that he relapsed and injected methamphetamine into his skin. ? ?Review of Systems  ?A thorough review of systems was obtained and all systems are negative except as noted in the HPI and PMH.  ? ?[name] Health History   ? ?Past Medical History:  ?Diagnosis Date  ? CHF (congestive heart failure) ([place])   ? COPD (chronic obstructive pulmonary disease) ([place])   ? Coronary artery disease   ? a. s/p prior PCI.  ? Hepatitis-C   ? Hypertension   ? IV drug abuse ([place])   ? a. 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  ? Medically noncompliant   ? MI (myocardial infarction) ([place])   ? Tobacco abuse   ?  ?Past Surgical History:  ?Procedure Laterality Date  ? BACK SURGERY    ? NECK SURGERY    ? RIGHT/LEFT HEART CATH AND CORONARY ANGIOGRAPHY N/A 04/30/2019  ? Procedure: RIGHT/LEFT HEART CATH AND CORONARY ANGIOGRAPHY;  Surgeon: Leonie Man, MD;  Location: [facility] CV LAB;  Service: Cardiovascular;  Laterality: N/A;  ? stints    ?  ?Family History  ?Problem Relation Age of Onset  ? Rheum arthritis Mother   ? Heart disease Paternal Grandfather   ?  ?Social History  ? ?Socioeconomic History  ? Marital status: Divorced  ?  Spouse name: Not on file  ? Number of children: Not on file  ? Years of education: Not on file  ? Highest education level: Not on file  ?Occupational History  ? Not on file  ?Tobacco Use  ? Smoking status: Every Day  ?  Packs/day: 0.50  ?  Types:  Cigarettes  ? Smokeless tobacco: Never  ?Vaping Use  ? Vaping Use: Never used  ?Substance and Sexual Activity  ? Alcohol use: Never  ? Drug use: Yes  ?  Types: Methamphetamines  ?  Comment: heroin  ? Sexual activity: Not on file  ?Other Topics Concern  ? Not on file  ?Social History Narrative  ? Not on file  ? ?Social Determinants of Health  ? ?Financial Resource Strain: Not on file  ?Food Insecurity: Not on file  ?Transportation Needs: Not on file  ?Physical Activity: Not on file  ?Stress: Not on file  ?Social Connections: Not on file  ?Intimate Partner Violence: Not on file  ?  ? ?Physical Exam  ? ?Vitals:  ? 08/09/21 0100 08/09/21 0115  ?BP: 136/82 (!) 146/102  ?Pulse: 99 97  ?Resp: 20 (!) 23  ?Temp:    ?SpO2: 93% 94%  ?  ?CONSTITUTIONAL: Chronically ill-appearing, NAD ?NEURO/PSYCH:  Alert and oriented x 3, no focal deficits ?EYES:  eyes equal and reactive ?ENT/NECK:  no LAD, no JVD ?CARDIO: Regular rate, well-perfused, normal S1 and S2 ?PULM:  CTAB no wheezing or rhonchi ?GI/GU:  non-distended, non-tender ?MSK/SPINE:  No gross deformities, no edema ?SKIN: Erythema and induration to the dorsal  aspect of the right forearm, no fluctuance ? ? ?*Additional and/or pertinent findings included in MDM below ? ?Diagnostic and Interventional Summary  ? ? EKG Interpretation ? ?Date/Time:  Saturday August 08 2021 18:35:04 EDT ?Ventricular Rate:  129 ?PR Interval:  130 ?QRS Duration: 88 ?QT Interval:  302 ?QTC Calculation: 442 ?R Axis:   88 ?Text Interpretation: Sinus tachycardia ST & T wave abnormality, consider anterolateral ischemia Abnormal ECG When compared with ECG of 12-May-2021 18:08, PREVIOUS ECG IS PRESENT Confirmed by Gerlene Fee [phone]) on 08/08/2021 11:27:45 PM ?  ? ?  ? ?Labs Reviewed  ?BASIC METABOLIC PANEL - Abnormal; Notable for the following components:  ?    Result Value  ? Glucose, Bld 114 (*)   ? All other components within normal limits  ?CBC - Abnormal; Notable for the following components:  ? WBC 14.3  (*)   ? All other components within normal limits  ?TROPONIN I (HIGH SENSITIVITY) - Abnormal; Notable for the following components:  ? Troponin I (High Sensitivity) 37 (*)   ? All other components within normal limits  ?TROPONIN I (HIGH SENSITIVITY) - Abnormal; Notable for the following components:  ? Troponin I (High Sensitivity) 41 (*)   ? All other components within normal limits  ?  ?DG Chest 2 View  ?Final Result  ?  ?  ?Medications  ?ipratropium-albuterol (DUONEB) 0.5-2.5 (3) MG/3ML nebulizer solution 3 mL (3 mLs Nebulization Given 08/09/21 0010)  ?methylPREDNISolone sodium succinate (SOLU-MEDROL) 125 mg/2 mL injection 125 mg (125 mg Intravenous Given 08/09/21 0011)  ?dalbavancin (DALVANCE) 1,500 mg in dextrose 5 % 500 mL IVPB (0 mg Intravenous Stopped 08/09/21 0130)  ?  ? ?Procedures  /  Critical Care ?Procedures ? ?ED Course and Medical Decision Making  ?Initial Impression and Ddx ?Patient with 2 major complaints this evening.  Shortness of breath favored to be due to COPD exacerbation, could be multifactorial with CHF contributing.  He is not terribly fluid overloaded on exam and chest x-ray is without pulmonary edema.  Providing Solu-Medrol, breathing treatment and will reassess.  Patient has cellulitis to the right upper extremity in the setting of injection of methamphetamine.  Has been sober for a long time but relapsed on his deceased mother's birthday.  We discussed management options and admission was suggested given the extent of the swelling.  Patient explains he has a new job and cannot miss work and needs to be discharged.  He does not have systemic symptoms of illness in the form of fever or having a toxic appearing nature.  Will provide Dalvance and reassess. ? ?Past medical/surgical history that increases complexity of ED encounter: History of COPD, CHF, drug use disorder ? ?Interpretation of Diagnostics ?I personally reviewed the Chest Xray and my interpretation is as follows: No pulmonary  edema, no pneumothorax ?   ?Labs are overall reassuring with no significant blood count or electrolyte disturbance.  Troponin is minimally elevated and largely flat on repeat, near his prior baselines. ? ?Patient Reassessment and Ultimate Disposition/Management ?Patient is adamant on discharge, does not want to miss any work.  Feeling better after breathing treatment, steroids, received Dalvance.  Strict return precautions. ? ?Patient management required discussion with the following services or consulting groups:  None ? ?Complexity of Problems Addressed ?Acute illness or injury that poses threat of life of bodily function ? ?Additional Data Reviewed and Analyzed ?Further history obtained from: ?None ? ?Additional Factors Impacting ED Encounter Risk ?Consideration of hospitalization ? ?Barth Kirks. Sedonia Small, MD ?[facility] ?[place] ?[email or web address] ? ?Final Clinical Impressions(s) / ED Diagnoses  ? ?  ICD-10-CM   ?1. Cellulitis  L03.90 Ambulatory referral to Infectious Disease  ?  ?2. COPD exacerbation ([place])  J44.1   ?  ?3. Cellulitis of right upper extremity  L03.113   ?  ?  ?ED Discharge Orders   ? ?      Ordered  ?  predniSONE (DELTASONE) 20 MG tablet  Daily       ? 08/09/21 0131  ?  Ambulatory referral to Infectious Disease       ?Comments: Cellulitis patient:  Received dalbavancin on 08/08/2021.  ? 08/08/21 2344  ? ?  ?  ? ?  ?  ? ?Discharge Instructions Discussed with and Provided to Patient:  ? ? ? ?Discharge Instructions   ? ?  ?You were evaluated in the Emergency Department and after careful evaluation, we did not find any emergent condition requiring admission or further testing in the hospital. ? ?Your exam/testing today is overall reassuring.  Recommend close follow-up with your primary care doctor to discuss your symptoms.  Take prednisone as directed. ? ?Please return to the Emergency Department if you experience any worsening of your condition.   Thank you for  allowing [place] to be a part of your care. ? ? ? ? ?  ?Maudie Flakes, MD ?08/09/21 0133 ? ?

## 2021-08-08 NOTE — ED Triage Notes (Signed)
Pt reports cp,sob x2 days. Pt reports drug use for the 1st time in a long time yesterday. Pt reports using heroin and now is c/o left arm pain . Pt arm is swollen & red. Pt reports h/o copd.  ?

## 2021-08-09 MED ORDER — PREDNISONE 20 MG PO TABS
40.0000 mg | ORAL_TABLET | Freq: Every day | ORAL | 0 refills | Status: DC
  Filled 2021-08-09: qty 8, 4d supply, fill #0

## 2021-08-09 NOTE — Discharge Instructions (Signed)
You were evaluated in the Emergency Department and after careful evaluation, we did not find any emergent condition requiring admission or further testing in the hospital. ? ?Your exam/testing today is overall reassuring.  Recommend close follow-up with your primary care doctor to discuss your symptoms.  Take prednisone as directed. ? ?Please return to the Emergency Department if you experience any worsening of your condition.   Thank you for allowing [place] to be a part of your care. ?

## 2021-08-10 ENCOUNTER — Other Ambulatory Visit (HOSPITAL_COMMUNITY): Payer: Self-pay

## 2021-08-11 ENCOUNTER — Other Ambulatory Visit (HOSPITAL_COMMUNITY): Payer: Self-pay

## 2021-08-12 ENCOUNTER — Observation Stay (HOSPITAL_COMMUNITY)
Admission: EM | Admit: 2021-08-12 | Discharge: 2021-08-13 | Disposition: A | Discharge status: Home / Self Care | Payer: Self-pay | Attending: Student in an Organized Health Care Education/Training Program | Admitting: Student in an Organized Health Care Education/Training Program

## 2021-08-12 ENCOUNTER — Emergency Department (HOSPITAL_COMMUNITY): Payer: Self-pay

## 2021-08-12 ENCOUNTER — Other Ambulatory Visit: Payer: Self-pay

## 2021-08-12 ENCOUNTER — Encounter (HOSPITAL_COMMUNITY): Payer: Self-pay

## 2021-08-12 DIAGNOSIS — I5040 Unspecified combined systolic (congestive) and diastolic (congestive) heart failure: Secondary | ICD-10-CM | POA: Diagnosis present

## 2021-08-12 DIAGNOSIS — L03113 Cellulitis of right upper limb: Secondary | ICD-10-CM | POA: Diagnosis present

## 2021-08-12 DIAGNOSIS — Z7982 Long term (current) use of aspirin: Secondary | ICD-10-CM | POA: Insufficient documentation

## 2021-08-12 DIAGNOSIS — J449 Chronic obstructive pulmonary disease, unspecified: Secondary | ICD-10-CM | POA: Diagnosis present

## 2021-08-12 DIAGNOSIS — I5042 Chronic combined systolic (congestive) and diastolic (congestive) heart failure: Principal | ICD-10-CM | POA: Insufficient documentation

## 2021-08-12 DIAGNOSIS — I2510 Atherosclerotic heart disease of native coronary artery without angina pectoris: Secondary | ICD-10-CM | POA: Insufficient documentation

## 2021-08-12 DIAGNOSIS — I509 Heart failure, unspecified: Secondary | ICD-10-CM

## 2021-08-12 DIAGNOSIS — I110 Hypertensive heart disease with heart failure: Secondary | ICD-10-CM | POA: Insufficient documentation

## 2021-08-12 DIAGNOSIS — J441 Chronic obstructive pulmonary disease with (acute) exacerbation: Secondary | ICD-10-CM | POA: Diagnosis present

## 2021-08-12 DIAGNOSIS — Z20822 Contact with and (suspected) exposure to covid-19: Secondary | ICD-10-CM | POA: Insufficient documentation

## 2021-08-12 DIAGNOSIS — R911 Solitary pulmonary nodule: Secondary | ICD-10-CM | POA: Insufficient documentation

## 2021-08-12 DIAGNOSIS — F1510 Other stimulant abuse, uncomplicated: Secondary | ICD-10-CM | POA: Diagnosis present

## 2021-08-12 DIAGNOSIS — B182 Chronic viral hepatitis C: Secondary | ICD-10-CM | POA: Insufficient documentation

## 2021-08-12 DIAGNOSIS — Z79899 Other long term (current) drug therapy: Secondary | ICD-10-CM | POA: Insufficient documentation

## 2021-08-12 DIAGNOSIS — I5033 Acute on chronic diastolic (congestive) heart failure: Secondary | ICD-10-CM | POA: Diagnosis present

## 2021-08-12 LAB — BRAIN NATRIURETIC PEPTIDE: B Natriuretic Peptide: 980.1 pg/mL — ABNORMAL HIGH (ref 0.0–100.0)

## 2021-08-12 LAB — COMPREHENSIVE METABOLIC PANEL
ALT: 71 U/L — ABNORMAL HIGH (ref 0–44)
AST: 51 U/L — ABNORMAL HIGH (ref 15–41)
Albumin: 3.4 g/dL — ABNORMAL LOW (ref 3.5–5.0)
Alkaline Phosphatase: 68 U/L (ref 38–126)
Anion gap: 8 (ref 5–15)
BUN: 13 mg/dL (ref 6–20)
CO2: 24 mmol/L (ref 22–32)
Calcium: 8.7 mg/dL — ABNORMAL LOW (ref 8.9–10.3)
Chloride: 107 mmol/L (ref 98–111)
Creatinine, Ser: 1.08 mg/dL (ref 0.61–1.24)
GFR, Estimated: >60 mL/min (ref >60)
Glucose, Bld: 144 mg/dL — ABNORMAL HIGH (ref 70–99)
Potassium: 3.9 mmol/L (ref 3.5–5.1)
Sodium: 139 mmol/L (ref 135–145)
Total Bilirubin: 0.4 mg/dL (ref 0.3–1.2)
Total Protein: 6.4 g/dL — ABNORMAL LOW (ref 6.5–8.1)

## 2021-08-12 LAB — CBC WITH DIFFERENTIAL/PLATELET
Abs Immature Granulocytes: 0.06 10*3/uL (ref 0.00–0.07)
Basophils Absolute: 0.1 10*3/uL (ref 0.0–0.1)
Basophils Relative: 1 %
Eosinophils Absolute: 0.1 10*3/uL (ref 0.0–0.5)
Eosinophils Relative: 1 %
HCT: 42.3 % (ref 39.0–52.0)
Hemoglobin: 13.8 g/dL (ref 13.0–17.0)
Immature Granulocytes: 1 %
Lymphocytes Relative: 23 %
Lymphs Abs: 2.4 10*3/uL (ref 0.7–4.0)
MCH: 31.9 pg (ref 26.0–34.0)
MCHC: 32.6 g/dL (ref 30.0–36.0)
MCV: 97.7 fL (ref 80.0–100.0)
Monocytes Absolute: 1.2 10*3/uL — ABNORMAL HIGH (ref 0.1–1.0)
Monocytes Relative: 11 %
Neutro Abs: 6.8 10*3/uL (ref 1.7–7.7)
Neutrophils Relative %: 63 %
Platelets: 237 10*3/uL (ref 150–400)
RBC: 4.33 MIL/uL (ref 4.22–5.81)
RDW: 14.2 % (ref 11.5–15.5)
WBC: 10.5 10*3/uL (ref 4.0–10.5)
nRBC: 0 % (ref 0.0–0.2)

## 2021-08-12 LAB — TROPONIN I (HIGH SENSITIVITY): Troponin I (High Sensitivity): 34 ng/L — ABNORMAL HIGH (ref <18)

## 2021-08-12 NOTE — ED Notes (Addendum)
PT stuck multiple times in triage for cultures and lactic with no success. Provider aware ?

## 2021-08-12 NOTE — ED Provider Triage Note (Signed)
Emergency Medicine Provider Triage Evaluation Note ? ?Francisco [name] , a 61 y.o. male  was evaluated in triage.  Pt complains of shortness of breath, chest pain, and wound to right wrist. ? ?Reports that he has had shortness of breath for the last for 5 days.  Shortness of breath has been getting progressively worse.  Patient endorses cough that is producing yellow mucus.  Patient states that he does not normally produce mucus with his cough.  Patient has been using nebulizer at home with improvement in his shortness of breath.  Patient was unable to pick up his prednisone medication.  reports that he has had "a little bit of chest pain," over the last few days.   ? ?Patient reports that on Friday he injected his right wrist with methamphetamine.  Wound developed after that.  Reports that wound spontaneously opened approximately 30 minutes ago and produced purulent discharge.  Patient was prescribed Dalvance but was unable to pick up this medication. ? ?Review of Systems  ?Positive: Shortness of breath, chest pain, leg swelling, wounds, erythema ?Negative: Fever, chills, lightheadedness, syncope, hemoptysis, nausea, vomiting, diaphoresis ? ?Physical Exam  ?BP (!) 183/106 (BP Location: Left Arm)   Pulse (!) 115   Temp 98.6 ?F (37 ?C) (Oral)   Resp (!) 22   Ht 6' (1.829 m)   Wt 90.7 kg   SpO2 94%   BMI 27.12 kg/m?  ?Gen:   Awake, no distress   ?Resp:  Increased effort of breathing, no wheezing or rhonchi noted.  Decreased lung sounds throughout. ?MSK:   Moves extremities without difficulty, minimal edema to bilateral lower extremity ?Other:  Patient has wound to right forearm with surrounding erythema and warmth. ? ?Medical Decision Making  ?Medically screening exam initiated at 7:01 PM.  Appropriate orders placed.  Francisco [name] was informed that the remainder of the evaluation will be completed by another provider, this initial triage assessment does not replace that evaluation, and the importance of remaining in  the ED until their evaluation is complete. ? ?Concern for worsening cellulitis as well as COPD exacerbation. ?  ?Haskel Schroeder, PA-C ?08/12/21 1904 ? ?

## 2021-08-12 NOTE — ED Triage Notes (Signed)
Reports sob and difficulty breathing, congested cough noted, Has abscess to right wrist from shooting up meth.  Being treated with antibiotics.  Last use of meth this past weekend  ?

## 2021-08-13 ENCOUNTER — Other Ambulatory Visit (HOSPITAL_COMMUNITY): Payer: Self-pay

## 2021-08-13 ENCOUNTER — Observation Stay (HOSPITAL_COMMUNITY): Payer: Self-pay

## 2021-08-13 ENCOUNTER — Observation Stay (HOSPITAL_BASED_OUTPATIENT_CLINIC_OR_DEPARTMENT_OTHER): Payer: Self-pay

## 2021-08-13 DIAGNOSIS — L03113 Cellulitis of right upper limb: Secondary | ICD-10-CM | POA: Diagnosis present

## 2021-08-13 DIAGNOSIS — R0609 Other forms of dyspnea: Secondary | ICD-10-CM

## 2021-08-13 DIAGNOSIS — I5040 Unspecified combined systolic (congestive) and diastolic (congestive) heart failure: Secondary | ICD-10-CM | POA: Diagnosis present

## 2021-08-13 DIAGNOSIS — I5033 Acute on chronic diastolic (congestive) heart failure: Secondary | ICD-10-CM

## 2021-08-13 LAB — BASIC METABOLIC PANEL
Anion gap: 8 (ref 5–15)
BUN: 10 mg/dL (ref 6–20)
CO2: 28 mmol/L (ref 22–32)
Calcium: 9 mg/dL (ref 8.9–10.3)
Chloride: 103 mmol/L (ref 98–111)
Creatinine, Ser: 0.92 mg/dL (ref 0.61–1.24)
GFR, Estimated: >60 mL/min (ref >60)
Glucose, Bld: 84 mg/dL (ref 70–99)
Potassium: 3.5 mmol/L (ref 3.5–5.1)
Sodium: 139 mmol/L (ref 135–145)

## 2021-08-13 LAB — LACTIC ACID, PLASMA: Lactic Acid, Venous: 0.9 mmol/L (ref 0.5–1.9)

## 2021-08-13 LAB — RESP PANEL BY RT-PCR (FLU A&B, COVID) ARPGX2
Influenza A by PCR: NEGATIVE
Influenza B by PCR: NEGATIVE
SARS Coronavirus 2 by RT PCR: NEGATIVE

## 2021-08-13 LAB — URINALYSIS, ROUTINE W REFLEX MICROSCOPIC
Bilirubin Urine: NEGATIVE
Glucose, UA: NEGATIVE mg/dL
Hgb urine dipstick: NEGATIVE
Ketones, ur: NEGATIVE mg/dL
Leukocytes,Ua: NEGATIVE
Nitrite: NEGATIVE
Protein, ur: NEGATIVE mg/dL
Specific Gravity, Urine: 1.012 (ref 1.005–1.030)
pH: 6 (ref 5.0–8.0)

## 2021-08-13 LAB — ECHOCARDIOGRAM COMPLETE
Height: 72 in
S' Lateral: 3.8 cm
Weight: 3200 oz

## 2021-08-13 LAB — MAGNESIUM: Magnesium: 2 mg/dL (ref 1.7–2.4)

## 2021-08-13 LAB — TROPONIN I (HIGH SENSITIVITY): Troponin I (High Sensitivity): 35 ng/L — ABNORMAL HIGH (ref <18)

## 2021-08-13 LAB — RAPID URINE DRUG SCREEN, HOSP PERFORMED
Amphetamines: POSITIVE — AB
Barbiturates: NOT DETECTED
Benzodiazepines: NOT DETECTED
Cocaine: NOT DETECTED
Opiates: NOT DETECTED
Tetrahydrocannabinol: NOT DETECTED

## 2021-08-13 LAB — C-REACTIVE PROTEIN: CRP: 2.8 mg/dL — ABNORMAL HIGH (ref <1.0)

## 2021-08-13 LAB — SEDIMENTATION RATE: Sed Rate: 5 mm/hr (ref 0–16)

## 2021-08-13 LAB — TSH: TSH: 1.013 u[IU]/mL (ref 0.350–4.500)

## 2021-08-13 MED ORDER — ONDANSETRON HCL 4 MG/2ML IJ SOLN: 4.0000 mg | Freq: Four times a day (QID) | INTRAMUSCULAR | Status: DC | PRN

## 2021-08-13 MED ORDER — SULFAMETHOXAZOLE-TRIMETHOPRIM 800-160 MG PO TABS
1.0000 | ORAL_TABLET | Freq: Two times a day (BID) | ORAL | Status: DC
  Administered 2021-08-13: 1 via ORAL
  Filled 2021-08-13: qty 1

## 2021-08-13 MED ORDER — ALBUTEROL SULFATE (2.5 MG/3ML) 0.083% IN NEBU
2.5000 mg | INHALATION_SOLUTION | Freq: Four times a day (QID) | RESPIRATORY_TRACT | Status: DC | PRN
  Administered 2021-08-13: 2.5 mg via RESPIRATORY_TRACT
  Filled 2021-08-13: qty 3

## 2021-08-13 MED ORDER — FUROSEMIDE 10 MG/ML IJ SOLN: 40.0000 mg | Freq: Every day | INTRAMUSCULAR | Status: DC

## 2021-08-13 MED ORDER — LISINOPRIL 20 MG PO TABS
20.0000 mg | ORAL_TABLET | Freq: Every day | ORAL | Status: DC
Start: 2021-08-13 — End: 2021-08-13
  Administered 2021-08-13: 20 mg via ORAL
  Filled 2021-08-13: qty 1

## 2021-08-13 MED ORDER — ENOXAPARIN SODIUM 40 MG/0.4ML IJ SOSY: 40.0000 mg | PREFILLED_SYRINGE | INTRAMUSCULAR | Status: DC

## 2021-08-13 MED ORDER — PREDNISONE 20 MG PO TABS
40.0000 mg | ORAL_TABLET | Freq: Every day | ORAL | Status: DC
  Administered 2021-08-13: 40 mg via ORAL
  Filled 2021-08-13 (×2): qty 2

## 2021-08-13 MED ORDER — FUROSEMIDE 20 MG PO TABS
80.0000 mg | ORAL_TABLET | Freq: Every day | ORAL | Status: DC
  Administered 2021-08-13: 80 mg via ORAL
  Filled 2021-08-13: qty 4

## 2021-08-13 MED ORDER — POTASSIUM CHLORIDE CRYS ER 20 MEQ PO TBCR
30.0000 meq | EXTENDED_RELEASE_TABLET | Freq: Two times a day (BID) | ORAL | Status: DC
  Administered 2021-08-13: 30 meq via ORAL
  Filled 2021-08-13: qty 1

## 2021-08-13 MED ORDER — FLUTICASONE FUROATE-VILANTEROL 200-25 MCG/ACT IN AEPB
1.0000 | INHALATION_SPRAY | Freq: Every day | RESPIRATORY_TRACT | Status: DC
  Administered 2021-08-13: 1 via RESPIRATORY_TRACT
  Filled 2021-08-13: qty 28

## 2021-08-13 MED ORDER — VANCOMYCIN HCL 1250 MG/250ML IV SOLN
1250.0000 mg | Freq: Two times a day (BID) | INTRAVENOUS | Status: DC
Start: 2021-08-13 — End: 2021-08-13

## 2021-08-13 MED ORDER — SULFAMETHOXAZOLE-TRIMETHOPRIM 800-160 MG PO TABS
1.0000 | ORAL_TABLET | Freq: Two times a day (BID) | ORAL | 0 refills | Status: AC
  Filled 2021-08-13: qty 11, 6d supply, fill #0

## 2021-08-13 MED ORDER — SENNOSIDES-DOCUSATE SODIUM 8.6-50 MG PO TABS: 1.0000 | ORAL_TABLET | Freq: Every evening | ORAL | Status: DC | PRN

## 2021-08-13 MED ORDER — VANCOMYCIN HCL 1500 MG/300ML IV SOLN
1500.0000 mg | Freq: Once | INTRAVENOUS | Status: DC
Start: 2021-08-13 — End: 2021-08-13
  Filled 2021-08-13: qty 300

## 2021-08-13 MED ORDER — FUROSEMIDE 10 MG/ML IJ SOLN: 40.0000 mg | Freq: Once | INTRAMUSCULAR | Status: DC

## 2021-08-13 MED ORDER — ACETAMINOPHEN 325 MG PO TABS: 650.0000 mg | ORAL_TABLET | Freq: Four times a day (QID) | ORAL | Status: DC | PRN

## 2021-08-13 MED ORDER — ONDANSETRON HCL 4 MG PO TABS: 4.0000 mg | ORAL_TABLET | Freq: Four times a day (QID) | ORAL | Status: DC | PRN

## 2021-08-13 MED ORDER — NICOTINE 14 MG/24HR TD PT24
14.0000 mg | MEDICATED_PATCH | Freq: Every day | TRANSDERMAL | Status: DC
  Administered 2021-08-13: 14 mg via TRANSDERMAL
  Filled 2021-08-13: qty 1

## 2021-08-13 MED ORDER — PREDNISONE 20 MG PO TABS
40.0000 mg | ORAL_TABLET | Freq: Every day | ORAL | 0 refills | Status: AC
  Filled 2021-08-13: qty 8, 4d supply, fill #0

## 2021-08-13 MED ORDER — UMECLIDINIUM BROMIDE 62.5 MCG/ACT IN AEPB
1.0000 | INHALATION_SPRAY | Freq: Every day | RESPIRATORY_TRACT | Status: DC
  Administered 2021-08-13: 1 via RESPIRATORY_TRACT
  Filled 2021-08-13: qty 7

## 2021-08-13 MED ORDER — ACETAMINOPHEN 650 MG RE SUPP: 650.0000 mg | Freq: Four times a day (QID) | RECTAL | Status: DC | PRN

## 2021-08-13 MED ORDER — ATORVASTATIN CALCIUM 40 MG PO TABS
40.0000 mg | ORAL_TABLET | Freq: Every day | ORAL | Status: DC
Start: 2021-08-13 — End: 2021-08-13
  Administered 2021-08-13: 40 mg via ORAL
  Filled 2021-08-13: qty 1

## 2021-08-13 MED ORDER — FUROSEMIDE 80 MG PO TABS: 80.0000 mg | ORAL_TABLET | Freq: Every day | ORAL | 0 refills | Status: DC

## 2021-08-13 MED ORDER — ASPIRIN EC 81 MG PO TBEC
81.0000 mg | DELAYED_RELEASE_TABLET | Freq: Every day | ORAL | Status: DC
Start: 2021-08-13 — End: 2021-08-13
  Administered 2021-08-13: 81 mg via ORAL
  Filled 2021-08-13: qty 1

## 2021-08-13 MED ORDER — METOPROLOL SUCCINATE ER 25 MG PO TB24
25.0000 mg | ORAL_TABLET | Freq: Every day | ORAL | Status: DC
  Administered 2021-08-13: 25 mg via ORAL
  Filled 2021-08-13: qty 1

## 2021-08-13 MED ORDER — FUROSEMIDE 80 MG PO TABS: ORAL_TABLET | ORAL | 0 refills | Status: DC

## 2021-08-13 NOTE — Discharge Summary (Signed)
? ?Name: Francisco [name] ?MRN: [id number] ?DOB: [birth year] 61 y.o. ?PCP: Adron Bene, MD ? ?Date of Admission: 08/12/2021  6:49 PM ?Date of Discharge: 08/13/21 ?Attending Physician: Dr. Oswaldo Done ? ?Discharge Diagnosis: ?Principal Problem: ?  Acute exacerbation of congestive heart failure (HCC) ?  ? ?Discharge Medications: ?Allergies as of 08/13/2021   ? ?   Reactions  ? Ivp Dye [iodinated Contrast Media] Other (See Comments)  ? Seizures  ? Tramadol Other (See Comments)  ? Seizures  ? ?  ? ?  ?Medication List  ?  ? ?STOP taking these medications   ? ?azithromycin 250 MG tablet ?Commonly known as: ZITHROMAX ?  ?ibuprofen 200 MG tablet ?Commonly known as: ADVIL ?  ? ?  ? ?TAKE these medications   ? ?acetaminophen 325 MG tablet ?Commonly known as: TYLENOL ?Take 650 mg by mouth every 6 (six) hours as needed for moderate pain or headache. ?  ?Advair Diskus 250-50 MCG/ACT Aepb ?Generic drug: fluticasone-salmeterol ?Inhale 1 puff into the lungs in the morning and at bedtime. ?  ?albuterol 108 (90 Base) MCG/ACT inhaler ?Commonly known as: VENTOLIN HFA ?Inhale 2 puffs into the lungs every 6 (six) hours as needed for wheezing or shortness of breath ?  ?albuterol (2.5 MG/3ML) 0.083% nebulizer solution ?Commonly known as: PROVENTIL ?Use 1 vial (2.5 mg total) by nebulization every 6 (six) hours as needed for wheezing or shortness of breath. ?  ?aspirin EC 81 MG tablet ?Take 81 mg by mouth daily. Swallow whole. ?  ?atorvastatin 40 MG tablet ?Commonly known as: LIPITOR ?Take 1 tablet (40 mg total) by mouth daily. ?  ?diphenhydrAMINE 50 MG tablet ?Commonly known as: BENADRYL ?Take 1 tablet (50 mg total) by mouth once for 1 dose. Please take 50 mg 1 hour prior to your CT scan ?  ?furosemide 80 MG tablet ?Commonly known as: LASIX ?Take 2 tablets, 80 mg, once daily on Friday and Saturday.  Then restart 1 tablet, 40 mg dosing until you follow-up in clinic. ?What changed:  ?medication strength ?how much to take ?how to take this ?when to  take this ?additional instructions ?  ?lisinopril 20 MG tablet ?Commonly known as: ZESTRIL ?Take 1 tablet (20 mg total) by mouth daily. ?  ?metoprolol succinate 25 MG 24 hr tablet ?Commonly known as: TOPROL-XL ?Take 1 tablet (25 mg total) by mouth in the morning. ?  ?nicotine 14 mg/24hr patch ?Commonly known as: NICODERM CQ - dosed in mg/24 hours ?Place 1 patch (14 mg total) onto the skin daily. ?  ?nitroGLYCERIN 0.4 MG SL tablet ?Commonly known as: NITROSTAT ?Place 1 tablet (0.4 mg total) under the tongue every 5 (five) minutes x 3 doses as needed for chest pain. ?  ?predniSONE 20 MG tablet ?Commonly known as: DELTASONE ?Take 2 tablets (40 mg total) by mouth daily with breakfast for 4 days. ?Start taking on: August 14, 2021 ?What changed: when to take this ?  ?Spiriva HandiHaler 18 MCG inhalation capsule ?Generic drug: tiotropium ?Place 1 capsule (18 mcg total) into inhaler and inhale daily. ?  ?sulfamethoxazole-trimethoprim 800-160 MG tablet ?Commonly known as: BACTRIM DS ?Take 1 tablet by mouth every 12 (twelve) hours for 6 days. ?  ? ?  ? ? ?Disposition and follow-up:   ?Francisco [name] was discharged from [facility] in Stable condition.  At the hospital follow up visit please address: ? ?1.  Follow-up: ?a.  Cellulitis of right forearm- patient presented with purulent cellulitis.  He was discharged with Bactrim for 6  days until follow-up in clinic.  He left before [facility] pharmacy gave antibiotics.  I gave him the phone number for [facility] and will also call him to find out where the antibiotic should be sent. ?  ?b.  COPD exacerbation-5 days of prednisone 40 mg.  He received 1 dose prior to discharge.  He left prior to receiving prescription from [facility].  Please continue topic of tobacco cessation with patient.  Could consider trying Chantix or nicotine patch to help with cravings. ? ?c.  Acute on chronic heart failure-he was instructed to take Lasix 80 mg for Thursday, Friday and Saturday and to decrease  dose to 40 mg until he is able to follow-up in clinic. Echo showed EF of 45% with mildly decreased function. ? ?D. Chronic hepatitis C ?Patient is to follow-up with infectious disease in outpatient setting. ? ?2.  Labs / imaging needed at time of follow-up: BMP, CBC ? ?3.  Pending labs/ test needing follow-up: none ? ?4.  Medication Changes: ? Started: ? 1. Prednisone 40 mg for 4 additional days ? 2. Bactrim BID until 05/03 ? ?Changed: increase furosemide to 80 mg for 3 days then instructed to continue taking 40 mg until return to clinic ? Abx -  Bactrim End Date: 08/19/21 ? ?Follow-up Appointments: ?[facility] May 2nd at 2:15 ? ?Hospital Course by problem list: ?Acute on chronic heart failure with preserved ejection fraction (LV EF of 45%) ? ?Patient presented due to 2-week history of increased dyspnea on exertion.  He states that he does not take his Lasix regularly because he has difficulty urinating while at work and does not like to be woken up overnight to urinate.  On exam he appears mildly hypervolemic.  His vitals are stable and chest x-ray did not show signs concerning for pulmonary congestion.  Echo showed mildly reduced ejection fraction at 45%.  He is stable for outpatient management of exacerbation.  Lasix was increased to 80 mg once daily for 3 days and then continue with 40 mg daily.  He will follow-up in clinic on May 2. ? ?Acute exacerbation of COPD ?He endorses increased cough with sputum production.  Patient is saturating well on room air.  On lung exam, no wheezing appreciated in upper and lower lung fields.  He was given 1 dose of prednisone 40 mg prior to discharge.  Medications also include Spiriva and Advair.  He endorses taking these daily.  We talked about tobacco cessation and he states that he has had difficulty with this. ? ?Cellulitis of right lower wrist ?He states that his forearm has been swollen and red for the past several days.  He injected IV methamphetamine on Friday.  He denies  fever or chills.  On exam there is erythema with purulence at the distal, medial surface of forearm.  He does not have pain with flexion and extension of wrist.  Blood cell count within normal limits. Low concern for joint involvement with septic arthritis.  Will treat purulent cellulitis with oral Bactrim for 7 days. ? ?Methamphetamine use ?We talked with patient about risk reduction strategies.  Patient reports that this is only intermittent use. ? ?PAD ?HLD ?Medications include atorvastatin 40 mg ? ?Chronic hepatitis C ?Patient is to follow-up with infectious disease in outpatient setting. ? ?Pulmonary nodules ?CT chest in January showed discrete nodule areas related to infection or inflammation.  CT chest repeated and did not show further evidence of pulmonary nodules. ? ?Discharge Subjective: ?Patient assessed at bedside this AM.  He states that he started feeling short of breath about the last week.  He is continue to use his inhaler.  His wrist wrap..  The joint feels okay but skin is tight.  He is feeling better today than when he first came in.  He reports coughing a lot with yellow phlegm that is worse than normal cough.  He states that he would prefer to go home at this time and be treated with oral antibiotics and follow-up and [facility]. ? ?Addendum 12:30: ?Secure chatted by nursing staff because patient would like to leave.  Discharge orders were placed with plans for TOC to bring meds to bedside.  I talked with patient in person and asked him to call [facility] if he is not able to wait until pharmacy brings his meds to him prior to leaving.  I advised him to come back to the ED if he develops fevers, chills or worsening in the swelling or redness of his hand. I gave him the phone number for [facility]. ? ?Discharge Exam:   ?BP (!) 181/110 (BP Location: Left Arm)   Pulse (!) 103   Temp 97.9 ?F (36.6 ?C) (Oral)   Resp 20   Ht 6' (1.829 m)   Wt 90.7 kg    SpO2 98%   BMI 27.12 kg/m?  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

## 2021-08-13 NOTE — H&P (Addendum)
? ? ? ?Date: 08/13/2021     ?     ?     ?Patient Name:  Francisco [name] MRN: [id number]  ?DOB: 07/31/1960 Age / Sex: 61 y.o., male   ?PCP: Delene Ruffini, MD    ?     ?Medical Service: Internal Medicine Teaching Service    ?     ?Attending Physician: Dr. Sid Falcon, MD    ?First Contact: Dr. Howie Ill Pager: [phone]  ?Second Contact: Dr. Collene Gobble Pager: [phone]  ?     ?After Hours (After 5p/  First Contact Pager: [phone]  ?weekends / holidays): Second Contact Pager: [phone]  ? ?Chief Complaint: DOE, cough, right wrist swelling ? ?History of Present Illness: Francisco [name] is a 61 y.o. male with a PMHx of hypertension, PAD, HLD, CAD s/p stents to RCA, CHF (EF of 60 to 65% in January 2023), chronic untreated hep C, IV methamphetamine use, and COPD who presents to the ED today with a CC of DOE, cough, and right wrist swelling.  ? ?The patient endorses progressively worsening cough and shortness of breath for the past week.  He endorses a chronic cough with clear sputum, however now his sputum is yellow.  Denies orthopnea but does endorse dyspnea on exertion.  No recent illness.  No fevers, chills, headaches, abdominal pain, dysuria.  He has not run out of his medications at home.  He states that he has been clean from meth for about a month, however last Friday he relapsed and injected into his right wrist which led to significant swelling and redness in that area.  Of note, pt was seen in the ED a few days ago and recommended prednisone for COPD exacerbation but has not yet picked up this medication. No other complaints or concerns at this time. ? ?Meds:  ?Current Meds  ?Medication Sig  ? acetaminophen (TYLENOL) 325 MG tablet Take 650 mg by mouth every 6 (six) hours as needed for moderate pain or headache.  ? albuterol (PROVENTIL) (2.5 MG/3ML) 0.083% nebulizer solution Use 1 vial (2.5 mg total) by nebulization every 6 (six) hours as needed for wheezing or shortness of breath.  ? aspirin EC 81 MG tablet Take 81 mg by  mouth daily. Swallow whole.  ? atorvastatin (LIPITOR) 40 MG tablet Take 1 tablet (40 mg total) by mouth daily.  ? fluticasone-salmeterol (ADVAIR DISKUS) 250-50 MCG/ACT AEPB Inhale 1 puff into the lungs in the morning and at bedtime.  ? furosemide (LASIX) 40 MG tablet Take 1 tablet (40 mg total) by mouth daily.  ? ibuprofen (ADVIL) 200 MG tablet Take 800 mg by mouth every 6 (six) hours as needed for moderate pain or headache.  ? lisinopril (ZESTRIL) 20 MG tablet Take 1 tablet (20 mg total) by mouth daily.  ? metoprolol succinate (TOPROL-XL) 25 MG 24 hr tablet Take 1 tablet (25 mg total) by mouth in the morning.  ? nitroGLYCERIN (NITROSTAT) 0.4 MG SL tablet Place 1 tablet (0.4 mg total) under the tongue every 5 (five) minutes x 3 doses as needed for chest pain.  ? tiotropium (SPIRIVA) 18 MCG inhalation capsule Place 1 capsule (18 mcg total) into inhaler and inhale daily.  ? ? ?Allergies: ?Allergies as of 08/12/2021 - Review Complete 08/12/2021  ?Allergen Reaction Noted  ? Ivp dye [iodinated contrast media] Other (See Comments) 06/12/2018  ? Tramadol Other (See Comments) 06/12/2018  ? ?Past Medical History:  ?Diagnosis Date  ? CHF (congestive heart failure) ([place])   ? COPD (chronic obstructive  pulmonary disease) ([place])   ? Coronary artery disease   ? a. s/p prior PCI.  ? Hepatitis-C   ? Hypertension   ? IV drug abuse ([place])   ? a. 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  ? Medically noncompliant   ? MI (myocardial infarction) ([place])   ? Tobacco abuse   ? ?Family History:  ?Family History  ?Problem Relation Age of Onset  ? Rheum arthritis Mother   ? Heart disease Paternal Grandfather   ? ?Social History:  ?Lives With: Roommate ?Occupation: Traffic control  ?Support: Family lives in [place].  ?Level of Function: Performs his own ADL's and IADL's.  ?Substances: Previously used IV heroin, but no longer uses. Last use IV methamphetamine on Friday, but he has otherwise been clean for a month. Smoking 1/2ppd  cigarettes. No alcohol use.  ? ?Review of Systems: ?A complete ROS was negative except as per HPI.  ? ?Physical Exam: ?Blood pressure (!) 156/109, pulse 95, temperature 98.2 ?F (36.8 ?C), temperature source Oral, resp. rate (!) 24, height 6' (1.829 m), weight 90.7 kg, SpO2 100 %. ?General: NAD, nl appearance, breathing comfortably on 2L [place] ?HE: Normocephalic, atraumatic, EOMI, Conjunctivae normal ?ENT: No congestion, no rhinorrhea, no exudate or erythema  ?Cardiovascular: Normal rate, regular rhythm. No murmurs, rubs, or gallops, distal pulses are intact, mild JVD to the midneck ?Pulmonary: Effort normal, crackles in left lower lung field, and expiratory wheezing throughout, no respiratory distress noted ?Abdominal: soft, nontender, bowel sounds present ?Musculoskeletal: 2+ pitting edema in BLE going up to the knee. No deformity, injury or tenderness in extremities ?Skin: Warm, dry. Right wrist: There is edema and erythema of the right wrist with yellow-brownish discharge noted. No fluctuance. NVI. See media tab for photos. ?Psychiatric/Behavioral: normal mood, normal behavior    ? ?EKG: personally reviewed my interpretation is sinus tachycardia ? ?CXR: personally reviewed my interpretation is unremarkable ? ? ?Assessment & Plan by Problem: ?Principal Problem: ?  Acute exacerbation of congestive heart failure ([place]) ? ?HFpEF exacerbation ?Chronic systolic and diastolic heart failure (LVEF 60-65%) ?Nonischemic dilated cardiomyopathy ?CAD ?HTN ?TEE obtained during prior admission (04/20/21) with LVEF 60-65%, mild LVH, otherwise normal, with no regional wall abnormalities or valvular disease. Cath in 2021 showed nonischemic cardiomyopathy. Previously unable to afford medications though pt endorses recent compliance to his current medications. The patient presents here with signs of volume overload on exam and labs significant for BNP 980, troponin 34-> 35, likely demand ischemia. Lactic acid 0.9. CXR WNL. Given these  findings, will initiate diuresis as below. ?-Lisinopril 20 mg daily ?-IV Lasix 40 mg daily ?-Continue aspirin ?-Daily weights ?-Strict I/O's ?-Replete as needed for K>4, Mag >2 ? ?COPD exacerbation ?Patient also presents here with cardinal symptoms of a COPD exacerbation. He was hypertensive and tachypneic on arrival, but he is otherwise hemodynamically stable. Initial pulse ox of 94% on RA, increased to 97% on 2L .  ?-Prednisone 40 mg daily for 5 days ?-Breo Ellipta inhalers ?-Spiriva inhalers ?-Albuterol prn ? ?Cellulitis, right wrist ?Methamphetamine abuse ?IV drug use ?The patient states that he has been clean from meth for about a month, however last Friday he relapsed once and now endorses erythema and swelling of the right wrist with some discharge.  He has already been placed on IV vancomycin for treatment of IV drug abuse associated cellulitis.  Work-up is also underway for possible endocarditis with the patient's drug use. Low suspicion for endocarditis currently given pt is otherwise doing well, afebrile, WBC WNL, no murmur  appreciated on exam. ?-IV vancomycin ?-CRP and ESR pending ?-Cultures pending ?-Echocardiogram pending ? ?PAD ?HLD ?-Continue atorvastatin 40 mg  ? ?Chronic hepatitis C  ?Reported chronic hep C during a prior admission, but not labs on file.  Labs from patient's prior hospitalization showing Hep C positive with quantification 5.5 million.  During his last admission from January, it was recommended that the patient follow-up with ID in the outpatient setting, however it does not appear that an appointment has been arranged yet. ?-Schedule pt for outpatient ID follow up  ? ?Pulmonary nodules ?CT chest was obtained in January 2023 and showed several discrete nodular areas most likely related to infection or inflammation.  Given his history of COPD, recommendation to obtain repeat imaging in 8-12 weeks.  According to records from patient's recent office visit from March, the patient has  been unable to afford CT scan and is in the process of obtaining an orange card.  ?-CT chest pending ? ?Dispo: Admit patient to Observation with expected length of stay less than 2 midnights. ? ?Signed: ?Hubbard Robinson

## 2021-08-13 NOTE — Progress Notes (Signed)
?  Echocardiogram ?2D Echocardiogram has been performed. ? Francisco [name] ?08/13/2021, 10:23 AM ?

## 2021-08-13 NOTE — ED Provider Notes (Signed)
?[facility] ?Provider Note ? ? ?CSN: [id number] ?Arrival date & time: 08/12/21  1847 ? ?  ? ?History ? ?Chief Complaint  ?Patient presents with  ? Shortness of Breath  ? ? ?Francisco [name] is a 61 y.o. male. ? ?Patient presents to the emergency department for evaluation of shortness of breath.  Patient reports that he has had ongoing shortness of breath for approximately 5 days.  He does report a history of congestive heart failure.  In this setting, however, he has been experiencing a cough that has been productive of yellow mucus.  He has not noticed any obvious fever.  He has been using his nebulizer that has helped somewhat. ? ?Patient also reports pain, redness, swelling of the right wrist with spontaneous drainage tonight.  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 ? ? ?  ? ?Home Medications ?Prior to Admission medications   ?Medication Sig Start Date End Date Taking? Authorizing Provider  ?acetaminophen (TYLENOL) 325 MG tablet Take 650 mg by mouth every 6 (six) hours as needed for moderate pain or headache.    [provider]  ?albuterol (PROVENTIL) (2.5 MG/3ML) 0.083% nebulizer solution Use 1 vial (2.5 mg total) by nebulization every 6 (six) hours as needed for wheezing or shortness of breath. 06/24/21   Delene Ruffini, MD  ?albuterol (VENTOLIN HFA) 108 (90 Base) MCG/ACT inhaler Inhale 2 puffs into the lungs every 6 (six) hours as needed for wheezing or shortness of breath 06/24/21   Delene Ruffini, MD  ?atorvastatin (LIPITOR) 40 MG tablet Take 1 tablet (40 mg total) by mouth daily. 06/24/21   Delene Ruffini, MD  ?azithromycin (ZITHROMAX) 250 MG tablet Complete course with one pill on 01/27. 05/15/21   Masters, Joellen [place], DO  ?diphenhydrAMINE (BENADRYL) 50 MG tablet Take 1 tablet (50 mg total) by mouth once for 1 dose. Please take 50 mg 1 hour prior to your CT scan 05/25/21 05/25/21  Iona Beard, MD  ?fluticasone-salmeterol (ADVAIR DISKUS)  250-50 MCG/ACT AEPB Inhale 1 puff into the lungs in the morning and at bedtime. 07/01/21 09/29/21  Delene Ruffini, MD  ?furosemide (LASIX) 40 MG tablet Take 1 tablet (40 mg total) by mouth daily. 04/23/21   Delene Ruffini, MD  ?lisinopril (ZESTRIL) 20 MG tablet Take 1 tablet (20 mg total) by mouth daily. 07/01/21 09/29/21  Delene Ruffini, MD  ?metoprolol succinate (TOPROL-XL) 25 MG 24 hr tablet Take 1 tablet (25 mg total) by mouth in the morning. 06/24/21   Delene Ruffini, MD  ?nicotine (NICODERM CQ - DOSED IN MG/24 HOURS) 14 mg/24hr patch Place 1 patch (14 mg total) onto the skin daily. 06/24/21   Delene Ruffini, MD  ?nitroGLYCERIN (NITROSTAT) 0.4 MG SL tablet Place 1 tablet (0.4 mg total) under the tongue every 5 (five) minutes x 3 doses as needed for chest pain. 04/21/21   Masters, Joellen [place], DO  ?predniSONE (DELTASONE) 20 MG tablet Take 2 tablets (40 mg total) by mouth daily for 4 days. 08/09/21 08/13/21  Maudie Flakes, MD  ?tiotropium (SPIRIVA) 18 MCG inhalation capsule Place 1 capsule (18 mcg total) into inhaler and inhale daily. 04/23/21   Delene Ruffini, MD  ?   ? ?Allergies    ?Ivp dye [iodinated contrast media] and Tramadol   ? ?Review of Systems   ?Review of Systems  ?Respiratory:  Positive for cough and shortness of breath.   ?Cardiovascular:  Positive for chest pain.  ?Skin:  Positive for color change and wound.  ? ?  Physical Exam ?Updated Vital Signs ?BP (!) 189/119   Pulse (!) 101   Temp 98.2 ?F (36.8 ?C) (Oral)   Resp 16   Ht 6' (1.829 m)   Wt 90.7 kg   SpO2 97%   BMI 27.12 kg/m?  ?Physical Exam ?Vitals and nursing note reviewed.  ?Constitutional:   ?   General: He is not in acute distress. ?   Appearance: He is well-developed.  ?HENT:  ?   Head: Normocephalic and atraumatic.  ?   Mouth/Throat:  ?   Mouth: Mucous membranes are moist.  ?Eyes:  ?   General: Vision grossly intact. Gaze aligned appropriately.  ?   Extraocular Movements: Extraocular movements intact.  ?   Conjunctiva/sclera:  Conjunctivae normal.  ?Cardiovascular:  ?   Rate and Rhythm: Normal rate and regular rhythm.  ?   Pulses: Normal pulses.  ?   Heart sounds: Normal heart sounds, S1 normal and S2 normal. No murmur heard. ?  No friction rub. No gallop.  ?Pulmonary:  ?   Effort: Pulmonary effort is normal. No respiratory distress.  ?   Breath sounds: Normal breath sounds.  ?Abdominal:  ?   Palpations: Abdomen is soft.  ?   Tenderness: There is no abdominal tenderness. There is no guarding or rebound.  ?   Hernia: No hernia is present.  ?Musculoskeletal:     ?   General: No swelling.  ?   Cervical back: Full passive range of motion without pain, normal range of motion and neck supple. No pain with movement, spinous process tenderness or muscular tenderness. Normal range of motion.  ?   Right lower leg: No edema.  ?   Left lower leg: No edema.  ?Skin: ?   General: Skin is warm and dry.  ?   Capillary Refill: Capillary refill takes less than 2 seconds.  ?   Findings: Erythema (R wriat with induration, drainage - no fluctuance) present. No ecchymosis, lesion or wound.  ?Neurological:  ?   Mental Status: He is alert and oriented to person, place, and time.  ?   GCS: GCS eye subscore is 4. GCS verbal subscore is 5. GCS motor subscore is 6.  ?   Cranial Nerves: Cranial nerves 2-12 are intact.  ?   Sensory: Sensation is intact.  ?   Motor: Motor function is intact. No weakness or abnormal muscle tone.  ?   Coordination: Coordination is intact.  ?Psychiatric:     ?   Mood and Affect: Mood normal.     ?   Speech: Speech normal.     ?   Behavior: Behavior normal.  ? ? ?ED Results / Procedures / Treatments   ?Labs ?(all labs ordered are listed, but only abnormal results are displayed) ?Labs Reviewed  ?BRAIN NATRIURETIC PEPTIDE - Abnormal; Notable for the following components:  ?    Result Value  ? B Natriuretic Peptide 980.1 (*)   ? All other components within normal limits  ?COMPREHENSIVE METABOLIC PANEL - Abnormal; Notable for the following  components:  ? Glucose, Bld 144 (*)   ? Calcium 8.7 (*)   ? Total Protein 6.4 (*)   ? Albumin 3.4 (*)   ? AST 51 (*)   ? ALT 71 (*)   ? All other components within normal limits  ?CBC WITH DIFFERENTIAL/PLATELET - Abnormal; Notable for the following components:  ? Monocytes Absolute 1.2 (*)   ? All other components within normal limits  ?TROPONIN I (HIGH  SENSITIVITY) - Abnormal; Notable for the following components:  ? Troponin I (High Sensitivity) 34 (*)   ? All other components within normal limits  ?CULTURE, BLOOD (ROUTINE X 2)  ?CULTURE, BLOOD (ROUTINE X 2)  ?LACTIC ACID, PLASMA  ?LACTIC ACID, PLASMA  ?SEDIMENTATION RATE  ?C-REACTIVE PROTEIN  ?TROPONIN I (HIGH SENSITIVITY)  ? ? ?EKG ?EKG Interpretation ? ?Date/Time:  Wednesday August 12 2021 19:26:37 EDT ?Ventricular Rate:  112 ?PR Interval:  136 ?QRS Duration: 90 ?QT Interval:  310 ?QTC Calculation: 423 ?R Axis:   70 ?Text Interpretation: Sinus tachycardia ST & T wave abnormality, consider inferior ischemia ST & T wave abnormality, consider anterolateral ischemia Abnormal ECG When compared with ECG of 08-Aug-2021 18:35, No significant change was found Confirmed by Orpah Greek [phone]) on 08/13/2021 3:02:02 AM ? ?Radiology ?DG Chest 2 View ? ?Result Date: 08/12/2021 ?CLINICAL DATA:  Shortness of breath, cough EXAM: CHEST - 2 VIEW COMPARISON:  08/08/2021 FINDINGS: Lungs are clear.  No pleural effusion or pneumothorax. The heart is normal in size. Visualized osseous structures are within normal limits. IMPRESSION: Normal chest radiographs. Electronically Signed   By: Julian Hy M.D.   On: 08/12/2021 19:43   ? ?Procedures ?Procedures  ? ? ?Medications Ordered in ED ?Medications  ?furosemide (LASIX) injection 40 mg (has no administration in time range)  ? ? ?ED Course/ Medical Decision Making/ A&P ?  ?                        ?Medical Decision Making ?Amount and/or Complexity of Data Reviewed ?Labs: ordered. ? ? ?Patient presents to the emergency  department for evaluation of chest pain and shortness of breath.  He also has redness, swelling of the right wrist secondary to IV drug abuse.  Differential diagnosis is broad.  This includes acute coronary syndrome, congesti

## 2021-08-13 NOTE — ED Notes (Signed)
Per admitting provider, oxygen turned off at this time.  ?

## 2021-08-13 NOTE — ED Notes (Signed)
No IV able to be obtained by IV team. ER and admitting providers aware patient remains with no IV access at this time.  ?

## 2021-08-13 NOTE — Hospital Course (Addendum)
Felt short ofr breath past week. Has been using inhaler ?Wrist ruptured. Joint feels okay, just tight skin. ?Feeling better today than when he first came in. ?Reports coughing up a lot of yellow phlegm.. worse than typical.   ?IV has not been able to be started ? ?

## 2021-08-13 NOTE — ED Notes (Signed)
Pt left before getting meds from [facility] pharmacy and left without letting this RN update is vitals.  ?

## 2021-08-13 NOTE — ED Notes (Signed)
Patient to CT at this time

## 2021-08-13 NOTE — Progress Notes (Signed)
Pharmacy Antibiotic Note ? ?Francisco [name] is a 62 y.o. male admitted on 08/12/2021 with cellulitis.  Pharmacy has been consulted for vancomycin dosing. ? ?Plan: ?Vancomycin 1500mg  x1 then 1250mg  IV Q12H. Goal AUC 400-550.  Expected AUC 540.  SCr 1.08.  ? ?Height: 6' (182.9 cm) ?Weight: 90.7 kg (200 lb) ?IBW/kg (Calculated) : 77.6 ? ?Temp (24hrs), Avg:98.3 ?F (36.8 ?C), Min:98.2 ?F (36.8 ?C), Max:98.6 ?F (37 ?C) ? ?Recent Labs  ?Lab 08/08/21 ?1923 08/12/21 ?1919 08/13/21 ?0246  ?WBC 14.3* 10.5  --   ?CREATININE 1.08 1.08  --   ?LATICACIDVEN  --   --  0.9  ?  ?Estimated Creatinine Clearance: 79.8 mL/min (by C-G formula based on SCr of 1.08 mg/dL).   ? ?Allergies  ?Allergen Reactions  ? Ivp Dye [Iodinated Contrast Media] Other (See Comments)  ?  Seizures ?  ? Tramadol Other (See Comments)  ?  Seizures ?  ? ? ?Thank you for allowing pharmacy to be a part of this patient?s care. ? ?08/14/21, PharmD, BCPS  ?08/13/2021 5:47 AM ? ?

## 2021-08-15 NOTE — Progress Notes (Deleted)
   Office Visit   Patient ID: Francisco [name], male    DOB: July 14, 1960, 61 y.o.   MRN: [id number]   PCP: Delene Ruffini, MD   Subjective:   Francisco [name] is a 61 y.o. year old chronically ill male with CHF, dilated cardiomyopathy, CAD s/p PCI, hypertension, COPD, tobacco use, and amphetamine use disorder who presents for hospital follow up.   He presented to [facility] 4/27 for shortness of breath and was admitted for CHF exacerbation, COPD exacerbation, and purulent cellulitis of the right forearm. Echocardiogram revealed a reduction in EF from 60-65% to 45%, no valvular stenosis/regurg.   He was discharged with prescriptions for lasix 80mg  x3 days followed by 40mg , 5d course of prednisone, and 6d course of bactrim. Unfortunately, he did not wait for transitions of care to deliver these medications.      Objective:   There were no vitals taken for this visit.  Assessment & Plan:   Problem List Items Addressed This Visit   None    No follow-ups on file.   Pt discussed with ***  Mitzi Hansen, MD Internal Medicine Resident PGY-3 [facility] 08/15/2021 8:00 AM

## 2021-08-18 ENCOUNTER — Encounter: Payer: Self-pay | Admitting: Internal Medicine

## 2021-08-18 DIAGNOSIS — I5033 Acute on chronic diastolic (congestive) heart failure: Secondary | ICD-10-CM

## 2021-08-18 DIAGNOSIS — L03113 Cellulitis of right upper limb: Secondary | ICD-10-CM

## 2021-08-18 DIAGNOSIS — Z09 Encounter for follow-up examination after completed treatment for conditions other than malignant neoplasm: Secondary | ICD-10-CM

## 2021-08-18 DIAGNOSIS — J441 Chronic obstructive pulmonary disease with (acute) exacerbation: Secondary | ICD-10-CM

## 2021-08-19 ENCOUNTER — Encounter: Payer: Self-pay | Admitting: Internal Medicine

## 2021-08-20 LAB — CULTURE, BLOOD (ROUTINE X 2)
Culture: NO GROWTH
Special Requests: ADEQUATE

## 2021-08-24 ENCOUNTER — Other Ambulatory Visit: Payer: Self-pay | Admitting: Internal Medicine

## 2021-08-24 ENCOUNTER — Other Ambulatory Visit (HOSPITAL_COMMUNITY): Payer: Self-pay

## 2021-08-25 ENCOUNTER — Other Ambulatory Visit (HOSPITAL_COMMUNITY): Payer: Self-pay

## 2021-08-28 ENCOUNTER — Other Ambulatory Visit: Payer: Self-pay | Admitting: Internal Medicine

## 2021-08-28 ENCOUNTER — Other Ambulatory Visit (HOSPITAL_COMMUNITY): Payer: Self-pay

## 2021-08-28 MED ORDER — FUROSEMIDE 40 MG PO TABS
40.0000 mg | ORAL_TABLET | Freq: Every day | ORAL | 0 refills | Status: DC
  Filled 2021-08-28: qty 30, 30d supply, fill #0

## 2021-08-28 MED ORDER — ALBUTEROL SULFATE HFA 108 (90 BASE) MCG/ACT IN AERS
2.0000 | INHALATION_SPRAY | Freq: Four times a day (QID) | RESPIRATORY_TRACT | 2 refills | Status: DC | PRN
  Filled 2021-08-28: qty 6.7, 25d supply, fill #0
  Filled 2021-09-23: qty 6.7, 25d supply, fill #1

## 2021-08-28 NOTE — Telephone Encounter (Signed)
Please schedule patient for hospital follow up appointment as soon as possible. No further refills will be sent without follow up. Thank you.  ?

## 2021-08-31 ENCOUNTER — Other Ambulatory Visit (HOSPITAL_COMMUNITY): Payer: Self-pay

## 2021-09-02 ENCOUNTER — Other Ambulatory Visit (HOSPITAL_COMMUNITY): Payer: Self-pay

## 2021-09-09 ENCOUNTER — Encounter: Payer: Self-pay | Admitting: Internal Medicine

## 2021-09-21 ENCOUNTER — Ambulatory Visit (INDEPENDENT_AMBULATORY_CARE_PROVIDER_SITE_OTHER): Payer: Self-pay | Admitting: Student

## 2021-09-21 ENCOUNTER — Encounter: Payer: Self-pay | Admitting: Student

## 2021-09-21 ENCOUNTER — Other Ambulatory Visit: Payer: Self-pay

## 2021-09-21 ENCOUNTER — Other Ambulatory Visit (HOSPITAL_COMMUNITY): Payer: Self-pay

## 2021-09-21 DIAGNOSIS — F17210 Nicotine dependence, cigarettes, uncomplicated: Secondary | ICD-10-CM

## 2021-09-21 DIAGNOSIS — I10 Essential (primary) hypertension: Secondary | ICD-10-CM

## 2021-09-21 DIAGNOSIS — I110 Hypertensive heart disease with heart failure: Secondary | ICD-10-CM

## 2021-09-21 DIAGNOSIS — I5042 Chronic combined systolic (congestive) and diastolic (congestive) heart failure: Secondary | ICD-10-CM

## 2021-09-21 DIAGNOSIS — L03113 Cellulitis of right upper limb: Secondary | ICD-10-CM

## 2021-09-21 MED ORDER — METOPROLOL SUCCINATE ER 25 MG PO TB24
25.0000 mg | ORAL_TABLET | Freq: Every morning | ORAL | 2 refills | Status: DC
  Filled 2021-09-21: qty 30, 30d supply, fill #0
  Filled 2021-10-26: qty 30, 30d supply, fill #1
  Filled 2021-11-30: qty 30, 30d supply, fill #2

## 2021-09-21 MED ORDER — ATORVASTATIN CALCIUM 40 MG PO TABS
40.0000 mg | ORAL_TABLET | Freq: Every day | ORAL | 2 refills | Status: DC
  Filled 2021-09-21: qty 30, 30d supply, fill #0
  Filled 2021-10-26: qty 30, 30d supply, fill #1
  Filled 2021-12-11: qty 30, 30d supply, fill #2

## 2021-09-21 MED ORDER — LISINOPRIL 20 MG PO TABS
20.0000 mg | ORAL_TABLET | Freq: Every day | ORAL | 2 refills | Status: DC
  Filled 2021-09-21: qty 30, 30d supply, fill #0
  Filled 2021-10-26: qty 30, 30d supply, fill #1
  Filled 2021-11-30: qty 30, 30d supply, fill #2

## 2021-09-21 MED ORDER — LISINOPRIL 20 MG PO TABS
20.0000 mg | ORAL_TABLET | Freq: Every day | ORAL | 2 refills | Status: DC
  Filled 2021-09-21: qty 30, 30d supply, fill #0

## 2021-09-21 MED ORDER — AMLODIPINE BESYLATE 5 MG PO TABS
5.0000 mg | ORAL_TABLET | Freq: Every day | ORAL | 2 refills | Status: DC
  Filled 2021-09-21: qty 30, 30d supply, fill #0
  Filled 2021-10-26: qty 30, 30d supply, fill #1
  Filled 2021-11-30: qty 30, 30d supply, fill #2

## 2021-09-21 MED ORDER — ATORVASTATIN CALCIUM 40 MG PO TABS
40.0000 mg | ORAL_TABLET | Freq: Every day | ORAL | 2 refills | Status: DC
  Filled 2021-09-21: qty 30, 30d supply, fill #0

## 2021-09-21 MED ORDER — METOPROLOL SUCCINATE ER 25 MG PO TB24
25.0000 mg | ORAL_TABLET | Freq: Every morning | ORAL | 2 refills | Status: DC
  Filled 2021-09-21: qty 30, 30d supply, fill #0

## 2021-09-21 NOTE — Assessment & Plan Note (Signed)
Patient developed cellulitis of R wrist a few weeks ago and was seen in the ED for this. Given history of IVDU, initially treated with vancomycin but switched to bactrim for treatment as outpatient. He has completed course of bactrim. On exam today, he does have needle marks on bilateral upper extremities, but no tenderness, increased warmth, swelling/fluctuance, or erythema noted in R upper extremity.   Plan: -completed course of bactrim

## 2021-09-21 NOTE — Assessment & Plan Note (Signed)
Patient with history of combined systolic and diastolic CHF, currently on lisinopril, toprol-xl, and lasix for GDMT. HF likely 2/2 dilated CDM with background of IVDU. Most recent ECHO on 08/13/2021 showing LVEF 45%, moderately reduced RV systolic function, moderate pulmonary HTN, and moderate aortic valve stenosis. Unable to fully evaluate for RWMA or LV diastolic parameters (but prior ECHO in 07/2020 showing Grade 1 diastolic dysfunction). It is unclear as to what dose of lasix patient was on, but appears lasix 40mg  daily was most recent prescription so will continue with this dose for now. He does not have any signs of volume overload on my exam today. In terms of GDMT, will continue current medications at this time due to uninsured status but continue to consider addition of farxiga and imdur-hydral (BiDil) in the future if able.   Plan: -continue toprol-xl, lisinopril, lasix -add farxiga and/or imdur-hydral in the future

## 2021-09-21 NOTE — Patient Instructions (Addendum)
Mr. Passe,  It was a pleasure seeing you in the clinic today.   Please continue to use your inhalers to help control your COPD. You have refills at your pharmacy. Please continue to cut down and eventually stop smoking cigarettes to help better control your COPD as well. Also, wear masks at work to prevent the dust from impacting your breathing. I have refilled your blood pressure medicines (toprol and lisinopril), so please pick them up from the pharmacy. I have also added a new medicine, called amlodipine (also called norvasc), to help control your blood pressure. Please come back in 1 month for a follow up visit.  Please call our clinic at [phone] if you have any questions or concerns. The best time to call is Monday-Friday from 9am-4pm, but there is someone available 24/7 at the same number. If you need medication refills, please notify your pharmacy one week in advance and they will send [place] a request.   Thank you for letting us take part in your care. We look forward to seeing you next time!

## 2021-09-21 NOTE — Progress Notes (Signed)
   CC: ED f/u visit for R wrist cellulitis, COPD, HTN  HPI:  [name] is a 60 y.o. male with history listed below presenting to the [facility] for ED f/u visit for R wrist cellulitis, COPD, HTN. Please see individualized problem based charting for full HPI.  Past Medical History:  Diagnosis Date   CHF (congestive heart failure) (HCC)    COPD (chronic obstructive pulmonary disease) (HCC)    Coronary artery disease    a. s/p prior PCI.   Hepatitis-C    Hypertension    IV drug abuse (HCC)    a. 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   Medically noncompliant    MI (myocardial infarction) (HCC)    Tobacco abuse     Review of Systems:  Negative aside from that listed in individualized problem based charting.  Physical Exam:  Vitals:   09/21/21 1012 09/21/21 1051  BP: (!) 172/111 (!) 175/114  Pulse: 95 96  Resp: (!) 28   Temp: 98.7 F (37.1 C)   TempSrc: Oral   SpO2: 95%   Weight: 192 lb (87.1 kg)   Height: 6' (1.829 m)    Physical Exam Constitutional:      Appearance: Normal appearance. He is not ill-appearing.  HENT:     Nose: Nose normal.     Mouth/Throat:     Mouth: Mucous membranes are moist.     Pharynx: Oropharynx is clear. No oropharyngeal exudate.  Eyes:     Extraocular Movements: Extraocular movements intact.     Conjunctiva/sclera: Conjunctivae normal.     Pupils: Pupils are equal, round, and reactive to light.  Cardiovascular:     Rate and Rhythm: Normal rate and regular rhythm.     Pulses: Normal pulses.     Heart sounds: Normal heart sounds.    No friction rub. No gallop.  Pulmonary:     Effort: Pulmonary effort is normal.     Breath sounds: No wheezing, rhonchi or rales.     Comments: Decreased air entry in bilateral bases, no adventitious sounds noted. Abdominal:     General: Bowel sounds are normal. There is no distension.     Palpations: Abdomen is soft.     Tenderness: There is no abdominal tenderness.  Musculoskeletal:         General: No swelling. Normal range of motion.  Skin:    General: Skin is warm and dry.     Comments: Needle marks noted in bilateral upper extremities. R upper extremitiy with no tenderness, redness, warmth, or fluctuance noted.  Neurological:     General: No focal deficit present.     Mental Status: He is alert and oriented to person, place, and time.  Psychiatric:        Mood and Affect: Mood normal.        Behavior: Behavior normal.        Thought Content: Thought content normal.     Assessment & Plan:   See Encounters Tab for problem based charting.  Patient discussed with Dr. Heide Spark

## 2021-09-21 NOTE — Assessment & Plan Note (Signed)
Patient with history of HTN, currently on toprol-xl 25mg  daily and lisinopril 20mg  daily. Blood pressure significantly above goal today and will need additional antihypertensive therapy for optimal control. He does note he is adhering to his medications. Consider addition of BiDil but this may be cost-prohibitive. Will instead add norvasc 5mg  daily through patient assistance program ($5 list).   Plan: -continue toprol-xl and lisinopril -add norvasc 5mg  daily -f/u in 1 month for BP recheck, can up-titrate medication at that time if needed

## 2021-09-23 ENCOUNTER — Other Ambulatory Visit (HOSPITAL_COMMUNITY): Payer: Self-pay

## 2021-09-24 ENCOUNTER — Other Ambulatory Visit (HOSPITAL_COMMUNITY): Payer: Self-pay

## 2021-09-24 NOTE — Progress Notes (Signed)
Internal Medicine Clinic Attending  Case discussed with Dr. Jinwala  At the time of the visit.  We reviewed the resident's history and exam and pertinent patient test results.  I agree with the assessment, diagnosis, and plan of care documented in the resident's note.  

## 2021-09-25 ENCOUNTER — Other Ambulatory Visit: Payer: Self-pay | Admitting: *Deleted

## 2021-09-25 ENCOUNTER — Other Ambulatory Visit (HOSPITAL_COMMUNITY): Payer: Self-pay

## 2021-09-25 MED ORDER — ALBUTEROL SULFATE HFA 108 (90 BASE) MCG/ACT IN AERS
2.0000 | INHALATION_SPRAY | Freq: Four times a day (QID) | RESPIRATORY_TRACT | 2 refills | Status: DC | PRN
Start: 2021-09-25 — End: 2022-09-27

## 2021-09-25 NOTE — Telephone Encounter (Signed)
Call from pt who stated he needs a refill on Albuterol inhaler but [facility] pharmacy is currently out until Monday. He's requesting refill to be sent to [facility] pharmacy. Thanks

## 2021-09-28 ENCOUNTER — Other Ambulatory Visit (HOSPITAL_COMMUNITY): Payer: Self-pay

## 2021-10-16 ENCOUNTER — Other Ambulatory Visit: Payer: Self-pay | Admitting: Internal Medicine

## 2021-10-16 ENCOUNTER — Other Ambulatory Visit (HOSPITAL_COMMUNITY): Payer: Self-pay

## 2021-10-19 ENCOUNTER — Other Ambulatory Visit (HOSPITAL_COMMUNITY): Payer: Self-pay

## 2021-10-19 MED ORDER — ALBUTEROL SULFATE HFA 108 (90 BASE) MCG/ACT IN AERS
2.0000 | INHALATION_SPRAY | Freq: Four times a day (QID) | RESPIRATORY_TRACT | 2 refills | Status: DC | PRN
  Filled 2021-10-19: qty 6.7, 25d supply, fill #0
  Filled 2021-11-27: qty 6.7, 25d supply, fill #1
  Filled 2021-12-22: qty 6.7, 25d supply, fill #2

## 2021-10-19 NOTE — Telephone Encounter (Signed)
Pt calling back stating he needs his inhaler by today.  Pt states he called on 10/16/2021 and no one has responded.   The pt does not want the med called in [facility]   albuterol (VENTOLIN HFA) 108 (90 Base) MCG/ACT inhaler  [facility] (Ph: [phone])

## 2021-10-21 ENCOUNTER — Observation Stay (HOSPITAL_COMMUNITY)
Admission: EM | Admit: 2021-10-21 | Discharge: 2021-10-22 | Discharge status: Against Medical Advice | Payer: Self-pay | Attending: Internal Medicine | Admitting: Internal Medicine

## 2021-10-21 ENCOUNTER — Emergency Department (HOSPITAL_COMMUNITY): Payer: Self-pay

## 2021-10-21 ENCOUNTER — Encounter (HOSPITAL_COMMUNITY): Payer: Self-pay | Admitting: Emergency Medicine

## 2021-10-21 ENCOUNTER — Other Ambulatory Visit: Payer: Self-pay

## 2021-10-21 DIAGNOSIS — I5042 Chronic combined systolic (congestive) and diastolic (congestive) heart failure: Secondary | ICD-10-CM | POA: Insufficient documentation

## 2021-10-21 DIAGNOSIS — J441 Chronic obstructive pulmonary disease with (acute) exacerbation: Secondary | ICD-10-CM

## 2021-10-21 DIAGNOSIS — I2510 Atherosclerotic heart disease of native coronary artery without angina pectoris: Secondary | ICD-10-CM | POA: Insufficient documentation

## 2021-10-21 DIAGNOSIS — R0602 Shortness of breath: Secondary | ICD-10-CM

## 2021-10-21 DIAGNOSIS — N179 Acute kidney failure, unspecified: Secondary | ICD-10-CM | POA: Insufficient documentation

## 2021-10-21 DIAGNOSIS — Z20822 Contact with and (suspected) exposure to covid-19: Secondary | ICD-10-CM | POA: Insufficient documentation

## 2021-10-21 DIAGNOSIS — R062 Wheezing: Secondary | ICD-10-CM | POA: Insufficient documentation

## 2021-10-21 DIAGNOSIS — I110 Hypertensive heart disease with heart failure: Secondary | ICD-10-CM | POA: Insufficient documentation

## 2021-10-21 HISTORY — DX: Chronic obstructive pulmonary disease with (acute) exacerbation: J44.1

## 2021-10-21 LAB — CBC WITH DIFFERENTIAL/PLATELET
Abs Immature Granulocytes: 0.05 10*3/uL (ref 0.00–0.07)
Basophils Absolute: 0.1 10*3/uL (ref 0.0–0.1)
Basophils Relative: 1 %
Eosinophils Absolute: 0.2 10*3/uL (ref 0.0–0.5)
Eosinophils Relative: 2 %
HCT: 48.6 % (ref 39.0–52.0)
Hemoglobin: 16 g/dL (ref 13.0–17.0)
Immature Granulocytes: 1 %
Lymphocytes Relative: 30 %
Lymphs Abs: 2.7 10*3/uL (ref 0.7–4.0)
MCH: 31.4 pg (ref 26.0–34.0)
MCHC: 32.9 g/dL (ref 30.0–36.0)
MCV: 95.3 fL (ref 80.0–100.0)
Monocytes Absolute: 0.7 10*3/uL (ref 0.1–1.0)
Monocytes Relative: 7 %
Neutro Abs: 5.5 10*3/uL (ref 1.7–7.7)
Neutrophils Relative %: 59 %
Platelets: 242 10*3/uL (ref 150–400)
RBC: 5.1 MIL/uL (ref 4.22–5.81)
RDW: 13.4 % (ref 11.5–15.5)
WBC: 9.2 10*3/uL (ref 4.0–10.5)
nRBC: 0 % (ref 0.0–0.2)

## 2021-10-21 LAB — COMPREHENSIVE METABOLIC PANEL
ALT: 123 U/L — ABNORMAL HIGH (ref 0–44)
AST: 59 U/L — ABNORMAL HIGH (ref 15–41)
Albumin: 4 g/dL (ref 3.5–5.0)
Alkaline Phosphatase: 59 U/L (ref 38–126)
Anion gap: 15 (ref 5–15)
BUN: 20 mg/dL (ref 6–20)
CO2: 23 mmol/L (ref 22–32)
Calcium: 10.1 mg/dL (ref 8.9–10.3)
Chloride: 100 mmol/L (ref 98–111)
Creatinine, Ser: 2.55 mg/dL — ABNORMAL HIGH (ref 0.61–1.24)
GFR, Estimated: 28 mL/min — ABNORMAL LOW (ref >60)
Glucose, Bld: 149 mg/dL — ABNORMAL HIGH (ref 70–99)
Potassium: 3.7 mmol/L (ref 3.5–5.1)
Sodium: 138 mmol/L (ref 135–145)
Total Bilirubin: 0.4 mg/dL (ref 0.3–1.2)
Total Protein: 6.9 g/dL (ref 6.5–8.1)

## 2021-10-21 LAB — TROPONIN I (HIGH SENSITIVITY)
Troponin I (High Sensitivity): 14 ng/L (ref <18)
Troponin I (High Sensitivity): 14 ng/L (ref <18)

## 2021-10-21 LAB — BRAIN NATRIURETIC PEPTIDE: B Natriuretic Peptide: 73.8 pg/mL (ref 0.0–100.0)

## 2021-10-21 MED ORDER — PREDNISONE 20 MG PO TABS: 40.0000 mg | ORAL_TABLET | Freq: Every day | ORAL | Status: DC

## 2021-10-21 MED ORDER — ATORVASTATIN CALCIUM 40 MG PO TABS
40.0000 mg | ORAL_TABLET | Freq: Every day | ORAL | Status: DC
  Administered 2021-10-22: 40 mg via ORAL
  Filled 2021-10-21: qty 1

## 2021-10-21 MED ORDER — AZITHROMYCIN 250 MG PO TABS
500.0000 mg | ORAL_TABLET | Freq: Every day | ORAL | Status: DC
  Administered 2021-10-22: 500 mg via ORAL
  Filled 2021-10-21: qty 2

## 2021-10-21 MED ORDER — METOPROLOL SUCCINATE ER 25 MG PO TB24
25.0000 mg | ORAL_TABLET | Freq: Every morning | ORAL | Status: DC
  Administered 2021-10-22: 25 mg via ORAL
  Filled 2021-10-21: qty 1

## 2021-10-21 MED ORDER — IPRATROPIUM-ALBUTEROL 0.5-2.5 (3) MG/3ML IN SOLN
3.0000 mL | RESPIRATORY_TRACT | Status: DC
  Administered 2021-10-22 (×3): 3 mL via RESPIRATORY_TRACT
  Filled 2021-10-21 (×3): qty 3

## 2021-10-21 MED ORDER — TIOTROPIUM BROMIDE MONOHYDRATE 18 MCG IN CAPS: 18.0000 ug | ORAL_CAPSULE | Freq: Every day | RESPIRATORY_TRACT | Status: DC

## 2021-10-21 MED ORDER — NICOTINE 21 MG/24HR TD PT24
21.0000 mg | MEDICATED_PATCH | Freq: Every day | TRANSDERMAL | Status: DC
  Administered 2021-10-22: 21 mg via TRANSDERMAL
  Filled 2021-10-21: qty 1

## 2021-10-21 MED ORDER — FLUTICASONE FUROATE-VILANTEROL 200-25 MCG/ACT IN AEPB: 1.0000 | INHALATION_SPRAY | Freq: Every day | RESPIRATORY_TRACT | Status: DC

## 2021-10-21 MED ORDER — RIVAROXABAN 10 MG PO TABS
10.0000 mg | ORAL_TABLET | Freq: Every day | ORAL | Status: DC
  Administered 2021-10-22: 10 mg via ORAL
  Filled 2021-10-21: qty 1

## 2021-10-21 MED ORDER — SODIUM CHLORIDE 0.9 % IV BOLUS
500.0000 mL | Freq: Once | INTRAVENOUS | Status: AC
  Administered 2021-10-22: 500 mL via INTRAVENOUS

## 2021-10-21 MED ORDER — ASPIRIN 81 MG PO TBEC
81.0000 mg | DELAYED_RELEASE_TABLET | Freq: Every day | ORAL | Status: DC
Start: 2021-10-22 — End: 2021-10-22
  Administered 2021-10-22: 81 mg via ORAL
  Filled 2021-10-21: qty 1

## 2021-10-21 MED ORDER — LACTATED RINGERS IV SOLN: INTRAVENOUS | Status: AC

## 2021-10-21 MED ORDER — IPRATROPIUM-ALBUTEROL 0.5-2.5 (3) MG/3ML IN SOLN
3.0000 mL | Freq: Once | RESPIRATORY_TRACT | Status: AC
  Administered 2021-10-21: 3 mL via RESPIRATORY_TRACT
  Filled 2021-10-21: qty 3

## 2021-10-21 NOTE — ED Provider Triage Note (Signed)
Emergency Medicine Provider Triage Evaluation Note  Dalen [place]enn[place] , a 61 y.o. male  was evaluated in triage.  Pt complains of shortness of breath, chest pain, wheezing, lightheadedness.  Symptoms have been going on for the past 5 days.  Has similar symptoms with his CHF and COPD.  States that he has been able to manage with his rescue inhaler but otherwise has been feeling poorly.  Reports coughing up mucus.  No fever.  Review of Systems  Positive: Above Negative: Fever  Physical Exam  BP (!) 116/93   Pulse 99   Temp 98.2 F (36.8 C) (Oral)   Resp 16   SpO2 (!) 89%  Gen:   Awake, no distress   Resp:  Normal effort  MSK:   Moves extremities without difficulty  Other:  Tachypneic, patient with wheezing noted to bilateral lung fields  Medical Decision Making  Medically screening exam initiated at 7:44 PM.  Appropriate orders placed.  Deegan Valentino was informed that the remainder of the evaluation will be completed by another provider, this initial triage assessment does not replace that evaluation, and the importance of remaining in the ED until their evaluation is complete.  Work-up initiated   Dietrich Pates, [place] 10/21/21 1945

## 2021-10-21 NOTE — H&P (Signed)
Date: 10/22/2021               Patient Name:  Francisco [name] MRN: [id number]  DOB: 1960/05/11 Age / Sex: 61 y.o., male   PCP: Adron Bene, MD         Medical Service: Internal Medicine Teaching Service         Attending Physician: Dr. Dickie La, MD    First Contact: Morene Crocker, MD Pager: [phone]  Second Contact: Merrilyn Puma, MD Pager: [phone]       After Hours (After 5p/  First Contact Pager: [phone]  weekends / holidays): Second Contact Pager: [phone]   SUBJECTIVE   Chief Complaint: SOB  History of Present Illness:  61 year old male with a history of COPD, tobacco use disorder, combined systolic and diastolic heart failure, methamphetamine use, CAD who presented with complaints of shortness of breath.  Patient reports worsening cough and shortness of breath which began a month ago but has worsened in the last 4-5 days.  He has noticed increased wheezing and chest tightness over the last several days, increased cough and sputum production. Sputum is mostly clear with some yellow/light [place]. Decrease exercise tolerance. Feels very tired after showering. Unable to work full day today, had to leave early. Wakes up gasping for air.  Feels vision goes black when he coughs. Now happening more frequently about 4-5 times a day. Reports pain in the back of his head associated with cough.   He wears O2 at home, 2 L, as needed at night, and states that he has been using this more lately.  He also reports that he has been using his nebulizers up to 15 times per day for symptoms, and thinks that they help, but do not entirely resolve his symptoms.      Denies LE edema or abdominal distention. Does not weigh himself. States he is still urinating the same amount. Has not noticed any changes in his urination and denies urinary symptoms. He has been compliant with is medicines. Works outside in [employer] and thinks he does not drink enough water.   No nausea vomiting, diarrhea,  weakness fever chills,    Meds:  No outpatient medications have been marked as taking for the 10/21/21 encounter [facility] Encounter).    Past Medical History:  Diagnosis Date   CHF (congestive heart failure) (HCC)    COPD (chronic obstructive pulmonary disease) (HCC)    Coronary artery disease    a. s/p prior PCI.   Hepatitis-C    Hypertension    IV drug abuse (HCC)    a. 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   Medically noncompliant    MI (myocardial infarction) (HCC)    Tobacco abuse     Past Surgical History:  Procedure Laterality Date   BACK SURGERY     NECK SURGERY     RIGHT/LEFT HEART CATH AND CORONARY ANGIOGRAPHY N/A 04/30/2019   Procedure: RIGHT/LEFT HEART CATH AND CORONARY ANGIOGRAPHY;  Surgeon: Marykay Lex, MD;  Location: [facility] INVASIVE CV LAB;  Service: Cardiovascular;  Laterality: N/A;   stints      Social:  Lives With: Roommate Occupation: Forensic psychologist  Support: none Level of  Function: Independent of ADLs PCP: Laddie Math Substances: tobacco 1/2 ppd, occasional methamphetamines   Family History: heart disease  Allergies: Allergies as of 10/21/2021 - Review Complete 10/21/2021  Allergen Reaction Noted   Ivp dye [iodinated contrast media] Other (See Comments) 06/12/2018   Tramadol Other (See Comments) 06/12/2018    Review  of Systems: A complete ROS was negative except as per HPI.   OBJECTIVE:   Physical Exam: Blood pressure (!) 116/93, pulse 99, temperature 98.2 F (36.8 C), temperature source Oral, resp. rate 16, height 6' (1.829 m), weight 88.5 kg, SpO2 (!) 89 %.  Constitutional: well-appearing male sitting in bed, in no acute distress HENT: normocephalic atraumatic, mucous membranes moist Eyes: conjunctiva non-erythematous Neck: supple Cardiovascular: regular rate and rhythm, no m/r/g, no JVD Pulmonary/Chest: breathing comfortably on 2L [place], expiratory wheeze all lung fields  Abdominal: soft, non-tender, non-distended MSK:  normal bulk and tone Neurological: alert & oriented x 3, 5/5 strength in bilateral upper and lower extremities Skin: warm and dry, no significant skin tenting Psych: mood and affect appropriate  Labs: CBC    Component Value Date/Time   WBC 9.2 10/21/2021 1949   RBC 5.10 10/21/2021 1949   HGB 16.0 10/21/2021 1949   HGB 17.5 05/18/2021 1518   HCT 48.6 10/21/2021 1949   HCT 51.9 (H) 05/18/2021 1518   PLT 242 10/21/2021 1949   PLT 316 05/18/2021 1518   MCV 95.3 10/21/2021 1949   MCV 93 05/18/2021 1518   MCH 31.4 10/21/2021 1949   MCHC 32.9 10/21/2021 1949   RDW 13.4 10/21/2021 1949   RDW 12.1 05/18/2021 1518   LYMPHSABS 2.7 10/21/2021 1949   LYMPHSABS 1.7 05/18/2021 1518   MONOABS 0.7 10/21/2021 1949   EOSABS 0.2 10/21/2021 1949   EOSABS 0.0 05/18/2021 1518   BASOSABS 0.1 10/21/2021 1949   BASOSABS 0.0 05/18/2021 1518     CMP     Component Value Date/Time   NA 138 10/21/2021 1949   NA 141 05/18/2021 1518   K 3.7 10/21/2021 1949   CL 100 10/21/2021 1949   CO2 23 10/21/2021 1949   GLUCOSE 149 (H) 10/21/2021 1949   BUN 20 10/21/2021 1949   BUN 18 05/18/2021 1518   CREATININE 2.55 (H) 10/21/2021 1949   CALCIUM 10.1 10/21/2021 1949   PROT 6.9 10/21/2021 1949   ALBUMIN 4.0 10/21/2021 1949   AST 59 (H) 10/21/2021 1949   ALT 123 (H) 10/21/2021 1949   ALKPHOS 59 10/21/2021 1949   BILITOT 0.4 10/21/2021 1949   GFRNONAA 28 (L) 10/21/2021 1949   GFRAA >60 01/11/2020 0422    Imaging: DG Chest 2 View  Result Date: 10/21/2021 CLINICAL DATA:  Chest pain and shortness of breath. EXAM: CHEST - 2 VIEW COMPARISON:  Radiograph and CT 08/12/2021 FINDINGS: The cardiomediastinal contours are normal. Coronary stent is visualized. Mild hyperinflation. Pulmonary vasculature is normal. No consolidation, pleural effusion, or pneumothorax. No acute osseous abnormalities are seen. IMPRESSION: Mild hyperinflation without acute abnormality. Electronically Signed   By: Narda Rutherford M.D.   On:  10/21/2021 20:29    EKG: personally reviewed my interpretation is sinus rhythm, peaked p waves   ASSESSMENT & PLAN:    Assessment & Plan by Problem: Active Problems:   COPD with acute exacerbation (HCC)   Tanush Drees is a 61 y.o. with pertinent PMH of COPD, CHF, tobacco use who presented with shortness of breath and admitted for COPD exacerbation and AKI on hospital day 0  # COPD exacerbation # Tobacco use disorder - No prior PFTs.  Does not follow with pulmonologist due to insurance/financial problems.  - On Spiriva, Advair Diskus, nebulizer, rescue inhalers as outpatient. Has home O2 2 L nasal cannula at night as needed. - Has had several visits to the ED for COPD exacerbation in the past year and admission on January  2023. - Patient having increased sputum production, change in sputum color, increased dyspnea and chest x-ray showing hyperinflated lungs. Wheeze heard in all lung fields. -We will plan to start the patient on Spiriva, Ellipta, DuoNebs every 4 hours, prednisone 40 mg with breakfast for 5 days, azithromycin 500 mg daily.   - Continue supplemental oxygen and wean as able - Nicotine patch  # Acute kidney injury - Creatinine 2.55, baseline around 1 - Suspect prerenal in the setting of extra sensory losses, decreased p.o. intake, continued Lasix use. - We will provide gentle hydration at 100 cc LR per hour for 5 hours given his history of CHF. - We will check UA, urine sodium, urine osms, urine creatinine - Bladder scan and renal ultrasound likely low yield at this point. -Monitor daily I's and O's  Combined systolic and diastolic congestive heart failure Coronary artery disease -Baseline weight appears to be around 200 pounds.  He is 195 today. - BNP within normal limits, no evidence of volume overload. - Aspirin 81 daily, Lipitor 40, metoprolol 25 every 24 hours - Monitor on telemetry -Monitor daily I's and O's, daily weights  Hypertension - Patient prescribed  lisinopril and amlodipine as an outpatient.  Blood pressures have been within normal range.  We will hold these for now and restart as able.   Diet: Normal VTE: NOAC IVF: LR,100cc/hr Code: Full  Prior to Admission Living Arrangement: Home, living with roommate Anticipated Discharge Location: Home Barriers to Discharge: Pending medical stability  Dispo: Admit patient to Observation with expected length of stay less than 2 midnights.  Signed: Delene Ruffini, MD Internal Medicine Resident PGY-1 10/22/2021, 12:28 AM

## 2021-10-21 NOTE — Hospital Course (Signed)
Sob New aki cr 2.55. last nml Hx chf Wheezing, but looks dry Been using prn home o2 and satting well Afebrile    7/6 States that he has had a cough for the past 4-5 days but it has been worse for the past 2 days. He does note any change in sputum color. States that he has had some coughing bouts that lead to darkening of his vision. He has been using his inhalers regularly.

## 2021-10-21 NOTE — ED Provider Notes (Cosign Needed)
[facility]EM[facility] DEPT [facility] Emergency Department Provider Note MRN:  [id number]  Arrival date & time: 10/21/21     Chief Complaint   Shortness of Breath   History of Present Illness   Francisco [name] is a 61 y.o. year-old male presents to the ED with chief complaint of cough and shortness of breath.  States that he has been feeling bad for the past 3 to 4 days.  He denies any known fever.  States that he has had some wheezing.  He has history of CHF and takes Lasix.  He has been compliant with his medications.  He has as needed home O2, which she has been using regularly for the past few days.  History provided by patient.   Review of Systems  Pertinent review of systems noted in HPI.    Physical Exam   Vitals:   10/21/21 1935 10/21/21 2242  BP: (!) 116/93 127/83  Pulse: 99 88  Resp: 16 15  Temp: 98.2 F (36.8 C)   SpO2: (!) 89% 98%    CONSTITUTIONAL:  nontoxic-appearing, NAD NEURO:  Alert and oriented x 3, CN 3-12 grossly intact EYES:  eyes equal and reactive ENT/NECK:  Supple, no stridor  CARDIO:  normal rate, regular rhythm, appears well-perfused  PULM:  No respiratory distress, wheezes bilaterally GI/GU:  non-distended,  MSK/SPINE:  No gross deformities, no edema, moves all extremities  SKIN:  no rash, atraumatic   *Additional and/or pertinent findings included in MDM below  Diagnostic and Interventional Summary    EKG Interpretation  Date/Time:  Wednesday October 21 2021 19:40:53 EDT Ventricular Rate:  99 PR Interval:  156 QRS Duration: 78 QT Interval:  334 QTC Calculation: 428 R Axis:   91 Text Interpretation: Normal sinus rhythm Right atrial enlargement Rightward axis Pulmonary disease pattern ST & T wave abnormality, consider anterolateral ischemia Abnormal ECG When compared with ECG of 12-Aug-2021 19:26, PREVIOUS ECG IS PRESENT No significant change since last tracing Confirmed by Jacalyn Lefevre [phone]) on 10/21/2021 9:54:20 PM       Labs Reviewed   COMPREHENSIVE METABOLIC PANEL - Abnormal; Notable for the following components:      Result Value   Glucose, Bld 149 (*)    Creatinine, Ser 2.55 (*)    AST 59 (*)    ALT 123 (*)    GFR, Estimated 28 (*)    All other components within normal limits  SARS CORONAVIRUS 2 BY RT PCR  BRAIN NATRIURETIC PEPTIDE  CBC WITH DIFFERENTIAL/PLATELET  TROPONIN I (HIGH SENSITIVITY)  TROPONIN I (HIGH SENSITIVITY)    DG Chest 2 View  Final Result      Medications  ipratropium-albuterol (DUONEB) 0.5-2.5 (3) MG/3ML nebulizer solution 3 mL (has no administration in time range)  sodium chloride 0.9 % bolus 500 mL (has no administration in time range)     Procedures  /  Critical Care Procedures  ED Course and Medical Decision Making  I have reviewed the triage vital signs, the nursing notes, and pertinent available records from the EMR.  Social Determinants Affecting Complexity of Care: Patient has no clinically significant social determinants affecting this chief complaint..   ED Course:   Patient here with SOB and cough.  Top differential diagnoses include CHF exacerbation, pneumonia, COVID. Medical Decision Making Patient with shortness of breath and cough for the past 3 to 4 days.  Denies fever.  He is wheezing on exam.  He does not sound wet.  Will check COVID.  Patient given nebulizer treatment  for wheezing.  Noted to have an AKI.  Will need admission.  Problems Addressed: AKI (acute kidney injury) [facility]): acute illness or injury SOB (shortness of breath): acute illness or injury Wheezing: acute illness or injury  Amount and/or Complexity of Data Reviewed Labs: ordered.    Details: Creatinine is noted to be 2.55, this is acutely elevated, last checked on 08/13/2021 and was normal, concerning for AKI Troponin and BNP are not elevated, doubt ACS, doubt CHF exacerbation Radiology: independent interpretation performed.    Details: No focal infiltrate on chest x-ray  Risk Prescription  drug management. Decision regarding hospitalization.     Consultants: I consulted with the and internal medicine residents, who are appreciated for admitting the patient.   Treatment and Plan: [name] exam and diagnostic results are concerning for AKI.  Feel that patient will need admission to the hospital for further treatment and evaluation.  Patient discussed with attending physician, Dr. Particia Nearing, who agrees with plan for admission.  Final Clinical Impressions(s) / ED Diagnoses     ICD-10-CM   1. AKI (acute kidney injury) (HCC)  N17.9     2. SOB (shortness of breath)  R06.02     3. Wheezing  R06.2       ED Discharge Orders     None         Discharge Instructions Discussed with and Provided to Patient:   Discharge Instructions   None      Filippo, Puls, PA-C 10/21/21 2256

## 2021-10-21 NOTE — ED Triage Notes (Signed)
Pt reports increasing SOB and chest pain (earlier today) that was relieved w/ 1 nitro.  Pt is currently at 90% on room air, does have PRN O2 at home.

## 2021-10-21 NOTE — H&P (Incomplete)
Date: 10/21/2021               Patient Name:  Francisco [name] MRN: [id number]  DOB: [birth year] Age / Sex: 61 y.o., male   PCP: Adron Bene, MD         Medical Service: Internal Medicine Teaching Service         Attending Physician: Dr. Jacalyn Lefevre, MD    First Contact: *** Pager: {RESIDENTNAMES2021:[id number]::"SA [phone]","ZS [phone]","ED [phone]","GG [phone]","GK [phone]","KM [phone]","SA [phone]","IB [phone]","RC [phone]","JS [phone]","SW [phone]","BC [phone]","AR [phone]","PA [phone]","PB [phone]","VK [phone]","JL [phone]","QN [phone]","MB [phone]","AD [phone]","MP [phone]","EZ [phone]"}  Second Contact: {RESIDENTNAMES2021:19197::"Shirley Ruben Im, MD","Rayann Garrel Ridgel, DO","Daija Routson Burnice Logan, MD","Ghalib Welton Flakes MD","Katie Masters, Jarvis Newcomer, MD","Iulia Huel Cote, MD","Rylee Ephriam Knuckles, MD","Jeff Barbaraann Faster, MD","Steven Sande Brothers, MD","Benjamin Lelon Frohlich, DO","Prosper Amponsah, MD","Philip Braswell, MD","Vasili Katsadouros, DO","Jassica Hancock, MD","Quan Loney Loh, MD","[facility]a [facility], MD","Monik Manley Mason, MD"} Pager: {RESIDENTNAMES2021:[id number]::"SA [phone]","FT [phone]","ED [phone]","GG [phone]","GK [phone]","KM [phone]","SA [phone]","IB [phone]","RC [phone]","[phone]","SW [phone]","[phone]","AR [phone]","PA [phone]","[phone]","VK [phone]","JL [phone]","QN [phone]","MB [phone]","AD [phone]","MP [phone]","EZ [phone]"}       After Hours (After 5p/  First Contact Pager: [phone]  weekends / holidays): Second Contact Pager: [phone]   SUBJECTIVE   Chief Complaint: SOB  History of Present Illness: *** 61 year old male with a history of COPD, tobacco use disorder, combined systolic and diastolic heart failure, methamphetamine use, CAD who presented with complaints of shortness of breath.  Patient reports worsening cough and shortness of breath which began a month ago but has worsened in the  last 4-5 days.  He has noticed increased wheezing and chest tightness over the last several days, increased cough and sputum production. Sputum is mostly clear with some yellow/light [place]. Decrease exercise tolerance. Feels very tired after showering. Unable to work full day today, had to leave early. Wakes up gasping for air.  Feels vision goes black when he coughs. Now happening more frequently about 4-5 times a day. Reports pain in the back of his head associated with cough.   He wears O2 at home, 2 L, as needed at night, and states that he has been using this more lately.  He also reports that he has been using his nebulizers up to 15 times per day for symptoms, and thinks that they help, but do not entirely resolve his symptoms.      Denies LE edema or abdominal distention. Does not weigh himself. States he is still urinating the same amount. Has not noticed any changes in his urination and denies urinary symptoms. He has been compliant with is medicines. Works outside in [employer] and thinks he does not drink enough water.   Decrease PO intake not feeling thirsty. Using lasix regularly normal urine output  Takes half a pill twice daily   Coughing some yellowish sputum No chest pain but tightness.   No dysuria, hematuria  No nausea vomiting, diarrhea, weakness fever chills,   Tired all the time  15 times a day rescue  2L O2 only before sleep for a couple of hours  ED Course:  Meds:  No outpatient medications have been marked as taking for the 10/21/21 encounter [facility] Encounter).    Past Medical History:  Diagnosis Date  . CHF (congestive heart failure) (HCC)   . COPD (chronic obstructive pulmonary disease) (HCC)   . Coronary artery disease    a. s/p prior PCI.  [place] Hepatitis-C   . Hypertension   . IV drug abuse (HCC)    a. 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  . Medically noncompliant   . MI (myocardial infarction) (  HCC)   . Tobacco abuse     Past Surgical  History:  Procedure Laterality Date  . BACK SURGERY    . NECK SURGERY    . RIGHT/LEFT HEART CATH AND CORONARY ANGIOGRAPHY N/A 04/30/2019   Procedure: RIGHT/LEFT HEART CATH AND CORONARY ANGIOGRAPHY;  Surgeon: Marykay Lex, MD;  Location: [facility] INVASIVE CV LAB;  Service: Cardiovascular;  Laterality: N/A;  . stints      Social:  Lives With:Roommate Occupation: Forensic psychologist  Support: Level of  Function:Independent of ADLs PCP: [place] Substances: Meth?   Family History: ***  Allergies: Allergies as of 10/21/2021 - Review Complete 10/21/2021  Allergen Reaction Noted  . Ivp dye [iodinated contrast media] Other (See Comments) 06/12/2018  . Tramadol Other (See Comments) 06/12/2018    Review of Systems: A complete ROS was negative except as per HPI.   OBJECTIVE:   Physical Exam: Blood pressure (!) 116/93, pulse 99, temperature 98.2 F (36.8 C), temperature source Oral, resp. rate 16, height 6' (1.829 m), weight 88.5 kg, SpO2 (!) 89 %.  Constitutional: well-appearing *** sitting in ***, in no acute distress HENT: normocephalic atraumatic, mucous membranes moist Eyes: conjunctiva non-erythematous Neck: supple Cardiovascular: regular rate and rhythm, no m/r/g Pulmonary/Chest: normal work of breathing on room air, lungs clear to auscultation bilaterally Abdominal: soft, non-tender, non-distended MSK: normal bulk and tone Neurological: alert & oriented x 3, 5/5 strength in bilateral upper and lower extremities, normal gait Skin: warm and dry Psych: ***  Labs: CBC    Component Value Date/Time   WBC 9.2 10/21/2021 1949   RBC 5.10 10/21/2021 1949   HGB 16.0 10/21/2021 1949   HGB 17.5 05/18/2021 1518   HCT 48.6 10/21/2021 1949   HCT 51.9 (H) 05/18/2021 1518   PLT 242 10/21/2021 1949   PLT 316 05/18/2021 1518   MCV 95.3 10/21/2021 1949   MCV 93 05/18/2021 1518   MCH 31.4 10/21/2021 1949   MCHC 32.9 10/21/2021 1949   RDW 13.4 10/21/2021 1949   RDW 12.1 05/18/2021 1518    LYMPHSABS 2.7 10/21/2021 1949   LYMPHSABS 1.7 05/18/2021 1518   MONOABS 0.7 10/21/2021 1949   EOSABS 0.2 10/21/2021 1949   EOSABS 0.0 05/18/2021 1518   BASOSABS 0.1 10/21/2021 1949   BASOSABS 0.0 05/18/2021 1518     CMP     Component Value Date/Time   NA 138 10/21/2021 1949   NA 141 05/18/2021 1518   K 3.7 10/21/2021 1949   CL 100 10/21/2021 1949   CO2 23 10/21/2021 1949   GLUCOSE 149 (H) 10/21/2021 1949   BUN 20 10/21/2021 1949   BUN 18 05/18/2021 1518   CREATININE 2.55 (H) 10/21/2021 1949   CALCIUM 10.1 10/21/2021 1949   PROT 6.9 10/21/2021 1949   ALBUMIN 4.0 10/21/2021 1949   AST 59 (H) 10/21/2021 1949   ALT 123 (H) 10/21/2021 1949   ALKPHOS 59 10/21/2021 1949   BILITOT 0.4 10/21/2021 1949   GFRNONAA 28 (L) 10/21/2021 1949   GFRAA >60 01/11/2020 0422    Imaging: DG Chest 2 View  Result Date: 10/21/2021 CLINICAL DATA:  Chest pain and shortness of breath. EXAM: CHEST - 2 VIEW COMPARISON:  Radiograph and CT 08/12/2021 FINDINGS: The cardiomediastinal contours are normal. Coronary stent is visualized. Mild hyperinflation. Pulmonary vasculature is normal. No consolidation, pleural effusion, or pneumothorax. No acute osseous abnormalities are seen. IMPRESSION: Mild hyperinflation without acute abnormality. Electronically Signed   By: Narda Rutherford M.D.   On: 10/21/2021 20:29    EKG:  personally reviewed my interpretation is***   ASSESSMENT & PLAN:    Assessment & Plan by Problem: Active Problems:   * No active hospital problems. *   Dee Maday is a 61 y.o. with pertinent PMH of *** who presented with *** and admitted for *** on hospital day 0  COPD-on home O2 No prior PFTs.  Does not follow with pulmonologist  Acute kidney injury Creatinine 2.55, baseline around 1  Combined systolic and diastolic congestive heart failure Coronary artery disease Methamphetamine use/tobacco use BNP within normal limits  Diet: {NAMES:3044014::"Normal","Heart  Healthy","Carb-Modified","Renal","Carb/Renal","NPO","TPN","Tube Feeds"} VTE: {NAMES:3044014::"Heparin","Enoxaparin","SCDs","NOAC","None"} IVF: {NAMES:3044014::"None","NS","1/2 NS","LR","D5","D10"},{NAMES:3044014::"None","10cc/hr","25cc/hr","50cc/hr","75cc/hr","100cc/hr","110cc/hr","125cc/hr","Bolus"} Code: {NAMES:3044014::"Full","DNR","DNI","DNR/DNI","Comfort Care","Unknown"}  Prior to Admission Living Arrangement: {NAMES:3044014::"Home, living ***","SNF, ***","Homeless","***"} Anticipated Discharge Location: {NAMES:3044014::"Home","SNF","CIR","***"} Barriers to Discharge: ***  Dispo: Admit patient to {STATUS:3044014::"Observation with expected length of stay less than 2 midnights.","Inpatient with expected length of stay greater than 2 midnights."}  Signed: Adron Bene, MD Internal Medicine Resident PGY-1 10/21/2021, 10:38 PM

## 2021-10-22 ENCOUNTER — Observation Stay (HOSPITAL_COMMUNITY): Payer: Self-pay

## 2021-10-22 DIAGNOSIS — F17210 Nicotine dependence, cigarettes, uncomplicated: Secondary | ICD-10-CM

## 2021-10-22 DIAGNOSIS — J441 Chronic obstructive pulmonary disease with (acute) exacerbation: Secondary | ICD-10-CM

## 2021-10-22 LAB — CBC
HCT: 45.1 % (ref 39.0–52.0)
Hemoglobin: 15.3 g/dL (ref 13.0–17.0)
MCH: 32.3 pg (ref 26.0–34.0)
MCHC: 33.9 g/dL (ref 30.0–36.0)
MCV: 95.3 fL (ref 80.0–100.0)
Platelets: 212 10*3/uL (ref 150–400)
RBC: 4.73 MIL/uL (ref 4.22–5.81)
RDW: 13.4 % (ref 11.5–15.5)
WBC: 7.9 10*3/uL (ref 4.0–10.5)
nRBC: 0 % (ref 0.0–0.2)

## 2021-10-22 LAB — BASIC METABOLIC PANEL
Anion gap: 13 (ref 5–15)
BUN: 23 mg/dL — ABNORMAL HIGH (ref 6–20)
CO2: 25 mmol/L (ref 22–32)
Calcium: 9.2 mg/dL (ref 8.9–10.3)
Chloride: 102 mmol/L (ref 98–111)
Creatinine, Ser: 2.16 mg/dL — ABNORMAL HIGH (ref 0.61–1.24)
GFR, Estimated: 34 mL/min — ABNORMAL LOW (ref >60)
Glucose, Bld: 149 mg/dL — ABNORMAL HIGH (ref 70–99)
Potassium: 3.5 mmol/L (ref 3.5–5.1)
Sodium: 140 mmol/L (ref 135–145)

## 2021-10-22 LAB — URINALYSIS, COMPLETE (UACMP) WITH MICROSCOPIC
Bilirubin Urine: NEGATIVE
Glucose, UA: NEGATIVE mg/dL
Hgb urine dipstick: NEGATIVE
Ketones, ur: NEGATIVE mg/dL
Leukocytes,Ua: NEGATIVE
Nitrite: NEGATIVE
Protein, ur: 100 mg/dL — AB
Specific Gravity, Urine: 1.026 (ref 1.005–1.030)
pH: 5 (ref 5.0–8.0)

## 2021-10-22 LAB — OSMOLALITY, URINE: Osmolality, Ur: 633 mOsm/kg (ref 300–900)

## 2021-10-22 LAB — SARS CORONAVIRUS 2 BY RT PCR: SARS Coronavirus 2 by RT PCR: NEGATIVE

## 2021-10-22 LAB — SODIUM, URINE, RANDOM: Sodium, Ur: 62 mmol/L

## 2021-10-22 LAB — CREATININE, URINE, RANDOM: Creatinine, Urine: 484.63 mg/dL

## 2021-10-22 NOTE — ED Notes (Signed)
Coughing prior to the hhn

## 2021-10-22 NOTE — ED Notes (Signed)
Pt was inform of the risks and benefits of leaving AMA. Patient verbalized understanding that risks and death can occur.   Provider was notified and spoke to the patient in the lobby. Pt still verbalized that he wanted to leave. AMA papers signed.

## 2021-10-22 NOTE — Progress Notes (Signed)
Received page from RN stating that patient leaving AMA. Went to ED to discuss and patient was sitting in ED lobby. States that he received a phone call about his father in [place] and his health is "not looking good" and thus he needs to go see him. We discussed the risks of leaving AMA with his current COPD exacerbation and AKI (slightly improved from yesterday). He understands the risks of leaving AMA, including worsening respiratory and/or renal status or even possibly death. He confirmed understanding and has already signed AMA papers. We discussed using his home inhalers and increasing PO intake at home. We discussed return precautions. He states if he is not feeling well, he will return to the ED later on tonight or tomorrow.

## 2021-10-24 ENCOUNTER — Encounter (HOSPITAL_COMMUNITY): Payer: Self-pay | Admitting: Emergency Medicine

## 2021-10-24 ENCOUNTER — Emergency Department (HOSPITAL_COMMUNITY)
Admission: EM | Admit: 2021-10-24 | Discharge: 2021-10-25 | Disposition: A | Discharge status: Home / Self Care | Payer: Self-pay | Attending: Student | Admitting: Student

## 2021-10-24 ENCOUNTER — Other Ambulatory Visit: Payer: Self-pay

## 2021-10-24 ENCOUNTER — Emergency Department (HOSPITAL_COMMUNITY): Payer: Self-pay

## 2021-10-24 DIAGNOSIS — Z955 Presence of coronary angioplasty implant and graft: Secondary | ICD-10-CM | POA: Insufficient documentation

## 2021-10-24 DIAGNOSIS — Z7951 Long term (current) use of inhaled steroids: Secondary | ICD-10-CM | POA: Insufficient documentation

## 2021-10-24 DIAGNOSIS — I5040 Unspecified combined systolic (congestive) and diastolic (congestive) heart failure: Secondary | ICD-10-CM | POA: Insufficient documentation

## 2021-10-24 DIAGNOSIS — J441 Chronic obstructive pulmonary disease with (acute) exacerbation: Secondary | ICD-10-CM | POA: Insufficient documentation

## 2021-10-24 DIAGNOSIS — I509 Heart failure, unspecified: Secondary | ICD-10-CM | POA: Insufficient documentation

## 2021-10-24 DIAGNOSIS — Z7982 Long term (current) use of aspirin: Secondary | ICD-10-CM | POA: Insufficient documentation

## 2021-10-24 DIAGNOSIS — Z79899 Other long term (current) drug therapy: Secondary | ICD-10-CM | POA: Insufficient documentation

## 2021-10-24 DIAGNOSIS — I110 Hypertensive heart disease with heart failure: Secondary | ICD-10-CM | POA: Insufficient documentation

## 2021-10-24 DIAGNOSIS — F17210 Nicotine dependence, cigarettes, uncomplicated: Secondary | ICD-10-CM | POA: Insufficient documentation

## 2021-10-24 DIAGNOSIS — I2510 Atherosclerotic heart disease of native coronary artery without angina pectoris: Secondary | ICD-10-CM | POA: Insufficient documentation

## 2021-10-24 DIAGNOSIS — Z20822 Contact with and (suspected) exposure to covid-19: Secondary | ICD-10-CM | POA: Insufficient documentation

## 2021-10-24 LAB — I-STAT VENOUS BLOOD GAS, ED
Acid-Base Excess: 6 mmol/L — ABNORMAL HIGH (ref 0.0–2.0)
Bicarbonate: 32.2 mmol/L — ABNORMAL HIGH (ref 20.0–28.0)
Calcium, Ion: 1.23 mmol/L (ref 1.15–1.40)
HCT: 46 % (ref 39.0–52.0)
Hemoglobin: 15.6 g/dL (ref 13.0–17.0)
O2 Saturation: 51 %
Potassium: 3.4 mmol/L — ABNORMAL LOW (ref 3.5–5.1)
Sodium: 142 mmol/L (ref 135–145)
TCO2: 34 mmol/L — ABNORMAL HIGH (ref 22–32)
pCO2, Ven: 53 mmHg (ref 44–60)
pH, Ven: 7.392 (ref 7.25–7.43)
pO2, Ven: 28 mmHg — CL (ref 32–45)

## 2021-10-24 LAB — CBC WITH DIFFERENTIAL/PLATELET
Abs Immature Granulocytes: 0.03 10*3/uL (ref 0.00–0.07)
Basophils Absolute: 0.1 10*3/uL (ref 0.0–0.1)
Basophils Relative: 1 %
Eosinophils Absolute: 0.1 10*3/uL (ref 0.0–0.5)
Eosinophils Relative: 2 %
HCT: 44.2 % (ref 39.0–52.0)
Hemoglobin: 15 g/dL (ref 13.0–17.0)
Immature Granulocytes: 0 %
Lymphocytes Relative: 26 %
Lymphs Abs: 1.9 10*3/uL (ref 0.7–4.0)
MCH: 31.3 pg (ref 26.0–34.0)
MCHC: 33.9 g/dL (ref 30.0–36.0)
MCV: 92.3 fL (ref 80.0–100.0)
Monocytes Absolute: 0.6 10*3/uL (ref 0.1–1.0)
Monocytes Relative: 8 %
Neutro Abs: 4.6 10*3/uL (ref 1.7–7.7)
Neutrophils Relative %: 63 %
Platelets: 226 10*3/uL (ref 150–400)
RBC: 4.79 MIL/uL (ref 4.22–5.81)
RDW: 13.1 % (ref 11.5–15.5)
WBC: 7.4 10*3/uL (ref 4.0–10.5)
nRBC: 0 % (ref 0.0–0.2)

## 2021-10-24 LAB — BASIC METABOLIC PANEL
Anion gap: 11 (ref 5–15)
BUN: 11 mg/dL (ref 6–20)
CO2: 25 mmol/L (ref 22–32)
Calcium: 9.4 mg/dL (ref 8.9–10.3)
Chloride: 102 mmol/L (ref 98–111)
Creatinine, Ser: 1.07 mg/dL (ref 0.61–1.24)
GFR, Estimated: >60 mL/min (ref >60)
Glucose, Bld: 145 mg/dL — ABNORMAL HIGH (ref 70–99)
Potassium: 3.3 mmol/L — ABNORMAL LOW (ref 3.5–5.1)
Sodium: 138 mmol/L (ref 135–145)

## 2021-10-24 LAB — BRAIN NATRIURETIC PEPTIDE: B Natriuretic Peptide: 130.8 pg/mL — ABNORMAL HIGH (ref 0.0–100.0)

## 2021-10-24 MED ORDER — METHYLPREDNISOLONE SODIUM SUCC 125 MG IJ SOLR
125.0000 mg | Freq: Once | INTRAMUSCULAR | Status: AC
Start: 2021-10-24 — End: 2021-10-24
  Administered 2021-10-24: 125 mg via INTRAVENOUS
  Filled 2021-10-24: qty 2

## 2021-10-24 MED ORDER — IPRATROPIUM-ALBUTEROL 0.5-2.5 (3) MG/3ML IN SOLN
9.0000 mL | Freq: Once | RESPIRATORY_TRACT | Status: AC
  Administered 2021-10-24: 9 mL via RESPIRATORY_TRACT
  Filled 2021-10-24: qty 9

## 2021-10-24 NOTE — ED Provider Notes (Signed)
[facility] EMERGENCY DEPARTMENT Provider Note  CSN: [id number] Arrival date & time: 10/24/21 2023  Chief Complaint(s) SOB / Chest Tightness (COPD / CHF )  HPI Terelle Dobler is a 61 y.o. male with PMH COPD, CHF, CAD status post PCI, IV drug use with methamphetamine who presents emergency department for evaluation of shortness of breath and chest tightness.  Patient was previously admitted for AKI, left AMA now returns back to the emergency department.  He states has been using his home inhalers without improvement.  Patient arrives with minimal respiratory distress but persistent wheezing.  Currently denies chest pain, abdominal pain, nausea, vomiting or other systemic symptoms.   Past Medical History Past Medical History:  Diagnosis Date   CHF (congestive heart failure) (HCC)    COPD (chronic obstructive pulmonary disease) (HCC)    Coronary artery disease    a. s/p prior PCI.   Hepatitis-C    Hypertension    IV drug abuse (HCC)    a. 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   Medically noncompliant    MI (myocardial infarction) (HCC)    Tobacco abuse    Patient Active Problem List   Diagnosis Date Noted   COPD with acute exacerbation (HCC) 10/21/2021   Combined systolic and diastolic congestive heart failure (HCC) 08/13/2021   Cellulitis of right wrist 08/13/2021   Hypercalcemia 06/24/2021   Lung nodules 06/24/2021   Hepatitis C 06/24/2021   Lumbar back pain 06/24/2021   Anxiety 05/01/2021   Insurance coverage problems 04/27/2021   Hypertension 04/26/2021   Tobacco abuse 01/16/2021   Methamphetamine use (HCC) 08/17/2019   COPD exacerbation (HCC)    Dilated cardiomyopathy (HCC)    CAD S/P percutaneous coronary angioplasty    Home Medication(s) Prior to Admission medications   Medication Sig Start Date End Date Taking? Authorizing Provider  acetaminophen (TYLENOL) 325 MG tablet Take 650 mg by mouth every 6 (six) hours as needed for moderate pain  or headache.    [provider]  albuterol (PROVENTIL) (2.5 MG/3ML) 0.083% nebulizer solution Use 1 vial (2.5 mg total) by nebulization every 6 (six) hours as needed for wheezing or shortness of breath. 06/24/21   Adron Bene, MD  albuterol (VENTOLIN HFA) 108 (90 Base) MCG/ACT inhaler Inhale 2 puffs into the lungs every 6 (six) hours as needed for wheezing or shortness of breath 09/25/21   Steffanie Rainwater, MD  albuterol (VENTOLIN HFA) 108 (90 Base) MCG/ACT inhaler Inhale 2 puffs into the lungs every 6 (six) hours as needed for wheezing or shortness of breath 10/19/21   Masters, Florentina Addison, DO  amLODipine (NORVASC) 5 MG tablet Take 1 tablet (5 mg total) by mouth daily. 09/21/21   Merrilyn Puma, MD  aspirin EC 81 MG tablet Take 81 mg by mouth daily. Swallow whole.    [provider]  atorvastatin (LIPITOR) 40 MG tablet Take 1 tablet (40 mg total) by mouth daily. 09/21/21   Merrilyn Puma, MD  fluticasone-salmeterol (ADVAIR DISKUS) 250-50 MCG/ACT AEPB Inhale 1 puff into the lungs in the morning and at bedtime. 07/01/21 10/26/21  Adron Bene, MD  furosemide (LASIX) 40 MG tablet Take 1 tablet (40 mg total) by mouth daily. 08/28/21   Verdene Lennert, MD  lisinopril (ZESTRIL) 20 MG tablet Take 1 tablet (20 mg total) by mouth daily. 09/21/21   Merrilyn Puma, MD  metoprolol succinate (TOPROL-XL) 25 MG 24 hr tablet Take 1 tablet (25 mg total) by mouth in the morning. 09/21/21   Merrilyn Puma, MD  nicotine (NICODERM CQ - DOSED IN MG/24 HOURS) 14 mg/24hr patch Place 1 patch (14 mg total) onto the skin daily. Patient not taking: Reported on 08/13/2021 06/24/21   Adron Bene, MD  nitroGLYCERIN (NITROSTAT) 0.4 MG SL tablet Place 1 tablet (0.4 mg total) under the tongue every 5 (five) minutes x 3 doses as needed for chest pain. 04/21/21   Masters, Katie, DO  tiotropium (SPIRIVA) 18 MCG inhalation capsule Place 1 capsule (18 mcg total) into inhaler and inhale daily. 04/23/21   Adron Bene, MD                                                                                                                                     Past Surgical History Past Surgical History:  Procedure Laterality Date   BACK SURGERY     NECK SURGERY     RIGHT/LEFT HEART CATH AND CORONARY ANGIOGRAPHY N/A 04/30/2019   Procedure: RIGHT/LEFT HEART CATH AND CORONARY ANGIOGRAPHY;  Surgeon: Marykay Lex, MD;  Location: [facility] INVASIVE CV LAB;  Service: Cardiovascular;  Laterality: N/A;   stints     Family History Family History  Problem Relation Age of Onset   Rheum arthritis Mother    Heart disease Paternal Grandfather     Social History Social History   Tobacco Use   Smoking status: Every Day    Packs/day: 0.50    Types: Cigarettes   Smokeless tobacco: Never  Vaping Use   Vaping Use: Never used  Substance Use Topics   Alcohol use: Never   Drug use: Yes    Types: Methamphetamines    Comment: heroin   Allergies Ivp dye [iodinated contrast media] and Tramadol  Review of Systems Review of Systems  Respiratory:  Positive for cough, chest tightness, shortness of breath and wheezing.     Physical Exam Vital Signs  I have reviewed the triage vital signs BP 129/88   Pulse 88   Temp 98.2 F (36.8 C) (Oral)   Resp 12   SpO2 94%   Physical Exam Constitutional:      General: He is not in acute distress.    Appearance: Normal appearance.  HENT:     Head: Normocephalic and atraumatic.     Nose: No congestion or rhinorrhea.  Eyes:     General:        Right eye: No discharge.        Left eye: No discharge.     Extraocular Movements: Extraocular movements intact.     Pupils: Pupils are equal, round, and reactive to light.  Cardiovascular:     Rate and Rhythm: Normal rate and regular rhythm.     Heart sounds: No murmur heard. Pulmonary:     Effort: No respiratory distress.     Breath sounds: Wheezing present. No rales.  Abdominal:     General: There is no distension.     Tenderness: There  is no  abdominal tenderness.  Musculoskeletal:        General: Normal range of motion.     Cervical back: Normal range of motion.  Skin:    General: Skin is warm and dry.  Neurological:     General: No focal deficit present.     Mental Status: He is alert.     ED Results and Treatments Labs (all labs ordered are listed, but only abnormal results are displayed) Labs Reviewed  BASIC METABOLIC PANEL - Abnormal; Notable for the following components:      Result Value   Potassium 3.3 (*)    Glucose, Bld 145 (*)    All other components within normal limits  BRAIN NATRIURETIC PEPTIDE - Abnormal; Notable for the following components:   B Natriuretic Peptide 130.8 (*)    All other components within normal limits  I-STAT VENOUS BLOOD GAS, ED - Abnormal; Notable for the following components:   pO2, Ven 28 (*)    Bicarbonate 32.2 (*)    TCO2 34 (*)    Acid-Base Excess 6.0 (*)    Potassium 3.4 (*)    All other components within normal limits  RESP PANEL BY RT-PCR (FLU A&B, COVID) ARPGX2  CULTURE, BLOOD (ROUTINE X 2)  CULTURE, BLOOD (ROUTINE X 2)  CBC WITH DIFFERENTIAL/PLATELET                                                                                                                          Radiology DG Chest 2 View  Result Date: 10/24/2021 CLINICAL DATA:  Shortness of breath EXAM: CHEST - 2 VIEW COMPARISON:  10/21/2021 FINDINGS: Cardiac shadow is within normal limits. The lungs are hyperinflated. No focal infiltrate or sizable effusion is seen. No bony abnormality is noted. IMPRESSION: Lungs are hyperinflated but stable. Electronically Signed   By: Alcide Clever M.D.   On: 10/24/2021 21:45    Pertinent labs & imaging results that were available during my care of the patient were reviewed by me and considered in my medical decision making (see MDM for details).  Medications Ordered in ED Medications  ipratropium-albuterol (DUONEB) 0.5-2.5 (3) MG/3ML nebulizer solution 9 mL (9 mLs  Nebulization Given 10/24/21 2233)  methylPREDNISolone sodium succinate (SOLU-MEDROL) 125 mg/2 mL injection 125 mg (125 mg Intravenous Given 10/24/21 2312)  Procedures Procedures  (including critical care time)  Medical Decision Making / ED Course   This patient presents to the ED for concern of shortness of breath, this involves an extensive number of treatment options, and is a complaint that carries with it a high risk of complications and morbidity.  The differential diagnosis includes COPD exacerbation, CHF exacerbation, endocarditis, ACS  MDM: Patient seen emergency room for evaluation of shortness of breath and wheezing.  Physical exam reveals minimal accessory muscle use but persistent wheezing bilaterally.  Laboratory evaluation with mild hypokalemia to 3.3 and the patient's AKI appears to have resolved.  Chest x-ray unremarkable.  Patient given 3 DuoNebs and methylprednisolone and patient signed out to oncoming provider.  Please see provider signout for continuation of work-up.  Disposition pending.   Additional history obtained:  -External records from outside source obtained and reviewed including: Chart review including previous notes, labs, imaging, consultation notes   Lab Tests: -I ordered, reviewed, and interpreted labs.   The pertinent results include:   Labs Reviewed  BASIC METABOLIC PANEL - Abnormal; Notable for the following components:      Result Value   Potassium 3.3 (*)    Glucose, Bld 145 (*)    All other components within normal limits  BRAIN NATRIURETIC PEPTIDE - Abnormal; Notable for the following components:   B Natriuretic Peptide 130.8 (*)    All other components within normal limits  I-STAT VENOUS BLOOD GAS, ED - Abnormal; Notable for the following components:   pO2, Ven 28 (*)    Bicarbonate 32.2 (*)    TCO2 34 (*)     Acid-Base Excess 6.0 (*)    Potassium 3.4 (*)    All other components within normal limits  RESP PANEL BY RT-PCR (FLU A&B, COVID) ARPGX2  CULTURE, BLOOD (ROUTINE X 2)  CULTURE, BLOOD (ROUTINE X 2)  CBC WITH DIFFERENTIAL/PLATELET      EKG   EKG Interpretation  Date/Time:  Saturday October 24 2021 22:05:40 EDT Ventricular Rate:  93 PR Interval:  163 QRS Duration: 101 QT Interval:  322 QTC Calculation: 401 R Axis:   -8 Text Interpretation: Sinus rhythm Consider right atrial enlargement Borderline repolarization abnormality Confirmed by Ross Marcus ([id number]) on 10/24/2021 11:18:04 PM         Imaging Studies ordered: I ordered imaging studies including CXR I independently visualized and interpreted imaging. I agree with the radiologist interpretation   Medicines ordered and prescription drug management: Meds ordered this encounter  Medications   ipratropium-albuterol (DUONEB) 0.5-2.5 (3) MG/3ML nebulizer solution 9 mL   methylPREDNISolone sodium succinate (SOLU-MEDROL) 125 mg/2 mL injection 125 mg    -I have reviewed the patients home medicines and have made adjustments as needed  Critical interventions none  Cardiac Monitoring: The patient was maintained on a cardiac monitor.  I personally viewed and interpreted the cardiac monitored which showed an underlying rhythm of: NSR  Social Determinants of Health:  Factors impacting patients care include: IV drug use   Reevaluation: After the interventions noted above, I reevaluated the patient and found that they have :improved  Co morbidities that complicate the patient evaluation  Past Medical History:  Diagnosis Date   CHF (congestive heart failure) (HCC)    COPD (chronic obstructive pulmonary disease) (HCC)    Coronary artery disease    a. s/p prior PCI.   Hepatitis-C    Hypertension    IV drug abuse (HCC)    a. 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   Medically  noncompliant    MI (myocardial  infarction) (HCC)    Tobacco abuse       Dispostion: I considered admission for this patient, and disposition pending reevaluation by oncoming provider.  Please see provider signout for continuation of work-up.     Final Clinical Impression(s) / ED Diagnoses Final diagnoses:  None     @PCDICTATION @    , MD 10/24/21 [phone]

## 2021-10-24 NOTE — ED Notes (Signed)
Patient refused Covid test at triage .

## 2021-10-24 NOTE — ED Provider Triage Note (Signed)
Emergency Medicine Provider Triage Evaluation Note  Francisco [name] , a 61 y.o. male  was evaluated in triage.  Pt complains of shortness of breath.  Ongoing for several days, worsening.  He has a history of CHF, COPD, using his nebulizer and inhaler at home, history of MI, no peripheral edema.  Uses methamphetamine and had some yesterday.  Denies chest pain.  Review of Systems  Positive: Shortness of breath Negative: Volume overload  Physical Exam  BP (!) 161/97 (BP Location: Right Arm)   Pulse (!) 103   Temp 98.2 F (36.8 C) (Oral)   Resp 18   SpO2 91%  Gen:   Awake, no distress   Resp:  Diffuse expiratory wheezing, prolonged expiratory phase MSK:   Moves extremities without difficulty  Other:  No peripheral edema  Medical Decision Making  Medically screening exam initiated at 8:58 PM.  Appropriate orders placed.  [name] was informed that the remainder of the evaluation will be completed by another provider, this initial triage assessment does not replace that evaluation, and the importance of remaining in the ED until their evaluation is complete.  Patient here with borderline hypoxia, increased work of breathing, prolonged expiratory phase.  I suspect COPD exacerbation.  Work-up initiated.   Arthor Captain, PA-C 10/24/21 2059

## 2021-10-24 NOTE — ED Triage Notes (Addendum)
Patient reports worsening SOB with chest tightness and productive cough onset yesterday unrelieved by nebulizer/inhaler . Denies chest pain . Patient stated Methamphetamine use yesterday . History of COPD/ CHF / CAD.

## 2021-10-25 ENCOUNTER — Encounter (HOSPITAL_COMMUNITY): Payer: Self-pay | Admitting: Emergency Medicine

## 2021-10-25 LAB — RESP PANEL BY RT-PCR (FLU A&B, COVID) ARPGX2
Influenza A by PCR: NEGATIVE
Influenza B by PCR: NEGATIVE
SARS Coronavirus 2 by RT PCR: NEGATIVE

## 2021-10-25 MED ORDER — PREDNISONE 20 MG PO TABS: 40.0000 mg | ORAL_TABLET | Freq: Every day | ORAL | 0 refills | Status: DC

## 2021-10-25 MED ORDER — ALBUTEROL SULFATE HFA 108 (90 BASE) MCG/ACT IN AERS
6.0000 | INHALATION_SPRAY | Freq: Once | RESPIRATORY_TRACT | Status: AC
  Administered 2021-10-25: 6 via RESPIRATORY_TRACT
  Filled 2021-10-25: qty 6.7

## 2021-10-25 MED ORDER — IPRATROPIUM-ALBUTEROL 0.5-2.5 (3) MG/3ML IN SOLN
3.0000 mL | Freq: Once | RESPIRATORY_TRACT | Status: AC
  Administered 2021-10-25: 3 mL via RESPIRATORY_TRACT
  Filled 2021-10-25: qty 3

## 2021-10-25 NOTE — Discharge Instructions (Addendum)
You were seen today for shortness of breath.  You have some wheezing and likely a COPD exacerbation.  Use your inhaler every 4 hours as needed.  Make sure to take steroids.  Additionally, if you are smoking, you should stop.

## 2021-10-25 NOTE — ED Provider Notes (Signed)
Patient signed out pending repeat assessment for likely COPD exacerbation.  In brief recent admission for AKI which has since resolved.  Presented with shortness of breath.  Presentation consistent with COPD.  Received DuoNeb.  On reassessment, had some continued wheezing and complaints of shortness of breath.  Repeat DuoNeb ordered.  Patient ultimately was able to ambulate and maintain his pulse ox greater than 90%.  No respiratory distress.  Did report subjective shortness of breath; however, did not appear in any respiratory distress.  He was given an inhaler.  Will discharge with prednisone.  He was advised that he should stop smoking.   Physical Exam  BP (!) 161/87   Pulse 93   Temp 98.2 F (36.8 C) (Oral)   Resp (!) 22   Ht 1.829 m (6')   Wt 88.5 kg   SpO2 93%   BMI 26.45 kg/m     Procedures  Procedures  ED Course / MDM   Clinical Course as of 10/25/21 0252  Sun Oct 25, 2021  0247 On repeat evaluation, patient has some persistent wheeze but has fair air movement.  He is in no respiratory distress.  He ambulated to maintain his pulse ox greater than 90%.  He reports ongoing shortness of breath.  We will order an inhaler.  Feel he can likely be discharged home given objective exam findings.  Will discharge with prednisone. [CH]    Clinical Course User Index [CH] Yosselyn Tax, Mayer Masker, MD   Medical Decision Making Risk Prescription drug management.   Problem List Items Addressed This Visit   None Visit Diagnoses     Chronic obstructive pulmonary disease with acute exacerbation (HCC)    -  Primary   Relevant Medications   ipratropium-albuterol (DUONEB) 0.5-2.5 (3) MG/3ML nebulizer solution 9 mL (Completed)   methylPREDNISolone sodium succinate (SOLU-MEDROL) 125 mg/2 mL injection 125 mg (Completed)   ipratropium-albuterol (DUONEB) 0.5-2.5 (3) MG/3ML nebulizer solution 3 mL (Completed)   albuterol (VENTOLIN HFA) 108 (90 Base) MCG/ACT inhaler 6 puff (Start on 10/25/2021  3:00 AM)    predniSONE (DELTASONE) 20 MG tablet             Shon Baton, MD 10/25/21 [phone]

## 2021-10-25 NOTE — ED Notes (Signed)
Patient verbalizes understanding of discharge instructions. Opportunity for questioning and answers were provided. Armband removed by staff, pt discharged from ED. Pt taken to ED waiting room via wheel chair.  

## 2021-10-25 NOTE — ED Notes (Signed)
Pt ambulated in room, O2 90% while ambulating, improved to 93% with deep breathing. Pt c/o ShOB while ambulating.

## 2021-10-26 ENCOUNTER — Other Ambulatory Visit: Payer: Self-pay | Admitting: Internal Medicine

## 2021-10-26 ENCOUNTER — Other Ambulatory Visit (HOSPITAL_COMMUNITY): Payer: Self-pay

## 2021-10-28 ENCOUNTER — Other Ambulatory Visit (HOSPITAL_COMMUNITY): Payer: Self-pay

## 2021-10-29 ENCOUNTER — Other Ambulatory Visit (HOSPITAL_COMMUNITY): Payer: Self-pay

## 2021-10-29 ENCOUNTER — Other Ambulatory Visit: Payer: Self-pay | Admitting: Internal Medicine

## 2021-10-29 MED ORDER — FLUTICASONE-SALMETEROL 250-50 MCG/ACT IN AEPB
1.0000 | INHALATION_SPRAY | Freq: Two times a day (BID) | RESPIRATORY_TRACT | 2 refills | Status: DC
  Filled 2021-10-29: qty 60, 30d supply, fill #0

## 2021-10-29 MED ORDER — ALBUTEROL SULFATE (2.5 MG/3ML) 0.083% IN NEBU
2.5000 mg | INHALATION_SOLUTION | Freq: Four times a day (QID) | RESPIRATORY_TRACT | 2 refills | Status: DC | PRN
  Filled 2021-10-29: qty 300, 25d supply, fill #0
  Filled 2021-11-10: qty 75, 7d supply, fill #0
  Filled 2021-11-20: qty 75, 7d supply, fill #1
  Filled 2021-12-08: qty 75, 7d supply, fill #2
  Filled 2021-12-22: qty 75, 7d supply, fill #3
  Filled 2022-01-12: qty 75, 7d supply, fill #4
  Filled 2022-01-26: qty 75, 7d supply, fill #5
  Filled 2022-02-12: qty 75, 7d supply, fill #6
  Filled 2022-03-23: qty 75, 7d supply, fill #7
  Filled 2022-04-07: qty 75, 7d supply, fill #8

## 2021-10-30 ENCOUNTER — Other Ambulatory Visit (HOSPITAL_COMMUNITY): Payer: Self-pay

## 2021-10-30 LAB — CULTURE, BLOOD (ROUTINE X 2)
Culture: NO GROWTH
Culture: NO GROWTH
Special Requests: ADEQUATE
Special Requests: ADEQUATE

## 2021-11-02 ENCOUNTER — Encounter: Payer: Self-pay | Admitting: Student

## 2021-11-06 ENCOUNTER — Other Ambulatory Visit (HOSPITAL_COMMUNITY): Payer: Self-pay

## 2021-11-09 ENCOUNTER — Other Ambulatory Visit (HOSPITAL_COMMUNITY): Payer: Self-pay

## 2021-11-09 ENCOUNTER — Encounter: Payer: Self-pay | Admitting: Student

## 2021-11-10 ENCOUNTER — Other Ambulatory Visit: Payer: Self-pay

## 2021-11-10 ENCOUNTER — Encounter: Payer: Self-pay | Admitting: Student

## 2021-11-10 ENCOUNTER — Ambulatory Visit (INDEPENDENT_AMBULATORY_CARE_PROVIDER_SITE_OTHER): Payer: Self-pay | Admitting: Student

## 2021-11-10 ENCOUNTER — Other Ambulatory Visit (HOSPITAL_COMMUNITY): Payer: Self-pay

## 2021-11-10 VITALS — BP 124/83 | HR 91 | Temp 97.7°F | Ht 72.0 in | Wt 193.3 lb

## 2021-11-10 DIAGNOSIS — J441 Chronic obstructive pulmonary disease with (acute) exacerbation: Secondary | ICD-10-CM

## 2021-11-10 DIAGNOSIS — Z5989 Other problems related to housing and economic circumstances: Secondary | ICD-10-CM

## 2021-11-10 DIAGNOSIS — F17210 Nicotine dependence, cigarettes, uncomplicated: Secondary | ICD-10-CM

## 2021-11-10 MED ORDER — FLUTICASONE-SALMETEROL 500-50 MCG/ACT IN AEPB
1.0000 | INHALATION_SPRAY | Freq: Two times a day (BID) | RESPIRATORY_TRACT | 6 refills | Status: DC
  Filled 2021-11-10: qty 60, 30d supply, fill #0

## 2021-11-10 NOTE — Patient Instructions (Signed)
Thank you so much for coming into the clinic today!  It was a pleasure meeting him.  We talked about a couple things today here is just a quick summary.  1.  We are going to increase your Advair, calling it into the pharmacy right now. 2.  I am also putting in a referral to our social worker, who can help you with the orange card as soon as you are able to get it. 3.  The best thing you can do for yourself is quitting smoking, so please work on that if you need any help whatsoever thoughts or here for.  If you have any questions, please call the clinic at is [phone].  Best, Dr. Jason Fila Audreena Sachdeva

## 2021-11-10 NOTE — Assessment & Plan Note (Signed)
>>  ASSESSMENT AND PLAN FOR COPD (CHRONIC OBSTRUCTIVE PULMONARY DISEASE) (HCC) WRITTEN ON 11/10/2021  6:47 PM BY NOORUDDIN, SAAD, MD  Patient is here for follow-up from the emergency department on October 24, 2021.  He presented to the ED with shortness of breath and chest tightening, and x-ray showed hyperinflated lungs.  He was given methylprednisone and 3 DuoNebs.  He was unable to get the DuoNebs due to their cost.  He states that he is doing much better.  He is currently on albuterol  nebulizer, albuterol  inhaler, Advair , and Spiriva .  His concern today, is that his shortness of breath is getting much more frequent. Within the past month he says he has about 20 episodes of SOB a day, and before then he stated he was having around 10.  He is still smoking half a pack of cigarettes every single day.  He is currently saturating room air at 94%.  On physical exam, wheezes were heard in the lungs bilaterally, and no fluid was heard.  Plan:  - In order to combat the more frequent shortness of breath episodes, we have increased his Advair  to 500-50, 2 puffs a day.  - Patient is in the process of getting his orange card, and states that he could be getting it within the next week or so.  I placed a referral to our social worker in the clinic to help him with this process.  If orange card is obtained soon, I will place referral to see a pulmonologist to undergo respiratory therapy.  - I counseled patient on smoking cessation, and explained to him that that is the best way to reduce his symptoms.  Patient was understanding, and will try to reduce his cigarette usage.

## 2021-11-10 NOTE — Progress Notes (Signed)
CC: COPD Exacerbation F/U  HPI:  Francisco [name] is a 61 y.o. male living with a history stated below and presents today for follow up for his COPD exacerbation. Please see problem based assessment and plan for additional details.  Past Medical History:  Diagnosis Date   CHF (congestive heart failure) (HCC)    COPD (chronic obstructive pulmonary disease) (HCC)    Coronary artery disease    a. s/p prior PCI.   Hepatitis-C    Hypertension    IV drug abuse (HCC)    a. 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   Medically noncompliant    MI (myocardial infarction) (HCC)    Tobacco abuse     Current Outpatient Medications on File Prior to Visit  Medication Sig Dispense Refill   acetaminophen (TYLENOL) 325 MG tablet Take 650 mg by mouth every 6 (six) hours as needed for moderate pain or headache.     albuterol (PROVENTIL) (2.5 MG/3ML) 0.083% nebulizer solution Use 1 vial (2.5 mg total) by nebulization every 6 (six) hours as needed for wheezing or shortness of breath. 360 mL 2   albuterol (VENTOLIN HFA) 108 (90 Base) MCG/ACT inhaler Inhale 2 puffs into the lungs every 6 (six) hours as needed for wheezing or shortness of breath 6.7 g 2   albuterol (VENTOLIN HFA) 108 (90 Base) MCG/ACT inhaler Inhale 2 puffs into the lungs every 6 (six) hours as needed for wheezing or shortness of breath 6.7 g 2   amLODipine (NORVASC) 5 MG tablet Take 1 tablet (5 mg total) by mouth daily. 30 tablet 2   aspirin EC 81 MG tablet Take 81 mg by mouth daily. Swallow whole.     atorvastatin (LIPITOR) 40 MG tablet Take 1 tablet (40 mg total) by mouth daily. 30 tablet 2   furosemide (LASIX) 40 MG tablet Take 1 tablet (40 mg total) by mouth daily. 30 tablet 0   lisinopril (ZESTRIL) 20 MG tablet Take 1 tablet (20 mg total) by mouth daily. 30 tablet 2   metoprolol succinate (TOPROL-XL) 25 MG 24 hr tablet Take 1 tablet (25 mg total) by mouth in the morning. 30 tablet 2   nicotine (NICODERM CQ - DOSED IN MG/24  HOURS) 14 mg/24hr patch Place 1 patch (14 mg total) onto the skin daily. (Patient not taking: Reported on 08/13/2021) 30 patch 1   nitroGLYCERIN (NITROSTAT) 0.4 MG SL tablet Place 1 tablet (0.4 mg total) under the tongue every 5 (five) minutes x 3 doses as needed for chest pain. 25 tablet 2   predniSONE (DELTASONE) 20 MG tablet Take 2 tablets (40 mg total) by mouth daily. 10 tablet 0   tiotropium (SPIRIVA) 18 MCG inhalation capsule Place 1 capsule (18 mcg total) into inhaler and inhale daily. 30 capsule 12   No current facility-administered medications on file prior to visit.    Family History  Problem Relation Age of Onset   Rheum arthritis Mother    Heart disease Paternal Grandfather     Social History   Socioeconomic History   Marital status: Divorced    Spouse name: Not on file   Number of children: Not on file   Years of education: Not on file   Highest education level: Not on file  Occupational History   Not on file  Tobacco Use   Smoking status: Every Day    Packs/day: 0.50    Types: Cigarettes   Smokeless tobacco: Never  Vaping Use   Vaping Use: Never used  Substance and  Sexual Activity   Alcohol use: Never   Drug use: Yes    Types: Methamphetamines    Comment: heroin   Sexual activity: Not on file  Other Topics Concern   Not on file  Social History Narrative   Not on file   Social Determinants of Health   Financial Resource Strain: Not on file  Food Insecurity: Not on file  Transportation Needs: Not on file  Physical Activity: Not on file  Stress: Not on file  Social Connections: Not on file  Intimate Partner Violence: Not on file    Review of Systems: ROS negative except for what is noted on the assessment and plan.  Vitals:   11/10/21 1351  BP: 124/83  Pulse: 91  Temp: 97.7 F (36.5 C)  TempSrc: Oral  SpO2: 94%  Weight: 193 lb 4.8 oz (87.7 kg)  Height: 6' (1.829 m)    Physical Exam: Constitutional: well-appearing man, coughing  intermittently, in no acute distress HENT: normocephalic atraumatic, mucous membranes moist Eyes: conjunctiva non-erythematous Neck: supple Cardiovascular: regular rate and rhythm, no m/r/g Pulmonary/Chest: normal work of breathing on room air, lungs clear to auscultation bilaterally, wheezes heard bilaterally Abdominal: soft, non-tender, non-distended  Assessment & Plan:   COPD exacerbation (HCC) Patient is here for follow-up from the emergency department on October 24, 2021.  He presented to the ED with shortness of breath and chest tightening, and x-ray showed hyperinflated lungs.  He was given methylprednisone and 3 DuoNebs.  He was unable to get the DuoNebs due to their cost.  He states that he is doing much better.  He is currently on albuterol nebulizer, albuterol inhaler, Advair, and Spiriva.  His concern today, is that his shortness of breath is getting much more frequent. Within the past month he says he has about 20 episodes of SOB a day, and before then he stated he was having around 10.  He is still smoking half a pack of cigarettes every single day.  He is currently saturating room air at 94%.  On physical exam, wheezes were heard in the lungs bilaterally, and no fluid was heard.  Plan:  - In order to combat the more frequent shortness of breath episodes, we have increased his Advair to 500-50, 2 puffs a day.  - Patient is in the process of getting his orange card, and states that he could be getting it within the next week or so.  I placed a referral to our social worker in the clinic to help him with this process.  If orange card is obtained soon, I will place referral to see a pulmonologist to undergo respiratory therapy.  - I counseled patient on smoking cessation, and explained to him that that is the best way to reduce his symptoms.  Patient was understanding, and will try to reduce his cigarette usage.  Patient seen with Dr. Dierdre Forth Stayce Delancy, M.D. [facility]  Internal Medicine, PGY-1 Phone: [phone] Date 11/10/2021 Time 6:47 PM

## 2021-11-10 NOTE — Assessment & Plan Note (Signed)
Patient is here for follow-up from the emergency department on October 24, 2021.  He presented to the ED with shortness of breath and chest tightening, and x-ray showed hyperinflated lungs.  He was given methylprednisone and 3 DuoNebs.  He was unable to get the DuoNebs due to their cost.  He states that he is doing much better.  He is currently on albuterol nebulizer, albuterol inhaler, Advair, and Spiriva.  His concern today, is that his shortness of breath is getting much more frequent. Within the past month he says he has about 20 episodes of SOB a day, and before then he stated he was having around 10.  He is still smoking half a pack of cigarettes every single day.  He is currently saturating room air at 94%.  On physical exam, wheezes were heard in the lungs bilaterally, and no fluid was heard.  Plan:  - In order to combat the more frequent shortness of breath episodes, we have increased his Advair to 500-50, 2 puffs a day.  - Patient is in the process of getting his orange card, and states that he could be getting it within the next week or so.  I placed a referral to our social worker in the clinic to help him with this process.  If orange card is obtained soon, I will place referral to see a pulmonologist to undergo respiratory therapy.  - I counseled patient on smoking cessation, and explained to him that that is the best way to reduce his symptoms.  Patient was understanding, and will try to reduce his cigarette usage.

## 2021-11-11 NOTE — Progress Notes (Signed)
Internal Medicine Clinic Attending  I saw and evaluated the patient.  I personally confirmed the key portions of the history and exam documented by Dr. Nooruddin and I reviewed pertinent patient test results.  The assessment, diagnosis, and plan were formulated together and I agree with the documentation in the resident's note.  

## 2021-11-18 ENCOUNTER — Telehealth: Payer: Self-pay | Admitting: *Deleted

## 2021-11-18 ENCOUNTER — Other Ambulatory Visit (HOSPITAL_COMMUNITY): Payer: Self-pay

## 2021-11-18 NOTE — Chronic Care Management (AMB) (Signed)
  Care Coordination  Outreach Note  11/18/2021 Name: Francisco [name] MRN: [id number] DOB: October 22, 1960   Care Coordination Outreach Attempts  An unsuccessful telephone outreach was attempted today to offer the patient information about available care coordination services as a benefit of their health plan.   Follow Up Plan:  Additional outreach attempts will be made to offer the patient care coordination information and services.   Encounter Outcome:  No Answer  Gwenevere Ghazi  Care Coordination Care Guide  Direct Dial: [phone]

## 2021-11-20 ENCOUNTER — Other Ambulatory Visit (HOSPITAL_COMMUNITY): Payer: Self-pay

## 2021-11-27 ENCOUNTER — Other Ambulatory Visit (HOSPITAL_COMMUNITY): Payer: Self-pay

## 2021-11-30 ENCOUNTER — Other Ambulatory Visit (HOSPITAL_COMMUNITY): Payer: Self-pay

## 2021-11-30 NOTE — Chronic Care Management (AMB) (Unsigned)
  Care Coordination  Outreach Note  11/30/2021 Name: Eldrick Penick MRN: [id number] DOB: 1960/05/05   Care Coordination Outreach Attempts  A second unsuccessful outreach was attempted today to offer the patient with information about available care coordination services as a benefit of their health plan.     Follow Up Plan:  Additional outreach attempts will be made to offer the patient care coordination information and services.   Encounter Outcome:  No Answer   Gwenevere Ghazi  Care Coordination Care Guide  Direct Dial: [phone]

## 2021-12-01 NOTE — Chronic Care Management (AMB) (Signed)
  Care Coordination  Outreach Note  12/01/2021 Name: Francisco [name] MRN: [id number] DOB: June 23, 1960   Care Coordination Outreach Attempts  A third unsuccessful outreach was attempted today to offer the patient with information about available care coordination services as a benefit of their health plan.   Follow Up Plan:  Additional outreach attempts will be made to offer the patient care coordination information and services.   Encounter Outcome:  No Answer   Gwenevere Ghazi  Care Coordination Care Guide  Direct Dial: [phone]

## 2021-12-08 ENCOUNTER — Encounter: Payer: Self-pay | Admitting: Internal Medicine

## 2021-12-08 ENCOUNTER — Other Ambulatory Visit (HOSPITAL_COMMUNITY): Payer: Self-pay

## 2021-12-08 NOTE — Progress Notes (Deleted)
Combined S/D HF: Current regimen of metoprolol, lisinopril, and lasix. Unfortunately there are no SGLT-2 or GLP-1 agents on discounted drug list through the Outpatient Pharmacy to consider starting the patient on.  CAD  Dilated CM  HTN  COPD:Current regimen includes albuterol nebulizer, albuterol inhaler, Advair, and Spiriva. He was seen in ED 10/2021 for COPD exacerbation and at follow-up visit with our clinic around 1 month ago he continued to endorse progressive dyspnea. He continues to smoke, *.   At last OV, Advair was increased to 500-50, 2 puffs daily. He was given [employer] documents at that visit and ahs* been able to get this since that time.  Hep C:Referral previously placed to ID to establish care however several incomplete calls to schedule an appointment were made and the referral was ultimately closed. Plan:Referral to ID clinic with Dr. Ninetta Lights placed today.  Anxiety  Meth use

## 2021-12-09 ENCOUNTER — Encounter: Payer: Self-pay | Admitting: Internal Medicine

## 2021-12-11 ENCOUNTER — Other Ambulatory Visit (HOSPITAL_COMMUNITY): Payer: Self-pay

## 2021-12-22 ENCOUNTER — Other Ambulatory Visit (HOSPITAL_COMMUNITY): Payer: Self-pay

## 2022-01-01 ENCOUNTER — Other Ambulatory Visit: Payer: Self-pay | Admitting: Student

## 2022-01-01 ENCOUNTER — Other Ambulatory Visit (HOSPITAL_COMMUNITY): Payer: Self-pay

## 2022-01-02 ENCOUNTER — Encounter (HOSPITAL_COMMUNITY): Payer: Self-pay | Admitting: Emergency Medicine

## 2022-01-02 ENCOUNTER — Emergency Department (HOSPITAL_COMMUNITY): Payer: Self-pay

## 2022-01-02 ENCOUNTER — Telehealth: Payer: Self-pay

## 2022-01-02 ENCOUNTER — Emergency Department (HOSPITAL_COMMUNITY)
Admission: EM | Admit: 2022-01-02 | Discharge: 2022-01-02 | Disposition: A | Discharge status: Home / Self Care | Payer: Self-pay | Attending: Emergency Medicine | Admitting: Emergency Medicine

## 2022-01-02 DIAGNOSIS — J189 Pneumonia, unspecified organism: Secondary | ICD-10-CM

## 2022-01-02 DIAGNOSIS — Z20822 Contact with and (suspected) exposure to covid-19: Secondary | ICD-10-CM | POA: Insufficient documentation

## 2022-01-02 DIAGNOSIS — Z7982 Long term (current) use of aspirin: Secondary | ICD-10-CM | POA: Insufficient documentation

## 2022-01-02 DIAGNOSIS — Z7951 Long term (current) use of inhaled steroids: Secondary | ICD-10-CM | POA: Insufficient documentation

## 2022-01-02 DIAGNOSIS — J449 Chronic obstructive pulmonary disease, unspecified: Secondary | ICD-10-CM | POA: Insufficient documentation

## 2022-01-02 DIAGNOSIS — I509 Heart failure, unspecified: Secondary | ICD-10-CM | POA: Insufficient documentation

## 2022-01-02 DIAGNOSIS — J168 Pneumonia due to other specified infectious organisms: Secondary | ICD-10-CM | POA: Insufficient documentation

## 2022-01-02 LAB — BASIC METABOLIC PANEL
Anion gap: 12 (ref 5–15)
BUN: 7 mg/dL — ABNORMAL LOW (ref 8–23)
CO2: 26 mmol/L (ref 22–32)
Calcium: 10.2 mg/dL (ref 8.9–10.3)
Chloride: 97 mmol/L — ABNORMAL LOW (ref 98–111)
Creatinine, Ser: 1.28 mg/dL — ABNORMAL HIGH (ref 0.61–1.24)
GFR, Estimated: >60 mL/min (ref >60)
Glucose, Bld: 101 mg/dL — ABNORMAL HIGH (ref 70–99)
Potassium: 3.3 mmol/L — ABNORMAL LOW (ref 3.5–5.1)
Sodium: 135 mmol/L (ref 135–145)

## 2022-01-02 LAB — CBC
HCT: 43.5 % (ref 39.0–52.0)
Hemoglobin: 15.3 g/dL (ref 13.0–17.0)
MCH: 32.8 pg (ref 26.0–34.0)
MCHC: 35.2 g/dL (ref 30.0–36.0)
MCV: 93.1 fL (ref 80.0–100.0)
Platelets: 251 10*3/uL (ref 150–400)
RBC: 4.67 MIL/uL (ref 4.22–5.81)
RDW: 12.7 % (ref 11.5–15.5)
WBC: 26.1 10*3/uL — ABNORMAL HIGH (ref 4.0–10.5)
nRBC: 0 % (ref 0.0–0.2)

## 2022-01-02 LAB — RESP PANEL BY RT-PCR (FLU A&B, COVID) ARPGX2
Influenza A by PCR: NEGATIVE
Influenza B by PCR: NEGATIVE
SARS Coronavirus 2 by RT PCR: NEGATIVE

## 2022-01-02 LAB — BRAIN NATRIURETIC PEPTIDE: B Natriuretic Peptide: 482.9 pg/mL — ABNORMAL HIGH (ref 0.0–100.0)

## 2022-01-02 LAB — LACTIC ACID, PLASMA: Lactic Acid, Venous: 2.3 mmol/L (ref 0.5–1.9)

## 2022-01-02 MED ORDER — LACTATED RINGERS IV BOLUS
500.0000 mL | Freq: Once | INTRAVENOUS | Status: AC
  Administered 2022-01-02: 500 mL via INTRAVENOUS

## 2022-01-02 MED ORDER — LISINOPRIL 20 MG PO TABS
20.0000 mg | ORAL_TABLET | Freq: Every day | ORAL | 2 refills | Status: DC
  Filled 2022-01-02: qty 30, 30d supply, fill #0
  Filled 2022-02-03 – 2022-02-17 (×2): qty 30, 30d supply, fill #1
  Filled 2022-03-23: qty 30, 30d supply, fill #2

## 2022-01-02 MED ORDER — AMLODIPINE BESYLATE 5 MG PO TABS
5.0000 mg | ORAL_TABLET | Freq: Every day | ORAL | 2 refills | Status: DC
  Filled 2022-01-02: qty 30, 30d supply, fill #0
  Filled 2022-02-03 – 2022-02-17 (×2): qty 30, 30d supply, fill #1
  Filled 2022-03-23: qty 30, 30d supply, fill #2

## 2022-01-02 MED ORDER — IPRATROPIUM-ALBUTEROL 0.5-2.5 (3) MG/3ML IN SOLN
3.0000 mL | Freq: Once | RESPIRATORY_TRACT | Status: AC
  Administered 2022-01-02: 3 mL via RESPIRATORY_TRACT
  Filled 2022-01-02: qty 3

## 2022-01-02 MED ORDER — METOPROLOL SUCCINATE ER 25 MG PO TB24
25.0000 mg | ORAL_TABLET | Freq: Every morning | ORAL | 2 refills | Status: DC
  Filled 2022-01-02: qty 30, 30d supply, fill #0
  Filled 2022-02-03 – 2022-02-17 (×2): qty 30, 30d supply, fill #1
  Filled 2022-03-23: qty 30, 30d supply, fill #2

## 2022-01-02 MED ORDER — AMOXICILLIN-POT CLAVULANATE 875-125 MG PO TABS: 1.0000 | ORAL_TABLET | Freq: Two times a day (BID) | ORAL | 0 refills | Status: AC

## 2022-01-02 MED ORDER — SODIUM CHLORIDE 0.9 % IV SOLN
1.0000 g | Freq: Once | INTRAVENOUS | Status: AC
  Administered 2022-01-02: 1 g via INTRAVENOUS
  Filled 2022-01-02: qty 10

## 2022-01-02 MED ORDER — DOXYCYCLINE HYCLATE 100 MG PO CAPS: 100.0000 mg | ORAL_CAPSULE | Freq: Two times a day (BID) | ORAL | 0 refills | Status: DC

## 2022-01-02 MED ORDER — METHYLPREDNISOLONE SODIUM SUCC 125 MG IJ SOLR
125.0000 mg | Freq: Once | INTRAMUSCULAR | Status: AC
Start: 2022-01-02 — End: 2022-01-02
  Administered 2022-01-02: 125 mg via INTRAVENOUS
  Filled 2022-01-02: qty 2

## 2022-01-02 MED ORDER — SODIUM CHLORIDE 0.9 % IV SOLN
500.0000 mg | Freq: Once | INTRAVENOUS | Status: AC
  Administered 2022-01-02: 500 mg via INTRAVENOUS
  Filled 2022-01-02: qty 5

## 2022-01-02 NOTE — Telephone Encounter (Signed)
Patient called through main ED, stating he is at [facility] on [address] but they did not get his scripts. Called [facility] on [address], they received them and are filled. Attempted to call patient back, however no numbers listed work.

## 2022-01-02 NOTE — Discharge Instructions (Addendum)
As discussed, I recommend admission, however we will attempt to treat this pneumonia in an outpatient setting.  Please take the antibiotics as directed until finished.  Your COVID test is still pending, please follow-up on this via the MyChart app.  If you have any worsening fevers, chills, shortness of breath, chest pain, or any other new or concerning symptoms, please return to the ER.

## 2022-01-02 NOTE — ED Triage Notes (Signed)
Patient BIB [facility] from home for evaluation of shortness of breath that started three days ago. Patient states he has used his home nebulizers without relief and has increased use of his home PRN O2. Reported room air SpO2 92%.  1x duoneb and 125mg  solumedrol from EMS HR 134 20g saline lock in right hand.

## 2022-01-02 NOTE — ED Provider Notes (Signed)
[facility] EMERGENCY DEPARTMENT Provider Note   CSN: [id number] Arrival date & time: 01/02/22  1018     History  Chief Complaint  Patient presents with  . Shortness of Breath    Francisco [name] is a 60 y.o. male.  HPI 61 year old male with a history of COPD, MI, IV drug use, CHF w/ an EF of 45% presents to the ER with complaints of shortness of breath and a cough.  He states from Tuesday to yesterday he was having fevers, he checked his temperature at home and it was 102.  He endorses significant shortness of breath with walking.  He has had a productive cough.  He has oxygen at home that he wears, he has been having to use it more frequently.  He states he has been compliant with his Lasix, has not noticed any lower extremity edema.  States he has been significantly short of breath with ambulation.    Home Medications Prior to Admission medications   Medication Sig Start Date End Date Taking? Authorizing Provider  amoxicillin-clavulanate (AUGMENTIN) 875-125 MG tablet Take 1 tablet by mouth every 12 (twelve) hours for 7 days. 01/02/22 01/09/22 Yes Mare Ferrari, PA-C  doxycycline (VIBRAMYCIN) 100 MG capsule Take 1 capsule (100 mg total) by mouth 2 (two) times daily. 01/02/22  Yes Mare Ferrari, PA-C  acetaminophen (TYLENOL) 325 MG tablet Take 650 mg by mouth every 6 (six) hours as needed for moderate pain or headache.    [provider]  albuterol (PROVENTIL) (2.5 MG/3ML) 0.083% nebulizer solution Use 1 vial (2.5 mg total) by nebulization every 6 (six) hours as needed for wheezing or shortness of breath. 10/29/21   Masters, Katie, DO  albuterol (VENTOLIN HFA) 108 (90 Base) MCG/ACT inhaler Inhale 2 puffs into the lungs every 6 (six) hours as needed for wheezing or shortness of breath 09/25/21   Steffanie Rainwater, MD  albuterol (VENTOLIN HFA) 108 (90 Base) MCG/ACT inhaler Inhale 2 puffs into the lungs every 6 (six) hours as needed for wheezing or shortness of breath  10/19/21   Masters, Florentina Addison, DO  amLODipine (NORVASC) 5 MG tablet Take 1 tablet (5 mg total) by mouth daily. 01/02/22   Belva Agee, MD  aspirin EC 81 MG tablet Take 81 mg by mouth daily. Swallow whole.    [provider]  atorvastatin (LIPITOR) 40 MG tablet Take 1 tablet (40 mg total) by mouth daily. 09/21/21   Merrilyn Puma, MD  fluticasone-salmeterol (ADVAIR DISKUS) 500-50 MCG/ACT AEPB Inhale 1 puff into the lungs in the morning and at bedtime. 11/10/21   Nooruddin, Jason Fila, MD  furosemide (LASIX) 40 MG tablet Take 1 tablet (40 mg total) by mouth daily. 08/28/21   Verdene Lennert, MD  lisinopril (ZESTRIL) 20 MG tablet Take 1 tablet (20 mg total) by mouth daily. 01/02/22   Belva Agee, MD  metoprolol succinate (TOPROL-XL) 25 MG 24 hr tablet Take 1 tablet (25 mg total) by mouth in the morning. 01/02/22   Katsadouros, Vasilios, MD  nicotine (NICODERM CQ - DOSED IN MG/24 HOURS) 14 mg/24hr patch Place 1 patch (14 mg total) onto the skin daily. Patient not taking: Reported on 08/13/2021 06/24/21   Adron Bene, MD  nitroGLYCERIN (NITROSTAT) 0.4 MG SL tablet Place 1 tablet (0.4 mg total) under the tongue every 5 (five) minutes x 3 doses as needed for chest pain. 04/21/21   Masters, Katie, DO  predniSONE (DELTASONE) 20 MG tablet Take 2 tablets (40 mg total) by mouth daily. 10/25/21  Horton, Barbette Hair, MD  tiotropium (SPIRIVA) 18 MCG inhalation capsule Place 1 capsule (18 mcg total) into inhaler and inhale daily. 04/23/21   Delene Ruffini, MD      Allergies    Ivp dye [iodinated contrast media] and Tramadol    Review of Systems   Review of Systems Ten systems reviewed and are negative for acute change, except as noted in the HPI.   Physical Exam Updated Vital Signs BP 124/76   Pulse (!) 111   Temp 98.4 F (36.9 C) (Oral)   Resp 17   SpO2 96%  Physical Exam Vitals and nursing note reviewed.  Constitutional:      General: He is not in acute distress.    Appearance: He is  well-developed.  HENT:     Head: Normocephalic and atraumatic.  Eyes:     Conjunctiva/sclera: Conjunctivae normal.  Cardiovascular:     Rate and Rhythm: Normal rate and regular rhythm.     Heart sounds: No murmur heard. Pulmonary:     Effort: Pulmonary effort is normal. No respiratory distress.     Breath sounds: Wheezing, rhonchi and rales present.     Comments: Diffuse rales and rhonchi in the lower lobes bilaterally, with mild wheezing. Abdominal:     Palpations: Abdomen is soft.     Tenderness: There is no abdominal tenderness.  Musculoskeletal:        General: No swelling.     Cervical back: Neck supple.  Skin:    General: Skin is warm and dry.     Capillary Refill: Capillary refill takes less than 2 seconds.  Neurological:     Mental Status: He is alert.  Psychiatric:        Mood and Affect: Mood normal.     ED Results / Procedures / Treatments   Labs (all labs ordered are listed, but only abnormal results are displayed) Labs Reviewed  BASIC METABOLIC PANEL - Abnormal; Notable for the following components:      Result Value   Potassium 3.3 (*)    Chloride 97 (*)    Glucose, Bld 101 (*)    BUN 7 (*)    Creatinine, Ser 1.28 (*)    All other components within normal limits  CBC - Abnormal; Notable for the following components:   WBC 26.1 (*)    All other components within normal limits  LACTIC ACID, PLASMA - Abnormal; Notable for the following components:   Lactic Acid, Venous 2.3 (*)    All other components within normal limits  BRAIN NATRIURETIC PEPTIDE - Abnormal; Notable for the following components:   B Natriuretic Peptide 482.9 (*)    All other components within normal limits  CULTURE, BLOOD (ROUTINE X 2)  CULTURE, BLOOD (ROUTINE X 2)  RESP PANEL BY RT-PCR (FLU A&B, COVID) ARPGX2  LACTIC ACID, PLASMA    EKG None  Radiology DG Chest 2 View  Result Date: 01/02/2022 CLINICAL DATA:  Shortness of breath EXAM: CHEST - 2 VIEW COMPARISON:  10/24/2021  FINDINGS: Similar mild hyperinflation. Normal heart size and vascularity. Negative for edema, effusion or pneumothorax. Trachea midline. On the lateral view, there is ill-defined increased opacity posteriorly over the hemidiaphragms suspicious for basilar airspace process or pneumonia which is difficult to appreciate on the frontal view. Minor diffuse thoracic spondylosis. No acute osseous finding appreciated. IMPRESSION: Posterior basilar airspace opacity over the hemidiaphragms on the lateral view concerning for basilar pneumonia when compared to the prior study. Electronically Signed   By: Jerilynn Mages.  Shick M.D.   On: 01/02/2022 11:34    Procedures Procedures    Medications Ordered in ED Medications  cefTRIAXone (ROCEPHIN) 1 g in sodium chloride 0.9 % 100 mL IVPB (0 g Intravenous Stopped 01/02/22 1459)  azithromycin (ZITHROMAX) 500 mg in sodium chloride 0.9 % 250 mL IVPB (0 mg Intravenous Stopped 01/02/22 1511)  methylPREDNISolone sodium succinate (SOLU-MEDROL) 125 mg/2 mL injection 125 mg (125 mg Intravenous Given 01/02/22 1413)  ipratropium-albuterol (DUONEB) 0.5-2.5 (3) MG/3ML nebulizer solution 3 mL (3 mLs Nebulization Given 01/02/22 1459)  lactated ringers bolus 500 mL (500 mLs Intravenous New Bag/Given 01/02/22 1522)    ED Course/ Medical Decision Making/ A&P Clinical Course as of 01/02/22 1549  Sat Jan 02, 2022  8035 61 year old male presents to the ER with complaints of cough and shortness of breath with fevers.  He is tachycardic with a rate between 115-120 on arrival, BP stable, afebrile, 95% on room air.  Lung sounds with diffuse rales and wheezes.  Labs ordered in triage, reviewed and interpreted by me.  CBC with leukocytosis of 26.1, BMP with mild hypokalemia, mildly elevated creatinine of 1.28 but not consistent with AKI.  EKG reviewed consistent with sinus tachycardia   Chest x-ray ordered, reviewed, agree with radiology read evidence of pneumonia  COVID and Flu pending   Patient  was given a DuoNeb, Solu-Medrol for COPD exacerbation, azithromycin and Rocephin for a CAP.  Plan to ambulate and assess O2 sats and respiratory effort. [MB]  1523 Lactic acid 2.3.  Given 500 cc of IV fluids.  BNP somewhat elevated at 482.  PE still on the differential however the patient has had a productive cough, fevers, and also has a reaction to contrast dye.  I do think that this is more so pneumonia rather than a PE, however he could benefit from a VQ scan if admitted.    Patient was ambulated with O2 sats remaining at 95%.    Had a shared decision-making conversation with the patient.  I recommended that the patient be admitted, however he is adamant that he needs to go home as he is about to be kicked out of his living situation.  He states that he needs to be home through the weekend.  He states that if he feels worse, he will return.  Will prescribe Augmentin and doxycycline for him.  He got a dose of Rocephin and azithromycin while in the ER.  He voiced understanding and is agreeable.  Patient was discharged with very strict return precautions. [MB]    Clinical Course User Index [MB] Mare Ferrari, PA-C                           Medical Decision Making Amount and/or Complexity of Data Reviewed Labs: ordered. Radiology: ordered.    Final Clinical Impression(s) / ED Diagnoses Final diagnoses:  Pneumonia of both lungs due to infectious organism, unspecified part of lung    Rx / DC Orders ED Discharge Orders          Ordered    amoxicillin-clavulanate (AUGMENTIN) 875-125 MG tablet  Every 12 hours        01/02/22 1535    doxycycline (VIBRAMYCIN) 100 MG capsule  2 times daily        01/02/22 1535              Mare Ferrari, [place] 01/02/22 1541    Tegeler, Canary Brim, MD 01/02/22 [phone]

## 2022-01-02 NOTE — ED Notes (Signed)
One set of blood cultures obtained prior to abx. Pt is hard stick.

## 2022-01-02 NOTE — ED Notes (Signed)
Patient ambulated in hallway. Patient did well. O2% resulted as 96%. No reports of dizziness or light-headedness.

## 2022-01-04 ENCOUNTER — Other Ambulatory Visit (HOSPITAL_COMMUNITY): Payer: Self-pay

## 2022-01-07 LAB — CULTURE, BLOOD (ROUTINE X 2)
Culture: NO GROWTH
Special Requests: ADEQUATE

## 2022-01-12 ENCOUNTER — Other Ambulatory Visit: Payer: Self-pay

## 2022-01-12 ENCOUNTER — Other Ambulatory Visit: Payer: Self-pay | Admitting: Internal Medicine

## 2022-01-12 ENCOUNTER — Other Ambulatory Visit: Payer: Self-pay | Admitting: Student

## 2022-01-12 ENCOUNTER — Other Ambulatory Visit (HOSPITAL_COMMUNITY): Payer: Self-pay

## 2022-01-13 ENCOUNTER — Other Ambulatory Visit (HOSPITAL_COMMUNITY): Payer: Self-pay

## 2022-01-13 MED ORDER — ATORVASTATIN CALCIUM 40 MG PO TABS
40.0000 mg | ORAL_TABLET | Freq: Every day | ORAL | 2 refills | Status: DC
  Filled 2022-01-13: qty 30, 30d supply, fill #0
  Filled 2022-03-23: qty 30, 30d supply, fill #1

## 2022-01-13 MED ORDER — ALBUTEROL SULFATE HFA 108 (90 BASE) MCG/ACT IN AERS
2.0000 | INHALATION_SPRAY | Freq: Four times a day (QID) | RESPIRATORY_TRACT | 2 refills | Status: DC | PRN
  Filled 2022-01-13 – 2022-01-22 (×3): qty 6.7, 25d supply, fill #0
  Filled 2022-03-09: qty 6.7, 25d supply, fill #1
  Filled 2022-04-07: qty 6.7, 25d supply, fill #2

## 2022-01-15 ENCOUNTER — Other Ambulatory Visit (HOSPITAL_COMMUNITY): Payer: Self-pay

## 2022-01-22 ENCOUNTER — Other Ambulatory Visit (HOSPITAL_COMMUNITY): Payer: Self-pay

## 2022-01-25 ENCOUNTER — Other Ambulatory Visit (HOSPITAL_COMMUNITY): Payer: Self-pay

## 2022-01-26 ENCOUNTER — Other Ambulatory Visit (HOSPITAL_COMMUNITY): Payer: Self-pay

## 2022-02-02 ENCOUNTER — Other Ambulatory Visit: Payer: Self-pay

## 2022-02-02 ENCOUNTER — Other Ambulatory Visit (HOSPITAL_COMMUNITY): Payer: Self-pay

## 2022-02-03 ENCOUNTER — Other Ambulatory Visit (HOSPITAL_COMMUNITY): Payer: Self-pay

## 2022-02-05 ENCOUNTER — Other Ambulatory Visit (HOSPITAL_COMMUNITY): Payer: Self-pay

## 2022-02-05 ENCOUNTER — Other Ambulatory Visit: Payer: Self-pay

## 2022-02-10 ENCOUNTER — Other Ambulatory Visit (HOSPITAL_COMMUNITY): Payer: Self-pay

## 2022-02-12 ENCOUNTER — Other Ambulatory Visit (HOSPITAL_COMMUNITY): Payer: Self-pay

## 2022-02-17 ENCOUNTER — Other Ambulatory Visit (HOSPITAL_COMMUNITY): Payer: Self-pay

## 2022-02-18 ENCOUNTER — Ambulatory Visit (INDEPENDENT_AMBULATORY_CARE_PROVIDER_SITE_OTHER): Payer: Self-pay | Admitting: Internal Medicine

## 2022-02-18 ENCOUNTER — Other Ambulatory Visit (HOSPITAL_COMMUNITY): Payer: Self-pay

## 2022-02-18 ENCOUNTER — Encounter: Payer: Self-pay | Admitting: Internal Medicine

## 2022-02-18 ENCOUNTER — Other Ambulatory Visit: Payer: Self-pay

## 2022-02-18 VITALS — BP 116/82 | HR 91 | Temp 97.9°F | Ht 72.0 in | Wt 180.7 lb

## 2022-02-18 DIAGNOSIS — R634 Abnormal weight loss: Secondary | ICD-10-CM

## 2022-02-18 DIAGNOSIS — Z5989 Other problems related to housing and economic circumstances: Secondary | ICD-10-CM

## 2022-02-18 DIAGNOSIS — J441 Chronic obstructive pulmonary disease with (acute) exacerbation: Secondary | ICD-10-CM

## 2022-02-18 MED ORDER — AZITHROMYCIN 500 MG PO TABS
500.0000 mg | ORAL_TABLET | Freq: Every day | ORAL | 0 refills | Status: AC
  Filled 2022-02-18: qty 3, 3d supply, fill #0

## 2022-02-18 MED ORDER — IPRATROPIUM-ALBUTEROL 0.5-2.5 (3) MG/3ML IN SOLN: 3.0000 mL | Freq: Once | RESPIRATORY_TRACT | Status: DC

## 2022-02-18 MED ORDER — FLUTICASONE-SALMETEROL 500-50 MCG/ACT IN AEPB
1.0000 | INHALATION_SPRAY | Freq: Two times a day (BID) | RESPIRATORY_TRACT | 11 refills | Status: DC
  Filled 2022-02-18: qty 60, 30d supply, fill #0

## 2022-02-18 MED ORDER — PREDNISONE 50 MG PO TABS
ORAL_TABLET | ORAL | 0 refills | Status: DC
  Filled 2022-02-18: qty 5, 5d supply, fill #0

## 2022-02-18 NOTE — Patient Instructions (Addendum)
Thank you, [place] for allowing [place] to provide your care today.  COPD exacerbation I will call with results from flu test. Please pick up Advair. You need to use both Advair and spiriva daily. I am sending in 5 days of prednisone and azithromycin. If you are not feeling better or feel more short of breath please go to urgent care or ED.  Weight loss For now we will monitor this. Please try to eat more frequent meals.  I have made a referral to social work for help with orange card.  I have ordered the following labs for you:  Lab Orders         Coronavirus (COVID-19) with Influenza A and Influenza B       Referrals ordered today:   Referral Orders         AMB Referral to [facility]      I have ordered the following medication/changed the following medications:   Stop the following medications: Medications Discontinued During This Encounter  Medication Reason   predniSONE (DELTASONE) 20 MG tablet Completed Course   doxycycline (VIBRAMYCIN) 100 MG capsule Completed Course   fluticasone-salmeterol (ADVAIR DISKUS) 500-50 MCG/ACT AEPB Change in therapy     Start the following medications: Meds ordered this encounter  Medications   ipratropium-albuterol (DUONEB) 0.5-2.5 (3) MG/3ML nebulizer solution 3 mL   fluticasone-salmeterol (ADVAIR DISKUS) 500-50 MCG/ACT AEPB    Sig: Inhale 1 puff into the lungs in the morning and at bedtime.    Dispense:  60 each    Refill:  11    [facility]   azithromycin (ZITHROMAX) 500 MG tablet    Sig: Take 1 tablet (500 mg total) by mouth daily for 3 days. Take 1 tablet daily for 3 days.    Dispense:  3 tablet    Refill:  0   predniSONE (DELTASONE) 50 MG tablet    Sig: Take 1 tablet daily for 5 days    Dispense:  5 tablet    Refill:  0     Follow up:  1 week     We look forward to seeing you next time. Please call our clinic at [phone] if you have any questions or concerns. The best time to call is Monday-Friday  from 9am-4pm, but there is someone available 24/7. If after hours or the weekend, call the main hospital number and ask for the Internal Medicine Resident On-Call. If you need medication refills, please notify your pharmacy one week in advance and they will send [place] a request.   Thank you for trusting me with your care. Wishing you the best!   Christiana Fuchs, [place]

## 2022-02-18 NOTE — Progress Notes (Signed)
Subjective:  CC: increased shortness of breath, increased sputum production  HPI:  [name] is a 61 y.o. male with a past medical history stated below and presents today for increased shortness of breath for the last 3 days. He is also coughing up more sputum that is non purulent. He feels short of breath at work and has been using albuterol inhaler and uncountable amount of times. He also has been using albuterol nebulizer 2-3 times daily.  Please see problem based assessment and plan for additional details.  Past Medical History:  Diagnosis Date   CHF (congestive heart failure) (HCC)    COPD (chronic obstructive pulmonary disease) (HCC)    COPD with acute exacerbation (Francisco [name]) 10/21/2021   Coronary artery disease    a. s/p prior PCI.   Hepatitis-C    Hypertension    IV drug abuse (Francisco [name])    a. 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   Medically noncompliant    MI (myocardial infarction) (Francisco [name])    Tobacco abuse     Current Outpatient Medications on File Prior to Visit  Medication Sig Dispense Refill   acetaminophen (TYLENOL) 325 MG tablet Take 650 mg by mouth every 6 (six) hours as needed for moderate pain or headache.     albuterol (PROVENTIL) (2.5 MG/3ML) 0.083% nebulizer solution Use 1 vial (2.5 mg total) by nebulization every 6 (six) hours as needed for wheezing or shortness of breath. 360 mL 2   albuterol (VENTOLIN HFA) 108 (90 Base) MCG/ACT inhaler Inhale 2 puffs into the lungs every 6 (six) hours as needed for wheezing or shortness of breath 6.7 g 2   albuterol (VENTOLIN HFA) 108 (90 Base) MCG/ACT inhaler Inhale 2 puffs into the lungs every 6 (six) hours as needed for wheezing or shortness of breath 6.7 g 2   amLODipine (NORVASC) 5 MG tablet Take 1 tablet (5 mg total) by mouth daily. 30 tablet 2   aspirin EC 81 MG tablet Take 81 mg by mouth daily. Swallow whole.     atorvastatin (LIPITOR) 40 MG tablet Take 1 tablet (40 mg total) by mouth daily. 30 tablet 2    furosemide (LASIX) 40 MG tablet Take 1 tablet (40 mg total) by mouth daily. 30 tablet 0   lisinopril (ZESTRIL) 20 MG tablet Take 1 tablet (20 mg total) by mouth daily. 30 tablet 2   metoprolol succinate (TOPROL-XL) 25 MG 24 hr tablet Take 1 tablet (25 mg total) by mouth in the morning. 30 tablet 2   nicotine (NICODERM CQ - DOSED IN MG/24 HOURS) 14 mg/24hr patch Place 1 patch (14 mg total) onto the skin daily. (Patient not taking: Reported on 08/13/2021) 30 patch 1   nitroGLYCERIN (NITROSTAT) 0.4 MG SL tablet Place 1 tablet (0.4 mg total) under the tongue every 5 (five) minutes x 3 doses as needed for chest pain. 25 tablet 2   tiotropium (SPIRIVA) 18 MCG inhalation capsule Place 1 capsule (18 mcg total) into inhaler and inhale daily. 30 capsule 12   No current facility-administered medications on file prior to visit.    Family History  Problem Relation Age of Onset   Rheum arthritis Mother    Heart disease Paternal Grandfather     Social History   Socioeconomic History   Marital status: Divorced    Spouse name: Not on file   Number of children: Not on file   Years of education: Not on file   Highest education level: Not on file  Occupational History  Not on file  Tobacco Use   Smoking status: Every Day    Packs/day: 0.50    Types: Cigarettes   Smokeless tobacco: Never  Vaping Use   Vaping Use: Never used  Substance and Sexual Activity   Alcohol use: Never   Drug use: Yes    Types: Methamphetamines    Comment: heroin   Sexual activity: Not on file  Other Topics Concern   Not on file  Social History Narrative   Not on file   Social Determinants of Health   Financial Resource Strain: Not on file  Food Insecurity: Not on file  Transportation Needs: Not on file  Physical Activity: Not on file  Stress: Not on file  Social Connections: Not on file  Intimate Partner Violence: Not on file    Review of Systems: ROS negative except for what is noted on the assessment and  plan.  Objective:   Vitals:   02/18/22 1536 02/18/22 1546  BP: (!) 141/81 116/82  Pulse: 92 91  Temp: 97.9 F (36.6 C)   TempSrc: Oral   SpO2: 96%   Weight: 180 lb 11.2 oz (82 kg)   Height: 6' (1.829 m)     Physical Exam: Constitutional: well-appearing  Cardiovascular: regular rate and rhythm, no m/r/g Pulmonary/Chest: normal work of breathing on room air, diffuse wheezing present to lung bases bilaterally, no accessory muscle use MSK: no lower extremity swelling present   Assessment & Plan:  COPD exacerbation (HCC) Patient presents with 3 day history of worsening shortness of breath, increase clear sputum production. He has history of COPD and has been using spiriva daily with albuterol inhaler and nebulizer with minimal improvement in shortness of breath. He was previously prescribed advair but had not had this inhaler in several months as he did not know that he needed it. He has not been vaccinated for flu or covid. He endorses sinus congestion. He denies fever, chills, and myalgia.  Assessment: Differentials include viral illness vs weather change versus medication non adherence leading to exacerbation. Ambulatory saturation were > 92%. Plan: Duoneb given in clinic with some relief -Flu/COVID -prednisone 50 mg for 5 days -azithromycin 500 mg 3 days -advair refill sent in, I asked patient to continue with spiriva and add advair. -follow-up in 2 weeks  Insurance coverage problems Referral made to social work to provide him with support in getting insurance.  Weight loss He has lost about 20 lbs in the last 6 months unintentionally, about 10% of body weight. He states that he eats 1 large meal in the evenings and eats small snacks during day as food makes him sleepy. P: We talked about trying protein shakes during day as way to get calorie without making him sleepy. -continue to monitor for now    Patient discussed with Dr. Precious Bard Lysha Schrade, D.O. [facility]  Internal Medicine  PGY-2 Pager: [phone]  Phone: [phone] Date 02/19/2022  Time 4:15 PM

## 2022-02-19 ENCOUNTER — Encounter (HOSPITAL_COMMUNITY): Payer: Self-pay | Admitting: *Deleted

## 2022-02-19 ENCOUNTER — Emergency Department (HOSPITAL_COMMUNITY)
Admission: EM | Admit: 2022-02-19 | Discharge: 2022-02-19 | Discharge status: Against Medical Advice | Payer: Self-pay | Attending: Emergency Medicine | Admitting: Emergency Medicine

## 2022-02-19 ENCOUNTER — Other Ambulatory Visit: Payer: Self-pay

## 2022-02-19 DIAGNOSIS — Z5321 Procedure and treatment not carried out due to patient leaving prior to being seen by health care provider: Secondary | ICD-10-CM | POA: Insufficient documentation

## 2022-02-19 DIAGNOSIS — Z20822 Contact with and (suspected) exposure to covid-19: Secondary | ICD-10-CM | POA: Insufficient documentation

## 2022-02-19 DIAGNOSIS — F17200 Nicotine dependence, unspecified, uncomplicated: Secondary | ICD-10-CM | POA: Insufficient documentation

## 2022-02-19 DIAGNOSIS — R0789 Other chest pain: Secondary | ICD-10-CM | POA: Insufficient documentation

## 2022-02-19 DIAGNOSIS — R059 Cough, unspecified: Secondary | ICD-10-CM | POA: Insufficient documentation

## 2022-02-19 DIAGNOSIS — J3489 Other specified disorders of nose and nasal sinuses: Secondary | ICD-10-CM | POA: Insufficient documentation

## 2022-02-19 DIAGNOSIS — R634 Abnormal weight loss: Secondary | ICD-10-CM | POA: Insufficient documentation

## 2022-02-19 DIAGNOSIS — R0602 Shortness of breath: Secondary | ICD-10-CM | POA: Insufficient documentation

## 2022-02-19 LAB — CBC WITH DIFFERENTIAL/PLATELET
Abs Immature Granulocytes: 0.03 10*3/uL (ref 0.00–0.07)
Basophils Absolute: 0.1 10*3/uL (ref 0.0–0.1)
Basophils Relative: 1 %
Eosinophils Absolute: 0.3 10*3/uL (ref 0.0–0.5)
Eosinophils Relative: 4 %
HCT: 43 % (ref 39.0–52.0)
Hemoglobin: 14.8 g/dL (ref 13.0–17.0)
Immature Granulocytes: 0 %
Lymphocytes Relative: 25 %
Lymphs Abs: 1.9 10*3/uL (ref 0.7–4.0)
MCH: 33.2 pg (ref 26.0–34.0)
MCHC: 34.4 g/dL (ref 30.0–36.0)
MCV: 96.4 fL (ref 80.0–100.0)
Monocytes Absolute: 1 10*3/uL (ref 0.1–1.0)
Monocytes Relative: 13 %
Neutro Abs: 4.3 10*3/uL (ref 1.7–7.7)
Neutrophils Relative %: 57 %
Platelets: 247 10*3/uL (ref 150–400)
RBC: 4.46 MIL/uL (ref 4.22–5.81)
RDW: 13 % (ref 11.5–15.5)
WBC: 7.5 10*3/uL (ref 4.0–10.5)
nRBC: 0 % (ref 0.0–0.2)

## 2022-02-19 LAB — COMPREHENSIVE METABOLIC PANEL
ALT: 64 U/L — ABNORMAL HIGH (ref 0–44)
AST: 38 U/L (ref 15–41)
Albumin: 3.7 g/dL (ref 3.5–5.0)
Alkaline Phosphatase: 61 U/L (ref 38–126)
Anion gap: 12 (ref 5–15)
BUN: 13 mg/dL (ref 8–23)
CO2: 25 mmol/L (ref 22–32)
Calcium: 9.3 mg/dL (ref 8.9–10.3)
Chloride: 102 mmol/L (ref 98–111)
Creatinine, Ser: 1.06 mg/dL (ref 0.61–1.24)
GFR, Estimated: >60 mL/min (ref >60)
Glucose, Bld: 98 mg/dL (ref 70–99)
Potassium: 3.8 mmol/L (ref 3.5–5.1)
Sodium: 139 mmol/L (ref 135–145)
Total Bilirubin: 0.7 mg/dL (ref 0.3–1.2)
Total Protein: 6.6 g/dL (ref 6.5–8.1)

## 2022-02-19 LAB — COVID-19, FLU A+B NAA
Influenza A, NAA: NOT DETECTED
Influenza B, NAA: NOT DETECTED
SARS-CoV-2, NAA: NOT DETECTED

## 2022-02-19 LAB — RESP PANEL BY RT-PCR (FLU A&B, COVID) ARPGX2
Influenza A by PCR: NEGATIVE
Influenza B by PCR: NEGATIVE
SARS Coronavirus 2 by RT PCR: NEGATIVE

## 2022-02-19 MED ORDER — GUAIFENESIN ER 600 MG PO TB12
1200.0000 mg | ORAL_TABLET | Freq: Once | ORAL | Status: AC
  Administered 2022-02-19: 1200 mg via ORAL
  Filled 2022-02-19: qty 2

## 2022-02-19 MED ORDER — IPRATROPIUM-ALBUTEROL 0.5-2.5 (3) MG/3ML IN SOLN
3.0000 mL | Freq: Once | RESPIRATORY_TRACT | Status: AC
  Administered 2022-02-19: 3 mL via RESPIRATORY_TRACT
  Filled 2022-02-19: qty 3

## 2022-02-19 MED ORDER — METHYLPREDNISOLONE SODIUM SUCC 125 MG IJ SOLR
125.0000 mg | Freq: Once | INTRAMUSCULAR | Status: AC
  Administered 2022-02-19: 125 mg via INTRAVENOUS
  Filled 2022-02-19: qty 2

## 2022-02-19 NOTE — ED Triage Notes (Signed)
Pt reports cough for 3 days and shortness of breath. Pt says he feels like he has been wheezing. Pt saw his doctor yesterday, rec'd rx for steriods and abx, has not yet started. COVID test done yesterday, unsure of results.

## 2022-02-19 NOTE — Assessment & Plan Note (Signed)
Referral made to social work to provide him with support in getting insurance.

## 2022-02-19 NOTE — Assessment & Plan Note (Signed)
>>  ASSESSMENT AND PLAN FOR COPD (CHRONIC OBSTRUCTIVE PULMONARY DISEASE) (HCC) WRITTEN ON 02/19/2022  4:12 PM BY MASTERS, KATIE, DO  Patient presents with 3 day history of worsening shortness of breath, increase clear sputum production. He has history of COPD and has been using spiriva  daily with albuterol  inhaler and nebulizer with minimal improvement in shortness of breath. He was previously prescribed advair  but had not had this inhaler in several months as he did not know that he needed it. He has not been vaccinated for flu or covid. He endorses sinus congestion. He denies fever, chills, and myalgia.  Assessment: Differentials include viral illness vs weather change versus medication non adherence leading to exacerbation. Ambulatory saturation were > 92%. Plan: Duoneb given in clinic with some relief -Flu/COVID -prednisone  50 mg for 5 days -azithromycin  500 mg 3 days -advair  refill sent in, I asked patient to continue with spiriva  and add advair . -follow-up in 2 weeks

## 2022-02-19 NOTE — Assessment & Plan Note (Addendum)
Patient presents with 3 day history of worsening shortness of breath, increase clear sputum production. He has history of COPD and has been using spiriva daily with albuterol inhaler and nebulizer with minimal improvement in shortness of breath. He was previously prescribed advair but had not had this inhaler in several months as he did not know that he needed it. He has not been vaccinated for flu or covid. He endorses sinus congestion. He denies fever, chills, and myalgia.  Assessment: Differentials include viral illness vs weather change versus medication non adherence leading to exacerbation. Ambulatory saturation were > 92%. Plan: Duoneb given in clinic with some relief -Flu/COVID -prednisone 50 mg for 5 days -azithromycin 500 mg 3 days -advair refill sent in, I asked patient to continue with spiriva and add advair. -follow-up in 2 weeks

## 2022-02-19 NOTE — Assessment & Plan Note (Signed)
He has lost about 20 lbs in the last 6 months unintentionally, about 10% of body weight. He states that he eats 1 large meal in the evenings and eats small snacks during day as food makes him sleepy. P: We talked about trying protein shakes during day as way to get calorie without making him sleepy. -continue to monitor for now

## 2022-02-19 NOTE — ED Provider Triage Note (Signed)
Emergency Medicine Provider Triage Evaluation Note  Francisco [name] , a 61 y.o. male  was evaluated in triage.  Pt complains of increased SOB for the last few days. Reports using nebulizer 6-7x/day w/o relief. Uses O2 as needed. Has been using 2.5L since becoming more sob. No N/V. Endorses chest tightness. Reports saw PCP and has only been  getting worse--has not picked up meds though.Reports productive cough and runny nose. Still smokes 1/2 ppd.   Review of Systems  Positive: Sob, cough Negative: Fever, chills  Physical Exam  BP (!) 140/97   Pulse 86   Temp 98.7 F (37.1 C)   Resp 19   SpO2 91%  Gen:   Awake, no distress   Resp:  Normal effort, diffuse rhonchi throughout, 2.5L  MSK:   Moves extremities without difficulty   Medical Decision Making  Medically screening exam initiated at 7:09 PM.  Appropriate orders placed.  Simcha Speir was informed that the remainder of the evaluation will be completed by another provider, this initial triage assessment does not replace that evaluation, and the importance of remaining in the ED until their evaluation is complete.    Osvaldo Shipper, [place] 02/19/22 1913

## 2022-02-19 NOTE — ED Notes (Signed)
Pt has left [facility] due to long wait times. IV has been removed by nursing staff.

## 2022-02-21 ENCOUNTER — Emergency Department (HOSPITAL_COMMUNITY): Payer: Self-pay

## 2022-02-21 ENCOUNTER — Emergency Department (HOSPITAL_COMMUNITY)
Admission: EM | Admit: 2022-02-21 | Discharge: 2022-02-22 | Disposition: A | Discharge status: Home / Self Care | Payer: Self-pay | Attending: Emergency Medicine | Admitting: Emergency Medicine

## 2022-02-21 DIAGNOSIS — J441 Chronic obstructive pulmonary disease with (acute) exacerbation: Secondary | ICD-10-CM | POA: Insufficient documentation

## 2022-02-21 DIAGNOSIS — I2510 Atherosclerotic heart disease of native coronary artery without angina pectoris: Secondary | ICD-10-CM | POA: Insufficient documentation

## 2022-02-21 DIAGNOSIS — I509 Heart failure, unspecified: Secondary | ICD-10-CM | POA: Insufficient documentation

## 2022-02-21 DIAGNOSIS — Z7982 Long term (current) use of aspirin: Secondary | ICD-10-CM | POA: Insufficient documentation

## 2022-02-21 DIAGNOSIS — I110 Hypertensive heart disease with heart failure: Secondary | ICD-10-CM | POA: Insufficient documentation

## 2022-02-21 DIAGNOSIS — Z7951 Long term (current) use of inhaled steroids: Secondary | ICD-10-CM | POA: Insufficient documentation

## 2022-02-21 DIAGNOSIS — Z79899 Other long term (current) drug therapy: Secondary | ICD-10-CM | POA: Insufficient documentation

## 2022-02-21 LAB — CBC WITH DIFFERENTIAL/PLATELET
Abs Immature Granulocytes: 0.03 10*3/uL (ref 0.00–0.07)
Basophils Absolute: 0.1 10*3/uL (ref 0.0–0.1)
Basophils Relative: 1 %
Eosinophils Absolute: 0.2 10*3/uL (ref 0.0–0.5)
Eosinophils Relative: 2 %
HCT: 45.7 % (ref 39.0–52.0)
Hemoglobin: 14.7 g/dL (ref 13.0–17.0)
Immature Granulocytes: 0 %
Lymphocytes Relative: 25 %
Lymphs Abs: 2.6 10*3/uL (ref 0.7–4.0)
MCH: 32.3 pg (ref 26.0–34.0)
MCHC: 32.2 g/dL (ref 30.0–36.0)
MCV: 100.4 fL — ABNORMAL HIGH (ref 80.0–100.0)
Monocytes Absolute: 1.2 10*3/uL — ABNORMAL HIGH (ref 0.1–1.0)
Monocytes Relative: 11 %
Neutro Abs: 6.2 10*3/uL (ref 1.7–7.7)
Neutrophils Relative %: 61 %
Platelets: 269 10*3/uL (ref 150–400)
RBC: 4.55 MIL/uL (ref 4.22–5.81)
RDW: 12.8 % (ref 11.5–15.5)
WBC: 10.2 10*3/uL (ref 4.0–10.5)
nRBC: 0 % (ref 0.0–0.2)

## 2022-02-21 LAB — BASIC METABOLIC PANEL
Anion gap: 10 (ref 5–15)
BUN: 14 mg/dL (ref 8–23)
CO2: 28 mmol/L (ref 22–32)
Calcium: 9.7 mg/dL (ref 8.9–10.3)
Chloride: 102 mmol/L (ref 98–111)
Creatinine, Ser: 1.02 mg/dL (ref 0.61–1.24)
GFR, Estimated: >60 mL/min (ref >60)
Glucose, Bld: 86 mg/dL (ref 70–99)
Potassium: 3.5 mmol/L (ref 3.5–5.1)
Sodium: 140 mmol/L (ref 135–145)

## 2022-02-21 NOTE — ED Triage Notes (Signed)
Pt via [facility] from home c/o COPD exac x5 days, SHOB on exertion, productive cough  HR 92 94% RA  20LAC

## 2022-02-21 NOTE — ED Provider Triage Note (Signed)
Emergency Medicine Provider Triage Evaluation Note  Francisco [name] , a 61 y.o. male  was evaluated in triage.  Pt complains of worsening shortness of breath and wheezing.  He has a history of COPD.  He was seen in the emergency department 2 days ago but left without being seen.  He has been using his home albuterol nebulizer and pump very frequently.  He is on oxygen at night only but has been using during the day.  States that he can only walk across the room before becomes short of breath.  No fevers.  He is coughing up sputum that is clear.  Review of Systems  Positive: Wheezing, shortness of breath, cough Negative: Fever  Physical Exam  There were no vitals taken for this visit. Gen:   Awake, no distress   Resp:  Normal effort  MSK:   Moves extremities without difficulty  Other:  And expiratory wheezing, scattered, all fields --prolonged expiratory phase  Medical Decision Making  Medically screening exam initiated at 5:20 PM.  Appropriate orders placed.  Omario Ander was informed that the remainder of the evaluation will be completed by another provider, this initial triage assessment does not replace that evaluation, and the importance of remaining in the ED until their evaluation is complete.    Carlisle Cater, PA-C 02/21/22 1721

## 2022-02-22 ENCOUNTER — Other Ambulatory Visit (HOSPITAL_COMMUNITY): Payer: Self-pay

## 2022-02-22 ENCOUNTER — Ambulatory Visit: Payer: Self-pay | Admitting: Licensed Clinical Social Worker

## 2022-02-22 MED ORDER — METHYLPREDNISOLONE SODIUM SUCC 125 MG IJ SOLR
125.0000 mg | Freq: Once | INTRAMUSCULAR | Status: AC
  Administered 2022-02-22: 125 mg via INTRAVENOUS
  Filled 2022-02-22: qty 2

## 2022-02-22 MED ORDER — IPRATROPIUM BROMIDE HFA 17 MCG/ACT IN AERS
2.0000 | INHALATION_SPRAY | Freq: Once | RESPIRATORY_TRACT | Status: AC
  Administered 2022-02-22: 2 via RESPIRATORY_TRACT
  Filled 2022-02-22: qty 12.9

## 2022-02-22 MED ORDER — PREDNISONE 20 MG PO TABS
60.0000 mg | ORAL_TABLET | Freq: Every day | ORAL | 0 refills | Status: AC
  Filled 2022-02-22: qty 15, 5d supply, fill #0

## 2022-02-22 MED ORDER — ALBUTEROL SULFATE HFA 108 (90 BASE) MCG/ACT IN AERS
4.0000 | INHALATION_SPRAY | Freq: Once | RESPIRATORY_TRACT | Status: AC
  Administered 2022-02-22: 4 via RESPIRATORY_TRACT
  Filled 2022-02-22: qty 6.7

## 2022-02-22 NOTE — Patient Outreach (Signed)
  Care Coordination   02/22/2022 Name: Francisco [name] MRN: [id number] DOB: [birth year]   Care Coordination Outreach Attempts:  An unsuccessful telephone outreach was attempted today to offer the patient information about available care coordination services as a benefit of their health plan.   Follow Up Plan:  Additional outreach attempts will be made to offer the patient care coordination information and services.   Encounter Outcome:  No Answer  Care Coordination Interventions Activated:  No   Care Coordination Interventions:  No, not indicated    Milus Height, BSW , MSW, [place] Social Worker [facility]  [phone]

## 2022-02-22 NOTE — Discharge Instructions (Addendum)
Take next dose of prednisone tomorrow morning.  Recommend using breathing treatment every 4 hours for the next 24 hours and then as needed.

## 2022-02-22 NOTE — ED Provider Notes (Signed)
[facility] EMERGENCY DEPARTMENT Provider Note   CSN: [id number] Arrival date & time: 02/21/22  1658     History  Chief Complaint  Patient presents with   Shortness of Breath   Cough    Francisco [name] is a 61 y.o. male.  Patient here with cough and shortness of breath.  Has a history of COPD, hypertension, CAD, IV drug use, CHF.  Cough but no sputum production for the last several days.  Ran out of his albuterol inhaler.  Denies any fever chills or chest pain.  Nothing makes it worse or better.  Denies any nausea, vomiting, abdominal pain.  No weakness numbness or tingling.  The history is provided by the patient.       Home Medications Prior to Admission medications   Medication Sig Start Date End Date Taking? Authorizing Provider  acetaminophen (TYLENOL) 325 MG tablet Take 650 mg by mouth every 6 (six) hours as needed for moderate pain or headache.    [provider]  albuterol (PROVENTIL) (2.5 MG/3ML) 0.083% nebulizer solution Use 1 vial (2.5 mg total) by nebulization every 6 (six) hours as needed for wheezing or shortness of breath. 10/29/21   Masters, Katie, DO  albuterol (VENTOLIN HFA) 108 (90 Base) MCG/ACT inhaler Inhale 2 puffs into the lungs every 6 (six) hours as needed for wheezing or shortness of breath 09/25/21   Lacinda Axon, MD  albuterol (VENTOLIN HFA) 108 (90 Base) MCG/ACT inhaler Inhale 2 puffs into the lungs every 6 (six) hours as needed for wheezing or shortness of breath 01/13/22   Delene Ruffini, MD  amLODipine (NORVASC) 5 MG tablet Take 1 tablet (5 mg total) by mouth daily. 01/02/22   Riesa Pope, MD  aspirin EC 81 MG tablet Take 81 mg by mouth daily. Swallow whole.    [provider]  atorvastatin (LIPITOR) 40 MG tablet Take 1 tablet (40 mg total) by mouth daily. 01/13/22   Delene Ruffini, MD  fluticasone-salmeterol (ADVAIR DISKUS) 500-50 MCG/ACT AEPB Inhale 1 puff into the lungs in the morning and at  bedtime. 02/18/22   Masters, Katie, DO  furosemide (LASIX) 40 MG tablet Take 1 tablet (40 mg total) by mouth daily. 08/28/21   Jose Persia, MD  lisinopril (ZESTRIL) 20 MG tablet Take 1 tablet (20 mg total) by mouth daily. 01/02/22   Riesa Pope, MD  metoprolol succinate (TOPROL-XL) 25 MG 24 hr tablet Take 1 tablet (25 mg total) by mouth in the morning. 01/02/22   Katsadouros, Vasilios, MD  nicotine (NICODERM CQ - DOSED IN MG/24 HOURS) 14 mg/24hr patch Place 1 patch (14 mg total) onto the skin daily. Patient not taking: Reported on 08/13/2021 06/24/21   Delene Ruffini, MD  nitroGLYCERIN (NITROSTAT) 0.4 MG SL tablet Place 1 tablet (0.4 mg total) under the tongue every 5 (five) minutes x 3 doses as needed for chest pain. 04/21/21   Masters, Katie, DO  predniSONE (DELTASONE) 20 MG tablet Take 3 tablets (60 mg total) by mouth daily for 5 days. 02/22/22 02/27/22 Yes Briell Paulette, DO  tiotropium (SPIRIVA) 18 MCG inhalation capsule Place 1 capsule (18 mcg total) into inhaler and inhale daily. 04/23/21   Delene Ruffini, MD      Allergies    Ivp dye [iodinated contrast media] and Tramadol    Review of Systems   Review of Systems  Physical Exam Updated Vital Signs BP 136/88 (BP Location: Right Arm)   Pulse 89   Temp 98.6 F (37 C) (Oral)  Resp 20   SpO2 99%  Physical Exam Vitals and nursing note reviewed.  Constitutional:      General: He is not in acute distress.    Appearance: He is well-developed. He is not ill-appearing.  HENT:     Head: Normocephalic and atraumatic.     Mouth/Throat:     Mouth: Mucous membranes are moist.  Eyes:     Conjunctiva/sclera: Conjunctivae normal.     Pupils: Pupils are equal, round, and reactive to light.  Cardiovascular:     Rate and Rhythm: Normal rate and regular rhythm.     Pulses: Normal pulses.     Heart sounds: Normal heart sounds. No murmur heard. Pulmonary:     Effort: Pulmonary effort is normal. No tachypnea or respiratory distress.      Breath sounds: Wheezing present.  Abdominal:     Palpations: Abdomen is soft.     Tenderness: There is no abdominal tenderness.  Musculoskeletal:        General: No swelling. Normal range of motion.     Cervical back: Normal range of motion and neck supple.     Right lower leg: No edema.     Left lower leg: No edema.  Skin:    General: Skin is warm and dry.     Capillary Refill: Capillary refill takes less than 2 seconds.  Neurological:     General: No focal deficit present.     Mental Status: He is alert.  Psychiatric:        Mood and Affect: Mood normal.     ED Results / Procedures / Treatments   Labs (all labs ordered are listed, but only abnormal results are displayed) Labs Reviewed  CBC WITH DIFFERENTIAL/PLATELET - Abnormal; Notable for the following components:      Result Value   MCV 100.4 (*)    Monocytes Absolute 1.2 (*)    All other components within normal limits  BASIC METABOLIC PANEL    EKG None  Radiology DG Chest 2 View  Result Date: 02/21/2022 CLINICAL DATA:  COPD exacerbation. Wheezing and shortness of breath. EXAM: CHEST - 2 VIEW COMPARISON:  Radiograph 01/02/2022. FINDINGS: Chronic hyperinflation. Bronchial thickening, similar or mildly worsened. The left lung base opacity on prior exam has resolved. No new airspace disease. The heart is normal in size. Coronary stent or calcifications visualized. Stable mediastinal contours. No pleural fluid or pneumothorax. Stable midthoracic compression deformities. IMPRESSION: 1. Chronic hyperinflation. Bronchial thickening as equivocally worsened. Findings typical of COPD. 2. Left lung base opacity on prior exam has resolved. No new airspace disease. Electronically Signed   By: Narda Rutherford M.D.   On: 02/21/2022 18:12    Procedures Procedures    Medications Ordered in ED Medications  methylPREDNISolone sodium succinate (SOLU-MEDROL) 125 mg/2 mL injection 125 mg (125 mg Intravenous Given 02/22/22 0841)   albuterol (VENTOLIN HFA) 108 (90 Base) MCG/ACT inhaler 4 puff (4 puffs Inhalation Given 02/22/22 0840)  ipratropium (ATROVENT HFA) inhaler 2 puff (2 puffs Inhalation Given 02/22/22 0840)    ED Course/ Medical Decision Making/ A&P                           Medical Decision Making Risk Prescription drug management.   Romaldo Saville is here for cough.  History of COPD, IV drug use, CAD.  Denies any chest pain or major shortness of breath.  He has been coughing a lot the last few days.  Came here  couple days ago and left without being seen.  Feels like his asthma exacerbation.  His room air oxygenation both at rest and with ambulation is normal.  No signs of major respiratory distress.  Differential diagnosis is likely COPD exacerbation.  Have no concern for ACS or PE or volume overload.  We will get CBC, BMP, chest x-ray.  Will give breathing treatment, Solu-Medrol and reevaluate.  Per my review and interpretation of labs is no significant anemia, electrolyte abnormality, kidney injury.  Chest x-ray with no evidence of pneumonia or pneumothorax.  Feeling better after breathing treatment.  Wheezing has improved.  Will prescribe prednisone.  Recommend continued use of breathing treatments at home.  Understands return precautions.  This chart was dictated using voice recognition software.  Despite best efforts to proofread,  errors can occur which can change the documentation meaning.         Final Clinical Impression(s) / ED Diagnoses Final diagnoses:  COPD exacerbation (HCC)    Rx / DC Orders ED Discharge Orders          Ordered    predniSONE (DELTASONE) 20 MG tablet  Daily        02/22/22 0844              Virgina Norfolk, DO 02/22/22 0845

## 2022-02-22 NOTE — ED Notes (Signed)
Pt has been asked numerous times to keep his O2 on, he states that he doesn't always wear it at home so he wants to try sitting here with and without O2.

## 2022-02-23 ENCOUNTER — Ambulatory Visit: Payer: Self-pay | Admitting: Licensed Clinical Social Worker

## 2022-02-23 NOTE — Patient Outreach (Signed)
  Care Coordination   Initial Visit Note   02/23/2022 Name: Francisco [name] MRN: [id number] DOB: [birth year]  Francisco [name] is a 61 y.o. year old male who sees Delene Ruffini, MD for primary care. Patient answered call briefly and immediately ended call. SW unable to complete assessment. SW attempted a back to back call. Patient didn't answer and SW unable to leave VM. If patient would like further assistance please have patient contact PCP to submit an additional referral. SW will not attempt any additional attempts.  What matters to the patients health and wellness today?  [place] [facility]     Goals Addressed   None     SDOH assessments and interventions completed:  No     Care Coordination Interventions Activated:  Yes  Care Coordination Interventions:  No, not indicated   Follow up plan: No further intervention required.   Encounter Outcome:  Pt. Refused

## 2022-02-24 NOTE — Progress Notes (Signed)
Internal Medicine Clinic Attending  Case discussed with Dr. Sloan Leiter  At the time of the visit.  We reviewed the resident's history and exam and pertinent patient test results.  I agree with the assessment, diagnosis, and plan of care documented in the resident's note. Will need to closely follow-up for weight loss and continued discussion of evaluation following resolution of COPD exacerbation.

## 2022-02-24 NOTE — Addendum Note (Signed)
Addended by: Dickie La on: 02/24/2022 04:52 PM   Modules accepted: Level of Service

## 2022-02-26 ENCOUNTER — Other Ambulatory Visit (HOSPITAL_COMMUNITY): Payer: Self-pay

## 2022-03-03 ENCOUNTER — Encounter: Payer: Self-pay | Admitting: Internal Medicine

## 2022-03-09 ENCOUNTER — Other Ambulatory Visit (HOSPITAL_COMMUNITY): Payer: Self-pay

## 2022-03-23 ENCOUNTER — Other Ambulatory Visit: Payer: Self-pay

## 2022-03-23 ENCOUNTER — Other Ambulatory Visit (HOSPITAL_COMMUNITY): Payer: Self-pay

## 2022-03-24 ENCOUNTER — Other Ambulatory Visit (HOSPITAL_COMMUNITY): Payer: Self-pay

## 2022-03-25 ENCOUNTER — Other Ambulatory Visit (HOSPITAL_COMMUNITY): Payer: Self-pay

## 2022-04-07 ENCOUNTER — Other Ambulatory Visit (HOSPITAL_COMMUNITY): Payer: Self-pay

## 2022-04-08 ENCOUNTER — Emergency Department (HOSPITAL_COMMUNITY): Payer: No Typology Code available for payment source

## 2022-04-08 ENCOUNTER — Other Ambulatory Visit: Payer: Self-pay

## 2022-04-08 ENCOUNTER — Emergency Department (HOSPITAL_COMMUNITY)
Admission: EM | Admit: 2022-04-08 | Discharge: 2022-04-09 | Disposition: A | Discharge status: Home / Self Care | Payer: No Typology Code available for payment source | Attending: Emergency Medicine | Admitting: Emergency Medicine

## 2022-04-08 DIAGNOSIS — I10 Essential (primary) hypertension: Secondary | ICD-10-CM | POA: Insufficient documentation

## 2022-04-08 DIAGNOSIS — Z7951 Long term (current) use of inhaled steroids: Secondary | ICD-10-CM | POA: Diagnosis not present

## 2022-04-08 DIAGNOSIS — E876 Hypokalemia: Secondary | ICD-10-CM | POA: Diagnosis not present

## 2022-04-08 DIAGNOSIS — Z79899 Other long term (current) drug therapy: Secondary | ICD-10-CM | POA: Diagnosis not present

## 2022-04-08 DIAGNOSIS — Z7982 Long term (current) use of aspirin: Secondary | ICD-10-CM | POA: Diagnosis not present

## 2022-04-08 DIAGNOSIS — R7309 Other abnormal glucose: Secondary | ICD-10-CM | POA: Diagnosis not present

## 2022-04-08 DIAGNOSIS — F17210 Nicotine dependence, cigarettes, uncomplicated: Secondary | ICD-10-CM | POA: Insufficient documentation

## 2022-04-08 DIAGNOSIS — I509 Heart failure, unspecified: Secondary | ICD-10-CM | POA: Diagnosis not present

## 2022-04-08 DIAGNOSIS — I110 Hypertensive heart disease with heart failure: Secondary | ICD-10-CM | POA: Diagnosis not present

## 2022-04-08 DIAGNOSIS — Z1152 Encounter for screening for COVID-19: Secondary | ICD-10-CM | POA: Insufficient documentation

## 2022-04-08 DIAGNOSIS — J441 Chronic obstructive pulmonary disease with (acute) exacerbation: Secondary | ICD-10-CM | POA: Diagnosis present

## 2022-04-08 DIAGNOSIS — I2510 Atherosclerotic heart disease of native coronary artery without angina pectoris: Secondary | ICD-10-CM | POA: Insufficient documentation

## 2022-04-08 DIAGNOSIS — Z720 Tobacco use: Secondary | ICD-10-CM

## 2022-04-08 LAB — BASIC METABOLIC PANEL
Anion gap: 5 (ref 5–15)
BUN: 9 mg/dL (ref 8–23)
CO2: 32 mmol/L (ref 22–32)
Calcium: 9.5 mg/dL (ref 8.9–10.3)
Chloride: 100 mmol/L (ref 98–111)
Creatinine, Ser: 0.97 mg/dL (ref 0.61–1.24)
GFR, Estimated: >60 mL/min (ref >60)
Glucose, Bld: 110 mg/dL — ABNORMAL HIGH (ref 70–99)
Potassium: 3.3 mmol/L — ABNORMAL LOW (ref 3.5–5.1)
Sodium: 137 mmol/L (ref 135–145)

## 2022-04-08 LAB — CBC
HCT: 45 % (ref 39.0–52.0)
Hemoglobin: 15.4 g/dL (ref 13.0–17.0)
MCH: 32 pg (ref 26.0–34.0)
MCHC: 34.2 g/dL (ref 30.0–36.0)
MCV: 93.6 fL (ref 80.0–100.0)
Platelets: 292 10*3/uL (ref 150–400)
RBC: 4.81 MIL/uL (ref 4.22–5.81)
RDW: 12.7 % (ref 11.5–15.5)
WBC: 8.3 10*3/uL (ref 4.0–10.5)
nRBC: 0 % (ref 0.0–0.2)

## 2022-04-08 LAB — TROPONIN I (HIGH SENSITIVITY)
Troponin I (High Sensitivity): 10 ng/L (ref <18)
Troponin I (High Sensitivity): 9 ng/L (ref <18)

## 2022-04-08 LAB — RESP PANEL BY RT-PCR (RSV, FLU A&B, COVID)  RVPGX2
Influenza A by PCR: NEGATIVE
Influenza B by PCR: NEGATIVE
Resp Syncytial Virus by PCR: NEGATIVE
SARS Coronavirus 2 by RT PCR: NEGATIVE

## 2022-04-08 MED ORDER — METHYLPREDNISOLONE SODIUM SUCC 125 MG IJ SOLR
125.0000 mg | INTRAMUSCULAR | Status: AC
  Administered 2022-04-08: 125 mg via INTRAMUSCULAR

## 2022-04-08 MED ORDER — METHYLPREDNISOLONE SODIUM SUCC 125 MG IJ SOLR
125.0000 mg | Freq: Once | INTRAMUSCULAR | Status: DC
  Filled 2022-04-08: qty 2

## 2022-04-08 MED ORDER — IPRATROPIUM-ALBUTEROL 0.5-2.5 (3) MG/3ML IN SOLN
3.0000 mL | Freq: Once | RESPIRATORY_TRACT | Status: AC
  Administered 2022-04-08: 3 mL via RESPIRATORY_TRACT
  Filled 2022-04-08: qty 3

## 2022-04-08 NOTE — ED Triage Notes (Signed)
Patient here for evaluation of COPD exacerbation that started a few days ago. Patient has home O2 and nebulizer and has been using those but has been progressively more short of breath.

## 2022-04-08 NOTE — ED Provider Triage Note (Signed)
Emergency Medicine Provider Triage Evaluation Note  Francisco [name] , a 61 y.o. male  was evaluated in triage.  Pt complains of shortness of breath has been constant and worsening over the last 2 days.  He describes some chest tightness as well.  Patient does have a history of COPD and states feels similar to previous exacerbations in the past.  He has been using his inhaler at home with little relief.  No fever or chills.   Review of Systems  Positive:  Negative: See above   Physical Exam  BP (!) 146/94 (BP Location: Right Arm)   Pulse 93   Temp 98 F (36.7 C) (Oral)   Resp (!) 24   SpO2 91%  Gen:   Awake, no distress   Resp:  Tachypneic.  Diffuse expiratory wheezing MSK:   Moves extremities without difficulty  Other:    Medical Decision Making  Medically screening exam initiated at 5:29 PM.  Appropriate orders placed.  Francisco [name] was informed that the remainder of the evaluation will be completed by another provider, this initial triage assessment does not replace that evaluation, and the importance of remaining in the ED until their evaluation is complete.     Francisco [name], [place] 04/08/22 1730

## 2022-04-08 NOTE — ED Notes (Signed)
Pt states his breathing and chest pain feels a little better than before. Pt rates chest pain as 4/10 in central chest that he describes as tightness. Denies radiation of pain.

## 2022-04-09 ENCOUNTER — Other Ambulatory Visit: Payer: Self-pay

## 2022-04-09 ENCOUNTER — Other Ambulatory Visit (HOSPITAL_COMMUNITY): Payer: Self-pay

## 2022-04-09 MED ORDER — PREDNISONE 50 MG PO TABS
50.0000 mg | ORAL_TABLET | Freq: Every day | ORAL | 0 refills | Status: DC
  Filled 2022-04-09: qty 5, 5d supply, fill #0

## 2022-04-09 MED ORDER — POTASSIUM CHLORIDE CRYS ER 20 MEQ PO TBCR
40.0000 meq | EXTENDED_RELEASE_TABLET | Freq: Once | ORAL | Status: AC
  Administered 2022-04-09: 40 meq via ORAL
  Filled 2022-04-09: qty 2

## 2022-04-09 MED ORDER — IPRATROPIUM-ALBUTEROL 0.5-2.5 (3) MG/3ML IN SOLN
3.0000 mL | Freq: Once | RESPIRATORY_TRACT | Status: AC
  Administered 2022-04-09: 3 mL via RESPIRATORY_TRACT
  Filled 2022-04-09: qty 3

## 2022-04-09 NOTE — ED Provider Notes (Signed)
[facility] EMERGENCY DEPARTMENT Provider Note   CSN: [id number] Arrival date & time: 04/08/22  1716     History  Chief Complaint  Patient presents with   COPD    Francisco [name] is a 61 y.o. male.  The history is provided by the patient.  COPD  He has history of hypertension, coronary artery disease, heart failure, COPD and comes in with progressive shortness of breath over the last 4 days.  He has noted exertional dyspnea as well as orthopnea and paroxysmal nocturnal dyspnea.  He has noted some tightness in his chest.  When he uses his albuterol nebulizer, he does get some temporary relief.  He denies fever, chills, sweats.  Of note, he is normally on oxygen 2 L/min at home.  He continues to smoke 1/2 pack of cigarettes a day.   Home Medications Prior to Admission medications   Medication Sig Start Date End Date Taking? Authorizing Provider  acetaminophen (TYLENOL) 325 MG tablet Take 650 mg by mouth every 6 (six) hours as needed for moderate pain or headache.    [provider]  albuterol (PROVENTIL) (2.5 MG/3ML) 0.083% nebulizer solution Use 1 vial (2.5 mg total) by nebulization every 6 (six) hours as needed for wheezing or shortness of breath. 10/29/21   Masters, Katie, DO  albuterol (VENTOLIN HFA) 108 (90 Base) MCG/ACT inhaler Inhale 2 puffs into the lungs every 6 (six) hours as needed for wheezing or shortness of breath 09/25/21   Steffanie Rainwater, MD  albuterol (VENTOLIN HFA) 108 (90 Base) MCG/ACT inhaler Inhale 2 puffs into the lungs every 6 (six) hours as needed for wheezing or shortness of breath 01/13/22   Adron Bene, MD  amLODipine (NORVASC) 5 MG tablet Take 1 tablet (5 mg total) by mouth daily. 01/02/22   Belva Agee, MD  aspirin EC 81 MG tablet Take 81 mg by mouth daily. Swallow whole.    [provider]  atorvastatin (LIPITOR) 40 MG tablet Take 1 tablet (40 mg total) by mouth daily. 01/13/22   Adron Bene, MD   fluticasone-salmeterol (ADVAIR DISKUS) 500-50 MCG/ACT AEPB Inhale 1 puff into the lungs in the morning and at bedtime. 02/18/22   Masters, Katie, DO  furosemide (LASIX) 40 MG tablet Take 1 tablet (40 mg total) by mouth daily. 08/28/21   Verdene Lennert, MD  lisinopril (ZESTRIL) 20 MG tablet Take 1 tablet (20 mg total) by mouth daily. 01/02/22   Belva Agee, MD  metoprolol succinate (TOPROL-XL) 25 MG 24 hr tablet Take 1 tablet (25 mg total) by mouth in the morning. 01/02/22   Katsadouros, Vasilios, MD  nicotine (NICODERM CQ - DOSED IN MG/24 HOURS) 14 mg/24hr patch Place 1 patch (14 mg total) onto the skin daily. Patient not taking: Reported on 08/13/2021 06/24/21   Adron Bene, MD  nitroGLYCERIN (NITROSTAT) 0.4 MG SL tablet Place 1 tablet (0.4 mg total) under the tongue every 5 (five) minutes x 3 doses as needed for chest pain. 04/21/21   Masters, Katie, DO  tiotropium (SPIRIVA) 18 MCG inhalation capsule Place 1 capsule (18 mcg total) into inhaler and inhale daily. 04/23/21   Adron Bene, MD      Allergies    Ivp dye [iodinated contrast media] and Tramadol    Review of Systems   Review of Systems  All other systems reviewed and are negative.   Physical Exam Updated Vital Signs BP (!) 156/108   Pulse 95   Temp 97.9 F (36.6 C)   Resp 19  SpO2 94%  Physical Exam Vitals and nursing note reviewed.   61 year old male, resting comfortably and in no acute distress. Vital signs are significant for elevated blood pressure. Oxygen saturation is 94%, which is normal. Head is normocephalic and atraumatic. PERRLA, EOMI. Oropharynx is clear. Neck is nontender and supple without adenopathy.  Borderline JVD is present. Back is nontender and there is no CVA tenderness.  There is trace presacral edema. Lungs have diffuse expiratory wheezes without rales. Chest is nontender. Heart has regular rate and rhythm without murmur. Abdomen is soft, flat, nontender. Extremities have no cyanosis  or edema, full range of motion is present. Skin is warm and dry without rash. Neurologic: Mental status is normal, cranial nerves are intact, moves all extremities equally.  ED Results / Procedures / Treatments   Labs (all labs ordered are listed, but only abnormal results are displayed) Labs Reviewed  BASIC METABOLIC PANEL - Abnormal; Notable for the following components:      Result Value   Potassium 3.3 (*)    Glucose, Bld 110 (*)    All other components within normal limits  RESP PANEL BY RT-PCR (RSV, FLU A&B, COVID)  RVPGX2  CBC  TROPONIN I (HIGH SENSITIVITY)  TROPONIN I (HIGH SENSITIVITY)    EKG EKG Interpretation  Date/Time:  Thursday April 08 2022 17:09:35 EST Ventricular Rate:  94 PR Interval:  142 QRS Duration: 80 QT Interval:  368 QTC Calculation: 460 R Axis:   78 Text Interpretation: Normal sinus rhythm Nonspecific ST and T wave abnormality Abnormal ECG When compared with ECG of 19-Feb-2022 18:55, No significant change was found Confirmed by Dione Booze ([id number]) on 04/09/2022 12:45:37 AM  Radiology DG Chest 2 View  Result Date: 04/08/2022 CLINICAL DATA:  shortness of breath EXAM: CHEST - 2 VIEW COMPARISON:  02/21/2022 FINDINGS: Cardiac silhouette is unremarkable. No pneumothorax or pleural effusion. The lungs are clear. The visualized skeletal structures are unremarkable. IMPRESSION: No acute cardiopulmonary process. Electronically Signed   By: Layla Maw M.D.   On: 04/08/2022 18:38    Procedures Procedures  Cardiac monitor shows normal sinus rhythm, per my interpretation.  Medications Ordered in ED Medications  potassium chloride SA (KLOR-CON M) CR tablet 40 mEq (has no administration in time range)  ipratropium-albuterol (DUONEB) 0.5-2.5 (3) MG/3ML nebulizer solution 3 mL (3 mLs Nebulization Given 04/08/22 1745)  methylPREDNISolone sodium succinate (SOLU-MEDROL) 125 mg/2 mL injection 125 mg (125 mg Intramuscular Given 04/08/22 1756)   ipratropium-albuterol (DUONEB) 0.5-2.5 (3) MG/3ML nebulizer solution 3 mL (3 mLs Nebulization Given 04/09/22 0149)    ED Course/ Medical Decision Making/ A&P                           Medical Decision Making Amount and/or Complexity of Data Reviewed Labs: ordered. Radiology: ordered.  Risk Prescription drug management.   Dyspnea which seems to be more COPD than heart failure.  Doubt pneumonia.  Chest x-ray shows no evidence of pneumonia.  Have independently viewed the images, and agree with radiologist interpretation.  I have reviewed and interpreted his electrocardiogram, and my interpretation is nonspecific ST and T changes which are unchanged from prior.  No evidence of acute cardiac injury.  I have reviewed and interpreted his laboratory test, and my interpretation is mild hypokalemia, mildly elevated random glucose level, normal troponin x 2, normal CBC.  He has already received 1 nebulizer treatment in the emergency department and still has considerable wheezing.  I have  ordered a second nebulizer treatment.  He had also been given intramuscular methylprednisolone.  Because of hypokalemia, I have ordered a dose of oral potassium.  I have reviewed his past record, and he has multiple hospital admissions for COPD exacerbations-most recently 04/19/2021.  Following nebulizer treatment, patient feels that he is back to baseline.  On exam, there is still some mild to moderate wheezing.  I will give him a trial of ambulation to make sure that he does not demonstrate oxygen desaturation with mild exertion.  He was able to ambulate without any oxygen desaturation.  He is safe for discharge.  I am discharging him with a prescription for prednisone.  He is instructed to continue using his home albuterol inhaler and nebulizer.  I have counseled him on the need to stop smoking.  Final Clinical Impression(s) / ED Diagnoses Final diagnoses:  COPD exacerbation (HCC)  Hypokalemia  Elevated random blood  glucose level  Elevated blood pressure reading with diagnosis of hypertension  Tobacco abuse    Rx / DC Orders ED Discharge Orders          Ordered    predniSONE (DELTASONE) 50 MG tablet  Daily,   Status:  Discontinued        04/09/22 0309    predniSONE (DELTASONE) 50 MG tablet  Daily        04/09/22 0317              Dione Booze, MD 04/09/22 [phone]

## 2022-04-09 NOTE — ED Notes (Signed)
Pt ambulated in room with a pulse ox. Pt SpO2 sats maintained at 90% RA.

## 2022-04-09 NOTE — Discharge Instructions (Signed)
Continue using your inhaler and nebulizer as needed.  Use your home oxygen as needed.  Return to the emergency department if symptoms or not being adequately controlled at home.  Try to stop smoking.  The more you know, the worst your lungs will be.

## 2022-04-13 ENCOUNTER — Telehealth: Payer: Self-pay | Admitting: Internal Medicine

## 2022-04-13 NOTE — Telephone Encounter (Signed)
Transition Care Management Unsuccessful Follow-up Telephone Call  Date of discharge and from where:  04/08/22 [facility]  Attempts:  1st Attempt  Reason for unsuccessful TCM follow-up call:  No answer/busy

## 2022-04-24 IMAGING — CR DG CHEST 2V
2 series · 2 of 2 positions shown · non-contrast
Comparison: December 30, 2020.

CLINICAL DATA: Shortness of breath and cough for 1 week also with
dizziness in a 60-year-old male.

EXAM:
CHEST - 2 VIEW

[chest lat]
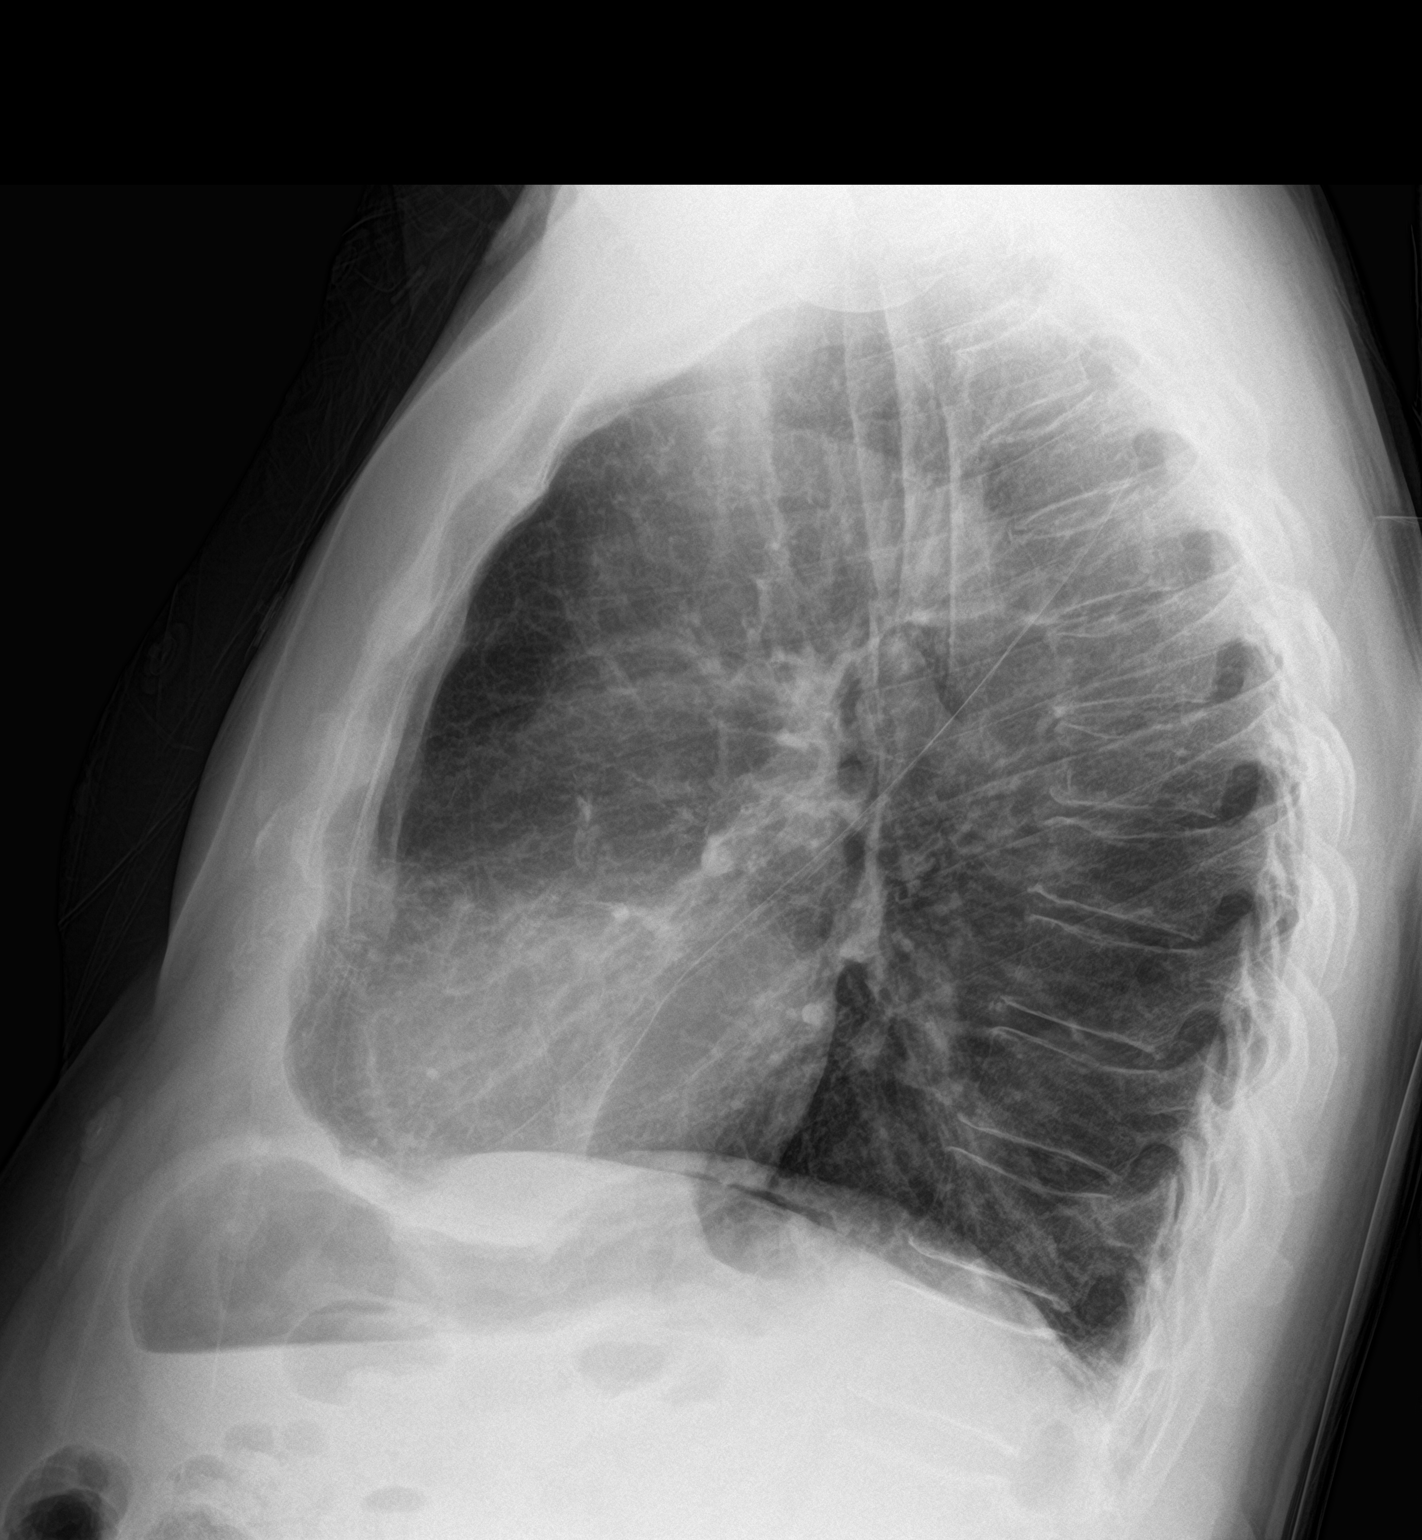

[chest ap]
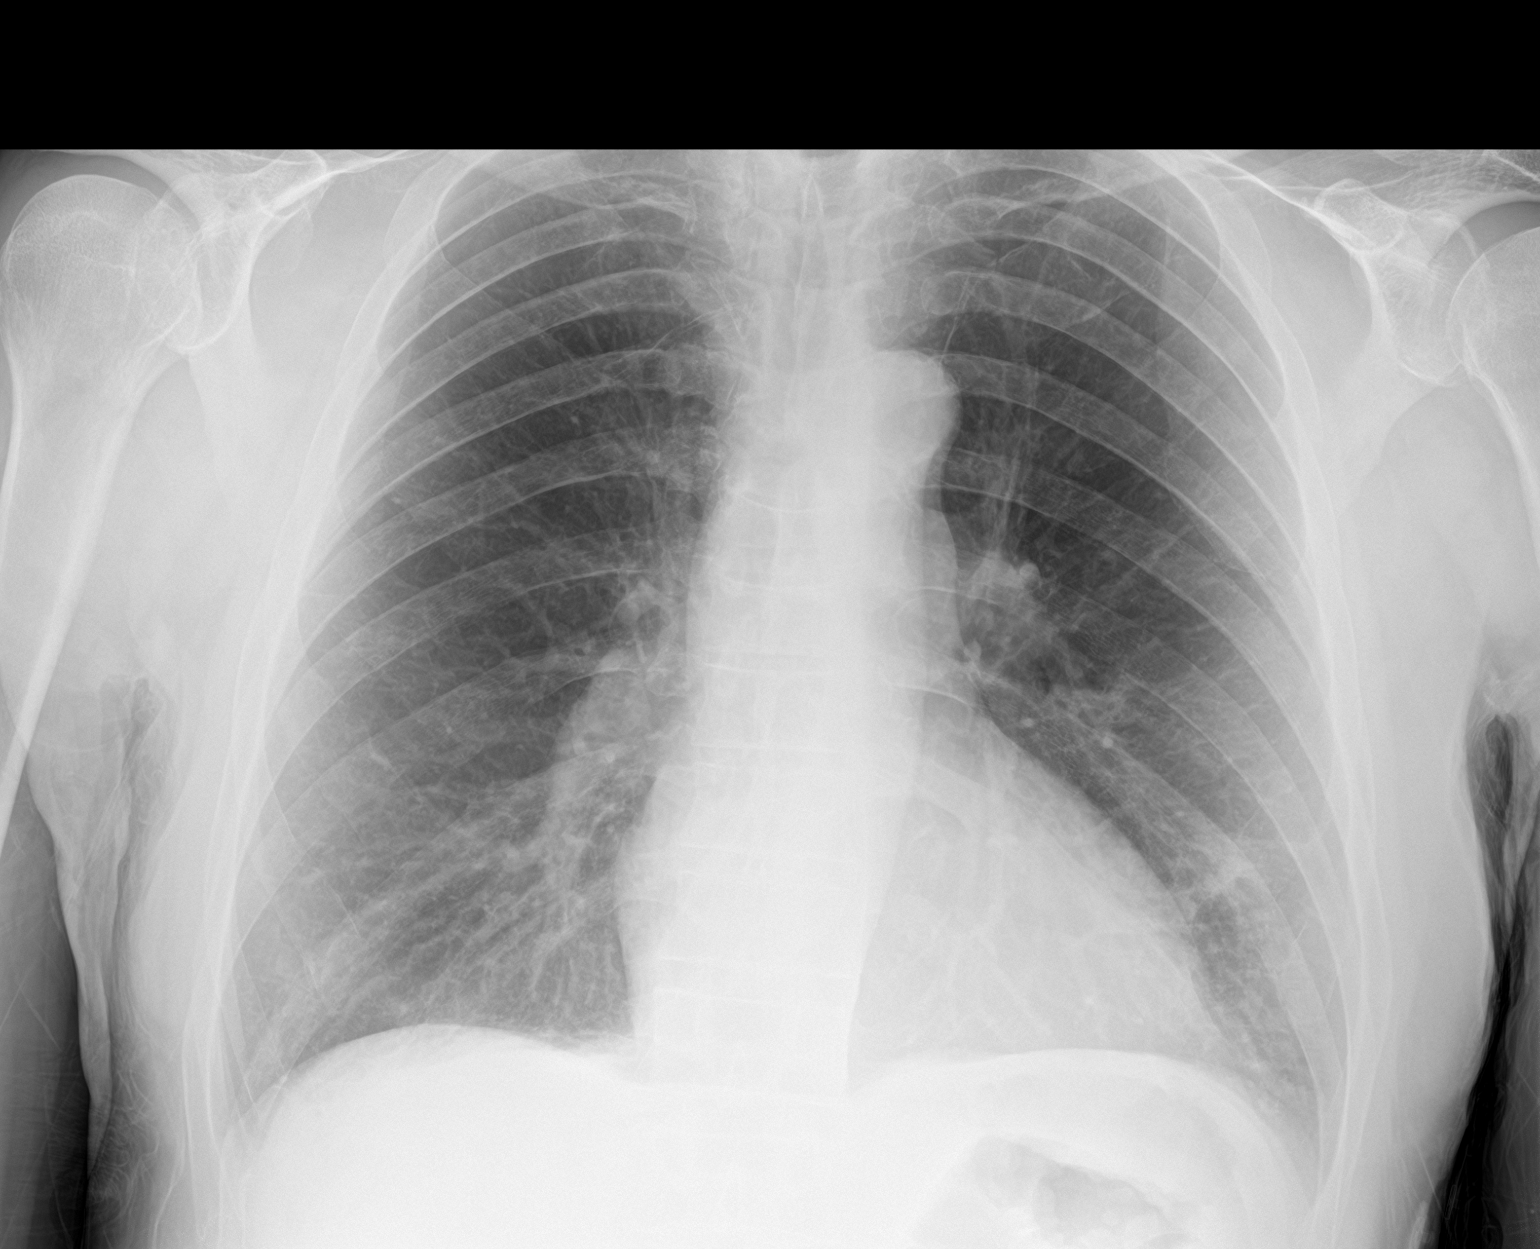

[2 of 2 positions shown; findings below may reference images not displayed]

FINDINGS: Trachea is midline.

Cardiomediastinal contours and hilar structures are normal.

New opacity in the LEFT lower lobe, this is developed since the
previous exam. No sign of pleural effusion.

No visible pneumothorax.

On limited assessment there is no acute skeletal process.
IMPRESSION: New opacity in the LEFT lower lobe suspicious for developing
pneumonia, suggest follow-up to ensure resolution given mildly
nodular appearance.

## 2022-05-05 ENCOUNTER — Other Ambulatory Visit (HOSPITAL_COMMUNITY): Payer: Self-pay

## 2022-05-05 ENCOUNTER — Ambulatory Visit (INDEPENDENT_AMBULATORY_CARE_PROVIDER_SITE_OTHER): Payer: No Typology Code available for payment source | Admitting: Licensed Clinical Social Worker

## 2022-05-05 ENCOUNTER — Ambulatory Visit: Payer: No Typology Code available for payment source | Admitting: Internal Medicine

## 2022-05-05 ENCOUNTER — Encounter: Payer: Self-pay | Admitting: Internal Medicine

## 2022-05-05 VITALS — BP 159/90 | HR 106 | Temp 98.0°F | Wt 178.3 lb

## 2022-05-05 DIAGNOSIS — F419 Anxiety disorder, unspecified: Secondary | ICD-10-CM

## 2022-05-05 DIAGNOSIS — F17210 Nicotine dependence, cigarettes, uncomplicated: Secondary | ICD-10-CM

## 2022-05-05 DIAGNOSIS — I10 Essential (primary) hypertension: Secondary | ICD-10-CM

## 2022-05-05 DIAGNOSIS — Z596 Low income: Secondary | ICD-10-CM

## 2022-05-05 DIAGNOSIS — Z5989 Other problems related to housing and economic circumstances: Secondary | ICD-10-CM

## 2022-05-05 DIAGNOSIS — J441 Chronic obstructive pulmonary disease with (acute) exacerbation: Secondary | ICD-10-CM | POA: Diagnosis not present

## 2022-05-05 DIAGNOSIS — Z59868 Other specified financial insecurity: Secondary | ICD-10-CM | POA: Insufficient documentation

## 2022-05-05 MED ORDER — LISINOPRIL 20 MG PO TABS
20.0000 mg | ORAL_TABLET | Freq: Every day | ORAL | 2 refills | Status: DC
  Filled 2022-05-05: qty 30, 30d supply, fill #0
  Filled 2022-06-10: qty 30, 30d supply, fill #1
  Filled 2022-07-14: qty 30, 30d supply, fill #2

## 2022-05-05 MED ORDER — FLUTICASONE-SALMETEROL 500-50 MCG/ACT IN AEPB
1.0000 | INHALATION_SPRAY | Freq: Two times a day (BID) | RESPIRATORY_TRACT | 11 refills | Status: DC
  Filled 2022-05-05: qty 60, 30d supply, fill #0

## 2022-05-05 MED ORDER — ALBUTEROL SULFATE (2.5 MG/3ML) 0.083% IN NEBU
2.5000 mg | INHALATION_SOLUTION | Freq: Four times a day (QID) | RESPIRATORY_TRACT | 2 refills | Status: DC | PRN
  Filled 2022-05-05 – 2022-05-13 (×3): qty 300, 25d supply, fill #0
  Filled 2022-08-10 – 2022-08-16 (×3): qty 300, 25d supply, fill #1
  Filled 2022-09-14: qty 270, 23d supply, fill #2
  Filled 2022-09-14: qty 300, 25d supply, fill #2
  Filled 2022-09-15: qty 75, 7d supply, fill #2
  Filled 2022-09-15: qty 225, 18d supply, fill #2

## 2022-05-05 MED ORDER — FUROSEMIDE 40 MG PO TABS
40.0000 mg | ORAL_TABLET | Freq: Every day | ORAL | 0 refills | Status: DC
  Filled 2022-05-05: qty 30, 30d supply, fill #0

## 2022-05-05 MED ORDER — AMLODIPINE BESYLATE 5 MG PO TABS
5.0000 mg | ORAL_TABLET | Freq: Every day | ORAL | 2 refills | Status: DC
  Filled 2022-05-05: qty 30, 30d supply, fill #0
  Filled 2022-06-10: qty 30, 30d supply, fill #1
  Filled 2022-07-14: qty 30, 30d supply, fill #2

## 2022-05-05 MED ORDER — IPRATROPIUM-ALBUTEROL 0.5-2.5 (3) MG/3ML IN SOLN
3.0000 mL | Freq: Once | RESPIRATORY_TRACT | Status: AC
  Administered 2022-05-05: 3 mL via RESPIRATORY_TRACT

## 2022-05-05 MED ORDER — TIOTROPIUM BROMIDE MONOHYDRATE 18 MCG IN CAPS
18.0000 ug | ORAL_CAPSULE | Freq: Every day | RESPIRATORY_TRACT | 12 refills | Status: DC
  Filled 2022-05-05: qty 30, 30d supply, fill #0

## 2022-05-05 MED ORDER — ALBUTEROL SULFATE HFA 108 (90 BASE) MCG/ACT IN AERS
2.0000 | INHALATION_SPRAY | Freq: Four times a day (QID) | RESPIRATORY_TRACT | 2 refills | Status: DC | PRN
  Filled 2022-05-05: qty 6.7, 25d supply, fill #0
  Filled 2022-05-21: qty 6.7, 25d supply, fill #1
  Filled 2022-06-10: qty 6.7, 25d supply, fill #2

## 2022-05-05 MED ORDER — ATORVASTATIN CALCIUM 40 MG PO TABS
40.0000 mg | ORAL_TABLET | Freq: Every day | ORAL | 2 refills | Status: DC
  Filled 2022-05-05: qty 30, 30d supply, fill #0
  Filled 2022-06-10: qty 30, 30d supply, fill #1
  Filled 2022-07-14: qty 30, 30d supply, fill #2

## 2022-05-05 MED ORDER — METOPROLOL SUCCINATE ER 25 MG PO TB24
25.0000 mg | ORAL_TABLET | Freq: Every morning | ORAL | 2 refills | Status: DC
  Filled 2022-05-05: qty 30, 30d supply, fill #0
  Filled 2022-06-10: qty 30, 30d supply, fill #1
  Filled 2022-07-14: qty 30, 30d supply, fill #2

## 2022-05-05 NOTE — Assessment & Plan Note (Signed)
Patient presented with significant hypertension. He was asymptomatic. Fortunately, this did improve upon recheck. Likely elevated in the setting of medication non-adherence. Will plan to recheck this at his next office visit after he has hopefully been able to afford his medications.

## 2022-05-05 NOTE — Assessment & Plan Note (Signed)
Patient reports he has been out of his medications for past 2 weeks due to financial trouble - he has not been able to work since before Christmas. He no longer has his rescue inhaler which he has been relying on often, nor has he been able to afford the advair that was prescribed at last OV.  He is hoping to be able to refill his medications tomorrow. He does note that he has been producing copious amounts of clear sputum. Slight increase in SOB.   On exam, he is resting comfortably, NAD. Heart RRR and no lower extremity edema, lungs do have significant wheeze throughout all lungs fields. No crackles. Breathing comfortably on RA and still satting 96% on RA  Patient likely with mild COPD exacerbation ni the setting of medication non-adherence. Will treat with Duoneb while in the office and provide [place] sample for patient to use in the event that he is not able to get his medications tomorrow. Refills sent. Will plan to see back in clinic in 2 weeks for follow up.

## 2022-05-05 NOTE — Assessment & Plan Note (Addendum)
Patient with multiple social determinants of health. He reports that he has not had stable income since before christmas and thus has not been able to afford his medications for several weeks. He also reports that his roommates father who owns the house that he lives in is planning on selling, and thus he will be homeless in 1 week. He does not have the funds to rent anywhere.   Patient is high risk for deterioration in health due to these circumstances. Case management/IBH has been asked to get involved to assist the patient. Case management was able to meet with patient today to discuss plans. Patient is to apply for disability, medicaid, and housing resources. Case management plans on following along and will call him sometime in the next week to check in.

## 2022-05-05 NOTE — Patient Instructions (Addendum)
Dear Mr. Longenecker,  Thank you for trusting [place] with your care today.   I have refilled your medications. Please try to fill them at the pharmacy tomorrow. We also gave you a breathing treatment while you were in the clinic as well as a sample of Breo Ellipta to use for breathing as well. Please use 1 puff daily.  You met with our case manager/behavior health specialist who will assist you with applying for disability, medicaid, and housing resources.  We would like to see you back in 2 weeks to check in on your progress.

## 2022-05-05 NOTE — Progress Notes (Signed)
   CC: medication assistance  HPI:Mr.Rea Reser is a 62 y.o. male who presents for evaluation of medication assistance/breathing. Please see individual problem based A/P for details.  Depression, PHQ-9: Based on the patients  [facility] Visit from 05/05/2022 in [place]  PHQ-9 Total Score 14      score we have .  Past Medical History:  Diagnosis Date   CHF (congestive heart failure) (HCC)    COPD (chronic obstructive pulmonary disease) (HCC)    COPD with acute exacerbation ([place]) 10/21/2021   Coronary artery disease    a. s/p prior PCI.   Hepatitis-C    Hypertension    IV drug abuse ([place])    a. 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   Medically noncompliant    MI (myocardial infarction) ([place])    Tobacco abuse    Review of Systems:   See HPI  Physical Exam: Vitals:   05/05/22 1516 05/05/22 1549  BP: (!) 204/110 (!) 159/90  Pulse: (!) 109 (!) 106  Temp: 98 F (36.7 C)   TempSrc: Oral   SpO2: 96%   Weight: 178 lb 4.8 oz (80.9 kg)    On exam, he is resting comfortably, NAD. Heart RRR and no lower extremity edema, lungs do have significant wheeze throughout all lungs fields. No crackles. Breathing comfortably on RA and still satting 96% on RA   Assessment & Plan:   See Encounters Tab for problem based charting.  Patient discussed with Dr. Angelia Mould

## 2022-05-05 NOTE — Assessment & Plan Note (Signed)
>>  ASSESSMENT AND PLAN FOR COPD (CHRONIC OBSTRUCTIVE PULMONARY DISEASE) (HCC) WRITTEN ON 05/05/2022  6:11 PM BY GRIFFITH PORTER, MD  Patient reports he has been out of his medications for past 2 weeks due to financial trouble - he has not been able to work since before Christmas. He no longer has his rescue inhaler which he has been relying on often, nor has he been able to afford the advair  that was prescribed at last OV.  He is hoping to be able to refill his medications tomorrow. He does note that he has been producing copious amounts of clear sputum. Slight increase in SOB.   On exam, he is resting comfortably, NAD. Heart RRR and no lower extremity edema, lungs do have significant wheeze throughout all lungs fields. No crackles. Breathing comfortably on RA and still satting 96% on RA  Patient likely with mild COPD exacerbation ni the setting of medication non-adherence. Will treat with Duoneb while in the office and provide Breo Ellipta  sample for patient to use in the event that he is not able to get his medications tomorrow. Refills sent. Will plan to see back in clinic in 2 weeks for follow up.

## 2022-05-13 ENCOUNTER — Other Ambulatory Visit (HOSPITAL_COMMUNITY): Payer: Self-pay

## 2022-05-13 ENCOUNTER — Other Ambulatory Visit: Payer: Self-pay

## 2022-05-14 ENCOUNTER — Other Ambulatory Visit (HOSPITAL_COMMUNITY): Payer: Self-pay

## 2022-05-17 NOTE — Progress Notes (Signed)
Internal Medicine Clinic Attending  I saw and evaluated the patient.  I personally confirmed the key portions of the history and exam documented by Dr. Gawaluck and I reviewed pertinent patient test results.  The assessment, diagnosis, and plan were formulated together and I agree with the documentation in the resident's note.  

## 2022-05-19 ENCOUNTER — Encounter: Payer: No Typology Code available for payment source | Admitting: Internal Medicine

## 2022-05-21 ENCOUNTER — Other Ambulatory Visit (HOSPITAL_COMMUNITY): Payer: Self-pay

## 2022-05-24 ENCOUNTER — Emergency Department (HOSPITAL_COMMUNITY)
Admission: EM | Admit: 2022-05-24 | Discharge: 2022-05-24 | Disposition: A | Discharge status: Home / Self Care | Payer: No Typology Code available for payment source | Attending: Student | Admitting: Student

## 2022-05-24 ENCOUNTER — Encounter (HOSPITAL_COMMUNITY): Payer: Self-pay

## 2022-05-24 ENCOUNTER — Other Ambulatory Visit (HOSPITAL_COMMUNITY): Payer: Self-pay

## 2022-05-24 ENCOUNTER — Other Ambulatory Visit: Payer: Self-pay

## 2022-05-24 DIAGNOSIS — I509 Heart failure, unspecified: Secondary | ICD-10-CM | POA: Insufficient documentation

## 2022-05-24 DIAGNOSIS — I2510 Atherosclerotic heart disease of native coronary artery without angina pectoris: Secondary | ICD-10-CM | POA: Insufficient documentation

## 2022-05-24 DIAGNOSIS — Z7982 Long term (current) use of aspirin: Secondary | ICD-10-CM | POA: Insufficient documentation

## 2022-05-24 DIAGNOSIS — L03317 Cellulitis of buttock: Secondary | ICD-10-CM | POA: Insufficient documentation

## 2022-05-24 DIAGNOSIS — Z7952 Long term (current) use of systemic steroids: Secondary | ICD-10-CM | POA: Diagnosis not present

## 2022-05-24 DIAGNOSIS — F17210 Nicotine dependence, cigarettes, uncomplicated: Secondary | ICD-10-CM | POA: Diagnosis not present

## 2022-05-24 DIAGNOSIS — I110 Hypertensive heart disease with heart failure: Secondary | ICD-10-CM | POA: Diagnosis not present

## 2022-05-24 DIAGNOSIS — J449 Chronic obstructive pulmonary disease, unspecified: Secondary | ICD-10-CM | POA: Insufficient documentation

## 2022-05-24 DIAGNOSIS — L0231 Cutaneous abscess of buttock: Secondary | ICD-10-CM | POA: Diagnosis present

## 2022-05-24 DIAGNOSIS — Z79899 Other long term (current) drug therapy: Secondary | ICD-10-CM | POA: Insufficient documentation

## 2022-05-24 DIAGNOSIS — E119 Type 2 diabetes mellitus without complications: Secondary | ICD-10-CM | POA: Diagnosis not present

## 2022-05-24 DIAGNOSIS — Z7951 Long term (current) use of inhaled steroids: Secondary | ICD-10-CM | POA: Insufficient documentation

## 2022-05-24 MED ORDER — IPRATROPIUM-ALBUTEROL 0.5-2.5 (3) MG/3ML IN SOLN
9.0000 mL | Freq: Once | RESPIRATORY_TRACT | Status: AC
  Administered 2022-05-24: 9 mL via RESPIRATORY_TRACT
  Filled 2022-05-24: qty 3

## 2022-05-24 MED ORDER — DOXYCYCLINE HYCLATE 100 MG PO CAPS
100.0000 mg | ORAL_CAPSULE | Freq: Two times a day (BID) | ORAL | 0 refills | Status: DC
  Filled 2022-05-24: qty 14, 7d supply, fill #0

## 2022-05-24 MED ORDER — CEFADROXIL 500 MG PO CAPS
500.0000 mg | ORAL_CAPSULE | Freq: Two times a day (BID) | ORAL | 0 refills | Status: DC
  Filled 2022-05-24: qty 14, 7d supply, fill #0

## 2022-05-24 MED ORDER — LIDOCAINE-EPINEPHRINE (PF) 2 %-1:200000 IJ SOLN
10.0000 mL | Freq: Once | INTRAMUSCULAR | Status: AC
  Administered 2022-05-24: 10 mL via INTRADERMAL
  Filled 2022-05-24: qty 20

## 2022-05-24 MED ORDER — CEFADROXIL 500 MG PO CAPS
500.0000 mg | ORAL_CAPSULE | Freq: Two times a day (BID) | ORAL | 0 refills | Status: AC
  Filled 2022-05-24: qty 14, 7d supply, fill #0

## 2022-05-24 MED ORDER — HYDROCODONE-ACETAMINOPHEN 5-325 MG PO TABS
1.0000 | ORAL_TABLET | Freq: Once | ORAL | Status: AC
  Administered 2022-05-24: 1 via ORAL
  Filled 2022-05-24: qty 1

## 2022-05-24 MED ORDER — PREDNISONE 10 MG PO TABS
40.0000 mg | ORAL_TABLET | Freq: Every day | ORAL | 0 refills | Status: DC
  Filled 2022-05-24: qty 16, 4d supply, fill #0

## 2022-05-24 MED ORDER — PREDNISONE 20 MG PO TABS
60.0000 mg | ORAL_TABLET | Freq: Once | ORAL | Status: AC
  Administered 2022-05-24: 60 mg via ORAL
  Filled 2022-05-24: qty 3

## 2022-05-24 MED ORDER — CEFADROXIL 500 MG PO CAPS
500.0000 mg | ORAL_CAPSULE | Freq: Two times a day (BID) | ORAL | Status: DC
  Administered 2022-05-24: 500 mg via ORAL
  Filled 2022-05-24: qty 1

## 2022-05-24 MED ORDER — DOXYCYCLINE HYCLATE 100 MG PO TABS
100.0000 mg | ORAL_TABLET | Freq: Once | ORAL | Status: AC
  Administered 2022-05-24: 100 mg via ORAL
  Filled 2022-05-24: qty 1

## 2022-05-24 MED ORDER — DOXYCYCLINE HYCLATE 100 MG PO CAPS
100.0000 mg | ORAL_CAPSULE | Freq: Two times a day (BID) | ORAL | 0 refills | Status: AC
  Filled 2022-05-24: qty 14, 7d supply, fill #0

## 2022-05-24 MED ORDER — IPRATROPIUM-ALBUTEROL 0.5-2.5 (3) MG/3ML IN SOLN
RESPIRATORY_TRACT | Status: AC
  Filled 2022-05-24: qty 6

## 2022-05-24 NOTE — ED Provider Triage Note (Signed)
Emergency Medicine Provider Triage Evaluation Note  Francisco [name] , a 62 y.o. male  was evaluated in triage.  Pt complains of abscess to posterior right thigh that he noticed approximately 4 days ago.  He has a history of recurrent abscesses.  He denies fever, chills, nausea, vomiting, chest pain, shortness of breath, or any other symptoms at this point.  States that he has not been on recent antibiotics.  Review of Systems  Positive: See HPI Negative: See HPI  Physical Exam  BP (!) 153/87 (BP Location: Right Arm)   Pulse 97   Temp 99 F (37.2 C) (Oral)   Resp 20   Ht 6' (1.829 m)   Wt 83.9 kg   SpO2 90%   BMI 25.09 kg/m  Gen:   Awake, no distress   Resp:  Normal effort  MSK:   Moves extremities without difficulty  Other:  Approximately 2 cm round raised fluctuant lesion to the right posterior thigh with approximately 4 cm surrounding area of erythema, slight induration, and tenderness  Medical Decision Making  Medically screening exam initiated at 8:20 PM.  Appropriate orders placed.  Francisco [name] was informed that the remainder of the evaluation will be completed by another provider, this initial triage assessment does not replace that evaluation, and the importance of remaining in the ED until their evaluation is complete.  Discussed risk and benefits of getting lab work for further evaluation the patient's complaint today.  As he has had no systemic symptoms, patient will proceed without lab work at this time and instead would like to be further evaluated by a provider once he is placed in the main ED for possible incision and drainage and discussion of antibiotics.   Suzzette Righter, PA-C 05/24/22 2022

## 2022-05-24 NOTE — ED Triage Notes (Signed)
Pt presents with a painful mass inferior to the buttock on his posterior calf. Pt reports it is tender to the touch.

## 2022-05-25 ENCOUNTER — Other Ambulatory Visit (HOSPITAL_COMMUNITY): Payer: Self-pay

## 2022-05-25 ENCOUNTER — Other Ambulatory Visit: Payer: Self-pay

## 2022-05-25 NOTE — ED Provider Notes (Signed)
[place] EMERGENCY DEPARTMENT AT [facility] Provider Note  CSN: [id number] Arrival date & time: 05/24/22 9563  Chief Complaint(s) Abscess  HPI Francisco [name] is a 62 y.o. male with PMH CHF, COPD, CAD status post MI, IV drug abuse who presents emergency department for evaluation of an rash with swelling.  Patient states that over the last 4 days he has noticed a worsening rash with swelling just under the glute on the posterior proximal right lower extremity.  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  He arrives with a centralized area of erythema with induration in this area and extensive erythema extending down the back of the thigh on the right.  He denies fever, chest pain, abdominal pain, nausea, vomiting or other systemic symptoms.  He does endorse some mild shortness of breath and wheezing as well and patient arrives hypoxic to 88%.  Past Medical History Past Medical History:  Diagnosis Date   CHF (congestive heart failure) (HCC)    COPD (chronic obstructive pulmonary disease) (HCC)    COPD with acute exacerbation (HCC) 10/21/2021   Coronary artery disease    a. s/p prior PCI.   Hepatitis-C    Hypertension    IV drug abuse (HCC)    a. 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   Medically noncompliant    MI (myocardial infarction) (HCC)    Tobacco abuse    Patient Active Problem List   Diagnosis Date Noted   Patient cannot afford medications 05/05/2022   Weight loss 02/19/2022   Combined systolic and diastolic congestive heart failure (HCC) 08/13/2021   Lung nodules 06/24/2021   Hepatitis C 06/24/2021   Lumbar back pain 06/24/2021   Anxiety 05/01/2021   Insurance coverage problems 04/27/2021   Hypertension 04/26/2021   Tobacco abuse 01/16/2021   Methamphetamine use (HCC) 08/17/2019   COPD exacerbation (HCC)    Dilated cardiomyopathy (HCC)    CAD S/P percutaneous coronary angioplasty    Home  Medication(s) Prior to Admission medications   Medication Sig Start Date End Date Taking? Authorizing Provider  predniSONE (DELTASONE) 10 MG tablet Take 4 tablets (40 mg total) by mouth daily. 05/24/22  Yes Onis Markoff, MD  acetaminophen (TYLENOL) 325 MG tablet Take 650 mg by mouth every 6 (six) hours as needed for moderate pain or headache.    [provider]  albuterol (PROVENTIL) (2.5 MG/3ML) 0.083% nebulizer solution Use 1 vial (2.5 mg total) by nebulization every 6 (six) hours as needed for wheezing or shortness of breath. 05/05/22   Adron Bene, MD  albuterol (VENTOLIN HFA) 108 (90 Base) MCG/ACT inhaler Inhale 2 puffs into the lungs every 6 (six) hours as needed for wheezing or shortness of breath 09/25/21   Steffanie Rainwater, MD  albuterol (VENTOLIN HFA) 108 (90 Base) MCG/ACT inhaler Inhale 2 puffs into the lungs every 6 (six) hours as needed for wheezing or shortness of breath 05/05/22   Adron Bene, MD  amLODipine (NORVASC) 5 MG tablet Take 1 tablet (5 mg total) by mouth daily. 05/05/22   Adron Bene, MD  aspirin EC 81 MG tablet Take 81 mg by mouth daily. Swallow whole.    [provider]  atorvastatin (LIPITOR) 40 MG tablet Take 1 tablet (40 mg total) by mouth daily. 05/05/22   Adron Bene, MD  cefadroxil (DURICEF) 500 MG capsule Take 1 capsule (500 mg total) by mouth 2 (two) times daily for 7 days. 05/24/22 06/01/22  Delsa Walder,  Lowell Mcgurk, MD  doxycycline (VIBRAMYCIN) 100 MG capsule Take 1 capsule (100 mg total) by mouth 2 (two) times daily for 7 days. 05/24/22 06/01/22  Nevena Rozenberg, MD  fluticasone-salmeterol (ADVAIR DISKUS) 500-50 MCG/ACT AEPB Inhale 1 puff into the lungs in the morning and at bedtime. 05/05/22   Delene Ruffini, MD  furosemide (LASIX) 40 MG tablet Take 1 tablet (40 mg total) by mouth daily. 05/05/22   Delene Ruffini, MD  lisinopril (ZESTRIL) 20 MG tablet Take 1 tablet (20 mg total) by mouth daily. 05/05/22   Delene Ruffini, MD   metoprolol succinate (TOPROL-XL) 25 MG 24 hr tablet Take 1 tablet (25 mg total) by mouth in the morning. 05/05/22   Delene Ruffini, MD  nicotine (NICODERM CQ - DOSED IN MG/24 HOURS) 14 mg/24hr patch Place 1 patch (14 mg total) onto the skin daily. Patient not taking: Reported on 08/13/2021 06/24/21   Delene Ruffini, MD  nitroGLYCERIN (NITROSTAT) 0.4 MG SL tablet Place 1 tablet (0.4 mg total) under the tongue every 5 (five) minutes x 3 doses as needed for chest pain. 04/21/21   Masters, Katie, DO  predniSONE (DELTASONE) 50 MG tablet Take 1 tablet (50 mg total) by mouth daily. 69/48/54   Delora Fuel, MD  tiotropium (SPIRIVA) 18 MCG inhalation capsule Place 1 capsule (18 mcg total) into inhaler and inhale daily. 05/05/22   Delene Ruffini, MD                                                                                                                                    Past Surgical History Past Surgical History:  Procedure Laterality Date   BACK SURGERY     NECK SURGERY     RIGHT/LEFT HEART CATH AND CORONARY ANGIOGRAPHY N/A 04/30/2019   Procedure: RIGHT/LEFT HEART CATH AND CORONARY ANGIOGRAPHY;  Surgeon: Leonie Man, MD;  Location: [place] [facility];  Service: Cardiovascular;  Laterality: N/A;   stints     Family History Family History  Problem Relation Age of Onset   Rheum arthritis Mother    Heart disease Paternal Grandfather     Social History Social History   Tobacco Use   Smoking status: Every Day    Packs/day: 0.50    Types: Cigarettes   Smokeless tobacco: Never  Vaping Use   Vaping Use: Never used  Substance Use Topics   Alcohol use: Never   Drug use: Yes    Types: Methamphetamines    Comment: heroin   Allergies Ivp dye [iodinated contrast media] and Tramadol  Review of Systems Review of Systems  Respiratory:  Positive for chest tightness and shortness of breath.   Skin:  Positive for rash.    Physical Exam Vital Signs  I have reviewed the triage  vital signs BP (!) 146/86   Pulse 78   Temp 98.9 F (37.2 C) (Oral)   Resp 18   Ht 6' (1.829 m)   [facility]  83.9 kg   SpO2 94%   BMI 25.09 kg/m   Physical Exam Constitutional:      General: He is not in acute distress.    Appearance: Normal appearance.  HENT:     Head: Normocephalic and atraumatic.     Nose: No congestion or rhinorrhea.  Eyes:     General:        Right eye: No discharge.        Left eye: No discharge.     Extraocular Movements: Extraocular movements intact.     Pupils: Pupils are equal, round, and reactive to light.  Cardiovascular:     Rate and Rhythm: Normal rate and regular rhythm.     Heart sounds: No murmur heard. Pulmonary:     Effort: No respiratory distress.     Breath sounds: Wheezing present. No rales.  Abdominal:     General: There is no distension.     Tenderness: There is no abdominal tenderness.  Musculoskeletal:        General: Normal range of motion.     Cervical back: Normal range of motion.  Skin:    General: Skin is warm and dry.     Findings: Rash present.  Neurological:     General: No focal deficit present.     Mental Status: He is alert.     ED Results and Treatments Labs (all labs ordered are listed, but only abnormal results are displayed) Labs Reviewed - No data to display                                                                                                                        Radiology No results found.  Pertinent labs & imaging results that were available during my care of the patient were reviewed by me and considered in my medical decision making (see MDM for details).  Medications Ordered in ED Medications  lidocaine-EPINEPHrine (XYLOCAINE W/EPI) 2 %-1:200000 (PF) injection 10 mL (10 mLs Intradermal Given 05/24/22 2207)  doxycycline (VIBRA-TABS) tablet 100 mg (100 mg Oral Given 05/24/22 2202)  HYDROcodone-acetaminophen (NORCO/VICODIN) 5-325 MG per tablet 1 tablet (1 tablet Oral Given 05/24/22 2202)   ipratropium-albuterol (DUONEB) 0.5-2.5 (3) MG/3ML nebulizer solution 9 mL (9 mLs Nebulization Given 05/24/22 2202)  predniSONE (DELTASONE) tablet 60 mg (60 mg Oral Given 05/24/22 2202)  Procedures .Critical Care  Performed by: Teressa Lower, MD Authorized by: Teressa Lower, MD   Critical care provider statement:    Critical care time (minutes):  30   Critical care was necessary to treat or prevent imminent or life-threatening deterioration of the following conditions:  Respiratory failure   Critical care was time spent personally by me on the following activities:  Development of treatment plan with patient or surrogate, discussions with consultants, evaluation of patient's response to treatment, examination of patient, ordering and review of laboratory studies, ordering and review of radiographic studies, ordering and performing treatments and interventions, pulse oximetry, re-evaluation of patient's condition and review of old charts .Marland KitchenIncision and Drainage  Date/Time: 05/25/2022 2:27 PM  Performed by: Teressa Lower, MD Authorized by: Teressa Lower, MD   Consent:    Consent obtained:  Verbal Universal protocol:    Patient identity confirmed:  Verbally with patient Location:    Type:  Abscess   Size:  1   Location:  Lower extremity   Lower extremity location:  Buttock   Buttock location:  R buttock Pre-procedure details:    Skin preparation:  Chlorhexidine with alcohol Sedation:    Sedation type:  None Anesthesia:    Anesthesia method:  Local infiltration   Local anesthetic:  Lidocaine 2% WITH epi Procedure type:    Complexity:  Simple Procedure details:    Incision types:  Single straight   Incision depth:  Dermal   Wound management:  Probed and deloculated   Drainage:  Purulent   Drainage amount:  Scant   Wound treatment:  Wound left  open   Packing materials:  None Post-procedure details:    Procedure completion:  Tolerated well, no immediate complications   (including critical care time)  Medical Decision Making / ED Course   This patient presents to the ED for concern of abscess and shortness of breath, this involves an extensive number of treatment options, and is a complaint that carries with it a high risk of complications and morbidity.  The differential diagnosis includes COPD exacerbation, cellulitis, abscess, erythema migrans, Lyme disease  MDM: Patient seen emergency room for evaluation of shortness of breath and an abscess.  Physical exam with a 1 cm area of dark induration surrounded by approximately 5 cm area of surrounding erythema that is warm and tender to palpation.  Bedside ultrasound showing small amount of fluid in the indurated region and an I&D was performed leading to removal of scant purulent drainage.  Patient did receive 3 DuoNebs and prednisone and on reevaluation his hypoxia has resolved and his shortness of breath is significantly improved.  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  He received his first dose in the emergency department and was discharged with Duricef, doxycycline and prednisone for 4 days for his COPD.  Vital stable at discharge and patient discharged   Additional history obtained:  -External records from outside source obtained and reviewed including: Chart review including previous notes, labs, imaging, consultation notes    Medicines ordered and prescription drug management: Meds ordered this encounter  Medications   lidocaine-EPINEPHrine (XYLOCAINE W/EPI) 2 %-1:200000 (PF) injection 10 mL   doxycycline (VIBRA-TABS) tablet 100 mg   DISCONTD: cefadroxil (DURICEF) capsule 500 mg   HYDROcodone-acetaminophen (NORCO/VICODIN) 5-325 MG per tablet 1 tablet    ipratropium-albuterol (DUONEB) 0.5-2.5 (3) MG/3ML nebulizer solution 9 mL   predniSONE (DELTASONE) tablet 60 mg  DISCONTD: ipratropium-albuterol (DUONEB) 0.5-2.5 (3) MG/3ML nebulizer solution    Pershing Cox, Kuch M: cabinet override   DISCONTD: cefadroxil (DURICEF) 500 MG capsule    Sig: Take 1 capsule (500 mg total) by mouth 2 (two) times daily for 7 days.    Dispense:  14 capsule    Refill:  0   DISCONTD: doxycycline (VIBRAMYCIN) 100 MG capsule    Sig: Take 1 capsule (100 mg total) by mouth 2 (two) times daily for 7 days.    Dispense:  14 capsule    Refill:  0   cefadroxil (DURICEF) 500 MG capsule    Sig: Take 1 capsule (500 mg total) by mouth 2 (two) times daily for 7 days.    Dispense:  14 capsule    Refill:  0   doxycycline (VIBRAMYCIN) 100 MG capsule    Sig: Take 1 capsule (100 mg total) by mouth 2 (two) times daily for 7 days.    Dispense:  14 capsule    Refill:  0   predniSONE (DELTASONE) 10 MG tablet    Sig: Take 4 tablets (40 mg total) by mouth daily.    Dispense:  16 tablet    Refill:  0    -I have reviewed the patients home medicines and have made adjustments as needed  Critical interventions Multiple DuoNebs, steroids    Cardiac Monitoring: The patient was maintained on a cardiac monitor.  I personally viewed and interpreted the cardiac monitored which showed an underlying rhythm of: NSR  Social Determinants of Health:  Factors impacting patients care include: Previous history of IV drug abuse   Reevaluation: After the interventions noted above, I reevaluated the patient and found that they have :improved  Co morbidities that complicate the patient evaluation  Past Medical History:  Diagnosis Date   CHF (congestive heart failure) (HCC)    COPD (chronic obstructive pulmonary disease) ([place])    COPD with acute exacerbation ([place]) 10/21/2021   Coronary artery disease    a. s/p prior PCI.   Hepatitis-C    Hypertension    IV drug abuse ([place])    a. previously  heroin, now methamphetamine (04/2019).   Medically noncompliant    MI (myocardial infarction) ([place])    Tobacco abuse       Dispostion: I considered admission for this patient, but at this time he does not meet inpatient criteria for admission he is safe for discharge the patient follow-up     Final Clinical Impression(s) / ED Diagnoses Final diagnoses:  Cellulitis of buttock  Cutaneous abscess of buttock     @PCDICTATION @    Teressa Lower, MD 05/25/22 1431

## 2022-06-01 ENCOUNTER — Other Ambulatory Visit (HOSPITAL_COMMUNITY): Payer: Self-pay

## 2022-06-03 NOTE — Progress Notes (Signed)
05/05/2022  [facility] saw patient during patient visit with PCP. Patient expressed concerns regarding counseling, disability, medicaid , housing and shelters. [facility] discussed and educated patient on all services. Patient open to counseling but unsure of next day off to have telehealth appointment with [facility]. Patient state he is unable to come in person.   PHQ9 completed  [place] Office Visit from 05/05/2022 in [place]  PHQ-9 Total Score 14       [facility] inquired on food assistance. Patient denied needing assistance from [facility] food pantry.   [facility] gave patient [facility] contact information and advised patient to contact [facility] when available.   Milus Height, MSW, [place]  [facility] Direct Dial:[phone]  Fax [phone] Main Office Phone: [phone] [place], [place] [address] Website: [place], [place]

## 2022-06-10 ENCOUNTER — Other Ambulatory Visit (HOSPITAL_COMMUNITY): Payer: Self-pay

## 2022-06-13 ENCOUNTER — Other Ambulatory Visit: Payer: Self-pay

## 2022-06-13 ENCOUNTER — Emergency Department (HOSPITAL_COMMUNITY): Payer: No Typology Code available for payment source

## 2022-06-13 ENCOUNTER — Encounter (HOSPITAL_COMMUNITY): Payer: Self-pay | Admitting: Emergency Medicine

## 2022-06-13 ENCOUNTER — Emergency Department (HOSPITAL_COMMUNITY)
Admission: EM | Admit: 2022-06-13 | Discharge: 2022-06-13 | Disposition: A | Discharge status: Home / Self Care | Payer: No Typology Code available for payment source | Attending: Emergency Medicine | Admitting: Emergency Medicine

## 2022-06-13 DIAGNOSIS — I110 Hypertensive heart disease with heart failure: Secondary | ICD-10-CM | POA: Insufficient documentation

## 2022-06-13 DIAGNOSIS — Z7982 Long term (current) use of aspirin: Secondary | ICD-10-CM | POA: Diagnosis not present

## 2022-06-13 DIAGNOSIS — I2510 Atherosclerotic heart disease of native coronary artery without angina pectoris: Secondary | ICD-10-CM | POA: Diagnosis not present

## 2022-06-13 DIAGNOSIS — U071 COVID-19: Secondary | ICD-10-CM | POA: Insufficient documentation

## 2022-06-13 DIAGNOSIS — I5040 Unspecified combined systolic (congestive) and diastolic (congestive) heart failure: Secondary | ICD-10-CM | POA: Insufficient documentation

## 2022-06-13 DIAGNOSIS — J449 Chronic obstructive pulmonary disease, unspecified: Secondary | ICD-10-CM | POA: Diagnosis not present

## 2022-06-13 DIAGNOSIS — R0602 Shortness of breath: Secondary | ICD-10-CM | POA: Diagnosis present

## 2022-06-13 LAB — I-STAT VENOUS BLOOD GAS, ED
Acid-Base Excess: 3 mmol/L — ABNORMAL HIGH (ref 0.0–2.0)
Bicarbonate: 27.6 mmol/L (ref 20.0–28.0)
Calcium, Ion: 1.13 mmol/L — ABNORMAL LOW (ref 1.15–1.40)
HCT: 50 % (ref 39.0–52.0)
Hemoglobin: 17 g/dL (ref 13.0–17.0)
O2 Saturation: 57 %
Potassium: 4.3 mmol/L (ref 3.5–5.1)
Sodium: 137 mmol/L (ref 135–145)
TCO2: 29 mmol/L (ref 22–32)
pCO2, Ven: 40.8 mmHg — ABNORMAL LOW (ref 44–60)
pH, Ven: 7.438 — ABNORMAL HIGH (ref 7.25–7.43)
pO2, Ven: 29 mmHg — CL (ref 32–45)

## 2022-06-13 LAB — CBC WITH DIFFERENTIAL/PLATELET
Abs Immature Granulocytes: 0.01 10*3/uL (ref 0.00–0.07)
Basophils Absolute: 0.1 10*3/uL (ref 0.0–0.1)
Basophils Relative: 1 %
Eosinophils Absolute: 0.1 10*3/uL (ref 0.0–0.5)
Eosinophils Relative: 2 %
HCT: 49.4 % (ref 39.0–52.0)
Hemoglobin: 16.6 g/dL (ref 13.0–17.0)
Immature Granulocytes: 0 %
Lymphocytes Relative: 16 %
Lymphs Abs: 1.1 10*3/uL (ref 0.7–4.0)
MCH: 31.6 pg (ref 26.0–34.0)
MCHC: 33.6 g/dL (ref 30.0–36.0)
MCV: 93.9 fL (ref 80.0–100.0)
Monocytes Absolute: 0.9 10*3/uL (ref 0.1–1.0)
Monocytes Relative: 13 %
Neutro Abs: 4.6 10*3/uL (ref 1.7–7.7)
Neutrophils Relative %: 68 %
Platelets: 212 10*3/uL (ref 150–400)
RBC: 5.26 MIL/uL (ref 4.22–5.81)
RDW: 13.2 % (ref 11.5–15.5)
WBC: 6.7 10*3/uL (ref 4.0–10.5)
nRBC: 0 % (ref 0.0–0.2)

## 2022-06-13 LAB — BRAIN NATRIURETIC PEPTIDE: B Natriuretic Peptide: 28.4 pg/mL (ref 0.0–100.0)

## 2022-06-13 LAB — BASIC METABOLIC PANEL
Anion gap: 9 (ref 5–15)
BUN: 12 mg/dL (ref 8–23)
CO2: 27 mmol/L (ref 22–32)
Calcium: 9.5 mg/dL (ref 8.9–10.3)
Chloride: 99 mmol/L (ref 98–111)
Creatinine, Ser: 0.98 mg/dL (ref 0.61–1.24)
GFR, Estimated: >60 mL/min (ref >60)
Glucose, Bld: 116 mg/dL — ABNORMAL HIGH (ref 70–99)
Potassium: 4.3 mmol/L (ref 3.5–5.1)
Sodium: 135 mmol/L (ref 135–145)

## 2022-06-13 LAB — TROPONIN I (HIGH SENSITIVITY): Troponin I (High Sensitivity): 9 ng/L (ref <18)

## 2022-06-13 LAB — RESP PANEL BY RT-PCR (RSV, FLU A&B, COVID)  RVPGX2
Influenza A by PCR: NEGATIVE
Influenza B by PCR: NEGATIVE
Resp Syncytial Virus by PCR: NEGATIVE
SARS Coronavirus 2 by RT PCR: POSITIVE — AB

## 2022-06-13 MED ORDER — DEXAMETHASONE SODIUM PHOSPHATE 10 MG/ML IJ SOLN
10.0000 mg | Freq: Once | INTRAMUSCULAR | Status: AC
  Administered 2022-06-13: 10 mg via INTRAVENOUS
  Filled 2022-06-13: qty 1

## 2022-06-13 MED ORDER — IPRATROPIUM-ALBUTEROL 0.5-2.5 (3) MG/3ML IN SOLN
3.0000 mL | Freq: Once | RESPIRATORY_TRACT | Status: AC
  Administered 2022-06-13: 3 mL via RESPIRATORY_TRACT
  Filled 2022-06-13: qty 3

## 2022-06-13 MED ORDER — PAXLOVID (300/100) 20 X 150 MG & 10 X 100MG PO TBPK: 3.0000 | ORAL_TABLET | Freq: Two times a day (BID) | ORAL | 0 refills | Status: DC

## 2022-06-13 MED ORDER — ACETAMINOPHEN 325 MG PO TABS: 650.0000 mg | ORAL_TABLET | Freq: Once | ORAL | Status: DC

## 2022-06-13 NOTE — ED Triage Notes (Signed)
Pt here from home with cough and sob , pt has copd and still smokes 1/2 pack day down from 2 packs /day , no fevers

## 2022-06-13 NOTE — Discharge Instructions (Addendum)
You were seen in the emergency department today for shortness of breath.  You have COVID-19.  I am starting you on medication called Paxlovid for this that you will take over the next 5 days.  Please return to the emergency department for worsening symptoms.  Please refer to the CDC.gov as far as isolation and quarantine.  You should not return to work if you are having ongoing fevers.  Please wear a mask around other individuals.

## 2022-06-13 NOTE — ED Notes (Addendum)
O2 after ambulating down the hall settled at 90%. Pt reported felling much better than when first checked in to the hospital.

## 2022-06-13 NOTE — ED Provider Notes (Signed)
[facility] Provider Note   CSN: [id number] Arrival date & time: 06/13/22  1644     History  Chief Complaint  Patient presents with   Shortness of Francisco [name] is a 62 y.o. male. With past medical history of COPD, dilated cardiomyopathy, CAD s/p PCI, HTN, combined systolic/diastolic HF, methamphetamine use who presents to the emergency department with shortness of breath.  States he has had increased shortness of breath since Friday. States he was working outside Friday in the rain, but no other inciting events. He states that since then has had increased sputum production and work of breathing. Endorses palpitations along with his symptoms. He has been using albuterol inhaler with no relief. He denies having fevers or chills at home. Denies lower extremity swelling, sore throat, lightheadedness, sick contacts. He states that he wears O2 PRN at home. He does not have maintenance inhalers for his COPD because they are too expensive. Only using rescue albuterol. Additionally, he states that he is supposed to be taking Lasix BID but takes half the dose. He has been doing this for months.    Shortness of Breath Associated symptoms: cough and wheezing        Home Medications Prior to Admission medications   Medication Sig Start Date End Date Taking? Authorizing Provider  nirmatrelvir & ritonavir (PAXLOVID, 300/100,) 20 x 150 MG & 10 x '100MG'$  TBPK Take 3 tablets by mouth 2 (two) times daily for 5 days. 06/13/22 06/18/22 Yes Mickie Hillier, PA-C  acetaminophen (TYLENOL) 325 MG tablet Take 650 mg by mouth every 6 (six) hours as needed for moderate pain or headache.    [provider]  albuterol (PROVENTIL) (2.5 MG/3ML) 0.083% nebulizer solution Use 1 vial (2.5 mg total) by nebulization every 6 (six) hours as needed for wheezing or shortness of breath. 05/05/22   Delene Ruffini, MD  albuterol (VENTOLIN HFA) 108 (90 Base) MCG/ACT  inhaler Inhale 2 puffs into the lungs every 6 (six) hours as needed for wheezing or shortness of breath 09/25/21   Lacinda Axon, MD  albuterol (VENTOLIN HFA) 108 (90 Base) MCG/ACT inhaler Inhale 2 puffs into the lungs every 6 (six) hours as needed for wheezing or shortness of breath 05/05/22   Delene Ruffini, MD  amLODipine (NORVASC) 5 MG tablet Take 1 tablet (5 mg total) by mouth daily. 05/05/22   Delene Ruffini, MD  aspirin EC 81 MG tablet Take 81 mg by mouth daily. Swallow whole.    [provider]  atorvastatin (LIPITOR) 40 MG tablet Take 1 tablet (40 mg total) by mouth daily. 05/05/22   Delene Ruffini, MD  fluticasone-salmeterol (ADVAIR DISKUS) 500-50 MCG/ACT AEPB Inhale 1 puff into the lungs in the morning and at bedtime. 05/05/22   Delene Ruffini, MD  furosemide (LASIX) 40 MG tablet Take 1 tablet (40 mg total) by mouth daily. 05/05/22   Delene Ruffini, MD  lisinopril (ZESTRIL) 20 MG tablet Take 1 tablet (20 mg total) by mouth daily. 05/05/22   Delene Ruffini, MD  metoprolol succinate (TOPROL-XL) 25 MG 24 hr tablet Take 1 tablet (25 mg total) by mouth in the morning. 05/05/22   Delene Ruffini, MD  nicotine (NICODERM CQ - DOSED IN MG/24 HOURS) 14 mg/24hr patch Place 1 patch (14 mg total) onto the skin daily. Patient not taking: Reported on 08/13/2021 06/24/21   Delene Ruffini, MD  nitroGLYCERIN (NITROSTAT) 0.4 MG SL tablet Place 1 tablet (0.4 mg total) under the  tongue every 5 (five) minutes x 3 doses as needed for chest pain. 04/21/21   Masters, Katie, DO  predniSONE (DELTASONE) 10 MG tablet Take 4 tablets (40 mg total) by mouth daily. 05/24/22   Kommor, Madison, MD  predniSONE (DELTASONE) 50 MG tablet Take 1 tablet (50 mg total) by mouth daily. [id number]   Delora Fuel, MD  tiotropium (SPIRIVA) 18 MCG inhalation capsule Place 1 capsule (18 mcg total) into inhaler and inhale daily. 05/05/22   Delene Ruffini, MD      Allergies    Ivp dye [iodinated contrast media] and  Tramadol    Review of Systems   Review of Systems  Respiratory:  Positive for cough, shortness of breath and wheezing.   Cardiovascular:  Positive for palpitations.  All other systems reviewed and are negative.   Physical Exam Updated Vital Signs BP 132/81   Pulse 94   Temp 99.7 F (37.6 C)   Resp (!) 22   SpO2 97%  Physical Exam Vitals and nursing note reviewed.  Constitutional:      General: He is in acute distress.     Appearance: Normal appearance. He is ill-appearing.  HENT:     Head: Normocephalic.     Mouth/Throat:     Pharynx: Oropharynx is clear.  Eyes:     General: No scleral icterus. Neck:     Vascular: No JVD.  Cardiovascular:     Rate and Rhythm: Normal rate and regular rhythm.     Pulses: Normal pulses.          Radial pulses are 2+ on the right side and 2+ on the left side.     Heart sounds: No murmur heard.    Comments: Trace BLE pitting edema  Pulmonary:     Effort: Tachypnea, prolonged expiration and respiratory distress present.     Breath sounds: Examination of the right-upper field reveals wheezing. Examination of the left-upper field reveals wheezing. Examination of the right-middle field reveals wheezing. Examination of the right-lower field reveals wheezing. Examination of the left-lower field reveals wheezing. Wheezing present.  Chest:     Chest wall: No tenderness.  Abdominal:     General: Bowel sounds are normal.     Palpations: Abdomen is soft.  Musculoskeletal:     Cervical back: Normal range of motion.  Skin:    General: Skin is warm and dry.     Capillary Refill: Capillary refill takes less than 2 seconds.  Neurological:     General: No focal deficit present.     Mental Status: He is alert and oriented to person, place, and time.  Psychiatric:        Mood and Affect: Mood normal.        Behavior: Behavior normal.     ED Results / Procedures / Treatments   Labs (all labs ordered are listed, but only abnormal results are  displayed) Labs Reviewed  RESP PANEL BY RT-PCR (RSV, FLU A&B, COVID)  RVPGX2 - Abnormal; Notable for the following components:      Result Value   SARS Coronavirus 2 by RT PCR POSITIVE (*)    All other components within normal limits  BASIC METABOLIC PANEL - Abnormal; Notable for the following components:   Glucose, Bld 116 (*)    All other components within normal limits  I-STAT VENOUS BLOOD GAS, ED - Abnormal; Notable for the following components:   pH, Ven 7.438 (*)    pCO2, Ven 40.8 (*)    pO2, Ven  29 (*)    Acid-Base Excess 3.0 (*)    Calcium, Ion 1.13 (*)    All other components within normal limits  BRAIN NATRIURETIC PEPTIDE  CBC WITH DIFFERENTIAL/PLATELET  TROPONIN I (HIGH SENSITIVITY)    EKG EKG Interpretation  Date/Time:  Sunday June 13 2022 17:48:15 EST Ventricular Rate:  89 PR Interval:  148 QRS Duration: 83 QT Interval:  348 QTC Calculation: 424 R Axis:   36 Text Interpretation: Sinus rhythm Consider right atrial enlargement Borderline T wave abnormalities when compared to prior, similar appearance. No STEMI Confirmed by Antony Blackbird [phone]) on 06/13/2022 8:05:41 PM  Radiology DG Chest Port 1 View  Result Date: 06/13/2022 CLINICAL DATA:  Shortness of breath EXAM: PORTABLE CHEST 1 VIEW COMPARISON:  04/08/2022 FINDINGS: The heart size and mediastinal contours are within normal limits. Both lungs are clear. The visualized skeletal structures are unremarkable. IMPRESSION: No active disease. Electronically Signed   By: Davina Poke D.O.   On: 06/13/2022 17:42    Procedures Procedures   Medications Ordered in ED Medications  acetaminophen (TYLENOL) tablet 650 mg (has no administration in time range)  ipratropium-albuterol (DUONEB) 0.5-2.5 (3) MG/3ML nebulizer solution 3 mL (3 mLs Nebulization Given 06/13/22 1726)  dexamethasone (DECADRON) injection 10 mg (10 mg Intravenous Given 06/13/22 1757)    ED Course/ Medical Decision Making/ A&P            HEART  Score: 3                Medical Decision Making Amount and/or Complexity of Data Reviewed Labs: ordered. Radiology: ordered.  Risk OTC drugs. Prescription drug management.  Initial Impression and Ddx 62 year old male who presents to the emergency department with shortness of breath.  Patient PMH that increases complexity of ED encounter:  CAD, CHF, COPD, HTN  Differential: ACS, PE, pneumothorax, pleural effusion, cardiac tamponade, aortic dissection, COPD exacerbation, pneumonia, asthma exacerbation, congestive heart failure, viral upper respiratory infection, medication side effect, anaphylaxis, etc.    Interpretation of Diagnostics I independent reviewed and interpreted the labs as followed: No leukocytosis, BMP within normal limits.  BNP within normal limits, troponin 9,, i-STAT venous gas without acidemia or CO2 retention.  COVID-positive  - I independently visualized the following imaging with scope of interpretation limited to determining acute life threatening conditions related to emergency care: Chest x-ray, which revealed no acute cardiopulmonary process.  Patient Reassessment and Ultimate Disposition/Management On my initial exam he is tachypneic, mildly using accessory muscles, diffusely wheezing, O2 sat is 93 to 94% on room air.  Concern for COPD exacerbation.  I have ordered a DuoNeb and steroids.  Will obtain labs including troponin, venous gas, viral panel, BNP.  Will also get a chest x-ray and EKG.  1930: Patient feeling proved after breathing treatment and steroids.  Chest x-ray does not demonstrate a pneumonia.  Not retaining CO2.  His BNP and initial troponin are normal.  He does have COVID which is likely the nidus of his symptoms.  Giving Tylenol for his low-grade fever.  Will obtain ambulatory O2 is that his resting O2 is about 92 to 93% on room air.  Patient ambulatory O2 sat is only 90% on room air.  He did not have any worsening shortness of breath or develop chest  pain or palpitations.  He is feeling much improved after his DuoNeb and steroids earlier.  Continues to have improvement of symptoms without recurrence of shortness of breath or wheezing.  Do not feel that he is having  ACS at this time.  Troponin x 1 is negative and EKG without ischemia or infarction.  Will not obtain a delta troponin since his symptoms have been ongoing for 3 days.  This x-ray does not demonstrate any pneumonia.  BNP is negative so doubt heart failure.  No evidence of pleural effusion or pneumothorax on his chest x-ray.  Symptoms are also inconsistent with tamponade or dissection.  I considered but doubt PE.  His COVID may have exacerbated his COPD, however will treat the active viral infection and see if this improves his symptoms.  Should have PCP follow-up.  He is agreeable to discharge plan.  The patient has been appropriately medically screened and/or stabilized in the ED. I have low suspicion for any other emergent medical condition which would require further screening, evaluation or treatment in the ED or require inpatient management. At time of discharge the patient is hemodynamically stable and in no acute distress. I have discussed work-up results and diagnosis with patient and answered all questions. Patient is agreeable with discharge plan. We discussed strict return precautions for returning to the emergency department and they verbalized understanding.     Patient management required discussion with the following services or consulting groups:  None  Complexity of Problems Addressed Acute illness or injury that poses threat of life of bodily function  Additional Data Reviewed and Analyzed Further history obtained from: Past medical history and medications listed in the EMR, Prior ED visit notes, Recent PCP notes, Care Everywhere, and Prior labs/imaging results  Patient Encounter Risk Assessment Prescriptions, SDOH impact on management, and Consideration of  hospitalization  Final Clinical Impression(s) / ED Diagnoses Final diagnoses:  COVID-19    Rx / DC Orders ED Discharge Orders          Ordered    nirmatrelvir & ritonavir (PAXLOVID, 300/100,) 20 x 150 MG & 10 x '100MG'$  TBPK  2 times daily        06/13/22 2127              Mickie Hillier, PA-C 06/13/22 2131    Tegeler, Gwenyth Allegra, MD 06/13/22 [phone]

## 2022-06-15 ENCOUNTER — Telehealth: Payer: Self-pay

## 2022-06-15 NOTE — Telephone Encounter (Signed)
Pt's calling again about Paxlovid being too expensive. Informed pt we're waiting on the doctor to respond and we had received his call only a few hours ago. Stated he will call back tomorrow.

## 2022-06-15 NOTE — Telephone Encounter (Signed)
RTC to patient message was left on patient's voice mail that the Clinics had returned his call.

## 2022-06-15 NOTE — Telephone Encounter (Signed)
Patient called regarding rx for paxlovid he stated the medication is $1500 which he will not pay please send in alternative medication at which the patient can afford.

## 2022-06-16 ENCOUNTER — Other Ambulatory Visit (HOSPITAL_COMMUNITY): Payer: Self-pay

## 2022-06-16 ENCOUNTER — Other Ambulatory Visit: Payer: Self-pay | Admitting: Internal Medicine

## 2022-06-16 DIAGNOSIS — U071 COVID-19: Secondary | ICD-10-CM

## 2022-06-16 MED ORDER — MOLNUPIRAVIR EUA 200MG CAPSULE
4.0000 | ORAL_CAPSULE | Freq: Two times a day (BID) | ORAL | 0 refills | Status: AC
  Filled 2022-06-16: qty 40, 5d supply, fill #0

## 2022-06-16 NOTE — Progress Notes (Signed)
Pt with COVID infection who presented to the ED. He was sent home with prescription of paxlovid but states it is too expensive costing him $1500. Will change it to molnupiravir for 5 day course given he is within the window. Spoke with [facility] and she states this will be free or $45 at the most.   Idamae Schuller, MD Tillie Rung. [facility] Residency, PGY-2

## 2022-06-30 ENCOUNTER — Other Ambulatory Visit: Payer: Self-pay

## 2022-07-02 ENCOUNTER — Other Ambulatory Visit (HOSPITAL_COMMUNITY): Payer: Self-pay

## 2022-07-02 MED ORDER — ALBUTEROL SULFATE HFA 108 (90 BASE) MCG/ACT IN AERS
2.0000 | INHALATION_SPRAY | Freq: Four times a day (QID) | RESPIRATORY_TRACT | 2 refills | Status: DC | PRN
  Filled 2022-07-02 (×4): qty 6.7, 25d supply, fill #0
  Filled 2022-07-14 – 2022-07-15 (×2): qty 6.7, 25d supply, fill #1
  Filled 2022-08-10: qty 6.7, 25d supply, fill #2

## 2022-07-12 ENCOUNTER — Other Ambulatory Visit (HOSPITAL_COMMUNITY): Payer: Self-pay

## 2022-07-12 ENCOUNTER — Ambulatory Visit (INDEPENDENT_AMBULATORY_CARE_PROVIDER_SITE_OTHER): Payer: No Typology Code available for payment source | Admitting: Internal Medicine

## 2022-07-12 DIAGNOSIS — J449 Chronic obstructive pulmonary disease, unspecified: Secondary | ICD-10-CM

## 2022-07-12 NOTE — Progress Notes (Unsigned)
   CC: ***  HPI:[name] is a 62 y.o. male who presents for evaluation of ***. Please see individual problem based A/P for details.  COPD - maintenance inhalers too expensive. Only uses rescue albuterol inhaler. Most recent exaceration was from covid infection.  Need to get cheaper inhalers. Currently has spiriva, advair disckis and the albuterol. Trelegy? Even dulera if covered. ... perhaps he could use copay cards? Ask Francisco [name]  CHF - only takes half dose lasix.    Depression, PHQ-9: Based on the patients  [place] [place] Visit from 05/05/2022 in [facility]  PHQ-9 Total Score 14      score we have ***.  Past Medical History:  Diagnosis Date   CHF (congestive heart failure) (HCC)    COPD (chronic obstructive pulmonary disease) (HCC)    COPD with acute exacerbation ([place]) 10/21/2021   Coronary artery disease    a. s/p prior PCI.   Hepatitis-C    Hypertension    IV drug abuse ([place])    a. 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   Medically noncompliant    MI (myocardial infarction) ([place])    Tobacco abuse    Review of Systems:   ROS   Physical Exam: There were no vitals filed for this visit.   General: *** HEENT: Conjunctiva nl , antiicteric sclerae, moist mucous membranes, no exudate or erythema Cardiovascular: Normal rate, regular rhythm.  No murmurs, rubs, or gallops Pulmonary : Equal breath sounds, No wheezes, rales, or rhonchi Abdominal: soft, nontender,  bowel sounds present Ext: No edema in lower extremities, no tenderness to palpation of lower extremities.   Assessment & Plan:   See Encounters Tab for problem based charting.  Patient {GC/GE:[id number]::"discussed with","seen with"} Dr. QH:[id number]. Hoffman","Guilloud","Mullen","Narendra","Raines","Vincent","Williams"}

## 2022-07-14 ENCOUNTER — Other Ambulatory Visit (HOSPITAL_COMMUNITY): Payer: Self-pay

## 2022-07-14 ENCOUNTER — Other Ambulatory Visit: Payer: Self-pay

## 2022-07-14 ENCOUNTER — Other Ambulatory Visit: Payer: Self-pay | Admitting: Internal Medicine

## 2022-07-15 ENCOUNTER — Other Ambulatory Visit (HOSPITAL_COMMUNITY): Payer: Self-pay

## 2022-07-24 ENCOUNTER — Encounter (HOSPITAL_COMMUNITY): Payer: Self-pay

## 2022-07-24 ENCOUNTER — Other Ambulatory Visit: Payer: Self-pay

## 2022-07-24 ENCOUNTER — Emergency Department (HOSPITAL_COMMUNITY): Payer: No Typology Code available for payment source

## 2022-07-24 ENCOUNTER — Emergency Department (HOSPITAL_COMMUNITY)
Admission: EM | Admit: 2022-07-24 | Discharge: 2022-07-24 | Disposition: A | Discharge status: Home / Self Care | Payer: No Typology Code available for payment source | Attending: Emergency Medicine | Admitting: Emergency Medicine

## 2022-07-24 DIAGNOSIS — Z79899 Other long term (current) drug therapy: Secondary | ICD-10-CM | POA: Diagnosis not present

## 2022-07-24 DIAGNOSIS — I509 Heart failure, unspecified: Secondary | ICD-10-CM | POA: Diagnosis not present

## 2022-07-24 DIAGNOSIS — M25461 Effusion, right knee: Secondary | ICD-10-CM

## 2022-07-24 DIAGNOSIS — Z7982 Long term (current) use of aspirin: Secondary | ICD-10-CM | POA: Diagnosis not present

## 2022-07-24 DIAGNOSIS — J449 Chronic obstructive pulmonary disease, unspecified: Secondary | ICD-10-CM | POA: Insufficient documentation

## 2022-07-24 DIAGNOSIS — I2510 Atherosclerotic heart disease of native coronary artery without angina pectoris: Secondary | ICD-10-CM | POA: Diagnosis not present

## 2022-07-24 DIAGNOSIS — I110 Hypertensive heart disease with heart failure: Secondary | ICD-10-CM | POA: Insufficient documentation

## 2022-07-24 MED ORDER — NAPROXEN 500 MG PO TABS: 500.0000 mg | ORAL_TABLET | Freq: Two times a day (BID) | ORAL | 0 refills | Status: DC

## 2022-07-24 MED ORDER — NAPROXEN 250 MG PO TABS
500.0000 mg | ORAL_TABLET | Freq: Once | ORAL | Status: AC
  Administered 2022-07-24: 500 mg via ORAL
  Filled 2022-07-24: qty 2

## 2022-07-24 NOTE — ED Triage Notes (Signed)
Pt states he was squatted down and went to stand up and he heard his knee pop two days ago. Pt states since then his knee has been swollen and painful. Pt was able to walk to triage room. Pt has 2+ swelling of right knee, hot to touch

## 2022-07-24 NOTE — Discharge Instructions (Signed)
Your x-rays did not show any signs of fracture.  Follow-up with an orthopedic doctor for further evaluation of your knee injury

## 2022-07-24 NOTE — ED Notes (Signed)
Patient transported to x-ray. ?

## 2022-07-24 NOTE — Progress Notes (Signed)
Orthopedic Tech Progress Note Patient Details:  Francisco [name] 06/01/1960 [id number]  Ortho Devices Type of Ortho Device: Knee Immobilizer Ortho Device/Splint Location: Right knee Ortho Device/Splint Interventions: Application   Post Interventions Patient Tolerated: Well  Genelle Bal Miraj Truss 07/24/2022, 6:51 PM

## 2022-07-24 NOTE — ED Notes (Signed)
Pt back from X-Ray.  

## 2022-07-24 NOTE — ED Provider Notes (Signed)
[facility] EMERGENCY DEPARTMENT AT [facility] Provider Note   CSN: [id number] Arrival date & time: 07/24/22  1719     History  Chief Complaint  Patient presents with   Knee Injury    Francisco [name] is a 62 y.o. male.  HPI   Patient presents to the ED for evaluation of right knee pain and swelling.  Patient has history of COPD hypertension coronary artery disease, myocardial infarction, hepatitis, CHF.  Patient states he was bending down the other day and when he stood up he felt a pop.  Since that time he has noticed swelling in his knee.  It hurts to walk.  He denies any other injuries.  He has not had any fevers or chills.  Home Medications Prior to Admission medications   Medication Sig Start Date End Date Taking? Authorizing Provider  naproxen (NAPROSYN) 500 MG tablet Take 1 tablet (500 mg total) by mouth 2 (two) times daily with a meal. As needed for pain 07/24/22  Yes Linwood Dibbles, MD  acetaminophen (TYLENOL) 325 MG tablet Take 650 mg by mouth every 6 (six) hours as needed for moderate pain or headache.    [provider]  albuterol (PROVENTIL) (2.5 MG/3ML) 0.083% nebulizer solution Use 1 vial (2.5 mg total) by nebulization every 6 (six) hours as needed for wheezing or shortness of breath. 05/05/22   Adron Bene, MD  albuterol (VENTOLIN HFA) 108 (90 Base) MCG/ACT inhaler Inhale 2 puffs into the lungs every 6 (six) hours as needed for wheezing or shortness of breath 09/25/21   Steffanie Rainwater, MD  albuterol (VENTOLIN HFA) 108 (90 Base) MCG/ACT inhaler Inhale 2 puffs into the lungs every 6 (six) hours as needed for wheezing or shortness of breath 07/02/22   Adron Bene, MD  amLODipine (NORVASC) 5 MG tablet Take 1 tablet (5 mg total) by mouth daily. 05/05/22   Adron Bene, MD  aspirin EC 81 MG tablet Take 81 mg by mouth daily. Swallow whole.    [provider]  atorvastatin (LIPITOR) 40 MG tablet Take 1 tablet (40 mg total) by mouth daily.  05/05/22   Adron Bene, MD  fluticasone-salmeterol (ADVAIR DISKUS) 500-50 MCG/ACT AEPB Inhale 1 puff into the lungs in the morning and at bedtime. 05/05/22   Adron Bene, MD  furosemide (LASIX) 40 MG tablet Take 1 tablet (40 mg total) by mouth daily. 05/05/22   Adron Bene, MD  lisinopril (ZESTRIL) 20 MG tablet Take 1 tablet (20 mg total) by mouth daily. 05/05/22   Adron Bene, MD  metoprolol succinate (TOPROL-XL) 25 MG 24 hr tablet Take 1 tablet (25 mg total) by mouth in the morning. 05/05/22   Adron Bene, MD  nicotine (NICODERM CQ - DOSED IN MG/24 HOURS) 14 mg/24hr patch Place 1 patch (14 mg total) onto the skin daily. Patient not taking: Reported on 08/13/2021 06/24/21   Adron Bene, MD  nitroGLYCERIN (NITROSTAT) 0.4 MG SL tablet Place 1 tablet (0.4 mg total) under the tongue every 5 (five) minutes x 3 doses as needed for chest pain. 04/21/21   Masters, Katie, DO  predniSONE (DELTASONE) 10 MG tablet Take 4 tablets (40 mg total) by mouth daily. 05/24/22   Kommor, Madison, MD  predniSONE (DELTASONE) 50 MG tablet Take 1 tablet (50 mg total) by mouth daily. 04/09/22   Dione Booze, MD  tiotropium (SPIRIVA) 18 MCG inhalation capsule Place 1 capsule (18 mcg total) into inhaler and inhale daily. 05/05/22   Adron Bene, MD  Allergies    Ivp dye [iodinated contrast media] and Tramadol    Review of Systems   Review of Systems  Physical Exam Updated Vital Signs BP (!) 161/97 (BP Location: Left Arm)   Pulse 91   Temp (!) 97.5 F (36.4 C)   Resp (!) 22   Ht 1.829 m (6')   Wt 83.9 kg   SpO2 92%   BMI 25.09 kg/m  Physical Exam Vitals and nursing note reviewed.  Constitutional:      Appearance: He is well-developed. He is not diaphoretic.  HENT:     Head: Normocephalic and atraumatic.     Right Ear: External ear normal.     Left Ear: External ear normal.  Eyes:     General: No scleral icterus.       Right eye: No discharge.        Left eye: No discharge.      Conjunctiva/sclera: Conjunctivae normal.  Neck:     Trachea: No tracheal deviation.  Cardiovascular:     Rate and Rhythm: Normal rate.  Pulmonary:     Effort: Pulmonary effort is normal. No respiratory distress.     Breath sounds: No stridor.  Abdominal:     General: There is no distension.  Musculoskeletal:        General: Swelling and tenderness present. No deformity.     Cervical back: Neck supple.     Right knee: Effusion present. No erythema. Tenderness present.  Skin:    General: Skin is warm and dry.     Findings: No rash.  Neurological:     Mental Status: He is alert. Mental status is at baseline.     Cranial Nerves: No dysarthria or facial asymmetry.     Motor: No seizure activity.     ED Results / Procedures / Treatments   Labs (all labs ordered are listed, but only abnormal results are displayed) Labs Reviewed - No data to display  EKG None  Radiology DG Knee Complete 4 Views Right  Result Date: 07/24/2022 CLINICAL DATA:  Injury EXAM: RIGHT KNEE - COMPLETE 4+ VIEW COMPARISON:  Right knee x-ray 12/17/2020 FINDINGS: No evidence of fracture, dislocation, or joint effusion. No evidence of arthropathy or other focal bone abnormality. Peripheral vascular calcifications are present. Soft tissues are otherwise within normal limits. IMPRESSION: Negative. Electronically Signed   By: Darliss Cheney M.D.   On: 07/24/2022 18:04    Procedures Procedures    Medications Ordered in ED Medications - No data to display  ED Course/ Medical Decision Making/ A&P Clinical Course as of 07/24/22 1811  Sat Jul 24, 2022  1808 X-ray does not show any acute abnormality. [JK]    Clinical Course User Index [JK] Linwood Dibbles, MD                             Medical Decision Making Amount and/or Complexity of Data Reviewed Radiology: ordered and independent interpretation performed.   Patient presented to the ED for evaluation of knee injury.  X-rays do not show any signs of  fracture.  Exam and history is not suggestive of infection.  Suspect internal derangement possible ligamentous injury.  Will give patient a knee immobilizer and crutches.  NSAIDs for pain.  Outpatient follow-up with orthopedics.        Final Clinical Impression(s) / ED Diagnoses Final diagnoses:  Effusion of right knee    Rx / DC Orders ED Discharge Orders  Ordered    naproxen (NAPROSYN) 500 MG tablet  2 times daily with meals        07/24/22 1810              Linwood DibblesKnapp, Mariona Scholes, MD 07/24/22 [phone]

## 2022-07-24 NOTE — ED Notes (Signed)
Patient transported to X-ray 

## 2022-08-10 ENCOUNTER — Other Ambulatory Visit (HOSPITAL_COMMUNITY): Payer: Self-pay

## 2022-08-10 ENCOUNTER — Other Ambulatory Visit: Payer: Self-pay | Admitting: Internal Medicine

## 2022-08-11 ENCOUNTER — Other Ambulatory Visit (HOSPITAL_COMMUNITY): Payer: Self-pay

## 2022-08-11 ENCOUNTER — Other Ambulatory Visit: Payer: Self-pay

## 2022-08-11 MED ORDER — ATORVASTATIN CALCIUM 40 MG PO TABS
40.0000 mg | ORAL_TABLET | Freq: Every day | ORAL | 2 refills | Status: DC
  Filled 2022-08-11: qty 30, 30d supply, fill #0
  Filled 2022-09-14 – 2022-09-15 (×3): qty 30, 30d supply, fill #1
  Filled 2022-10-22: qty 30, 30d supply, fill #2

## 2022-08-11 MED ORDER — METOPROLOL SUCCINATE ER 25 MG PO TB24
25.0000 mg | ORAL_TABLET | Freq: Every morning | ORAL | 2 refills | Status: DC
  Filled 2022-08-11: qty 30, 30d supply, fill #0
  Filled 2022-09-14 (×3): qty 30, 30d supply, fill #1
  Filled 2022-10-22: qty 30, 30d supply, fill #2

## 2022-08-11 MED ORDER — AMLODIPINE BESYLATE 5 MG PO TABS
5.0000 mg | ORAL_TABLET | Freq: Every day | ORAL | 2 refills | Status: DC
  Filled 2022-08-11: qty 30, 30d supply, fill #0
  Filled 2022-09-14 – 2022-09-15 (×3): qty 30, 30d supply, fill #1
  Filled 2022-10-22: qty 30, 30d supply, fill #2

## 2022-08-11 MED ORDER — LISINOPRIL 20 MG PO TABS
20.0000 mg | ORAL_TABLET | Freq: Every day | ORAL | 2 refills | Status: DC
  Filled 2022-08-11: qty 30, 30d supply, fill #0

## 2022-08-16 ENCOUNTER — Other Ambulatory Visit (HOSPITAL_COMMUNITY): Payer: Self-pay

## 2022-08-17 ENCOUNTER — Other Ambulatory Visit (HOSPITAL_COMMUNITY): Payer: Self-pay

## 2022-08-23 ENCOUNTER — Other Ambulatory Visit: Payer: Self-pay | Admitting: Internal Medicine

## 2022-08-23 DIAGNOSIS — J449 Chronic obstructive pulmonary disease, unspecified: Secondary | ICD-10-CM

## 2022-08-23 MED ORDER — BREZTRI AEROSPHERE 160-9-4.8 MCG/ACT IN AERO
2.0000 | INHALATION_SPRAY | Freq: Two times a day (BID) | RESPIRATORY_TRACT | 5 refills | Status: DC
Start: 2022-08-23 — End: 2022-09-27
  Filled 2022-08-23: qty 10.7, 30d supply, fill #0

## 2022-08-24 ENCOUNTER — Other Ambulatory Visit: Payer: Self-pay

## 2022-08-24 ENCOUNTER — Other Ambulatory Visit (HOSPITAL_COMMUNITY): Payer: Self-pay

## 2022-08-25 ENCOUNTER — Ambulatory Visit (INDEPENDENT_AMBULATORY_CARE_PROVIDER_SITE_OTHER): Payer: No Typology Code available for payment source | Admitting: Student

## 2022-08-25 ENCOUNTER — Other Ambulatory Visit (HOSPITAL_COMMUNITY): Payer: Self-pay

## 2022-08-25 ENCOUNTER — Encounter: Payer: Self-pay | Admitting: Student

## 2022-08-25 VITALS — BP 150/92 | HR 50 | Ht 72.0 in | Wt 178.9 lb

## 2022-08-25 DIAGNOSIS — M25561 Pain in right knee: Secondary | ICD-10-CM

## 2022-08-25 DIAGNOSIS — J441 Chronic obstructive pulmonary disease with (acute) exacerbation: Secondary | ICD-10-CM | POA: Diagnosis not present

## 2022-08-25 DIAGNOSIS — I5042 Chronic combined systolic (congestive) and diastolic (congestive) heart failure: Secondary | ICD-10-CM

## 2022-08-25 DIAGNOSIS — I110 Hypertensive heart disease with heart failure: Secondary | ICD-10-CM

## 2022-08-25 DIAGNOSIS — I10 Essential (primary) hypertension: Secondary | ICD-10-CM

## 2022-08-25 MED ORDER — PREDNISONE 10 MG PO TABS
40.0000 mg | ORAL_TABLET | Freq: Every day | ORAL | 0 refills | Status: AC
  Filled 2022-08-25: qty 20, 5d supply, fill #0

## 2022-08-25 MED ORDER — ALBUTEROL SULFATE HFA 108 (90 BASE) MCG/ACT IN AERS
2.0000 | INHALATION_SPRAY | RESPIRATORY_TRACT | 2 refills | Status: DC | PRN
  Filled 2022-08-25: qty 6.7, 25d supply, fill #0
  Filled 2022-09-14 (×2): qty 6.7, 30d supply, fill #0
  Filled 2022-09-15: qty 6.7, 25d supply, fill #0

## 2022-08-25 MED ORDER — LISINOPRIL 40 MG PO TABS
20.0000 mg | ORAL_TABLET | Freq: Every day | ORAL | 3 refills | Status: DC
  Filled 2022-08-25 – 2022-09-15 (×4): qty 45, 90d supply, fill #0
  Filled 2022-11-26: qty 45, 90d supply, fill #1
  Filled 2023-03-10: qty 45, 90d supply, fill #2

## 2022-08-25 NOTE — Patient Instructions (Addendum)
It was a pleasure seeing you in clinic today.  Please take prednisone 40 mg for 5 days to help with wheezing  Please call the number to set up a copay card for the breztri inhaler  Start 2 puffs twice daily to help with breathing once you are able to pick this up  You blood pressure is elevated today  Increase lisinopril to 40 mg daily  Follow up in 1 month

## 2022-08-26 DIAGNOSIS — M25561 Pain in right knee: Secondary | ICD-10-CM | POA: Insufficient documentation

## 2022-08-26 NOTE — Progress Notes (Signed)
Established Patient Office Visit  Subjective   Patient ID: Francisco [name], male    DOB: [birth year]  Age: 62 y.o. MRN: [id number]  Chief Complaint  Patient presents with   COPD    Francisco [name] is a 62 y.o. person living with a history listed below who presents to clinic for follow up with clinic. Please refer to problem based charting for further details and assessment and plan of current problem and chronic medical conditions.   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   Cardiovascular:  Positive for orthopnea. Negative for chest pain and leg swelling.  Musculoskeletal:  Positive for joint pain (R knee).  All other systems reviewed and are negative.     Objective:     BP (!) 150/92 (BP Location: Left Arm, Patient Position: Sitting, Cuff Size: Normal)   Pulse (!) 50   Ht 6' (1.829 m)   Wt 178 lb 14.4 oz (81.1 kg)   SpO2 93%   BMI 24.26 kg/m  BP Readings from Last 3 Encounters:  08/25/22 (!) 150/92  07/24/22 (!) 161/97  06/13/22 102/72      Physical Exam Constitutional:      Comments: Chronically ill appearing  HENT:     Mouth/Throat:     Mouth: Mucous membranes are moist.     Pharynx: Oropharynx is clear.  Eyes:     Extraocular Movements: Extraocular movements intact.     Pupils: Pupils are equal, round, and reactive to light.   Cardiovascular:     Rate and Rhythm: Normal rate and regular rhythm.     Heart sounds: No murmur heard.    Comments: No JVD Pulmonary:     Effort: Pulmonary effort is normal.     Breath sounds: Wheezing (diffusiely over bilateral lungs) present.     Comments: No crackles Abdominal:     General: Abdomen is flat. Bowel sounds are normal. There is no distension.     Palpations: Abdomen is soft.     Tenderness: There is no abdominal tenderness.  Musculoskeletal:        General: Normal range of motion.     Right lower leg: No edema.     Left lower leg: No edema.     Comments: Effusion of the superior R knee, no joint laxity of the ACL, PCL, MCL or LCL. TTP of the medial aspect of the knee  Skin:    General: Skin is warm and dry.     Capillary Refill: Capillary refill takes less than 2 seconds.  Neurological:     General: No focal deficit present.     Mental Status: He is alert and oriented to person, place, and time.  Psychiatric:        Mood and Affect: Mood normal.        Behavior: Behavior normal.  No results found for any visits on 08/25/22.  Last metabolic panel Lab Results  Component Value Date   GLUCOSE 116 (H) 06/13/2022   NA 137 06/13/2022   K 4.3 06/13/2022   CL 99 06/13/2022   CO2 27 06/13/2022   BUN 12 06/13/2022   CREATININE 0.98 06/13/2022   GFRNONAA >60 06/13/2022   CALCIUM 9.5 06/13/2022   PHOS 3.0 01/16/2021   PROT 6.6 02/19/2022   ALBUMIN 3.7 02/19/2022   BILITOT 0.7 02/19/2022   ALKPHOS 61 02/19/2022   AST 38 02/19/2022   ALT 64 (H) 02/19/2022   ANIONGAP 9 06/13/2022      The ASCVD Risk score (Arnett DK, et al., 2019) failed to calculate for the following reasons:   The patient has a prior MI or stroke diagnosis    Assessment & Plan:   Problem List Items Addressed This Visit     COPD exacerbation (HCC) (Chronic)    Poorly controlled COPD with significant bilateral wheezing.  O2 sats stable in the low 90s which is his baseline.  Is  using albuterol numerous times during the day without much improvement.  Appears we have sent in [facility] which has a $300 co-pay.  I discussed this with [name].  There is a co-pay card held versus payments to $0.  Gave him information to complete co-pay card.  Will have him call us if he is unable to pick up the [facility].  Will give him a short course of prednisone giving significant wheezing on exam until he is able to pick up a new inhaler.  Prednisone 40 mg daily for 5 days Start Breztri 2 puffs twice daily Albuterol as needed Follow-up in 1 month.      Relevant Medications   predniSONE (DELTASONE) 10 MG tablet   albuterol (VENTOLIN HFA) 108 (90 Base) MCG/ACT inhaler   Hypertension (Chronic)    BP remains elevated at 154/90 and 150/92 on repeat.  He is taking lisinopril 20 mg daily.  Asymptomatic from his hypertension.  Will increase his lisinopril to 40 mg daily.      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  Reports difficulty with timing the medication as he cannot go to the bathroom frequently during work.  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   No significant lower extremity edema or JVD.  Overall appears fairly euvolemic today.  He does describe mild orthopnea that is not been worsening. Recommend he try taking furosemide near end of workday.       Relevant Medications   lisinopril (ZESTRIL) 40 MG tablet   Right knee pain - Primary    ED visit on 4/6 after hearing a popping sensation in his right knee after standing from squatting down.  X-ray without acute abnormality.  Did describe significant swelling of the knee.  And discharged with knee immobilizer and crutches due to concern for ligamentous injury.  He did not follow-up with orthopedics.  Continues to have mild  swelling and pain with standing of his leg.  However he has been able to walk and go to work.  On exam there is effusion of the superior aspect of the knee and pain to palpation of the medial aspect of the knee.  Unable to elicit joint laxity.  Given continued  swelling and pain with mobility of the knee recommended further orthopedic suspect he may have a partial tear of his medial knee ligaments.  Pain control with Tylenol, Advil, Voltaren gel Follow-up with orthopedic      Relevant Orders   Ambulatory referral to Orthopedics    Return in about 4 weeks (around 09/22/2022).    Quincy Simmonds, MD

## 2022-08-26 NOTE — Assessment & Plan Note (Signed)
BP remains elevated at 154/90 and 150/92 on repeat.  He is taking lisinopril 20 mg daily.  Asymptomatic from his hypertension.  Will increase his lisinopril to 40 mg daily.

## 2022-08-26 NOTE — Progress Notes (Signed)
Internal Medicine Clinic Attending ? ?Case discussed with Dr. Liang  At the time of the visit.  We reviewed the resident?s history and exam and pertinent patient test results.  I agree with the assessment, diagnosis, and plan of care documented in the resident?s note. ? ?

## 2022-08-26 NOTE — Assessment & Plan Note (Signed)
Poorly controlled COPD with significant bilateral wheezing.  O2 sats stable in the low 90s which is his baseline.  Is using albuterol numerous times during the day without much improvement.  Appears we have sent in [facility] which has a $300 co-pay.  I discussed this with [name].  There is a co-pay card held versus payments to $0.  Gave him information to complete co-pay card.  Will have him call us if he is unable to pick up the [facility].  Will give him a short course of prednisone giving significant wheezing on exam until he is able to pick up a new inhaler.  Prednisone 40 mg daily for 5 days Start Breztri 2 puffs twice daily Albuterol as needed Follow-up in 1 month.

## 2022-08-26 NOTE — Assessment & Plan Note (Addendum)
Only taking furosemide on weekends.  Reports difficulty with timing the medication as he cannot go to the bathroom frequently during work.  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   No significant lower extremity edema or JVD.  Overall appears fairly euvolemic today.  He does describe mild orthopnea that is not been worsening. Recommend he try taking furosemide near end of workday.

## 2022-08-26 NOTE — Assessment & Plan Note (Signed)
>>  ASSESSMENT AND PLAN FOR COPD (CHRONIC OBSTRUCTIVE PULMONARY DISEASE) (HCC) WRITTEN ON 08/26/2022 10:43 AM BY LIANG, JESSICA, MD  Poorly controlled COPD with significant bilateral wheezing.  O2 sats stable in the low 90s which is his baseline.  Is using albuterol  numerous times during the day without much improvement.  Appears we have sent in Breztri  which has a $300 co-pay.  I discussed this with Lavern.  There is a co-pay card held versus payments to $0.  Gave him information to complete co-pay card.  Will have him call us  if he is unable to pick up the Breztri .  Will give him a short course of prednisone  giving significant wheezing on exam until he is able to pick up a new inhaler.  Prednisone  40 mg daily for 5 days Start Breztri  2 puffs twice daily Albuterol  as needed Follow-up in 1 month.

## 2022-08-26 NOTE — Assessment & Plan Note (Addendum)
ED visit on 4/6 after hearing a popping sensation in his right knee after standing from squatting down.  X-ray without acute abnormality.  Did describe significant swelling of the knee.  And discharged with knee immobilizer and crutches due to concern for ligamentous injury.  He did not follow-up with orthopedics.  Continues to have mild swelling and pain with standing of his leg.  However he has been able to walk and go to work.  On exam there is effusion of the superior aspect of the knee and pain to palpation of the medial aspect of the knee.  Unable to elicit joint laxity.  Given continued swelling and pain with mobility of the knee recommended further orthopedics, suspect he may have a partial tear of his medial knee ligaments.  Pain control with Tylenol, Advil, Voltaren gel Referral to orthopedics

## 2022-08-27 LAB — BMP8+ANION GAP
Anion Gap: 15 mmol/L (ref 10.0–18.0)
BUN/Creatinine Ratio: 16 (ref 10–24)
BUN: 16 mg/dL (ref 8–27)
CO2: 23 mmol/L (ref 20–29)
Calcium: 9 mg/dL (ref 8.6–10.2)
Chloride: 105 mmol/L (ref 96–106)
Creatinine, Ser: 0.97 mg/dL (ref 0.76–1.27)
Glucose: 80 mg/dL (ref 70–99)
Potassium: 4.1 mmol/L (ref 3.5–5.2)
Sodium: 143 mmol/L (ref 134–144)
eGFR: 89 mL/min/{1.73_m2} (ref >59)

## 2022-09-06 ENCOUNTER — Other Ambulatory Visit (HOSPITAL_COMMUNITY): Payer: Self-pay

## 2022-09-08 ENCOUNTER — Other Ambulatory Visit (HOSPITAL_COMMUNITY): Payer: Self-pay

## 2022-09-08 ENCOUNTER — Encounter (HOSPITAL_COMMUNITY): Payer: Self-pay

## 2022-09-08 ENCOUNTER — Emergency Department (HOSPITAL_COMMUNITY): Payer: No Typology Code available for payment source

## 2022-09-08 ENCOUNTER — Other Ambulatory Visit: Payer: Self-pay

## 2022-09-08 ENCOUNTER — Emergency Department (HOSPITAL_COMMUNITY)
Admission: EM | Admit: 2022-09-08 | Discharge: 2022-09-08 | Disposition: A | Discharge status: Home / Self Care | Payer: No Typology Code available for payment source | Attending: Student | Admitting: Student

## 2022-09-08 DIAGNOSIS — J449 Chronic obstructive pulmonary disease, unspecified: Secondary | ICD-10-CM | POA: Diagnosis not present

## 2022-09-08 DIAGNOSIS — Z7982 Long term (current) use of aspirin: Secondary | ICD-10-CM | POA: Insufficient documentation

## 2022-09-08 DIAGNOSIS — I110 Hypertensive heart disease with heart failure: Secondary | ICD-10-CM | POA: Diagnosis not present

## 2022-09-08 DIAGNOSIS — Z79899 Other long term (current) drug therapy: Secondary | ICD-10-CM | POA: Diagnosis not present

## 2022-09-08 DIAGNOSIS — F17210 Nicotine dependence, cigarettes, uncomplicated: Secondary | ICD-10-CM | POA: Insufficient documentation

## 2022-09-08 DIAGNOSIS — I2510 Atherosclerotic heart disease of native coronary artery without angina pectoris: Secondary | ICD-10-CM | POA: Insufficient documentation

## 2022-09-08 DIAGNOSIS — J441 Chronic obstructive pulmonary disease with (acute) exacerbation: Secondary | ICD-10-CM

## 2022-09-08 DIAGNOSIS — I5040 Unspecified combined systolic (congestive) and diastolic (congestive) heart failure: Secondary | ICD-10-CM | POA: Diagnosis not present

## 2022-09-08 DIAGNOSIS — R0602 Shortness of breath: Secondary | ICD-10-CM | POA: Diagnosis present

## 2022-09-08 DIAGNOSIS — L0390 Cellulitis, unspecified: Secondary | ICD-10-CM

## 2022-09-08 LAB — CBC WITH DIFFERENTIAL/PLATELET
Abs Immature Granulocytes: 0.04 10*3/uL (ref 0.00–0.07)
Basophils Absolute: 0.1 10*3/uL (ref 0.0–0.1)
Basophils Relative: 1 %
Eosinophils Absolute: 0.1 10*3/uL (ref 0.0–0.5)
Eosinophils Relative: 1 %
HCT: 43.9 % (ref 39.0–52.0)
Hemoglobin: 14.9 g/dL (ref 13.0–17.0)
Immature Granulocytes: 0 %
Lymphocytes Relative: 24 %
Lymphs Abs: 2.5 10*3/uL (ref 0.7–4.0)
MCH: 31.9 pg (ref 26.0–34.0)
MCHC: 33.9 g/dL (ref 30.0–36.0)
MCV: 94 fL (ref 80.0–100.0)
Monocytes Absolute: 1.1 10*3/uL — ABNORMAL HIGH (ref 0.1–1.0)
Monocytes Relative: 11 %
Neutro Abs: 6.7 10*3/uL (ref 1.7–7.7)
Neutrophils Relative %: 63 %
Platelets: 201 10*3/uL (ref 150–400)
RBC: 4.67 MIL/uL (ref 4.22–5.81)
RDW: 13.6 % (ref 11.5–15.5)
WBC: 10.5 10*3/uL (ref 4.0–10.5)
nRBC: 0 % (ref 0.0–0.2)

## 2022-09-08 LAB — COMPREHENSIVE METABOLIC PANEL
Albumin: 3.3 g/dL — ABNORMAL LOW (ref 3.5–5.0)
Anion gap: 9 (ref 5–15)
BUN: 16 mg/dL (ref 8–23)
CO2: 27 mmol/L (ref 22–32)
Calcium: 8.6 mg/dL — ABNORMAL LOW (ref 8.9–10.3)
Chloride: 101 mmol/L (ref 98–111)
Creatinine, Ser: 1.24 mg/dL (ref 0.61–1.24)
GFR, Estimated: >60 mL/min (ref >60)
Glucose, Bld: 104 mg/dL — ABNORMAL HIGH (ref 70–99)
Sodium: 137 mmol/L (ref 135–145)
Total Protein: 6.1 g/dL — ABNORMAL LOW (ref 6.5–8.1)

## 2022-09-08 LAB — TROPONIN I (HIGH SENSITIVITY)
Troponin I (High Sensitivity): 80 ng/L — ABNORMAL HIGH (ref <18)
Troponin I (High Sensitivity): 68 ng/L — ABNORMAL HIGH (ref <18)

## 2022-09-08 LAB — BRAIN NATRIURETIC PEPTIDE: B Natriuretic Peptide: 139.5 pg/mL — ABNORMAL HIGH (ref 0.0–100.0)

## 2022-09-08 MED ORDER — SULFAMETHOXAZOLE-TRIMETHOPRIM 800-160 MG PO TABS
1.0000 | ORAL_TABLET | Freq: Two times a day (BID) | ORAL | 0 refills | Status: AC
  Filled 2022-09-08 (×2): qty 14, 7d supply, fill #0

## 2022-09-08 MED ORDER — METHYLPREDNISOLONE SODIUM SUCC 125 MG IJ SOLR
125.0000 mg | Freq: Once | INTRAMUSCULAR | Status: AC
  Administered 2022-09-08: 125 mg via INTRAVENOUS
  Filled 2022-09-08: qty 2

## 2022-09-08 MED ORDER — PREDNISONE 10 MG PO TABS
40.0000 mg | ORAL_TABLET | Freq: Every day | ORAL | 0 refills | Status: AC
  Filled 2022-09-08 (×2): qty 16, 4d supply, fill #0

## 2022-09-08 MED ORDER — SULFAMETHOXAZOLE-TRIMETHOPRIM 800-160 MG PO TABS
1.0000 | ORAL_TABLET | Freq: Once | ORAL | Status: AC
  Administered 2022-09-08: 1 via ORAL
  Filled 2022-09-08: qty 1

## 2022-09-08 MED ORDER — IPRATROPIUM-ALBUTEROL 0.5-2.5 (3) MG/3ML IN SOLN
9.0000 mL | Freq: Once | RESPIRATORY_TRACT | Status: AC
  Administered 2022-09-08: 9 mL via RESPIRATORY_TRACT
  Filled 2022-09-08: qty 3

## 2022-09-08 NOTE — ED Triage Notes (Signed)
Pt came in via POV d/t SOB that worsened the last few days & it made him feel faint. Does have Hx of CHF, COPD, denies CP.

## 2022-09-08 NOTE — ED Provider Notes (Signed)
[place] EMERGENCY DEPARTMENT AT [facility] Provider Note  CSN: [id number] Arrival date & time: 09/08/22 0908  Chief Complaint(s) Shortness of Breath  HPI Francisco [name] is a 62 y.o. male with PMH CHF with last EF 45%, COPD, CAD status post MI, polysubstance abuse who presents emergency room for evaluation of shortness of breath.  Patient states that he is short of breath at baseline but it has significantly worsened over the last few days.  He is unsure if this is his COPD or his CHF but has been using his albuterol inhaler and nebulizers at home without improvement.  Patient arrives in no significant distress and saturating 94% on room air with no accessory muscle use.  Denies chest pain, abdominal pain, nausea, vomiting or other systemic symptoms.  Of note, patient also endorsing right forearm redness and swelling at the site of where he injects methamphetamine.   Past Medical History Past Medical History:  Diagnosis Date   CHF (congestive heart failure) (HCC)    COPD (chronic obstructive pulmonary disease) (HCC)    COPD with acute exacerbation (HCC) 10/21/2021   Coronary artery disease    a. s/p prior PCI.   Hepatitis-C    Hypertension    IV drug abuse (HCC)    a. 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 Authorizing Provider  acetaminophen (TYLENOL) 325 MG tablet Take 650 mg by mouth every 6 (six) hours as needed for moderate pain or headache.    [provider]  albuterol (PROVENTIL) (2.5 MG/3ML) 0.083% nebulizer solution Use 1 vial (2.5 mg total) by nebulization every 6 (six) hours as needed for wheezing or shortness of breath. 05/05/22   Adron Bene, MD  albuterol (VENTOLIN HFA) 108 (90 Base) MCG/ACT inhaler Inhale 2 puffs into the lungs every 6 (six) hours as needed for wheezing or shortness of breath 09/25/21   Steffanie Rainwater, MD  albuterol (VENTOLIN HFA) 108 (90 Base) MCG/ACT inhaler Inhale 2 puffs into the lungs every 4 (four) hours as needed for wheezing or shortness of breath. 08/25/22   Quincy Simmonds, MD  amLODipine (NORVASC) 5 MG tablet Take 1 tablet (5 mg total) by mouth daily. 08/11/22   Adron Bene, MD  aspirin EC 81 MG tablet Take 81 mg by mouth daily. Swallow whole.    [provider]  atorvastatin (LIPITOR) 40 MG tablet Take 1 tablet (40 mg total) by mouth daily. 08/11/22   Adron Bene, MD  Budeson-Glycopyrrol-Formoterol (BREZTRI AEROSPHERE) 160-9-4.8 MCG/ACT AERO Inhale 2 puffs into the lungs in the morning and at bedtime. 08/23/22   Miguel Aschoff, MD  furosemide (LASIX) 40 MG tablet Take 1 tablet (40 mg total) by mouth daily. 05/05/22   Adron Bene, MD  lisinopril (ZESTRIL) 40 MG tablet Take 1/2 tablet (20 mg total) by mouth daily. 08/25/22   Quincy Simmonds, MD  metoprolol succinate (TOPROL-XL) 25 MG 24 hr tablet Take 1 tablet (25 mg total) by mouth in the morning. 08/11/22   Adron Bene, MD  naproxen (NAPROSYN) 500 MG tablet Take 1 tablet (500 mg total) by mouth 2 (two) times daily with a meal. As needed for pain 07/24/22   Linwood Dibbles, MD  nicotine (NICODERM CQ - DOSED IN MG/24 HOURS) 14 mg/24hr patch Place 1 patch (14 mg total) onto the skin  daily. Patient not taking: Reported on 08/13/2021 06/24/21   Adron Bene, MD  nitroGLYCERIN (NITROSTAT) 0.4 MG SL tablet Place 1 tablet (0.4 mg total) under the tongue every 5 (five) minutes x 3 doses as needed for chest pain. 04/21/21   Masters, Katie, DO  tiotropium (SPIRIVA) 18 MCG inhalation capsule Place 1 capsule (18 mcg total) into inhaler and inhale daily. 05/05/22   Adron Bene, MD                                                                                                                                    Past Surgical History Past Surgical History:  Procedure Laterality Date   BACK SURGERY     NECK SURGERY     RIGHT/LEFT HEART CATH AND CORONARY ANGIOGRAPHY N/A 04/30/2019   Procedure: RIGHT/LEFT HEART CATH AND CORONARY ANGIOGRAPHY;  Surgeon: Marykay Lex, MD;  Location: [facility] INVASIVE CV LAB;  Service: Cardiovascular;  Laterality: N/A;   stints     Family History Family History  Problem Relation Age of Onset   Rheum arthritis Mother    Heart disease Paternal Grandfather     Social History Social History   Tobacco Use   Smoking status: Every Day    Packs/day: .5    Types: Cigarettes   Smokeless tobacco: Never  Vaping Use   Vaping Use: Never used  Substance Use Topics   Alcohol use: Never   Drug use: Yes    Types: Methamphetamines   Allergies Ivp dye [iodinated contrast media] and Tramadol  Review of Systems Review of Systems  Respiratory:  Positive for cough, chest tightness, shortness of breath and wheezing.     Physical Exam Vital Signs  I have reviewed the triage vital signs BP 110/68 (BP Location: Left Arm)   Pulse 94   Temp 97.6 F (36.4 C) (Oral)   Resp (!) 22   SpO2 94%   Physical Exam Constitutional:      General: He is not in acute distress.    Appearance: Normal appearance.  HENT:     Head: Normocephalic and atraumatic.     Nose: No congestion or rhinorrhea.  Eyes:     General:        Right eye: No discharge.        Left  eye: No discharge.     Extraocular  Movements: Extraocular movements intact.     Pupils: Pupils are equal, round, and reactive to light.  Cardiovascular:     Rate and Rhythm: Normal rate and regular rhythm.     Heart sounds: No murmur heard. Pulmonary:     Effort: No respiratory distress.     Breath sounds: Wheezing present. No rales.  Abdominal:     General: There is no distension.     Tenderness: There is no abdominal tenderness.  Musculoskeletal:        General: Normal range of motion.     Cervical back: Normal range of motion.  Skin:    General: Skin is warm and dry.  Neurological:     General: No focal deficit present.     Mental Status: He is alert.     ED Results and Treatments Labs (all labs ordered are listed, but only abnormal results are displayed) Labs Reviewed  COMPREHENSIVE METABOLIC PANEL  CBC WITH DIFFERENTIAL/PLATELET  BRAIN NATRIURETIC PEPTIDE  BLOOD GAS, VENOUS  TROPONIN I (HIGH SENSITIVITY)                                                                                                                          Radiology DG Chest 2 View  Result Date: 09/08/2022 CLINICAL DATA:  Dyspnea.  History of COPD and CHF. EXAM: CHEST - 2 VIEW COMPARISON:  Chest radiographs 06/13/2022 and 04/08/2022 FINDINGS: Cardiac silhouette and mediastinal contours are within normal limits. Mild linear likely scarring overlying the posteroinferior phrenic angle on lateral view is unchanged from 04/08/2022. No acute airspace opacity. No pulmonary edema. No pleural effusion or pneumothorax. Mild anterior wedging of two adjacent midthoracic vertebral bodies, chronic. IMPRESSION: No active cardiopulmonary disease. Electronically Signed   By: Neita Garnet M.D.   On: 09/08/2022 09:50    Pertinent labs & imaging results that were available during my care of the patient were reviewed by me and considered in my medical decision making (see MDM for details).  Medications Ordered in  ED Medications  methylPREDNISolone sodium succinate (SOLU-MEDROL) 125 mg/2 mL injection 125 mg (has no administration in time range)  ipratropium-albuterol (DUONEB) 0.5-2.5 (3) MG/3ML nebulizer solution 9 mL (9 mLs Nebulization Given 09/08/22 0946)  Procedures .Critical Care  Performed by: Glendora Score, MD Authorized by: Glendora Score, MD   Critical care provider statement:    Critical care time (minutes):  30   Critical care was necessary to treat or prevent imminent or life-threatening deterioration of the following conditions:  Respiratory failure   Critical care was time spent personally by me on the following activities:  Development of treatment plan with patient or surrogate, discussions with consultants, evaluation of patient's response to treatment, examination of patient, ordering and review of laboratory studies, ordering and review of radiographic studies, ordering and performing treatments and interventions, pulse oximetry, re-evaluation of patient's condition and review of old charts   (including critical care time)  Medical Decision Making / ED Course   This patient presents to the ED for concern of shortness of breath, arm pain and swelling, this involves an extensive number of treatment options, and is a complaint that carries with it a high risk of complications and morbidity.  The differential diagnosis includes cellulitis, retained injection needle, COPD exacerbation, CHF exacerbation, pneumonia, endocarditis  MDM: Patient seen emergency room for evaluation of shortness of breath, arm pain and swelling.  Physical exam reveals tender erythema over the right forearm, wheezing bilaterally but no evidence of lower extremity edema.  Laboratory evaluation with a mildly elevated BNP at 139.5, initial high-sensitivity troponin is 80 but delta  troponin downtrending at 68 likely type II demand ischemia.  We had significant difficulty with hemolyzed specimens requiring multiple repeat blood draws potassium measurements were unreliable but patient's creatinine is stable.  Chest x-ray unremarkable with no evidence of pulmonary edema or pleural effusion.  He received 3 DuoNebs and methylprednisolone and on reevaluation his shortness of breath has significantly improved.  X-ray of the forearm showing no retained needles and patient does appear to be developing a cellulitis and thus he was placed on Bactrim.  At this time with symptoms improved, he does not meet inpatient criteria for admission and is safe for discharge with outpatient follow-up.  He will be discharged on prednisone for his COPD and Bactrim for developing cellulitis.  Prior to discharge, I did counsel the patient on methamphetamine use cessation.   Additional history obtained:  -External records from outside source obtained and reviewed including: Chart review including previous notes, labs, imaging, consultation notes   Lab Tests: -I ordered, reviewed, and interpreted labs.   The pertinent results include:   Labs Reviewed  COMPREHENSIVE METABOLIC PANEL  CBC WITH DIFFERENTIAL/PLATELET  BRAIN NATRIURETIC PEPTIDE  BLOOD GAS, VENOUS  TROPONIN I (HIGH SENSITIVITY)      EKG   EKG Interpretation  Date/Time:  Wednesday Sep 08 2022 09:22:53 EDT Ventricular Rate:  91 PR Interval:  156 QRS Duration: 85 QT Interval:  349 QTC Calculation: 430 R Axis:   61 Text Interpretation: Sinus tachycardia Multiple ventricular premature complexes Right atrial enlargement Confirmed by Wessley Emert ([id number]) on 09/08/2022 10:14:49 AM         Imaging Studies ordered: I ordered imaging studies including chest x-ray, forearm x-ray I independently visualized and interpreted imaging. I agree with the radiologist interpretation   Medicines ordered and prescription drug management: Meds  ordered this encounter  Medications   ipratropium-albuterol (DUONEB) 0.5-2.5 (3) MG/3ML nebulizer solution 9 mL   methylPREDNISolone sodium succinate (SOLU-MEDROL) 125 mg/2 mL injection 125 mg    -I have reviewed the patients home medicines and have made adjustments as needed  Critical interventions Multiple DuoNebs, steroids    Cardiac Monitoring: The patient was  maintained on a cardiac monitor.  I personally viewed and interpreted the cardiac monitored which showed an underlying rhythm of: NSR, sinus tachycardia  Social Determinants of Health:  Factors impacting patients care include: Active methamphetamine use   Reevaluation: After the interventions noted above, I reevaluated the patient and found that they have :improved  Co morbidities that complicate the patient evaluation  Past Medical History:  Diagnosis Date   CHF (congestive heart failure) (HCC)    COPD (chronic obstructive pulmonary disease) (HCC)    COPD with acute exacerbation (HCC) 10/21/2021   Coronary artery disease    a. s/p prior PCI.   Hepatitis-C    Hypertension    IV drug abuse (HCC)    a. 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   Medically noncompliant    MI (myocardial infarction) (HCC)    Tobacco abuse       Dispostion: I considered admission for this patient, but at this time he does not meet inpatient criteria for admission and he is safe for discharge with outpatient follow-up     Final Clinical Impression(s) / ED Diagnoses Final diagnoses:  None     @PCDICTATION @    Glendora Score, MD 09/08/22 1725

## 2022-09-08 NOTE — ED Notes (Signed)
Lab called and states 2nd CMP an Trop clotted. Advised that second Trop was just sent up and to use it for the initial trop and cmp.

## 2022-09-08 NOTE — ED Notes (Signed)
Pt in xray

## 2022-09-08 NOTE — ED Notes (Signed)
Second troponin collected and sent to lab.

## 2022-09-09 ENCOUNTER — Other Ambulatory Visit (HOSPITAL_COMMUNITY): Payer: Self-pay

## 2022-09-14 ENCOUNTER — Other Ambulatory Visit: Payer: Self-pay

## 2022-09-14 ENCOUNTER — Other Ambulatory Visit (HOSPITAL_COMMUNITY): Payer: Self-pay

## 2022-09-15 ENCOUNTER — Other Ambulatory Visit (HOSPITAL_COMMUNITY): Payer: Self-pay

## 2022-09-16 ENCOUNTER — Other Ambulatory Visit (HOSPITAL_COMMUNITY): Payer: Self-pay

## 2022-09-16 ENCOUNTER — Other Ambulatory Visit: Payer: Self-pay

## 2022-09-21 ENCOUNTER — Other Ambulatory Visit (HOSPITAL_COMMUNITY): Payer: Self-pay

## 2022-09-22 ENCOUNTER — Encounter: Payer: Self-pay | Admitting: *Deleted

## 2022-09-27 ENCOUNTER — Other Ambulatory Visit: Payer: Self-pay

## 2022-09-27 ENCOUNTER — Encounter: Payer: Self-pay | Admitting: Student

## 2022-09-27 ENCOUNTER — Ambulatory Visit: Payer: No Typology Code available for payment source | Admitting: Student

## 2022-09-27 ENCOUNTER — Other Ambulatory Visit (HOSPITAL_COMMUNITY): Payer: Self-pay

## 2022-09-27 VITALS — BP 126/73 | HR 88 | Temp 97.5°F | Ht 72.0 in | Wt 172.6 lb

## 2022-09-27 DIAGNOSIS — J441 Chronic obstructive pulmonary disease with (acute) exacerbation: Secondary | ICD-10-CM

## 2022-09-27 DIAGNOSIS — R5383 Other fatigue: Secondary | ICD-10-CM

## 2022-09-27 DIAGNOSIS — J449 Chronic obstructive pulmonary disease, unspecified: Secondary | ICD-10-CM

## 2022-09-27 MED ORDER — ALBUTEROL SULFATE HFA 108 (90 BASE) MCG/ACT IN AERS
2.0000 | INHALATION_SPRAY | RESPIRATORY_TRACT | 2 refills | Status: DC | PRN
  Filled 2022-09-27: qty 6.7, 16d supply, fill #0
  Filled 2022-10-14: qty 6.7, 16d supply, fill #1
  Filled 2022-10-22 – 2022-10-26 (×2): qty 6.7, 16d supply, fill #2

## 2022-09-27 MED ORDER — TRELEGY ELLIPTA 100-62.5-25 MCG/ACT IN AEPB
INHALATION_SPRAY | RESPIRATORY_TRACT | 0 refills | Status: DC
  Filled 2022-09-27: qty 28, fill #0

## 2022-09-27 MED ORDER — TRELEGY ELLIPTA 100-62.5-25 MCG/ACT IN AEPB: INHALATION_SPRAY | RESPIRATORY_TRACT | 3 refills | Status: DC

## 2022-09-27 NOTE — Patient Instructions (Signed)
Thank you so much for coming to the clinic today!   Today, I am sending you a prescription for an inhaler called Trelegy, please use the coupon for it as well. If you do get approved for Breztri please let me know, and we can switch if we need to. I also refilled your albuterol. I am also sending you for the pulmonary function tests we discussed.   If you have any questions please feel free to the call the clinic at anytime at [phone]. It was a pleasure seeing you!  Best, Dr. Thomasene Ripple

## 2022-09-28 DIAGNOSIS — R5383 Other fatigue: Secondary | ICD-10-CM | POA: Insufficient documentation

## 2022-09-28 NOTE — Progress Notes (Signed)
CC: COPD Follow up  HPI:  [name] is a 62 y.o. male living with a history stated below and presents today for COPD follow up. Please see problem based assessment and plan for additional details.  Past Medical History:  Diagnosis Date   CHF (congestive heart failure) (HCC)    COPD (chronic obstructive pulmonary disease) (HCC)    COPD with acute exacerbation (HCC) 10/21/2021   Coronary artery disease    a. s/p prior PCI.   Hepatitis-C    Hypertension    IV drug abuse (HCC)    a. 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   Medically noncompliant    MI (myocardial infarction) (HCC)    Tobacco abuse     Current Outpatient Medications on File Prior to Visit  Medication Sig Dispense Refill   acetaminophen (TYLENOL) 325 MG tablet Take 650 mg by mouth every 6 (six) hours as needed for moderate pain or headache.     albuterol (PROVENTIL) (2.5 MG/3ML) 0.083% nebulizer solution Use 1 vial (2.5 mg total) by nebulization every 6 (six) hours as needed for wheezing or shortness of breath. 300 mL 2   amLODipine (NORVASC) 5 MG tablet Take 1 tablet (5 mg total) by mouth daily. 30 tablet 2   aspirin EC 81 MG tablet Take 81 mg by mouth daily. Swallow whole.     atorvastatin (LIPITOR) 40 MG tablet Take 1 tablet (40 mg total) by mouth daily. 30 tablet 2   furosemide (LASIX) 40 MG tablet Take 1 tablet (40 mg total) by mouth daily. 30 tablet 0   lisinopril (ZESTRIL) 40 MG tablet Take 1/2 tablet (20 mg total) by mouth daily. 90 tablet 3   metoprolol succinate (TOPROL-XL) 25 MG 24 hr tablet Take 1 tablet (25 mg total) by mouth in the morning. 30 tablet 2   nicotine (NICODERM CQ - DOSED IN MG/24 HOURS) 14 mg/24hr patch Place 1 patch (14 mg total) onto the skin daily. (Patient not taking: Reported on 08/13/2021) 30 patch 1   nitroGLYCERIN (NITROSTAT) 0.4 MG SL tablet Place 1 tablet (0.4 mg total) under the tongue every 5 (five) minutes x 3 doses as needed for chest pain. 25 tablet 2   No  current facility-administered medications on file prior to visit.    Family History  Problem Relation Age of Onset   Rheum arthritis Mother    Heart disease Paternal Grandfather     Social History   Socioeconomic History   Marital status: Divorced    Spouse name: Not on file   Number of children: Not on file   Years of education: Not on file   Highest education level: Not on file  Occupational History   Not on file  Tobacco Use   Smoking status: Every Day    Packs/day: .5    Types: Cigarettes   Smokeless tobacco: Never  Vaping Use   Vaping Use: Never used  Substance and Sexual Activity   Alcohol use: Never   Drug use: Yes    Types: Methamphetamines   Sexual activity: Not on file  Other Topics Concern   Not on file  Social History Narrative   Not on file   Social Determinants of Health   Financial Resource Strain: Not on file  Food Insecurity: Food Insecurity Present (05/05/2022)   Hunger Vital Sign    Worried About Running Out of Food in the Last Year: Sometimes true    Ran Out of Food in the Last Year: Sometimes true  Transportation  Needs: Not on file  Physical Activity: Not on file  Stress: Not on file  Social Connections: Not on file  Intimate Partner Violence: Not on file    Review of Systems: ROS negative except for what is noted on the assessment and plan.  Vitals:   09/27/22 1407  BP: 126/73  Pulse: 88  Temp: (!) 97.5 F (36.4 C)  TempSrc: Oral  SpO2: 95%  Weight: 172 lb 9.6 oz (78.3 kg)  Height: 6' (1.829 m)    Physical Exam: Constitutional: Tired appearing male,  in no acute distress HENT: normocephalic atraumatic, mucous membranes moist Eyes: conjunctiva non-erythematous Neck: supple Cardiovascular: regular rate and rhythm, no m/r/g Pulmonary/Chest: normal work of breathing on room air, wheezes heard bilaterally throughout lung field Abdominal: soft, non-tender, non-distended    Assessment & Plan:   COPD exacerbation (HCC) Pt  presents for follow up regarding his COPD. Currently is still smoking  half a pack a day, down from 2 packs a day last year. He has not had any formal PFTs done before. The only inhaler he has is albuterol, at last visit he was prescribed Breztri, however was not able to obtain it due to cost. He would fit in the GOLD D category, and would need an inhaler with an ICS+LABA or LAMA. I went through the website for [facility] and had him enroll in a free sample, he will let me know when if he does get the email. In the meantime, will use Trelegy, and provided him with a GoodRx coupon for this, which he says will be affordable for him.   Plan:  - Refilled albuterol rescue inhaler  - Trelegy  - Formal PFTs  Fatigue Pt endorses day time fatigue that has been going on for quite a while. He also states that he has woken up multiple times throughout the night feeling like he can't breathe. His STOP-BANG score is 5, making him at high risk for sleep apnea. He has never worn a CPAP before. Will refer for sleep study for further evaluation.   Patient discussed with Dr. Rockey Situ, M.D. [facility] Internal [facility], PGY-1 Pager: [phone] Date 09/28/2022 Time 10:41 AM

## 2022-09-28 NOTE — Assessment & Plan Note (Signed)
Pt endorses day time fatigue that has been going on for quite a while. He also states that he has woken up multiple times throughout the night feeling like he can't breathe. His STOP-BANG score is 5, making him at high risk for sleep apnea. He has never worn a CPAP before. Will refer for sleep study for further evaluation.

## 2022-09-28 NOTE — Assessment & Plan Note (Signed)
>>  ASSESSMENT AND PLAN FOR COPD (CHRONIC OBSTRUCTIVE PULMONARY DISEASE) (HCC) WRITTEN ON 09/28/2022 10:39 AM BY NOORUDDIN, SAAD, MD  Pt presents for follow up regarding his COPD. Currently is still smoking  half a pack a day, down from 2 packs a day last year. He has not had any formal PFTs done before. The only inhaler he has is albuterol , at last visit he was prescribed Breztri , however was not able to obtain it due to cost. He would fit in the GOLD D category, and would need an inhaler with an ICS+LABA or LAMA. I went through the website for Breztri  and had him enroll in a free sample, he will let me know when if he does get the email. In the meantime, will use Trelegy, and provided him with a GoodRx coupon for this, which he says will be affordable for him.   Plan:  - Refilled albuterol  rescue inhaler  - Trelegy  - Formal PFTs

## 2022-09-28 NOTE — Assessment & Plan Note (Signed)
Pt presents for follow up regarding his COPD. Currently is still smoking  half a pack a day, down from 2 packs a day last year. He has not had any formal PFTs done before. The only inhaler he has is albuterol, at last visit he was prescribed Breztri, however was not able to obtain it due to cost. He would fit in the GOLD D category, and would need an inhaler with an ICS+LABA or LAMA. I went through the website for [facility] and had him enroll in a free sample, he will let me know when if he does get the email. In the meantime, will use Trelegy, and provided him with a GoodRx coupon for this, which he says will be affordable for him.   Plan:  - Refilled albuterol rescue inhaler  - Trelegy  - Formal PFTs

## 2022-10-02 NOTE — Progress Notes (Signed)
Internal Medicine Clinic Attending  Case discussed with the resident at the time of the visit.  We reviewed the resident's history and exam and pertinent patient test results.  I agree with the assessment, diagnosis, and plan of care documented in the resident's note.  

## 2022-10-14 ENCOUNTER — Other Ambulatory Visit: Payer: Self-pay | Admitting: Internal Medicine

## 2022-10-14 ENCOUNTER — Other Ambulatory Visit (HOSPITAL_COMMUNITY): Payer: Self-pay

## 2022-10-14 MED ORDER — NITROGLYCERIN 0.4 MG SL SUBL
0.4000 mg | SUBLINGUAL_TABLET | SUBLINGUAL | 2 refills | Status: DC | PRN
  Filled 2022-10-14: qty 25, 8d supply, fill #0
  Filled 2023-04-14: qty 25, 8d supply, fill #1

## 2022-10-15 ENCOUNTER — Other Ambulatory Visit (HOSPITAL_COMMUNITY): Payer: Self-pay

## 2022-10-22 ENCOUNTER — Other Ambulatory Visit (HOSPITAL_COMMUNITY): Payer: Self-pay

## 2022-10-26 ENCOUNTER — Other Ambulatory Visit (HOSPITAL_COMMUNITY): Payer: Self-pay

## 2022-10-26 ENCOUNTER — Telehealth: Payer: Self-pay

## 2022-10-26 NOTE — Telephone Encounter (Signed)
Patient called he is requesting a rx for prednisone, he stated he is having SOB if he can't get the rx he will go to the ED.

## 2022-11-04 ENCOUNTER — Other Ambulatory Visit (HOSPITAL_COMMUNITY): Payer: Self-pay

## 2022-11-04 ENCOUNTER — Other Ambulatory Visit: Payer: Self-pay

## 2022-11-04 ENCOUNTER — Other Ambulatory Visit: Payer: Self-pay | Admitting: Internal Medicine

## 2022-11-04 MED ORDER — ALBUTEROL SULFATE (2.5 MG/3ML) 0.083% IN NEBU
2.5000 mg | INHALATION_SOLUTION | Freq: Four times a day (QID) | RESPIRATORY_TRACT | 2 refills | Status: DC | PRN
  Filled 2022-11-04: qty 300, 25d supply, fill #0
  Filled 2023-01-25: qty 300, 25d supply, fill #1
  Filled 2023-03-24: qty 300, 25d supply, fill #2

## 2022-11-05 ENCOUNTER — Other Ambulatory Visit (HOSPITAL_COMMUNITY): Payer: Self-pay

## 2022-11-05 ENCOUNTER — Other Ambulatory Visit: Payer: Self-pay | Admitting: Student

## 2022-11-08 ENCOUNTER — Other Ambulatory Visit: Payer: Self-pay

## 2022-11-08 ENCOUNTER — Other Ambulatory Visit (HOSPITAL_COMMUNITY): Payer: Self-pay

## 2022-11-09 ENCOUNTER — Other Ambulatory Visit (HOSPITAL_COMMUNITY): Payer: Self-pay

## 2022-11-09 MED ORDER — ATORVASTATIN CALCIUM 40 MG PO TABS
40.0000 mg | ORAL_TABLET | Freq: Every day | ORAL | 2 refills | Status: DC
Start: 2022-11-09 — End: 2023-03-24
  Filled 2022-11-09 (×2): qty 30, 30d supply, fill #0
  Filled 2023-01-03: qty 30, 30d supply, fill #1
  Filled 2023-02-15: qty 30, 30d supply, fill #2

## 2022-11-09 MED ORDER — ALBUTEROL SULFATE HFA 108 (90 BASE) MCG/ACT IN AERS
2.0000 | INHALATION_SPRAY | RESPIRATORY_TRACT | 2 refills | Status: DC | PRN
  Filled 2022-11-09: qty 6.7, 25d supply, fill #0
  Filled 2022-11-26 – 2022-11-29 (×2): qty 6.7, 25d supply, fill #1

## 2022-11-11 ENCOUNTER — Other Ambulatory Visit: Payer: Self-pay

## 2022-11-11 ENCOUNTER — Encounter (HOSPITAL_COMMUNITY): Payer: Self-pay

## 2022-11-11 ENCOUNTER — Emergency Department (HOSPITAL_COMMUNITY): Payer: No Typology Code available for payment source

## 2022-11-11 ENCOUNTER — Emergency Department (HOSPITAL_COMMUNITY)
Admission: EM | Admit: 2022-11-11 | Discharge: 2022-11-12 | Disposition: A | Discharge status: Home / Self Care | Payer: No Typology Code available for payment source | Attending: Student | Admitting: Student

## 2022-11-11 DIAGNOSIS — R0602 Shortness of breath: Secondary | ICD-10-CM | POA: Diagnosis not present

## 2022-11-11 DIAGNOSIS — R062 Wheezing: Secondary | ICD-10-CM | POA: Insufficient documentation

## 2022-11-11 DIAGNOSIS — J441 Chronic obstructive pulmonary disease with (acute) exacerbation: Secondary | ICD-10-CM

## 2022-11-11 DIAGNOSIS — I2510 Atherosclerotic heart disease of native coronary artery without angina pectoris: Secondary | ICD-10-CM | POA: Insufficient documentation

## 2022-11-11 DIAGNOSIS — I110 Hypertensive heart disease with heart failure: Secondary | ICD-10-CM | POA: Diagnosis not present

## 2022-11-11 DIAGNOSIS — F17210 Nicotine dependence, cigarettes, uncomplicated: Secondary | ICD-10-CM | POA: Insufficient documentation

## 2022-11-11 DIAGNOSIS — J449 Chronic obstructive pulmonary disease, unspecified: Secondary | ICD-10-CM | POA: Insufficient documentation

## 2022-11-11 DIAGNOSIS — Z79899 Other long term (current) drug therapy: Secondary | ICD-10-CM | POA: Diagnosis not present

## 2022-11-11 DIAGNOSIS — I5040 Unspecified combined systolic (congestive) and diastolic (congestive) heart failure: Secondary | ICD-10-CM | POA: Insufficient documentation

## 2022-11-11 DIAGNOSIS — Z7982 Long term (current) use of aspirin: Secondary | ICD-10-CM | POA: Insufficient documentation

## 2022-11-11 MED ORDER — IPRATROPIUM-ALBUTEROL 0.5-2.5 (3) MG/3ML IN SOLN
9.0000 mL | Freq: Once | RESPIRATORY_TRACT | Status: AC
  Administered 2022-11-12: 9 mL via RESPIRATORY_TRACT
  Filled 2022-11-11: qty 12

## 2022-11-11 NOTE — ED Triage Notes (Addendum)
Pt BIB [facility] d/t SOB for 2 days.  Pt has O2 @ home but only uses PRN.  When FD arrived he labored breathing 24 RR and 88 % on RA.  After Duoneb 99% but still tugging and is wheezing.  Pt attempted his Inhaler at home x multiple but did not help.

## 2022-11-12 ENCOUNTER — Other Ambulatory Visit (HOSPITAL_COMMUNITY): Payer: Self-pay

## 2022-11-12 MED ORDER — PREDNISONE 20 MG PO TABS: 60.0000 mg | ORAL_TABLET | Freq: Once | ORAL | Status: DC

## 2022-11-12 MED ORDER — ALBUTEROL SULFATE (2.5 MG/3ML) 0.083% IN NEBU
10.0000 mg/h | INHALATION_SOLUTION | Freq: Once | RESPIRATORY_TRACT | Status: AC
  Administered 2022-11-12: 10 mg/h via RESPIRATORY_TRACT

## 2022-11-12 MED ORDER — METHYLPREDNISOLONE SODIUM SUCC 125 MG IJ SOLR
125.0000 mg | Freq: Once | INTRAMUSCULAR | Status: AC
  Administered 2022-11-12: 125 mg via INTRAVENOUS
  Filled 2022-11-12: qty 2

## 2022-11-12 MED ORDER — PREDNISONE 10 MG PO TABS
40.0000 mg | ORAL_TABLET | Freq: Every day | ORAL | 0 refills | Status: AC
  Filled 2022-11-12 – 2022-11-23 (×2): qty 16, 4d supply, fill #0

## 2022-11-12 NOTE — ED Provider Notes (Signed)
[place] EMERGENCY DEPARTMENT AT [facility] Provider Note  CSN: [id number] Arrival date & time: 11/11/22 2242  Chief Complaint(s) Shortness of Breath  HPI Francisco [name] is a 62 y.o. male with PMH CHF, COPD, previous IV drug use presents emerged apartment for evaluation of shortness of breath.  States that symptoms have been worsening over the last 3 days and feels similar to the last time he had to come to the emergency department.  He has been using his home nebulizer without relief.  Endorses dyspnea on exertion, wheezing and cough.  Denies chest pain, abdominal pain, nausea, vomiting or other systemic symptoms.   Past Medical History Past Medical History:  Diagnosis Date   CHF (congestive heart failure) (HCC)    COPD (chronic obstructive pulmonary disease) (HCC)    COPD with acute exacerbation (HCC) 10/21/2021   Coronary artery disease    a. s/p prior PCI.   Hepatitis-C    Hypertension    IV drug abuse (HCC)    a. 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 Authorizing Provider  acetaminophen (TYLENOL) 325 MG tablet Take 650 mg by mouth every 6 (six) hours as needed for moderate pain or headache.     [provider]  albuterol (PROVENTIL) (2.5 MG/3ML) 0.083% nebulizer solution Use 1 vial (2.5 mg total) by nebulization every 6 (six) hours as needed for wheezing or shortness of breath. 11/04/22   Nooruddin, Jason Fila, MD  albuterol (VENTOLIN HFA) 108 (90 Base) MCG/ACT inhaler Inhale 2 puffs into the lungs every 4 (four) hours as needed for wheezing or shortness of breath. 11/09/22   Nooruddin, Jason Fila, MD  amLODipine (NORVASC) 5 MG tablet Take 1 tablet (5 mg total) by mouth daily. 08/11/22   Adron Bene, MD  aspirin EC 81 MG tablet Take 81 mg by mouth daily. Swallow whole.    [provider]  atorvastatin (LIPITOR) 40 MG tablet Take 1 tablet (40 mg total) by mouth daily. 11/09/22   Nooruddin, Jason Fila, MD  Fluticasone-Umeclidin-Vilant (TRELEGY ELLIPTA) 100-62.5-25 MCG/ACT AEPB Please use 1 puff daily. 09/27/22   Nooruddin, Jason Fila, MD  furosemide (LASIX) 40 MG tablet Take 1 tablet (40 mg total) by mouth daily. 05/05/22   Adron Bene, MD  lisinopril (ZESTRIL) 40 MG tablet Take 1/2 tablet (20 mg total) by mouth daily. 08/25/22   Quincy Simmonds, MD  metoprolol succinate (TOPROL-XL) 25 MG 24 hr tablet Take 1 tablet (25 mg total) by mouth in the morning. 08/11/22   Adron Bene, MD  nicotine (NICODERM CQ - DOSED IN MG/24 HOURS) 14 mg/24hr patch Place 1 patch (14 mg total) onto the skin daily. Patient not taking: Reported on 08/13/2021 06/24/21   Adron Bene, MD  nitroGLYCERIN (  NITROSTAT) 0.4 MG SL tablet Place 1 tablet (0.4 mg total) under the tongue every 5 (five) minutes x 3 doses as needed for chest pain. 10/14/22   Olegario Messier, MD                                                                                                                                    Past Surgical History Past Surgical History:  Procedure Laterality Date   BACK SURGERY     NECK SURGERY     RIGHT/LEFT HEART CATH AND CORONARY ANGIOGRAPHY N/A 04/30/2019   Procedure: RIGHT/LEFT HEART CATH AND CORONARY  ANGIOGRAPHY;  Surgeon: Marykay Lex, MD;  Location: [facility] INVASIVE CV LAB;  Service: Cardiovascular;  Laterality: N/A;   stints     Family History Family History  Problem Relation Age of Onset   Rheum arthritis Mother    Heart disease Paternal Grandfather     Social History Social History   Tobacco Use   Smoking status: Every Day    Current packs/day: 0.50    Types: Cigarettes   Smokeless tobacco: Never  Vaping Use   Vaping status: Never Used  Substance Use Topics   Alcohol use: Never   Drug use: Yes    Types: Methamphetamines   Allergies Ivp dye [iodinated contrast media] and Tramadol  Review of Systems Review of Systems  Respiratory:  Positive for cough, shortness of breath and wheezing.     Physical Exam Vital Signs  I have reviewed the triage vital signs BP (!) 154/97   Pulse 82   Temp 98.2 F (36.8 C) (Oral)   Resp 16   Ht 6' (1.829 m)   Wt 79.4 kg   SpO2 92%   BMI 23.73 kg/m   Physical Exam Constitutional:      General: He is not in acute distress.    Appearance: Normal appearance.  HENT:     Head: Normocephalic and atraumatic.     Nose: No congestion or rhinorrhea.  Eyes:     General:        Right eye: No discharge.        Left eye: No discharge.     Extraocular Movements: Extraocular movements intact.     Pupils: Pupils are equal, round, and reactive to light.  Cardiovascular:     Rate and Rhythm: Normal rate and regular rhythm.     Heart sounds: No murmur heard. Pulmonary:     Effort: No respiratory distress.     Breath sounds: Wheezing present. No rales.  Abdominal:     General: There is no distension.     Tenderness: There is no abdominal tenderness.  Musculoskeletal:        General: Normal range of motion.     Cervical back: Normal range of motion.  Skin:    General: Skin is warm and dry.  Neurological:     General: No focal deficit present.  Mental Status: He is alert.     ED Results and Treatments Labs (all labs  ordered are listed, but only abnormal results are displayed) Labs Reviewed  BASIC METABOLIC PANEL  CBC  BRAIN NATRIURETIC PEPTIDE  TROPONIN I (HIGH SENSITIVITY)                                                                                                                          Radiology DG Chest 2 View  Result Date: 11/11/2022 CLINICAL DATA:  Encounter for shortness of breath. EXAM: CHEST - 2 VIEW COMPARISON:  AP Lat 09/08/2022 FINDINGS: The heart size and mediastinal contours are within normal limits. Both lungs are mildly emphysematous but clear. There is osteopenia, mild thoracic kyphosis and mild wedging of 3 contiguous midthoracic levels, unchanged. No new or acute osseous findings. IMPRESSION: COPD changes. No evidence of acute chest process. Stable COPD chest. Electronically Signed   By: Almira Bar M.D.   On: 11/11/2022 23:11    Pertinent labs & imaging results that were available during my care of the patient were reviewed by me and considered in my medical decision making (see MDM for details).  Medications Ordered in ED Medications  ipratropium-albuterol (DUONEB) 0.5-2.5 (3) MG/3ML nebulizer solution 9 mL (has no administration in time range)  predniSONE (DELTASONE) tablet 60 mg (has no administration in time range)                                                                                                                                     Procedures .Critical Care  Performed by: Glendora Score, MD Authorized by: Glendora Score, MD   Critical care provider statement:    Critical care time (minutes):  30   Critical care was necessary to treat or prevent imminent or life-threatening deterioration of the following conditions:  Respiratory failure   Critical care was time spent personally by me on the following activities:  Development of treatment plan with patient or surrogate, discussions with consultants, evaluation of patient's response to treatment,  examination of patient, ordering and review of laboratory studies, ordering and review of radiographic studies, ordering and performing treatments and interventions, pulse oximetry, re-evaluation of patient's condition and review of old charts   (including critical care time)  Medical Decision Making / ED Course   This patient presents to the ED for concern of shortness of breath, this involves an extensive  number of treatment options, and is a complaint that carries with it a high risk of complications and morbidity.  The differential diagnosis includes Pe, PTX, Pulmonary Edema, ARDS, COPD/Asthma, ACS, CHF exacerbation, Arrhythmia, Pericardial Effusion/Tamponade, Anemia, Sepsis, Acidosis/Hypercapnia, Anxiety, Viral URI  MDM: Patient seen emerged part for evaluation of shortness of breath.  Physical exam with expiratory wheezing bilaterally and mild accessory muscle use but no hypoxia seen on arrival.  Laboratory valuation unremarkable with normal high-sensitivity troponin and BNP.  ECG nonischemic.  Chest x-ray consistent with COPD but no focal opacity.  Patient given methylprednisolone and 3 DuoNebs and on reevaluation wheezing starting to improve but still persistent.  Patient received an hour-long albuterol treatment and on second reevaluation symptoms significant improved.  At this time with symptoms improved he does not meet inpatient criteria for admission and safer discharge with outpatient follow-up.  Patient discharged with prednisone.   Additional history obtained: -External records from outside source obtained and reviewed including: Chart review including previous notes, labs, imaging, consultation notes   Lab Tests: -I ordered, reviewed, and interpreted labs.   The pertinent results include:   Labs Reviewed  BASIC METABOLIC PANEL  CBC  BRAIN NATRIURETIC PEPTIDE  TROPONIN I (HIGH SENSITIVITY)      EKG   EKG Interpretation Date/Time:  Thursday November 11 2022 22:45:31  EDT Ventricular Rate:  99 PR Interval:  146 QRS Duration:  70 QT Interval:  326 QTC Calculation: 418 R Axis:   67  Text Interpretation: Normal sinus rhythm Normal ECG When compared with ECG of 08-Sep-2022 09:22, PREVIOUS ECG IS PRESENT Confirmed by Lakesha Levinson ([id number]) on 11/12/2022 4:45:39 AM         Imaging Studies ordered: I ordered imaging studies including CXR I independently visualized and interpreted imaging. I agree with the radiologist interpretation   Medicines ordered and prescription drug management: Meds ordered this encounter  Medications   ipratropium-albuterol (DUONEB) 0.5-2.5 (3) MG/3ML nebulizer solution 9 mL   predniSONE (DELTASONE) tablet 60 mg    -I have reviewed the patients home medicines and have made adjustments as needed  Critical interventions Multiple DuoNebs, steroids    Cardiac Monitoring: The patient was maintained on a cardiac monitor.  I personally viewed and interpreted the cardiac monitored which showed an underlying rhythm of: NSR   Social Determinants of Health:  Factors impacting patients care include: none   Reevaluation: After the interventions noted above, I reevaluated the patient and found that they have :improved  Co morbidities that complicate the patient evaluation  Past Medical History:  Diagnosis Date   CHF (congestive heart failure) (HCC)    COPD (chronic obstructive pulmonary disease) (HCC)    COPD with acute exacerbation (HCC) 10/21/2021   Coronary artery disease    a. s/p prior PCI.   Hepatitis-C    Hypertension    IV drug abuse (HCC)    a. 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   Medically noncompliant    MI (myocardial infarction) (HCC)    Tobacco abuse       Dispostion: I considered admission for this patient, but at this time he does not meet admission criteria for admission he is safe for discharge with outpatient follow-up     Final Clinical Impression(s) / ED Diagnoses Final  diagnoses:  None     @PCDICTATION @    Kayl Stogdill, Wyn Forster, MD 11/12/22 [id number]

## 2022-11-15 ENCOUNTER — Other Ambulatory Visit (HOSPITAL_COMMUNITY): Payer: Self-pay

## 2022-11-23 ENCOUNTER — Other Ambulatory Visit (HOSPITAL_COMMUNITY): Payer: Self-pay

## 2022-11-25 ENCOUNTER — Encounter (HOSPITAL_BASED_OUTPATIENT_CLINIC_OR_DEPARTMENT_OTHER): Payer: No Typology Code available for payment source | Admitting: Internal Medicine

## 2022-11-26 ENCOUNTER — Other Ambulatory Visit: Payer: Self-pay | Admitting: Internal Medicine

## 2022-11-26 ENCOUNTER — Other Ambulatory Visit (HOSPITAL_COMMUNITY): Payer: Self-pay

## 2022-11-26 ENCOUNTER — Other Ambulatory Visit: Payer: Self-pay

## 2022-11-29 ENCOUNTER — Ambulatory Visit (HOSPITAL_BASED_OUTPATIENT_CLINIC_OR_DEPARTMENT_OTHER): Payer: No Typology Code available for payment source | Admitting: Internal Medicine

## 2022-11-29 ENCOUNTER — Other Ambulatory Visit (HOSPITAL_COMMUNITY): Payer: Self-pay

## 2022-11-29 MED ORDER — METOPROLOL SUCCINATE ER 25 MG PO TB24
25.0000 mg | ORAL_TABLET | Freq: Every morning | ORAL | 2 refills | Status: DC
  Filled 2022-11-29: qty 30, 30d supply, fill #0
  Filled 2023-01-03: qty 30, 30d supply, fill #1

## 2022-11-29 MED ORDER — AMLODIPINE BESYLATE 5 MG PO TABS
5.0000 mg | ORAL_TABLET | Freq: Every day | ORAL | 2 refills | Status: DC
  Filled 2022-11-29: qty 30, 30d supply, fill #0
  Filled 2023-01-03: qty 30, 30d supply, fill #1
  Filled 2023-02-15: qty 30, 30d supply, fill #2

## 2022-12-11 ENCOUNTER — Other Ambulatory Visit: Payer: Self-pay

## 2022-12-11 ENCOUNTER — Emergency Department (HOSPITAL_COMMUNITY)
Admission: EM | Admit: 2022-12-11 | Discharge: 2022-12-11 | Disposition: A | Discharge status: Home / Self Care | Payer: No Typology Code available for payment source | Attending: Emergency Medicine | Admitting: Emergency Medicine

## 2022-12-11 ENCOUNTER — Encounter (HOSPITAL_COMMUNITY): Payer: Self-pay | Admitting: Emergency Medicine

## 2022-12-11 ENCOUNTER — Emergency Department (HOSPITAL_COMMUNITY): Payer: No Typology Code available for payment source

## 2022-12-11 DIAGNOSIS — Z20822 Contact with and (suspected) exposure to covid-19: Secondary | ICD-10-CM | POA: Diagnosis not present

## 2022-12-11 DIAGNOSIS — Z7952 Long term (current) use of systemic steroids: Secondary | ICD-10-CM | POA: Diagnosis not present

## 2022-12-11 DIAGNOSIS — I509 Heart failure, unspecified: Secondary | ICD-10-CM | POA: Insufficient documentation

## 2022-12-11 DIAGNOSIS — Z7951 Long term (current) use of inhaled steroids: Secondary | ICD-10-CM | POA: Diagnosis not present

## 2022-12-11 DIAGNOSIS — I110 Hypertensive heart disease with heart failure: Secondary | ICD-10-CM | POA: Insufficient documentation

## 2022-12-11 DIAGNOSIS — Z79899 Other long term (current) drug therapy: Secondary | ICD-10-CM | POA: Insufficient documentation

## 2022-12-11 DIAGNOSIS — J441 Chronic obstructive pulmonary disease with (acute) exacerbation: Secondary | ICD-10-CM | POA: Insufficient documentation

## 2022-12-11 DIAGNOSIS — I2510 Atherosclerotic heart disease of native coronary artery without angina pectoris: Secondary | ICD-10-CM | POA: Diagnosis not present

## 2022-12-11 DIAGNOSIS — F17200 Nicotine dependence, unspecified, uncomplicated: Secondary | ICD-10-CM | POA: Diagnosis not present

## 2022-12-11 DIAGNOSIS — Z7982 Long term (current) use of aspirin: Secondary | ICD-10-CM | POA: Insufficient documentation

## 2022-12-11 DIAGNOSIS — R0602 Shortness of breath: Secondary | ICD-10-CM | POA: Diagnosis present

## 2022-12-11 LAB — BASIC METABOLIC PANEL
Anion gap: 15 (ref 5–15)
BUN: 9 mg/dL (ref 8–23)
CO2: 30 mmol/L (ref 22–32)
Calcium: 9.6 mg/dL (ref 8.9–10.3)
Chloride: 97 mmol/L — ABNORMAL LOW (ref 98–111)
Creatinine, Ser: 1.05 mg/dL (ref 0.61–1.24)
GFR, Estimated: >60 mL/min (ref >60)
Glucose, Bld: 130 mg/dL — ABNORMAL HIGH (ref 70–99)
Potassium: 4.8 mmol/L (ref 3.5–5.1)
Sodium: 142 mmol/L (ref 135–145)

## 2022-12-11 LAB — RESP PANEL BY RT-PCR (RSV, FLU A&B, COVID)  RVPGX2
Influenza A by PCR: NEGATIVE
Influenza B by PCR: NEGATIVE
Resp Syncytial Virus by PCR: NEGATIVE
SARS Coronavirus 2 by RT PCR: NEGATIVE

## 2022-12-11 LAB — CBC
HCT: 48.5 % (ref 39.0–52.0)
Hemoglobin: 16.1 g/dL (ref 13.0–17.0)
MCH: 31.1 pg (ref 26.0–34.0)
MCHC: 33.2 g/dL (ref 30.0–36.0)
MCV: 93.8 fL (ref 80.0–100.0)
Platelets: 267 10*3/uL (ref 150–400)
RBC: 5.17 MIL/uL (ref 4.22–5.81)
RDW: 13.2 % (ref 11.5–15.5)
WBC: 8.6 10*3/uL (ref 4.0–10.5)
nRBC: 0 % (ref 0.0–0.2)

## 2022-12-11 MED ORDER — ALBUTEROL SULFATE HFA 108 (90 BASE) MCG/ACT IN AERS: 2.0000 | INHALATION_SPRAY | RESPIRATORY_TRACT | 2 refills | Status: DC | PRN

## 2022-12-11 MED ORDER — METHYLPREDNISOLONE SODIUM SUCC 125 MG IJ SOLR
125.0000 mg | Freq: Once | INTRAMUSCULAR | Status: AC
  Administered 2022-12-11: 125 mg via INTRAVENOUS
  Filled 2022-12-11: qty 2

## 2022-12-11 MED ORDER — IPRATROPIUM-ALBUTEROL 0.5-2.5 (3) MG/3ML IN SOLN
3.0000 mL | Freq: Once | RESPIRATORY_TRACT | Status: AC
  Administered 2022-12-11: 3 mL via RESPIRATORY_TRACT
  Filled 2022-12-11: qty 3

## 2022-12-11 MED ORDER — PREDNISONE 20 MG PO TABS: 40.0000 mg | ORAL_TABLET | Freq: Every day | ORAL | 0 refills | Status: DC

## 2022-12-11 NOTE — ED Provider Notes (Signed)
[place] EMERGENCY DEPARTMENT AT [facility] Provider Note   CSN: [id number] Arrival date & time: 12/11/22  1715     History  Chief Complaint  Patient presents with   Shortness of Breath    Francisco [name] is a 62 y.o. male with past medical history significant for COPD, dilated cardiomyopathy, meth use, tobacco use presents with concern for increased shortness of breath with activity over the last 2 days with shortness of breath at rest today.  He was initially tachypneic with saturation of 87% in triage.  Placed on nasal cannula and taken to room.  Denies any chest pain, denies any signs of fluid overload.  Reports he has a daily nebulizer which he continues to use, but reports that his rescue inhaler was misplaced or stolen recently.   Shortness of Breath      Home Medications Prior to Admission medications   Medication Sig Start Date End Date Taking? Authorizing Provider  predniSONE (DELTASONE) 20 MG tablet Take 2 tablets (40 mg total) by mouth daily. 12/11/22  Yes Jamylah Marinaccio H, PA-C  acetaminophen (TYLENOL) 325 MG tablet Take 650 mg by mouth every 6 (six) hours as needed for moderate pain or headache.    [provider]  albuterol (PROVENTIL) (2.5 MG/3ML) 0.083% nebulizer solution Use 1 vial (2.5 mg total) by nebulization every 6 (six) hours as needed for wheezing or shortness of breath. 11/04/22   Nooruddin, Jason Fila, MD  albuterol (VENTOLIN HFA) 108 (90 Base) MCG/ACT inhaler Inhale 2 puffs into the lungs every 4 (four) hours as needed for wheezing or shortness of breath. 12/11/22   Susana Gripp H, PA-C  amLODipine (NORVASC) 5 MG tablet Take 1 tablet (5 mg total) by mouth daily. 11/29/22   Nooruddin, Jason Fila, MD  aspirin EC 81 MG tablet Take 81 mg by mouth daily. Swallow whole.    [provider]  atorvastatin (LIPITOR) 40 MG tablet Take 1 tablet (40 mg total) by mouth daily. 11/09/22   Nooruddin, Jason Fila, MD  Fluticasone-Umeclidin-Vilant (TRELEGY  ELLIPTA) 100-62.5-25 MCG/ACT AEPB Please use 1 puff daily. 09/27/22   Nooruddin, Jason Fila, MD  furosemide (LASIX) 40 MG tablet Take 1 tablet (40 mg total) by mouth daily. 05/05/22   Adron Bene, MD  lisinopril (ZESTRIL) 40 MG tablet Take 1/2 tablet (20 mg total) by mouth daily. 08/25/22   Quincy Simmonds, MD  metoprolol succinate (TOPROL-XL) 25 MG 24 hr tablet Take 1 tablet (25 mg total) by mouth in the morning. 11/29/22   Nooruddin, Jason Fila, MD  nicotine (NICODERM CQ - DOSED IN MG/24 HOURS) 14 mg/24hr patch Place 1 patch (14 mg total) onto the skin daily. Patient not taking: Reported on 08/13/2021 06/24/21   Adron Bene, MD  nitroGLYCERIN (NITROSTAT) 0.4 MG SL tablet Place 1 tablet (0.4 mg total) under the tongue every 5 (five) minutes x 3 doses as needed for chest pain. 10/14/22   Nooruddin, Jason Fila, MD      Allergies    Ivp dye [iodinated contrast media] and Tramadol    Review of Systems   Review of Systems  Respiratory:  Positive for shortness of breath.   All other systems reviewed and are negative.   Physical Exam Updated Vital Signs BP 126/86   Pulse 99   Temp 98 F (36.7 C) (Oral)   Resp (!) 21   SpO2 94%  Physical Exam Vitals and nursing note reviewed.  Constitutional:      General: He is not in acute distress.    Appearance: Normal  appearance.  HENT:     Head: Normocephalic and atraumatic.  Eyes:     General:        Right eye: No discharge.        Left eye: No discharge.  Cardiovascular:     Rate and Rhythm: Normal rate and regular rhythm.     Heart sounds: No murmur heard.    No friction rub. No gallop.  Pulmonary:     Effort: Pulmonary effort is normal.     Breath sounds: Normal breath sounds.     Comments: Patient with some wheezing in both lungs fields bilaterally, as well as scattered rhonchi.  Some tachypnea on arrival resolved on reassessment Abdominal:     General: Bowel sounds are normal.     Palpations: Abdomen is soft.  Skin:    General: Skin is warm and  dry.     Capillary Refill: Capillary refill takes less than 2 seconds.  Neurological:     Mental Status: He is alert and oriented to person, place, and time.  Psychiatric:        Mood and Affect: Mood normal.        Behavior: Behavior normal.     ED Results / Procedures / Treatments   Labs (all labs ordered are listed, but only abnormal results are displayed) Labs Reviewed  BASIC METABOLIC PANEL - Abnormal; Notable for the following components:      Result Value   Chloride 97 (*)    Glucose, Bld 130 (*)    All other components within normal limits  RESP PANEL BY RT-PCR (RSV, FLU A&B, COVID)  RVPGX2  CBC    EKG None  Radiology DG Chest 2 View  Result Date: 12/11/2022 CLINICAL DATA:  Shortness of breath. EXAM: CHEST - 2 VIEW COMPARISON:  11/11/2022 FINDINGS: The heart size and mediastinal contours are within normal limits. Coronary artery stent again seen. Both lungs are clear. Pulmonary hyperinflation is again seen, consistent with COPD. Cervical spine fusion hardware also noted. IMPRESSION: No active cardiopulmonary disease. COPD. Electronically Signed   By: Danae Orleans M.D.   On: 12/11/2022 18:14    Procedures Procedures    Medications Ordered in ED Medications  ipratropium-albuterol (DUONEB) 0.5-2.5 (3) MG/3ML nebulizer solution 3 mL (3 mLs Nebulization Given 12/11/22 1752)  methylPREDNISolone sodium succinate (SOLU-MEDROL) 125 mg/2 mL injection 125 mg (125 mg Intravenous Given 12/11/22 1752)    ED Course/ Medical Decision Making/ A&P                                 Medical Decision Making Amount and/or Complexity of Data Reviewed Labs: ordered. Radiology: ordered.  Risk Prescription drug management.   This patient is a 62 y.o. male  who presents to the ED for concern of shortness of breath.   Differential diagnoses prior to evaluation: The emergent differential diagnosis includes, but is not limited to,  asthma exacerbation, COPD exacerbation, acute upper  respiratory infection, acute bronchitis, chronic bronchitis, interstitial lung disease, ARDS, PE, pneumonia, atypical ACS, carbon monoxide poisoning, spontaneous pneumothorax, new CHF vs CHF exacerbation, versus other . This is not an exhaustive differential.   Past Medical History / Co-morbidities / Social History: COPD, hypertension, previous history of meth use, current half pack per day tobacco use, hepatitis C, coronary artery disease, CHF  Additional history: Chart reviewed. Pertinent results include: Reviewed lab work, imaging from patient's recent previous emergency department visits  Physical Exam:  Physical exam performed. The pertinent findings include: Patient initially hypertensive, hypoxic, with tachypnea, diffuse wheezing.  On reassessment after steroids, DuoNeb patient with significant improvement to near full resolution of wheezing, tachypnea improved, no longer requiring oxygen to maintain greater than 93% saturation on room air  Lab Tests/Imaging studies: I personally interpreted labs/imaging and the pertinent results include: CBC unremarkable, BMP with mildly elevated glucose at 130.  RVP negative for COVID, flu, RSV.  I independently interpreted plain film chest x-ray which shows no evidence of acute intrathoracic abnormality, chronic COPD related changes I agree with the radiologist interpretation.  Cardiac monitoring: EKG obtained and interpreted by myself and attending physician which shows: Normal sinus rhythm, right atrial enlargement noted   Medications: I ordered medication including DuoNeb, Solu-Medrol, will discharge with steroids, and refill of his rescue inhaler.  I have reviewed the patients home medicines and have made adjustments as needed.   Disposition: After consideration of the diagnostic results and the patients response to treatment, I feel that patient with signs of COPD exacerbation, initially requiring oxygen, and acute treatment with DuoNeb and  steroids, on reassessment patient with significant improvement of his shortness of breath, wheezing, and feeling improved.  He is able to maintain a stable oxygen saturation on room air and feels comfortable with plan to discharge..   emergency department workup does not suggest an emergent condition requiring admission or immediate intervention beyond what has been performed at this time. The plan is: as above. The patient is safe for discharge and has been instructed to return immediately for worsening symptoms, change in symptoms or any other concerns.  Final Clinical Impression(s) / ED Diagnoses Final diagnoses:  COPD exacerbation (HCC)    Rx / DC Orders ED Discharge Orders          Ordered    albuterol (VENTOLIN HFA) 108 (90 Base) MCG/ACT inhaler  Every 4 hours PRN       Note to Pharmacy: IM Program   12/11/22 1946    predniSONE (DELTASONE) 20 MG tablet  Daily        12/11/22 1946              [place] 12/11/22 2009    Pricilla Loveless, MD 12/15/22 1225

## 2022-12-11 NOTE — ED Triage Notes (Signed)
Pt with COPD and CHF.  Reports increasing shob with activity x 2 days and then today has progressed to shob at rest.  Pt is tachypneic in triage with RA sats in the 80s.  Placed on [place] and taken to room.

## 2022-12-18 ENCOUNTER — Emergency Department (HOSPITAL_COMMUNITY): Payer: No Typology Code available for payment source

## 2022-12-18 ENCOUNTER — Encounter (HOSPITAL_COMMUNITY): Payer: Self-pay

## 2022-12-18 ENCOUNTER — Other Ambulatory Visit: Payer: Self-pay

## 2022-12-18 ENCOUNTER — Emergency Department (HOSPITAL_COMMUNITY)
Admission: EM | Admit: 2022-12-18 | Discharge: 2022-12-18 | Disposition: A | Discharge status: Home / Self Care | Payer: No Typology Code available for payment source

## 2022-12-18 DIAGNOSIS — Z20822 Contact with and (suspected) exposure to covid-19: Secondary | ICD-10-CM | POA: Diagnosis not present

## 2022-12-18 DIAGNOSIS — R0602 Shortness of breath: Secondary | ICD-10-CM | POA: Insufficient documentation

## 2022-12-18 DIAGNOSIS — I509 Heart failure, unspecified: Secondary | ICD-10-CM | POA: Insufficient documentation

## 2022-12-18 DIAGNOSIS — J449 Chronic obstructive pulmonary disease, unspecified: Secondary | ICD-10-CM | POA: Diagnosis not present

## 2022-12-18 DIAGNOSIS — Z7951 Long term (current) use of inhaled steroids: Secondary | ICD-10-CM | POA: Insufficient documentation

## 2022-12-18 DIAGNOSIS — Z7982 Long term (current) use of aspirin: Secondary | ICD-10-CM | POA: Diagnosis not present

## 2022-12-18 LAB — COMPREHENSIVE METABOLIC PANEL
ALT: 31 U/L (ref 0–44)
AST: 19 U/L (ref 15–41)
Albumin: 3.7 g/dL (ref 3.5–5.0)
Alkaline Phosphatase: 68 U/L (ref 38–126)
Anion gap: 12 (ref 5–15)
BUN: 9 mg/dL (ref 8–23)
CO2: 33 mmol/L — ABNORMAL HIGH (ref 22–32)
Calcium: 9.5 mg/dL (ref 8.9–10.3)
Chloride: 95 mmol/L — ABNORMAL LOW (ref 98–111)
Creatinine, Ser: 1.06 mg/dL (ref 0.61–1.24)
GFR, Estimated: >60 mL/min (ref >60)
Glucose, Bld: 89 mg/dL (ref 70–99)
Potassium: 4.9 mmol/L (ref 3.5–5.1)
Sodium: 140 mmol/L (ref 135–145)
Total Bilirubin: 1.2 mg/dL (ref 0.3–1.2)
Total Protein: 6.9 g/dL (ref 6.5–8.1)

## 2022-12-18 LAB — TROPONIN I (HIGH SENSITIVITY): Troponin I (High Sensitivity): 14 ng/L (ref <18)

## 2022-12-18 LAB — CBC WITH DIFFERENTIAL/PLATELET
Abs Immature Granulocytes: 0.12 10*3/uL — ABNORMAL HIGH (ref 0.00–0.07)
Basophils Absolute: 0.1 10*3/uL (ref 0.0–0.1)
Basophils Relative: 1 %
Eosinophils Absolute: 0.3 10*3/uL (ref 0.0–0.5)
Eosinophils Relative: 2 %
HCT: 50.5 % (ref 39.0–52.0)
Hemoglobin: 16.3 g/dL (ref 13.0–17.0)
Immature Granulocytes: 1 %
Lymphocytes Relative: 26 %
Lymphs Abs: 3.2 10*3/uL (ref 0.7–4.0)
MCH: 30.8 pg (ref 26.0–34.0)
MCHC: 32.3 g/dL (ref 30.0–36.0)
MCV: 95.5 fL (ref 80.0–100.0)
Monocytes Absolute: 1.2 10*3/uL — ABNORMAL HIGH (ref 0.1–1.0)
Monocytes Relative: 10 %
Neutro Abs: 7.4 10*3/uL (ref 1.7–7.7)
Neutrophils Relative %: 60 %
Platelets: 343 10*3/uL (ref 150–400)
RBC: 5.29 MIL/uL (ref 4.22–5.81)
RDW: 13.7 % (ref 11.5–15.5)
WBC: 12.3 10*3/uL — ABNORMAL HIGH (ref 4.0–10.5)
nRBC: 0 % (ref 0.0–0.2)

## 2022-12-18 LAB — I-STAT VENOUS BLOOD GAS, ED
Acid-Base Excess: 11 mmol/L — ABNORMAL HIGH (ref 0.0–2.0)
Bicarbonate: 33.9 mmol/L — ABNORMAL HIGH (ref 20.0–28.0)
Calcium, Ion: 0.87 mmol/L — CL (ref 1.15–1.40)
HCT: 51 % (ref 39.0–52.0)
Hemoglobin: 17.3 g/dL — ABNORMAL HIGH (ref 13.0–17.0)
O2 Saturation: 95 %
Potassium: 6.4 mmol/L (ref 3.5–5.1)
Sodium: 133 mmol/L — ABNORMAL LOW (ref 135–145)
TCO2: 35 mmol/L — ABNORMAL HIGH (ref 22–32)
pCO2, Ven: 38.4 mmHg — ABNORMAL LOW (ref 44–60)
pH, Ven: 7.554 — ABNORMAL HIGH (ref 7.25–7.43)
pO2, Ven: 67 mmHg — ABNORMAL HIGH (ref 32–45)

## 2022-12-18 LAB — BRAIN NATRIURETIC PEPTIDE: B Natriuretic Peptide: 103.5 pg/mL — ABNORMAL HIGH (ref 0.0–100.0)

## 2022-12-18 LAB — RESP PANEL BY RT-PCR (RSV, FLU A&B, COVID)  RVPGX2
Influenza A by PCR: NEGATIVE
Influenza B by PCR: NEGATIVE
Resp Syncytial Virus by PCR: NEGATIVE
SARS Coronavirus 2 by RT PCR: NEGATIVE

## 2022-12-18 LAB — MAGNESIUM: Magnesium: 2.1 mg/dL (ref 1.7–2.4)

## 2022-12-18 MED ORDER — METHYLPREDNISOLONE SODIUM SUCC 125 MG IJ SOLR
125.0000 mg | Freq: Once | INTRAMUSCULAR | Status: AC
  Administered 2022-12-18: 125 mg via INTRAVENOUS
  Filled 2022-12-18: qty 2

## 2022-12-18 MED ORDER — MAGNESIUM SULFATE 2 GM/50ML IV SOLN
2.0000 g | Freq: Once | INTRAVENOUS | Status: AC
  Administered 2022-12-18: 2 g via INTRAVENOUS
  Filled 2022-12-18: qty 50

## 2022-12-18 MED ORDER — IPRATROPIUM-ALBUTEROL 0.5-2.5 (3) MG/3ML IN SOLN
3.0000 mL | Freq: Once | RESPIRATORY_TRACT | Status: AC
  Administered 2022-12-18: 3 mL via RESPIRATORY_TRACT
  Filled 2022-12-18: qty 3

## 2022-12-18 NOTE — Discharge Instructions (Signed)
You decided to leave before your workup was complete.  You understand that your symptoms may get worse.  If any worsening symptoms return to the emergency room.

## 2022-12-18 NOTE — ED Triage Notes (Signed)
Pt reports worsening SOB since yesterday, pt using inhalers and neb treatments without relief. Hx of COPD. States he just finished a course of steroids and when he ran out he started feeling SOB again

## 2022-12-18 NOTE — ED Notes (Signed)
MD at bedside. 

## 2022-12-18 NOTE — ED Provider Notes (Signed)
[place] EMERGENCY DEPARTMENT AT [facility] Provider Note   CSN: [id number] Arrival date & time: 12/18/22  1319     History  Chief Complaint  Patient presents with  . Shortness of Breath    Francisco [name] is a 62 y.o. male.  62 year old male with past medical history significant for CHF, COPD presents today for persistent shortness of breath.  He was seen last week for similar complaint and was treated with steroids for COPD exacerbation.  He states while he takes the steroids his symptoms improve.  He just completed the course of steroids and immediately had worsening of his symptoms.  He states for the past couple months he has been sleeping upright in a recliner.  Has been using his rescue inhaler almost daily.  Denies any peripheral edema.  He is around his dry weight.  He takes half the dose of his Lasix.  He is unable to lie flat due to difficulty breathing.  The history is provided by the patient. No language interpreter was used.       Home Medications Prior to Admission medications   Medication Sig Start Date End Date Taking? Authorizing Provider  acetaminophen (TYLENOL) 325 MG tablet Take 650 mg by mouth every 6 (six) hours as needed for moderate pain or headache.    [provider]  albuterol (PROVENTIL) (2.5 MG/3ML) 0.083% nebulizer solution Use 1 vial (2.5 mg total) by nebulization every 6 (six) hours as needed for wheezing or shortness of breath. 11/04/22   Nooruddin, Jason Fila, MD  albuterol (VENTOLIN HFA) 108 (90 Base) MCG/ACT inhaler Inhale 2 puffs into the lungs every 4 (four) hours as needed for wheezing or shortness of breath. 12/11/22   Prosperi, Christian H, PA-C  amLODipine (NORVASC) 5 MG tablet Take 1 tablet (5 mg total) by mouth daily. 11/29/22   Nooruddin, Jason Fila, MD  aspirin EC 81 MG tablet Take 81 mg by mouth daily. Swallow whole.    [provider]  atorvastatin (LIPITOR) 40 MG tablet Take 1 tablet (40 mg total) by mouth daily. 11/09/22    Nooruddin, Jason Fila, MD  Fluticasone-Umeclidin-Vilant (TRELEGY ELLIPTA) 100-62.5-25 MCG/ACT AEPB Please use 1 puff daily. 09/27/22   Nooruddin, Jason Fila, MD  furosemide (LASIX) 40 MG tablet Take 1 tablet (40 mg total) by mouth daily. 05/05/22   Adron Bene, MD  lisinopril (ZESTRIL) 40 MG tablet Take 1/2 tablet (20 mg total) by mouth daily. 08/25/22   Quincy Simmonds, MD  metoprolol succinate (TOPROL-XL) 25 MG 24 hr tablet Take 1 tablet (25 mg total) by mouth in the morning. 11/29/22   Nooruddin, Jason Fila, MD  nicotine (NICODERM CQ - DOSED IN MG/24 HOURS) 14 mg/24hr patch Place 1 patch (14 mg total) onto the skin daily. Patient not taking: Reported on 08/13/2021 06/24/21   Adron Bene, MD  nitroGLYCERIN (NITROSTAT) 0.4 MG SL tablet Place 1 tablet (0.4 mg total) under the tongue every 5 (five) minutes x 3 doses as needed for chest pain. 10/14/22   Nooruddin, Jason Fila, MD  predniSONE (DELTASONE) 20 MG tablet Take 2 tablets (40 mg total) by mouth daily. 12/11/22   Prosperi, Christian H, PA-C      Allergies    Ivp dye [iodinated contrast media] and Tramadol    Review of Systems   Review of Systems  Constitutional:  Negative for chills and fever.  Respiratory:  Positive for shortness of breath and wheezing.   Cardiovascular:  Negative for chest pain and leg swelling.  Gastrointestinal:  Negative for abdominal pain.  Neurological:  Negative for light-headedness.  All other systems reviewed and are negative.   Physical Exam Updated Vital Signs BP (!) 165/116   Pulse 100   Temp 98.5 F (36.9 C) (Oral)   Resp 15   SpO2 97%  Physical Exam Vitals and nursing note reviewed.  Constitutional:      General: He is not in acute distress.    Appearance: Normal appearance. He is ill-appearing (Chronically ill-appearing).  HENT:     Head: Normocephalic and atraumatic.     Nose: Nose normal.  Eyes:     General: No scleral icterus.    Extraocular Movements: Extraocular movements intact.     Conjunctiva/sclera:  Conjunctivae normal.  Cardiovascular:     Rate and Rhythm: Normal rate and regular rhythm.     Heart sounds: Normal heart sounds.  Pulmonary:     Effort: Pulmonary effort is normal. No respiratory distress.     Breath sounds: No wheezing.  Abdominal:     General: There is no distension.     Tenderness: There is no abdominal tenderness.  Musculoskeletal:        General: Normal range of motion.     Cervical back: Normal range of motion.  Skin:    General: Skin is warm and dry.  Neurological:     General: No focal deficit present.     Mental Status: He is alert. Mental status is at baseline.     ED Results / Procedures / Treatments   Labs (all labs ordered are listed, but only abnormal results are displayed) Labs Reviewed  CBC WITH DIFFERENTIAL/PLATELET - Abnormal; Notable for the following components:      Result Value   WBC 12.3 (*)    Monocytes Absolute 1.2 (*)    Abs Immature Granulocytes 0.12 (*)    All other components within normal limits  I-STAT VENOUS BLOOD GAS, ED - Abnormal; Notable for the following components:   pH, Ven 7.554 (*)    pCO2, Ven 38.4 (*)    pO2, Ven 67 (*)    Bicarbonate 33.9 (*)    TCO2 35 (*)    Acid-Base Excess 11.0 (*)    Sodium 133 (*)    Potassium 6.4 (*)    Calcium, Ion 0.87 (*)    Hemoglobin 17.3 (*)    All other components within normal limits  RESP PANEL BY RT-PCR (RSV, FLU A&B, COVID)  RVPGX2  BLOOD GAS, VENOUS  BRAIN NATRIURETIC PEPTIDE  COMPREHENSIVE METABOLIC PANEL  MAGNESIUM  TROPONIN I (HIGH SENSITIVITY)  TROPONIN I (HIGH SENSITIVITY)    EKG None  Radiology DG Chest Port 1 View  Result Date: 12/18/2022 CLINICAL DATA:  dyspnea EXAM: PORTABLE CHEST - 1 VIEW COMPARISON:  12/11/2022 FINDINGS: Lungs are clear. Heart size and mediastinal contours are within normal limits. No effusion. Visualized bones unremarkable. IMPRESSION: No acute cardiopulmonary disease. Electronically Signed   By: Corlis Leak M.D.   On: 12/18/2022 16:38     Procedures Procedures    Medications Ordered in ED Medications  ipratropium-albuterol (DUONEB) 0.5-2.5 (3) MG/3ML nebulizer solution 3 mL (3 mLs Nebulization Given 12/18/22 1608)  methylPREDNISolone sodium succinate (SOLU-MEDROL) 125 mg/2 mL injection 125 mg (125 mg Intravenous Given 12/18/22 1607)  magnesium sulfate IVPB 2 g 50 mL (2 g Intravenous New Bag/Given 12/18/22 1607)    ED Course/ Medical Decision Making/ A&P Clinical Course as of 12/18/22 1741  Sat Dec 18, 2022  1740 Potassium on CMP is 4.9. [AA]    Clinical Course  User Index [AA] Marita Kansas, PA-C                                 Medical Decision Making Amount and/or Complexity of Data Reviewed Labs: ordered. Radiology: ordered.  Risk Prescription drug management.   Medical Decision Making / ED Course  Patient with history of CHF, COPD presents today for worsening shortness of breath.  He just completed a course of steroids and he states on his he was done his symptoms returned.  Workup was ordered.  Solu-Medrol, DuoNebs were given.  Given his orthopnea question if this is a combination of COPD exacerbation as well as CHF exacerbation.  Prior to patient's workup being completed patient requested to leave AGAINST MEDICAL ADVICE.  He states he was aggravated from the beeping sounds in the emergency department.  We discussed that these could be addressed.  We discussed the importance of waiting until the workup was completed to gain a complete picture of what may be going on.  He states he understands but still wishes to leave AGAINST MEDICAL ADVICE.  Patient states he will return if he has any worsening symptoms.  Discharged in stable condition.  VBG showed potassium level of 6.4.  BNP was mildly elevated at 103.  However no peripheral edema and no obvious JVD.  CBC showed leukocytosis at 12.3.  No anemia.  We had an extensive discussion regarding waiting for the remainder of the blood work patient refused.  We discussed  the concerning potassium level on VBG.  He has no EKG changes concerning for hyperkalemia.  We discussed that elevated potassium levels can lead to unstable cardiac rhythms which do not support life.  He voices understanding but still wishes to leave AMA.   Final Clinical Impression(s) / ED Diagnoses Final diagnoses:  Shortness of breath    Rx / DC Orders ED Discharge Orders     None         Marita Kansas, PA-C 12/18/22 1736    Coral Spikes, DO 12/19/22 1505

## 2022-12-24 ENCOUNTER — Ambulatory Visit (HOSPITAL_BASED_OUTPATIENT_CLINIC_OR_DEPARTMENT_OTHER): Payer: No Typology Code available for payment source | Attending: Internal Medicine | Admitting: Internal Medicine

## 2022-12-24 ENCOUNTER — Other Ambulatory Visit (HOSPITAL_COMMUNITY): Payer: Self-pay

## 2022-12-24 ENCOUNTER — Other Ambulatory Visit: Payer: Self-pay | Admitting: Student

## 2022-12-24 ENCOUNTER — Other Ambulatory Visit (HOSPITAL_BASED_OUTPATIENT_CLINIC_OR_DEPARTMENT_OTHER): Payer: Self-pay

## 2022-12-24 DIAGNOSIS — R0683 Snoring: Secondary | ICD-10-CM | POA: Insufficient documentation

## 2022-12-24 DIAGNOSIS — R5383 Other fatigue: Secondary | ICD-10-CM | POA: Insufficient documentation

## 2022-12-24 DIAGNOSIS — R0902 Hypoxemia: Secondary | ICD-10-CM | POA: Diagnosis not present

## 2022-12-25 ENCOUNTER — Other Ambulatory Visit (HOSPITAL_COMMUNITY): Payer: Self-pay

## 2022-12-25 ENCOUNTER — Other Ambulatory Visit: Payer: Self-pay | Admitting: Student

## 2022-12-25 MED ORDER — ALBUTEROL SULFATE HFA 108 (90 BASE) MCG/ACT IN AERS
2.0000 | INHALATION_SPRAY | RESPIRATORY_TRACT | 2 refills | Status: DC | PRN
  Filled 2022-12-25: qty 6.7, 25d supply, fill #0
  Filled 2023-01-10 (×2): qty 6.7, 25d supply, fill #1
  Filled 2023-01-25: qty 6.7, 25d supply, fill #2

## 2022-12-27 ENCOUNTER — Other Ambulatory Visit (HOSPITAL_COMMUNITY): Payer: Self-pay

## 2022-12-27 ENCOUNTER — Telehealth: Payer: Self-pay | Admitting: Student

## 2022-12-27 NOTE — Telephone Encounter (Signed)
Pt states he called the pharmacy and was told the following medication was denied.   albuterol (VENTOLIN HFA) 108 (90 Base) MCG/ACT inhaler  [facility] [place]

## 2023-01-01 DIAGNOSIS — R5383 Other fatigue: Secondary | ICD-10-CM | POA: Diagnosis not present

## 2023-01-01 NOTE — Procedures (Signed)
Patient Name: Francisco [name], Francisco [name] Date: 12/24/2022 Gender: Male D.O.B: [birth year] Age (years): 62 Referring Provider: Alvira Philips DO Height (inches): 70 Interpreting Physician: Jetty Duhamel MD, ABSM Weight (lbs): 180 RPSGT: Cherylann Parr BMI: 26 MRN: [id number] Neck Size: 16.00  CLINICAL INFORMATION Sleep Study Type: NPSG Indication for sleep study: Snoring, Witnesses Apnea / Gasping During Sleep Epworth Sleepiness Score: 6  SLEEP STUDY TECHNIQUE As per the AASM Manual for the Scoring of Sleep and Associated Events v2.3 (April 2016) with a hypopnea requiring 4% desaturations.  The channels recorded and monitored were frontal, central and occipital EEG, electrooculogram (EOG), submentalis EMG (chin), nasal and oral airflow, thoracic and abdominal wall motion, anterior tibialis EMG, snore microphone, electrocardiogram, and pulse oximetry.  MEDICATIONS Medications self-administered by patient taken the night of the study : bronchodilator  SLEEP ARCHITECTURE The study was initiated at 9:33:22 PM and ended at 4:22:59 AM.  Sleep onset time was 31.0 minutes and the sleep efficiency was 81.6%. The total sleep time was 334.1 minutes.  Stage REM latency was 55.5 minutes.  The patient spent 1.2% of the night in stage N1 sleep, 88.6% in stage N2 sleep, 0.0% in stage N3 and 10.2% in REM.  Alpha intrusion was absent.  Supine sleep was 63.38%.  RESPIRATORY PARAMETERS The overall apnea/hypopnea index (AHI) was 2.7 per hour. There were 11 total apneas, including 11 obstructive, 0 central and 0 mixed apneas. There were 4 hypopneas and 0 RERAs.  The AHI during Stage REM sleep was 7.1 per hour.  AHI while supine was 2.3 per hour.  The mean oxygen saturation was 91.0%. The minimum SpO2 during sleep was 76.0%.  moderate snoring was noted during this study.  CARDIAC DATA The 2 lead EKG demonstrated sinus rhythm. The mean heart rate was 89.5 beats per minute. Other EKG findings  include: None.  LEG MOVEMENT DATA The total PLMS were 0 with a resulting PLMS index of 0.0. Associated arousal with leg movement index was 0.0 .  IMPRESSIONS - No significant obstructive sleep apnea occurred during this study (AHI = 2.7/h). - Oxygen desaturation was noted during this study (Min O2 = 76.0%). Supplemental O2 2L added per protocol at 12:58 AM due to sustained low saturation </= 89%. - The patient snored with moderate snoring volume. - No cardiac abnormalities were noted during this study. - Clinically significant periodic limb movements did not occur during sleep. No significant associated arousals.  DIAGNOSIS - Nocturnal Hypoxemia (G47.36)  RECOMMENDATIONS - Manage for nocturnal hypoxemia based on clinical judgment. - Sleep hygiene should be reviewed to assess factors that may improve sleep quality. - Weight management and regular exercise should be initiated or continued if appropriate.  [Electronically signed] 01/01/2023 11:25 AM  Jetty Duhamel MD, ABSM Diplomate, American Board of Sleep Medicine NPI: [id number]                          Jetty Duhamel Diplomate, American Board of Sleep Medicine  ELECTRONICALLY SIGNED ON:  01/01/2023, 11:21 AM  [facility] PH: [phone]   FX: [phone] ACCREDITED BY THE AMERICAN ACADEMY OF SLEEP MEDICINE

## 2023-01-03 ENCOUNTER — Other Ambulatory Visit (HOSPITAL_COMMUNITY): Payer: Self-pay

## 2023-01-05 ENCOUNTER — Other Ambulatory Visit (HOSPITAL_COMMUNITY): Payer: Self-pay

## 2023-01-10 ENCOUNTER — Other Ambulatory Visit (HOSPITAL_COMMUNITY): Payer: Self-pay

## 2023-01-21 ENCOUNTER — Emergency Department (HOSPITAL_COMMUNITY)
Admission: EM | Admit: 2023-01-21 | Discharge: 2023-01-21 | Disposition: A | Discharge status: Home / Self Care | Payer: No Typology Code available for payment source | Attending: Emergency Medicine | Admitting: Emergency Medicine

## 2023-01-21 ENCOUNTER — Encounter (HOSPITAL_COMMUNITY): Payer: Self-pay

## 2023-01-21 ENCOUNTER — Emergency Department (HOSPITAL_COMMUNITY): Payer: No Typology Code available for payment source

## 2023-01-21 ENCOUNTER — Other Ambulatory Visit: Payer: Self-pay

## 2023-01-21 DIAGNOSIS — Z720 Tobacco use: Secondary | ICD-10-CM

## 2023-01-21 DIAGNOSIS — Z7982 Long term (current) use of aspirin: Secondary | ICD-10-CM | POA: Diagnosis not present

## 2023-01-21 DIAGNOSIS — Z7951 Long term (current) use of inhaled steroids: Secondary | ICD-10-CM | POA: Insufficient documentation

## 2023-01-21 DIAGNOSIS — J441 Chronic obstructive pulmonary disease with (acute) exacerbation: Secondary | ICD-10-CM | POA: Insufficient documentation

## 2023-01-21 DIAGNOSIS — I2510 Atherosclerotic heart disease of native coronary artery without angina pectoris: Secondary | ICD-10-CM | POA: Insufficient documentation

## 2023-01-21 DIAGNOSIS — Z1152 Encounter for screening for COVID-19: Secondary | ICD-10-CM | POA: Insufficient documentation

## 2023-01-21 DIAGNOSIS — F17200 Nicotine dependence, unspecified, uncomplicated: Secondary | ICD-10-CM | POA: Diagnosis not present

## 2023-01-21 DIAGNOSIS — I509 Heart failure, unspecified: Secondary | ICD-10-CM | POA: Diagnosis not present

## 2023-01-21 DIAGNOSIS — R0602 Shortness of breath: Secondary | ICD-10-CM | POA: Diagnosis present

## 2023-01-21 LAB — COMPREHENSIVE METABOLIC PANEL
ALT: 69 U/L — ABNORMAL HIGH (ref 0–44)
AST: 37 U/L (ref 15–41)
Albumin: 3.3 g/dL — ABNORMAL LOW (ref 3.5–5.0)
Alkaline Phosphatase: 73 U/L (ref 38–126)
Anion gap: 9 (ref 5–15)
BUN: 11 mg/dL (ref 8–23)
CO2: 27 mmol/L (ref 22–32)
Calcium: 9.2 mg/dL (ref 8.9–10.3)
Chloride: 102 mmol/L (ref 98–111)
Creatinine, Ser: 0.92 mg/dL (ref 0.61–1.24)
GFR, Estimated: >60 mL/min (ref >60)
Glucose, Bld: 103 mg/dL — ABNORMAL HIGH (ref 70–99)
Potassium: 3.3 mmol/L — ABNORMAL LOW (ref 3.5–5.1)
Sodium: 138 mmol/L (ref 135–145)
Total Bilirubin: 0.5 mg/dL (ref 0.3–1.2)
Total Protein: 6.6 g/dL (ref 6.5–8.1)

## 2023-01-21 LAB — CBC
HCT: 43 % (ref 39.0–52.0)
Hemoglobin: 14.2 g/dL (ref 13.0–17.0)
MCH: 31.1 pg (ref 26.0–34.0)
MCHC: 33 g/dL (ref 30.0–36.0)
MCV: 94.1 fL (ref 80.0–100.0)
Platelets: 255 10*3/uL (ref 150–400)
RBC: 4.57 MIL/uL (ref 4.22–5.81)
RDW: 13.1 % (ref 11.5–15.5)
WBC: 8 10*3/uL (ref 4.0–10.5)
nRBC: 0 % (ref 0.0–0.2)

## 2023-01-21 LAB — RESP PANEL BY RT-PCR (RSV, FLU A&B, COVID)  RVPGX2
Influenza A by PCR: NEGATIVE
Influenza B by PCR: NEGATIVE
Resp Syncytial Virus by PCR: NEGATIVE
SARS Coronavirus 2 by RT PCR: NEGATIVE

## 2023-01-21 MED ORDER — METHYLPREDNISOLONE 4 MG PO TBPK: ORAL_TABLET | Freq: Every day | ORAL | 0 refills | Status: DC

## 2023-01-21 MED ORDER — METHYLPREDNISOLONE SODIUM SUCC 125 MG IJ SOLR
125.0000 mg | Freq: Once | INTRAMUSCULAR | Status: AC
  Administered 2023-01-21: 125 mg via INTRAVENOUS
  Filled 2023-01-21: qty 2

## 2023-01-21 MED ORDER — ALBUTEROL SULFATE (2.5 MG/3ML) 0.083% IN NEBU
2.5000 mg | INHALATION_SOLUTION | Freq: Once | RESPIRATORY_TRACT | Status: AC
  Administered 2023-01-21: 2.5 mg via RESPIRATORY_TRACT
  Filled 2023-01-21: qty 3

## 2023-01-21 MED ORDER — IPRATROPIUM-ALBUTEROL 0.5-2.5 (3) MG/3ML IN SOLN
3.0000 mL | Freq: Once | RESPIRATORY_TRACT | Status: AC
  Administered 2023-01-21: 3 mL via RESPIRATORY_TRACT
  Filled 2023-01-21: qty 3

## 2023-01-21 NOTE — ED Triage Notes (Signed)
Pt c/o [id number]. Pt denies any other sx.

## 2023-01-21 NOTE — ED Provider Notes (Signed)
[place] EMERGENCY DEPARTMENT AT [facility] Provider Note   CSN: [id number] Arrival date & time: 01/21/23  1851     History  Chief Complaint  Patient presents with   Shortness of Breath    Francisco [name] is a 62 y.o. male.  Pt is a 62 yo male with pmhx significant for copd, cad, hx ivda, and chf.  Pt said he's been sob for the past 2 days.  He's been using his inhalers and his nebs without improvement.  No fevers.         Home Medications Prior to Admission medications   Medication Sig Start Date End Date Taking? Authorizing Provider  methylPREDNISolone (MEDROL DOSEPAK) 4 MG TBPK tablet Take by mouth daily. Day 1:  2 pills at breakfast, 1 pill at lunch, 1 pill after supper, 2 pills at bedtime;Day 2:  1 pill at breakfast, 1 pill at lunch, 1 pill after supper, 2 pills at bedtime;Day 3:  1 pill at breakfast, 1 pill at lunch, 1 pill after supper, 1 pill at bedtime;Day 4:  1 pill at breakfast, 1 pill at lunch, 1 pill at bedtime;Day 5:  1 pill at breakfast, 1 pill at bedtime;Day 6:  1 pill at breakfast 01/21/23  Yes Jacalyn Lefevre, MD  acetaminophen (TYLENOL) 325 MG tablet Take 650 mg by mouth every 6 (six) hours as needed for moderate pain or headache.    [provider]  albuterol (PROVENTIL) (2.5 MG/3ML) 0.083% nebulizer solution Use 1 vial (2.5 mg total) by nebulization every 6 (six) hours as needed for wheezing or shortness of breath. 11/04/22   Nooruddin, Jason Fila, MD  albuterol (VENTOLIN HFA) 108 (90 Base) MCG/ACT inhaler Inhale 2 puffs into the lungs every 4 (four) hours as needed for wheezing or shortness of breath. 12/25/22   Nooruddin, Jason Fila, MD  amLODipine (NORVASC) 5 MG tablet Take 1 tablet (5 mg total) by mouth daily. 11/29/22   Nooruddin, Jason Fila, MD  aspirin EC 81 MG tablet Take 81 mg by mouth daily. Swallow whole.    [provider]  atorvastatin (LIPITOR) 40 MG tablet Take 1 tablet (40 mg total) by mouth daily. 11/09/22   Nooruddin, Jason Fila, MD   Fluticasone-Umeclidin-Vilant (TRELEGY ELLIPTA) 100-62.5-25 MCG/ACT AEPB Please use 1 puff daily. 09/27/22   Nooruddin, Jason Fila, MD  furosemide (LASIX) 40 MG tablet Take 1 tablet (40 mg total) by mouth daily. 05/05/22   Adron Bene, MD  lisinopril (ZESTRIL) 40 MG tablet Take 1/2 tablet (20 mg total) by mouth daily. 08/25/22   Quincy Simmonds, MD  metoprolol succinate (TOPROL-XL) 25 MG 24 hr tablet Take 1 tablet (25 mg total) by mouth in the morning. 11/29/22   Nooruddin, Jason Fila, MD  nicotine (NICODERM CQ - DOSED IN MG/24 HOURS) 14 mg/24hr patch Place 1 patch (14 mg total) onto the skin daily. Patient not taking: Reported on 08/13/2021 06/24/21   Adron Bene, MD  nitroGLYCERIN (NITROSTAT) 0.4 MG SL tablet Place 1 tablet (0.4 mg total) under the tongue every 5 (five) minutes x 3 doses as needed for chest pain. 10/14/22   Nooruddin, Jason Fila, MD  predniSONE (DELTASONE) 20 MG tablet Take 2 tablets (40 mg total) by mouth daily. 12/11/22   Prosperi, Christian H, PA-C      Allergies    Ivp dye [iodinated contrast media] and Tramadol    Review of Systems   Review of Systems  Respiratory:  Positive for cough and shortness of breath.     Physical Exam Updated Vital Signs BP Marland Kitchen)  181/108   Pulse 94   Temp 98.3 F (36.8 C) (Oral)   Resp (!) 22   Ht 6' (1.829 m)   Wt 81.6 kg   SpO2 91%   BMI 24.40 kg/m  Physical Exam Vitals and nursing note reviewed.  Constitutional:      Appearance: He is well-developed.  HENT:     Head: Normocephalic and atraumatic.     Mouth/Throat:     Mouth: Mucous membranes are moist.     Pharynx: Oropharynx is clear.  Eyes:     Extraocular Movements: Extraocular movements intact.     Pupils: Pupils are equal, round, and reactive to light.  Cardiovascular:     Rate and Rhythm: Regular rhythm. Tachycardia present.  Pulmonary:     Breath sounds: Decreased breath sounds and wheezing present.  Abdominal:     General: Bowel sounds are normal.     Palpations: Abdomen is  soft.  Musculoskeletal:        General: Normal range of motion.     Cervical back: Normal range of motion and neck supple.  Skin:    General: Skin is warm.     Capillary Refill: Capillary refill takes less than 2 seconds.  Neurological:     General: No focal deficit present.     Mental Status: He is alert and oriented to person, place, and time.  Psychiatric:        Mood and Affect: Mood normal.        Behavior: Behavior normal.     ED Results / Procedures / Treatments   Labs (all labs ordered are listed, but only abnormal results are displayed) Labs Reviewed  COMPREHENSIVE METABOLIC PANEL - Abnormal; Notable for the following components:      Result Value   Potassium 3.3 (*)    Glucose, Bld 103 (*)    Albumin 3.3 (*)    ALT 69 (*)    All other components within normal limits  RESP PANEL BY RT-PCR (RSV, FLU A&B, COVID)  RVPGX2  CBC    EKG EKG Interpretation Date/Time:  Friday January 21 2023 19:02:59 EDT Ventricular Rate:  108 PR Interval:  126 QRS Duration:  76 QT Interval:  346 QTC Calculation: 463 R Axis:   62  Text Interpretation: Sinus tachycardia Otherwise normal ECG When compared with ECG of 18-Dec-2022 14:14, PREVIOUS ECG IS PRESENT No significant change since last tracing Confirmed by Jacalyn Lefevre [phone]) on 01/21/2023 7:55:45 PM  Radiology DG Chest Port 1 View  Result Date: 01/21/2023 CLINICAL DATA:  Shortness of breath EXAM: PORTABLE CHEST 1 VIEW COMPARISON:  12/18/2022 FINDINGS: The heart size and mediastinal contours are within normal limits. Both lungs are clear. The visualized skeletal structures are unremarkable. IMPRESSION: No active disease. Electronically Signed   By: Minerva Fester M.D.   On: 01/21/2023 21:23    Procedures Procedures    Medications Ordered in ED Medications  methylPREDNISolone sodium succinate (SOLU-MEDROL) 125 mg/2 mL injection 125 mg (125 mg Intravenous Given 01/21/23 2013)  ipratropium-albuterol (DUONEB) 0.5-2.5 (3)  MG/3ML nebulizer solution 3 mL (3 mLs Nebulization Given 01/21/23 1929)  albuterol (PROVENTIL) (2.5 MG/3ML) 0.083% nebulizer solution 2.5 mg (2.5 mg Nebulization Given 01/21/23 1929)    ED Course/ Medical Decision Making/ A&P                                 Medical Decision Making Amount and/or Complexity of Data Reviewed Labs:  ordered. Radiology: ordered.  Risk Prescription drug management.   This patient presents to the ED for concern of sob, this involves an extensive number of treatment options, and is a complaint that carries with it a high risk of complications and morbidity.  The differential diagnosis includes copd exac, pna, covid, chf, cad   Co morbidities that complicate the patient evaluation  copd, cad, hx ivda, and chf   Additional history obtained:  Additional history obtained from epic chart review  Lab Tests:  I Ordered, and personally interpreted labs.  The pertinent results include:  cbc nl, cmp with k sl low at 3.3, covid/flu/rsv neg   Imaging Studies ordered:  I ordered imaging studies including cxr  I independently visualized and interpreted imaging which showed No active disease.  I agree with the radiologist interpretation   Cardiac Monitoring:  The patient was maintained on a cardiac monitor.  I personally viewed and interpreted the cardiac monitored which showed an underlying rhythm of: nsr   Medicines ordered and prescription drug management:  I ordered medication including nebs/solumedrol  for sx  Reevaluation of the patient after these medicines showed that the patient improved I have reviewed the patients home medicines and have made adjustments as needed   Test Considered:  cxr   Critical Interventions:  Nebs/steroids   Problem List / ED Course:  COPD exac:  pt is feeling much better after steroids and nebs.  He is able to ambulate without any problems and O2 sat stayed 97%.  Pt is stable for d/c.  He is encouraged to stop  smoking.  He is to return if worse. F/u with pcp.   Reevaluation:  After the interventions noted above, I reevaluated the patient and found that they have :improved   Social Determinants of Health:  Lives at home   Dispostion:  After consideration of the diagnostic results and the patients response to treatment, I feel that the patent would benefit from discharge with outpatient f/u.          Final Clinical Impression(s) / ED Diagnoses Final diagnoses:  COPD exacerbation (HCC)  Tobacco abuse    Rx / DC Orders ED Discharge Orders          Ordered    methylPREDNISolone (MEDROL DOSEPAK) 4 MG TBPK tablet  Daily        01/21/23 2136              Jacalyn Lefevre, MD 01/21/23 2149

## 2023-01-21 NOTE — ED Notes (Signed)
PT ambulated to the bathroom and back,maintained a O2 of 97%

## 2023-01-21 NOTE — Discharge Instructions (Addendum)
Try to stop smoking. °

## 2023-01-25 ENCOUNTER — Other Ambulatory Visit (HOSPITAL_COMMUNITY): Payer: Self-pay

## 2023-01-25 ENCOUNTER — Other Ambulatory Visit: Payer: Self-pay

## 2023-01-28 ENCOUNTER — Emergency Department (HOSPITAL_COMMUNITY)
Admission: EM | Admit: 2023-01-28 | Discharge: 2023-01-28 | Disposition: A | Discharge status: Home / Self Care | Payer: No Typology Code available for payment source | Attending: Emergency Medicine | Admitting: Emergency Medicine

## 2023-01-28 ENCOUNTER — Encounter (HOSPITAL_COMMUNITY): Payer: Self-pay

## 2023-01-28 ENCOUNTER — Emergency Department (HOSPITAL_COMMUNITY): Payer: No Typology Code available for payment source

## 2023-01-28 ENCOUNTER — Other Ambulatory Visit: Payer: Self-pay

## 2023-01-28 ENCOUNTER — Emergency Department (HOSPITAL_COMMUNITY): Admission: EM | Admit: 2023-01-28 | Payer: No Typology Code available for payment source | Source: Home / Self Care

## 2023-01-28 DIAGNOSIS — R000 Tachycardia, unspecified: Secondary | ICD-10-CM | POA: Insufficient documentation

## 2023-01-28 DIAGNOSIS — I2510 Atherosclerotic heart disease of native coronary artery without angina pectoris: Secondary | ICD-10-CM | POA: Diagnosis not present

## 2023-01-28 DIAGNOSIS — Z7951 Long term (current) use of inhaled steroids: Secondary | ICD-10-CM | POA: Insufficient documentation

## 2023-01-28 DIAGNOSIS — R0602 Shortness of breath: Secondary | ICD-10-CM | POA: Diagnosis present

## 2023-01-28 DIAGNOSIS — I5043 Acute on chronic combined systolic (congestive) and diastolic (congestive) heart failure: Secondary | ICD-10-CM | POA: Diagnosis not present

## 2023-01-28 DIAGNOSIS — J441 Chronic obstructive pulmonary disease with (acute) exacerbation: Secondary | ICD-10-CM | POA: Diagnosis not present

## 2023-01-28 DIAGNOSIS — Z79899 Other long term (current) drug therapy: Secondary | ICD-10-CM | POA: Diagnosis not present

## 2023-01-28 DIAGNOSIS — F17210 Nicotine dependence, cigarettes, uncomplicated: Secondary | ICD-10-CM | POA: Insufficient documentation

## 2023-01-28 DIAGNOSIS — I110 Hypertensive heart disease with heart failure: Secondary | ICD-10-CM | POA: Diagnosis not present

## 2023-01-28 DIAGNOSIS — Z7982 Long term (current) use of aspirin: Secondary | ICD-10-CM | POA: Diagnosis not present

## 2023-01-28 DIAGNOSIS — Z951 Presence of aortocoronary bypass graft: Secondary | ICD-10-CM | POA: Insufficient documentation

## 2023-01-28 LAB — CBC WITH DIFFERENTIAL/PLATELET
Abs Immature Granulocytes: 0.09 10*3/uL — ABNORMAL HIGH (ref 0.00–0.07)
Basophils Absolute: 0.1 10*3/uL (ref 0.0–0.1)
Basophils Relative: 1 %
Eosinophils Absolute: 0.1 10*3/uL (ref 0.0–0.5)
Eosinophils Relative: 1 %
HCT: 48.4 % (ref 39.0–52.0)
Hemoglobin: 15.9 g/dL (ref 13.0–17.0)
Immature Granulocytes: 1 %
Lymphocytes Relative: 17 %
Lymphs Abs: 1.3 10*3/uL (ref 0.7–4.0)
MCH: 31.4 pg (ref 26.0–34.0)
MCHC: 32.9 g/dL (ref 30.0–36.0)
MCV: 95.5 fL (ref 80.0–100.0)
Monocytes Absolute: 0.9 10*3/uL (ref 0.1–1.0)
Monocytes Relative: 13 %
Neutro Abs: 4.9 10*3/uL (ref 1.7–7.7)
Neutrophils Relative %: 67 %
Platelets: 247 10*3/uL (ref 150–400)
RBC: 5.07 MIL/uL (ref 4.22–5.81)
RDW: 13.5 % (ref 11.5–15.5)
WBC: 7.3 10*3/uL (ref 4.0–10.5)
nRBC: 0 % (ref 0.0–0.2)

## 2023-01-28 LAB — I-STAT VENOUS BLOOD GAS, ED
Acid-Base Excess: 3 mmol/L — ABNORMAL HIGH (ref 0.0–2.0)
Bicarbonate: 28.9 mmol/L — ABNORMAL HIGH (ref 20.0–28.0)
Calcium, Ion: 1.12 mmol/L — ABNORMAL LOW (ref 1.15–1.40)
HCT: 49 % (ref 39.0–52.0)
Hemoglobin: 16.7 g/dL (ref 13.0–17.0)
O2 Saturation: 62 %
Potassium: 3.4 mmol/L — ABNORMAL LOW (ref 3.5–5.1)
Sodium: 136 mmol/L (ref 135–145)
TCO2: 30 mmol/L (ref 22–32)
pCO2, Ven: 47.4 mm[Hg] (ref 44–60)
pH, Ven: 7.393 (ref 7.25–7.43)
pO2, Ven: 33 mm[Hg] (ref 32–45)

## 2023-01-28 LAB — COMPREHENSIVE METABOLIC PANEL
ALT: 48 U/L — ABNORMAL HIGH (ref 0–44)
AST: 29 U/L (ref 15–41)
Albumin: 3.6 g/dL (ref 3.5–5.0)
Alkaline Phosphatase: 59 U/L (ref 38–126)
Anion gap: 12 (ref 5–15)
BUN: 13 mg/dL (ref 8–23)
CO2: 26 mmol/L (ref 22–32)
Calcium: 9.3 mg/dL (ref 8.9–10.3)
Chloride: 99 mmol/L (ref 98–111)
Creatinine, Ser: 0.98 mg/dL (ref 0.61–1.24)
GFR, Estimated: >60 mL/min (ref >60)
Glucose, Bld: 104 mg/dL — ABNORMAL HIGH (ref 70–99)
Potassium: 3.5 mmol/L (ref 3.5–5.1)
Sodium: 137 mmol/L (ref 135–145)
Total Bilirubin: 0.7 mg/dL (ref 0.3–1.2)
Total Protein: 6.6 g/dL (ref 6.5–8.1)

## 2023-01-28 LAB — TROPONIN I (HIGH SENSITIVITY): Troponin I (High Sensitivity): 11 ng/L (ref <18)

## 2023-01-28 LAB — BRAIN NATRIURETIC PEPTIDE: B Natriuretic Peptide: 35.4 pg/mL (ref 0.0–100.0)

## 2023-01-28 MED ORDER — PREDNISONE 50 MG PO TABS: 50.0000 mg | ORAL_TABLET | Freq: Every day | ORAL | 0 refills | Status: DC

## 2023-01-28 MED ORDER — DOXYCYCLINE HYCLATE 100 MG PO CAPS: 100.0000 mg | ORAL_CAPSULE | Freq: Two times a day (BID) | ORAL | 0 refills | Status: DC

## 2023-01-28 MED ORDER — IPRATROPIUM-ALBUTEROL 0.5-2.5 (3) MG/3ML IN SOLN
3.0000 mL | Freq: Once | RESPIRATORY_TRACT | Status: AC
  Administered 2023-01-28: 3 mL via RESPIRATORY_TRACT
  Filled 2023-01-28: qty 3

## 2023-01-28 NOTE — ED Triage Notes (Signed)
Patient Francisco [name] comes in with shortness of breath. Francisco [name] it started to be bad yesterday evening but was worse this evening. He also reports chest pain. He took a nitroglycerin at home for the chest pain and it went from a 9 to a 7/10. EMS gave him two nebulizer treatments, 125 mg of Solumedrol, 324 of aspirin, and another sublingual nitroglycerine. Pt has a hx of methamphetamine use, COPD, and cardiac hx with placement of stents. Patient is alert and oriented. Has slight work of breathing. Has some mild wheezing in the bases.

## 2023-01-28 NOTE — Discharge Instructions (Addendum)
We evaluated you for your shortness of breath.  Your symptoms are most likely due to a COPD exacerbation.  Your symptoms improved with breathing treatments and steroids.  I have prescribed you a 5-day course of steroids as well as a 7-day course of antibiotics for your COPD exacerbation.  Please call your pulmonologist to schedule follow-up.  If you have any new or worsening symptoms such as severe pain, difficulty breathing, fainting, fevers or chills, lightheadedness or dizziness, leg swelling, or any other new symptoms, please return to the emergency department.

## 2023-01-29 NOTE — ED Provider Notes (Signed)
[place] EMERGENCY DEPARTMENT AT [facility] Provider Note  CSN: [id number] Arrival date & time: 01/28/23 2055  Chief Complaint(s) Shortness of Breath and Chest Pain  HPI Francisco [name] is a 62 y.o. male hx COPD, CAD, CHF presenting with SOB. Began yesterday, worsened today. Having chest tightness which began yesterday and worsened around 2. Reports productive cough with clear sputum. No fevers, chills, leg swelling, recent travel, surgeries, nausea, vomiting, diarrhea, syncope, palpitations. Symptoms improved significantly with steroids provided by EMS and breathing treatment. Feels like prior COPD exacerbations.    Past Medical History Past Medical History:  Diagnosis Date   CHF (congestive heart failure) (HCC)    COPD (chronic obstructive pulmonary disease) (HCC)    COPD with acute exacerbation (HCC) 10/21/2021   Coronary artery disease    a. s/p prior PCI.   Hepatitis-C    Hypertension    IV drug abuse (HCC)    a. 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 Authorizing Provider  doxycycline (VIBRAMYCIN) 100 MG capsule Take 1 capsule (100 mg total) by mouth 2 (two) times daily for 7 days. 01/28/23  02/04/23 Yes Lonell Grandchild, MD  acetaminophen (TYLENOL) 325 MG tablet Take 650 mg by mouth every 6 (six) hours as needed for moderate pain or headache.    [provider]  albuterol (PROVENTIL) (2.5 MG/3ML) 0.083% nebulizer solution Use 1 vial (2.5 mg total) by nebulization every 6 (six) hours as needed for wheezing or shortness of breath. 11/04/22   Nooruddin, Jason Fila, MD  albuterol (VENTOLIN HFA) 108 (90 Base) MCG/ACT inhaler Inhale 2 puffs into the lungs every 4 (four) hours as needed for wheezing or shortness of breath. 12/25/22   Nooruddin, Jason Fila, MD  amLODipine (NORVASC) 5 MG tablet Take 1 tablet (5 mg total) by mouth daily. 11/29/22   Nooruddin, Jason Fila, MD  aspirin EC 81 MG tablet Take 81 mg by mouth daily. Swallow whole.    [provider]  atorvastatin (LIPITOR) 40 MG tablet Take 1 tablet (40 mg total) by mouth daily. 11/09/22   Nooruddin, Jason Fila, MD  Fluticasone-Umeclidin-Vilant (TRELEGY ELLIPTA) 100-62.5-25 MCG/ACT AEPB Please use 1 puff daily. 09/27/22   Nooruddin, Jason Fila, MD  furosemide (LASIX) 40 MG tablet Take 1 tablet (40 mg total) by mouth daily. 05/05/22   Adron Bene, MD  lisinopril (ZESTRIL) 40 MG tablet Take 1/2 tablet (20 mg total) by mouth daily. 08/25/22   Quincy Simmonds, MD  metoprolol succinate (TOPROL-XL) 25 MG 24 hr tablet Take 1 tablet (25 mg total) by mouth in the morning. 11/29/22   Nooruddin, Jason Fila, MD  nicotine (NICODERM CQ - DOSED IN MG/24 HOURS) 14 mg/24hr patch Place 1 patch (14 mg total) onto the skin daily.  Patient not taking: Reported on 08/13/2021 06/24/21   Adron Bene, MD  nitroGLYCERIN (NITROSTAT) 0.4 MG SL tablet Place 1 tablet (0.4 mg total) under the tongue every 5 (five) minutes x 3 doses as needed for chest pain. 10/14/22   Nooruddin, Jason Fila, MD  predniSONE (DELTASONE) 50 MG tablet Take 1 tablet (50 mg total) by mouth daily for 5 days. 01/28/23 02/02/23  Lonell Grandchild, MD                                                                                                                                     Past Surgical History Past Surgical History:  Procedure Laterality Date   BACK SURGERY     NECK SURGERY     RIGHT/LEFT HEART CATH AND CORONARY ANGIOGRAPHY N/A 04/30/2019   Procedure: RIGHT/LEFT HEART CATH AND CORONARY ANGIOGRAPHY;  Surgeon: Marykay Lex, MD;  Location: [facility] INVASIVE CV LAB;  Service: Cardiovascular;  Laterality: N/A;   stints     Family History Family History  Problem Relation Age of Onset   Rheum arthritis Mother    Heart disease Paternal Grandfather     Social History Social History   Tobacco Use   Smoking status: Every Day    Current packs/day: 0.50    Types: Cigarettes   Smokeless tobacco: Never  Vaping Use   Vaping status: Never Used  Substance Use Topics   Alcohol use: Never   Drug use: Yes    Types: Methamphetamines   Allergies Ivp dye [iodinated contrast media] and Tramadol  Review of Systems Review of Systems  All other systems reviewed and are negative.   Physical Exam Vital Signs  I have reviewed the triage vital signs BP (!) 161/90   Pulse 100   Temp 98.4 F (36.9 C)   Resp (!) 24   Ht 6' (1.829 m)   Wt 83.9 kg   SpO2 93%   BMI 25.09 kg/m  Physical Exam Vitals and nursing note reviewed.  Constitutional:      General: He is not in acute distress.    Appearance: Normal appearance.  HENT:     Mouth/Throat:     Mouth: Mucous membranes are moist.  Eyes:     Conjunctiva/sclera: Conjunctivae normal.  Cardiovascular:     Rate and Rhythm: Regular rhythm. Tachycardia present.  Pulmonary:     Effort: Pulmonary effort is normal. No respiratory distress.     Breath sounds: Wheezing present.  Abdominal:     General: Abdomen is flat.     Palpations: Abdomen is soft.     Tenderness: There is no abdominal tenderness.  Musculoskeletal:     Right lower leg: No edema.     Left lower leg: No edema.  Skin:    General: Skin is warm and dry.     Capillary Refill: Capillary  refill takes less than 2 seconds.  Neurological:     Mental Status:  He is alert and oriented to person, place, and time. Mental status is at baseline.  Psychiatric:        Mood and Affect: Mood normal.        Behavior: Behavior normal.     ED Results and Treatments Labs (all labs ordered are listed, but only abnormal results are displayed) Labs Reviewed  COMPREHENSIVE METABOLIC PANEL - Abnormal; Notable for the following components:      Result Value   Glucose, Bld 104 (*)    ALT 48 (*)    All other components within normal limits  CBC WITH DIFFERENTIAL/PLATELET - Abnormal; Notable for the following components:   Abs Immature Granulocytes 0.09 (*)    All other components within normal limits  I-STAT VENOUS BLOOD GAS, ED - Abnormal; Notable for the following components:   Bicarbonate 28.9 (*)    Acid-Base Excess 3.0 (*)    Potassium 3.4 (*)    Calcium, Ion 1.12 (*)    All other components within normal limits  BRAIN NATRIURETIC PEPTIDE  TROPONIN I (HIGH SENSITIVITY)                                                                                                                          Radiology DG Chest Port 1 View  Result Date: 01/28/2023 CLINICAL DATA:  Chest pain and shortness of breath. EXAM: PORTABLE CHEST 1 VIEW COMPARISON:  01/21/2023. FINDINGS: The heart size and mediastinal contours are within normal limits. There is atherosclerotic calcification of the aorta. Hyperinflation of the lungs is noted. No consolidation, effusion, or pneumothorax. No acute osseous abnormality. IMPRESSION: No active disease. Electronically Signed   By: Thornell Sartorius M.D.   On: 01/28/2023 23:50    Pertinent labs & imaging results that were available during my care of the patient were reviewed by me and considered in my medical decision making (see MDM for details).  Medications Ordered in ED Medications  ipratropium-albuterol (DUONEB) 0.5-2.5 (3) MG/3ML nebulizer solution 3 mL (3 mLs  Nebulization Given 01/28/23 2142)                                                                                                                                     Procedures Procedures  (including critical care time)  Medical Decision Making / ED Course   MDM:  62 y/o presenting with shortness of breath, chest pain.   Patient well  appearing, vitals with mild tachycardia. Was placed on oxygen by RN but when removed patient > 90%. Does have wheezing on exam, which improved with additional breathing treatment. Patient reports he feels back to baseline and requesting discharge. Will prescribe antibiotics and steroids for COPD exacerbation. Considered alternative causes of shortness of breath/chest pain but overall very low concern for other process such as ACS, PNA, PTX, dissection, esophageal pathology, pneumonia, pulmonary embolism .      Additional history obtained: -Additional history obtained from ems -External records from outside source obtained and reviewed including: Chart review including previous notes, labs, imaging, consultation notes including prior ER notes    Lab Tests: -I ordered, reviewed, and interpreted labs.   The pertinent results include:   Labs Reviewed  COMPREHENSIVE METABOLIC PANEL - Abnormal; Notable for the following components:      Result Value   Glucose, Bld 104 (*)    ALT 48 (*)    All other components within normal limits  CBC WITH DIFFERENTIAL/PLATELET - Abnormal; Notable for the following components:   Abs Immature Granulocytes 0.09 (*)    All other components within normal limits  I-STAT VENOUS BLOOD GAS, ED - Abnormal; Notable for the following components:   Bicarbonate 28.9 (*)    Acid-Base Excess 3.0 (*)    Potassium 3.4 (*)    Calcium, Ion 1.12 (*)    All other components within normal limits  BRAIN NATRIURETIC PEPTIDE  TROPONIN I (HIGH SENSITIVITY)    Notable for normal troponin with > 2 hr symptoms, no hypercapnea  EKG   EKG  Interpretation Date/Time:  Friday January 28 2023 20:55:53 EDT Ventricular Rate:  108 PR Interval:  138 QRS Duration:  71 QT Interval:  311 QTC Calculation: 417 R Axis:   53  Text Interpretation: Sinus tachycardia Right atrial enlargement Confirmed by Alvino Blood ([id number]) on 01/28/2023 10:48:26 PM         Imaging Studies ordered: I ordered imaging studies including CXR On my interpretation imaging demonstrates no acute process I independently visualized and interpreted imaging. I agree with the radiologist interpretation   Medicines ordered and prescription drug management: Meds ordered this encounter  Medications   ipratropium-albuterol (DUONEB) 0.5-2.5 (3) MG/3ML nebulizer solution 3 mL   predniSONE (DELTASONE) 50 MG tablet    Sig: Take 1 tablet (50 mg total) by mouth daily for 5 days.    Dispense:  5 tablet    Refill:  0   doxycycline (VIBRAMYCIN) 100 MG capsule    Sig: Take 1 capsule (100 mg total) by mouth 2 (two) times daily for 7 days.    Dispense:  14 capsule    Refill:  0    -I have reviewed the patients home medicines and have made adjustments as needed   Cardiac Monitoring: The patient was maintained on a cardiac monitor.  I personally viewed and interpreted the cardiac monitored which showed an underlying rhythm of: NSR  Social Determinants of Health:  Diagnosis or treatment significantly limited by social determinants of health: current smoker   Reevaluation: After the interventions noted above, I reevaluated the patient and found that their symptoms have improved  Co morbidities that complicate the patient evaluation  Past Medical History:  Diagnosis Date   CHF (congestive heart failure) (HCC)    COPD (chronic obstructive pulmonary disease) (HCC)    COPD with acute exacerbation (HCC) 10/21/2021   Coronary artery disease    a. s/p prior PCI.   Hepatitis-C    Hypertension  IV drug abuse (HCC)    a. previously heroin, now methamphetamine  (04/2019).   Medically noncompliant    MI (myocardial infarction) (HCC)    Tobacco abuse       Dispostion: Disposition decision including need for hospitalization was considered, and patient discharged from emergency department.    Final Clinical Impression(s) / ED Diagnoses Final diagnoses:  COPD exacerbation (HCC)     This chart was dictated using voice recognition software.  Despite best efforts to proofread,  errors can occur which can change the documentation meaning.    Lonell Grandchild, MD 01/29/23 1302

## 2023-01-31 ENCOUNTER — Emergency Department (HOSPITAL_COMMUNITY): Payer: No Typology Code available for payment source

## 2023-01-31 ENCOUNTER — Emergency Department (HOSPITAL_COMMUNITY)
Admission: EM | Admit: 2023-01-31 | Discharge: 2023-01-31 | Disposition: A | Discharge status: Home / Self Care | Payer: No Typology Code available for payment source | Attending: Emergency Medicine | Admitting: Emergency Medicine

## 2023-01-31 ENCOUNTER — Telehealth: Payer: Self-pay | Admitting: *Deleted

## 2023-01-31 DIAGNOSIS — I509 Heart failure, unspecified: Secondary | ICD-10-CM | POA: Insufficient documentation

## 2023-01-31 DIAGNOSIS — Z7982 Long term (current) use of aspirin: Secondary | ICD-10-CM | POA: Insufficient documentation

## 2023-01-31 DIAGNOSIS — D72829 Elevated white blood cell count, unspecified: Secondary | ICD-10-CM | POA: Insufficient documentation

## 2023-01-31 DIAGNOSIS — Z1152 Encounter for screening for COVID-19: Secondary | ICD-10-CM | POA: Insufficient documentation

## 2023-01-31 DIAGNOSIS — R0602 Shortness of breath: Secondary | ICD-10-CM | POA: Insufficient documentation

## 2023-01-31 DIAGNOSIS — J441 Chronic obstructive pulmonary disease with (acute) exacerbation: Secondary | ICD-10-CM | POA: Insufficient documentation

## 2023-01-31 LAB — CBC WITH DIFFERENTIAL/PLATELET
Abs Immature Granulocytes: 0.07 10*3/uL (ref 0.00–0.07)
Basophils Absolute: 0 10*3/uL (ref 0.0–0.1)
Basophils Relative: 0 %
Eosinophils Absolute: 0 10*3/uL (ref 0.0–0.5)
Eosinophils Relative: 0 %
HCT: 49.9 % (ref 39.0–52.0)
Hemoglobin: 15.7 g/dL (ref 13.0–17.0)
Immature Granulocytes: 1 %
Lymphocytes Relative: 15 %
Lymphs Abs: 1.6 10*3/uL (ref 0.7–4.0)
MCH: 31.2 pg (ref 26.0–34.0)
MCHC: 31.5 g/dL (ref 30.0–36.0)
MCV: 99.2 fL (ref 80.0–100.0)
Monocytes Absolute: 1.4 10*3/uL — ABNORMAL HIGH (ref 0.1–1.0)
Monocytes Relative: 13 %
Neutro Abs: 8 10*3/uL — ABNORMAL HIGH (ref 1.7–7.7)
Neutrophils Relative %: 71 %
Platelets: 276 10*3/uL (ref 150–400)
RBC: 5.03 MIL/uL (ref 4.22–5.81)
RDW: 13.7 % (ref 11.5–15.5)
WBC: 11.1 10*3/uL — ABNORMAL HIGH (ref 4.0–10.5)
nRBC: 0 % (ref 0.0–0.2)

## 2023-01-31 LAB — COMPREHENSIVE METABOLIC PANEL
ALT: 41 U/L (ref 0–44)
AST: 19 U/L (ref 15–41)
Albumin: 3.9 g/dL (ref 3.5–5.0)
Alkaline Phosphatase: 59 U/L (ref 38–126)
Anion gap: 9 (ref 5–15)
BUN: 17 mg/dL (ref 8–23)
CO2: 30 mmol/L (ref 22–32)
Calcium: 8.9 mg/dL (ref 8.9–10.3)
Chloride: 101 mmol/L (ref 98–111)
Creatinine, Ser: 1.02 mg/dL (ref 0.61–1.24)
GFR, Estimated: >60 mL/min (ref >60)
Glucose, Bld: 111 mg/dL — ABNORMAL HIGH (ref 70–99)
Potassium: 3.5 mmol/L (ref 3.5–5.1)
Sodium: 140 mmol/L (ref 135–145)
Total Bilirubin: 0.5 mg/dL (ref 0.3–1.2)
Total Protein: 7 g/dL (ref 6.5–8.1)

## 2023-01-31 LAB — RESP PANEL BY RT-PCR (RSV, FLU A&B, COVID)  RVPGX2
Influenza A by PCR: NEGATIVE
Influenza B by PCR: NEGATIVE
Resp Syncytial Virus by PCR: NEGATIVE
SARS Coronavirus 2 by RT PCR: NEGATIVE

## 2023-01-31 LAB — TROPONIN I (HIGH SENSITIVITY): Troponin I (High Sensitivity): 12 ng/L (ref <18)

## 2023-01-31 LAB — BRAIN NATRIURETIC PEPTIDE: B Natriuretic Peptide: 55.9 pg/mL (ref 0.0–100.0)

## 2023-01-31 MED ORDER — IPRATROPIUM-ALBUTEROL 0.5-2.5 (3) MG/3ML IN SOLN
RESPIRATORY_TRACT | Status: AC
  Administered 2023-01-31: 9 mL
  Filled 2023-01-31: qty 9

## 2023-01-31 MED ORDER — PREDNISONE 50 MG PO TABS
50.0000 mg | ORAL_TABLET | Freq: Every day | ORAL | 0 refills | Status: DC
Start: 2023-01-31 — End: 2023-02-11

## 2023-01-31 MED ORDER — MAGNESIUM SULFATE 2 GM/50ML IV SOLN
2.0000 g | Freq: Once | INTRAVENOUS | Status: AC
  Administered 2023-01-31: 2 g via INTRAVENOUS
  Filled 2023-01-31: qty 50

## 2023-01-31 MED ORDER — IPRATROPIUM-ALBUTEROL 0.5-2.5 (3) MG/3ML IN SOLN
9.0000 mL | Freq: Once | RESPIRATORY_TRACT | Status: AC
  Administered 2023-01-31: 9 mL via RESPIRATORY_TRACT
  Filled 2023-01-31: qty 3

## 2023-01-31 NOTE — ED Triage Notes (Signed)
Pt BIBA from home with c/o SOB. Hx COPD. Wheezing in all field. 88% RA. 99% on duoneb. 10 albuterol. 0.5 atrovent. 125 solu-medrol. 20G LAC. Took nitroglycerin before EMS arrived but denies chest pain at this time.   170/86-HR 116-RR 22

## 2023-01-31 NOTE — Discharge Instructions (Addendum)
Today you were treated for COPD exacerbation.  After shared decision-making you decided you would like to go home.  Please take your nebulizers and albuterol inhaler as needed.  You have also been prescribed a course of steroids please pick those up and take as prescribed.  Please come back to the hospital if symptoms persist or worsen. Thank you for letting us treat you today. Please follow up with your PCP in the next several days and provide them with your records from this visit. Return to the Emergency Room if pain becomes severe or symptoms worsen.

## 2023-01-31 NOTE — ED Notes (Signed)
Pt placed on oxygen 3lpm to keep sats at 90%. Oxygen was turned off by an unknown source and O2 sats dropped again to 85% pt placed back on 2lpm via [place]

## 2023-01-31 NOTE — Telephone Encounter (Signed)
Call from pt who stated he was told by ER doctor to schedule a f/u visit with his doctor. No available appts at this time until 10/31. Pt stated he has a reaction from the abx prescribed by the ER doctor and has a hx of COPD. Front office scheduled pt an appt for 10/31 (first available) with his PCP. Pt instructed to go back to the ER if has breathing or any other symptoms worsen - stated he understands.

## 2023-01-31 NOTE — ED Provider Notes (Signed)
[place] EMERGENCY DEPARTMENT AT [facility] Provider Note   CSN: [id number] Arrival date & time: 01/31/23  1757     History  Chief Complaint  Patient presents with   Shortness of Breath   COPD exacerbation    Francisco [name] is a 62 y.o. male past medical history significant for COPD, CHF, MI presents today for shortness of breath.  Patient was brought in by ambulance from home and was satting at 88% on room air.  Patient took nitroglycerin before EMS arrived.  Patient denies chest pain, swelling, orthopnea, nausea, vomiting, diarrhea.  Patient endorses shortness of breath, congestion, tactile fever.  No oxygen at home.   Shortness of Breath Associated symptoms: fever        Home Medications Prior to Admission medications   Medication Sig Start Date End Date Taking? Authorizing Provider  predniSONE (DELTASONE) 50 MG tablet Take 1 tablet (50 mg total) by mouth daily with breakfast for 5 doses. 01/31/23 02/05/23 Yes Dolphus Jenny, PA-C  acetaminophen (TYLENOL) 325 MG tablet Take 650 mg by mouth every 6 (six) hours as needed for moderate pain or headache.    [provider]  albuterol (PROVENTIL) (2.5 MG/3ML) 0.083% nebulizer solution Use 1 vial (2.5 mg total) by nebulization every 6 (six) hours as needed for wheezing or shortness of breath. 11/04/22   Nooruddin, Jason Fila, MD  albuterol (VENTOLIN HFA) 108 (90 Base) MCG/ACT inhaler Inhale 2 puffs into the lungs every 4 (four) hours as needed for wheezing or shortness of breath. 12/25/22   Nooruddin, Jason Fila, MD  amLODipine (NORVASC) 5 MG tablet Take 1 tablet (5 mg total) by mouth daily. 11/29/22   Nooruddin, Jason Fila, MD  aspirin EC 81 MG tablet Take 81 mg by mouth daily. Swallow whole.    [provider]  atorvastatin (LIPITOR) 40 MG tablet Take 1 tablet (40 mg total) by mouth daily. 11/09/22   Nooruddin, Jason Fila, MD  doxycycline (VIBRAMYCIN) 100 MG capsule Take 1 capsule (100 mg total) by mouth 2 (two) times daily for 7  days. 01/28/23 02/04/23  Lonell Grandchild, MD  Fluticasone-Umeclidin-Vilant (TRELEGY ELLIPTA) 100-62.5-25 MCG/ACT AEPB Please use 1 puff daily. 09/27/22   Nooruddin, Jason Fila, MD  furosemide (LASIX) 40 MG tablet Take 1 tablet (40 mg total) by mouth daily. 05/05/22   Adron Bene, MD  lisinopril (ZESTRIL) 40 MG tablet Take 1/2 tablet (20 mg total) by mouth daily. 08/25/22   Quincy Simmonds, MD  metoprolol succinate (TOPROL-XL) 25 MG 24 hr tablet Take 1 tablet (25 mg total) by mouth in the morning. 11/29/22   Nooruddin, Jason Fila, MD  nicotine (NICODERM CQ - DOSED IN MG/24 HOURS) 14 mg/24hr patch Place 1 patch (14 mg total) onto the skin daily. Patient not taking: Reported on 08/13/2021 06/24/21   Adron Bene, MD  nitroGLYCERIN (NITROSTAT) 0.4 MG SL tablet Place 1 tablet (0.4 mg total) under the tongue every 5 (five) minutes x 3 doses as needed for chest pain. 10/14/22   Nooruddin, Jason Fila, MD      Allergies    Ivp dye [iodinated contrast media] and Tramadol    Review of Systems   Review of Systems  Constitutional:  Positive for fever.  HENT:  Positive for congestion.   Respiratory:  Positive for shortness of breath.     Physical Exam Updated Vital Signs BP (!) 176/104   Pulse (!) 111   Temp 98.3 F (36.8 C) (Axillary)   Resp (!) 24   SpO2 (!) 89%  Physical Exam Vitals  and nursing note reviewed.  Constitutional:      General: He is in acute distress.     Appearance: He is well-developed.     Comments: Patient unable to speak in full sentences.  Patient states he is beginning to feel better after Solu-Medrol IV.  HENT:     Head: Normocephalic and atraumatic.     Mouth/Throat:     Mouth: Mucous membranes are moist.  Eyes:     Conjunctiva/sclera: Conjunctivae normal.     Pupils: Pupils are equal, round, and reactive to light.  Cardiovascular:     Rate and Rhythm: Regular rhythm. Tachycardia present.     Heart sounds: No murmur heard. Pulmonary:     Effort: Pulmonary effort is normal.  Tachypnea present. No respiratory distress.     Breath sounds: Examination of the right-upper field reveals wheezing. Examination of the left-upper field reveals wheezing. Examination of the right-middle field reveals wheezing. Examination of the left-middle field reveals wheezing. Examination of the right-lower field reveals decreased breath sounds. Examination of the left-lower field reveals decreased breath sounds. Decreased breath sounds and wheezing present.  Abdominal:     Palpations: Abdomen is soft.     Tenderness: There is no abdominal tenderness.  Musculoskeletal:        General: No swelling.     Cervical back: Neck supple.     Right lower leg: No edema.     Left lower leg: No edema.  Skin:    General: Skin is warm and dry.     Capillary Refill: Capillary refill takes less than 2 seconds.  Neurological:     General: No focal deficit present.     Mental Status: He is alert.  Psychiatric:        Mood and Affect: Mood normal.     ED Results / Procedures / Treatments   Labs (all labs ordered are listed, but only abnormal results are displayed) Labs Reviewed  COMPREHENSIVE METABOLIC PANEL - Abnormal; Notable for the following components:      Result Value   Glucose, Bld 111 (*)    All other components within normal limits  CBC WITH DIFFERENTIAL/PLATELET - Abnormal; Notable for the following components:   WBC 11.1 (*)    Neutro Abs 8.0 (*)    Monocytes Absolute 1.4 (*)    All other components within normal limits  RESP PANEL BY RT-PCR (RSV, FLU A&B, COVID)  [id number]  BRAIN NATRIURETIC PEPTIDE  TROPONIN I (HIGH SENSITIVITY)  TROPONIN I (HIGH SENSITIVITY)    EKG None  Radiology No results found.  Procedures Procedures    Medications Ordered in ED Medications  magnesium sulfate IVPB 2 g 50 mL (2 g Intravenous New Bag/Given 01/31/23 2012)  ipratropium-albuterol (DUONEB) 0.5-2.5 (3) MG/3ML nebulizer solution 9 mL (9 mLs Nebulization Given 01/31/23 1842)   ipratropium-albuterol (DUONEB) 0.5-2.5 (3) MG/3ML nebulizer solution (9 mLs  Given 01/31/23 1900)    ED Course/ Medical Decision Making/ A&P                                 Medical Decision Making Amount and/or Complexity of Data Reviewed Labs: ordered. Radiology: ordered.  Risk Prescription drug management.   This patient presents to the ED with chief complaint(s) of shortness of breath with pertinent past medical history of COPD, CHF, MI which further complicates the presenting complaint. The complaint involves an extensive differential diagnosis and also carries with it a high  risk of complications and morbidity.    The differential diagnosis includes COPD exacerbation, CHF exacerbation, MI  Additional history obtained: Records reviewed previous internal med progress notes  ED Course and Reassessment: Patient given DuoNeb, 10 albuterol, 0.5 Atrovent, 125 Solu-Medrol. Patient on DuoNeb x3 Patient given 2 mg IV mag  Patient desatting to 84% on room air.  Discussed with patient admission and patient said that he would like to go home.  Patient states that he has oxygen at home.  We discussed using nebulizer and albuterol inhaler.  Also prescribed burst of steroids for patient.  Discussed with patient coming back to be admitted if symptoms persist or worsen.  Patient expressed understanding.  Patient requested to go home before all results could be gathered.  Independent labs interpretation:  The following labs were independently interpreted:  CBC: Mildly leukocytosis at 11.1 CMP: No notable findings BNP: 55.9 Troponin: 12, EKG: Sinus tach Respiratory panel: Negative  Independent visualization of imaging: - I independently visualized the following imaging with scope of interpretation limited to determining acute life threatening conditions related to emergency care: Chest x-ray, which revealed pending  Consultation: - Consulted or discussed management/test interpretation  w/ external professional: None  Consideration for admission or further workup: Considered for admission however patient expressed desire to be discharged.  Patient feels comfortable using nebulizers, albuterol, and burst dose steroids outpatient.  Patient agrees to return back to ER if symptoms persist or worsen.        Final Clinical Impression(s) / ED Diagnoses Final diagnoses:  COPD exacerbation (HCC)    Rx / DC Orders ED Discharge Orders          Ordered    predniSONE (DELTASONE) 50 MG tablet  Daily with breakfast        01/31/23 2058              Dolphus Jenny, PA-C 01/31/23 2102    Glyn Ade, MD 01/31/23 2126

## 2023-02-01 ENCOUNTER — Encounter (HOSPITAL_COMMUNITY): Payer: Self-pay | Admitting: Emergency Medicine

## 2023-02-01 ENCOUNTER — Other Ambulatory Visit: Payer: Self-pay

## 2023-02-01 ENCOUNTER — Inpatient Hospital Stay (HOSPITAL_COMMUNITY)
Admission: EM | Admit: 2023-02-01 | Discharge: 2023-02-11 | DRG: 189 | Disposition: A | Discharge status: Other Acute Inpatient Hospital | Payer: No Typology Code available for payment source | Attending: Internal Medicine | Admitting: Internal Medicine

## 2023-02-01 ENCOUNTER — Emergency Department (HOSPITAL_COMMUNITY): Payer: No Typology Code available for payment source

## 2023-02-01 DIAGNOSIS — J9622 Acute and chronic respiratory failure with hypercapnia: Secondary | ICD-10-CM | POA: Diagnosis present

## 2023-02-01 DIAGNOSIS — F319 Bipolar disorder, unspecified: Secondary | ICD-10-CM | POA: Diagnosis present

## 2023-02-01 DIAGNOSIS — J9621 Acute and chronic respiratory failure with hypoxia: Secondary | ICD-10-CM | POA: Diagnosis not present

## 2023-02-01 DIAGNOSIS — R54 Age-related physical debility: Secondary | ICD-10-CM | POA: Diagnosis present

## 2023-02-01 DIAGNOSIS — K219 Gastro-esophageal reflux disease without esophagitis: Secondary | ICD-10-CM | POA: Diagnosis present

## 2023-02-01 DIAGNOSIS — F411 Generalized anxiety disorder: Secondary | ICD-10-CM | POA: Diagnosis present

## 2023-02-01 DIAGNOSIS — Z79899 Other long term (current) drug therapy: Secondary | ICD-10-CM

## 2023-02-01 DIAGNOSIS — M545 Low back pain, unspecified: Secondary | ICD-10-CM | POA: Diagnosis present

## 2023-02-01 DIAGNOSIS — Z8261 Family history of arthritis: Secondary | ICD-10-CM

## 2023-02-01 DIAGNOSIS — Z885 Allergy status to narcotic agent status: Secondary | ICD-10-CM

## 2023-02-01 DIAGNOSIS — Z7951 Long term (current) use of inhaled steroids: Secondary | ICD-10-CM

## 2023-02-01 DIAGNOSIS — J441 Chronic obstructive pulmonary disease with (acute) exacerbation: Secondary | ICD-10-CM | POA: Diagnosis present

## 2023-02-01 DIAGNOSIS — F05 Delirium due to known physiological condition: Secondary | ICD-10-CM | POA: Diagnosis not present

## 2023-02-01 DIAGNOSIS — J9601 Acute respiratory failure with hypoxia: Principal | ICD-10-CM | POA: Diagnosis present

## 2023-02-01 DIAGNOSIS — R609 Edema, unspecified: Secondary | ICD-10-CM | POA: Diagnosis not present

## 2023-02-01 DIAGNOSIS — F17210 Nicotine dependence, cigarettes, uncomplicated: Secondary | ICD-10-CM | POA: Diagnosis present

## 2023-02-01 DIAGNOSIS — Z881 Allergy status to other antibiotic agents status: Secondary | ICD-10-CM

## 2023-02-01 DIAGNOSIS — G928 Other toxic encephalopathy: Secondary | ICD-10-CM | POA: Diagnosis not present

## 2023-02-01 DIAGNOSIS — Z955 Presence of coronary angioplasty implant and graft: Secondary | ICD-10-CM

## 2023-02-01 DIAGNOSIS — R0902 Hypoxemia: Secondary | ICD-10-CM

## 2023-02-01 DIAGNOSIS — I272 Pulmonary hypertension, unspecified: Secondary | ICD-10-CM | POA: Diagnosis not present

## 2023-02-01 DIAGNOSIS — J9602 Acute respiratory failure with hypercapnia: Secondary | ICD-10-CM | POA: Diagnosis not present

## 2023-02-01 DIAGNOSIS — E785 Hyperlipidemia, unspecified: Secondary | ICD-10-CM | POA: Diagnosis present

## 2023-02-01 DIAGNOSIS — R000 Tachycardia, unspecified: Secondary | ICD-10-CM | POA: Diagnosis present

## 2023-02-01 DIAGNOSIS — F410 Panic disorder [episodic paroxysmal anxiety] without agoraphobia: Secondary | ICD-10-CM | POA: Diagnosis not present

## 2023-02-01 DIAGNOSIS — I161 Hypertensive emergency: Secondary | ICD-10-CM | POA: Diagnosis present

## 2023-02-01 DIAGNOSIS — I5022 Chronic systolic (congestive) heart failure: Secondary | ICD-10-CM | POA: Diagnosis not present

## 2023-02-01 DIAGNOSIS — G9341 Metabolic encephalopathy: Secondary | ICD-10-CM | POA: Diagnosis not present

## 2023-02-01 DIAGNOSIS — T380X5A Adverse effect of glucocorticoids and synthetic analogues, initial encounter: Secondary | ICD-10-CM | POA: Diagnosis not present

## 2023-02-01 DIAGNOSIS — I252 Old myocardial infarction: Secondary | ICD-10-CM

## 2023-02-01 DIAGNOSIS — R32 Unspecified urinary incontinence: Secondary | ICD-10-CM | POA: Diagnosis present

## 2023-02-01 DIAGNOSIS — I2510 Atherosclerotic heart disease of native coronary artery without angina pectoris: Secondary | ICD-10-CM | POA: Diagnosis present

## 2023-02-01 DIAGNOSIS — Z7982 Long term (current) use of aspirin: Secondary | ICD-10-CM

## 2023-02-01 DIAGNOSIS — I959 Hypotension, unspecified: Secondary | ICD-10-CM | POA: Diagnosis not present

## 2023-02-01 DIAGNOSIS — I5042 Chronic combined systolic (congestive) and diastolic (congestive) heart failure: Secondary | ICD-10-CM | POA: Diagnosis present

## 2023-02-01 DIAGNOSIS — I2723 Pulmonary hypertension due to lung diseases and hypoxia: Secondary | ICD-10-CM | POA: Diagnosis present

## 2023-02-01 DIAGNOSIS — I5082 Biventricular heart failure: Secondary | ICD-10-CM | POA: Diagnosis present

## 2023-02-01 DIAGNOSIS — I5040 Unspecified combined systolic (congestive) and diastolic (congestive) heart failure: Secondary | ICD-10-CM | POA: Diagnosis present

## 2023-02-01 DIAGNOSIS — J9690 Respiratory failure, unspecified, unspecified whether with hypoxia or hypercapnia: Secondary | ICD-10-CM | POA: Diagnosis present

## 2023-02-01 DIAGNOSIS — D72829 Elevated white blood cell count, unspecified: Secondary | ICD-10-CM | POA: Diagnosis present

## 2023-02-01 DIAGNOSIS — I110 Hypertensive heart disease with heart failure: Secondary | ICD-10-CM | POA: Diagnosis present

## 2023-02-01 DIAGNOSIS — Z1152 Encounter for screening for COVID-19: Secondary | ICD-10-CM | POA: Diagnosis not present

## 2023-02-01 DIAGNOSIS — E873 Alkalosis: Secondary | ICD-10-CM | POA: Diagnosis not present

## 2023-02-01 DIAGNOSIS — F1590 Other stimulant use, unspecified, uncomplicated: Secondary | ICD-10-CM | POA: Diagnosis present

## 2023-02-01 DIAGNOSIS — Z8619 Personal history of other infectious and parasitic diseases: Secondary | ICD-10-CM

## 2023-02-01 DIAGNOSIS — R0602 Shortness of breath: Secondary | ICD-10-CM | POA: Diagnosis present

## 2023-02-01 DIAGNOSIS — N179 Acute kidney failure, unspecified: Secondary | ICD-10-CM | POA: Diagnosis not present

## 2023-02-01 DIAGNOSIS — Z8249 Family history of ischemic heart disease and other diseases of the circulatory system: Secondary | ICD-10-CM

## 2023-02-01 DIAGNOSIS — J449 Chronic obstructive pulmonary disease, unspecified: Secondary | ICD-10-CM | POA: Diagnosis present

## 2023-02-01 DIAGNOSIS — I25118 Atherosclerotic heart disease of native coronary artery with other forms of angina pectoris: Secondary | ICD-10-CM | POA: Diagnosis not present

## 2023-02-01 DIAGNOSIS — Z91041 Radiographic dye allergy status: Secondary | ICD-10-CM

## 2023-02-01 LAB — CBC
HCT: 50.5 % (ref 39.0–52.0)
HCT: 49.7 % (ref 39.0–52.0)
Hemoglobin: 16.2 g/dL (ref 13.0–17.0)
Hemoglobin: 15.8 g/dL (ref 13.0–17.0)
MCH: 31.6 pg (ref 26.0–34.0)
MCH: 30.8 pg (ref 26.0–34.0)
MCHC: 32.1 g/dL (ref 30.0–36.0)
MCHC: 31.8 g/dL (ref 30.0–36.0)
MCV: 98.6 fL (ref 80.0–100.0)
MCV: 96.9 fL (ref 80.0–100.0)
Platelets: 269 10*3/uL (ref 150–400)
Platelets: 242 10*3/uL (ref 150–400)
RBC: 5.12 MIL/uL (ref 4.22–5.81)
RBC: 5.13 MIL/uL (ref 4.22–5.81)
RDW: 13.6 % (ref 11.5–15.5)
RDW: 13.6 % (ref 11.5–15.5)
WBC: 12.9 10*3/uL — ABNORMAL HIGH (ref 4.0–10.5)
WBC: 12.2 10*3/uL — ABNORMAL HIGH (ref 4.0–10.5)
nRBC: 0 % (ref 0.0–0.2)
nRBC: 0 % (ref 0.0–0.2)

## 2023-02-01 LAB — BASIC METABOLIC PANEL
Anion gap: 11 (ref 5–15)
BUN: 16 mg/dL (ref 8–23)
CO2: 33 mmol/L — ABNORMAL HIGH (ref 22–32)
Calcium: 9.3 mg/dL (ref 8.9–10.3)
Chloride: 100 mmol/L (ref 98–111)
Creatinine, Ser: 0.99 mg/dL (ref 0.61–1.24)
GFR, Estimated: >60 mL/min (ref >60)
Glucose, Bld: 110 mg/dL — ABNORMAL HIGH (ref 70–99)
Potassium: 3.9 mmol/L (ref 3.5–5.1)
Sodium: 144 mmol/L (ref 135–145)

## 2023-02-01 LAB — I-STAT VENOUS BLOOD GAS, ED
Acid-Base Excess: 6 mmol/L — ABNORMAL HIGH (ref 0.0–2.0)
Bicarbonate: 33.1 mmol/L — ABNORMAL HIGH (ref 20.0–28.0)
Calcium, Ion: 1.05 mmol/L — ABNORMAL LOW (ref 1.15–1.40)
HCT: 50 % (ref 39.0–52.0)
Hemoglobin: 17 g/dL (ref 13.0–17.0)
O2 Saturation: 99 %
Potassium: 3.6 mmol/L (ref 3.5–5.1)
Sodium: 142 mmol/L (ref 135–145)
TCO2: 35 mmol/L — ABNORMAL HIGH (ref 22–32)
pCO2, Ven: 54.4 mm[Hg] (ref 44–60)
pH, Ven: 7.393 (ref 7.25–7.43)
pO2, Ven: 124 mm[Hg] — ABNORMAL HIGH (ref 32–45)

## 2023-02-01 LAB — HIV ANTIBODY (ROUTINE TESTING W REFLEX): HIV Screen 4th Generation wRfx: NONREACTIVE

## 2023-02-01 LAB — MRSA NEXT GEN BY PCR, NASAL: MRSA by PCR Next Gen: DETECTED — AB

## 2023-02-01 LAB — TROPONIN I (HIGH SENSITIVITY)
Troponin I (High Sensitivity): 17 ng/L (ref <18)
Troponin I (High Sensitivity): 19 ng/L — ABNORMAL HIGH (ref <18)

## 2023-02-01 LAB — GLUCOSE, CAPILLARY
Glucose-Capillary: 135 mg/dL — ABNORMAL HIGH (ref 70–99)
Glucose-Capillary: 139 mg/dL — ABNORMAL HIGH (ref 70–99)
Glucose-Capillary: 150 mg/dL — ABNORMAL HIGH (ref 70–99)

## 2023-02-01 MED ORDER — ALBUTEROL SULFATE (2.5 MG/3ML) 0.083% IN NEBU
10.0000 mg | INHALATION_SOLUTION | Freq: Once | RESPIRATORY_TRACT | Status: AC
  Administered 2023-02-01: 10 mg via RESPIRATORY_TRACT
  Filled 2023-02-01: qty 12

## 2023-02-01 MED ORDER — CHLORHEXIDINE GLUCONATE CLOTH 2 % EX PADS
6.0000 | MEDICATED_PAD | Freq: Every day | CUTANEOUS | Status: DC
  Administered 2023-02-01 – 2023-02-10 (×10): 6 via TOPICAL

## 2023-02-01 MED ORDER — NITROGLYCERIN 0.4 MG SL SUBL
0.4000 mg | SUBLINGUAL_TABLET | Freq: Once | SUBLINGUAL | Status: AC
  Administered 2023-02-01: 0.4 mg via SUBLINGUAL
  Filled 2023-02-01: qty 1

## 2023-02-01 MED ORDER — METOPROLOL SUCCINATE ER 25 MG PO TB24
25.0000 mg | ORAL_TABLET | Freq: Every morning | ORAL | Status: DC
  Administered 2023-02-02 – 2023-02-03 (×2): 25 mg via ORAL
  Filled 2023-02-01 (×2): qty 1

## 2023-02-01 MED ORDER — ARFORMOTEROL TARTRATE 15 MCG/2ML IN NEBU
15.0000 ug | INHALATION_SOLUTION | Freq: Two times a day (BID) | RESPIRATORY_TRACT | Status: DC
  Administered 2023-02-01 – 2023-02-11 (×19): 15 ug via RESPIRATORY_TRACT
  Filled 2023-02-01 (×22): qty 2

## 2023-02-01 MED ORDER — MORPHINE SULFATE (PF) 2 MG/ML IV SOLN
INTRAVENOUS | Status: AC
  Filled 2023-02-01: qty 1

## 2023-02-01 MED ORDER — ASPIRIN 81 MG PO TBEC
81.0000 mg | DELAYED_RELEASE_TABLET | Freq: Every day | ORAL | Status: DC
  Administered 2023-02-01 – 2023-02-11 (×10): 81 mg via ORAL
  Filled 2023-02-01 (×10): qty 1

## 2023-02-01 MED ORDER — ACETAMINOPHEN 325 MG PO TABS
650.0000 mg | ORAL_TABLET | Freq: Four times a day (QID) | ORAL | Status: DC | PRN
  Administered 2023-02-01: 650 mg via ORAL
  Filled 2023-02-01: qty 2

## 2023-02-01 MED ORDER — METHYLPREDNISOLONE SODIUM SUCC 40 MG IJ SOLR
40.0000 mg | Freq: Every day | INTRAMUSCULAR | Status: AC
  Administered 2023-02-02 – 2023-02-03 (×2): 40 mg via INTRAVENOUS
  Filled 2023-02-01 (×3): qty 1

## 2023-02-01 MED ORDER — REVEFENACIN 175 MCG/3ML IN SOLN
175.0000 ug | Freq: Every day | RESPIRATORY_TRACT | Status: DC
  Administered 2023-02-02 – 2023-02-11 (×10): 175 ug via RESPIRATORY_TRACT
  Filled 2023-02-01 (×10): qty 3

## 2023-02-01 MED ORDER — NICOTINE 21 MG/24HR TD PT24
21.0000 mg | MEDICATED_PATCH | Freq: Every day | TRANSDERMAL | Status: DC
  Administered 2023-02-01 – 2023-02-11 (×11): 21 mg via TRANSDERMAL
  Filled 2023-02-01 (×11): qty 1

## 2023-02-01 MED ORDER — HEPARIN SODIUM (PORCINE) 5000 UNIT/ML IJ SOLN
5000.0000 [IU] | Freq: Three times a day (TID) | INTRAMUSCULAR | Status: DC
  Administered 2023-02-01 – 2023-02-02 (×3): 5000 [IU] via SUBCUTANEOUS
  Filled 2023-02-01 (×3): qty 1

## 2023-02-01 MED ORDER — POLYETHYLENE GLYCOL 3350 17 G PO PACK: 17.0000 g | PACK | Freq: Every day | ORAL | Status: DC | PRN

## 2023-02-01 MED ORDER — ALBUTEROL SULFATE (2.5 MG/3ML) 0.083% IN NEBU
2.5000 mg | INHALATION_SOLUTION | Freq: Four times a day (QID) | RESPIRATORY_TRACT | Status: DC | PRN
  Administered 2023-02-01 – 2023-02-04 (×7): 2.5 mg via RESPIRATORY_TRACT
  Filled 2023-02-01 (×8): qty 3

## 2023-02-01 MED ORDER — DEXMEDETOMIDINE HCL IN NACL 400 MCG/100ML IV SOLN: 0.0000 ug/kg/h | INTRAVENOUS | Status: DC

## 2023-02-01 MED ORDER — MORPHINE SULFATE (PF) 2 MG/ML IV SOLN
1.0000 mg | INTRAVENOUS | Status: AC | PRN
  Administered 2023-02-01 – 2023-02-02 (×3): 1 mg via INTRAVENOUS
  Filled 2023-02-01 (×2): qty 1

## 2023-02-01 MED ORDER — IPRATROPIUM-ALBUTEROL 0.5-2.5 (3) MG/3ML IN SOLN
RESPIRATORY_TRACT | Status: AC
  Filled 2023-02-01: qty 3

## 2023-02-01 MED ORDER — LORAZEPAM 2 MG/ML IJ SOLN
0.5000 mg | Freq: Once | INTRAMUSCULAR | Status: AC
  Administered 2023-02-01: 0.5 mg via INTRAVENOUS
  Filled 2023-02-01: qty 1

## 2023-02-01 MED ORDER — ATORVASTATIN CALCIUM 40 MG PO TABS
40.0000 mg | ORAL_TABLET | Freq: Every day | ORAL | Status: DC
  Administered 2023-02-01 – 2023-02-11 (×10): 40 mg via ORAL
  Filled 2023-02-01 (×10): qty 1

## 2023-02-01 MED ORDER — BUDESONIDE 0.25 MG/2ML IN SUSP
0.2500 mg | Freq: Two times a day (BID) | RESPIRATORY_TRACT | Status: DC
  Administered 2023-02-01 – 2023-02-05 (×8): 0.25 mg via RESPIRATORY_TRACT
  Filled 2023-02-01 (×8): qty 2

## 2023-02-01 MED ORDER — LISINOPRIL 20 MG PO TABS
20.0000 mg | ORAL_TABLET | Freq: Every day | ORAL | Status: DC
  Administered 2023-02-01 – 2023-02-05 (×5): 20 mg via ORAL
  Filled 2023-02-01 (×2): qty 1
  Filled 2023-02-01 (×2): qty 2
  Filled 2023-02-01: qty 1

## 2023-02-01 MED ORDER — DOCUSATE SODIUM 100 MG PO CAPS
100.0000 mg | ORAL_CAPSULE | Freq: Two times a day (BID) | ORAL | Status: DC | PRN
  Administered 2023-02-05: 100 mg via ORAL
  Filled 2023-02-01: qty 1

## 2023-02-01 MED ORDER — AMLODIPINE BESYLATE 5 MG PO TABS
5.0000 mg | ORAL_TABLET | Freq: Every day | ORAL | Status: DC
  Administered 2023-02-01 – 2023-02-02 (×2): 5 mg via ORAL
  Filled 2023-02-01 (×3): qty 1

## 2023-02-01 MED ORDER — IPRATROPIUM-ALBUTEROL 0.5-2.5 (3) MG/3ML IN SOLN
3.0000 mL | Freq: Once | RESPIRATORY_TRACT | Status: AC
  Administered 2023-02-01: 3 mL via RESPIRATORY_TRACT

## 2023-02-01 NOTE — Progress Notes (Signed)
Pt still complaining 8/10 back pain despite PRN Tylenol administration. Pt requested to sit in chair to help with back pain and anxiety. Pt able to easily stand and reposition in to chair. Pt reports feeling very anxious. Noted to have tremors. 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  Reached out to [facility] with concerns about continued pain and possible need to start Precedex to help pt tolerate wearing Bipap.

## 2023-02-01 NOTE — ED Provider Notes (Signed)
[place] EMERGENCY DEPARTMENT AT [facility] Provider Note   CSN: [id number] Arrival date & time: 02/01/23  1333     History  Chief Complaint  Patient presents with   Shortness of Breath    Francisco [name] is a 62 y.o. male.  62 year old male with past medical history of COPD and CHF presenting to the emergency department today with shortness of breath.  The patient was evaluated yesterday at [facility] emergency department.  He had a relatively favorable workup but it was recommended that he be admitted for increased work of breathing.  The patient signed out AGAINST MEDICAL ADVICE.  He reports that his breathing is gotten worse overnight.  He came to the ER today for further evaluation due to worsening shortness of breath.  The patient received Solu-Medrol with medics.  He also received DuoNebs.  On arrival the patient is speaking in 1-2 word sentences.   Shortness of Breath Associated symptoms: cough and wheezing   Associated symptoms: no chest pain        Home Medications Prior to Admission medications   Medication Sig Start Date End Date Taking? Authorizing Provider  acetaminophen (TYLENOL) 325 MG tablet Take 650 mg by mouth every 6 (six) hours as needed for moderate pain or headache.    [provider]  albuterol (PROVENTIL) (2.5 MG/3ML) 0.083% nebulizer solution Use 1 vial (2.5 mg total) by nebulization every 6 (six) hours as needed for wheezing or shortness of breath. 11/04/22   Nooruddin, Jason Fila, MD  albuterol (VENTOLIN HFA) 108 (90 Base) MCG/ACT inhaler Inhale 2 puffs into the lungs every 4 (four) hours as needed for wheezing or shortness of breath. 12/25/22   Nooruddin, Jason Fila, MD  amLODipine (NORVASC) 5 MG tablet Take 1 tablet (5 mg total) by mouth daily. 11/29/22   Nooruddin, Jason Fila, MD  aspirin EC 81 MG tablet Take 81 mg by mouth daily. Swallow whole.    [provider]  atorvastatin (LIPITOR) 40 MG tablet Take 1 tablet (40 mg total) by mouth daily.  11/09/22   Nooruddin, Jason Fila, MD  doxycycline (VIBRAMYCIN) 100 MG capsule Take 1 capsule (100 mg total) by mouth 2 (two) times daily for 7 days. 01/28/23 02/04/23  Lonell Grandchild, MD  Fluticasone-Umeclidin-Vilant (TRELEGY ELLIPTA) 100-62.5-25 MCG/ACT AEPB Please use 1 puff daily. 09/27/22   Nooruddin, Jason Fila, MD  furosemide (LASIX) 40 MG tablet Take 1 tablet (40 mg total) by mouth daily. 05/05/22   Adron Bene, MD  lisinopril (ZESTRIL) 40 MG tablet Take 1/2 tablet (20 mg total) by mouth daily. 08/25/22   Quincy Simmonds, MD  metoprolol succinate (TOPROL-XL) 25 MG 24 hr tablet Take 1 tablet (25 mg total) by mouth in the morning. 11/29/22   Nooruddin, Jason Fila, MD  nicotine (NICODERM CQ - DOSED IN MG/24 HOURS) 14 mg/24hr patch Place 1 patch (14 mg total) onto the skin daily. Patient not taking: Reported on 08/13/2021 06/24/21   Adron Bene, MD  nitroGLYCERIN (NITROSTAT) 0.4 MG SL tablet Place 1 tablet (0.4 mg total) under the tongue every 5 (five) minutes x 3 doses as needed for chest pain. 10/14/22   Nooruddin, Jason Fila, MD  predniSONE (DELTASONE) 50 MG tablet Take 1 tablet (50 mg total) by mouth daily with breakfast for 5 doses. 01/31/23 02/05/23  Dolphus Jenny, PA-C      Allergies    Ivp dye [iodinated contrast media] and Tramadol    Review of Systems   Review of Systems  Respiratory:  Positive for cough, shortness of  breath and wheezing.   Cardiovascular:  Negative for chest pain and leg swelling.  All other systems reviewed and are negative.   Physical Exam Updated Vital Signs BP (!) 165/100   Pulse (!) 116   Temp (!) 96.3 F (35.7 C) (Oral)   Resp (!) 23   Ht 6' (1.829 m)   Wt 83.9 kg   SpO2 100%   BMI 25.09 kg/m  Physical Exam Vitals and nursing note reviewed.   Gen: Tachypneic speaking in 1-2 word sentences Eyes: PERRL, EOMI HEENT: no oropharyngeal swelling Neck: trachea midline Resp: Diminished with scattered wheezes throughout Card: Tachycardic, no murmurs, rubs, or  gallops Abd: nontender, nondistended Extremities: no calf tenderness, no edema Vascular: 2+ radial pulses bilaterally, 2+ DP pulses bilaterally Skin: no rashes Psyc: acting appropriately   ED Results / Procedures / Treatments   Labs (all labs ordered are listed, but only abnormal results are displayed) Labs Reviewed  BASIC METABOLIC PANEL - Abnormal; Notable for the following components:      Result Value   CO2 33 (*)    Glucose, Bld 110 (*)    All other components within normal limits  CBC - Abnormal; Notable for the following components:   WBC 12.9 (*)    All other components within normal limits  I-STAT VENOUS BLOOD GAS, ED - Abnormal; Notable for the following components:   pO2, Ven 124 (*)    Bicarbonate 33.1 (*)    TCO2 35 (*)    Acid-Base Excess 6.0 (*)    Calcium, Ion 1.05 (*)    All other components within normal limits  TROPONIN I (HIGH SENSITIVITY)  TROPONIN I (HIGH SENSITIVITY)    EKG None  Radiology DG Chest 2 View  Result Date: 01/31/2023 CLINICAL DATA:  Wheezing and shortness of breath. EXAM: CHEST - 2 VIEW COMPARISON:  January 28, 2023 FINDINGS: The heart size and mediastinal contours are within normal limits. Both lungs are clear. The visualized skeletal structures are unremarkable. IMPRESSION: No active cardiopulmonary disease. Electronically Signed   By: Aram Candela M.D.   On: 01/31/2023 21:13    Procedures Procedures    Medications Ordered in ED Medications  dexmedetomidine (PRECEDEX) 400 MCG/100ML (4 mcg/mL) infusion (0 mcg/kg/hr  83.9 kg Intravenous Hold 02/01/23 1512)  ipratropium-albuterol (DUONEB) 0.5-2.5 (3) MG/3ML nebulizer solution 3 mL (3 mLs Nebulization Given 02/01/23 1342)  ipratropium-albuterol (DUONEB) 0.5-2.5 (3) MG/3ML nebulizer solution (  Given 02/01/23 1354)  albuterol (PROVENTIL) (2.5 MG/3ML) 0.083% nebulizer solution 10 mg (10 mg Nebulization Given 02/01/23 1354)  nitroGLYCERIN (NITROSTAT) SL tablet 0.4 mg (0.4 mg  Sublingual Given 02/01/23 1423)  LORazepam (ATIVAN) injection 0.5 mg (0.5 mg Intravenous Given 02/01/23 1432)    ED Course/ Medical Decision Making/ A&P                                 Medical Decision Making 62 year old male with past medical history of CHF and COPD presenting to the emergency department today with worsening shortness of breath after signing out AGAINST MEDICAL ADVICE last night.  The patient initially was refusing BiPAP which I recommended for his increased work of breathing.  The patient is given additional nebulizer treatments here initially.  He continued to have significant increased work of breathing.  Eventually was out of the patient on BiPAP.  He was not tolerating this well initially.  He is given a dose of Ativan with some improvement.  Initially I  did call and discussed this case with the ICU as I felt that he may require additional sedation with Precedex.  They do come down and evaluate the patient and felt that he would be stable for the stepdown unit.  His blood pressures improved here as well so does not appear that this is due to hypertensive emergency.  His chest x-ray interpreted by me shows no acute infiltrates, no pulmonary edema, no pneumothorax.  Calls placed to hospitalist service for admission.  Critical care time 42 minutes including reassessments, review of old records, coordination of care with ICU team, coronation care with hospitalist service, treatment severe respiratory distress and hypoxemic respiratory failure with BiPAP  Amount and/or Complexity of Data Reviewed Labs: ordered. Radiology: ordered.  Risk Prescription drug management. Decision regarding hospitalization.           Final Clinical Impression(s) / ED Diagnoses Final diagnoses:  COPD exacerbation (HCC)  Hypoxia    Rx / DC Orders ED Discharge Orders     None         Durwin Glaze, MD 02/01/23 [phone]

## 2023-02-01 NOTE — Progress Notes (Signed)
eLink Physician-Brief Progress Note Patient Name: Francisco [name] DOB: 1961-02-01 MRN: [id number]   Date of Service  02/01/2023  HPI/Events of Note  RN reports pt c/o 8/10 back pain.  Asking for prn pain med, can take PO  Seen sitting at the side of bed with legs dangled  eICU Interventions  Ordered Tylenol prn     Intervention Category Intermediate Interventions: Pain - evaluation and management  Darl Pikes 02/01/2023, 8:25 PM

## 2023-02-01 NOTE — ED Triage Notes (Signed)
Per [facility] pt coming from home c/o shortness of breath and COPD. Was seen at [facility] last night for same. Given 10mg  albuterol and 0.5 atrovent and 125 mg solu medrol en route. Patient tripoding on arrival. Placed on 4 L [place].

## 2023-02-01 NOTE — H&P (Signed)
NAME:  Francisco [name], MRN:  [id number], DOB:  1961/01/10, LOS: 0 ADMISSION DATE:  02/01/2023, CONSULTATION DATE:  02/01/2023 REFERRING MD:  Durwin Glaze, MD, CHIEF COMPLAINT:  respiratory failure   History of Present Illness:  Francisco [name] is a 63 y.o. man with 100 pack year smoking history who presents with COPD exacerbation. Previous 2 ppd smoker, now down to 1/2 ppd. Quit previously cold [place]. Long standing dyspnea with frequent flares requiring ED visit. Never been intubated. Breathing has worsened a lot in the last year. 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   Here with 4-5 days of shortness of breath. No cough, fevers, chills, sick contacts sputum production. No lower extremity edema or orthopnea. Just profound inability to catch his breath.   Lives at home with roommate/coworker who also smokes occasionally. Interested in quitting and wants to get consistent care for his copd.   Pertinent  Medical History  COPD HTN HLD CAD s/p PCI several years ago   Significant Hospital Events: Including procedures, antibiotic start and stop dates in addition to other pertinent events     Interim History / Subjective:    Objective   Blood pressure (!) 165/100, pulse (!) 116, temperature (!) 96.3 F (35.7 C), temperature source Oral, resp. rate (!) 23, height 6' (1.829 m), weight 83.9 kg, SpO2 100%.    FiO2 (%):  [40 %] 40 %  No intake or output data in the 24 hours ending 02/01/23 1548 Filed Weights   02/01/23 1340  Weight: 83.9 kg    Examination: General: appears older than state age, tachypnic in tripod position HENT: on bipap Lungs: diminished, minimal air entry, end expiratory wheeze Cardiovascular: tachycardic, regular, diminished Abdomen: soft, nontender Extremities: no edema Neuro: normal speech Aox4 anxious   Resolved Hospital Problem list     Assessment & Plan:  Acute hypoxemic and hypercapnic respiratory failure  secondary to  COPD with acute exacerbation Tobacco use disorder - solumedrol 40 mg daily - triple nebulized therapy - nicotine patch - on bipap at bedtime and as needed during the day for work of breathing  CAD HTN HLD Continue home meds, reordered  Best Practice (right click and "Reselect all SmartList Selections" daily)   Diet/type: NPO w/ oral meds DVT prophylaxis: prophylactic heparin  GI prophylaxis: N/A Lines: N/A Foley:  N/A Code Status:  full code Last date of multidisciplinary goals of care discussion []   Labs   CBC: Recent Labs  Lab 01/28/23 2145 01/28/23 2147 01/31/23 1846 02/01/23 1342 02/01/23 1355  WBC 7.3  --  11.1* 12.9*  --   NEUTROABS 4.9  --  8.0*  --   --   HGB 15.9 16.7 15.7 16.2 17.0  HCT 48.4 49.0 49.9 50.5 50.0  MCV 95.5  --  99.2 98.6  --   PLT 247  --  276 269  --     Basic Metabolic Panel: Recent Labs  Lab 01/28/23 2145 01/28/23 2147 01/31/23 1846 02/01/23 1342 02/01/23 1355  NA 137 136 140 144 142  K 3.5 3.4* 3.5 3.9 3.6  CL 99  --  101 100  --   CO2 26  --  30 33*  --   GLUCOSE 104*  --  111* 110*  --   BUN 13  --  17 16  --   CREATININE 0.98  --  1.02 0.99  --   CALCIUM 9.3  --  8.9 9.3  --  GFR: Estimated Creatinine Clearance: 84.9 mL/min (by C-G formula based on SCr of 0.99 mg/dL). Recent Labs  Lab 01/28/23 2145 01/31/23 1846 02/01/23 1342  WBC 7.3 11.1* 12.9*    Liver Function Tests: Recent Labs  Lab 01/28/23 2145 01/31/23 1846  AST 29 19  ALT 48* 41  ALKPHOS 59 59  BILITOT 0.7 0.5  PROT 6.6 7.0  ALBUMIN 3.6 3.9   No results for input(s): "LIPASE", "AMYLASE" in the last 168 hours. No results for input(s): "AMMONIA" in the last 168 hours.  ABG    Component Value Date/Time   PHART 7.399 08/18/2019 0350   PCO2ART 42.5 08/18/2019 0350   PO2ART 66 (L) 08/18/2019 0350   HCO3 33.1 (H) 02/01/2023 1355   TCO2 35 (H) 02/01/2023 1355   O2SAT 99 02/01/2023 1355     Coagulation Profile: No results for  input(s): "INR", "PROTIME" in the last 168 hours.  Cardiac Enzymes: No results for input(s): "CKTOTAL", "CKMB", "CKMBINDEX", "TROPONINI" in the last 168 hours.  HbA1C: Hgb A1c MFr Bld  Date/Time Value Ref Range Status  08/18/2019 08:38 AM 5.5 4.8 - 5.6 % Final    Comment:    RESULTS CONFIRMED BY MANUAL DILUTION (NOTE) Pre diabetes:          5.7%-6.4% Diabetes:              >6.4% Glycemic control for   <7.0% adults with diabetes   04/29/2019 03:03 AM 5.5 4.8 - 5.6 % Final    Comment:    (NOTE) Pre diabetes:          5.7%-6.4% Diabetes:              >6.4% Glycemic control for   <7.0% adults with diabetes     CBG: No results for input(s): "GLUCAP" in the last 168 hours.  Review of Systems:   Shortness of breath, no fevers, chills, night sweats, hemoptysis or weight loss.   Past Medical History:  He,  has a past medical history of CHF (congestive heart failure) (HCC), COPD (chronic obstructive pulmonary disease) (HCC), COPD with acute exacerbation (HCC) (10/21/2021), Coronary artery disease, Hepatitis-C, Hypertension, IV drug abuse (HCC), Medically noncompliant, MI (myocardial infarction) (HCC), and Tobacco abuse.   Surgical History:   Past Surgical History:  Procedure Laterality Date   BACK SURGERY     NECK SURGERY     RIGHT/LEFT HEART CATH AND CORONARY ANGIOGRAPHY N/A 04/30/2019   Procedure: RIGHT/LEFT HEART CATH AND CORONARY ANGIOGRAPHY;  Surgeon: Marykay Lex, MD;  Location: [facility] INVASIVE CV LAB;  Service: Cardiovascular;  Laterality: N/A;   stints       Social History:   reports that he has been smoking cigarettes. He has never used smokeless tobacco. He reports current drug use. Drug: Methamphetamines. He reports that he does not drink alcohol.   Family History:  His family history includes Heart disease in his paternal grandfather; Rheum arthritis in his mother.   Allergies Allergies  Allergen Reactions   Ivp Dye [Iodinated Contrast Media] Other (See  Comments)    Seizures    Tramadol Other (See Comments)    Seizures      Home Medications  Prior to Admission medications   Medication Sig Start Date End Date Taking? Authorizing Provider  acetaminophen (TYLENOL) 325 MG tablet Take 650 mg by mouth every 6 (six) hours as needed for moderate pain or headache.    [provider]  albuterol (PROVENTIL) (2.5 MG/3ML) 0.083% nebulizer solution Use 1 vial (2.5 mg  total) by nebulization every 6 (six) hours as needed for wheezing or shortness of breath. 11/04/22   Nooruddin, Jason Fila, MD  albuterol (VENTOLIN HFA) 108 (90 Base) MCG/ACT inhaler Inhale 2 puffs into the lungs every 4 (four) hours as needed for wheezing or shortness of breath. 12/25/22   Nooruddin, Jason Fila, MD  amLODipine (NORVASC) 5 MG tablet Take 1 tablet (5 mg total) by mouth daily. 11/29/22   Nooruddin, Jason Fila, MD  aspirin EC 81 MG tablet Take 81 mg by mouth daily. Swallow whole.    [provider]  atorvastatin (LIPITOR) 40 MG tablet Take 1 tablet (40 mg total) by mouth daily. 11/09/22   Nooruddin, Jason Fila, MD  doxycycline (VIBRAMYCIN) 100 MG capsule Take 1 capsule (100 mg total) by mouth 2 (two) times daily for 7 days. 01/28/23 02/04/23  Lonell Grandchild, MD  Fluticasone-Umeclidin-Vilant (TRELEGY ELLIPTA) 100-62.5-25 MCG/ACT AEPB Please use 1 puff daily. 09/27/22   Nooruddin, Jason Fila, MD  furosemide (LASIX) 40 MG tablet Take 1 tablet (40 mg total) by mouth daily. 05/05/22   Adron Bene, MD  lisinopril (ZESTRIL) 40 MG tablet Take 1/2 tablet (20 mg total) by mouth daily. 08/25/22   Quincy Simmonds, MD  metoprolol succinate (TOPROL-XL) 25 MG 24 hr tablet Take 1 tablet (25 mg total) by mouth in the morning. 11/29/22   Nooruddin, Jason Fila, MD  nicotine (NICODERM CQ - DOSED IN MG/24 HOURS) 14 mg/24hr patch Place 1 patch (14 mg total) onto the skin daily. Patient not taking: Reported on 08/13/2021 06/24/21   Adron Bene, MD  nitroGLYCERIN (NITROSTAT) 0.4 MG SL tablet Place 1 tablet (0.4 mg  total) under the tongue every 5 (five) minutes x 3 doses as needed for chest pain. 10/14/22   Nooruddin, Jason Fila, MD  predniSONE (DELTASONE) 50 MG tablet Take 1 tablet (50 mg total) by mouth daily with breakfast for 5 doses. 01/31/23 02/05/23  Dolphus Jenny, PA-C     Critical care time:     The patient is critically ill due to respiratory failure.  Critical care was necessary to treat or prevent imminent or life-threatening deterioration.  Critical care was time spent personally by me on the following activities: development of treatment plan with patient and/or surrogate as well as nursing, discussions with consultants, evaluation of patient's response to treatment, examination of patient, obtaining history from patient or surrogate, ordering and performing treatments and interventions, ordering and review of laboratory studies, ordering and review of radiographic studies, pulse oximetry, re-evaluation of patient's condition and participation in multidisciplinary rounds.   Critical Care Time devoted to patient care services described in this note is 45 minutes. This time reflects time of care of this signee Charlott Holler . This critical care time does not reflect separately billable procedures or procedure time, teaching time or supervisory time of PA/NP/Med student/Med Resident etc but could involve care discussion time.       Charlott Holler [facility] 02/01/2023 4:31 PM  Pager: see AMION  If no response to pager , please call critical care on call (see AMION) until 7pm After 7:00 pm call Elink

## 2023-02-01 NOTE — ED Notes (Signed)
ED TO INPATIENT HANDOFF REPORT  ED Nurse Name and Phone #: Jannet Mantis Name/Age/Gender Francisco [name] 62 y.o. male Room/Bed: 033C/033C  Code Status   Code Status: Full Code  Home/SNF/Other Home Patient oriented to: self, place, time, and situation Is this baseline? Yes   Triage Complete: Triage complete  Chief Complaint Respiratory failure [facility]) [J96.90]  Triage Note Per [facility] pt coming from home c/o shortness of breath and COPD. Was seen at [facility] last night for same. Given 10mg  albuterol and 0.5 atrovent and 125 mg solu medrol en route. Patient tripoding on arrival. Placed on 4 L [place].    Allergies Allergies  Allergen Reactions   Ivp Dye [Iodinated Contrast Media] Other (See Comments)    Seizures    Tramadol Other (See Comments)    Seizures     Level of Care/Admitting Diagnosis ED Disposition     ED Disposition  Admit   Condition  --   Comment  Hospital Area: [facility] [[id number]]  Level of Care: ICU [6]  May admit patient to [facility] or [facility] if equivalent level of care is available:: Yes  Covid Evaluation: Confirmed COVID Negative  Diagnosis: Respiratory failure [facility]) [[id number]]  Admitting Physician: Charlott Holler [[id number]]  Attending Physician: Charlott Holler [[id number]]  Certification:: I certify this patient will need inpatient services for at least 2 midnights  Expected Medical Readiness: 02/07/2023          B Medical/Surgery History Past Medical History:  Diagnosis Date   CHF (congestive heart failure) (HCC)    COPD (chronic obstructive pulmonary disease) (HCC)    COPD with acute exacerbation (HCC) 10/21/2021   Coronary artery disease    a. s/p prior PCI.   Hepatitis-C    Hypertension    IV drug abuse (HCC)    a. 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   Medically noncompliant    MI (myocardial infarction) (HCC)    Tobacco abuse    Past Surgical History:  Procedure Laterality Date   BACK SURGERY      NECK SURGERY     RIGHT/LEFT HEART CATH AND CORONARY ANGIOGRAPHY N/A 04/30/2019   Procedure: RIGHT/LEFT HEART CATH AND CORONARY ANGIOGRAPHY;  Surgeon: Marykay Lex, MD;  Location: [facility] INVASIVE CV LAB;  Service: Cardiovascular;  Laterality: N/A;   stints       A IV Location/Drains/Wounds Patient Lines/Drains/Airways Status     Active Line/Drains/Airways     Name Placement date Placement time Site Days   Peripheral IV 01/31/23 20 G 1" Left Antecubital 01/31/23  1809  Antecubital  1            Intake/Output Last 24 hours No intake or output data in the 24 hours ending 02/01/23 1636  Labs/Imaging Results for orders placed or performed during the hospital encounter of 02/01/23 (from the past 48 hour(s))  Basic metabolic panel     Status: Abnormal   Collection Time: 02/01/23  1:42 PM  Result Value Ref Range   Sodium 144 135 - 145 mmol/L   Potassium 3.9 3.5 - 5.1 mmol/L    Comment: HEMOLYSIS AT THIS LEVEL MAY AFFECT RESULT   Chloride 100 98 - 111 mmol/L   CO2 33 (H) 22 - 32 mmol/L   Glucose, Bld 110 (H) 70 - 99 mg/dL    Comment: Glucose reference range applies only to samples taken after fasting for at least 8 hours.   BUN 16 8 - 23 mg/dL   Creatinine, Ser  0.99 0.61 - 1.24 mg/dL   Calcium 9.3 8.9 - 08.6 mg/dL   GFR, Estimated >57 >84 mL/min    Comment: (NOTE) Calculated using the CKD-EPI Creatinine Equation (2021)    Anion gap 11 5 - 15    Comment: Performed at [facility], [address] [place], [place] [address]  CBC     Status: Abnormal   Collection Time: 02/01/23  1:42 PM  Result Value Ref Range   WBC 12.9 (H) 4.0 - 10.5 K/uL   RBC 5.12 4.22 - 5.81 MIL/uL   Hemoglobin 16.2 13.0 - 17.0 g/dL   HCT 52.8 41.3 - 24.4 %   MCV 98.6 80.0 - 100.0 fL   MCH 31.6 26.0 - 34.0 pg   MCHC 32.1 30.0 - 36.0 g/dL   RDW 01.0 27.2 - 53.6 %   Platelets 269 150 - 400 K/uL   nRBC 0.0 0.0 - 0.2 %    Comment: Performed at [facility], [address] [place], [place]  [address]  Troponin I (High Sensitivity)     Status: None   Collection Time: 02/01/23  1:42 PM  Result Value Ref Range   Troponin I (High Sensitivity) 17 <18 ng/L    Comment: (NOTE) Elevated high sensitivity troponin I (hsTnI) values and significant  changes across serial measurements may suggest ACS but many other  chronic and acute conditions are known to elevate hsTnI results.  Refer to the "Links" section for chest pain algorithms and additional  guidance. Performed at [facility], [address], [place], [place] [address]   I-Stat venous blood gas, [facility], [facility], [facility])     Status: Abnormal   Collection Time: 02/01/23  1:55 PM  Result Value Ref Range   pH, Ven 7.393 7.25 - 7.43   pCO2, Ven 54.4 44 - 60 mmHg   pO2, Ven 124 (H) 32 - 45 mmHg   Bicarbonate 33.1 (H) 20.0 - 28.0 mmol/L   TCO2 35 (H) 22 - 32 mmol/L   O2 Saturation 99 %   Acid-Base Excess 6.0 (H) 0.0 - 2.0 mmol/L   Sodium 142 135 - 145 mmol/L   Potassium 3.6 3.5 - 5.1 mmol/L   Calcium, Ion 1.05 (L) 1.15 - 1.40 mmol/L   HCT 50.0 39.0 - 52.0 %   Hemoglobin 17.0 13.0 - 17.0 g/dL   Sample type VENOUS    DG Chest Portable 1 View  Result Date: 02/01/2023 CLINICAL DATA:  Major shortness of breath and chest pain EXAM: PORTABLE CHEST 1 VIEW COMPARISON:  01/31/2023, CT 08/13/2021 FINDINGS: Hyperinflation and emphysema. No acute airspace disease, pleural effusion, or pneumothorax. Normal cardiomediastinal silhouette. IMPRESSION: Hyperinflation and emphysema. Electronically Signed   By: Jasmine Pang M.D.   On: 02/01/2023 16:17   DG Chest 2 View  Result Date: 01/31/2023 CLINICAL DATA:  Wheezing and shortness of breath. EXAM: CHEST - 2 VIEW COMPARISON:  January 28, 2023 FINDINGS: The heart size and mediastinal contours are within normal limits. Both lungs are clear. The visualized skeletal structures are unremarkable. IMPRESSION: No active cardiopulmonary disease. Electronically Signed   By: Aram Candela M.D.   On:  01/31/2023 21:13    Pending Labs Unresulted Labs (From admission, onward)     Start     Ordered   02/02/23 0500  CBC  Tomorrow morning,   R        02/01/23 1627   02/02/23 0500  Basic metabolic panel  Tomorrow morning,   R  02/01/23 1627   02/02/23 0500  Blood gas, arterial  Tomorrow morning,   R        02/01/23 1627   02/02/23 0500  Magnesium  Tomorrow morning,   R        02/01/23 1627   02/02/23 0500  Phosphorus  Tomorrow morning,   R        02/01/23 1627   02/01/23 1627  HIV Antibody (routine testing w rflx)  (HIV Antibody (Routine testing w reflex) panel)  Once,   R        02/01/23 1627   02/01/23 1627  CBC  (heparin)  Once,   R       Comments: Baseline for heparin therapy IF NOT ALREADY DRAWN.  Notify MD if PLT < 100 K.    02/01/23 1627            Vitals/Pain Today's Vitals   02/01/23 1357 02/01/23 1445 02/01/23 1500 02/01/23 1536  BP:  (!) 184/173 (!) 165/100   Pulse: (!) 122 (!) 128 (!) 124 (!) 116  Resp: (!) 25 (!) 22 (!) 25 (!) 23  Temp:      TempSrc:      SpO2: 100% 100% 100% 100%  Weight:      Height:      PainSc:        Isolation Precautions No active isolations  Medications Medications  methylPREDNISolone sodium succinate (SOLU-MEDROL) 40 mg/mL injection 40 mg (has no administration in time range)  docusate sodium (COLACE) capsule 100 mg (has no administration in time range)  polyethylene glycol (MIRALAX / GLYCOLAX) packet 17 g (has no administration in time range)  heparin injection 5,000 Units (has no administration in time range)  amLODipine (NORVASC) tablet 5 mg (has no administration in time range)  aspirin EC tablet 81 mg (has no administration in time range)  atorvastatin (LIPITOR) tablet 40 mg (has no administration in time range)  lisinopril (ZESTRIL) tablet 20 mg (has no administration in time range)  metoprolol succinate (TOPROL-XL) 24 hr tablet 25 mg (has no administration in time range)  revefenacin (YUPELRI) nebulizer solution  175 mcg (has no administration in time range)  budesonide (PULMICORT) nebulizer solution 0.25 mg (has no administration in time range)  arformoterol (BROVANA) nebulizer solution 15 mcg (has no administration in time range)  nicotine (NICODERM CQ - dosed in mg/24 hours) patch 21 mg (has no administration in time range)  ipratropium-albuterol (DUONEB) 0.5-2.5 (3) MG/3ML nebulizer solution 3 mL (3 mLs Nebulization Given 02/01/23 1342)  ipratropium-albuterol (DUONEB) 0.5-2.5 (3) MG/3ML nebulizer solution (  Given 02/01/23 1354)  albuterol (PROVENTIL) (2.5 MG/3ML) 0.083% nebulizer solution 10 mg (10 mg Nebulization Given 02/01/23 1354)  nitroGLYCERIN (NITROSTAT) SL tablet 0.4 mg (0.4 mg Sublingual Given 02/01/23 1423)  LORazepam (ATIVAN) injection 0.5 mg (0.5 mg Intravenous Given 02/01/23 1432)    Mobility walks     Focused Assessments Pulmonary Assessment Handoff:  Lung sounds: Bilateral Breath Sounds: Diminished L Breath Sounds: Inspiratory wheezes, Expiratory wheezes R Breath Sounds: Inspiratory wheezes, Expiratory wheezes O2 Device: (S) Bi-PAP O2 Flow Rate (L/min): 8 L/min    R Recommendations: See Admitting Provider Note  Report given to:   Additional Notes: Pt is AOX4, currently resting, Bipap on, was very anxious and increased work of breathing, anxious, diaphoretic, had Ativan and he has been able to rest

## 2023-02-01 NOTE — Progress Notes (Signed)
Pt placed on Bipap by RT due to increased WOB. Pt tolerating fairly well pt was very anxious. However, Pt states "he would give it a try". RT, RN and NT at bedside, MD aware. RT will monitor as needed.     02/01/23 1445  Therapy Vitals  Pulse Rate (!) 128  Resp (!) 22  BP (!) 184/173  MEWS Score/Color  MEWS Score 4  MEWS Score Color Red  Respiratory Assessment  Assessment Type Assess only  Respiratory Pattern Regular;Unlabored  Chest Assessment Chest expansion symmetrical  Cough Non-productive  Bilateral Breath Sounds Diminished  Oxygen Therapy/Pulse Ox  O2 Device (S)  Bi-PAP  O2 Therapy Oxygen  FiO2 (%) 40 %  SpO2 100 %

## 2023-02-01 NOTE — Telephone Encounter (Signed)
Pt was called by the front office yesterday afternoon d/t having an open appt this am - pt stated he will be working all day today and unable to come in.

## 2023-02-01 NOTE — Progress Notes (Signed)
eLink Physician-Brief Progress Note Patient Name: Francisco [name] DOB: [birth year] MRN: [id number]   Date of Service  02/01/2023  HPI/Events of Note  Per RN, patient rec'd tylenol an hr ago, no relief in back pain.  Getting up to chair now  Also, pt c/o feeling anxious/restless.  Per RN, had precedex ordered for BiPAP but d/c'd.  Asking if you will reorder  Seen on recliner resting. Not in distress  eICU Interventions  Ordered morphine 1 mg prn for pain and will hold of Precedex Reassess if restlessness will recur Discussed with bedside RN     Intervention Category Minor Interventions: Agitation / anxiety - evaluation and management  Darl Pikes 02/01/2023, 10:15 PM

## 2023-02-02 DIAGNOSIS — J9601 Acute respiratory failure with hypoxia: Secondary | ICD-10-CM

## 2023-02-02 DIAGNOSIS — J9602 Acute respiratory failure with hypercapnia: Secondary | ICD-10-CM

## 2023-02-02 LAB — POCT I-STAT 7, (LYTES, BLD GAS, ICA,H+H)
Acid-Base Excess: 5 mmol/L — ABNORMAL HIGH (ref 0.0–2.0)
Bicarbonate: 34.5 mmol/L — ABNORMAL HIGH (ref 20.0–28.0)
Calcium, Ion: 1.24 mmol/L (ref 1.15–1.40)
HCT: 46 % (ref 39.0–52.0)
Hemoglobin: 15.6 g/dL (ref 13.0–17.0)
O2 Saturation: 97 %
Patient temperature: 97.7
Potassium: 4.3 mmol/L (ref 3.5–5.1)
Sodium: 140 mmol/L (ref 135–145)
TCO2: 37 mmol/L — ABNORMAL HIGH (ref 22–32)
pCO2 arterial: 66.8 mm[Hg] (ref 32–48)
pH, Arterial: 7.318 — ABNORMAL LOW (ref 7.35–7.45)
pO2, Arterial: 95 mm[Hg] (ref 83–108)

## 2023-02-02 LAB — BASIC METABOLIC PANEL
Anion gap: 8 (ref 5–15)
BUN: 17 mg/dL (ref 8–23)
CO2: 32 mmol/L (ref 22–32)
Calcium: 8.8 mg/dL — ABNORMAL LOW (ref 8.9–10.3)
Chloride: 101 mmol/L (ref 98–111)
Creatinine, Ser: 0.85 mg/dL (ref 0.61–1.24)
GFR, Estimated: >60 mL/min (ref >60)
Glucose, Bld: 101 mg/dL — ABNORMAL HIGH (ref 70–99)
Potassium: 4 mmol/L (ref 3.5–5.1)
Sodium: 141 mmol/L (ref 135–145)

## 2023-02-02 LAB — CBC
HCT: 50.5 % (ref 39.0–52.0)
Hemoglobin: 16.1 g/dL (ref 13.0–17.0)
MCH: 32 pg (ref 26.0–34.0)
MCHC: 31.9 g/dL (ref 30.0–36.0)
MCV: 100.4 fL — ABNORMAL HIGH (ref 80.0–100.0)
Platelets: 234 10*3/uL (ref 150–400)
RBC: 5.03 MIL/uL (ref 4.22–5.81)
RDW: 13.8 % (ref 11.5–15.5)
WBC: 11.4 10*3/uL — ABNORMAL HIGH (ref 4.0–10.5)
nRBC: 0 % (ref 0.0–0.2)

## 2023-02-02 LAB — MAGNESIUM: Magnesium: 2.3 mg/dL (ref 1.7–2.4)

## 2023-02-02 LAB — PHOSPHORUS: Phosphorus: 3.7 mg/dL (ref 2.5–4.6)

## 2023-02-02 MED ORDER — ORAL CARE MOUTH RINSE: 15.0000 mL | OROMUCOSAL | Status: DC | PRN

## 2023-02-02 MED ORDER — AMLODIPINE BESYLATE 10 MG PO TABS
10.0000 mg | ORAL_TABLET | Freq: Every day | ORAL | Status: DC
  Administered 2023-02-03 – 2023-02-05 (×3): 10 mg via ORAL
  Filled 2023-02-02 (×3): qty 1

## 2023-02-02 MED ORDER — IPRATROPIUM BROMIDE 0.02 % IN SOLN: 0.5000 mg | Freq: Four times a day (QID) | RESPIRATORY_TRACT | Status: DC

## 2023-02-02 MED ORDER — METHYLPREDNISOLONE SODIUM SUCC 40 MG IJ SOLR
40.0000 mg | Freq: Once | INTRAMUSCULAR | Status: AC
  Administered 2023-02-02: 40 mg via INTRAVENOUS
  Filled 2023-02-02: qty 1

## 2023-02-02 MED ORDER — HYDROCODONE-ACETAMINOPHEN 5-325 MG PO TABS
1.0000 | ORAL_TABLET | Freq: Once | ORAL | Status: AC
  Administered 2023-02-02: 1 via ORAL
  Filled 2023-02-02: qty 1

## 2023-02-02 MED ORDER — MORPHINE SULFATE (PF) 2 MG/ML IV SOLN
2.0000 mg | Freq: Four times a day (QID) | INTRAVENOUS | Status: DC | PRN
  Administered 2023-02-02: 2 mg via INTRAVENOUS
  Filled 2023-02-02: qty 1

## 2023-02-02 MED ORDER — IPRATROPIUM BROMIDE 0.02 % IN SOLN
0.5000 mg | Freq: Four times a day (QID) | RESPIRATORY_TRACT | Status: DC | PRN
  Administered 2023-02-03: 0.5 mg via RESPIRATORY_TRACT
  Filled 2023-02-02: qty 2.5

## 2023-02-02 MED ORDER — LORAZEPAM 2 MG/ML IJ SOLN
1.0000 mg | Freq: Once | INTRAMUSCULAR | Status: AC
  Administered 2023-02-02: 1 mg via INTRAVENOUS
  Filled 2023-02-02: qty 1

## 2023-02-02 MED ORDER — IPRATROPIUM BROMIDE 0.02 % IN SOLN
RESPIRATORY_TRACT | Status: AC
  Administered 2023-02-02: 0.5 mg via RESPIRATORY_TRACT
  Filled 2023-02-02: qty 2.5

## 2023-02-02 MED ORDER — AMLODIPINE BESYLATE 5 MG PO TABS
5.0000 mg | ORAL_TABLET | Freq: Once | ORAL | Status: AC
  Administered 2023-02-02: 5 mg via ORAL
  Filled 2023-02-02: qty 1

## 2023-02-02 MED ORDER — AMOXICILLIN-POT CLAVULANATE 875-125 MG PO TABS
1.0000 | ORAL_TABLET | Freq: Two times a day (BID) | ORAL | Status: DC
  Administered 2023-02-02 – 2023-02-05 (×7): 1 via ORAL
  Filled 2023-02-02 (×10): qty 1

## 2023-02-02 MED ORDER — ENOXAPARIN SODIUM 40 MG/0.4ML IJ SOSY
40.0000 mg | PREFILLED_SYRINGE | INTRAMUSCULAR | Status: DC
  Administered 2023-02-02 – 2023-02-10 (×8): 40 mg via SUBCUTANEOUS
  Filled 2023-02-02 (×8): qty 0.4

## 2023-02-02 MED ORDER — ALPRAZOLAM 0.25 MG PO TABS
0.2500 mg | ORAL_TABLET | Freq: Two times a day (BID) | ORAL | Status: DC | PRN
  Filled 2023-02-02: qty 1

## 2023-02-02 NOTE — Progress Notes (Signed)
NAME:  Francisco [name], MRN:  [id number], DOB:  17-Sep-1960, LOS: 1 ADMISSION DATE:  02/01/2023, CONSULTATION DATE:  02/02/2023 REFERRING MD:  Karren Burly, MD, CHIEF COMPLAINT:  respiratory failure   History of Present Illness:  Francisco [name] is a 62 y.o. man with 100 pack year smoking history who presents with COPD exacerbation. Previous 2 ppd smoker, now down to 1/2 ppd. Quit previously cold [place]. Long standing dyspnea with frequent flares requiring ED visit. Never been intubated. Breathing has worsened a lot in the last year. 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   Here with 4-5 days of shortness of breath. No cough, fevers, chills, sick contacts sputum production. No lower extremity edema or orthopnea. Just profound inability to catch his breath.   Lives at home with roommate/coworker who also smokes occasionally. Interested in quitting and wants to get consistent care for his copd.   Pertinent  Medical History  COPD HTN HLD CAD s/p PCI several years ago   Significant Hospital Events: Including procedures, antibiotic start and stop dates in addition to other pertinent events   10/15 admitted to the hospital with COPD exacerbation 10/16 improved, anxious, better with Ativan.  Ready for transfer out of the ICU.  Interim History / Subjective:  Doing okay on nasal cannula.  Not requiring BiPAP.  ABG okay.  Very anxious this morning.  Improved with IV anxiolytics.  Objective   Blood pressure (!) 153/94, pulse (!) 117, temperature (!) 97.1 F (36.2 C), temperature source Axillary, resp. rate (!) 25, height 6' (1.829 m), weight 83.9 kg, SpO2 94%.    FiO2 (%):  [40 %] 40 %   Intake/Output Summary (Last 24 hours) at 02/02/2023 1056 Last data filed at 02/02/2023 1000 Gross per 24 hour  Intake 460 ml  Output 1000 ml  Net -540 ml   Filed Weights   02/01/23 1340 02/02/23 0400  Weight: 83.9 kg 83.9 kg    Examination: General:  appears older than state age, tachypnic in tripod position HENT: on bipap Lungs: diminished, minimal air entry, end expiratory wheeze Cardiovascular: tachycardic, regular, diminished Abdomen: soft, nontender Extremities: no edema Neuro: normal speech Aox4 anxious   Resolved Hospital Problem list     Assessment & Plan:  Acute hypoxemic and hypercapnic respiratory failure secondary to  COPD with acute exacerbation Tobacco use disorder - solumedrol 40 mg daily continue through to the exact 16, transition to oral prednisone with plan to taper 40 mg for 5 days and 20 mg for 5 days then stop - triple nebulized therapy - nicotine patch -BiPAP nightly  Anxiety/panic attacks: Does not appear to be on home meds.  Suspect situational.  Hope will improve with treatment of underlying COPD exacerbation. -- Xanax as needed, would not discharge on this medication  CAD HTN HLD Continue home meds, reordered  Best Practice (right click and "Reselect all SmartList Selections" daily)   Diet/type: Regular consistency (see orders) DVT prophylaxis: prophylactic heparin  GI prophylaxis: N/A Lines: N/A Foley:  N/A Code Status:  full code Last date of multidisciplinary goals of care discussion [patient updated at bedside 10/16]  Labs   CBC: Recent Labs  Lab 01/28/23 2145 01/28/23 2147 01/31/23 1846 02/01/23 1342 02/01/23 1355 02/01/23 1828 02/02/23 0605 02/02/23 0857  WBC 7.3  --  11.1* 12.9*  --  12.2*  --  11.4*  NEUTROABS 4.9  --  8.0*  --   --   --   --   --  HGB 15.9   < > 15.7 16.2 17.0 15.8 15.6 16.1  HCT 48.4   < > 49.9 50.5 50.0 49.7 46.0 50.5  MCV 95.5  --  99.2 98.6  --  96.9  --  100.4*  PLT 247  --  276 269  --  242  --  234   < > = values in this interval not displayed.    Basic Metabolic Panel: Recent Labs  Lab 01/28/23 2145 01/28/23 2147 01/31/23 1846 02/01/23 1342 02/01/23 1355 02/02/23 0605 02/02/23 0857  NA 137   < > 140 144 142 140 141  K 3.5   < > 3.5  3.9 3.6 4.3 4.0  CL 99  --  101 100  --   --  101  CO2 26  --  30 33*  --   --  32  GLUCOSE 104*  --  111* 110*  --   --  101*  BUN 13  --  17 16  --   --  17  CREATININE 0.98  --  1.02 0.99  --   --  0.85  CALCIUM 9.3  --  8.9 9.3  --   --  8.8*  MG  --   --   --   --   --   --  2.3  PHOS  --   --   --   --   --   --  3.7   < > = values in this interval not displayed.   GFR: Estimated Creatinine Clearance: 98.9 mL/min (by C-G formula based on SCr of 0.85 mg/dL). Recent Labs  Lab 01/31/23 1846 02/01/23 1342 02/01/23 1828 02/02/23 0857  WBC 11.1* 12.9* 12.2* 11.4*    Liver Function Tests: Recent Labs  Lab 01/28/23 2145 01/31/23 1846  AST 29 19  ALT 48* 41  ALKPHOS 59 59  BILITOT 0.7 0.5  PROT 6.6 7.0  ALBUMIN 3.6 3.9   No results for input(s): "LIPASE", "AMYLASE" in the last 168 hours. No results for input(s): "AMMONIA" in the last 168 hours.  ABG    Component Value Date/Time   PHART 7.318 (L) 02/02/2023 0605   PCO2ART 66.8 (HH) 02/02/2023 0605   PO2ART 95 02/02/2023 0605   HCO3 34.5 (H) 02/02/2023 0605   TCO2 37 (H) 02/02/2023 0605   O2SAT 97 02/02/2023 0605     Coagulation Profile: No results for input(s): "INR", "PROTIME" in the last 168 hours.  Cardiac Enzymes: No results for input(s): "CKTOTAL", "CKMB", "CKMBINDEX", "TROPONINI" in the last 168 hours.  HbA1C: Hgb A1c MFr Bld  Date/Time Value Ref Range Status  08/18/2019 08:38 AM 5.5 4.8 - 5.6 % Final    Comment:    RESULTS CONFIRMED BY MANUAL DILUTION (NOTE) Pre diabetes:          5.7%-6.4% Diabetes:              >6.4% Glycemic control for   <7.0% adults with diabetes   04/29/2019 03:03 AM 5.5 4.8 - 5.6 % Final    Comment:    (NOTE) Pre diabetes:          5.7%-6.4% Diabetes:              >6.4% Glycemic control for   <7.0% adults with diabetes     CBG: Recent Labs  Lab 02/01/23 1744 02/01/23 1931 02/01/23 2333  GLUCAP 135* 150* 139*    Review of Systems:   Shortness of breath, no  fevers, chills, night sweats,  hemoptysis or weight loss.   Past Medical History:  He,  has a past medical history of CHF (congestive heart failure) (HCC), COPD (chronic obstructive pulmonary disease) (HCC), COPD with acute exacerbation (HCC) (10/21/2021), Coronary artery disease, Hepatitis-C, Hypertension, IV drug abuse (HCC), Medically noncompliant, MI (myocardial infarction) (HCC), and Tobacco abuse.   Surgical History:   Past Surgical History:  Procedure Laterality Date   BACK SURGERY     NECK SURGERY     RIGHT/LEFT HEART CATH AND CORONARY ANGIOGRAPHY N/A 04/30/2019   Procedure: RIGHT/LEFT HEART CATH AND CORONARY ANGIOGRAPHY;  Surgeon: Marykay Lex, MD;  Location: [facility] INVASIVE CV LAB;  Service: Cardiovascular;  Laterality: N/A;   stints       Social History:   reports that he has been smoking cigarettes. He has never used smokeless tobacco. He reports current drug use. Drug: Methamphetamines. He reports that he does not drink alcohol.   Family History:  His family history includes Heart disease in his paternal grandfather; Rheum arthritis in his mother.   Allergies Allergies  Allergen Reactions   Ivp Dye [Iodinated Contrast Media] Other (See Comments)    Seizures    Tramadol Other (See Comments)    Seizures    Doxycycline Other (See Comments)    Burning sensation to his hands.     Home Medications  Prior to Admission medications   Medication Sig Start Date End Date Taking? Authorizing Provider  acetaminophen (TYLENOL) 325 MG tablet Take 650 mg by mouth every 6 (six) hours as needed for moderate pain or headache.    [provider]  albuterol (PROVENTIL) (2.5 MG/3ML) 0.083% nebulizer solution Use 1 vial (2.5 mg total) by nebulization every 6 (six) hours as needed for wheezing or shortness of breath. 11/04/22   Nooruddin, Jason Fila, MD  albuterol (VENTOLIN HFA) 108 (90 Base) MCG/ACT inhaler Inhale 2 puffs into the lungs every 4 (four) hours as needed for wheezing or  shortness of breath. 12/25/22   Nooruddin, Jason Fila, MD  amLODipine (NORVASC) 5 MG tablet Take 1 tablet (5 mg total) by mouth daily. 11/29/22   Nooruddin, Jason Fila, MD  aspirin EC 81 MG tablet Take 81 mg by mouth daily. Swallow whole.    [provider]  atorvastatin (LIPITOR) 40 MG tablet Take 1 tablet (40 mg total) by mouth daily. 11/09/22   Nooruddin, Jason Fila, MD  doxycycline (VIBRAMYCIN) 100 MG capsule Take 1 capsule (100 mg total) by mouth 2 (two) times daily for 7 days. 01/28/23 02/04/23  Lonell Grandchild, MD  Fluticasone-Umeclidin-Vilant (TRELEGY ELLIPTA) 100-62.5-25 MCG/ACT AEPB Please use 1 puff daily. 09/27/22   Nooruddin, Jason Fila, MD  furosemide (LASIX) 40 MG tablet Take 1 tablet (40 mg total) by mouth daily. 05/05/22   Adron Bene, MD  lisinopril (ZESTRIL) 40 MG tablet Take 1/2 tablet (20 mg total) by mouth daily. 08/25/22   Quincy Simmonds, MD  metoprolol succinate (TOPROL-XL) 25 MG 24 hr tablet Take 1 tablet (25 mg total) by mouth in the morning. 11/29/22   Nooruddin, Jason Fila, MD  nicotine (NICODERM CQ - DOSED IN MG/24 HOURS) 14 mg/24hr patch Place 1 patch (14 mg total) onto the skin daily. Patient not taking: Reported on 08/13/2021 06/24/21   Adron Bene, MD  nitroGLYCERIN (NITROSTAT) 0.4 MG SL tablet Place 1 tablet (0.4 mg total) under the tongue every 5 (five) minutes x 3 doses as needed for chest pain. 10/14/22   Nooruddin, Jason Fila, MD  predniSONE (DELTASONE) 50 MG tablet Take 1 tablet (50 mg total) by mouth daily  with breakfast for 5 doses. 01/31/23 02/05/23  Dolphus Jenny, PA-C     Critical care time:  n/a      Karren Burly [facility] 02/02/2023 10:56 AM  Pager: see AMION  If no response to pager , please call critical care on call (see AMION) until 7pm After 7:00 pm call Elink

## 2023-02-02 NOTE — Progress Notes (Signed)
eLink Physician-Brief Progress Note Patient Name: Francisco [name] DOB: Dec 20, 1960 MRN: [id number]   Date of Service  02/02/2023  HPI/Events of Note  RN reporting pt c/o SOB.  Has albuterol nebs ordered q6h, last at 2250.  However, pt asking not to get albuterol again as he becomes jittery  eICU Interventions  Ordered ipratropium neb prn     Intervention Category Intermediate Interventions: Other:  Darl Pikes 02/02/2023, 4:08 AM

## 2023-02-02 NOTE — Progress Notes (Signed)
Phlebotomy unable to obtain labs this AM. Technician reported the day shift phlebotomist will attempt labs after 0630.

## 2023-02-02 NOTE — Progress Notes (Signed)
eLink Physician-Brief Progress Note Patient Name: Satvik Parco DOB: 1960/11/06 MRN: [id number]   Date of Service  02/02/2023  HPI/Events of Note  Pain in low back, was on morphine in ICU per RN discussion. Not on narcotics at home.  - Norco once ordered. He uis awake and alert. Even though pco2 elevated, chronic type 2 failure from COPD. Has wheezing, on stethoscope none. Getting  on nebs.   eICU Interventions  40 mg methyl pred IV once ordered     Intervention Category Intermediate Interventions: Other:  Ranee Gosselin 02/02/2023, 9:24 PM

## 2023-02-03 ENCOUNTER — Other Ambulatory Visit (HOSPITAL_COMMUNITY): Payer: Self-pay

## 2023-02-03 DIAGNOSIS — J9602 Acute respiratory failure with hypercapnia: Secondary | ICD-10-CM | POA: Diagnosis not present

## 2023-02-03 DIAGNOSIS — J9601 Acute respiratory failure with hypoxia: Secondary | ICD-10-CM | POA: Diagnosis not present

## 2023-02-03 DIAGNOSIS — F17210 Nicotine dependence, cigarettes, uncomplicated: Secondary | ICD-10-CM

## 2023-02-03 DIAGNOSIS — J441 Chronic obstructive pulmonary disease with (acute) exacerbation: Secondary | ICD-10-CM | POA: Diagnosis not present

## 2023-02-03 LAB — CBC
HCT: 49.1 % (ref 39.0–52.0)
Hemoglobin: 15.8 g/dL (ref 13.0–17.0)
MCH: 31.6 pg (ref 26.0–34.0)
MCHC: 32.2 g/dL (ref 30.0–36.0)
MCV: 98.2 fL (ref 80.0–100.0)
Platelets: 244 10*3/uL (ref 150–400)
RBC: 5 MIL/uL (ref 4.22–5.81)
RDW: 13.4 % (ref 11.5–15.5)
WBC: 14.5 10*3/uL — ABNORMAL HIGH (ref 4.0–10.5)
nRBC: 0 % (ref 0.0–0.2)

## 2023-02-03 MED ORDER — HYDROXYZINE HCL 25 MG PO TABS
25.0000 mg | ORAL_TABLET | Freq: Three times a day (TID) | ORAL | Status: DC | PRN
  Administered 2023-02-03 – 2023-02-05 (×4): 25 mg via ORAL
  Filled 2023-02-03 (×4): qty 1

## 2023-02-03 MED ORDER — OXYCODONE-ACETAMINOPHEN 5-325 MG PO TABS
1.0000 | ORAL_TABLET | Freq: Four times a day (QID) | ORAL | Status: DC | PRN
  Administered 2023-02-03 – 2023-02-04 (×3): 2 via ORAL
  Filled 2023-02-03 (×3): qty 2

## 2023-02-03 MED ORDER — ALPRAZOLAM 0.25 MG PO TABS
0.2500 mg | ORAL_TABLET | Freq: Two times a day (BID) | ORAL | Status: DC | PRN
  Administered 2023-02-03: 0.25 mg via ORAL
  Filled 2023-02-03: qty 1

## 2023-02-03 MED ORDER — CLONAZEPAM 0.5 MG PO TABS
0.5000 mg | ORAL_TABLET | Freq: Two times a day (BID) | ORAL | Status: DC
  Administered 2023-02-03: 0.5 mg via ORAL
  Filled 2023-02-03: qty 1

## 2023-02-03 MED ORDER — PREDNISONE 20 MG PO TABS: 20.0000 mg | ORAL_TABLET | Freq: Every day | ORAL | Status: DC

## 2023-02-03 MED ORDER — CLONAZEPAM 0.5 MG PO TBDP: 0.5000 mg | ORAL_TABLET | Freq: Two times a day (BID) | ORAL | Status: DC

## 2023-02-03 MED ORDER — PREDNISONE 20 MG PO TABS: 40.0000 mg | ORAL_TABLET | Freq: Every day | ORAL | Status: DC

## 2023-02-03 NOTE — Progress Notes (Signed)
HD#2 SUBJECTIVE:  Patient Summary: Francisco [name] is a 62 y.o. with a pertinent PMH of tobacco use (100 pack year), COPD, HTN, HLD, CAD s/p PCI, who presented with 4-5 days of shortness of breath and admitted for COPD exacerbation. At time of admission his only complaint was inability to catch his breath. No cough, fever, chills, sick contacts, sputum production, peripheral edema, orthopnea.   He was admitted to the ICU for acute hypoxemic and hypercapnic respiratory failure 2/2 COPD exacerbation where he was placed on BiPAP due to increased work of breathing and severe anxiety. He was started on solumedrol 40 mg daily, triple therapy nebulizer.  He has had complaints of severe back pain and anxiety has been prevalent throughout admission. He had tremors and on questioning denied EtOH use but did endorse occasional methamphetamine use.  This AM he is on 5L [place] with appropriate SpO2 levels. He did wear BiPAP overnight. Tachycardic to 110s, intermittently tachypneic. Highly anxious.  OBJECTIVE:  Vital Signs: Vitals:   02/03/23 0520 02/03/23 0542 02/03/23 0556 02/03/23 0733  BP: (!) 157/93   (!) 158/108  Pulse: (!) 104 99 (!) 103 (!) 106  Resp: 20 16 20 19   Temp:    97.7 F (36.5 C)  TempSrc:    Oral  SpO2: 91% 96% 96% 96%  Weight:      Height:       Supplemental O2: Nasal Cannula SpO2: 96 % O2 Flow Rate (L/min): 5 L/min FiO2 (%): 40 %  Filed Weights   02/01/23 1340 02/02/23 0400 02/03/23 0500  Weight: 83.9 kg 83.9 kg 81.4 kg     Intake/Output Summary (Last 24 hours) at 02/03/2023 0758 Last data filed at 02/03/2023 0600 Gross per 24 hour  Intake 640 ml  Output 1000 ml  Net -360 ml   Net IO Since Admission: -600 mL [02/03/23 0758]  Physical Exam: Constitutional:Chronically ill appearing gentleman seated upright in bed, receiving breathing treatment. In no acute distress. Cardio:Regular rate and rhythm. No murmurs, rubs, or gallops. Pulm:Decreased breath sounds to bilateral  lung bases with expiratory wheezing in bilateral mid and upper lung fields. On nebulizer treatment, not in acute distress, no increased work of breathing. UJW:[id number] for extremity edema. Midline lumbar vertebral tenderness.  Skin:Warm and dry.  Neuro:Alert and oriented x3. No focal deficit noted. Psych:Very anxious appearing.  Patient Lines/Drains/Airways Status     Active Line/Drains/Airways     Name Placement date Placement time Site Days   Peripheral IV 01/31/23 20 G 1" Left Antecubital 01/31/23  1809  Antecubital  3             ASSESSMENT/PLAN:  Assessment: Principal Problem:   Respiratory failure (HCC)   Plan: Acute hypoxemic and hypercapnic respiratory failure 2/2 COPD exacerbation Tobacco use disorder Remains on increased supplemental O2 with room to wean down for goal SpO2 88-92%. On exam, he is receiving a breathing treatment and has diffuse expiratory wheezes on bilateral upper and mid lung fields with decreased breath sounds at bilateral bases.. Plan: -Augmentin 875-125 mg BID day 2/5 -Continue albuterol nebulizer q6h PRN wheezing, shortness of breath, ipratropium nebulizer q6h PRN shortness of breath -Continue brovana nebulizer BID, pulmicort nebulizer BID, yupelri nebulizer daily -Continue solumedrol 40 mg daily for 1 more dose; transition to PO prednisone 40 mg 10/19 through 10/23 followed by 20 mg daily from 10/24 through 10/28 -Nicotine patch -BiPAP daily at bedtime -Wean supplemental O2 as able, goal SpO2 88-92% -Needs formal PFTs and pulmonology f/u as OP  Hx  combined systolic and diastolic CHF HTN CAD s/p PCI  HLD Euvolemic on exam. Mildly hypertensive. Currently on OP regimen which includes amlodipine 10 mg daily, lisinopril 20 mg daily, metoprolol 25 mg daily. Plan: -Continue home amlodipine, lisinopril, metoprolol -Continue home ASA 81 mg daily, atorvastatin 40 mg daily  Anxiety Has been very prevalent this hospitalization, suspected to be  situational and treated with PO alprazolam. Not on outpatient anxiolytic therapy. Plan: -D/c alprazolam, start hydroxyzine 25 mg PO TID PRN anxiety -Start klonopin 0.5 mg BID for anxiety; can titrate dose if needed  Lumbar back pain On exam, he has midline lumbar vertebral tenderness and states that no pain medications truly alleviate his pain. He denies LE weakness or numbness but does note occasional urinary incontinence. The back pain is not new, however I do have concern given history of IVDU with last use 3 days ago. Plan: -Blood cultures now; plan for MRI lumbar spine if positive or if patient fevers -Pain management with tylenol 650 mg q6h PRN, will add oxycodone 5-10 mg q6h PRN severe pain -Post-void bladder scan  Best Practice: Diet: Regular diet IVF: None VTE: enoxaparin (LOVENOX) injection 40 mg Start: 02/02/23 1400 SCDs Start: 02/01/23 1627 Code: Full AB: Augmentin Therapy Recs: Pending DISPO: Anticipated discharge pending  medical stability .  Signature: Champ Mungo, D.O.  Internal Medicine Resident, PGY-3 [facility]   Please contact the on call pager after 5 pm and on weekends at [phone].

## 2023-02-03 NOTE — Progress Notes (Signed)
   02/02/23 2215  BiPAP/CPAP/SIPAP  $ Non-Invasive Home Ventilator  Initial  $ Face Mask Medium Yes  BiPAP/CPAP/SIPAP Pt Type Adult  BiPAP/CPAP/SIPAP V60  Mask Type Full face mask  Mask Size Medium  Respiratory Rate 20 breaths/min  IPAP 12 cmH20  EPAP 8 cmH2O  Flow Rate 3 lpm  Patient Home Equipment No  Auto Titrate No  CPAP/SIPAP surface wiped down Yes  BiPAP/CPAP /SiPAP Vitals  Pulse Rate (!) 121  Resp (!) 37  SpO2 93 %  MEWS Score/Color  MEWS Score 5  MEWS Score Color Red

## 2023-02-03 NOTE — Progress Notes (Signed)
Pt's SPO2 dropped to 66%.  Pt took off his O2 and walked to bathroom to have a BM.  Pt educated on necessity of supplemental oxygen use at ALL times.  Will cont plan of care

## 2023-02-03 NOTE — TOC Benefit Eligibility Note (Signed)
Patient Product/process development scientist completed.    The patient is insured through [employer]. Patient has [employer], may use a copay card, and/or apply for patient assistance if available.    Ran test claim for Breztri 160-9-4.8 mcg and the current 30 day co-pay is $0.00.  Ran test claim for Trelegy Ellipta 100-62.5-25 mcg and the current 30 day co-pay is $0.00.   This test claim was processed through [employer]- copay amounts may vary at other pharmacies due to pharmacy/plan contracts, or as the patient moves through the different stages of their insurance plan.     Roland Earl, CPHT Pharmacy Technician III Certified Patient Advocate [facility] Patient Advocate Team Direct Number: [phone]  Fax: [phone]

## 2023-02-03 NOTE — Progress Notes (Signed)
Pt calling for help multiple times, saying "something has to happen."  Asking for more solumedrol.  Pt very anxious, does not want to be left alone.  PRN anxiety meds have been given, as well as PRN nebs.  MDs made aware of Pts inconsolability.  Will cont plan of care as able

## 2023-02-04 ENCOUNTER — Inpatient Hospital Stay (HOSPITAL_COMMUNITY): Payer: No Typology Code available for payment source

## 2023-02-04 LAB — CBC
HCT: 51.3 % (ref 39.0–52.0)
Hemoglobin: 16.3 g/dL (ref 13.0–17.0)
MCH: 32 pg (ref 26.0–34.0)
MCHC: 31.8 g/dL (ref 30.0–36.0)
MCV: 100.8 fL — ABNORMAL HIGH (ref 80.0–100.0)
Platelets: 226 10*3/uL (ref 150–400)
RBC: 5.09 MIL/uL (ref 4.22–5.81)
RDW: 13.2 % (ref 11.5–15.5)
WBC: 14.9 10*3/uL — ABNORMAL HIGH (ref 4.0–10.5)
nRBC: 0 % (ref 0.0–0.2)

## 2023-02-04 LAB — BASIC METABOLIC PANEL
Anion gap: 8 (ref 5–15)
BUN: 15 mg/dL (ref 8–23)
CO2: 37 mmol/L — ABNORMAL HIGH (ref 22–32)
Calcium: 8.8 mg/dL — ABNORMAL LOW (ref 8.9–10.3)
Chloride: 94 mmol/L — ABNORMAL LOW (ref 98–111)
Creatinine, Ser: 0.73 mg/dL (ref 0.61–1.24)
GFR, Estimated: >60 mL/min (ref >60)
Glucose, Bld: 95 mg/dL (ref 70–99)
Potassium: 4.4 mmol/L (ref 3.5–5.1)
Sodium: 139 mmol/L (ref 135–145)

## 2023-02-04 LAB — D-DIMER, QUANTITATIVE: D-Dimer, Quant: 0.98 ug{FEU}/mL — ABNORMAL HIGH (ref 0.00–0.50)

## 2023-02-04 LAB — TROPONIN I (HIGH SENSITIVITY)
Troponin I (High Sensitivity): 24 ng/L — ABNORMAL HIGH (ref <18)
Troponin I (High Sensitivity): 20 ng/L — ABNORMAL HIGH (ref <18)

## 2023-02-04 MED ORDER — MORPHINE SULFATE (PF) 2 MG/ML IV SOLN
1.0000 mg | Freq: Once | INTRAVENOUS | Status: AC
  Administered 2023-02-04: 1 mg via INTRAVENOUS
  Filled 2023-02-04: qty 1

## 2023-02-04 MED ORDER — AZITHROMYCIN 500 MG PO TABS
500.0000 mg | ORAL_TABLET | Freq: Every day | ORAL | Status: DC
  Administered 2023-02-04 – 2023-02-05 (×2): 500 mg via ORAL
  Filled 2023-02-04 (×3): qty 1

## 2023-02-04 MED ORDER — LIDOCAINE VISCOUS HCL 2 % MT SOLN
15.0000 mL | Freq: Once | OROMUCOSAL | Status: AC
  Administered 2023-02-04: 15 mL via ORAL
  Filled 2023-02-04: qty 15

## 2023-02-04 MED ORDER — METHYLPREDNISOLONE SODIUM SUCC 40 MG IJ SOLR
INTRAMUSCULAR | Status: AC
  Administered 2023-02-04: 40 mg
  Filled 2023-02-04: qty 1

## 2023-02-04 MED ORDER — LIDOCAINE 5 % EX PTCH
2.0000 | MEDICATED_PATCH | CUTANEOUS | Status: DC
  Administered 2023-02-04 – 2023-02-10 (×7): 2 via TRANSDERMAL
  Filled 2023-02-04 (×7): qty 2

## 2023-02-04 MED ORDER — CLONAZEPAM 0.25 MG PO TBDP
0.2500 mg | ORAL_TABLET | Freq: Two times a day (BID) | ORAL | Status: DC
  Administered 2023-02-04 – 2023-02-05 (×3): 0.25 mg via ORAL
  Filled 2023-02-04 (×4): qty 1

## 2023-02-04 MED ORDER — METOPROLOL SUCCINATE ER 50 MG PO TB24
50.0000 mg | ORAL_TABLET | Freq: Every morning | ORAL | Status: DC
  Administered 2023-02-04 – 2023-02-05 (×2): 50 mg via ORAL
  Filled 2023-02-04 (×2): qty 1

## 2023-02-04 MED ORDER — NITROGLYCERIN 0.4 MG SL SUBL
SUBLINGUAL_TABLET | SUBLINGUAL | Status: AC
  Administered 2023-02-04: 0.4 mg
  Filled 2023-02-04: qty 1

## 2023-02-04 MED ORDER — ACETAMINOPHEN 325 MG PO TABS
650.0000 mg | ORAL_TABLET | Freq: Four times a day (QID) | ORAL | Status: DC
  Administered 2023-02-04 – 2023-02-05 (×5): 650 mg via ORAL
  Filled 2023-02-04 (×5): qty 2

## 2023-02-04 MED ORDER — TRAZODONE HCL 50 MG PO TABS
25.0000 mg | ORAL_TABLET | Freq: Every evening | ORAL | Status: DC | PRN
  Administered 2023-02-04: 25 mg via ORAL
  Filled 2023-02-04: qty 1

## 2023-02-04 MED ORDER — PANTOPRAZOLE SODIUM 40 MG PO TBEC
40.0000 mg | DELAYED_RELEASE_TABLET | Freq: Every day | ORAL | Status: DC
  Administered 2023-02-04 – 2023-02-05 (×2): 40 mg via ORAL
  Filled 2023-02-04 (×2): qty 1

## 2023-02-04 MED ORDER — OXYCODONE HCL 5 MG PO TABS
5.0000 mg | ORAL_TABLET | Freq: Four times a day (QID) | ORAL | Status: DC | PRN
  Administered 2023-02-05: 5 mg via ORAL
  Filled 2023-02-04: qty 1

## 2023-02-04 MED ORDER — ALUM & MAG HYDROXIDE-SIMETH 200-200-20 MG/5ML PO SUSP
30.0000 mL | Freq: Once | ORAL | Status: AC
  Administered 2023-02-04: 30 mL via ORAL
  Filled 2023-02-04: qty 30

## 2023-02-04 MED ORDER — IBUPROFEN 400 MG PO TABS: 400.0000 mg | ORAL_TABLET | Freq: Three times a day (TID) | ORAL | Status: DC

## 2023-02-04 MED ORDER — MUPIROCIN CALCIUM 2 % EX CREA
TOPICAL_CREAM | Freq: Two times a day (BID) | CUTANEOUS | Status: DC
  Administered 2023-02-05 – 2023-02-09 (×5): 1 via TOPICAL
  Filled 2023-02-04 (×2): qty 15

## 2023-02-04 MED ORDER — IBUPROFEN 400 MG PO TABS
400.0000 mg | ORAL_TABLET | Freq: Three times a day (TID) | ORAL | Status: DC
  Administered 2023-02-04 – 2023-02-05 (×3): 400 mg via ORAL
  Filled 2023-02-04 (×5): qty 1

## 2023-02-04 NOTE — Progress Notes (Signed)
HD#3 SUBJECTIVE:  Patient Summary: Francisco [name] is a 62 y.o. with a pertinent PMH of tobacco use (100 pack year), COPD, HTN, HLD, CAD s/p PCI, who presented with 4-5 days of shortness of breath and admitted for COPD exacerbation. At time of admission his only complaint was inability to catch his breath. No cough, fever, chills, sick contacts, sputum production, peripheral edema, orthopnea.   He was admitted to the ICU for acute hypoxemic and hypercapnic respiratory failure 2/2 COPD exacerbation where he was placed on BiPAP due to increased work of breathing and severe anxiety. He was started on solumedrol 40 mg daily, triple therapy nebulizer.  He has had complaints of severe back pain and anxiety has been prevalent throughout admission. He had tremors and on questioning denied EtOH use but did endorse occasional methamphetamine use.  This AM he is on 5L [place] with appropriate SpO2 levels. He did wear BiPAP overnight. Tachycardic to 110s, intermittently tachypneic. Highly anxious.  Subjective: Patient was evaluated at bedside. Endorses improvement in chest pain and shortness of breath. Pain in middle of chest and tender to palpation. Denies fevers, chills, cough, shortness of breath.   OBJECTIVE:  Vital Signs: Vitals:   02/04/23 0412 02/04/23 0600 02/04/23 0616 02/04/23 0626  BP: (!) 155/82  (!) 156/113   Pulse: (!) 114  (!) 106   Resp: 20 (!) 21 15   Temp:      TempSrc:      SpO2: 93%  96%   Weight:    81.6 kg  Height:       Supplemental O2: Nasal Cannula SpO2: 96 % O2 Flow Rate (L/min): 5 L/min FiO2 (%): 40 %  Filed Weights   02/02/23 0400 02/03/23 0500 02/04/23 0626  Weight: 83.9 kg 81.4 kg 81.6 kg     Intake/Output Summary (Last 24 hours) at 02/04/2023 0725 Last data filed at 02/04/2023 0200 Gross per 24 hour  Intake 300 ml  Output 850 ml  Net -550 ml   Net IO Since Admission: -1,150 mL [02/04/23 0725]  Physical Exam Constitutional:      General: He is not in acute  distress.    Appearance: He is well-developed. He is ill-appearing. He is not toxic-appearing.  Cardiovascular:     Rate and Rhythm: Regular rhythm. Tachycardia present.  Pulmonary:     Effort: Tachypnea present.     Breath sounds: Examination of the right-upper field reveals wheezing. Examination of the left-upper field reveals wheezing. Examination of the right-middle field reveals wheezing. Examination of the left-middle field reveals wheezing. Examination of the right-lower field reveals decreased breath sounds. Examination of the left-lower field reveals decreased breath sounds. Decreased breath sounds and wheezing present.  Musculoskeletal:     Right lower leg: No edema.     Left lower leg: No edema.  Neurological:     General: No focal deficit present.     Mental Status: He is alert.     ASSESSMENT/PLAN:  Assessment: Principal Problem:   Respiratory failure (HCC)   Plan: Acute hypoxemic and hypercapnic respiratory failure 2/2 COPD exacerbation Tobacco use disorder Patient continues to be on supplemental O2 5L [place] , goal to wean down for SpO2 88-92% on room air. On exam, he is laying in bed, in no acute respiratory distress, satting 97% on 5L [place]. Continues to have diffuse expiratory wheezes throughout his lung fields  Plan: - Continue Augmentin 875-125 mg BID day 3/5 - Continue brovana nebulizer BID, pulmicort nebulizer BID, yupelri nebulizer daily - Continue  albuterol nebulizer q6h PRN wheezing, shortness of breath, ipratropium nebulizer q6h PRN shortness of breath - Last dose of solumedrol 40 mg today; Start PO prednisone 40 mg 10/19 through 10/23 and taper to 20 mg daily from 10/24 through 10/28 -Nicotine patch -BiPAP daily at bedtime -Wean supplemental O2 as able, goal SpO2 88-92% -Needs formal PFTs and pulmonology f/u as OP  Hx combined systolic and diastolic CHF HTN CAD s/p PCI  HLD R/o ACS  R/o GERD R/o PE  Patient complained of non-radiating substernal  pressure, this morning. EKG was negative for ischemic event and Trops mildly elevated at 24. Given nitroglycerin x2 w/o much relief and given IV morphine x1 for pain control. Patient hypertensive to 180s/110s this morning. Pain improved during bedside rounds, chest pain is associated with breathing and chest tender to palpation. Less suspicious of cardiac origin, given reassuring EKG, labs and pain reproducible with palpation. Suspect possible costochondritis 2/2 to COPD exacerbation. [name] wells score is 1.5 and have low suspicion for PE, D-Dimer pending to rule out. Home regimen includes amlodipine 10 mg daily, lisinopril 20 mg daily, metoprolol 25 mg daily. Will increase metoprolol to 50 mg daily given hypertensive and tachycardic throughout the evening.  Plan: -Continue home amlodipine and lisinopril, - Increase metoprolol to 50 mg daily  -Continue home ASA 81 mg daily, atorvastatin 40 mg daily - Follow-up D-dimer, consider CTA if warranted by labs - Protonix 40 mg daily  - Maloox/Mylanta and lidocaine given x1   Anxiety Patient has been intermittently anxious throughout this hospitalization. Patient not on any medications outpatient and suspect symptoms are situational and related to hospitalization. Started Klonopin 0.5 mg BID yesterday evening. Patient more drowsy this morning on assessment and we discussed decreasing his dose, which he is amenable. Will discuss anxiety later today and consider starting an SSRI tomorrow if patient would like.  Plan: -Continue on hydroxyzine 25 mg PO TID PRN anxiety - Decrease Klonopin to 0.25mg  BID for anxiety; to decrease drowsiness  Lumbar back pain During initial transfer to the service, patient complained of midline vertebral tenderness. 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 Blood cultures with no growth x24, continue follow results throughout admission. Lower suspicion, for infectious process given  reassuring culture and patient afebrile throughout the night. Will continue to treat with schedule meds and prns.  Plan: -Blood cultures preliminary result NGx24 hours; plan for MRI lumbar spine if positive or if patient fevers -Pain management with lidocaine patches, tylenol 650 mg q6h, ibuprofen 400 mg TID, oxycodone 5 mg q6h PRN severe pain -Post-void bladder scan  Best Practice: Diet: Regular diet IVF: None VTE: enoxaparin (LOVENOX) injection 40 mg Start: 02/02/23 1400 SCDs Start: 02/01/23 1627 Code: Full AB: Augmentin Therapy Recs: Pending DISPO: Anticipated discharge pending  medical stability .  Signature  Peterson Ao , MD  Psychiatry Resident PGY-1 Pager: [phone] Please contact the on-call pager after 5 pm and on weekends at [phone].

## 2023-02-04 NOTE — Progress Notes (Addendum)
Called to bedside for chest pain. Patient reports severe substernal chest discomfort, like someone sitting on his chest. Non-radiating, constant. Somewhat worse with a cough. Hasn't been out of bed to know if it's worse with exertion. Has been present for 2 days, similar to pain he had on admission. Also concerned about back pain and headache. Wishes most of all he could take away back pain as it's the most severe pain he has. Requesting morphine for pain.  BP (!) 155/82 (BP Location: Left Arm)   Pulse (!) 114   Temp 98.6 F (37 C) (Oral)   Resp 20   Ht 6' (1.829 m)   Wt 81.4 kg   SpO2 93%   BMI 24.34 kg/m   Anxious appearing Tachycardic, regular rhythm, no apparent murmurs Tachypnea, diffuse wheezing and coarse crackles Skin is warm and clammy Alert and oriented Mood is anxious and upset  EKG reviewed, sinus tachycardia unchanged from admission EKG. Labs and previous notes reviewed.  61 year old admitted for COPD exacerbation, transferred from ICU to floor yesterday, history of CAD and now with chest pain. Apparently unchanged since admission. Troponin and EKG reassuring then. No response to nitroglycerine 0.4 mg sublingual x 2 tonight. Repeat EKG reassuring. Will get CXR and troponin. I doubt ACS. Suspect chest pain is related to pulmonary problems. I will treat with one dose of IV morphine. This person is not coping well with symptoms of his illness and has been very anxious throughout his stay here. Encouraged up and out of bed tomorrow, ordered PT/OT for him.  Marrianne Mood MD 02/04/2023, 4:40 AM

## 2023-02-04 NOTE — Plan of Care (Signed)
CHL Tonsillectomy/Adenoidectomy, Postoperative PEDS care plan entered in error.

## 2023-02-04 NOTE — Evaluation (Signed)
Physical Therapy Evaluation Patient Details Name: Francisco [name] MRN: [id number] DOB: Oct 05, 1960 Today's Date: 02/04/2023  History of Present Illness  Pt is a 62 yo male admitted 10/15 with COPD exacerbation. PMH includes CHF, COPD, CAD s/p PCI, HTN, MI, Hep-C, tobacco use, IVDA (previously heroin, methamphetamine).  Clinical Impression  Pt admitted with above diagnosis. Pt desaturates with little effort and needing 6LO2 to keep sats above 88% throughout evaluation today.  Pt with little tolerance for activity. Encouraged pursed lip breathing with pt.  Nurse agreed pt could get into chair.   Pt currently with functional limitations due to the deficits listed below (see PT Problem List). Pt will benefit from acute skilled PT to increase their independence and safety with mobility to allow discharge.           If plan is discharge home, recommend the following: Assistance with cooking/housework;Help with stairs or ramp for entrance   Can travel by private vehicle        Equipment Recommendations Rollator (4 wheels)  Recommendations for Other Services       Functional Status Assessment Patient has had a recent decline in their functional status and demonstrates the ability to make significant improvements in function in a reasonable and predictable amount of time.     Precautions / Restrictions Precautions Precautions: Fall Restrictions Weight Bearing Restrictions: No      Mobility  Bed Mobility               General bed mobility comments: Pt on EOB on arrival.    Transfers Overall transfer level: Needs assistance Equipment used: Rolling walker (2 wheels) Transfers: Sit to/from Stand, Bed to chair/wheelchair/BSC Sit to Stand: Contact guard assist Stand pivot transfers: Contact guard assist         General transfer comment: Pt was able to stand and take pivotal steps to the chair from the bed with CGA and cues.  Pt used RW for safety.  Desaturates with little effort on  6LO2 but recovered quickly once in chiar.    Ambulation/Gait                  Stairs            Wheelchair Mobility     Tilt Bed    Modified Rankin (Stroke Patients Only)       Balance                                             Pertinent Vitals/Pain Pain Assessment Pain Assessment: No/denies pain    Home Living Family/patient expects to be discharged to:: Private residence Living Arrangements: Non-relatives/Friends (rommate) Available Help at Discharge: Friend(s);Available PRN/intermittently Type of Home: Other(Comment) (condo) Home Access: Level entry   Entrance Stairs-Number of Steps: 10 3 with rail, then 1 then 2 then 3 more with rail   Home Layout: One level Home Equipment: None Additional Comments: slept in recliner pt pt and O2 had run out per pt.    Prior Function Prior Level of Function : Independent/Modified Independent             Mobility Comments: Pt reports he is Independent without an AD at baseline. ADLs Comments: Pt reprots he is Independent with ADLs and IADLs at baseline.     Extremity/Trunk Assessment   Upper Extremity Assessment Upper Extremity Assessment: Right hand dominant;RUE deficits/detail;LUE deficits/detail RUE Deficits /  Details: Strength and ROM WFL; decreased proprioception and fine and gross motor control and coordination RUE Sensation: decreased proprioception RUE Coordination: decreased fine motor;decreased gross motor LUE Deficits / Details: Strength and ROM WFL; decreased proprioception and fine and gross motor control and coordination LUE Sensation: decreased proprioception LUE Coordination: decreased fine motor;decreased gross motor    Lower Extremity Assessment Lower Extremity Assessment: Defer to PT evaluation    Cervical / Trunk Assessment Cervical / Trunk Assessment: Normal  Communication   Communication Communication: No apparent difficulties  Cognition Arousal:  Lethargic Behavior During Therapy: Flat affect Overall Cognitive Status: Within Functional Limits for tasks assessed                                          General Comments General comments (skin integrity, edema, etc.): 106 bpm, 88% on 5LO2 at rest.91% on 6LO2 at rest.  Desat with activity to 88% on 6LO2 with transfer but recovered quickly.    Exercises     Assessment/Plan    PT Assessment Patient needs continued PT services  PT Problem List Decreased activity tolerance;Decreased balance;Decreased mobility;Decreased knowledge of use of DME;Decreased safety awareness;Decreased knowledge of precautions       PT Treatment Interventions DME instruction;Gait training;Functional mobility training;Therapeutic activities;Therapeutic exercise;Balance training;Patient/family education    PT Goals (Current goals can be found in the Care Plan section)  Acute Rehab PT Goals Patient Stated Goal: to go home PT Goal Formulation: With patient Time For Goal Achievement: 02/18/23 Potential to Achieve Goals: Good    Frequency Min 1X/week     Co-evaluation               AM-PAC PT "6 Clicks" Mobility  Outcome Measure Help needed turning from your back to your side while in a flat bed without using bedrails?: None Help needed moving from lying on your back to sitting on the side of a flat bed without using bedrails?: A Little Help needed moving to and from a bed to a chair (including a wheelchair)?: A Little Help needed standing up from a chair using your arms (e.g., wheelchair or bedside chair)?: A Little Help needed to walk in hospital room?: Total Help needed climbing 3-5 steps with a railing? : Total 6 Click Score: 15    End of Session Equipment Utilized During Treatment: Gait belt;Oxygen Activity Tolerance: Patient limited by fatigue Patient left: in chair;with call bell/phone within reach;with chair alarm set Nurse Communication: Mobility status PT Visit  Diagnosis: Unsteadiness on feet (R26.81);Muscle weakness (generalized) (M62.81)    Time: 1213-1228 PT Time Calculation (min) (ACUTE ONLY): 15 min   Charges:   PT Evaluation $PT Eval Moderate Complexity: 1 Mod   PT General Charges $$ ACUTE PT VISIT: 1 Visit         Sidney Kann M,PT Acute Rehab Services [phone]   Bevelyn Buckles 02/04/2023, 2:47 PM

## 2023-02-04 NOTE — Evaluation (Signed)
Occupational Therapy Evaluation Patient Details Name: Francisco [name] MRN: [id number] DOB: 08-May-1960 Today's Date: 02/04/2023   History of Present Illness Pt is a 62 yo male admitted 10/15 with COPD exacerbation. PMH includes CHF, COPD, CAD s/p PCI, HTN, MI, Hep-C, tobacco use, IVDA (previously heroin, methamphetamine).   Clinical Impression   At baseline, pt reports he is Independent with ADLs, IADLs, and functional mobility without an AD. Pt now presents with increased lethargy, decreased activity tolerance, decreased B UE fine and gross motor coordination, decreased B UE proprioception, decreased balance during functional tasks, decreased cognition, and decreased safety and independence with functional tasks. Pt also presenting with increasing agitation with activity during OT eval limiting participation. Pt currently demonstrates ability to complete self-feeding, grooming in sitting, and UB/LB dressing in sitting/lateral leans with Set up to Supervision with increased time and cues for initiation and sequencing. Pt also demonstrates ability to complete sit<->stand transfers with RW with Contact guard assist. OT to further assess pt functional level with ADLs and functional mobility/transfers in future OT sessions. Pt will benefit from acute skilled OT services to address deficits outlined below and increase safety and independence with functional tasks. Post acute discharge, no OT follow up is indicated at this time.      If plan is discharge home, recommend the following: A little help with walking and/or transfers;A lot of help with bathing/dressing/bathroom;Assistance with cooking/housework;Direct supervision/assist for medications management;Direct supervision/assist for financial management;Assist for transportation;Help with stairs or ramp for entrance    Functional Status Assessment  Patient has had a recent decline in their functional status and demonstrates the ability to make significant  improvements in function in a reasonable and predictable amount of time.  Equipment Recommendations  None recommended by OT    Recommendations for Other Services       Precautions / Restrictions Precautions Precautions: Fall Restrictions Weight Bearing Restrictions: No      Mobility Bed Mobility               General bed mobility comments: Pt sitting in recliner at beginning and end of session.    Transfers Overall transfer level: Needs assistance Equipment used: Rolling walker (2 wheels) Transfers: Sit to/from Stand Sit to Stand: Contact guard assist           General transfer comment: Pt stood briefly to repositionin chair but declined transfer out of chair and declined functional mobility this session.      Balance Overall balance assessment: Needs assistance Sitting-balance support: No upper extremity supported, Feet supported, Single extremity supported Sitting balance-Leahy Scale: Good     Standing balance support: Bilateral upper extremity supported, During functional activity, Reliant on assistive device for balance Standing balance-Leahy Scale: Poor                             ADL either performed or assessed with clinical judgement   ADL Overall ADL's : Needs assistance/impaired Eating/Feeding: Modified independent;Sitting   Grooming: Modified independent;Sitting           Upper Body Dressing : Set up;Supervision/safety;Cueing for sequencing;Sitting (with increased time)   Lower Body Dressing: Supervision/safety;Set up;Cueing for sequencing;Sitting/lateral leans (with increased time and cue for initiation)                 General ADL Comments: Pt presents with decreased activity tolerance and increasing agitation with request to particitape in functional tasks with pt stated he "needs to rest." OT to further  assess functional level with ADLs and functional transfers/mobility in future OT session.     Vision Baseline  Vision/History: 1 Wears glasses Ability to See in Adequate Light: 0 Adequate (with glasses) Patient Visual Report: No change from baseline       Perception         Praxis         Pertinent Vitals/Pain Pain Assessment Pain Assessment: No/denies pain     Extremity/Trunk Assessment Upper Extremity Assessment Upper Extremity Assessment: Right hand dominant;RUE deficits/detail;LUE deficits/detail RUE Deficits / Details: Strength and ROM WFL; decreased proprioception and fine and gross motor control and coordination RUE Sensation: decreased proprioception RUE Coordination: decreased fine motor;decreased gross motor LUE Deficits / Details: Strength and ROM WFL; decreased proprioception and fine and gross motor control and coordination LUE Sensation: decreased proprioception LUE Coordination: decreased fine motor;decreased gross motor   Lower Extremity Assessment Lower Extremity Assessment: Defer to PT evaluation   Cervical / Trunk Assessment Cervical / Trunk Assessment: Normal   Communication Communication Communication: Difficulty communicating thoughts/reduced clarity of speech   Cognition Arousal: Lethargic Behavior During Therapy: Flat affect, Agitated, Impulsive (Initially flat affect and then showing signs of agitation with activity. Occasionally impulsive with movement.) Overall Cognitive Status: Impaired/Different from baseline (no family/caregiver present to confirm baseline) Area of Impairment: Attention, Memory, Safety/judgement, Following commands, Awareness, Problem solving                   Current Attention Level: Sustained Memory: Decreased short-term memory Following Commands: Follows one step commands consistently, Follows one step commands with increased time, Follows multi-step commands inconsistently, Follows multi-step commands with increased time Safety/Judgement: Decreased awareness of safety, Decreased awareness of deficits Awareness:  Emergent Problem Solving: Slow processing, Difficulty sequencing, Requires verbal cues       General Comments  Pt O2 sat >/89% on 6L continuous O2 throughout session all other VSS    Exercises     Shoulder Instructions      Home Living Family/patient expects to be discharged to:: Private residence Living Arrangements: Non-relatives/Friends (roommate) Available Help at Discharge: Friend(s);Available PRN/intermittently Type of Home: House (Pt reports home is "like a condo.") Home Access: Stairs to enter (Per pt's report to PT. Pt told OT there are no stairs.) Entrance Stairs-Number of Steps: 10 3 with rail, then 1 then 2 then 3 more with rail   Home Layout: One level     Bathroom Shower/Tub: Producer, television/film/video: Standard     Home Equipment: Other (comment) (home O2 per pt)   Additional Comments: slept in recliner per pt and O2 had run out per pt. Uncertain if pat is a reliable reporter as he told PT today that he lives alone and has stairs to enter his home and told OT he lives with a roommate and has a level entry.      Prior Functioning/Environment Prior Level of Function : Independent/Modified Independent             Mobility Comments: Pt reports he is Independent without an AD at baseline. Pt may be an unreliable reporter. ADLs Comments: Pt reports he is Independent with ADLs and IADLs at baseline. Pt may be an unreliable reporter.        OT Problem List: Decreased activity tolerance;Impaired balance (sitting and/or standing);Decreased coordination;Decreased safety awareness;Decreased cognition      OT Treatment/Interventions: Self-care/ADL training;DME and/or AE instruction;Therapeutic activities;Patient/family education;Balance training;Energy conservation;Therapeutic exercise    OT Goals(Current goals can be found in the care  plan section) Acute Rehab OT Goals Patient Stated Goal: to return home and be independent OT Goal Formulation: With  patient Time For Goal Achievement: 02/18/23 Potential to Achieve Goals: Good ADL Goals Pt Will Perform Grooming: with supervision;standing Pt Will Perform Upper Body Bathing: with supervision;sitting Pt Will Perform Lower Body Bathing: with supervision;sitting/lateral leans;sit to/from stand Pt Will Perform Lower Body Dressing: with modified independence;sit to/from stand;sitting/lateral leans Pt Will Transfer to Toilet: with supervision;ambulating;regular height toilet (with least restrictive AD) Pt Will Perform Toileting - Clothing Manipulation and hygiene: sit to/from stand;sitting/lateral leans;with modified independence  OT Frequency: Min 1X/week    Co-evaluation              AM-PAC OT "6 Clicks" Daily Activity     Outcome Measure Help from another person eating meals?: A Little Help from another person taking care of personal grooming?: A Little Help from another person toileting, which includes using toliet, bedpan, or urinal?: A Little Help from another person bathing (including washing, rinsing, drying)?: A Lot Help from another person to put on and taking off regular upper body clothing?: A Little Help from another person to put on and taking off regular lower body clothing?: A Little 6 Click Score: 17   End of Session Equipment Utilized During Treatment: Rolling walker (2 wheels);Oxygen Nurse Communication: Mobility status;Other (comment) (Pt with increasing agitation with activity and limited participation.)  Activity Tolerance: Patient limited by lethargy;Treatment limited secondary to agitation Patient left: in chair;with call bell/phone within reach;with chair alarm set  OT Visit Diagnosis: Other abnormalities of gait and mobility (R26.89);Other (comment) (Decreased activity tolerance)                Time: 5409-8119 OT Time Calculation (min): 15 min Charges:  OT General Charges $OT Visit: 1 Visit OT Evaluation $OT Eval Low Complexity: 1 Low  Delfin Squillace "Orson Eva.,  OTR/L, MA Acute Rehab [phone]   Lendon Colonel 02/04/2023, 5:32 PM

## 2023-02-04 NOTE — Progress Notes (Signed)
   02/04/23 0042  BiPAP/CPAP/SIPAP  BiPAP/CPAP/SIPAP Pt Type Adult  BiPAP/CPAP/SIPAP Resmed  Reason BIPAP/CPAP not in use Non-compliant (pt refused)

## 2023-02-04 NOTE — Hospital Course (Addendum)
Francisco [name] is a 62 y.o. with a pertinent PMH of tobacco use (100 pack year), COPD, HTN, HLD, CAD s/p PCI, who presented with 4-5 days of shortness of breath and admitted for COPD exacerbation. At time of admission his only complaint was inability to catch his breath. No cough, fever, chills, sick contacts, sputum production, peripheral edema, orthopnea.   Acute hypoxemic and hypercapnic respiratory failure 2/2 COPD exacerbation Tobacco use disorder Patient required admission onto the ICU service and transferred to the Internal medicine service on 10/17. At that time he needed supplemental O2 with room to wean down for goal SpO2 88-92%. On exam, during a breathing treatment and has diffuse expiratory wheezes on bilateral upper and mid lung fields with decreased breath sounds at bilateral bases. He received scheduled brovana, pulmicort, yupelri nebulizer treatments daily and with prn albuterol and Atrovent as needed. Patient continued to decompensate with respiratory status on 10/19 with worsening desaturations into the 60s requiring BiPAP.Labs were notable with critical hypercarbia of 110s. Patient was readmitted to the critical care service. He was started on precedex drip and antibiotics broadened to unasyn. IV steroids were restarted and transitioned to PO. Respiratory status continued to improve with use of scheduled nebs and stepdown to the internal medicine teaching service on 10/24. Patient was satting well on 2L on day of discharge. Patient was amenable to LTAC placement on 02/09/23. On walk test, patient desat into the 70s and recovered to the 80s on room air. Home oxygen was ordered. LTAC facility was approved and he was discharged to [facility].   He was instructed to continue scheduled nebulizier treatments at discharge. Also instructed to request pulmonology referral outpatient to follow-up PFTs.   Hx combined systolic and diastolic CHF HTN CAD s/p PCI  HLD ACS r/o OP regimen which  includes amlodipine 10 mg daily, lisinopril 20 mg daily, metoprolol 25 mg daily, ASA 81 mg daily and atorvastatin 40 mg daily. Patient continued to be hypertensive to the 180s, tachycardic and complained on 2 day old substernal chest pain. EKG was negative for ischemic events and Trops peaked at 24. CXR was negative for pneumothorax or Low suspicion for cardiac etiology given reassuring labs, EKG and reproducible chest pain with tenderness to palpation. Last blood pressure regimen was lopressor 25 mg BID and amlodipine 5 mg daily. On day of discharge, patient's blood pressure was stable. He was instructed to resume home medication regimen and discuss with primary provider at follow-up appointment.    Anxiety Hospital Delirium/Agitation Throughout hospitalization the patient, he experienced prevalent situational anxiety regarding respiratory status.  The patient was trialed on hydroxyzine, propanolol, ativan, alprazolam and klonopin to help control anxiety.  [name] intermittent agitation worsened and was also more encephalopathic on October 19.  Patient received Haldol, without much relief and continued to become aggressive with nursing staff. Onceventually transferred to the critical patient's encephalopathy was considered to be due to medications (steroid induced) and possibly due to hypercarbia.  Once in the ICU the patient was started on a Precedex drip. His agitation started to improve with Seroquel 25 mg twice daily dosing and IV haldol was used as needed. Klonopin was increased to 2 mg 3 times per day. Patient was discharged with seroquel 25 mg BID for agitation.   Patient desired home discharge, however did not have capacity to be safely discharged home. He was amenable with discharge to [facility] facility for symptomatic improvement. At time of discharge, patient was instructed to taper of klonipin gradually over a 15  day period (Klonipin 1 mg BID 5 days >> 0.5 mg BID for 5 days >> 0.25 mg BID for 5  days)     Lumbar back pain   On exam, he has midline lumbar vertebral tenderness and states that no pain medications truly alleviate his pain. He denies LE weakness or numbness but does note occasional urinary incontinence. The back pain is not new, however I do have concern given history of IVDU. Blood cultures were collected to assess for possible infectious process. Cultures were negative. Pain was controlled on multimodal pain regimen throughout admission, at times requiring oxy for severe pain. Pain was well controlled on tylenol 650 mg q6h as needed and lidocaine patches on day of discharge without complaints.

## 2023-02-04 NOTE — Progress Notes (Signed)
Patient called out for help.  Stating "I can't breathe!"  Patient had urinated on the floor.  Patient on 5L O2 with SpO2 reading 88%.  O2 increased to 6L.   PT placed patient in chair with chair alarm.  Patient stated IV is burning and painful.  Arm with a elevated knot.  Hot pack placed on arm and IV team consult placed.

## 2023-02-05 ENCOUNTER — Inpatient Hospital Stay (HOSPITAL_COMMUNITY): Payer: No Typology Code available for payment source

## 2023-02-05 DIAGNOSIS — I272 Pulmonary hypertension, unspecified: Secondary | ICD-10-CM

## 2023-02-05 DIAGNOSIS — J9601 Acute respiratory failure with hypoxia: Secondary | ICD-10-CM | POA: Diagnosis not present

## 2023-02-05 DIAGNOSIS — J9602 Acute respiratory failure with hypercapnia: Secondary | ICD-10-CM | POA: Diagnosis not present

## 2023-02-05 LAB — BLOOD GAS, ARTERIAL
Acid-Base Excess: 21.9 mmol/L — ABNORMAL HIGH (ref 0.0–2.0)
Acid-Base Excess: 22.3 mmol/L — ABNORMAL HIGH (ref 0.0–2.0)
Bicarbonate: 56.6 mmol/L — ABNORMAL HIGH (ref 20.0–28.0)
Bicarbonate: 54.3 mmol/L — ABNORMAL HIGH (ref 20.0–28.0)
Drawn by: 44137
Drawn by: 24485
O2 Saturation: 100 %
O2 Saturation: 100 %
Patient temperature: 36.6
Patient temperature: 35.8
pCO2 arterial: 113 mm[Hg] (ref 32–48)
pCO2 arterial: 98 mm[Hg] (ref 32–48)
pH, Arterial: 7.31 — ABNORMAL LOW (ref 7.35–7.45)
pH, Arterial: 7.35 (ref 7.35–7.45)
pO2, Arterial: 222 mm[Hg] — ABNORMAL HIGH (ref 83–108)
pO2, Arterial: 365 mm[Hg] — ABNORMAL HIGH (ref 83–108)

## 2023-02-05 LAB — BASIC METABOLIC PANEL
Anion gap: 13 (ref 5–15)
BUN: 17 mg/dL (ref 8–23)
CO2: 39 mmol/L — ABNORMAL HIGH (ref 22–32)
Calcium: 9.7 mg/dL (ref 8.9–10.3)
Chloride: 89 mmol/L — ABNORMAL LOW (ref 98–111)
Creatinine, Ser: 0.73 mg/dL (ref 0.61–1.24)
GFR, Estimated: >60 mL/min (ref >60)
Glucose, Bld: 160 mg/dL — ABNORMAL HIGH (ref 70–99)
Potassium: 4.9 mmol/L (ref 3.5–5.1)
Sodium: 141 mmol/L (ref 135–145)

## 2023-02-05 LAB — CBC
HCT: 53.3 % — ABNORMAL HIGH (ref 39.0–52.0)
Hemoglobin: 16.3 g/dL (ref 13.0–17.0)
MCH: 30.6 pg (ref 26.0–34.0)
MCHC: 30.6 g/dL (ref 30.0–36.0)
MCV: 100 fL (ref 80.0–100.0)
Platelets: 212 10*3/uL (ref 150–400)
RBC: 5.33 MIL/uL (ref 4.22–5.81)
RDW: 13 % (ref 11.5–15.5)
WBC: 13.1 10*3/uL — ABNORMAL HIGH (ref 4.0–10.5)
nRBC: 0 % (ref 0.0–0.2)

## 2023-02-05 LAB — BLOOD GAS, VENOUS
Acid-Base Excess: 24.2 mmol/L — ABNORMAL HIGH (ref 0.0–2.0)
Bicarbonate: 57.1 mmol/L — ABNORMAL HIGH (ref 20.0–28.0)
Drawn by: 59174
O2 Saturation: 93.2 %
Patient temperature: 36.6
pCO2, Ven: 99 mm[Hg] (ref 44–60)
pH, Ven: 7.37 (ref 7.25–7.43)
pO2, Ven: 60 mm[Hg] — ABNORMAL HIGH (ref 32–45)

## 2023-02-05 LAB — GLUCOSE, CAPILLARY: Glucose-Capillary: 125 mg/dL — ABNORMAL HIGH (ref 70–99)

## 2023-02-05 MED ORDER — DEXMEDETOMIDINE HCL IN NACL 400 MCG/100ML IV SOLN
0.0000 ug/kg/h | INTRAVENOUS | Status: DC
  Administered 2023-02-05: 0.4 ug/kg/h via INTRAVENOUS
  Filled 2023-02-05 (×2): qty 100

## 2023-02-05 MED ORDER — IPRATROPIUM-ALBUTEROL 0.5-2.5 (3) MG/3ML IN SOLN
RESPIRATORY_TRACT | Status: AC
  Filled 2023-02-05: qty 3

## 2023-02-05 MED ORDER — ALBUTEROL SULFATE (2.5 MG/3ML) 0.083% IN NEBU
INHALATION_SOLUTION | RESPIRATORY_TRACT | Status: AC
  Filled 2023-02-05: qty 6

## 2023-02-05 MED ORDER — ALBUTEROL SULFATE (2.5 MG/3ML) 0.083% IN NEBU
2.5000 mg | INHALATION_SOLUTION | RESPIRATORY_TRACT | Status: DC
  Administered 2023-02-05 (×2): 2.5 mg via RESPIRATORY_TRACT
  Filled 2023-02-05 (×2): qty 3

## 2023-02-05 MED ORDER — METHYLPREDNISOLONE SODIUM SUCC 125 MG IJ SOLR
60.0000 mg | Freq: Every day | INTRAMUSCULAR | Status: DC
  Administered 2023-02-05 – 2023-02-07 (×3): 60 mg via INTRAVENOUS
  Filled 2023-02-05 (×3): qty 2

## 2023-02-05 MED ORDER — TECHNETIUM TO 99M ALBUMIN AGGREGATED
4.1000 | Freq: Once | INTRAVENOUS | Status: AC | PRN
  Administered 2023-02-05: 4.1 via INTRAVENOUS

## 2023-02-05 MED ORDER — HALOPERIDOL 1 MG PO TABS
2.0000 mg | ORAL_TABLET | Freq: Once | ORAL | Status: AC
  Administered 2023-02-05: 2 mg via ORAL
  Filled 2023-02-05 (×2): qty 2

## 2023-02-05 MED ORDER — DEXMEDETOMIDINE HCL IN NACL 400 MCG/100ML IV SOLN
0.0000 ug/kg/h | INTRAVENOUS | Status: DC
  Administered 2023-02-05 – 2023-02-06 (×2): 0.7 ug/kg/h via INTRAVENOUS
  Administered 2023-02-06: 0.9 ug/kg/h via INTRAVENOUS
  Administered 2023-02-06: 0.7 ug/kg/h via INTRAVENOUS
  Administered 2023-02-07: 0.5 ug/kg/h via INTRAVENOUS
  Administered 2023-02-07: 1.2 ug/kg/h via INTRAVENOUS
  Administered 2023-02-07: 1.8 ug/kg/h via INTRAVENOUS
  Administered 2023-02-07: 1.5 ug/kg/h via INTRAVENOUS
  Administered 2023-02-08: 0.8 ug/kg/h via INTRAVENOUS
  Administered 2023-02-08: 0.4 ug/kg/h via INTRAVENOUS
  Filled 2023-02-05 (×11): qty 100

## 2023-02-05 MED ORDER — PREDNISONE 10 MG PO TABS: 10.0000 mg | ORAL_TABLET | Freq: Every day | ORAL | Status: DC

## 2023-02-05 MED ORDER — PROPRANOLOL HCL 10 MG PO TABS
10.0000 mg | ORAL_TABLET | Freq: Three times a day (TID) | ORAL | Status: DC | PRN
  Administered 2023-02-05: 10 mg via ORAL
  Filled 2023-02-05 (×2): qty 1

## 2023-02-05 MED ORDER — BUDESONIDE 0.5 MG/2ML IN SUSP
0.5000 mg | Freq: Two times a day (BID) | RESPIRATORY_TRACT | Status: DC
  Administered 2023-02-05 – 2023-02-11 (×12): 0.5 mg via RESPIRATORY_TRACT
  Filled 2023-02-05 (×12): qty 2

## 2023-02-05 MED ORDER — HALOPERIDOL LACTATE 5 MG/ML IJ SOLN
2.0000 mg | Freq: Four times a day (QID) | INTRAMUSCULAR | Status: DC | PRN
  Administered 2023-02-05 – 2023-02-07 (×2): 2 mg via INTRAVENOUS
  Filled 2023-02-05 (×3): qty 1

## 2023-02-05 MED ORDER — ACETAMINOPHEN 325 MG PO TABS
650.0000 mg | ORAL_TABLET | Freq: Four times a day (QID) | ORAL | Status: DC | PRN
  Administered 2023-02-06 – 2023-02-10 (×4): 650 mg via ORAL
  Filled 2023-02-05 (×5): qty 2

## 2023-02-05 MED ORDER — ALBUTEROL SULFATE (2.5 MG/3ML) 0.083% IN NEBU
2.5000 mg | INHALATION_SOLUTION | RESPIRATORY_TRACT | Status: DC | PRN
  Administered 2023-02-06: 2.5 mg via RESPIRATORY_TRACT
  Filled 2023-02-05: qty 3

## 2023-02-05 MED ORDER — ALBUTEROL (5 MG/ML) CONTINUOUS INHALATION SOLN
5.0000 mg/h | INHALATION_SOLUTION | Freq: Once | RESPIRATORY_TRACT | Status: AC
  Administered 2023-02-05: 5 mg/h via RESPIRATORY_TRACT
  Filled 2023-02-05: qty 20

## 2023-02-05 MED ORDER — IPRATROPIUM-ALBUTEROL 0.5-2.5 (3) MG/3ML IN SOLN
3.0000 mL | RESPIRATORY_TRACT | Status: DC
  Administered 2023-02-05 – 2023-02-08 (×18): 3 mL via RESPIRATORY_TRACT
  Filled 2023-02-05 (×16): qty 3

## 2023-02-05 MED ORDER — HYDROXYZINE HCL 25 MG PO TABS
25.0000 mg | ORAL_TABLET | Freq: Three times a day (TID) | ORAL | Status: DC | PRN
  Administered 2023-02-05: 25 mg via ORAL
  Filled 2023-02-05: qty 1

## 2023-02-05 MED ORDER — PREDNISONE 20 MG PO TABS
20.0000 mg | ORAL_TABLET | Freq: Every day | ORAL | Status: DC
  Administered 2023-02-05: 20 mg via ORAL
  Filled 2023-02-05: qty 1

## 2023-02-05 MED ORDER — FAMOTIDINE 20 MG PO TABS: 20.0000 mg | ORAL_TABLET | Freq: Two times a day (BID) | ORAL | Status: DC

## 2023-02-05 NOTE — Progress Notes (Signed)
Pt constantly climbing out of bed, screaming " I can't breath, help!" With 0xgygen saturation 96%. Pt constantly panicking and screaming out lout for help which require staff constantly in the room. Unable to function ADL even with using the urinal. MD paged and waiting for the call.   Lawson Radar, RN

## 2023-02-05 NOTE — Progress Notes (Signed)
Received message regarding this patient having a critical value of pCO2 of 99. Pt was on Bipap but during my evaluation he abruptly took it off and tried to get off the bed. His sats started decreasing and went into 60s and 70s. He was placed on 15 L of HFNC and that improved his saturation. Stat ABG was ordered. PCCM was consulted and promptly arrived at bedside. Given he didn't improve on bipap and his respiratory status is tenuous they planned to move him back to ICU for close monitoring. Repeat ABG resulted showing 7.37/113/222. IMTS will resume care once pt's respiratory status stabilizes.   Gwenevere Abbot, MD Eligha Bridegroom. [facility] Internal Medicine Residency, PGY-3

## 2023-02-05 NOTE — Progress Notes (Signed)
Received critical result of pco2 in ABG = 113. MD notified and aware.   Lawson Radar, RN

## 2023-02-05 NOTE — Progress Notes (Signed)
NAME:  Francisco [name], MRN:  [id number], DOB:  07-01-1960, LOS: 4 ADMISSION DATE:  02/01/2023, CONSULTATION DATE:  02/05/2023 REFERRING MD:  Inez Catalina, MD, CHIEF COMPLAINT:  respiratory failure   History of Present Illness:  Francisco [name] is a 62 y.o. man with 100 pack year smoking history who presents with COPD exacerbation. Previous 2 ppd smoker, now down to 1/2 ppd. Quit previously cold [place]. Long standing dyspnea with frequent flares requiring ED visit. Never been intubated. Breathing has worsened a lot in the last year. 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   Here with 4-5 days of shortness of breath. No cough, fevers, chills, sick contacts sputum production. No lower extremity edema or orthopnea. Just profound inability to catch his breath.   Lives at home with roommate/coworker who also smokes occasionally. Interested in quitting and wants to get consistent care for his copd.   Pertinent  Medical History  COPD HTN HLD CAD s/p PCI several years ago   Significant Hospital Events: Including procedures, antibiotic start and stop dates in addition to other pertinent events   10/15 admitted to the hospital with COPD exacerbation 10/16 improved, anxious, better with Ativan.  Ready for transfer out of the ICU. 10/19 to ICU   Interim History / Subjective:   Feels like he can't breathe Over course of day has been on/off BiPAP. Desats pretty badly to 70s when he rips off her BiPAP.  Went for a VQ scan (contrast allergy) and got haldol, was less responsive on BiPAP after this. VBG w hypercarbic, but not acidosis   Objective   Blood pressure (!) 140/109, pulse 95, temperature 97.9 F (36.6 C), temperature source Oral, resp. rate (!) 26, height 6' (1.829 m), weight 76.3 kg, SpO2 91%.    Vent Mode: BIPAP;PCV FiO2 (%):  [40 %] 40 % Set Rate:  [15 bmp-25 bmp] 15 bmp PEEP:  [8 cmH20] 8 cmH20 Pressure Support:  [10 cmH20-12 cmH20] 12 cmH20    Intake/Output Summary (Last 24 hours) at 02/05/2023 1805 Last data filed at 02/05/2023 1700 Gross per 24 hour  Intake --  Output 301 ml  Net -301 ml   Filed Weights   02/03/23 0500 02/04/23 0626 02/05/23 0340  Weight: 81.4 kg 81.6 kg 76.3 kg    Examination: General: ill appearing M appears older than stated age, frail appearing  HENT: NCAT open mouth breathing  Lungs: poor air movement  Cardiovascular: rr cap refill < 3 sec no JVD  Abdomen: soft Extremities: no edema  Neuro: Awake, anxious, following commands    Resolved Hospital Problem list     Assessment & Plan:   Acute hypoxic and hypercarbic resp failure AECOPD Tobacco use disorder -PCCM had previously outlined a steroid taper prior to transferring back to IMTS, started to have agitation on IMTS service so taper was hastened   -there was concern for PE so was sent for VQ 10/19  P -CAT now -ABG pending -transfer to ICU  - will put back on solumedrol -- going to do 60mg . Hope we can move to 40 daily tomorrow but I am not putting in these orders. Agree w previously outlined recs for taper when transition to PO appropriate: transition to oral prednisone with plan to taper 40 mg for 5 days and 20 mg for 5 days then stop -pepcid to accompany steroid course  - brovana yupelri budesonide duoneb - nicotine patch -BiPAP at bedtime and PRN -SpO2 goal 88-92 -  f/u VQ, dopplers. Has been on vte ppx this admission.   Anxiety -likely situation, also likely to be exacerbated by steroids but steroids are requisite for his AECOPD now P - has PRN atarax PRN proranolol and a very low dose PRN BZD (can incr this if needed but sounds like recently was minimally responsive after getting haldol, so am deferring any escalation of sedating meds until he further clears)  HFrEF, RV failure pHTN CAD HTN HLD -norvasc, lisinopril, metop -lipitor  -get an ECHO 10/20 -- looks like his RV dysfunction in 07/2022 was new from prior.  Consider cards cs this admission   Best Practice (right click and "Reselect all SmartList Selections" daily)   Diet/type: Regular consistency (see orders) DVT prophylaxis: prophylactic heparin  GI prophylaxis: N/A Lines: N/A Foley:  N/A Code Status:  full code Last date of multidisciplinary goals of care discussion [patient updated at bedside 10/16]  Labs   CBC: Recent Labs  Lab 01/31/23 1846 02/01/23 1342 02/01/23 1828 02/02/23 0605 02/02/23 0857 02/03/23 0928 02/04/23 1029 02/05/23 0408  WBC 11.1*   < > 12.2*  --  11.4* 14.5* 14.9* 13.1*  NEUTROABS 8.0*  --   --   --   --   --   --   --   HGB 15.7   < > 15.8 15.6 16.1 15.8 16.3 16.3  HCT 49.9   < > 49.7 46.0 50.5 49.1 51.3 53.3*  MCV 99.2   < > 96.9  --  100.4* 98.2 100.8* 100.0  PLT 276   < > 242  --  234 244 226 212   < > = values in this interval not displayed.    Basic Metabolic Panel: Recent Labs  Lab 01/31/23 1846 02/01/23 1342 02/01/23 1355 02/02/23 0605 02/02/23 0857 02/04/23 1029 02/05/23 0408  NA 140 144 142 140 141 139 141  K 3.5 3.9 3.6 4.3 4.0 4.4 4.9  CL 101 100  --   --  101 94* 89*  CO2 30 33*  --   --  32 37* 39*  GLUCOSE 111* 110*  --   --  101* 95 160*  BUN 17 16  --   --  17 15 17   CREATININE 1.02 0.99  --   --  0.85 0.73 0.73  CALCIUM 8.9 9.3  --   --  8.8* 8.8* 9.7  MG  --   --   --   --  2.3  --   --   PHOS  --   --   --   --  3.7  --   --    GFR: Estimated Creatinine Clearance: 103.3 mL/min (by C-G formula based on SCr of 0.73 mg/dL). Recent Labs  Lab 02/02/23 0857 02/03/23 0928 02/04/23 1029 02/05/23 0408  WBC 11.4* 14.5* 14.9* 13.1*    Liver Function Tests: Recent Labs  Lab 01/31/23 1846  AST 19  ALT 41  ALKPHOS 59  BILITOT 0.5  PROT 7.0  ALBUMIN 3.9   No results for input(s): "LIPASE", "AMYLASE" in the last 168 hours. No results for input(s): "AMMONIA" in the last 168 hours.  ABG    Component Value Date/Time   PHART 7.31 (L) 02/05/2023 1720   PCO2ART 113  (HH) 02/05/2023 1720   PO2ART 222 (H) 02/05/2023 1720   HCO3 56.6 (H) 02/05/2023 1720   TCO2 37 (H) 02/02/2023 0605   O2SAT 100 02/05/2023 1720     Coagulation Profile: No results for input(s): "INR", "PROTIME" in  the last 168 hours.  Cardiac Enzymes: No results for input(s): "CKTOTAL", "CKMB", "CKMBINDEX", "TROPONINI" in the last 168 hours.  HbA1C: Hgb A1c MFr Bld  Date/Time Value Ref Range Status  08/18/2019 08:38 AM 5.5 4.8 - 5.6 % Final    Comment:    RESULTS CONFIRMED BY MANUAL DILUTION (NOTE) Pre diabetes:          5.7%-6.4% Diabetes:              >6.4% Glycemic control for   <7.0% adults with diabetes   04/29/2019 03:03 AM 5.5 4.8 - 5.6 % Final    Comment:    (NOTE) Pre diabetes:          5.7%-6.4% Diabetes:              >6.4% Glycemic control for   <7.0% adults with diabetes     CBG: Recent Labs  Lab 02/01/23 1744 02/01/23 1931 02/01/23 2333  GLUCAP 135* 150* 139*   CRITICAL CARE Performed by: Lanier Clam   Total critical care time: 36 minutes  Critical care time was exclusive of separately billable procedures and treating other patients. Critical care was necessary to treat or prevent imminent or life-threatening deterioration.  Critical care was time spent personally by me on the following activities: development of treatment plan with patient and/or surrogate as well as nursing, discussions with consultants, evaluation of patient's response to treatment, examination of patient, obtaining history from patient or surrogate, ordering and performing treatments and interventions, ordering and review of laboratory studies, ordering and review of radiographic studies, pulse oximetry and re-evaluation of patient's condition.  Tessie Fass MSN, AGACNP-BC [facility] Pulmonary/Critical Care Medicine Amion for pager 02/05/2023, 6:05 PM

## 2023-02-05 NOTE — Progress Notes (Signed)
Patient transported on bipap from 4E to 2H10. Continous alb treatment continued. RN at bedside.

## 2023-02-05 NOTE — Brief Op Note (Signed)
Entered room to patient with legs out of bed, Bipap mask disconnected from face, RT attempting to redirect. Reorientation by RT and primary RN not successful, but able to reposition into bed and restore NIV. Due to aggression and agitation Precedex gtt reinitiated. Dr. Larinda Buttery instructed to increase dose to 0.8 mcg/kg/hr.

## 2023-02-05 NOTE — Progress Notes (Signed)
RT assisted with patient transport on Bipap for a VQ scan and returned to 4E21 without complications.

## 2023-02-05 NOTE — Progress Notes (Signed)
   02/04/23 2107  BiPAP/CPAP/SIPAP  Reason BIPAP/CPAP not in use Non-compliant (PT refused)

## 2023-02-05 NOTE — Progress Notes (Addendum)
eLink Physician-Brief Progress Note Patient Name: Francisco [name] DOB: 05-01-1960 MRN: [id number]   Date of Service  02/05/2023  HPI/Events of Note  62 year old with CAD, hypertension, and 100-pack-year smoking history initially presented with a COPD exacerbation after 4 to 5 days of dyspnea without associated symptoms.  He was becoming progressively more tachypneic and desaturating into the 70s off of BiPAP and brought down to the ICU for further evaluation.  On examination, he is hypertensive and saturating 95% on BiPAP at 60% FiO2.    Results show chronic metabolic alkalosis with bicarb of 39, mild leukocytosis at 13.1 and chronic hypercapnia without significant acidosis.  Radiograph fairly unremarkable.  Nuclear medicine PE study unremarkable.  eICU Interventions  Continue high-dose steroids  Scheduled SVNs, as needed SVNs  Precedex as needed, Klonopin ordered  Enoxaparin for DVT prophylaxis Maintain pantoprazole, discontinue concurrent famotidine   2219 -has numerous p.o. meds that will need to be held in the setting of respiratory failure.  May need placement of enteral access for administration of meds.  New ABG reviewed-improvement in pH and pCO2.  Maintain current settings.  2307 -on BiPAP and Precedex keeps waking up and demanding to take the mask off.  Has room and blood pressure and heart rate-continue to uptitrate Precedex as needed.  Can add Haldol as needed for agitation  Intervention Category Evaluation Type: New Patient Evaluation  [name] 02/05/2023, 7:18 PM

## 2023-02-05 NOTE — Progress Notes (Signed)
Called report to [facility] nurse.   Lawson Radar, RN

## 2023-02-05 NOTE — Progress Notes (Addendum)
HD#4 SUBJECTIVE:  Patient Summary: Francisco [name] is a 62 y.o. with a pertinent PMH of tobacco use (100 pack year), COPD, HTN, HLD, CAD s/p PCI, who presented with 4-5 days of shortness of breath and admitted for COPD exacerbation.   Subjective: Patient was evaluated at bedside.Patient stating "I can't breathe", while at bedside. Nasal canula replaced and calmed patient down with breathing techniques and saturations in the 90s. Patient able to converse with [place], more agitated today with medical providers this morning. When asked about chest pain said, "I don't know".   OBJECTIVE:  Vital Signs: Vitals:   02/04/23 2236 02/05/23 0241 02/05/23 0340 02/05/23 0905  BP: (!) 174/91 138/73  (!) 153/108  Pulse: (!) 102 66 (!) 46 99  Resp: 20 20 17 20   Temp: 99.1 F (37.3 C)   (!) 97.4 F (36.3 C)  TempSrc: Axillary   Axillary  SpO2: 95% 98% 100% 94%  Weight:   76.3 kg   Height:       Supplemental O2: Nasal Cannula SpO2: 94 % O2 Flow Rate (L/min): 4 L/min FiO2 (%): 40 %  Filed Weights   02/03/23 0500 02/04/23 0626 02/05/23 0340  Weight: 81.4 kg 81.6 kg 76.3 kg     Intake/Output Summary (Last 24 hours) at 02/05/2023 1117 Last data filed at 02/04/2023 2100 Gross per 24 hour  Intake --  Output 1 ml  Net -1 ml   Net IO Since Admission: -1,351 mL [02/05/23 1117]  Physical Exam Constitutional:      General: He is not in acute distress.    Appearance: He is well-developed. He is ill-appearing. He is not toxic-appearing.  Cardiovascular:     Rate and Rhythm: Regular rhythm. Tachycardia present.  Pulmonary:     Effort: Tachypnea present.     Breath sounds: Examination of the right-upper field reveals wheezing. Examination of the left-upper field reveals wheezing. Examination of the right-middle field reveals wheezing. Examination of the left-middle field reveals wheezing. Examination of the right-lower field reveals decreased breath sounds. Examination of the left-lower field reveals  decreased breath sounds. Decreased breath sounds and wheezing present.  Musculoskeletal:     Right lower leg: No edema.     Left lower leg: No edema.  Neurological:     General: No focal deficit present.     Mental Status: He is alert.     ASSESSMENT/PLAN:  Assessment: Principal Problem:   Respiratory failure (HCC)  Plan: Acute hypoxemic and hypercapnic respiratory failure 2/2 COPD exacerbation Tobacco use disorder Patient continues to be on supplemental O2, goal to wean down for SpO2 88-92% on room air. On exam, he is laying in the bed, when repositioned bedside reported difficulty breathing. Saturations in the 80s and returned to the 90s on 6L [place]. Later today, notified about patients worsening respiratory status, patient difficulty with breathing, Desaturations into the 70s, requiring BiPAP and nebulizer treatment with respiratory therapist assistance. O2 Sats 98 after treatment and patient stabilized. Continues to have diffuse expiratory wheezes throughout his lung fields. Patient with increased agitation yesterday evening and this morning. Possibly induced by received high dose steroids throughout admission, will decrease steroid taper today to see if any improvement with agitation.   Plan: - Continue Augmentin 875-125 mg BID daily day 4 of 5 - Continue Azithromycn 50 mg daily Day 2 of 3 - Continue brovana nebulizer BID, albuterol-ipratropium duo nebulizer q4h, pulmicort nebulizer BID, yupelri nebulizer daily - PRN albuterol 2.5 neb q4h and ipratropium nebulizer q6h PRN shortness of  breath - Start PO prednisone 20 mg 10/19 through 10/23 and taper to 10 mg daily from 10/24 through 10/28 -Nicotine patch -BiPAP as needed -Wean supplemental O2 as able, goal SpO2 88-92% -Needs formal PFTs and pulmonology f/u as OP  Anxiety R/o Delirium  Patient has been intermittently anxious throughout this hospitalization. Patient not on any medications outpatient and suspect symptoms are  situational and related to hospitalization. Patient has received high dose steroids throughout admission, will taper regimen to not worsen agitation. Propanolol 10 mg TID prn added, for anxiety attacks.Given Haldol 2 mg x1 for agitation. Will defer adding any additional benzodiazepines, at the moment to avoid respiratory depression and to assess mentation after other administered medicines.  Plan: - PO Haldol 2 mg x1 for agitation this morning   - Continue on hydroxyzine 25 mg PO TID PRN anxiety - Propanolol 10 mg TID prn for anxiety attacks   - Continue Klonopin to 0.25mg  BID for anxiety; to decrease drowsiness,  - Trazodone 25 mg at bedtime prn   Hx combined systolic and diastolic CHF HTN CAD s/p PCI  HLD R/o ACS  R/o GERD R/o PE  Patient complained of non-radiating substernal pressure, ongoing for a couple days. EKG was negative for ischemic event and Trops peaked at 24. Patient intermittently hypertensive yesterday to 180s, suspect due to anxiety and physical exertion, improved this morning 130s/70s. Less suspicious of cardiac origin, given reassuring EKG, labs and pain reproducible with palpation. Suspect possible costochondritis 2/2 to COPD exacerbation. [name] wells score is 1.5 and have low suspicion for PE, D-Dimer elevated to 0.98. Patient has IV contrast allergy and V/Q scan pending to rule out PE. Repeat CXR per nuclear medicines request. Home regimen includes amlodipine 10 mg daily, lisinopril 20 mg daily, metoprolol 25 mg daily. Continue increased metoprolol to 50 mg daily given improvements yesterday.  Plan: - Continue home amlodipine 10 mg and lisinopril 20 mg daily - Continue metoprolol to 50 mg daily  - Continue home ASA 81 mg daily, atorvastatin 40 mg daily - D-dimer 0.98\ - Repeat CXR per nuclear medicines request - V/Q scan pending rule out PE  - Protonix 40 mg daily   Lumbar back pain During initial transfer to the service, patient complained of midline vertebral  tenderness. 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 Blood cultures with no growth x 2 days, continue follow results throughout admission. Lower suspicion, for infectious process given reassuring culture and patient afebrile throughout the night. Will continue to treat with schedule meds and prns.  Plan: -Blood cultures preliminary result Ngx 2 days ; plan for MRI lumbar spine if positive or if patient fevers -Pain management with lidocaine patches, tylenol 650 mg q6h, ibuprofen 400 mg TID, oxycodone 5 mg q6h PRN severe pain -Post-void bladder scan  Best Practice: Diet: Regular diet IVF: None VTE: enoxaparin (LOVENOX) injection 40 mg Start: 02/02/23 1400 SCDs Start: 02/01/23 1627 Code: Full AB: Augmentin Therapy Recs: No PT/OT services warranted after discharge DISPO: Anticipated discharge pending  medical stability .  Signature  Peterson Ao , MD  Psychiatry Resident PGY-1 Pager: [phone] Please contact the on-call pager after 5 pm and on weekends at [phone].

## 2023-02-06 ENCOUNTER — Inpatient Hospital Stay (HOSPITAL_COMMUNITY): Payer: No Typology Code available for payment source

## 2023-02-06 ENCOUNTER — Other Ambulatory Visit (HOSPITAL_COMMUNITY): Payer: No Typology Code available for payment source

## 2023-02-06 DIAGNOSIS — J9621 Acute and chronic respiratory failure with hypoxia: Secondary | ICD-10-CM

## 2023-02-06 DIAGNOSIS — G9341 Metabolic encephalopathy: Secondary | ICD-10-CM | POA: Diagnosis not present

## 2023-02-06 DIAGNOSIS — R609 Edema, unspecified: Secondary | ICD-10-CM

## 2023-02-06 DIAGNOSIS — J9622 Acute and chronic respiratory failure with hypercapnia: Secondary | ICD-10-CM

## 2023-02-06 DIAGNOSIS — J441 Chronic obstructive pulmonary disease with (acute) exacerbation: Secondary | ICD-10-CM | POA: Diagnosis not present

## 2023-02-06 LAB — BASIC METABOLIC PANEL
Anion gap: 8 (ref 5–15)
BUN: 31 mg/dL — ABNORMAL HIGH (ref 8–23)
CO2: 32 mmol/L (ref 22–32)
Calcium: 6.3 mg/dL — CL (ref 8.9–10.3)
Chloride: 106 mmol/L (ref 98–111)
Creatinine, Ser: 1.41 mg/dL — ABNORMAL HIGH (ref 0.61–1.24)
GFR, Estimated: 56 mL/min — ABNORMAL LOW (ref >60)
Glucose, Bld: 123 mg/dL — ABNORMAL HIGH (ref 70–99)
Potassium: 3.7 mmol/L (ref 3.5–5.1)
Sodium: 146 mmol/L — ABNORMAL HIGH (ref 135–145)

## 2023-02-06 LAB — CBC
HCT: 44.4 % (ref 39.0–52.0)
Hemoglobin: 13.8 g/dL (ref 13.0–17.0)
MCH: 31.2 pg (ref 26.0–34.0)
MCHC: 31.1 g/dL (ref 30.0–36.0)
MCV: 100.5 fL — ABNORMAL HIGH (ref 80.0–100.0)
Platelets: 249 10*3/uL (ref 150–400)
RBC: 4.42 MIL/uL (ref 4.22–5.81)
RDW: 13.1 % (ref 11.5–15.5)
WBC: 13.8 10*3/uL — ABNORMAL HIGH (ref 4.0–10.5)
nRBC: 0 % (ref 0.0–0.2)

## 2023-02-06 LAB — ALBUMIN: Albumin: 2.1 g/dL — ABNORMAL LOW (ref 3.5–5.0)

## 2023-02-06 LAB — MAGNESIUM: Magnesium: 1.8 mg/dL (ref 1.7–2.4)

## 2023-02-06 LAB — PHOSPHORUS: Phosphorus: 3.4 mg/dL (ref 2.5–4.6)

## 2023-02-06 MED ORDER — PANTOPRAZOLE SODIUM 40 MG PO TBEC
40.0000 mg | DELAYED_RELEASE_TABLET | Freq: Every day | ORAL | Status: DC
  Administered 2023-02-07 – 2023-02-11 (×5): 40 mg via ORAL
  Filled 2023-02-06 (×5): qty 1

## 2023-02-06 MED ORDER — ORAL CARE MOUTH RINSE
15.0000 mL | OROMUCOSAL | Status: DC
  Administered 2023-02-06 – 2023-02-10 (×15): 15 mL via OROMUCOSAL

## 2023-02-06 MED ORDER — ORAL CARE MOUTH RINSE: 15.0000 mL | OROMUCOSAL | Status: DC | PRN

## 2023-02-06 MED ORDER — NOREPINEPHRINE 4 MG/250ML-% IV SOLN
2.0000 ug/min | INTRAVENOUS | Status: DC
  Administered 2023-02-06: 7 ug/min via INTRAVENOUS
  Administered 2023-02-06: 2 ug/min via INTRAVENOUS
  Filled 2023-02-06 (×2): qty 250

## 2023-02-06 MED ORDER — SODIUM CHLORIDE 0.9 % IV SOLN
3.0000 g | Freq: Three times a day (TID) | INTRAVENOUS | Status: AC
  Administered 2023-02-06 – 2023-02-09 (×9): 3 g via INTRAVENOUS
  Filled 2023-02-06 (×9): qty 8

## 2023-02-06 MED ORDER — SODIUM CHLORIDE 0.9 % IV SOLN
250.0000 mL | INTRAVENOUS | Status: AC
Start: 2023-02-06 — End: 2023-02-07
  Administered 2023-02-06: 250 mL via INTRAVENOUS

## 2023-02-06 MED ORDER — PANTOPRAZOLE SODIUM 40 MG IV SOLR
40.0000 mg | Freq: Every day | INTRAVENOUS | Status: DC
  Administered 2023-02-06: 40 mg via INTRAVENOUS
  Filled 2023-02-06 (×2): qty 10

## 2023-02-06 MED ORDER — SODIUM CHLORIDE 0.9 % IV SOLN
500.0000 mg | Freq: Once | INTRAVENOUS | Status: AC
  Administered 2023-02-06: 500 mg via INTRAVENOUS
  Filled 2023-02-06: qty 5

## 2023-02-06 MED ORDER — CALCIUM GLUCONATE-NACL 2-0.675 GM/100ML-% IV SOLN
2.0000 g | Freq: Once | INTRAVENOUS | Status: AC
  Administered 2023-02-06: 2000 mg via INTRAVENOUS
  Filled 2023-02-06: qty 100

## 2023-02-06 NOTE — Progress Notes (Signed)
eLink Physician-Brief Progress Note Patient Name: Francisco [name] DOB: 09/17/1960 MRN: [id number]   Date of Service  02/06/2023  HPI/Events of Note  Reporting increasing dyspnea with wheezing throughout.  Given nebs without relief.  Titrating Precedex to attempt BiPAP.  On examination he is on nasal cannula, respirations at 20.  Otherwise quite comfortable.  eICU Interventions  Continue titrating Precedex as needed.  Try to switch to BiPAP as tolerated.   0019 -updated oxygen order to reflect goal SpO2 greater than 88%  0114 -increase Precedex ceiling to 1.5 mcg.  Already has Haldol in place.  Ongoing agitation despite these measures.  0255 -despite Haldol 2 mg and Precedex, the patient is persistently encephalopathic and delirious.  Now on top of refusing his he is aggressively attacking staff members.  Given that he is not wearing his BiPAP, initiate restraints for patient and observe for safety.  Additional 5 mg Haldol now.  He is tachypneic and has increased work of breathing although does not appear to be worsening from a respiratory perspective.  Will obtain ABG.  Requesting ground team evaluation for respiratory distress-will notify the patient to evaluate.  6295 -is having a hard time expectorating his sputum, minimal output in the back of the throat.  He feels like there is something stuck in his neck.  Desaturated back to 68%.  Placed back on a nonrebreather.  Spontaneously improved saturations but will attempt nasotracheal suctioning x 1.    0636 -nonsustained VT into the 160s.  Electrolytes okay.  Continue observation   Intervention Category Intermediate Interventions: Respiratory distress - evaluation and management  Kelseigh Diver 02/06/2023, 11:14 PM

## 2023-02-06 NOTE — Progress Notes (Signed)
Lower extremity venous duplex completed. Please see CV Procedures for preliminary results.  Shona Simpson, RVT 02/06/23 11:00 AM

## 2023-02-06 NOTE — Progress Notes (Signed)
An USGPIV (ultrasound guided PIV) has been placed for short-term vasopressor infusion. A correctly placed ivWatch must be used when administering Vasopressors. Should this treatment be needed beyond 24 hours, central line access should be obtained.  It will be the responsibility of the bedside nurse to follow best practice to prevent extravasations.

## 2023-02-06 NOTE — Plan of Care (Signed)
  Problem: Clinical Measurements: Goal: Ability to maintain clinical measurements within normal limits will improve Outcome: Progressing Goal: Will remain free from infection Outcome: Progressing Goal: Diagnostic test results will improve Outcome: Progressing Goal: Respiratory complications will improve Outcome: Progressing Goal: Cardiovascular complication will be avoided Outcome: Progressing Note: Patient remains on NIV, PCO2 decreased. Sedation for NIV compliance and redirection.

## 2023-02-06 NOTE — Progress Notes (Signed)
NAME:  Francisco [name], MRN:  [id number], DOB:  1960/12/22, LOS: 5 ADMISSION DATE:  02/01/2023, CONSULTATION DATE:  02/06/2023 REFERRING MD:  Steffanie Dunn, DO, CHIEF COMPLAINT:  respiratory failure   History of Present Illness:  Francisco [name] is a 62 y.o. man with 100 pack year smoking history who presents with COPD exacerbation. Previous 2 ppd smoker, now down to 1/2 ppd. Quit previously cold [place]. Long standing dyspnea with frequent flares requiring ED visit. Never been intubated. Breathing has worsened a lot in the last year. 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   Here with 4-5 days of shortness of breath. No cough, fevers, chills, sick contacts sputum production. No lower extremity edema or orthopnea. Just profound inability to catch his breath.   Lives at home with roommate/coworker who also smokes occasionally. Interested in quitting and wants to get consistent care for his copd.   Pertinent  Medical History  COPD HTN HLD CAD s/p PCI several years ago   Significant Hospital Events: Including procedures, antibiotic start and stop dates in addition to other pertinent events   10/15 admitted to the hospital with COPD exacerbation 10/16 improved, anxious, better with Ativan.  Ready for transfer out of the ICU. 10/19 to ICU   Interim History / Subjective:  Keeps trying to self-discontinue bipap. Not following commands.   Objective   Blood pressure 97/65, pulse 69, temperature 98.1 F (36.7 C), temperature source Axillary, resp. rate 20, height 6' (1.829 m), weight 83.3 kg, SpO2 98%.    Vent Mode: PCV;BIPAP FiO2 (%):  [40 %-100 %] 40 % Set Rate:  [15 bmp-25 bmp] 15 bmp PEEP:  [6 cmH20-8 cmH20] 6 cmH20 Pressure Support:  [10 cmH20-12 cmH20] 12 cmH20   Intake/Output Summary (Last 24 hours) at 02/06/2023 1006 Last data filed at 02/06/2023 0900 Gross per 24 hour  Intake 1127.21 ml  Output 300 ml  Net 827.21 ml   Filed Weights    02/04/23 0626 02/05/23 0340 02/06/23 0615  Weight: 81.6 kg 76.3 kg 83.3 kg    Examination: General: ill appearing man lying in bed in NAD  HENT: [place]/AT, eyes anicteric Lungs: moving some air, not wheezing, comfortably on bipap when he is calm Cardiovascular: S1S2, RRR Abdomen: soft, NT Extremities: no cyanosis or edema Neuro: awake but not alert, not really interactive or redirectable. Sometimes tracks to voice on command.  Good at trying to remove his bipap mask.   Na+ 146 K+ 3.7 BUN 31 Cr 1.41 WBC 13.8 H/H 13.8/44.4 Platelets 249  Repeat ABG not docked- pH normalized, PCO2 in 80s VQ low probability for PE  Resolved Hospital Problem list     Assessment & Plan:   Acute hypoxic and hypercarbic respiratory failure AECOPD- had been on steroid taper, got worse when switched to PO and received sedation Tobacco use disorder -reepat ABG this morning -con't nebulized bronchodilators-- brovana, yupelri, pulmicort + duonebs PRN -empiric unasyn due to concern for aspiration at some point -steroids- solumedrol 60mg  IV daily -con't BiPAP until more awake -LE dopplers still pending, VQ reviewed  Acute metabolic encephalopathy due to meds, hypercapnia; despite normal pH on ABG now suggesting his baseline CO2, he's still confused Anxiety-likely situation, also likely to be exacerbated by steroids  -precedex PRN -stop trazodone -check ammonia level -PT, OT -day-night orientation -unfortunately no family has been at bedside  Chronic HFrEF- EF 45%; has been as low as 25% in 2021.  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 Denese Killings on 10/19]  Labs   CBC: Recent Labs  Lab 01/31/23 1846 02/01/23 1342 02/02/23 0857 02/03/23 0928 02/04/23 1029 02/05/23 0408 02/06/23 0501  WBC 11.1*   < > 11.4* 14.5* 14.9* 13.1* 13.8*  NEUTROABS 8.0*  --   --   --   --   --   --   HGB 15.7   < > 16.1 15.8 16.3 16.3 13.8  HCT 49.9   < > 50.5 49.1 51.3 53.3* 44.4  MCV 99.2   < > 100.4* 98.2 100.8* 100.0 100.5*  PLT 276   < > 234 244 226 212 249   < > = values in this interval not displayed.    Basic Metabolic Panel: Recent Labs  Lab 02/01/23 1342 02/01/23 1355 02/02/23 0605 02/02/23 0857 02/04/23 1029 02/05/23 0408 02/06/23 0501  NA 144   < > 140 141 139 141 146*  K 3.9   < > 4.3 4.0 4.4 4.9 3.7  CL 100  --   --  101 94* 89* 106  CO2 33*  --   --  32 37* 39* 32  GLUCOSE 110*  --   --  101* 95 160* 123*  BUN 16  --   --  17 15 17  31*  CREATININE 0.99  --   --  0.85 0.73 0.73 1.41*  CALCIUM 9.3  --   --  8.8* 8.8* 9.7 6.3*  MG  --   --   --  2.3  --   --  1.8  PHOS  --   --   --  3.7  --   --  3.4   < > = values in this interval not displayed.   GFR: Estimated Creatinine Clearance: 59.6 mL/min (A) (by C-G formula based on SCr of 1.41 mg/dL (H)). Recent Labs  Lab 02/03/23 0928 02/04/23 1029 02/05/23 0408 02/06/23 0501  WBC 14.5* 14.9* 13.1* 13.8*      This patient is critically ill with multiple organ system failure which requires frequent high complexity decision making, assessment, support, evaluation, and titration of therapies. This was completed through the application of advanced monitoring technologies and extensive interpretation of multiple databases. During this encounter critical care time was devoted to patient care services described in this note for 34 minutes.  Steffanie Dunn, DO 02/06/23 10:07  [facility]  For contact information, see Amion. If no response to pager, please call PCCM consult pager. After hours, 7PM- 7AM, please call Elink.

## 2023-02-06 NOTE — Progress Notes (Signed)
ABG results given to MD. No new orders at this time.

## 2023-02-07 ENCOUNTER — Encounter (HOSPITAL_COMMUNITY): Payer: Self-pay | Admitting: Internal Medicine

## 2023-02-07 ENCOUNTER — Inpatient Hospital Stay (HOSPITAL_COMMUNITY): Payer: No Typology Code available for payment source

## 2023-02-07 DIAGNOSIS — J9622 Acute and chronic respiratory failure with hypercapnia: Secondary | ICD-10-CM | POA: Diagnosis not present

## 2023-02-07 DIAGNOSIS — I5022 Chronic systolic (congestive) heart failure: Secondary | ICD-10-CM

## 2023-02-07 DIAGNOSIS — J9621 Acute and chronic respiratory failure with hypoxia: Secondary | ICD-10-CM | POA: Diagnosis not present

## 2023-02-07 LAB — CBC
HCT: 43 % (ref 39.0–52.0)
Hemoglobin: 13.8 g/dL (ref 13.0–17.0)
MCH: 31.2 pg (ref 26.0–34.0)
MCHC: 32.1 g/dL (ref 30.0–36.0)
MCV: 97.3 fL (ref 80.0–100.0)
Platelets: 198 10*3/uL (ref 150–400)
RBC: 4.42 MIL/uL (ref 4.22–5.81)
RDW: 12.8 % (ref 11.5–15.5)
WBC: 8.4 10*3/uL (ref 4.0–10.5)
nRBC: 0 % (ref 0.0–0.2)

## 2023-02-07 LAB — HEPATIC FUNCTION PANEL
ALT: 51 U/L — ABNORMAL HIGH (ref 0–44)
AST: 29 U/L (ref 15–41)
Albumin: 2.7 g/dL — ABNORMAL LOW (ref 3.5–5.0)
Alkaline Phosphatase: 33 U/L — ABNORMAL LOW (ref 38–126)
Bilirubin, Direct: 0.1 mg/dL (ref 0.0–0.2)
Indirect Bilirubin: 0.7 mg/dL (ref 0.3–0.9)
Total Bilirubin: 0.8 mg/dL (ref 0.3–1.2)
Total Protein: 5.4 g/dL — ABNORMAL LOW (ref 6.5–8.1)

## 2023-02-07 LAB — ECHOCARDIOGRAM COMPLETE
Area-P 1/2: 4.31 cm2
Height: 72 in
S' Lateral: 2.7 cm
Weight: 2938.29 [oz_av]

## 2023-02-07 LAB — POCT I-STAT 7, (LYTES, BLD GAS, ICA,H+H)
Acid-Base Excess: 14 mmol/L — ABNORMAL HIGH (ref 0.0–2.0)
Acid-Base Excess: 14 mmol/L — ABNORMAL HIGH (ref 0.0–2.0)
Bicarbonate: 44.8 mmol/L — ABNORMAL HIGH (ref 20.0–28.0)
Bicarbonate: 43 mmol/L — ABNORMAL HIGH (ref 20.0–28.0)
Calcium, Ion: 1.27 mmol/L (ref 1.15–1.40)
Calcium, Ion: 1.22 mmol/L (ref 1.15–1.40)
HCT: 46 % (ref 39.0–52.0)
HCT: 44 % (ref 39.0–52.0)
Hemoglobin: 15.6 g/dL (ref 13.0–17.0)
Hemoglobin: 15 g/dL (ref 13.0–17.0)
O2 Saturation: 98 %
O2 Saturation: 98 %
Patient temperature: 97
Patient temperature: 98.6
Potassium: 4.6 mmol/L (ref 3.5–5.1)
Potassium: 4.4 mmol/L (ref 3.5–5.1)
Sodium: 138 mmol/L (ref 135–145)
Sodium: 138 mmol/L (ref 135–145)
TCO2: 47 mmol/L — ABNORMAL HIGH (ref 22–32)
TCO2: 45 mmol/L — ABNORMAL HIGH (ref 22–32)
pCO2 arterial: 78.3 mm[Hg] (ref 32–48)
pCO2 arterial: 74.9 mm[Hg] (ref 32–48)
pH, Arterial: 7.361 (ref 7.35–7.45)
pH, Arterial: 7.367 (ref 7.35–7.45)
pO2, Arterial: 111 mm[Hg] — ABNORMAL HIGH (ref 83–108)
pO2, Arterial: 111 mm[Hg] — ABNORMAL HIGH (ref 83–108)

## 2023-02-07 LAB — BASIC METABOLIC PANEL
Anion gap: 8 (ref 5–15)
BUN: 45 mg/dL — ABNORMAL HIGH (ref 8–23)
CO2: 40 mmol/L — ABNORMAL HIGH (ref 22–32)
Calcium: 9.2 mg/dL (ref 8.9–10.3)
Chloride: 92 mmol/L — ABNORMAL LOW (ref 98–111)
Creatinine, Ser: 1.16 mg/dL (ref 0.61–1.24)
GFR, Estimated: >60 mL/min (ref >60)
Glucose, Bld: 128 mg/dL — ABNORMAL HIGH (ref 70–99)
Potassium: 4.6 mmol/L (ref 3.5–5.1)
Sodium: 140 mmol/L (ref 135–145)

## 2023-02-07 LAB — AMMONIA: Ammonia: 27 umol/L (ref 9–35)

## 2023-02-07 LAB — PHOSPHORUS: Phosphorus: 3.7 mg/dL (ref 2.5–4.6)

## 2023-02-07 LAB — MAGNESIUM: Magnesium: 2.2 mg/dL (ref 1.7–2.4)

## 2023-02-07 MED ORDER — PHENOL 1.4 % MT LIQD: 1.0000 | OROMUCOSAL | Status: DC | PRN

## 2023-02-07 MED ORDER — HALOPERIDOL LACTATE 5 MG/ML IJ SOLN
5.0000 mg | Freq: Four times a day (QID) | INTRAMUSCULAR | Status: AC | PRN
Start: 2023-02-07 — End: 2023-02-07
  Administered 2023-02-07: 5 mg via INTRAVENOUS

## 2023-02-07 MED ORDER — LORAZEPAM 2 MG/ML IJ SOLN
1.0000 mg | INTRAMUSCULAR | Status: DC | PRN
  Administered 2023-02-07 – 2023-02-10 (×7): 2 mg via INTRAVENOUS
  Filled 2023-02-07 (×7): qty 1

## 2023-02-07 MED ORDER — CHLORDIAZEPOXIDE HCL 5 MG PO CAPS
5.0000 mg | ORAL_CAPSULE | Freq: Three times a day (TID) | ORAL | Status: DC
  Administered 2023-02-07 (×2): 5 mg via ORAL
  Filled 2023-02-07 (×2): qty 1

## 2023-02-07 MED ORDER — HALOPERIDOL LACTATE 5 MG/ML IJ SOLN: 5.0000 mg | Freq: Once | INTRAMUSCULAR | Status: AC

## 2023-02-07 MED ORDER — LORAZEPAM 2 MG/ML IJ SOLN
2.0000 mg | Freq: Once | INTRAMUSCULAR | Status: AC
  Administered 2023-02-07: 2 mg via INTRAVENOUS
  Filled 2023-02-07: qty 1

## 2023-02-07 NOTE — Progress Notes (Signed)
NAME:  Francisco [name], MRN:  [id number], DOB:  10/10/1960, LOS: 6 ADMISSION DATE:  02/01/2023, CONSULTATION DATE:  02/07/2023 REFERRING MD:  Josephine Igo, DO, CHIEF COMPLAINT:  respiratory failure   History of Present Illness:  Francisco [name] is a 62 y.o. man with 100 pack year smoking history who presents with COPD exacerbation. Previous 2 ppd smoker, now down to 1/2 ppd. Quit previously cold [place]. Long standing dyspnea with frequent flares requiring ED visit. Never been intubated. Breathing has worsened a lot in the last year. 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   Here with 4-5 days of shortness of breath. No cough, fevers, chills, sick contacts sputum production. No lower extremity edema or orthopnea. Just profound inability to catch his breath.   Lives at home with roommate/coworker who also smokes occasionally. Interested in quitting and wants to get consistent care for his copd.   Pertinent  Medical History  COPD HTN HLD CAD s/p PCI several years ago   Significant Hospital Events: Including procedures, antibiotic start and stop dates in addition to other pertinent events   10/15 admitted to the hospital with COPD exacerbation 10/16 improved, anxious, better with Ativan.  Ready for transfer out of the ICU. 10/19 to ICU   Interim History / Subjective:   Seen this morning sitting up on side of bed anxious.  Definitely having trouble breathing tachypneic shallow respirations.  Intolerant of BiPAP.  Feels like he is smothering.  Objective   Blood pressure 98/67, pulse 62, temperature (!) 96.5 F (35.8 C), temperature source Axillary, resp. rate 17, height 6' (1.829 m), weight 83.3 kg, SpO2 99%.    FiO2 (%):  [40 %-100 %] 40 % PEEP:  [6 cmH20] 6 cmH20 Pressure Support:  [6 cmH20] 6 cmH20   Intake/Output Summary (Last 24 hours) at 02/07/2023 1120 Last data filed at 02/07/2023 1034 Gross per 24 hour  Intake 2152.25 ml  Output 2225  ml  Net -72.75 ml   Filed Weights   02/04/23 0626 02/05/23 0340 02/06/23 0615  Weight: 81.6 kg 76.3 kg 83.3 kg    Examination: General: Elderly gentleman sitting up on side of bed anxious tachypneic HENT: NCAT tracking appropriately Lungs: Minimal air movement Cardiovascular: Regular rate rhythm, S1-S2 Abdomen: Soft, nontender nondistended Extremities: No cyanosis no edema Neuro: Alert oriented following commands  Repeat ABG not docked- pH normalized, PCO2 in 80s VQ low probability for PE  Resolved Hospital Problem list     Assessment & Plan:   Acute hypoxic and hypercarbic respiratory failure AECOPD- had been on steroid taper, got worse when switched to PO and received sedation Tobacco use disorder Plan: Continue scheduled bronchodilators, Brovana Yupelri Pulmicort as needed albuterol Empiric Unasyn Continue steroids He needs to use his BiPAP.  But Ragona have to continue him on Precedex and/or as needed Ativan to help control his anxiety while riding BiPAP.  Acute metabolic encephalopathy due to meds, hypercapnia; despite normal pH on ABG now suggesting his baseline CO2, he's still confused Anxiety-likely situation, also likely to be exacerbated by steroids  Plan: Continue Precedex plus Ativan if needed PT OT Importance of reorientation.  Chronic HFrEF- EF 45%; has been as low as 25% in 2021.  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  Labs   CBC: Recent Labs  Lab 01/31/23 1846 02/01/23 1342 02/03/23 0928  02/04/23 1029 02/05/23 0408 02/06/23 0501 02/06/23 0819 02/07/23 0304 02/07/23 0355  WBC 11.1*   < > 14.5* 14.9* 13.1* 13.8*  --   --  8.4  NEUTROABS 8.0*  --   --   --   --   --   --   --   --   HGB 15.7   < > 15.8 16.3 16.3 13.8 15.6 15.0 13.8  HCT 49.9   < > 49.1 51.3 53.3* 44.4 46.0 44.0 43.0  MCV 99.2   < > 98.2 100.8* 100.0 100.5*  --   --  97.3  PLT 276   < > 244 226 212 249  --   --  198   < > = values in this interval not displayed.    Basic Metabolic Panel: Recent Labs  Lab 02/02/23 0857 02/04/23 1029 02/05/23 0408 02/06/23 0501 02/06/23 0819 02/07/23 0304 02/07/23 0355  NA 141 139 141 146* 138 138 140  K 4.0 4.4 4.9 3.7 4.6 4.4 4.6  CL 101 94* 89* 106  --   --  92*  CO2 32 37* 39* 32  --   --  40*  GLUCOSE 101* 95 160* 123*  --   --  128*  BUN 17 15 17  31*  --   --  45*  CREATININE 0.85 0.73 0.73 1.41*  --   --  1.16  CALCIUM 8.8* 8.8* 9.7 6.3*  --   --  9.2  MG 2.3  --   --  1.8  --   --  2.2  PHOS 3.7  --   --  3.4  --   --  3.7   GFR: Estimated Creatinine Clearance: 72.5 mL/min (by C-G formula based on SCr of 1.16 mg/dL). Recent Labs  Lab 02/04/23 1029 02/05/23 0408 02/06/23 0501 02/07/23 0355  WBC 14.9* 13.1* 13.8* 8.4    This patient is critically ill with multiple organ system failure; which, requires frequent high complexity decision making, assessment, support, evaluation, and titration of therapies. This was completed through the application of advanced monitoring technologies and extensive interpretation of multiple databases. During this encounter critical care time was devoted to patient care services described in this note for 32 minutes.  Josephine Igo, DO  Pulmonary Critical Care 02/07/2023 11:20 AM

## 2023-02-07 NOTE — Progress Notes (Signed)
RT x2 attempted to place patient on bipap without success. Patient did not tolerate. RN at bedside.

## 2023-02-07 NOTE — Progress Notes (Signed)
Attempted Echocardiogram, per RN try back at a later time until patient is more calm. Will attempt again later.

## 2023-02-07 NOTE — Progress Notes (Signed)
Echocardiogram 2D Echocardiogram has been performed.  Lucendia Herrlich 02/07/2023, 11:51 AM

## 2023-02-07 NOTE — Progress Notes (Signed)
PT Cancellation Note  Patient Details Name: Francisco [name] MRN: [id number] DOB: 08/13/1960   Cancelled Treatment:    Reason Eval/Treat Not Completed: Other (comment). Pt received ativan for anxiety and is extremely lethargic and unable to participate in therapy. Attempted at 10am and 12:41pm. Acute PT to return as able to progress mobility.  Lewis Shock, PT, DPT Acute Rehabilitation Services Secure chat preferred Office #: [phone]    Iona Hansen 02/07/2023, 12:40 PM

## 2023-02-08 DIAGNOSIS — J441 Chronic obstructive pulmonary disease with (acute) exacerbation: Secondary | ICD-10-CM | POA: Diagnosis not present

## 2023-02-08 LAB — BASIC METABOLIC PANEL
Anion gap: 9 (ref 5–15)
BUN: 29 mg/dL — ABNORMAL HIGH (ref 8–23)
CO2: 36 mmol/L — ABNORMAL HIGH (ref 22–32)
Calcium: 9.2 mg/dL (ref 8.9–10.3)
Chloride: 95 mmol/L — ABNORMAL LOW (ref 98–111)
Creatinine, Ser: 0.61 mg/dL (ref 0.61–1.24)
GFR, Estimated: >60 mL/min (ref >60)
Glucose, Bld: 86 mg/dL (ref 70–99)
Potassium: 4.8 mmol/L (ref 3.5–5.1)
Sodium: 140 mmol/L (ref 135–145)

## 2023-02-08 LAB — CBC
HCT: 43.2 % (ref 39.0–52.0)
Hemoglobin: 14.2 g/dL (ref 13.0–17.0)
MCH: 31.3 pg (ref 26.0–34.0)
MCHC: 32.9 g/dL (ref 30.0–36.0)
MCV: 95.2 fL (ref 80.0–100.0)
Platelets: 200 10*3/uL (ref 150–400)
RBC: 4.54 MIL/uL (ref 4.22–5.81)
RDW: 12.9 % (ref 11.5–15.5)
WBC: 8 10*3/uL (ref 4.0–10.5)
nRBC: 0 % (ref 0.0–0.2)

## 2023-02-08 LAB — CULTURE, BLOOD (ROUTINE X 2)
Culture: NO GROWTH
Culture: NO GROWTH

## 2023-02-08 MED ORDER — CLONAZEPAM 1 MG PO TABS
1.0000 mg | ORAL_TABLET | Freq: Two times a day (BID) | ORAL | Status: DC
  Administered 2023-02-08 (×2): 1 mg via ORAL
  Filled 2023-02-08 (×2): qty 1

## 2023-02-08 MED ORDER — QUETIAPINE FUMARATE 25 MG PO TABS
25.0000 mg | ORAL_TABLET | Freq: Two times a day (BID) | ORAL | Status: DC
  Administered 2023-02-08 – 2023-02-11 (×7): 25 mg via ORAL
  Filled 2023-02-08 (×7): qty 1

## 2023-02-08 MED ORDER — METOPROLOL TARTRATE 25 MG PO TABS
25.0000 mg | ORAL_TABLET | Freq: Two times a day (BID) | ORAL | Status: DC
  Administered 2023-02-08 – 2023-02-11 (×7): 25 mg via ORAL
  Filled 2023-02-08 (×7): qty 1

## 2023-02-08 MED ORDER — THIAMINE HCL 100 MG/ML IJ SOLN
500.0000 mg | Freq: Three times a day (TID) | INTRAVENOUS | Status: AC
  Administered 2023-02-08 – 2023-02-09 (×6): 500 mg via INTRAVENOUS
  Filled 2023-02-08 (×6): qty 5

## 2023-02-08 MED ORDER — PREDNISONE 20 MG PO TABS
40.0000 mg | ORAL_TABLET | Freq: Every day | ORAL | Status: DC
  Administered 2023-02-08 – 2023-02-11 (×4): 40 mg via ORAL
  Filled 2023-02-08 (×4): qty 2

## 2023-02-08 MED ORDER — THIAMINE HCL 100 MG/ML IJ SOLN
100.0000 mg | INTRAMUSCULAR | Status: DC
  Administered 2023-02-10 – 2023-02-11 (×2): 100 mg via INTRAVENOUS
  Filled 2023-02-08 (×2): qty 2

## 2023-02-08 NOTE — Progress Notes (Signed)
PT Cancellation Note  Patient Details Name: Francisco [name] MRN: [id number] DOB: 09-Sep-1960   Cancelled Treatment:    Reason Eval/Treat Not Completed: Other (comment). Pt continues to have high anxiety regarding difficulty breathing requiring sedative medicines. Pt also with tenuous respiratory status today. Acute PT to return as able, as appropriate to progress mobility.  Lewis Shock, PT, DPT Acute Rehabilitation Services Secure chat preferred Office #: [phone]    Iona Hansen 02/08/2023, 11:33 AM

## 2023-02-08 NOTE — Progress Notes (Signed)
NAME:  Francisco [name], MRN:  [id number], DOB:  02/06/1961, LOS: 7 ADMISSION DATE:  02/01/2023, CONSULTATION DATE:  02/08/2023 REFERRING MD:  Lorin Glass, MD, CHIEF COMPLAINT:  respiratory failure   History of Present Illness:  Francisco [name] is a 62 y.o. man with 100 pack year smoking history who presents with COPD exacerbation. Previous 2 ppd smoker, now down to 1/2 ppd. Quit previously cold [place]. Long standing dyspnea with frequent flares requiring ED visit. Never been intubated. Breathing has worsened a lot in the last year. 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   Here with 4-5 days of shortness of breath. No cough, fevers, chills, sick contacts sputum production. No lower extremity edema or orthopnea. Just profound inability to catch his breath.   Lives at home with roommate/coworker who also smokes occasionally. Interested in quitting and wants to get consistent care for his copd.   Pertinent  Medical History  COPD HTN HLD CAD s/p PCI several years ago   Significant Hospital Events: Including procedures, antibiotic start and stop dates in addition to other pertinent events   10/15 admitted to the hospital with COPD exacerbation 10/16 improved, anxious, better with Ativan.  Ready for transfer out of the ICU. 10/19 to ICU   Interim History / Subjective:  Refused bipap. Breathing improved.  Objective   Blood pressure 128/86, pulse 78, temperature (!) 97.4 F (36.3 C), temperature source Axillary, resp. rate 14, height 6' (1.829 m), weight 83.3 kg, SpO2 100%.        Intake/Output Summary (Last 24 hours) at 02/08/2023 0723 Last data filed at 02/08/2023 0700 Gross per 24 hour  Intake 528.61 ml  Output 2190 ml  Net -1661.39 ml   Filed Weights   02/04/23 0626 02/05/23 0340 02/06/23 0615  Weight: 81.6 kg 76.3 kg 83.3 kg    Examination: No distress Comfortable breathing pattern Diminished breath sounds bilaterally Ext warm AO  to self only Moves to command Abd soft Neurologically nonfocal  Ammonia ok No labs   Resolved Hospital Problem list     Assessment & Plan:   Acute hypoxic and hypercarbic respiratory failure AECOPD- had been on steroid taper, got worse when switched to PO and received sedation Tobacco use disorder Plan: Continue scheduled bronchodilators, Brovana Yupelri Pulmicort as needed albuterol Empiric Unasyn (day 3/3) Continue steroids: to PO x 5 days He needs to use his BiPAP.  But Ragona have to continue him on Precedex and/or as needed Ativan to help control his anxiety while riding BiPAP.  Acute metabolic encephalopathy due to meds, hypercapnia; despite normal pH on ABG now suggesting his baseline CO2, he's still confused Acute on chronic anxiety-likely situation, also likely to be exacerbated by steroids  Plan: Start seroquel and klonipin (dc librium) Wean off precedex Reorient, day/night cycles Trial of high dose thiamine (although no real drinking hx)  Chronic HFrEF- EF 45%; has been as low as 25% in 2021.  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

## 2023-02-08 NOTE — Plan of Care (Signed)
  Problem: Education: Goal: Knowledge of General Education information will improve Description: Including pain rating scale, medication(s)/side effects and non-pharmacologic comfort measures Outcome: Progressing   Problem: Pain Managment: Goal: General experience of comfort will improve Outcome: Progressing   Problem: Safety: Goal: Ability to remain free from injury will improve Outcome: Progressing   

## 2023-02-08 NOTE — Progress Notes (Signed)
OT Cancellation Note  Patient Details Name: Rosio Sterner MRN: [id number] DOB: [birth year]   Cancelled Treatment:    Reason Eval/Treat Not Completed: Other (comment). Continued anxiety regarding difficulty breathing reuiring sedative medications; tenuous respiratory status today. Acute OT to continue to follow as pt medically ready.   Tyler Deis, OTR/L [facility] Acute Rehabilitation Office: [phone]   Myrla Halsted 02/08/2023, 1:40 PM

## 2023-02-09 ENCOUNTER — Inpatient Hospital Stay (HOSPITAL_COMMUNITY): Payer: No Typology Code available for payment source

## 2023-02-09 DIAGNOSIS — J9621 Acute and chronic respiratory failure with hypoxia: Secondary | ICD-10-CM | POA: Diagnosis not present

## 2023-02-09 DIAGNOSIS — J9622 Acute and chronic respiratory failure with hypercapnia: Secondary | ICD-10-CM | POA: Diagnosis not present

## 2023-02-09 LAB — BASIC METABOLIC PANEL
Anion gap: 6 (ref 5–15)
BUN: 25 mg/dL — ABNORMAL HIGH (ref 8–23)
CO2: 39 mmol/L — ABNORMAL HIGH (ref 22–32)
Calcium: 9.1 mg/dL (ref 8.9–10.3)
Chloride: 97 mmol/L — ABNORMAL LOW (ref 98–111)
Creatinine, Ser: 0.63 mg/dL (ref 0.61–1.24)
GFR, Estimated: >60 mL/min (ref >60)
Glucose, Bld: 98 mg/dL (ref 70–99)
Potassium: 5 mmol/L (ref 3.5–5.1)
Sodium: 142 mmol/L (ref 135–145)

## 2023-02-09 LAB — CBC
HCT: 43.4 % (ref 39.0–52.0)
Hemoglobin: 13.5 g/dL (ref 13.0–17.0)
MCH: 30.5 pg (ref 26.0–34.0)
MCHC: 31.1 g/dL (ref 30.0–36.0)
MCV: 98.2 fL (ref 80.0–100.0)
Platelets: 193 10*3/uL (ref 150–400)
RBC: 4.42 MIL/uL (ref 4.22–5.81)
RDW: 12.8 % (ref 11.5–15.5)
WBC: 7.7 10*3/uL (ref 4.0–10.5)
nRBC: 0 % (ref 0.0–0.2)

## 2023-02-09 LAB — MAGNESIUM: Magnesium: 2.2 mg/dL (ref 1.7–2.4)

## 2023-02-09 MED ORDER — HALOPERIDOL LACTATE 5 MG/ML IJ SOLN
5.0000 mg | Freq: Four times a day (QID) | INTRAMUSCULAR | Status: DC | PRN
  Administered 2023-02-09 – 2023-02-11 (×4): 5 mg via INTRAVENOUS
  Filled 2023-02-09 (×3): qty 1

## 2023-02-09 MED ORDER — CLONAZEPAM 0.5 MG PO TABS
2.0000 mg | ORAL_TABLET | Freq: Three times a day (TID) | ORAL | Status: DC
  Administered 2023-02-09 – 2023-02-11 (×7): 2 mg via ORAL
  Filled 2023-02-09 (×2): qty 2
  Filled 2023-02-09: qty 4
  Filled 2023-02-09 (×4): qty 2

## 2023-02-09 MED ORDER — HALOPERIDOL LACTATE 5 MG/ML IJ SOLN
INTRAMUSCULAR | Status: AC
  Filled 2023-02-09: qty 1

## 2023-02-09 NOTE — Progress Notes (Signed)
NAME:  Francisco [name], MRN:  [id number], DOB:  Sep 24, 1960, LOS: 8 ADMISSION DATE:  02/01/2023, CONSULTATION DATE:  02/09/2023 REFERRING MD:  Josephine Igo, DO, CHIEF COMPLAINT:  respiratory failure   History of Present Illness:  Francisco [name] is a 62 y.o. man with 100 pack year smoking history who presents with COPD exacerbation. Previous 2 ppd smoker, now down to 1/2 ppd. Quit previously cold [place]. Long standing dyspnea with frequent flares requiring ED visit. Never been intubated. Breathing has worsened a lot in the last year. 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   Here with 4-5 days of shortness of breath. No cough, fevers, chills, sick contacts sputum production. No lower extremity edema or orthopnea. Just profound inability to catch his breath.   Lives at home with roommate/coworker who also smokes occasionally. Interested in quitting and wants to get consistent care for his copd.   Pertinent  Medical History  COPD HTN HLD CAD s/p PCI several years ago   Significant Hospital Events: Including procedures, antibiotic start and stop dates in addition to other pertinent events   10/15 admitted to the hospital with COPD exacerbation 10/16 improved, anxious, better with Ativan.  Ready for transfer out of the ICU. 10/19 to ICU  10/23 still anxious, but much improved   Interim History / Subjective:   Still with labored breathing does not like using BiPAP.  Objective   Blood pressure 121/80, pulse 73, temperature 97.6 F (36.4 C), temperature source Oral, resp. rate 20, height 6' (1.829 m), weight 78.5 kg, SpO2 95%.        Intake/Output Summary (Last 24 hours) at 02/09/2023 0858 Last data filed at 02/09/2023 0800 Gross per 24 hour  Intake 1145.69 ml  Output 2625 ml  Net -1479.31 ml   Filed Weights   02/05/23 0340 02/06/23 0615 02/09/23 0500  Weight: 76.3 kg 83.3 kg 78.5 kg    Examination: General: Elderly gentleman chronically  ill-appearing HEENT: Thin, muscle wasting supra clavicular space Heart: Tachycardic irregular Lungs: Diminished breath sounds, poor intake, prolonged expiratory phase Abdomen: Soft nontender Neuro Alert oriented following commands no deficit   Resolved Hospital Problem list     Assessment & Plan:   Acute hypoxic and hypercarbic respiratory failure AECOPD- had been on steroid taper, got worse when switched to PO and received sedation Tobacco use disorder Plan: Continue scheduled bronchodilators Brovana Yupelri Pulmicort and as needed albuterol Completed course of Unasyn x 5 days BiPAP as needed and at night Benzodiazepines to help with anxiety with use of BiPAP.  Acute metabolic encephalopathy due to meds, hypercapnia; despite normal pH on ABG now suggesting his baseline CO2, he's still confused Acute on chronic anxiety-likely situation, also likely to be exacerbated by steroids  Plan: Continue Seroquel plus Klonopin Discontinued Precedex no longer needing Given high-dose thiamine, unclear alcohol use history  Chronic HFrEF- EF 45%; has been as low as 25% in 2021.  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

## 2023-02-09 NOTE — Progress Notes (Signed)
OT Cancellation Note  Patient Details Name: Francisco [name] MRN: [id number] DOB: 08/11/1960   Cancelled Treatment:    Reason Eval/Treat Not Completed: Other (comment) (just given ativan ~30 mins ago secondary to agitation; lethargic and RN reporting likely not appropriate. Did enter room with pt calling for help as he had doffed his O2 via [place]; re-donned and cued for pursed lip breathing through nose. SpO2 back up to 90)  Myrla Halsted, OTD, OTR/L [facility] Acute Rehabilitation Office: [phone]   Myrla Halsted 02/09/2023, 2:54 PM

## 2023-02-09 NOTE — Progress Notes (Signed)
Physical Therapy Treatment Patient Details Name: Francisco [name] MRN: [id number] DOB: 1960-08-04 Today's Date: 02/09/2023   History of Present Illness Pt is a 62 yo male admitted 10/15 with COPD exacerbation. PMH includes CHF, COPD, CAD s/p PCI, HTN, MI, Hep-C, tobacco use, IVDA (previously heroin, methamphetamine).    PT Comments  Per RN patient attempted to leave AMA earlier today and received ativan. Pt lethargic but able to follow commands with constant verbal cues to stay on task. Pt with impaired balance, impaired bilat hand dexterity as noted during patient donning socks and unable to hold onto them, generalized weakness, noted wheezing and SOB with activity, and requires RW for safe ambulation at this time. PTA pt was working, indep and not on O2. Aware pt is non-compliant with his medical care however at this time recommending inpatient rehab program < 3 hrs/day to maximize indep function for safe return home with roommates. Acute PT to cont to follow.    If plan is discharge home, recommend the following: Assistance with cooking/housework;Help with stairs or ramp for entrance;A little help with walking and/or transfers;A little help with bathing/dressing/bathroom   Can travel by private vehicle     No  Equipment Recommendations  Rollator (4 wheels)    Recommendations for Other Services       Precautions / Restrictions Precautions Precautions: Fall Restrictions Weight Bearing Restrictions: No     Mobility  Bed Mobility Overal bed mobility: Needs Assistance Bed Mobility: Supine to Sit, Sit to Supine     Supine to sit: Min assist Sit to supine: Min assist   General bed mobility comments: minA for safety and lines due to impulsivity and generalized weakness    Transfers Overall transfer level: Needs assistance Equipment used: None Transfers: Sit to/from Stand Sit to Stand: Mod assist           General transfer comment: pt with increased trunk flexion, wide base of  support, unsteady    Ambulation/Gait Ambulation/Gait assistance: Min assist, Mod assist Gait Distance (Feet): 160 Feet Assistive device: Rolling walker (2 wheels) Gait Pattern/deviations: Step-through pattern, Decreased stride length, Trunk flexed, Narrow base of support Gait velocity: dec Gait velocity interpretation: <1.31 ft/sec, indicative of household ambulator   General Gait Details: pt attempted to ambulate without AD and ended up attempting to support self on this PT's shoulder, pt given RW s/p 10 feet. Pt significantly more steady with RW however very UE dependent with noted vearing to the R. Pt unable to keep head up and look straight, noted wheezing t/o, SpO2 >84% on 4Lo2 via [place] t/o session, pt with 2 standing rest breaks   Stairs             Wheelchair Mobility     Tilt Bed    Modified Rankin (Stroke Patients Only)       Balance Overall balance assessment: Needs assistance Sitting-balance support: No upper extremity supported, Feet supported, Single extremity supported Sitting balance-Leahy Scale: Fair Sitting balance - Comments: pt with increased trunk flexion, leans to the R   Standing balance support: Bilateral upper extremity supported, During functional activity, Reliant on assistive device for balance Standing balance-Leahy Scale: Poor Standing balance comment: reliant on external support                            Cognition Arousal: Lethargic Behavior During Therapy: Flat affect, Impulsive Overall Cognitive Status: Impaired/Different from baseline (no family/caregiver present to confirm baseline) Area of Impairment: Attention,  Memory, Safety/judgement, Following commands, Awareness, Problem solving                   Current Attention Level: Sustained Memory: Decreased short-term memory Following Commands: Follows one step commands with increased time, Follows one step commands inconsistently Safety/Judgement: Decreased  awareness of safety, Decreased awareness of deficits Awareness: Emergent Problem Solving: Slow processing, Difficulty sequencing, Requires verbal cues General Comments: pt sleepy yet impulsive to move, decreased insight to safety and deficits, distracted by "I can't breathe"        Exercises      General Comments General comments (skin integrity, edema, etc.): Pt SpO2 >84% on 4LO2 via [place]      Pertinent Vitals/Pain Pain Assessment Pain Assessment: No/denies pain    Home Living                          Prior Function            PT Goals (current goals can now be found in the care plan section) Acute Rehab PT Goals Patient Stated Goal: to go home PT Goal Formulation: With patient Time For Goal Achievement: 02/18/23 Potential to Achieve Goals: Good Progress towards PT goals: Progressing toward goals    Frequency    Min 1X/week      PT Plan      Co-evaluation              AM-PAC PT "6 Clicks" Mobility   Outcome Measure  Help needed turning from your back to your side while in a flat bed without using bedrails?: A Little Help needed moving from lying on your back to sitting on the side of a flat bed without using bedrails?: A Little Help needed moving to and from a bed to a chair (including a wheelchair)?: A Little Help needed standing up from a chair using your arms (e.g., wheelchair or bedside chair)?: A Little Help needed to walk in hospital room?: Total Help needed climbing 3-5 steps with a railing? : Total 6 Click Score: 14    End of Session Equipment Utilized During Treatment: Gait belt;Oxygen Activity Tolerance: Patient limited by fatigue Patient left: with call bell/phone within reach;in bed;with bed alarm set;with nursing/sitter in room Nurse Communication: Mobility status PT Visit Diagnosis: Unsteadiness on feet (R26.81);Muscle weakness (generalized) (M62.81)     Time: 9147-8295 PT Time Calculation (min) (ACUTE ONLY): 26  min  Charges:    $Gait Training: 23-37 mins PT General Charges $$ ACUTE PT VISIT: 1 Visit                     Lewis Shock, PT, DPT Acute Rehabilitation Services Secure chat preferred Office #: [phone]    Iona Hansen 02/09/2023, 1:56 PM

## 2023-02-09 NOTE — Progress Notes (Signed)
Notified by Dr. Tonia Brooms that this IMTS patient will be transferred out of ICU on 10/24.   IMTS attending will assume care on 10/24 at 7AM. Bridge/Senior IMTS resident to see in the AM.  Morene Crocker, MD [facility] Internal Medicine Program - PGY-2 02/09/2023, 10:07 AM Pager# [phone]   Please contact the on call pager after 5 pm and on weekends at [phone].

## 2023-02-09 NOTE — Progress Notes (Addendum)
CSW received consult for possible SNF placement at time of discharge. CM confirmed with CSW plan is for LTAC.Referral sent for LTAC. TOC will continue to follow.

## 2023-02-09 NOTE — TOC Progression Note (Signed)
Transition of Care [facility]) - Progression Note    Patient Details  Name: Francisco [name] MRN: [id number] Date of Birth: 1960-04-30  [facility]) CM/SW Contact  Elliot Cousin, RN Phone Number:[phone] 02/09/2023, 5:32 PM  Clinical Narrative:    CM offered choice for LTAC, pt agreeable to [facility]. Referral sent to [facility] rep. States he will begin [place] process.         Expected Discharge Plan and Services        Social Determinants of Health (SDOH) Interventions SDOH Screenings   Food Insecurity: Food Insecurity Present (02/07/2023)  Housing: Medium Risk (05/05/2022)  Alcohol Screen: Low Risk  (02/07/2023)  Depression (PHQ2-9): Medium Risk (09/27/2022)  Tobacco Use: High Risk (02/07/2023)    Readmission Risk Interventions     No data to display

## 2023-02-10 DIAGNOSIS — J441 Chronic obstructive pulmonary disease with (acute) exacerbation: Secondary | ICD-10-CM | POA: Diagnosis not present

## 2023-02-10 DIAGNOSIS — F411 Generalized anxiety disorder: Secondary | ICD-10-CM

## 2023-02-10 LAB — BASIC METABOLIC PANEL
Anion gap: 9 (ref 5–15)
BUN: 19 mg/dL (ref 8–23)
CO2: 37 mmol/L — ABNORMAL HIGH (ref 22–32)
Calcium: 9.5 mg/dL (ref 8.9–10.3)
Chloride: 97 mmol/L — ABNORMAL LOW (ref 98–111)
Creatinine, Ser: 0.72 mg/dL (ref 0.61–1.24)
GFR, Estimated: >60 mL/min (ref >60)
Glucose, Bld: 90 mg/dL (ref 70–99)
Potassium: 4 mmol/L (ref 3.5–5.1)
Sodium: 143 mmol/L (ref 135–145)

## 2023-02-10 LAB — CBC
HCT: 44.6 % (ref 39.0–52.0)
Hemoglobin: 14.3 g/dL (ref 13.0–17.0)
MCH: 31.3 pg (ref 26.0–34.0)
MCHC: 32.1 g/dL (ref 30.0–36.0)
MCV: 97.6 fL (ref 80.0–100.0)
Platelets: 186 10*3/uL (ref 150–400)
RBC: 4.57 MIL/uL (ref 4.22–5.81)
RDW: 12.8 % (ref 11.5–15.5)
WBC: 9.8 10*3/uL (ref 4.0–10.5)
nRBC: 0 % (ref 0.0–0.2)

## 2023-02-10 LAB — MAGNESIUM: Magnesium: 2.1 mg/dL (ref 1.7–2.4)

## 2023-02-10 MED ORDER — OXYCODONE HCL 5 MG PO TABS
5.0000 mg | ORAL_TABLET | Freq: Once | ORAL | Status: AC
  Administered 2023-02-10: 5 mg via ORAL
  Filled 2023-02-10: qty 1

## 2023-02-10 MED ORDER — AMLODIPINE BESYLATE 5 MG PO TABS
5.0000 mg | ORAL_TABLET | Freq: Every day | ORAL | Status: DC
  Administered 2023-02-10 – 2023-02-11 (×2): 5 mg via ORAL
  Filled 2023-02-10 (×2): qty 1

## 2023-02-10 NOTE — Progress Notes (Signed)
   02/10/23 2011  BiPAP/CPAP/SIPAP  BiPAP/CPAP/SIPAP Pt Type Adult  Reason BIPAP/CPAP not in use Non-compliant

## 2023-02-10 NOTE — Progress Notes (Signed)
Overview: Francisco [name] is a 62 y.o. with a pertinent PMH of tobacco use (100 pack year), COPD, HTN, HLD, CAD s/p PCI, who presented on 10/14 with 4-5 days of shortness of breath and admitted for COPD exacerbation. Initially was admitted to ICU given tenuous status but transferred to the floors on 02/03/23. Pt had respiratory distress on 02/05/23 and was transferred to the ICU for Precedex gtt as he was not tolerating Bipap due to concern of anxiety. In the interim, he was given Unasyn for empiric coverage for aspiration pneumonia. He improved and IMTS assumed care on 02/10/23.   Overnight: NAEON  Subjective: States doing better. Still has some shortness of breath. No chest pain or other acute concerns.    Objective: afebrile, HR in 90s, BP 200's/100's. Satting well on RA.   Vital signs in last 24 hours: Vitals:   02/10/23 0430 02/10/23 0500 02/10/23 0600 02/10/23 0700  BP:  (!) 179/103 (!) 168/102 (!) 207/102  Pulse: 87  (!) 189 95  Resp: 19 (!) 24 19 (!) 22  Temp:      TempSrc:      SpO2: 94%  100% 90%  Weight:  77.2 kg    Height:       Supplemental O2: Room Air Last BM Date : 02/09/23 SpO2: 90 % O2 Flow Rate (L/min): 2 L/min FiO2 (%): 40 % Filed Weights   02/06/23 0615 02/09/23 0500 02/10/23 0500  Weight: 83.3 kg 78.5 kg 77.2 kg    Intake/Output Summary (Last 24 hours) at 02/10/2023 0705 Last data filed at 02/10/2023 0500 Gross per 24 hour  Intake 235.71 ml  Output 3175 ml  Net -2939.29 ml   Net IO Since Admission: -6,326.09 mL [02/10/23 0705]  Physical Exam General: NAD HENT: on 2 L [place] Lungs:  no wheeze or other adventitious sounds present. Decreased movement at bases Cardiovascular: NSR with some irregular beats, good radial pulse Abdomen: No TTP MSK: No asymmetry Skin: no lesions noted on skin Neuro: alert and oriented x4 Psych: normal mood and normal affect  Diagnostics    Latest Ref Rng & Units 02/10/2023    2:52 AM 02/09/2023    2:36 AM 02/08/2023     8:03 AM  CBC  WBC 4.0 - 10.5 K/uL 9.8  7.7  8.0   Hemoglobin 13.0 - 17.0 g/dL 40.9  81.1  91.4   Hematocrit 39.0 - 52.0 % 44.6  43.4  43.2   Platelets 150 - 400 K/uL 186  193  200        Latest Ref Rng & Units 02/10/2023    2:52 AM 02/09/2023    2:36 AM 02/08/2023    8:03 AM  BMP  Glucose 70 - 99 mg/dL 90  98  86   BUN 8 - 23 mg/dL 19  25  29    Creatinine 0.61 - 1.24 mg/dL 7.82  9.56  2.13   Sodium 135 - 145 mmol/L 143  142  140   Potassium 3.5 - 5.1 mmol/L 4.0  5.0  4.8   Chloride 98 - 111 mmol/L 97  97  95   CO2 22 - 32 mmol/L 37  39  36   Calcium 8.9 - 10.3 mg/dL 9.5  9.1  9.2     Magnesium: 2.1  Assessment/Plan: Principal Problem:   Respiratory failure (HCC) Active Problems:   Pulmonary hypertension (HCC)  Acute hypoxemic and hypercapnic respiratory failure 2/2 COPD exacerbation Tobacco use disorder Improving. Out of the ICU today. On nebulizer  including brovana, pulmonicort, yupelri, and prednisone day 3/5.Will plan to continue this and switch him over to a triple inhaler. O2 goal is 88-92 %. Bicarb slightly elevated at 37. Will continue to monitor.    Hx combined systolic and diastolic CHF EF was 60 %. GIIDD. Pt RAP was 15 % which is elevated. Pt is quite hypertensive this am and is only on metoprolol 25 mg BID. Pt has quite high anxiety level which could be contributing but the degree of elevation is concerning, so will add another hypertensive. 5 mg amlodipine added today. Will continue to monitor his blood pressure.  HLD CAD s/p PCI Chronic. Will continue lipitor and aspirin. Last LDL was 117 but this was checked in 04/2021. Pt will need this repeat OP.   Bipolar Disorder Anxiety Started on quetiapine in the ICU. Pt appears to be tolerating this better. Continue quetiapine. Started on klonopin for his anxiety given he was not able to tolerate bipap. Will continue this for now but plan to wean when able.    Lumbar back pain Initial concern was for discitis.  Blood cultures are NGTD. It has improved with conservative measures. Will continue to monitor.   GERD Not on any therapy outside. Protonix was started at admission. Will continue protonix for PPI trial but plan is to wean off this medication.    Diet: Heart Healthy IVF: None, VTE: Enoxaparin Code: Full PT/OT recs: CIR Prior to Admission Living Arrangement: Home Anticipated Discharge Location: Home Barriers to Discharge: Medical Stability Dispo: Anticipated discharge in approximately 2-3 day(s).   Gwenevere Abbot, MD [facility] Bridegroom. [facility] Internal Medicine Residency, PGY-3  Pager: [phone] After 5 pm and on weekends: Please call the on-call pager

## 2023-02-10 NOTE — Progress Notes (Signed)
Occupational Therapy Treatment Patient Details Name: Francisco [name] MRN: [id number] DOB: Aug 28, 1960 Today's Date: 02/10/2023   History of present illness Pt is a 62 yo male admitted 10/15 with COPD exacerbation. PMH includes CHF, COPD, CAD s/p PCI, HTN, MI, Hep-C, tobacco use, IVDA (previously heroin, methamphetamine).   OT comments  Re-eval due to change in status and transfer to ICU. Pt with decreased respiratory status, safety, awareness, strength, and balance. Needing up to mod cues for safety secondary to impulsivity. CGA-min A for functional mobility. Mod - max A for LB ADL with pt refusing to attempt to reach feet and difficulty connecting need for optimizing movement and ADL here to be able to safely go home. Updated discharge recommendation to inpatient rehab <3 hours/day.       If plan is discharge home, recommend the following:  A little help with walking and/or transfers;A lot of help with bathing/dressing/bathroom;Assistance with cooking/housework;Direct supervision/assist for medications management;Direct supervision/assist for financial management;Assist for transportation;Help with stairs or ramp for entrance;Supervision due to cognitive status   Equipment Recommendations  None recommended by OT    Recommendations for Other Services      Precautions / Restrictions Precautions Precautions: Fall Restrictions Weight Bearing Restrictions: No       Mobility Bed Mobility Overal bed mobility: Needs Assistance Bed Mobility: Supine to Sit, Sit to Supine     Supine to sit: Contact guard Sit to supine: Contact guard assist   General bed mobility comments: mod cues for safety    Transfers Overall transfer level: Needs assistance Equipment used: None, Rolling walker (2 wheels) Transfers: Sit to/from Stand Sit to Stand: Contact guard assist           General transfer comment: CGA and mod cues for safety     Balance Overall balance assessment: Needs  assistance Sitting-balance support: No upper extremity supported, Feet supported, Single extremity supported Sitting balance-Leahy Scale: Fair     Standing balance support: Bilateral upper extremity supported, During functional activity, Reliant on assistive device for balance Standing balance-Leahy Scale: Poor Standing balance comment: reliant on RW                           ADL either performed or assessed with clinical judgement   ADL Overall ADL's : Needs assistance/impaired                     Lower Body Dressing: Moderate assistance Lower Body Dressing Details (indicate cue type and reason): Mod A secondary to lack of effort             Functional mobility during ADLs: Contact guard assist;Minimal assistance;Rolling walker (2 wheels) General ADL Comments: impulsive, unaware of incontinence, poor awareness of current breathing status    Extremity/Trunk Assessment Upper Extremity Assessment Upper Extremity Assessment: Right hand dominant RUE Sensation: decreased proprioception RUE Coordination: decreased fine motor;decreased gross motor LUE Sensation: decreased proprioception LUE Coordination: decreased fine motor;decreased gross motor   Lower Extremity Assessment Lower Extremity Assessment: Defer to PT evaluation        Vision       Perception     Praxis      Cognition Arousal: Alert, Lethargic Behavior During Therapy: Flat affect, Impulsive Overall Cognitive Status: Impaired/Different from baseline (No family or caregiver present to determine baseline) Area of Impairment: Attention, Memory, Safety/judgement, Following commands, Awareness, Problem solving  Current Attention Level: Sustained Memory: Decreased short-term memory Following Commands: Follows one step commands with increased time, Follows one step commands inconsistently Safety/Judgement: Decreased awareness of safety, Decreased awareness of  deficits Awareness: Emergent Problem Solving: Slow processing, Difficulty sequencing, Requires verbal cues General Comments: Poor awareness, reporting he is unable to do any LB ADL and ultimately requiring up to max A secondary to lack of effort, but then constantly asking to go home; difficulty understanding how ability to get dressed and manage bladder (incontinent). very impulsive throughoutm needing intermittent asisst for balance and to avoid getting tangled in lines Perseverative on wanting to go home        Exercises      Shoulder Instructions       General Comments SpO2 >93% throughout; poor activity tolerance; OOB 2 mins prior to fatigue and need for rest    Pertinent Vitals/ Pain       Pain Assessment Pain Assessment: No/denies pain  Home Living                                          Prior Functioning/Environment              Frequency  Min 1X/week        Progress Toward Goals  OT Goals(current goals can now be found in the care plan section)  Progress towards OT goals: Not progressing toward goals - comment (Pt with set back and in ICU since last session. Goals remain appropriate, but pt with decr awareness, safety)  Acute Rehab OT Goals Patient Stated Goal: get home OT Goal Formulation: With patient Time For Goal Achievement: 02/18/23 Potential to Achieve Goals: Good ADL Goals Pt Will Perform Grooming: with supervision;standing Pt Will Perform Upper Body Bathing: with supervision;sitting Pt Will Perform Lower Body Bathing: with supervision;sitting/lateral leans;sit to/from stand Pt Will Perform Lower Body Dressing: with modified independence;sit to/from stand;sitting/lateral leans Pt Will Transfer to Toilet: with supervision;ambulating;regular height toilet (with LRAD) Pt Will Perform Toileting - Clothing Manipulation and hygiene: sit to/from stand;sitting/lateral leans;with modified independence  Plan      Co-evaluation                  AM-PAC OT "6 Clicks" Daily Activity     Outcome Measure   Help from another person eating meals?: A Little Help from another person taking care of personal grooming?: A Little Help from another person toileting, which includes using toliet, bedpan, or urinal?: A Little Help from another person bathing (including washing, rinsing, drying)?: A Lot Help from another person to put on and taking off regular upper body clothing?: A Little Help from another person to put on and taking off regular lower body clothing?: A Little 6 Click Score: 17    End of Session Equipment Utilized During Treatment: Rolling walker (2 wheels)  OT Visit Diagnosis: Other abnormalities of gait and mobility (R26.89);Other (comment);Other symptoms and signs involving cognitive function (decreased activity tolerance.)   Activity Tolerance Patient limited by fatigue   Patient Left with call bell/phone within reach;in bed;with bed alarm set   Nurse Communication Mobility status (RN present)        Time: 9323-5573 OT Time Calculation (min): 18 min  Charges: OT General Charges $OT Visit: 1 Visit OT Evaluation $OT Re-eval: 1 Re-eval  Tyler Deis, OTR/L [facility] Acute Rehabilitation Office: [phone]   Myrla Halsted 02/10/2023,  5:01 PM

## 2023-02-10 NOTE — Plan of Care (Signed)
Patient progressing towards transfer out of ICU. Continued work on O2 requirements and clearing secretions with cough and deep breathing. Pain episode late in shift controlled with PRN medication called in.   Marletta Lor RN 02/10/2023 Problem: Education: Goal: Knowledge of General Education information will improve Description: Including pain rating scale, medication(s)/side effects and non-pharmacologic comfort measures Outcome: Progressing   Problem: Health Behavior/Discharge Planning: Goal: Ability to manage health-related needs will improve Outcome: Progressing   Problem: Clinical Measurements: Goal: Ability to maintain clinical measurements within normal limits will improve Outcome: Progressing Goal: Will remain free from infection Outcome: Progressing Goal: Diagnostic test results will improve Outcome: Progressing Goal: Respiratory complications will improve Outcome: Progressing Goal: Cardiovascular complication will be avoided Outcome: Progressing   Problem: Activity: Goal: Risk for activity intolerance will decrease Outcome: Progressing   Problem: Nutrition: Goal: Adequate nutrition will be maintained Outcome: Progressing   Problem: Coping: Goal: Level of anxiety will decrease Outcome: Progressing   Problem: Elimination: Goal: Will not experience complications related to bowel motility Outcome: Progressing Goal: Will not experience complications related to urinary retention Outcome: Progressing   Problem: Pain Managment: Goal: General experience of comfort will improve Outcome: Progressing   Problem: Safety: Goal: Ability to remain free from injury will improve Outcome: Progressing   Problem: Skin Integrity: Goal: Risk for impaired skin integrity will decrease Outcome: Progressing

## 2023-02-11 ENCOUNTER — Other Ambulatory Visit (HOSPITAL_COMMUNITY): Payer: Self-pay

## 2023-02-11 DIAGNOSIS — J9622 Acute and chronic respiratory failure with hypercapnia: Secondary | ICD-10-CM | POA: Diagnosis not present

## 2023-02-11 DIAGNOSIS — I25118 Atherosclerotic heart disease of native coronary artery with other forms of angina pectoris: Secondary | ICD-10-CM

## 2023-02-11 DIAGNOSIS — I5042 Chronic combined systolic (congestive) and diastolic (congestive) heart failure: Secondary | ICD-10-CM

## 2023-02-11 DIAGNOSIS — I272 Pulmonary hypertension, unspecified: Secondary | ICD-10-CM

## 2023-02-11 DIAGNOSIS — J9621 Acute and chronic respiratory failure with hypoxia: Secondary | ICD-10-CM | POA: Diagnosis not present

## 2023-02-11 DIAGNOSIS — J441 Chronic obstructive pulmonary disease with (acute) exacerbation: Secondary | ICD-10-CM

## 2023-02-11 DIAGNOSIS — J9601 Acute respiratory failure with hypoxia: Secondary | ICD-10-CM

## 2023-02-11 LAB — BASIC METABOLIC PANEL
Anion gap: 8 (ref 5–15)
BUN: 16 mg/dL (ref 8–23)
CO2: 39 mmol/L — ABNORMAL HIGH (ref 22–32)
Calcium: 9.2 mg/dL (ref 8.9–10.3)
Chloride: 96 mmol/L — ABNORMAL LOW (ref 98–111)
Creatinine, Ser: 0.76 mg/dL (ref 0.61–1.24)
GFR, Estimated: >60 mL/min (ref >60)
Glucose, Bld: 87 mg/dL (ref 70–99)
Potassium: 3.8 mmol/L (ref 3.5–5.1)
Sodium: 143 mmol/L (ref 135–145)

## 2023-02-11 LAB — CBC
HCT: 42.8 % (ref 39.0–52.0)
Hemoglobin: 13.8 g/dL (ref 13.0–17.0)
MCH: 31.1 pg (ref 26.0–34.0)
MCHC: 32.2 g/dL (ref 30.0–36.0)
MCV: 96.4 fL (ref 80.0–100.0)
Platelets: 195 10*3/uL (ref 150–400)
RBC: 4.44 MIL/uL (ref 4.22–5.81)
RDW: 12.7 % (ref 11.5–15.5)
WBC: 9.7 10*3/uL (ref 4.0–10.5)
nRBC: 0 % (ref 0.0–0.2)

## 2023-02-11 LAB — MAGNESIUM: Magnesium: 2 mg/dL (ref 1.7–2.4)

## 2023-02-11 MED ORDER — METOPROLOL TARTRATE 25 MG PO TABS: 25.0000 mg | ORAL_TABLET | Freq: Two times a day (BID) | ORAL | Status: DC

## 2023-02-11 MED ORDER — QUETIAPINE FUMARATE 25 MG PO TABS: 25.0000 mg | ORAL_TABLET | Freq: Two times a day (BID) | ORAL | Status: DC

## 2023-02-11 MED ORDER — LIDOCAINE 5 % EX PTCH: 2.0000 | MEDICATED_PATCH | CUTANEOUS | Status: DC

## 2023-02-11 MED ORDER — IPRATROPIUM BROMIDE 0.02 % IN SOLN
0.5000 mg | Freq: Four times a day (QID) | RESPIRATORY_TRACT | 0 refills | Status: DC | PRN
  Filled 2023-02-11: qty 75, 8d supply, fill #0

## 2023-02-11 MED ORDER — LIDOCAINE 5 % EX PTCH
2.0000 | MEDICATED_PATCH | CUTANEOUS | 0 refills | Status: DC
  Filled 2023-02-11: qty 30, 15d supply, fill #0

## 2023-02-11 MED ORDER — PREDNISONE 20 MG PO TABS: 40.0000 mg | ORAL_TABLET | Freq: Every day | ORAL | Status: DC

## 2023-02-11 MED ORDER — ARFORMOTEROL TARTRATE 15 MCG/2ML IN NEBU
15.0000 ug | INHALATION_SOLUTION | Freq: Two times a day (BID) | RESPIRATORY_TRACT | 0 refills | Status: DC
  Filled 2023-02-11: qty 30, 8d supply, fill #0

## 2023-02-11 MED ORDER — CLONAZEPAM 0.5 MG PO TABS: ORAL_TABLET | ORAL | 0 refills | Status: DC

## 2023-02-11 MED ORDER — BREZTRI AEROSPHERE 160-9-4.8 MCG/ACT IN AERO
2.0000 | INHALATION_SPRAY | Freq: Two times a day (BID) | RESPIRATORY_TRACT | 0 refills | Status: DC
  Filled 2023-02-11: qty 10.7, 30d supply, fill #0

## 2023-02-11 MED ORDER — BUDESONIDE 0.5 MG/2ML IN SUSP
0.5000 mg | Freq: Two times a day (BID) | RESPIRATORY_TRACT | 0 refills | Status: DC
  Filled 2023-02-11: qty 56, 14d supply, fill #0

## 2023-02-11 MED ORDER — TIOTROPIUM BROMIDE MONOHYDRATE 18 MCG IN CAPS
18.0000 ug | ORAL_CAPSULE | Freq: Every day | RESPIRATORY_TRACT | 0 refills | Status: DC
  Filled 2023-02-11: qty 30, 30d supply, fill #0

## 2023-02-11 MED ORDER — PREDNISONE 20 MG PO TABS
40.0000 mg | ORAL_TABLET | Freq: Every day | ORAL | 0 refills | Status: DC
  Filled 2023-02-11: qty 2, 1d supply, fill #0

## 2023-02-11 MED ORDER — OXYCODONE HCL 5 MG PO TABS
5.0000 mg | ORAL_TABLET | Freq: Once | ORAL | Status: DC
  Filled 2023-02-11: qty 1

## 2023-02-11 MED ORDER — METOPROLOL TARTRATE 25 MG PO TABS
25.0000 mg | ORAL_TABLET | Freq: Two times a day (BID) | ORAL | 0 refills | Status: DC
  Filled 2023-02-11: qty 60, 30d supply, fill #0

## 2023-02-11 MED ORDER — CLONAZEPAM 0.5 MG PO TABS: ORAL_TABLET | ORAL | Status: DC

## 2023-02-11 NOTE — Progress Notes (Signed)
    Durable Medical Equipment  (From admission, onward)           Start     Ordered   02/11/23 1019  For home use only DME oxygen  Once       Question Answer Comment  Length of Need 6 Months   Liters per Minute 2   Frequency Continuous (stationary and portable oxygen unit needed)   Oxygen conserving device Yes   Oxygen delivery system Gas      02/11/23 1020   02/07/23 0827  For home use only DME 4 wheeled rolling walker with seat  Once       Question:  Patient needs a walker to treat with the following condition  Answer:  Physical deconditioning   02/07/23 0829

## 2023-02-11 NOTE — Progress Notes (Addendum)
Mobility Specialist Progress Note:   02/11/23 1054  Mobility  Activity Ambulated with assistance in hallway  Level of Assistance Contact guard assist, steadying assist  Assistive Device None  Distance Ambulated (ft) 110 ft  Activity Response Tolerated well  Mobility Referral Yes  $Mobility charge 1 Mobility  Mobility Specialist Start Time (ACUTE ONLY) 0910  Mobility Specialist Stop Time (ACUTE ONLY) 0930  Mobility Specialist Time Calculation (min) (ACUTE ONLY) 20 min   Pre Mobility: 122 HR , 83% SpO2 RA During Mobility: 132 HR , 76% - 93% SpO2 RA- 2 L Post Mobility: 107 HR  Pt received in bed, agreeable to mobility. Desat to 76% on RA during ambulation. C/o slight SOB, pursed lip breathing encouraged. Pt denied any lightheadedness or dizziness. Pt needing 2 L for SpO2 levels to reach above 90%. Pt grabbing hand rails for gait support during ambulation. Pt left in room asymptomatic with call bell in reach and all needs met. RN notified.    Leory Plowman  Mobility Specialist Please contact via [employer] office at [phone]

## 2023-02-11 NOTE — Discharge Instructions (Addendum)
You were hospitalized for acute respiratory failure due to your COPD. Thank you for allowing [place] to be part of your care.   We arranged for you to follow up at:  - [facility] on 02/17/2023 at 3:15 PM with Dr. Thomasene Ripple.   Please note these changes made to your medications:   *Please START taking:  Please complete your last dose of steroids tomorrow.  Please take nebulizer and inhalers as instructed on your discharge paperwork to optimize breathing.  Please start taking Breztri   Please taper off your Klonipin with following schedule and STOP after completing:  5 days Klonipin 1 mg twice per day  5 days Klonipin 0.5 mg twice per day  5 days Klonipin 0.25 mg twice per day  Please make sure to ask for a pulmonary referral for PFT testing to assess your lung function and COPD    Please call our clinic if you have any questions or concerns, we may be able to help and keep you from a long and expensive emergency room wait. Our clinic and after hours phone number is [phone], the best time to call is Monday through Friday 9 am to 4 pm but there is always someone available 24/7 if you have an emergency. If you need medication refills please notify your pharmacy one week in advance and they will send [place] a request.

## 2023-02-11 NOTE — Discharge Summary (Signed)
Name: Matrim Gardy MRN: [id number] DOB: [birth year] 62 y.o. PCP: Olegario Messier, MD  Date of Admission: 02/01/2023  1:33 PM Date of Discharge:  1025/2024 Attending Physician: Dr. Criselda Peaches  DISCHARGE DIAGNOSIS:  Primary Problem: Respiratory failure [facility])   Hospital Problems: Principal Problem:   Respiratory failure (HCC) Active Problems:   COPD exacerbation (HCC)   CAD S/P percutaneous coronary angioplasty   Anxiety state   Combined systolic and diastolic congestive heart failure (HCC)   Pulmonary hypertension (HCC)    DISCHARGE MEDICATIONS:   Allergies as of 02/11/2023       Reactions   Ivp Dye [iodinated Contrast Media] Other (See Comments)   Seizures   Tramadol Other (See Comments)   Seizures   Doxycycline Other (See Comments)   Burning sensation to his hands.        Medication List     STOP taking these medications    doxycycline 100 MG capsule Commonly known as: VIBRAMYCIN   metoprolol succinate 25 MG 24 hr tablet Commonly known as: TOPROL-XL       TAKE these medications    albuterol (2.5 MG/3ML) 0.083% nebulizer solution Commonly known as: PROVENTIL Use 1 vial (2.5 mg total) by nebulization every 6 (six) hours as needed for wheezing or shortness of breath.   albuterol 108 (90 Base) MCG/ACT inhaler Commonly known as: VENTOLIN HFA Inhale 2 puffs into the lungs every 4 (four) hours as needed for wheezing or shortness of breath.   amLODipine 5 MG tablet Commonly known as: NORVASC Take 1 tablet (5 mg total) by mouth daily.   aspirin EC 81 MG tablet Take 81 mg by mouth daily. Swallow whole.   atorvastatin 40 MG tablet Commonly known as: LIPITOR Take 1 tablet (40 mg total) by mouth daily.   Breztri Aerosphere 160-9-4.8 MCG/ACT Aero Generic drug: Budeson-Glycopyrrol-Formoterol Inhale 2 puffs into the lungs in the morning and at bedtime.   clonazePAM 0.5 MG tablet Commonly known as: KLONOPIN Take 2 tablets (1 mg total) by mouth 2 (two) times  daily for 5 days, THEN 1 tablet (0.5 mg total) 2 (two) times daily for 5 days, THEN 0.5 tablets (0.25 mg total) 2 (two) times daily for 5 days. Start taking on: February 11, 2023   lidocaine 5 % Commonly known as: LIDODERM Place 2 patches onto the skin daily. Remove & Discard patch within 12 hours or as directed by MD   lisinopril 40 MG tablet Commonly known as: ZESTRIL Take 1/2 tablet (20 mg total) by mouth daily.   metoprolol tartrate 25 MG tablet Commonly known as: LOPRESSOR Take 1 tablet (25 mg total) by mouth 2 (two) times daily.   nicotine 14 mg/24hr patch Commonly known as: NICODERM CQ - dosed in mg/24 hours Place 1 patch (14 mg total) onto the skin daily.   nitroGLYCERIN 0.4 MG SL tablet Commonly known as: NITROSTAT Place 1 tablet (0.4 mg total) under the tongue every 5 (five) minutes x 3 doses as needed for chest pain.   predniSONE 20 MG tablet Commonly known as: DELTASONE Take 2 tablets (40 mg total) by mouth daily with breakfast. Start taking on: February 12, 2023 What changed:  medication strength how much to take   QUEtiapine 25 MG tablet Commonly known as: SEROQUEL Take 1 tablet (25 mg total) by mouth 2 (two) times daily. Started in ICU for agitation, consider discontinuing after discharge from [facility] Equipment  (From  admission, onward)           Start     Ordered   02/11/23 1040  For home use only DME oxygen  Once       Comments: Please continue to use at least 7 days after your discharge. Can use as needed for shortness of breath after discussion with primary provider.  Question Answer Comment  Length of Need 6 Months   Liters per Minute 2   Frequency Continuous (stationary and portable oxygen unit needed)   Oxygen conserving device Yes   Oxygen delivery system Gas      02/11/23 1040   02/07/23 0827  For home use only DME 4 wheeled rolling walker with seat  Once       Question:  Patient needs a walker to treat  with the following condition  Answer:  Physical deconditioning   02/07/23 0829            DISPOSITION AND FOLLOW-UP:  Ketch Rivera was discharged from [facility] in stable condition. At the hospital follow up visit please address:  Follow-up Recommendations: Consults: Pulmonology, needs formal evaluation  Labs: None  Studies: PFTS  Medications:  - Taper Klonipin 1 mg BID (5 days) >>> 0.5 mg BID (5 days) >>> 0.25 mg BID (5 days)  - Continue scheduled nebulizer treatments outlined in discharge meds  - Consider discontinuing Seroquel 25 mg BID after discharged for LTAC, was given for agitation  Follow-up Appointments:  Dr. Thomasene Ripple [facility] 02/17/2023 at 3:15 PM   HOSPITAL COURSE:  Patient Summary: Carmino Zirkelbach is a 62 y.o. with a pertinent PMH of tobacco use (100 pack year), COPD, HTN, HLD, CAD s/p PCI, who presented with 4-5 days of shortness of breath and admitted for COPD exacerbation. At time of admission his only complaint was inability to catch his breath. No cough, fever, chills, sick contacts, sputum production, peripheral edema, orthopnea.   Acute hypoxemic and hypercapnic respiratory failure 2/2 COPD exacerbation Tobacco use disorder Patient required admission onto the ICU service and transferred to the Internal medicine service on 10/17. At that time he needed supplemental O2 with room to wean down for goal SpO2 88-92%. On exam, during a breathing treatment and has diffuse expiratory wheezes on bilateral upper and mid lung fields with decreased breath sounds at bilateral bases. He received scheduled brovana, pulmicort, yupelri nebulizer treatments daily and with prn albuterol and Atrovent as needed. Patient continued to decompensate with respiratory status on 10/19 with worsening desaturations into the 60s requiring BiPAP.Labs were notable with critical hypercarbia of 110s. Patient was readmitted to the critical care service. He was started  on precedex drip and antibiotics broadened to unasyn. IV steroids were restarted and transitioned to PO. Respiratory status continued to improve with use of scheduled nebs and stepdown to the internal medicine teaching service on 10/24. Patient was satting well on 2L on day of discharge. Patient was amenable to LTAC placement on 02/09/23. On walk test, patient desat into the 70s and recovered to the 80s on room air. Home oxygen was ordered. LTAC facility was approved and he was discharged to [facility].   He was instructed to continue scheduled nebulizier treatments at discharge. Also instructed to request pulmonology referral outpatient to follow-up PFTs.   Hx combined systolic and diastolic CHF HTN CAD s/p PCI  HLD ACS r/o OP regimen which includes amlodipine 10 mg daily, lisinopril 20 mg daily, metoprolol 25 mg daily, ASA 81 mg daily and atorvastatin  40 mg daily. Patient continued to be hypertensive to the 180s, tachycardic and complained on 2 day old substernal chest pain. EKG was negative for ischemic events and Trops peaked at 24. CXR was negative for pneumothorax or Low suspicion for cardiac etiology given reassuring labs, EKG and reproducible chest pain with tenderness to palpation. Last blood pressure regimen was lopressor 25 mg BID and amlodipine 5 mg daily. On day of discharge, patient's blood pressure was stable. He was instructed to resume home medication regimen and discuss with primary provider at follow-up appointment.    Anxiety Hospital Delirium/Agitation Throughout hospitalization the patient, he experienced prevalent situational anxiety regarding respiratory status.  The patient was trialed on hydroxyzine, propanolol, ativan, alprazolam and klonopin to help control anxiety.  [name] intermittent agitation worsened and was also more encephalopathic on October 19.  Patient received Haldol, without much relief and continued to become aggressive with nursing staff. Onceventually  transferred to the critical patient's encephalopathy was considered to be due to medications (steroid induced) and possibly due to hypercarbia.  Once in the ICU the patient was started on a Precedex drip. His agitation started to improve with Seroquel 25 mg twice daily dosing and IV haldol was used as needed. Klonopin was increased to 2 mg 3 times per day. Patient desired home discharge, however did not have capacity to be safely discharged home. He was amenable with discharge to [facility] facility for symptomatic improvement. At time of discharge, patient was instructed to taper of klonipin gradually over a 15 day period (Klonipin 1 mg BID 5 days >> 0.5 mg BID for 5 days >> 0.25 mg BID for 5 days)     Lumbar back pain   On exam, he has midline lumbar vertebral tenderness and states that no pain medications truly alleviate his pain. He denies LE weakness or numbness but does note occasional urinary incontinence. The back pain is not new, however I do have concern given history of IVDU. Blood cultures were collected to assess for possible infectious process. Cultures were negative. Pain was controlled on multimodal pain regimen throughout admission, at times requiring oxy for severe pain. Pain was well controlled on tylenol 650 mg q6h as needed and lidocaine patches on day of discharge without complaints.      DISCHARGE INSTRUCTIONS:   Discharge Instructions     Call MD for:   Complete by: As directed    Call MD for:  difficulty breathing, headache or visual disturbances   Complete by: As directed    Call MD for:  difficulty breathing, headache or visual disturbances   Complete by: As directed    Call MD for:  extreme fatigue   Complete by: As directed    Call MD for:  persistant dizziness or light-headedness   Complete by: As directed    Diet - low sodium heart healthy   Complete by: As directed    Increase activity slowly   Complete by: As directed    Increase activity slowly   Complete by: As  directed    No wound care   Complete by: As directed    No wound care   Complete by: As directed        SUBJECTIVE:   Patient reports improved breathing symptoms. Denies chest pain, fevers, chills or difficulties. Desires home discharge and can stay with roommate.Social work and therapy colleagues recommend facility treatment to optimize recovery. Unable to appreciate consequences of going home, under current respiratory state. Patient became agitated and we were  able to deescalate. After discussion, patient amenable with placement with LTAC.   Discharge Vitals:   BP (!) 143/105 (BP Location: Left Arm)   Pulse (!) 36   Temp 97.7 F (36.5 C) (Oral)   Resp 18   Ht 6' (1.829 m)   Wt 77 kg   SpO2 100%   BMI 23.02 kg/m  on 2L [place]   OBJECTIVE:  Physical Exam Constitutional:      Appearance: He is normal weight.  Cardiovascular:     Rate and Rhythm: Normal rate and regular rhythm.  Pulmonary:     Effort: Pulmonary effort is normal. No tachypnea or respiratory distress.     Breath sounds: Examination of the right-middle field reveals wheezing. Examination of the left-middle field reveals wheezing. Examination of the right-lower field reveals decreased breath sounds. Examination of the left-lower field reveals decreased breath sounds. Decreased breath sounds and wheezing present.  Abdominal:     General: Bowel sounds are normal.     Palpations: Abdomen is soft.     Tenderness: There is no abdominal tenderness.  Skin:    General: Skin is warm.  Neurological:     Mental Status: He is alert and oriented to person, place, and time.     Comments: Unable to appreciate, consequences of going home vs going to facility   Psychiatric:     Comments: Intermittently agitated overnight, and during reassessment late morning      Pertinent Labs, Studies, and Procedures:     Latest Ref Rng & Units 02/11/2023    4:18 AM 02/10/2023    2:52 AM 02/09/2023    2:36 AM  CBC  WBC 4.0 - 10.5 K/uL  9.7  9.8  7.7   Hemoglobin 13.0 - 17.0 g/dL 16.1  09.6  04.5   Hematocrit 39.0 - 52.0 % 42.8  44.6  43.4   Platelets 150 - 400 K/uL 195  186  193        Latest Ref Rng & Units 02/11/2023    4:18 AM 02/10/2023    2:52 AM 02/09/2023    2:36 AM  CMP  Glucose 70 - 99 mg/dL 87  90  98   BUN 8 - 23 mg/dL 16  19  25    Creatinine 0.61 - 1.24 mg/dL 4.09  8.11  9.14   Sodium 135 - 145 mmol/L 143  143  142   Potassium 3.5 - 5.1 mmol/L 3.8  4.0  5.0   Chloride 98 - 111 mmol/L 96  97  97   CO2 22 - 32 mmol/L 39  37  39   Calcium 8.9 - 10.3 mg/dL 9.2  9.5  9.1     DG Chest Portable 1 View  Result Date: 02/01/2023 CLINICAL DATA:  Major shortness of breath and chest pain EXAM: PORTABLE CHEST 1 VIEW COMPARISON:  01/31/2023, CT 08/13/2021 FINDINGS: Hyperinflation and emphysema. No acute airspace disease, pleural effusion, or pneumothorax. Normal cardiomediastinal silhouette. IMPRESSION: Hyperinflation and emphysema. Electronically Signed   By: Jasmine Pang M.D.   On: 02/01/2023 16:17     Signed: Peterson Ao, MD Psychiatry Resident, PGY-1 [facility]  Pager: [phone] 1:50 PM, 02/11/2023

## 2023-02-11 NOTE — Progress Notes (Signed)
Report given [facility] at [facility]

## 2023-02-11 NOTE — Progress Notes (Signed)
SATURATION QUALIFICATIONS: (This note is used to comply with regulatory documentation for home oxygen)  Patient Saturations on Room Air at Rest = 83%  Patient Saturations on Room Air while Ambulating = 76%  Patient Saturations on 2 Liters of oxygen while Ambulating = 92%  Please briefly explain why patient needs home oxygen: pt drooped bellow 88% on RA while ambulating

## 2023-02-11 NOTE — TOC Progression Note (Signed)
Transition of Care [facility]) - Progression Note    Patient Details  Name: Francisco [name] MRN: [id number] Date of Birth: 05-Nov-1960  [facility] CM[facility]SW Contact  Epifanio Lesches, RN Phone Number: 02/11/2023, 12:13 PM  Clinical Narrative:    [facility] informed NCM pt with LTAC approval. Pt agreeable to LTAC transition. Pt's RM # [id number].  Nurse to call  report to, [phone]. Receiving MD, Debbe Bales, [phone]. Nurse to call [facility] [phone] to setup transportation to [facility].  Expected Discharge Plan: Long Term Acute Care (LTAC) Barriers to Discharge: No Barriers Identified  Expected Discharge Plan and Services         Expected Discharge Date: 02/11/23                                     Social Determinants of Health (SDOH) Interventions SDOH Screenings   Food Insecurity: Food Insecurity Present (02/07/2023)  Housing: Medium Risk (05/05/2022)  Alcohol Screen: Low Risk  (02/07/2023)  Depression (PHQ2-9): Medium Risk (09/27/2022)  Tobacco Use: High Risk (02/07/2023)    Readmission Risk Interventions     No data to display

## 2023-02-11 NOTE — Progress Notes (Signed)
Pt transported to [facility] with [facility]

## 2023-02-13 ENCOUNTER — Observation Stay (HOSPITAL_COMMUNITY)
Admission: EM | Admit: 2023-02-13 | Discharge: 2023-02-14 | Disposition: A | Discharge status: Home / Self Care | Payer: No Typology Code available for payment source | Attending: Internal Medicine | Admitting: Internal Medicine

## 2023-02-13 ENCOUNTER — Emergency Department (HOSPITAL_COMMUNITY): Payer: No Typology Code available for payment source

## 2023-02-13 ENCOUNTER — Other Ambulatory Visit: Payer: Self-pay

## 2023-02-13 DIAGNOSIS — Z8673 Personal history of transient ischemic attack (TIA), and cerebral infarction without residual deficits: Secondary | ICD-10-CM

## 2023-02-13 DIAGNOSIS — J449 Chronic obstructive pulmonary disease, unspecified: Secondary | ICD-10-CM | POA: Diagnosis present

## 2023-02-13 DIAGNOSIS — I2510 Atherosclerotic heart disease of native coronary artery without angina pectoris: Secondary | ICD-10-CM | POA: Diagnosis not present

## 2023-02-13 DIAGNOSIS — I110 Hypertensive heart disease with heart failure: Secondary | ICD-10-CM | POA: Insufficient documentation

## 2023-02-13 DIAGNOSIS — N179 Acute kidney failure, unspecified: Secondary | ICD-10-CM | POA: Insufficient documentation

## 2023-02-13 DIAGNOSIS — I639 Cerebral infarction, unspecified: Secondary | ICD-10-CM | POA: Diagnosis present

## 2023-02-13 DIAGNOSIS — Z955 Presence of coronary angioplasty implant and graft: Secondary | ICD-10-CM | POA: Insufficient documentation

## 2023-02-13 DIAGNOSIS — Z79899 Other long term (current) drug therapy: Secondary | ICD-10-CM | POA: Insufficient documentation

## 2023-02-13 DIAGNOSIS — G459 Transient cerebral ischemic attack, unspecified: Principal | ICD-10-CM | POA: Diagnosis present

## 2023-02-13 DIAGNOSIS — Z7982 Long term (current) use of aspirin: Secondary | ICD-10-CM | POA: Insufficient documentation

## 2023-02-13 DIAGNOSIS — F17210 Nicotine dependence, cigarettes, uncomplicated: Secondary | ICD-10-CM | POA: Insufficient documentation

## 2023-02-13 DIAGNOSIS — I5042 Chronic combined systolic (congestive) and diastolic (congestive) heart failure: Secondary | ICD-10-CM | POA: Diagnosis not present

## 2023-02-13 DIAGNOSIS — T424X5A Adverse effect of benzodiazepines, initial encounter: Secondary | ICD-10-CM | POA: Diagnosis not present

## 2023-02-13 DIAGNOSIS — J441 Chronic obstructive pulmonary disease with (acute) exacerbation: Secondary | ICD-10-CM | POA: Diagnosis present

## 2023-02-13 DIAGNOSIS — Z7902 Long term (current) use of antithrombotics/antiplatelets: Secondary | ICD-10-CM | POA: Diagnosis not present

## 2023-02-13 DIAGNOSIS — R531 Weakness: Principal | ICD-10-CM

## 2023-02-13 HISTORY — DX: Personal history of transient ischemic attack (TIA), and cerebral infarction without residual deficits: Z86.73

## 2023-02-13 LAB — I-STAT CHEM 8, ED
BUN: 21 mg/dL (ref 8–23)
Calcium, Ion: 1.08 mmol/L — ABNORMAL LOW (ref 1.15–1.40)
Chloride: 98 mmol/L (ref 98–111)
Creatinine, Ser: 1.2 mg/dL (ref 0.61–1.24)
Glucose, Bld: 96 mg/dL (ref 70–99)
HCT: 42 % (ref 39.0–52.0)
Hemoglobin: 14.3 g/dL (ref 13.0–17.0)
Potassium: 3.4 mmol/L — ABNORMAL LOW (ref 3.5–5.1)
Sodium: 140 mmol/L (ref 135–145)
TCO2: 32 mmol/L (ref 22–32)

## 2023-02-13 LAB — CBC
HCT: 42.1 % (ref 39.0–52.0)
Hemoglobin: 13.7 g/dL (ref 13.0–17.0)
MCH: 31.1 pg (ref 26.0–34.0)
MCHC: 32.5 g/dL (ref 30.0–36.0)
MCV: 95.7 fL (ref 80.0–100.0)
Platelets: 236 10*3/uL (ref 150–400)
RBC: 4.4 MIL/uL (ref 4.22–5.81)
RDW: 13.1 % (ref 11.5–15.5)
WBC: 14 10*3/uL — ABNORMAL HIGH (ref 4.0–10.5)
nRBC: 0 % (ref 0.0–0.2)

## 2023-02-13 LAB — CBG MONITORING, ED: Glucose-Capillary: 96 mg/dL (ref 70–99)

## 2023-02-13 LAB — URINALYSIS, ROUTINE W REFLEX MICROSCOPIC
Bilirubin Urine: NEGATIVE
Glucose, UA: NEGATIVE mg/dL
Hgb urine dipstick: NEGATIVE
Ketones, ur: NEGATIVE mg/dL
Leukocytes,Ua: NEGATIVE
Nitrite: NEGATIVE
Protein, ur: NEGATIVE mg/dL
Specific Gravity, Urine: 1.025 (ref 1.005–1.030)
pH: 7 (ref 5.0–8.0)

## 2023-02-13 LAB — RAPID URINE DRUG SCREEN, HOSP PERFORMED
Amphetamines: POSITIVE — AB
Barbiturates: NOT DETECTED
Benzodiazepines: POSITIVE — AB
Cocaine: NOT DETECTED
Opiates: NOT DETECTED
Tetrahydrocannabinol: NOT DETECTED

## 2023-02-13 LAB — ETHANOL: Alcohol, Ethyl (B): <10 mg/dL (ref <10)

## 2023-02-13 LAB — LIPID PANEL
Cholesterol: 134 mg/dL (ref 0–200)
HDL: 49 mg/dL (ref >40)
LDL Cholesterol: 71 mg/dL (ref 0–99)
Total CHOL/HDL Ratio: 2.7 {ratio}
Triglycerides: 70 mg/dL (ref <150)
VLDL: 14 mg/dL (ref 0–40)

## 2023-02-13 LAB — DIFFERENTIAL
Abs Immature Granulocytes: 0.05 10*3/uL (ref 0.00–0.07)
Basophils Absolute: 0 10*3/uL (ref 0.0–0.1)
Basophils Relative: 0 %
Eosinophils Absolute: 0 10*3/uL (ref 0.0–0.5)
Eosinophils Relative: 0 %
Immature Granulocytes: 0 %
Lymphocytes Relative: 24 %
Lymphs Abs: 3.4 10*3/uL (ref 0.7–4.0)
Monocytes Absolute: 1.2 10*3/uL — ABNORMAL HIGH (ref 0.1–1.0)
Monocytes Relative: 8 %
Neutro Abs: 9.4 10*3/uL — ABNORMAL HIGH (ref 1.7–7.7)
Neutrophils Relative %: 68 %

## 2023-02-13 LAB — COMPREHENSIVE METABOLIC PANEL
ALT: 147 U/L — ABNORMAL HIGH (ref 0–44)
AST: 54 U/L — ABNORMAL HIGH (ref 15–41)
Albumin: 3.2 g/dL — ABNORMAL LOW (ref 3.5–5.0)
Alkaline Phosphatase: 49 U/L (ref 38–126)
Anion gap: 9 (ref 5–15)
BUN: 19 mg/dL (ref 8–23)
CO2: 30 mmol/L (ref 22–32)
Calcium: 8.8 mg/dL — ABNORMAL LOW (ref 8.9–10.3)
Chloride: 99 mmol/L (ref 98–111)
Creatinine, Ser: 1.3 mg/dL — ABNORMAL HIGH (ref 0.61–1.24)
GFR, Estimated: >60 mL/min (ref >60)
Glucose, Bld: 101 mg/dL — ABNORMAL HIGH (ref 70–99)
Potassium: 3.4 mmol/L — ABNORMAL LOW (ref 3.5–5.1)
Sodium: 138 mmol/L (ref 135–145)
Total Bilirubin: 1 mg/dL (ref 0.3–1.2)
Total Protein: 5.6 g/dL — ABNORMAL LOW (ref 6.5–8.1)

## 2023-02-13 LAB — PROTIME-INR
INR: 1 (ref 0.8–1.2)
Prothrombin Time: 13.6 s (ref 11.4–15.2)

## 2023-02-13 LAB — HEMOGLOBIN A1C
Hgb A1c MFr Bld: 6.2 % — ABNORMAL HIGH (ref 4.8–5.6)
Mean Plasma Glucose: 131.24 mg/dL

## 2023-02-13 LAB — APTT: aPTT: 24 s (ref 24–36)

## 2023-02-13 MED ORDER — ENOXAPARIN SODIUM 40 MG/0.4ML IJ SOSY
40.0000 mg | PREFILLED_SYRINGE | INTRAMUSCULAR | Status: DC
Start: 2023-02-14 — End: 2023-02-14
  Administered 2023-02-14: 40 mg via SUBCUTANEOUS
  Filled 2023-02-13: qty 0.4

## 2023-02-13 MED ORDER — ASPIRIN 81 MG PO TBEC
81.0000 mg | DELAYED_RELEASE_TABLET | Freq: Every day | ORAL | Status: DC
  Administered 2023-02-13 – 2023-02-14 (×2): 81 mg via ORAL
  Filled 2023-02-13 (×2): qty 1

## 2023-02-13 MED ORDER — IPRATROPIUM-ALBUTEROL 0.5-2.5 (3) MG/3ML IN SOLN
3.0000 mL | Freq: Once | RESPIRATORY_TRACT | Status: AC
  Administered 2023-02-13: 3 mL via RESPIRATORY_TRACT
  Filled 2023-02-13: qty 3

## 2023-02-13 MED ORDER — ACETAMINOPHEN 650 MG RE SUPP: 650.0000 mg | Freq: Four times a day (QID) | RECTAL | Status: DC | PRN

## 2023-02-13 MED ORDER — ACETAMINOPHEN 325 MG PO TABS
650.0000 mg | ORAL_TABLET | Freq: Four times a day (QID) | ORAL | Status: DC | PRN
  Administered 2023-02-14: 650 mg via ORAL
  Filled 2023-02-13: qty 2

## 2023-02-13 MED ORDER — CLOPIDOGREL BISULFATE 75 MG PO TABS
75.0000 mg | ORAL_TABLET | Freq: Every day | ORAL | Status: DC
  Administered 2023-02-13 – 2023-02-14 (×2): 75 mg via ORAL
  Filled 2023-02-13 (×2): qty 1

## 2023-02-13 MED ORDER — IPRATROPIUM-ALBUTEROL 0.5-2.5 (3) MG/3ML IN SOLN
6.0000 mL | Freq: Once | RESPIRATORY_TRACT | Status: AC
  Administered 2023-02-13: 6 mL via RESPIRATORY_TRACT
  Filled 2023-02-13: qty 3

## 2023-02-13 NOTE — ED Notes (Signed)
CBG 96. 

## 2023-02-13 NOTE — ED Provider Notes (Signed)
[address] EMERGENCY DEPARTMENT AT [facility] Provider Note   CSN: [id number] Arrival date & time: 02/13/23  2016  An emergency department physician performed an initial assessment on this suspected stroke patient at 2016.  History  Chief Complaint  Patient presents with   Extremity Weakness    Saran Schueneman is a 62 y.o. male.  HPI Patient had onset of left-sided weakness at about 1500.  He advises he is very weak on the left side and could not get himself out of his recliner chair.  He reports that he could not bear weight to walk independently.  MS was called.  On EMS arrival patient had made it to his porch but was identified to have left-sided weakness and he arrived as code stroke.  Patient reports the left side does continue to feel weak.  He reports he has had chronic problems with difficulty breathing with COPD.  No significant change recently but reports that he just ran out of his inhalers and had some increased trouble today as well.    Home Medications Prior to Admission medications   Medication Sig Start Date End Date Taking? Authorizing Provider  albuterol (PROVENTIL) (2.5 MG/3ML) 0.083% nebulizer solution Use 1 vial (2.5 mg total) by nebulization every 6 (six) hours as needed for wheezing or shortness of breath. 11/04/22   Nooruddin, Jason Fila, MD  albuterol (VENTOLIN HFA) 108 (90 Base) MCG/ACT inhaler Inhale 2 puffs into the lungs every 4 (four) hours as needed for wheezing or shortness of breath. 12/25/22   Nooruddin, Jason Fila, MD  amLODipine (NORVASC) 5 MG tablet Take 1 tablet (5 mg total) by mouth daily. 11/29/22   Nooruddin, Jason Fila, MD  aspirin EC 81 MG tablet Take 81 mg by mouth daily. Swallow whole. Patient not taking: Reported on 02/01/2023    [provider]  atorvastatin (LIPITOR) 40 MG tablet Take 1 tablet (40 mg total) by mouth daily. 11/09/22   Nooruddin, Jason Fila, MD  Budeson-Glycopyrrol-Formoterol (BREZTRI AEROSPHERE) 160-9-4.8 MCG/ACT AERO Inhale 2 puffs  into the lungs in the morning and at bedtime. 02/11/23   Inez Catalina, MD  clonazePAM (KLONOPIN) 0.5 MG tablet Take 2 tablets (1 mg total) by mouth 2 (two) times daily for 5 days, THEN 1 tablet (0.5 mg total) 2 (two) times daily for 5 days, THEN 0.5 tablets (0.25 mg total) 2 (two) times daily for 5 days. 02/11/23 02/26/23  Peterson Ao, MD  lidocaine (LIDODERM) 5 % Place 2 patches onto the skin daily. Remove & Discard patch within 12 hours or as directed by MD 02/11/23   Peterson Ao, MD  lisinopril (ZESTRIL) 40 MG tablet Take 1/2 tablet (20 mg total) by mouth daily. 08/25/22   Quincy Simmonds, MD  metoprolol tartrate (LOPRESSOR) 25 MG tablet Take 1 tablet (25 mg total) by mouth 2 (two) times daily. 02/11/23   Peterson Ao, MD  nicotine (NICODERM CQ - DOSED IN MG/24 HOURS) 14 mg/24hr patch Place 1 patch (14 mg total) onto the skin daily. 06/24/21   Adron Bene, MD  nitroGLYCERIN (NITROSTAT) 0.4 MG SL tablet Place 1 tablet (0.4 mg total) under the tongue every 5 (five) minutes x 3 doses as needed for chest pain. 10/14/22   Nooruddin, Jason Fila, MD  predniSONE (DELTASONE) 20 MG tablet Take 2 tablets (40 mg total) by mouth daily with breakfast. 02/12/23   Peterson Ao, MD  QUEtiapine (SEROQUEL) 25 MG tablet Take 1 tablet (25 mg total) by mouth 2 (two) times daily. Started in ICU for agitation, consider discontinuing  after discharge from [facility] 02/11/23   Peterson Ao, MD      Allergies    Ivp dye [iodinated contrast media], Tramadol, and Doxycycline    Review of Systems   Review of Systems  Physical Exam Updated Vital Signs BP 111/71   Pulse (!) 104   Temp 97.9 F (36.6 C) (Oral)   Resp 17   Ht 6' (1.829 m)   Wt 78 kg   SpO2 93%   BMI 23.32 kg/m  Physical Exam Constitutional:      Comments: Alert no acute distress.  Speech is clear.  Interactive.  No respiratory distress at rest.  HENT:     Head: Normocephalic and atraumatic.     Mouth/Throat:     Pharynx: Oropharynx is  clear.  Eyes:     Extraocular Movements: Extraocular movements intact.  Cardiovascular:     Rate and Rhythm: Normal rate and regular rhythm.  Pulmonary:     Comments: Breath sounds are very soft.  Expiratory wheeze with forced expiration. Abdominal:     General: There is no distension.     Palpations: Abdomen is soft.     Tenderness: There is no abdominal tenderness. There is no guarding.  Musculoskeletal:        General: No swelling or tenderness. Normal range of motion.  Skin:    General: Skin is warm and dry.  Neurological:     Comments: Alert with clear mental status.  Speech is clear.  No objective facial droop.  With grip strength left is slightly less than right.  Patient can elevate left lower extremity off of the bed but cannot hold against resistance as well as he can on the right.  He does have spontaneous movement and use  of all extremities.     ED Results / Procedures / Treatments   Labs (all labs ordered are listed, but only abnormal results are displayed) Labs Reviewed  CBC - Abnormal; Notable for the following components:      Result Value   WBC 14.0 (*)    All other components within normal limits  DIFFERENTIAL - Abnormal; Notable for the following components:   Neutro Abs 9.4 (*)    Monocytes Absolute 1.2 (*)    All other components within normal limits  COMPREHENSIVE METABOLIC PANEL - Abnormal; Notable for the following components:   Potassium 3.4 (*)    Glucose, Bld 101 (*)    Creatinine, Ser 1.30 (*)    Calcium 8.8 (*)    Total Protein 5.6 (*)    Albumin 3.2 (*)    AST 54 (*)    ALT 147 (*)    All other components within normal limits  RAPID URINE DRUG SCREEN, HOSP PERFORMED - Abnormal; Notable for the following components:   Benzodiazepines POSITIVE (*)    Amphetamines POSITIVE (*)    All other components within normal limits  URINALYSIS, ROUTINE W REFLEX MICROSCOPIC - Abnormal; Notable for the following components:   Color, Urine AMBER (*)     All other components within normal limits  HEMOGLOBIN A1C - Abnormal; Notable for the following components:   Hgb A1c MFr Bld 6.2 (*)    All other components within normal limits  I-STAT CHEM 8, ED - Abnormal; Notable for the following components:   Potassium 3.4 (*)    Calcium, Ion 1.08 (*)    All other components within normal limits  ETHANOL  PROTIME-INR  APTT  LIPID PANEL  CBG MONITORING, ED  EKG EKG Interpretation Date/Time:  Sunday February 13 2023 20:43:49 EDT Ventricular Rate:  117 PR Interval:  151 QRS Duration:  80 QT Interval:  285 QTC Calculation: 398 R Axis:   56  Text Interpretation: Sinus tachycardia Atrial premature complex Consider right atrial enlargement no sig change from previous Confirmed by [name] [phone]) on 02/13/2023 11:46:45 PM  Radiology DG Chest Port 1 View  Result Date: 02/13/2023 CLINICAL DATA:  copd cough EXAM: PORTABLE CHEST 1 VIEW COMPARISON:  Chest x-ray 02/09/2023, CT chest 08/13/2021 FINDINGS: The heart and mediastinal contours are within normal limits. No focal consolidation. Chronic coarsened markings with no overt pulmonary edema. No pleural effusion. No pneumothorax. No acute osseous abnormality. IMPRESSION: No active disease. Electronically Signed   By: Tish Frederickson M.D.   On: 02/13/2023 23:37   CT HEAD CODE STROKE WO CONTRAST  Result Date: 02/13/2023 CLINICAL DATA:  Code stroke.  Left-sided weakness EXAM: CT HEAD WITHOUT CONTRAST TECHNIQUE: Contiguous axial images were obtained from the base of the skull through the vertex without intravenous contrast. RADIATION DOSE REDUCTION: This exam was performed according to the departmental dose-optimization program which includes automated exposure control, adjustment of the mA and/or kV according to patient size and/or use of iterative reconstruction technique. COMPARISON:  08/18/2019 FINDINGS: Brain: There is no mass, hemorrhage or extra-axial collection. The size and configuration  of the ventricles and extra-axial CSF spaces are normal. There is hypoattenuation of the periventricular white matter, most commonly indicating chronic ischemic microangiopathy. 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 Vascular: No abnormal hyperdensity of the major intracranial arteries or dural venous sinuses. No intracranial atherosclerosis. Skull: The visualized skull base, calvarium and extracranial soft tissues are normal. Sinuses/Orbits: Small amount of fluid in the maxillary sinuses. the orbits are normal. ASPECTS [facility] Stroke Program Early CT Score) - Ganglionic level infarction (caudate, lentiform nuclei, internal capsule, insula, M1-M3 cortex): 7 - Supraganglionic infarction (M4-M6 cortex): 3 Total score (0-10 with 10 being normal): 10 IMPRESSION: 1. No acute intracranial abnormality. 2. ASPECTS is 10. 3. Chronic ischemic microangiopathy. These results were communicated to Dr. Erick Blinks at 8:38 pm on 02/13/2023 by text page via the [facility] messaging system. Electronically Signed   By: Deatra Robinson M.D.   On: 02/13/2023 20:39    Procedures Procedures    Medications Ordered in ED Medications  aspirin EC tablet 81 mg (81 mg Oral Given 02/13/23 2331)  clopidogrel (PLAVIX) tablet 75 mg (75 mg Oral Given 02/13/23 2338)  ipratropium-albuterol (DUONEB) 0.5-2.5 (3) MG/3ML nebulizer solution 3 mL (3 mLs Nebulization Given 02/13/23 2326)  ipratropium-albuterol (DUONEB) 0.5-2.5 (3) MG/3ML nebulizer solution 6 mL (6 mLs Nebulization Given 02/13/23 2331)    ED Course/ Medical Decision Making/ A&P                                 Medical Decision Making Amount and/or Complexity of Data Reviewed Labs: ordered. Radiology: ordered.  Risk OTC drugs. Prescription drug management. Decision regarding hospitalization.   Patient present with onset of left-sided weakness as outlined.  He arrived as code stroke.  He has been evaluated by stroke team at this time not a candidate for lytics.   He will need further diagnostic imaging and medical management.  At the time my evaluation patient is alert with clear mental status stable airway.  He has some mild weakness on neurologic exam on the left but is functioning using all 4 extremities.  Patient notes  shortness of breath which she identifies as chronic with COPD.  He has run out of inhalers.  Will obtain chest x-ray and treat with nebulizer therapy for management of COPD.  No respiratory distress at rest.  Currently no evident indication for antibiotics based on history and exam.  CBC within normal limits.  Metabolic panel GFR greater than 60.  Potassium low normal 3.4.  At this time patient stable for discharge to internal medicine teaching service as primary service.        Final Clinical Impression(s) / ED Diagnoses Final diagnoses:  Weakness  Chronic obstructive pulmonary disease, unspecified COPD type (HCC)    Rx / DC Orders ED Discharge Orders     None         [name], MD 02/14/23 1247

## 2023-02-13 NOTE — ED Notes (Signed)
Mindy with Stroke team arrived

## 2023-02-13 NOTE — Code Documentation (Signed)
Responded to Code Stroke called at 2007 for L sided weakness/numbness, LSN-1500. Pt arrived at 2016, CBG-96, NIH-2, CT head negative for acute treament. TNK not given-outside window. Plan VS/neuro checks q2h x 12, then q4h, and stroke workup.

## 2023-02-13 NOTE — ED Triage Notes (Signed)
Pt BIB [facility] d/t left arm and leg weakness.  LKN was 1500.  Pt states he was weak when walking.  CODE STROKE called

## 2023-02-13 NOTE — H&P (Incomplete)
Date: 02/14/2023               Patient Name:  Francisco [name] MRN: [id number]  DOB: 09/24/1960 Age / Sex: 62 y.o., male   PCP: Olegario Messier, MD         Medical Service: Internal Medicine Teaching Service         Attending Physician: Dr. Inez Catalina, MD      First Contact: Dr. Peterson Ao, MD Pager [phone]    Second Contact: Dr. Marrianne Mood, MD Pager [phone]         After Hours (After 5p/  First Contact Pager: [phone]  weekends / holidays): Second Contact Pager: [phone]   SUBJECTIVE   Chief Complaint: weakness  History of Present Illness: Francisco [name] is a 62 yo male with COPD, tobacco use disorder, HTN, HLD, and CAPD s/p PCI who presents with left upper and lower extremity weakness.  Of note, the patient was recently hospitalized from 10/15-10/25 for acute hypoxemic and hypercapnic respiratory failure secondary to a COPD exacerbation. His hospital stay was complicated by two transfers to the ICU due to agitation on BiPAP, but after antibiotics and steroids were started, he was able to be transferred back to the floors and then discharged on 10/25 to [facility]. He left AMA from [facility]. The patient continues to have shortness of breath that is unchanged from his recent hospitalization. He also continues to have a cough that is non productive.   The patient states that yesterday, 10/26, he noticed that he suddenly developed left arm and left leg weakness. He had a fall because of his leg weakness, but denies any head trauma or LOC. The patient did not seek care yesterday, but rather, decided to come in today because he was worried by his persistent left sided weakness. He also endorses some word finding difficulty, but no slurred/stuttering of his speech. He denies any fevers, chills, dizziness, vision changes, dysphagia, chest pain, abd pain, n/v/d, or urinary symptoms.    Meds:  Albuterol Amlodipine 5 mg daily Aspirin 81 mg daily Atorvastatin 40 mg daily Breztri 2  puffs BID Cloazepam taper started 10/25: 0.5 mg bid x 5 days, 0.25 mg bid x 5 days, and then 0.25 mg daily x 5 days Lisinopril 20 mg daily Metoprolol 25 mg BID Seroquel 25 mg BID - not taking  Current Meds  Medication Sig   albuterol (PROVENTIL) (2.5 MG/3ML) 0.083% nebulizer solution Use 1 vial (2.5 mg total) by nebulization every 6 (six) hours as needed for wheezing or shortness of breath.   albuterol (VENTOLIN HFA) 108 (90 Base) MCG/ACT inhaler Inhale 2 puffs into the lungs every 4 (four) hours as needed for wheezing or shortness of breath.   amLODipine (NORVASC) 5 MG tablet Take 1 tablet (5 mg total) by mouth daily.   atorvastatin (LIPITOR) 40 MG tablet Take 1 tablet (40 mg total) by mouth daily.   lisinopril (ZESTRIL) 40 MG tablet Take 1/2 tablet (20 mg total) by mouth daily.   metoprolol tartrate (LOPRESSOR) 25 MG tablet Take 1 tablet (25 mg total) by mouth 2 (two) times daily.   nitroGLYCERIN (NITROSTAT) 0.4 MG SL tablet Place 1 tablet (0.4 mg total) under the tongue every 5 (five) minutes x 3 doses as needed for chest pain.    Past Medical History  Past Surgical History:  Procedure Laterality Date   BACK SURGERY     NECK SURGERY     RIGHT/LEFT HEART CATH AND CORONARY ANGIOGRAPHY N/A 04/30/2019  Procedure: RIGHT/LEFT HEART CATH AND CORONARY ANGIOGRAPHY;  Surgeon: Marykay Lex, MD;  Location: [facility] INVASIVE CV LAB;  Service: Cardiovascular;  Laterality: N/A;    Social:  Lives With: roommate Level of Function: independent of ADLs PCP: [facility] Substances: Smokes 0.5 ppd x 48 years (at his max he smoked 2 ppd), no alcohol, but does endorse previous IV heroin use (last used 2 years ago), cocaine use (last used 6 months ago), IV meth, and marijuana   Family History: Reviewed, no pertinent updates  Allergies: Allergies as of 02/13/2023 - Review Complete 02/13/2023  Allergen Reaction Noted   Ivp dye [iodinated contrast media] Other (See Comments) 06/12/2018   Tramadol Other (See  Comments) 06/12/2018   Doxycycline Other (See Comments) 02/01/2023    Review of Systems: A complete ROS was negative except as per HPI.   OBJECTIVE:   Physical Exam: Blood pressure 111/71, pulse (!) 104, temperature 97.9 F (36.6 C), temperature source Oral, resp. rate 17, height 6' (1.829 m), weight 78 kg, SpO2 93%.   Constitutional: ill-appearing middle aged male sitting in bed, in no acute distress HENT: normocephalic atraumatic, mucous membranes moist Eyes: conjunctiva non-erythematous Cardiovascular: tachycardic rate and regular rhythm, no m/r/g Pulmonary/Chest: normal work of breathing on 2 L [place], decreased breath sounds and expiratory wheezing appreciated bilaterally Abdominal: soft, non-tender, non-distended MSK: normal bulk and tone Neurological: alert & oriented x 3, speech is fluent. CN II-XII intact. 5/5 strength in bilateral upper and lower extremities. Normal finger to nose, heel to shin. Gait deferred.  Skin: warm and dry Psych: normal mood and affect  Labs: CBC    Component Value Date/Time   WBC 14.0 (H) 02/13/2023 2020   RBC 4.40 02/13/2023 2020   HGB 14.3 02/13/2023 2023   HGB 17.5 05/18/2021 1518   HCT 42.0 02/13/2023 2023   HCT 51.9 (H) 05/18/2021 1518   PLT 236 02/13/2023 2020   PLT 316 05/18/2021 1518   MCV 95.7 02/13/2023 2020   MCV 93 05/18/2021 1518   MCH 31.1 02/13/2023 2020   MCHC 32.5 02/13/2023 2020   RDW 13.1 02/13/2023 2020   RDW 12.1 05/18/2021 1518   LYMPHSABS 3.4 02/13/2023 2020   LYMPHSABS 1.7 05/18/2021 1518   MONOABS 1.2 (H) 02/13/2023 2020   EOSABS 0.0 02/13/2023 2020   EOSABS 0.0 05/18/2021 1518   BASOSABS 0.0 02/13/2023 2020   BASOSABS 0.0 05/18/2021 1518     CMP     Component Value Date/Time   NA 140 02/13/2023 2023   NA 143 08/25/2022 1407   K 3.4 (L) 02/13/2023 2023   CL 98 02/13/2023 2023   CO2 30 02/13/2023 2020   GLUCOSE 96 02/13/2023 2023   BUN 21 02/13/2023 2023   BUN 16 08/25/2022 1407   CREATININE 1.20  02/13/2023 2023   CALCIUM 8.8 (L) 02/13/2023 2020   PROT 5.6 (L) 02/13/2023 2020   ALBUMIN 3.2 (L) 02/13/2023 2020   AST 54 (H) 02/13/2023 2020   ALT 147 (H) 02/13/2023 2020   ALKPHOS 49 02/13/2023 2020   BILITOT 1.0 02/13/2023 2020   GFRNONAA >60 02/13/2023 2020   GFRAA >60 01/11/2020 0422    Imaging: CXR: No acute cardiopulmonary disease  CT head: No acute findings   EKG: personally reviewed my interpretation is sinus tachycardia with no significant changes compared to EKG from 10/18.   ASSESSMENT & PLAN:   Assessment & Plan by Problem: Principal Problem:   TIA (transient ischemic attack)   Amadou Blackmore is a 62 y.o. person  living with a history of COPD, tobacco use disorder, HTN, HLD, and CAD s/p PCI who presented with left sided weakness and admitted for TIA on hospital day 0  Transient ischemic attack Code stroke called as patient presented with left upper and lower extremity weakness with word finding difficulty. Neuro exam is unremarkable, but symptoms are concerning for an ischemic event. CT head negative in the ED and MRI also negative for acute infarction. Lipid panel did reveal an LDL of 71 and A1c slightly elevated to 6.2%. Other risk factors include history of CAD and polysubstance use disorder (urine tox positive for benzos as expected and amphetamines). Although acute infarction is ruled out with MRI, symptoms are still concerning for TIA/transient cerebral ischemia. Additionally, MRA showed diminished flow of R ICA but no large vessel occlusion.  - Neuro consulted, appreciate recs - Carotid ultrasound - Echo  - PT/OT - Resumed atorvastatin 40 mg daily - Asa 81 mg daily and plavix 75 mg daily x 21 days, followed by aspirin monotherapy thereafter - Holding home antihypertensives to allow for permissive hypertension  COPD  Tobacco use disorder The patient was recently admitted from 10/15-10/25 for COPD exacerbation. He was discharged to [facility] due to continued  supplemental O2 needs but left AMA. He has completed his course of prednisone but states that he continues to feel short of breath. He also continues to have a nonproductive cough, but this is unchanged.  - O2 sat goal 88-92%  - Continue home Breztri 2 puffs BID - Duonebs q4h PRN for wheezing, SOB - Nicotine patch  Hypertension Combined systolic and diastolic CHF Last echo one week ago with EF of 55-60% and grade II diastolic dysfunction. At home the patient is on amlodipine 5 mg daily, lisinopril 20 mg daily, and metoprolol 25 mg BID. Holding home meds in the setting of permissive hypertension.  Anxiety Previous hospital delirium/agitation The patient was discharged on a klonopin taper of 1 mg BID x 5 days (day 3/5 for this currently), followed by 0.5 mg BID x 5 days, and then 0.25 mg BID x 5 days. Resumed taper. He was also discharged with seroquel 25 mg BID with goal of tapering off of this, but he has not been taking it. - Klonopin taper as above - Seroquel 25 mg BID PRN  Diet: Heart Healthy VTE: Enoxaparin IVF: None Code: Full  Prior to Admission Living Arrangement: Home, living roommate Anticipated Discharge Location: Home Barriers to Discharge: medical stability, workup  Dispo: Admit patient to Observation with expected length of stay less than 2 midnights.  Signed: Chauncey Mann DO Internal Medicine Resident PGY-3  02/14/2023, 2:31 AM

## 2023-02-13 NOTE — ED Notes (Signed)
Dr Rometta Emery at stretcher side when EMS arrived

## 2023-02-13 NOTE — Consult Note (Signed)
NEUROLOGY CONSULT NOTE   Date of service: February 13, 2023 Patient Name: Francisco [name] MRN:  [id number] DOB:  Jul 03, 1960 Chief Complaint: "mild L sided weakness" Requesting Provider: [name], MD  History of Present Illness  Francisco [name] is a 63 y.o. male with hx of COPD, HTN, HLD, Hep C, IVDU, tobacco use who presents with sudden onset L sided weakness. Symptoms started roughly around 1500, was unable to get up from a recliner and rolled himself out. Called EMS when he was having trouble with walking and getting up from a couch. EMS found him at the porch and noted mild L sided weakness and brought him in as a code stroke.  Smokes about 0.5ppd. recently quit substance use 2 weeks ago.   LKW: 1500 Modified rankin score: 0-Completely asymptomatic and back to baseline post- stroke IV Thrombolysis: not offered, outside window EVT: not offered, too mild to treat.  NIHSS components Score: Comment  1a Level of Conscious 0[x]  1[]  2[]  3[]      1b LOC Questions 0[x]  1[]  2[]       1c LOC Commands 0[x]  1[]  2[]       2 Best Gaze 0[x]  1[]  2[]       3 Visual 0[x]  1[]  2[]  3[]      4 Facial Palsy 0[x]  1[]  2[]  3[]      5a Motor Arm - left 0[x]  1[]  2[]  3[]  4[]  UN[]    5b Motor Arm - Right 0[x]  1[]  2[]  3[]  4[]  UN[]    6a Motor Leg - Left 0[]  1[x]  2[]  3[]  4[]  UN[]    6b Motor Leg - Right 0[x]  1[]  2[]  3[]  4[]  UN[]    7 Limb Ataxia 0[x]  1[]  2[]  3[]  UN[]     8 Sensory 0[]  1[x]  2[]  UN[]      9 Best Language 0[x]  1[]  2[]  3[]      10 Dysarthria 0[x]  1[]  2[]  UN[]      11 Extinct. and Inattention 0[x]  1[]  2[]       TOTAL: 2       ROS  Comprehensive ROS performed and pertinent positives documented in HPI  Past History   Past Medical History:  Diagnosis Date   CHF (congestive heart failure) (HCC)    COPD (chronic obstructive pulmonary disease) (HCC)    COPD with acute exacerbation (HCC) 10/21/2021   Coronary artery disease    a. s/p prior PCI.   Hepatitis-C    Hypertension    IV drug abuse (HCC)    a.  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   Medically noncompliant    MI (myocardial infarction) (HCC)    Tobacco abuse     Past Surgical History:  Procedure Laterality Date   BACK SURGERY     NECK SURGERY     RIGHT/LEFT HEART CATH AND CORONARY ANGIOGRAPHY N/A 04/30/2019   Procedure: RIGHT/LEFT HEART CATH AND CORONARY ANGIOGRAPHY;  Surgeon: Marykay Lex, MD;  Location: [facility] INVASIVE CV LAB;  Service: Cardiovascular;  Laterality: N/A;    Family History: Family History  Problem Relation Age of Onset   Rheum arthritis Mother    Heart disease Paternal Grandfather     Social History  reports that he has been smoking cigarettes. He has been exposed to tobacco smoke. He has never used smokeless tobacco. He reports current drug use. Drug: Methamphetamines. He reports that he does not drink alcohol.  Allergies  Allergen Reactions   Ivp Dye [Iodinated Contrast Media] Other (See Comments)    Seizures    Tramadol Other (See Comments)    Seizures  Doxycycline Other (See Comments)    Burning sensation to his hands.    Medications  No current facility-administered medications for this encounter.  Current Outpatient Medications:    albuterol (PROVENTIL) (2.5 MG/3ML) 0.083% nebulizer solution, Use 1 vial (2.5 mg total) by nebulization every 6 (six) hours as needed for wheezing or shortness of breath., Disp: 300 mL, Rfl: 2   albuterol (VENTOLIN HFA) 108 (90 Base) MCG/ACT inhaler, Inhale 2 puffs into the lungs every 4 (four) hours as needed for wheezing or shortness of breath., Disp: 6.7 g, Rfl: 2   amLODipine (NORVASC) 5 MG tablet, Take 1 tablet (5 mg total) by mouth daily., Disp: 30 tablet, Rfl: 2   aspirin EC 81 MG tablet, Take 81 mg by mouth daily. Swallow whole. (Patient not taking: Reported on 02/01/2023), Disp: , Rfl:    atorvastatin (LIPITOR) 40 MG tablet, Take 1 tablet (40 mg total) by mouth daily., Disp: 30 tablet, Rfl: 2   Budeson-Glycopyrrol-Formoterol (BREZTRI  AEROSPHERE) 160-9-4.8 MCG/ACT AERO, Inhale 2 puffs into the lungs in the morning and at bedtime., Disp: 10.7 g, Rfl: 0   clonazePAM (KLONOPIN) 0.5 MG tablet, Take 2 tablets (1 mg total) by mouth 2 (two) times daily for 5 days, THEN 1 tablet (0.5 mg total) 2 (two) times daily for 5 days, THEN 0.5 tablets (0.25 mg total) 2 (two) times daily for 5 days., Disp: , Rfl:    lidocaine (LIDODERM) 5 %, Place 2 patches onto the skin daily. Remove & Discard patch within 12 hours or as directed by MD, Disp: , Rfl:    lisinopril (ZESTRIL) 40 MG tablet, Take 1/2 tablet (20 mg total) by mouth daily., Disp: 90 tablet, Rfl: 3   metoprolol tartrate (LOPRESSOR) 25 MG tablet, Take 1 tablet (25 mg total) by mouth 2 (two) times daily., Disp: , Rfl:    nicotine (NICODERM CQ - DOSED IN MG/24 HOURS) 14 mg/24hr patch, Place 1 patch (14 mg total) onto the skin daily., Disp: 30 patch, Rfl: 1   nitroGLYCERIN (NITROSTAT) 0.4 MG SL tablet, Place 1 tablet (0.4 mg total) under the tongue every 5 (five) minutes x 3 doses as needed for chest pain., Disp: 25 tablet, Rfl: 2   predniSONE (DELTASONE) 20 MG tablet, Take 2 tablets (40 mg total) by mouth daily with breakfast., Disp: , Rfl:    QUEtiapine (SEROQUEL) 25 MG tablet, Take 1 tablet (25 mg total) by mouth 2 (two) times daily. Started in ICU for agitation, consider discontinuing after discharge from LTAC, Disp: , Rfl:   Vitals   Vitals:   02/13/23 2000  Weight: 78 kg    Body mass index is 23.32 kg/m.  Physical Exam   Constitutional: Appears well-developed and well-nourished.  Psych: Affect appropriate to situation.  Eyes: No scleral injection.  HENT: No OP obstruction.  Head: Normocephalic.  Cardiovascular: Normal rate and regular rhythm.  Respiratory: Effort normal, non-labored breathing.  GI: Soft.  No distension. There is no tenderness.  Skin: WDI.    Neurologic Examination  Mental status/Cognition: Alert, oriented to self, place, month and year, good attention.   Speech/language: Fluent, comprehension intact, object naming intact, repetition intact.  Cranial nerves:   CN II Pupils equal and reactive to light, no VF deficits    CN III,IV,VI EOM intact, no gaze preference or deviation, no nystagmus    CN V normal sensation in V1, V2, and V3 segments bilaterally    CN VII no asymmetry, no nasolabial fold flattening    CN VIII normal  hearing to speech    CN IX & X normal palatal elevation, no uvular deviation    CN XI 5/5 head turn and 5/5 shoulder shrug bilaterally    CN XII midline tongue protrusion    Motor:  Muscle bulk: normal, tone normal, pronator drift none tremor none Mvmt Root Nerve  Muscle Right Left Comments  SA C5/6 Ax Deltoid 5 5   EF C5/6 Mc Biceps 5 5   EE C6/7/8 Rad Triceps 5 5   WF C6/7 Med FCR     WE C7/8 PIN ECU     F Ab C8/T1 U ADM/FDI 5 5   HF L1/2/3 Fem Illopsoas 4 4-   KE L2/3/4 Fem Quad     DF L4/5 D Peron Tib Ant 5 5   PF S1/2 Tibial Grc/Sol 5 5    Sensation:  Light touch Mildly decreased on the left to touch.   Pin prick    Temperature    Vibration   Proprioception    Coordination/Complex Motor:  - Finger to Nose intact BL - Heel to shin intact BL - Rapid alternating movement are slowed on the left - Gait: deferred   Labs   CBC:  Recent Labs  Lab 02/10/23 0252 02/11/23 0418 02/13/23 2023  WBC 9.8 9.7  --   HGB 14.3 13.8 14.3  HCT 44.6 42.8 42.0  MCV 97.6 96.4  --   PLT 186 195  --     Basic Metabolic Panel:  Lab Results  Component Value Date   NA 140 02/13/2023   K 3.4 (L) 02/13/2023   CO2 39 (H) 02/11/2023   GLUCOSE 96 02/13/2023   BUN 21 02/13/2023   CREATININE 1.20 02/13/2023   CALCIUM 9.2 02/11/2023   GFRNONAA >60 02/11/2023   GFRAA >60 01/11/2020   Lipid Panel:  Lab Results  Component Value Date   LDLCALC 117 (H) 04/20/2021   HgbA1c:  Lab Results  Component Value Date   HGBA1C 5.5 08/18/2019   Urine Drug Screen:     Component Value Date/Time   LABOPIA NONE DETECTED  08/13/2021 0419   COCAINSCRNUR NONE DETECTED 08/13/2021 0419   LABBENZ NONE DETECTED 08/13/2021 0419   AMPHETMU POSITIVE (A) 08/13/2021 0419   THCU NONE DETECTED 08/13/2021 0419   LABBARB NONE DETECTED 08/13/2021 0419    Alcohol Level     Component Value Date/Time   ETH <10 08/18/2019 1127   INR  Lab Results  Component Value Date   INR 1.0 04/28/2019   APTT No results found for: "APTT" AED levels: No results found for: "PHENYTOIN", "ZONISAMIDE", "LAMOTRIGINE", "LEVETIRACETA"   CT Head without contrast(Personally reviewed): CTH was negative for a large hypodensity concerning for a large territory infarct or hyperdensity concerning for an ICH  MR Angio head without contrast and Carotid Duplex BL: pending  MRI Brain: pending  Impression   Francisco [name] is a 62 y.o. male presenting with L sided weakness. Suspect basal ganglia stroke.  Recommendations  - Frequent Neuro checks per stroke unit protocol - Recommend brain imaging with MRI Brain without contrast - Recommend Vascular imaging with MRA Angio Head without contrast and US Carotid doppler - Recommend obtaining TTE - Recommend obtaining Lipid panel with LDL - Please start statin if LDL > 70 - Recommend HbA1c to evaluate for diabetes and how well it is controlled. - Antithrombotic - aspirin 81mg  daily along with plavix 75mg  daily x 21 days, followed by Aspirin 81mg  daily alone. - Recommend DVT ppx - SBP  goal - permissive hypertension first 24 h < 220/110. Held home meds.  - Recommend Telemetry monitoring for arrythmia - Recommend bedside swallow screen prior to PO intake. - Stroke education booklet - Recommend PT/OT/SLP consult - Recommend Urine Tox screen.  ______________________________________________________________________    Welton Flakes

## 2023-02-14 ENCOUNTER — Other Ambulatory Visit: Payer: Self-pay | Admitting: Neurology

## 2023-02-14 ENCOUNTER — Other Ambulatory Visit (HOSPITAL_COMMUNITY): Payer: Self-pay

## 2023-02-14 ENCOUNTER — Observation Stay (HOSPITAL_COMMUNITY): Payer: No Typology Code available for payment source

## 2023-02-14 ENCOUNTER — Observation Stay (HOSPITAL_BASED_OUTPATIENT_CLINIC_OR_DEPARTMENT_OTHER): Payer: No Typology Code available for payment source

## 2023-02-14 DIAGNOSIS — G459 Transient cerebral ischemic attack, unspecified: Secondary | ICD-10-CM

## 2023-02-14 DIAGNOSIS — T424X5A Adverse effect of benzodiazepines, initial encounter: Secondary | ICD-10-CM | POA: Diagnosis present

## 2023-02-14 DIAGNOSIS — I639 Cerebral infarction, unspecified: Secondary | ICD-10-CM | POA: Diagnosis not present

## 2023-02-14 HISTORY — DX: Transient cerebral ischemic attack, unspecified: G45.9

## 2023-02-14 LAB — CBC
HCT: 41.6 % (ref 39.0–52.0)
Hemoglobin: 13.5 g/dL (ref 13.0–17.0)
MCH: 31 pg (ref 26.0–34.0)
MCHC: 32.5 g/dL (ref 30.0–36.0)
MCV: 95.4 fL (ref 80.0–100.0)
Platelets: 239 10*3/uL (ref 150–400)
RBC: 4.36 MIL/uL (ref 4.22–5.81)
RDW: 13.1 % (ref 11.5–15.5)
WBC: 9.3 10*3/uL (ref 4.0–10.5)
nRBC: 0 % (ref 0.0–0.2)

## 2023-02-14 LAB — HEPATIC FUNCTION PANEL
ALT: 146 U/L — ABNORMAL HIGH (ref 0–44)
AST: 52 U/L — ABNORMAL HIGH (ref 15–41)
Albumin: 3.1 g/dL — ABNORMAL LOW (ref 3.5–5.0)
Alkaline Phosphatase: 50 U/L (ref 38–126)
Bilirubin, Direct: 0.2 mg/dL (ref 0.0–0.2)
Indirect Bilirubin: 0.7 mg/dL (ref 0.3–0.9)
Total Bilirubin: 0.9 mg/dL (ref 0.3–1.2)
Total Protein: 5.5 g/dL — ABNORMAL LOW (ref 6.5–8.1)

## 2023-02-14 LAB — BASIC METABOLIC PANEL
Anion gap: 13 (ref 5–15)
BUN: 23 mg/dL (ref 8–23)
CO2: 27 mmol/L (ref 22–32)
Calcium: 8.8 mg/dL — ABNORMAL LOW (ref 8.9–10.3)
Chloride: 100 mmol/L (ref 98–111)
Creatinine, Ser: 1.35 mg/dL — ABNORMAL HIGH (ref 0.61–1.24)
GFR, Estimated: 59 mL/min — ABNORMAL LOW (ref >60)
Glucose, Bld: 192 mg/dL — ABNORMAL HIGH (ref 70–99)
Potassium: 4 mmol/L (ref 3.5–5.1)
Sodium: 140 mmol/L (ref 135–145)

## 2023-02-14 LAB — ECHOCARDIOGRAM LIMITED BUBBLE STUDY

## 2023-02-14 MED ORDER — ATORVASTATIN CALCIUM 40 MG PO TABS
40.0000 mg | ORAL_TABLET | Freq: Every day | ORAL | Status: DC
  Administered 2023-02-14: 40 mg via ORAL
  Filled 2023-02-14: qty 1

## 2023-02-14 MED ORDER — CLONAZEPAM 0.5 MG PO TABS: 0.5000 mg | ORAL_TABLET | Freq: Two times a day (BID) | ORAL | Status: DC

## 2023-02-14 MED ORDER — CLONAZEPAM 0.5 MG PO TABS
1.0000 mg | ORAL_TABLET | Freq: Two times a day (BID) | ORAL | Status: DC
  Administered 2023-02-14 (×2): 1 mg via ORAL
  Filled 2023-02-14 (×2): qty 2

## 2023-02-14 MED ORDER — CLOPIDOGREL BISULFATE 75 MG PO TABS
75.0000 mg | ORAL_TABLET | Freq: Every day | ORAL | 0 refills | Status: AC
  Filled 2023-02-14 – 2023-02-15 (×2): qty 19, 19d supply, fill #0

## 2023-02-14 MED ORDER — QUETIAPINE FUMARATE 25 MG PO TABS: 25.0000 mg | ORAL_TABLET | Freq: Two times a day (BID) | ORAL | Status: DC | PRN

## 2023-02-14 MED ORDER — NICOTINE 14 MG/24HR TD PT24
14.0000 mg | MEDICATED_PATCH | TRANSDERMAL | Status: DC
  Administered 2023-02-14: 14 mg via TRANSDERMAL
  Filled 2023-02-14: qty 1

## 2023-02-14 MED ORDER — LACTATED RINGERS IV BOLUS: 500.0000 mL | Freq: Once | INTRAVENOUS | Status: DC

## 2023-02-14 MED ORDER — LACTATED RINGERS IV BOLUS
1000.0000 mL | Freq: Once | INTRAVENOUS | Status: AC
  Administered 2023-02-14: 1000 mL via INTRAVENOUS

## 2023-02-14 MED ORDER — CLONAZEPAM 0.5 MG PO TABS: ORAL_TABLET | ORAL | 0 refills | Status: DC

## 2023-02-14 MED ORDER — IPRATROPIUM-ALBUTEROL 0.5-2.5 (3) MG/3ML IN SOLN
3.0000 mL | RESPIRATORY_TRACT | Status: DC | PRN
  Administered 2023-02-14: 3 mL via RESPIRATORY_TRACT
  Filled 2023-02-14: qty 3

## 2023-02-14 MED ORDER — BUDESON-GLYCOPYRROL-FORMOTEROL 160-9-4.8 MCG/ACT IN AERO: 2.0000 | INHALATION_SPRAY | Freq: Two times a day (BID) | RESPIRATORY_TRACT | Status: DC

## 2023-02-14 MED ORDER — ASPIRIN 81 MG PO TBEC
81.0000 mg | DELAYED_RELEASE_TABLET | Freq: Every day | ORAL | 0 refills | Status: DC
  Filled 2023-02-14: qty 30, 30d supply, fill #0

## 2023-02-14 MED ORDER — IPRATROPIUM-ALBUTEROL 0.5-2.5 (3) MG/3ML IN SOLN: 6.0000 mL | RESPIRATORY_TRACT | Status: DC | PRN

## 2023-02-14 MED ORDER — UMECLIDINIUM BROMIDE 62.5 MCG/ACT IN AEPB
1.0000 | INHALATION_SPRAY | Freq: Every day | RESPIRATORY_TRACT | Status: DC
  Administered 2023-02-14: 1 via RESPIRATORY_TRACT
  Filled 2023-02-14: qty 7

## 2023-02-14 MED ORDER — MOMETASONE FURO-FORMOTEROL FUM 200-5 MCG/ACT IN AERO
2.0000 | INHALATION_SPRAY | Freq: Two times a day (BID) | RESPIRATORY_TRACT | Status: DC
  Administered 2023-02-14: 2 via RESPIRATORY_TRACT
  Filled 2023-02-14: qty 8.8

## 2023-02-14 NOTE — Discharge Summary (Addendum)
Head: Normocephalic.  Eyes:     General: No visual field deficit.    Extraocular Movements: Extraocular movements intact.     Conjunctiva/sclera: Conjunctivae normal.     Pupils: Pupils are equal, round, and reactive to light.  Cardiovascular:     Rate and Rhythm: Regular rhythm. Tachycardia present.  Pulmonary:     Effort: Pulmonary effort is normal.  Abdominal:     General: Abdomen is flat.     Palpations: Abdomen is soft.  Musculoskeletal:        General: Normal range of motion.     Cervical back: Normal range of motion. No tenderness.  Neurological:     General: No focal deficit present.     Mental Status: He is alert. Mental status is at baseline.     Cranial Nerves: Cranial nerves 2-12 are intact. No cranial nerve deficit, dysarthria or facial asymmetry.     Sensory: Sensation is intact.     Motor: Motor function is intact.     Coordination: Coordination is intact. Finger-Nose-Finger Test normal.  Psychiatric:        Mood and Affect: Mood normal.        Behavior: Behavior is agitated.     Pertinent Labs, Studies, and Procedures:     Latest Ref Rng & Units 02/14/2023    3:49 AM 02/13/2023    8:23 PM 02/13/2023    8:20 PM  CBC  WBC 4.0 - 10.5 K/uL 9.3   14.0   Hemoglobin 13.0 - 17.0 g/dL 62.9  52.8  41.3   Hematocrit 39.0 - 52.0 % 41.6  42.0  42.1   Platelets 150 - 400 K/uL 239   236        Latest Ref Rng & Units 02/14/2023    3:49 AM 02/13/2023    8:23 PM 02/13/2023    8:20 PM  CMP  Glucose 70 - 99 mg/dL 244  96  010    BUN 8 - 23 mg/dL 23  21  19    Creatinine 0.61 - 1.24 mg/dL 2.72  5.36  6.44   Sodium 135 - 145 mmol/L 140  140  138   Potassium 3.5 - 5.1 mmol/L 4.0  3.4  3.4   Chloride 98 - 111 mmol/L 100  98  99   CO2 22 - 32 mmol/L 27   30   Calcium 8.9 - 10.3 mg/dL 8.8   8.8   Total Protein 6.5 - 8.1 g/dL 5.5   5.6   Total Bilirubin 0.3 - 1.2 mg/dL 0.9   1.0   Alkaline Phos 38 - 126 U/L 50   49   AST 15 - 41 U/L 52   54   ALT 0 - 44 U/L 146   147    VAS US CAROTID Carotid Arterial Duplex Study  Patient Name:  Francisco [name]  Date of Exam:   02/14/2023 Medical Rec #: [id number]    Accession #:    [id number] Date of Birth: 03-05-1961    Patient Gender: M Patient Age:   62 years Exam Location:  [facility] Procedure:      VAS US CAROTID Referring Phys: Terrilee Files [facility]  --------------------------------------------------------------------------------   Indications:       TIA. Risk Factors:      Hypertension, hyperlipidemia, current smoker, coronary artery                    disease. Other Factors:     Dilated cardiomyopathy. Heart failure. COPD.  Name: Francisco [name] MRN: [id number] DOB: August 06, 1960 62 y.o. PCP: Francisco Messier, MD  Date of Admission: 02/13/2023  8:17 PM Date of Discharge: 02/14/2023 02/14/2023 Attending Physician: Dr. Criselda Peaches  DISCHARGE DIAGNOSIS:  Primary Problem: TIA (transient ischemic attack)   Hospital Problems: Principal Problem:   TIA (transient ischemic attack) Active Problems:   COPD (chronic obstructive pulmonary disease) (HCC)   Benzodiazepine causing adverse effect in therapeutic use    DISCHARGE MEDICATIONS:   Allergies as of 02/14/2023       Reactions   Ivp Dye [iodinated Contrast Media] Other (See Comments)   Seizures   Tramadol Other (See Comments)   Seizures   Doxycycline Other (See Comments)   Burning sensation to his hands.        Medication List     STOP taking these medications    predniSONE 20 MG tablet Commonly known as: DELTASONE   QUEtiapine 25 MG tablet Commonly known as: SEROQUEL       TAKE these medications    albuterol (2.5 MG/3ML) 0.083% nebulizer solution Commonly known as: PROVENTIL Use 1 vial (2.5 mg total) by nebulization every 6 (six) hours as needed for wheezing or shortness of breath.   albuterol 108 (90 Base) MCG/ACT inhaler Commonly known as: VENTOLIN HFA Inhale 2 puffs into the lungs every 4 (four) hours as needed for wheezing or shortness of breath.   amLODipine 5 MG tablet Commonly known as: NORVASC Take 1 tablet (5 mg total) by mouth daily.   aspirin EC 81 MG tablet Take 1 tablet (81 mg total) by mouth daily. Swallow whole. Start taking on: February 15, 2023   atorvastatin 40 MG tablet Commonly known as: LIPITOR Take 1 tablet (40 mg total) by mouth daily.   Breztri Aerosphere 160-9-4.8 MCG/ACT Aero Generic drug: Budeson-Glycopyrrol-Formoterol Inhale 2 puffs into the lungs in the morning and at bedtime.   clonazePAM 0.5 MG tablet Commonly known as: KLONOPIN Take 2 tablets (1 mg total) by mouth 2 (two) times daily for 2 days,  THEN 1 tablet (0.5 mg total) 2 (two) times daily for 5 days, THEN 0.5 tablets (0.25 mg total) 2 (two) times daily for 5 days. Start taking on: February 14, 2023 What changed: See the new instructions.   clopidogrel 75 MG tablet Commonly known as: PLAVIX Take 1 tablet (75 mg total) by mouth daily for 19 days. Complete medications until finished. Start taking on: February 15, 2023   lidocaine 5 % Commonly known as: LIDODERM Place 2 patches onto the skin daily. Remove & Discard patch within 12 hours or as directed by MD   lisinopril 40 MG tablet Commonly known as: ZESTRIL Take 1/2 tablet (20 mg total) by mouth daily.   metoprolol tartrate 25 MG tablet Commonly known as: LOPRESSOR Take 1 tablet (25 mg total) by mouth 2 (two) times daily.   nicotine 14 mg/24hr patch Commonly known as: NICODERM CQ - dosed in mg/24 hours Place 1 patch (14 mg total) onto the skin daily.   nitroGLYCERIN 0.4 MG SL tablet Commonly known as: NITROSTAT Place 1 tablet (0.4 mg total) under the tongue every 5 (five) minutes x 3 doses as needed for chest pain.        DISPOSITION AND FOLLOW-UP:  [name] was discharged from [facility] in stable condition. At the hospital follow up visit please address:  Follow-up Recommendations: Consults: Neurology, Needs referral to [facility] - Neuro signed note after his leaving AMA.  Labs: None  Studies: Follow-up  PSV cm/sEDV cm/sDescribe    Arm Pressure (mmHG) +----------+--------+--------+------------+-------------------+ Subclavian                Not assessed                    +----------+--------+--------+------------+-------------------+  +---------+--------+--+--------+--+ VertebralPSV cm/s46EDV cm/s16 +---------+--------+--+--------+--+       Summary: Right Carotid: Cannot rule out CCA occlusion, retrograde flow of the ECA into                the ICA, and  occlusion of the ICA.  Left Carotid: At least 40-59% ICA stenosis. Higher velocities may be obtained if               patient was cooperative.  Vertebrals:  Bilateral vertebral arteries demonstrate antegrade flow. Subclavians: Not assessed.  *See table(s) above for measurements and observations.   Suggest Peripheral Vascular Consult. Repeat Carotid duplex/CTA of neck when patient is cooperative could better interrogate the carotid arteries.       Preliminary   ECHOCARDIOGRAM LIMITED BUBBLE STUDY    ECHOCARDIOGRAM LIMITED REPORT       Patient Name:   Francisco [name] Date of Exam: 02/14/2023 Medical Rec #:  [id number]   Height:       72.0 in Accession #:    [id number]  Weight:       172.0 lb Date of Birth:  [birth year]   BSA:          1.998 m Patient Age:    62 years    BP:           123/79 mmHg Patient Gender: M           HR:           69 bpm. Exam Location:  Inpatient  Procedure: Saline Contrast Bubble Study and Limited Echo  Indications:    Stroke I63.9   History:        Patient has prior history of Echocardiogram examinations, most                 recent 02/07/2023. Cardiomyopathy, CAD, Stroke, COPD and TIA;                 Risk Factors:Current Smoker and Hypertension.   Sonographer:    Dondra Prader RVT RCS Referring Phys: [id number] [facility] ([facility]  IMPRESSIONS   1. Left ventricular ejection fraction, by estimation, is 55 to 60%. The left ventricle has normal function.  2. Right ventricular systolic function is normal. The right ventricular size is normal.  3. Agitated saline contrast bubble study was negative, with no evidence of any interatrial shunt.  FINDINGS  Left Ventricle: Left ventricular ejection fraction, by estimation, is 55 to 60%. The left ventricle has normal function. There is no left ventricular hypertrophy.  Right Ventricle: The right ventricular size is normal. No increase in right ventricular wall thickness. Right ventricular systolic function is  normal.  IAS/Shunts: Agitated saline contrast was given intravenously to evaluate for intracardiac shunting. Agitated saline contrast bubble study was negative, with no evidence of any interatrial shunt.  Clearnce Hasten Electronically signed by Clearnce Hasten Signature Date/Time: 02/14/2023/12:48:54 PM      Final   MR ANGIO HEAD WO CONTRAST CLINICAL DATA:  Left-sided weakness  EXAM: MRI HEAD WITHOUT CONTRAST  MRA HEAD WITHOUT CONTRAST  TECHNIQUE: Multiplanar, multi-echo pulse sequences of the brain and surrounding structures were acquired without intravenous contrast. Angiographic images of the Circle of Willis were acquired using MRA technique  PSV cm/sEDV cm/sDescribe    Arm Pressure (mmHG) +----------+--------+--------+------------+-------------------+ Subclavian                Not assessed                    +----------+--------+--------+------------+-------------------+  +---------+--------+--+--------+--+ VertebralPSV cm/s46EDV cm/s16 +---------+--------+--+--------+--+       Summary: Right Carotid: Cannot rule out CCA occlusion, retrograde flow of the ECA into                the ICA, and  occlusion of the ICA.  Left Carotid: At least 40-59% ICA stenosis. Higher velocities may be obtained if               patient was cooperative.  Vertebrals:  Bilateral vertebral arteries demonstrate antegrade flow. Subclavians: Not assessed.  *See table(s) above for measurements and observations.   Suggest Peripheral Vascular Consult. Repeat Carotid duplex/CTA of neck when patient is cooperative could better interrogate the carotid arteries.       Preliminary   ECHOCARDIOGRAM LIMITED BUBBLE STUDY    ECHOCARDIOGRAM LIMITED REPORT       Patient Name:   Francisco [name] Date of Exam: 02/14/2023 Medical Rec #:  [id number]   Height:       72.0 in Accession #:    [id number]  Weight:       172.0 lb Date of Birth:  [birth year]   BSA:          1.998 m Patient Age:    62 years    BP:           123/79 mmHg Patient Gender: M           HR:           69 bpm. Exam Location:  Inpatient  Procedure: Saline Contrast Bubble Study and Limited Echo  Indications:    Stroke I63.9   History:        Patient has prior history of Echocardiogram examinations, most                 recent 02/07/2023. Cardiomyopathy, CAD, Stroke, COPD and TIA;                 Risk Factors:Current Smoker and Hypertension.   Sonographer:    Dondra Prader RVT RCS Referring Phys: [id number] [facility] ([facility]  IMPRESSIONS   1. Left ventricular ejection fraction, by estimation, is 55 to 60%. The left ventricle has normal function.  2. Right ventricular systolic function is normal. The right ventricular size is normal.  3. Agitated saline contrast bubble study was negative, with no evidence of any interatrial shunt.  FINDINGS  Left Ventricle: Left ventricular ejection fraction, by estimation, is 55 to 60%. The left ventricle has normal function. There is no left ventricular hypertrophy.  Right Ventricle: The right ventricular size is normal. No increase in right ventricular wall thickness. Right ventricular systolic function is  normal.  IAS/Shunts: Agitated saline contrast was given intravenously to evaluate for intracardiac shunting. Agitated saline contrast bubble study was negative, with no evidence of any interatrial shunt.  Clearnce Hasten Electronically signed by Clearnce Hasten Signature Date/Time: 02/14/2023/12:48:54 PM      Final   MR ANGIO HEAD WO CONTRAST CLINICAL DATA:  Left-sided weakness  EXAM: MRI HEAD WITHOUT CONTRAST  MRA HEAD WITHOUT CONTRAST  TECHNIQUE: Multiplanar, multi-echo pulse sequences of the brain and surrounding structures were acquired without intravenous contrast. Angiographic images of the Circle of Willis were acquired using MRA technique  Head: Normocephalic.  Eyes:     General: No visual field deficit.    Extraocular Movements: Extraocular movements intact.     Conjunctiva/sclera: Conjunctivae normal.     Pupils: Pupils are equal, round, and reactive to light.  Cardiovascular:     Rate and Rhythm: Regular rhythm. Tachycardia present.  Pulmonary:     Effort: Pulmonary effort is normal.  Abdominal:     General: Abdomen is flat.     Palpations: Abdomen is soft.  Musculoskeletal:        General: Normal range of motion.     Cervical back: Normal range of motion. No tenderness.  Neurological:     General: No focal deficit present.     Mental Status: He is alert. Mental status is at baseline.     Cranial Nerves: Cranial nerves 2-12 are intact. No cranial nerve deficit, dysarthria or facial asymmetry.     Sensory: Sensation is intact.     Motor: Motor function is intact.     Coordination: Coordination is intact. Finger-Nose-Finger Test normal.  Psychiatric:        Mood and Affect: Mood normal.        Behavior: Behavior is agitated.     Pertinent Labs, Studies, and Procedures:     Latest Ref Rng & Units 02/14/2023    3:49 AM 02/13/2023    8:23 PM 02/13/2023    8:20 PM  CBC  WBC 4.0 - 10.5 K/uL 9.3   14.0   Hemoglobin 13.0 - 17.0 g/dL 62.9  52.8  41.3   Hematocrit 39.0 - 52.0 % 41.6  42.0  42.1   Platelets 150 - 400 K/uL 239   236        Latest Ref Rng & Units 02/14/2023    3:49 AM 02/13/2023    8:23 PM 02/13/2023    8:20 PM  CMP  Glucose 70 - 99 mg/dL 244  96  010    BUN 8 - 23 mg/dL 23  21  19    Creatinine 0.61 - 1.24 mg/dL 2.72  5.36  6.44   Sodium 135 - 145 mmol/L 140  140  138   Potassium 3.5 - 5.1 mmol/L 4.0  3.4  3.4   Chloride 98 - 111 mmol/L 100  98  99   CO2 22 - 32 mmol/L 27   30   Calcium 8.9 - 10.3 mg/dL 8.8   8.8   Total Protein 6.5 - 8.1 g/dL 5.5   5.6   Total Bilirubin 0.3 - 1.2 mg/dL 0.9   1.0   Alkaline Phos 38 - 126 U/L 50   49   AST 15 - 41 U/L 52   54   ALT 0 - 44 U/L 146   147    VAS US CAROTID Carotid Arterial Duplex Study  Patient Name:  Francisco [name]  Date of Exam:   02/14/2023 Medical Rec #: [id number]    Accession #:    [id number] Date of Birth: 03-05-1961    Patient Gender: M Patient Age:   62 years Exam Location:  [facility] Procedure:      VAS US CAROTID Referring Phys: Terrilee Files [facility]  --------------------------------------------------------------------------------   Indications:       TIA. Risk Factors:      Hypertension, hyperlipidemia, current smoker, coronary artery                    disease. Other Factors:     Dilated cardiomyopathy. Heart failure. COPD.  PSV cm/sEDV cm/sDescribe    Arm Pressure (mmHG) +----------+--------+--------+------------+-------------------+ Subclavian                Not assessed                    +----------+--------+--------+------------+-------------------+  +---------+--------+--+--------+--+ VertebralPSV cm/s46EDV cm/s16 +---------+--------+--+--------+--+       Summary: Right Carotid: Cannot rule out CCA occlusion, retrograde flow of the ECA into                the ICA, and  occlusion of the ICA.  Left Carotid: At least 40-59% ICA stenosis. Higher velocities may be obtained if               patient was cooperative.  Vertebrals:  Bilateral vertebral arteries demonstrate antegrade flow. Subclavians: Not assessed.  *See table(s) above for measurements and observations.   Suggest Peripheral Vascular Consult. Repeat Carotid duplex/CTA of neck when patient is cooperative could better interrogate the carotid arteries.       Preliminary   ECHOCARDIOGRAM LIMITED BUBBLE STUDY    ECHOCARDIOGRAM LIMITED REPORT       Patient Name:   Francisco [name] Date of Exam: 02/14/2023 Medical Rec #:  [id number]   Height:       72.0 in Accession #:    [id number]  Weight:       172.0 lb Date of Birth:  [birth year]   BSA:          1.998 m Patient Age:    62 years    BP:           123/79 mmHg Patient Gender: M           HR:           69 bpm. Exam Location:  Inpatient  Procedure: Saline Contrast Bubble Study and Limited Echo  Indications:    Stroke I63.9   History:        Patient has prior history of Echocardiogram examinations, most                 recent 02/07/2023. Cardiomyopathy, CAD, Stroke, COPD and TIA;                 Risk Factors:Current Smoker and Hypertension.   Sonographer:    Dondra Prader RVT RCS Referring Phys: [id number] [facility] ([facility]  IMPRESSIONS   1. Left ventricular ejection fraction, by estimation, is 55 to 60%. The left ventricle has normal function.  2. Right ventricular systolic function is normal. The right ventricular size is normal.  3. Agitated saline contrast bubble study was negative, with no evidence of any interatrial shunt.  FINDINGS  Left Ventricle: Left ventricular ejection fraction, by estimation, is 55 to 60%. The left ventricle has normal function. There is no left ventricular hypertrophy.  Right Ventricle: The right ventricular size is normal. No increase in right ventricular wall thickness. Right ventricular systolic function is  normal.  IAS/Shunts: Agitated saline contrast was given intravenously to evaluate for intracardiac shunting. Agitated saline contrast bubble study was negative, with no evidence of any interatrial shunt.  Clearnce Hasten Electronically signed by Clearnce Hasten Signature Date/Time: 02/14/2023/12:48:54 PM      Final   MR ANGIO HEAD WO CONTRAST CLINICAL DATA:  Left-sided weakness  EXAM: MRI HEAD WITHOUT CONTRAST  MRA HEAD WITHOUT CONTRAST  TECHNIQUE: Multiplanar, multi-echo pulse sequences of the brain and surrounding structures were acquired without intravenous contrast. Angiographic images of the Circle of Willis were acquired using MRA technique  Head: Normocephalic.  Eyes:     General: No visual field deficit.    Extraocular Movements: Extraocular movements intact.     Conjunctiva/sclera: Conjunctivae normal.     Pupils: Pupils are equal, round, and reactive to light.  Cardiovascular:     Rate and Rhythm: Regular rhythm. Tachycardia present.  Pulmonary:     Effort: Pulmonary effort is normal.  Abdominal:     General: Abdomen is flat.     Palpations: Abdomen is soft.  Musculoskeletal:        General: Normal range of motion.     Cervical back: Normal range of motion. No tenderness.  Neurological:     General: No focal deficit present.     Mental Status: He is alert. Mental status is at baseline.     Cranial Nerves: Cranial nerves 2-12 are intact. No cranial nerve deficit, dysarthria or facial asymmetry.     Sensory: Sensation is intact.     Motor: Motor function is intact.     Coordination: Coordination is intact. Finger-Nose-Finger Test normal.  Psychiatric:        Mood and Affect: Mood normal.        Behavior: Behavior is agitated.     Pertinent Labs, Studies, and Procedures:     Latest Ref Rng & Units 02/14/2023    3:49 AM 02/13/2023    8:23 PM 02/13/2023    8:20 PM  CBC  WBC 4.0 - 10.5 K/uL 9.3   14.0   Hemoglobin 13.0 - 17.0 g/dL 62.9  52.8  41.3   Hematocrit 39.0 - 52.0 % 41.6  42.0  42.1   Platelets 150 - 400 K/uL 239   236        Latest Ref Rng & Units 02/14/2023    3:49 AM 02/13/2023    8:23 PM 02/13/2023    8:20 PM  CMP  Glucose 70 - 99 mg/dL 244  96  010    BUN 8 - 23 mg/dL 23  21  19    Creatinine 0.61 - 1.24 mg/dL 2.72  5.36  6.44   Sodium 135 - 145 mmol/L 140  140  138   Potassium 3.5 - 5.1 mmol/L 4.0  3.4  3.4   Chloride 98 - 111 mmol/L 100  98  99   CO2 22 - 32 mmol/L 27   30   Calcium 8.9 - 10.3 mg/dL 8.8   8.8   Total Protein 6.5 - 8.1 g/dL 5.5   5.6   Total Bilirubin 0.3 - 1.2 mg/dL 0.9   1.0   Alkaline Phos 38 - 126 U/L 50   49   AST 15 - 41 U/L 52   54   ALT 0 - 44 U/L 146   147    VAS US CAROTID Carotid Arterial Duplex Study  Patient Name:  Francisco [name]  Date of Exam:   02/14/2023 Medical Rec #: [id number]    Accession #:    [id number] Date of Birth: 03-05-1961    Patient Gender: M Patient Age:   62 years Exam Location:  [facility] Procedure:      VAS US CAROTID Referring Phys: Terrilee Files [facility]  --------------------------------------------------------------------------------   Indications:       TIA. Risk Factors:      Hypertension, hyperlipidemia, current smoker, coronary artery                    disease. Other Factors:     Dilated cardiomyopathy. Heart failure. COPD.  Head: Normocephalic.  Eyes:     General: No visual field deficit.    Extraocular Movements: Extraocular movements intact.     Conjunctiva/sclera: Conjunctivae normal.     Pupils: Pupils are equal, round, and reactive to light.  Cardiovascular:     Rate and Rhythm: Regular rhythm. Tachycardia present.  Pulmonary:     Effort: Pulmonary effort is normal.  Abdominal:     General: Abdomen is flat.     Palpations: Abdomen is soft.  Musculoskeletal:        General: Normal range of motion.     Cervical back: Normal range of motion. No tenderness.  Neurological:     General: No focal deficit present.     Mental Status: He is alert. Mental status is at baseline.     Cranial Nerves: Cranial nerves 2-12 are intact. No cranial nerve deficit, dysarthria or facial asymmetry.     Sensory: Sensation is intact.     Motor: Motor function is intact.     Coordination: Coordination is intact. Finger-Nose-Finger Test normal.  Psychiatric:        Mood and Affect: Mood normal.        Behavior: Behavior is agitated.     Pertinent Labs, Studies, and Procedures:     Latest Ref Rng & Units 02/14/2023    3:49 AM 02/13/2023    8:23 PM 02/13/2023    8:20 PM  CBC  WBC 4.0 - 10.5 K/uL 9.3   14.0   Hemoglobin 13.0 - 17.0 g/dL 62.9  52.8  41.3   Hematocrit 39.0 - 52.0 % 41.6  42.0  42.1   Platelets 150 - 400 K/uL 239   236        Latest Ref Rng & Units 02/14/2023    3:49 AM 02/13/2023    8:23 PM 02/13/2023    8:20 PM  CMP  Glucose 70 - 99 mg/dL 244  96  010    BUN 8 - 23 mg/dL 23  21  19    Creatinine 0.61 - 1.24 mg/dL 2.72  5.36  6.44   Sodium 135 - 145 mmol/L 140  140  138   Potassium 3.5 - 5.1 mmol/L 4.0  3.4  3.4   Chloride 98 - 111 mmol/L 100  98  99   CO2 22 - 32 mmol/L 27   30   Calcium 8.9 - 10.3 mg/dL 8.8   8.8   Total Protein 6.5 - 8.1 g/dL 5.5   5.6   Total Bilirubin 0.3 - 1.2 mg/dL 0.9   1.0   Alkaline Phos 38 - 126 U/L 50   49   AST 15 - 41 U/L 52   54   ALT 0 - 44 U/L 146   147    VAS US CAROTID Carotid Arterial Duplex Study  Patient Name:  Francisco [name]  Date of Exam:   02/14/2023 Medical Rec #: [id number]    Accession #:    [id number] Date of Birth: 03-05-1961    Patient Gender: M Patient Age:   62 years Exam Location:  [facility] Procedure:      VAS US CAROTID Referring Phys: Terrilee Files [facility]  --------------------------------------------------------------------------------   Indications:       TIA. Risk Factors:      Hypertension, hyperlipidemia, current smoker, coronary artery                    disease. Other Factors:     Dilated cardiomyopathy. Heart failure. COPD.

## 2023-02-14 NOTE — ED Notes (Signed)
Resident physician, Dr.Donovan Birdena Crandall, at bedside.

## 2023-02-14 NOTE — Hospital Course (Addendum)
Francisco [name] is a 62 y.o. person living with a history of COPD, tobacco use disorder, HTN, HLD, and CAD s/p PCI who presented with left sided weakness and admitted for TIA on hospital day 0   Transient ischemic attack Code stroke called as patient presented with left upper and lower extremity weakness with word finding difficulty. Neuro exam unremarkable, but symptoms are concerning for an ischemic event. CT head negative in the ED and MRI also negative for acute infarction. Lipid panel did reveal an LDL of 71 and A1c slightly elevated to 6.2%. Other risk factors include history of CAD and polysubstance use disorder (urine tox positive for benzos as expected and amphetamines). Although acute infarction was ruled out with MRI, symptoms are still concerning for TIA/transient cerebral ischemia. Additionally, MRA showed diminished flow of R ICA but no large vessel occlusion. Neuro also recommended carotid ultrasound and echo. ECHO showed a EF of 55-60% w/ normal LV function.   Francisco [name] was extremely impatient during his carotid ultrasound and would only adhere to a hurried study. During assessment, the sonographer was unable able to interrogate the vessels completely. It was suspected, as though the right ICA may be occludedf, but the ECA has reversed flow and is now acting like the ICA and the left ICA also has stenosis, but I was unable to get adequate velocities or pictures. During the study he has an internal medicine appointment coming up and it was suggested he speak with the provider about it. A repeat carotid duplex and CTA of the neck would be helpful to evaluate ICA stenosis/occlusion.   Patient returned from vascular and echo agitated, demanding to leave, removing himself from monitor and demanding that PIV be removed. Attempts to verbally de-escalate somewhat successful and patient willing to wait ten minutes for physician to see him.   He was instructed to continue to take ASA 81 mg daily and  plavix 75 mg daily x 21 days. Afterwards, he was instructed to transition to aspirin monotherapy thereafter.   Prior to discharge, discussed the recommendation of awaiting formal studies to result and the patient declined. Reported that he needed to leave due to family matters and his ride was waiting on him. Patient was de-escalated however, eventually took at his IV due to impatience and strong desire to leave.  Recommended he await his medications to be delivered bedside and he requested that they be sent to his preferred pharmacy. He voiced understanding and would follow with his primary care physician at his appointment on 02/17/2023.    AKI  Patient was admitted with an elevated Cr of 1.3. Patients baseline was ~0.6-0.7 prior to admission. He received a 1 L LR bolus prior to discharge.   Anxiety Agitation Patient was previously discharged on 10/25. At that time, he was discharged on a klonopin taper of 1 mg BID x 5 days (day 3/5 for this currently), followed by 0.5 mg BID x 5 days, and then 0.25 mg BID x 5 days. Resumed taper. Seroquel was discontinued at time of discharge. It was primarily prescribed for hospital agitation.   On day of discharge, patient returned from vascular and echo agitated, demanding to leave, removing himself from monitor and demanding that PIV be removed. Attempts to verbally de-escalate somewhat successful and patient willing to wait ten minutes for physician to see him.   COPD  Tobacco use disorder The patient was recently admitted from 10/15-10/25 for COPD exacerbation. He was discharged to [facility] due to continued supplemental O2  needs but left AMA. Upon admission, patient was satting well on room air above goal of 88-92%. He was managed on dulera 2 puffs BID and Incruse ellipta 1 puff daily.   Duonebs q4h PRN for wheezing, SOB. Nicotine patch was offered. Patient was satting well on room air to 97 prior to discharge.    Hypertension Combined systolic and  diastolic CHF At home the patient is on amlodipine 5 mg daily, lisinopril 20 mg daily, and metoprolol 25 mg BID. Home meds were held in the setting of permissive hypertension per neurology. Repeat ECHO showed a EF of 55-60% w/ normal LV function.  Blood pressure was well controlled at time of discharge and he was hemodynamically stable.      Principal Problem:   TIA (transient ischemic attack)  Resolved Problems:   * No resolved hospital problems. *  Consults: -neurology: ASA 81 mg and Plavix 75 mg daily for 21 days. Monotherapy with ASA afterwards.   Procedures:  ECHO  VAS US Carotid   Follow-up items: -f/u echo/carotid results

## 2023-02-14 NOTE — Progress Notes (Signed)
VASCULAR LAB    Carotid duplex has been performed.  See CV proc for preliminary results.   Alcee Sipos, RVT 02/14/2023, 1:37 PM

## 2023-02-14 NOTE — ED Notes (Signed)
Late entry due to direct patient care. Patient returned from vascular and echo agitated, demanding to leave, removing himself from monitor and demanding that PIV be removed. Attempts to verbally de-escalate somewhat successful and patient willing to wait ten minutes for physician to see him. Patient refusing vital signs and NIH assessment upon return from imaging.

## 2023-02-14 NOTE — ED Notes (Signed)
Patient at vascular.

## 2023-02-14 NOTE — H&P (Incomplete)
Date: 02/14/2023               Patient Name:  Francisco [name] MRN: [id number]  DOB: [birth year] Age / Sex: 62 y.o., male   PCP: Olegario Messier, MD         Medical Service: Internal Medicine Teaching Service         Attending Physician: Dr. Inez Catalina, MD      First Contact: Dr. Peterson Ao, MD Pager [phone]    Second Contact: Dr. Morene Crocker, MD Pager [phone]         After Hours (After 5p/  First Contact Pager: [phone]  weekends / holidays): Second Contact Pager: [phone]   SUBJECTIVE   Chief Complaint: weakness  History of Present Illness: Francisco [name] is a 62 yo male with COPD, tobacco use disorder, HTN, HLD, and CAPD s/p PCI who presents with left upper and lower extremity weakness.  Of note, the patient was recently hospitalized from 10/15-10/25 for acute hypoxemic and hypercapnic respiratory failure secondary to a COPD exacerbation. His hospital stay was complicated by two transfers to the ICU due to agitation on BiPAP, but after antibiotics and steroids were started, he was able to be transferred back to the floors.   1500 yesterday. Left upper and lower extremity weakness.    Meds:  Albuterol Amlodipine 5 mg daily Aspirin 81 mg daily Atorvastatin 40 mg daily Breztri 2 puffs BID Cloazepam *** Lisinopril 20 mg daily Metoprolol 25 mg BID Prednisone *** No outpatient medications have been marked as taking for the 02/13/23 encounter [facility] Encounter).    Past Medical History  Past Surgical History:  Procedure Laterality Date  . BACK SURGERY    . NECK SURGERY    . RIGHT/LEFT HEART CATH AND CORONARY ANGIOGRAPHY N/A 04/30/2019   Procedure: RIGHT/LEFT HEART CATH AND CORONARY ANGIOGRAPHY;  Surgeon: Marykay Lex, MD;  Location: [facility] INVASIVE CV LAB;  Service: Cardiovascular;  Laterality: N/A;    Social:  Lives With: Occupation: Support: Level of Function: PCP: Substances:  Family History: ***  Allergies: Allergies as of 02/13/2023 -  Review Complete 02/13/2023  Allergen Reaction Noted  . Ivp dye [iodinated contrast media] Other (See Comments) 06/12/2018  . Tramadol Other (See Comments) 06/12/2018  . Doxycycline Other (See Comments) 02/01/2023    Review of Systems: A complete ROS was negative except as per HPI.   OBJECTIVE:   Physical Exam: Blood pressure 111/71, pulse (!) 104, temperature 97.9 F (36.6 C), temperature source Oral, resp. rate 17, height 6' (1.829 m), weight 78 kg, SpO2 93%.  Constitutional: well-appearing *** sitting in ***, in no acute distress HENT: normocephalic atraumatic, mucous membranes moist Eyes: conjunctiva non-erythematous Neck: supple Cardiovascular: regular rate and rhythm, no m/r/g Pulmonary/Chest: normal work of breathing on room air, lungs clear to auscultation bilaterally Abdominal: soft, non-tender, non-distended MSK: normal bulk and tone Neurological: alert & oriented x 3, 5/5 strength in bilateral upper and lower extremities, normal gait Skin: warm and dry Psych: ***  Labs: CBC    Component Value Date/Time   WBC 14.0 (H) 02/13/2023 2020   RBC 4.40 02/13/2023 2020   HGB 14.3 02/13/2023 2023   HGB 17.5 05/18/2021 1518   HCT 42.0 02/13/2023 2023   HCT 51.9 (H) 05/18/2021 1518   PLT 236 02/13/2023 2020   PLT 316 05/18/2021 1518   MCV 95.7 02/13/2023 2020   MCV 93 05/18/2021 1518   MCH 31.1 02/13/2023 2020   MCHC 32.5 02/13/2023 2020   RDW 13.1 02/13/2023 2020  RDW 12.1 05/18/2021 1518   LYMPHSABS 3.4 02/13/2023 2020   LYMPHSABS 1.7 05/18/2021 1518   MONOABS 1.2 (H) 02/13/2023 2020   EOSABS 0.0 02/13/2023 2020   EOSABS 0.0 05/18/2021 1518   BASOSABS 0.0 02/13/2023 2020   BASOSABS 0.0 05/18/2021 1518     CMP     Component Value Date/Time   NA 140 02/13/2023 2023   NA 143 08/25/2022 1407   K 3.4 (L) 02/13/2023 2023   CL 98 02/13/2023 2023   CO2 30 02/13/2023 2020   GLUCOSE 96 02/13/2023 2023   BUN 21 02/13/2023 2023   BUN 16 08/25/2022 1407    CREATININE 1.20 02/13/2023 2023   CALCIUM 8.8 (L) 02/13/2023 2020   PROT 5.6 (L) 02/13/2023 2020   ALBUMIN 3.2 (L) 02/13/2023 2020   AST 54 (H) 02/13/2023 2020   ALT 147 (H) 02/13/2023 2020   ALKPHOS 49 02/13/2023 2020   BILITOT 1.0 02/13/2023 2020   GFRNONAA >60 02/13/2023 2020   GFRAA >60 01/11/2020 0422    Imaging:  EKG: personally reviewed my interpretation is***. Prior EKG***  ASSESSMENT & PLAN:   Assessment & Plan by Problem: Principal Problem:   Stroke (HCC)   Tamiko Ellington is a 62 y.o. person living with a history of *** who presented with *** and admitted for *** on hospital day 0  *** ***  *** ***  *** ***  Diet: {NAMES:3044014::"Normal","Heart Healthy","Carb-Modified","Renal","Carb/Renal","NPO","TPN","Tube Feeds"} VTE: {NAMES:3044014::"Heparin","Enoxaparin","SCDs","DOAC","None"} IVF: {NAMES:3044014::"None","NS","1/2 NS","LR","D5","D10"},{NAMES:3044014::"None","10cc/hr","25cc/hr","50cc/hr","75cc/hr","100cc/hr","110cc/hr","125cc/hr","Bolus"} Code: {NAMES:3044014::"Full","DNR","DNI","DNR/DNI","Comfort Care","Unknown"}  Prior to Admission Living Arrangement: {NAMES:3044014::"Home, living ***","SNF, ***","Homeless","***"} Anticipated Discharge Location: {NAMES:3044014::"Home","SNF","CIR","***"} Barriers to Discharge: ***  Dispo: Admit patient to {STATUS:3044014::"Observation with expected length of stay less than 2 midnights.","Inpatient with expected length of stay greater than 2 midnights."}  Signed: Chauncey Mann, DO Internal Medicine Resident PGY-3  02/14/2023, 12:01 AM

## 2023-02-14 NOTE — Progress Notes (Addendum)
Attempted to see patient, but he was discharged.  Patient had become agitated and was demanding to leave and removed his IV, refusing to wait for imaging results or his medications.  According to discharge summary, he was advised to continue on DAPT Aspirin and Plavix for 3 weeks, then just aspirin as well as Lipitor 40mg  for secondary stroke prevention.   Lynnae January, DNP, AGACNP-BC [facility] Please use AMION for contact information & EPIC for messaging.  ADDENDUM:  62 year old male with history of hypertension, hyperlipidemia, COPD, hepatitis C, smoker and substance abuse admitted for left-sided weakness and difficulty walking.  CT no acute abnormality.  MRI no acute stroke.  However, MRA showed right distal ICA and MCA M1 segment decreased flow.  Carotid Doppler showed right CCA near occlusion, right ECA retrograde flow into right ICA but with right ICA proximal occlusion.  EF 55 to 60%, no PFO.  LE venous Doppler no DVT on 10/21.  LDL 71, A1c 6.2.  UDS showed positive for benzo and amphetamine.  Creatinine 1.35.  Tachycardia, AST/ALT 50/157-> 52/146.  He is on aspirin 81 and Lipitor 40 at home.  Noted patient being discharged before seen.  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  His MRA also indicating decreased flow into the distal ICA and MCA.  Patient left his symptoms fit to the right carotid occlusion and right MCA decreased perfusion, either due to hypotension or decreased collateral flow.  Ideally patient will need to stick cerebral angiogram. He does have iodine allergy but can be done with meds preparation. Unfortunately, he asked for early discharge.  Okay with DAPT for 3 weeks and then monotherapy.  Also on Lipitor 40, given elevated LFTs due to hepatitis C, recommend close liver function monitoring with PCP.  Avoid hypotension.  Will refer to [facility] Dr. Pearlean Brownie for TIA follow-up.  Marvel Plan, MD PhD Stroke Neurology 02/14/2023 5:59  PM

## 2023-02-14 NOTE — Discharge Instructions (Addendum)
You were hospitalized due to left sided weakness and to rule out stroke. Thank you for allowing [place] to be part of your care.   We arranged for you to follow up at:  Internal Medicine Clinic on 10/31 at 3: 15 PM with Dr. Thomasene Ripple  Please note these changes made to your medications:   *Please START taking:  Plavix 75 mg daily and Aspirin 81 mg daily for 19 days >> THEN only continue with Asprin 81 mg daily   *Please STOP taking:  Plavix 75 mg daily after your complete your prescription   You are on a medicine called clonazepam that you will take in tapering doses until stopping completely in a few weeks. This was prescribed for anxiety but it is associated with significant side-effects including dementia in people who have had strokes, and thus should be stopped. Take in tapering dose as prescribed to prevent uncomfortable withdrawal.  Please call our clinic if you have any questions or concerns, we may be able to help and keep you from a long and expensive emergency room wait. Our clinic and after hours phone number is [phone], the best time to call is Monday through Friday 9 am to 4 pm but there is always someone available 24/7 if you have an emergency. If you need medication refills please notify your pharmacy one week in advance and they will send [place] a request.

## 2023-02-14 NOTE — Progress Notes (Signed)
*  PRELIMINARY RESULTS* Echocardiogram 2D Echocardiogram has been performed.  Francisco [name] 02/14/2023, 12:08 PM

## 2023-02-14 NOTE — Evaluation (Signed)
Physical Therapy Brief Evaluation and Discharge Note Patient Details Name: Francisco [name] MRN: [id number] DOB: Nov 11, 1960 Today's Date: 02/14/2023   History of Present Illness  Pt is a 63 yo male admitted on 10/27 with concern for L side weakness and word finding deficits. Pt with possible TIA.  PMH: COPD, HTN, HLD, Hep C, IVDU, was admitted mid Oct for COPD exacerbation and d/c'd to [facility] until from which he left AMA.  Clinical Impression  Pt doing well with mobility and no further PT needed.  Ready for dc from PT standpoint.        PT Assessment Patient does not need any further PT services  Assistance Needed at Discharge       Equipment Recommendations None recommended by PT  Recommendations for Other Services       Precautions/Restrictions Precautions Precautions: None Precaution Comments: Larey Seat one time this new L side weakness. Restrictions Weight Bearing Restrictions: No        Mobility  Bed Mobility       General bed mobility comments: Pt up in chair  Transfers Overall transfer level: Modified independent Equipment used: None Transfers: Sit to/from Stand Sit to Stand: Modified independent (Device/Increase time)           General transfer comment: Incr time and use of UE's    Ambulation/Gait Ambulation/Gait assistance: Independent Gait Distance (Feet): 600 Feet Assistive device: None Gait Pattern/deviations: Step-through pattern, Decreased stride length Gait Speed: Below normal General Gait Details: Steady gait. Self selected slower speed. When prompted to incr speed pt with incr gait fluidity and no instability  Home Activity Instructions    Stairs            Modified Rankin (Stroke Patients Only)        Balance Overall balance assessment: Modified Independent Sitting-balance support: No upper extremity supported, Feet supported Sitting balance-Leahy Scale: Normal     Standing balance support: No upper extremity supported, During  functional activity Standing balance-Leahy Scale: Good Standing balance comment: Pt performed amb with head turns, speed changes, stop and turns, and step overs without difficulty or loss of balance. Able to walk backwards and pick up object from floor.          Pertinent Vitals/Pain   Pain Assessment Pain Assessment: No/denies pain     Home Living Family/patient expects to be discharged to:: Private residence Living Arrangements: Non-relatives/Friends Available Help at Discharge: Available PRN/intermittently Home Environment: Stairs to enter  [place] of Steps: 10 Home Equipment: None   Additional Comments: Pt states he no longer has O2 at home.  Roommate ist there most of the time he is there.    Prior Function Level of Independence: Independent      UE/LE Assessment   UE ROM/Strength/Tone/Coordination:  (Defer to OT)    LE ROM/Strength/Tone/Coordination: [facility]      Communication   Communication Communication: No apparent difficulties     Cognition Overall Cognitive Status: Appears within functional limits for tasks assessed/performed (baseline impulsiveness)       General Comments General comments (skin integrity, edema, etc.): Pt overall is at mod I level with very mild balance deficits noted when walking in hallway although no assist needed. Spoke with PT about findings but do not feel pt needs OT at this time. Pt was educated re: signs and symptoms of CVA and energy conservation techinques.    Exercises     Assessment/Plan    PT Problem List         PT Visit  Diagnosis Unsteadiness on feet (R26.81)    No Skilled PT Patient is modified independent with all activity/mobility;Patient at baseline level of functioning   Co-evaluation                AMPAC 6 Clicks Help needed turning from your back to your side while in a flat bed without using bedrails?: None Help needed moving from lying on your back to sitting on the side of a flat bed  without using bedrails?: None Help needed moving to and from a bed to a chair (including a wheelchair)?: None Help needed standing up from a chair using your arms (e.g., wheelchair or bedside chair)?: None Help needed to walk in hospital room?: None Help needed climbing 3-5 steps with a railing? : None 6 Click Score: 24      End of Session   Activity Tolerance: Patient tolerated treatment well Patient left: in chair;with call bell/phone within reach Nurse Communication: Mobility status PT Visit Diagnosis: Unsteadiness on feet (R26.81)     Time: 3086-5784 PT Time Calculation (min) (ACUTE ONLY): 10 min  Charges:   PT Evaluation $PT Eval Low Complexity: 1 Low      [facility] PT Acute Rehabilitation Services Office [phone]   Angelina Ok [facility]  02/14/2023, 10:53 AM

## 2023-02-14 NOTE — ED Notes (Signed)
Pt. Ambulates to restroom with steady even gait

## 2023-02-14 NOTE — Evaluation (Signed)
Occupational Therapy Evaluation and Discharge Summary Patient Details Name: Francisco [name] MRN: [id number] DOB: 1960-09-18 Today's Date: 02/14/2023   History of Present Illness Pt is a 62 yo male admitted on 10/27 with concern for L side weakness and word finding deficits. Pt with possible TIA.  PMH: COPD, HTN, HLD, Hep C, IVDU, was admitted mid Oct for COPD exacerbation and d/c'd to [facility] until from which he left AMA.   Clinical Impression   Pt admitted with the above diagnosis and overall appears to be close to baseline from OT standpoint.  Pt is mildly weak in LLE per report. Pt ambulated in hallway in in room completing all basic adls without assist. Pt lives with roommate and just left [facility] until AMA 2 days ago after being in hospital for COPD exacerbation with ICU stay. Pt is impulsive at times but feel this may be his personality/lifestyle. Pt answered memory, safety and orientation questions correctly. Pt does not drive but previously was independent prior to mid Oct visit. Feel pt is close to baseline and could d/c home with no follow up OT services.       If plan is discharge home, recommend the following: Assist for transportation    Functional Status Assessment  Patient has not had a recent decline in their functional status  Equipment Recommendations  None recommended by OT    Recommendations for Other Services       Precautions / Restrictions Precautions Precautions: Fall Precaution Comments: Larey Seat one time this new L side weakness. Restrictions Weight Bearing Restrictions: No      Mobility Bed Mobility Overal bed mobility: Modified Independent             General bed mobility comments: no assist needed    Transfers Overall transfer level: Modified independent Equipment used: None               General transfer comment: No physical assist needed.  Assist given for IV pole. Pt could push IV pole in hallway.      Balance Overall balance assessment:  Mild deficits observed, not formally tested Sitting-balance support: Feet supported Sitting balance-Leahy Scale: Good     Standing balance support: No upper extremity supported Standing balance-Leahy Scale: Fair Standing balance comment: no outside support needed                           ADL either performed or assessed with clinical judgement   ADL Overall ADL's : At baseline                                       General ADL Comments: Pt completes all adls with mod I. Pt does have some mild LLE weakness but is safe on his feet in room and on short walk in hallway. Pt does appear unsteady on feet at times but did not lose balance.     Vision Baseline Vision/History: 1 Wears glasses Ability to See in Adequate Light: 0 Adequate Patient Visual Report: No change from baseline Vision Assessment?: No apparent visual deficits     Perception Perception: Within Functional Limits       Praxis Praxis: WFL       Pertinent Vitals/Pain Pain Assessment Pain Assessment: No/denies pain     Extremity/Trunk Assessment Upper Extremity Assessment Upper Extremity Assessment: LUE deficits/detail LUE Deficits / Details: Mildly weak compared to the  R but functional. LUE Sensation: WNL LUE Coordination: WNL   Lower Extremity Assessment Lower Extremity Assessment: Defer to PT evaluation   Cervical / Trunk Assessment Cervical / Trunk Assessment: Normal;Back Surgery   Communication Communication Communication: No apparent difficulties   Cognition Arousal: Alert Behavior During Therapy: Impulsive Overall Cognitive Status: No family/caregiver present to determine baseline cognitive functioning                                 General Comments: Pt appears to be impulsive at baseline with his personality and life choices. Memory, problem solving, orientation all intact. Pt is unsafe in room in regards to lines but otherwise appears to be safe when  lines are disconnected.     General Comments  Pt overall is at mod I level with very mild balance deficits noted when walking in hallway although no assist needed. Spoke with PT about findings but do not feel pt needs OT at this time. Pt was educated re: signs and symptoms of CVA and energy conservation techinques.    Exercises     Shoulder Instructions      Home Living Family/patient expects to be discharged to:: Private residence Living Arrangements: Non-relatives/Friends Available Help at Discharge: Friend(s);Available PRN/intermittently Type of Home: House Home Access: Stairs to enter;Ramped entrance Entrance Stairs-Number of Steps: 10 3 with rail, then 1 then 2 then 3 more with rail but has a ramp   Home Layout: One level     Bathroom Shower/Tub: Producer, television/film/video: Standard     Home Equipment: None   Additional Comments: Pt states he no longer has O2 at home.  Roommate ist there most of the time he is there.      Prior Functioning/Environment Prior Level of Function : Independent/Modified Independent             Mobility Comments: no assist PTA ADLs Comments: independent but does not drive        OT Problem List:        OT Treatment/Interventions:      OT Goals(Current goals can be found in the care plan section) Acute Rehab OT Goals Patient Stated Goal: to go home  OT Frequency: Min 1X/week    Co-evaluation              AM-PAC OT "6 Clicks" Daily Activity     Outcome Measure Help from another person eating meals?: None Help from another person taking care of personal grooming?: None Help from another person toileting, which includes using toliet, bedpan, or urinal?: None Help from another person bathing (including washing, rinsing, drying)?: None Help from another person to put on and taking off regular upper body clothing?: None Help from another person to put on and taking off regular lower body clothing?: None 6 Click Score:  24   End of Session Nurse Communication: Mobility status  Activity Tolerance: Patient tolerated treatment well Patient left: with call bell/phone within reach;in chair  OT Visit Diagnosis: Unsteadiness on feet (R26.81)                Time: 6213-0865 OT Time Calculation (min): 21 min Charges:  OT General Charges $OT Visit: 1 Visit OT Evaluation $OT Eval Low Complexity: 1 Low  Hope Budds 02/14/2023, 10:15 AM

## 2023-02-15 ENCOUNTER — Other Ambulatory Visit: Payer: Self-pay | Admitting: Student

## 2023-02-15 ENCOUNTER — Other Ambulatory Visit (HOSPITAL_COMMUNITY): Payer: Self-pay

## 2023-02-15 MED ORDER — ALBUTEROL SULFATE HFA 108 (90 BASE) MCG/ACT IN AERS
2.0000 | INHALATION_SPRAY | RESPIRATORY_TRACT | 2 refills | Status: DC | PRN
  Filled 2023-02-15: qty 6.7, 25d supply, fill #0

## 2023-02-15 NOTE — Telephone Encounter (Signed)
Next appt scheduled 10/31 with PCP. 

## 2023-02-16 ENCOUNTER — Other Ambulatory Visit (HOSPITAL_COMMUNITY): Payer: Self-pay

## 2023-02-17 ENCOUNTER — Ambulatory Visit (INDEPENDENT_AMBULATORY_CARE_PROVIDER_SITE_OTHER): Payer: No Typology Code available for payment source | Admitting: Student

## 2023-02-17 ENCOUNTER — Other Ambulatory Visit (HOSPITAL_COMMUNITY): Payer: Self-pay

## 2023-02-17 VITALS — BP 148/97 | HR 105 | Temp 98.6°F | Ht 72.0 in | Wt 173.2 lb

## 2023-02-17 DIAGNOSIS — J449 Chronic obstructive pulmonary disease, unspecified: Secondary | ICD-10-CM

## 2023-02-17 DIAGNOSIS — J441 Chronic obstructive pulmonary disease with (acute) exacerbation: Secondary | ICD-10-CM

## 2023-02-17 DIAGNOSIS — F411 Generalized anxiety disorder: Secondary | ICD-10-CM | POA: Diagnosis not present

## 2023-02-17 DIAGNOSIS — F17210 Nicotine dependence, cigarettes, uncomplicated: Secondary | ICD-10-CM

## 2023-02-17 DIAGNOSIS — G459 Transient cerebral ischemic attack, unspecified: Secondary | ICD-10-CM

## 2023-02-17 MED ORDER — PULMICORT FLEXHALER 90 MCG/ACT IN AEPB
1.0000 | INHALATION_SPRAY | Freq: Two times a day (BID) | RESPIRATORY_TRACT | 6 refills | Status: DC
  Filled 2023-02-17: qty 1, 30d supply, fill #0
  Filled 2023-04-14: qty 1, 30d supply, fill #1
  Filled 2023-06-08 – 2023-07-15 (×3): qty 1, 30d supply, fill #2

## 2023-02-17 MED ORDER — HYDROXYZINE HCL 10 MG PO TABS
10.0000 mg | ORAL_TABLET | Freq: Three times a day (TID) | ORAL | 0 refills | Status: DC | PRN
  Filled 2023-02-17: qty 60, 20d supply, fill #0

## 2023-02-17 MED ORDER — ALBUTEROL SULFATE HFA 108 (90 BASE) MCG/ACT IN AERS
2.0000 | INHALATION_SPRAY | RESPIRATORY_TRACT | 2 refills | Status: DC | PRN
  Filled 2023-02-17: qty 6.7, 25d supply, fill #0
  Filled 2023-03-10: qty 6.7, 25d supply, fill #1
  Filled 2023-04-01: qty 6.7, 25d supply, fill #2

## 2023-02-17 MED ORDER — MOMETASONE FURO-FORMOTEROL FUM 200-5 MCG/ACT IN AERO
2.0000 | INHALATION_SPRAY | Freq: Every day | RESPIRATORY_TRACT | 6 refills | Status: DC
  Filled 2023-02-17: qty 13, 30d supply, fill #0
  Filled 2023-03-24: qty 13, 30d supply, fill #1
  Filled 2023-04-28: qty 13, 30d supply, fill #2

## 2023-02-17 NOTE — Patient Instructions (Addendum)
Thank you so much for coming to the clinic today!   I am sending in an inhaler called Pulmicort, please take it with your dulera. I am also placing a referral for a lung doctor and pulmonary rehab. I have also sent in the anxiety medication, which you can take maximum three times a day as needed.   If you have any questions please feel free to the call the clinic at anytime at [phone]. It was a pleasure seeing you!  Best, Dr. Thomasene Ripple

## 2023-02-18 NOTE — Assessment & Plan Note (Signed)
>>  ASSESSMENT AND PLAN FOR COPD (CHRONIC OBSTRUCTIVE PULMONARY DISEASE) (HCC) WRITTEN ON 02/18/2023  5:17 PM BY NELIA DIRKS, MD  Patient presents for follow-up from his hospitalization for acute hypoxic respiratory failure secondary to a COPD exacerbation from 02/01/2023 to 02/11/2023.  He states overall he was doing well, however can only walk 50 feet without feeling winded.  He was discharged on Breztri  however was not able to get this inhaler.  His current inhaler is Dulera  which she has been using, and his albuterol  for rescue.  He is currently still smoking, and states that after he finishes this pack of cigarettes he will quit.  He is interested in nicotine  patches at this time and states he will buy them over-the-counter.  On my auscultation, I do not hear any wheezes.   Overall, this is a patient with a very likely COPD (no formal PFTs done), who would benefit from triple therapy regarding his inhalers as well as pulmonary rehab.  Plan: - Continue Dulera , will add on ICS (Pulmicort ) - Will send referral to pulmonary rehab - Continue discussions about cigarette smoking cessation

## 2023-02-18 NOTE — Assessment & Plan Note (Signed)
Patient states that since his hospitalization in the ICU, and his most recent visit to the emergency department has had his anxiety at an all-time high.  He was prescribed a small tapering dose of Klonopin when he was discharged from the hospital, however was unable to pick it up.  At this time, I do not think Klonopin is the right antianxiety medication for him given his severe respiratory issues, he is amenable to trying hydroxyzine as needed.

## 2023-02-18 NOTE — Assessment & Plan Note (Signed)
Patient also presents for follow-up from the emergency department where he noticed he was having slurred speech, his left leg felt like "100 pounds".  He intermittently feels pins-and-needles in his left hand as well as his left leg.  He was worked up in the emergency department and a code stroke was called.  All imaging was negative, however they did treat him with a Plavix load and told him to continue taking aspirin.  On my exam, I did not appreciate any weaknesses in any of his extremities, cranial nerves are also intact.  Etiology currently unclear, however this is being treated as a TIA as patient does have significant risk factors such as cigarette use and also high blood pressure.  The symptoms have not returned since, and there is a referral has been placed to neurology.  Will focus on risk factor reduction including managing his blood pressure as well as discussing tobacco cessation.

## 2023-02-18 NOTE — Progress Notes (Signed)
CC: Hospital follow up  HPI:  [name] is a 62 y.o. male living with a history stated below and presents today for a hospital follow up. Please see problem based assessment and plan for additional details.  Past Medical History:  Diagnosis Date   CHF (congestive heart failure) (HCC)    COPD (chronic obstructive pulmonary disease) (HCC)    COPD with acute exacerbation (HCC) 10/21/2021   Coronary artery disease    a. s/p prior PCI.   Hepatitis-C    Hypertension    IV drug abuse (HCC)    a. 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   Medically noncompliant    MI (myocardial infarction) (HCC)    Tobacco abuse     Current Outpatient Medications on File Prior to Visit  Medication Sig Dispense Refill   albuterol (PROVENTIL) (2.5 MG/3ML) 0.083% nebulizer solution Use 1 vial (2.5 mg total) by nebulization every 6 (six) hours as needed for wheezing or shortness of breath. 300 mL 2   amLODipine (NORVASC) 5 MG tablet Take 1 tablet (5 mg total) by mouth daily. 30 tablet 2   aspirin EC 81 MG tablet Take 1 tablet (81 mg total) by mouth daily. Swallow whole. 30 tablet 0   atorvastatin (LIPITOR) 40 MG tablet Take 1 tablet (40 mg total) by mouth daily. 30 tablet 2   clopidogrel (PLAVIX) 75 MG tablet Take 1 tablet (75 mg total) by mouth daily for 19 days. Complete medications until finished. 19 tablet 0   lidocaine (LIDODERM) 5 % Place 2 patches onto the skin daily. Remove & Discard patch within 12 hours or as directed by MD (Patient not taking: Reported on 02/14/2023)     lisinopril (ZESTRIL) 40 MG tablet Take 1/2 tablet (20 mg total) by mouth daily. 90 tablet 3   metoprolol tartrate (LOPRESSOR) 25 MG tablet Take 1 tablet (25 mg total) by mouth 2 (two) times daily.     nicotine (NICODERM CQ - DOSED IN MG/24 HOURS) 14 mg/24hr patch Place 1 patch (14 mg total) onto the skin daily. (Patient not taking: Reported on 02/14/2023) 30 patch 1   nitroGLYCERIN (NITROSTAT) 0.4 MG SL tablet Place  1 tablet (0.4 mg total) under the tongue every 5 (five) minutes x 3 doses as needed for chest pain. 25 tablet 2   No current facility-administered medications on file prior to visit.    Family History  Problem Relation Age of Onset   Rheum arthritis Mother    Heart disease Paternal Grandfather     Social History   Socioeconomic History   Marital status: Divorced    Spouse name: Not on file   Number of children: Not on file   Years of education: Not on file   Highest education level: Not on file  Occupational History   Not on file  Tobacco Use   Smoking status: Every Day    Current packs/day: 0.50    Types: Cigarettes    Passive exposure: Current   Smokeless tobacco: Never  Vaping Use   Vaping status: Never Used  Substance and Sexual Activity   Alcohol use: Never   Drug use: Yes    Types: Methamphetamines    Comment: Last use 10/14 per pt   Sexual activity: Not on file  Other Topics Concern   Not on file  Social History Narrative   Not on file   Social Determinants of Health   Financial Resource Strain: Not on file  Food Insecurity: Food Insecurity Present (02/14/2023)  Hunger Vital Sign    Worried About Running Out of Food in the Last Year: Sometimes true    Ran Out of Food in the Last Year: Sometimes true  Transportation Needs: No Transportation Needs (02/14/2023)   PRAPARE - Administrator, Civil Service (Medical): No    Lack of Transportation (Non-Medical): No  Physical Activity: Not on file  Stress: Not on file  Social Connections: Not on file  Intimate Partner Violence: Not At Risk (02/14/2023)   Humiliation, Afraid, Rape, and Kick questionnaire    Fear of Current or Ex-Partner: No    Emotionally Abused: No    Physically Abused: No    Sexually Abused: No    Review of Systems: ROS negative except for what is noted on the assessment and plan.  Vitals:   02/17/23 1354 02/17/23 1436  BP: (!) 149/94 (!) 148/97  Pulse: (!) 107 (!) 105   Temp: 98.6 F (37 C)   TempSrc: Oral   SpO2: 99%   Weight: 173 lb 3.2 oz (78.6 kg)   Height: 6' (1.829 m)     Physical Exam: Constitutional: well-appearing male  in no acute distress Cardiovascular: regular rate and rhythm, no m/r/g Pulmonary/Chest: normal work of breathing on room air, lungs clear to auscultation bilaterally Abdominal: soft, non-tender, non-distended Neurological: alert & oriented x 3, 5/5 strength in bilateral upper and lower extremities, normal gait, CN 2-12 in tact Skin: warm and dry Psych: anxious mood and affect  Assessment & Plan:   COPD (chronic obstructive pulmonary disease) (HCC) Patient presents for follow-up from his hospitalization for acute hypoxic respiratory failure secondary to a COPD exacerbation from 02/01/2023 to 02/11/2023.  He states overall he was doing well, however can only walk 50 feet without feeling winded.  He was discharged on Breztri however was not able to get this inhaler.  His current inhaler is [facility] which she has been using, and his albuterol for rescue.  He is currently still smoking, and states that after he finishes this pack of cigarettes he will quit.  He is interested in nicotine patches at this time and states he will buy them over-the-counter.  On my auscultation, I do not hear any wheezes.   Overall, this is a patient with a very likely COPD (no formal PFTs done), who would benefit from triple therapy regarding his inhalers as well as pulmonary rehab.  Plan: - Continue Dulera, will add on ICS (Pulmicort) - Will send referral to pulmonary rehab - Continue discussions about cigarette smoking cessation  TIA (transient ischemic attack) Patient also presents for follow-up from the emergency department where he noticed he was having slurred speech, his left leg felt like "100 pounds".  He intermittently feels pins-and-needles in his left hand as well as his left leg.  He was worked up in the emergency department and a code  stroke was called.  All imaging was negative, however they did treat him with a Plavix load and told him to continue taking aspirin.  On my exam, I did not appreciate any weaknesses in any of his extremities, cranial nerves are also intact.  Etiology currently unclear, however this is being treated as a TIA as patient does have significant risk factors such as cigarette use and also high blood pressure.  The symptoms have not returned since, and there is a referral has been placed to neurology.  Will focus on risk factor reduction including managing his blood pressure as well as discussing tobacco cessation.  Anxiety  state Patient states that since his hospitalization in the ICU, and his most recent visit to the emergency department has had his anxiety at an all-time high.  He was prescribed a small tapering dose of Klonopin when he was discharged from the hospital, however was unable to pick it up.  At this time, I do not think Klonopin is the right antianxiety medication for him given his severe respiratory issues, he is amenable to trying hydroxyzine as needed.  Patient discussed with Dr. Maryagnes Amos Marcin [name], M.D. [facility] Internal Medicine, PGY-2 Pager: [phone] Date 02/18/2023 Time 5:28 PM

## 2023-02-18 NOTE — Assessment & Plan Note (Signed)
Patient presents for follow-up from his hospitalization for acute hypoxic respiratory failure secondary to a COPD exacerbation from 02/01/2023 to 02/11/2023.  He states overall he was doing well, however can only walk 50 feet without feeling winded.  He was discharged on Breztri however was not able to get this inhaler.  His current inhaler is [facility] which she has been using, and his albuterol for rescue.  He is currently still smoking, and states that after he finishes this pack of cigarettes he will quit.  He is interested in nicotine patches at this time and states he will buy them over-the-counter.  On my auscultation, I do not hear any wheezes.   Overall, this is a patient with a very likely COPD (no formal PFTs done), who would benefit from triple therapy regarding his inhalers as well as pulmonary rehab.  Plan: - Continue Dulera, will add on ICS (Pulmicort) - Will send referral to pulmonary rehab - Continue discussions about cigarette smoking cessation

## 2023-02-21 NOTE — Progress Notes (Signed)
 Internal Medicine Clinic Attending  Case discussed with the resident physician at the time of the visit.  We reviewed the patient's history, exam, and pertinent patient test results.  I agree with the assessment, diagnosis, and plan of care documented in the resident's note.

## 2023-02-24 ENCOUNTER — Telehealth: Payer: Self-pay | Admitting: Student

## 2023-02-24 NOTE — Telephone Encounter (Signed)
Pt c/o of hte the following the medication is not working and his anxiety is getting worse  Pt requesting a call back about what else he can take  hydrOXYzine (ATARAX) 10 MG tablet   [facility] [place]

## 2023-02-28 ENCOUNTER — Other Ambulatory Visit (HOSPITAL_COMMUNITY): Payer: Self-pay

## 2023-02-28 NOTE — Telephone Encounter (Signed)
Attempted to call pt to discuss increasing his dose, however no response. Will try and call again.

## 2023-02-28 NOTE — Telephone Encounter (Signed)
Pt is returning missed call from PCP about his meds

## 2023-03-10 ENCOUNTER — Other Ambulatory Visit: Payer: Self-pay

## 2023-03-10 ENCOUNTER — Other Ambulatory Visit (HOSPITAL_COMMUNITY): Payer: Self-pay

## 2023-03-24 ENCOUNTER — Other Ambulatory Visit (HOSPITAL_COMMUNITY): Payer: Self-pay

## 2023-03-24 ENCOUNTER — Other Ambulatory Visit: Payer: Self-pay | Admitting: Student

## 2023-03-24 ENCOUNTER — Other Ambulatory Visit: Payer: Self-pay

## 2023-03-24 MED ORDER — ATORVASTATIN CALCIUM 40 MG PO TABS
40.0000 mg | ORAL_TABLET | Freq: Every day | ORAL | 2 refills | Status: DC
  Filled 2023-03-24: qty 30, 30d supply, fill #0
  Filled 2023-04-28: qty 30, 30d supply, fill #1
  Filled 2023-06-08: qty 30, 30d supply, fill #2

## 2023-03-24 MED ORDER — AMLODIPINE BESYLATE 5 MG PO TABS
5.0000 mg | ORAL_TABLET | Freq: Every day | ORAL | 2 refills | Status: DC
  Filled 2023-03-24: qty 30, 30d supply, fill #0
  Filled 2023-04-28: qty 30, 30d supply, fill #1
  Filled 2023-07-06: qty 30, 30d supply, fill #2

## 2023-04-01 ENCOUNTER — Other Ambulatory Visit (HOSPITAL_COMMUNITY): Payer: Self-pay

## 2023-04-14 ENCOUNTER — Other Ambulatory Visit: Payer: Self-pay

## 2023-04-14 ENCOUNTER — Other Ambulatory Visit: Payer: Self-pay | Admitting: Student

## 2023-04-15 ENCOUNTER — Other Ambulatory Visit (HOSPITAL_COMMUNITY): Payer: Self-pay

## 2023-04-15 ENCOUNTER — Other Ambulatory Visit: Payer: Self-pay

## 2023-04-15 MED ORDER — ALBUTEROL SULFATE HFA 108 (90 BASE) MCG/ACT IN AERS
2.0000 | INHALATION_SPRAY | RESPIRATORY_TRACT | 2 refills | Status: DC | PRN
  Filled 2023-04-15 (×2): qty 6.7, 25d supply, fill #0
  Filled 2023-04-22: qty 6.7, 17d supply, fill #0
  Filled 2023-05-03: qty 6.7, 13d supply, fill #1
  Filled 2023-05-20: qty 6.7, 17d supply, fill #2
  Filled 2023-06-09: qty 6.7, 17d supply, fill #3

## 2023-04-15 MED ORDER — ALBUTEROL SULFATE (2.5 MG/3ML) 0.083% IN NEBU
2.5000 mg | INHALATION_SOLUTION | Freq: Four times a day (QID) | RESPIRATORY_TRACT | 2 refills | Status: DC | PRN
  Filled 2023-04-15: qty 300, 25d supply, fill #0
  Filled 2023-06-08 (×2): qty 300, 25d supply, fill #1

## 2023-04-15 NOTE — Telephone Encounter (Signed)
Patient needs refills, he will be going out of town.

## 2023-04-22 ENCOUNTER — Other Ambulatory Visit (HOSPITAL_COMMUNITY): Payer: Self-pay

## 2023-04-22 ENCOUNTER — Encounter: Payer: No Typology Code available for payment source | Admitting: Internal Medicine

## 2023-04-28 ENCOUNTER — Other Ambulatory Visit (HOSPITAL_COMMUNITY): Payer: Self-pay

## 2023-04-29 ENCOUNTER — Encounter: Payer: No Typology Code available for payment source | Admitting: Internal Medicine

## 2023-04-29 ENCOUNTER — Ambulatory Visit: Payer: No Typology Code available for payment source | Admitting: Student

## 2023-04-29 ENCOUNTER — Other Ambulatory Visit (HOSPITAL_COMMUNITY): Payer: Self-pay

## 2023-04-29 ENCOUNTER — Encounter: Payer: Self-pay | Admitting: Student

## 2023-04-29 VITALS — BP 109/54 | HR 90 | Temp 97.5°F | Ht 72.0 in | Wt 169.4 lb

## 2023-04-29 DIAGNOSIS — J449 Chronic obstructive pulmonary disease, unspecified: Secondary | ICD-10-CM

## 2023-04-29 DIAGNOSIS — Z720 Tobacco use: Secondary | ICD-10-CM

## 2023-04-29 DIAGNOSIS — I10 Essential (primary) hypertension: Secondary | ICD-10-CM

## 2023-04-29 DIAGNOSIS — E785 Hyperlipidemia, unspecified: Secondary | ICD-10-CM | POA: Insufficient documentation

## 2023-04-29 DIAGNOSIS — F411 Generalized anxiety disorder: Secondary | ICD-10-CM | POA: Diagnosis not present

## 2023-04-29 DIAGNOSIS — F17210 Nicotine dependence, cigarettes, uncomplicated: Secondary | ICD-10-CM | POA: Diagnosis not present

## 2023-04-29 MED ORDER — BUDESONIDE-FORMOTEROL FUMARATE 160-4.5 MCG/ACT IN AERO
2.0000 | INHALATION_SPRAY | Freq: Two times a day (BID) | RESPIRATORY_TRACT | 12 refills | Status: DC
  Filled 2023-04-29: qty 10.2, 30d supply, fill #0
  Filled 2023-06-08: qty 10.2, 30d supply, fill #1
  Filled 2023-07-01: qty 10.2, 30d supply, fill #2

## 2023-04-29 MED ORDER — METOPROLOL TARTRATE 25 MG PO TABS: 25.0000 mg | ORAL_TABLET | Freq: Two times a day (BID) | ORAL | Status: DC

## 2023-04-29 NOTE — Assessment & Plan Note (Signed)
>>  ASSESSMENT AND PLAN FOR COPD (CHRONIC OBSTRUCTIVE PULMONARY DISEASE) (HCC) WRITTEN ON 04/29/2023  9:23 AM BY [name], [name], DO  This is a pleasant 63 year old male who presented today for routine visit.  Patient reports that he is planning to move to [facility]  on January 15, Wednesday.  He is here at his last visit today.  Of note patient was recently hospitalized on 02/01/2023 for acute hypoxic respiratory failure secondary to COPD exacerbation.  Patient is pretty compliant on his inhalers, he is taking Dulera , Pulmicort , rescue inhaler.  He is stating that his Dulera  used to be free, since his change in insurance, and now Dulera  cost him $304.  I did suggest him another form of inhaler with same ingredients, suggested to Symbicort  is also ICS and LABA.  He was interested in trying the Symbicort .  But otherwise patient reports that overall he has chronic exertional shortness of breath that is not worsening.  He is not currently on oxygen  at home.  He denies any symptoms of cough, congestion, sore throat, fever, or chills.  States that his inhalers are helping his breathing, he has minimal wheezing sometimes but overall not worsening.  Per the lung exam, his lungs are clear to auscultation bilaterally no wheezing or crackles noted.  Overall patient appears stable.  Plan: - Patient is advised to continue taking his inhalers, I will try to send a prescription for Symbicort  to see how much it is covered by his insurance.  Otherwise he is advised to set up a primary care doctor in [place] .

## 2023-04-29 NOTE — Progress Notes (Signed)
 Established Patient Office Visit  Subjective   Patient ID: Francisco [name], male    DOB: Feb 04, 1961  Age: 63 y.o. MRN: [id number]  Chief Complaint  Patient presents with   Follow-up    HPI  This is a 63 year old male living with a history stated below and presents today for 3 month f/u on COPD and transferring care to [place]. Please see problem based assessment and plan for additional details.   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 s/p prior PCI.   Hepatitis-C    Hypertension    IV drug abuse (HCC)    a. 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   Medically noncompliant    MI (myocardial infarction) (HCC)    Tobacco abuse    Past Surgical History:  Procedure Laterality Date   BACK SURGERY     NECK SURGERY     RIGHT/LEFT HEART CATH AND CORONARY ANGIOGRAPHY N/A 04/30/2019   Procedure: RIGHT/LEFT HEART CATH AND CORONARY ANGIOGRAPHY;  Surgeon: Anner Alm ORN, MD;  Location: [facility] INVASIVE  CV LAB;  Service: Cardiovascular;  Laterality: N/A;   Social History   Tobacco Use   Smoking status: Every Day    Current packs/day: 0.50    Types: Cigarettes    Passive exposure: Current   Smokeless tobacco: Never  Vaping Use   Vaping status: Never Used  Substance Use Topics   Alcohol  use: Never   Drug use: Yes    Types: Methamphetamines    Comment: Last use 10/14 per pt   Social History   Socioeconomic History   Marital status: Divorced    Spouse name: Not on file   Number of children: Not on file   Years of education: Not on file   Highest education level: Not on file  Occupational History   Not on file  Tobacco Use   Smoking status: Every Day    Current packs/day: 0.50    Types: Cigarettes    Passive exposure: Current   Smokeless tobacco: Never  Vaping Use   Vaping status: Never Used  Substance and Sexual Activity   Alcohol  use: Never   Drug use: Yes    Types: Methamphetamines    Comment: Last use 10/14 per pt   Sexual activity: Not on file  Other Topics Concern   Not on file  Social History Narrative   Not on file   Social Drivers of Health   Financial Resource Strain: Not on file  Food Insecurity: Food Insecurity Present (02/14/2023)  Hunger Vital Sign    Worried About Running Out of Food in the Last Year: Sometimes true    Ran Out of Food in the Last Year: Sometimes true  Transportation Needs: No Transportation Needs (02/14/2023)   PRAPARE - Administrator, Civil Service (Medical): No    Lack of Transportation (Non-Medical): No  Physical Activity: Not on file  Stress: Not on file  Social Connections: Not on file  Intimate Partner Violence: Not At Risk (02/14/2023)   Humiliation, Afraid, Rape, and Kick questionnaire    Fear of Current or Ex-Partner: No    Emotionally Abused: No    Physically Abused: No    Sexually Abused: No   Family Status  Relation Name Status   Mother  Deceased   Father  Alive   PGF  (Not Specified)  No  partnership data on file   Family History  Problem Relation Age of Onset   Rheum arthritis Mother    Heart disease Paternal Grandfather    Allergies  Allergen Reactions   Ivp Dye [Iodinated Contrast Media] Other (See Comments)    Seizures    Tramadol Other (See Comments)    Seizures    Doxycycline  Other (See Comments)    Burning sensation to his hands.     ROS   As per assessment and plan Objective:     BP (!) 109/54 (BP Location: Left Arm, Patient Position: Sitting, Cuff Size: Normal)   Pulse 90   Temp (!) 97.5 F (36.4 C) (Oral)   Ht 6' (1.829 m)   Wt 169 lb 6.4 oz (76.8 kg)   SpO2 95%   BMI 22.97 kg/m  BP Readings from Last 3 Encounters:  04/29/23 (!) 109/54  02/17/23 (!) 148/97  02/14/23 113/79   Wt Readings from Last 3 Encounters:  04/29/23 169 lb 6.4 oz (76.8 kg)  02/17/23 173 lb 3.2 oz (78.6 kg)  02/13/23 171 lb 15.3 oz (78 kg)   SpO2 Readings from Last 3 Encounters:  04/29/23 95%  02/17/23 99%  02/14/23 97%      Physical Exam  General: Sitting in chair, no acute distress Cardiovascular: Regular rate, regular rhythm, no murmurs/rubs/gallops.  No lower extremity edema bilaterally. Pulmonary: CTA bilaterally, no wheezing or crackles. Abdomen: Soft, nontender, nondistended, bowel sounds present. MSK: Range of motion intact. Neuro: No focal deficits  No results found for any visits on 04/29/23.  Last CBC Lab Results  Component Value Date   WBC 9.3 02/14/2023   HGB 13.5 02/14/2023   HCT 41.6 02/14/2023   MCV 95.4 02/14/2023   MCH 31.0 02/14/2023   RDW 13.1 02/14/2023   PLT 239 02/14/2023   Last metabolic panel Lab Results  Component Value Date   GLUCOSE 192 (H) 02/14/2023   NA 140 02/14/2023   K 4.0 02/14/2023   CL 100 02/14/2023   CO2 27 02/14/2023   BUN 23 02/14/2023   CREATININE 1.35 (H) 02/14/2023   GFRNONAA 59 (L) 02/14/2023   CALCIUM  8.8 (L) 02/14/2023   PHOS 3.7 02/07/2023   PROT 5.5 (L) 02/14/2023   ALBUMIN  3.1 (L)  02/14/2023   BILITOT 0.9 02/14/2023   ALKPHOS 50 02/14/2023   AST 52 (H) 02/14/2023   ALT 146 (H) 02/14/2023   ANIONGAP 13 02/14/2023   Last lipids Lab Results  Component Value Date   CHOL 134 02/13/2023   HDL 49 02/13/2023   LDLCALC 71 02/13/2023   TRIG 70 02/13/2023   CHOLHDL 2.7 02/13/2023   Last  hemoglobin A1c Lab Results  Component Value Date   HGBA1C 6.2 (H) 02/13/2023   Last thyroid  functions Lab Results  Component Value Date   TSH 1.013 08/13/2021   Last vitamin D No results found for: 25OHVITD2, 25OHVITD3, VD25OH Last vitamin B12 and Folate No results found for: VITAMINB12, FOLATE    The ASCVD Risk score (Arnett DK, et al., 2019) failed to calculate for the following reasons:   Risk score cannot be calculated because patient has a medical history suggesting prior/existing ASCVD    Assessment & Plan:   Problem List Items Addressed This Visit       Cardiovascular and Mediastinum   Hypertension - Primary (Chronic)   BP Readings from Last 3 Encounters:  04/29/23 (!) 109/54  02/17/23 (!) 148/97  02/14/23 113/79   [name] blood pressure is low during this office visit, blood pressure recorded 109/54, patient reports that he took 2 of his blood pressure medicines, he is going to pharmacy today to pick up his amlodipine .  States that he occasionally has mild dizziness with changes in position however it does not last the whole day.  Denies any blurry vision, chest pain, lower extremity swelling.  Otherwise he is pretty compliant on his medications.  States that he takes half a tablet of the lisinopril  40 mg, amlodipine  5 mg, metoprolol  25 mg twice daily.  Patient was advised to hold the amlodipine  5 mg until he is seen by his new provider in [place]  or in the event that his blood pressure does go high he can restart his amlodipine  back up again.  I did tell him that we need to recheck his creatinine during this office visit, as it was  elevated per the last office visit, he stated that he does not have time today.  He will get a check in [place]  with his new provider.  Plan: -Hold amlodipine  5 mg in the event of softer blood pressures -Continue Lopressor  25 mg twice daily and 20 mg of lisinopril       Relevant Medications   metoprolol  tartrate (LOPRESSOR ) 25 MG tablet   Other Relevant Orders   BMP8+Anion Gap     Respiratory   COPD (chronic obstructive pulmonary disease) (HCC)   This is a pleasant 63 year old male who presented today for routine visit.  Patient reports that he is planning to move to [facility]  on January 15, Wednesday.  He is here at his last visit today.  Of note patient was recently hospitalized on 02/01/2023 for acute hypoxic respiratory failure secondary to COPD exacerbation.  Patient is pretty compliant on his inhalers, he is taking Dulera , Pulmicort , rescue inhaler.  He is stating that his Dulera  used to be free, since his change in insurance, and now Dulera  cost him $304.  I did suggest him another form of inhaler with same ingredients, suggested to Symbicort  is also ICS and LABA.  He was interested in trying the Symbicort .  But otherwise patient reports that overall he has chronic exertional shortness of breath that is not worsening.  He is not currently on oxygen  at home.  He denies any symptoms of cough, congestion, sore throat, fever, or chills.  States that his inhalers are helping his breathing, he has minimal wheezing sometimes but overall not worsening.  Per the lung exam, his lungs are clear to auscultation bilaterally no wheezing or crackles noted.  Overall patient appears stable.  Plan: - Patient is advised to continue taking his inhalers, I will try  to send a prescription for Symbicort  to see how much it is covered by his insurance.  Otherwise he is advised to set up a primary care doctor in [place] .      Relevant Medications   budesonide -formoterol   (SYMBICORT ) 160-4.5 MCG/ACT inhaler     Other   Tobacco abuse   Patient reports that previously he was smoking 2 packs a day and now he is smoking half a pack a day.  States that he has cut down smoking by himself without any prescriptions or help.  States that previously he had taken Chantix however it affected his mood.  I did tell him that he can try the over-the-counter nicotine  patches that can help him with this half a pack as he is very committed to quitting smoking.  Plan: Continue smoking sensation Try over-the-counter nicotine  patches      Anxiety state   Patient reports that he did try the hydroxyzine  10 mg 3 times daily for 3 weeks however it did not help him.  Patient opens up, states that the primary cause of his anxiety was his girlfriend.  States that he left his previous girlfriend now he does not have any episodes of anxiety.  He has found a new girlfriend and he is very happy.  States that he is moving to   with her.  States that he does not need hydroxyzine .  -Discontinue hydroxyzine       RESOLVED: Hyperlipidemia   Relevant Medications   metoprolol  tartrate (LOPRESSOR ) 25 MG tablet    No follow-ups on file.    Toma Edwards, DO

## 2023-04-29 NOTE — Assessment & Plan Note (Signed)
>>  ASSESSMENT AND PLAN FOR TOBACCO ABUSE WRITTEN ON 04/29/2023  9:24 AM BY [name], [name], DO  Patient reports that previously he was smoking 2 packs a day and now he is smoking half a pack a day.  States that he has cut down smoking by himself without any prescriptions or help.  States that previously he had taken Chantix however it affected his mood.  I did tell him that he can try the over-the-counter nicotine  patches that can help him with this half a pack as he is very committed to quitting smoking.  Plan: Continue smoking sensation Try over-the-counter nicotine  patches

## 2023-04-29 NOTE — Progress Notes (Signed)
 Internal Medicine Clinic Attending  Case discussed with the resident at the time of the visit.  We reviewed the resident's history and exam and pertinent patient test results.  I agree with the assessment, diagnosis, and plan of care documented in the resident's note.

## 2023-04-29 NOTE — Assessment & Plan Note (Signed)
 Patient reports that he did try the hydroxyzine  10 mg 3 times daily for 3 weeks however it did not help him.  Patient opens up, states that the primary cause of his anxiety was his girlfriend.  States that he left his previous girlfriend now he does not have any episodes of anxiety.  He has found a new girlfriend and he is very happy.  States that he is moving to [place]  with her.  States that he does not need hydroxyzine .  -Discontinue hydroxyzine 

## 2023-04-29 NOTE — Assessment & Plan Note (Signed)
 This is a pleasant 63 year old male who presented today for routine visit.  Patient reports that he is planning to move to [facility] [place]  on January 15, Wednesday.  He is here at his last visit today.  Of note patient was recently hospitalized on 02/01/2023 for acute hypoxic respiratory failure secondary to COPD exacerbation.  Patient is pretty compliant on his inhalers, he is taking Dulera , Pulmicort , rescue inhaler.  He is stating that his Dulera  used to be free, since his change in insurance, and now Dulera  cost him $304.  I did suggest him another form of inhaler with same ingredients, suggested to Symbicort  is also ICS and LABA.  He was interested in trying the Symbicort .  But otherwise patient reports that overall he has chronic exertional shortness of breath that is not worsening.  He is not currently on oxygen  at home.  He denies any symptoms of cough, congestion, sore throat, fever, or chills.  States that his inhalers are helping his breathing, he has minimal wheezing sometimes but overall not worsening.  Per the lung exam, his lungs are clear to auscultation bilaterally no wheezing or crackles noted.  Overall patient appears stable.  Plan: - Patient is advised to continue taking his inhalers, I will try to send a prescription for Symbicort  to see how much it is covered by his insurance.  Otherwise he is advised to set up a primary care doctor in [place] .

## 2023-04-29 NOTE — Patient Instructions (Signed)
 Thank you, [name] for allowing us  to provide your care today. Today we discussed:  For your COPD -Continue using your inhalers as prescribed -I did send a prescription for Symbicort  to the pharmacy instead of Dulera  as it was expensive  For your blood pressure Hold amlodipine  until you see the next doctor in [place]  If your blood pressure is high, if it is greater than 130/80, start back amlodipine  Otherwise continue taking metoprolol  and lisinopril  as prescribed.  For your cholesterol Continue taking atorvastatin  1 time daily  Please make sure you establish care at [facility]   If you need any refills please let us  know thank you  I have ordered the following labs for you:  Lab Orders         BMP8+Anion Gap       Tests ordered today:  BMP  Referrals ordered today:   Referral Orders  No referral(s) requested today     I have ordered the following medication/changed the following medications:   Stop the following medications: Medications Discontinued During This Encounter  Medication Reason   mometasone -formoterol  (DULERA ) 200-5 MCG/ACT AERO    metoprolol  tartrate (LOPRESSOR ) 25 MG tablet Reorder     Start the following medications: Meds ordered this encounter  Medications   budesonide -formoterol  (SYMBICORT ) 160-4.5 MCG/ACT inhaler    Sig: Inhale 2 puffs into the lungs in the morning and at bedtime.    Dispense:  1 each    Refill:  12   metoprolol  tartrate (LOPRESSOR ) 25 MG tablet    Sig: Take 1 tablet (25 mg total) by mouth 2 (two) times daily.     Follow up:  None, please establish care in [facility]     Remember:   Should you have any questions or concerns please call the internal medicine clinic at [phone].     Toma Edwards, DO [facility]

## 2023-04-29 NOTE — Assessment & Plan Note (Signed)
 BP Readings from Last 3 Encounters:  04/29/23 (!) 109/54  02/17/23 (!) 148/97  02/14/23 113/79   [name] blood pressure is low during this office visit, blood pressure recorded 109/54, patient reports that he took 2 of his blood pressure medicines, he is going to pharmacy today to pick up his amlodipine .  States that he occasionally has mild dizziness with changes in position however it does not last the whole day.  Denies any blurry vision, chest pain, lower extremity swelling.  Otherwise he is pretty compliant on his medications.  States that he takes half a tablet of the lisinopril  40 mg, amlodipine  5 mg, metoprolol  25 mg twice daily.  Patient was advised to hold the amlodipine  5 mg until he is seen by his new provider in [place]  or in the event that his blood pressure does go high he can restart his amlodipine  back up again.  I did tell him that we need to recheck his creatinine during this office visit, as it was elevated per the last office visit, he stated that he does not have time today.  He will get a check in   with his new provider.  Plan: -Hold amlodipine  5 mg in the event of softer blood pressures -Continue Lopressor  25 mg twice daily and 20 mg of lisinopril 

## 2023-04-29 NOTE — Assessment & Plan Note (Signed)
 Patient reports that previously he was smoking 2 packs a day and now he is smoking half a pack a day.  States that he has cut down smoking by himself without any prescriptions or help.  States that previously he had taken Chantix however it affected his mood.  I did tell him that he can try the over-the-counter nicotine  patches that can help him with this half a pack as he is very committed to quitting smoking.  Plan: Continue smoking sensation Try over-the-counter nicotine  patches

## 2023-05-03 ENCOUNTER — Other Ambulatory Visit (HOSPITAL_COMMUNITY): Payer: Self-pay

## 2023-05-20 ENCOUNTER — Other Ambulatory Visit (HOSPITAL_COMMUNITY): Payer: Self-pay

## 2023-05-25 ENCOUNTER — Inpatient Hospital Stay: Payer: No Typology Code available for payment source | Admitting: Neurology

## 2023-05-26 ENCOUNTER — Other Ambulatory Visit: Payer: Self-pay | Admitting: Internal Medicine

## 2023-05-26 ENCOUNTER — Other Ambulatory Visit (HOSPITAL_COMMUNITY): Payer: Self-pay

## 2023-05-26 ENCOUNTER — Encounter (HOSPITAL_COMMUNITY): Payer: Self-pay

## 2023-05-27 ENCOUNTER — Other Ambulatory Visit: Payer: Self-pay

## 2023-05-27 ENCOUNTER — Emergency Department (HOSPITAL_COMMUNITY)
Admission: EM | Admit: 2023-05-27 | Discharge: 2023-05-27 | Disposition: A | Discharge status: Home / Self Care | Payer: No Typology Code available for payment source | Attending: Emergency Medicine | Admitting: Emergency Medicine

## 2023-05-27 ENCOUNTER — Emergency Department (HOSPITAL_COMMUNITY): Payer: No Typology Code available for payment source

## 2023-05-27 ENCOUNTER — Encounter (HOSPITAL_COMMUNITY): Payer: Self-pay

## 2023-05-27 DIAGNOSIS — I2510 Atherosclerotic heart disease of native coronary artery without angina pectoris: Secondary | ICD-10-CM | POA: Insufficient documentation

## 2023-05-27 DIAGNOSIS — J441 Chronic obstructive pulmonary disease with (acute) exacerbation: Secondary | ICD-10-CM | POA: Insufficient documentation

## 2023-05-27 DIAGNOSIS — I509 Heart failure, unspecified: Secondary | ICD-10-CM | POA: Diagnosis not present

## 2023-05-27 DIAGNOSIS — R0600 Dyspnea, unspecified: Secondary | ICD-10-CM | POA: Diagnosis present

## 2023-05-27 DIAGNOSIS — I110 Hypertensive heart disease with heart failure: Secondary | ICD-10-CM | POA: Insufficient documentation

## 2023-05-27 DIAGNOSIS — Z8673 Personal history of transient ischemic attack (TIA), and cerebral infarction without residual deficits: Secondary | ICD-10-CM | POA: Insufficient documentation

## 2023-05-27 DIAGNOSIS — Z20822 Contact with and (suspected) exposure to covid-19: Secondary | ICD-10-CM | POA: Insufficient documentation

## 2023-05-27 LAB — CBC WITH DIFFERENTIAL/PLATELET
Abs Immature Granulocytes: 0.01 10*3/uL (ref 0.00–0.07)
Basophils Absolute: 0.1 10*3/uL (ref 0.0–0.1)
Basophils Relative: 1 %
Eosinophils Absolute: 0.1 10*3/uL (ref 0.0–0.5)
Eosinophils Relative: 2 %
HCT: 42.5 % (ref 39.0–52.0)
Hemoglobin: 14.2 g/dL (ref 13.0–17.0)
Immature Granulocytes: 0 %
Lymphocytes Relative: 30 %
Lymphs Abs: 2.4 10*3/uL (ref 0.7–4.0)
MCH: 31.1 pg (ref 26.0–34.0)
MCHC: 33.4 g/dL (ref 30.0–36.0)
MCV: 93 fL (ref 80.0–100.0)
Monocytes Absolute: 0.6 10*3/uL (ref 0.1–1.0)
Monocytes Relative: 8 %
Neutro Abs: 4.9 10*3/uL (ref 1.7–7.7)
Neutrophils Relative %: 59 %
Platelets: 293 10*3/uL (ref 150–400)
RBC: 4.57 MIL/uL (ref 4.22–5.81)
RDW: 13.5 % (ref 11.5–15.5)
WBC: 8.1 10*3/uL (ref 4.0–10.5)
nRBC: 0 % (ref 0.0–0.2)

## 2023-05-27 LAB — I-STAT CG4 LACTIC ACID, ED: Lactic Acid, Venous: 1 mmol/L (ref 0.5–1.9)

## 2023-05-27 LAB — I-STAT VENOUS BLOOD GAS, ED
Acid-Base Excess: 7 mmol/L — ABNORMAL HIGH (ref 0.0–2.0)
Bicarbonate: 34 mmol/L — ABNORMAL HIGH (ref 20.0–28.0)
Calcium, Ion: 1.25 mmol/L (ref 1.15–1.40)
HCT: 42 % (ref 39.0–52.0)
Hemoglobin: 14.3 g/dL (ref 13.0–17.0)
O2 Saturation: 91 %
Potassium: 3 mmol/L — ABNORMAL LOW (ref 3.5–5.1)
Sodium: 143 mmol/L (ref 135–145)
TCO2: 36 mmol/L — ABNORMAL HIGH (ref 22–32)
pCO2, Ven: 58.4 mm[Hg] (ref 44–60)
pH, Ven: 7.374 (ref 7.25–7.43)
pO2, Ven: 65 mm[Hg] — ABNORMAL HIGH (ref 32–45)

## 2023-05-27 LAB — COMPREHENSIVE METABOLIC PANEL
ALT: 77 U/L — ABNORMAL HIGH (ref 0–44)
AST: 35 U/L (ref 15–41)
Albumin: 3.7 g/dL (ref 3.5–5.0)
Alkaline Phosphatase: 73 U/L (ref 38–126)
Anion gap: 11 (ref 5–15)
BUN: 7 mg/dL — ABNORMAL LOW (ref 8–23)
CO2: 30 mmol/L (ref 22–32)
Calcium: 9.2 mg/dL (ref 8.9–10.3)
Chloride: 102 mmol/L (ref 98–111)
Creatinine, Ser: 0.91 mg/dL (ref 0.61–1.24)
GFR, Estimated: >60 mL/min (ref >60)
Glucose, Bld: 127 mg/dL — ABNORMAL HIGH (ref 70–99)
Potassium: 3.1 mmol/L — ABNORMAL LOW (ref 3.5–5.1)
Sodium: 143 mmol/L (ref 135–145)
Total Bilirubin: 0.6 mg/dL (ref 0.0–1.2)
Total Protein: 6.7 g/dL (ref 6.5–8.1)

## 2023-05-27 LAB — RESP PANEL BY RT-PCR (RSV, FLU A&B, COVID)  RVPGX2
Influenza A by PCR: NEGATIVE
Influenza B by PCR: NEGATIVE
Resp Syncytial Virus by PCR: NEGATIVE
SARS Coronavirus 2 by RT PCR: NEGATIVE

## 2023-05-27 LAB — BRAIN NATRIURETIC PEPTIDE: B Natriuretic Peptide: 103.4 pg/mL — ABNORMAL HIGH (ref 0.0–100.0)

## 2023-05-27 LAB — TROPONIN I (HIGH SENSITIVITY): Troponin I (High Sensitivity): 14 ng/L (ref <18)

## 2023-05-27 MED ORDER — SODIUM CHLORIDE 0.9 % IV SOLN
2.0000 g | Freq: Once | INTRAVENOUS | Status: AC
  Administered 2023-05-27: 2 g via INTRAVENOUS
  Filled 2023-05-27: qty 20

## 2023-05-27 MED ORDER — LABETALOL HCL 5 MG/ML IV SOLN
10.0000 mg | Freq: Once | INTRAVENOUS | Status: AC
  Administered 2023-05-27: 10 mg via INTRAVENOUS
  Filled 2023-05-27: qty 4

## 2023-05-27 MED ORDER — ALBUTEROL (5 MG/ML) CONTINUOUS INHALATION SOLN
10.0000 mg/h | INHALATION_SOLUTION | Freq: Once | RESPIRATORY_TRACT | Status: AC
  Administered 2023-05-27: 10 mg/h via RESPIRATORY_TRACT
  Filled 2023-05-27: qty 20

## 2023-05-27 MED ORDER — AZITHROMYCIN 250 MG PO TABS
250.0000 mg | ORAL_TABLET | Freq: Every day | ORAL | 0 refills | Status: AC
  Filled 2023-05-27: qty 4, 4d supply, fill #0

## 2023-05-27 MED ORDER — ALBUTEROL SULFATE HFA 108 (90 BASE) MCG/ACT IN AERS
4.0000 | INHALATION_SPRAY | Freq: Once | RESPIRATORY_TRACT | Status: AC
  Administered 2023-05-27: 4 via RESPIRATORY_TRACT
  Filled 2023-05-27: qty 6.7

## 2023-05-27 MED ORDER — SODIUM CHLORIDE 0.9 % IV SOLN
500.0000 mg | Freq: Once | INTRAVENOUS | Status: AC
  Administered 2023-05-27: 500 mg via INTRAVENOUS
  Filled 2023-05-27: qty 5

## 2023-05-27 MED ORDER — PREDNISONE 10 MG PO TABS
40.0000 mg | ORAL_TABLET | Freq: Every day | ORAL | 0 refills | Status: AC
  Filled 2023-05-27: qty 15, 3d supply, fill #0

## 2023-05-27 NOTE — ED Notes (Signed)
 Pt upset that his cardiac alarm is making noise due to his elevated BP. Advised pt this was due to his blood pressure being high. Pt stated "Im not listening to this shit all night, get me out of here",EDP made aware.

## 2023-05-27 NOTE — Discharge Instructions (Addendum)
 You were seen in the emergency department for evaluation of shortness of breath.  Test performed while you are here include and sometimes EKG, chest x-ray, blood work, viral testing.  These tests were overall reassuring against any emergent or life-threatening causes of your symptoms.  We gave you a dose of steroids and breathing treatments with improvement of your symptoms.  Your blood pressure was quite elevated while in the emergency department, so we recommend that you restart your home amlodipine .  Please keep a blood pressure log and follow-up with your PCP early next week for reevaluation and further medication plans.  Your potassium level was also mildly low so we recommend that you start taking potassium supplementation.  Your BNP level was mildly elevated so with your history of heart failure you may want to discuss with your primary care doctor starting a low-dose diuretic.  Please take the prescribed remaining doses of antibiotics and steroids.  You can use albuterol  every 4 hours as needed for ongoing shortness of breath.  You should visually continue using your prescribed COPD medications. If you experience significantly increased shortness of breath, loss of consciousness, increased leg swelling, or any other concerns, return to the ED for reevaluation.

## 2023-05-27 NOTE — ED Provider Notes (Signed)
 [place] [facility] DEPARTMENT AT [facility] Provider Note  MDM   HPI/ROS:  Francisco [name] is a 63 y.o. male with pertinent past medical history of COPD, CHF, CAD s/p PCI, pulmonary hypertension, HTN, stroke who presents for evaluation of dyspnea. Patient reports a 2-day history of worsening dyspnea and chest tightness that feels similar to his prior COPD exacerbations.  He notes he has been having to use his albuterol  inhaler more frequently as of recent, but symptoms have been acutely worse over the past couple of days.  He denies any associated fevers, chills, leg swelling and notes that his cough is actually less productive than it normally is.  He tried 5 nebulizer treatments today without any improvement so he decided to call EMS for further evaluation.  On EMS arrival, they report he was hypertensive to the 200s systolic with SpO2 92% on room air.  They gave him 2 DuoNeb treatments, 10 mg albuterol , 1 of Atrovent , 125 mg Solu-Medrol , and 2 g of magnesium .  In the ED, patient notes very mild improvement of dyspnea after receiving steroids but reports continued symptoms.  Physical exam is notable for: - Appears dyspneic though speaking in complete sentences -- Diffusely diminished breath sounds with diffuse end expiratory wheezing and trace crackles on the left -- Trace pitting edema of the bilateral lower extremities  On my initial evaluation, patient is:  - Patient hypertensive and Neck but overall hemodynamically stable, with SpO2 97% on room air  -Additional history obtained from EMS and review of her records  This patient's current presentation, including their history and physical exam, is most consistent with COPD exacerbation.  Additional differentials include viral infection, pneumonia, heart failure exacerbation, hypertensive emergency.  Overall much lower suspicion for ACS given lack of chest pain and suspect chest tightness more related to dyspnea, however will plan to  obtain troponin x 2 rule out.  Additionally consider PE, however this is overall felt much less likely based on history and exam with presentation much more consistent with above differentials, thus do not feel further testing for PE indicated at this time.  He received steroids and 1 1 DuoNeb treatments with EMS en route.  Given ongoing significant dyspnea with diminished breath sounds discussed potentially trialing BiPAP for brief period of time for PEEP and afterload reduction, however patient declined and thus decision made to start on continuous albuterol .  Antibiotics ordered for further management of likely COPD exacerbation.  Additionally will obtain chest x-ray, labs including troponin x 2 and BNP, viral testing.   Interpretations, interventions, and the patient's course of care are documented below.    -EKG reviewed: sinus tachycardia rate 100, normal axis, normal intervals, mild T wave flattening in the inferior leads.  No STEMI, no acute ischemic changes. -Chest x-ray reviewed: No significant pulm edema, focal opacities -Labs reviewed: ABG with pH 7.37, pCO2 58.4, lactic acid 1.0, WBC 8.1, hemoglobin 14.2, creatinine 0.1, potassium 3.1, initial troponin 14, BNP 103.4 -Viral testing negative -On reevaluation, patient resting comfortably in no acute distress though expressing that he would like to be discharged as he is feeling much better and is  tired of all the beeping monitors.   -On auscultation, patient with significantly improved air movement throughout all lung fields, now with only trace expiratory wheezes.  He does continue to be mildly tachycardic, though suspect this may be at least in part due to effects of albuterol . -reviewed results of his workup at bedside.  BNP is elevated from prior (was  55 three months ago), however patient overall does not appear clinically hypervolemic in addition to chest x-ray without pulmonary edema, thus feel presentation is less consistent with acute  heart failure saturation though he was advised to discuss potential need for restarting Lasix  in the outpatient setting with his PCP. -Chest x-ray further without pneumonia -Additionally discussed that his blood pressure is quite elevated today this time he shares he was recently taken off of his amlodipine  within the past couple of weeks due to reported soft blood pressures in his PCP office, so he will begin taking that again when he returns home.  He was additionally advised to continue keeping his blood pressure log and follow-up with his PCP next week for further guidance. -Initial troponin was negative and decreased from last level from last month.  Discussed recommendation to stay for delta troponin with patient at bedside, however he declined.  Given much lower suspicion for ACS as etiology of patient's symptoms, deferred delta troponin.  Patient was given one-time dose of IV labetalol  for persistent hypertension.  He was additionally advised to restart his home amlodipine  upon returning home as discussed above.  Prescriptions were sent for remainder of steroid course and Antibiotics for likely heart failure exacerbation.  He additionally received albuterol  inhaler prior to discharge.  Patient states he will contact his PCP for [name] next week to arrange close follow-up.  Strict return precautions were provided.  Patient main stable and had no acute untoward my care in the emergency room.  Disposition:  I discussed the plan for discharge with the patient and/or their surrogate at bedside prior to discharge and they were in agreement with the plan and verbalized understanding of the return precautions provided. All questions answered to the best of my ability. Ultimately, the patient was discharged in stable condition with stable vital signs. I am reassured that they are capable of close follow up and good social support at home.   Clinical Impression:  1. COPD exacerbation (HCC)     Rx / DC  Orders ED Discharge Orders          Ordered    predniSONE  (DELTASONE ) 10 MG tablet  Daily        05/27/23 2112    azithromycin  (ZITHROMAX  Z-PAK) 250 MG tablet  Daily        05/27/23 2112            The plan for this patient was discussed with Dr. Zavitz, who voiced agreement and who oversaw evaluation and treatment of this patient.   Clinical Complexity A medically appropriate history, review of systems, and physical exam was performed.  My independent interpretations of EKG, labs, and radiology are documented in the ED course above.   If decision rules were used in this patient's evaluation, they are listed below.   [name] presentation is most consistent with acute presentation with potential threat to life or bodily function.  Medical Decision Making Amount and/or Complexity of Data Reviewed Labs: ordered. Radiology: ordered.  Risk Prescription drug management.    HPI/ROS      See MDM section for pertinent HPI and ROS. A complete ROS was performed with pertinent positives/negatives noted above.   Past Medical History:  Diagnosis Date   CHF (congestive heart failure) (HCC)    COPD (chronic obstructive pulmonary disease) (HCC)    COPD with acute exacerbation (HCC) 10/21/2021   Coronary artery disease    a. s/p prior PCI.   Hepatitis-C    Hypertension  IV drug abuse (HCC)    a. 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   Medically noncompliant    MI (myocardial infarction) (HCC)    Tobacco abuse     Past Surgical History:  Procedure Laterality Date   BACK SURGERY     NECK SURGERY     RIGHT/LEFT HEART CATH AND CORONARY ANGIOGRAPHY N/A 04/30/2019   Procedure: RIGHT/LEFT HEART CATH AND CORONARY ANGIOGRAPHY;  Surgeon: Anner Alm ORN, MD;  Location: [facility] INVASIVE CV LAB;  Service: Cardiovascular;  Laterality: N/A;      Physical Exam   ED Triage Vitals  Encounter Vitals Group     BP 05/27/23 2000 (!) 205/107     Systolic BP Percentile --       Diastolic BP Percentile --      Pulse Rate 05/27/23 2000 (!) 105     Resp 05/27/23 2000 16     Temp --      Temp src --      SpO2 05/27/23 2000 97 %     Weight --      Height --      Head Circumference --      Peak Flow --      Pain Score 05/27/23 1918 0     Pain Loc --      Pain Education --      Exclude from Growth Chart --     Physical Exam Gen: In moderate respiratory distress.  Tachypneic, nebulizer mask in place. HENT: Conjunctiva clear, PERRL, EOMI. MMM.  CV: RRR. No M/R/G Pulm: Previously diminished lung sounds/poor air movement with some end expiratory wheezing GI: Abdomen soft, non-tender, non-distended. Normal bowel sounds in all 4 quadrants. MSK/Skin: Trace lower extremity edema. Extremities warm, well-perfused with 2+ pulses in all 4 extremities. Neuro: A&Ox3. GCS 15. Moves all extremities.     Procedures   If procedures were preformed on this patient, they are listed below:  Procedures   Laverda Rimes, MD Emergency Medicine PGY-2   Please note that this documentation was produced with the assistance of voice-to-text technology and may contain errors.    Rimes Laverda, MD 05/28/23 1301    Tonia Chew, MD 05/29/23 [phone]

## 2023-05-27 NOTE — ED Notes (Signed)
 Pt refused all further interventions.

## 2023-05-27 NOTE — ED Triage Notes (Signed)
 Pt to ED by EMS from home with c/o COPD exacerbation which began yesterday. Pt reports no relief from home meds. EMS administered 2x DuoNeb, 125mg  solumedrol, 2 grams of mag total by EMS. Wheezing noted throughout, MD at bedside.

## 2023-05-28 ENCOUNTER — Other Ambulatory Visit: Payer: Self-pay

## 2023-05-29 ENCOUNTER — Other Ambulatory Visit: Payer: Self-pay

## 2023-06-01 ENCOUNTER — Other Ambulatory Visit (HOSPITAL_COMMUNITY): Payer: Self-pay

## 2023-06-08 ENCOUNTER — Other Ambulatory Visit (HOSPITAL_COMMUNITY): Payer: Self-pay

## 2023-06-08 ENCOUNTER — Other Ambulatory Visit: Payer: Self-pay

## 2023-06-08 MED ORDER — LISINOPRIL 40 MG PO TABS
20.0000 mg | ORAL_TABLET | Freq: Every day | ORAL | 3 refills | Status: DC
  Filled 2023-06-08: qty 45, 90d supply, fill #0

## 2023-06-08 MED ORDER — METOPROLOL TARTRATE 25 MG PO TABS: 25.0000 mg | ORAL_TABLET | Freq: Two times a day (BID) | ORAL | Status: DC

## 2023-06-08 NOTE — Telephone Encounter (Signed)
 Patient called he is requesting a call to discuss which medications he is supposed to be taking. Please return patients call.

## 2023-06-09 ENCOUNTER — Telehealth: Payer: Self-pay | Admitting: *Deleted

## 2023-06-09 ENCOUNTER — Other Ambulatory Visit (HOSPITAL_COMMUNITY): Payer: Self-pay

## 2023-06-09 ENCOUNTER — Other Ambulatory Visit: Payer: Self-pay | Admitting: Internal Medicine

## 2023-06-09 NOTE — Telephone Encounter (Signed)
 Refill request for Albuterol inhaler  from pharmacy. Pt has a different PCP on chart. I called pt who stated he did not move to [place]. Message sent to Practice Administrator - Dr Nooruddin will be pt's PCP.

## 2023-06-10 ENCOUNTER — Other Ambulatory Visit (HOSPITAL_COMMUNITY): Payer: Self-pay

## 2023-06-10 ENCOUNTER — Other Ambulatory Visit: Payer: Self-pay | Admitting: Student

## 2023-06-10 MED ORDER — ALBUTEROL SULFATE HFA 108 (90 BASE) MCG/ACT IN AERS
2.0000 | INHALATION_SPRAY | RESPIRATORY_TRACT | 2 refills | Status: DC | PRN
  Filled 2023-06-10 – 2023-07-01 (×2): qty 6.7, 17d supply, fill #0
  Filled 2023-07-13 (×2): qty 6.7, 17d supply, fill #1
  Filled 2023-07-13: qty 6.7, 12d supply, fill #1
  Filled 2023-07-15 – 2023-08-26 (×3): qty 6.7, 17d supply, fill #1
  Filled 2023-09-13: qty 6.7, 17d supply, fill #2

## 2023-06-16 ENCOUNTER — Other Ambulatory Visit (HOSPITAL_COMMUNITY): Payer: Self-pay

## 2023-06-16 ENCOUNTER — Telehealth: Payer: Self-pay

## 2023-06-16 MED ORDER — ALBUTEROL SULFATE (2.5 MG/3ML) 0.083% IN NEBU
2.5000 mg | INHALATION_SOLUTION | Freq: Four times a day (QID) | RESPIRATORY_TRACT | 2 refills | Status: DC | PRN
  Filled 2023-06-16 – 2023-09-13 (×2): qty 300, 25d supply, fill #0
  Filled 2023-10-11: qty 300, 25d supply, fill #1
  Filled 2024-01-06 – 2024-01-09 (×3): qty 300, 25d supply, fill #2

## 2023-06-16 NOTE — Telephone Encounter (Signed)
 Pt is requesting a call back ... He stated he is having a flare up of his CODP ... To tired to offer him the first opening we have but he stated he will be out of town next week

## 2023-06-16 NOTE — Telephone Encounter (Signed)
 Return pt's call who stated COPD is flaring up. C/o trouble breathing, coughing up phlegm."Feels like someone sitting on my chest". Using his nebulizer. He's requesting an appt for tomorrow. Informed pt no available appts tomorrow and to go to the ER since he's having difficulty breathing. Pt stated if no appts become available tomorrow, he will.

## 2023-06-21 ENCOUNTER — Other Ambulatory Visit: Payer: Self-pay | Admitting: Student

## 2023-06-21 DIAGNOSIS — I2510 Atherosclerotic heart disease of native coronary artery without angina pectoris: Secondary | ICD-10-CM

## 2023-06-22 ENCOUNTER — Other Ambulatory Visit: Payer: Self-pay

## 2023-06-22 ENCOUNTER — Other Ambulatory Visit (HOSPITAL_COMMUNITY): Payer: Self-pay

## 2023-06-22 MED ORDER — LOVASTATIN 20 MG PO TABS
20.0000 mg | ORAL_TABLET | Freq: Every day | ORAL | 3 refills | Status: DC
  Filled 2023-06-22: qty 90, 90d supply, fill #0

## 2023-06-23 ENCOUNTER — Other Ambulatory Visit (HOSPITAL_COMMUNITY): Payer: Self-pay

## 2023-06-24 ENCOUNTER — Encounter (HOSPITAL_COMMUNITY): Payer: Self-pay | Admitting: Emergency Medicine

## 2023-06-24 ENCOUNTER — Emergency Department (HOSPITAL_COMMUNITY)

## 2023-06-24 ENCOUNTER — Other Ambulatory Visit: Payer: Self-pay

## 2023-06-24 ENCOUNTER — Emergency Department (HOSPITAL_COMMUNITY)
Admission: EM | Admit: 2023-06-24 | Discharge: 2023-06-24 | Disposition: A | Discharge status: Home / Self Care | Attending: Emergency Medicine | Admitting: Emergency Medicine

## 2023-06-24 ENCOUNTER — Other Ambulatory Visit (HOSPITAL_COMMUNITY): Payer: Self-pay

## 2023-06-24 DIAGNOSIS — J441 Chronic obstructive pulmonary disease with (acute) exacerbation: Secondary | ICD-10-CM | POA: Diagnosis not present

## 2023-06-24 DIAGNOSIS — Z7982 Long term (current) use of aspirin: Secondary | ICD-10-CM | POA: Insufficient documentation

## 2023-06-24 DIAGNOSIS — Z7951 Long term (current) use of inhaled steroids: Secondary | ICD-10-CM | POA: Diagnosis not present

## 2023-06-24 DIAGNOSIS — R0602 Shortness of breath: Secondary | ICD-10-CM | POA: Diagnosis present

## 2023-06-24 LAB — CBC WITH DIFFERENTIAL/PLATELET
Abs Immature Granulocytes: 0.02 10*3/uL (ref 0.00–0.07)
Basophils Absolute: 0.1 10*3/uL (ref 0.0–0.1)
Basophils Relative: 1 %
Eosinophils Absolute: 0.1 10*3/uL (ref 0.0–0.5)
Eosinophils Relative: 1 %
HCT: 43.7 % (ref 39.0–52.0)
Hemoglobin: 14.8 g/dL (ref 13.0–17.0)
Immature Granulocytes: 0 %
Lymphocytes Relative: 13 %
Lymphs Abs: 0.9 10*3/uL (ref 0.7–4.0)
MCH: 31.3 pg (ref 26.0–34.0)
MCHC: 33.9 g/dL (ref 30.0–36.0)
MCV: 92.4 fL (ref 80.0–100.0)
Monocytes Absolute: 0.4 10*3/uL (ref 0.1–1.0)
Monocytes Relative: 6 %
Neutro Abs: 5.1 10*3/uL (ref 1.7–7.7)
Neutrophils Relative %: 79 %
Platelets: 226 10*3/uL (ref 150–400)
RBC: 4.73 MIL/uL (ref 4.22–5.81)
RDW: 13.4 % (ref 11.5–15.5)
WBC: 6.5 10*3/uL (ref 4.0–10.5)
nRBC: 0 % (ref 0.0–0.2)

## 2023-06-24 LAB — COMPREHENSIVE METABOLIC PANEL
ALT: 59 U/L — ABNORMAL HIGH (ref 0–44)
AST: 28 U/L (ref 15–41)
Albumin: 3.5 g/dL (ref 3.5–5.0)
Alkaline Phosphatase: 63 U/L (ref 38–126)
Anion gap: 8 (ref 5–15)
BUN: 9 mg/dL (ref 8–23)
CO2: 30 mmol/L (ref 22–32)
Calcium: 8.7 mg/dL — ABNORMAL LOW (ref 8.9–10.3)
Chloride: 102 mmol/L (ref 98–111)
Creatinine, Ser: 1.11 mg/dL (ref 0.61–1.24)
GFR, Estimated: >60 mL/min (ref >60)
Glucose, Bld: 122 mg/dL — ABNORMAL HIGH (ref 70–99)
Potassium: 3.8 mmol/L (ref 3.5–5.1)
Sodium: 140 mmol/L (ref 135–145)
Total Bilirubin: 0.6 mg/dL (ref 0.0–1.2)
Total Protein: 6.1 g/dL — ABNORMAL LOW (ref 6.5–8.1)

## 2023-06-24 LAB — LIPASE, BLOOD: Lipase: 47 U/L (ref 11–51)

## 2023-06-24 LAB — BRAIN NATRIURETIC PEPTIDE: B Natriuretic Peptide: 50.4 pg/mL (ref 0.0–100.0)

## 2023-06-24 LAB — D-DIMER, QUANTITATIVE: D-Dimer, Quant: 0.36 ug{FEU}/mL (ref 0.00–0.50)

## 2023-06-24 MED ORDER — METHYLPREDNISOLONE 4 MG PO TBPK
ORAL_TABLET | ORAL | 0 refills | Status: DC
  Filled 2023-06-24: qty 21, 6d supply, fill #0

## 2023-06-24 MED ORDER — IPRATROPIUM-ALBUTEROL 0.5-2.5 (3) MG/3ML IN SOLN
3.0000 mL | Freq: Once | RESPIRATORY_TRACT | Status: AC
  Administered 2023-06-24: 3 mL via RESPIRATORY_TRACT
  Filled 2023-06-24: qty 3

## 2023-06-24 MED ORDER — FENTANYL CITRATE PF 50 MCG/ML IJ SOSY
50.0000 ug | PREFILLED_SYRINGE | Freq: Once | INTRAMUSCULAR | Status: AC
  Administered 2023-06-24: 50 ug via INTRAVENOUS
  Filled 2023-06-24: qty 1

## 2023-06-24 MED ORDER — ONDANSETRON HCL 4 MG/2ML IJ SOLN
4.0000 mg | Freq: Once | INTRAMUSCULAR | Status: AC
Start: 2023-06-24 — End: 2023-06-24
  Administered 2023-06-24: 4 mg via INTRAVENOUS
  Filled 2023-06-24: qty 2

## 2023-06-24 NOTE — ED Notes (Signed)
 Pt just returned to room from x-ray and ultrasound

## 2023-06-24 NOTE — ED Notes (Signed)
 Patient transported to X-ray

## 2023-06-24 NOTE — ED Provider Notes (Signed)
 [place] EMERGENCY DEPARTMENT AT [facility] Provider Note   CSN: [id number] Arrival date & time: 06/24/23  6213     History  No chief complaint on file.   Francisco [name] is a 63 y.o. male who presents emergency department with chief complaint of shortness of breath.  He has a past medical history of pulmonary arterial hypertension and COPD.  Patient states he has difficulty breathing all of the time however things were much worse today.  He thinks he may be having a COPD exacerbation.  He was treated by EMS prior to arrival with a DuoNeb, magnesium, Solu-Medrol with improvement but still has increased work of breathing.  He is also complaining of pain in his right upper quadrant which hurts worse with breathing.  He states it feels like it is catching.  He is never had pain like this before.Denies fevers or chills.  HPI     Home Medications Prior to Admission medications   Medication Sig Start Date End Date Taking? Authorizing Provider  albuterol (PROVENTIL) (2.5 MG/3ML) 0.083% nebulizer solution Use 1 vial (2.5 mg total) by nebulization every 6 (six) hours as needed for wheezing or shortness of breath. 06/16/23   Atway, Rayann N, DO  albuterol (VENTOLIN HFA) 108 (90 Base) MCG/ACT inhaler Inhale 2 puffs into the lungs every 4 (four) hours as needed for wheezing or shortness of breath. 06/10/23   Nooruddin, Jason Fila, MD  amLODipine (NORVASC) 5 MG tablet Take 1 tablet (5 mg total) by mouth daily. 03/24/23   Nooruddin, Jason Fila, MD  aspirin EC 81 MG tablet Take 1 tablet (81 mg total) by mouth daily. Swallow whole. 02/15/23   Peterson Ao, MD  atorvastatin (LIPITOR) 40 MG tablet Take 1 tablet (40 mg total) by mouth daily. 03/24/23   Nooruddin, Jason Fila, MD  Budesonide (PULMICORT FLEXHALER) 90 MCG/ACT inhaler Inhale 1 puff into the lungs 2 (two) times daily. 02/17/23   Nooruddin, Jason Fila, MD  budesonide-formoterol [facility]) 160-4.5 MCG/ACT inhaler Inhale 2 puffs into the lungs in the morning and at  bedtime. 04/29/23   [name], [name], DO  lidocaine (LIDODERM) 5 % Place 2 patches onto the skin daily. Remove & Discard patch within 12 hours or as directed by MD Patient not taking: Reported on 02/14/2023 02/11/23   Peterson Ao, MD  lisinopril (ZESTRIL) 40 MG tablet Take 1/2 tablet (20 mg total) by mouth daily. 06/08/23   Kathleen Lime, MD  lovastatin (MEVACOR) 20 MG tablet Take 1 tablet (20 mg total) by mouth daily. 06/21/23   Nooruddin, Jason Fila, MD  metoprolol tartrate (LOPRESSOR) 25 MG tablet Take 1 tablet (25 mg total) by mouth 2 (two) times daily. 06/08/23 09/06/23  Kathleen Lime, MD  nicotine (NICODERM CQ - DOSED IN MG/24 HOURS) 14 mg/24hr patch Place 1 patch (14 mg total) onto the skin daily. Patient not taking: Reported on 02/14/2023 06/24/21   Adron Bene, MD  nitroGLYCERIN (NITROSTAT) 0.4 MG SL tablet Place 1 tablet (0.4 mg total) under the tongue every 5 (five) minutes x 3 doses as needed for chest pain. 10/14/22   Nooruddin, Jason Fila, MD      Allergies    Ivp dye [iodinated contrast media], Tramadol, and Doxycycline    Review of Systems   Review of Systems  Physical Exam Updated Vital Signs BP (!) 158/91   Pulse (!) 102   Temp (!) 97.4 F (36.3 C)   Resp (!) 23   Ht 6' (1.829 m)   Wt 77.1 kg   SpO2 98%  BMI 23.06 kg/m  Physical Exam Vitals and nursing note reviewed.  Constitutional:      General: He is not in acute distress.    Appearance: He is well-developed. He is not diaphoretic.  HENT:     Head: Normocephalic and atraumatic.  Eyes:     General: No scleral icterus.    Conjunctiva/sclera: Conjunctivae normal.  Cardiovascular:     Rate and Rhythm: Normal rate and regular rhythm.     Heart sounds: Normal heart sounds.  Pulmonary:     Effort: Accessory muscle usage and prolonged expiration present. No respiratory distress.     Breath sounds: Normal breath sounds. No decreased air movement. No decreased breath sounds.     Comments: Speaking in full sentences,  decreased air movement and breath sounds throughout the lung fields. Abdominal:     Palpations: Abdomen is soft.     Tenderness: There is abdominal tenderness in the right upper quadrant.  Musculoskeletal:     Cervical back: Normal range of motion and neck supple.  Skin:    General: Skin is warm and dry.  Neurological:     Mental Status: He is alert.  Psychiatric:        Behavior: Behavior normal.     ED Results / Procedures / Treatments   Labs (all labs ordered are listed, but only abnormal results are displayed) Labs Reviewed  CBC WITH DIFFERENTIAL/PLATELET  BRAIN NATRIURETIC PEPTIDE  COMPREHENSIVE METABOLIC PANEL  LIPASE, BLOOD    EKG None  Radiology No results found.  Procedures Procedures    Medications Ordered in ED Medications  fentaNYL (SUBLIMAZE) injection 50 mcg (has no administration in time range)  ondansetron (ZOFRAN) injection 4 mg (has no administration in time range)    ED Course/ Medical Decision Making/ A&P Clinical Course as of 06/24/23 1032  Fri Jun 24, 2023  0839 ED EKG EKG shows sinus tachycardia at a rate of 101 [AH]  0839 DG Chest 2 View I visualized interpreted two-view chest x-ray which shows no evidence of pulmonary edema or consolidation on my interpretation [AH]  0944 Patient reevaluated.  He is feeling much better at this time, does not have severe work of breathing.  No wheezing.  Looks much better.  Patient states "I feel like a new person." [AH]    Clinical Course User Index [AH] Arthor Captain, PA-C                                 Medical Decision Making Amount and/or Complexity of Data Reviewed Labs: ordered. Radiology: ordered. Decision-making details documented in ED Course. ECG/medicine tests:  Decision-making details documented in ED Course.  Risk Prescription drug management.   This patient presents to the ED with chief complaint(s) of shortness of breath with pertinent past medical history of pulmonary arterial  hypertension and COPD which further complicates the presenting complaint. The complaint involves an extensive differential diagnosis and treatment options and also carries with it a high risk of complications and morbidity.    The differential diagnosis includes COPD exacerbation, pulmonary hypertension exacerbation, acute cholecystitis, pulmonary embolus with infarction.  The initial plan is to order labs, imaging, cardiac monitoring and to treat patient with DuoNeb and pain medication for shortness of breath and right upper quadrant abdominal pain    Additional history obtained: Additional history obtained from  nursing triage Records reviewed previous admission documents  Reassessment and review (also see workup area): Lab Tests:  I Ordered, and personally interpreted labs.  The pertinent results include:   Negative D-dimer, CBC within normal limits, slightly elevated blood glucose after steroids, the remainder of labs are insignificant  Imaging Studies: I ordered and independently visualized and interpreted the following imaging X-ray of the chest and right upper quadrant abdominal ultrasound   which showed x-ray shows hyperinflation, otherwise negative imaging The interpretation of the imaging was limited to assessing for emergent pathology, for which purpose it was ordered.    Medicines ordered and prescription drug management: I ordered the following medications DuoNeb for shortness of breath, pain medicine for right upper quadrant I considered this additional medications: Steroids at discharge   Reevaluation of the patient after these medicines showed that the patient    improved    Cardiac Monitoring: The patient was maintained on a cardiac monitor.  I personally viewed and interpreted the cardiac monitor which showed an underlying rhythm of:  sinus tachycardia now normal sinus rhythm  Complexity of problems addressed: [name] presentation is most consistent with  severe  exacerbation of chronic illness During patient's assessment  Disposition: After consideration of the diagnostic results and the patient's response to treatment,  I feel that the patent would benefit from discharge return precautions discussed .          Final Clinical Impression(s) / ED Diagnoses Final diagnoses:  COPD exacerbation [facility])    Rx / DC Orders ED Discharge Orders     None         Arthor Captain, PA-C 06/24/23 1036    Margarita Grizzle, MD 06/24/23 1553

## 2023-06-24 NOTE — ED Provider Notes (Signed)
 63 year old man history of COPD presents today with cough and dyspnea.  Patient seen and evaluated and given steroids and treatment Symptoms are now improved. Lungs have some scattered expiratory wheezes but improved air movement Plan steroids.  Discussed return precautions need for follow-up patient voiced understanding   Margarita Grizzle, MD 06/24/23 [phone]

## 2023-06-24 NOTE — ED Triage Notes (Signed)
 PT BIB [facility] from home for respiratory distress. SOB since yesterday.  Home nebs and rescue inhaler stopped helping  Pt has hx of COPD.Marland Kitchen   88% HR, 114 on scene.   EMS gave duoneb 2g Mag, 125 solumedrol   164/100 100% on neb, HR 100 NS.

## 2023-06-24 NOTE — Discharge Instructions (Signed)
 Contact a health care provider if:  Your COPD symptoms get worse.  You have a fever or chills.  You have trouble doing daily activities.  You have trouble breathing even when you are resting.    Get help right away if:  You are short of breath and cannot:  Talk in full sentences.  Do normal activities.    You have chest pain.  You feel confused.  These symptoms may be an emergency. Call 911 right away.  Do not wait to see if the symptoms will go away.  Do not drive yourself to the hospital.

## 2023-06-27 ENCOUNTER — Ambulatory Visit: Payer: Self-pay | Admitting: Student

## 2023-06-27 NOTE — Telephone Encounter (Signed)
 Copied from CRM [id number]. Topic: Clinical - Medical Advice >> Jun 27, 2023 12:43 PM Maree Krabbe H wrote: Reason for CRM: Patient said his roommate had to go to the ER on Friday around noon and he went to the ER Friday morning and he went for his COPD and he couldn't breath with is nothing unusual and he wants to know what he needs to do so he wont catch what his roommate has, his roommate caught MRSA and is pretty much a vegetable and will now need 24 hour care and he is still in ICU and is still in a life threating state. PATIENT STATES HE WAS GIVEN A SHOT OF MAGNESIUM IN THE ER AND WANTS TO KNOW IF HE CAN HAVE A RX FOR THIS MEDICATION.  STATES HE FELT GREAT AFTERWARDS.   Reason for Disposition  Nursing judgment  Protocols used: No Guideline or Reference Available-A-AH

## 2023-06-27 NOTE — Telephone Encounter (Signed)
 RTC to patient states was feeling better now is not feeling better.  Advised to go back to the ER.  Patient states will go back to the ER and call the Clinics if a follow up appointment is needed.

## 2023-06-29 DIAGNOSIS — Z419 Encounter for procedure for purposes other than remedying health state, unspecified: Secondary | ICD-10-CM | POA: Diagnosis not present

## 2023-07-01 ENCOUNTER — Other Ambulatory Visit (HOSPITAL_COMMUNITY): Payer: Self-pay

## 2023-07-04 ENCOUNTER — Encounter (HOSPITAL_COMMUNITY): Payer: Self-pay

## 2023-07-04 ENCOUNTER — Emergency Department (HOSPITAL_COMMUNITY)
Admission: EM | Admit: 2023-07-04 | Discharge: 2023-07-05 | Discharge status: Against Medical Advice | Attending: Emergency Medicine | Admitting: Emergency Medicine

## 2023-07-04 ENCOUNTER — Emergency Department (HOSPITAL_COMMUNITY)

## 2023-07-04 DIAGNOSIS — R059 Cough, unspecified: Secondary | ICD-10-CM | POA: Diagnosis not present

## 2023-07-04 DIAGNOSIS — Z5321 Procedure and treatment not carried out due to patient leaving prior to being seen by health care provider: Secondary | ICD-10-CM | POA: Insufficient documentation

## 2023-07-04 DIAGNOSIS — R062 Wheezing: Secondary | ICD-10-CM | POA: Diagnosis not present

## 2023-07-04 DIAGNOSIS — J449 Chronic obstructive pulmonary disease, unspecified: Secondary | ICD-10-CM | POA: Insufficient documentation

## 2023-07-04 DIAGNOSIS — R0602 Shortness of breath: Secondary | ICD-10-CM | POA: Diagnosis present

## 2023-07-04 LAB — BASIC METABOLIC PANEL
Anion gap: 8 (ref 5–15)
BUN: 15 mg/dL (ref 8–23)
CO2: 26 mmol/L (ref 22–32)
Calcium: 9.1 mg/dL (ref 8.9–10.3)
Chloride: 106 mmol/L (ref 98–111)
Creatinine, Ser: 1.15 mg/dL (ref 0.61–1.24)
GFR, Estimated: >60 mL/min (ref >60)
Glucose, Bld: 97 mg/dL (ref 70–99)
Potassium: 4.2 mmol/L (ref 3.5–5.1)
Sodium: 140 mmol/L (ref 135–145)

## 2023-07-04 LAB — CBC
HCT: 49.9 % (ref 39.0–52.0)
Hemoglobin: 16.1 g/dL (ref 13.0–17.0)
MCH: 30.6 pg (ref 26.0–34.0)
MCHC: 32.3 g/dL (ref 30.0–36.0)
MCV: 94.7 fL (ref 80.0–100.0)
Platelets: 299 10*3/uL (ref 150–400)
RBC: 5.27 MIL/uL (ref 4.22–5.81)
RDW: 13.8 % (ref 11.5–15.5)
WBC: 11.1 10*3/uL — ABNORMAL HIGH (ref 4.0–10.5)
nRBC: 0 % (ref 0.0–0.2)

## 2023-07-04 NOTE — ED Provider Triage Note (Signed)
 Emergency Medicine Provider Triage Evaluation Note  Bandy Honaker , a 63 y.o. male  was evaluated in triage.  Pt complains of shortness of breath, cough without chest pain.  History of COPD, recently treated for exacerbation last week, finished Medrol Dosepak, with return of symptoms for the last few days.  No improvement with home inhalers, nebulizer..  Review of Systems  Positive: shob Negative: Chest pain  Physical Exam  BP (!) 150/100 (BP Location: Right Arm)   Pulse (!) 106   Temp 98 F (36.7 C)   Resp (!) 22   Ht 6' (1.829 m)   Wt 77.1 kg   SpO2 94%   BMI 23.06 kg/m  Gen:   Awake, no distress   Resp:   MSK:   Moves extremities without difficulty  Other:  Diffuse wheezing throughout with increased effort, no focal consolidation noted  Medical Decision Making  Medically screening exam initiated at 4:05 PM.  Appropriate orders placed.  Diogenes Whirley was informed that the remainder of the evaluation will be completed by another provider, this initial triage assessment does not replace that evaluation, and the importance of remaining in the ED until their evaluation is complete.  Workup initiated in triage    Olene Floss, [place] 07/04/23 1607

## 2023-07-04 NOTE — ED Notes (Signed)
 Patient seen to be leaving by this RN. Patient LWBS.

## 2023-07-04 NOTE — ED Triage Notes (Signed)
 Pt c/o increasing SOB since 3/6.  Hx of COPD.  Pt reports he has been using prescribed medications w/o relief.  Pt reports he was seen recently for same, felt better when he left, but it feeling bad again.       Pt is easily speaking full sentences.

## 2023-07-06 ENCOUNTER — Other Ambulatory Visit: Payer: Self-pay | Admitting: Student

## 2023-07-06 ENCOUNTER — Other Ambulatory Visit (HOSPITAL_COMMUNITY): Payer: Self-pay

## 2023-07-06 ENCOUNTER — Encounter: Payer: Self-pay | Admitting: Cardiology

## 2023-07-06 MED ORDER — ATORVASTATIN CALCIUM 40 MG PO TABS
40.0000 mg | ORAL_TABLET | Freq: Every day | ORAL | 2 refills | Status: DC
  Filled 2023-07-06: qty 30, 30d supply, fill #0
  Filled 2023-08-09 (×2): qty 30, 30d supply, fill #1
  Filled 2023-09-13: qty 30, 30d supply, fill #2

## 2023-07-06 NOTE — Telephone Encounter (Signed)
 Medication sent to pharmacy

## 2023-07-08 ENCOUNTER — Other Ambulatory Visit: Payer: Self-pay

## 2023-07-13 ENCOUNTER — Other Ambulatory Visit (HOSPITAL_COMMUNITY): Payer: Self-pay

## 2023-07-13 ENCOUNTER — Other Ambulatory Visit (HOSPITAL_BASED_OUTPATIENT_CLINIC_OR_DEPARTMENT_OTHER): Payer: Self-pay

## 2023-07-13 ENCOUNTER — Other Ambulatory Visit: Payer: Self-pay

## 2023-07-14 ENCOUNTER — Encounter (HOSPITAL_COMMUNITY): Payer: Self-pay | Admitting: Emergency Medicine

## 2023-07-14 ENCOUNTER — Emergency Department (HOSPITAL_COMMUNITY)

## 2023-07-14 ENCOUNTER — Emergency Department (HOSPITAL_COMMUNITY): Admission: EM | Admit: 2023-07-14 | Discharge: 2023-07-14 | Disposition: A | Discharge status: Home / Self Care

## 2023-07-14 DIAGNOSIS — Z7982 Long term (current) use of aspirin: Secondary | ICD-10-CM | POA: Diagnosis not present

## 2023-07-14 DIAGNOSIS — Z8673 Personal history of transient ischemic attack (TIA), and cerebral infarction without residual deficits: Secondary | ICD-10-CM | POA: Diagnosis not present

## 2023-07-14 DIAGNOSIS — J441 Chronic obstructive pulmonary disease with (acute) exacerbation: Secondary | ICD-10-CM | POA: Diagnosis not present

## 2023-07-14 DIAGNOSIS — I2510 Atherosclerotic heart disease of native coronary artery without angina pectoris: Secondary | ICD-10-CM | POA: Diagnosis not present

## 2023-07-14 DIAGNOSIS — I509 Heart failure, unspecified: Secondary | ICD-10-CM | POA: Insufficient documentation

## 2023-07-14 DIAGNOSIS — R0602 Shortness of breath: Secondary | ICD-10-CM | POA: Diagnosis present

## 2023-07-14 MED ORDER — ALBUTEROL SULFATE (2.5 MG/3ML) 0.083% IN NEBU
5.0000 mg | INHALATION_SOLUTION | Freq: Once | RESPIRATORY_TRACT | Status: AC
  Administered 2023-07-14: 5 mg via RESPIRATORY_TRACT
  Filled 2023-07-14: qty 6

## 2023-07-14 MED ORDER — IPRATROPIUM BROMIDE 0.02 % IN SOLN
0.5000 mg | Freq: Once | RESPIRATORY_TRACT | Status: AC
  Administered 2023-07-14: 0.5 mg via RESPIRATORY_TRACT
  Filled 2023-07-14: qty 2.5

## 2023-07-14 MED ORDER — MAGNESIUM SULFATE 2 GM/50ML IV SOLN
2.0000 g | Freq: Once | INTRAVENOUS | Status: AC
  Administered 2023-07-14: 2 g via INTRAVENOUS
  Filled 2023-07-14: qty 50

## 2023-07-14 NOTE — ED Notes (Signed)
 X-ray at bedside

## 2023-07-14 NOTE — ED Notes (Signed)
 CCMD called.

## 2023-07-14 NOTE — ED Notes (Signed)
 Pt ambulated with pulse ox. O2 sat stayed between 95-98%... EDP advised

## 2023-07-14 NOTE — ED Provider Notes (Signed)
 [place] EMERGENCY DEPARTMENT AT [facility] Provider Note   CSN: [id number] Arrival date & time: 07/14/23  1200     History  Chief Complaint  Patient presents with   Shortness of Breath    Francisco [name] is a 63 y.o. male.  This is a 63 year old male presenting emergency department for shortness of breath.  Has a history of COPD, not on oxygen at home.  Reports that he is had worsening shortness of breath for the past few days.  Reportedly has not had his steroid inhaler.  His shortness of breath seemingly acutely worsened today.  Received albuterol, Atrovent and Solu-Medrol prior to arrival.  He reports roughly 40% improvement of his shortness of breath/breathing.   Shortness of Breath      Home Medications Prior to Admission medications   Medication Sig Start Date End Date Taking? Authorizing Provider  albuterol (PROVENTIL) (2.5 MG/3ML) 0.083% nebulizer solution Use 1 vial (2.5 mg total) by nebulization every 6 (six) hours as needed for wheezing or shortness of breath. 06/16/23   Atway, Rayann N, DO  albuterol (VENTOLIN HFA) 108 (90 Base) MCG/ACT inhaler Inhale 2 puffs into the lungs every 4 (four) hours as needed for wheezing or shortness of breath. 06/10/23   Nooruddin, Jason Fila, MD  amLODipine (NORVASC) 5 MG tablet Take 1 tablet (5 mg total) by mouth daily. 03/24/23   Nooruddin, Jason Fila, MD  aspirin EC 81 MG tablet Take 1 tablet (81 mg total) by mouth daily. Swallow whole. 02/15/23   Peterson Ao, MD  atorvastatin (LIPITOR) 40 MG tablet Take 1 tablet (40 mg total) by mouth daily. 07/06/23   [name], [name], DO  Budesonide (PULMICORT FLEXHALER) 90 MCG/ACT inhaler Inhale 1 puff into the lungs 2 (two) times daily. 02/17/23   Nooruddin, Jason Fila, MD  budesonide-formoterol [facility]) 160-4.5 MCG/ACT inhaler Inhale 2 puffs into the lungs in the morning and at bedtime. 04/29/23   [name], [name], DO  lidocaine (LIDODERM) 5 % Place 2 patches onto the skin daily. Remove & Discard patch  within 12 hours or as directed by MD Patient not taking: Reported on 02/14/2023 02/11/23   Peterson Ao, MD  lisinopril (ZESTRIL) 40 MG tablet Take 1/2 tablet (20 mg total) by mouth daily. 06/08/23   Kathleen Lime, MD  lovastatin (MEVACOR) 20 MG tablet Take 1 tablet (20 mg total) by mouth daily. 06/21/23   Nooruddin, Jason Fila, MD  methylPREDNISolone (MEDROL DOSEPAK) 4 MG TBPK tablet Take 6 tablets (60 mg total) by mouth on day 1, then 5 tablets (50 mg total) on day 2, then 4 tablets (40 mg total) on day 3, then 3 tablets (30 mg total) on day 4, then 2 tablets (20 mg total) on day 5, and then 1 tablet (10 mg total) on day 6. 06/24/23   Arthor Captain, PA-C  metoprolol tartrate (LOPRESSOR) 25 MG tablet Take 1 tablet (25 mg total) by mouth 2 (two) times daily. 06/08/23 09/06/23  Kathleen Lime, MD  nicotine (NICODERM CQ - DOSED IN MG/24 HOURS) 14 mg/24hr patch Place 1 patch (14 mg total) onto the skin daily. Patient not taking: Reported on 02/14/2023 06/24/21   Adron Bene, MD  nitroGLYCERIN (NITROSTAT) 0.4 MG SL tablet Place 1 tablet (0.4 mg total) under the tongue every 5 (five) minutes x 3 doses as needed for chest pain. 10/14/22   Nooruddin, Jason Fila, MD      Allergies    Ivp dye [iodinated contrast media], Tramadol, and Doxycycline    Review of Systems   Review  of Systems  Respiratory:  Positive for shortness of breath.     Physical Exam Updated Vital Signs BP (!) 140/93   Pulse (!) 101   Temp 97.9 F (36.6 C) (Oral)   Resp (!) 25   SpO2 95%  Physical Exam Vitals and nursing note reviewed.  Constitutional:      General: He is not in acute distress.    Appearance: He is not toxic-appearing.  HENT:     Head: Normocephalic.  Cardiovascular:     Rate and Rhythm: Normal rate and regular rhythm.  Pulmonary:     Effort: Pulmonary effort is normal. No respiratory distress.     Breath sounds: Wheezing present.  Abdominal:     Palpations: Abdomen is soft.  Musculoskeletal:        General:  Normal range of motion.     Right lower leg: No edema.     Left lower leg: No edema.  Neurological:     General: No focal deficit present.     Mental Status: He is alert.  Psychiatric:        Mood and Affect: Mood normal.        Behavior: Behavior normal.     ED Results / Procedures / Treatments   Labs (all labs ordered are listed, but only abnormal results are displayed) Labs Reviewed - No data to display  EKG EKG Interpretation Date/Time:  Thursday July 14 2023 12:15:06 EDT Ventricular Rate:  103 PR Interval:  162 QRS Duration:  75 QT Interval:  336 QTC Calculation: 440 R Axis:   46  Text Interpretation: Sinus tachycardia Ventricular premature complex Aberrant conduction of SV complex(es) Right atrial enlargement Minimal ST depression, inferior leads Confirmed by Estanislado Pandy [phone]) on 07/14/2023 12:35:48 PM  Radiology DG Chest Portable 1 View Result Date: 07/14/2023 CLINICAL DATA:  Shortness of breath EXAM: PORTABLE CHEST 1 VIEW COMPARISON:  Chest radiograph dated 07/04/2023 FINDINGS: Normal lung volumes. Irregular right apical densities. No pleural effusion or pneumothorax. The heart size and mediastinal contours are within normal limits. No acute osseous abnormality. IMPRESSION: Irregular right apical densities, which may represent scarring or pulmonary nodules. Recommend further evaluation with chest CT. Electronically Signed   By: Agustin Cree M.D.   On: 07/14/2023 13:38    Procedures Procedures    Medications Ordered in ED Medications  albuterol (PROVENTIL) (2.5 MG/3ML) 0.083% nebulizer solution 5 mg (5 mg Nebulization Given 07/14/23 1258)  ipratropium (ATROVENT) nebulizer solution 0.5 mg (0.5 mg Nebulization Given 07/14/23 1258)  magnesium sulfate IVPB 2 g 50 mL (0 g Intravenous Stopped 07/14/23 1400)    ED Course/ Medical Decision Making/ A&P Clinical Course as of 07/14/23 1637  Thu Jul 14, 2023  1319 DG Chest Portable 1 View Independently reviewed.  No  pneumothorax.  No overt pneumonia.  No widened mediastinum to suggest dissection.  Does have flattening of the diaphragm consistent with COPD exacerbation. [TY]  1358 DG Chest Portable 1 View IMPRESSION: Irregular right apical densities, which may represent scarring or pulmonary nodules. Recommend further evaluation with chest CT.   Electronically Signed   By: Agustin Cree M.D.   [TY]  1416 Feeling better. Ambulated without Desat. Asking to leave. Will discharge.  [TY]    Clinical Course User Index [TY] Coral Spikes, DO                                 Medical Decision Making  This is a 63 year old male history of COPD presenting to the emergency department for shortness of breath.  Per chart review does have a history of CHF, CAD, recent stroke.  EMS reported giving Solu-Medrol, albuterol and Atrovent prior to arrival.  Patient is noting improvement.  Does not appear to be in overt respiratory distress, speaking in full sentences.  He does have diffuse wheezing on exam with poor air movement.  Clinically does not appear to be fluid overloaded or in acute decompensated heart failure.  Will get chest x-ray to evaluate for pneumonia/pulmonary edema.  Will give second breathing treatment.  Patient does note that last time he had shortness of breath that IV magnesium significantly improved his symptoms.  Will therefore give magnesium.  Will continue to monitor.  See ED course for final MDM/disposition.  Amount and/or Complexity of Data Reviewed Radiology: ordered. Decision-making details documented in ED Course.  Risk Prescription drug management.         Final Clinical Impression(s) / ED Diagnoses Final diagnoses:  COPD exacerbation [facility])    Rx / DC Orders ED Discharge Orders     None         Coral Spikes, DO 07/14/23 1637

## 2023-07-14 NOTE — ED Triage Notes (Signed)
 Pt here for [facility] for a couple of days. Hx of COPD. Pt unable to get steroid inhaler. EMS gave 0.5 Atrovent, 5mg  of albuterol, 125 solumedrol. Axox4. 99% room air.

## 2023-07-14 NOTE — Discharge Instructions (Signed)
 Please follow-up with your primary doctor.  Turn immediately felt fevers, chills, chest pain, worsening shortness of breath, you pass out or you develop any new or worsening symptoms that are concerning to you.

## 2023-07-15 ENCOUNTER — Other Ambulatory Visit: Payer: Self-pay

## 2023-07-15 ENCOUNTER — Other Ambulatory Visit (HOSPITAL_COMMUNITY): Payer: Self-pay

## 2023-07-15 MED ORDER — ALBUTEROL SULFATE HFA 108 (90 BASE) MCG/ACT IN AERS
2.0000 | INHALATION_SPRAY | RESPIRATORY_TRACT | 1 refills | Status: DC
  Filled 2023-07-15: qty 18, 17d supply, fill #0
  Filled 2023-08-03: qty 18, 17d supply, fill #1

## 2023-07-17 ENCOUNTER — Emergency Department (HOSPITAL_COMMUNITY)
Admission: EM | Admit: 2023-07-17 | Discharge: 2023-07-17 | Disposition: A | Discharge status: Home / Self Care | Attending: Emergency Medicine | Admitting: Emergency Medicine

## 2023-07-17 ENCOUNTER — Emergency Department (HOSPITAL_COMMUNITY)

## 2023-07-17 DIAGNOSIS — R0602 Shortness of breath: Secondary | ICD-10-CM | POA: Diagnosis present

## 2023-07-17 DIAGNOSIS — Z79899 Other long term (current) drug therapy: Secondary | ICD-10-CM | POA: Diagnosis not present

## 2023-07-17 DIAGNOSIS — Z7982 Long term (current) use of aspirin: Secondary | ICD-10-CM | POA: Diagnosis not present

## 2023-07-17 DIAGNOSIS — J441 Chronic obstructive pulmonary disease with (acute) exacerbation: Secondary | ICD-10-CM | POA: Diagnosis not present

## 2023-07-17 LAB — CBC WITH DIFFERENTIAL/PLATELET
Abs Immature Granulocytes: 0.03 10*3/uL (ref 0.00–0.07)
Basophils Absolute: 0.1 10*3/uL (ref 0.0–0.1)
Basophils Relative: 1 %
Eosinophils Absolute: 0.2 10*3/uL (ref 0.0–0.5)
Eosinophils Relative: 2 %
HCT: 45.1 % (ref 39.0–52.0)
Hemoglobin: 14.7 g/dL (ref 13.0–17.0)
Immature Granulocytes: 0 %
Lymphocytes Relative: 35 %
Lymphs Abs: 3.1 10*3/uL (ref 0.7–4.0)
MCH: 31.1 pg (ref 26.0–34.0)
MCHC: 32.6 g/dL (ref 30.0–36.0)
MCV: 95.6 fL (ref 80.0–100.0)
Monocytes Absolute: 1 10*3/uL (ref 0.1–1.0)
Monocytes Relative: 11 %
Neutro Abs: 4.7 10*3/uL (ref 1.7–7.7)
Neutrophils Relative %: 51 %
Platelets: 240 10*3/uL (ref 150–400)
RBC: 4.72 MIL/uL (ref 4.22–5.81)
RDW: 13.7 % (ref 11.5–15.5)
WBC: 9 10*3/uL (ref 4.0–10.5)
nRBC: 0 % (ref 0.0–0.2)

## 2023-07-17 LAB — I-STAT VENOUS BLOOD GAS, ED
Acid-Base Excess: 2 mmol/L (ref 0.0–2.0)
Bicarbonate: 27.4 mmol/L (ref 20.0–28.0)
Calcium, Ion: 1.1 mmol/L — ABNORMAL LOW (ref 1.15–1.40)
HCT: 43 % (ref 39.0–52.0)
Hemoglobin: 14.6 g/dL (ref 13.0–17.0)
O2 Saturation: 99 %
Potassium: 3.8 mmol/L (ref 3.5–5.1)
Sodium: 141 mmol/L (ref 135–145)
TCO2: 29 mmol/L (ref 22–32)
pCO2, Ven: 42.7 mmHg — ABNORMAL LOW (ref 44–60)
pH, Ven: 7.414 (ref 7.25–7.43)
pO2, Ven: 121 mmHg — ABNORMAL HIGH (ref 32–45)

## 2023-07-17 LAB — I-STAT CHEM 8, ED
BUN: 16 mg/dL (ref 8–23)
Calcium, Ion: 1.12 mmol/L — ABNORMAL LOW (ref 1.15–1.40)
Chloride: 106 mmol/L (ref 98–111)
Creatinine, Ser: 0.9 mg/dL (ref 0.61–1.24)
Glucose, Bld: 112 mg/dL — ABNORMAL HIGH (ref 70–99)
HCT: 44 % (ref 39.0–52.0)
Hemoglobin: 15 g/dL (ref 13.0–17.0)
Potassium: 3.8 mmol/L (ref 3.5–5.1)
Sodium: 141 mmol/L (ref 135–145)
TCO2: 26 mmol/L (ref 22–32)

## 2023-07-17 LAB — BRAIN NATRIURETIC PEPTIDE: B Natriuretic Peptide: 28.5 pg/mL (ref 0.0–100.0)

## 2023-07-17 LAB — BASIC METABOLIC PANEL WITH GFR
Anion gap: 9 (ref 5–15)
BUN: 14 mg/dL (ref 8–23)
CO2: 25 mmol/L (ref 22–32)
Calcium: 9 mg/dL (ref 8.9–10.3)
Chloride: 107 mmol/L (ref 98–111)
Creatinine, Ser: 0.87 mg/dL (ref 0.61–1.24)
GFR, Estimated: >60 mL/min (ref >60)
Glucose, Bld: 113 mg/dL — ABNORMAL HIGH (ref 70–99)
Potassium: 3.8 mmol/L (ref 3.5–5.1)
Sodium: 141 mmol/L (ref 135–145)

## 2023-07-17 LAB — TROPONIN I (HIGH SENSITIVITY): Troponin I (High Sensitivity): 10 ng/L (ref <18)

## 2023-07-17 MED ORDER — MAGNESIUM SULFATE IN D5W 1-5 GM/100ML-% IV SOLN
1.0000 g | Freq: Once | INTRAVENOUS | Status: AC
  Administered 2023-07-17: 1 g via INTRAVENOUS
  Filled 2023-07-17: qty 100

## 2023-07-17 MED ORDER — PREDNISONE 10 MG PO TABS: 40.0000 mg | ORAL_TABLET | Freq: Every day | ORAL | 0 refills | Status: DC

## 2023-07-17 NOTE — Discharge Instructions (Signed)
 Return for any problem.  ?

## 2023-07-17 NOTE — ED Provider Notes (Signed)
 [place] EMERGENCY DEPARTMENT AT [facility] Provider Note   CSN: [id number] Arrival date & time: 07/17/23  1707     History {Add pertinent medical, surgical, social history, OB history to HPI:1} Chief Complaint  Patient presents with   Shortness of Breath    Francisco [name] is a 63 y.o. male.   Shortness of Breath      Home Medications Prior to Admission medications   Medication Sig Start Date End Date Taking? Authorizing Provider  albuterol (PROVENTIL) (2.5 MG/3ML) 0.083% nebulizer solution Use 1 vial (2.5 mg total) by nebulization every 6 (six) hours as needed for wheezing or shortness of breath. 06/16/23   Atway, Rayann N, DO  albuterol (VENTOLIN HFA) 108 (90 Base) MCG/ACT inhaler Inhale 2 puffs into the lungs every 4 (four) hours as needed for wheezing or shortness of breath. 06/10/23   Nooruddin, Jason Fila, MD  albuterol (VENTOLIN HFA) 108 (90 Base) MCG/ACT inhaler Inhale 2 puffs into the lungs every 4 (four) hours as needed for wheezing or shortness of breath. 06/10/23   Nooruddin, Jason Fila, MD  amLODipine (NORVASC) 5 MG tablet Take 1 tablet (5 mg total) by mouth daily. 03/24/23   Nooruddin, Jason Fila, MD  aspirin EC 81 MG tablet Take 1 tablet (81 mg total) by mouth daily. Swallow whole. 02/15/23   Peterson Ao, MD  atorvastatin (LIPITOR) 40 MG tablet Take 1 tablet (40 mg total) by mouth daily. 07/06/23   [name], [name], DO  Budesonide (PULMICORT FLEXHALER) 90 MCG/ACT inhaler Inhale 1 puff into the lungs 2 (two) times daily. 02/17/23   Nooruddin, Jason Fila, MD  budesonide-formoterol [facility]) 160-4.5 MCG/ACT inhaler Inhale 2 puffs into the lungs in the morning and at bedtime. 04/29/23   [name], [name], DO  lidocaine (LIDODERM) 5 % Place 2 patches onto the skin daily. Remove & Discard patch within 12 hours or as directed by MD Patient not taking: Reported on 02/14/2023 02/11/23   Peterson Ao, MD  lisinopril (ZESTRIL) 40 MG tablet Take 1/2 tablet (20 mg total) by mouth daily.  06/08/23   Kathleen Lime, MD  lovastatin (MEVACOR) 20 MG tablet Take 1 tablet (20 mg total) by mouth daily. 06/21/23   Nooruddin, Jason Fila, MD  methylPREDNISolone (MEDROL DOSEPAK) 4 MG TBPK tablet Take 6 tablets (60 mg total) by mouth on day 1, then 5 tablets (50 mg total) on day 2, then 4 tablets (40 mg total) on day 3, then 3 tablets (30 mg total) on day 4, then 2 tablets (20 mg total) on day 5, and then 1 tablet (10 mg total) on day 6. 06/24/23   Arthor Captain, PA-C  metoprolol tartrate (LOPRESSOR) 25 MG tablet Take 1 tablet (25 mg total) by mouth 2 (two) times daily. 06/08/23 09/06/23  Kathleen Lime, MD  nicotine (NICODERM CQ - DOSED IN MG/24 HOURS) 14 mg/24hr patch Place 1 patch (14 mg total) onto the skin daily. Patient not taking: Reported on 02/14/2023 06/24/21   Adron Bene, MD  nitroGLYCERIN (NITROSTAT) 0.4 MG SL tablet Place 1 tablet (0.4 mg total) under the tongue every 5 (five) minutes x 3 doses as needed for chest pain. 10/14/22   Nooruddin, Jason Fila, MD      Allergies    Ivp dye [iodinated contrast media], Tramadol, and Doxycycline    Review of Systems   Review of Systems  Respiratory:  Positive for shortness of breath.     Physical Exam Updated Vital Signs There were no vitals taken for this visit. Physical Exam  ED Results / Procedures /  Treatments   Labs (all labs ordered are listed, but only abnormal results are displayed) Labs Reviewed  CBC WITH DIFFERENTIAL/PLATELET  BRAIN NATRIURETIC PEPTIDE  BASIC METABOLIC PANEL WITH GFR  I-STAT VENOUS BLOOD GAS, ED  I-STAT CHEM 8, ED  TROPONIN I (HIGH SENSITIVITY)    EKG EKG Interpretation Date/Time:  Sunday July 17 2023 17:15:02 EDT Ventricular Rate:  100 PR Interval:  144 QRS Duration:  76 QT Interval:  333 QTC Calculation: 430 R Axis:   58  Text Interpretation: Sinus tachycardia RAE, consider biatrial enlargement Confirmed by Kristine Royal [phone]) on 07/17/2023 5:19:26 PM  Radiology No results  found.  Procedures Procedures  {Document cardiac monitor, telemetry assessment procedure when appropriate:1}  Medications Ordered in ED Medications  magnesium sulfate IVPB 1 g 100 mL (has no administration in time range)    ED Course/ Medical Decision Making/ A&P   {   Click here for ABCD2, HEART and other calculatorsREFRESH Note before signing :1}                              Medical Decision Making Amount and/or Complexity of Data Reviewed Labs: ordered. Radiology: ordered.  Risk Prescription drug management.   ***  {Document critical care time when appropriate:1} {Document review of labs and clinical decision tools ie heart score, Chads2Vasc2 etc:1}  {Document your independent review of radiology images, and any outside records:1} {Document your discussion with family members, caretakers, and with consultants:1} {Document social determinants of health affecting pt's care:1} {Document your decision making why or why not admission, treatments were needed:1} Final Clinical Impression(s) / ED Diagnoses Final diagnoses:  None    Rx / DC Orders ED Discharge Orders     None

## 2023-07-17 NOTE — ED Triage Notes (Signed)
 BIB [facility] from home for [facility] ([place]), Hx of COPD, Patient stated he was working outside with the pollen and it may have exacerbated his COPD, Patient had no relief with his inhaler. EMS gave 2 duo neb treatments and 125 mg of solumedrol, Patient states that it helped a little but not much.

## 2023-07-20 ENCOUNTER — Ambulatory Visit: Payer: Self-pay | Admitting: *Deleted

## 2023-07-20 ENCOUNTER — Other Ambulatory Visit (HOSPITAL_COMMUNITY): Payer: Self-pay

## 2023-07-20 ENCOUNTER — Encounter (HOSPITAL_COMMUNITY): Payer: Self-pay

## 2023-07-20 ENCOUNTER — Emergency Department (HOSPITAL_COMMUNITY)
Admission: EM | Admit: 2023-07-20 | Discharge: 2023-07-20 | Disposition: A | Discharge status: Home / Self Care | Attending: Emergency Medicine | Admitting: Emergency Medicine

## 2023-07-20 ENCOUNTER — Emergency Department (HOSPITAL_COMMUNITY)

## 2023-07-20 ENCOUNTER — Other Ambulatory Visit: Payer: Self-pay

## 2023-07-20 DIAGNOSIS — F17200 Nicotine dependence, unspecified, uncomplicated: Secondary | ICD-10-CM | POA: Insufficient documentation

## 2023-07-20 DIAGNOSIS — Z8673 Personal history of transient ischemic attack (TIA), and cerebral infarction without residual deficits: Secondary | ICD-10-CM | POA: Diagnosis not present

## 2023-07-20 DIAGNOSIS — Z7951 Long term (current) use of inhaled steroids: Secondary | ICD-10-CM | POA: Insufficient documentation

## 2023-07-20 DIAGNOSIS — I509 Heart failure, unspecified: Secondary | ICD-10-CM | POA: Insufficient documentation

## 2023-07-20 DIAGNOSIS — J441 Chronic obstructive pulmonary disease with (acute) exacerbation: Secondary | ICD-10-CM | POA: Insufficient documentation

## 2023-07-20 DIAGNOSIS — Z7982 Long term (current) use of aspirin: Secondary | ICD-10-CM | POA: Diagnosis not present

## 2023-07-20 DIAGNOSIS — R0602 Shortness of breath: Secondary | ICD-10-CM | POA: Diagnosis present

## 2023-07-20 LAB — BASIC METABOLIC PANEL WITH GFR
Anion gap: 9 (ref 5–15)
BUN: 6 mg/dL — ABNORMAL LOW (ref 8–23)
CO2: 27 mmol/L (ref 22–32)
Calcium: 9 mg/dL (ref 8.9–10.3)
Chloride: 101 mmol/L (ref 98–111)
Creatinine, Ser: 0.92 mg/dL (ref 0.61–1.24)
GFR, Estimated: >60 mL/min (ref >60)
Glucose, Bld: 118 mg/dL — ABNORMAL HIGH (ref 70–99)
Potassium: 3.3 mmol/L — ABNORMAL LOW (ref 3.5–5.1)
Sodium: 137 mmol/L (ref 135–145)

## 2023-07-20 LAB — D-DIMER, QUANTITATIVE: D-Dimer, Quant: <0.27 ug{FEU}/mL (ref 0.00–0.50)

## 2023-07-20 LAB — CBC WITH DIFFERENTIAL/PLATELET
Abs Immature Granulocytes: 0.07 10*3/uL (ref 0.00–0.07)
Basophils Absolute: 0.1 10*3/uL (ref 0.0–0.1)
Basophils Relative: 1 %
Eosinophils Absolute: 0.1 10*3/uL (ref 0.0–0.5)
Eosinophils Relative: 1 %
HCT: 45.5 % (ref 39.0–52.0)
Hemoglobin: 15 g/dL (ref 13.0–17.0)
Immature Granulocytes: 1 %
Lymphocytes Relative: 20 %
Lymphs Abs: 1.8 10*3/uL (ref 0.7–4.0)
MCH: 30.8 pg (ref 26.0–34.0)
MCHC: 33 g/dL (ref 30.0–36.0)
MCV: 93.4 fL (ref 80.0–100.0)
Monocytes Absolute: 1.3 10*3/uL — ABNORMAL HIGH (ref 0.1–1.0)
Monocytes Relative: 15 %
Neutro Abs: 5.6 10*3/uL (ref 1.7–7.7)
Neutrophils Relative %: 62 %
Platelets: 282 10*3/uL (ref 150–400)
RBC: 4.87 MIL/uL (ref 4.22–5.81)
RDW: 13.5 % (ref 11.5–15.5)
WBC: 9 10*3/uL (ref 4.0–10.5)
nRBC: 0 % (ref 0.0–0.2)

## 2023-07-20 LAB — BRAIN NATRIURETIC PEPTIDE: B Natriuretic Peptide: 63.9 pg/mL (ref 0.0–100.0)

## 2023-07-20 LAB — TROPONIN I (HIGH SENSITIVITY): Troponin I (High Sensitivity): 13 ng/L (ref <18)

## 2023-07-20 LAB — RESP PANEL BY RT-PCR (RSV, FLU A&B, COVID)  RVPGX2
Influenza A by PCR: NEGATIVE
Influenza B by PCR: NEGATIVE
Resp Syncytial Virus by PCR: NEGATIVE
SARS Coronavirus 2 by RT PCR: NEGATIVE

## 2023-07-20 MED ORDER — MAGNESIUM SULFATE 2 GM/50ML IV SOLN
2.0000 g | Freq: Once | INTRAVENOUS | Status: AC
  Administered 2023-07-20: 2 g via INTRAVENOUS
  Filled 2023-07-20: qty 50

## 2023-07-20 MED ORDER — METHYLPREDNISOLONE 4 MG PO TBPK
ORAL_TABLET | ORAL | 0 refills | Status: DC
  Filled 2023-07-20: qty 21, 6d supply, fill #0

## 2023-07-20 NOTE — ED Provider Notes (Signed)
 Received signout from previous provider, please see her note for complete H&P.  63 year old male history of CHF and COPD presenting with complaints of shortness of breath.  Symptoms seems to be ongoing for the past several days.  Was last seen in the ED 2 days ago for same got better but symptoms returned.  He did receive DuoNebs and steroids via EMS on rounds and reported feeling better.  Today workup was overall reassuring.  Negative D-dimer low suspicion for PE, negative respiratory panel, normal BNP, electrolytes overall reassuring and EKG without acute ischemic changes.  Chest x-ray independently interpreted by me without any acute finding.  Hyperinflation likely in the setting of COPD.  Exam reassuring.  Slightly diminished lung sounds but no significant wheezes rales or rhonchi heard.  Suspect his symptoms is due to a COPD exacerbation.  Patient states he felt much improvement especially with receiving magnesium.  Will discharge home with a Medrol dose pack and encouraged patient to follow-up closely with his provider for outpatient care.  Return precaution given.  Hospital admission considered but patient is stable for discharge.  Social determinant of health including food insecurity and tobacco use.  BP 130/89   Pulse (!) 110   Temp 98.7 F (37.1 C) (Oral)   Resp (!) 25   Ht 6' (1.829 m)   Wt 79.4 kg   SpO2 94%   BMI 23.73 kg/m   Results for orders placed or performed during the hospital encounter of 07/20/23  Basic metabolic panel   Collection Time: 07/20/23  1:04 PM  Result Value Ref Range   Sodium 137 135 - 145 mmol/L   Potassium 3.3 (L) 3.5 - 5.1 mmol/L   Chloride 101 98 - 111 mmol/L   CO2 27 22 - 32 mmol/L   Glucose, Bld 118 (H) 70 - 99 mg/dL   BUN 6 (L) 8 - 23 mg/dL   Creatinine, Ser 1.61 0.61 - 1.24 mg/dL   Calcium 9.0 8.9 - 09.6 mg/dL   GFR, Estimated >04 >54 mL/min   Anion gap 9 5 - 15  CBC with Differential   Collection Time: 07/20/23  1:04 PM  Result Value  Ref Range   WBC 9.0 4.0 - 10.5 K/uL   RBC 4.87 4.22 - 5.81 MIL/uL   Hemoglobin 15.0 13.0 - 17.0 g/dL   HCT 09.8 11.9 - 14.7 %   MCV 93.4 80.0 - 100.0 fL   MCH 30.8 26.0 - 34.0 pg   MCHC 33.0 30.0 - 36.0 g/dL   RDW 82.9 56.2 - 13.0 %   Platelets 282 150 - 400 K/uL   nRBC 0.0 0.0 - 0.2 %   Neutrophils Relative % 62 %   Neutro Abs 5.6 1.7 - 7.7 K/uL   Lymphocytes Relative 20 %   Lymphs Abs 1.8 0.7 - 4.0 K/uL   Monocytes Relative 15 %   Monocytes Absolute 1.3 (H) 0.1 - 1.0 K/uL   Eosinophils Relative 1 %   Eosinophils Absolute 0.1 0.0 - 0.5 K/uL   Basophils Relative 1 %   Basophils Absolute 0.1 0.0 - 0.1 K/uL   Immature Granulocytes 1 %   Abs Immature Granulocytes 0.07 0.00 - 0.07 K/uL  Brain natriuretic peptide   Collection Time: 07/20/23  1:04 PM  Result Value Ref Range   B Natriuretic Peptide 63.9 0.0 - 100.0 pg/mL  Troponin I (High Sensitivity)   Collection Time: 07/20/23  1:04 PM  Result Value Ref Range   Troponin I (High Sensitivity) 13 <18 ng/L  Resp panel by RT-PCR (RSV, Flu A&B, Covid) Anterior Nasal Swab   Collection Time: 07/20/23  1:08 PM   Specimen: Anterior Nasal Swab  Result Value Ref Range   SARS Coronavirus 2 by RT PCR NEGATIVE NEGATIVE   Influenza A by PCR NEGATIVE NEGATIVE   Influenza B by PCR NEGATIVE NEGATIVE   Resp Syncytial Virus by PCR NEGATIVE NEGATIVE  D-dimer, quantitative   Collection Time: 07/20/23  1:30 PM  Result Value Ref Range   D-Dimer, Quant <0.27 0.00 - 0.50 ug/mL-FEU   DG Chest 2 View Result Date: 07/20/2023 CLINICAL DATA:  Shortness of breath. EXAM: CHEST - 2 VIEW COMPARISON:  Chest radiograph dated 07/17/2023. FINDINGS: Stable cardiomediastinal silhouette. Hyperinflation. No focal consolidation, pleural effusion, or pneumothorax. Similar slight compression deformities of the midthoracic spine. No acute osseous abnormality. IMPRESSION: Hyperinflation.  No acute cardiopulmonary findings. Electronically Signed   By: Hart Robinsons M.D.    On: 07/20/2023 14:58   DG Chest Port 1 View Result Date: 07/17/2023 CLINICAL DATA:  Shortness of breath EXAM: PORTABLE CHEST 1 VIEW COMPARISON:  July 14, 2023, July 04, 2023 FINDINGS: The cardiomediastinal silhouette is unchanged in contour. No pleural effusion. No pneumothorax. No acute pleuroparenchymal abnormality. IMPRESSION: No acute cardiopulmonary abnormality. Previously described RIGHT apical nodular opacities are less conspicuous on current exam. Follow-up chest CT would be recommended given history of COPD. Electronically Signed   By: Meda Klinefelter M.D.   On: 07/17/2023 17:46   DG Chest Portable 1 View Result Date: 07/14/2023 CLINICAL DATA:  Shortness of breath EXAM: PORTABLE CHEST 1 VIEW COMPARISON:  Chest radiograph dated 07/04/2023 FINDINGS: Normal lung volumes. Irregular right apical densities. No pleural effusion or pneumothorax. The heart size and mediastinal contours are within normal limits. No acute osseous abnormality. IMPRESSION: Irregular right apical densities, which may represent scarring or pulmonary nodules. Recommend further evaluation with chest CT. Electronically Signed   By: Agustin Cree M.D.   On: 07/14/2023 13:38   DG Chest 2 View Result Date: 07/04/2023 CLINICAL DATA:  Increasing shortness of breath since 06/23/2023, COPD EXAM: CHEST - 2 VIEW COMPARISON:  06/24/2023 FINDINGS: Frontal and lateral views of the chest demonstrate an unremarkable cardiac silhouette. No airspace disease, effusion, or pneumothorax. No acute bony abnormalities. IMPRESSION: 1. No acute intrathoracic process. Electronically Signed   By: Sharlet Salina M.D.   On: 07/04/2023 17:16   DG Chest 2 View Result Date: 06/24/2023 CLINICAL DATA:  Shortness of breath EXAM: CHEST - 2 VIEW COMPARISON:  05/27/2023 and older FINDINGS: Hyperinflation. No consolidation, pneumothorax or effusion. No edema. Normal cardiopericardial silhouette. Overlapping cardiac leads. Slight compression deformities along the  midthoracic spine are stable going back to a CT of 08/13/2021. IMPRESSION: Hyperinflation.  No acute cardiopulmonary disease. Electronically Signed   By: Karen Kays M.D.   On: 06/24/2023 10:11   US Abdomen Limited RUQ (LIVER/GB) Result Date: 06/24/2023 CLINICAL DATA:  Right upper quadrant pain EXAM: ULTRASOUND ABDOMEN LIMITED RIGHT UPPER QUADRANT COMPARISON:  None Available. FINDINGS: Gallbladder: No gallstones or wall thickening visualized. No sonographic Murphy sign noted by sonographer. Common bile duct: Diameter: 2 mm Liver: No focal lesion identified. Within normal limits in parenchymal echogenicity. Portal vein is patent on color Doppler imaging with normal direction of blood flow towards the liver. Other: None. IMPRESSION: No gallstones or ductal dilatation. Electronically Signed   By: Karen Kays M.D.   On: 06/24/2023 10:09      Fayrene Helper, PA-C 07/20/23 1710    Pricilla Loveless, MD 07/22/23 [phone]

## 2023-07-20 NOTE — ED Provider Notes (Signed)
 [place] EMERGENCY DEPARTMENT AT [facility] Provider Note   CSN: [id number] Arrival date & time: 07/20/23  1255     History  Chief Complaint  Patient presents with   Shortness of Breath    Francisco [name] is a 63 y.o. male with a history of cardiomyopathy, CHF, COPD, and stroke presents the ED today via EMS for shortness of breath.  Patient reports that he has been here multiple times in the past week for the same complaints.  Was feeling better after being discharged 2 days ago but states that he started feeling short of breath again last night has persisted ever since.  Had associated tightness in the chest.  No fevers, cough, nausea, vomiting.  No swelling in the legs or abdomen.  He received 2 DuoNeb treatments and steroids by EMS en route to the ED.  States that helped somewhat.  No additional complaints or concerns at this time.    Home Medications Prior to Admission medications   Medication Sig Start Date End Date Taking? Authorizing Provider  albuterol (PROVENTIL) (2.5 MG/3ML) 0.083% nebulizer solution Use 1 vial (2.5 mg total) by nebulization every 6 (six) hours as needed for wheezing or shortness of breath. 06/16/23   Atway, Rayann N, DO  albuterol (VENTOLIN HFA) 108 (90 Base) MCG/ACT inhaler Inhale 2 puffs into the lungs every 4 (four) hours as needed for wheezing or shortness of breath. 06/10/23   Nooruddin, Jason Fila, MD  albuterol (VENTOLIN HFA) 108 (90 Base) MCG/ACT inhaler Inhale 2 puffs into the lungs every 4 (four) hours as needed for wheezing or shortness of breath. 06/10/23   Nooruddin, Jason Fila, MD  amLODipine (NORVASC) 5 MG tablet Take 1 tablet (5 mg total) by mouth daily. 03/24/23   Nooruddin, Jason Fila, MD  aspirin EC 81 MG tablet Take 1 tablet (81 mg total) by mouth daily. Swallow whole. 02/15/23   Peterson Ao, MD  atorvastatin (LIPITOR) 40 MG tablet Take 1 tablet (40 mg total) by mouth daily. 07/06/23   [name], [name], DO  Budesonide (PULMICORT FLEXHALER) 90 MCG/ACT  inhaler Inhale 1 puff into the lungs 2 (two) times daily. 02/17/23   Nooruddin, Jason Fila, MD  budesonide-formoterol [facility]) 160-4.5 MCG/ACT inhaler Inhale 2 puffs into the lungs in the morning and at bedtime. 04/29/23   [name], [name], DO  lidocaine (LIDODERM) 5 % Place 2 patches onto the skin daily. Remove & Discard patch within 12 hours or as directed by MD Patient not taking: Reported on 02/14/2023 02/11/23   Peterson Ao, MD  lisinopril (ZESTRIL) 40 MG tablet Take 1/2 tablet (20 mg total) by mouth daily. 06/08/23   Kathleen Lime, MD  lovastatin (MEVACOR) 20 MG tablet Take 1 tablet (20 mg total) by mouth daily. 06/21/23   Nooruddin, Jason Fila, MD  methylPREDNISolone (MEDROL DOSEPAK) 4 MG TBPK tablet Take 6 tablets (60 mg total) by mouth on day 1, then 5 tablets (50 mg total) on day 2, then 4 tablets (40 mg total) on day 3, then 3 tablets (30 mg total) on day 4, then 2 tablets (20 mg total) on day 5, and then 1 tablet (10 mg total) on day 6. 06/24/23   Arthor Captain, PA-C  metoprolol tartrate (LOPRESSOR) 25 MG tablet Take 1 tablet (25 mg total) by mouth 2 (two) times daily. 06/08/23 09/06/23  Kathleen Lime, MD  nicotine (NICODERM CQ - DOSED IN MG/24 HOURS) 14 mg/24hr patch Place 1 patch (14 mg total) onto the skin daily. Patient not taking: Reported on 02/14/2023 06/24/21  Adron Bene, MD  nitroGLYCERIN (NITROSTAT) 0.4 MG SL tablet Place 1 tablet (0.4 mg total) under the tongue every 5 (five) minutes x 3 doses as needed for chest pain. 10/14/22   Nooruddin, Jason Fila, MD  predniSONE (DELTASONE) 10 MG tablet Take 4 tablets (40 mg total) by mouth daily. 07/17/23   Wynetta Fines, MD      Allergies    Ivp dye [iodinated contrast media], Tramadol, and Doxycycline    Review of Systems   Review of Systems  Respiratory:  Positive for shortness of breath.   All other systems reviewed and are negative.   Physical Exam Updated Vital Signs BP (!) 144/87 (BP Location: Right Arm)   Pulse (!) 102   Temp  98.7 F (37.1 C) (Oral)   Resp 16   Ht 6' (1.829 m)   Wt 79.4 kg   SpO2 95%   BMI 23.73 kg/m  Physical Exam Vitals and nursing note reviewed.  Constitutional:      Appearance: Normal appearance.  HENT:     Head: Normocephalic and atraumatic.     Mouth/Throat:     Mouth: Mucous membranes are moist.  Eyes:     Conjunctiva/sclera: Conjunctivae normal.     Pupils: Pupils are equal, round, and reactive to light.  Cardiovascular:     Rate and Rhythm: Normal rate and regular rhythm.     Pulses: Normal pulses.     Heart sounds: Normal heart sounds.  Pulmonary:     Effort: Pulmonary effort is normal.     Breath sounds: Wheezing present.     Comments: Short of breath every couple words when speaking Abdominal:     Palpations: Abdomen is soft.     Tenderness: There is no abdominal tenderness.  Musculoskeletal:        General: Normal range of motion.     Cervical back: Normal range of motion.     Right lower leg: No edema.     Left lower leg: No edema.  Skin:    General: Skin is warm and dry.     Findings: No rash.  Neurological:     General: No focal deficit present.     Mental Status: He is alert.  Psychiatric:        Mood and Affect: Mood normal.        Behavior: Behavior normal.    ED Results / Procedures / Treatments   Labs (all labs ordered are listed, but only abnormal results are displayed) Labs Reviewed  BASIC METABOLIC PANEL WITH GFR - Abnormal; Notable for the following components:      Result Value   Potassium 3.3 (*)    Glucose, Bld 118 (*)    BUN 6 (*)    All other components within normal limits  CBC WITH DIFFERENTIAL/PLATELET - Abnormal; Notable for the following components:   Monocytes Absolute 1.3 (*)    All other components within normal limits  RESP PANEL BY RT-PCR (RSV, FLU A&B, COVID)  RVPGX2  BRAIN NATRIURETIC PEPTIDE  D-DIMER, QUANTITATIVE (NOT AT [facility])  TROPONIN I (HIGH SENSITIVITY)    EKG None  Radiology DG Chest 2 View Result Date:  07/20/2023 CLINICAL DATA:  Shortness of breath. EXAM: CHEST - 2 VIEW COMPARISON:  Chest radiograph dated 07/17/2023. FINDINGS: Stable cardiomediastinal silhouette. Hyperinflation. No focal consolidation, pleural effusion, or pneumothorax. Similar slight compression deformities of the midthoracic spine. No acute osseous abnormality. IMPRESSION: Hyperinflation.  No acute cardiopulmonary findings. Electronically Signed   By: Hart Robinsons  M.D.   On: 07/20/2023 14:58    Procedures Procedures    Medications Ordered in ED Medications  magnesium sulfate IVPB 2 g 50 mL (0 g Intravenous Stopped 07/20/23 1423)    ED Course/ Medical Decision Making/ A&P                                 Medical Decision Making Amount and/or Complexity of Data Reviewed Labs: ordered. Radiology: ordered.  Risk Prescription drug management.   This patient presents to the ED for concern of shortness of breath, this involves an extensive number of treatment options, and is a complaint that carries with it a high risk of complications and morbidity.   Differential diagnosis includes: ACS, PE, pneumonia, pneumothorax, pleural effusion, COPD exacerbation, etc.   Comorbidities  See HPI above   Additional History  Additional history obtained from previous ED records   Cardiac Monitoring / EKG  The patient was maintained on a cardiac monitor.  I personally viewed and interpreted the cardiac monitored which showed: sinus tachycardia with a heart rate of 101 bpm.   Lab Tests  I ordered and personally interpreted labs.  The pertinent results include:   BMP and CBC are reassuring - no acute electrolyte derangement, AKI, infection, or anemia Negative troponin  Negative respiratory panel D-dimer and BNP pending at shift change.   Imaging Studies  I ordered imaging studies including CXR  I independently visualized and interpreted imaging which showed:  Hyperinflation.  No acute cardiopulmonary findings. I  agree with the radiologist interpretation   Problem List / ED Course / Critical Interventions / Medication Management  Patient reports intermittent shortness of breath for the past several weeks.  Worsened over the past 2 days.  Shortness of breath is worse with ambulation.  Was seen 2 days ago and sent home after improvement of symptoms with magnesium. With the same complaint.  Tried inhalers and nebulizers at home on improvement.  Oxygen with 9% on room air with EMS.  Had 2 DuoNeb treatments and Solu-Medrol without improvement. I ordered medications including: Magnesium for shortness of breath  Reevaluation of the patient after these medicines showed that the patient improved.  Oxygen saturation of 93% on room air. I have reviewed the patients home medicines and have made adjustments as needed   Social Determinants of Health  Tobacco use   Test / Admission - Considered  Patient care taken over by Fayrene Helper PA-C at shift change. Disposition pending results.       Final Clinical Impression(s) / ED Diagnoses Final diagnoses:  None    Rx / DC Orders ED Discharge Orders     None         Maxwell Marion, PA-C 07/20/23 1604    Gloris Manchester, MD 07/25/23 2330

## 2023-07-20 NOTE — Telephone Encounter (Signed)
  Chief Complaint: Patient states she is having worse COPR breathing- audible wheezing Symptoms: SOB, wheezing Frequency: ongoing- patient states he is worse- has had frequent trips to ED for breathing  Disposition: [x] ED /[] Urgent Care (no appt availability in office) / [] Appointment(In office/virtual)/ []  [facility]/ [] Home Care/ [] Refused Recommended Disposition /[] [facility]/ []  Follow-up with PCP Additional Notes: Patient is insistent on scheduling follow up with his PCP- I have scheduled hospital follow up- ahd now he will call EMS to take him to ED.    Copied from CRM [id number]. Topic: Clinical - Red Word Triage >> Jul 20, 2023  9:19 AM Hamdi H wrote: [facility] that prompted transfer to Nurse Triage: COPD flare up Reason for Disposition  [1] MODERATE difficulty breathing (e.g., speaks in phrases, SOB even at rest, pulse 100-120) AND [2] NEW-onset or WORSE than normal  Answer Assessment - Initial Assessment Questions 1. RESPIRATORY STATUS: "Describe your breathing?" (e.g., wheezing, shortness of breath, unable to speak, severe coughing)      COPD- breathing flare- patient states he is calling EMS 2. ONSET: "When did this breathing problem begin?"      Ongoing- worse today 3. PATTERN "Does the difficult breathing come and go, or has it been constant since it started?"      Constant- patient is audibly wheezing 4. SEVERITY: "How bad is your breathing?" (e.g., mild, moderate, severe)    - MILD: No SOB at rest, mild SOB with walking, speaks normally in sentences, can lie down, no retractions, pulse < 100.    - MODERATE: SOB at rest, SOB with minimal exertion and prefers to sit, cannot lie down flat, speaks in phrases, mild retractions, audible wheezing, pulse 100-120.    - SEVERE: Very SOB at rest, speaks in single words, struggling to breathe, sitting hunched forward, retractions, pulse > 120      Moderate- patient is speaking in sentances- but loud wheezing 5.  RECURRENT SYMPTOM: "Have you had difficulty breathing before?" If Yes, ask: "When was the last time?" and "What happened that time?"      yes  7. LUNG HISTORY: "Do you have any history of lung disease?"  (e.g., pulmonary embolus, asthma, emphysema)     COPD 8. CAUSE: "What do you think is causing the breathing problem?"      COPD  Protocols used: Breathing Difficulty-A-AH

## 2023-07-20 NOTE — ED Triage Notes (Signed)
 Patient coming from home for SOB. Patient found by EMS to be 89% RA. Patient received 2 duoneb and 125solumedrol by EMS. Hx of COPD and CHF

## 2023-07-28 ENCOUNTER — Other Ambulatory Visit (HOSPITAL_COMMUNITY): Payer: Self-pay

## 2023-07-28 ENCOUNTER — Ambulatory Visit: Admitting: Student

## 2023-07-28 VITALS — HR 105 | Temp 97.8°F | Ht 72.0 in | Wt 169.0 lb

## 2023-07-28 DIAGNOSIS — J302 Other seasonal allergic rhinitis: Secondary | ICD-10-CM | POA: Diagnosis not present

## 2023-07-28 DIAGNOSIS — I10 Essential (primary) hypertension: Secondary | ICD-10-CM

## 2023-07-28 DIAGNOSIS — J449 Chronic obstructive pulmonary disease, unspecified: Secondary | ICD-10-CM

## 2023-07-28 DIAGNOSIS — J441 Chronic obstructive pulmonary disease with (acute) exacerbation: Secondary | ICD-10-CM

## 2023-07-28 DIAGNOSIS — F17210 Nicotine dependence, cigarettes, uncomplicated: Secondary | ICD-10-CM

## 2023-07-28 DIAGNOSIS — Z720 Tobacco use: Secondary | ICD-10-CM

## 2023-07-28 MED ORDER — TRELEGY ELLIPTA 100-62.5-25 MCG/ACT IN AEPB
1.0000 | INHALATION_SPRAY | Freq: Every day | RESPIRATORY_TRACT | 2 refills | Status: DC
  Filled 2023-07-28 – 2023-08-03 (×3): qty 60, 30d supply, fill #0
  Filled 2023-08-26: qty 60, 30d supply, fill #1
  Filled 2023-09-20: qty 60, 30d supply, fill #2

## 2023-07-28 MED ORDER — NICOTINE 14 MG/24HR TD PT24
14.0000 mg | MEDICATED_PATCH | TRANSDERMAL | 1 refills | Status: DC
Start: 2023-07-28 — End: 2023-10-07
  Filled 2023-07-28 – 2023-08-03 (×2): qty 28, 28d supply, fill #0
  Filled 2023-09-13: qty 28, 28d supply, fill #1

## 2023-07-28 MED ORDER — FLUTICASONE PROPIONATE 50 MCG/ACT NA SUSP
1.0000 | Freq: Every day | NASAL | 0 refills | Status: DC
  Filled 2023-07-28 – 2023-08-03 (×2): qty 16, 60d supply, fill #0

## 2023-07-28 MED ORDER — LORATADINE 10 MG PO TABS
10.0000 mg | ORAL_TABLET | Freq: Every day | ORAL | 0 refills | Status: DC
  Filled 2023-07-28 – 2023-08-03 (×2): qty 100, 100d supply, fill #0

## 2023-07-28 NOTE — Progress Notes (Unsigned)
 Established Patient Office Visit  Subjective   Patient ID: Francisco [name], male    DOB: April 20, 1960  Age: 64 y.o. MRN: [id number]  No chief complaint on file.   HPI  This is a 63 year old male living with a history stated below and presents today for ED follow up on COPD exacerbation. Please see problem based assessment and plan for additional details.   PMHX: history of cardiomyopathy, CHF, COPD, and stroke   Past Medical History:  Diagnosis Date   CHF (congestive heart failure) (HCC)    COPD (chronic obstructive pulmonary disease) (HCC)    COPD with acute exacerbation (HCC) 10/21/2021   Coronary artery disease    a. s/p prior PCI.   Hepatitis-C    Hypertension    IV drug abuse (HCC)    a. 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   Medically noncompliant    MI (myocardial infarction) (HCC)    Tobacco abuse     ROS   As per assessment and  Plan  Objective:     There were no vitals taken for this visit. BP Readings from Last 3 Encounters:  07/20/23 130/89  07/17/23 (!) 157/92  07/14/23 (!) 140/93   Wt Readings from Last 3 Encounters:  07/28/23 169 lb (76.7 kg)  07/20/23 175 lb (79.4 kg)  07/17/23 175 lb (79.4 kg)   SpO2 Readings from Last 3 Encounters:  07/28/23 93%  07/20/23 94%  07/17/23 94%      Physical Exam   No results found for any visits on 07/28/23.  Last CBC Lab Results  Component Value Date   WBC 9.0 07/20/2023   HGB 15.0 07/20/2023   HCT 45.5 07/20/2023   MCV 93.4 07/20/2023   MCH 30.8 07/20/2023   RDW 13.5 07/20/2023   PLT 282 07/20/2023   Last metabolic panel Lab Results  Component Value Date   GLUCOSE 118 (H) 07/20/2023   NA 137 07/20/2023   K 3.3 (L) 07/20/2023   CL 101 07/20/2023   CO2 27 07/20/2023   BUN 6 (L) 07/20/2023   CREATININE 0.92 07/20/2023   GFRNONAA >60 07/20/2023   CALCIUM 9.0 07/20/2023   PHOS 3.7 02/07/2023   PROT 6.1 (L) 06/24/2023   ALBUMIN 3.5 06/24/2023   BILITOT 0.6 06/24/2023   ALKPHOS 63  06/24/2023   AST 28 06/24/2023   ALT 59 (H) 06/24/2023   ANIONGAP 9 07/20/2023   Last lipids Lab Results  Component Value Date   CHOL 134 02/13/2023   HDL 49 02/13/2023   LDLCALC 71 02/13/2023   TRIG 70 02/13/2023   CHOLHDL 2.7 02/13/2023   Last hemoglobin A1c Lab Results  Component Value Date   HGBA1C 6.2 (H) 02/13/2023      The ASCVD Risk score (Arnett DK, et al., 2019) failed to calculate for the following reasons:   Risk score cannot be calculated because patient has a medical history suggesting prior/existing ASCVD    Assessment & Plan:  Suspected COPD exacerbation - No formal PFTs  - Multiple ED visits 07/20/2023, March  - Was suppose to move [employer] [facility]  - Reports Dyspnea and productive cough with clear sputum about three weeks, improves 2-3 days during ED,  - OP Albuterol inhaler, Symbicort BID [ICS + LABA]  - Trelegy inhaler  - SOB not improved  - Did not pick up Medrol dose pack - pick that up  - Z pack 06/01/2023  - Per exam, poor inspiratory effort with prolonged expiratory wheeze - CXR 07/20/2023  Hyperinflation of  lungs  - Spiriva (tiotropium)   - PrevNAR - Tdap - Flu vaccine  - LUNG Scan for smoekr   Seasonal allergies - traffic control - eyes itching, runny nose for the past several weeks   - Flonase  - Loratadine   Tobacco USE - States that previously he had taken Chantix however it affected his mood = bad dreams  - Hiccups with gum  - Continue smoking cessation - Smokes half a pack   HTN BP Readings from Last 3 Encounters:  07/20/23 130/89  07/17/23 (!) 157/92  07/14/23 (!) 140/93   - OP medication regimen Continue Lopressor 25 mg twice daily and 20 mg of lisinopril   HFpEF EF 55 to 60%, Grade II diastolic dysfunction ; 02/07/2023 BNP 07/20/2023 63.9 - WNL    Problem List Items Addressed This Visit   None   No follow-ups on file.    Jeral Pinch, DO

## 2023-07-28 NOTE — Patient Instructions (Addendum)
 Thank you, Francisco [name] for allowing [place] to provide your care today. Today we discussed:  For your lungs - START Trelegy inhaler - Pick up the Medrol dose pack from the pharmacy  - I placed an order for pulmonary function tests, you will get a call to get it done  For your allergies - Use Flonase- spray it in each nose  - Take Loratadine, one tablet daily for 30 days   For your smoking - Apply the patch on to your skin daily   I have ordered the following labs for you:  Lab Orders  No laboratory test(s) ordered today     Tests ordered today:  PFTs   Referrals ordered today:   Referral Orders  No referral(s) requested today     I have ordered the following medication/changed the following medications:   Stop the following medications: Medications Discontinued During This Encounter  Medication Reason   Budesonide (PULMICORT FLEXHALER) 90 MCG/ACT inhaler    budesonide-formoterol (SYMBICORT) 160-4.5 MCG/ACT inhaler    nicotine (NICODERM CQ - DOSED IN MG/24 HOURS) 14 mg/24hr patch Reorder     Start the following medications: Meds ordered this encounter  Medications   nicotine (NICODERM CQ - DOSED IN MG/24 HOURS) 14 mg/24hr patch    Sig: Place 1 patch (14 mg total) onto the skin daily.    Dispense:  30 patch    Refill:  1    IM program   fluticasone (FLONASE) 50 MCG/ACT nasal spray    Sig: Place 1 spray into both nostrils daily.    Dispense:  1 g    Refill:  0   loratadine (CLARITIN) 10 MG tablet    Sig: Take 1 tablet (10 mg total) by mouth daily.    Dispense:  90 tablet    Refill:  0   Fluticasone-Umeclidin-Vilant (TRELEGY ELLIPTA) 100-62.5-25 MCG/ACT AEPB    Sig: Inhale 2 puffs into the lungs 2 (two) times daily.    Dispense:  28 each    Refill:  2     Follow up:  1 month for COPD      Remember:   Should you have any questions or concerns please call the internal medicine clinic at [phone].     Jeral Pinch, DO [facility]

## 2023-07-29 ENCOUNTER — Telehealth: Payer: Self-pay

## 2023-07-29 DIAGNOSIS — J302 Other seasonal allergic rhinitis: Secondary | ICD-10-CM | POA: Insufficient documentation

## 2023-07-29 NOTE — Telephone Encounter (Signed)
 Prior Authorization for patient (Trelegy Ellipta 100-62.5-25MCG/ACT aerosol powder) came through on cover my meds was submitted with last office notes awaiting approval or denial.  KEY:[id number]

## 2023-07-29 NOTE — Assessment & Plan Note (Signed)
 BP Readings from Last 3 Encounters:  07/20/23 130/89  07/17/23 (!) 157/92  07/14/23 (!) 140/93   OP medication regimen Lopressor 25 mg twice daily and 20 mg of lisinopril. BP in clinic is stable. - Continue Lopressor 25 mg twice daily and 20 mg of lisinopril.

## 2023-07-29 NOTE — Assessment & Plan Note (Signed)
>>  ASSESSMENT AND PLAN FOR TOBACCO ABUSE WRITTEN ON 07/29/2023  7:55 AM BY [name], [name], DO  Patient reports that he continues to smoke half a pack a day, previously he tried Chantix which gave him bad dreams and nightmares.  The nicotine  gum up gave him hiccups.  Reports that he wants to try smoking cessation, is willing to try the patch.  He is aware that his smoking does make his underlying conditions and the COPD flare worse.  -Nicotine  patch 24-hour -Smoking cessation counseling provided

## 2023-07-29 NOTE — Assessment & Plan Note (Signed)
 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  For the past several weeks, he reports symptoms of itching of eyes, runny nose.  Reports that when he works outside he can see a blanket of pollen that makes his symptoms worse.  -Ordered Flonase -Loratadine 10 mg 1 tablet by mouth daily

## 2023-07-29 NOTE — Assessment & Plan Note (Signed)
>>  ASSESSMENT AND PLAN FOR COPD (CHRONIC OBSTRUCTIVE PULMONARY DISEASE) (HCC) WRITTEN ON 07/29/2023  7:53 AM BY [name], [name], DO  No formal PFTs per chart review. Has had multiple ED visits past couple of months for COPD flare up, most recent 07/20/2023. He was supposed to move to [facility] in Jan, however had change of plans.  Patient reports dyspnea and productive cough with clear sputum for the past 3 weeks, which improves momentarily for 2 to 3 days after he is seen in the ED; outpatient medication regimen includes albuterol  inhaler and Symbicort  (ICS plus LABA); patient reports that his shortness of breath has not improved.  Patient had a chest x-ray on 07/20/2023 that showed hyperinflated lungs.  Per exam, he has poor inspiratory effort with prolonged expiratory wheeze.  He does not have any formal PFTs, we discussed about having a proper diagnosis of COPD.  His current regimen does not cover COPD, he needs a LAMA.  -Ordered PFTs -Stop Symbicort , start Trelegy inhaler 2 times a day -At the next office visit, patient is willing to receive pneumococcal vaccine, Tdap, flu vaccine. -At the next office visit, please discuss about screening CT lung exam for smokers

## 2023-07-29 NOTE — Assessment & Plan Note (Signed)
 No formal PFTs per chart review. Has had multiple ED visits past couple of months for COPD flare up, most recent 07/20/2023. He was supposed to move to [facility] in Jan, however had change of plans.  Patient reports dyspnea and productive cough with clear sputum for the past 3 weeks, which improves momentarily for 2 to 3 days after he is seen in the ED; outpatient medication regimen includes albuterol inhaler and Symbicort (ICS plus LABA); patient reports that his shortness of breath has not improved.  Patient had a chest x-ray on 07/20/2023 that showed hyperinflated lungs.  Per exam, he has poor inspiratory effort with prolonged expiratory wheeze.  He does not have any formal PFTs, we discussed about having a proper diagnosis of COPD.  His current regimen does not cover COPD, he needs a LAMA.  -Ordered PFTs -Stop Symbicort, start Trelegy inhaler 2 times a day -At the next office visit, patient is willing to receive pneumococcal vaccine, Tdap, flu vaccine. -At the next office visit, please discuss about screening CT lung exam for smokers

## 2023-07-29 NOTE — Assessment & Plan Note (Signed)
 Patient reports that he continues to smoke half a pack a day, previously he tried Chantix which gave him bad dreams and nightmares.  The nicotine gum up gave him hiccups.  Reports that he wants to try smoking cessation, is willing to try the patch.  He is aware that his smoking does make his underlying conditions and the COPD flare worse.  -Nicotine patch 24-hour -Smoking cessation counseling provided

## 2023-07-29 NOTE — Telephone Encounter (Signed)
 [name] (KeyLeandro Reasoner) PA Case ID #: [id number] Rx #: [id number] Need Help? Call us at [phone] Outcome Approved today by [facility] Medicaid 2017 Approved. This drug has been approved. Approved quantity: 60 units per 30 day(s). The drug has been approved from 07/15/2023 to 07/28/2024. Please call the pharmacy to process your prescription claim. Generic or biosimilar substitution may be required when available and preferred on the formulary. Effective Date: 07/15/2023 Authorization Expiration Date: 07/28/2024 Drug Trelegy Ellipta 100-62.5-25MCG/ACT aerosol powder ePA cloud logo Form [facility] Medicaid of [employer] Electronic Prior Authorization Request Form [phone] NCP[place])

## 2023-07-29 NOTE — Progress Notes (Signed)
 Internal Medicine Clinic Attending  Case discussed with the resident at the time of the visit.  We reviewed the resident's history and exam and pertinent patient test results.  I agree with the assessment, diagnosis, and plan of care documented in the resident's note.

## 2023-07-30 DIAGNOSIS — Z419 Encounter for procedure for purposes other than remedying health state, unspecified: Secondary | ICD-10-CM | POA: Diagnosis not present

## 2023-08-01 ENCOUNTER — Other Ambulatory Visit: Payer: Self-pay

## 2023-08-02 ENCOUNTER — Other Ambulatory Visit (HOSPITAL_COMMUNITY): Payer: Self-pay

## 2023-08-02 DIAGNOSIS — F411 Generalized anxiety disorder: Secondary | ICD-10-CM | POA: Diagnosis not present

## 2023-08-03 ENCOUNTER — Other Ambulatory Visit (HOSPITAL_COMMUNITY): Payer: Self-pay

## 2023-08-04 NOTE — Telephone Encounter (Signed)
 Spoke with the pt.  Appt given with instructions for the following:   Name: Francisco [name], Francisco [name] MRN: [id number]  Date: 08/15/2023 Status: Sch  Time: 10:00 AM Length: 60  Visit Type: B/A FULL PFT [1040] Copay: $0.00  Provider: Aniceto Kern Department: [facility]  Referring Provider: NARENDRA, NISCHAL       No Caffeine 4 hrs before testing 2. No inhaler/nebulizer treatments 4 hrs before testing.  3 No smoking the day of Testing  PFT's to be perform @ [facility]    Copied from CRM [id number]. Topic: Appointments - Scheduling Inquiry for Clinic >> Aug 04, 2023 10:55 AM [name] wrote: Reason for CRM: Patient is calling about getting scheduled for his pulmonary function test. His phone was cut off and it is now back on.

## 2023-08-05 DIAGNOSIS — F411 Generalized anxiety disorder: Secondary | ICD-10-CM | POA: Diagnosis not present

## 2023-08-09 ENCOUNTER — Emergency Department (HOSPITAL_COMMUNITY)

## 2023-08-09 ENCOUNTER — Other Ambulatory Visit: Payer: Self-pay | Admitting: Student

## 2023-08-09 ENCOUNTER — Telehealth: Payer: Self-pay | Admitting: *Deleted

## 2023-08-09 ENCOUNTER — Other Ambulatory Visit: Payer: Self-pay

## 2023-08-09 ENCOUNTER — Inpatient Hospital Stay (HOSPITAL_COMMUNITY)
Admission: EM | Admit: 2023-08-09 | Discharge: 2023-08-13 | DRG: 208 | Discharge status: Against Medical Advice | Attending: Internal Medicine | Admitting: Internal Medicine

## 2023-08-09 ENCOUNTER — Other Ambulatory Visit (HOSPITAL_COMMUNITY): Payer: Self-pay

## 2023-08-09 DIAGNOSIS — Z79899 Other long term (current) drug therapy: Secondary | ICD-10-CM | POA: Diagnosis not present

## 2023-08-09 DIAGNOSIS — J14 Pneumonia due to Hemophilus influenzae: Principal | ICD-10-CM | POA: Diagnosis present

## 2023-08-09 DIAGNOSIS — I160 Hypertensive urgency: Secondary | ICD-10-CM | POA: Diagnosis present

## 2023-08-09 DIAGNOSIS — J9612 Chronic respiratory failure with hypercapnia: Secondary | ICD-10-CM | POA: Diagnosis not present

## 2023-08-09 DIAGNOSIS — E785 Hyperlipidemia, unspecified: Secondary | ICD-10-CM | POA: Diagnosis present

## 2023-08-09 DIAGNOSIS — I5042 Chronic combined systolic (congestive) and diastolic (congestive) heart failure: Secondary | ICD-10-CM | POA: Diagnosis present

## 2023-08-09 DIAGNOSIS — I10 Essential (primary) hypertension: Secondary | ICD-10-CM | POA: Diagnosis present

## 2023-08-09 DIAGNOSIS — I2510 Atherosclerotic heart disease of native coronary artery without angina pectoris: Secondary | ICD-10-CM | POA: Diagnosis present

## 2023-08-09 DIAGNOSIS — F17210 Nicotine dependence, cigarettes, uncomplicated: Secondary | ICD-10-CM | POA: Diagnosis present

## 2023-08-09 DIAGNOSIS — F411 Generalized anxiety disorder: Secondary | ICD-10-CM | POA: Diagnosis not present

## 2023-08-09 DIAGNOSIS — I480 Paroxysmal atrial fibrillation: Secondary | ICD-10-CM | POA: Diagnosis present

## 2023-08-09 DIAGNOSIS — J9601 Acute respiratory failure with hypoxia: Secondary | ICD-10-CM | POA: Diagnosis not present

## 2023-08-09 DIAGNOSIS — Z881 Allergy status to other antibiotic agents status: Secondary | ICD-10-CM

## 2023-08-09 DIAGNOSIS — Z720 Tobacco use: Secondary | ICD-10-CM | POA: Diagnosis not present

## 2023-08-09 DIAGNOSIS — J9622 Acute and chronic respiratory failure with hypercapnia: Secondary | ICD-10-CM | POA: Diagnosis present

## 2023-08-09 DIAGNOSIS — J9602 Acute respiratory failure with hypercapnia: Secondary | ICD-10-CM | POA: Diagnosis not present

## 2023-08-09 DIAGNOSIS — R7303 Prediabetes: Secondary | ICD-10-CM | POA: Diagnosis present

## 2023-08-09 DIAGNOSIS — E440 Moderate protein-calorie malnutrition: Secondary | ICD-10-CM | POA: Insufficient documentation

## 2023-08-09 DIAGNOSIS — Z1152 Encounter for screening for COVID-19: Secondary | ICD-10-CM | POA: Diagnosis not present

## 2023-08-09 DIAGNOSIS — I110 Hypertensive heart disease with heart failure: Secondary | ICD-10-CM | POA: Diagnosis present

## 2023-08-09 DIAGNOSIS — Z885 Allergy status to narcotic agent status: Secondary | ICD-10-CM

## 2023-08-09 DIAGNOSIS — Z8673 Personal history of transient ischemic attack (TIA), and cerebral infarction without residual deficits: Secondary | ICD-10-CM | POA: Diagnosis not present

## 2023-08-09 DIAGNOSIS — Z7982 Long term (current) use of aspirin: Secondary | ICD-10-CM

## 2023-08-09 DIAGNOSIS — Z8249 Family history of ischemic heart disease and other diseases of the circulatory system: Secondary | ICD-10-CM

## 2023-08-09 DIAGNOSIS — J189 Pneumonia, unspecified organism: Secondary | ICD-10-CM | POA: Diagnosis not present

## 2023-08-09 DIAGNOSIS — E876 Hypokalemia: Secondary | ICD-10-CM | POA: Diagnosis present

## 2023-08-09 DIAGNOSIS — I959 Hypotension, unspecified: Secondary | ICD-10-CM | POA: Diagnosis not present

## 2023-08-09 DIAGNOSIS — R569 Unspecified convulsions: Secondary | ICD-10-CM | POA: Diagnosis not present

## 2023-08-09 DIAGNOSIS — E1165 Type 2 diabetes mellitus with hyperglycemia: Secondary | ICD-10-CM | POA: Diagnosis present

## 2023-08-09 DIAGNOSIS — Z91041 Radiographic dye allergy status: Secondary | ICD-10-CM | POA: Diagnosis not present

## 2023-08-09 DIAGNOSIS — F1510 Other stimulant abuse, uncomplicated: Secondary | ICD-10-CM | POA: Diagnosis present

## 2023-08-09 DIAGNOSIS — Z5329 Procedure and treatment not carried out because of patient's decision for other reasons: Secondary | ICD-10-CM | POA: Diagnosis not present

## 2023-08-09 DIAGNOSIS — I252 Old myocardial infarction: Secondary | ICD-10-CM | POA: Diagnosis not present

## 2023-08-09 DIAGNOSIS — Z7952 Long term (current) use of systemic steroids: Secondary | ICD-10-CM

## 2023-08-09 DIAGNOSIS — Z6822 Body mass index (BMI) 22.0-22.9, adult: Secondary | ICD-10-CM

## 2023-08-09 DIAGNOSIS — R739 Hyperglycemia, unspecified: Secondary | ICD-10-CM | POA: Diagnosis not present

## 2023-08-09 DIAGNOSIS — F419 Anxiety disorder, unspecified: Secondary | ICD-10-CM | POA: Diagnosis present

## 2023-08-09 DIAGNOSIS — Z9861 Coronary angioplasty status: Secondary | ICD-10-CM | POA: Diagnosis not present

## 2023-08-09 DIAGNOSIS — J441 Chronic obstructive pulmonary disease with (acute) exacerbation: Secondary | ICD-10-CM | POA: Diagnosis not present

## 2023-08-09 DIAGNOSIS — J9621 Acute and chronic respiratory failure with hypoxia: Secondary | ICD-10-CM | POA: Diagnosis present

## 2023-08-09 DIAGNOSIS — I4891 Unspecified atrial fibrillation: Secondary | ICD-10-CM | POA: Diagnosis present

## 2023-08-09 LAB — CBC WITH DIFFERENTIAL/PLATELET
Abs Immature Granulocytes: 0.11 10*3/uL — ABNORMAL HIGH (ref 0.00–0.07)
Basophils Absolute: 0.1 10*3/uL (ref 0.0–0.1)
Basophils Relative: 1 %
Eosinophils Absolute: 0 10*3/uL (ref 0.0–0.5)
Eosinophils Relative: 0 %
HCT: 48.6 % (ref 39.0–52.0)
Hemoglobin: 15.8 g/dL (ref 13.0–17.0)
Immature Granulocytes: 1 %
Lymphocytes Relative: 24 %
Lymphs Abs: 4.3 10*3/uL — ABNORMAL HIGH (ref 0.7–4.0)
MCH: 31.1 pg (ref 26.0–34.0)
MCHC: 32.5 g/dL (ref 30.0–36.0)
MCV: 95.7 fL (ref 80.0–100.0)
Monocytes Absolute: 2.4 10*3/uL — ABNORMAL HIGH (ref 0.1–1.0)
Monocytes Relative: 13 %
Neutro Abs: 11.4 10*3/uL — ABNORMAL HIGH (ref 1.7–7.7)
Neutrophils Relative %: 61 %
Platelets: 406 10*3/uL — ABNORMAL HIGH (ref 150–400)
RBC: 5.08 MIL/uL (ref 4.22–5.81)
RDW: 13.9 % (ref 11.5–15.5)
WBC: 18.4 10*3/uL — ABNORMAL HIGH (ref 4.0–10.5)
nRBC: 0 % (ref 0.0–0.2)

## 2023-08-09 LAB — BASIC METABOLIC PANEL WITH GFR
Anion gap: 11 (ref 5–15)
BUN: 9 mg/dL (ref 8–23)
CO2: 28 mmol/L (ref 22–32)
Calcium: 9.3 mg/dL (ref 8.9–10.3)
Chloride: 105 mmol/L (ref 98–111)
Creatinine, Ser: 0.94 mg/dL (ref 0.61–1.24)
GFR, Estimated: >60 mL/min (ref >60)
Glucose, Bld: 104 mg/dL — ABNORMAL HIGH (ref 70–99)
Potassium: 3.7 mmol/L (ref 3.5–5.1)
Sodium: 144 mmol/L (ref 135–145)

## 2023-08-09 LAB — I-STAT ARTERIAL BLOOD GAS, ED
Acid-Base Excess: 2 mmol/L (ref 0.0–2.0)
Bicarbonate: 31 mmol/L — ABNORMAL HIGH (ref 20.0–28.0)
Calcium, Ion: 1.17 mmol/L (ref 1.15–1.40)
HCT: 40 % (ref 39.0–52.0)
Hemoglobin: 13.6 g/dL (ref 13.0–17.0)
O2 Saturation: 100 %
Patient temperature: 99.5
Potassium: 3.4 mmol/L — ABNORMAL LOW (ref 3.5–5.1)
Sodium: 142 mmol/L (ref 135–145)
TCO2: 33 mmol/L — ABNORMAL HIGH (ref 22–32)
pCO2 arterial: 67.5 mmHg (ref 32–48)
pH, Arterial: 7.272 — ABNORMAL LOW (ref 7.35–7.45)
pO2, Arterial: 586 mmHg — ABNORMAL HIGH (ref 83–108)

## 2023-08-09 LAB — RAPID URINE DRUG SCREEN, HOSP PERFORMED
Amphetamines: POSITIVE — AB
Barbiturates: NOT DETECTED
Benzodiazepines: NOT DETECTED
Cocaine: NOT DETECTED
Opiates: NOT DETECTED
Tetrahydrocannabinol: NOT DETECTED

## 2023-08-09 LAB — I-STAT CHEM 8, ED
BUN: 8 mg/dL (ref 8–23)
Calcium, Ion: 1.14 mmol/L — ABNORMAL LOW (ref 1.15–1.40)
Chloride: 103 mmol/L (ref 98–111)
Creatinine, Ser: 0.9 mg/dL (ref 0.61–1.24)
Glucose, Bld: 103 mg/dL — ABNORMAL HIGH (ref 70–99)
HCT: 49 % (ref 39.0–52.0)
Hemoglobin: 16.7 g/dL (ref 13.0–17.0)
Potassium: 3.5 mmol/L (ref 3.5–5.1)
Sodium: 143 mmol/L (ref 135–145)
TCO2: 31 mmol/L (ref 22–32)

## 2023-08-09 LAB — CREATININE, SERUM
Creatinine, Ser: 1.04 mg/dL (ref 0.61–1.24)
GFR, Estimated: >60 mL/min (ref >60)

## 2023-08-09 LAB — CBC
HCT: 40.2 % (ref 39.0–52.0)
Hemoglobin: 13.5 g/dL (ref 13.0–17.0)
MCH: 31.5 pg (ref 26.0–34.0)
MCHC: 33.6 g/dL (ref 30.0–36.0)
MCV: 93.7 fL (ref 80.0–100.0)
Platelets: 320 10*3/uL (ref 150–400)
RBC: 4.29 MIL/uL (ref 4.22–5.81)
RDW: 14.2 % (ref 11.5–15.5)
WBC: 12.8 10*3/uL — ABNORMAL HIGH (ref 4.0–10.5)
nRBC: 0 % (ref 0.0–0.2)

## 2023-08-09 LAB — I-STAT VENOUS BLOOD GAS, ED
Acid-Base Excess: 6 mmol/L — ABNORMAL HIGH (ref 0.0–2.0)
Bicarbonate: 34.3 mmol/L — ABNORMAL HIGH (ref 20.0–28.0)
Calcium, Ion: 1.12 mmol/L — ABNORMAL LOW (ref 1.15–1.40)
HCT: 47 % (ref 39.0–52.0)
Hemoglobin: 16 g/dL (ref 13.0–17.0)
O2 Saturation: 71 %
Potassium: 3.5 mmol/L (ref 3.5–5.1)
Sodium: 142 mmol/L (ref 135–145)
TCO2: 36 mmol/L — ABNORMAL HIGH (ref 22–32)
pCO2, Ven: 64.4 mmHg — ABNORMAL HIGH (ref 44–60)
pH, Ven: 7.334 (ref 7.25–7.43)
pO2, Ven: 41 mmHg (ref 32–45)

## 2023-08-09 LAB — URINALYSIS, ROUTINE W REFLEX MICROSCOPIC
Bilirubin Urine: NEGATIVE
Glucose, UA: NEGATIVE mg/dL
Ketones, ur: NEGATIVE mg/dL
Leukocytes,Ua: NEGATIVE
Nitrite: NEGATIVE
Protein, ur: >=300 mg/dL — AB
RBC / HPF: >50 RBC/hpf (ref 0–5)
Specific Gravity, Urine: 1.015 (ref 1.005–1.030)
pH: 6 (ref 5.0–8.0)

## 2023-08-09 LAB — RESP PANEL BY RT-PCR (RSV, FLU A&B, COVID)  RVPGX2
Influenza A by PCR: NEGATIVE
Influenza B by PCR: NEGATIVE
Resp Syncytial Virus by PCR: NEGATIVE
SARS Coronavirus 2 by RT PCR: NEGATIVE

## 2023-08-09 LAB — BRAIN NATRIURETIC PEPTIDE: B Natriuretic Peptide: 59.3 pg/mL (ref 0.0–100.0)

## 2023-08-09 LAB — GLUCOSE, CAPILLARY
Glucose-Capillary: 143 mg/dL — ABNORMAL HIGH (ref 70–99)
Glucose-Capillary: 151 mg/dL — ABNORMAL HIGH (ref 70–99)

## 2023-08-09 MED ORDER — LACTATED RINGERS IV SOLN: INTRAVENOUS | Status: DC

## 2023-08-09 MED ORDER — FENTANYL CITRATE PF 50 MCG/ML IJ SOSY
50.0000 ug | PREFILLED_SYRINGE | INTRAMUSCULAR | Status: DC | PRN
  Administered 2023-08-10 – 2023-08-11 (×5): 50 ug via INTRAVENOUS

## 2023-08-09 MED ORDER — ORAL CARE MOUTH RINSE: 15.0000 mL | OROMUCOSAL | Status: DC | PRN

## 2023-08-09 MED ORDER — SODIUM CHLORIDE 0.9 % IV SOLN
500.0000 mg | INTRAVENOUS | Status: DC
  Administered 2023-08-09 – 2023-08-11 (×3): 500 mg via INTRAVENOUS
  Filled 2023-08-09 (×3): qty 5

## 2023-08-09 MED ORDER — SODIUM CHLORIDE 0.9 % IV BOLUS
1000.0000 mL | Freq: Once | INTRAVENOUS | Status: AC
  Administered 2023-08-09: 1000 mL via INTRAVENOUS

## 2023-08-09 MED ORDER — FENTANYL CITRATE PF 50 MCG/ML IJ SOSY: 50.0000 ug | PREFILLED_SYRINGE | INTRAMUSCULAR | Status: DC | PRN

## 2023-08-09 MED ORDER — MIDAZOLAM-SODIUM CHLORIDE 100-0.9 MG/100ML-% IV SOLN
0.0000 mg/h | INTRAVENOUS | Status: DC
  Administered 2023-08-09 (×2): 2 mg/h via INTRAVENOUS
  Filled 2023-08-09: qty 100

## 2023-08-09 MED ORDER — IPRATROPIUM-ALBUTEROL 0.5-2.5 (3) MG/3ML IN SOLN
3.0000 mL | Freq: Four times a day (QID) | RESPIRATORY_TRACT | Status: DC
  Administered 2023-08-09 – 2023-08-10 (×2): 3 mL via RESPIRATORY_TRACT
  Filled 2023-08-09 (×2): qty 3

## 2023-08-09 MED ORDER — NOREPINEPHRINE 4 MG/250ML-% IV SOLN
2.0000 ug/min | INTRAVENOUS | Status: DC
  Filled 2023-08-09: qty 250

## 2023-08-09 MED ORDER — CHLORHEXIDINE GLUCONATE CLOTH 2 % EX PADS
6.0000 | MEDICATED_PAD | Freq: Every day | CUTANEOUS | Status: DC
  Administered 2023-08-09 – 2023-08-11 (×3): 6 via TOPICAL

## 2023-08-09 MED ORDER — SODIUM CHLORIDE 0.9 % IV SOLN
1.0000 g | INTRAVENOUS | Status: DC
  Administered 2023-08-09 – 2023-08-10 (×2): 1 g via INTRAVENOUS
  Filled 2023-08-09 (×2): qty 10

## 2023-08-09 MED ORDER — ORAL CARE MOUTH RINSE
15.0000 mL | OROMUCOSAL | Status: DC
  Administered 2023-08-09 – 2023-08-11 (×18): 15 mL via OROMUCOSAL

## 2023-08-09 MED ORDER — PROPOFOL 1000 MG/100ML IV EMUL
0.0000 ug/kg/min | INTRAVENOUS | Status: DC
  Administered 2023-08-09: 20 ug/kg/min via INTRAVENOUS
  Administered 2023-08-10: 40 ug/kg/min via INTRAVENOUS
  Filled 2023-08-09: qty 100

## 2023-08-09 MED ORDER — LORAZEPAM 2 MG/ML IJ SOLN
1.0000 mg | Freq: Once | INTRAMUSCULAR | Status: AC
  Administered 2023-08-09: 1 mg via INTRAVENOUS
  Filled 2023-08-09: qty 1

## 2023-08-09 MED ORDER — LABETALOL HCL 5 MG/ML IV SOLN
10.0000 mg | Freq: Once | INTRAVENOUS | Status: DC
  Filled 2023-08-09: qty 4

## 2023-08-09 MED ORDER — FENTANYL 2500MCG IN NS 250ML (10MCG/ML) PREMIX INFUSION
0.0000 ug/h | INTRAVENOUS | Status: DC
  Administered 2023-08-10: 25 ug/h via INTRAVENOUS
  Filled 2023-08-09: qty 250

## 2023-08-09 MED ORDER — IPRATROPIUM-ALBUTEROL 0.5-2.5 (3) MG/3ML IN SOLN: 3.0000 mL | Freq: Four times a day (QID) | RESPIRATORY_TRACT | Status: DC | PRN

## 2023-08-09 MED ORDER — ONDANSETRON HCL 4 MG/2ML IJ SOLN
4.0000 mg | Freq: Once | INTRAMUSCULAR | Status: AC
  Administered 2023-08-09: 4 mg via INTRAVENOUS
  Filled 2023-08-09: qty 2

## 2023-08-09 MED ORDER — ALBUTEROL SULFATE (2.5 MG/3ML) 0.083% IN NEBU
10.0000 mg | INHALATION_SOLUTION | Freq: Once | RESPIRATORY_TRACT | Status: AC
  Administered 2023-08-09: 10 mg via RESPIRATORY_TRACT
  Filled 2023-08-09: qty 12

## 2023-08-09 MED ORDER — AMLODIPINE BESYLATE 5 MG PO TABS
5.0000 mg | ORAL_TABLET | Freq: Every day | ORAL | 2 refills | Status: DC
  Filled 2023-08-09 (×2): qty 30, 30d supply, fill #0

## 2023-08-09 MED ORDER — FAMOTIDINE IN NACL 20-0.9 MG/50ML-% IV SOLN
20.0000 mg | Freq: Two times a day (BID) | INTRAVENOUS | Status: DC
Start: 2023-08-09 — End: 2023-08-10
  Administered 2023-08-09 – 2023-08-10 (×2): 20 mg via INTRAVENOUS
  Filled 2023-08-09 (×2): qty 50

## 2023-08-09 MED ORDER — SODIUM CHLORIDE 0.9 % IV SOLN: 250.0000 mL | INTRAVENOUS | Status: AC

## 2023-08-09 MED ORDER — HEPARIN SODIUM (PORCINE) 5000 UNIT/ML IJ SOLN
5000.0000 [IU] | Freq: Three times a day (TID) | INTRAMUSCULAR | Status: DC
  Administered 2023-08-09 – 2023-08-10 (×3): 5000 [IU] via SUBCUTANEOUS
  Filled 2023-08-09 (×3): qty 1

## 2023-08-09 MED ORDER — MIDAZOLAM BOLUS VIA INFUSION
0.0000 mg | INTRAVENOUS | Status: DC | PRN
  Administered 2023-08-09 (×2): 1 mg via INTRAVENOUS

## 2023-08-09 MED ORDER — SUCCINYLCHOLINE CHLORIDE 20 MG/ML IJ SOLN
INTRAMUSCULAR | Status: DC | PRN
  Administered 2023-08-09: 100 mg via INTRAVENOUS

## 2023-08-09 MED ORDER — NICOTINE 14 MG/24HR TD PT24
14.0000 mg | MEDICATED_PATCH | Freq: Every day | TRANSDERMAL | Status: DC
  Administered 2023-08-09 – 2023-08-12 (×4): 14 mg via TRANSDERMAL
  Filled 2023-08-09 (×4): qty 1

## 2023-08-09 MED ORDER — INSULIN ASPART 100 UNIT/ML IJ SOLN
0.0000 [IU] | INTRAMUSCULAR | Status: DC
  Administered 2023-08-09: 3 [IU] via SUBCUTANEOUS
  Administered 2023-08-10 (×4): 2 [IU] via SUBCUTANEOUS
  Administered 2023-08-10 – 2023-08-11 (×2): 3 [IU] via SUBCUTANEOUS
  Administered 2023-08-11 – 2023-08-12 (×3): 2 [IU] via SUBCUTANEOUS

## 2023-08-09 MED ORDER — METHYLPREDNISOLONE SODIUM SUCC 125 MG IJ SOLR
120.0000 mg | Freq: Two times a day (BID) | INTRAMUSCULAR | Status: DC
  Administered 2023-08-09 – 2023-08-10 (×2): 120 mg via INTRAVENOUS
  Filled 2023-08-09 (×2): qty 2

## 2023-08-09 MED ORDER — SODIUM CHLORIDE 0.9 % IV SOLN
12.5000 mg | Freq: Once | INTRAVENOUS | Status: AC
  Administered 2023-08-09: 12.5 mg via INTRAVENOUS
  Filled 2023-08-09: qty 12.5

## 2023-08-09 MED ORDER — LACTATED RINGERS IV BOLUS
500.0000 mL | Freq: Once | INTRAVENOUS | Status: AC
  Administered 2023-08-09: 500 mL via INTRAVENOUS

## 2023-08-09 MED ORDER — ONDANSETRON HCL 4 MG/2ML IJ SOLN: 4.0000 mg | Freq: Four times a day (QID) | INTRAMUSCULAR | Status: DC | PRN

## 2023-08-09 MED ORDER — ETOMIDATE 2 MG/ML IV SOLN
INTRAVENOUS | Status: DC | PRN
  Administered 2023-08-09: 20 mg via INTRAVENOUS

## 2023-08-09 NOTE — ED Notes (Signed)
 CCMD called for cardiac monitoring.

## 2023-08-09 NOTE — Telephone Encounter (Signed)
 Fluticasone  trlegy  ellipta has been attached to this encounter, fluticasone  trelegy ellipta  was last refilled 07/28/23 with 2 refills.   Amlodipine  has been refilled.

## 2023-08-09 NOTE — ED Triage Notes (Signed)
 Pt BIB [facility] from home. Pt c/o increased sob through out the day but got progressively worse tonight. Pt 90% on RA and grunting on EMS arrival.    2 Duoneb, 125mg  Solumedrol and 2g mag  given in route

## 2023-08-09 NOTE — Progress Notes (Signed)
Sputum collected and sent to the lab at this time.

## 2023-08-09 NOTE — H&P (Signed)
 NAME:  Francisco [name], MRN:  [id number], DOB:  11/28/1960, LOS: 0 ADMISSION DATE:  08/09/2023, CONSULTATION DATE:  08/09/2023 REFERRING MD:  Sueellen Emery, MD, CHIEF COMPLAINT:  COPD exacerbation   History of Present Illness:  63 y/o male with PMH for COPD and Tobacco abuse disorder who has had multiple ED visits for same in the last 2 months.  He has been seen by PCP as well who ordered PFTs noting his ED visits, hyperinflated lungs on cxr and his continued smoking.  Unfortunately he was BIB EMS tonight for worsening SOB.  He refused BiPAP in ED and due to worsening SOB he was intubated.  He vomited in CT and may have aspirated.  Pertinent  Medical History  COPD Tobacco abuse disorder  Significant Hospital Events: Including procedures, antibiotic start and stop dates in addition to other pertinent events   4/22: intubated in ED for COPD exacerbation  Interim History / Subjective:  N/a  Objective   Blood pressure 118/79, pulse (!) 101, temperature 98.2 F (36.8 C), resp. rate 20, height 6' (1.829 m), weight 75 kg, SpO2 100%.    Vent Mode: PRVC FiO2 (%):  [100 %] 100 % Set Rate:  [20 bmp] 20 bmp Vt Set:  [620 mL] 620 mL PEEP:  [5 cmH20] 5 cmH20 Plateau Pressure:  [24 cmH20] 24 cmH20  No intake or output data in the 24 hours ending 08/09/23 2106 Filed Weights   08/09/23 1928  Weight: 75 kg    Examination: General: on mech vent and sedated NAD HENT: PERRLA no icterus, ETT in place Lungs: CTA and diminished no wheezes no rales Cardiovascular: reg s1s2 no murmurs or gallops Abdomen: soft nt ns bs pos no guarding Extremities: no cyanosis, clubbing or edema, warm extremities, knees look mottled b/l Neuro: sedated and intubated   Resolved Hospital Problem list   N/a  Assessment & Plan:  Acute hypoxic and hypercapnic respiratory failure  -maybe triggered by allergies, recently was started on Loratadine  by PCP  -poorly controlled COPD, not seeing a Pulmonologist  outpatient  -recently switched form Symbicort  to Trelegy BID -follow ABGs  and Vent management  COPD exacerbation  -Solumedrol, nebs and vent management  -Antibiotics empirically as he did have a large vomit, can de-escalate antibiotics depending on clinical course SOB/DOE  -Secondary to COPD exacerbation, no intubated  -suggest outpatient Pulmonary rehab and Pulmonologist f/u  Tobacco abuse disorder  -was started on Nicoderm CQ  from PCP office, will add Nicotine  patch  Best Practice (right click and "Reselect all SmartList Selections" daily)   Diet/type: NPO w/ meds via tube DVT prophylaxis LMWH Pressure ulcer(s): N/A GI prophylaxis: H2B Lines: N/A Foley:  Yes, and it is still needed Code Status:  full code   Labs   CBC: Recent Labs  Lab 08/09/23 1923 08/09/23 1926  WBC  --  18.4*  NEUTROABS  --  11.4*  HGB 16.0  16.7 15.8  HCT 47.0  49.0 48.6  MCV  --  95.7  PLT  --  406*    Basic Metabolic Panel: Recent Labs  Lab 08/09/23 1923 08/09/23 1926  NA 142  143 144  K 3.5  3.5 3.7  CL 103 105  CO2  --  28  GLUCOSE 103* 104*  BUN 8 9  CREATININE 0.90 0.94  CALCIUM   --  9.3   GFR: Estimated Creatinine Clearance: 86.4 mL/min (by C-G formula based on SCr of 0.94 mg/dL). Recent Labs  Lab 08/09/23 1926  WBC 18.4*  Liver Function Tests: No results for input(s): "AST", "ALT", "ALKPHOS", "BILITOT", "PROT", "ALBUMIN " in the last 168 hours. No results for input(s): "LIPASE", "AMYLASE" in the last 168 hours. No results for input(s): "AMMONIA" in the last 168 hours.  ABG    Component Value Date/Time   PHART 7.367 02/07/2023 0304   PCO2ART 74.9 (HH) 02/07/2023 0304   PO2ART 111 (H) 02/07/2023 0304   HCO3 34.3 (H) 08/09/2023 1923   TCO2 31 08/09/2023 1923   TCO2 36 (H) 08/09/2023 1923   O2SAT 71 08/09/2023 1923     Coagulation Profile: No results for input(s): "INR", "PROTIME" in the last 168 hours.  Cardiac Enzymes: No results for input(s):  "CKTOTAL", "CKMB", "CKMBINDEX", "TROPONINI" in the last 168 hours.  HbA1C: Hgb A1c MFr Bld  Date/Time Value Ref Range Status  02/13/2023 08:20 PM 6.2 (H) 4.8 - 5.6 % Final    Comment:    (NOTE) Pre diabetes:          5.7%-6.4%  Diabetes:              >6.4%  Glycemic control for   <7.0% adults with diabetes   08/18/2019 08:38 AM 5.5 4.8 - 5.6 % Final    Comment:    RESULTS CONFIRMED BY MANUAL DILUTION (NOTE) Pre diabetes:          5.7%-6.4% Diabetes:              >6.4% Glycemic control for   <7.0% adults with diabetes     CBG: No results for input(s): "GLUCAP" in the last 168 hours.  Review of Systems:   Intubated/sedated unable to get ROS  Past Medical History:  He,  has a past medical history of CHF (congestive heart failure) (HCC), COPD (chronic obstructive pulmonary disease) (HCC), COPD with acute exacerbation (HCC) (10/21/2021), Coronary artery disease, Hepatitis-C, Hypertension, IV drug abuse (HCC), Medically noncompliant, MI (myocardial infarction) (HCC), and Tobacco abuse.   Surgical History:   Past Surgical History:  Procedure Laterality Date   BACK SURGERY     NECK SURGERY     RIGHT/LEFT HEART CATH AND CORONARY ANGIOGRAPHY N/A 04/30/2019   Procedure: RIGHT/LEFT HEART CATH AND CORONARY ANGIOGRAPHY;  Surgeon: Arleen Lacer, MD;  Location: [facility] INVASIVE CV LAB;  Service: Cardiovascular;  Laterality: N/A;     Social History:   reports that he has been smoking cigarettes. He has been exposed to tobacco smoke. He has never used smokeless tobacco. He reports current drug use. Drug: Methamphetamines. He reports that he does not drink alcohol.   Family History:  His family history includes Heart disease in his paternal grandfather; Rheum arthritis in his mother.   Allergies Allergies  Allergen Reactions   Ivp Dye [Iodinated Contrast Media] Other (See Comments)    Seizures    Tramadol Other (See Comments)    Seizures    Doxycycline  Other (See Comments)     Burning sensation to his hands.     Home Medications  Prior to Admission medications   Medication Sig Start Date End Date Taking? Authorizing Provider  albuterol  (PROVENTIL ) (2.5 MG/3ML) 0.083% nebulizer solution Use 1 vial (2.5 mg total) by nebulization every 6 (six) hours as needed for wheezing or shortness of breath. 06/16/23   Atway, Rayann N, DO  albuterol  (VENTOLIN  HFA) 108 (90 Base) MCG/ACT inhaler Inhale 2 puffs into the lungs every 4 (four) hours as needed for wheezing or shortness of breath. 06/10/23   Nooruddin, Dann Dust, MD  albuterol  (VENTOLIN  HFA) 108 (90 Base)  MCG/ACT inhaler Inhale 2 puffs into the lungs every 4 (four) hours as needed for wheezing or shortness of breath. 06/10/23   Nooruddin, Saad, MD  amLODipine  (NORVASC ) 5 MG tablet Take 1 tablet (5 mg total) by mouth daily. 08/09/23   [name], [name], DO  aspirin  EC 81 MG tablet Take 1 tablet (81 mg total) by mouth daily. Swallow whole. 02/15/23   Brayton Calin, MD  atorvastatin  (LIPITOR ) 40 MG tablet Take 1 tablet (40 mg total) by mouth daily. 07/06/23   [name], [name], DO  fluticasone  (FLONASE ) 50 MCG/ACT nasal spray Place 1 spray into both nostrils daily. 07/28/23   [name], [name], DO  Fluticasone -Umeclidin-Vilant (TRELEGY ELLIPTA ) 100-62.5-25 MCG/ACT AEPB Inhale 1 puff into the lungs daily. Patient taking differently: Inhale 2 puffs into the lungs daily. 2 puffs 2x per day 07/28/23 09/02/23  [name], [name], DO  lidocaine  (LIDODERM ) 5 % Place 2 patches onto the skin daily. Remove & Discard patch within 12 hours or as directed by MD Patient not taking: Reported on 02/14/2023 02/11/23   Brayton Calin, MD  lisinopril  (ZESTRIL ) 40 MG tablet Take 1/2 tablet (20 mg total) by mouth daily. 06/08/23   Amoako, Prince, MD  loratadine  (CLARITIN ) 10 MG tablet Take 1 tablet (10 mg total) by mouth daily. 07/28/23 07/27/24  [name], [name], DO  lovastatin  (MEVACOR ) 20 MG tablet Take 1 tablet (20 mg total) by mouth daily. 06/21/23   Nooruddin,  Saad, MD  methylPREDNISolone  (MEDROL  DOSEPAK) 4 MG TBPK tablet Take 6 tablets (60 mg total) by mouth on day 1, then 5 tablets (50 mg total) on day 2, then 4 tablets (40 mg total) on day 3, then 3 tablets (30 mg total) on day 4, then 2 tablets (20 mg total) on day 5, and then 1 tablet (10 mg total) on day 6. 07/20/23   Debbra Fairy, PA-C  metoprolol  tartrate (LOPRESSOR ) 25 MG tablet Take 1 tablet (25 mg total) by mouth 2 (two) times daily. 06/08/23 09/06/23  Amoako, Prince, MD  nicotine  (NICODERM CQ  - DOSED IN MG/24 HOURS) 14 mg/24hr patch Place 1 patch (14 mg total) onto the skin daily. 07/28/23   [name], [name], DO  nitroGLYCERIN  (NITROSTAT ) 0.4 MG SL tablet Place 1 tablet (0.4 mg total) under the tongue every 5 (five) minutes x 3 doses as needed for chest pain. 10/14/22   Nooruddin, Saad, MD  predniSONE  (DELTASONE ) 10 MG tablet Take 4 tablets (40 mg total) by mouth daily. 07/17/23   Burnette Carte, MD     Critical care time: 67   The patient is critically ill with multiple organ system failure and requires high complexity decision making for assessment and support, frequent evaluation and titration of therapies, advanced monitoring, review of radiographic studies and interpretation of complex data.   Critical Care Time devoted to patient care services, exclusive of separately billable procedures, described in this note is 35 minutes.  Claven Cumming, MD [facility] See Amion for pager  If no response to pager , please call [phone] until 7pm After 7:00 pm call Elink  [phone] 08/09/2023, 9:07 PM

## 2023-08-09 NOTE — Progress Notes (Signed)
 Pt belongings: Clothing, shoes, wallet (including cards and $6).

## 2023-08-09 NOTE — Telephone Encounter (Signed)
 Copied from CRM [id number]. Topic: Clinical - Medication Refill >> Aug 09, 2023 10:16 AM Tiffany H wrote: Most Recent Primary Care Visit:  Provider: Lanney Pitts  Department: [facility]  Visit Type: OPEN ESTABLISHED  Date: 07/28/2023  Medication: Fluticasone -Umeclidin-Vilant (TRELEGY ELLIPTA ) 100-62.5-25 MCG/ACT AEPB  Has the patient contacted their pharmacy? Yes (Agent: If no, request that the patient contact the pharmacy for the refill. If patient does not wish to contact the pharmacy document the reason why and proceed with request.) (Agent: If yes, when and what did the pharmacy advise?) Pharmacy advised medication won't be covered. Please write new prescription.   Is this the correct pharmacy for this prescription? Yes If no, delete pharmacy and type the correct one.  This is the patient's preferred pharmacy:  [facility] [address] [place] [address] Phone: [phone] Fax: [phone]  [id number] [place], [place] - [address] AT [facility] [address] & [address] [place] [address] Phone: [phone] Fax: [phone]  [facility] [address] [place] [address] Phone: [phone] Fax: [phone]   Has the prescription been filled recently?   Is the patient out of the medication?   Has the patient been seen for an appointment in the last year OR does the patient have an upcoming appointment?   Can we respond through MyChart?   Agent: Please be advised that Rx refills may take up to 3 business days. We ask that you follow-up with your pharmacy.

## 2023-08-09 NOTE — ED Provider Notes (Signed)
 [place] EMERGENCY DEPARTMENT AT [facility] Provider Note   CSN: [id number] Arrival date & time: 08/09/23  1915     History  Chief Complaint  Patient presents with   Shortness of Breath    Francisco [name] is a 63 y.o. male.  Pt is a 63 yo male with pmhx significant for COPD, HTN, CAD, CHF, tobacco abuse and hx IVDA.  Pt has been sob all day, but did not call EMS until tonight when he had a very hard time breathing.  Pt is very sob, so is a poor historian.  EMS did given him 2 duonebs, 125 mg solumedrol, and 2 g Mag en route.  Pt is still very sob and hypoxic.  He is refusing bipap saying he's too claustrophobic.  He is willing to be intubated if he worsens.       Home Medications Prior to Admission medications   Medication Sig Start Date End Date Taking? Authorizing Provider  albuterol  (PROVENTIL ) (2.5 MG/3ML) 0.083% nebulizer solution Use 1 vial (2.5 mg total) by nebulization every 6 (six) hours as needed for wheezing or shortness of breath. 06/16/23   Atway, Rayann N, DO  albuterol  (VENTOLIN  HFA) 108 (90 Base) MCG/ACT inhaler Inhale 2 puffs into the lungs every 4 (four) hours as needed for wheezing or shortness of breath. 06/10/23   Nooruddin, Dann Dust, MD  albuterol  (VENTOLIN  HFA) 108 (90 Base) MCG/ACT inhaler Inhale 2 puffs into the lungs every 4 (four) hours as needed for wheezing or shortness of breath. 06/10/23   Nooruddin, Saad, MD  amLODipine  (NORVASC ) 5 MG tablet Take 1 tablet (5 mg total) by mouth daily. 08/09/23   [name], [name], DO  aspirin  EC 81 MG tablet Take 1 tablet (81 mg total) by mouth daily. Swallow whole. 02/15/23   Brayton Calin, MD  atorvastatin  (LIPITOR ) 40 MG tablet Take 1 tablet (40 mg total) by mouth daily. 07/06/23   [name], [name], DO  fluticasone  (FLONASE ) 50 MCG/ACT nasal spray Place 1 spray into both nostrils daily. 07/28/23   [name], [name], DO  Fluticasone -Umeclidin-Vilant (TRELEGY ELLIPTA ) 100-62.5-25 MCG/ACT AEPB Inhale 1 puff into the  lungs daily. Patient taking differently: Inhale 2 puffs into the lungs daily. 2 puffs 2x per day 07/28/23 09/02/23  [name], [name], DO  lidocaine  (LIDODERM ) 5 % Place 2 patches onto the skin daily. Remove & Discard patch within 12 hours or as directed by MD Patient not taking: Reported on 02/14/2023 02/11/23   Brayton Calin, MD  lisinopril  (ZESTRIL ) 40 MG tablet Take 1/2 tablet (20 mg total) by mouth daily. 06/08/23   Amoako, Prince, MD  loratadine  (CLARITIN ) 10 MG tablet Take 1 tablet (10 mg total) by mouth daily. 07/28/23 07/27/24  [name], [name], DO  lovastatin  (MEVACOR ) 20 MG tablet Take 1 tablet (20 mg total) by mouth daily. 06/21/23   Nooruddin, Saad, MD  methylPREDNISolone  (MEDROL  DOSEPAK) 4 MG TBPK tablet Take 6 tablets (60 mg total) by mouth on day 1, then 5 tablets (50 mg total) on day 2, then 4 tablets (40 mg total) on day 3, then 3 tablets (30 mg total) on day 4, then 2 tablets (20 mg total) on day 5, and then 1 tablet (10 mg total) on day 6. 07/20/23   Debbra Fairy, PA-C  metoprolol  tartrate (LOPRESSOR ) 25 MG tablet Take 1 tablet (25 mg total) by mouth 2 (two) times daily. 06/08/23 09/06/23  Fay Hoop, MD  nicotine  (NICODERM CQ  - DOSED IN MG/24 HOURS) 14 mg/24hr patch Place 1 patch (14 mg  total) onto the skin daily. 07/28/23   [name], [name], DO  nitroGLYCERIN  (NITROSTAT ) 0.4 MG SL tablet Place 1 tablet (0.4 mg total) under the tongue every 5 (five) minutes x 3 doses as needed for chest pain. 10/14/22   Nooruddin, Saad, MD  predniSONE  (DELTASONE ) 10 MG tablet Take 4 tablets (40 mg total) by mouth daily. 07/17/23   Burnette Carte, MD      Allergies    Ivp dye [iodinated contrast media], Tramadol, and Doxycycline     Review of Systems   Review of Systems  Respiratory:  Positive for shortness of breath.   All other systems reviewed and are negative.   Physical Exam Updated Vital Signs BP 121/79   Pulse (!) 101   Temp 99.5 F (37.5 C)   Resp 20   Ht 6' (1.829 m)   Wt 75 kg    SpO2 100%   BMI 22.42 kg/m  Physical Exam Vitals and nursing note reviewed.  Constitutional:      General: He is in acute distress.     Appearance: He is ill-appearing and diaphoretic.  HENT:     Head: Normocephalic and atraumatic.     Mouth/Throat:     Pharynx: Oropharynx is clear.  Eyes:     Extraocular Movements: Extraocular movements intact.     Pupils: Pupils are equal, round, and reactive to light.  Cardiovascular:     Rate and Rhythm: Regular rhythm. Tachycardia present.  Pulmonary:     Effort: Tachypnea, accessory muscle usage and respiratory distress present.     Breath sounds: Decreased air movement present.  Abdominal:     General: Bowel sounds are normal.     Palpations: Abdomen is soft.  Musculoskeletal:        General: Normal range of motion.     Cervical back: Normal range of motion and neck supple.  Skin:    Capillary Refill: Capillary refill takes less than 2 seconds.  Neurological:     General: No focal deficit present.     Mental Status: He is oriented to person, place, and time.  Psychiatric:        Mood and Affect: Mood is anxious.     ED Results / Procedures / Treatments   Labs (all labs ordered are listed, but only abnormal results are displayed) Labs Reviewed  BASIC METABOLIC PANEL WITH GFR - Abnormal; Notable for the following components:      Result Value   Glucose, Bld 104 (*)    All other components within normal limits  CBC WITH DIFFERENTIAL/PLATELET - Abnormal; Notable for the following components:   WBC 18.4 (*)    Platelets 406 (*)    Neutro Abs 11.4 (*)    Lymphs Abs 4.3 (*)    Monocytes Absolute 2.4 (*)    Abs Immature Granulocytes 0.11 (*)    All other components within normal limits  RAPID URINE DRUG SCREEN, HOSP PERFORMED - Abnormal; Notable for the following components:   Amphetamines POSITIVE (*)    All other components within normal limits  I-STAT CHEM 8, ED - Abnormal; Notable for the following components:   Glucose, Bld  103 (*)    Calcium , Ion 1.14 (*)    All other components within normal limits  I-STAT VENOUS BLOOD GAS, ED - Abnormal; Notable for the following components:   pCO2, Ven 64.4 (*)    Bicarbonate 34.3 (*)    TCO2 36 (*)    Acid-Base Excess 6.0 (*)    Calcium ,  Ion 1.12 (*)    All other components within normal limits  I-STAT ARTERIAL BLOOD GAS, ED - Abnormal; Notable for the following components:   pH, Arterial 7.272 (*)    pCO2 arterial 67.5 (*)    pO2, Arterial 586 (*)    Bicarbonate 31.0 (*)    TCO2 33 (*)    Potassium 3.4 (*)    All other components within normal limits  RESP PANEL BY RT-PCR (RSV, FLU A&B, COVID)  RVPGX2  EXPECTORATED SPUTUM ASSESSMENT W GRAM STAIN, RFLX TO RESP C  BRAIN NATRIURETIC PEPTIDE  TRIGLYCERIDES  URINALYSIS, ROUTINE W REFLEX MICROSCOPIC  CBC  CREATININE, SERUM  URINALYSIS, ROUTINE W REFLEX MICROSCOPIC  BLOOD GAS, ARTERIAL  CBC  BASIC METABOLIC PANEL WITH GFR  BLOOD GAS, ARTERIAL  MAGNESIUM   PHOSPHORUS    EKG EKG Interpretation Date/Time:  Tuesday August 09 2023 19:27:32 EDT Ventricular Rate:  127 PR Interval:  144 QRS Duration:  112 QT Interval:  295 QTC Calculation: 429 R Axis:   45  Text Interpretation: Sinus tachycardia Consider right atrial enlargement Borderline intraventricular conduction delay Artifact in lead(s) I II III aVR aVL aVF V5 V6 duplicate Confirmed by Sueellen Emery [phone]) on 08/09/2023 8:35:12 PM  Radiology DG Chest Portable 1 View Result Date: 08/09/2023 CLINICAL DATA:  ETT placement EXAM: PORTABLE CHEST 1 VIEW COMPARISON:  None Available. FINDINGS: Lungs are clear. No pneumothorax or pleural effusion. Normal pulmonary vasculature. Unremarkable cardiac silhouette. Endotracheal tube tip 4 cm above carina. NG tube tip extends below the diaphragm and off the x-ray. IMPRESSION: No active disease. Electronically Signed   By: Sydell Eva M.D.   On: 08/09/2023 21:22   CT Head Wo Contrast Result Date: 08/09/2023 CLINICAL  DATA:  Status post trauma. EXAM: CT HEAD WITHOUT CONTRAST TECHNIQUE: Contiguous axial images were obtained from the base of the skull through the vertex without intravenous contrast. RADIATION DOSE REDUCTION: This exam was performed according to the departmental dose-optimization program which includes automated exposure control, adjustment of the mA and/or kV according to patient size and/or use of iterative reconstruction technique. COMPARISON:  February 13, 2023 FINDINGS: Brain: There is mild cerebral atrophy with widening of the extra-axial spaces and ventricular dilatation. There are areas of decreased attenuation within the white matter tracts of the supratentorial brain, consistent with microvascular disease changes. A chronic left basal ganglia infarct is noted. Vascular: Marked severity bilateral cavernous carotid artery calcification is noted. Skull: Normal. Negative for fracture or focal lesion. Sinuses/Orbits: There is mild left maxillary sinus mucosal thickening. Other: Endotracheal and enteric tubes are noted. IMPRESSION: 1. No acute intracranial abnormality. 2. Generalized cerebral atrophy with chronic white matter small vessel ischemic changes. 3. Chronic left basal ganglia infarct. Electronically Signed   By: Virgle Grime M.D.   On: 08/09/2023 20:07   DG Chest Port 1 View Result Date: 08/09/2023 CLINICAL DATA:  Shortness of breath EXAM: PORTABLE CHEST 1 VIEW COMPARISON:  07/20/2023 FINDINGS: Endotracheal tube tip in the intrathoracic trachea 6.9 cm from the carina. Subdiaphragmatic enteric tube. No focal consolidation, pleural effusion, or pneumothorax. No displaced rib fractures. Stable cardiomediastinal silhouette. IMPRESSION: Endotracheal tube tip in the intrathoracic trachea 6.9 cm from the carina. No acute cardiopulmonary process. Electronically Signed   By: Rozell Cornet M.D.   On: 08/09/2023 20:04   DG Abdomen 1 View Result Date: 08/09/2023 CLINICAL DATA:  Orogastric tube EXAM:  ABDOMEN - 1 VIEW COMPARISON:  None Available. FINDINGS: Orogastric tube tip is in the proximal body of the stomach. The  stomach is mildly dilated. There are postsurgical changes in the lower lumbar spine. IMPRESSION: Orogastric tube tip is in the proximal body of the stomach. Electronically Signed   By: Tyron Gallon M.D.   On: 08/09/2023 20:02    Procedures Procedure Name: Intubation Date/Time: 08/09/2023 8:36 PM  Performed by: Sueellen Emery, MDPre-anesthesia Checklist: Patient identified, Patient being monitored, Emergency Drugs available, Timeout performed and Suction available Oxygen  Delivery Method: Non-rebreather mask Preoxygenation: Pre-oxygenation with 100% oxygen  Induction Type: Rapid sequence Ventilation: Mask ventilation without difficulty Laryngoscope Size: Glidescope and 3 Tube size: 7.5 mm Number of attempts: 1 Placement Confirmation: ETT inserted through vocal cords under direct vision, CO2 detector and Breath sounds checked- equal and bilateral Secured at: 22 cm Tube secured with: ETT holder Dental Injury: Teeth and Oropharynx as per pre-operative assessment         Medications Ordered in ED Medications  etomidate  (AMIDATE ) injection (20 mg Intravenous Given 08/09/23 1937)  succinylcholine  (ANECTINE ) injection (100 mg Intravenous Given 08/09/23 1937)  propofol  (DIPRIVAN ) 1000 MG/100ML infusion (40 mcg/kg/min  75 kg Intravenous Rate/Dose Change 08/09/23 2010)  midazolam  (VERSED ) 100 mg/100 mL (1 mg/mL) premix infusion (2 mg/hr Intravenous Bolus 08/09/23 2056)  midazolam  (VERSED ) bolus via infusion 0-5 mg (1 mg Intravenous Bolus from Bag 08/09/23 2009)  fentaNYL  (SUBLIMAZE ) injection 50 mcg (has no administration in time range)  fentaNYL  (SUBLIMAZE ) injection 50-200 mcg (has no administration in time range)  heparin  injection 5,000 Units (has no administration in time range)  lactated ringers  infusion (has no administration in time range)  insulin  aspart (novoLOG )  injection 0-15 Units (has no administration in time range)  ondansetron  (ZOFRAN ) injection 4 mg (has no administration in time range)  ipratropium-albuterol  (DUONEB) 0.5-2.5 (3) MG/3ML nebulizer solution 3 mL (has no administration in time range)  ipratropium-albuterol  (DUONEB) 0.5-2.5 (3) MG/3ML nebulizer solution 3 mL (has no administration in time range)  cefTRIAXone  (ROCEPHIN ) 1 g in sodium chloride  0.9 % 100 mL IVPB (has no administration in time range)  azithromycin  (ZITHROMAX ) 500 mg in sodium chloride  0.9 % 250 mL IVPB (has no administration in time range)  methylPREDNISolone  sodium succinate (SOLU-MEDROL ) 125 mg/2 mL injection 120 mg (has no administration in time range)  nicotine  (NICODERM CQ  - dosed in mg/24 hours) patch 14 mg (has no administration in time range)  famotidine  (PEPCID ) IVPB 20 mg premix (has no administration in time range)  albuterol  (PROVENTIL ) (2.5 MG/3ML) 0.083% nebulizer solution 10 mg (10 mg Nebulization Given 08/09/23 1926)  LORazepam  (ATIVAN ) injection 1 mg (1 mg Intravenous Given 08/09/23 1926)  ondansetron  (ZOFRAN ) injection 4 mg (4 mg Intravenous Given 08/09/23 2006)  promethazine  (PHENERGAN ) 12.5 mg in sodium chloride  0.9 % 50 mL IVPB (12.5 mg Intravenous New Bag/Given 08/09/23 2053)  sodium chloride  0.9 % bolus 1,000 mL (1,000 mLs Intravenous New Bag/Given 08/09/23 2108)    ED Course/ Medical Decision Making/ A&P                                 Medical Decision Making Amount and/or Complexity of Data Reviewed Labs: ordered. Radiology: ordered.  Risk Prescription drug management. Decision regarding hospitalization.   This patient presents to the ED for concern of sob, this involves an extensive number of treatment options, and is a complaint that carries with it a high risk of complications and morbidity.  The differential diagnosis includes copd exac, pna, covid/flu/rsv   Co morbidities that complicate the patient evaluation  COPD, HTN, CAD, CHF,  tobacco abuse and hx IVDA   Additional history obtained:  Additional history obtained from epic chart review External records from outside source obtained and reviewed including EMS report   Lab Tests:  I Ordered, and personally interpreted labs.  The pertinent results include:  uds + for amphetamines, abg (after intubation) with pH 7.2, pCO2 67.5; cbc with wbc elevated at 18.4, otherwise nl, bmp nl   Imaging Studies ordered:  I ordered imaging studies including cxr, ct head, kub  I independently visualized and interpreted imaging which showed  CXR: Endotracheal tube tip in the intrathoracic trachea 6.9 cm from the  carina. No acute cardiopulmonary process.  KUB: Orogastric tube tip is in the proximal body of the stomach.  CT head:  No acute intracranial abnormality.  2. Generalized cerebral atrophy with chronic white matter small  vessel ischemic changes.  3. Chronic left basal ganglia infarct.  Repeat CXR: No active disease.  I agree with the radiologist interpretation   Cardiac Monitoring:  The patient was maintained on a cardiac monitor.  I personally viewed and interpreted the cardiac monitored which showed an underlying rhythm of: st   Medicines ordered and prescription drug management:  I ordered medication including ivfs/nebs  for sx  Reevaluation of the patient after these medicines showed that the patient worsened I have reviewed the patients home medicines and have made adjustments as needed   Test Considered:  Ct    Critical Interventions:  intubation   Consultations Obtained:  I requested consultation with the intensivist (Dr. Mason Sole),  and discussed lab and imaging findings as well as pertinent plan - he will admit   Problem List / ED Course:  COPD exacerbation:  pt started on additional nebs and refused bipap saying he was too claustrophobic.  He was given some ativan  to try to help the anxiety as he was unable to tolerate the neb mask.   However, he continued to deteriorate and became unresponsive.  So, he was intubated.  I suspect he had significant hypercarbia. N/v:  despite OG being in correct position, he did have some vomiting.  I suspect the OG could not keep up with the volume of fluid in his stomach.  Zofran  given.  Rpt xr shows no aspiration HTN:  pt was very hypertensive upon arrival.  I suspect it was due to work of breathing combined with meth abuse.  We did a CT head to make sure somnolence was not due to a head bleed and the ct was clear.    Reevaluation:  After the interventions noted above, I reevaluated the patient and found that they have :improved   Social Determinants of Health:  Lives at home   Dispostion:  After consideration of the diagnostic results and the patients response to treatment, I feel that the patent would benefit from admission.  CRITICAL CARE Performed by: Sueellen Emery   Total critical care time: 45 minutes  Critical care time was exclusive of separately billable procedures and treating other patients.  Critical care was necessary to treat or prevent imminent or life-threatening deterioration.  Critical care was time spent personally by me on the following activities: development of treatment plan with patient and/or surrogate as well as nursing, discussions with consultants, evaluation of patient's response to treatment, examination of patient, obtaining history from patient or surrogate, ordering and performing treatments and interventions, ordering and review of laboratory studies, ordering and review of radiographic studies, pulse oximetry and re-evaluation of patient's condition.  Final Clinical Impression(s) / ED Diagnoses Final diagnoses:  COPD exacerbation (HCC)  Acute respiratory failure with hypoxia and hypercapnia (HCC)  Methamphetamine abuse (HCC)    Rx / DC Orders ED Discharge Orders     None         Sueellen Emery, MD 08/09/23  2138

## 2023-08-09 NOTE — Progress Notes (Signed)
 eLink Physician-Brief Progress Note Patient Name: Francisco [name] DOB: 12/21/1960 MRN: [id number]   Date of Service  08/09/2023  HPI/Events of Note  Patient admitted with acute hypoxemic / hypercapnic respiratory failure requiring intubation and mechanical ventilation, he has also had questionable seizures.  eICU Interventions  New Patient Evaluation.        Francisco [name] [name] 08/09/2023, 11:40 PM

## 2023-08-09 NOTE — Progress Notes (Addendum)
 eLink Physician-Brief Progress Note Patient Name: Francisco [name] DOB: [birth year] MRN: [id number]   Date of Service  08/09/2023  HPI/Events of Note  Patient intubated, family unaware. Blood pressure is soft on Propofol  and versed .  eICU Interventions  Will call his father to inform him. RN instructed to wean patient off Propofol  since he is on a Versed  gtt, Will order Fentanyl  gtt for analgesia. EEG ordered for observed intermittent twitches. ADDENDUM I spoke with Francisco [name] (patient's father) and his step mother, I updated them regarding his status and provided the [facility] phone number to them to call for updates.        Ranata Laughery U Shadai Mcclane 08/09/2023, 11:11 PM

## 2023-08-09 NOTE — ED Notes (Signed)
 Pt refusing bipap and NRB at this time. Dr. Scarlette Currier at the bedside

## 2023-08-10 ENCOUNTER — Encounter (HOSPITAL_COMMUNITY)

## 2023-08-10 ENCOUNTER — Inpatient Hospital Stay (HOSPITAL_COMMUNITY)

## 2023-08-10 DIAGNOSIS — R569 Unspecified convulsions: Secondary | ICD-10-CM

## 2023-08-10 DIAGNOSIS — Z720 Tobacco use: Secondary | ICD-10-CM

## 2023-08-10 DIAGNOSIS — J441 Chronic obstructive pulmonary disease with (acute) exacerbation: Secondary | ICD-10-CM

## 2023-08-10 DIAGNOSIS — J9602 Acute respiratory failure with hypercapnia: Secondary | ICD-10-CM

## 2023-08-10 DIAGNOSIS — J9601 Acute respiratory failure with hypoxia: Secondary | ICD-10-CM

## 2023-08-10 DIAGNOSIS — R739 Hyperglycemia, unspecified: Secondary | ICD-10-CM

## 2023-08-10 DIAGNOSIS — E440 Moderate protein-calorie malnutrition: Secondary | ICD-10-CM | POA: Insufficient documentation

## 2023-08-10 LAB — BASIC METABOLIC PANEL WITH GFR
Anion gap: 14 (ref 5–15)
BUN: 12 mg/dL (ref 8–23)
CO2: 24 mmol/L (ref 22–32)
Calcium: 8.9 mg/dL (ref 8.9–10.3)
Chloride: 104 mmol/L (ref 98–111)
Creatinine, Ser: 1.1 mg/dL (ref 0.61–1.24)
GFR, Estimated: >60 mL/min (ref >60)
Glucose, Bld: 158 mg/dL — ABNORMAL HIGH (ref 70–99)
Potassium: 3.8 mmol/L (ref 3.5–5.1)
Sodium: 142 mmol/L (ref 135–145)

## 2023-08-10 LAB — CBC
HCT: 41.2 % (ref 39.0–52.0)
Hemoglobin: 13.8 g/dL (ref 13.0–17.0)
MCH: 31.4 pg (ref 26.0–34.0)
MCHC: 33.5 g/dL (ref 30.0–36.0)
MCV: 93.8 fL (ref 80.0–100.0)
Platelets: 330 10*3/uL (ref 150–400)
RBC: 4.39 MIL/uL (ref 4.22–5.81)
RDW: 14.2 % (ref 11.5–15.5)
WBC: 11.6 10*3/uL — ABNORMAL HIGH (ref 4.0–10.5)
nRBC: 0 % (ref 0.0–0.2)

## 2023-08-10 LAB — POCT I-STAT 7, (LYTES, BLD GAS, ICA,H+H)
Acid-Base Excess: 3 mmol/L — ABNORMAL HIGH (ref 0.0–2.0)
Bicarbonate: 27.2 mmol/L (ref 20.0–28.0)
Calcium, Ion: 1.17 mmol/L (ref 1.15–1.40)
HCT: 38 % — ABNORMAL LOW (ref 39.0–52.0)
Hemoglobin: 12.9 g/dL — ABNORMAL LOW (ref 13.0–17.0)
O2 Saturation: 99 %
Patient temperature: 98.7
Potassium: 3.7 mmol/L (ref 3.5–5.1)
Sodium: 141 mmol/L (ref 135–145)
TCO2: 28 mmol/L (ref 22–32)
pCO2 arterial: 40.9 mmHg (ref 32–48)
pH, Arterial: 7.431 (ref 7.35–7.45)
pO2, Arterial: 133 mmHg — ABNORMAL HIGH (ref 83–108)

## 2023-08-10 LAB — URINALYSIS, ROUTINE W REFLEX MICROSCOPIC
Bacteria, UA: NONE SEEN
Bilirubin Urine: NEGATIVE
Glucose, UA: NEGATIVE mg/dL
Ketones, ur: NEGATIVE mg/dL
Leukocytes,Ua: NEGATIVE
Nitrite: NEGATIVE
Protein, ur: >=300 mg/dL — AB
Specific Gravity, Urine: 1.017 (ref 1.005–1.030)
pH: 5 (ref 5.0–8.0)

## 2023-08-10 LAB — GLUCOSE, CAPILLARY
Glucose-Capillary: 127 mg/dL — ABNORMAL HIGH (ref 70–99)
Glucose-Capillary: 128 mg/dL — ABNORMAL HIGH (ref 70–99)
Glucose-Capillary: 132 mg/dL — ABNORMAL HIGH (ref 70–99)
Glucose-Capillary: 133 mg/dL — ABNORMAL HIGH (ref 70–99)
Glucose-Capillary: 160 mg/dL — ABNORMAL HIGH (ref 70–99)
Glucose-Capillary: 163 mg/dL — ABNORMAL HIGH (ref 70–99)

## 2023-08-10 LAB — PHOSPHORUS: Phosphorus: 3 mg/dL (ref 2.5–4.6)

## 2023-08-10 LAB — MAGNESIUM: Magnesium: 2.1 mg/dL (ref 1.7–2.4)

## 2023-08-10 LAB — TRIGLYCERIDES: Triglycerides: 44 mg/dL (ref <150)

## 2023-08-10 LAB — MRSA NEXT GEN BY PCR, NASAL: MRSA by PCR Next Gen: NOT DETECTED

## 2023-08-10 MED ORDER — DEXMEDETOMIDINE HCL IN NACL 400 MCG/100ML IV SOLN
0.0000 ug/kg/h | INTRAVENOUS | Status: DC
  Administered 2023-08-10: 0.8 ug/kg/h via INTRAVENOUS
  Administered 2023-08-10: 0.4 ug/kg/h via INTRAVENOUS
  Administered 2023-08-11: 0.8 ug/kg/h via INTRAVENOUS
  Filled 2023-08-10 (×3): qty 100

## 2023-08-10 MED ORDER — PROSOURCE TF20 ENFIT COMPATIBL EN LIQD
60.0000 mL | Freq: Every day | ENTERAL | Status: DC
  Administered 2023-08-10: 60 mL
  Filled 2023-08-10: qty 60

## 2023-08-10 MED ORDER — OSMOLITE 1.2 CAL PO LIQD
1000.0000 mL | ORAL | Status: DC
  Administered 2023-08-10: 1000 mL
  Filled 2023-08-10 (×2): qty 1000

## 2023-08-10 MED ORDER — LACTATED RINGERS IV BOLUS
1000.0000 mL | Freq: Once | INTRAVENOUS | Status: AC
  Administered 2023-08-10: 1000 mL via INTRAVENOUS

## 2023-08-10 MED ORDER — METHYLPREDNISOLONE SODIUM SUCC 40 MG IJ SOLR
40.0000 mg | Freq: Every day | INTRAMUSCULAR | Status: DC
  Administered 2023-08-11: 40 mg via INTRAVENOUS
  Filled 2023-08-10: qty 1

## 2023-08-10 MED ORDER — IPRATROPIUM-ALBUTEROL 0.5-2.5 (3) MG/3ML IN SOLN
3.0000 mL | RESPIRATORY_TRACT | Status: DC | PRN
  Administered 2023-08-11: 3 mL via RESPIRATORY_TRACT
  Filled 2023-08-10: qty 3

## 2023-08-10 MED ORDER — ARFORMOTEROL TARTRATE 15 MCG/2ML IN NEBU
15.0000 ug | INHALATION_SOLUTION | Freq: Two times a day (BID) | RESPIRATORY_TRACT | Status: DC
  Administered 2023-08-10 – 2023-08-11 (×4): 15 ug via RESPIRATORY_TRACT
  Filled 2023-08-10 (×2): qty 2

## 2023-08-10 MED ORDER — LABETALOL HCL 5 MG/ML IV SOLN
10.0000 mg | INTRAVENOUS | Status: DC | PRN
  Administered 2023-08-11 (×2): 10 mg via INTRAVENOUS
  Filled 2023-08-10 (×3): qty 4

## 2023-08-10 MED ORDER — REVEFENACIN 175 MCG/3ML IN SOLN
175.0000 ug | Freq: Every day | RESPIRATORY_TRACT | Status: DC
  Administered 2023-08-10 – 2023-08-11 (×2): 175 ug via RESPIRATORY_TRACT
  Filled 2023-08-10 (×2): qty 3

## 2023-08-10 MED ORDER — FAMOTIDINE 20 MG PO TABS
20.0000 mg | ORAL_TABLET | Freq: Two times a day (BID) | ORAL | Status: DC
  Administered 2023-08-10: 20 mg
  Filled 2023-08-10: qty 1

## 2023-08-10 MED ORDER — ENOXAPARIN SODIUM 40 MG/0.4ML IJ SOSY
40.0000 mg | PREFILLED_SYRINGE | INTRAMUSCULAR | Status: DC
  Administered 2023-08-10: 40 mg via SUBCUTANEOUS
  Filled 2023-08-10 (×2): qty 0.4

## 2023-08-10 NOTE — Progress Notes (Addendum)
 NAME:  Francisco [name], MRN:  [id number], DOB:  1961-01-30, LOS: 1 ADMISSION DATE:  08/09/2023, CONSULTATION DATE:  08/09/2023 REFERRING MD:  Sueellen Emery, MD, CHIEF COMPLAINT:  COPD exacerbation   History of Present Illness:  63 y/o male with PMH for COPD and Tobacco abuse disorder who has had multiple ED visits for same in the last 2 months.  He has been seen by PCP as well who ordered PFTs noting his ED visits, hyperinflated lungs on cxr and his continued smoking.  Unfortunately he was BIB EMS tonight for worsening SOB.  He refused BiPAP in ED and due to worsening SOB he was intubated.  He vomited in CT and may have aspirated.  Pertinent  Medical History  COPD Tobacco abuse disorder  Significant Hospital Events: Including procedures, antibiotic start and stop dates in addition to other pertinent events   4/22: intubated in ED for COPD exacerbation  Interim History / Subjective:  Agitated when awake today.   Objective   Blood pressure (!) 133/93, pulse 87, temperature 97.8 F (36.6 C), temperature source Oral, resp. rate (!) 24, height 6' (1.829 m), weight 75 kg, SpO2 92%.    Vent Mode: PRVC FiO2 (%):  [30 %-100 %] 30 % Set Rate:  [20 bmp-24 bmp] 24 bmp Vt Set:  [620 mL] 620 mL PEEP:  [5 cmH20] 5 cmH20 Plateau Pressure:  [24 cmH20] 24 cmH20   Intake/Output Summary (Last 24 hours) at 08/10/2023 1034 Last data filed at 08/10/2023 0900 Gross per 24 hour  Intake 2109.45 ml  Output 185 ml  Net 1924.45 ml   Filed Weights   08/09/23 1928  Weight: 75 kg    Examination: General: critically ill appearing man lying in bed intubated, sedated HENT: /AT, eyes anicteric Lungs: breathing comfortably on MV, apneic on multiple SBT attempts. Thick yellow sputum Cardiovascular: S1S2, RRR Abdomen: soft, NT Extremities: no edema, no cyanosis Neuro:  very agitated, this evening more directable and able to follow commands.   BG 120-160s BUN 12 Cr 1.1 WBC 11.6 H/H 13.8/41.2 Platelets  330  Resolved Hospital Problem list   N/a  Assessment & Plan:  Acute hypoxic and hypercapnic respiratory failure Acute COPD exacerbation, worry allergies may have triggered this vs pneumonia -steroids decreased to solumedrol 40mg  daily -adding brovana , yupelri ; con't PRN duonebs -collect tract aspirate culture -con't CAP antibiotics- ceftriaxone , azithromycin  (Qtc 420) -LTVV -VAP prevention protocol -PAD protocol for sedation -daily SAT & SBT  Tobacco abuse disorder -nicotine  replacement therapy, recommend cessation  Concern for seizures-- physical exam today not concerning -cEEG; hopefully can d/c tomorrow  Hyperglycemia, prediabetes, A1c 6.2 -SSI PRN -goal BG 140-180  At risk for malnutrition -starting TF tonight  H/o HTN -hold amlodipine , lisinopril , metoprolol  for now  HLD -statin   Best Practice (right click and "Reselect all SmartList Selections" daily)   Diet/type: tubefeeds DVT prophylaxis LMWH Pressure ulcer(s): N/A GI prophylaxis: H2B Lines: N/A Foley:  Yes, and it is still needed Code Status:  full code   Labs   CBC: Recent Labs  Lab 08/09/23 1926 08/09/23 2112 08/09/23 2312 08/10/23 0004 08/10/23 0404  WBC 18.4*  --  12.8*  --  11.6*  NEUTROABS 11.4*  --   --   --   --   HGB 15.8 13.6 13.5 12.9* 13.8  HCT 48.6 40.0 40.2 38.0* 41.2  MCV 95.7  --  93.7  --  93.8  PLT 406*  --  320  --  330    Basic Metabolic Panel:  Recent Labs  Lab 08/09/23 1923 08/09/23 1926 08/09/23 2112 08/09/23 2312 08/10/23 0004 08/10/23 0404  NA 142  143 144 142  --  141 142  K 3.5  3.5 3.7 3.4*  --  3.7 3.8  CL 103 105  --   --   --  104  CO2  --  28  --   --   --  24  GLUCOSE 103* 104*  --   --   --  158*  BUN 8 9  --   --   --  12  CREATININE 0.90 0.94  --  1.04  --  1.10  CALCIUM   --  9.3  --   --   --  8.9  MG  --   --   --   --   --  2.1  PHOS  --   --   --   --   --  3.0   GFR: Estimated Creatinine Clearance: 73.9 mL/min (by C-G formula  based on SCr of 1.1 mg/dL). Recent Labs  Lab 08/09/23 1926 08/09/23 2312 08/10/23 0404  WBC 18.4* 12.8* 11.6*    Liver Function Tests: No results for input(s): "AST", "ALT", "ALKPHOS", "BILITOT", "PROT", "ALBUMIN " in the last 168 hours. No results for input(s): "LIPASE", "AMYLASE" in the last 168 hours. No results for input(s): "AMMONIA" in the last 168 hours.  ABG    Component Value Date/Time   PHART 7.431 08/10/2023 0004   PCO2ART 40.9 08/10/2023 0004   PO2ART 133 (H) 08/10/2023 0004   HCO3 27.2 08/10/2023 0004   TCO2 28 08/10/2023 0004   O2SAT 99 08/10/2023 0004     Coagulation Profile: No results for input(s): "INR", "PROTIME" in the last 168 hours.  Cardiac Enzymes: No results for input(s): "CKTOTAL", "CKMB", "CKMBINDEX", "TROPONINI" in the last 168 hours.  HbA1C: Hgb A1c MFr Bld  Date/Time Value Ref Range Status  02/13/2023 08:20 PM 6.2 (H) 4.8 - 5.6 % Final    Comment:    (NOTE) Pre diabetes:          5.7%-6.4%  Diabetes:              >6.4%  Glycemic control for   <7.0% adults with diabetes   08/18/2019 08:38 AM 5.5 4.8 - 5.6 % Final    Comment:    RESULTS CONFIRMED BY MANUAL DILUTION (NOTE) Pre diabetes:          5.7%-6.4% Diabetes:              >6.4% Glycemic control for   <7.0% adults with diabetes      Critical care time:       This patient is critically ill with multiple organ system failure which requires frequent high complexity decision making, assessment, support, evaluation, and titration of therapies. This was completed through the application of advanced monitoring technologies and extensive interpretation of multiple databases. During this encounter critical care time was devoted to patient care services described in this note for 50 minutes.  Joesph Mussel, DO 08/10/23 5:36 PM [facility]  For contact information, see Amion. If no response to pager, please call PCCM consult pager. After hours, 7PM- 7AM,  please call Elink.

## 2023-08-10 NOTE — Plan of Care (Signed)
  Problem: Education: Goal: Ability to describe self-care measures that may prevent or decrease complications (Diabetes Survival Skills Education) will improve Outcome: Progressing Goal: Individualized Educational Video(s) Outcome: Progressing   Problem: Fluid Volume: Goal: Ability to maintain a balanced intake and output will improve Outcome: Progressing   Problem: Health Behavior/Discharge Planning: Goal: Ability to identify and utilize available resources and services will improve Outcome: Progressing   Problem: Nutritional: Goal: Maintenance of adequate nutrition will improve Outcome: Progressing   Problem: Skin Integrity: Goal: Risk for impaired skin integrity will decrease Outcome: Progressing   Problem: Tissue Perfusion: Goal: Adequacy of tissue perfusion will improve Outcome: Progressing

## 2023-08-10 NOTE — Progress Notes (Signed)
 Initial Nutrition Assessment  DOCUMENTATION CODES:  Non-severe (moderate) malnutrition in context of chronic illness  INTERVENTION:  If not extubated, initiate tube feeding via OGT: Osmolite 1.5 at 60 ml/h (1440 ml per day) Start at 30 and advance by 10mL q8h Prosource TF20 60 ml 1x/d Provides 2240 kcal, 110 gm protein, 1097 ml free water daily Monitor magnesium  and phosphorus daily x 3 occurrences, MD to replete as needed, as pt is at risk for refeeding syndrome given malnutrition. 100mg  of thiamine  x 5 days MVI with minerals daily  NUTRITION DIAGNOSIS:  Moderate Malnutrition related to chronic illness (COPD) as evidenced by mild fat depletion, moderate muscle depletion.  GOAL:  Patient will meet greater than or equal to 90% of their needs  MONITOR:  TF tolerance, Vent status, Labs  REASON FOR ASSESSMENT:  Ventilator    ASSESSMENT:  Pt with hx of COPD and tobacco abuse present to ED with SOB.  4/22 - presented to ED, vomited, intubated   Patient is currently intubated on ventilator support. No family present to provide a hx. On exam, pt with some muscle and fat depletions consistent with poor nutrition. Gets agitated during physical assessment.   Toxicology positive for illicit substances on admission which likely also plays a role in poor nutrition.    Discussed in rounds, hopeful to be able to extubate today. Will leave recommendations if feeds are needed.   MV: 14.3L/min Temp (24hrs), Avg:98.8 F (37.1 C), Min:97.5 F (36.4 C), Max:99.5 F (37.5 C) MAP (cuff): 106 mmHg  Admit / Current weight: 75 kg 5% weight loss noted x 6 months (10/31-4/23) not severe    Intake/Output Summary (Last 24 hours) at 08/10/2023 1556 Last data filed at 08/10/2023 1500 Gross per 24 hour  Intake 2231.11 ml  Output 390 ml  Net 1841.11 ml  Net IO Since Admission: 1,841.11 mL [08/10/23 1556]  Drains/Lines: OGT 18 Fr. UOP since admission  Nutritionally Relevant  Medications: Scheduled Meds:  insulin  aspart  0-15 Units Subcutaneous Q4H   methylPREDNISolone    120 mg Intravenous Q12H   Continuous Infusions:  azithromycin  Stopped (08/10/23 0039)   cefTRIAXone  (ROCEPHIN )  IV Stopped (08/09/23 2231)   famotidine  (PEPCID ) IV Stopped (08/10/23 1002)   norepinephrine  (LEVOPHED ) Adult infusion     PRN Meds: ondansetron   Labs Reviewed: CBG ranges from 127-160 mg/dL over the last 24 hours HgbA1c 6.2% (02/13/23)  NUTRITION - FOCUSED PHYSICAL EXAM: Flowsheet Row Most Recent Value  Orbital Region Severe depletion  Upper Arm Region Mild depletion  Thoracic and Lumbar Region Mild depletion  Buccal Region Severe depletion  Temple Region Mild depletion  Clavicle Bone Region Moderate depletion  Clavicle and Acromion Bone Region Moderate depletion  Scapular Bone Region Moderate depletion  Dorsal Hand Unable to assess  [mittens]  Patellar Region Severe depletion  Anterior Thigh Region Severe depletion  Posterior Calf Region Moderate depletion  Edema (RD Assessment) None  Hair Reviewed  Eyes Reviewed  Mouth Reviewed  Skin Reviewed  Nails Reviewed    Diet Order:   Diet Order             Diet NPO time specified  Diet effective now                   EDUCATION NEEDS:  Not appropriate for education at this time  Skin:  Skin Assessment: Reviewed RN Assessment  Last BM:  unsure  Height:  Ht Readings from Last 1 Encounters:  08/09/23 6' (1.829 m)    Weight:  Wt Readings from Last 1 Encounters:  08/09/23 75 kg    Ideal Body Weight:  80.9 kg  BMI:  Body mass index is 22.42 kg/m.  Estimated Nutritional Needs:  Kcal:  2100-2300 kcal/d Protein:  105-120 g/d Fluid:  2.2L/d    Edwena Graham, RD, LDN Registered Dietitian II Please reach out via secure chat

## 2023-08-10 NOTE — TOC Initial Note (Addendum)
 Transition of Care [facility]) - Initial/Assessment Note    Patient Details  Name: Francisco [name] MRN: [id number] Date of Birth: 1960-12-28  [facility]) CM/SW Contact:    Juliane Och, LCSW Phone Number: 08/10/2023, 11:56 AM  Clinical Narrative:                  11:56 AM [name] stepmother, [name], returned CSW call. [name] stated that her and patient's father do not have consistent contact with patient and were unsure of SNF/HH/DME history. [name] was unsure of whether or not patient resided with a roommate. [name] stated that her and spouse could not provide transportation or home support upon discharge if needed as they reside in [place]. [name] stated that her and patient's father wanted to be contacted regarding patient's care.  Expected Discharge Plan: Home/Self Care Barriers to Discharge: Continued Medical Work up   Patient Goals and CMS Choice            Expected Discharge Plan and Services       Living arrangements for the past 2 months: Single Family Home                                      Prior Living Arrangements/Services Living arrangements for the past 2 months: Single Family Home Lives with:: Roommate Patient language and need for interpreter reviewed:: Yes        Need for Family Participation in Patient Care: Yes (Comment) Care giver support system in place?: No (comment)   Criminal Activity/Legal Involvement Pertinent to Current Situation/Hospitalization: No - Comment as needed  Activities of Daily Living      Permission Sought/Granted Permission sought to share information with : Family Supports Permission granted to share information with : No (Contact information on chart)  Share Information with NAME: Haematologist     Permission granted to share info w Relationship: Stepmother  Permission granted to share info w Contact Information: [phone]  Emotional Assessment Appearance:: Appears stated  age Attitude/Demeanor/Rapport: Unable to Assess, Intubated (Following Commands or Not Following Commands) Affect (typically observed): Unable to Assess   Alcohol / Substance Use: Not Applicable Psych Involvement: No (comment)  Admission diagnosis:  COPD exacerbation (HCC) [J44.1] 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] Acute respiratory failure with hypoxia and hypercapnia (HCC) [J96.01, J96.02] 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 hypercapnic respiratory failure (HCC) [J96.01, J96.12] Patient Active Problem List   Diagnosis Date Noted   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 hypercapnic respiratory failure (HCC) 08/09/2023   Seasonal allergies 07/29/2023   TIA (transient ischemic attack) 02/14/2023   Benzodiazepine causing adverse effect in therapeutic use 02/14/2023   Stroke (HCC) 02/13/2023   Pulmonary hypertension (HCC) 02/05/2023   Fatigue 09/28/2022   Right knee pain 08/26/2022   Patient cannot afford medications 05/05/2022   Weight loss 02/19/2022   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 [place] [address] Phone: [phone] Fax: [phone]  [id number] [place], [place] - [address] AT [facility] [address] [place] [address] Phone: [phone] Fax: [phone]  [facility] [address] [place] [address] Phone: [phone] Fax: [phone]     Social Drivers of Health  (SDOH) Social History: SDOH Screenings   Food Insecurity: Patient Unable To Answer (08/10/2023)  Housing: Patient Unable To Answer (08/10/2023)  Transportation Needs: Patient Unable To Answer (08/10/2023)  Utilities: Patient Unable To Answer (08/10/2023)  Alcohol Screen: Low Risk  (02/07/2023)  Depression (PHQ2-9): Medium Risk (09/27/2022)  Tobacco Use: High Risk (07/20/2023)   SDOH Interventions: Utilities Interventions: Patient Unable to Answer   Readmission Risk Interventions     No data to display

## 2023-08-10 NOTE — Progress Notes (Signed)
 LTM EEG hooked up and running - no initial skin breakdown - push button tested - [facility] monitoring.

## 2023-08-10 NOTE — Procedures (Signed)
 Patient Name: Francisco [name]  MRN: [id number]  Epilepsy Attending: Arleene Lack  Referring Physician/Provider: Ogan, Okoronkwo U, MD  Duration: 08/10/2023 4098 to 08/11/2023 1191  Patient history: 63yo M with intermittent twitching. EEG to evaluate for seizure  Level of alertness: comatose-->awake, asleep  AEDs during EEG study: Propofol , Versed   Technical aspects: This EEG study was done with scalp electrodes positioned according to the 10-20 International system of electrode placement. Electrical activity was reviewed with band pass filter of 1-70Hz , sensitivity of 7 uV/mm, display speed of 40mm/sec with a 60Hz  notched filter applied as appropriate. EEG data were recorded continuously and digitally stored.  Video monitoring was available and reviewed as appropriate.  Description: EEG initially showed continuous generalized 3 to 6 Hz theta-delta slowing admixed with an excessive amount of 15 to 18 Hz beta activity distributed symmetrically and diffusely. Gradually as sedation was weaned EEG showed posterior dominant rhythm of 8 Hz activity of moderate voltage (25-35 uV) seen predominantly in posterior head regions, symmetric and reactive to eye opening and eye closing. Sleep was characterized by vertex waves, sleep spindles (12 to 14 Hz), maximal frontocentral region.  Hyperventilation and photic stimulation were not performed.     ABNORMALITY - Continuous slow, generalized  IMPRESSION: This study was initially suggestive of severe diffuse encephalopathy, likely related to sedation. As sedation was weaned, EEG improved and was within normal limits.  No seizures or epileptiform discharges were seen throughout the recording.  Amiria Orrison O Polina Burmaster

## 2023-08-10 NOTE — TOC CM/SW Note (Addendum)
 Transition of Care [facility]) - Inpatient Brief Assessment   Patient Details  Name: Francisco [name] MRN: [id number] Date of Birth: 1960/09/12  Transition [facility]) CM/SW Contact:    Juliane Och, LCSW Phone Number: 08/10/2023, 11:32 AM   Clinical Narrative:  11:32 AM Patient is currently attempted without family present at bedside. CSW attempted to contact patient's parents, [name] and [name], to conduct TOC initial assessment, but there were no responses. CSW left a voicemail for [employer] as [employer] was full.  Transition of Care Asessment: Insurance and Status: Insurance coverage has been reviewed Patient has primary care physician: Yes Home environment has been reviewed: Private Residence Prior level of function:: N/A Prior/Current Home Services: No current home services Social Drivers of Health Review:  (SDOH needs to be updated when possible) Readmission risk has been reviewed: Yes Transition of care needs:  (Possible TOC needs)

## 2023-08-11 ENCOUNTER — Encounter (HOSPITAL_COMMUNITY)

## 2023-08-11 ENCOUNTER — Inpatient Hospital Stay (HOSPITAL_COMMUNITY)

## 2023-08-11 DIAGNOSIS — R569 Unspecified convulsions: Secondary | ICD-10-CM | POA: Diagnosis not present

## 2023-08-11 DIAGNOSIS — J9601 Acute respiratory failure with hypoxia: Secondary | ICD-10-CM | POA: Diagnosis not present

## 2023-08-11 DIAGNOSIS — Z720 Tobacco use: Secondary | ICD-10-CM | POA: Diagnosis not present

## 2023-08-11 DIAGNOSIS — J9602 Acute respiratory failure with hypercapnia: Secondary | ICD-10-CM | POA: Diagnosis not present

## 2023-08-11 DIAGNOSIS — R739 Hyperglycemia, unspecified: Secondary | ICD-10-CM | POA: Diagnosis not present

## 2023-08-11 LAB — GLUCOSE, CAPILLARY
Glucose-Capillary: 111 mg/dL — ABNORMAL HIGH (ref 70–99)
Glucose-Capillary: 118 mg/dL — ABNORMAL HIGH (ref 70–99)
Glucose-Capillary: 121 mg/dL — ABNORMAL HIGH (ref 70–99)
Glucose-Capillary: 125 mg/dL — ABNORMAL HIGH (ref 70–99)
Glucose-Capillary: 130 mg/dL — ABNORMAL HIGH (ref 70–99)
Glucose-Capillary: 77 mg/dL (ref 70–99)

## 2023-08-11 MED ORDER — ENSURE ENLIVE PO LIQD
237.0000 mL | Freq: Two times a day (BID) | ORAL | Status: DC
  Administered 2023-08-11 – 2023-08-12 (×3): 237 mL via ORAL

## 2023-08-11 MED ORDER — HEPARIN BOLUS VIA INFUSION
4000.0000 [IU] | Freq: Once | INTRAVENOUS | Status: AC
  Administered 2023-08-11: 4000 [IU] via INTRAVENOUS
  Filled 2023-08-11: qty 4000

## 2023-08-11 MED ORDER — DILTIAZEM HCL-DEXTROSE 125-5 MG/125ML-% IV SOLN (PREMIX)
5.0000 mg/h | INTRAVENOUS | Status: DC
  Administered 2023-08-11: 5 mg/h via INTRAVENOUS
  Filled 2023-08-11: qty 125

## 2023-08-11 MED ORDER — THIAMINE MONONITRATE 100 MG PO TABS
100.0000 mg | ORAL_TABLET | Freq: Every day | ORAL | Status: DC
Start: 2023-08-11 — End: 2023-08-13
  Administered 2023-08-11 – 2023-08-12 (×2): 100 mg via ORAL
  Filled 2023-08-11 (×2): qty 1

## 2023-08-11 MED ORDER — HEPARIN (PORCINE) 25000 UT/250ML-% IV SOLN
1150.0000 [IU]/h | INTRAVENOUS | Status: DC
  Administered 2023-08-11: 1150 [IU]/h via INTRAVENOUS
  Filled 2023-08-11: qty 250

## 2023-08-11 MED ORDER — ADULT MULTIVITAMIN W/MINERALS CH: 1.0000 | ORAL_TABLET | Freq: Every day | ORAL | Status: DC

## 2023-08-11 MED ORDER — SODIUM CHLORIDE 0.9 % IV SOLN
2.0000 g | INTRAVENOUS | Status: DC
  Administered 2023-08-11 – 2023-08-12 (×2): 2 g via INTRAVENOUS
  Filled 2023-08-11 (×2): qty 20

## 2023-08-11 MED ORDER — DILTIAZEM LOAD VIA INFUSION
10.0000 mg | Freq: Once | INTRAVENOUS | Status: AC
  Administered 2023-08-11: 10 mg via INTRAVENOUS
  Filled 2023-08-11: qty 10

## 2023-08-11 MED ORDER — ADULT MULTIVITAMIN W/MINERALS CH
1.0000 | ORAL_TABLET | Freq: Every day | ORAL | Status: DC
  Administered 2023-08-12: 1 via ORAL
  Filled 2023-08-11: qty 1

## 2023-08-11 MED ORDER — METOPROLOL TARTRATE 25 MG PO TABS
25.0000 mg | ORAL_TABLET | Freq: Two times a day (BID) | ORAL | Status: DC
  Administered 2023-08-11 – 2023-08-12 (×2): 25 mg via ORAL
  Filled 2023-08-11 (×2): qty 1

## 2023-08-11 MED ORDER — PREDNISONE 20 MG PO TABS
40.0000 mg | ORAL_TABLET | Freq: Every day | ORAL | Status: DC
  Administered 2023-08-12: 40 mg via ORAL
  Filled 2023-08-11: qty 2

## 2023-08-11 MED ORDER — METOPROLOL TARTRATE 5 MG/5ML IV SOLN
5.0000 mg | INTRAVENOUS | Status: AC | PRN
Start: 2023-08-11 — End: 2023-08-11
  Administered 2023-08-11 (×2): 5 mg via INTRAVENOUS
  Filled 2023-08-11 (×2): qty 5

## 2023-08-11 MED ORDER — LISINOPRIL 20 MG PO TABS
40.0000 mg | ORAL_TABLET | Freq: Every day | ORAL | Status: DC
  Administered 2023-08-11: 40 mg via ORAL
  Filled 2023-08-11: qty 4
  Filled 2023-08-11: qty 2

## 2023-08-11 MED ORDER — AMLODIPINE BESYLATE 5 MG PO TABS
5.0000 mg | ORAL_TABLET | Freq: Every day | ORAL | Status: DC
  Administered 2023-08-11: 5 mg via ORAL
  Filled 2023-08-11: qty 1

## 2023-08-11 NOTE — Progress Notes (Signed)
 Brief Nutrition Note  Consult received to initiate enteral feeds. Protocol entered for overnight use. Pt able to be extubated this AM. Discussed in rounds, pt doing well and passed bedside swallow test. MD requests diet be entered. Will add regular diet and ensure BID as pt did meet criteria for moderate malnutrition. Will follow-up as planned.  Edwena Graham, RD, LDN Registered Dietitian II Please reach out via secure chat

## 2023-08-11 NOTE — Procedures (Signed)
 Extubation Procedure Note  Patient Details:   Name: Francisco [name] DOB: December 06, 1960 MRN: [id number]   Airway Documentation:    Vent end date: 08/11/23 Vent end time: 0908   Evaluation  O2 sats: stable throughout Complications: No apparent complications Patient did tolerate procedure well. Bilateral Breath Sounds: Clear, Diminished   Yes  Patient was extubated to a 4L [place] without any complications, dyspnea or stridor noted. Positive cuff leak prior to extubation.   Kassem Kibbe, Benny Braver 08/11/2023, 9:08 AM

## 2023-08-11 NOTE — Progress Notes (Signed)
 Sudden increase in HR, asymptomatic.  EKG s/w Afib  Start heparin  Restart PTA metoprolol  IV metoprolol  5mg  x 2 doses PRN for sustained tachycardia  Joesph Mussel, DO 08/11/23 6:01 PM [place] [facility]  For contact information, see Amion. If no response to pager, please call PCCM consult pager. After hours, 7PM- 7AM, please call Elink.

## 2023-08-11 NOTE — Progress Notes (Signed)
 PHARMACY - ANTICOAGULATION CONSULT NOTE  Pharmacy Consult for Heparin  Indication: atrial fibrillation  Allergies  Allergen Reactions   Ivp Dye [Iodinated Contrast Media] Other (See Comments)    Seizures    Tramadol Other (See Comments)    Seizures    Doxycycline  Other (See Comments)    Burning sensation to his hands.    Patient Measurements: Height: 6' (182.9 cm) Weight: 75 kg (165 lb 5.5 oz) IBW/kg (Calculated) : 77.6 HEPARIN  DW (KG): 75  Vital Signs: Temp: 98.1 F (36.7 C) (04/24 1521) Temp Source: Oral (04/24 1521) BP: 171/99 (04/24 1518) Pulse Rate: 88 (04/24 1100)  Labs: Recent Labs    08/09/23 1926 08/09/23 2112 08/09/23 2312 08/10/23 0004 08/10/23 0404  HGB 15.8   < > 13.5 12.9* 13.8  HCT 48.6   < > 40.2 38.0* 41.2  PLT 406*  --  320  --  330  CREATININE 0.94  --  1.04  --  1.10   < > = values in this interval not displayed.    Estimated Creatinine Clearance: 73.9 mL/min (by C-G formula based on SCr of 1.1 mg/dL).   Medical History: Past Medical History:  Diagnosis Date   CHF (congestive heart failure) (HCC)    COPD (chronic obstructive pulmonary disease) (HCC)    COPD with acute exacerbation (HCC) 10/21/2021   Coronary artery disease    a. s/p prior PCI.   Hepatitis-C    Hypertension    IV drug abuse (HCC)    a. 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   Medically noncompliant    MI (myocardial infarction) (HCC)    Tobacco abuse     Medications:  Medications Prior to Admission  Medication Sig Dispense Refill Last Dose/Taking   albuterol  (PROVENTIL ) (2.5 MG/3ML) 0.083% nebulizer solution Use 1 vial (2.5 mg total) by nebulization every 6 (six) hours as needed for wheezing or shortness of breath. 300 mL 2 08/09/2023   albuterol  (VENTOLIN  HFA) 108 (90 Base) MCG/ACT inhaler Inhale 2 puffs into the lungs every 4 (four) hours as needed for wheezing or shortness of breath. (Patient taking differently: Inhale 2 puffs into the lungs See admin  instructions. Inhale 2 puffs into the lungs every 1-2 hours as needed for shortness of breath or wheezing) 6.7 g 2 08/09/2023   amLODipine  (NORVASC ) 5 MG tablet Take 1 tablet (5 mg total) by mouth daily. 30 tablet 2 08/09/2023   aspirin  EC 81 MG tablet Take 1 tablet (81 mg total) by mouth daily. Swallow whole. 30 tablet 0 08/09/2023   atorvastatin  (LIPITOR ) 40 MG tablet Take 1 tablet (40 mg total) by mouth daily. 30 tablet 2 08/09/2023   fluticasone  (FLONASE ) 50 MCG/ACT nasal spray Place 1 spray into both nostrils daily. (Patient taking differently: Place 1 spray into both nostrils 2 (two) times daily as needed for allergies or rhinitis.) 16 g 0 Past Week   Fluticasone -Umeclidin-Vilant (TRELEGY ELLIPTA ) 100-62.5-25 MCG/ACT AEPB Inhale 1 puff into the lungs daily. (Patient taking differently: Inhale 2 puffs into the lungs 2 (two) times daily.) 60 each 2 08/09/2023   lisinopril  (ZESTRIL ) 40 MG tablet Take 1/2 tablet (20 mg total) by mouth daily. 90 tablet 3 08/09/2023   loratadine  (CLARITIN ) 10 MG tablet Take 1 tablet (10 mg total) by mouth daily. 100 tablet 0 08/09/2023   nicotine  (NICODERM CQ  - DOSED IN MG/24 HOURS) 14 mg/24hr patch Place 1 patch (14 mg total) onto the skin daily. 28 patch 1 08/11/2023 at 10:43 AM   nitroGLYCERIN  (NITROSTAT ) 0.4 MG  SL tablet Place 1 tablet (0.4 mg total) under the tongue every 5 (five) minutes x 3 doses as needed for chest pain. 25 tablet 2 Unknown   lovastatin  (MEVACOR ) 20 MG tablet Take 1 tablet (20 mg total) by mouth daily. (Patient not taking: Reported on 08/11/2023) 90 tablet 3 Not Taking   methylPREDNISolone  (MEDROL  DOSEPAK) 4 MG TBPK tablet Take 6 tablets (60 mg total) by mouth on day 1, then 5 tablets (50 mg total) on day 2, then 4 tablets (40 mg total) on day 3, then 3 tablets (30 mg total) on day 4, then 2 tablets (20 mg total) on day 5, and then 1 tablet (10 mg total) on day 6. (Patient not taking: Reported on 08/11/2023) 21 tablet 0 Not Taking   metoprolol  tartrate  (LOPRESSOR ) 25 MG tablet Take 1 tablet (25 mg total) by mouth 2 (two) times daily. (Patient not taking: Reported on 08/11/2023)   Not Taking    Assessment: 63 y.o. M presents with COPD exacerbation. To begin heparin  for afib. No AC PTA. CBC stable. Pt on lovenox  40mg  q24h -last dose 4/23 1845.   Goal of Therapy:  Heparin  level 0.3-0.7 units/ml Monitor platelets by anticoagulation protocol: Yes   Plan:  Heparin  IV bolus 4000 units Heparin  gtt at 1150 units/hr Will f/u heparin  level in 6 hours Daily heparin  level and CBC  Enrigue Harvard, PharmD, BCPS Please see amion for complete clinical pharmacist phone list 08/11/2023,5:59 PM

## 2023-08-11 NOTE — Progress Notes (Addendum)
 eLink Physician-Brief Progress Note Patient Name: Francisco [name] DOB: October 24, 1960 MRN: [id number]   Date of Service  08/11/2023  HPI/Events of Note  Patient went into atrial fib with RVR.  eICU Interventions  Will order Cardizem  gtt and transfer to Step Down Unit.        Lacora Folmer U Jesse Nosbisch 08/11/2023, 7:38 PM

## 2023-08-11 NOTE — TOC Progression Note (Signed)
 Transition of Care [facility]) - Progression Note    Patient Details  Name: Francisco [name] MRN: [id number] Date of Birth: Oct 24, 1960  Transition of Care [facility] CM/SW Contact  Juliane Och, LCSW Phone Number: 08/11/2023, 12:39 PM  Clinical Narrative:     12:39 PM Per progressions, patient was recently extubated.   Expected Discharge Plan: Home/Self Care Barriers to Discharge: Continued Medical Work up  Expected Discharge Plan and Services       Living arrangements for the past 2 months: Single Family Home                                       Social Determinants of Health (SDOH) Interventions SDOH Screenings   Food Insecurity: Patient Unable To Answer (08/10/2023)  Housing: Patient Unable To Answer (08/10/2023)  Transportation Needs: Patient Unable To Answer (08/10/2023)  Utilities: Patient Unable To Answer (08/10/2023)  Alcohol Screen: Low Risk  (02/07/2023)  Depression (PHQ2-9): Medium Risk (09/27/2022)  Tobacco Use: High Risk (07/20/2023)    Readmission Risk Interventions     No data to display

## 2023-08-11 NOTE — Progress Notes (Signed)
 NAME:  Francisco [name], MRN:  [id number], DOB:  1961/01/30, LOS: 2 ADMISSION DATE:  08/09/2023, CONSULTATION DATE:  08/09/2023 REFERRING MD:  Sueellen Emery, MD, CHIEF COMPLAINT:  COPD exacerbation   History of Present Illness:  63 y/o male with PMH for COPD and Tobacco abuse disorder who has had multiple ED visits for same in the last 2 months.  He has been seen by PCP as well who ordered PFTs noting his ED visits, hyperinflated lungs on cxr and his continued smoking.  Unfortunately he was BIB EMS tonight for worsening SOB.  He refused BiPAP in ED and due to worsening SOB he was intubated.  He vomited in CT and may have aspirated.  Pertinent  Medical History  COPD Tobacco abuse disorder  Significant Hospital Events: Including procedures, antibiotic start and stop dates in addition to other pertinent events   4/22: intubated in ED for COPD exacerbation  Interim History / Subjective:  This morning denies complaints, wants to be extubated.  Objective   Blood pressure 134/87, pulse 74, temperature 97.6 F (36.4 C), temperature source Axillary, resp. rate 11, height 6' (1.829 m), weight 75 kg, SpO2 97%.    Vent Mode: CPAP;PSV FiO2 (%):  [30 %] 30 % Set Rate:  [20 bmp-24 bmp] 20 bmp Vt Set:  [620 mL] 620 mL PEEP:  [5 cmH20] 5 cmH20 Pressure Support:  [5 cmH20] 5 cmH20 Plateau Pressure:  [20 cmH20-24 cmH20] 20 cmH20   Intake/Output Summary (Last 24 hours) at 08/11/2023 0903 Last data filed at 08/11/2023 0300 Gross per 24 hour  Intake 1678.04 ml  Output 440 ml  Net 1238.04 ml   Filed Weights   08/09/23 1928  Weight: 75 kg    Examination: General: critically ill appearing man lying in bed awake, sitting up HENT: [place]/AT, ETT, EEG leads Lungs: breathing comfortably on 5/5, CTAB Cardiovascular: S1S2, RRR Abdomen: soft, NT Extremities: no edema Neuro:  RASS 0, nodding and writing to communicate, moving extremities.   BG 120-160s Trach aspirate: many PMN, GNR   Resolved Hospital  Problem list   N/a  Assessment & Plan:  Acute hypoxic and hypercapnic respiratory failure Acute COPD exacerbation, worry allergies may have triggered this vs pneumonia -switch steroids to prednisone  40mg  daily; plan for 5 days total as long as he continues to improve -yupelri , brovana ; can switch back to DPI at discharge -follow trach aspirate culture -con't ceftriaxone  (7 days), azithromycin  (5 days) -SAT & SBT; passed, extubated to [place] -pulmonary hygiene  Tobacco abuse disorder -nicotine  replacement therapy, recommend cessation  Concern for seizures at presentation-- physical exam today not concerning. Not confirmed.  -d/c EEG  Hyperglycemia, prediabetes, A1c 6.2 -SSI PRN -goal BG 140-180  At risk for malnutrition -passed swallow, reg diet  H/o HTN -hold metoprolol  -start lisinopril  and amlodipine  -labetalol  PRN  HLD -con't PTA stat  Stable to transfer out of ICU today. TRH to assume care tomorrow. PCCM will be available as needed.    Best Practice (right click and "Reselect all SmartList Selections" daily)   Diet/type: Regular consistency (see orders) DVT prophylaxis LMWH Pressure ulcer(s): N/A GI prophylaxis: N/A Lines: N/A Foley:  N/A Code Status:  full code   Labs   CBC: Recent Labs  Lab 08/09/23 1926 08/09/23 2112 08/09/23 2312 08/10/23 0004 08/10/23 0404  WBC 18.4*  --  12.8*  --  11.6*  NEUTROABS 11.4*  --   --   --   --   HGB 15.8 13.6 13.5 12.9* 13.8  HCT 48.6  40.0 40.2 38.0* 41.2  MCV 95.7  --  93.7  --  93.8  PLT 406*  --  320  --  330    Basic Metabolic Panel: Recent Labs  Lab 08/09/23 1923 08/09/23 1926 08/09/23 2112 08/09/23 2312 08/10/23 0004 08/10/23 0404  NA 142  143 144 142  --  141 142  K 3.5  3.5 3.7 3.4*  --  3.7 3.8  CL 103 105  --   --   --  104  CO2  --  28  --   --   --  24  GLUCOSE 103* 104*  --   --   --  158*  BUN 8 9  --   --   --  12  CREATININE 0.90 0.94  --  1.04  --  1.10  CALCIUM   --  9.3  --   --    --  8.9  MG  --   --   --   --   --  2.1  PHOS  --   --   --   --   --  3.0   GFR: Estimated Creatinine Clearance: 73.9 mL/min (by C-G formula based on SCr of 1.1 mg/dL). Recent Labs  Lab 08/09/23 1926 08/09/23 2312 08/10/23 0404  WBC 18.4* 12.8* 11.6*       Critical care time:       This patient is critically ill with multiple organ system failure which requires frequent high complexity decision making, assessment, support, evaluation, and titration of therapies. This was completed through the application of advanced monitoring technologies and extensive interpretation of multiple databases. During this encounter critical care time was devoted to patient care services described in this note for 45 minutes.  Joesph Mussel, DO 08/11/23 5:05 PM [facility]  For contact information, see Amion. If no response to pager, please call PCCM consult pager. After hours, 7PM- 7AM, please call Elink.

## 2023-08-11 NOTE — Progress Notes (Signed)
 LTM EEG discontinued - no skin breakdown at [facility].

## 2023-08-11 NOTE — Procedures (Addendum)
 Patient Name: Francisco [name]  MRN: [id number]  Epilepsy Attending: Arleene Lack  Referring Physician/Provider: Ogan, Okoronkwo U, MD  Duration: 08/11/2023 0137 to 08/11/2023 1058   Patient history: 62yo M with intermittent twitching. EEG to evaluate for seizure   Level of alertness: awake, asleep   AEDs during EEG study: None   Technical aspects: This EEG study was done with scalp electrodes positioned according to the 10-20 International system of electrode placement. Electrical activity was reviewed with band pass filter of 1-70Hz , sensitivity of 7 uV/mm, display speed of 27mm/sec with a 60Hz  notched filter applied as appropriate. EEG data were recorded continuously and digitally stored.  Video monitoring was available and reviewed as appropriate.   Description: EEG showed posterior dominant rhythm of 8 Hz activity of moderate voltage (25-35 uV) seen predominantly in posterior head regions, symmetric and reactive to eye opening and eye closing. Sleep was characterized by vertex waves, sleep spindles (12 to 14 Hz), maximal frontocentral region. Hyperventilation and photic stimulation were not performed.     IMPRESSION: This study is within normal limits. No seizures or epileptiform discharges were seen throughout the recording.   [place] O Rollie Hynek

## 2023-08-12 ENCOUNTER — Other Ambulatory Visit (HOSPITAL_COMMUNITY)

## 2023-08-12 ENCOUNTER — Other Ambulatory Visit (HOSPITAL_COMMUNITY): Payer: Self-pay

## 2023-08-12 DIAGNOSIS — E440 Moderate protein-calorie malnutrition: Secondary | ICD-10-CM | POA: Diagnosis not present

## 2023-08-12 DIAGNOSIS — I10 Essential (primary) hypertension: Secondary | ICD-10-CM | POA: Diagnosis not present

## 2023-08-12 DIAGNOSIS — I4891 Unspecified atrial fibrillation: Secondary | ICD-10-CM

## 2023-08-12 DIAGNOSIS — J9612 Chronic respiratory failure with hypercapnia: Secondary | ICD-10-CM

## 2023-08-12 DIAGNOSIS — J9601 Acute respiratory failure with hypoxia: Secondary | ICD-10-CM | POA: Diagnosis not present

## 2023-08-12 LAB — CBC
HCT: 44.3 % (ref 39.0–52.0)
Hemoglobin: 14 g/dL (ref 13.0–17.0)
MCH: 30.8 pg (ref 26.0–34.0)
MCHC: 31.6 g/dL (ref 30.0–36.0)
MCV: 97.4 fL (ref 80.0–100.0)
Platelets: 331 10*3/uL (ref 150–400)
RBC: 4.55 MIL/uL (ref 4.22–5.81)
RDW: 14.4 % (ref 11.5–15.5)
WBC: 13.4 10*3/uL — ABNORMAL HIGH (ref 4.0–10.5)
nRBC: 0 % (ref 0.0–0.2)

## 2023-08-12 LAB — CULTURE, RESPIRATORY W GRAM STAIN

## 2023-08-12 LAB — GLUCOSE, CAPILLARY
Glucose-Capillary: 112 mg/dL — ABNORMAL HIGH (ref 70–99)
Glucose-Capillary: 83 mg/dL (ref 70–99)
Glucose-Capillary: 129 mg/dL — ABNORMAL HIGH (ref 70–99)
Glucose-Capillary: 116 mg/dL — ABNORMAL HIGH (ref 70–99)
Glucose-Capillary: 217 mg/dL — ABNORMAL HIGH (ref 70–99)
Glucose-Capillary: 148 mg/dL — ABNORMAL HIGH (ref 70–99)

## 2023-08-12 LAB — BASIC METABOLIC PANEL WITH GFR
Anion gap: 9 (ref 5–15)
BUN: 37 mg/dL — ABNORMAL HIGH (ref 8–23)
CO2: 27 mmol/L (ref 22–32)
Calcium: 8.8 mg/dL — ABNORMAL LOW (ref 8.9–10.3)
Chloride: 105 mmol/L (ref 98–111)
Creatinine, Ser: 1.08 mg/dL (ref 0.61–1.24)
GFR, Estimated: >60 mL/min (ref >60)
Glucose, Bld: 97 mg/dL (ref 70–99)
Potassium: 4.3 mmol/L (ref 3.5–5.1)
Sodium: 141 mmol/L (ref 135–145)

## 2023-08-12 LAB — HEPARIN LEVEL (UNFRACTIONATED): Heparin Unfractionated: 0.64 [IU]/mL (ref 0.30–0.70)

## 2023-08-12 MED ORDER — SENNOSIDES-DOCUSATE SODIUM 8.6-50 MG PO TABS: 2.0000 | ORAL_TABLET | Freq: Two times a day (BID) | ORAL | Status: DC

## 2023-08-12 MED ORDER — OXYCODONE HCL 5 MG PO TABS
5.0000 mg | ORAL_TABLET | Freq: Once | ORAL | Status: AC
  Administered 2023-08-12: 5 mg via ORAL
  Filled 2023-08-12: qty 1

## 2023-08-12 MED ORDER — POLYETHYLENE GLYCOL 3350 17 G PO PACK: 17.0000 g | PACK | Freq: Every day | ORAL | Status: DC

## 2023-08-12 MED ORDER — LEVALBUTEROL HCL 0.63 MG/3ML IN NEBU: 0.6300 mg | INHALATION_SOLUTION | Freq: Four times a day (QID) | RESPIRATORY_TRACT | Status: DC | PRN

## 2023-08-12 MED ORDER — SODIUM CHLORIDE 0.9 % IV BOLUS
500.0000 mL | Freq: Once | INTRAVENOUS | Status: AC
  Administered 2023-08-12: 500 mL via INTRAVENOUS

## 2023-08-12 MED ORDER — DILTIAZEM HCL 30 MG PO TABS
30.0000 mg | ORAL_TABLET | Freq: Three times a day (TID) | ORAL | Status: DC
  Administered 2023-08-12 – 2023-08-13 (×3): 30 mg via ORAL
  Filled 2023-08-12 (×3): qty 1

## 2023-08-12 MED ORDER — GUAIFENESIN-DM 100-10 MG/5ML PO SYRP
10.0000 mL | ORAL_SOLUTION | Freq: Four times a day (QID) | ORAL | Status: DC | PRN
Start: 2023-08-12 — End: 2023-08-13
  Administered 2023-08-12 – 2023-08-13 (×2): 10 mL via ORAL
  Filled 2023-08-12 (×2): qty 10

## 2023-08-12 MED ORDER — IPRATROPIUM BROMIDE 0.02 % IN SOLN: 0.5000 mg | Freq: Four times a day (QID) | RESPIRATORY_TRACT | Status: DC | PRN

## 2023-08-12 MED ORDER — LISINOPRIL 20 MG PO TABS: 20.0000 mg | ORAL_TABLET | Freq: Every day | ORAL | Status: DC

## 2023-08-12 MED ORDER — OXYCODONE HCL 5 MG PO TABS
5.0000 mg | ORAL_TABLET | Freq: Four times a day (QID) | ORAL | Status: DC | PRN
  Administered 2023-08-12 – 2023-08-13 (×3): 5 mg via ORAL
  Filled 2023-08-12 (×3): qty 1

## 2023-08-12 MED ORDER — ACETAMINOPHEN 500 MG PO TABS
1000.0000 mg | ORAL_TABLET | Freq: Four times a day (QID) | ORAL | Status: DC | PRN
  Administered 2023-08-12: 1000 mg via ORAL
  Filled 2023-08-12: qty 2

## 2023-08-12 MED ORDER — AZITHROMYCIN 500 MG PO TABS
500.0000 mg | ORAL_TABLET | Freq: Every evening | ORAL | Status: DC
  Administered 2023-08-12: 500 mg via ORAL
  Filled 2023-08-12: qty 1

## 2023-08-12 MED ORDER — POLYETHYLENE GLYCOL 3350 17 G PO PACK: 17.0000 g | PACK | Freq: Every day | ORAL | Status: DC | PRN

## 2023-08-12 MED ORDER — BUDESON-GLYCOPYRROL-FORMOTEROL 160-9-4.8 MCG/ACT IN AERO
2.0000 | INHALATION_SPRAY | Freq: Two times a day (BID) | RESPIRATORY_TRACT | Status: DC
  Administered 2023-08-12 (×2): 2 via RESPIRATORY_TRACT
  Filled 2023-08-12: qty 5.9

## 2023-08-12 MED ORDER — METOPROLOL TARTRATE 12.5 MG HALF TABLET
12.5000 mg | ORAL_TABLET | Freq: Two times a day (BID) | ORAL | Status: DC
  Administered 2023-08-12: 12.5 mg via ORAL
  Filled 2023-08-12: qty 1

## 2023-08-12 MED ORDER — INSULIN ASPART 100 UNIT/ML IJ SOLN
0.0000 [IU] | Freq: Three times a day (TID) | INTRAMUSCULAR | Status: DC
Start: 2023-08-12 — End: 2023-08-13
  Administered 2023-08-12: 1 [IU] via SUBCUTANEOUS

## 2023-08-12 MED ORDER — DILTIAZEM HCL 30 MG PO TABS
60.0000 mg | ORAL_TABLET | Freq: Three times a day (TID) | ORAL | Status: DC
  Administered 2023-08-12: 60 mg via ORAL
  Filled 2023-08-12: qty 2

## 2023-08-12 MED ORDER — APIXABAN 5 MG PO TABS
5.0000 mg | ORAL_TABLET | Freq: Two times a day (BID) | ORAL | Status: DC
  Administered 2023-08-12 (×2): 5 mg via ORAL
  Filled 2023-08-12 (×2): qty 1

## 2023-08-12 NOTE — Plan of Care (Signed)

## 2023-08-12 NOTE — Progress Notes (Addendum)
 [facility] PROGRESS NOTE   Francisco [name] AVW:[id number] DOB: 18-Mar-1961 DOA: 08/09/2023  PCP: Lanney Pitts, DO  Brief History: 63 year old Caucasian male with past medical history of COPD, current tobacco abuse, coronary artery disease, essential hypertension who was in his usual state of health till a few days prior to admission when he started getting short of breath which worsened on the day of admission.  He was evaluated in the emergency department.  Had to be intubated for respiratory failure.  Was admitted to the ICU for pneumonia.  Also developed atrial fibrillation with RVR.  There was also concern for seizure activity though EEG has been negative.  Transferred to the floor.  Consultants: Critical care medicine  Procedures: Echocardiogram is pending    Subjective/Interval History: Patient denies any chest pain.  Shortness of breath is improved.  No nausea vomiting.  No previous history of atrial fibrillation.  Denies any alcohol use.    Assessment/Plan:  Acute hypoxic and hypercapnic respiratory failure/COPD with acute exacerbation/community-acquired pneumonia/H. influenzae Patient was intubated in the ICU.  Successfully extubated. Patient remains on ceftriaxone  and azithromycin . Noted to be on inhaled steroids. Also noted to be on prednisone . Respiratory status is improving. Still noted to be on 4 to 5 L of oxygen .  Hopefully can be weaned off.  Does not use oxygen  at home.  Atrial fibrillation with RVR Went into atrial fibrillation with RVR on 4/24.  Likely triggered by his respiratory issues.  Patient denies any history of atrial fibrillation previously that he does have history of coronary artery disease. Placed on Cardizem  infusion.  Remains in A-fib the heart rate is better.  Transition to oral Cardizem .  Patient also noted to be on metoprolol  which is his usual home medication. Will check TSH and echocardiogram.  Not established with any cardiologist in  town. Continue to monitor on telemetry. He does report a history of mini stroke previously.  His CHADS2 vascular score is 4.  Anticoagulation has been initiated.  Will be transition to Eliquis .  Patient told to watch for signs of bleeding.  Agreeable to anticoagulation.  Essential hypertension Blood pressure is reasonably well-controlled.  Noted to be on metoprolol  and lisinopril .  Now on Cardizem .  Monitor for low blood pressures.  Will cut back on the dose of his lisinopril .  Concern for seizure at presentation No further seizure activity noted.  EEG was unremarkable.  Tobacco abuse disorder Patient counseled.  Moderate protein calorie malnutrition Nutrition Problem: Moderate Malnutrition Etiology: chronic illness (COPD)   DVT Prophylaxis: On apixaban  now Code Status: Full code Family Communication: Discussed with patient Disposition Plan: Hopefully return home when improved     Medications: Scheduled:  budeson-glycopyrrolate -formoterol   2 puff Inhalation BID   Chlorhexidine  Gluconate Cloth  6 each Topical Daily   diltiazem   60 mg Oral Q8H   feeding supplement  237 mL Oral BID BM   insulin  aspart  0-15 Units Subcutaneous Q4H   [START ON 08/13/2023] lisinopril   20 mg Oral Daily   metoprolol  tartrate  25 mg Oral BID   multivitamin with minerals  1 tablet Oral Daily   nicotine   14 mg Transdermal Daily   predniSONE   40 mg Oral Q breakfast   thiamine   100 mg Oral Daily   Continuous:  azithromycin  Stopped (08/11/23 2308)   cefTRIAXone  (ROCEPHIN )  IV Stopped (08/11/23 2246)   diltiazem  (CARDIZEM ) infusion 5 mg/hr (08/12/23 0718)   heparin  1,150 Units/hr (08/12/23 0718)   PRN:acetaminophen , guaiFENesin -dextromethorphan , levalbuterol  **AND** ipratropium, labetalol , ondansetron  (ZOFRAN )  IV, mouth rinse, oxyCODONE   Antibiotics: Anti-infectives (From admission, onward)    Start     Dose/Rate Route Frequency Ordered Stop   08/11/23 2130  cefTRIAXone  (ROCEPHIN ) 2 g in sodium  chloride 0.9 % 100 mL IVPB        2 g 200 mL/hr over 30 Minutes Intravenous Every 24 hours 08/11/23 0908     08/09/23 2145  azithromycin  (ZITHROMAX ) 500 mg in sodium chloride  0.9 % 250 mL IVPB        500 mg 250 mL/hr over 60 Minutes Intravenous Every 24 hours 08/09/23 2130     08/09/23 2130  cefTRIAXone  (ROCEPHIN ) 1 g in sodium chloride  0.9 % 100 mL IVPB  Status:  Discontinued        1 g 200 mL/hr over 30 Minutes Intravenous Every 24 hours 08/09/23 2129 08/11/23 0908       Objective:  Vital Signs  Vitals:   08/11/23 2354 08/12/23 0030 08/12/23 0608 08/12/23 0743  BP:  110/78 115/87 127/83  Pulse:  (!) 101 82 90  Resp:  16  19  Temp: 98.2 F (36.8 C) 98.2 F (36.8 C) 98.4 F (36.9 C) 98.4 F (36.9 C)  TempSrc: Oral Oral Oral Oral  SpO2:  99% 100% 100%  Weight:      Height:        Intake/Output Summary (Last 24 hours) at 08/12/2023 0858 Last data filed at 08/12/2023 0718 Gross per 24 hour  Intake 598.14 ml  Output 670 ml  Net -71.86 ml   Filed Weights   08/09/23 1928  Weight: 75 kg    General appearance: Awake alert.  In no distress Resp: Coarse sounds bilaterally.  Normal effort at rest.  Few crackles in the bases.  No wheezing or rhonchi. Cardio: S1-S2 is irregularly irregular GI: Abdomen is soft.  Nontender nondistended.  Bowel sounds are present normal.  No masses organomegaly Extremities: No edema.  Full range of motion of lower extremities. Neurologic: Alert and oriented x3.  No focal neurological deficits.    Lab Results:  Data Reviewed: I have personally reviewed following labs and reports of the imaging studies  CBC: Recent Labs  Lab 08/09/23 1926 08/09/23 2112 08/09/23 2312 08/10/23 0004 08/10/23 0404 08/12/23 0642  WBC 18.4*  --  12.8*  --  11.6* 13.4*  NEUTROABS 11.4*  --   --   --   --   --   HGB 15.8 13.6 13.5 12.9* 13.8 14.0  HCT 48.6 40.0 40.2 38.0* 41.2 44.3  MCV 95.7  --  93.7  --  93.8 97.4  PLT 406*  --  320  --  330 331     Basic Metabolic Panel: Recent Labs  Lab 08/09/23 1923 08/09/23 1926 08/09/23 2112 08/09/23 2312 08/10/23 0004 08/10/23 0404 08/12/23 0642  NA 142  143 144 142  --  141 142 141  K 3.5  3.5 3.7 3.4*  --  3.7 3.8 4.3  CL 103 105  --   --   --  104 105  CO2  --  28  --   --   --  24 27  GLUCOSE 103* 104*  --   --   --  158* 97  BUN 8 9  --   --   --  12 37*  CREATININE 0.90 0.94  --  1.04  --  1.10 1.08  CALCIUM   --  9.3  --   --   --  8.9 8.8*  MG  --   --   --   --   --  2.1  --   PHOS  --   --   --   --   --  3.0  --     GFR: Estimated Creatinine Clearance: 75.2 mL/min (by C-G formula based on SCr of 1.08 mg/dL).  CBG: Recent Labs  Lab 08/11/23 1520 08/11/23 1941 08/11/23 2353 08/12/23 0409 08/12/23 0748  GLUCAP 130* 118* 121* 112* 83    Lipid Profile: Recent Labs    08/10/23 0404  TRIG 44     Recent Results (from the past 240 hours)  Resp panel by RT-PCR (RSV, Flu A&B, Covid) Anterior Nasal Swab     Status: None   Collection Time: 08/09/23  8:49 PM   Specimen: Anterior Nasal Swab  Result Value Ref Range Status   SARS Coronavirus 2 by RT PCR NEGATIVE NEGATIVE Final   Influenza A by PCR NEGATIVE NEGATIVE Final   Influenza B by PCR NEGATIVE NEGATIVE Final    Comment: (NOTE) The Xpert Xpress SARS-CoV-2/FLU/RSV plus assay is intended as an aid in the diagnosis of influenza from Nasopharyngeal swab specimens and should not be used as a sole basis for treatment. Nasal washings and aspirates are unacceptable for Xpert Xpress SARS-CoV-2/FLU/RSV testing.  Fact Sheet for Patients: BloggerCourse.com  Fact Sheet for Healthcare Providers: SeriousBroker.it  This test is not yet approved or cleared by the [place]  FDA and has been authorized for detection and/or diagnosis of SARS-CoV-2 by FDA under an Emergency Use Authorization (EUA). This EUA will remain in effect (meaning this test can be used) for  the duration of the COVID-19 declaration under Section 564(b)(1) of the Act, 21 U.S.C. section 360bbb-3(b)(1), unless the authorization is terminated or revoked.     Resp Syncytial Virus by PCR NEGATIVE NEGATIVE Final    Comment: (NOTE) Fact Sheet for Patients: BloggerCourse.com  Fact Sheet for Healthcare Providers: SeriousBroker.it  This test is not yet approved or cleared by the [place]  FDA and has been authorized for detection and/or diagnosis of SARS-CoV-2 by FDA under an Emergency Use Authorization (EUA). This EUA will remain in effect (meaning this test can be used) for the duration of the COVID-19 declaration under Section 564(b)(1) of the Act, 21 U.S.C. section 360bbb-3(b)(1), unless the authorization is terminated or revoked.  Performed at [facility], [address], [place], [place] [address]   MRSA Next Gen by PCR, Nasal     Status: None   Collection Time: 08/09/23  9:44 PM   Specimen: Nasal Mucosa; Nasal Swab  Result Value Ref Range Status   MRSA by PCR Next Gen NOT DETECTED NOT DETECTED Final    Comment: (NOTE) The GeneXpert MRSA Assay (FDA approved for NASAL specimens only), is one component of a comprehensive MRSA colonization surveillance program. It is not intended to diagnose MRSA infection nor to guide or monitor treatment for MRSA infections. Test performance is not FDA approved in patients less than 14 years old. Performed at [facility], [address], [place], [place] [address]   Culture, Respiratory w Gram Stain     Status: None (Preliminary result)   Collection Time: 08/09/23 11:40 PM   Specimen: Tracheal Aspirate  Result Value Ref Range Status   Specimen Description TRACHEAL ASPIRATE  Final   Special Requests NONE  Final   Gram Stain   Final    FEW WBC PRESENT, PREDOMINANTLY PMN MODERATE GRAM NEGATIVE RODS    Culture   Final    ABUNDANT HAEMOPHILUS INFLUENZAE BETA  LACTAMASE POSITIVE Performed at  [facility], [address], [place], [place] [address]    Report Status PENDING  Incomplete  Culture, Respiratory w Gram Stain     Status: None (Preliminary result)   Collection Time: 08/10/23  5:00 PM   Specimen: Tracheal Aspirate; Respiratory  Result Value Ref Range Status   Specimen Description TRACHEAL ASPIRATE  Final   Special Requests NONE  Final   Gram Stain   Final    ABUNDANT WBC PRESENT, PREDOMINANTLY PMN RARE GRAM NEGATIVE RODS    Culture   Final    CULTURE REINCUBATED FOR BETTER GROWTH Performed at [facility], [address], [place], [place] [address]    Report Status PENDING  Incomplete      Radiology Studies: No results found.     LOS: 3 days   Kamila Broda [employer] on [email or web address]  08/12/2023, 8:58 AM

## 2023-08-12 NOTE — Progress Notes (Signed)
 Pt c/o not feeling well. Pt stated he felt dizzy and sweaty. Pt was sitting on the bed for a few minutes after ambulating to the bathroom for a BM. Pt found to be hypotensive 88/62 and HR afib in the low 60s. CBG=129. Order obtained for 50ml NS bolus. RRT called for additional assessment. Pt soon feeling better - BP 109/77 HR 74. Will continue to monitor

## 2023-08-12 NOTE — Discharge Instructions (Signed)
 Information on my medicine - ELIQUIS (apixaban)  This medication education was reviewed with me or my healthcare representative as part of my discharge preparation.     Why was Eliquis prescribed for you? Eliquis was prescribed for you to reduce the risk of a blood clot forming that can cause a stroke if you have a medical condition called atrial fibrillation (a type of irregular heartbeat).  What do You need to know about Eliquis ? Take your Eliquis TWICE DAILY - one tablet in the morning and one tablet in the evening with or without food. If you have difficulty swallowing the tablet whole please discuss with your pharmacist how to take the medication safely.  Take Eliquis exactly as prescribed by your doctor and DO NOT stop taking Eliquis without talking to the doctor who prescribed the medication.  Stopping may increase your risk of developing a stroke.  Refill your prescription before you run out.  After discharge, you should have regular check-up appointments with your healthcare provider that is prescribing your Eliquis.  In the future your dose may need to be changed if your kidney function or weight changes by a significant amount or as you get older.  What do you do if you miss a dose? If you miss a dose, take it as soon as you remember on the same day and resume taking twice daily.  Do not take more than one dose of ELIQUIS at the same time to make up a missed dose.  Important Safety Information A possible side effect of Eliquis is bleeding. You should call your healthcare provider right away if you experience any of the following: Bleeding from an injury or your nose that does not stop. Unusual colored urine (red or dark brown) or unusual colored stools (red or black). Unusual bruising for unknown reasons. A serious fall or if you hit your head (even if there is no bleeding).  Some medicines may interact with Eliquis and might increase your risk of bleeding or clotting  while on Eliquis. To help avoid this, consult your healthcare provider or pharmacist prior to using any new prescription or non-prescription medications, including herbals, vitamins, non-steroidal anti-inflammatory drugs (NSAIDs) and supplements.  This website has more information on Eliquis (apixaban): http://www.eliquis.com/eliquis/home =======================================  Atrial Fibrillation    Atrial fibrillation is a type of heartbeat that is irregular or fast. If you have this condition, your heart beats without any order. This makes it hard for your heart to pump blood in a normal way. Atrial fibrillation may come and go, or it may become a long-lasting problem. If this condition is not treated, it can put you at higher risk for stroke, heart failure, and other heart problems.  What are the causes? This condition may be caused by diseases that damage the heart. They include: High blood pressure. Heart failure. Heart valve disease. Heart surgery. Other causes include: Diabetes. Thyroid disease. Being overweight. Kidney disease. Sometimes the cause is not known.  What increases the risk? You are more likely to develop this condition if: You are older. You smoke. You exercise often and very hard. You have a family history of this condition. You are a man. You use drugs. You drink a lot of alcohol. You have lung conditions, such as emphysema, pneumonia, or COPD. You have sleep apnea.  What are the signs or symptoms? Common symptoms of this condition include: A feeling that your heart is beating very fast. Chest pain or discomfort. Feeling short of breath. Suddenly feeling  light-headed or weak. Getting tired easily during activity. Fainting. Sweating. In some cases, there are no symptoms.  How is this treated? Treatment for this condition depends on underlying conditions and how you feel when you have atrial fibrillation. They include: Medicines to: Prevent  blood clots. Treat heart rate or heart rhythm problems. Using devices, such as a pacemaker, to correct heart rhythm problems. Doing surgery to remove the part of the heart that sends bad signals. Closing an area where clots can form in the heart (left atrial appendage). In some cases, your doctor will treat other underlying conditions.  Follow these instructions at home:  Medicines Take over-the-counter and prescription medicines only as told by your doctor. Do not take any new medicines without first talking to your doctor. If you are taking blood thinners: Talk with your doctor before you take any medicines that have aspirin or NSAIDs, such as ibuprofen, in them. Take your medicine exactly as told by your doctor. Take it at the same time each day. Avoid activities that could hurt or bruise you. Follow instructions about how to prevent falls. Wear a bracelet that says you are taking blood thinners. Or, carry a card that lists what medicines you take. Lifestyle         Do not use any products that have nicotine or tobacco in them. These include cigarettes, e-cigarettes, and chewing tobacco. If you need help quitting, ask your doctor. Eat heart-healthy foods. Talk with your doctor about the right eating plan for you. Exercise regularly as told by your doctor. Do not drink alcohol. Lose weight if you are overweight. Do not use drugs, including cannabis.  General instructions If you have a condition that causes breathing to stop for a short period of time (apnea), treat it as told by your doctor. Keep a healthy weight. Do not use diet pills unless your doctor says they are safe for you. Diet pills may make heart problems worse. Keep all follow-up visits as told by your doctor. This is important.  Contact a doctor if: You notice a change in the speed, rhythm, or strength of your heartbeat. You are taking a blood-thinning medicine and you get more bruising. You get tired more easily  when you move or exercise. You have a sudden change in weight.  Get help right away if:    You have pain in your chest or your belly (abdomen). You have trouble breathing. You have side effects of blood thinners, such as blood in your vomit, poop (stool), or pee (urine), or bleeding that cannot stop. You have any signs of a stroke. "BE FAST" is an easy way to remember the main warning signs: B - Balance. Signs are dizziness, sudden trouble walking, or loss of balance. E - Eyes. Signs are trouble seeing or a change in how you see. F - Face. Signs are sudden weakness or loss of feeling in the face, or the face or eyelid drooping on one side. A - Arms. Signs are weakness or loss of feeling in an arm. This happens suddenly and usually on one side of the body. S - Speech. Signs are sudden trouble speaking, slurred speech, or trouble understanding what people say. T - Time. Time to call emergency services. Write down what time symptoms started. You have other signs of a stroke, such as: A sudden, very bad headache with no known cause. Feeling like you may vomit (nausea). Vomiting. A seizure.  These symptoms may be an emergency. Do not wait to  see if the symptoms will go away. Get medical help right away. Call your local emergency services (911 in the [place] Do not drive yourself to the hospital. Summary Atrial fibrillation is a type of heartbeat that is irregular or fast. You are at higher risk of this condition if you smoke, are older, have diabetes, or are overweight. Follow your doctor's instructions about medicines, diet, exercise, and follow-up visits. Get help right away if you have signs or symptoms of a stroke. Get help right away if you cannot catch your breath, or you have chest pain or discomfort. This information is not intended to replace advice given to you by your health care provider. Make sure you discuss any questions you have with your health care provider. Document  Revised: 09/27/2018 Document Reviewed: 09/27/2018 Elsevier Patient Education  2020 [employer].

## 2023-08-12 NOTE — TOC Benefit Eligibility Note (Signed)
 Pharmacy Patient Advocate Encounter  Insurance verification completed.    The patient is insured through [employer]. Patient has [employer], may use a copay card, and/or apply for patient assistance if available.    Ran test claim for Eliquis 5mg  and the current 30 day co-pay is $0.   This test claim was processed through [facility]- copay amounts may vary at other pharmacies due to [employer], or as the patient moves through the different stages of their insurance plan.

## 2023-08-12 NOTE — Evaluation (Signed)
 Occupational Therapy Evaluation and Discharge Summary Patient Details Name: Francisco [name] MRN: [id number] DOB: 03-04-1961 Today's Date: 08/12/2023   History of Present Illness   Pt is a 63 yo male admitted with ARF followed by new afib with RVR. PMH: COPD     Clinical Impressions Pt admitted with the above diagnosis and overall is at baseline with adls and mobility. Pt was in afib during eval with HR up to 126 during ambulation.  Pt was on 4L 02 on arrival.  Nursing asked to see how pt did on RA when walking.  O2 sats as follows:    On 2L at rest:  98% Walking on RA:  87% 2L replaced at rest: 89% 3L replaced at rest 95%  Spoke with pt about energy conservation techniques and pursed lip breathing techniques and nursing aware of pt needing O2 at this time. Pt does not have further acute OT needs.      If plan is discharge home, recommend the following:   Other (comment) (needs to monitor HR and O2 sats)     Functional Status Assessment   Patient has not had a recent decline in their functional status     Equipment Recommendations   None recommended by OT     Recommendations for Other Services         Precautions/Restrictions   Precautions Precautions: Other (comment) Recall of Precautions/Restrictions: Intact Precaution/Restrictions Comments: watch O2 sats Restrictions Weight Bearing Restrictions Per Provider Order: No     Mobility Bed Mobility Overal bed mobility: Modified Independent             General bed mobility comments: no assist needed    Transfers Overall transfer level: Independent Equipment used: None               General transfer comment: no assist needed      Balance Overall balance assessment: Mild deficits observed, not formally tested                                         ADL either performed or assessed with clinical judgement   ADL Overall ADL's : At baseline                                        General ADL Comments: Pt appears to be at baseline with all adls. Pt bathes, dresses, toilets and groooms without assist. Pt does not drive at baseline.     Vision Baseline Vision/History: 1 Wears glasses Ability to See in Adequate Light: 0 Adequate Patient Visual Report: No change from baseline Vision Assessment?: No apparent visual deficits     Perception Perception: Within Functional Limits       Praxis Praxis: WFL       Pertinent Vitals/Pain Pain Assessment Pain Assessment: 0-10 Pain Score: 4  Pain Location: back Pain Descriptors / Indicators: Aching Pain Intervention(s): Monitored during session, Repositioned     Extremity/Trunk Assessment Upper Extremity Assessment Upper Extremity Assessment: Overall WFL for tasks assessed   Lower Extremity Assessment Lower Extremity Assessment: Overall WFL for tasks assessed   Cervical / Trunk Assessment Cervical / Trunk Assessment: Back Surgery   Communication Communication Communication: No apparent difficulties   Cognition Arousal: Alert Behavior During Therapy: WFL for tasks assessed/performed Cognition: No apparent impairments  Following commands: Intact       Cueing  General Comments   Cueing Techniques: Verbal cues  Pt appears to be at baseline with adls and ambulation in hallway outside of low O2 sats on RA and pt being in afib with RVR with highest HR of 126.   Exercises     Shoulder Instructions      Home Living Family/patient expects to be discharged to:: Private residence Living Arrangements: Non-relatives/Friends Available Help at Discharge: Friend(s);Available PRN/intermittently Type of Home: House Home Access: Stairs to enter [employer] of Steps: 6 Entrance Stairs-Rails: Right;Left;Can reach both Home Layout: One level     Bathroom Shower/Tub: Producer, television/film/video: Standard     Home Equipment: None    Additional Comments: Pt states he no longer has O2 at home.  Roommate ist there most of the time he is there.      Prior Functioning/Environment Prior Level of Function : Independent/Modified Independent             Mobility Comments: no assist PTA ADLs Comments: independent but does not drive    OT Problem List: Cardiopulmonary status limiting activity   OT Treatment/Interventions:        OT Goals(Current goals can be found in the care plan section)   Acute Rehab OT Goals Patient Stated Goal: to go home OT Goal Formulation: All assessment and education complete, DC therapy   OT Frequency:       Co-evaluation              AM-PAC OT "6 Clicks" Daily Activity     Outcome Measure Help from another person eating meals?: None Help from another person taking care of personal grooming?: None Help from another person toileting, which includes using toliet, bedpan, or urinal?: None Help from another person bathing (including washing, rinsing, drying)?: None Help from another person to put on and taking off regular upper body clothing?: None Help from another person to put on and taking off regular lower body clothing?: None 6 Click Score: 24   End of Session Equipment Utilized During Treatment: Oxygen  Nurse Communication: Mobility status;Other (comment) (O2 sats in 80s on RA)  Activity Tolerance: Patient limited by fatigue Patient left: in bed;with call bell/phone within reach  OT Visit Diagnosis: Unsteadiness on feet (R26.81)                Time: 1478-2956 OT Time Calculation (min): 25 min Charges:  OT General Charges $OT Visit: 1 Visit OT Evaluation $OT Eval Low Complexity: 1 Low  Carla Charon 08/12/2023, 10:05 AM

## 2023-08-12 NOTE — Plan of Care (Signed)

## 2023-08-12 NOTE — Progress Notes (Signed)
 PHARMACY - ANTICOAGULATION CONSULT NOTE  Pharmacy Consult for Heparin  >> apixaban  Indication: atrial fibrillation  Allergies  Allergen Reactions   Ivp Dye [Iodinated Contrast Media] Other (See Comments)    Seizures    Tramadol Other (See Comments)    Seizures    Doxycycline  Other (See Comments)    Burning sensation to his hands.    Patient Measurements: Height: 6' (182.9 cm) Weight: 75 kg (165 lb 5.5 oz) IBW/kg (Calculated) : 77.6 HEPARIN  DW (KG): 75  Vital Signs: Temp: 98.4 F (36.9 C) (04/25 0743) Temp Source: Oral (04/25 0743) BP: 127/83 (04/25 0743) Pulse Rate: 90 (04/25 0743)  Labs: Recent Labs    08/09/23 2312 08/10/23 0004 08/10/23 0404 08/12/23 0642  HGB 13.5 12.9* 13.8 14.0  HCT 40.2 38.0* 41.2 44.3  PLT 320  --  330 331  HEPARINUNFRC  --   --   --  0.64  CREATININE 1.04  --  1.10 1.08    Estimated Creatinine Clearance: 75.2 mL/min (by C-G formula based on SCr of 1.08 mg/dL).   Medical History: Past Medical History:  Diagnosis Date   CHF (congestive heart failure) (HCC)    COPD (chronic obstructive pulmonary disease) (HCC)    COPD with acute exacerbation (HCC) 10/21/2021   Coronary artery disease    a. s/p prior PCI.   Hepatitis-C    Hypertension    IV drug abuse (HCC)    a. 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   Medically noncompliant    MI (myocardial infarction) (HCC)    Tobacco abuse       Assessment: 63 y.o. M presents with COPD exacerbation with sudden new afib RVR. No anticoagulation prior to admission. Pharmacy consulted for heparin  to apixaban .   Heparin  level 0.64 is at high end of therapeutic on 1150 units/hr. Heparin  bolus was given at 18:36, level drawn 12 hours later.CBC stable.  Clcr 75 ml/min.   Goal of Therapy:  Heparin  level 0.3-0.7 units/ml Monitor platelets by anticoagulation protocol: Yes   Plan:   Stop Heparin   Start apixaban  5mg  BID Monitor signs/symptoms of bleeding    Dorene Gang, PharmD,  BCPS, BCCP Clinical Pharmacist  Please check AMION for all [facility] Pharmacy phone numbers After 10:00 PM, call Main Pharmacy [phone]

## 2023-08-12 NOTE — Progress Notes (Signed)
 PT Cancellation Note  Patient Details Name: Brandun Pinn MRN: [id number] DOB: Oct 30, 1960   Cancelled Treatment:    Reason Eval/Treat Not Completed: PT screened, no needs identified, will sign off (Per OT, pt mobility at baseline and has no acute PT needs. Will sign off.)   Taneesha Edgin 08/12/2023, 10:01 AM

## 2023-08-13 ENCOUNTER — Inpatient Hospital Stay (HOSPITAL_COMMUNITY)
Admission: EM | Admit: 2023-08-13 | Discharge: 2023-08-15 | Discharge status: Against Medical Advice | Source: Home / Self Care | Attending: Internal Medicine | Admitting: Internal Medicine

## 2023-08-13 ENCOUNTER — Emergency Department (HOSPITAL_COMMUNITY)

## 2023-08-13 ENCOUNTER — Other Ambulatory Visit: Payer: Self-pay

## 2023-08-13 ENCOUNTER — Encounter (HOSPITAL_COMMUNITY): Payer: Self-pay

## 2023-08-13 ENCOUNTER — Ambulatory Visit (HOSPITAL_COMMUNITY)

## 2023-08-13 DIAGNOSIS — J440 Chronic obstructive pulmonary disease with acute lower respiratory infection: Secondary | ICD-10-CM | POA: Diagnosis present

## 2023-08-13 DIAGNOSIS — J441 Chronic obstructive pulmonary disease with (acute) exacerbation: Secondary | ICD-10-CM | POA: Diagnosis present

## 2023-08-13 DIAGNOSIS — I5042 Chronic combined systolic (congestive) and diastolic (congestive) heart failure: Secondary | ICD-10-CM | POA: Diagnosis present

## 2023-08-13 DIAGNOSIS — I10 Essential (primary) hypertension: Secondary | ICD-10-CM | POA: Diagnosis present

## 2023-08-13 DIAGNOSIS — I2510 Atherosclerotic heart disease of native coronary artery without angina pectoris: Secondary | ICD-10-CM | POA: Diagnosis present

## 2023-08-13 DIAGNOSIS — I252 Old myocardial infarction: Secondary | ICD-10-CM

## 2023-08-13 DIAGNOSIS — F17210 Nicotine dependence, cigarettes, uncomplicated: Secondary | ICD-10-CM | POA: Diagnosis present

## 2023-08-13 DIAGNOSIS — I110 Hypertensive heart disease with heart failure: Secondary | ICD-10-CM | POA: Diagnosis present

## 2023-08-13 DIAGNOSIS — Z79899 Other long term (current) drug therapy: Secondary | ICD-10-CM

## 2023-08-13 DIAGNOSIS — J9612 Chronic respiratory failure with hypercapnia: Secondary | ICD-10-CM | POA: Diagnosis present

## 2023-08-13 DIAGNOSIS — J9622 Acute and chronic respiratory failure with hypercapnia: Secondary | ICD-10-CM

## 2023-08-13 DIAGNOSIS — I5040 Unspecified combined systolic (congestive) and diastolic (congestive) heart failure: Secondary | ICD-10-CM | POA: Diagnosis present

## 2023-08-13 DIAGNOSIS — Z6823 Body mass index (BMI) 23.0-23.9, adult: Secondary | ICD-10-CM

## 2023-08-13 DIAGNOSIS — Z8673 Personal history of transient ischemic attack (TIA), and cerebral infarction without residual deficits: Secondary | ICD-10-CM

## 2023-08-13 DIAGNOSIS — E785 Hyperlipidemia, unspecified: Secondary | ICD-10-CM | POA: Diagnosis present

## 2023-08-13 DIAGNOSIS — I480 Paroxysmal atrial fibrillation: Secondary | ICD-10-CM | POA: Diagnosis present

## 2023-08-13 DIAGNOSIS — Z9861 Coronary angioplasty status: Secondary | ICD-10-CM

## 2023-08-13 DIAGNOSIS — Z7982 Long term (current) use of aspirin: Secondary | ICD-10-CM

## 2023-08-13 DIAGNOSIS — Z8701 Personal history of pneumonia (recurrent): Secondary | ICD-10-CM

## 2023-08-13 DIAGNOSIS — D649 Anemia, unspecified: Secondary | ICD-10-CM | POA: Diagnosis present

## 2023-08-13 DIAGNOSIS — E440 Moderate protein-calorie malnutrition: Secondary | ICD-10-CM | POA: Diagnosis present

## 2023-08-13 DIAGNOSIS — E876 Hypokalemia: Secondary | ICD-10-CM | POA: Diagnosis present

## 2023-08-13 DIAGNOSIS — I4891 Unspecified atrial fibrillation: Secondary | ICD-10-CM

## 2023-08-13 DIAGNOSIS — J189 Pneumonia, unspecified organism: Secondary | ICD-10-CM | POA: Diagnosis present

## 2023-08-13 DIAGNOSIS — Z91041 Radiographic dye allergy status: Secondary | ICD-10-CM

## 2023-08-13 DIAGNOSIS — Z5329 Procedure and treatment not carried out because of patient's decision for other reasons: Secondary | ICD-10-CM | POA: Diagnosis present

## 2023-08-13 DIAGNOSIS — J9601 Acute respiratory failure with hypoxia: Secondary | ICD-10-CM | POA: Diagnosis present

## 2023-08-13 DIAGNOSIS — Z720 Tobacco use: Secondary | ICD-10-CM | POA: Diagnosis present

## 2023-08-13 DIAGNOSIS — I4892 Unspecified atrial flutter: Secondary | ICD-10-CM | POA: Diagnosis present

## 2023-08-13 DIAGNOSIS — F1510 Other stimulant abuse, uncomplicated: Secondary | ICD-10-CM | POA: Diagnosis present

## 2023-08-13 DIAGNOSIS — Z1152 Encounter for screening for COVID-19: Secondary | ICD-10-CM

## 2023-08-13 DIAGNOSIS — J9621 Acute and chronic respiratory failure with hypoxia: Secondary | ICD-10-CM

## 2023-08-13 DIAGNOSIS — E119 Type 2 diabetes mellitus without complications: Secondary | ICD-10-CM | POA: Diagnosis present

## 2023-08-13 LAB — CBC
HCT: 41.1 % (ref 39.0–52.0)
Hemoglobin: 13.4 g/dL (ref 13.0–17.0)
MCH: 31.3 pg (ref 26.0–34.0)
MCHC: 32.6 g/dL (ref 30.0–36.0)
MCV: 96 fL (ref 80.0–100.0)
Platelets: 312 10*3/uL (ref 150–400)
RBC: 4.28 MIL/uL (ref 4.22–5.81)
RDW: 14.2 % (ref 11.5–15.5)
WBC: 11.3 10*3/uL — ABNORMAL HIGH (ref 4.0–10.5)
nRBC: 0 % (ref 0.0–0.2)

## 2023-08-13 LAB — I-STAT VENOUS BLOOD GAS, ED
Acid-Base Excess: 5 mmol/L — ABNORMAL HIGH (ref 0.0–2.0)
Bicarbonate: 30.9 mmol/L — ABNORMAL HIGH (ref 20.0–28.0)
Calcium, Ion: 1.2 mmol/L (ref 1.15–1.40)
HCT: 41 % (ref 39.0–52.0)
Hemoglobin: 13.9 g/dL (ref 13.0–17.0)
O2 Saturation: 86 %
Potassium: 3.3 mmol/L — ABNORMAL LOW (ref 3.5–5.1)
Sodium: 142 mmol/L (ref 135–145)
TCO2: 32 mmol/L (ref 22–32)
pCO2, Ven: 48.5 mmHg (ref 44–60)
pH, Ven: 7.413 (ref 7.25–7.43)
pO2, Ven: 51 mmHg — ABNORMAL HIGH (ref 32–45)

## 2023-08-13 LAB — PROCALCITONIN: Procalcitonin: <0.1 ng/mL

## 2023-08-13 LAB — RAPID URINE DRUG SCREEN, HOSP PERFORMED
Amphetamines: POSITIVE — AB
Barbiturates: NOT DETECTED
Benzodiazepines: POSITIVE — AB
Cocaine: NOT DETECTED
Opiates: NOT DETECTED
Tetrahydrocannabinol: NOT DETECTED

## 2023-08-13 LAB — COMPREHENSIVE METABOLIC PANEL WITH GFR
ALT: 28 U/L (ref 0–44)
AST: 20 U/L (ref 15–41)
Albumin: 2.9 g/dL — ABNORMAL LOW (ref 3.5–5.0)
Alkaline Phosphatase: 61 U/L (ref 38–126)
Anion gap: 11 (ref 5–15)
BUN: 16 mg/dL (ref 8–23)
CO2: 28 mmol/L (ref 22–32)
Calcium: 8.8 mg/dL — ABNORMAL LOW (ref 8.9–10.3)
Chloride: 104 mmol/L (ref 98–111)
Creatinine, Ser: 0.99 mg/dL (ref 0.61–1.24)
GFR, Estimated: >60 mL/min (ref >60)
Glucose, Bld: 173 mg/dL — ABNORMAL HIGH (ref 70–99)
Potassium: 3.4 mmol/L — ABNORMAL LOW (ref 3.5–5.1)
Sodium: 143 mmol/L (ref 135–145)
Total Bilirubin: 0.4 mg/dL (ref 0.0–1.2)
Total Protein: 5.7 g/dL — ABNORMAL LOW (ref 6.5–8.1)

## 2023-08-13 LAB — CBC WITH DIFFERENTIAL/PLATELET
Abs Immature Granulocytes: 0.16 10*3/uL — ABNORMAL HIGH (ref 0.00–0.07)
Basophils Absolute: 0.1 10*3/uL (ref 0.0–0.1)
Basophils Relative: 0 %
Eosinophils Absolute: 0.1 10*3/uL (ref 0.0–0.5)
Eosinophils Relative: 1 %
HCT: 42 % (ref 39.0–52.0)
Hemoglobin: 13.7 g/dL (ref 13.0–17.0)
Immature Granulocytes: 1 %
Lymphocytes Relative: 23 %
Lymphs Abs: 2.7 10*3/uL (ref 0.7–4.0)
MCH: 31.4 pg (ref 26.0–34.0)
MCHC: 32.6 g/dL (ref 30.0–36.0)
MCV: 96.3 fL (ref 80.0–100.0)
Monocytes Absolute: 1.6 10*3/uL — ABNORMAL HIGH (ref 0.1–1.0)
Monocytes Relative: 13 %
Neutro Abs: 7.1 10*3/uL (ref 1.7–7.7)
Neutrophils Relative %: 62 %
Platelets: 290 10*3/uL (ref 150–400)
RBC: 4.36 MIL/uL (ref 4.22–5.81)
RDW: 13.9 % (ref 11.5–15.5)
WBC: 11.7 10*3/uL — ABNORMAL HIGH (ref 4.0–10.5)
nRBC: 0 % (ref 0.0–0.2)

## 2023-08-13 LAB — BASIC METABOLIC PANEL WITH GFR
Anion gap: 10 (ref 5–15)
BUN: 25 mg/dL — ABNORMAL HIGH (ref 8–23)
CO2: 27 mmol/L (ref 22–32)
Calcium: 8.7 mg/dL — ABNORMAL LOW (ref 8.9–10.3)
Chloride: 103 mmol/L (ref 98–111)
Creatinine, Ser: 0.91 mg/dL (ref 0.61–1.24)
GFR, Estimated: >60 mL/min (ref >60)
Glucose, Bld: 119 mg/dL — ABNORMAL HIGH (ref 70–99)
Potassium: 4.1 mmol/L (ref 3.5–5.1)
Sodium: 140 mmol/L (ref 135–145)

## 2023-08-13 LAB — TSH: TSH: 0.485 u[IU]/mL (ref 0.350–4.500)

## 2023-08-13 LAB — HEMOGLOBIN A1C
Hgb A1c MFr Bld: 5.8 % — ABNORMAL HIGH (ref 4.8–5.6)
Mean Plasma Glucose: 119.76 mg/dL

## 2023-08-13 LAB — MAGNESIUM
Magnesium: 2.1 mg/dL (ref 1.7–2.4)
Magnesium: 1.8 mg/dL (ref 1.7–2.4)

## 2023-08-13 LAB — PHOSPHORUS: Phosphorus: 2 mg/dL — ABNORMAL LOW (ref 2.5–4.6)

## 2023-08-13 LAB — CK: Total CK: 59 U/L (ref 49–397)

## 2023-08-13 LAB — BRAIN NATRIURETIC PEPTIDE: B Natriuretic Peptide: 393 pg/mL — ABNORMAL HIGH (ref 0.0–100.0)

## 2023-08-13 LAB — TROPONIN I (HIGH SENSITIVITY)
Troponin I (High Sensitivity): 28 ng/L — ABNORMAL HIGH (ref <18)
Troponin I (High Sensitivity): 24 ng/L — ABNORMAL HIGH (ref <18)

## 2023-08-13 MED ORDER — PREDNISONE 20 MG PO TABS
40.0000 mg | ORAL_TABLET | Freq: Every day | ORAL | Status: DC
  Administered 2023-08-14: 40 mg via ORAL
  Filled 2023-08-13 (×2): qty 2

## 2023-08-13 MED ORDER — DILTIAZEM HCL-DEXTROSE 125-5 MG/125ML-% IV SOLN (PREMIX)
5.0000 mg/h | INTRAVENOUS | Status: AC
  Administered 2023-08-13: 5 mg/h via INTRAVENOUS
  Administered 2023-08-14: 15 mg/h via INTRAVENOUS
  Filled 2023-08-13 (×2): qty 125

## 2023-08-13 MED ORDER — METOPROLOL TARTRATE 25 MG PO TABS
25.0000 mg | ORAL_TABLET | Freq: Two times a day (BID) | ORAL | Status: DC
  Administered 2023-08-14 (×2): 25 mg via ORAL
  Filled 2023-08-13 (×3): qty 1

## 2023-08-13 MED ORDER — METHYLPREDNISOLONE SODIUM SUCC 125 MG IJ SOLR
125.0000 mg | Freq: Once | INTRAMUSCULAR | Status: AC
Start: 2023-08-13 — End: 2023-08-13
  Administered 2023-08-13: 125 mg via INTRAVENOUS
  Filled 2023-08-13: qty 2

## 2023-08-13 MED ORDER — NICOTINE 14 MG/24HR TD PT24
14.0000 mg | MEDICATED_PATCH | Freq: Every day | TRANSDERMAL | Status: DC
  Administered 2023-08-14: 14 mg via TRANSDERMAL
  Filled 2023-08-13 (×2): qty 1

## 2023-08-13 MED ORDER — THIAMINE HCL 100 MG/ML IJ SOLN
100.0000 mg | Freq: Every day | INTRAMUSCULAR | Status: DC
  Administered 2023-08-13 – 2023-08-14 (×2): 100 mg via INTRAVENOUS
  Filled 2023-08-13 (×2): qty 2

## 2023-08-13 MED ORDER — DILTIAZEM LOAD VIA INFUSION
10.0000 mg | Freq: Once | INTRAVENOUS | Status: AC
  Administered 2023-08-13: 10 mg via INTRAVENOUS
  Filled 2023-08-13: qty 10

## 2023-08-13 MED ORDER — IPRATROPIUM-ALBUTEROL 0.5-2.5 (3) MG/3ML IN SOLN
3.0000 mL | Freq: Once | RESPIRATORY_TRACT | Status: AC
  Administered 2023-08-13: 3 mL via RESPIRATORY_TRACT
  Filled 2023-08-13: qty 3

## 2023-08-13 MED ORDER — POTASSIUM CHLORIDE CRYS ER 20 MEQ PO TBCR
40.0000 meq | EXTENDED_RELEASE_TABLET | Freq: Once | ORAL | Status: AC
  Administered 2023-08-13: 40 meq via ORAL
  Filled 2023-08-13: qty 2

## 2023-08-13 NOTE — ED Triage Notes (Addendum)
 Pt BIB [facility] from home for chest pain/shob/nausea. New onset A-fib per patient.  Seen here this morning but left AMA 20G LFA 324mg  ASA 0.8mg  NTG 10mg  albuterol  0.5mg  atrovent  20mg  cardizem  140/78 112HR

## 2023-08-13 NOTE — Assessment & Plan Note (Signed)
 Started on Eliquis .  Continue Lipitor  and metoprolol 

## 2023-08-13 NOTE — Assessment & Plan Note (Addendum)
 Patient has history of IV drug use with methamphetamines.  U tox positive for thiamine  at this time.  Monitor for probably sign of withdrawals Patient at this point willing to stay for admission Given IV drug use echogram ordered

## 2023-08-13 NOTE — Discharge Summary (Signed)
 [facility]  Physician Discharge Summary   Patient ID: Francisco [name] MRN: [id number] DOB/AGE: December 04, 1960 63 y.o.  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  He was evaluated in the emergency department.  Had to be intubated for respiratory failure.  Was admitted to the ICU for pneumonia.  Also developed atrial fibrillation with RVR.  There was also concern for seizure activity though EEG has been negative.  Transferred to the floor.   Consultants: Critical care medicine   Procedures: Echocardiogram is pending    HOSPITAL COURSE:   Acute hypoxic and hypercapnic respiratory failure/COPD with acute exacerbation/community-acquired pneumonia/H. influenzae Patient was intubated in the ICU.  Successfully extubated. Patient remains on ceftriaxone  and azithromycin . Noted to be on inhaled steroids. Also noted to be on prednisone . Respiratory status improving.  Attempts were ongoing to wean him off of oxygen  though it was possible that he may need oxygen  at home.   Atrial fibrillation with RVR Went into atrial fibrillation with RVR on 4/24.  Likely triggered by his respiratory issues.  Patient denies any history of atrial fibrillation previously that he does have history of coronary artery disease. Patient was transition from Cardizem  infusion to oral Cardizem .  Continued on metoprolol .   Echocardiogram was ordered but patient left AGAINST MEDICAL ADVICE before this could be done.  TSH noted to be normal. He does report a history of mini stroke previously.  His CHADS2 vascular score is 4.  Patient WAS started on Eliquis .     Essential hypertension   Concern for seizure at presentation No further seizure activity noted.  EEG was unremarkable.   Tobacco abuse disorder Patient counseled.   Moderate protein calorie malnutrition Nutrition Problem: Moderate Malnutrition Etiology: chronic illness (COPD)  Patient wants to go home today.  He was told that his heart rate is still not adequately controlled and still waiting on the echocardiogram.  He was adamant about leaving.  He was told about possible worsening of his condition if he were to leave the hospital before all workup was completed.  He was told about possibility of stroke, arrhythmias and possible death.  He understood these risks and proceeded to leave the hospital.  PATIENT LEFT AGAINST MEDICAL ADVICE   PERTINENT LABS:  The results of significant diagnostics from this hospitalization (including imaging, microbiology, ancillary and laboratory) are listed below for reference.    Microbiology: Recent Results (from the past 240 hours)  Resp panel by RT-PCR (RSV, Flu A&B, Covid) Anterior Nasal Swab     Status: None   Collection Time: 08/09/23  8:49 PM   Specimen: Anterior Nasal Swab  Result Value Ref Range Status   SARS Coronavirus 2 by RT PCR NEGATIVE NEGATIVE Final   Influenza A by PCR NEGATIVE NEGATIVE Final   Influenza B by PCR NEGATIVE NEGATIVE Final    Comment: (NOTE) The Xpert Xpress SARS-CoV-2/FLU/RSV plus assay is intended as an aid in the diagnosis of influenza from Nasopharyngeal  swab specimens and should not be used as a sole basis for treatment. Nasal washings and aspirates are unacceptable for Xpert Xpress SARS-CoV-2/FLU/RSV testing.  Fact Sheet for  Patients: BloggerCourse.com  Fact Sheet for Healthcare Providers: SeriousBroker.it  This test is not yet approved or cleared by the [place]  FDA and has been authorized for detection and/or diagnosis of SARS-CoV-2 by FDA under an Emergency Use Authorization (EUA). This EUA will remain in effect (meaning this test can be used) for the duration of the COVID-19 declaration under Section 564(b)(1) of the Act, 21 U.S.C. section 360bbb-3(b)(1), unless the authorization is terminated or revoked.     Resp Syncytial Virus by PCR NEGATIVE NEGATIVE Final    Comment: (NOTE) Fact Sheet for Patients: BloggerCourse.com  Fact Sheet for Healthcare Providers: SeriousBroker.it  This test is not yet approved or cleared by the [place]  FDA and has been authorized for detection and/or diagnosis of SARS-CoV-2 by FDA under an Emergency Use Authorization (EUA). This EUA will remain in effect (meaning this test can be used) for the duration of the COVID-19 declaration under Section 564(b)(1) of the Act, 21 U.S.C. section 360bbb-3(b)(1), unless the authorization is terminated or revoked.  Performed at [facility], [address], [place], [place] [address]   MRSA Next Gen by PCR, Nasal     Status: None   Collection Time: 08/09/23  9:44 PM   Specimen: Nasal Mucosa; Nasal Swab  Result Value Ref Range Status   MRSA by PCR Next Gen NOT DETECTED NOT DETECTED Final    Comment: (NOTE) The GeneXpert MRSA Assay (FDA approved for NASAL specimens only), is one component of a comprehensive MRSA colonization surveillance program. It is not intended to diagnose MRSA infection nor to guide or monitor treatment for MRSA infections. Test performance is not FDA approved in patients less than 43 years old. Performed at [facility], [address] [place], [place] [address]   Culture,  Respiratory w Gram Stain     Status: None   Collection Time: 08/09/23 11:40 PM   Specimen: Tracheal Aspirate  Result Value Ref Range Status   Specimen Description TRACHEAL ASPIRATE  Final   Special Requests NONE  Final   Gram Stain   Final    FEW WBC PRESENT, PREDOMINANTLY PMN MODERATE GRAM NEGATIVE RODS    Culture   Final    ABUNDANT HAEMOPHILUS INFLUENZAE BETA LACTAMASE POSITIVE Performed at [facility], [address], [place], [place] [address]    Report Status 08/12/2023 FINAL  Final  Culture, Respiratory w Gram Stain     Status: None   Collection Time: 08/10/23  5:00 PM   Specimen: Tracheal Aspirate; Respiratory  Result Value Ref Range Status   Specimen Description TRACHEAL ASPIRATE  Final   Special Requests NONE  Final   Gram Stain   Final    ABUNDANT WBC PRESENT, PREDOMINANTLY PMN RARE GRAM NEGATIVE RODS    Culture   Final    ABUNDANT HAEMOPHILUS INFLUENZAE BETA LACTAMASE POSITIVE Performed at [facility], [address] [place], [place], [place] [address]    Report Status 08/12/2023 FINAL  Final     Labs:   Basic Metabolic Panel: Recent Labs  Lab 08/09/23 1923 08/09/23 1926 08/09/23 2112 08/09/23 2312 08/10/23 0004 08/10/23 0404 08/12/23 0642 08/13/23 0547  NA 142  143 144 142  --  141 142 141 140  K 3.5  3.5 3.7 3.4*  --  3.7 3.8 4.3 4.1  CL  103 105  --   --   --  104 105 103  CO2  --  28  --   --   --  24 27 27   GLUCOSE 103* 104*  --   --   --  158* 97 119*  BUN 8 9  --   --   --  12 37* 25*  CREATININE 0.90 0.94  --  1.04  --  1.10 1.08 0.91  CALCIUM   --  9.3  --   --   --  8.9 8.8* 8.7*  MG  --   --   --   --   --  2.1  --  2.1  PHOS  --   --   --   --   --  3.0  --   --     CBC: Recent Labs  Lab 08/09/23 1926 08/09/23 2112 08/09/23 2312 08/10/23 0004 08/10/23 0404 08/12/23 0642 08/13/23 0547  WBC 18.4*  --  12.8*  --  11.6* 13.4* 11.3*  NEUTROABS 11.4*  --   --   --   --   --   --   HGB 15.8   < > 13.5 12.9* 13.8 14.0 13.4   HCT 48.6   < > 40.2 38.0* 41.2 44.3 41.1  MCV 95.7  --  93.7  --  93.8 97.4 96.0  PLT 406*  --  320  --  330 331 312   < > = values in this interval not displayed.   BNP: BNP (last 3 results) Recent Labs    07/17/23 1729 07/20/23 1304 08/09/23 1926  BNP 28.5 63.9 59.3     CBG: Recent Labs  Lab 08/12/23 0748 08/12/23 1138 08/12/23 1623 08/12/23 2052 08/12/23 2324  GLUCAP 83 129* 116* 217* 148*     IMAGING STUDIES Overnight EEG with video Result Date: 08/10/2023 Arleene Lack, MD     08/11/2023 10:34 AM Patient Name: Eirik Tinker MRN: [id number] Epilepsy Attending: Arleene Lack Referring Physician/Provider: Ogan, Okoronkwo U, MD Duration: 08/10/2023 7846 to 08/11/2023 9629 Patient history: 63yo M with intermittent twitching. EEG to evaluate for seizure Level of alertness: comatose-->awake, asleep AEDs during EEG study: Propofol , Versed  Technical aspects: This EEG study was done with scalp electrodes positioned according to the 10-20 International system of electrode placement. Electrical activity was reviewed with band pass filter of 1-70Hz , sensitivity of 7 uV/mm, display speed of 47mm/sec with a 60Hz  notched filter applied as appropriate. EEG data were recorded continuously and digitally stored.  Video monitoring was available and reviewed as appropriate. Description: EEG initially showed continuous generalized 3 to 6 Hz theta-delta slowing admixed with an excessive amount of 15 to 18 Hz beta activity distributed symmetrically and diffusely. Gradually as sedation was weaned EEG showed posterior dominant rhythm of 8 Hz activity of moderate voltage (25-35 uV) seen predominantly in posterior head regions, symmetric and reactive to eye opening and eye closing. Sleep was characterized by vertex waves, sleep spindles (12 to 14 Hz), maximal frontocentral region.  Hyperventilation and photic stimulation were not performed.   ABNORMALITY - Continuous slow, generalized IMPRESSION: This study  was initially suggestive of severe diffuse encephalopathy, likely related to sedation. As sedation was weaned, EEG improved and was within normal limits.  No seizures or epileptiform discharges were seen throughout the recording. Arleene Lack   DG Chest Portable 1 View Result Date: 08/09/2023 CLINICAL DATA:  ETT placement EXAM: PORTABLE CHEST 1 VIEW COMPARISON:  None Available. FINDINGS: Lungs  are clear. No pneumothorax or pleural effusion. Normal pulmonary vasculature. Unremarkable cardiac silhouette. Endotracheal tube tip 4 cm above carina. NG tube tip extends below the diaphragm and off the x-ray. IMPRESSION: No active disease. Electronically Signed   By: Sydell Eva M.D.   On: 08/09/2023 21:22   CT Head Wo Contrast Result Date: 08/09/2023 CLINICAL DATA:  Status post trauma. EXAM: CT HEAD WITHOUT CONTRAST TECHNIQUE: Contiguous axial images were obtained from the base of the skull through the vertex without intravenous contrast. RADIATION DOSE REDUCTION: This exam was performed according to the departmental dose-optimization program which includes automated exposure control, adjustment of the mA and/or kV according to patient size and/or use of iterative reconstruction technique. COMPARISON:  February 13, 2023 FINDINGS: Brain: There is mild cerebral atrophy with widening of the extra-axial spaces and ventricular dilatation. There are areas of decreased attenuation within the white matter tracts of the supratentorial brain, consistent with microvascular disease changes. A chronic left basal ganglia infarct is noted. Vascular: Marked severity bilateral cavernous carotid artery calcification is noted. Skull: Normal. Negative for fracture or focal lesion. Sinuses/Orbits: There is mild left maxillary sinus mucosal thickening. Other: Endotracheal and enteric tubes are noted. IMPRESSION: 1. No acute intracranial abnormality. 2. Generalized cerebral atrophy with chronic white matter small vessel ischemic  changes. 3. Chronic left basal ganglia infarct. Electronically Signed   By: Virgle Grime M.D.   On: 08/09/2023 20:07   DG Chest Port 1 View Result Date: 08/09/2023 CLINICAL DATA:  Shortness of breath EXAM: PORTABLE CHEST 1 VIEW COMPARISON:  07/20/2023 FINDINGS: Endotracheal tube tip in the intrathoracic trachea 6.9 cm from the carina. Subdiaphragmatic enteric tube. No focal consolidation, pleural effusion, or pneumothorax. No displaced rib fractures. Stable cardiomediastinal silhouette. IMPRESSION: Endotracheal tube tip in the intrathoracic trachea 6.9 cm from the carina. No acute cardiopulmonary process. Electronically Signed   By: Rozell Cornet M.D.   On: 08/09/2023 20:04   DG Abdomen 1 View Result Date: 08/09/2023 CLINICAL DATA:  Orogastric tube EXAM: ABDOMEN - 1 VIEW COMPARISON:  None Available. FINDINGS: Orogastric tube tip is in the proximal body of the stomach. The stomach is mildly dilated. There are postsurgical changes in the lower lumbar spine. IMPRESSION: Orogastric tube tip is in the proximal body of the stomach. Electronically Signed   By: Tyron Gallon M.D.   On: 08/09/2023 20:02   DG Chest 2 View Result Date: 07/20/2023 CLINICAL DATA:  Shortness of breath. EXAM: CHEST - 2 VIEW COMPARISON:  Chest radiograph dated 07/17/2023. FINDINGS: Stable cardiomediastinal silhouette. Hyperinflation. No focal consolidation, pleural effusion, or pneumothorax. Similar slight compression deformities of the midthoracic spine. No acute osseous abnormality. IMPRESSION: Hyperinflation.  No acute cardiopulmonary findings. Electronically Signed   By: Mannie Seek M.D.   On: 07/20/2023 14:58   DG Chest Port 1 View Result Date: 07/17/2023 CLINICAL DATA:  Shortness of breath EXAM: PORTABLE CHEST 1 VIEW COMPARISON:  July 14, 2023, July 04, 2023 FINDINGS: The cardiomediastinal silhouette is unchanged in contour. No pleural effusion. No pneumothorax. No acute pleuroparenchymal abnormality. IMPRESSION: No  acute cardiopulmonary abnormality. Previously described RIGHT apical nodular opacities are less conspicuous on current exam. Follow-up chest CT would be recommended given history of COPD. Electronically Signed   By: Clancy Crimes M.D.   On: 07/17/2023 17:46   DG Chest Portable 1 View Result Date: 07/14/2023 CLINICAL DATA:  Shortness of breath EXAM: PORTABLE CHEST 1 VIEW COMPARISON:  Chest radiograph dated 07/04/2023 FINDINGS: Normal lung volumes. Irregular right apical densities. No pleural effusion  or pneumothorax. The heart size and mediastinal contours are within normal limits. No acute osseous abnormality. IMPRESSION: Irregular right apical densities, which may represent scarring or pulmonary nodules. Recommend further evaluation with chest CT. Electronically Signed   By: Limin  Xu M.D.   On: 07/14/2023 13:38   PATIENT LEFT AGAINST MEDICAL ADVICE  Maylene Spear  [facility] Pager on [email or web address]  08/13/2023, 10:58 AM

## 2023-08-13 NOTE — H&P (Signed)
 Francisco [name] AOZ:[id number] DOB: 12/16/1960 DOA: 08/13/2023     PCP: Francisco Pitts, DO   Outpatient Specialists:   CARDS:  Dr. Peter Sw[place], MD   Patient arrived to ER on 08/13/23 at 1848 Referred by Attending Francisco Applebaum, MD   Patient coming from:    home Lives With roommate      Chief Complaint:  Chief Complaint  Patient presents with   Chest Pain   Shortness of Breath    HPI: Francisco [name] is a 63 y.o. male with medical history significant of   COPD, tobacco abuse, new A.fib, CAD, HTN, history of CVA, polysubstance abuse IV drug use    Presented with  shortness of breath Patient was just [facility] and left AMA today. History of COPD CAD hypertension That was originally admitted on 20 seconds for increased work of breathing Had to be intubated for respiratory failure. Was admitted to the ICU for pneumonia. Also developed atrial fibrillation with RVR. There was also concern for seizure activity though EEG has been negative. Transferred to the floor.  Found to have pneumonia treated with ceftriaxone  azithromycin  and prednisone  Patient was still hypoxic at the time of the examination" setting up home oxygen  which was not unable to be done because he left  Had an episode of a fever of RVR on 24 April secondary to respiratory distress no prior history.  Initially required Cardizem  infusion and transition to oral Cardizem  and metoprolol  echogram ordered but was not completed TSH normal  History of prior CVA he was started on Eliquis   Initially on prior admission patient thought there may have had some seizure activity prior to presentation EEG was done and unremarkable no further seizure was noted. Today patient elected to go home he was told that he still had significant tachycardia and that he puts his life at risk for leaving you chose to leave AMA.  Shortly after going home patient reported chest pain shortness of breath and nausea EMS  was called and they brought him back to emergency department heart rate 112 blood pressure 140/78 he was doing given 20 mg of Cardizem  given albuterol  and Atrovent  treatment given a dose of aspirin  324 Patient did endorse chest tightness when he went home.  Still has cough with clear sputum. Patient significantly wheezy when he came back    Patient unwilling to tell what business he needed to take care off but said that he only needed to go for couple hours and he called on longer than couple hours after he was done  Denies significant ETOH intake   Does   smoke  but interested in quitting  Reports IV DU last was 4 days ago Injects amphetamines      Regarding pertinent Chronic problems:     Hyperlipidemia -  on statins Lipitor  (atorvastatin )  Lipid Panel     Component Value Date/Time   CHOL 134 02/13/2023 2020   TRIG 44 08/10/2023 0404   HDL 49 02/13/2023 2020   CHOLHDL 2.7 02/13/2023 2020   VLDL 14 02/13/2023 2020   LDLCALC 71 02/13/2023 2020     HTN on metoprolol  Cardizem  Norvasc    chronic CHF diastolic  - last echo  Recent Results (from the past [id number] hours)  ECHOCARDIOGRAM COMPLETE   Collection Time: 02/07/23 11:45 AM  Result Value   Weight 2,938.29   Height 72   BP 110/77   S' Lateral 2.70   Area-P 1/2 4.31   Est EF 55 -  60%   Narrative      ECHOCARDIOGRAM REPORT      1. Left ventricular ejection fraction, by estimation, is 55 to 60%. The left ventricle has normal function. Left ventricular endocardial border not optimally defined to evaluate regional wall motion. Left ventricular diastolic parameters are consistent  with Grade II diastolic dysfunction (pseudonormalization).  2. Right ventricular systolic function is mildly reduced. The right ventricular size is normal. Tricuspid regurgitation signal is inadequate for assessing PA pressure.  3. The mitral valve is normal in structure. No evidence of mitral valve regurgitation. No evidence of mitral stenosis.  4. The  aortic valve is tricuspid. There is moderate calcification of the aortic valve. Aortic valve regurgitation is not visualized. No aortic stenosis is present.  5. The inferior vena cava is dilated in size with <50% respiratory variability, suggesting right atrial pressure of 15 mmHg.  6. Technically difficult study with poor acoustic windows.           CAD  - On aspirin  statin, betablocker,v                -followed by cardiology                - last cardiac cath        DM 2 -  Lab Results  Component Value Date   HGBA1C 5.8 (H) 08/13/2023   , diet controlled     COPD - not  followed by pulmonology   not  on baseline oxygen         A. Fib -   atrial fibrillation CHA2DS2 vas score  4 On metoprolol  Cardizem  Eliquis   History of CVA now should be on Eliquis  was taking baby aspirin      While in ER:     Found to be in A-fib with RVR started on Cardizem  drip   Lab Orders         Comprehensive metabolic panel         CBC with Differential         Brain natriuretic peptide         Urine rapid drug screen (hosp performed)         I-Stat venous blood gas, ED       CXR -  NON acute    Following Medications were ordered in ER: Medications  diltiazem  (CARDIZEM ) 1 mg/mL load via infusion 10 mg (10 mg Intravenous Bolus from Bag 08/13/23 2022)    And  diltiazem  (CARDIZEM ) 125 mg in dextrose  5% 125 mL (1 mg/mL) infusion (12.5 mg/hr Intravenous Rate/Dose Change 08/13/23 2135)  ipratropium-albuterol  (DUONEB) 0.5-2.5 (3) MG/3ML nebulizer solution 3 mL (3 mLs Nebulization Given 08/13/23 2016)  methylPREDNISolone  sodium succinate (SOLU-MEDROL ) 125 mg/2 mL injection 125 mg (125 mg Intravenous Given 08/13/23 2017)       ED Triage Vitals  Encounter Vitals Group     BP 08/13/23 1856 135/79     Systolic BP Percentile --      Diastolic BP Percentile --      Pulse Rate 08/13/23 1856 (!) 114     Resp 08/13/23 1856 (!) 26     Temp 08/13/23 1856 98.7 F (37.1 C)     Temp src --      SpO2  08/13/23 1857 97 %     Weight 08/13/23 1855 173 lb 1 oz (78.5 kg)     Height 08/13/23 1855 6' (1.829 m)     Head Circumference --      Peak  Flow --      Pain Score 08/13/23 1856 6     Pain Loc --      Pain Education --      Exclude from Growth Chart --   ZOXW(96)@     _________________________________________ Significant initial  Findings: Abnormal Labs Reviewed  COMPREHENSIVE METABOLIC PANEL WITH GFR - Abnormal; Notable for the following components:      Result Value   Potassium 3.4 (*)    Glucose, Bld 173 (*)    Calcium  8.8 (*)    Total Protein 5.7 (*)    Albumin  2.9 (*)    All other components within normal limits  CBC WITH DIFFERENTIAL/PLATELET - Abnormal; Notable for the following components:   WBC 11.7 (*)    Monocytes Absolute 1.6 (*)    Abs Immature Granulocytes 0.16 (*)    All other components within normal limits  BRAIN NATRIURETIC PEPTIDE - Abnormal; Notable for the following components:   B Natriuretic Peptide 393.0 (*)    All other components within normal limits  I-STAT VENOUS BLOOD GAS, ED - Abnormal; Notable for the following components:   pO2, Ven 51 (*)    Bicarbonate 30.9 (*)    Acid-Base Excess 5.0 (*)    Potassium 3.3 (*)    All other components within normal limits  TROPONIN I (HIGH SENSITIVITY) - Abnormal; Notable for the following components:   Troponin I (High Sensitivity) 28 (*)    All other components within normal limits      _________________________ Troponin  ordered Cardiac Panel (last 3 results) Recent Labs    08/13/23 1952 08/13/23 2153  TROPONINIHS 28* 24*     ECG: Ordered Personally reviewed and interpreted by me showing: HR : 128 Rhythm:  A.fib. W RVR,  Atrial fibrillation Nonspecific T abnormalities, lateral leads QTC 426  BNP (last 3 results) Recent Labs    07/20/23 1304 08/09/23 1926 08/13/23 2000  BNP 63.9 59.3 393.0*    ____________________   The recent clinical data is shown below. Vitals:   08/13/23 2045  08/13/23 2100 08/13/23 2115 08/13/23 2130  BP:  (!) 153/95 (!) 155/88 (!) 150/94  Pulse: (!) 118 (!) 120 (!) 114 (!) 112  Resp: (!) 21 (!) 24 (!) 24 18  Temp:      SpO2: 97% 96% 97% 97%  Weight:      Height:        WBC     Component Value Date/Time   WBC 11.7 (H) 08/13/2023 1952   LYMPHSABS 2.7 08/13/2023 1952   LYMPHSABS 1.7 05/18/2021 1518   MONOABS 1.6 (H) 08/13/2023 1952   EOSABS 0.1 08/13/2023 1952   EOSABS 0.0 05/18/2021 1518   BASOSABS 0.1 08/13/2023 1952   BASOSABS 0.0 05/18/2021 1518       Procalcitonin   Ordered      UA not ordered   ABX started Rocephin  azithromycin    ________________________________________________________________   Venous  Blood Gas result:  pH  7.413 Sodium 142 mmol/L   pCO2, Ven 48.5 mmHg Potassium 3.3 Low  mmol/L  pO2, Ven 51 High  mmHg     __________________________________________________________ Recent Labs  Lab 08/09/23 1926 08/09/23 2112 08/09/23 2312 08/10/23 0004 08/10/23 0404 08/12/23 0642 08/13/23 0547 08/13/23 1952 08/13/23 2011  NA 144   < >  --    < > 142 141 140 143 142  K 3.7   < >  --    < > 3.8 4.3 4.1 3.4* 3.3*  CO2 28  --   --   --  24 27 27 28   --   GLUCOSE 104*  --   --   --  158* 97 119* 173*  --   BUN 9  --   --   --  12 37* 25* 16  --   CREATININE 0.94  --  1.04  --  1.10 1.08 0.91 0.99  --   CALCIUM  9.3  --   --   --  8.9 8.8* 8.7* 8.8*  --   MG  --   --   --   --  2.1  --  2.1  --   --   PHOS  --   --   --   --  3.0  --   --   --   --    < > = values in this interval not displayed.    Cr   stable,   Lab Results  Component Value Date   CREATININE 0.99 08/13/2023   CREATININE 0.91 08/13/2023   CREATININE 1.08 08/12/2023    Recent Labs  Lab 08/13/23 1952  AST 20  ALT 28  ALKPHOS 61  BILITOT 0.4  PROT 5.7*  ALBUMIN  2.9*   Lab Results  Component Value Date   CALCIUM  8.8 (L) 08/13/2023   PHOS 3.0 08/10/2023       Plt: Lab Results  Component Value Date   PLT 290 08/13/2023        Recent Labs  Lab 08/09/23 1926 08/09/23 2112 08/09/23 2312 08/10/23 0004 08/10/23 0404 08/12/23 0642 08/13/23 0547 08/13/23 1952 08/13/23 2011  WBC 18.4*  --  12.8*  --  11.6* 13.4* 11.3* 11.7*  --   NEUTROABS 11.4*  --   --   --   --   --   --  7.1  --   HGB 15.8   < > 13.5   < > 13.8 14.0 13.4 13.7 13.9  HCT 48.6   < > 40.2   < > 41.2 44.3 41.1 42.0 41.0  MCV 95.7  --  93.7  --  93.8 97.4 96.0 96.3  --   PLT 406*  --  320  --  330 331 312 290  --    < > = values in this interval not displayed.    HG/HCT   stable,       Component Value Date/Time   HGB 13.9 08/13/2023 2011   HGB 17.5 05/18/2021 1518   HCT 41.0 08/13/2023 2011   HCT 51.9 (H) 05/18/2021 1518   MCV 96.3 08/13/2023 1952   MCV 93 05/18/2021 1518    _______________________________________________ Hospitalist was called for admission for COPD exacerbation   The following Work up has been ordered so far:  Orders Placed This Encounter  Procedures   DG Chest Portable 1 View   Comprehensive metabolic panel   CBC with Differential   Brain natriuretic peptide   Urine rapid drug screen (hosp performed)   Consult to hospitalist   I-Stat venous blood gas, ED     OTHER Significant initial  Findings:  labs showing:     DM  labs:  HbA1C: Recent Labs    02/13/23 2020 08/13/23 0547  HGBA1C 6.2* 5.8*      CBG (last 3)  Recent Labs    08/12/23 1623 08/12/23 2052 08/12/23 2324  GLUCAP 116* 217* 148*        Cultures:    Component Value Date/Time   SDES TRACHEAL ASPIRATE 08/10/2023 1700   SPECREQUEST NONE 08/10/2023 1700  CULT  08/10/2023 1700    ABUNDANT HAEMOPHILUS INFLUENZAE BETA LACTAMASE POSITIVE Performed at [facility], [address] [place], [place] [address]    REPTSTATUS 08/12/2023 FINAL 08/10/2023 1700     Radiological Exams on Admission: No results  found. _______________________________________________________________________________________________________ Latest  Blood pressure (!) 150/94, pulse (!) 112, temperature 98.7 F (37.1 C), resp. rate 18, height 6' (1.829 m), weight 78.5 kg, SpO2 97%.   Vitals  labs and radiology finding personally reviewed  Review of Systems:    Pertinent positives include:    fatigue, shortness of breath at rest.   dyspnea on exertion, Constitutional:  No weight loss, night sweats, Fevers, chills, weight loss  HEENT:  No headaches, Difficulty swallowing,Tooth/dental problems,Sore throat,  No sneezing, itching, ear ache, nasal congestion, post nasal drip,  Cardio-vascular:  No chest pain, Orthopnea, PND, anasarca, dizziness, palpitations.no Bilateral lower extremity swelling  GI:  No heartburn, indigestion, abdominal pain, nausea, vomiting, diarrhea, change in bowel habits, loss of appetite, melena, blood in stool, hematemesis Resp:  no No excess mucus, no productive cough, No non-productive cough, No coughing up of blood.No change in color of mucus.No wheezing. Skin:  no rash or lesions. No jaundice GU:  no dysuria, change in color of urine, no urgency or frequency. No straining to urinate.  No flank pain.  Musculoskeletal:  No joint pain or no joint swelling. No decreased range of motion. No back pain.  Psych:  No change in mood or affect. No depression or anxiety. No memory loss.  Neuro: no localizing neurological complaints, no tingling, no weakness, no double vision, no gait abnormality, no slurred speech, no confusion  All systems reviewed and apart from HOPI all are negative _______________________________________________________________________________________________ Past Medical History:   Past Medical History:  Diagnosis Date   CHF (congestive heart failure) (HCC)    COPD (chronic obstructive pulmonary disease) (HCC)    COPD with acute exacerbation (HCC) 10/21/2021   Coronary  artery disease    a. s/p prior PCI.   Hepatitis-C    Hypertension    IV drug abuse (HCC)    a. 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   Medically noncompliant    MI (myocardial infarction) (HCC)    Tobacco abuse       Past Surgical History:  Procedure Laterality Date   BACK SURGERY     NECK SURGERY     RIGHT/LEFT HEART CATH AND CORONARY ANGIOGRAPHY N/A 04/30/2019   Procedure: RIGHT/LEFT HEART CATH AND CORONARY ANGIOGRAPHY;  Surgeon: Arleen Lacer, MD;  Location: [facility] INVASIVE CV LAB;  Service: Cardiovascular;  Laterality: N/A;    Social History:  Ambulatory   independently      reports that he has been smoking cigarettes. He has been exposed to tobacco smoke. He has never used smokeless tobacco. He reports current drug use. Drug: Methamphetamines. He reports that he does not drink alcohol.     Family History:   Family History  Problem Relation Age of Onset   Rheum arthritis Mother    Heart disease Paternal Grandfather    ______________________________________________________________________________________________ Allergies: Allergies  Allergen Reactions   Ivp Dye [Iodinated Contrast Media] Other (See Comments)    Seizures    Tramadol Other (See Comments)    Seizures    Doxycycline  Other (See Comments)    Burning sensation to his hands.     Prior to Admission medications   Medication Sig Start Date End Date Taking? Authorizing Provider  albuterol  (PROVENTIL ) (2.5 MG/3ML) 0.083% nebulizer solution Use  1 vial (2.5 mg total) by nebulization every 6 (six) hours as needed for wheezing or shortness of breath. 06/16/23   Atway, Rayann N, DO  albuterol  (VENTOLIN  HFA) 108 (90 Base) MCG/ACT inhaler Inhale 2 puffs into the lungs every 4 (four) hours as needed for wheezing or shortness of breath. Patient taking differently: Inhale 2 puffs into the lungs See admin instructions. Inhale 2 puffs into the lungs every 1-2 hours as needed for shortness of breath or  wheezing 06/10/23   Nooruddin, Saad, MD  amLODipine  (NORVASC ) 5 MG tablet Take 1 tablet (5 mg total) by mouth daily. 08/09/23   [name], [name], DO  aspirin  EC 81 MG tablet Take 1 tablet (81 mg total) by mouth daily. Swallow whole. 02/15/23   Brayton Calin, MD  atorvastatin  (LIPITOR ) 40 MG tablet Take 1 tablet (40 mg total) by mouth daily. 07/06/23   [name], [name], DO  fluticasone  (FLONASE ) 50 MCG/ACT nasal spray Place 1 spray into both nostrils daily. Patient taking differently: Place 1 spray into both nostrils 2 (two) times daily as needed for allergies or rhinitis. 07/28/23   [name], [name], DO  Fluticasone -Umeclidin-Vilant (TRELEGY ELLIPTA ) 100-62.5-25 MCG/ACT AEPB Inhale 1 puff into the lungs daily. Patient taking differently: Inhale 2 puffs into the lungs 2 (two) times daily. 07/28/23 09/02/23  [name], [name], DO  lisinopril  (ZESTRIL ) 40 MG tablet Take 1/2 tablet (20 mg total) by mouth daily. 06/08/23   Amoako, Prince, MD  loratadine  (CLARITIN ) 10 MG tablet Take 1 tablet (10 mg total) by mouth daily. 07/28/23 07/27/24  [name], [name], DO  lovastatin  (MEVACOR ) 20 MG tablet Take 1 tablet (20 mg total) by mouth daily. Patient not taking: Reported on 08/11/2023 06/21/23   Nooruddin, Saad, MD  metoprolol  tartrate (LOPRESSOR ) 25 MG tablet Take 1 tablet (25 mg total) by mouth 2 (two) times daily. Patient not taking: Reported on 08/11/2023 06/08/23 09/06/23  Amoako, Prince, MD  nicotine  (NICODERM CQ  - DOSED IN MG/24 HOURS) 14 mg/24hr patch Place 1 patch (14 mg total) onto the skin daily. 07/28/23   [name], [name], DO  nitroGLYCERIN  (NITROSTAT ) 0.4 MG SL tablet Place 1 tablet (0.4 mg total) under the tongue every 5 (five) minutes x 3 doses as needed for chest pain. 10/14/22   NooruddinDann Dust, MD    ___________________________________________________________________________________________________ Physical Exam:    08/13/2023    9:30 PM 08/13/2023    9:15 PM 08/13/2023    9:00 PM  Vitals with BMI   Systolic 150 155 161  Diastolic 94 88 95  Pulse 112 114 120     1. General:  in No  Acute distress   Chronically ill   -appearing 2. Psychological: Alert and   Oriented 3. Head/ENT:    Dry Mucous Membranes                          Head Non traumatic, neck supple                           Poor Dentition 4. SKIN: decreased Skin turgor,  Skin clean Dry and intact no rash    5. Heart: Regular rate and rhythm no  Murmur, no Rub or gallop 6. Lungs some wheezes or crackles   7. Abdomen: Soft,  non-tender, Non distended  bowel sounds present 8. Lower extremities: no clubbing, cyanosis, no  edema 9. Neurologically Grossly intact, moving all 4 extremities equally  10. MSK: Normal range of motion  Chart has been reviewed  ______________________________________________________________________________________________  Assessment/Plan  63 y.o. male with medical history significant of   COPD, tobacco abuse, new A.fib, CAD, HTN, history of CVA, polysubstance abuse IV drug use   Admitted for COPD exacerbation/recent pneumonia/A-fib with RVR   Present on Admission:  COPD exacerbation (HCC)  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 hypercapnic respiratory failure (HCC)  CAP (community acquired pneumonia)  Combined systolic and diastolic congestive heart failure (HCC)  Hypertension  Methamphetamine use (HCC)  Tobacco abuse  Paroxysmal atrial fibrillation with RVR (HCC)  Hypokalemia     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 hypercapnic respiratory failure (HCC) In the setting of recent pneumonia.  Patient required intubation currently patient not requiring any oxygen .  Continue to monitor  CAD S/P percutaneous coronary angioplasty Lopressor  25 mg p.o. twice daily   Lipitor  40 mg daily  CAP (community acquired pneumonia)  Given patient recently been admitted for pneumonia and requiring intubation.  Will restart Rocephin  and azithromycin                   Provide oxygen  as needed.    Combined systolic and  diastolic congestive heart failure (HCC) Repeat echogram currently appears to be slightly on the dry side  COPD exacerbation (HCC)  -  - Will initiate: Steroid taper  -  Antibiotics Rocephin  - Albuterol   PRN, - scheduled duoneb,     -  Mucinex .  Titrate O2 to saturation >90%. Follow patients respiratory status.     VBG no evidence of hypercarbia    Currently mentating well no evidence of symptomatic hypercarbia   Hypertension Patient had had his metoprolol  restarted transition back to Cardizem  p.o. when able  Methamphetamine use (HCC) Patient has history of IV drug use with methamphetamines.  U tox positive for thiamine  at this time.  Monitor for probably sign of withdrawals Patient at this point willing to stay for admission Given IV drug use echogram ordered  History of stroke Started on Eliquis .  Continue Lipitor  and metoprolol   Tobacco abuse  - Spoke about importance of quitting spent 5 minutes discussing options for treatment, prior attempts at quitting, and dangers of smoking  -At this point patient is    interested in quitting  - order nicotine  patch   - nursing tobacco cessation protocol   Paroxysmal atrial fibrillation with RVR (HCC) Restarted Eliquis .  Patient has been started on diltiazem  drip while in emergency department will continue for now until heart rate under better control. Strongly encourage patient to discontinue amphetamine use Echo in Am  Tsh was checked prior  Hypokalemia - will replace electrolytes and repeat  check Mg, phos and Ca level and replace as needed Monitor on telemetry   Lab Results  Component Value Date   K 3.3 (L) 08/13/2023     Lab Results  Component Value Date   CREATININE 0.99 08/13/2023   Lab Results  Component Value Date   MG 2.1 08/13/2023   Lab Results  Component Value Date   CALCIUM  8.8 (L) 08/13/2023   PHOS 3.0 08/10/2023      Other plan as per orders.  DVT prophylaxis:  eliquis     Code Status:     Code Status: Prior FULL CODE  as per patient   I had personally discussed CODE STATUS with patient   ACP   none   Family Communication:   Family not at  Bedside    Diet heart healthy   Disposition Plan:     To home once workup is  complete and patient is stable   Following barriers for discharge:                                                        Electrolytes corrected                               Anemia  h/H stable                             Pain controlled with PO medications                               Afebrile, white count improving able to transition to PO antibiotics                             Will need to be able to tolerate PO                            Will likely need home health, home O2, set up                           Will need consultants to evaluate patient prior to discharge                           Work of breathing improves       Consult Orders  (From admission, onward)           Start     Ordered   08/13/23 2117  Consult to hospitalist  PG SENT BY DELORIS  Once       Provider:  (Not yet assigned)  Question Answer Comment  Place call to: [facility]   Reason for Consult Admit      08/13/23 2116                               Transition of care consulted                                     Consults called: none  Admission status:  ED Disposition     ED Disposition  Admit   Condition  --   Comment  Hospital Area: [facility] [[id number]]  Level of Care: Progressive [102]  Admit to Progressive based on following criteria: MULTISYSTEM THREATS such as stable sepsis, metabolic/electrolyte imbalance with or without encephalopathy that is responding to early treatment.  May place patient in observation at [facility] or [facility] if equivalent level of care is available:: No  Covid Evaluation: Asymptomatic - no recent exposure (last 10 days) testing not required  Diagnosis: COPD exacerbation [facility]) [[id number]]   Admitting Physician: Laityn Bensen [[id number]]  Attending Physician: Kristee Angus [[id number]]           Obs     Level of care  progressive       Verlin Duke 08/13/2023, 11:04 PM    [facility]     after 2 AM please page floor coverage   If 7AM-7PM, please contact the day team taking care of the patient using [email or web address]

## 2023-08-13 NOTE — ED Notes (Signed)
 Patient given sandwich bag and cola drink.

## 2023-08-13 NOTE — Assessment & Plan Note (Addendum)
 In the setting of recent pneumonia.  Patient required intubation currently patient not requiring any oxygen .  Continue to monitor

## 2023-08-13 NOTE — Progress Notes (Signed)
 Patient insists on leaving AMA stating he had something to take care of. Teaching provided and encouraged to remain until more stable. Patient is having auditory expiratory wheezes but states this is normal for him. PIV x 2 removed as noted and direct pressure applied till no s/s hemorrhage.  Belongings returned to patient and he ambulated off the unit.

## 2023-08-13 NOTE — Assessment & Plan Note (Signed)
>>  ASSESSMENT AND PLAN FOR COPD EXACERBATION (HCC) WRITTEN ON 08/13/2023 10:58 PM BY DOUTOVA, ANASTASSIA, MD   -  - Will initiate: Steroid taper  -  Antibiotics Rocephin  - Albuterol   PRN, - scheduled duoneb,     -  Mucinex .  Titrate O2 to saturation >90%. Follow patients respiratory status.     VBG no evidence of hypercarbia    Currently mentating well no evidence of symptomatic hypercarbia

## 2023-08-13 NOTE — Assessment & Plan Note (Signed)
 Lopressor  25 mg p.o. twice daily   Lipitor  40 mg daily

## 2023-08-13 NOTE — Assessment & Plan Note (Signed)
 Patient had had his metoprolol  restarted transition back to Cardizem  p.o. when able

## 2023-08-13 NOTE — ED Provider Notes (Signed)
 [place] [facility] PROGRESSIVE CARE Provider Note  CSN: [id number] Arrival date & time: 08/13/23 1848  Chief Complaint(s) Chest Pain and Shortness of Breath  HPI Francisco [name] is a 63 y.o. male history of COPD, CHF, hepatitis C, prior stroke presenting to the emergency department shortness of breath.  The patient was just admitted for COPD exacerbation.  He reports that he left today because "I had something to do".  He reports that he completed this and he is willing to stay in the hospital now if necessary.  He reports that he had some chest tightness today, shortness of breath was acutely worsening after ambulating.  Reports associated cough with clear sputum.  No fevers or chills.  No back pain.  No abdominal pain.  No leg swelling.  Has nebulizer machine at home, tried to use this, but symptoms were persistent so he called EMS who brought him to the ER.   Past Medical History Past Medical History:  Diagnosis Date   CHF (congestive heart failure) (HCC)    COPD (chronic obstructive pulmonary disease) (HCC)    COPD with acute exacerbation (HCC) 10/21/2021   Coronary artery disease    a. s/p prior PCI.   Hepatitis-C    Hypertension    IV drug abuse (HCC)    a. 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   Medically noncompliant    MI (myocardial infarction) (HCC)    Tobacco abuse    Patient Active Problem List   Diagnosis Date Noted   COPD exacerbation (HCC) 08/13/2023   Paroxysmal atrial fibrillation with RVR (HCC) 08/13/2023   Hypokalemia 08/13/2023   Malnutrition of moderate degree 08/10/2023   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 hypercapnic respiratory failure (HCC) 08/09/2023   Seasonal allergies 07/29/2023   TIA (transient ischemic attack) 02/14/2023   Benzodiazepine causing adverse effect in therapeutic use 02/14/2023   History of stroke 02/13/2023   Pulmonary hypertension (HCC) 02/05/2023   Fatigue 09/28/2022   Right knee pain 08/26/2022   Patient cannot afford medications  05/05/2022   Weight loss 02/19/2022   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 Authorizing Provider  albuterol  (PROVENTIL ) (2.5 MG/3ML) 0.083% nebulizer solution Use 1 vial (2.5 mg total) by nebulization every 6 (six) hours as needed for wheezing or shortness of breath. 06/16/23   Atway, Rayann N, DO  albuterol  (VENTOLIN  HFA) 108 (90 Base) MCG/ACT inhaler Inhale 2 puffs into the lungs every 4 (four) hours as needed for wheezing or shortness of breath. Patient taking differently: Inhale 2 puffs into the lungs See admin instructions. Inhale 2 puffs into the lungs every 1-2 hours as needed for shortness of breath or wheezing 06/10/23   Nooruddin, Saad, MD  amLODipine  (NORVASC ) 5 MG tablet Take 1 tablet (5 mg total) by mouth daily. 08/09/23   [name], [name], DO  aspirin  EC 81 MG tablet Take 1 tablet (81 mg total) by mouth daily. Swallow whole. 02/15/23   Brayton Calin, MD  atorvastatin  (LIPITOR ) 40 MG tablet  Take 1 tablet (40 mg total) by mouth daily. 07/06/23   [name], [name], DO  fluticasone  (FLONASE ) 50 MCG/ACT nasal spray Place 1 spray into both nostrils daily. Patient taking differently: Place 1 spray into both nostrils 2 (two) times daily as needed for allergies or rhinitis. 07/28/23   [name], [name], DO  Fluticasone -Umeclidin-Vilant (TRELEGY ELLIPTA ) 100-62.5-25 MCG/ACT AEPB Inhale 1 puff into the lungs daily. Patient taking differently:  Inhale 2 puffs into the lungs 2 (two) times daily. 07/28/23 09/02/23  [name], [name], DO  lisinopril  (ZESTRIL ) 40 MG tablet Take 1/2 tablet (20 mg total) by mouth daily. 06/08/23   Amoako, Prince, MD  loratadine  (CLARITIN ) 10 MG tablet Take 1 tablet (10 mg total) by mouth daily. 07/28/23 07/27/24  [name], [name], DO  nicotine  (NICODERM CQ  - DOSED IN MG/24 HOURS) 14 mg/24hr patch Place 1 patch (14 mg total) onto the skin daily. 07/28/23   [name], [name], DO  nitroGLYCERIN  (NITROSTAT ) 0.4 MG SL tablet Place 1 tablet (0.4 mg total) under the tongue every 5 (five) minutes x 3 doses as needed for chest pain. 10/14/22   Nooruddin, Saad, MD                                                                                                                                    Past Surgical History Past Surgical History:  Procedure Laterality Date   BACK SURGERY     NECK SURGERY     RIGHT/LEFT HEART CATH AND CORONARY ANGIOGRAPHY N/A 04/30/2019   Procedure: RIGHT/LEFT HEART CATH AND CORONARY ANGIOGRAPHY;  Surgeon: Arleen Lacer, MD;  Location: [facility] INVASIVE CV LAB;  Service: Cardiovascular;  Laterality: N/A;   Family History Family History  Problem Relation Age of Onset   Rheum arthritis Mother    Heart disease Paternal Grandfather     Social History Social History   Tobacco Use   Smoking status: Every Day    Current packs/day: 0.50    Types: Cigarettes    Passive exposure: Current   Smokeless tobacco: Never  Vaping Use   Vaping status: Never Used  Substance Use Topics   Alcohol use: Never   Drug use: Yes    Types: Methamphetamines    Comment: Last use 10/14 per pt   Allergies Ivp dye [iodinated contrast media], Tramadol, and Doxycycline   Review of Systems Review of Systems  All other systems reviewed and are negative.   Physical Exam Vital Signs  I have reviewed the triage vital signs BP 123/80 (BP Location: Left Arm)   Pulse 64   Temp 97.7 F (36.5 C) (Oral)   Resp 17   Ht 6'  (1.829 m)   Wt 80.2 kg   SpO2 95%   BMI 23.98 kg/m  Physical Exam Vitals and nursing note reviewed.  Constitutional:      General: He is not in acute distress.    Appearance: Normal appearance.  HENT:  Mouth/Throat:     Mouth: Mucous membranes are moist.  Eyes:     Conjunctiva/sclera: Conjunctivae normal.  Cardiovascular:     Rate and Rhythm: Normal rate and regular rhythm.  Pulmonary:     Effort: Tachypnea and accessory muscle usage present. No respiratory distress.     Breath sounds: Examination of the right-upper field reveals wheezing. Examination of the left-upper field reveals wheezing. Examination of the right-middle field reveals wheezing. Examination of the left-middle field reveals wheezing. Examination of the right-lower field reveals wheezing. Examination of the left-lower field reveals wheezing. Wheezing present.  Abdominal:     General: Abdomen is flat.     Palpations: Abdomen is soft.     Tenderness: There is no abdominal tenderness.  Musculoskeletal:     Right lower leg: No edema.     Left lower leg: No edema.  Skin:    General: Skin is warm and dry.     Capillary Refill: Capillary refill takes less than 2 seconds.  Neurological:     Mental Status: He is alert and oriented to person, place, and time. Mental status is at baseline.  Psychiatric:        Mood and Affect: Mood normal.        Behavior: Behavior normal.     ED Results and Treatments Labs (all labs ordered are listed, but only abnormal results are displayed) Labs Reviewed  COMPREHENSIVE METABOLIC PANEL WITH GFR - Abnormal; Notable for the following components:      Result Value   Potassium 3.4 (*)    Glucose, Bld 173 (*)    Calcium  8.8 (*)    Total Protein 5.7 (*)    Albumin  2.9 (*)    All other components within normal limits  CBC WITH DIFFERENTIAL/PLATELET - Abnormal; Notable for the following components:   WBC 11.7 (*)    Monocytes Absolute 1.6 (*)    Abs Immature Granulocytes 0.16  (*)    All other components within normal limits  BRAIN NATRIURETIC PEPTIDE - Abnormal; Notable for the following components:   B Natriuretic Peptide 393.0 (*)    All other components within normal limits  RAPID URINE DRUG SCREEN, HOSP PERFORMED - Abnormal; Notable for the following components:   Benzodiazepines POSITIVE (*)    Amphetamines POSITIVE (*)    All other components within normal limits  PHOSPHORUS - Abnormal; Notable for the following components:   Phosphorus 2.0 (*)    All other components within normal limits  COMPREHENSIVE METABOLIC PANEL WITH GFR - Abnormal; Notable for the following components:   Glucose, Bld 151 (*)    Calcium  8.8 (*)    Total Protein 5.9 (*)    Albumin  2.8 (*)    All other components within normal limits  CBC - Abnormal; Notable for the following components:   WBC 10.8 (*)    All other components within normal limits  I-STAT VENOUS BLOOD GAS, ED - Abnormal; Notable for the following components:   pO2, Ven 51 (*)    Bicarbonate 30.9 (*)    Acid-Base Excess 5.0 (*)    Potassium 3.3 (*)    All other components within normal limits  TROPONIN I (HIGH SENSITIVITY) - Abnormal; Notable for the following components:   Troponin I (High Sensitivity) 28 (*)    All other components within normal limits  TROPONIN I (HIGH SENSITIVITY) - Abnormal; Notable for the following components:   Troponin I (High Sensitivity) 24 (*)    All other components within normal limits  MRSA NEXT GEN BY PCR, NASAL  EXPECTORATED SPUTUM ASSESSMENT W GRAM STAIN, RFLX TO RESP C  CK  MAGNESIUM   PROCALCITONIN  MAGNESIUM   PHOSPHORUS  STREP PNEUMONIAE URINARY ANTIGEN  LEGIONELLA PNEUMOPHILA SEROGP 1 UR AG                                                                                                                          Radiology DG Chest Portable 1 View Result Date: 08/13/2023 CLINICAL DATA:  Chest pain EXAM: PORTABLE CHEST 1 VIEW COMPARISON:  08/09/2023 FINDINGS: Lungs  are clear.  No pleural effusion or pneumothorax. The heart is normal in size. IMPRESSION: No acute cardiopulmonary disease. Electronically Signed   By: Zadie Herter M.D.   On: 08/13/2023 22:36    Pertinent labs & imaging results that were available during my care of the patient were reviewed by me and considered in my medical decision making (see MDM for details).  Medications Ordered in ED Medications  diltiazem  (CARDIZEM ) 1 mg/mL load via infusion 10 mg (10 mg Intravenous Bolus from Bag 08/13/23 2022)    And  diltiazem  (CARDIZEM ) 125 mg in dextrose  5% 125 mL (1 mg/mL) infusion (0 mg/hr Intravenous Stopped 08/14/23 1133)  predniSONE  (DELTASONE ) tablet 40 mg (40 mg Oral Given 08/14/23 0802)  nicotine  (NICODERM CQ  - dosed in mg/24 hours) patch 14 mg (14 mg Transdermal Not Given 08/14/23 1047)  metoprolol  tartrate (LOPRESSOR ) tablet 25 mg (25 mg Oral Given 08/14/23 0801)  atorvastatin  (LIPITOR ) tablet 40 mg (40 mg Oral Given 08/14/23 0802)  budeson-glycopyrrolate -formoterol  (BREZTRI ) 160-9-4.8 MCG/ACT inhaler 2 puff (2 puffs Inhalation Given 08/14/23 0759)  acetaminophen  (TYLENOL ) tablet 650 mg (has no administration in time range)    Or  acetaminophen  (TYLENOL ) suppository 650 mg (has no administration in time range)  HYDROcodone -acetaminophen  (NORCO/VICODIN) 5-325 MG per tablet 1-2 tablet (has no administration in time range)  ondansetron  (ZOFRAN ) tablet 4 mg (has no administration in time range)    Or  ondansetron  (ZOFRAN ) injection 4 mg (has no administration in time range)  apixaban  (ELIQUIS ) tablet 5 mg (5 mg Oral Given 08/14/23 0801)  guaiFENesin  (MUCINEX ) 12 hr tablet 600 mg (600 mg Oral Given 08/14/23 0801)  morphine  (PF) 2 MG/ML injection 2 mg (2 mg Intravenous Given 08/14/23 1202)  nitroGLYCERIN  (NITROSTAT ) SL tablet 0.4 mg (0.4 mg Sublingual Given 08/14/23 0051)  chlorpheniramine-HYDROcodone  (TUSSIONEX) 10-8 MG/5ML suspension 5 mL (has no administration in time range)  cefTRIAXone   (ROCEPHIN ) 2 g in sodium chloride  0.9 % 100 mL IVPB (has no administration in time range)  diltiazem  (CARDIZEM ) tablet 60 mg (60 mg Oral Given 08/14/23 1132)  levalbuterol  (XOPENEX ) nebulizer solution 0.63 mg (has no administration in time range)    And  ipratropium (ATROVENT ) nebulizer solution 0.5 mg (has no administration in time range)  azithromycin  (ZITHROMAX ) tablet 500 mg (500 mg Oral Given 08/14/23 1132)  thiamine  (VITAMIN B1) tablet 100 mg (100 mg Oral Given 08/14/23 1132)  ipratropium-albuterol  (DUONEB) 0.5-2.5 (3) MG/3ML nebulizer solution  3 mL (3 mLs Nebulization Given 08/13/23 2016)  methylPREDNISolone  sodium succinate (SOLU-MEDROL ) 125 mg/2 mL injection 125 mg (125 mg Intravenous Given 08/13/23 2017)  potassium chloride  SA (KLOR-CON  M) CR tablet 40 mEq (40 mEq Oral Given 08/13/23 2321)  cefTRIAXone  (ROCEPHIN ) 2 g in sodium chloride  0.9 % 100 mL IVPB (2 g Intravenous New Bag/Given 08/14/23 0543)                                                                                                                                     Procedures .Critical Care  Performed by: Mordecai Applebaum, MD Authorized by: Mordecai Applebaum, MD   Critical care provider statement:    Critical care time (minutes):  30   Critical care was necessary to treat or prevent imminent or life-threatening deterioration of the following conditions:  Cardiac failure   Critical care was time spent personally by me on the following activities:  Development of treatment plan with patient or surrogate, discussions with consultants, evaluation of patient's response to treatment, examination of patient, ordering and review of laboratory studies, ordering and review of radiographic studies, ordering and performing treatments and interventions, pulse oximetry, re-evaluation of patient's condition and review of old charts   Care discussed with: admitting provider     (including critical care time)  Medical Decision Making /  ED Course   MDM:  63 year old presenting to the emergency department with shortness of breath.  Patient in mild distress due to respiratory status.  He has mild increased work of breathing, mild diffuse wheezing.  Not hypoxic.  Suspect ongoing COPD exacerbation.  Will give additional steroids, breathing treatment.  He does endorse chest pain, suspect less likely due to ACS, EKG with A-fib with RVR, will check troponin.  Obtain chest x-ray to evaluate for alternative process such as pneumonia, pneumothorax.  Started on diltiazem  given A-fib with RVR.  Suspect patient may need readmission to the hospital and he is willing to stay if necessary.  Clinical Course as of 08/14/23 1218  Sat Aug 13, 2023  2300 Patient admitted to hospitalist for further management [WS]    Clinical Course User Index [WS] Mordecai Applebaum, MD     Additional history obtained: -Additional history obtained from ems -External records from outside source obtained and reviewed including: Chart review including previous notes, labs, imaging, consultation notes including prior notes    Lab Tests: -I ordered, reviewed, and interpreted labs.   The pertinent results include:   Labs Reviewed  COMPREHENSIVE METABOLIC PANEL WITH GFR - Abnormal; Notable for the following components:      Result Value   Potassium 3.4 (*)    Glucose, Bld 173 (*)    Calcium  8.8 (*)    Total Protein 5.7 (*)    Albumin  2.9 (*)    All other components within normal limits  CBC WITH DIFFERENTIAL/PLATELET - Abnormal; Notable for the following components:  WBC 11.7 (*)    Monocytes Absolute 1.6 (*)    Abs Immature Granulocytes 0.16 (*)    All other components within normal limits  BRAIN NATRIURETIC PEPTIDE - Abnormal; Notable for the following components:   B Natriuretic Peptide 393.0 (*)    All other components within normal limits  RAPID URINE DRUG SCREEN, HOSP PERFORMED - Abnormal; Notable for the following components:    Benzodiazepines POSITIVE (*)    Amphetamines POSITIVE (*)    All other components within normal limits  PHOSPHORUS - Abnormal; Notable for the following components:   Phosphorus 2.0 (*)    All other components within normal limits  COMPREHENSIVE METABOLIC PANEL WITH GFR - Abnormal; Notable for the following components:   Glucose, Bld 151 (*)    Calcium  8.8 (*)    Total Protein 5.9 (*)    Albumin  2.8 (*)    All other components within normal limits  CBC - Abnormal; Notable for the following components:   WBC 10.8 (*)    All other components within normal limits  I-STAT VENOUS BLOOD GAS, ED - Abnormal; Notable for the following components:   pO2, Ven 51 (*)    Bicarbonate 30.9 (*)    Acid-Base Excess 5.0 (*)    Potassium 3.3 (*)    All other components within normal limits  TROPONIN I (HIGH SENSITIVITY) - Abnormal; Notable for the following components:   Troponin I (High Sensitivity) 28 (*)    All other components within normal limits  TROPONIN I (HIGH SENSITIVITY) - Abnormal; Notable for the following components:   Troponin I (High Sensitivity) 24 (*)    All other components within normal limits  MRSA NEXT GEN BY PCR, NASAL  EXPECTORATED SPUTUM ASSESSMENT W GRAM STAIN, RFLX TO RESP C  CK  MAGNESIUM   PROCALCITONIN  MAGNESIUM   PHOSPHORUS  STREP PNEUMONIAE URINARY ANTIGEN  LEGIONELLA PNEUMOPHILA SEROGP 1 UR AG    Notable for mild troponin elevation likely demand related  EKG  Afib with RVR    Imaging Studies ordered: I ordered imaging studies including CXR On my interpretation imaging demonstrates no acute process I independently visualized and interpreted imaging. I agree with the radiologist interpretation   Medicines ordered and prescription drug management: Meds ordered this encounter  Medications   AND Linked Order Group    diltiazem  (CARDIZEM ) 1 mg/mL load via infusion 10 mg    diltiazem  (CARDIZEM ) 125 mg in dextrose  5% 125 mL (1 mg/mL) infusion    ipratropium-albuterol  (DUONEB) 0.5-2.5 (3) MG/3ML nebulizer solution 3 mL   methylPREDNISolone  sodium succinate (SOLU-MEDROL ) 125 mg/2 mL injection 125 mg    IV methylprednisolone  will be converted to either a q12h or q24h frequency with the same total daily dose (TDD).  Ordered Dose: 1 to 125 mg TDD; convert to: TDD q24h.  Ordered Dose: 126 to 250 mg TDD; convert to: TDD div q12h.  Ordered Dose: >250 mg TDD; DAW.   predniSONE  (DELTASONE ) tablet 40 mg   DISCONTD: thiamine  (VITAMIN B1) injection 100 mg   nicotine  (NICODERM CQ  - dosed in mg/24 hours) patch 14 mg   metoprolol  tartrate (LOPRESSOR ) tablet 25 mg   DISCONTD: albuterol  (PROVENTIL ) (2.5 MG/3ML) 0.083% nebulizer solution 2.5 mg   atorvastatin  (LIPITOR ) tablet 40 mg   budeson-glycopyrrolate -formoterol  (BREZTRI ) 160-9-4.8 MCG/ACT inhaler 2 puff   DISCONTD: 0.9 %  sodium chloride  infusion   OR Linked Order Group    acetaminophen  (TYLENOL ) tablet 650 mg    acetaminophen  (TYLENOL ) suppository 650 mg   HYDROcodone -acetaminophen  (  NORCO/VICODIN) 5-325 MG per tablet 1-2 tablet   OR Linked Order Group    ondansetron  (ZOFRAN ) tablet 4 mg    ondansetron  (ZOFRAN ) injection 4 mg   apixaban  (ELIQUIS ) tablet 5 mg   guaiFENesin  (MUCINEX ) 12 hr tablet 600 mg   DISCONTD: cefTRIAXone  (ROCEPHIN ) 2 g in sodium chloride  0.9 % 100 mL IVPB    Antibiotic Indication::   CAP   DISCONTD: azithromycin  (ZITHROMAX ) 500 mg in sodium chloride  0.9 % 250 mL IVPB    Antibiotic Indication::   CAP   potassium chloride  SA (KLOR-CON  M) CR tablet 40 mEq   morphine  (PF) 2 MG/ML injection 2 mg   nitroGLYCERIN  (NITROSTAT ) SL tablet 0.4 mg   DISCONTD: guaiFENesin  (MUCINEX ) 12 hr tablet 600 mg   chlorpheniramine-HYDROcodone  (TUSSIONEX) 10-8 MG/5ML suspension 5 mL   DISCONTD: influenza vac split trivalent PF (FLULAVAL) injection 0.5 mL   cefTRIAXone  (ROCEPHIN ) 2 g in sodium chloride  0.9 % 100 mL IVPB    Antibiotic Indication::   CAP   cefTRIAXone  (ROCEPHIN ) 2 g in sodium  chloride 0.9 % 100 mL IVPB    Antibiotic Indication::   CAP   diltiazem  (CARDIZEM ) tablet 60 mg   AND Linked Order Group    levalbuterol  (XOPENEX ) nebulizer solution 0.63 mg    ipratropium (ATROVENT ) nebulizer solution 0.5 mg   azithromycin  (ZITHROMAX ) tablet 500 mg   thiamine  (VITAMIN B1) tablet 100 mg    -I have reviewed the patients home medicines and have made adjustments as needed   Consultations Obtained: I requested consultation with the hospitalist,  and discussed lab and imaging findings as well as pertinent plan - they recommend: admission   Cardiac Monitoring: The patient was maintained on a cardiac monitor.  I personally viewed and interpreted the cardiac monitored which showed an underlying rhythm of: afib  Social Determinants of Health:  Diagnosis or treatment significantly limited by social determinants of health: current smoker   Reevaluation: After the interventions noted above, I reevaluated the patient and found that their symptoms have improved  Co morbidities that complicate the patient evaluation  Past Medical History:  Diagnosis Date   CHF (congestive heart failure) (HCC)    COPD (chronic obstructive pulmonary disease) (HCC)    COPD with acute exacerbation (HCC) 10/21/2021   Coronary artery disease    a. s/p prior PCI.   Hepatitis-C    Hypertension    IV drug abuse (HCC)    a. 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   Medically noncompliant    MI (myocardial infarction) (HCC)    Tobacco abuse       Dispostion: Disposition decision including need for hospitalization was considered, and patient admitted to the hospital.    Final Clinical Impression(s) / ED Diagnoses Final diagnoses:  COPD exacerbation (HCC)  Atrial fibrillation with RVR (HCC)     This chart was dictated using voice recognition software.  Despite best efforts to proofread,  errors can occur which can change the documentation meaning.    Mordecai Applebaum,  MD 08/14/23 1218

## 2023-08-13 NOTE — Assessment & Plan Note (Signed)
>>  ASSESSMENT AND PLAN FOR TOBACCO ABUSE WRITTEN ON 08/13/2023 11:00 PM BY DOUTOVA, ANASTASSIA, MD   - Spoke about importance of quitting spent 5 minutes discussing options for treatment, prior attempts at quitting, and dangers of smoking  -At this point patient is    interested in quitting  - order nicotine  patch   - nursing tobacco cessation protocol

## 2023-08-13 NOTE — Assessment & Plan Note (Signed)
-   will replace electrolytes and repeat  check Mg, phos and Ca level and replace as needed Monitor on telemetry   Lab Results  Component Value Date   K 3.3 (L) 08/13/2023     Lab Results  Component Value Date   CREATININE 0.99 08/13/2023   Lab Results  Component Value Date   MG 2.1 08/13/2023   Lab Results  Component Value Date   CALCIUM  8.8 (L) 08/13/2023   PHOS 3.0 08/10/2023

## 2023-08-13 NOTE — Assessment & Plan Note (Addendum)
-  -   Will initiate: Steroid taper  -  Antibiotics Rocephin  - Albuterol   PRN, - scheduled duoneb,     -  Mucinex .  Titrate O2 to saturation >90%. Follow patients respiratory status.     VBG no evidence of hypercarbia    Currently mentating well no evidence of symptomatic hypercarbia

## 2023-08-13 NOTE — Assessment & Plan Note (Signed)
-  Spoke about importance of quitting spent 5 minutes discussing options for treatment, prior attempts at quitting, and dangers of smoking ? -At this point patient is    interested in quitting ? - order nicotine patch  ? - nursing tobacco cessation protocol ? ?

## 2023-08-13 NOTE — Subjective & Objective (Signed)
 Patient was just admitted to [facility] and left AMA today. History of COPD CAD hypertension That was originally admitted on 20 seconds for increased work of breathing Had to be intubated for respiratory failure. Was admitted to the ICU for pneumonia. Also developed atrial fibrillation with RVR. There was also concern for seizure activity though EEG has been negative. Transferred to the floor.  Found to have pneumonia treated with ceftriaxone  azithromycin  and prednisone  Patient was still hypoxic at the time of the examination" setting up home oxygen  which was not unable to be done because he left  Had an episode of a fever of RVR on 24 April secondary to respiratory distress no prior history.  Initially required Cardizem  infusion and transition to oral Cardizem  and metoprolol  echogram ordered but was not completed TSH normal  History of prior CVA he was started on Eliquis   Initially on prior admission patient thought there may have had some seizure activity prior to presentation EEG was done and unremarkable no further seizure was noted. Today patient elected to go home he was told that he still had significant tachycardia and that he puts his life at risk for leaving you chose to leave AMA.  Shortly after going home patient reported chest pain shortness of breath and nausea EMS was called and they brought him back to emergency department heart rate 112 blood pressure 140/78 he was doing given 20 mg of Cardizem  given albuterol  and Atrovent  treatment given a dose of aspirin  324 Patient did endorse chest tightness when he went home.  Still has cough with clear sputum. Patient significantly wheezy when he came back

## 2023-08-13 NOTE — Assessment & Plan Note (Signed)
 Repeat echogram currently appears to be slightly on the dry side

## 2023-08-13 NOTE — Assessment & Plan Note (Signed)
 Given patient recently been admitted for pneumonia and requiring intubation.  Will restart Rocephin  and azithromycin                   Provide oxygen  as needed.

## 2023-08-13 NOTE — Progress Notes (Signed)
  Echocardiogram 2D Echocardiogram has been attempted.  Francisco [name] 08/13/2023, 9:49 AM

## 2023-08-13 NOTE — Assessment & Plan Note (Addendum)
 Restarted Eliquis .  Patient has been started on diltiazem  drip while in emergency department will continue for now until heart rate under better control. Strongly encourage patient to discontinue amphetamine use Echo in Am  Tsh was checked prior

## 2023-08-14 ENCOUNTER — Observation Stay (HOSPITAL_COMMUNITY)

## 2023-08-14 DIAGNOSIS — J441 Chronic obstructive pulmonary disease with (acute) exacerbation: Secondary | ICD-10-CM | POA: Diagnosis not present

## 2023-08-14 DIAGNOSIS — J9612 Chronic respiratory failure with hypercapnia: Secondary | ICD-10-CM | POA: Diagnosis not present

## 2023-08-14 DIAGNOSIS — I4892 Unspecified atrial flutter: Secondary | ICD-10-CM

## 2023-08-14 DIAGNOSIS — J9601 Acute respiratory failure with hypoxia: Secondary | ICD-10-CM | POA: Diagnosis not present

## 2023-08-14 DIAGNOSIS — I4891 Unspecified atrial fibrillation: Secondary | ICD-10-CM | POA: Diagnosis not present

## 2023-08-14 DIAGNOSIS — I480 Paroxysmal atrial fibrillation: Secondary | ICD-10-CM | POA: Diagnosis not present

## 2023-08-14 HISTORY — DX: Unspecified atrial flutter: I48.92

## 2023-08-14 LAB — COMPREHENSIVE METABOLIC PANEL WITH GFR
ALT: 34 U/L (ref 0–44)
AST: 21 U/L (ref 15–41)
Albumin: 2.8 g/dL — ABNORMAL LOW (ref 3.5–5.0)
Alkaline Phosphatase: 59 U/L (ref 38–126)
Anion gap: 9 (ref 5–15)
BUN: 12 mg/dL (ref 8–23)
CO2: 29 mmol/L (ref 22–32)
Calcium: 8.8 mg/dL — ABNORMAL LOW (ref 8.9–10.3)
Chloride: 101 mmol/L (ref 98–111)
Creatinine, Ser: 0.85 mg/dL (ref 0.61–1.24)
GFR, Estimated: >60 mL/min (ref >60)
Glucose, Bld: 151 mg/dL — ABNORMAL HIGH (ref 70–99)
Potassium: 4.7 mmol/L (ref 3.5–5.1)
Sodium: 139 mmol/L (ref 135–145)
Total Bilirubin: 0.3 mg/dL (ref 0.0–1.2)
Total Protein: 5.9 g/dL — ABNORMAL LOW (ref 6.5–8.1)

## 2023-08-14 LAB — ECHOCARDIOGRAM COMPLETE
AR max vel: 1.75 cm2
AV Peak grad: 3.5 mmHg
Ao pk vel: 0.93 m/s
Area-P 1/2: 3.91 cm2
Height: 72 in
S' Lateral: 2.1 cm
Weight: 2828.94 [oz_av]

## 2023-08-14 LAB — CBC
HCT: 41.1 % (ref 39.0–52.0)
Hemoglobin: 13.4 g/dL (ref 13.0–17.0)
MCH: 31.1 pg (ref 26.0–34.0)
MCHC: 32.6 g/dL (ref 30.0–36.0)
MCV: 95.4 fL (ref 80.0–100.0)
Platelets: 319 10*3/uL (ref 150–400)
RBC: 4.31 MIL/uL (ref 4.22–5.81)
RDW: 14 % (ref 11.5–15.5)
WBC: 10.8 10*3/uL — ABNORMAL HIGH (ref 4.0–10.5)
nRBC: 0 % (ref 0.0–0.2)

## 2023-08-14 LAB — MRSA NEXT GEN BY PCR, NASAL: MRSA by PCR Next Gen: NOT DETECTED

## 2023-08-14 LAB — STREP PNEUMONIAE URINARY ANTIGEN: Strep Pneumo Urinary Antigen: NEGATIVE

## 2023-08-14 LAB — MAGNESIUM: Magnesium: 1.9 mg/dL (ref 1.7–2.4)

## 2023-08-14 LAB — PHOSPHORUS: Phosphorus: 3.2 mg/dL (ref 2.5–4.6)

## 2023-08-14 MED ORDER — THIAMINE MONONITRATE 100 MG PO TABS
100.0000 mg | ORAL_TABLET | Freq: Every day | ORAL | Status: DC
  Administered 2023-08-14: 100 mg via ORAL
  Filled 2023-08-14 (×2): qty 1

## 2023-08-14 MED ORDER — APIXABAN 5 MG PO TABS
5.0000 mg | ORAL_TABLET | Freq: Two times a day (BID) | ORAL | Status: DC
  Administered 2023-08-14 (×2): 5 mg via ORAL
  Filled 2023-08-14 (×3): qty 1

## 2023-08-14 MED ORDER — ONDANSETRON HCL 4 MG/2ML IJ SOLN: 4.0000 mg | Freq: Four times a day (QID) | INTRAMUSCULAR | Status: DC | PRN

## 2023-08-14 MED ORDER — ACETAMINOPHEN 650 MG RE SUPP: 650.0000 mg | Freq: Four times a day (QID) | RECTAL | Status: DC | PRN

## 2023-08-14 MED ORDER — SODIUM CHLORIDE 0.9 % IV SOLN: INTRAVENOUS | Status: DC

## 2023-08-14 MED ORDER — IPRATROPIUM BROMIDE 0.02 % IN SOLN: 0.5000 mg | Freq: Four times a day (QID) | RESPIRATORY_TRACT | Status: DC | PRN

## 2023-08-14 MED ORDER — NITROGLYCERIN 0.4 MG SL SUBL
0.4000 mg | SUBLINGUAL_TABLET | SUBLINGUAL | Status: DC | PRN
  Administered 2023-08-14: 0.4 mg via SUBLINGUAL
  Filled 2023-08-14: qty 1

## 2023-08-14 MED ORDER — DILTIAZEM HCL 60 MG PO TABS
60.0000 mg | ORAL_TABLET | Freq: Three times a day (TID) | ORAL | Status: DC
  Administered 2023-08-14 – 2023-08-15 (×4): 60 mg via ORAL
  Filled 2023-08-14 (×4): qty 1

## 2023-08-14 MED ORDER — MAGNESIUM SULFATE 2 GM/50ML IV SOLN
2.0000 g | Freq: Once | INTRAVENOUS | Status: AC
  Administered 2023-08-14: 2 g via INTRAVENOUS
  Filled 2023-08-14: qty 50

## 2023-08-14 MED ORDER — BUDESON-GLYCOPYRROL-FORMOTEROL 160-9-4.8 MCG/ACT IN AERO
2.0000 | INHALATION_SPRAY | Freq: Two times a day (BID) | RESPIRATORY_TRACT | Status: DC
  Administered 2023-08-14 – 2023-08-15 (×3): 2 via RESPIRATORY_TRACT
  Filled 2023-08-14: qty 5.9

## 2023-08-14 MED ORDER — ONDANSETRON HCL 4 MG PO TABS: 4.0000 mg | ORAL_TABLET | Freq: Four times a day (QID) | ORAL | Status: DC | PRN

## 2023-08-14 MED ORDER — GUAIFENESIN ER 600 MG PO TB12
600.0000 mg | ORAL_TABLET | Freq: Two times a day (BID) | ORAL | Status: DC
  Administered 2023-08-14: 600 mg via ORAL
  Filled 2023-08-14: qty 1

## 2023-08-14 MED ORDER — ACETAMINOPHEN 325 MG PO TABS: 650.0000 mg | ORAL_TABLET | Freq: Four times a day (QID) | ORAL | Status: DC | PRN

## 2023-08-14 MED ORDER — ALBUTEROL SULFATE (2.5 MG/3ML) 0.083% IN NEBU
2.5000 mg | INHALATION_SOLUTION | RESPIRATORY_TRACT | Status: DC | PRN
  Administered 2023-08-14: 2.5 mg via RESPIRATORY_TRACT
  Filled 2023-08-14: qty 3

## 2023-08-14 MED ORDER — MORPHINE SULFATE (PF) 2 MG/ML IV SOLN
2.0000 mg | INTRAVENOUS | Status: DC | PRN
  Administered 2023-08-14 – 2023-08-15 (×6): 2 mg via INTRAVENOUS
  Filled 2023-08-14 (×6): qty 1

## 2023-08-14 MED ORDER — ATORVASTATIN CALCIUM 40 MG PO TABS
40.0000 mg | ORAL_TABLET | Freq: Every day | ORAL | Status: DC
  Administered 2023-08-14: 40 mg via ORAL
  Filled 2023-08-14 (×2): qty 1

## 2023-08-14 MED ORDER — SODIUM CHLORIDE 0.9 % IV SOLN: 2.0000 g | INTRAVENOUS | Status: DC

## 2023-08-14 MED ORDER — LEVALBUTEROL HCL 0.63 MG/3ML IN NEBU
0.6300 mg | INHALATION_SOLUTION | Freq: Four times a day (QID) | RESPIRATORY_TRACT | Status: DC | PRN
  Administered 2023-08-14: 0.63 mg via RESPIRATORY_TRACT
  Filled 2023-08-14: qty 3

## 2023-08-14 MED ORDER — HYDROCODONE-ACETAMINOPHEN 5-325 MG PO TABS
1.0000 | ORAL_TABLET | ORAL | Status: DC | PRN
  Administered 2023-08-14 (×2): 2 via ORAL
  Filled 2023-08-14 (×2): qty 2

## 2023-08-14 MED ORDER — GUAIFENESIN ER 600 MG PO TB12
600.0000 mg | ORAL_TABLET | Freq: Two times a day (BID) | ORAL | Status: DC
  Administered 2023-08-14 (×2): 600 mg via ORAL
  Filled 2023-08-14 (×3): qty 1

## 2023-08-14 MED ORDER — SODIUM CHLORIDE 0.9 % IV SOLN
2.0000 g | INTRAVENOUS | Status: DC
  Administered 2023-08-15: 2 g via INTRAVENOUS
  Filled 2023-08-14: qty 20

## 2023-08-14 MED ORDER — HYDROCOD POLI-CHLORPHE POLI ER 10-8 MG/5ML PO SUER: 5.0000 mL | Freq: Two times a day (BID) | ORAL | Status: DC | PRN

## 2023-08-14 MED ORDER — SODIUM CHLORIDE 0.9 % IV SOLN
500.0000 mg | INTRAVENOUS | Status: DC
  Administered 2023-08-14: 500 mg via INTRAVENOUS
  Filled 2023-08-14: qty 5

## 2023-08-14 MED ORDER — AZITHROMYCIN 500 MG PO TABS
500.0000 mg | ORAL_TABLET | Freq: Every day | ORAL | Status: DC
  Administered 2023-08-14: 500 mg via ORAL
  Filled 2023-08-14 (×2): qty 1

## 2023-08-14 MED ORDER — INFLUENZA VIRUS VACC SPLIT PF (FLUZONE) 0.5 ML IM SUSY: 0.5000 mL | PREFILLED_SYRINGE | INTRAMUSCULAR | Status: DC

## 2023-08-14 MED ORDER — SODIUM CHLORIDE 0.9 % IV SOLN
2.0000 g | Freq: Once | INTRAVENOUS | Status: AC
  Administered 2023-08-14: 2 g via INTRAVENOUS
  Filled 2023-08-14: qty 20

## 2023-08-14 NOTE — Progress Notes (Signed)
 Echocardiogram 2D Echocardiogram has been performed.  Francisco [name] 08/14/2023, 11:23 AM

## 2023-08-14 NOTE — Plan of Care (Signed)
   Problem: Clinical Measurements: Goal: Ability to maintain clinical measurements within normal limits will improve Outcome: Progressing   Problem: Clinical Measurements: Goal: Will remain free from infection Outcome: Progressing

## 2023-08-14 NOTE — Progress Notes (Signed)
 [facility] PROGRESS NOTE   Francisco [name] UJW:[id number] DOB: Jul 24, 1960 DOA: 08/13/2023  PCP: Lanney Pitts, DO  Brief History:  63 y.o. male with medical history significant of COPD, tobacco abuse, new A.fib, CAD, HTN, history of CVA, polysubstance abuse IV drug use who was hospitalized to [facility] on 4/22.  Initially admitted to the ICU where he was intubated for respiratory failure.  Was diagnosed with pneumonia.  Subsequently developed A-fib with RVR.  There was concern for seizure activity though EEG was negative.  He was stabilized and then transferred to the floor.  A-fib was being managed but then patient abruptly decided to leave AGAINST MEDICAL ADVICE on 4/26.  He mentioned that he had to take care of some "business".  Did not specify what he wanted to take care of.  And then he came back to the hospital the very same day in the evening with shortness of breath.  Was found to have tachycardia.  He was subsequently hospitalized for further management.   Consultants: None  Procedures: Echocardiogram is pending    Subjective/Interval History: Patient mentioned that he feels short of breath which worsened when he tried to ambulate.  Continues to have cough with clear expectoration.  Denies any blood in the sputum.  No nausea vomiting.  Denies any chest pain.    Assessment/Plan:  Acute hypoxic and hypercapnic respiratory failure/COPD with acute exacerbation/community-acquired pneumonia/H. influenzae Patient was intubated in the ICU.  Successfully extubated. Patient has completed at least 3 days of antibiotics.  Will give him 2 more days. Procalcitonin less than 0.1.  WBC was 11.7. Noted to be on prednisone  which will be continued.  Noted to be on oxygen .  Check room air saturations when more stable.  Will need ambulatory pulse oximetry as well. Chest x-ray did not show any acute findings.   Atrial fibrillation with RVR He went into atrial fibrillation during  previous hospitalization.  This is a new diagnosis for him. Patient currently on a Cardizem  infusion.  Will be transition to oral Cardizem  this morning.  TSH was noted to be normal.   He is also noted to be on metoprolol . He does report a history of mini stroke previously.  His CHADS2 vascular score is 4.  Patient was started on Eliquis .     Essential hypertension Has experienced hypotension during hospitalization.  Monitor blood pressures closely.   Concern for seizure at presentation No further seizure activity noted.  EEG was unremarkable.   Tobacco abuse disorder Patient counseled.  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  Patient counseled.  Hypokalemia Supplemented.  Recheck labs.   Moderate protein calorie malnutrition Nutrition Problem: Moderate Malnutrition Etiology: chronic illness (COPD)   DVT Prophylaxis: Eliquis  full Code Status: Full code Family Communication: Discussed with patient Disposition Plan: Hopefully return home when stabilized  Status is: Observation The patient will require care spanning > 2 midnights and should be moved to inpatient because: Atrial fibrillation with RVR on Cardizem  infusion      Medications: Scheduled:  apixaban   5 mg Oral BID   atorvastatin   40 mg Oral Daily   budeson-glycopyrrolate -formoterol   2 puff Inhalation BID   guaiFENesin   600 mg Oral BID   metoprolol  tartrate  25 mg Oral BID   nicotine   14 mg Transdermal Daily   predniSONE   40 mg Oral Q breakfast   thiamine  (VITAMIN B1) injection  100 mg Intravenous Daily   Continuous:  sodium chloride  75 mL/hr at 08/14/23 [phone]  azithromycin  500 mg (08/14/23 9562)   [START ON 08/15/2023] cefTRIAXone  (ROCEPHIN )  IV     diltiazem  (CARDIZEM ) infusion 15 mg/hr (08/14/23 0458)   PRN:acetaminophen  **OR** acetaminophen , albuterol , chlorpheniramine-HYDROcodone , HYDROcodone -acetaminophen , morphine  injection, nitroGLYCERIN , ondansetron  **OR** ondansetron  (ZOFRAN )  IV  Antibiotics: Anti-infectives (From admission, onward)    Start     Dose/Rate Route Frequency Ordered Stop   08/15/23 0400  cefTRIAXone  (ROCEPHIN ) 2 g in sodium chloride  0.9 % 100 mL IVPB        2 g 200 mL/hr over 30 Minutes Intravenous Every 24 hours 08/14/23 0407 08/19/23 0359   08/14/23 0600  cefTRIAXone  (ROCEPHIN ) 2 g in sodium chloride  0.9 % 100 mL IVPB  Status:  Discontinued        2 g 200 mL/hr over 30 Minutes Intravenous Every 24 hours 08/14/23 0357 08/14/23 0406   08/14/23 0600  azithromycin  (ZITHROMAX ) 500 mg in sodium chloride  0.9 % 250 mL IVPB        500 mg 250 mL/hr over 60 Minutes Intravenous Every 24 hours 08/14/23 0357 08/19/23 0559   08/14/23 0500  cefTRIAXone  (ROCEPHIN ) 2 g in sodium chloride  0.9 % 100 mL IVPB        2 g 200 mL/hr over 30 Minutes Intravenous  Once 08/14/23 0406 08/14/23 0613       Objective:  Vital Signs  Vitals:   08/14/23 0400 08/14/23 0500 08/14/23 0700 08/14/23 0719  BP: 133/81 121/88 121/76 106/71  Pulse: 83 69 81   Resp:  20 20   Temp:  98.4 F (36.9 C)  98.2 F (36.8 C)  TempSrc:  Oral  Oral  SpO2:  94% 99%   Weight:      Height:        Intake/Output Summary (Last 24 hours) at 08/14/2023 0841 Last data filed at 08/14/2023 0700 Gross per 24 hour  Intake 116.32 ml  Output 955 ml  Net -838.68 ml   Filed Weights   08/13/23 1855 08/13/23 2345  Weight: 78.5 kg 80.2 kg    General appearance: Awake alert.  In no distress Resp: Mildly tachypneic.  Coarse breath sounds bilaterally.  Occasional wheezing is appreciated. Cardio: S1-S2 is irregularly irregular GI: Abdomen is soft.  Nontender nondistended.  Bowel sounds are present normal.  No masses organomegaly Extremities: No edema.  Full range of motion of lower extremities. Neurologic: Alert and oriented x3.  No focal neurological deficits.    Lab Results:  Data Reviewed: I have personally reviewed following labs and reports of the imaging studies  CBC: Recent Labs   Lab 08/09/23 1926 08/09/23 2112 08/09/23 2312 08/10/23 0004 08/10/23 0404 08/12/23 1308 08/13/23 0547 08/13/23 1952 08/13/23 2011  WBC 18.4*  --  12.8*  --  11.6* 13.4* 11.3* 11.7*  --   NEUTROABS 11.4*  --   --   --   --   --   --  7.1  --   HGB 15.8   < > 13.5   < > 13.8 14.0 13.4 13.7 13.9  HCT 48.6   < > 40.2   < > 41.2 44.3 41.1 42.0 41.0  MCV 95.7  --  93.7  --  93.8 97.4 96.0 96.3  --   PLT 406*  --  320  --  330 331 312 290  --    < > = values in this interval not displayed.    Basic Metabolic Panel: Recent Labs  Lab 08/09/23 1926 08/09/23 2112 08/09/23 2312 08/10/23 0004 08/10/23 0404 08/12/23 6578  08/13/23 0547 08/13/23 1952 08/13/23 2011 08/13/23 2153  NA 144   < >  --    < > 142 141 140 143 142  --   K 3.7   < >  --    < > 3.8 4.3 4.1 3.4* 3.3*  --   CL 105  --   --   --  104 105 103 104  --   --   CO2 28  --   --   --  24 27 27 28   --   --   GLUCOSE 104*  --   --   --  158* 97 119* 173*  --   --   BUN 9  --   --   --  12 37* 25* 16  --   --   CREATININE 0.94  --  1.04  --  1.10 1.08 0.91 0.99  --   --   CALCIUM  9.3  --   --   --  8.9 8.8* 8.7* 8.8*  --   --   MG  --   --   --   --  2.1  --  2.1  --   --  1.8  PHOS  --   --   --   --  3.0  --   --   --   --  2.0*   < > = values in this interval not displayed.    GFR: Estimated Creatinine Clearance: 84.9 mL/min (by C-G formula based on SCr of 0.99 mg/dL).  Liver Function Tests: Recent Labs  Lab 08/13/23 1952  AST 20  ALT 28  ALKPHOS 61  BILITOT 0.4  PROT 5.7*  ALBUMIN  2.9*    Cardiac Enzymes: Recent Labs  Lab 08/13/23 2153  CKTOTAL 59    HbA1C: Recent Labs    08/13/23 0547  HGBA1C 5.8*    CBG: Recent Labs  Lab 08/12/23 0748 08/12/23 1138 08/12/23 1623 08/12/23 2052 08/12/23 2324  GLUCAP 83 129* 116* 217* 148*    Thyroid  Function Tests: Recent Labs    08/13/23 0547  TSH 0.485     Recent Results (from the past 240 hours)  Resp panel by RT-PCR (RSV, Flu A&B,  Covid) Anterior Nasal Swab     Status: None   Collection Time: 08/09/23  8:49 PM   Specimen: Anterior Nasal Swab  Result Value Ref Range Status   SARS Coronavirus 2 by RT PCR NEGATIVE NEGATIVE Final   Influenza A by PCR NEGATIVE NEGATIVE Final   Influenza B by PCR NEGATIVE NEGATIVE Final    Comment: (NOTE) The Xpert Xpress SARS-CoV-2/FLU/RSV plus assay is intended as an aid in the diagnosis of influenza from Nasopharyngeal swab specimens and should not be used as a sole basis for treatment. Nasal washings and aspirates are unacceptable for Xpert Xpress SARS-CoV-2/FLU/RSV testing.  Fact Sheet for Patients: BloggerCourse.com  Fact Sheet for Healthcare Providers: SeriousBroker.it  This test is not yet approved or cleared by the [place]  FDA and has been authorized for detection and/or diagnosis of SARS-CoV-2 by FDA under an Emergency Use Authorization (EUA). This EUA will remain in effect (meaning this test can be used) for the duration of the COVID-19 declaration under Section 564(b)(1) of the Act, 21 U.S.C. section 360bbb-3(b)(1), unless the authorization is terminated or revoked.     Resp Syncytial Virus by PCR NEGATIVE NEGATIVE Final    Comment: (NOTE) Fact Sheet for Patients: BloggerCourse.com  Fact Sheet for Healthcare Providers: SeriousBroker.it  This  test is not yet approved or cleared by the [place]  FDA and has been authorized for detection and/or diagnosis of SARS-CoV-2 by FDA under an Emergency Use Authorization (EUA). This EUA will remain in effect (meaning this test can be used) for the duration of the COVID-19 declaration under Section 564(b)(1) of the Act, 21 U.S.C. section 360bbb-3(b)(1), unless the authorization is terminated or revoked.  Performed at [facility], [address], [place], [place] [address]   MRSA Next Gen by PCR, Nasal      Status: None   Collection Time: 08/09/23  9:44 PM   Specimen: Nasal Mucosa; Nasal Swab  Result Value Ref Range Status   MRSA by PCR Next Gen NOT DETECTED NOT DETECTED Final    Comment: (NOTE) The GeneXpert MRSA Assay (FDA approved for NASAL specimens only), is one component of a comprehensive MRSA colonization surveillance program. It is not intended to diagnose MRSA infection nor to guide or monitor treatment for MRSA infections. Test performance is not FDA approved in patients less than 70 years old. Performed at [facility], [address] [place], [place] [address]   Culture, Respiratory w Gram Stain     Status: None   Collection Time: 08/09/23 11:40 PM   Specimen: Tracheal Aspirate  Result Value Ref Range Status   Specimen Description TRACHEAL ASPIRATE  Final   Special Requests NONE  Final   Gram Stain   Final    FEW WBC PRESENT, PREDOMINANTLY PMN MODERATE GRAM NEGATIVE RODS    Culture   Final    ABUNDANT HAEMOPHILUS INFLUENZAE BETA LACTAMASE POSITIVE Performed at [facility], [address] [place], [place] [address]    Report Status 08/12/2023 FINAL  Final  Culture, Respiratory w Gram Stain     Status: None   Collection Time: 08/10/23  5:00 PM   Specimen: Tracheal Aspirate; Respiratory  Result Value Ref Range Status   Specimen Description TRACHEAL ASPIRATE  Final   Special Requests NONE  Final   Gram Stain   Final    ABUNDANT WBC PRESENT, PREDOMINANTLY PMN RARE GRAM NEGATIVE RODS    Culture   Final    ABUNDANT HAEMOPHILUS INFLUENZAE BETA LACTAMASE POSITIVE Performed at [facility], [address] [place], [place] [address]    Report Status 08/12/2023 FINAL  Final  MRSA Next Gen by PCR, Nasal     Status: None   Collection Time: 08/14/23  1:25 AM   Specimen: Nasal Mucosa; Nasal Swab  Result Value Ref Range Status   MRSA by PCR Next Gen NOT DETECTED NOT DETECTED Final    Comment: (NOTE) The GeneXpert MRSA Assay (FDA approved for NASAL  specimens only), is one component of a comprehensive MRSA colonization surveillance program. It is not intended to diagnose MRSA infection nor to guide or monitor treatment for MRSA infections. Test performance is not FDA approved in patients less than 69 years old. Performed at [facility], [address], [place], [place] [address]       Radiology Studies: DG Chest Portable 1 View Result Date: 08/13/2023 CLINICAL DATA:  Chest pain EXAM: PORTABLE CHEST 1 VIEW COMPARISON:  08/09/2023 FINDINGS: Lungs are clear.  No pleural effusion or pneumothorax. The heart is normal in size. IMPRESSION: No acute cardiopulmonary disease. Electronically Signed   By: Zadie Herter M.D.   On: 08/13/2023 22:36       LOS: 0 days   Julieana Eshleman Lyndon Santiago  [facility] Pager on  [email or web address]  08/14/2023, 8:41 AM

## 2023-08-15 ENCOUNTER — Ambulatory Visit: Payer: Self-pay

## 2023-08-15 ENCOUNTER — Encounter (HOSPITAL_COMMUNITY)

## 2023-08-15 ENCOUNTER — Inpatient Hospital Stay (HOSPITAL_COMMUNITY)
Admission: RE | Admit: 2023-08-15 | Discharge: 2023-08-15 | Disposition: A | Discharge status: Home / Self Care | Source: Ambulatory Visit | Attending: Internal Medicine | Admitting: Internal Medicine

## 2023-08-15 ENCOUNTER — Other Ambulatory Visit (HOSPITAL_COMMUNITY): Payer: Self-pay

## 2023-08-15 ENCOUNTER — Other Ambulatory Visit: Payer: Self-pay

## 2023-08-15 DIAGNOSIS — I4891 Unspecified atrial fibrillation: Secondary | ICD-10-CM

## 2023-08-15 LAB — CBC
HCT: 43.5 % (ref 39.0–52.0)
Hemoglobin: 14.2 g/dL (ref 13.0–17.0)
MCH: 31 pg (ref 26.0–34.0)
MCHC: 32.6 g/dL (ref 30.0–36.0)
MCV: 95 fL (ref 80.0–100.0)
Platelets: 330 10*3/uL (ref 150–400)
RBC: 4.58 MIL/uL (ref 4.22–5.81)
RDW: 14.2 % (ref 11.5–15.5)
WBC: 14.7 10*3/uL — ABNORMAL HIGH (ref 4.0–10.5)
nRBC: 0 % (ref 0.0–0.2)

## 2023-08-15 LAB — BASIC METABOLIC PANEL WITH GFR
Anion gap: 12 (ref 5–15)
BUN: 20 mg/dL (ref 8–23)
CO2: 26 mmol/L (ref 22–32)
Calcium: 9.3 mg/dL (ref 8.9–10.3)
Chloride: 98 mmol/L (ref 98–111)
Creatinine, Ser: 0.95 mg/dL (ref 0.61–1.24)
GFR, Estimated: >60 mL/min (ref >60)
Glucose, Bld: 138 mg/dL — ABNORMAL HIGH (ref 70–99)
Potassium: 4.4 mmol/L (ref 3.5–5.1)
Sodium: 136 mmol/L (ref 135–145)

## 2023-08-15 LAB — MAGNESIUM: Magnesium: 2.2 mg/dL (ref 1.7–2.4)

## 2023-08-15 MED ORDER — DILTIAZEM HCL ER COATED BEADS 120 MG PO CP24
120.0000 mg | ORAL_CAPSULE | Freq: Every day | ORAL | 1 refills | Status: DC
  Filled 2023-08-15: qty 30, 30d supply, fill #0
  Filled 2023-09-13: qty 30, 30d supply, fill #1

## 2023-08-15 MED ORDER — DILTIAZEM HCL ER COATED BEADS 120 MG PO CP24
120.0000 mg | ORAL_CAPSULE | Freq: Every day | ORAL | Status: DC
  Filled 2023-08-15: qty 1

## 2023-08-15 MED ORDER — CEFDINIR 300 MG PO CAPS
300.0000 mg | ORAL_CAPSULE | Freq: Two times a day (BID) | ORAL | 0 refills | Status: AC
  Filled 2023-08-15: qty 6, 3d supply, fill #0

## 2023-08-15 MED ORDER — METOPROLOL TARTRATE 25 MG PO TABS
25.0000 mg | ORAL_TABLET | Freq: Two times a day (BID) | ORAL | 1 refills | Status: DC
  Filled 2023-08-15: qty 60, 30d supply, fill #0
  Filled 2023-09-13: qty 60, 30d supply, fill #1

## 2023-08-15 MED ORDER — VITAMIN B-1 100 MG PO TABS
100.0000 mg | ORAL_TABLET | Freq: Every day | ORAL | 1 refills | Status: DC
  Filled 2023-08-15: qty 30, 30d supply, fill #0
  Filled 2023-09-13: qty 30, 30d supply, fill #1

## 2023-08-15 MED ORDER — APIXABAN 5 MG PO TABS
5.0000 mg | ORAL_TABLET | Freq: Two times a day (BID) | ORAL | 2 refills | Status: DC
  Filled 2023-08-15: qty 60, 30d supply, fill #0
  Filled 2023-09-13: qty 60, 30d supply, fill #1
  Filled 2023-10-07: qty 60, 30d supply, fill #2

## 2023-08-15 MED ORDER — PREDNISONE 20 MG PO TABS
ORAL_TABLET | ORAL | 0 refills | Status: AC
  Filled 2023-08-15: qty 18, 9d supply, fill #0

## 2023-08-15 NOTE — Progress Notes (Signed)
 RT NOTE:  Pt was scheduled for inpatient PFT 08/15/23, RT spoke with pt he states he is being discharged and wishes to call us  at [phone] when he is feeling better to reschedule his PFT appt.

## 2023-08-15 NOTE — Plan of Care (Signed)

## 2023-08-15 NOTE — Progress Notes (Signed)
 Patient was told he would discharge today and patient took it as he was able to go right away, patient became irritable waiting in room stating that he was going to rip out his ivs if no one was coming in to discharge him. Patient was told multiple times by staff that we were working on getting patient home O2 setup and patient became more irritable stating that he's going to walk out. MD informed, AMA papers signed and placed in chart, IVS removed, informed patient his prescriptions were sent to his pharmacy, and to follow up with his PCP. Patient walked off unit.

## 2023-08-15 NOTE — Telephone Encounter (Signed)
 Copied from CRM [id number]. Topic: Clinical - Red Word Triage >> Aug 15, 2023  3:39 PM Brittney F wrote: [facility] that prompted transfer to Nurse Triage:   Low oxygen  levels; last read was 88    Chief Complaint: Left hospital AMA today. Request appointment tomorrow. OK to schedule per Lorelee Roger in the practice. Symptoms: SOB Frequency: Today Pertinent Negatives: Patient denies chest pain Disposition: [] ED /[] Urgent Care (no appt availability in office) / [x] Appointment(In office/virtual)/ []  [facility]/ [] Home Care/ [] Refused Recommended Disposition /[] [facility]/ []  Follow-up with PCP Additional Notes: Refuses ED.  Reason for Disposition  [1] MODERATE longstanding difficulty breathing (e.g., speaks in phrases, SOB even at rest, pulse 100-120) AND [2] SAME as normal  Answer Assessment - Initial Assessment Questions 1. RESPIRATORY STATUS: "Describe your breathing?" (e.g., wheezing, shortness of breath, unable to speak, severe coughing)      SOB 2. ONSET: "When did this breathing problem begin?"      Last week 3. PATTERN "Does the difficult breathing come and go, or has it been constant since it started?"      Constant 4. SEVERITY: "How bad is your breathing?" (e.g., mild, moderate, severe)    - MILD: No SOB at rest, mild SOB with walking, speaks normally in sentences, can lie down, no retractions, pulse < 100.    - MODERATE: SOB at rest, SOB with minimal exertion and prefers to sit, cannot lie down flat, speaks in phrases, mild retractions, audible wheezing, pulse 100-120.    - SEVERE: Very SOB at rest, speaks in single words, struggling to breathe, sitting hunched forward, retractions, pulse > 120      Yes 5. RECURRENT SYMPTOM: "Have you had difficulty breathing before?" If Yes, ask: "When was the last time?" and "What happened that time?"      Yes 6. CARDIAC HISTORY: "Do you have any history of heart disease?" (e.g., heart attack, angina, bypass surgery, angioplasty)       Yes 7. LUNG HISTORY: "Do you have any history of lung disease?"  (e.g., pulmonary embolus, asthma, emphysema)     Yes 8. CAUSE: "What do you think is causing the breathing problem?"      COPD, Afib 9. OTHER SYMPTOMS: "Do you have any other symptoms? (e.g., dizziness, runny nose, cough, chest pain, fever)     No 10. O2 SATURATION MONITOR:  "Do you use an oxygen  saturation monitor (pulse oximeter) at home?" If Yes, ask: "What is your reading (oxygen  level) today?" "What is your usual oxygen  saturation reading?" (e.g., 95%)       No 11. PREGNANCY: "Is there any chance you are pregnant?" "When was your last menstrual period?"       N/a 12. TRAVEL: "Have you traveled out of the country in the last month?" (e.g., travel history, exposures)       no  Protocols used: Breathing Difficulty-A-AH

## 2023-08-15 NOTE — Telephone Encounter (Signed)
 Pt has an appt tomorrow 4/29 with Dr Hartwell Linea @ 0845 AM.

## 2023-08-15 NOTE — Discharge Summary (Signed)
 [facility]  Physician Discharge Summary   Patient ID: Francisco [name] MRN: [id number] DOB/AGE: 1960-10-02 63 y.o.  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  RECOMMENDATIONS FOR OUTPATIENT FOLLOW UP: Patient once again left AGAINST MEDICAL ADVICE   INITIAL HISTORY: 63 y.o. male with medical history significant of COPD, tobacco abuse, new A.fib, CAD, HTN, history of CVA, polysubstance abuse IV drug use who was hospitalized to [facility] on 4/22.  Initially admitted to the ICU where he was intubated for respiratory failure.  Was diagnosed with pneumonia.  Subsequently developed A-fib with RVR.  There was concern for seizure activity though EEG was negative.  He was stabilized and then transferred to the floor.  A-fib was being managed but then patient abruptly decided to leave AGAINST MEDICAL ADVICE on 4/26.  He mentioned that he had to take care of some "business".  Did not specify what he wanted to take care of.  And then he came back to the hospital the very same day in the evening with shortness of breath.  Was found to have tachycardia.  He was subsequently hospitalized for further management.   Consultants: None   Procedures: Echocardiogram  HOSPITAL COURSE:   Acute hypoxic and hypercapnic respiratory failure/COPD with acute exacerbation/community-acquired pneumonia/H. influenzae Patient was recently hospitalized and intubated in the ICU.  Successfully extubated. H. influenzae was noted to be positive.  Patient will be discharged on a  few more days of antibiotics.Chest x-ray did not show any acute findings. Plan was to do ambulatory pulse oximetry to see if he would need home oxygen .  However before this could be done patient became irritable and decided to leave AGAINST MEDICAL ADVICE.   Atrial fibrillation with RVR He went into atrial fibrillation during previous hospitalization.  This is a new diagnosis for him. Patient currently on a Cardizem  infusion.  Patient was transition to oral Cardizem .  Echocardiogram shows normal systolic function.  He is also noted to be on metoprolol . He does report a history of mini stroke previously.  His CHADS2 vascular score is 4.  Patient was started on Eliquis .     Essential hypertension  Tobacco abuse disorder Patient counseled.   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  Patient counseled.   Hypokalemia Supplemented.     Moderate protein calorie malnutrition Nutrition Problem: Moderate Malnutrition Etiology: chronic illness (COPD)  Patient is stable.  Before home O2 assessment could be completed he decided to leave AGAINST MEDICAL ADVICE.  Prescriptions have been sent to his pharmacy already.  PLEASE NOTE THAT PATIENT IS FOLLOWED BY IM TEACHING SERVICE.   PERTINENT LABS:  The results of significant diagnostics from this hospitalization (including imaging, microbiology, ancillary and laboratory) are listed below for reference.    Microbiology: Recent Results (from the past 240 hours)  Resp panel by RT-PCR (RSV, Flu A&B, Covid) Anterior Nasal Swab     Status: None   Collection Time: 08/09/23  8:49 PM   Specimen: Anterior Nasal Swab  Result Value Ref Range Status   SARS Coronavirus 2 by RT  PCR NEGATIVE NEGATIVE Final   Influenza A by PCR NEGATIVE NEGATIVE Final   Influenza B by PCR NEGATIVE NEGATIVE Final    Comment: (NOTE) The Xpert Xpress SARS-CoV-2/FLU/RSV plus assay is intended as an aid in the diagnosis of influenza from Nasopharyngeal  swab specimens and should not be used as a sole basis for treatment. Nasal washings and aspirates are unacceptable for Xpert Xpress SARS-CoV-2/FLU/RSV testing.  Fact Sheet for Patients: BloggerCourse.com  Fact Sheet for Healthcare Providers: SeriousBroker.it  This test is not yet approved or cleared by the [place]  FDA and has been authorized for detection and/or diagnosis of SARS-CoV-2 by FDA under an Emergency Use Authorization (EUA). This EUA will remain in effect (meaning this test can be used) for the duration of the COVID-19 declaration under Section 564(b)(1) of the Act, 21 U.S.C. section 360bbb-3(b)(1), unless the authorization is terminated or revoked.     Resp Syncytial Virus by PCR NEGATIVE NEGATIVE Final    Comment: (NOTE) Fact Sheet for Patients: BloggerCourse.com  Fact Sheet for Healthcare Providers: SeriousBroker.it  This test is not yet approved or cleared by the [place]  FDA and has been authorized for detection and/or diagnosis of SARS-CoV-2 by FDA under an Emergency Use Authorization (EUA). This EUA will remain in effect (meaning this test can be used) for the duration of the COVID-19 declaration under Section 564(b)(1) of the Act, 21 U.S.C. section 360bbb-3(b)(1), unless the authorization is terminated or revoked.  Performed at [facility], [address], [place], [place] [address]   MRSA Next Gen by PCR, Nasal     Status: None   Collection Time: 08/09/23  9:44 PM   Specimen: Nasal Mucosa; Nasal Swab  Result Value Ref Range Status   MRSA by PCR Next Gen NOT DETECTED NOT DETECTED Final    Comment: (NOTE) The GeneXpert MRSA Assay (FDA approved for NASAL specimens only), is one component of a comprehensive MRSA colonization surveillance program. It is not intended to diagnose MRSA infection nor to guide or monitor treatment for MRSA  infections. Test performance is not FDA approved in patients less than 7 years old. Performed at [facility], [address] [place], [place] [address]   Culture, Respiratory w Gram Stain     Status: None   Collection Time: 08/09/23 11:40 PM   Specimen: Tracheal Aspirate  Result Value Ref Range Status   Specimen Description TRACHEAL ASPIRATE  Final   Special Requests NONE  Final   Gram Stain   Final    FEW WBC PRESENT, PREDOMINANTLY PMN MODERATE GRAM NEGATIVE RODS    Culture   Final    ABUNDANT HAEMOPHILUS INFLUENZAE BETA LACTAMASE POSITIVE Performed at [facility], [address], [place], [place] [address]    Report Status 08/12/2023 FINAL  Final  Culture, Respiratory w Gram Stain     Status: None   Collection Time: 08/10/23  5:00 PM   Specimen: Tracheal Aspirate; Respiratory  Result Value Ref Range Status   Specimen Description TRACHEAL ASPIRATE  Final   Special Requests NONE  Final   Gram Stain   Final    ABUNDANT WBC PRESENT, PREDOMINANTLY PMN RARE GRAM NEGATIVE RODS    Culture   Final    ABUNDANT HAEMOPHILUS INFLUENZAE BETA LACTAMASE POSITIVE Performed at [facility], [address], [place], [place] [address]    Report Status 08/12/2023 FINAL  Final  MRSA Next Gen by PCR, Nasal     Status: None  Collection Time: 08/14/23  1:25 AM   Specimen: Nasal Mucosa; Nasal Swab  Result Value Ref Range Status   MRSA by PCR Next Gen NOT DETECTED NOT DETECTED Final    Comment: (NOTE) The GeneXpert MRSA Assay (FDA approved for NASAL specimens only), is one component of a comprehensive MRSA colonization surveillance program. It is not intended to diagnose MRSA infection nor to guide or monitor treatment for MRSA infections. Test performance is not FDA approved in patients less than 20 years old. Performed at [facility], [address], [place], [place] [address]      Labs:   Basic Metabolic Panel: Recent Labs  Lab 08/10/23 0404  08/12/23 6213 08/13/23 0547 08/13/23 1952 08/13/23 2011 08/13/23 2153 08/14/23 1024 08/15/23 0303  NA 142 141 140 143 142  --  139 136  K 3.8 4.3 4.1 3.4* 3.3*  --  4.7 4.4  CL 104 105 103 104  --   --  101 98  CO2 24 27 27 28   --   --  29 26  GLUCOSE 158* 97 119* 173*  --   --  151* 138*  BUN 12 37* 25* 16  --   --  12 20  CREATININE 1.10 1.08 0.91 0.99  --   --  0.85 0.95  CALCIUM  8.9 8.8* 8.7* 8.8*  --   --  8.8* 9.3  MG 2.1  --  2.1  --   --  1.8 1.9 2.2  PHOS 3.0  --   --   --   --  2.0* 3.2  --    Liver Function Tests: Recent Labs  Lab 08/13/23 1952 08/14/23 1024  AST 20 21  ALT 28 34  ALKPHOS 61 59  BILITOT 0.4 0.3  PROT 5.7* 5.9*  ALBUMIN  2.9* 2.8*    CBC: Recent Labs  Lab 08/09/23 1926 08/09/23 2112 08/12/23 0642 08/13/23 0547 08/13/23 1952 08/13/23 2011 08/14/23 1024 08/15/23 0303  WBC 18.4*   < > 13.4* 11.3* 11.7*  --  10.8* 14.7*  NEUTROABS 11.4*  --   --   --  7.1  --   --   --   HGB 15.8   < > 14.0 13.4 13.7 13.9 13.4 14.2  HCT 48.6   < > 44.3 41.1 42.0 41.0 41.1 43.5  MCV 95.7   < > 97.4 96.0 96.3  --  95.4 95.0  PLT 406*   < > 331 312 290  --  319 330   < > = values in this interval not displayed.   Cardiac Enzymes: Recent Labs  Lab 08/13/23 2153  CKTOTAL 59   BNP: BNP (last 3 results) Recent Labs    07/20/23 1304 08/09/23 1926 08/13/23 2000  BNP 63.9 59.3 393.0*    CBG: Recent Labs  Lab 08/12/23 0748 08/12/23 1138 08/12/23 1623 08/12/23 2052 08/12/23 2324  GLUCAP 83 129* 116* 217* 148*     IMAGING STUDIES ECHOCARDIOGRAM COMPLETE Result Date: 08/14/2023    ECHOCARDIOGRAM REPORT   Patient Name:   BYRLE GOBEN Date of Exam: 08/14/2023 Medical Rec #:  [id number]   Height:       72.0 in Accession #:    [id number]  Weight:       176.8 lb Date of Birth:  1960-08-21   BSA:          2.022 m Patient Age:    62 years    BP:  106/71 mmHg Patient Gender: M           HR:           65 bpm. Exam Location:  Inpatient Procedure: 2D  Echo, Cardiac Doppler and Color Doppler (Both Spectral and Color            Flow Doppler were utilized during procedure). Indications:    Atrial Fibrillation I48.91  History:        Patient has prior history of Echocardiogram examinations, most                 recent 02/14/2023. Cardiomyopathy and CHF, CAD, TIA, COPD and                 Pulmonary HTN, Arrythmias:Atrial Fibrillation; Risk                 Factors:Hypertension and Current Smoker.  Sonographer:    Terrilee Few RCS Referring Phys: [id number] ANASTASSIA DOUTOVA IMPRESSIONS  1. Left ventricular ejection fraction, by estimation, is 60 to 65%. The left ventricle has normal function. The left ventricle has no regional wall motion abnormalities. There is mild left ventricular hypertrophy. Left ventricular diastolic parameters are indeterminate.  2. Right ventricular systolic function is normal. The right ventricular size is normal. There is normal pulmonary artery systolic pressure. The estimated right ventricular systolic pressure is 14.6 mmHg.  3. The mitral valve is normal in structure. No evidence of mitral valve regurgitation. No evidence of mitral stenosis.  4. The aortic valve is tricuspid. There is mild calcification of the aortic valve. Aortic valve regurgitation is not visualized. Aortic valve sclerosis is present, with no evidence of aortic valve stenosis.  5. The inferior vena cava is dilated in size with >50% respiratory variability, suggesting right atrial pressure of 8 mmHg. FINDINGS  Left Ventricle: Left ventricular ejection fraction, by estimation, is 60 to 65%. The left ventricle has normal function. The left ventricle has no regional wall motion abnormalities. The left ventricular internal cavity size was normal in size. There is  mild left ventricular hypertrophy. Left ventricular diastolic parameters are indeterminate.  LV Wall Scoring: The mid inferoseptal segment is normal. Right Ventricle: The right ventricular size is normal. No increase  in right ventricular wall thickness. Right ventricular systolic function is normal. There is normal pulmonary artery systolic pressure. The tricuspid regurgitant velocity is 1.28 m/s, and  with an assumed right atrial pressure of 8 mmHg, the estimated right ventricular systolic pressure is 14.6 mmHg. Left Atrium: Left atrial size was normal in size. Right Atrium: Right atrial size was normal in size. Pericardium: There is no evidence of pericardial effusion. Mitral Valve: The mitral valve is normal in structure. No evidence of mitral valve regurgitation. No evidence of mitral valve stenosis. Tricuspid Valve: The tricuspid valve is normal in structure. Tricuspid valve regurgitation is mild . No evidence of tricuspid stenosis. Aortic Valve: The aortic valve is tricuspid. There is mild calcification of the aortic valve. Aortic valve regurgitation is not visualized. Aortic valve sclerosis is present, with no evidence of aortic valve stenosis. Aortic valve peak gradient measures 3.5 mmHg. Pulmonic Valve: The pulmonic valve was normal in structure. Pulmonic valve regurgitation is not visualized. No evidence of pulmonic stenosis. Aorta: The aortic root is normal in size and structure. Venous: The inferior vena cava is dilated in size with greater than 50% respiratory variability, suggesting right atrial pressure of 8 mmHg. IAS/Shunts: No atrial level shunt detected by color flow Doppler.  LEFT VENTRICLE PLAX  2D LVIDd:         3.40 cm   Diastology LVIDs:         2.10 cm   LV e' medial:    11.50 cm/s LV PW:         1.30 cm   LV E/e' medial:  8.2 LV IVS:        1.00 cm   LV e' lateral:   10.90 cm/s LVOT diam:     1.90 cm   LV E/e' lateral: 8.6 LV SV:         31 LV SV Index:   15 LVOT Area:     2.84 cm  RIGHT VENTRICLE            IVC RV S prime:     9.57 cm/s  IVC diam: 2.70 cm TAPSE (M-mode): 1.8 cm LEFT ATRIUM             Index        RIGHT ATRIUM           Index LA diam:        4.10 cm 2.03 cm/m   RA Area:     15.10 cm  LA Vol (A2C):   53.0 ml 26.21 ml/m  RA Volume:   37.20 ml  18.40 ml/m LA Vol (A4C):   38.3 ml 18.94 ml/m LA Biplane Vol: 46.1 ml 22.80 ml/m  AORTIC VALVE AV Area (Vmax): 1.75 cm AV Vmax:        93.00 cm/s AV Peak Grad:   3.5 mmHg LVOT Vmax:      57.37 cm/s LVOT Vmean:     36.667 cm/s LVOT VTI:       0.109 m  AORTA Ao Root diam: 3.30 cm Ao Asc diam:  3.30 cm MITRAL VALVE               TRICUSPID VALVE MV Area (PHT): 3.91 cm    TR Peak grad:   6.6 mmHg MV Decel Time: 194 msec    TR Vmax:        128.00 cm/s MV E velocity: 94.00 cm/s                            SHUNTS                            Systemic VTI:  0.11 m                            Systemic Diam: 1.90 cm Dorothye Gathers MD Electronically signed by Dorothye Gathers MD Signature Date/Time: 08/14/2023/3:25:33 PM    Final    DG Chest Portable 1 View Result Date: 08/13/2023 CLINICAL DATA:  Chest pain EXAM: PORTABLE CHEST 1 VIEW COMPARISON:  08/09/2023 FINDINGS: Lungs are clear.  No pleural effusion or pneumothorax. The heart is normal in size. IMPRESSION: No acute cardiopulmonary disease. Electronically Signed   By: Zadie Herter M.D.   On: 08/13/2023 22:36   Overnight EEG with video Result Date: 08/10/2023 Arleene Lack, MD     08/11/2023 10:34 AM Patient Name: Deondrea Harnisch MRN: [id number] Epilepsy Attending: Arleene Lack Referring Physician/Provider: Ogan, Okoronkwo U, MD Duration: 08/10/2023 7829 to 08/11/2023 5621 Patient history: 63yo M with intermittent twitching. EEG to evaluate for seizure Level of alertness: comatose-->awake, asleep AEDs during EEG study: Propofol , Versed  Technical aspects: This  EEG study was done with scalp electrodes positioned according to the 10-20 International system of electrode placement. Electrical activity was reviewed with band pass filter of 1-70Hz , sensitivity of 7 uV/mm, display speed of 67mm/sec with a 60Hz  notched filter applied as appropriate. EEG data were recorded continuously and digitally stored.  Video  monitoring was available and reviewed as appropriate. Description: EEG initially showed continuous generalized 3 to 6 Hz theta-delta slowing admixed with an excessive amount of 15 to 18 Hz beta activity distributed symmetrically and diffusely. Gradually as sedation was weaned EEG showed posterior dominant rhythm of 8 Hz activity of moderate voltage (25-35 uV) seen predominantly in posterior head regions, symmetric and reactive to eye opening and eye closing. Sleep was characterized by vertex waves, sleep spindles (12 to 14 Hz), maximal frontocentral region.  Hyperventilation and photic stimulation were not performed.   ABNORMALITY - Continuous slow, generalized IMPRESSION: This study was initially suggestive of severe diffuse encephalopathy, likely related to sedation. As sedation was weaned, EEG improved and was within normal limits.  No seizures or epileptiform discharges were seen throughout the recording. Arleene Lack   DG Chest Portable 1 View Result Date: 08/09/2023 CLINICAL DATA:  ETT placement EXAM: PORTABLE CHEST 1 VIEW COMPARISON:  None Available. FINDINGS: Lungs are clear. No pneumothorax or pleural effusion. Normal pulmonary vasculature. Unremarkable cardiac silhouette. Endotracheal tube tip 4 cm above carina. NG tube tip extends below the diaphragm and off the x-ray. IMPRESSION: No active disease. Electronically Signed   By: Sydell Eva M.D.   On: 08/09/2023 21:22   CT Head Wo Contrast Result Date: 08/09/2023 CLINICAL DATA:  Status post trauma. EXAM: CT HEAD WITHOUT CONTRAST TECHNIQUE: Contiguous axial images were obtained from the base of the skull through the vertex without intravenous contrast. RADIATION DOSE REDUCTION: This exam was performed according to the departmental dose-optimization program which includes automated exposure control, adjustment of the mA and/or kV according to patient size and/or use of iterative reconstruction technique. COMPARISON:  February 13, 2023 FINDINGS:  Brain: There is mild cerebral atrophy with widening of the extra-axial spaces and ventricular dilatation. There are areas of decreased attenuation within the white matter tracts of the supratentorial brain, consistent with microvascular disease changes. A chronic left basal ganglia infarct is noted. Vascular: Marked severity bilateral cavernous carotid artery calcification is noted. Skull: Normal. Negative for fracture or focal lesion. Sinuses/Orbits: There is mild left maxillary sinus mucosal thickening. Other: Endotracheal and enteric tubes are noted. IMPRESSION: 1. No acute intracranial abnormality. 2. Generalized cerebral atrophy with chronic white matter small vessel ischemic changes. 3. Chronic left basal ganglia infarct. Electronically Signed   By: Virgle Grime M.D.   On: 08/09/2023 20:07   DG Chest Port 1 View Result Date: 08/09/2023 CLINICAL DATA:  Shortness of breath EXAM: PORTABLE CHEST 1 VIEW COMPARISON:  07/20/2023 FINDINGS: Endotracheal tube tip in the intrathoracic trachea 6.9 cm from the carina. Subdiaphragmatic enteric tube. No focal consolidation, pleural effusion, or pneumothorax. No displaced rib fractures. Stable cardiomediastinal silhouette. IMPRESSION: Endotracheal tube tip in the intrathoracic trachea 6.9 cm from the carina. No acute cardiopulmonary process. Electronically Signed   By: Rozell Cornet M.D.   On: 08/09/2023 20:04   DG Abdomen 1 View Result Date: 08/09/2023 CLINICAL DATA:  Orogastric tube EXAM: ABDOMEN - 1 VIEW COMPARISON:  None Available. FINDINGS: Orogastric tube tip is in the proximal body of the stomach. The stomach is mildly dilated. There are postsurgical changes in the lower lumbar spine. IMPRESSION: Orogastric tube tip is  in the proximal body of the stomach. Electronically Signed   By: Tyron Gallon M.D.   On: 08/09/2023 20:02   DG Chest 2 View Result Date: 07/20/2023 CLINICAL DATA:  Shortness of breath. EXAM: CHEST - 2 VIEW COMPARISON:  Chest radiograph  dated 07/17/2023. FINDINGS: Stable cardiomediastinal silhouette. Hyperinflation. No focal consolidation, pleural effusion, or pneumothorax. Similar slight compression deformities of the midthoracic spine. No acute osseous abnormality. IMPRESSION: Hyperinflation.  No acute cardiopulmonary findings. Electronically Signed   By: Mannie Seek M.D.   On: 07/20/2023 14:58   DG Chest Port 1 View Result Date: 07/17/2023 CLINICAL DATA:  Shortness of breath EXAM: PORTABLE CHEST 1 VIEW COMPARISON:  July 14, 2023, July 04, 2023 FINDINGS: The cardiomediastinal silhouette is unchanged in contour. No pleural effusion. No pneumothorax. No acute pleuroparenchymal abnormality. IMPRESSION: No acute cardiopulmonary abnormality. Previously described RIGHT apical nodular opacities are less conspicuous on current exam. Follow-up chest CT would be recommended given history of COPD. Electronically Signed   By: Clancy Crimes M.D.   On: 07/17/2023 17:46    DISCHARGE EXAMINATION: Vitals:   08/14/23 2131 08/14/23 2334 08/15/23 0344 08/15/23 0802  BP: (!) 156/90 (!) 147/94  (!) 128/99  Pulse: 95 82  (!) 102  Resp:  12  17  Temp:  97.8 F (36.6 C) 98 F (36.7 C) 97.8 F (36.6 C)  TempSrc:  Oral Oral Oral  SpO2:  93%  95%  Weight:      Height:       General appearance: Awake alert.  In no distress Resp: Clear to auscultation bilaterally.  Normal effort Cardio: S1-S2 is normal regular.  No S3-S4.  No rubs murmurs or bruit GI: Abdomen is soft.  Nontender nondistended.  Bowel sounds are present normal.  No masses organomegaly   DISPOSITION: Home  Discharge Instructions     Call MD for:  difficulty breathing, headache or visual disturbances   Complete by: As directed    Call MD for:  extreme fatigue   Complete by: As directed    Call MD for:  persistant dizziness or light-headedness   Complete by: As directed    Call MD for:  persistant nausea and vomiting   Complete by: As directed    Call MD for:   severe uncontrolled pain   Complete by: As directed    Call MD for:  temperature >100.4   Complete by: As directed    Diet - low sodium heart healthy   Complete by: As directed    Discharge instructions   Complete by: As directed    Please be sure to follow-up with your primary care provider.  They can refer you to cardiology.  You were cared for by a hospitalist during your hospital stay. If you have any questions about your discharge medications or the care you received while you were in the hospital after you are discharged, you can call the unit and asked to speak with the hospitalist on call if the hospitalist that took care of you is not available. Once you are discharged, your primary care physician will handle any further medical issues. Please note that NO REFILLS for any discharge medications will be authorized once you are discharged, as it is imperative that you return to your primary care physician (or establish a relationship with a primary care physician if you do not have one) for your aftercare needs so that they can reassess your need for medications and monitor your lab values. If you do not  have a primary care physician, you can call [phone] for a physician referral.   Increase activity slowly   Complete by: As directed          Allergies as of 08/15/2023       Reactions   Ivp Dye [iodinated Contrast Media] Other (See Comments)   Seizures   Tramadol Other (See Comments)   Seizures   Doxycycline  Other (See Comments)   Burning sensation to his hands.        Medication List     STOP taking these medications    amLODipine  5 MG tablet Commonly known as: NORVASC    aspirin  EC 81 MG tablet   lisinopril  40 MG tablet Commonly known as: ZESTRIL        TAKE these medications    albuterol  108 (90 Base) MCG/ACT inhaler Commonly known as: VENTOLIN  HFA Inhale 2 puffs into the lungs every 4 (four) hours as needed for wheezing or shortness of breath. What  changed:  when to take this additional instructions   albuterol  (2.5 MG/3ML) 0.083% nebulizer solution Commonly known as: PROVENTIL  Use 1 vial (2.5 mg total) by nebulization every 6 (six) hours as needed for wheezing or shortness of breath. What changed: Another medication with the same name was changed. Make sure you understand how and when to take each.   apixaban  5 MG Tabs tablet Commonly known as: ELIQUIS  Take 1 tablet (5 mg total) by mouth 2 (two) times daily.   atorvastatin  40 MG tablet Commonly known as: LIPITOR  Take 1 tablet (40 mg total) by mouth daily.   cefdinir  300 MG capsule Commonly known as: OMNICEF  Take 1 capsule (300 mg total) by mouth 2 (two) times daily for 3 days.   diltiazem  120 MG 24 hr capsule Commonly known as: CARDIZEM  CD Take 1 capsule (120 mg total) by mouth daily.   fluticasone  50 MCG/ACT nasal spray Commonly known as: FLONASE  Place 1 spray into both nostrils daily. What changed:  when to take this reasons to take this   loratadine  10 MG tablet Commonly known as: Claritin  Take 1 tablet (10 mg total) by mouth daily.   metoprolol  tartrate 25 MG tablet Commonly known as: LOPRESSOR  Take 1 tablet (25 mg total) by mouth 2 (two) times daily.   nicotine  14 mg/24hr patch Commonly known as: NICODERM CQ  - dosed in mg/24 hours Place 1 patch (14 mg total) onto the skin daily.   nitroGLYCERIN  0.4 MG SL tablet Commonly known as: NITROSTAT  Place 1 tablet (0.4 mg total) under the tongue every 5 (five) minutes x 3 doses as needed for chest pain.   predniSONE  20 MG tablet Commonly known as: DELTASONE  Take 3 tablets (60 mg total) by mouth daily for 3 days, THEN 2 tablets (40 mg total) daily for 3 days, THEN 1 tablet (20 mg total) daily for 3 days. Start taking on: August 15, 2023   thiamine  100 MG tablet Commonly known as: Vitamin B-1 Take 1 tablet (100 mg total) by mouth daily.   Trelegy Ellipta  100-62.5-25 MCG/ACT Aepb Generic drug:  Fluticasone -Umeclidin-Vilant Inhale 1 puff into the lungs daily. What changed:  how much to take when to take this         PLEASE NOTE THAT PATIENT IS FOLLOWED BY IM TEACHING SERVICE.  TOTAL DISCHARGE TIME: 35 MINS  Kaliah Haddaway  [facility] Pager on [email or web address]  08/15/2023, 10:31 AM

## 2023-08-16 ENCOUNTER — Other Ambulatory Visit (HOSPITAL_COMMUNITY): Payer: Self-pay

## 2023-08-16 ENCOUNTER — Ambulatory Visit: Payer: Self-pay | Admitting: Student

## 2023-08-16 VITALS — BP 159/93 | HR 98 | Temp 97.9°F

## 2023-08-16 DIAGNOSIS — Z720 Tobacco use: Secondary | ICD-10-CM

## 2023-08-16 DIAGNOSIS — F1590 Other stimulant use, unspecified, uncomplicated: Secondary | ICD-10-CM

## 2023-08-16 DIAGNOSIS — I10 Essential (primary) hypertension: Secondary | ICD-10-CM

## 2023-08-16 DIAGNOSIS — J449 Chronic obstructive pulmonary disease, unspecified: Secondary | ICD-10-CM

## 2023-08-16 DIAGNOSIS — I4892 Unspecified atrial flutter: Secondary | ICD-10-CM

## 2023-08-16 DIAGNOSIS — J189 Pneumonia, unspecified organism: Secondary | ICD-10-CM

## 2023-08-16 DIAGNOSIS — F17210 Nicotine dependence, cigarettes, uncomplicated: Secondary | ICD-10-CM

## 2023-08-16 DIAGNOSIS — J440 Chronic obstructive pulmonary disease with acute lower respiratory infection: Secondary | ICD-10-CM | POA: Diagnosis not present

## 2023-08-16 DIAGNOSIS — I480 Paroxysmal atrial fibrillation: Secondary | ICD-10-CM

## 2023-08-16 DIAGNOSIS — F1510 Other stimulant abuse, uncomplicated: Secondary | ICD-10-CM

## 2023-08-16 DIAGNOSIS — T424X5D Adverse effect of benzodiazepines, subsequent encounter: Secondary | ICD-10-CM

## 2023-08-16 LAB — LEGIONELLA PNEUMOPHILA SEROGP 1 UR AG: L. pneumophila Serogp 1 Ur Ag: NEGATIVE

## 2023-08-16 MED ORDER — NYSTATIN 100000 UNIT/ML MT SUSP
5.0000 mL | Freq: Four times a day (QID) | OROMUCOSAL | 0 refills | Status: DC
  Filled 2023-08-16: qty 60, 3d supply, fill #0

## 2023-08-16 NOTE — Assessment & Plan Note (Signed)
 Noted during hospitalization, discharge with Cefdinir  300 mg BID for 3 days. Reports that cough has improved, no fevers.  - Complete Abx course

## 2023-08-16 NOTE — Assessment & Plan Note (Signed)
 Noted on UDS. Patient is counseled against it, as it is playing  a role in worsening of his underlying conditions

## 2023-08-16 NOTE — Patient Instructions (Signed)
 Thank you, Mr.Perkins Hawks for allowing us  to provide your care today. Today we discussed:  - I sent referrals for Lung doctor and heart doctor.    - Nystatin: 4 to 6 mL, orally 4 times daily, until the thrush disappears  - Make sure you rinse your mouth after each inhaler use   I have ordered the following labs for you:  Lab Orders  No laboratory test(s) ordered today     Tests ordered today:  None   Referrals ordered today:   Referral Orders         Ambulatory referral to Pulmonology         Amb Referral to AFIB Clinic      I have ordered the following medication/changed the following medications:   Stop the following medications: There are no discontinued medications.   Start the following medications: Meds ordered this encounter  Medications   nystatin (MYCOSTATIN) 100000 UNIT/ML suspension    Sig: Take 5 mLs (500,000 Units total) by mouth 4 (four) times daily.    Dispense:  60 mL    Refill:  0     Follow up:  3 months for COPD      Remember:   Should you have any questions or concerns please call the internal medicine clinic at [phone].     Lanney Pitts, DO [facility]

## 2023-08-16 NOTE — Assessment & Plan Note (Deleted)
 During hospitalization, he was noted to have new onset Afib with RVR, presently on Diltizem PO 120 mg daily, Lopressor  25 mg BID and Eliquis  5 mg BID. CHA2DS2VASc score of 4; ECHO LVEF 60-65%, normal LV and RV function. He left AMA from the hospital prior to getting discharged. Per exam, NSR.   - Continue Eliquis  5 mg BID - Continue Dilt 120 mg daily - Continue lopressor  25 mg BID  - Referral to Afib clinic is placed  - Counseled on polysubstance abuse with Meth and Benzos

## 2023-08-16 NOTE — Assessment & Plan Note (Signed)
 During hospitalization, he was noted to have new onset Afib with RVR, presently on Diltizem PO 120 mg daily, Lopressor  25 mg BID and Eliquis  5 mg BID. CHA2DS2VASc score of 4; ECHO LVEF 60-65%, normal LV and RV function. He left AMA from the hospital prior to getting discharged. Per exam, NSR.   - Continue Eliquis  5 mg BID - Continue Dilt 120 mg daily - Continue lopressor  25 mg BID  - Referral to Afib clinic is placed  - Counseled on polysubstance abuse with Meth and Benzos

## 2023-08-16 NOTE — Assessment & Plan Note (Addendum)
 04/22 - hospitalized to ICU, intubated for respiratory distress, PNA, then Afib RVR. There was a concern for seizure, EEG negative. He was transferred to the floor for management Afib. Then he left AMA on 04/26. He returned the same day, admitted for Acute H/HRF 2/2 COPD exacerbation, Influenza positive. He left AMA again on 04/28 prior to discharge. Presently reports SOB with exertion, reports improvement with cough. Per exam, he has expiratory wheeze that is present and oral thrush.   He is currently completing the Prednisone  taper therapy and Cefdinir  300 mg BID for 3 days. We did a Walk test to measure O2 saturation with result ~92%  - Complete Prednisone  taper - Trelegy 1 puff daily - Smoking cessation - Polysubstance abuse cessation - Referral to Pulm - PFTS ordered

## 2023-08-16 NOTE — Assessment & Plan Note (Signed)
 BP Readings from Last 3 Encounters:  08/16/23 (!) 159/93  08/15/23 (!) 128/99  08/13/23 (!) 156/99   Discharged yesterday with lopressor  25 mg BID. Blood pressure elevated this morning, reports that he has taken all of his morning medications. Denies any chest pain.  - Continue Lopressor  25 mg BID - Advised to keep BP log

## 2023-08-16 NOTE — Assessment & Plan Note (Signed)
>>  ASSESSMENT AND PLAN FOR COPD (CHRONIC OBSTRUCTIVE PULMONARY DISEASE) (HCC) WRITTEN ON 08/16/2023  2:07 PM BY [name], [name], DO  04/22 - hospitalized to ICU, intubated for respiratory distress, PNA, then Afib RVR. There was a concern for seizure, EEG negative. He was transferred to the floor for management Afib. Then he left AMA on 04/26. He returned the same day, admitted for Acute H/HRF 2/2 COPD exacerbation, Influenza positive. He left AMA again on 04/28 prior to discharge. Presently reports SOB with exertion, reports improvement with cough. Per exam, he has expiratory wheeze that is present and oral thrush.   He is currently completing the Prednisone  taper therapy and Cefdinir  300 mg BID for 3 days. We did a Walk test to measure O2 saturation with result ~92%  - Complete Prednisone  taper - Trelegy 1 puff daily - Smoking cessation - Polysubstance abuse cessation - Referral to Pulm - PFTS ordered

## 2023-08-16 NOTE — Assessment & Plan Note (Signed)
 Presently using Nicotine  patches, reports that he is smoking 6-7 cigarettes a day. Counseling provided

## 2023-08-16 NOTE — Assessment & Plan Note (Signed)
>>  ASSESSMENT AND PLAN FOR TOBACCO ABUSE WRITTEN ON 08/16/2023  2:04 PM BY [name], [name], DO  Presently using Nicotine  patches, reports that he is smoking 6-7 cigarettes a day. Counseling provided

## 2023-08-16 NOTE — Progress Notes (Signed)
 Established Patient Office Visit  Subjective   Patient ID: Francisco [name], male    DOB: 21-Nov-1960  Age: 63 y.o. MRN: [id number]  Chief Complaint  Patient presents with   Hospitalization Follow-up    Pt left AMA from hospital yesterday, here today to go over meds and discuss his shortness of breath     HPI This is a 63 year old male living with a history stated below and presents today for HFU. Please see problem based assessment and plan for additional details.   Past Medical History:  Diagnosis Date   CHF (congestive heart failure) (HCC)    COPD (chronic obstructive pulmonary disease) (HCC)    COPD with acute exacerbation (HCC) 10/21/2021   Coronary artery disease    a. s/p prior PCI.   Hepatitis-C    Hypertension    IV drug abuse (HCC)    a. 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   Medically noncompliant    MI (myocardial infarction) (HCC)    Tobacco abuse    ROS   AS per assessment and plan  Objective:     BP (!) 159/93 (BP Location: Left Arm, Patient Position: Sitting, Cuff Size: Normal)   Pulse 98   Temp 97.9 F (36.6 C) (Oral)   SpO2 97% Comment: on room air at rest BP Readings from Last 3 Encounters:  08/16/23 (!) 159/93  08/15/23 (!) 128/99  08/13/23 (!) 156/99   Wt Readings from Last 3 Encounters:  08/13/23 176 lb 12.9 oz (80.2 kg)  08/13/23 173 lb 1 oz (78.5 kg)  07/28/23 169 lb (76.7 kg)      Physical Exam  General: Sitting in chair, no acute distress Cardiovascular: Regular rate, no murmurs appreciated Pulmonary: poor inspiratory effort, + expiratory wheeze  Abdomen: Soft, nontender, nondistended, bowel sounds present MSK: Range of motion intact, no lower extremity edema  No results found for any visits on 08/16/23.  Last metabolic panel Lab Results  Component Value Date   GLUCOSE 138 (H) 08/15/2023   NA 136 08/15/2023   K 4.4 08/15/2023   CL 98 08/15/2023   CO2 26 08/15/2023   BUN 20 08/15/2023   CREATININE 0.95 08/15/2023    GFRNONAA >60 08/15/2023   CALCIUM  9.3 08/15/2023   PHOS 3.2 08/14/2023   PROT 5.9 (L) 08/14/2023   ALBUMIN  2.8 (L) 08/14/2023   BILITOT 0.3 08/14/2023   ALKPHOS 59 08/14/2023   AST 21 08/14/2023   ALT 34 08/14/2023   ANIONGAP 12 08/15/2023     Lab Results  Component Value Date   CHOL 134 02/13/2023   HDL 49 02/13/2023   LDLCALC 71 02/13/2023   TRIG 44 08/10/2023   CHOLHDL 2.7 02/13/2023     The ASCVD Risk score (Arnett DK, et al., 2019) failed to calculate for the following reasons:   Risk score cannot be calculated because patient has a medical history suggesting prior/existing ASCVD    Assessment & Plan:  Patient is discussed Dr Adriane Albe   Problem List Items Addressed This Visit       Cardiovascular and Mediastinum   Hypertension (Chronic)   BP Readings from Last 3 Encounters:  08/16/23 (!) 159/93  08/15/23 (!) 128/99  08/13/23 (!) 156/99   Discharged yesterday with lopressor  25 mg BID. Blood pressure elevated this morning, reports that he has taken all of his morning medications. Denies any chest pain.  - Continue Lopressor  25 mg BID - Advised to keep BP log       Paroxysmal atrial  fibrillation with RVR (HCC) - Primary   During hospitalization, he was noted to have new onset Afib with RVR, presently on Diltizem PO 120 mg daily, Lopressor  25 mg BID and Eliquis  5 mg BID. CHA2DS2VASc score of 4; ECHO LVEF 60-65%, normal LV and RV function. He left AMA from the hospital prior to getting discharged. Per exam, NSR.   - Continue Eliquis  5 mg BID - Continue Dilt 120 mg daily - Continue lopressor  25 mg BID  - Referral to Afib clinic is placed  - Counseled on polysubstance abuse with Meth and Benzos      Relevant Orders   Amb Referral to AFIB Clinic   Atrial flutter with rapid ventricular response (HCC)     Respiratory   COPD (chronic obstructive pulmonary disease) (HCC)   04/22 - hospitalized to ICU, intubated for respiratory distress, PNA, then Afib RVR. There  was a concern for seizure, EEG negative. He was transferred to the floor for management Afib. Then he left AMA on 04/26. He returned the same day, admitted for Acute H/HRF 2/2 COPD exacerbation, Influenza positive. He left AMA again on 04/28 prior to discharge. Presently reports SOB with exertion, reports improvement with cough. Per exam, he has expiratory wheeze that is present and oral thrush.   He is currently completing the Prednisone  taper therapy and Cefdinir  300 mg BID for 3 days. We did a Walk test to measure O2 saturation with result ~92%  - Complete Prednisone  taper - Trelegy 1 puff daily - Smoking cessation - Polysubstance abuse cessation - Referral to Pulm - PFTS ordered      Relevant Orders   Ambulatory referral to Pulmonology   Pulmonary function test   CAP (community acquired pneumonia)   Noted during hospitalization, discharge with Cefdinir  300 mg BID for 3 days. Reports that cough has improved, no fevers.  - Complete Abx course       Relevant Medications   nystatin (MYCOSTATIN) 100000 UNIT/ML suspension     Other   Methamphetamine use (HCC)   Noted on UDS. Patient is counseled against it, as it is playing  a role in worsening of his underlying conditions       Tobacco abuse   Presently using Nicotine  patches, reports that he is smoking 6-7 cigarettes a day. Counseling provided        Benzodiazepine causing adverse effect in therapeutic use   Noted on UDS. Patient is counseled against it, as it is playing  a role in worsening of his underlying conditions        Return in about 3 months (around 11/15/2023) for COPD .    Lanney Pitts, DO

## 2023-08-18 NOTE — Telephone Encounter (Signed)
 This referral has already been sent  [facility] at [facility] in [place], [place]    Located in: [facility] Landon Pinion D. [facility] Address: [address], [place], [place] [address] Phone: [phone]     Copied from CRM [id number]. Topic: Referral - Request for Referral >> Aug 18, 2023 11:51 AM Wynona Hedger wrote: Did the patient discuss referral with their provider in the last year? Yes (If No - schedule appointment) (If Yes - send message)  Appointment offered? No  Type of order/referral and detailed reason for visit: Cardiologist  Preference of office, provider, location: n/a  If referral order, have you been seen by this specialty before? No (If Yes, this issue or another issue? When? Where?  Can we respond through MyChart? Yes

## 2023-08-20 NOTE — Progress Notes (Signed)
 Internal Medicine Clinic Attending  Case discussed with the resident at the time of the visit.  We reviewed the resident's history and exam and pertinent patient test results.  I agree with the assessment, diagnosis, and plan of care documented in the resident's note.

## 2023-08-22 DIAGNOSIS — F411 Generalized anxiety disorder: Secondary | ICD-10-CM | POA: Diagnosis not present

## 2023-08-24 DIAGNOSIS — F411 Generalized anxiety disorder: Secondary | ICD-10-CM | POA: Diagnosis not present

## 2023-08-26 ENCOUNTER — Other Ambulatory Visit (HOSPITAL_COMMUNITY): Payer: Self-pay

## 2023-08-26 ENCOUNTER — Encounter: Admitting: Student

## 2023-08-26 DIAGNOSIS — F411 Generalized anxiety disorder: Secondary | ICD-10-CM | POA: Diagnosis not present

## 2023-08-29 DIAGNOSIS — F411 Generalized anxiety disorder: Secondary | ICD-10-CM | POA: Diagnosis not present

## 2023-08-29 DIAGNOSIS — Z419 Encounter for procedure for purposes other than remedying health state, unspecified: Secondary | ICD-10-CM | POA: Diagnosis not present

## 2023-08-30 ENCOUNTER — Emergency Department (HOSPITAL_COMMUNITY)

## 2023-08-30 ENCOUNTER — Encounter (HOSPITAL_COMMUNITY): Payer: Self-pay

## 2023-08-30 ENCOUNTER — Other Ambulatory Visit (HOSPITAL_COMMUNITY): Payer: Self-pay

## 2023-08-30 ENCOUNTER — Other Ambulatory Visit: Payer: Self-pay

## 2023-08-30 ENCOUNTER — Emergency Department (HOSPITAL_COMMUNITY)
Admission: EM | Admit: 2023-08-30 | Discharge: 2023-08-30 | Discharge status: Against Medical Advice | Attending: Emergency Medicine | Admitting: Emergency Medicine

## 2023-08-30 DIAGNOSIS — R0600 Dyspnea, unspecified: Secondary | ICD-10-CM | POA: Insufficient documentation

## 2023-08-30 DIAGNOSIS — Z7901 Long term (current) use of anticoagulants: Secondary | ICD-10-CM | POA: Insufficient documentation

## 2023-08-30 DIAGNOSIS — Z5329 Procedure and treatment not carried out because of patient's decision for other reasons: Secondary | ICD-10-CM | POA: Diagnosis not present

## 2023-08-30 DIAGNOSIS — F411 Generalized anxiety disorder: Secondary | ICD-10-CM | POA: Diagnosis not present

## 2023-08-30 DIAGNOSIS — Z7951 Long term (current) use of inhaled steroids: Secondary | ICD-10-CM | POA: Diagnosis not present

## 2023-08-30 DIAGNOSIS — J449 Chronic obstructive pulmonary disease, unspecified: Secondary | ICD-10-CM | POA: Insufficient documentation

## 2023-08-30 DIAGNOSIS — R062 Wheezing: Secondary | ICD-10-CM | POA: Insufficient documentation

## 2023-08-30 DIAGNOSIS — R0602 Shortness of breath: Secondary | ICD-10-CM | POA: Diagnosis present

## 2023-08-30 LAB — COMPREHENSIVE METABOLIC PANEL WITH GFR
ALT: 60 U/L — ABNORMAL HIGH (ref 0–44)
AST: 31 U/L (ref 15–41)
Albumin: 3.1 g/dL — ABNORMAL LOW (ref 3.5–5.0)
Alkaline Phosphatase: 55 U/L (ref 38–126)
Anion gap: 7 (ref 5–15)
BUN: 11 mg/dL (ref 8–23)
CO2: 28 mmol/L (ref 22–32)
Calcium: 8.7 mg/dL — ABNORMAL LOW (ref 8.9–10.3)
Chloride: 106 mmol/L (ref 98–111)
Creatinine, Ser: 1.05 mg/dL (ref 0.61–1.24)
GFR, Estimated: >60 mL/min (ref >60)
Glucose, Bld: 105 mg/dL — ABNORMAL HIGH (ref 70–99)
Potassium: 3.8 mmol/L (ref 3.5–5.1)
Sodium: 141 mmol/L (ref 135–145)
Total Bilirubin: 0.6 mg/dL (ref 0.0–1.2)
Total Protein: 6.3 g/dL — ABNORMAL LOW (ref 6.5–8.1)

## 2023-08-30 LAB — I-STAT VENOUS BLOOD GAS, ED
Acid-Base Excess: 1 mmol/L (ref 0.0–2.0)
Bicarbonate: 27.9 mmol/L (ref 20.0–28.0)
Calcium, Ion: 1.17 mmol/L (ref 1.15–1.40)
HCT: 44 % (ref 39.0–52.0)
Hemoglobin: 15 g/dL (ref 13.0–17.0)
O2 Saturation: 90 %
Potassium: 3.7 mmol/L (ref 3.5–5.1)
Sodium: 142 mmol/L (ref 135–145)
TCO2: 29 mmol/L (ref 22–32)
pCO2, Ven: 51.8 mmHg (ref 44–60)
pH, Ven: 7.338 (ref 7.25–7.43)
pO2, Ven: 63 mmHg — ABNORMAL HIGH (ref 32–45)

## 2023-08-30 LAB — CBC WITH DIFFERENTIAL/PLATELET
Abs Immature Granulocytes: 0.05 10*3/uL (ref 0.00–0.07)
Basophils Absolute: 0.1 10*3/uL (ref 0.0–0.1)
Basophils Relative: 1 %
Eosinophils Absolute: 0.2 10*3/uL (ref 0.0–0.5)
Eosinophils Relative: 2 %
HCT: 44 % (ref 39.0–52.0)
Hemoglobin: 14.6 g/dL (ref 13.0–17.0)
Immature Granulocytes: 1 %
Lymphocytes Relative: 26 %
Lymphs Abs: 2.4 10*3/uL (ref 0.7–4.0)
MCH: 31.5 pg (ref 26.0–34.0)
MCHC: 33.2 g/dL (ref 30.0–36.0)
MCV: 94.8 fL (ref 80.0–100.0)
Monocytes Absolute: 1.1 10*3/uL — ABNORMAL HIGH (ref 0.1–1.0)
Monocytes Relative: 12 %
Neutro Abs: 5.4 10*3/uL (ref 1.7–7.7)
Neutrophils Relative %: 58 %
Platelets: 210 10*3/uL (ref 150–400)
RBC: 4.64 MIL/uL (ref 4.22–5.81)
RDW: 14.1 % (ref 11.5–15.5)
WBC: 9.3 10*3/uL (ref 4.0–10.5)
nRBC: 0 % (ref 0.0–0.2)

## 2023-08-30 LAB — TROPONIN I (HIGH SENSITIVITY)
Troponin I (High Sensitivity): 15 ng/L (ref <18)
Troponin I (High Sensitivity): 11 ng/L (ref <18)

## 2023-08-30 LAB — BRAIN NATRIURETIC PEPTIDE: B Natriuretic Peptide: 50.7 pg/mL (ref 0.0–100.0)

## 2023-08-30 MED ORDER — MAGNESIUM SULFATE IN D5W 1-5 GM/100ML-% IV SOLN
1.0000 g | Freq: Once | INTRAVENOUS | Status: AC
  Administered 2023-08-30: 1 g via INTRAVENOUS
  Filled 2023-08-30: qty 100

## 2023-08-30 MED ORDER — METHYLPREDNISOLONE 4 MG PO TBPK
ORAL_TABLET | ORAL | 0 refills | Status: AC
  Filled 2023-08-30: qty 21, 6d supply, fill #0

## 2023-08-30 MED ORDER — MAGNESIUM SULFATE 2 GM/50ML IV SOLN: 2.0000 g | Freq: Once | INTRAVENOUS | Status: DC

## 2023-08-30 NOTE — Discharge Instructions (Signed)
 Return for any problem.  ?

## 2023-08-30 NOTE — ED Notes (Signed)
 CCMD notified of pt being placed on telemetry

## 2023-08-30 NOTE — ED Provider Notes (Signed)
 [facility]ERGENC[facility] DEPARTMENT AT [facility] Provider Note   CSN: [id number] Arrival date & time: 08/30/23  1012     History {Add pertinent medical, surgical, social history, OB history to HPI:1} Chief Complaint  Patient presents with   Shortness of Breath    Francisco [name] is a 63 y.o. male.  63 year old male with prior medical history as detailed below presents for evaluation.  Patient reports increased shortness of breath and wheezing x 2 days.  This morning his symptoms were worse.  He called EMS.  Room air sats were in the mid 80s per EMS.  Patient was given 2 DuoNeb's during transport.  He was given 125 mg Solu-Medrol  during transport.  Patient reports recent intubation for COPD exacerbation here at [facility].  Patient denies chest pain.  He denies fever.  He reports increased cough and wheeze x 2 to 3 days.  He is not currently using steroids.  The history is provided by the patient.       Home Medications Prior to Admission medications   Medication Sig Start Date End Date Taking? Authorizing Provider  albuterol  (PROVENTIL ) (2.5 MG/3ML) 0.083% nebulizer solution Use 1 vial (2.5 mg total) by nebulization every 6 (six) hours as needed for wheezing or shortness of breath. 06/16/23   Atway, Rayann N, DO  albuterol  (VENTOLIN  HFA) 108 (90 Base) MCG/ACT inhaler Inhale 2 puffs into the lungs every 4 (four) hours as needed for wheezing or shortness of breath. Patient taking differently: Inhale 2 puffs into the lungs See admin instructions. Inhale 2 puffs into the lungs every 1-2 hours as needed for shortness of breath or wheezing 06/10/23   Nooruddin, Saad, MD  apixaban  (ELIQUIS ) 5 MG TABS tablet Take 1 tablet (5 mg total) by mouth 2 (two) times daily. 08/15/23   Krishnan, Gokul, MD  atorvastatin  (LIPITOR ) 40 MG tablet Take 1 tablet (40 mg total) by mouth daily. 07/06/23   [name], [name], DO  diltiazem  (CARDIZEM  CD) 120 MG 24 hr capsule Take 1 capsule (120 mg total) by mouth  daily. 08/15/23   Krishnan, Gokul, MD  fluticasone  (FLONASE ) 50 MCG/ACT nasal spray Place 1 spray into both nostrils daily. Patient taking differently: Place 1 spray into both nostrils 2 (two) times daily as needed for allergies or rhinitis. 07/28/23   [name], [name], DO  Fluticasone -Umeclidin-Vilant (TRELEGY ELLIPTA ) 100-62.5-25 MCG/ACT AEPB Inhale 1 puff into the lungs daily. Patient taking differently: Inhale 2 puffs into the lungs 2 (two) times daily. 07/28/23 10/02/23  [name], [name], DO  loratadine  (CLARITIN ) 10 MG tablet Take 1 tablet (10 mg total) by mouth daily. 07/28/23 07/27/24  [name], [name], DO  metoprolol  tartrate (LOPRESSOR ) 25 MG tablet Take 1 tablet (25 mg total) by mouth 2 (two) times daily. 08/15/23   Krishnan, Gokul, MD  nicotine  (NICODERM CQ  - DOSED IN MG/24 HOURS) 14 mg/24hr patch Place 1 patch (14 mg total) onto the skin daily. 07/28/23   [name], [name], DO  nitroGLYCERIN  (NITROSTAT ) 0.4 MG SL tablet Place 1 tablet (0.4 mg total) under the tongue every 5 (five) minutes x 3 doses as needed for chest pain. 10/14/22   Nooruddin, Saad, MD  nystatin  (MYCOSTATIN ) 100000 UNIT/ML suspension Take 5 mLs (500,000 Units total) by mouth 4 (four) times daily. 08/16/23   [name], [name], DO  thiamine  (VITAMIN B-1) 100 MG tablet Take 1 tablet (100 mg total) by mouth daily. 08/15/23   Krishnan, Gokul, MD      Allergies    Ivp dye [iodinated contrast media],  Tramadol, and Doxycycline     Review of Systems   Review of Systems  All other systems reviewed and are negative.   Physical Exam Updated Vital Signs BP (!) 133/90 (BP Location: Right Arm)   Pulse 81   Temp (!) 97.5 F (36.4 C) (Oral)   Resp (!) 23   Ht 6' (1.829 m)   Wt 77.1 kg   SpO2 100%   BMI 23.06 kg/m  Physical Exam Vitals and nursing note reviewed.  Constitutional:      General: He is not in acute distress.    Appearance: Normal appearance. He is well-developed.  HENT:     Head: Normocephalic and atraumatic.   Eyes:     Conjunctiva/sclera: Conjunctivae normal.     Pupils: Pupils are equal, round, and reactive to light.  Cardiovascular:     Rate and Rhythm: Normal rate and regular rhythm.     Heart sounds: Normal heart sounds.  Pulmonary:     Effort: No respiratory distress.     Comments: Mild increase in work of breathing, diffuse expiratory wheezes in all lung fields Abdominal:     General: There is no distension.     Palpations: Abdomen is soft.     Tenderness: There is no abdominal tenderness.  Musculoskeletal:        General: No deformity. Normal range of motion.     Cervical back: Normal range of motion and neck supple.  Skin:    General: Skin is warm and dry.  Neurological:     General: No focal deficit present.     Mental Status: He is alert and oriented to person, place, and time.     ED Results / Procedures / Treatments   Labs (all labs ordered are listed, but only abnormal results are displayed) Labs Reviewed  COMPREHENSIVE METABOLIC PANEL WITH GFR  CBC WITH DIFFERENTIAL/PLATELET  BRAIN NATRIURETIC PEPTIDE  RAPID URINE DRUG SCREEN, HOSP PERFORMED  I-STAT VENOUS BLOOD GAS, ED  TROPONIN I (HIGH SENSITIVITY)    EKG EKG Interpretation Date/Time:  Tuesday Aug 30 2023 10:17:50 EDT Ventricular Rate:  80 PR Interval:  152 QRS Duration:  89 QT Interval:  374 QTC Calculation: 432 R Axis:   32  Text Interpretation: Sinus rhythm Consider left atrial enlargement Borderline low voltage, extremity leads Confirmed by Angela Kell [phone]) on 08/30/2023 10:20:06 AM  Radiology No results found.  Procedures Procedures  {Document cardiac monitor, telemetry assessment procedure when appropriate:1}  Medications Ordered in ED Medications  magnesium  sulfate IVPB 1 g 100 mL (has no administration in time range)    ED Course/ Medical Decision Making/ A&P   {   Click here for ABCD2, HEART and other calculatorsREFRESH Note before signing :1}                               Medical Decision Making Amount and/or Complexity of Data Reviewed Labs: ordered. Radiology: ordered.  Risk Prescription drug management.    Medical Screen Complete  This patient presented to the ED with complaint of shortness of breath, wheezing.  This complaint involves an extensive number of treatment options. The initial differential diagnosis includes, but is not limited to, OPD exacerbation, CHF exacerbation, pneumonia, metabolic abnormality, etc.  This presentation is: Acute, Chronic, Self-Limited, Previously Undiagnosed, Uncertain Prognosis, Complicated, Systemic Symptoms, and Threat to Life/Bodily Function    Co morbidities that complicated the patient's evaluation  ***   Additional history obtained:  Additional  history obtained from {History source:19196::"EMS","Spouse","Family","Friend","Caregiver"} External records from outside sources obtained and reviewed including prior ED visits and prior Inpatient records.    Lab Tests:  I ordered and personally interpreted labs.  The pertinent results include:  ***   Imaging Studies ordered:  I ordered imaging studies including ***  I independently visualized and interpreted obtained imaging which showed *** I agree with the radiologist interpretation.   Cardiac Monitoring:  The patient was maintained on a cardiac monitor.  I personally viewed and interpreted the cardiac monitor which showed an underlying rhythm of: ***   Medicines ordered:  I ordered medication including ***  for ***  Reevaluation of the patient after these medicines showed that the patient: {resolved/improved/worsened:23923::"improved"}    Test Considered:  ***   Critical Interventions:  ***   Consultations Obtained:  I consulted ***,  and discussed lab and imaging findings as well as pertinent plan of care.    Problem List / ED Course:  ***   Reevaluation:  After the interventions noted above, I reevaluated the patient  and found that they have: {resolved/improved/worsened:23923::"improved"}   Social Determinants of Health:  ***   Disposition:  After consideration of the diagnostic results and the patients response to treatment, I feel that the patent would benefit from ***.    {Document critical care time when appropriate:1} {Document review of labs and clinical decision tools ie heart score, Chads2Vasc2 etc:1}  {Document your independent review of radiology images, and any outside records:1} {Document your discussion with family members, caretakers, and with consultants:1} {Document social determinants of health affecting pt's care:1} {Document your decision making why or why not admission, treatments were needed:1} Final Clinical Impression(s) / ED Diagnoses Final diagnoses:  None    Rx / DC Orders ED Discharge Orders     None

## 2023-08-30 NOTE — ED Triage Notes (Signed)
 Pt presents to ED via [facility] for SOB x2 days. Woke this AM worse. RA sats mid 80s, wheezing. Hx COPD Given in route: 2 duonebs, solumedrol 125mcg 93-95% while on duonebs 3 weeks ago was intubated for COPD here at [facility] BP 120/76 P 85

## 2023-08-30 NOTE — ED Notes (Signed)
 Pt states he feels much better after magnesium  and wants to leave. Pt informed waiting on repeat troponin. Pt states he does not want to wait and asks to have IV removed so he can leave. This RN explained AMA process. Pt states he wishes to leave AMA.  Dr Reba Camper made aware.

## 2023-08-31 ENCOUNTER — Ambulatory Visit: Payer: Self-pay

## 2023-08-31 NOTE — Telephone Encounter (Signed)
 Pt hung up before agent could transfer call, NT attempted to call patient back and he hung up again.      [phone]

## 2023-08-31 NOTE — Telephone Encounter (Signed)
 This RN made third attempt to contact patient. It states "your call could not be completed as dialed."

## 2023-08-31 NOTE — Telephone Encounter (Signed)
 Copied from CRM [id number]. Topic: Clinical - Medication Question >> Aug 31, 2023 10:16 AM Adrianna P wrote: Reason for BJY:Francisco [name] has copd and emphysema and has been coughing a lot and has been having bad back pain 8/10 and would like dr to prescribe something for the pain, patient cannot take tramadol due to it previously causing a seizure, pt also wants dr to know that he had his breathing test done >> Aug 31, 2023  2:42 PM Retta Caster wrote: Patient called back on update for his pain issue-copd and emphysema and has been coughing a lot and has been having bad back pain 8/10. Called office ans NT for assistance with issue. Transfer to NT Answer Assessment - Initial Assessment Questions 1. ONSET: "When did the pain begin?"      Last week 2. LOCATION: "Where does it hurt?" (upper, mid or lower back)     Lower right 3. SEVERITY: "How bad is the pain?"  (e.g., Scale 1-10; mild, moderate, or severe)   - MILD (1-3): Doesn't interfere with normal activities.    - MODERATE (4-7): Interferes with normal activities or awakens from sleep.    - SEVERE (8-10): Excruciating pain, unable to do any normal activities.      8 4. PATTERN: "Is the pain constant?" (e.g., yes, no; constant, intermittent)      Constant worse when coughing  5. RADIATION: "Does the pain shoot into your legs or somewhere else?"     Denies  6. CAUSE:  "What do you think is causing the back pain?"      Not sure  7. BACK OVERUSE:  "Any recent lifting of heavy objects, strenuous work or exercise?"     Na  8. MEDICINES: "What have you taken so far for the pain?" (e.g., nothing, acetaminophen , NSAIDS)     Denies  9. NEUROLOGIC SYMPTOMS: "Do you have any weakness, numbness, or problems with bowel/bladder control?"     Denies  10. OTHER SYMPTOMS: "Do you have any other symptoms?" (e.g., fever, abdomen pain, burning with urination, blood in urine)       Denies  Protocols used: Back Pain-A-AH

## 2023-08-31 NOTE — Telephone Encounter (Signed)
 Copied from CRM [id number]. Topic: Clinical - Medication Question >> Aug 31, 2023 10:16 AM Adrianna P wrote: Reason for BJY:NWGNFAO has copd and emphysema and has been coughing a lot and has been having bad back pain 8/10 and would like dr to prescribe something for the pain, patient cannot take tramadol due to it previously causing a seizure, pt also wants dr to know that he had his breathing test done >> Aug 31, 2023  2:42 PM Retta Caster wrote: Patient called back on update for his pain issue-copd and emphysema and has been coughing a lot and has been having bad back pain 8/10. Called office ans NT for assistance with issue. Transfer to NT

## 2023-08-31 NOTE — Telephone Encounter (Addendum)
 2nd attempt, "call cannot be completed as dialed"  1st attempt, "call cannot completed as dialed"   Copied from CRM [id number]. Topic: Clinical - Medication Question >> Aug 31, 2023 10:16 AM Adrianna P wrote: Reason for EXB:MWUXLKG has copd and emphysema and has been coughing a lot and has been having bad back pain 8/10 and would like dr to prescribe something for the pain, patient cannot take tramadol due to it previously causing a seizure, pt also wants dr to know that he had his breathing test done

## 2023-08-31 NOTE — Telephone Encounter (Signed)
  Chief Complaint: back pain  Symptoms: pain and coughing    Disposition: [] ED /[] Urgent Care (no appt availability in office) / [] Appointment(In office/virtual)/ []  [facility]/ [] Home Care/ [] Refused Recommended Disposition /[] [facility]/ [x]  Follow-up with PCP Additional Notes: Pt called with back pain that worsens when coughing. Pt rated pain 8/10. Pt states it hurts on right lower side of back. RN unable to schedule pt and utilized CAL but was told to send CRM and office would call after receiving.  RN advised seek care at [facility] or ED if pain becomes unbearable. RN gave care advice and pt verbalized understanding of all. Please advise

## 2023-09-01 DIAGNOSIS — F411 Generalized anxiety disorder: Secondary | ICD-10-CM | POA: Diagnosis not present

## 2023-09-02 ENCOUNTER — Ambulatory Visit: Payer: Self-pay

## 2023-09-02 ENCOUNTER — Ambulatory Visit: Admitting: Cardiology

## 2023-09-02 NOTE — Telephone Encounter (Signed)
 Unable to reach patient after 3 attempts by E2C2 NT, routing to the provider for resolution per protocol.

## 2023-09-02 NOTE — Telephone Encounter (Signed)
 Copied from CRM [id number]. Topic: Clinical - Medication Question >> Sep 02, 2023 10:43 AM Tiffany H wrote: Patient called to follow up on request for assistance with bad cough. Extreme pain due to cough in back.  Patient called prior to being transferred to this RN. Attempted to call patient back & no answer--- "Call cannot be completed as dialed"

## 2023-09-02 NOTE — Telephone Encounter (Signed)
   Chief Complaint: back pain  Symptoms: pain   Disposition: [] ED /[x] Urgent Care (no appt availability in office) / [] Appointment(In office/virtual)/ []  [facility]/ [] Home Care/ [] Refused Recommended Disposition /[] [facility]/ [x]  Follow-up with PCP Additional Notes: Pt calling with lower back that has been present for 3-4 days. Pt stated the pain is a 10 when he coughs. Pt has COPD and coughs a lot. RN advised pt to go ED or UC due to pain but pt stated he doesn't know if has transportation. No office appt made. Office did not address the message from 5/14 regarding pain and CAL saying they would call pt and schedule. Call was disconnected twice. Unable to reach pt after second try. Left message. Please call pt back at [phone].             Copied from CRM [id number]. Topic: Clinical - Medication Question >> Aug 31, 2023 10:16 AM Adrianna P wrote: Reason for GMW:[id number] has copd and emphysema and has been coughing a lot and has been having bad back pain 8/10 and would like dr to prescribe something for the pain, patient cannot take tramadol due to it previously causing a seizure, pt also wants dr to know that he had his breathing test done >> Sep 02, 2023 11:44 AM Retta Caster wrote: Number changed to [phone] >> Sep 02, 2023 10:43 AM Jyl Or H wrote: Patient called to follow up on request for assistance with bad cough. Extreme pain due to cough in back. >> Aug 31, 2023  2:42 PM Retta Caster wrote: Patient called back on update for his pain issue-copd and emphysema and has been coughing a lot and has been having bad back pain 8/10. Called office ans NT for assistance with issue. Transfer to NT Reason for Disposition  [1] SEVERE back pain (e.g., excruciating, unable to do any normal activities) AND [2] not improved 2 hours after pain medicine  Answer Assessment - Initial Assessment Questions 1. ONSET: "When did the pain begin?"      3-4 days  2. LOCATION: "Where  does it hurt?" (upper, mid or lower back)     Lower back  3. SEVERITY: "How bad is the pain?"  (e.g., Scale 1-10; mild, moderate, or severe)   - MILD (1-3): Doesn't interfere with normal activities.    - MODERATE (4-7): Interferes with normal activities or awakens from sleep.    - SEVERE (8-10): Excruciating pain, unable to do any normal activities.      8 4. PATTERN: "Is the pain constant?" (e.g., yes, no; constant, intermittent)      Constant  5. RADIATION: "Does the pain shoot into your legs or somewhere else?"     denies 6. CAUSE:  "What do you think is causing the back pain?"      surgeries  8. MEDICINES: "What have you taken so far for the pain?" (e.g., nothing, acetaminophen , NSAIDS)     Advil   9. NEUROLOGIC SYMPTOMS: "Do you have any weakness, numbness, or problems with bowel/bladder control?"     Denies  10. OTHER SYMPTOMS: "Do you have any other symptoms?" (e.g., fever, abdomen pain, burning with urination, blood in urine)       Denies  Protocols used: Back Pain-A-AH

## 2023-09-02 NOTE — Telephone Encounter (Signed)
 Attempt 2 call could not be completed as dialed.

## 2023-09-05 ENCOUNTER — Inpatient Hospital Stay (HOSPITAL_COMMUNITY)
Admission: RE | Admit: 2023-09-05 | Discharge: 2023-09-05 | Disposition: A | Discharge status: Home / Self Care | Source: Ambulatory Visit | Attending: Physician Assistant | Admitting: Physician Assistant

## 2023-09-05 ENCOUNTER — Encounter (HOSPITAL_COMMUNITY): Payer: Self-pay | Admitting: Physician Assistant

## 2023-09-05 ENCOUNTER — Ambulatory Visit (HOSPITAL_COMMUNITY)
Admission: RE | Admit: 2023-09-05 | Discharge: 2023-09-05 | Disposition: A | Discharge status: Home / Self Care | Source: Ambulatory Visit | Attending: Physician Assistant | Admitting: Physician Assistant

## 2023-09-05 ENCOUNTER — Other Ambulatory Visit (HOSPITAL_COMMUNITY): Payer: Self-pay | Admitting: Physician Assistant

## 2023-09-05 VITALS — BP 110/90 | HR 87 | Ht 72.0 in | Wt 167.6 lb

## 2023-09-05 DIAGNOSIS — D6869 Other thrombophilia: Secondary | ICD-10-CM | POA: Diagnosis not present

## 2023-09-05 DIAGNOSIS — I480 Paroxysmal atrial fibrillation: Secondary | ICD-10-CM | POA: Diagnosis not present

## 2023-09-05 DIAGNOSIS — F411 Generalized anxiety disorder: Secondary | ICD-10-CM | POA: Diagnosis not present

## 2023-09-05 NOTE — Progress Notes (Signed)
 Primary Care Physician: Lanney Pitts, DO Primary Cardiologist: None Electrophysiologist: None  Referring Physician: ED   Francisco [name] is a 63 y.o. male with a history of COPD, CAD, HTN, CVA, polysubstance abuse, atrial fibrillation who presents for consultation in the [facility].  The patient was initially diagnosed with atrial fibrillation 07/2023 in the setting of pneumonia and respiratory failure. Patient reports he was unaware of his afib but was not feeling well with his other medical issues. He was started on diltiazem  and metoprolol . He left AMA but did spontaneously convert. Patient is on Eliquis  for stroke prevention.    Patient presents today for follow up for atrial fibrillation. He remains in SR. He denies any bleeding issues on anticoagulation. He did have dizziness yesterday morning which lasted about 2 hours.   Today, he denies symptoms of palpitations, chest pain, shortness of breath, orthopnea, PND, lower extremity edema, presyncope, syncope, snoring, daytime somnolence, bleeding, or neurologic sequela. The patient is tolerating medications without difficulties and is otherwise without complaint today.    Atrial Fibrillation Risk Factors:  he does not have symptoms or diagnosis of sleep apnea. he does not have a history of rheumatic fever.   Atrial Fibrillation Management history:  Previous antiarrhythmic drugs: none Previous cardioversions: none Previous ablations: none Anticoagulation history: Eliquis   ROS- All systems are reviewed and negative except as per the HPI above.  Past Medical History:  Diagnosis Date   CHF (congestive heart failure) (HCC)    COPD (chronic obstructive pulmonary disease) (HCC)    COPD with acute exacerbation (HCC) 10/21/2021   Coronary artery disease    a. s/p prior PCI.   Hepatitis-C    Hypertension    IV drug abuse (HCC)    a. 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   Medically  noncompliant    MI (myocardial infarction) (HCC)    Tobacco abuse     Current Outpatient Medications  Medication Sig Dispense Refill   albuterol  (PROVENTIL ) (2.5 MG/3ML) 0.083% nebulizer solution Use 1 vial (2.5 mg total) by nebulization every 6 (six) hours as needed for wheezing or shortness of breath. 300 mL 2   albuterol  (VENTOLIN  HFA) 108 (90 Base) MCG/ACT inhaler Inhale 2 puffs into the lungs every 4 (four) hours as needed for wheezing or shortness of breath. (Patient taking differently: Inhale 2 puffs into the lungs See admin instructions. Inhale 2 puffs into the lungs every 1-2 hours as needed for shortness of breath or wheezing) 6.7 g 2   apixaban  (ELIQUIS ) 5 MG TABS tablet Take 1 tablet (5 mg total) by mouth 2 (two) times daily. 60 tablet 2   atorvastatin  (LIPITOR ) 40 MG tablet Take 1 tablet (40 mg total) by mouth daily. 30 tablet 2   diltiazem  (CARDIZEM  CD) 120 MG 24 hr capsule Take 1 capsule (120 mg total) by mouth daily. 30 capsule 1   fluticasone  (FLONASE ) 50 MCG/ACT nasal spray Place 1 spray into both nostrils daily. (Patient taking differently: Place 1 spray into both nostrils 2 (two) times daily as needed for allergies or rhinitis.) 16 g 0   Fluticasone -Umeclidin-Vilant (TRELEGY ELLIPTA ) 100-62.5-25 MCG/ACT AEPB Inhale 1 puff into the lungs daily. 60 each 2   loratadine  (CLARITIN ) 10 MG tablet Take 1 tablet (10 mg total) by mouth daily. (Patient taking differently: Take 10 mg by mouth daily as needed for allergies.) 100 tablet 0   methylPREDNISolone  (MEDROL  DOSEPAK) 4 MG TBPK tablet Take 6 tablets by mouth daily for 1 day, THEN 5  tablets daily for 1 day, THEN 4 tablets daily for 1 day, THEN 3 tablets daily for 1 day, THEN 2 tablets daily for 1 day, THEN 1 tablet daily for 1 day. 21 tablet 0   metoprolol  tartrate (LOPRESSOR ) 25 MG tablet Take 1 tablet (25 mg total) by mouth 2 (two) times daily. 60 tablet 1   nicotine  (NICODERM CQ  - DOSED IN MG/24 HOURS) 14 mg/24hr patch Place 1 patch  (14 mg total) onto the skin daily. 28 patch 1   nitroGLYCERIN  (NITROSTAT ) 0.4 MG SL tablet Place 1 tablet (0.4 mg total) under the tongue every 5 (five) minutes x 3 doses as needed for chest pain. 25 tablet 2   nystatin  (MYCOSTATIN ) 100000 UNIT/ML suspension Take 5 mLs (500,000 Units total) by mouth 4 (four) times daily. 60 mL 0   thiamine  (VITAMIN B-1) 100 MG tablet Take 1 tablet (100 mg total) by mouth daily. 30 tablet 1   No current facility-administered medications for this encounter.    Physical Exam: BP (!) 110/90   Pulse 87   Ht 6' (1.829 m)   Wt 76 kg   BMI 22.73 kg/m   GEN: Well nourished, well developed in no acute distress CARDIAC: Regular rate and rhythm, no murmurs, rubs, gallops RESPIRATORY:  Clear to auscultation without rales, wheezing or rhonchi  ABDOMEN: Soft, non-tender, non-distended EXTREMITIES:  No edema; No deformity   Wt Readings from Last 3 Encounters:  09/05/23 76 kg  08/30/23 77.1 kg  08/13/23 80.2 kg     EKG today demonstrates  SR Vent. rate 87 BPM PR interval 142 ms QRS duration 72 ms QT/QTcB 366/440 ms   Echo 08/14/23 demonstrated  1. Left ventricular ejection fraction, by estimation, is 60 to 65%. The  left ventricle has normal function. The left ventricle has no regional  wall motion abnormalities. There is mild left ventricular hypertrophy.  Left ventricular diastolic parameters are indeterminate.   2. Right ventricular systolic function is normal. The right ventricular  size is normal. There is normal pulmonary artery systolic pressure. The  estimated right ventricular systolic pressure is 14.6 mmHg.   3. The mitral valve is normal in structure. No evidence of mitral valve  regurgitation. No evidence of mitral stenosis.   4. The aortic valve is tricuspid. There is mild calcification of the  aortic valve. Aortic valve regurgitation is not visualized. Aortic valve  sclerosis is present, with no evidence of aortic valve stenosis.   5. The  inferior vena cava is dilated in size with >50% respiratory  variability, suggesting right atrial pressure of 8 mmHg.    CHA2DS2-VASc Score = 4  The patient's score is based upon: CHF History: 0 HTN History: 1 Diabetes History: 0 Stroke History: 2 Vascular Disease History: 1 Age Score: 0 Gender Score: 0       ASSESSMENT AND PLAN: Paroxysmal Atrial Fibrillation (ICD10:  I48.0) The patient's CHA2DS2-VASc score is 4, indicating a 4.8% annual risk of stroke.   Patient in SR today. Initially diagnosed in the setting of severe pneumonia.  General education about afib provided and questions answered. We also discussed his stroke risk and the risks and benefits of anticoagulation. Will have him wear a two week Zio monitor to evaluate burden. Continue Eliquis  5 mg BID Continue diltiazem  120 mg daily Continue Lopressor  25 mg BID  Secondary Hypercoagulable State (ICD10:  D68.69) The patient is at significant risk for stroke/thromboembolism based upon his CHA2DS2-VASc Score of 4.  Continue Apixaban  (Eliquis ). No bleeding issues.  HTN Stable on current regimen  CAD S/p PCI remotely [facility] 04/30/19  No anginal symptoms    Follow up in the AF clinic in 6 weeks.        Myrtha Ates PA-C [facility] [address] [place], [place] [address] [phone]

## 2023-09-05 NOTE — Telephone Encounter (Signed)
 I called pt  [phone] and [phone]) - no answer.

## 2023-09-05 NOTE — Telephone Encounter (Signed)
 Pt was called this am ; no answer; unable to leave a message.

## 2023-09-05 NOTE — Telephone Encounter (Signed)
 The office was closed on the 15th and 16th d/t moving to new location.

## 2023-09-07 DIAGNOSIS — F411 Generalized anxiety disorder: Secondary | ICD-10-CM | POA: Diagnosis not present

## 2023-09-08 DIAGNOSIS — F411 Generalized anxiety disorder: Secondary | ICD-10-CM | POA: Diagnosis not present

## 2023-09-12 DIAGNOSIS — F411 Generalized anxiety disorder: Secondary | ICD-10-CM | POA: Diagnosis not present

## 2023-09-13 ENCOUNTER — Other Ambulatory Visit (HOSPITAL_COMMUNITY): Payer: Self-pay

## 2023-09-13 DIAGNOSIS — F411 Generalized anxiety disorder: Secondary | ICD-10-CM | POA: Diagnosis not present

## 2023-09-14 DIAGNOSIS — F411 Generalized anxiety disorder: Secondary | ICD-10-CM | POA: Diagnosis not present

## 2023-09-15 ENCOUNTER — Encounter: Payer: Self-pay | Admitting: Pulmonary Disease

## 2023-09-15 ENCOUNTER — Ambulatory Visit (INDEPENDENT_AMBULATORY_CARE_PROVIDER_SITE_OTHER): Admitting: Pulmonary Disease

## 2023-09-15 ENCOUNTER — Telehealth: Payer: Self-pay | Admitting: *Deleted

## 2023-09-15 VITALS — BP 135/84 | HR 90 | Ht 72.0 in | Wt 175.0 lb

## 2023-09-15 DIAGNOSIS — F17210 Nicotine dependence, cigarettes, uncomplicated: Secondary | ICD-10-CM | POA: Diagnosis not present

## 2023-09-15 DIAGNOSIS — J449 Chronic obstructive pulmonary disease, unspecified: Secondary | ICD-10-CM

## 2023-09-15 NOTE — Progress Notes (Signed)
 @Patient  ID: Francisco [name], male    DOB: 02/22/1961, 63 y.o.   MRN: [id number]  Chief Complaint  Patient presents with   Consult    SOB on activity.Started couple years ago.    Referring provider: Priscella Brooms, DO  HPI:   63 y.o. man with very severe COPD whom are seen for evaluation of the same as well as dyspnea on exertion.  Multiple cardiology notes reviewed.  Most recent PCP note reviewed.  Multiple notes including discharge summary for prolonged hospitalization 07/2023 reviewed.  Multiple ED notes earlier in 2025 reviewed.  Patient longstanding smoker.  Working in 2024.  Unfortunately in the late winter/spring 2025 developed worsening symptoms.  Worsening shortness of breath.  Chest congestion.  Recurrent visits to the ED.  Often treated for exacerbations of underlying presumed COPD.  Improved reliably with steroids antibiotics and nebulizer treatments.  So severe 07/2023 led to intubation.  He was on the ventilator for a few days.  Subsequently improved.  Was seen by pulmonary Dr. Gasper Karst in clinic 08/2023.  PFTs reviewed.  This reveals very severe obstruction, air trapping, severely reduced DLCO.  Placed on Trelegy.  Since being on Trelegy has been out of the hospital for over a month now.  This seems to be an improvement.  However day-to-day symptoms seem unchanged.  We discussed his mild eosinophilia as possible reason for recent exacerbations, multiple ED visits.  We discussed being aggressive as possible to see to prevent this.  We discussed biologic therapy and he is eager to move forward with this.  He denies history of diagnosis of asthma.  He is still smoking.  Reviewed chest x-ray 1 view 05/2023 reveals clear lungs, mild hyperinflation.  Reviewed chest x-ray 2 view x-ray 25 reveals hyperinflation of the PA and the lateral clear lungs.  Reveal chest x-ray leading to prolonged hospitalization 08/09/2023 with clear lungs bilaterally.  Questionaires / Pulmonary Flowsheets:   ACT:       No data to display          MMRC:     No data to display          Epworth:      No data to display          Tests:   FENO:  No results found for: "NITRICOXIDE"  PFT:     No data to display          WALK:      No data to display          Imaging: Personally reviewed and as per EMR and discussion in this note DG Chest Port 1 View Result Date: 08/30/2023 CLINICAL DATA:  Shortness of breath. EXAM: PORTABLE CHEST 1 VIEW COMPARISON:  08/13/2023. FINDINGS: Stable cardiomediastinal silhouette. No focal consolidation, pleural effusion, or pneumothorax. No acute osseous abnormality. IMPRESSION: No acute cardiopulmonary findings. Electronically Signed   By: Mannie Seek M.D.   On: 08/30/2023 12:23    Lab Results: Personally reviewed CBC    Component Value Date/Time   WBC 9.3 08/30/2023 1019   RBC 4.64 08/30/2023 1019   HGB 15.0 08/30/2023 1033   HGB 17.5 05/18/2021 1518   HCT 44.0 08/30/2023 1033   HCT 51.9 (H) 05/18/2021 1518   PLT 210 08/30/2023 1019   PLT 316 05/18/2021 1518   MCV 94.8 08/30/2023 1019   MCV 93 05/18/2021 1518   MCH 31.5 08/30/2023 1019   MCHC 33.2 08/30/2023 1019   RDW 14.1 08/30/2023 1019   RDW  12.1 05/18/2021 1518   LYMPHSABS 2.4 08/30/2023 1019   LYMPHSABS 1.7 05/18/2021 1518   MONOABS 1.1 (H) 08/30/2023 1019   EOSABS 0.2 08/30/2023 1019   EOSABS 0.0 05/18/2021 1518   BASOSABS 0.1 08/30/2023 1019   BASOSABS 0.0 05/18/2021 1518    BMET    Component Value Date/Time   NA 142 08/30/2023 1033   NA 143 08/25/2022 1407   K 3.7 08/30/2023 1033   CL 106 08/30/2023 1019   CO2 28 08/30/2023 1019   GLUCOSE 105 (H) 08/30/2023 1019   BUN 11 08/30/2023 1019   BUN 16 08/25/2022 1407   CREATININE 1.05 08/30/2023 1019   CALCIUM  8.7 (L) 08/30/2023 1019   GFRNONAA >60 08/30/2023 1019   GFRAA >60 01/11/2020 0422    BNP    Component Value Date/Time   BNP 50.7 08/30/2023 1019    ProBNP No results found for:  "PROBNP"  Specialty Problems       Pulmonary Problems   COPD (chronic obstructive pulmonary disease) (HCC)   CAP (community acquired pneumonia)   Lung nodules   COPD exacerbation (HCC)    Allergies  Allergen Reactions   Ivp Dye [Iodinated Contrast Media] Other (See Comments)    Seizures    Tramadol Other (See Comments)    Seizures    Doxycycline  Other (See Comments)    Burning sensation to his hands.    Immunization History  Administered Date(s) Administered   Influenza,inj,Quad PF,6+ Mos 06/17/2018   Pneumococcal Polysaccharide-23 06/17/2018    Past Medical History:  Diagnosis Date   CHF (congestive heart failure) (HCC)    COPD (chronic obstructive pulmonary disease) (HCC)    COPD with acute exacerbation (HCC) 10/21/2021   Coronary artery disease    a. s/p prior PCI.   Hepatitis-C    Hypertension    IV drug abuse (HCC)    a. 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   Medically noncompliant    MI (myocardial infarction) (HCC)    Tobacco abuse     Tobacco History: Social History   Tobacco Use  Smoking Status Every Day   Current packs/day: 0.50   Types: Cigarettes   Passive exposure: Current  Smokeless Tobacco Never  Tobacco Comments   Half a pack a day.   Ready to quit: Not Answered Counseling given: Not Answered Tobacco comments: Half a pack a day.    Outpatient Encounter Medications as of 09/15/2023  Medication Sig   albuterol  (PROVENTIL ) (2.5 MG/3ML) 0.083% nebulizer solution Use 1 vial (2.5 mg total) by nebulization every 6 (six) hours as needed for wheezing or shortness of breath.   albuterol  (VENTOLIN  HFA) 108 (90 Base) MCG/ACT inhaler Inhale 2 puffs into the lungs every 4 (four) hours as needed for wheezing or shortness of breath. (Patient taking differently: Inhale 2 puffs into the lungs See admin instructions. Inhale 2 puffs into the lungs every 1-2 hours as needed for shortness of breath or wheezing)   apixaban  (ELIQUIS ) 5 MG TABS  tablet Take 1 tablet (5 mg total) by mouth 2 (two) times daily.   diltiazem  (CARDIZEM  CD) 120 MG 24 hr capsule Take 1 capsule (120 mg total) by mouth daily.   Fluticasone -Umeclidin-Vilant (TRELEGY ELLIPTA ) 100-62.5-25 MCG/ACT AEPB Inhale 1 puff into the lungs daily.   loratadine  (CLARITIN ) 10 MG tablet Take 1 tablet (10 mg total) by mouth daily. (Patient taking differently: Take 10 mg by mouth daily as needed for allergies.)   metoprolol  tartrate (LOPRESSOR ) 25 MG tablet Take 1 tablet (25 mg  total) by mouth 2 (two) times daily.   thiamine  (VITAMIN B-1) 100 MG tablet Take 1 tablet (100 mg total) by mouth daily.   atorvastatin  (LIPITOR ) 40 MG tablet Take 1 tablet (40 mg total) by mouth daily. (Patient not taking: Reported on 09/15/2023)   fluticasone  (FLONASE ) 50 MCG/ACT nasal spray Place 1 spray into both nostrils daily. (Patient not taking: Reported on 09/15/2023)   nicotine  (NICODERM CQ  - DOSED IN MG/24 HOURS) 14 mg/24hr patch Place 1 patch (14 mg total) onto the skin daily. (Patient not taking: Reported on 09/15/2023)   nitroGLYCERIN  (NITROSTAT ) 0.4 MG SL tablet Place 1 tablet (0.4 mg total) under the tongue every 5 (five) minutes x 3 doses as needed for chest pain. (Patient not taking: Reported on 09/15/2023)   nystatin  (MYCOSTATIN ) 100000 UNIT/ML suspension Take 5 mLs (500,000 Units total) by mouth 4 (four) times daily. (Patient not taking: Reported on 09/15/2023)   No facility-administered encounter medications on file as of 09/15/2023.     Review of Systems  Review of Systems  No chest pain with attrition.  No orthopnea or PND.  Comprehensive review of systems otherwise negative. Physical Exam  BP 135/84 (BP Location: Right Arm, Patient Position: Sitting, Cuff Size: Normal)   Pulse 90   Ht 6' (1.829 m)   Wt 175 lb (79.4 kg)   SpO2 90%   BMI 23.73 kg/m   Wt Readings from Last 5 Encounters:  09/15/23 175 lb (79.4 kg)  09/05/23 167 lb 9.6 oz (76 kg)  08/30/23 170 lb (77.1 kg)  08/13/23  176 lb 12.9 oz (80.2 kg)  08/13/23 173 lb 1 oz (78.5 kg)    BMI Readings from Last 5 Encounters:  09/15/23 23.73 kg/m  09/05/23 22.73 kg/m  08/30/23 23.06 kg/m  08/13/23 23.98 kg/m  08/13/23 23.47 kg/m     Physical Exam General: Sitting in chair, no acute distress Eyes: EOMI, no icterus Neck: Supple, no JVP Pulmonary: Clear, distant, normal work of breathing Cardiovascular: Warm, no edema Abdomen: Nondistended MSK: No synovitis, no joint effusion Neuro: Normal gait, no weakness Psych: Normal mood, full affect   Assessment & Plan:   Very severe COPD Gold D: FEV1 23% of predicted 08/2023.  With evidence of air trapping, severely restricted DLCO.  Appropriately on Trelegy given frequent exacerbations.  Eosinophil count of 300 2024.  Recommend starting Dupixent, biologic therapy given frequent exacerbations. Paperwork for Dupixent today.  May be limited by [facility] formulary.  Recently ambulated at other pulmonary office without desaturation.  Consider ambulation in the future to assess for oxygen  need.  Dyspnea on exertion: Likely multifactorial due to significant and severe COPD as well as cardiac etiologies.  Will tend to be as aggressive as possible from a pulmonary standpoint to see if we can improve this.  Return in about 3 months (around 12/16/2023) for f/u Dr. Marygrace Snellen.   Guerry Leek, MD 09/15/2023   This appointment required 41 minutes of patient care (this includes precharting, chart review, review of results, face-to-face care, etc.).

## 2023-09-15 NOTE — Patient Instructions (Signed)
 Nice to meet you  I think the Trelegy is a great choice for the COPD I am glad it is helping a bit  I think an effort to be aggressive and prevent future hospitalizations we should try injection medicines  Paperwork today for Dupixent, this is approved to treat COPD to help prevent worsening.  This is based on mild elevation in eosinophils seen on your blood work.  There are other things we can discuss and potentially add in the future but lets start with this for now  Assuming the new injection medicine gets approved, we will get you scheduled with our pharmacist for the first dose here and then hopefully we can have it mailed to your house and you can administer it yourself in the future.  Return to clinic in 3 months or sooner as needed with Dr. Marygrace Snellen

## 2023-09-15 NOTE — Telephone Encounter (Signed)
 Copied from CRM [id number]. Topic: General - Other >> Sep 15, 2023  3:03 PM Adrianna P wrote: Reason for CRM: Patient wanted to make dr aware that he was seen at [facility] today, and the plan is to put him on dupixent, once insurance approves it

## 2023-09-16 DIAGNOSIS — F411 Generalized anxiety disorder: Secondary | ICD-10-CM | POA: Diagnosis not present

## 2023-09-20 ENCOUNTER — Other Ambulatory Visit (HOSPITAL_COMMUNITY): Payer: Self-pay

## 2023-09-20 DIAGNOSIS — F411 Generalized anxiety disorder: Secondary | ICD-10-CM | POA: Diagnosis not present

## 2023-09-21 ENCOUNTER — Other Ambulatory Visit (HOSPITAL_COMMUNITY): Payer: Self-pay

## 2023-09-21 ENCOUNTER — Telehealth: Payer: Self-pay

## 2023-09-21 DIAGNOSIS — J449 Chronic obstructive pulmonary disease, unspecified: Secondary | ICD-10-CM

## 2023-09-21 DIAGNOSIS — F411 Generalized anxiety disorder: Secondary | ICD-10-CM | POA: Diagnosis not present

## 2023-09-21 NOTE — Telephone Encounter (Signed)
 Received New start paperwork for DUPIXENT. Will update as we work through the benefits process.  Submitted a Prior Authorization request to [facility] for DUPIXENT via CoverMyMeds. Will update once we receive a response.  Key: [id number]

## 2023-09-22 DIAGNOSIS — F411 Generalized anxiety disorder: Secondary | ICD-10-CM | POA: Diagnosis not present

## 2023-09-26 ENCOUNTER — Ambulatory Visit (HOSPITAL_COMMUNITY): Payer: Self-pay | Admitting: Physician Assistant

## 2023-09-26 ENCOUNTER — Other Ambulatory Visit: Payer: Self-pay | Admitting: Student

## 2023-09-26 NOTE — Addendum Note (Signed)
 Encounter addended by: Missouri Amor, RN on: 09/26/2023 11:18 AM  Actions taken: Imaging Exam ended

## 2023-09-26 NOTE — Telephone Encounter (Signed)
 Received a fax regarding Prior Authorization from [employer] for DUPIXENT. Authorization has been DENIED because the following criteria was unmet:  Diagnosis of chronic obstructive pulmonary disease (COPD) as evidenced by one of the following (a or b): Postbronchodilator ratio of the forced expiratory volume in 1 second (FEV1)/forced vital capacity (FVC) less than 0.7; Postbronchodilator FEV1 at least 30% and less than or equal to 70% of predicted normal Member meets one of the following (a or b), unless clincally significant adverse effects are experienced or all are contraindicated: Failure of triple inhaled therapy at up to maximally indicated doses for at least 3 months; If member is contraindicated to ICS, failure of dual inhaled therapy consisting of a combination LABA + LAMA, at up to maximally indicated doses for at least 3 months  Chart notes CAN be sent in to dispute criteria #1 (PFT performed at [facility] clinic); however, pt initiated treatment with Trelegy on 08/03/23, therefore we cannot dispute criteria #2 until ~11/02/23 at the very earliest. Additional chart notes will likely be required in order to prove that condition remains unchanged despite the 3 months of triple inhaler therapy. Denial letter will be sent to scan center for reference.  Peer-to-Peer Phone# [phone]  Requests for reconsideration can be sent to [phone]. Reference tracking ID# [id number].

## 2023-09-27 ENCOUNTER — Other Ambulatory Visit (HOSPITAL_COMMUNITY): Payer: Self-pay

## 2023-09-27 MED ORDER — FLUTICASONE PROPIONATE 50 MCG/ACT NA SUSP
1.0000 | Freq: Every day | NASAL | 0 refills | Status: DC
  Filled 2023-09-27: qty 16, 60d supply, fill #0

## 2023-09-27 NOTE — Telephone Encounter (Signed)
 Medication sent to pharmacy

## 2023-09-28 ENCOUNTER — Other Ambulatory Visit: Payer: Self-pay

## 2023-09-28 ENCOUNTER — Emergency Department (HOSPITAL_COMMUNITY)
Admission: EM | Admit: 2023-09-28 | Discharge: 2023-09-28 | Disposition: A | Discharge status: Home / Self Care | Attending: Emergency Medicine | Admitting: Emergency Medicine

## 2023-09-28 ENCOUNTER — Encounter (HOSPITAL_COMMUNITY): Payer: Self-pay

## 2023-09-28 ENCOUNTER — Other Ambulatory Visit (HOSPITAL_COMMUNITY): Payer: Self-pay

## 2023-09-28 ENCOUNTER — Emergency Department (HOSPITAL_COMMUNITY)

## 2023-09-28 ENCOUNTER — Ambulatory Visit: Payer: Self-pay

## 2023-09-28 DIAGNOSIS — Z7952 Long term (current) use of systemic steroids: Secondary | ICD-10-CM | POA: Diagnosis not present

## 2023-09-28 DIAGNOSIS — J441 Chronic obstructive pulmonary disease with (acute) exacerbation: Secondary | ICD-10-CM | POA: Diagnosis not present

## 2023-09-28 DIAGNOSIS — Z7901 Long term (current) use of anticoagulants: Secondary | ICD-10-CM | POA: Insufficient documentation

## 2023-09-28 DIAGNOSIS — Z7951 Long term (current) use of inhaled steroids: Secondary | ICD-10-CM | POA: Diagnosis not present

## 2023-09-28 DIAGNOSIS — R0602 Shortness of breath: Secondary | ICD-10-CM | POA: Diagnosis present

## 2023-09-28 DIAGNOSIS — M546 Pain in thoracic spine: Secondary | ICD-10-CM | POA: Insufficient documentation

## 2023-09-28 LAB — COMPREHENSIVE METABOLIC PANEL WITH GFR
ALT: 83 U/L — ABNORMAL HIGH (ref 0–44)
AST: 39 U/L (ref 15–41)
Albumin: 3.2 g/dL — ABNORMAL LOW (ref 3.5–5.0)
Alkaline Phosphatase: 72 U/L (ref 38–126)
Anion gap: 10 (ref 5–15)
BUN: 7 mg/dL — ABNORMAL LOW (ref 8–23)
CO2: 26 mmol/L (ref 22–32)
Calcium: 8.8 mg/dL — ABNORMAL LOW (ref 8.9–10.3)
Chloride: 104 mmol/L (ref 98–111)
Creatinine, Ser: 1.01 mg/dL (ref 0.61–1.24)
GFR, Estimated: >60 mL/min (ref >60)
Glucose, Bld: 105 mg/dL — ABNORMAL HIGH (ref 70–99)
Potassium: 4.2 mmol/L (ref 3.5–5.1)
Sodium: 140 mmol/L (ref 135–145)
Total Bilirubin: 0.5 mg/dL (ref 0.0–1.2)
Total Protein: 6.5 g/dL (ref 6.5–8.1)

## 2023-09-28 LAB — CBC
HCT: 45.2 % (ref 39.0–52.0)
Hemoglobin: 14.9 g/dL (ref 13.0–17.0)
MCH: 31.5 pg (ref 26.0–34.0)
MCHC: 33 g/dL (ref 30.0–36.0)
MCV: 95.6 fL (ref 80.0–100.0)
Platelets: 285 10*3/uL (ref 150–400)
RBC: 4.73 MIL/uL (ref 4.22–5.81)
RDW: 13.8 % (ref 11.5–15.5)
WBC: 11.7 10*3/uL — ABNORMAL HIGH (ref 4.0–10.5)
nRBC: 0 % (ref 0.0–0.2)

## 2023-09-28 LAB — BRAIN NATRIURETIC PEPTIDE: B Natriuretic Peptide: 77.6 pg/mL (ref 0.0–100.0)

## 2023-09-28 LAB — TROPONIN I (HIGH SENSITIVITY)
Troponin I (High Sensitivity): 11 ng/L (ref <18)
Troponin I (High Sensitivity): 11 ng/L (ref <18)

## 2023-09-28 MED ORDER — PREDNISONE 50 MG PO TABS
50.0000 mg | ORAL_TABLET | Freq: Every day | ORAL | 0 refills | Status: AC
  Filled 2023-09-28: qty 3, 3d supply, fill #0

## 2023-09-28 MED ORDER — PREDNISONE 20 MG PO TABS
60.0000 mg | ORAL_TABLET | Freq: Once | ORAL | Status: AC
  Administered 2023-09-28: 60 mg via ORAL
  Filled 2023-09-28: qty 3

## 2023-09-28 MED ORDER — OXYCODONE-ACETAMINOPHEN 5-325 MG PO TABS
1.0000 | ORAL_TABLET | Freq: Once | ORAL | Status: AC
  Administered 2023-09-28: 1 via ORAL
  Filled 2023-09-28: qty 1

## 2023-09-28 NOTE — ED Notes (Signed)
 This paramedic went to provide AVS and patient was gone.

## 2023-09-28 NOTE — ED Provider Notes (Signed)
 [place] [facility] AT [facility] Provider Note   CSN: [id number] Arrival date & time: 09/28/23  1154     History  Chief Complaint  Patient presents with   Shortness of Breath    Francisco [name] is a 63 y.o. male.  With a history of dilated cardiomyopathy, COPD, a flutter pulmonary hypertension and status post back and neck surgery presents to the ED for back pain.  4 days of increased coughing with clear sputum at home.  No fevers chills chest pain shortness of breath.  Has been using his nebulized albuterol  at home.  Pain over the thoracic back when coughing.  Some mild discomfort at rest.  No other acute injuries or complaints at this time.   Shortness of Breath      Home Medications Prior to Admission medications   Medication Sig Start Date End Date Taking? Authorizing Provider  predniSONE  (DELTASONE ) 50 MG tablet Take 1 tablet (50 mg total) by mouth daily with breakfast for 3 days. 09/28/23 10/01/23 Yes Sallyanne Creamer, DO  albuterol  (PROVENTIL ) (2.5 MG/3ML) 0.083% nebulizer solution Use 1 vial (2.5 mg total) by nebulization every 6 (six) hours as needed for wheezing or shortness of breath. 06/16/23   Atway, Rayann N, DO  albuterol  (VENTOLIN  HFA) 108 (90 Base) MCG/ACT inhaler Inhale 2 puffs into the lungs every 4 (four) hours as needed for wheezing or shortness of breath. Patient taking differently: Inhale 2 puffs into the lungs See admin instructions. Inhale 2 puffs into the lungs every 1-2 hours as needed for shortness of breath or wheezing 06/10/23   Nooruddin, Saad, MD  apixaban  (ELIQUIS ) 5 MG TABS tablet Take 1 tablet (5 mg total) by mouth 2 (two) times daily. 08/15/23   Krishnan, Gokul, MD  atorvastatin  (LIPITOR ) 40 MG tablet Take 1 tablet (40 mg total) by mouth daily. Patient not taking: Reported on 09/15/2023 07/06/23   Lanney Pitts, DO  diltiazem  (CARDIZEM  CD) 120 MG 24 hr capsule Take 1 capsule (120 mg total) by mouth daily. 08/15/23   Krishnan, Gokul,  MD  fluticasone  (FLONASE ) 50 MCG/ACT nasal spray Place 1 spray into both nostrils daily. 09/27/23   [name], [name], DO  Fluticasone -Umeclidin-Vilant (TRELEGY ELLIPTA ) 100-62.5-25 MCG/ACT AEPB Inhale 1 puff into the lungs daily. 07/28/23 11/01/23  [name], [name], DO  loratadine  (CLARITIN ) 10 MG tablet Take 1 tablet (10 mg total) by mouth daily. Patient taking differently: Take 10 mg by mouth daily as needed for allergies. 07/28/23 07/27/24  [name], [name], DO  metoprolol  tartrate (LOPRESSOR ) 25 MG tablet Take 1 tablet (25 mg total) by mouth 2 (two) times daily. 08/15/23   Krishnan, Gokul, MD  nicotine  (NICODERM CQ  - DOSED IN MG/24 HOURS) 14 mg/24hr patch Place 1 patch (14 mg total) onto the skin daily. Patient not taking: Reported on 09/15/2023 07/28/23   [name], [name], DO  nitroGLYCERIN  (NITROSTAT ) 0.4 MG SL tablet Place 1 tablet (0.4 mg total) under the tongue every 5 (five) minutes x 3 doses as needed for chest pain. Patient not taking: Reported on 09/15/2023 10/14/22   Nooruddin, Saad, MD  nystatin  (MYCOSTATIN ) 100000 UNIT/ML suspension Take 5 mLs (500,000 Units total) by mouth 4 (four) times daily. Patient not taking: Reported on 09/15/2023 08/16/23   [name], [name], DO  thiamine  (VITAMIN B-1) 100 MG tablet Take 1 tablet (100 mg total) by mouth daily. 08/15/23   Krishnan, Gokul, MD      Allergies    Ivp dye [iodinated contrast media], Tramadol, and Doxycycline   Review of Systems   Review of Systems  Respiratory:  Positive for shortness of breath.     Physical Exam Updated Vital Signs BP (!) 156/102 (BP Location: Right Arm)   Pulse 84   Temp 97.6 F (36.4 C) (Oral)   Resp 12   Ht 6' (1.829 m)   Wt 79.4 kg   SpO2 100%   BMI 23.73 kg/m  Physical Exam Vitals and nursing note reviewed.  HENT:     Head: Normocephalic and atraumatic.  Eyes:     Pupils: Pupils are equal, round, and reactive to light.  Cardiovascular:     Rate and Rhythm: Normal rate and regular rhythm.   Pulmonary:     Effort: Pulmonary effort is normal.     Breath sounds: Examination of the right-lower field reveals wheezing. Examination of the left-lower field reveals wheezing. Decreased breath sounds and wheezing present.  Abdominal:     Palpations: Abdomen is soft.     Tenderness: There is no abdominal tenderness.  Musculoskeletal:     Comments: Midline and paraspinal mid thoracic tenderness to palpation without step-off deformity  Skin:    General: Skin is warm and dry.  Neurological:     Mental Status: He is alert.  Psychiatric:        Mood and Affect: Mood normal.     ED Results / Procedures / Treatments   Labs (all labs ordered are listed, but only abnormal results are displayed) Labs Reviewed  CBC - Abnormal; Notable for the following components:      Result Value   WBC 11.7 (*)    All other components within normal limits  COMPREHENSIVE METABOLIC PANEL WITH GFR - Abnormal; Notable for the following components:   Glucose, Bld 105 (*)    BUN 7 (*)    Calcium  8.8 (*)    Albumin  3.2 (*)    ALT 83 (*)    All other components within normal limits  BRAIN NATRIURETIC PEPTIDE  TROPONIN I (HIGH SENSITIVITY)  TROPONIN I (HIGH SENSITIVITY)    EKG None  Radiology DG Chest 1 View Result Date: 09/28/2023 CLINICAL DATA:  Cough for 2 days. EXAM: CHEST  1 VIEW COMPARISON:  Aug 30, 2023. FINDINGS: The heart size and mediastinal contours are within normal limits. Both lungs are clear. The visualized skeletal structures are unremarkable. IMPRESSION: No active disease. Electronically Signed   By: Rosalene Colon M.D.   On: 09/28/2023 12:52    Procedures Procedures    Medications Ordered in ED Medications  oxyCODONE -acetaminophen  (PERCOCET/ROXICET) 5-325 MG per tablet 1 tablet (1 tablet Oral Given 09/28/23 1613)  predniSONE  (DELTASONE ) tablet 60 mg (60 mg Oral Given 09/28/23 1612)    ED Course/ Medical Decision Making/ A&P Clinical Course as of 09/28/23 1637  Wed Sep 28, 2023  1637 Delta troponin flat.  Laboratory workup unremarkable.  Chest x-ray shows no acute disease.  Suspect this most likely musculoskeletal pain in his back caused by prolonged coughing.  Will give outpatient course of steroids and instruct for PCP follow-up [MP]    Clinical Course User Index [MP] Sallyanne Creamer, DO                                 Medical Decision Making 63 year old male with history as above presenting for back pain worse with coughing.  Mild COPD exacerbation with increased coughing.  No real shortness of breath at rest.  Only has back pain with coughing over thoracic region.  Paraspinal and midline mid thoracic tenderness without step-off deformity.  Low suspicion for acute osseous injury.  Given reported back pain and cardiac risk factors will obtain ACS workup and obtain chest x-ray to look for any evidence of pneumonia he is satting well on room air and has nebulized albuterol  at home.  Will give him a dose of prednisone  here and a couple more days to help with COPD exacerbation and perhaps a back pain as well.  Will give a dose of Percocet here for pain relief and continue to monitor.  Risk Prescription drug management.           Final Clinical Impression(s) / ED Diagnoses Final diagnoses:  COPD exacerbation (HCC)  Acute thoracic back pain, unspecified back pain laterality    Rx / DC Orders ED Discharge Orders          Ordered    predniSONE  (DELTASONE ) 50 MG tablet  Daily with breakfast        09/28/23 1635              Sallyanne Creamer, DO 09/28/23 1637

## 2023-09-28 NOTE — Telephone Encounter (Signed)
 FYI Only or Action Required?: FYI only for provider  Patient was last seen in primary care on 08/16/2023 by Lanney Pitts, DO. Called Nurse Triage reporting No chief complaint on file.. Symptoms began several days ago. Interventions attempted: OTC medications: Tylenol , Ibuprofen . Symptoms are: rapidly worsening.  Triage Disposition: See HCP Within 4 Hours (Or PCP Triage)  Patient/caregiver understands and will follow disposition?: YesLas Patient/caregiver understands and will follow disposition?: Yes   Copied from CRM [id number]. Topic: Clinical - Red Word Triage >> Sep 28, 2023 10:00 AM Karole Pacer C wrote: Red Word that prompted transfer to Nurse Triage: Patient is having some back pain that he thinks it associated with his COPD. Reason for Disposition  [1] SEVERE back pain (e.g., excruciating, unable to do any normal activities) AND [2] not improved 2 hours after pain medicine  Answer Assessment - Initial Assessment Questions 1. ONSET: When did the pain begin?      Last Two Days  2. LOCATION: Where does it hurt? (upper, mid or lower back)     Middle  3. SEVERITY: How bad is the pain?  (e.g., Scale 1-10; mild, moderate, or severe)   - MILD (1-3): Doesn't interfere with normal activities.    - MODERATE (4-7): Interferes with normal activities or awakens from sleep.    - SEVERE (8-10): Excruciating pain, unable to do any normal activities.      Severe  4. PATTERN: Is the pain constant? (e.g., yes, no; constant, intermittent)      Constant  5. RADIATION: Does the pain shoot into your legs or somewhere else?     No  6. CAUSE:  What do you think is causing the back pain?      Unsure  7. BACK OVERUSE:  Any recent lifting of heavy objects, strenuous work or exercise?     No  8. MEDICINES: What have you taken so far for the pain? (e.g., nothing, acetaminophen , NSAIDS)     Tylenol , Ibuprofen   9. NEUROLOGIC SYMPTOMS: Do you have any weakness, numbness, or problems with  bowel/bladder control?     No  10. OTHER SYMPTOMS: Do you have any other symptoms? (e.g., fever, abdomen pain, burning with urination, blood in urine)       No  Protocols used: Back Pain-A-AH

## 2023-09-28 NOTE — ED Triage Notes (Signed)
 Pt states SHOB and every time he coughs he has middle back pain. Hx of COPD. Denies CP.

## 2023-09-28 NOTE — ED Provider Triage Note (Signed)
 Emergency Medicine Provider Triage Evaluation Note  Francisco [name] , a 63 y.o. male  was evaluated in triage.  Pt complains of shob. H/o COPD.  Started 2 days ago.  Also states he usually has a cough but it has been much worse and more productive in the last 2 days.  Denies fever.  Denies chest pain but does states he has upper to mid back pain that started couple days as well with the coughing.  He said it does not radiate into the chest.  Review of Systems  Positive: See above Negative: See above  Physical Exam  BP (!) 173/106   Pulse 87   Temp 98.3 F (36.8 C)   Resp (!) 22   Ht 6' (1.829 m)   Wt 79.4 kg   SpO2 96%   BMI 23.73 kg/m  Gen:   Awake, no distress   Resp:  Normal effort  MSK:   Moves extremities without difficulty  Other:    Medical Decision Making  Medically screening exam initiated at 12:17 PM.  Appropriate orders placed.  Francisco [name] was informed that the remainder of the evaluation will be completed by another provider, this initial triage assessment does not replace that evaluation, and the importance of remaining in the ED until their evaluation is complete.  Work up started   Janalee Mcmurray, PA-C 09/28/23 1219

## 2023-09-28 NOTE — Discharge Instructions (Signed)
 You were seen in the ED for coughing and back pain Your blood work EKG and chest x-ray looked okay We gave you a dose of steroids here and a medication for back pain We have called in 3 more days of steroids for you to take as this is likely caused by COPD exacerbation Continue using your albuterol  nebulizer at home Return to the emergency room for severe pain trouble breathing chest pain or any other concerns Otherwise follow-up with your primary care doctor within 1 week

## 2023-09-28 NOTE — Telephone Encounter (Signed)
 Call to patient when talking briefly to patient is short of breath.  Patient was advised to go to the ER. Patient states is going to go to the ER now.

## 2023-09-29 ENCOUNTER — Encounter: Admitting: Student

## 2023-09-29 ENCOUNTER — Telehealth: Payer: Self-pay | Admitting: *Deleted

## 2023-09-29 DIAGNOSIS — Z419 Encounter for procedure for purposes other than remedying health state, unspecified: Secondary | ICD-10-CM | POA: Diagnosis not present

## 2023-09-29 NOTE — Telephone Encounter (Signed)
 Called patient to advise that DPX was denied due to not being on ICS/LABA/LAMA for 3 months. Advised that we can retry in mid-July through [facility] and that unfortunately Medicaid is quite strict with their criteria.  Will retain encounter in pharmacy team mailbox to pursue in July 2025  Geraldene Kleine, PharmD, MPH, BCPS, CPP Clinical Pharmacist (Rheumatology and Pulmonology)

## 2023-09-29 NOTE — Telephone Encounter (Signed)
 Copied from CRM [id number]. Topic: Clinical - Prescription Issue >> Sep 28, 2023  9:49 AM Ambrose Junk wrote: Reason for CRM: Patient called to check the status of the Dupixent.  Patient would like a call back to discuss.

## 2023-10-01 ENCOUNTER — Other Ambulatory Visit: Payer: Self-pay

## 2023-10-01 ENCOUNTER — Emergency Department (HOSPITAL_COMMUNITY)

## 2023-10-01 ENCOUNTER — Encounter (HOSPITAL_COMMUNITY): Payer: Self-pay

## 2023-10-01 ENCOUNTER — Other Ambulatory Visit (HOSPITAL_BASED_OUTPATIENT_CLINIC_OR_DEPARTMENT_OTHER): Payer: Self-pay

## 2023-10-01 ENCOUNTER — Emergency Department (HOSPITAL_COMMUNITY)
Admission: EM | Admit: 2023-10-01 | Discharge: 2023-10-01 | Disposition: A | Discharge status: Home / Self Care | Attending: Emergency Medicine | Admitting: Emergency Medicine

## 2023-10-01 DIAGNOSIS — Z7951 Long term (current) use of inhaled steroids: Secondary | ICD-10-CM | POA: Insufficient documentation

## 2023-10-01 DIAGNOSIS — I110 Hypertensive heart disease with heart failure: Secondary | ICD-10-CM | POA: Insufficient documentation

## 2023-10-01 DIAGNOSIS — I509 Heart failure, unspecified: Secondary | ICD-10-CM | POA: Diagnosis not present

## 2023-10-01 DIAGNOSIS — J441 Chronic obstructive pulmonary disease with (acute) exacerbation: Secondary | ICD-10-CM | POA: Insufficient documentation

## 2023-10-01 DIAGNOSIS — D72829 Elevated white blood cell count, unspecified: Secondary | ICD-10-CM | POA: Diagnosis not present

## 2023-10-01 DIAGNOSIS — Z79899 Other long term (current) drug therapy: Secondary | ICD-10-CM | POA: Diagnosis not present

## 2023-10-01 DIAGNOSIS — R0602 Shortness of breath: Secondary | ICD-10-CM | POA: Diagnosis present

## 2023-10-01 DIAGNOSIS — Z7901 Long term (current) use of anticoagulants: Secondary | ICD-10-CM | POA: Insufficient documentation

## 2023-10-01 DIAGNOSIS — I2510 Atherosclerotic heart disease of native coronary artery without angina pectoris: Secondary | ICD-10-CM | POA: Insufficient documentation

## 2023-10-01 LAB — CBC WITH DIFFERENTIAL/PLATELET
Abs Immature Granulocytes: 0.04 10*3/uL (ref 0.00–0.07)
Basophils Absolute: 0.1 10*3/uL (ref 0.0–0.1)
Basophils Relative: 1 %
Eosinophils Absolute: 0.2 10*3/uL (ref 0.0–0.5)
Eosinophils Relative: 2 %
HCT: 44.2 % (ref 39.0–52.0)
Hemoglobin: 14.6 g/dL (ref 13.0–17.0)
Immature Granulocytes: 0 %
Lymphocytes Relative: 25 %
Lymphs Abs: 2.8 10*3/uL (ref 0.7–4.0)
MCH: 32 pg (ref 26.0–34.0)
MCHC: 33 g/dL (ref 30.0–36.0)
MCV: 96.9 fL (ref 80.0–100.0)
Monocytes Absolute: 1 10*3/uL (ref 0.1–1.0)
Monocytes Relative: 9 %
Neutro Abs: 6.9 10*3/uL (ref 1.7–7.7)
Neutrophils Relative %: 63 %
Platelets: 252 10*3/uL (ref 150–400)
RBC: 4.56 MIL/uL (ref 4.22–5.81)
RDW: 14.1 % (ref 11.5–15.5)
WBC: 11 10*3/uL — ABNORMAL HIGH (ref 4.0–10.5)
nRBC: 0 % (ref 0.0–0.2)

## 2023-10-01 LAB — BASIC METABOLIC PANEL WITH GFR
Anion gap: 9 (ref 5–15)
BUN: 8 mg/dL (ref 8–23)
CO2: 30 mmol/L (ref 22–32)
Calcium: 9.3 mg/dL (ref 8.9–10.3)
Chloride: 103 mmol/L (ref 98–111)
Creatinine, Ser: 0.94 mg/dL (ref 0.61–1.24)
GFR, Estimated: >60 mL/min (ref >60)
Glucose, Bld: 131 mg/dL — ABNORMAL HIGH (ref 70–99)
Potassium: 3.7 mmol/L (ref 3.5–5.1)
Sodium: 142 mmol/L (ref 135–145)

## 2023-10-01 LAB — BRAIN NATRIURETIC PEPTIDE: B Natriuretic Peptide: 83.5 pg/mL (ref 0.0–100.0)

## 2023-10-01 MED ORDER — AZITHROMYCIN 250 MG PO TABS
ORAL_TABLET | ORAL | 0 refills | Status: AC
  Filled 2023-10-01: qty 6, 5d supply, fill #0

## 2023-10-01 MED ORDER — IPRATROPIUM-ALBUTEROL 0.5-2.5 (3) MG/3ML IN SOLN
3.0000 mL | Freq: Once | RESPIRATORY_TRACT | Status: AC
  Administered 2023-10-01: 3 mL via RESPIRATORY_TRACT
  Filled 2023-10-01: qty 3

## 2023-10-01 MED ORDER — PREDNISONE 10 MG PO TABS
ORAL_TABLET | ORAL | 0 refills | Status: AC
  Filled 2023-10-01: qty 42, 12d supply, fill #0

## 2023-10-01 MED ORDER — MAGNESIUM SULFATE 2 GM/50ML IV SOLN
2.0000 g | Freq: Once | INTRAVENOUS | Status: AC
  Administered 2023-10-01: 2 g via INTRAVENOUS
  Filled 2023-10-01: qty 50

## 2023-10-01 MED ORDER — METHYLPREDNISOLONE SODIUM SUCC 125 MG IJ SOLR
125.0000 mg | INTRAMUSCULAR | Status: AC
  Administered 2023-10-01: 125 mg via INTRAVENOUS
  Filled 2023-10-01: qty 2

## 2023-10-01 NOTE — ED Notes (Signed)
 Walking spo2 93-94% provider made aware.

## 2023-10-01 NOTE — Discharge Instructions (Signed)
 You were evaluated in the emergency room for shortness of breath.  Your workup was consistent with a COPD exacerbation.  Prescription for prednisone  and antibiotics has been sent into your pharmacy.  They were there should you need them.  Otherwise please follow with your primary care doctor and return if you experience any worsening symptoms.

## 2023-10-01 NOTE — ED Triage Notes (Signed)
 PT arrived VIA EMS for SOB. PT was low 90s upon EMS arrival. PT is alert and talking. COPD and states he dosent wear O2 at home. PT states he has had some bad lung problems and CHF, PT arrived on 8L receiving a duoned. PT O2 sat was 100% upon arrival

## 2023-10-01 NOTE — ED Provider Notes (Signed)
 [place] EMERGENCY DEPARTMENT AT [facility] Provider Note   CSN: [id number] Arrival date & time: 10/01/23  2841     Patient presents with: Shortness of Breath   Francisco [name] is a 63 y.o. male with medical history includes COPD, CHF, CAD, PAD [place], hypertension, IV drug use, medical noncompliance.  Patient presents to ED for evaluation of shortness of breath.  Reports that around 130 this morning he was woken up with shortness of breath.  States he has a history of COPD, reports he still smokes half a pack a day.  Denies home oxygen  use.  Endorsing lightheadedness and dizziness on standing.  Denies chest pain.  Denies nausea, vomiting, diarrhea abdominal pain.  Denies leg swelling.  States he is supposed to take fluid pill but has not taken it in months.  Denies any orthopnea.    Shortness of Breath Associated symptoms: no chest pain        Prior to Admission medications   Medication Sig Start Date End Date Taking? Authorizing Provider  albuterol  (PROVENTIL ) (2.5 MG/3ML) 0.083% nebulizer solution Use 1 vial (2.5 mg total) by nebulization every 6 (six) hours as needed for wheezing or shortness of breath. 06/16/23   Atway, Rayann N, DO  albuterol  (VENTOLIN  HFA) 108 (90 Base) MCG/ACT inhaler Inhale 2 puffs into the lungs every 4 (four) hours as needed for wheezing or shortness of breath. Patient taking differently: Inhale 2 puffs into the lungs See admin instructions. Inhale 2 puffs into the lungs every 1-2 hours as needed for shortness of breath or wheezing 06/10/23   Nooruddin, Saad, MD  apixaban  (ELIQUIS ) 5 MG TABS tablet Take 1 tablet (5 mg total) by mouth 2 (two) times daily. 08/15/23   Krishnan, Gokul, MD  atorvastatin  (LIPITOR ) 40 MG tablet Take 1 tablet (40 mg total) by mouth daily. Patient not taking: Reported on 09/15/2023 07/06/23   Lanney Pitts, DO  diltiazem  (CARDIZEM  CD) 120 MG 24 hr capsule Take 1 capsule (120 mg total) by mouth daily. 08/15/23   Krishnan, Gokul,  MD  fluticasone  (FLONASE ) 50 MCG/ACT nasal spray Place 1 spray into both nostrils daily. 09/27/23   [name], [name], DO  Fluticasone -Umeclidin-Vilant (TRELEGY ELLIPTA ) 100-62.5-25 MCG/ACT AEPB Inhale 1 puff into the lungs daily. 07/28/23 11/01/23  [name], [name], DO  loratadine  (CLARITIN ) 10 MG tablet Take 1 tablet (10 mg total) by mouth daily. Patient taking differently: Take 10 mg by mouth daily as needed for allergies. 07/28/23 07/27/24  [name], [name], DO  metoprolol  tartrate (LOPRESSOR ) 25 MG tablet Take 1 tablet (25 mg total) by mouth 2 (two) times daily. 08/15/23   Krishnan, Gokul, MD  nicotine  (NICODERM CQ  - DOSED IN MG/24 HOURS) 14 mg/24hr patch Place 1 patch (14 mg total) onto the skin daily. Patient not taking: Reported on 09/15/2023 07/28/23   [name], [name], DO  nitroGLYCERIN  (NITROSTAT ) 0.4 MG SL tablet Place 1 tablet (0.4 mg total) under the tongue every 5 (five) minutes x 3 doses as needed for chest pain. Patient not taking: Reported on 09/15/2023 10/14/22   Nooruddin, Saad, MD  nystatin  (MYCOSTATIN ) 100000 UNIT/ML suspension Take 5 mLs (500,000 Units total) by mouth 4 (four) times daily. Patient not taking: Reported on 09/15/2023 08/16/23   [name], [name], DO  predniSONE  (DELTASONE ) 50 MG tablet Take 1 tablet (50 mg total) by mouth daily with breakfast for 3 days. 09/28/23 10/01/23  Sallyanne Creamer, DO  thiamine  (VITAMIN B-1) 100 MG tablet Take 1 tablet (100 mg total) by mouth daily. 08/15/23  Maylene Spear, MD    Allergies: Ivp dye [iodinated contrast media], Tramadol, and Doxycycline     Review of Systems  Respiratory:  Positive for shortness of breath.   Cardiovascular:  Negative for chest pain.  Neurological:  Positive for dizziness and light-headedness.  All other systems reviewed and are negative.   Updated Vital Signs BP (!) 168/79   Pulse 88   Temp 97.9 F (36.6 C)   Resp (!) 22   Ht 6' (1.829 m)   Wt 79.5 kg   SpO2 100%   BMI 23.77 kg/m   Physical  Exam Vitals and nursing note reviewed.  Constitutional:      General: He is not in acute distress.    Appearance: He is well-developed.  HENT:     Head: Normocephalic and atraumatic.   Eyes:     Conjunctiva/sclera: Conjunctivae normal.    Cardiovascular:     Rate and Rhythm: Normal rate and regular rhythm.     Heart sounds: No murmur heard. Pulmonary:     Effort: Pulmonary effort is normal. No respiratory distress.     Breath sounds: Wheezing present.  Abdominal:     Palpations: Abdomen is soft.     Tenderness: There is no abdominal tenderness.   Musculoskeletal:        General: No swelling.     Cervical back: Neck supple.     Right lower leg: No edema.     Left lower leg: No edema.   Skin:    General: Skin is warm and dry.     Capillary Refill: Capillary refill takes less than 2 seconds.   Neurological:     Mental Status: He is alert and oriented to person, place, and time. Mental status is at baseline.   Psychiatric:        Mood and Affect: Mood normal.     (all labs ordered are listed, but only abnormal results are displayed) Labs Reviewed  BASIC METABOLIC PANEL WITH GFR - Abnormal; Notable for the following components:      Result Value   Glucose, Bld 131 (*)    All other components within normal limits  CBC WITH DIFFERENTIAL/PLATELET - Abnormal; Notable for the following components:   WBC 11.0 (*)    All other components within normal limits  BRAIN NATRIURETIC PEPTIDE    EKG: None  Radiology: DG Chest 2 View Result Date: 10/01/2023 CLINICAL DATA:  Shortness of breath. EXAM: CHEST - 2 VIEW COMPARISON:  Portable chest 09/28/2023 FINDINGS: The heart size and mediastinal contours are within normal limits. Both lungs are slightly emphysematous but clear. The bones are mildly demineralized. There is mild thoracic kyphosis and mild anterior wedging of the T7-9 vertebrae. This appears chronic, and was seen on PA and lateral chest 07/20/2023. IMPRESSION: 1. No  evidence of acute chest disease. Emphysema. 2. 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 Electronically Signed   By: Denman Fischer M.D.   On: 10/01/2023 06:07    Procedures   Medications Ordered in the ED  magnesium  sulfate IVPB 2 g 50 mL (2 g Intravenous New Bag/Given 10/01/23 0602)  methylPREDNISolone  sodium succinate (SOLU-MEDROL ) 125 mg/2 mL injection 125 mg (125 mg Intravenous Given 10/01/23 0559)  ipratropium-albuterol  (DUONEB) 0.5-2.5 (3) MG/3ML nebulizer solution 3 mL (3 mLs Nebulization Given 10/01/23 0603)   Medical Decision Making Amount and/or Complexity of Data Reviewed Labs: ordered. Radiology: ordered.  Risk Prescription drug management.   63 year old male presents for evaluation.  Please see HPI for further details.  On exam patient afebrile and nontachycardic.  Lung sounds have wheezing throughout, no hypoxia.  Abdomen soft and compressible.  Neurological examination at baseline.  No edema to bilateral lower extremities.  Patient with longstanding history of COPD.  Will treat with DuoNeb, Solu-Medrol , mag.  Will assess with chest x-ray, CBC, BMP, BNP.  CBC with leukocytosis to 11, no anemia.  Metabolic panel unremarkable.  Chest x-ray remarkable, no effusions.  Shows findings consistent with emphysema.  At the end of my shift, patient requires ambulation prior to discharge.  Requires reassessment.  Will sign patient out to oncoming provider [place] PA-C.  Plan management discussed.   Final diagnoses:  COPD exacerbation [facility])    ED Discharge Orders     None          Kristin Peyer 10/01/23 [id number]    Eve Hinders, MD 10/01/23 [phone]

## 2023-10-01 NOTE — ED Provider Notes (Signed)
 Signout from Parke Boll, PA-C at shift change. Briefly, patient presents for shortness of breath.  Longstanding history of COPD, CHF, CAD.  Woke up earlier this morning shortness of breath.  Smokes cigarettes half a pack a day.  He is not on oxygen  at home.  Is without any chest pain, nausea, vomiting.  Also admits noncompliance with his diuretic.  He presents with wheezing.  Overall at this point most concerning for COPD exacerbation.  He does have a mild leukocytosis 11.  BMP is without any significant findings.  Chest x-ray without acute cardiopulmonary disease.  EKG is sinus rhythm.  At time of signout BNP is still pending.  He has received 1 DuoNeb, Solu-Medrol  and mag IV.  Will ambulate.    6:34 AM Reassessment performed. Patient appears comfortable.  Still has some very mild wheezing.  Patient reports marked improvement of symptoms.  Was able to ambulate without difficulty maintaining sats above 93%.  Patient states he is ready go home.  His lab work is overall reassuring.  Patient would like to hold off on additional steroids as he just came off a prescription.  Will send in prednisone  taper and azithromycin  to his pharmacy, should he need them.  Patient is agreeable to plan and will follow-up with his primary care doctor.   Most current vital signs reviewed and are as follows: BP (!) 173/97   Pulse 78   Temp 97.9 F (36.6 C)   Resp (!) 22   Ht 6' (1.829 m)   Wt 79.5 kg   SpO2 100%   BMI 23.77 kg/m     Patient will be discharged home. The patient has been appropriately medically screened and/or stabilized in the ED. I have low suspicion for any other emergent medical condition which would require further screening, evaluation or treatment in the ED or require inpatient management. At time of discharge the patient is hemodynamically stable and in no acute distress. I have discussed work-up results and diagnosis with patient and answered all questions. Patient is agreeable with discharge  plan. We discussed strict return precautions for returning to the emergency department and they verbalized understanding.       Felicie Horning, PA-C 10/01/23 0720    Eve Hinders, MD 10/01/23 2300

## 2023-10-03 ENCOUNTER — Other Ambulatory Visit: Payer: Self-pay

## 2023-10-03 ENCOUNTER — Other Ambulatory Visit (HOSPITAL_COMMUNITY): Payer: Self-pay

## 2023-10-03 ENCOUNTER — Telehealth: Payer: Self-pay | Admitting: *Deleted

## 2023-10-03 ENCOUNTER — Other Ambulatory Visit: Payer: Self-pay | Admitting: Student

## 2023-10-03 MED ORDER — ALBUTEROL SULFATE HFA 108 (90 BASE) MCG/ACT IN AERS
2.0000 | INHALATION_SPRAY | RESPIRATORY_TRACT | 2 refills | Status: DC | PRN
  Filled 2023-10-03: qty 6.7, 17d supply, fill #0
  Filled 2023-10-24: qty 6.7, 17d supply, fill #1

## 2023-10-03 NOTE — Telephone Encounter (Signed)
 Medication sent to pharmacy

## 2023-10-03 NOTE — Telephone Encounter (Signed)
 Will send to Jada, E. CMA.   Copied from CRM [id number]. Topic: Clinical - Prescription Issue >> Oct 03, 2023 12:52 PM Tisa Forester wrote: Reason for CRM: Amanda Jungling calling from  [employer] on half of [place]   faxed over to information to [place] on 10/02/23 checking on the status of the budesonide  formoterol       regarding cost savings , if fax was recieved please have provider to complete and fax over  call back number [id number] reference number [id number]

## 2023-10-03 NOTE — Telephone Encounter (Signed)
 Form to be signed and completed has been placed in the red team box.

## 2023-10-04 ENCOUNTER — Other Ambulatory Visit: Payer: Self-pay | Admitting: Student

## 2023-10-04 DIAGNOSIS — F411 Generalized anxiety disorder: Secondary | ICD-10-CM | POA: Diagnosis not present

## 2023-10-05 ENCOUNTER — Other Ambulatory Visit (HOSPITAL_COMMUNITY): Payer: Self-pay

## 2023-10-05 DIAGNOSIS — F411 Generalized anxiety disorder: Secondary | ICD-10-CM | POA: Diagnosis not present

## 2023-10-06 DIAGNOSIS — F411 Generalized anxiety disorder: Secondary | ICD-10-CM | POA: Diagnosis not present

## 2023-10-07 ENCOUNTER — Other Ambulatory Visit (HOSPITAL_COMMUNITY): Payer: Self-pay

## 2023-10-07 ENCOUNTER — Other Ambulatory Visit: Payer: Self-pay | Admitting: Student

## 2023-10-10 ENCOUNTER — Other Ambulatory Visit (HOSPITAL_COMMUNITY): Payer: Self-pay

## 2023-10-10 ENCOUNTER — Other Ambulatory Visit: Payer: Self-pay

## 2023-10-10 DIAGNOSIS — F411 Generalized anxiety disorder: Secondary | ICD-10-CM | POA: Diagnosis not present

## 2023-10-10 MED ORDER — NICOTINE 14 MG/24HR TD PT24
14.0000 mg | MEDICATED_PATCH | TRANSDERMAL | 1 refills | Status: DC
  Filled 2023-10-10: qty 28, 28d supply, fill #0
  Filled 2023-11-25: qty 28, 28d supply, fill #1

## 2023-10-10 MED ORDER — LORATADINE 10 MG PO TABS
10.0000 mg | ORAL_TABLET | Freq: Every day | ORAL | 0 refills | Status: DC
  Filled 2023-10-10 – 2023-11-10 (×2): qty 100, 100d supply, fill #0

## 2023-10-10 MED ORDER — ATORVASTATIN CALCIUM 40 MG PO TABS
40.0000 mg | ORAL_TABLET | Freq: Every day | ORAL | 2 refills | Status: DC
  Filled 2023-10-10: qty 30, 30d supply, fill #0
  Filled 2023-11-07 (×2): qty 30, 30d supply, fill #1
  Filled 2023-11-25 – 2023-12-16 (×2): qty 30, 30d supply, fill #2

## 2023-10-10 NOTE — Telephone Encounter (Signed)
 Medication sent to pharmacy

## 2023-10-11 ENCOUNTER — Other Ambulatory Visit: Payer: Self-pay | Admitting: Student

## 2023-10-11 ENCOUNTER — Other Ambulatory Visit (HOSPITAL_COMMUNITY): Payer: Self-pay

## 2023-10-11 DIAGNOSIS — F411 Generalized anxiety disorder: Secondary | ICD-10-CM | POA: Diagnosis not present

## 2023-10-11 MED ORDER — TRELEGY ELLIPTA 100-62.5-25 MCG/ACT IN AEPB
1.0000 | INHALATION_SPRAY | Freq: Every day | RESPIRATORY_TRACT | 0 refills | Status: DC
  Filled 2023-10-11 – 2023-10-19 (×2): qty 60, 30d supply, fill #0

## 2023-10-13 ENCOUNTER — Other Ambulatory Visit: Payer: Self-pay | Admitting: Student

## 2023-10-13 ENCOUNTER — Other Ambulatory Visit (HOSPITAL_COMMUNITY): Payer: Self-pay

## 2023-10-14 ENCOUNTER — Other Ambulatory Visit (HOSPITAL_COMMUNITY): Payer: Self-pay

## 2023-10-14 DIAGNOSIS — F411 Generalized anxiety disorder: Secondary | ICD-10-CM | POA: Diagnosis not present

## 2023-10-17 ENCOUNTER — Ambulatory Visit (HOSPITAL_COMMUNITY)
Admission: RE | Admit: 2023-10-17 | Discharge: 2023-10-17 | Disposition: A | Discharge status: Home / Self Care | Source: Ambulatory Visit | Attending: Physician Assistant | Admitting: Physician Assistant

## 2023-10-17 ENCOUNTER — Other Ambulatory Visit (HOSPITAL_COMMUNITY): Payer: Self-pay

## 2023-10-17 VITALS — BP 160/104 | HR 103 | Ht 72.0 in | Wt 177.2 lb

## 2023-10-17 DIAGNOSIS — I2510 Atherosclerotic heart disease of native coronary artery without angina pectoris: Secondary | ICD-10-CM

## 2023-10-17 DIAGNOSIS — Z9861 Coronary angioplasty status: Secondary | ICD-10-CM | POA: Diagnosis not present

## 2023-10-17 DIAGNOSIS — I480 Paroxysmal atrial fibrillation: Secondary | ICD-10-CM | POA: Diagnosis not present

## 2023-10-17 DIAGNOSIS — I10 Essential (primary) hypertension: Secondary | ICD-10-CM

## 2023-10-17 DIAGNOSIS — I4891 Unspecified atrial fibrillation: Secondary | ICD-10-CM | POA: Diagnosis not present

## 2023-10-17 MED ORDER — DILTIAZEM HCL ER COATED BEADS 120 MG PO CP24
120.0000 mg | ORAL_CAPSULE | Freq: Every day | ORAL | 1 refills | Status: DC
  Filled 2023-10-17: qty 30, 30d supply, fill #0
  Filled 2023-11-10: qty 30, 30d supply, fill #1

## 2023-10-17 MED FILL — Metoprolol Tartrate Tab 25 MG: ORAL | 30 days supply | Qty: 60 | Fill #0 | Status: AC

## 2023-10-17 NOTE — Progress Notes (Signed)
 Primary Care Physician: Heddy Barren, DO Primary Cardiologist: None Electrophysiologist: None  Referring Physician: ED   Francisco [name] is a 63 y.o. male with a history of COPD, CAD, HTN, CVA, polysubstance abuse, atrial fibrillation who presents for follow up in the [facility].  The patient was initially diagnosed with atrial fibrillation 07/2023 in the setting of pneumonia and respiratory failure. Patient reports he was unaware of his afib but was not feeling well with his other medical issues. He was started on diltiazem  and metoprolol . He left AMA but did spontaneously convert. Patient is on Eliquis  for stroke prevention.    Patient returns for follow up for atrial fibrillation. He wore a Zio monitor which showed 0% afib burden. He remains in SR today. He has had issues with SOB and wheezing over the past few weeks and has been to the ED twice. He is currently on a prednisone  taper.   Today, he  denies symptoms of palpitations, chest pain, orthopnea, PND, lower extremity edema, dizziness, presyncope, syncope, snoring, daytime somnolence, bleeding, or neurologic sequela. The patient is tolerating medications without difficulties and is otherwise without complaint today.    Atrial Fibrillation Risk Factors:  he does not have symptoms or diagnosis of sleep apnea. he does not have a history of rheumatic fever.   Atrial Fibrillation Management history:  Previous antiarrhythmic drugs: none Previous cardioversions: none Previous ablations: none Anticoagulation history: Eliquis   ROS- All systems are reviewed and negative except as per the HPI above.  Past Medical History:  Diagnosis Date   CHF (congestive heart failure) (HCC)    COPD (chronic obstructive pulmonary disease) (HCC)    COPD with acute exacerbation (HCC) 10/21/2021   Coronary artery disease    a. s/p prior PCI.   Hepatitis-C    Hypertension    IV drug abuse (HCC)    a. previously heroin,  now methamphetamine (04/2019).   Medically noncompliant    MI (myocardial infarction) (HCC)    Tobacco abuse     Current Outpatient Medications  Medication Sig Dispense Refill   albuterol  (PROVENTIL ) (2.5 MG/3ML) 0.083% nebulizer solution Use 1 vial (2.5 mg total) by nebulization every 6 (six) hours as needed for wheezing or shortness of breath. 300 mL 2   albuterol  (VENTOLIN  HFA) 108 (90 Base) MCG/ACT inhaler Inhale 2 puffs into the lungs every 4 (four) hours as needed for wheezing or shortness of breath. 6.7 g 2   apixaban  (ELIQUIS ) 5 MG TABS tablet Take 1 tablet (5 mg total) by mouth 2 (two) times daily. 60 tablet 2   atorvastatin  (LIPITOR ) 40 MG tablet Take 1 tablet (40 mg total) by mouth daily. 30 tablet 2   diltiazem  (CARDIZEM  CD) 120 MG 24 hr capsule Take 1 capsule (120 mg total) by mouth daily. 30 capsule 1   fluticasone  (FLONASE ) 50 MCG/ACT nasal spray Place 1 spray into both nostrils daily. 16 g 0   Fluticasone -Umeclidin-Vilant (TRELEGY ELLIPTA ) 100-62.5-25 MCG/ACT AEPB Inhale 1 puff into the lungs daily. 60 each 0   loratadine  (CLARITIN ) 10 MG tablet Take 1 tablet (10 mg total) by mouth daily. 100 tablet 0   metoprolol  tartrate (LOPRESSOR ) 25 MG tablet Take 1 tablet (25 mg total) by mouth 2 (two) times daily. 60 tablet 1   nicotine  (NICODERM CQ  - DOSED IN MG/24 HOURS) 14 mg/24hr patch Place 1 patch (14 mg total) onto the skin daily. 28 patch 1   nitroGLYCERIN  (NITROSTAT ) 0.4 MG SL tablet Place 1 tablet (0.4 mg  total) under the tongue every 5 (five) minutes x 3 doses as needed for chest pain. 25 tablet 2   predniSONE  (DELTASONE ) 10 MG tablet Take 6 tablets (60 mg total) by mouth daily for 2 days, THEN 5 tablets (50 mg total) daily for 2 days, THEN 4 tablets (40 mg total) daily for 2 days, THEN 3 tablets (30 mg total) daily for 2 days, THEN 2 tablets (20 mg total) daily for 2 days, THEN 1 tablet (10 mg total) daily for 2 days. 42 tablet 0   thiamine  (VITAMIN B-1) 100 MG tablet Take 1  tablet (100 mg total) by mouth daily. 30 tablet 1   No current facility-administered medications for this encounter.    Physical Exam: BP (!) 160/104   Pulse (!) 103   Ht 6' (1.829 m)   Wt 80.4 kg   BMI 24.03 kg/m   GEN: Well nourished, well developed in no acute distress NECK: No JVD CARDIAC: Regular rate and rhythm, no murmurs, rubs, gallops RESPIRATORY:  Clear to auscultation without rales, wheezing or rhonchi  ABDOMEN: Soft, non-tender, non-distended EXTREMITIES:  No edema; No deformity    Wt Readings from Last 3 Encounters:  10/17/23 80.4 kg  10/01/23 79.5 kg  09/28/23 79.4 kg     EKG today demonstrates  Sinus tachycardia, PVC Vent. rate 103 BPM PR interval 128 ms QRS duration 76 ms QT/QTcB 340/445 ms   Echo 08/14/23 demonstrated  1. Left ventricular ejection fraction, by estimation, is 60 to 65%. The  left ventricle has normal function. The left ventricle has no regional  wall motion abnormalities. There is mild left ventricular hypertrophy.  Left ventricular diastolic parameters are indeterminate.   2. Right ventricular systolic function is normal. The right ventricular  size is normal. There is normal pulmonary artery systolic pressure. The  estimated right ventricular systolic pressure is 14.6 mmHg.   3. The mitral valve is normal in structure. No evidence of mitral valve  regurgitation. No evidence of mitral stenosis.   4. The aortic valve is tricuspid. There is mild calcification of the  aortic valve. Aortic valve regurgitation is not visualized. Aortic valve  sclerosis is present, with no evidence of aortic valve stenosis.   5. The inferior vena cava is dilated in size with >50% respiratory  variability, suggesting right atrial pressure of 8 mmHg.    CHA2DS2-VASc Score = 4  The patient's score is based upon: CHF History: 0 HTN History: 1 Diabetes History: 0 Stroke History: 2 Vascular Disease History: 1 Age Score: 0 Gender Score: 0        ASSESSMENT AND PLAN: Paroxysmal Atrial Fibrillation (ICD10:  I48.0) The patient's CHA2DS2-VASc score is 4, indicating a 4.8% annual risk of stroke.   Patient appears to be maintaining SR Zio monitor showed no afib, patient triggered episodes associated with SR. Continue Eliquis  5 mg BID Continue diltiazem  120 mg daily Continue Lopressor  25 mg BID If he has recurrence of his afib, could consider AAD or ablation.   Secondary Hypercoagulable State (ICD10:  D68.69) The patient is at significant risk for stroke/thromboembolism based upon his CHA2DS2-VASc Score of 4.  Continue Apixaban  (Eliquis ).   HTN Elevated today, well controlled at his last visit. Suspect related to prednisone  use. Will not make changes today. Continue to monitor.   CAD S/p PCI remotely >10 years ago. No anginal symptoms Will refer to establish care with a primary cardiologist.    Will refer to establish care with a primary cardiologist. AF clinic as  needed.       Daril Kicks PA-C Afib Clinic [facility] [address] [place], [place] [address] [phone]

## 2023-10-18 ENCOUNTER — Other Ambulatory Visit (HOSPITAL_COMMUNITY): Payer: Self-pay

## 2023-10-18 DIAGNOSIS — F411 Generalized anxiety disorder: Secondary | ICD-10-CM | POA: Diagnosis not present

## 2023-10-19 ENCOUNTER — Other Ambulatory Visit (HOSPITAL_COMMUNITY): Payer: Self-pay

## 2023-10-19 ENCOUNTER — Ambulatory Visit: Payer: Self-pay | Admitting: Student

## 2023-10-19 NOTE — Telephone Encounter (Signed)
 FYI Only or Action Required?: FYI only for provider.  Patient is followed in Pulmonology for COPD, last seen on 09/15/2023 by Hunsucker, [name], MD. Called Nurse Triage reporting Shortness of Breath. Symptoms began several days ago.  Symptoms are: unchanged.  Triage Disposition: See HCP Within 4 Hours (Or PCP Triage)  Patient/caregiver understands and will follow disposition?: Yes                         Copied from CRM [id number]. Topic: Clinical - Red Word Triage >> Oct 19, 2023 11:22 AM [name] wrote: [facility] that prompted transfer to Nurse Triage: SHORTNESS OF BREATH AND COUGHING UP MUCUS Reason for Disposition  [1] MILD difficulty breathing (e.g., minimal/no SOB at rest, SOB with walking, pulse <100) AND [2] NEW-onset or WORSE than normal  Answer Assessment - Initial Assessment Questions Patient scheduled for an appointment tomorrow as pt unable to get to office today due to transportation. This RN educated pt on new-worsening symptoms and when to call back/seek emergent care. Pt verbalized understanding and agrees to plan.    RESPIRATORY STATUS: Describe your breathing? (e.g., wheezing, shortness of breath, unable to speak, severe coughing)      Shortness of breath, productive cough (clear) ONSET: When did this breathing problem begin?      Two days ago SEVERITY: How bad is your breathing? (e.g., mild, moderate, severe)    - MILD: No SOB at rest, mild SOB with walking, speaks normally in sentences, can lie down, no retractions, pulse < 100.    - MODERATE: SOB at rest, SOB with minimal exertion and prefers to sit, cannot lie down flat, speaks in phrases, mild retractions, audible wheezing, pulse 100-120.    - SEVERE: Very SOB at rest, speaks in single words, struggling to breathe, sitting hunched forward, retractions, pulse > 120      Mild RECURRENT SYMPTOM: Have you had difficulty breathing before? If Yes, ask: When was the last time? and What  happened that time?      Yes CAUSE: What do you think is causing the breathing problem?      No OTHER SYMPTOMS: Do you have any other symptoms? (e.g., dizziness, runny nose, cough, chest pain, fever)     Denies dizziness, chest pain, fever; a little runny nose O2 SATURATION MONITOR:  Do you use an oxygen  saturation monitor (pulse oximeter) at home? If Yes, ask: What is your reading (oxygen  level) today? What is your usual oxygen  saturation reading? (e.g., 95%)       Does not have one  Protocols used: Breathing Difficulty-A-AH

## 2023-10-20 ENCOUNTER — Telehealth: Payer: Self-pay

## 2023-10-20 ENCOUNTER — Ambulatory Visit (INDEPENDENT_AMBULATORY_CARE_PROVIDER_SITE_OTHER): Admitting: Pulmonary Disease

## 2023-10-20 ENCOUNTER — Encounter: Payer: Self-pay | Admitting: Pulmonary Disease

## 2023-10-20 ENCOUNTER — Other Ambulatory Visit (HOSPITAL_COMMUNITY): Payer: Self-pay

## 2023-10-20 VITALS — BP 148/92 | HR 84 | Ht 72.0 in | Wt 167.2 lb

## 2023-10-20 DIAGNOSIS — F17210 Nicotine dependence, cigarettes, uncomplicated: Secondary | ICD-10-CM

## 2023-10-20 DIAGNOSIS — J441 Chronic obstructive pulmonary disease with (acute) exacerbation: Secondary | ICD-10-CM

## 2023-10-20 DIAGNOSIS — F411 Generalized anxiety disorder: Secondary | ICD-10-CM | POA: Diagnosis not present

## 2023-10-20 MED ORDER — PREDNISONE 20 MG PO TABS
ORAL_TABLET | ORAL | 0 refills | Status: AC
  Filled 2023-10-20: qty 15, 10d supply, fill #0

## 2023-10-20 NOTE — Telephone Encounter (Signed)
 Ohtuvayre  paperwork handed into pharmacy

## 2023-10-20 NOTE — Patient Instructions (Signed)
 Your lungs sound tight, lets try more steroids, medium doses for a longer period of time  Hopefully we get the Dupixent approved for you in a couple weeks  Call us  and remind us , the pharmacist also placed a reminder in the chart to get back to you in a couple weeks  Continue Trelegy, all inhalers  Paperwork today for a new medication, nebulizer called Ohtuvayre  -if this is approved the reasonable cost this will add to the Trelegy it does not replace anything it is in addition  I am glad you are looking into the stents, the heart more  Return to clinic in 3 months or sooner as needed with Dr. Annella

## 2023-10-20 NOTE — Progress Notes (Signed)
 @Patient  ID: Francisco [name], male    DOB: September 30, 1960, 63 y.o.   MRN: [id number]  Chief Complaint  Patient presents with   Follow-up    Pt states SOB. Pt states it gets worse upon exertion.     Referring provider: Heddy Barren, DO  HPI:   63 y.o. man with very severe COPD whom are seen for evaluation of the same as well as dyspnea on exertion.  Multiple cardiology notes reviewed.  Multiple telephone encounters from our office reviewed.  Multiple ED notes in the interim reviewed.  Here for an acute visit.  Although really just continuation of chronic issues.  Recently given steroids again.  Improved short-term.  We discussed getting more.  He is wheezy and tight on exam.  Placed on Dupixent approval.  Insurance is insisting him to be on inhalers for 3 months.  This will occur middle of this month.  I am hopeful this will help.  Cardiologist having him evaluate his CAD.  I think is a good idea.  Although he certainly is wheezy, likely does have pulmonary component to his symptoms.  HPI initial visit: Patient longstanding smoker.  Working in 2024.  Unfortunately in the late winter/spring 2025 developed worsening symptoms.  Worsening shortness of breath.  Chest congestion.  Recurrent visits to the ED.  Often treated for exacerbations of underlying presumed COPD.  Improved reliably with steroids antibiotics and nebulizer treatments.  So severe 07/2023 led to intubation.  He was on the ventilator for a few days.  Subsequently improved.  Was seen by pulmonary Dr. Cole in clinic 08/2023.  PFTs reviewed.  This reveals very severe obstruction, air trapping, severely reduced DLCO.  Placed on Trelegy.  Since being on Trelegy has been out of the hospital for over a month now.  This seems to be an improvement.  However day-to-day symptoms seem unchanged.  We discussed his mild eosinophilia as possible reason for recent exacerbations, multiple ED visits.  We discussed being aggressive as possible to see to  prevent this.  We discussed biologic therapy and he is eager to move forward with this.  He denies history of diagnosis of asthma.  He is still smoking.  Reviewed chest x-ray 1 view 05/2023 reveals clear lungs, mild hyperinflation.  Reviewed chest x-ray 2 view x-ray 25 reveals hyperinflation of the PA and the lateral clear lungs.  Reveal chest x-ray leading to prolonged hospitalization 08/09/2023 with clear lungs bilaterally.  Questionaires / Pulmonary Flowsheets:   ACT:      No data to display          MMRC:     No data to display          Epworth:      No data to display          Tests:   FENO:  No results found for: NITRICOXIDE  PFT:     No data to display          WALK:      No data to display          Imaging: Personally reviewed and as per EMR and discussion in this note DG Chest 2 View Result Date: 10/01/2023 CLINICAL DATA:  Shortness of breath. EXAM: CHEST - 2 VIEW COMPARISON:  Portable chest 09/28/2023 FINDINGS: The heart size and mediastinal contours are within normal limits. Both lungs are slightly emphysematous but clear. The bones are mildly demineralized. There is mild thoracic kyphosis and mild anterior wedging of the T7-9 vertebrae.  This appears chronic, and was seen on PA and lateral chest 07/20/2023. IMPRESSION: 1. No evidence of acute chest disease. Emphysema. 2. 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 Electronically Signed   By: Francis Quam M.D.   On: 10/01/2023 06:07   DG Chest 1 View Result Date: 09/28/2023 CLINICAL DATA:  Cough for 2 days. EXAM: CHEST  1 VIEW COMPARISON:  Aug 30, 2023. FINDINGS: The heart size and mediastinal contours are within normal limits. Both lungs are clear. The visualized skeletal structures are unremarkable. IMPRESSION: No active disease. Electronically Signed   By: Lynwood Landy Raddle M.D.   On: 09/28/2023 12:52   LONG TERM MONITOR (3-14 DAYS) Result Date:  09/26/2023 Patch Wear Time:  13 days and 16 hours HR 57 - 194, average 92 bpm. 30 nonsustained SVT (longest 14 beats) No atrial fibrillation detected. Rare supraventricular ectopy. Rare ventricular ectopy. No sustained arrhythmias. Symptom trigger episodes correspond to sinus rhythm, sinus tachycardia [facility]   Lab Results: Personally reviewed CBC    Component Value Date/Time   WBC 11.0 (H) 10/01/2023 0543   RBC 4.56 10/01/2023 0543   HGB 14.6 10/01/2023 0543   HGB 17.5 05/18/2021 1518   HCT 44.2 10/01/2023 0543   HCT 51.9 (H) 05/18/2021 1518   PLT 252 10/01/2023 0543   PLT 316 05/18/2021 1518   MCV 96.9 10/01/2023 0543   MCV 93 05/18/2021 1518   MCH 32.0 10/01/2023 0543   MCHC 33.0 10/01/2023 0543   RDW 14.1 10/01/2023 0543   RDW 12.1 05/18/2021 1518   LYMPHSABS 2.8 10/01/2023 0543   LYMPHSABS 1.7 05/18/2021 1518   MONOABS 1.0 10/01/2023 0543   EOSABS 0.2 10/01/2023 0543   EOSABS 0.0 05/18/2021 1518   BASOSABS 0.1 10/01/2023 0543   BASOSABS 0.0 05/18/2021 1518    BMET    Component Value Date/Time   NA 142 10/01/2023 0543   NA 143 08/25/2022 1407   K 3.7 10/01/2023 0543   CL 103 10/01/2023 0543   CO2 30 10/01/2023 0543   GLUCOSE 131 (H) 10/01/2023 0543   BUN 8 10/01/2023 0543   BUN 16 08/25/2022 1407   CREATININE 0.94 10/01/2023 0543   CALCIUM  9.3 10/01/2023 0543   GFRNONAA >60 10/01/2023 0543   GFRAA >60 01/11/2020 0422    BNP    Component Value Date/Time   BNP 83.5 10/01/2023 0544    ProBNP No results found for: PROBNP  Specialty Problems       Pulmonary Problems   COPD (chronic obstructive pulmonary disease) (HCC)   CAP (community acquired pneumonia)   Lung nodules   COPD exacerbation (HCC)    Allergies  Allergen Reactions   Ivp Dye [Iodinated Contrast Media] Other (See Comments)    Seizures    Tramadol Other (See Comments)    Seizures    Doxycycline  Other (See Comments)    Burning sensation to his  hands.    Immunization History  Administered Date(s) Administered   Influenza,inj,Quad PF,6+ Mos 06/17/2018   Pneumococcal Polysaccharide-23 06/17/2018    Past Medical History:  Diagnosis Date   CHF (congestive heart failure) (HCC)    COPD (chronic obstructive pulmonary disease) (HCC)    COPD with acute exacerbation (HCC) 10/21/2021   Coronary artery disease    a. s/p prior PCI.   Hepatitis-C    Hypertension    IV drug abuse (HCC)    a. 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   Medically noncompliant  MI (myocardial infarction) (HCC)    Tobacco abuse     Tobacco History: Social History   Tobacco Use  Smoking Status Every Day   Current packs/day: 0.50   Types: Cigarettes   Passive exposure: Current  Smokeless Tobacco Never  Tobacco Comments   Half a pack a day.   Ready to quit: Not Answered Counseling given: Not Answered Tobacco comments: Half a pack a day.    Outpatient Encounter Medications as of 10/20/2023  Medication Sig   predniSONE  (DELTASONE ) 20 MG tablet Take 2 tablets (40 mg total) by mouth daily with breakfast for 5 days, THEN 1 tablet (20 mg total) daily with breakfast for 5 days.   albuterol  (PROVENTIL ) (2.5 MG/3ML) 0.083% nebulizer solution Use 1 vial (2.5 mg total) by nebulization every 6 (six) hours as needed for wheezing or shortness of breath.   albuterol  (VENTOLIN  HFA) 108 (90 Base) MCG/ACT inhaler Inhale 2 puffs into the lungs every 4 (four) hours as needed for wheezing or shortness of breath.   apixaban  (ELIQUIS ) 5 MG TABS tablet Take 1 tablet (5 mg total) by mouth 2 (two) times daily.   atorvastatin  (LIPITOR ) 40 MG tablet Take 1 tablet (40 mg total) by mouth daily.   diltiazem  (CARDIZEM  CD) 120 MG 24 hr capsule Take 1 capsule (120 mg total) by mouth daily.   fluticasone  (FLONASE ) 50 MCG/ACT nasal spray Place 1 spray into both nostrils daily.   Fluticasone -Umeclidin-Vilant (TRELEGY ELLIPTA ) 100-62.5-25 MCG/ACT AEPB Inhale 1 puff into the  lungs daily.   loratadine  (CLARITIN ) 10 MG tablet Take 1 tablet (10 mg total) by mouth daily.   metoprolol  tartrate (LOPRESSOR ) 25 MG tablet Take 1 tablet (25 mg total) by mouth 2 (two) times daily.   nicotine  (NICODERM CQ  - DOSED IN MG/24 HOURS) 14 mg/24hr patch Place 1 patch (14 mg total) onto the skin daily.   nitroGLYCERIN  (NITROSTAT ) 0.4 MG SL tablet Place 1 tablet (0.4 mg total) under the tongue every 5 (five) minutes x 3 doses as needed for chest pain.   thiamine  (VITAMIN B-1) 100 MG tablet Take 1 tablet (100 mg total) by mouth daily.   No facility-administered encounter medications on file as of 10/20/2023.     Review of Systems  Review of Systems  N/a Physical Exam  BP (!) 148/92 (BP Location: Left Arm, Patient Position: Sitting, Cuff Size: Large)   Pulse 84   Ht 6' (1.829 m)   Wt 167 lb 3.2 oz (75.8 kg)   SpO2 94%   BMI 22.68 kg/m   Wt Readings from Last 5 Encounters:  10/20/23 167 lb 3.2 oz (75.8 kg)  10/17/23 177 lb 3.2 oz (80.4 kg)  10/01/23 175 lb 4.3 oz (79.5 kg)  09/28/23 175 lb (79.4 kg)  09/15/23 175 lb (79.4 kg)    BMI Readings from Last 5 Encounters:  10/20/23 22.68 kg/m  10/17/23 24.03 kg/m  10/01/23 23.77 kg/m  09/28/23 23.73 kg/m  09/15/23 23.73 kg/m     Physical Exam General: Sitting in chair, no acute distress Eyes: EOMI, no icterus Neck: Supple, no JVP Pulmonary: Clear, distant, normal work of breathing Cardiovascular: Warm, no edema Abdomen: Nondistended MSK: No synovitis, no joint effusion Neuro: Normal gait, no weakness Psych: Normal mood, full affect   Assessment & Plan:   Very severe COPD Gold D with acute exacerbation: FEV1 23% of predicted 08/2023.  With evidence of air trapping, severely restricted DLCO.  Appropriately on Trelegy given frequent exacerbations.  Eosinophil count of 300 2024.  Recommend  starting Dupixent, biologic therapy given frequent exacerbations.  Waiting insurance approval hopefully later this month.  New  paperwork for [employer]  today.  Prednisone  taper today.  Dyspnea on exertion: Likely multifactorial due to significant and severe COPD as well as cardiac etiologies.  Will tend to be as aggressive as possible from a pulmonary standpoint to see if we can improve this.  Unfortunately despite increasing medications, not a lot of improvement.  Encouraged ongoing cardiology follow-up.  I think investigation of CAD, dyspnea as anginal equivalent is wise given lack of significant improvement with escalating COPD therapy.  Return in about 3 months (around 01/20/2024) for f/u Dr. Annella.   Donnice JONELLE Annella, MD 10/20/2023

## 2023-10-21 ENCOUNTER — Telehealth: Payer: Self-pay

## 2023-10-21 NOTE — Telephone Encounter (Signed)
 Received [employer] new start paperwork. Completed form and faxed with clinicals and insurance card copy to [facility]   Phone#: [phone] Fax#: [phone]

## 2023-10-22 ENCOUNTER — Other Ambulatory Visit: Payer: Self-pay

## 2023-10-22 ENCOUNTER — Emergency Department (HOSPITAL_COMMUNITY)
Admission: EM | Admit: 2023-10-22 | Discharge: 2023-10-22 | Disposition: A | Discharge status: Home / Self Care | Attending: Emergency Medicine | Admitting: Emergency Medicine

## 2023-10-22 ENCOUNTER — Emergency Department (HOSPITAL_COMMUNITY)

## 2023-10-22 DIAGNOSIS — R0689 Other abnormalities of breathing: Secondary | ICD-10-CM | POA: Diagnosis not present

## 2023-10-22 DIAGNOSIS — R0602 Shortness of breath: Secondary | ICD-10-CM | POA: Diagnosis not present

## 2023-10-22 DIAGNOSIS — Z7901 Long term (current) use of anticoagulants: Secondary | ICD-10-CM | POA: Insufficient documentation

## 2023-10-22 DIAGNOSIS — Z7951 Long term (current) use of inhaled steroids: Secondary | ICD-10-CM | POA: Insufficient documentation

## 2023-10-22 DIAGNOSIS — I509 Heart failure, unspecified: Secondary | ICD-10-CM | POA: Insufficient documentation

## 2023-10-22 DIAGNOSIS — F411 Generalized anxiety disorder: Secondary | ICD-10-CM | POA: Diagnosis not present

## 2023-10-22 DIAGNOSIS — J441 Chronic obstructive pulmonary disease with (acute) exacerbation: Secondary | ICD-10-CM | POA: Diagnosis not present

## 2023-10-22 DIAGNOSIS — R001 Bradycardia, unspecified: Secondary | ICD-10-CM | POA: Diagnosis not present

## 2023-10-22 DIAGNOSIS — R739 Hyperglycemia, unspecified: Secondary | ICD-10-CM | POA: Diagnosis not present

## 2023-10-22 DIAGNOSIS — I10 Essential (primary) hypertension: Secondary | ICD-10-CM | POA: Diagnosis not present

## 2023-10-22 DIAGNOSIS — F17210 Nicotine dependence, cigarettes, uncomplicated: Secondary | ICD-10-CM | POA: Diagnosis not present

## 2023-10-22 LAB — BASIC METABOLIC PANEL WITH GFR
Anion gap: 9 (ref 5–15)
BUN: 16 mg/dL (ref 8–23)
CO2: 29 mmol/L (ref 22–32)
Calcium: 8.8 mg/dL — ABNORMAL LOW (ref 8.9–10.3)
Chloride: 105 mmol/L (ref 98–111)
Creatinine, Ser: 1.02 mg/dL (ref 0.61–1.24)
GFR, Estimated: >60 mL/min (ref >60)
Glucose, Bld: 128 mg/dL — ABNORMAL HIGH (ref 70–99)
Potassium: 3.9 mmol/L (ref 3.5–5.1)
Sodium: 143 mmol/L (ref 135–145)

## 2023-10-22 LAB — RESP PANEL BY RT-PCR (RSV, FLU A&B, COVID)  RVPGX2
Influenza A by PCR: NEGATIVE
Influenza B by PCR: NEGATIVE
Resp Syncytial Virus by PCR: NEGATIVE
SARS Coronavirus 2 by RT PCR: NEGATIVE

## 2023-10-22 LAB — CBC WITH DIFFERENTIAL/PLATELET
Abs Immature Granulocytes: 0.14 K/uL — ABNORMAL HIGH (ref 0.00–0.07)
Basophils Absolute: 0.1 K/uL (ref 0.0–0.1)
Basophils Relative: 0 %
Eosinophils Absolute: 0 K/uL (ref 0.0–0.5)
Eosinophils Relative: 0 %
HCT: 46 % (ref 39.0–52.0)
Hemoglobin: 15.5 g/dL (ref 13.0–17.0)
Immature Granulocytes: 1 %
Lymphocytes Relative: 21 %
Lymphs Abs: 4 K/uL (ref 0.7–4.0)
MCH: 32.4 pg (ref 26.0–34.0)
MCHC: 33.7 g/dL (ref 30.0–36.0)
MCV: 96 fL (ref 80.0–100.0)
Monocytes Absolute: 1.9 K/uL — ABNORMAL HIGH (ref 0.1–1.0)
Monocytes Relative: 10 %
Neutro Abs: 12.9 K/uL — ABNORMAL HIGH (ref 1.7–7.7)
Neutrophils Relative %: 68 %
Platelets: 299 K/uL (ref 150–400)
RBC: 4.79 MIL/uL (ref 4.22–5.81)
RDW: 14.3 % (ref 11.5–15.5)
WBC: 19.1 K/uL — ABNORMAL HIGH (ref 4.0–10.5)
nRBC: 0 % (ref 0.0–0.2)

## 2023-10-22 LAB — I-STAT VENOUS BLOOD GAS, ED
Acid-Base Excess: 6 mmol/L — ABNORMAL HIGH (ref 0.0–2.0)
Bicarbonate: 32.4 mmol/L — ABNORMAL HIGH (ref 20.0–28.0)
Calcium, Ion: 1.15 mmol/L (ref 1.15–1.40)
HCT: 46 % (ref 39.0–52.0)
Hemoglobin: 15.6 g/dL (ref 13.0–17.0)
O2 Saturation: 79 %
Potassium: 3.7 mmol/L (ref 3.5–5.1)
Sodium: 143 mmol/L (ref 135–145)
TCO2: 34 mmol/L — ABNORMAL HIGH (ref 22–32)
pCO2, Ven: 50.6 mmHg (ref 44–60)
pH, Ven: 7.415 (ref 7.25–7.43)
pO2, Ven: 43 mmHg (ref 32–45)

## 2023-10-22 LAB — BRAIN NATRIURETIC PEPTIDE: B Natriuretic Peptide: 166.9 pg/mL — ABNORMAL HIGH (ref 0.0–100.0)

## 2023-10-22 MED ORDER — IPRATROPIUM-ALBUTEROL 0.5-2.5 (3) MG/3ML IN SOLN
3.0000 mL | Freq: Once | RESPIRATORY_TRACT | Status: AC
  Administered 2023-10-22: 3 mL via RESPIRATORY_TRACT
  Filled 2023-10-22: qty 3

## 2023-10-22 MED ORDER — FUROSEMIDE 10 MG/ML IJ SOLN: 40.0000 mg | Freq: Once | INTRAMUSCULAR | Status: DC

## 2023-10-22 NOTE — Discharge Instructions (Signed)
 Continue taking your steroids and home inhalers, follow-up closely with your pulmonologist and cardiologist.  Please return if you have significant worsening shortness of breath despite treatment.  Recommend that you continue to attempt to quit smoking as it will help with your respiratory issues.

## 2023-10-22 NOTE — ED Notes (Signed)
 This RN reviewed discharge instructions with patient. he verbalized understanding and denied any further questions. PT well appearing upon discharge and reports no pain. Pt ambulated with stable gait to exit. Pt endorses ride home.

## 2023-10-22 NOTE — ED Triage Notes (Signed)
 Pt bib [facility] from home due sob that has been going on since Monday, but worsened last 2 days ago. Pt took albuterol  at home and med did not work. Pt was recently intubated approximately 40mo ago due to pneumonia and copd.   Hx COPD , CHF   170/60 75 hr  91% RA , duo ned 96% 30 RR  Ems provided  125 solumedrol  2g mag   Atrovent  5mg  albuterol     20 GLAC

## 2023-10-22 NOTE — ED Provider Notes (Signed)
 [place] EMERGENCY DEPARTMENT AT [facility] Provider Note   CSN: [id number] Arrival date & time: 10/22/23  1014     Patient presents with: Shortness of Breath   Francisco [name] is a 63 y.o. male with past medical history significant for dilated cardiomyopathy, COPD, current tobacco use, patient reports he smokes around 1/2 pack of cigarettes per day who presents concern for shortness of breath ongoing since Monday worse over the last 1 to 2 days.  Taking home albuterol  and steroids has not helped.  Received 125 mg Solu-Medrol  and 2 g of magnesium  prior to arrival by EMS and does report some significant improvement with these medications.  Denies any chest pain, denies fever, chills.  Recently hospitalized for pneumonia and had to be intubated at that time.    Shortness of Breath      Prior to Admission medications   Medication Sig Start Date End Date Taking? Authorizing Provider  albuterol  (PROVENTIL ) (2.5 MG/3ML) 0.083% nebulizer solution Use 1 vial (2.5 mg total) by nebulization every 6 (six) hours as needed for wheezing or shortness of breath. 06/16/23   Atway, Rayann N, DO  albuterol  (VENTOLIN  HFA) 108 (90 Base) MCG/ACT inhaler Inhale 2 puffs into the lungs every 4 (four) hours as needed for wheezing or shortness of breath. 10/03/23   [name], [name], DO  apixaban  (ELIQUIS ) 5 MG TABS tablet Take 1 tablet (5 mg total) by mouth 2 (two) times daily. 08/15/23   Krishnan, Gokul, MD  atorvastatin  (LIPITOR ) 40 MG tablet Take 1 tablet (40 mg total) by mouth daily. 10/10/23   [name], [name], DO  diltiazem  (CARDIZEM  CD) 120 MG 24 hr capsule Take 1 capsule (120 mg total) by mouth daily. 10/17/23   [name], [name], DO  fluticasone  (FLONASE ) 50 MCG/ACT nasal spray Place 1 spray into both nostrils daily. 09/27/23   [name], [name], DO  Fluticasone -Umeclidin-Vilant (TRELEGY ELLIPTA ) 100-62.5-25 MCG/ACT AEPB Inhale 1 puff into the lungs daily. 10/11/23 11/19/23  [name], [name], DO  loratadine   (CLARITIN ) 10 MG tablet Take 1 tablet (10 mg total) by mouth daily. 10/10/23 10/09/24  [name], [name], DO  metoprolol  tartrate (LOPRESSOR ) 25 MG tablet Take 1 tablet (25 mg total) by mouth 2 (two) times daily. 10/17/23   [name], [name], DO  nicotine  (NICODERM CQ  - DOSED IN MG/24 HOURS) 14 mg/24hr patch Place 1 patch (14 mg total) onto the skin daily. 10/10/23   [name], [name], DO  nitroGLYCERIN  (NITROSTAT ) 0.4 MG SL tablet Place 1 tablet (0.4 mg total) under the tongue every 5 (five) minutes x 3 doses as needed for chest pain. 10/14/22   Nooruddin, Saad, MD  predniSONE  (DELTASONE ) 20 MG tablet Take 2 tablets (40 mg total) by mouth daily with breakfast for 5 days, THEN 1 tablet (20 mg total) daily with breakfast for 5 days. 10/20/23 10/30/23  Hunsucker, [name], MD  thiamine  (VITAMIN B-1) 100 MG tablet Take 1 tablet (100 mg total) by mouth daily. 08/15/23   Krishnan, Gokul, MD    Allergies: Ivp dye [iodinated contrast media], Tramadol, and Doxycycline     Review of Systems  Respiratory:  Positive for shortness of breath.   All other systems reviewed and are negative.   Updated Vital Signs BP (!) 168/80 (BP Location: Left Arm)   Pulse 74   Temp 97.6 F (36.4 C) (Oral)   Resp 15   Ht 6' (1.829 m)   Wt 79.4 kg   SpO2 98%   BMI 23.73 kg/m   Physical Exam Vitals and nursing note  reviewed.  Constitutional:      General: He is not in acute distress.    Appearance: Normal appearance.  HENT:     Head: Normocephalic and atraumatic.  Eyes:     General:        Right eye: No discharge.        Left eye: No discharge.  Cardiovascular:     Rate and Rhythm: Normal rate and regular rhythm.     Heart sounds: No murmur heard.    No friction rub. No gallop.  Pulmonary:     Effort: Pulmonary effort is normal.     Breath sounds: Normal breath sounds.     Comments: Some coarse rhonchi, diffuse inspiratory and expiratory wheezing, increased work of breathing but without overt respiratory distress,  patient appears comfortable, no accessory muscle use, no tripoding. Abdominal:     General: Bowel sounds are normal.     Palpations: Abdomen is soft.  Skin:    General: Skin is warm and dry.     Capillary Refill: Capillary refill takes less than 2 seconds.  Neurological:     Mental Status: He is alert and oriented to person, place, and time.  Psychiatric:        Mood and Affect: Mood normal.        Behavior: Behavior normal.     (all labs ordered are listed, but only abnormal results are displayed) Labs Reviewed  BASIC METABOLIC PANEL WITH GFR - Abnormal; Notable for the following components:      Result Value   Glucose, Bld 128 (*)    Calcium  8.8 (*)    All other components within normal limits  CBC WITH DIFFERENTIAL/PLATELET - Abnormal; Notable for the following components:   WBC 19.1 (*)    Neutro Abs 12.9 (*)    Monocytes Absolute 1.9 (*)    Abs Immature Granulocytes 0.14 (*)    All other components within normal limits  BRAIN NATRIURETIC PEPTIDE - Abnormal; Notable for the following components:   B Natriuretic Peptide 166.9 (*)    All other components within normal limits  I-STAT VENOUS BLOOD GAS, ED - Abnormal; Notable for the following components:   Bicarbonate 32.4 (*)    TCO2 34 (*)    Acid-Base Excess 6.0 (*)    All other components within normal limits  RESP PANEL BY RT-PCR (RSV, FLU A&B, COVID)  [id number]    EKG: EKG Interpretation Date/Time:  Saturday October 22 2023 10:19:20 EDT Ventricular Rate:  75 PR Interval:  174 QRS Duration:  87 QT Interval:  375 QTC Calculation: 419 R Axis:   23  Text Interpretation: Sinus rhythm Baseline wander in lead(s) II III aVF Confirmed by Cottie Cough [phone]) on 10/22/2023 10:20:50 AM  Radiology: [facility] Chest Port 1 View Result Date: 10/22/2023 CLINICAL DATA:  Shortness of breath EXAM: PORTABLE CHEST 1 VIEW COMPARISON:  10/01/2023 FINDINGS: Heart size and mediastinal contours appear normal. No pleural effusion or consolidation.  Increased peripheral septal markings identified within the lower lung zones. Visualized osseous structures are unremarkable. IMPRESSION: Increased peripheral septal markings within the lower lung zones. Findings may reflect mild interstitial edema. Electronically Signed   By: Waddell Calk M.D.   On: 10/22/2023 11:23     Procedures   Medications Ordered in the ED  furosemide  (LASIX ) injection 40 mg (has no administration in time range)  ipratropium-albuterol  (DUONEB) 0.5-2.5 (3) MG/3ML nebulizer solution 3 mL (3 mLs Nebulization Given 10/22/23 1057)  Medical Decision Making Amount and/or Complexity of Data Reviewed Labs: ordered. Radiology: ordered.  Risk Prescription drug management.   This patient is a 63 y.o. male  who presents to the ED for concern of shob.   Differential diagnoses prior to evaluation: The emergent differential diagnosis includes, but is not limited to,  asthma exacerbation, COPD exacerbation, acute upper respiratory infection, acute bronchitis, chronic bronchitis, interstitial lung disease, ARDS, PE, pneumonia, atypical ACS, carbon monoxide poisoning, spontaneous pneumothorax, new CHF vs CHF exacerbation, versus other . This is not an exhaustive differential.   Past Medical History / Co-morbidities / Social History: dilated cardiomyopathy, COPD, current tobacco use  Additional history: Chart reviewed. Pertinent results include: Extensively reviewed lab work, imaging from previous emergency department visits, hospitalizations.  Physical Exam: Physical exam performed. The pertinent findings include:  Some coarse rhonchi, diffuse inspiratory and expiratory wheezing, increased work of breathing but without overt respiratory distress, patient appears comfortable, no accessory muscle use, no tripoding.  Hypertensive in ED, BP 168/80. Work of breathing, rhonchi, wheezing all significant improved on re-evaluation.  Lab  Tests/Imaging studies: I personally interpreted labs/imaging and the pertinent results include: CBC no leukocytosis, WBC 19.1, likely secondary to steroid use.  VBG without acidosis.  Some bicarb excess is noted, likely secondary to his COPD and exacerbation.  BMP with very mildly elevated glucose at 128.  His BNP is elevated at 166.9, along with plain film chest x-ray which shows chronic COPD changes along with some interstitial edema.  Offered patient Lasix  but he declined stating that it makes him stay up and pee and he has not been getting any rest, discussed that it may help to improve his respiratory symptoms but he continues to decline. I agree with the radiologist interpretation.  Cardiac monitoring: EKG obtained and interpreted by myself and attending physician which shows: normal sinus rhythm, no acute ST-T changes   Medications: I ordered medication including DuoNeb, with significant improvement of wheezing, work of breathing.  I have reviewed the patients home medicines and have made adjustments as needed.   Disposition: After consideration of the diagnostic results and the patients response to treatment, I feel that patient stable for discharge with acute on chronic COPD exacerbation, still taking steroids at this time .   emergency department workup does not suggest an emergent condition requiring admission or immediate intervention beyond what has been performed at this time. The plan is: as above. The patient is safe for discharge and has been instructed to return immediately for worsening symptoms, change in symptoms or any other concerns.   Final diagnoses:  COPD exacerbation [facility])    ED Discharge Orders     None          Rosan Sherlean VEAR DEVONNA 10/22/23 1237    Cottie Donnice PARAS, MD 10/22/23 1537

## 2023-10-24 ENCOUNTER — Other Ambulatory Visit (HOSPITAL_COMMUNITY): Payer: Self-pay

## 2023-10-24 MED ORDER — ALBUTEROL SULFATE HFA 108 (90 BASE) MCG/ACT IN AERS
2.0000 | INHALATION_SPRAY | RESPIRATORY_TRACT | 1 refills | Status: DC | PRN
  Filled 2023-10-24: qty 18, 17d supply, fill #0
  Filled 2023-11-07: qty 18, 17d supply, fill #1

## 2023-10-25 DIAGNOSIS — F411 Generalized anxiety disorder: Secondary | ICD-10-CM | POA: Diagnosis not present

## 2023-10-26 NOTE — Telephone Encounter (Signed)
 Received fax from [employer] with summary of benefits. Referral form for [employer]  received. Rx will be triaged to [facility].. Once benefits investigation completed, pharmacy will reach out the patient to schedule shipment. If medication is unaffordable, patient will need to express financial hardship to be referred back to [place] [facility] for patient assistance program pre-screening.   Patient ID: [id number] Pharmacy phone: [phone] [place] [facility] Phone#: [phone]

## 2023-10-27 ENCOUNTER — Ambulatory Visit: Payer: Self-pay

## 2023-10-27 DIAGNOSIS — F411 Generalized anxiety disorder: Secondary | ICD-10-CM | POA: Diagnosis not present

## 2023-10-27 NOTE — Telephone Encounter (Signed)
 FYI Only or Action Required?: Action required by provider: referral request and update on patient condition.  Patient was last seen in primary care on 08/16/2023 by Francisco Barren, DO.  Called Nurse Triage reporting Breathing Problem and Insomnia.  Symptoms began chronic.  Interventions attempted: Prescription medications: as prescribed.  Symptoms are: unchanged.  Triage Disposition: See PCP When Office is Open (Within 3 Days)  Patient/caregiver understands and will follow disposition?: No, wishes to speak with PCP   Copied from CRM [id number]. Topic: Clinical - Red Word Triage >> Oct 27, 2023  4:22 PM Zane F wrote: Red Word that prompted transfer to Nurse Triage:   Trouble breathing (worsening); Hard time sleeping Reason for Disposition  [1] MODERATE longstanding difficulty breathing (e.g., speaks in phrases, SOB even at rest, pulse 100-120) AND [2] SAME as normal  Answer Assessment - Initial Assessment Questions 1. RESPIRATORY STATUS: Describe your breathing? (e.g., wheezing, shortness of breath, unable to speak, severe coughing)      Cough 2. ONSET: When did this breathing problem begin?      Chronic-copd 3. PATTERN Does the difficult breathing come and go, or has it been constant since it started?      Intermittent.Breathing comfortably at this time.  4. SEVERITY: How bad is your breathing? (e.g., mild, moderate, severe)      Cough is keeping him up at night.  5. RECURRENT SYMPTOM: Have you had difficulty breathing before? If Yes, ask: When was the last time? and What happened that time?      Yes-awaiting cardiology referral history CHF 6. CARDIAC HISTORY: Do you have any history of heart disease? (e.g., heart attack, angina, bypass surgery, angioplasty)      CHF 7. LUNG HISTORY: Do you have any history of lung disease?  (e.g., pulmonary embolus, asthma, emphysema)     COPD. Reports he has decreased cigarette smoking from 2 packs per day to 1/2 pack per  day and continue to work on quitting/decreasing.  8. CAUSE: What do you think is causing the breathing problem?      Unsure, if cardiac or copd 9. OTHER SYMPTOMS: Do you have any other symptoms? (e.g., chest pain, cough, dizziness, fever, runny nose)     cough   Additional info: 1) Patient called in to check status of cardiology referral, transferred to this writer for reports of breathing difficulty and insomnia.   2) Currently on Trelegy, awaiting dupixent approval.  3) declines need for appointment at this time, requesting follow up call for cardiology referral status.  Protocols used: Breathing Difficulty-A-AH

## 2023-10-28 NOTE — Telephone Encounter (Signed)
 Pt called back regarding card. Referral made on 10/17/23. Is Authorized by insurance and gave pt name phone number and address and transferred to that office.

## 2023-10-29 DIAGNOSIS — Z419 Encounter for procedure for purposes other than remedying health state, unspecified: Secondary | ICD-10-CM | POA: Diagnosis not present

## 2023-10-30 ENCOUNTER — Other Ambulatory Visit: Payer: Self-pay

## 2023-10-30 ENCOUNTER — Emergency Department (HOSPITAL_COMMUNITY)

## 2023-10-30 ENCOUNTER — Observation Stay (HOSPITAL_COMMUNITY)
Admission: EM | Admit: 2023-10-30 | Discharge: 2023-10-30 | Disposition: A | Discharge status: Home / Self Care | Attending: Internal Medicine | Admitting: Internal Medicine

## 2023-10-30 DIAGNOSIS — I10 Essential (primary) hypertension: Secondary | ICD-10-CM | POA: Diagnosis not present

## 2023-10-30 DIAGNOSIS — F17210 Nicotine dependence, cigarettes, uncomplicated: Secondary | ICD-10-CM | POA: Diagnosis not present

## 2023-10-30 DIAGNOSIS — R0602 Shortness of breath: Secondary | ICD-10-CM | POA: Diagnosis not present

## 2023-10-30 DIAGNOSIS — J441 Chronic obstructive pulmonary disease with (acute) exacerbation: Principal | ICD-10-CM | POA: Diagnosis present

## 2023-10-30 DIAGNOSIS — I2510 Atherosclerotic heart disease of native coronary artery without angina pectoris: Secondary | ICD-10-CM | POA: Diagnosis not present

## 2023-10-30 DIAGNOSIS — Z720 Tobacco use: Secondary | ICD-10-CM | POA: Diagnosis present

## 2023-10-30 DIAGNOSIS — J9601 Acute respiratory failure with hypoxia: Principal | ICD-10-CM | POA: Diagnosis present

## 2023-10-30 DIAGNOSIS — R069 Unspecified abnormalities of breathing: Secondary | ICD-10-CM | POA: Diagnosis not present

## 2023-10-30 DIAGNOSIS — R0689 Other abnormalities of breathing: Secondary | ICD-10-CM | POA: Diagnosis not present

## 2023-10-30 DIAGNOSIS — Z79899 Other long term (current) drug therapy: Secondary | ICD-10-CM | POA: Diagnosis not present

## 2023-10-30 DIAGNOSIS — J9621 Acute and chronic respiratory failure with hypoxia: Secondary | ICD-10-CM | POA: Diagnosis present

## 2023-10-30 DIAGNOSIS — I480 Paroxysmal atrial fibrillation: Secondary | ICD-10-CM | POA: Diagnosis present

## 2023-10-30 DIAGNOSIS — I110 Hypertensive heart disease with heart failure: Secondary | ICD-10-CM | POA: Diagnosis not present

## 2023-10-30 DIAGNOSIS — I5040 Unspecified combined systolic (congestive) and diastolic (congestive) heart failure: Secondary | ICD-10-CM | POA: Diagnosis not present

## 2023-10-30 LAB — CBC WITH DIFFERENTIAL/PLATELET
Abs Immature Granulocytes: 0.38 K/uL — ABNORMAL HIGH (ref 0.00–0.07)
Basophils Absolute: 0.1 K/uL (ref 0.0–0.1)
Basophils Relative: 1 %
Eosinophils Absolute: 0.2 K/uL (ref 0.0–0.5)
Eosinophils Relative: 1 %
HCT: 51.5 % (ref 39.0–52.0)
Hemoglobin: 16.9 g/dL (ref 13.0–17.0)
Immature Granulocytes: 2 %
Lymphocytes Relative: 22 %
Lymphs Abs: 3.7 K/uL (ref 0.7–4.0)
MCH: 32.1 pg (ref 26.0–34.0)
MCHC: 32.8 g/dL (ref 30.0–36.0)
MCV: 97.9 fL (ref 80.0–100.0)
Monocytes Absolute: 1.5 K/uL — ABNORMAL HIGH (ref 0.1–1.0)
Monocytes Relative: 9 %
Neutro Abs: 11.1 K/uL — ABNORMAL HIGH (ref 1.7–7.7)
Neutrophils Relative %: 65 %
Platelets: 199 K/uL (ref 150–400)
RBC: 5.26 MIL/uL (ref 4.22–5.81)
RDW: 14.3 % (ref 11.5–15.5)
WBC: 17 K/uL — ABNORMAL HIGH (ref 4.0–10.5)
nRBC: 0 % (ref 0.0–0.2)

## 2023-10-30 LAB — BASIC METABOLIC PANEL WITH GFR
Anion gap: 10 (ref 5–15)
BUN: 22 mg/dL (ref 8–23)
CO2: 31 mmol/L (ref 22–32)
Calcium: 9.4 mg/dL (ref 8.9–10.3)
Chloride: 99 mmol/L (ref 98–111)
Creatinine, Ser: 1.07 mg/dL (ref 0.61–1.24)
GFR, Estimated: >60 mL/min (ref >60)
Glucose, Bld: 129 mg/dL — ABNORMAL HIGH (ref 70–99)
Potassium: 3.8 mmol/L (ref 3.5–5.1)
Sodium: 140 mmol/L (ref 135–145)

## 2023-10-30 MED ORDER — PREDNISONE 20 MG PO TABS: 40.0000 mg | ORAL_TABLET | Freq: Every day | ORAL | Status: DC

## 2023-10-30 MED ORDER — HYDRALAZINE HCL 20 MG/ML IJ SOLN
10.0000 mg | Freq: Once | INTRAMUSCULAR | Status: AC
  Administered 2023-10-30: 10 mg via INTRAVENOUS
  Filled 2023-10-30: qty 1

## 2023-10-30 MED ORDER — ATORVASTATIN CALCIUM 40 MG PO TABS: 40.0000 mg | ORAL_TABLET | Freq: Every day | ORAL | Status: DC

## 2023-10-30 MED ORDER — LORATADINE 10 MG PO TABS: 10.0000 mg | ORAL_TABLET | Freq: Every day | ORAL | Status: DC

## 2023-10-30 MED ORDER — NICOTINE 21 MG/24HR TD PT24: 21.0000 mg | MEDICATED_PATCH | Freq: Every day | TRANSDERMAL | Status: DC

## 2023-10-30 MED ORDER — FLUTICASONE PROPIONATE 50 MCG/ACT NA SUSP
1.0000 | Freq: Every day | NASAL | Status: DC
  Filled 2023-10-30: qty 16

## 2023-10-30 MED ORDER — APIXABAN 5 MG PO TABS: 5.0000 mg | ORAL_TABLET | Freq: Two times a day (BID) | ORAL | Status: DC

## 2023-10-30 MED ORDER — DILTIAZEM HCL ER COATED BEADS 120 MG PO CP24
120.0000 mg | ORAL_CAPSULE | Freq: Every day | ORAL | Status: DC
  Filled 2023-10-30: qty 1

## 2023-10-30 MED ORDER — IPRATROPIUM-ALBUTEROL 0.5-2.5 (3) MG/3ML IN SOLN: 3.0000 mL | RESPIRATORY_TRACT | Status: DC | PRN

## 2023-10-30 MED ORDER — BUDESON-GLYCOPYRROL-FORMOTEROL 160-9-4.8 MCG/ACT IN AERO
2.0000 | INHALATION_SPRAY | Freq: Two times a day (BID) | RESPIRATORY_TRACT | Status: DC
  Filled 2023-10-30: qty 5.9

## 2023-10-30 MED ORDER — METOPROLOL TARTRATE 25 MG PO TABS: 25.0000 mg | ORAL_TABLET | Freq: Two times a day (BID) | ORAL | Status: DC

## 2023-10-30 NOTE — Discharge Summary (Signed)
 Name: Francisco [name] MRN: [id number] DOB: 05-15-1960 63 y.o. PCP: Heddy Barren, DO  Date of Admission: 10/30/2023  2:54 AM Date of Discharge: 10/30/2023  Attending Physician: Dr. Mliss Pouch  Patient left AMA prior to reevaluation with primary day team and attending.  Discharge Diagnosis: Principal Problem:   Acute hypoxic respiratory failure (HCC) Active Problems:   Tobacco abuse   Hypertension   Combined systolic and diastolic congestive heart failure (HCC)   COPD exacerbation (HCC)   Paroxysmal atrial fibrillation (HCC)   Discharge Medications: Current Outpatient Medications  Medication Instructions   albuterol  (PROVENTIL ) (2.5 MG/3ML) 0.083% nebulizer solution Use 1 vial (2.5 mg total) by nebulization every 6 (six) hours as needed for wheezing or shortness of breath.   albuterol  (VENTOLIN  HFA) 108 (90 Base) MCG/ACT inhaler 2 puffs, Inhalation, Every 4 hours PRN   albuterol  (VENTOLIN  HFA) 108 (90 Base) MCG/ACT inhaler Inhale 2 puffs into the lungs every 4 (four) hours as needed for wheezing or shortness of breath.   atorvastatin  (LIPITOR ) 40 mg, Oral, Daily   diltiazem  (CARDIZEM  CD) 120 mg, Oral, Daily   Eliquis  5 mg, Oral, 2 times daily   fluticasone  (FLONASE ) 50 MCG/ACT nasal spray 1 spray, Each Nare, Daily   Fluticasone -Umeclidin-Vilant (TRELEGY ELLIPTA ) 100-62.5-25 MCG/ACT AEPB 1 puff, Inhalation, Daily   loratadine  (CLARITIN ) 10 mg, Oral, Daily   metoprolol  tartrate (LOPRESSOR ) 25 mg, Oral, 2 times daily   nicotine  (NICODERM CQ  - DOSED IN MG/24 HOURS) 14 mg, Transdermal, Every 24 hours   nitroGLYCERIN  (NITROSTAT ) 0.4 mg, Sublingual, Every 5 min x3 PRN   predniSONE  (DELTASONE ) 20 MG tablet Take 2 tablets (40 mg total) by mouth daily with breakfast for 5 days, THEN 1 tablet (20 mg total) daily with breakfast for 5 days.   thiamine  (VITAMIN B1) 100 mg, Oral, Daily     Disposition and follow-up:   Francisco [name] was discharged from [facility] in  [place] condition.  At the hospital follow up visit please address:  1.  Follow-up: Will need to continue following up with pulmonology to start a medication like Dupixent.  Currently has follow-up on 01/23/2024.  Would also recommend that he follows up with the [facility] in the next 1-2 weeks for reevaluation.  Follow-up Appointments:  Follow-up Information     Schedule an appointment as soon as possible for a visit  with Connect with your PCP/Specialist as discussed.   Contact information: [email or web address] Call our physician referral line at [phone].                Hospital Course: Francisco [name] is a 63 y.o. person living with a history of COPD, CHF, CAD, and HTN who presented with shortness of breath and admitted for acute hypoxic respiratory failure 2/2 COPD exacerbation.   Acute hypoxic respiratory failure COPD Exacerbation Patient was admitted for COPD exacerbation causing acute hypoxic respiratory failure with requirements of 2 L of supplemental oxygen  through nasal cannula despite multiple rounds of nebulizer treatments, IV steroids, magnesium , and has had multiple recent antibiotic courses and steroid courses without full resolution of his symptoms.  He has severe COPD Gold group D with frequent exacerbations despite triple therapy.  His pulmonologist is starting him on Dupixent pending insurance authorization.  Our plan was to continue to treat his COPD exacerbation and wean him to room air with nebulizer treatments, continuing triple therapy, and possibly having pulmonology see him while he is in the hospital.  He did not have any signs of community-acquired pneumonia or  purulent sputum to justify starting azithromycin  or other antibiotics.  He did not appear to be in heart failure exacerbation without any signs of fluid overload on exam or imaging.  He does have atrial fibrillation but was in normal sinus rhythm and did not have any significant tachycardia  while he was here.  He was also hypertensive however again he did not appear to have significant stress on his heart causing acute heart failure.  Unfortunately after our initial evaluation and admission and before we could sign out to our daytime internal medicine team and attending the patient left AMA.  Pertinent Labs, Studies, and Procedures:     Latest Ref Rng & Units 10/30/2023    3:06 AM 10/22/2023   10:45 AM 10/22/2023   10:30 AM  CBC  WBC 4.0 - 10.5 K/uL 17.0   19.1   Hemoglobin 13.0 - 17.0 g/dL 83.0  84.3  84.4   Hematocrit 39.0 - 52.0 % 51.5  46.0  46.0   Platelets 150 - 400 K/uL 199   299        Latest Ref Rng & Units 10/30/2023    3:06 AM 10/22/2023   10:45 AM 10/22/2023   10:30 AM  CMP  Glucose 70 - 99 mg/dL 870   871   BUN 8 - 23 mg/dL 22   16   Creatinine 9.38 - 1.24 mg/dL 8.92   8.97   Sodium 864 - 145 mmol/L 140  143  143   Potassium 3.5 - 5.1 mmol/L 3.8  3.7  3.9   Chloride 98 - 111 mmol/L 99   105   CO2 22 - 32 mmol/L 31   29   Calcium  8.9 - 10.3 mg/dL 9.4   8.8     DG Chest Portable 1 View Result Date: 10/30/2023 CLINICAL DATA:  Shortness of breath EXAM: PORTABLE CHEST 1 VIEW COMPARISON:  10/22/2023 FINDINGS: Cardiac shadow is within normal limits. The lungs are well aerated bilaterally. No focal infiltrate or effusion is seen. No bony abnormality is noted. IMPRESSION: No active disease. Electronically Signed   By: Oneil Devonshire M.D.   On: 10/30/2023 03:16      Signed: Fairy Pool, DO Internal Medicine Resident, PGY-3 10/30/2023, 8:30 AM   Please contact the on call pager after 5 pm and on weekends at [phone].

## 2023-10-30 NOTE — ED Triage Notes (Addendum)
 Pt BIB  [facility] from home. Pt c/o SOB that worsened tonight. O2 at 94% on RA. Pt reports being compliant with all home meds. Denies CP.    2g Mag, 2 DuoNeb, 125mg  Solu-medrol  given in route

## 2023-10-30 NOTE — ED Provider Notes (Signed)
 [facility] Provider Note   CSN: [id number] Arrival date & time: 10/30/23  0254     History Chief Complaint  Patient presents with   Shortness of Breath    HPI Francisco [name] is a 63 y.o. male presenting for chief complaint shortness of breath.  He is a 63 year old male history of COPD.  States he has had worsening shortness of breath over the last 48 hours.  Attempted conservative management at home with 3 DuoNeb's over the course of the afternoon.  Unfortunately continued to worsen despite these therapies.  Comes in for further care and management via EMS status post 2 DuoNeb's, Solu-Medrol , magnesium  with mild improvement.   [name] recorded medical, surgical, social, medication list and allergies were reviewed in the Snapshot window as part of the initial history.   Review of Systems   Review of Systems  Constitutional:  Negative for chills and fever.  HENT:  Negative for ear pain and sore throat.   Eyes:  Negative for pain and visual disturbance.  Respiratory:  Positive for shortness of breath and wheezing. Negative for cough.   Cardiovascular:  Negative for chest pain and palpitations.  Gastrointestinal:  Negative for abdominal pain and vomiting.  Genitourinary:  Negative for dysuria and hematuria.  Musculoskeletal:  Negative for arthralgias and back pain.  Skin:  Negative for color change and rash.  Neurological:  Negative for seizures and syncope.  All other systems reviewed and are negative.   Physical Exam Updated Vital Signs BP (!) 196/110   Pulse 93   Temp 97.6 F (36.4 C) (Oral)   Resp 16   Ht 6' (1.829 m)   Wt 79.4 kg   SpO2 99%   BMI 23.73 kg/m  Physical Exam Vitals and nursing note reviewed.  Constitutional:      General: He is not in acute distress.    Appearance: He is well-developed.  HENT:     Head: Normocephalic and atraumatic.  Eyes:     Conjunctiva/sclera: Conjunctivae normal.  Cardiovascular:      Rate and Rhythm: Normal rate and regular rhythm.     Heart sounds: No murmur heard. Pulmonary:     Effort: Pulmonary effort is normal. No respiratory distress.     Breath sounds: Decreased breath sounds and wheezing present.  Abdominal:     Palpations: Abdomen is soft.     Tenderness: There is no abdominal tenderness.  Musculoskeletal:        General: No swelling.     Cervical back: Neck supple.  Skin:    General: Skin is warm and dry.     Capillary Refill: Capillary refill takes less than 2 seconds.  Neurological:     Mental Status: He is alert.  Psychiatric:        Mood and Affect: Mood normal.      ED Course/ Medical Decision Making/ A&P    Procedures Procedures   Medications Ordered in ED Medications  hydrALAZINE  (APRESOLINE ) injection 10 mg (has no administration in time range)   Medical Decision Making:   Francisco [name] is a 63 y.o. male with a history of COPD, who presented to the ED today with acute on chronic SOB. They are endorsing worsening of their baseline dyspnea over the past 12 hours. Their baseline is a 0L O2 requirement. At their baseline they are able to get around the neighborhood and they are not able to at this time.   On my initial exam, the pt  was SOB and tachypneic. Audible wheezing and grossly decreased breath sounds appreciated.  They are endorsing increased sputum production.    Reviewed and confirmed nursing documentation for past medical history, family history, social history.    Initial Assessment:   With the patient's presentation of SOB in the above setting, most likely diagnosis is COPD Exacerbation. Other diagnoses were considered including (but not limited to) CAP, PE, ACS, viral infection, PTX. These are considered less likely due to history of present illness and physical exam findings.   This is most consistent with an acute life/limb threatening illness complicated by underlying chronic conditions.  Initial Plan:  Empiric  treatment of patient's symptoms with immediate initiation of inhaled bronchodilators and IV steroids. Given advanced nature of patient's presentation, will proceed with IV magnesium  as a rescue therapy.   Evaluation for ACS with EKG  Evaluation for infectious versus intrathoracic abnormality with chest x-ray  Evaluation for volume overload with BNP  Screening labs including CBC and Metabolic panel to evaluate for infectious or metabolic etiology of disease.  [name] Wells score is LOW and patient does not warrant further objective evaluation for PE based on consistency of presentation of alternative diagnosis.  Objective evaluation as below reviewed   Initial Study Results:   Laboratory  All laboratory results reviewed without evidence of clinically relevant pathology.    EKG EKG was reviewed independently. Rate, rhythm, axis, intervals all examined and without medically relevant abnormality. ST segments without concerns for elevations.    Radiology:  All images reviewed independently. Agree with radiology report at this time.   DG Chest Portable 1 View Result Date: 10/30/2023 CLINICAL DATA:  Shortness of breath EXAM: PORTABLE CHEST 1 VIEW COMPARISON:  10/22/2023 FINDINGS: Cardiac shadow is within normal limits. The lungs are well aerated bilaterally. No focal infiltrate or effusion is seen. No bony abnormality is noted. IMPRESSION: No active disease. Electronically Signed   By: Oneil Devonshire M.D.   On: 10/30/2023 03:16   DG Chest Port 1 View Result Date: 10/22/2023 CLINICAL DATA:  Shortness of breath EXAM: PORTABLE CHEST 1 VIEW COMPARISON:  10/01/2023 FINDINGS: Heart size and mediastinal contours appear normal. No pleural effusion or consolidation. Increased peripheral septal markings identified within the lower lung zones. Visualized osseous structures are unremarkable. IMPRESSION: Increased peripheral septal markings within the lower lung zones. Findings may reflect mild interstitial edema.  Electronically Signed   By: Waddell Calk M.D.   On: 10/22/2023 11:23   DG Chest 2 View Result Date: 10/01/2023 CLINICAL DATA:  Shortness of breath. EXAM: CHEST - 2 VIEW COMPARISON:  Portable chest 09/28/2023 FINDINGS: The heart size and mediastinal contours are within normal limits. Both lungs are slightly emphysematous but clear. The bones are mildly demineralized. There is mild thoracic kyphosis and mild anterior wedging of the T7-9 vertebrae. This appears chronic, and was seen on PA and lateral chest 07/20/2023. IMPRESSION: 1. No evidence of acute chest disease. Emphysema. 2. 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 Electronically Signed   By: Francis Quam M.D.   On: 10/01/2023 06:07      Consults: Case discussed with IM.   Final Assessment and Plan:   After initiation of medical therapies, patient is grossly improved and no longer in acute distress.    Given the advanced nature of the patient's presentation, patient will require advanced care and admission was arranged.      Clinical Impression:  1. COPD exacerbation (HCC)      Data  Unavailable  Clinical Impression:  1. COPD exacerbation (HCC)      Data Unavailable   Final Clinical Impression(s) / ED Diagnoses Final diagnoses:  COPD exacerbation (HCC)    Rx / DC Orders ED Discharge Orders     None         Jerral Meth, MD 10/30/23 0700

## 2023-10-30 NOTE — Hospital Course (Signed)
 Admitted 10/30/2023  Allergies: Ivp dye [iodinated contrast media], Tramadol, and Doxycycline  Pertinent Hx: COPD,   63 y.o. male p/w ***  * ***  Consults: ***. VTE ppx: ***. Diet: ***

## 2023-10-30 NOTE — H&P (Signed)
 Date: 10/30/2023               Patient Name:  Francisco [name] MRN: [id number]  DOB: [birth year] Age / Sex: 63 y.o., male   PCP: Heddy Barren, DO         Medical Service: Internal Medicine Teaching Service         Attending Physician: Dr. Mliss Pouch      First Contact: Sallyanne Primas, DO}    Second Contact: Dr. Fairy Pool, DO          Pager Information: First Contact Pager: [phone]   Second Contact Pager: [phone]   SUBJECTIVE   Chief Complaint: shortness of breath  History of Present Illness: Francisco [name] is a 63 y.o. male with PMH of COPD, CAD, HTN, and CHF who presented the ED with progressive dyspnea and cough. [name] shortness of breath began about two weeks ago.  He was seen in the pulmonology clinic on 10/20/2023 for the same complaints and given prednisone  taper for 10 days.  He has completed a prednisone  taper and z pack in addition to nebulizer treatments and inhalers at home with little improvement. Over the last 48 hours the patient has had progressive worsening of his shortness and has been unable to sleep, so he came to the ED. He has been coughing more and when he coughs mucus comes up which he reports is mostly clear. He cannot say how many times a day he has to use his inhaler, he states he has to use it to do anything. He guesses he uses it 10-15 times per day on a good day and closer to 25 on a bad day. States the Trelegy inhaler has worked very well. He denies sick contacts, fevers or chills. States his abdomen started feeling bloated within the last days. His last bowel movement was yesterday and he has been eating regularly. Denies abdominal pain, changes in defecation, and itching/burning with urination. He denies changes in vision, trouble swallowing, new onset back pain, numbness/tingling in the fingers or toes. Last time he took his medicines was yesterday.   He has been was discharged from the ED on 7/05 and 6/14 for COPD exacerbations. He saw Dr Cole in  08/2023 and was officially diagnosed with COPD (based on PFTs) and placed on Trelegy.   He test negative for Flu A/B, RSV, and SARS COVID on 7/5.   Patient currently smokes half pack/day. He smoked 2 packs/day for about 40 years. He doesn't drink. 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 Patient lives with a roommate. Patient stated he needs to be in [place], [place] on Tuesday.   ED Course: Labs significant for  - BUN 22, Cr 1.07 - WBC 17 (was 19.1 on 7/5, likely due to recent prednisone ), neutrophils 11.1 Imaging  - CXR: Lungs bilaterally well aerated, cardiac silhouette normal. No focal infiltrate or effusion seen.  Received  - hydralazine  10 mg IV  Past Medical History Systolic and diastolic CHF, primary HTN, COPD exacerbation, CAD, chronic hepatitis C, paroxysmal atrial fibrillation, IVDU, major depressive disorder  Meds:  Patient reported he couldn't name them all but that the ones in his chart are the ones he takes. Current medication list supported by pharmacy fill history. - albuterol  2.5 mg nebulizer q6hrs prn - albuterol  108 prn - apixaban  PO 5 mg 2x/daily - atorvastatin  PO 40 mg daily - trelegy daily  - diltiazem  PO 120 mg daily - fluticasone  50 mcg nasal spray daily  - loratadine  PO 10 mg daily  -  metoprolol  tartrate 25 mg Po 2x/daily - nitroglycerin  .4 mg PO prn - prednisone  PO 80 mg for 5 d, THEN 20 mg for 5 d - thiamine  PO 100 mg daily   Past Surgical History Past Surgical History:  Procedure Laterality Date   BACK SURGERY     NECK SURGERY     RIGHT/LEFT HEART CATH AND CORONARY ANGIOGRAPHY N/A 04/30/2019   Procedure: RIGHT/LEFT HEART CATH AND CORONARY ANGIOGRAPHY;  Surgeon: Anner Alm ORN, MD;  [facility] CV LAB;  Service: Cardiovascular;  Laterality: N/A;     Social:  Lives With: roommate in house Occupation: retired Level of Function: performs daily activities independently  PCP:  Education officer, community, [name], DO  Substances: -Tobacco: 1/2p/d -Alcohol:  no -Recreational Drug: IV meth  Family History:  Family History  Problem Relation Age of Onset   Rheum arthritis Mother    Heart disease Paternal Grandfather      Allergies: Allergies as of 10/30/2023 - Review Complete 10/30/2023  Allergen Reaction Noted   Ivp dye [iodinated contrast media] Other (See Comments) 06/12/2018   Tramadol Other (See Comments) 06/12/2018   Doxycycline  Other (See Comments) 02/01/2023    Review of Systems: A complete ROS was negative except as per HPI.   OBJECTIVE:   Physical Exam: Blood pressure 126/71, pulse 96, temperature 97.6 F (36.4 C), temperature source Oral, resp. rate (!) 21, height 6' (1.829 m), weight 79.4 kg, SpO2 99%.  On 2 L supplemental O2 through nasal cannula Constitutional: Tired appearing male lying in ED bed, in no acute distress Cardiovascular: regular rate and rhythm, no JVD Pulmonary/Chest: normal work of breathing on oxygen , bilateral wheezing Abdominal: generalized distention, not tender to palpation MSK: BL UE radial pulses 2+, BL LE dorsalis pedis pulses 2+, no pitting edema  Neurological: alert & oriented x 3, no focal deficits Skin: warm and dry Psych: normal mentation   Labs: CBC    Component Value Date/Time   WBC 17.0 (H) 10/30/2023 0306   RBC 5.26 10/30/2023 0306   HGB 16.9 10/30/2023 0306   HGB 17.5 05/18/2021 1518   HCT 51.5 10/30/2023 0306   HCT 51.9 (H) 05/18/2021 1518   PLT 199 10/30/2023 0306   PLT 316 05/18/2021 1518   MCV 97.9 10/30/2023 0306   MCV 93 05/18/2021 1518   MCH 32.1 10/30/2023 0306   MCHC 32.8 10/30/2023 0306   RDW 14.3 10/30/2023 0306   RDW 12.1 05/18/2021 1518   LYMPHSABS 3.7 10/30/2023 0306   LYMPHSABS 1.7 05/18/2021 1518   MONOABS 1.5 (H) 10/30/2023 0306   EOSABS 0.2 10/30/2023 0306   EOSABS 0.0 05/18/2021 1518   BASOSABS 0.1 10/30/2023 0306   BASOSABS 0.0 05/18/2021 1518     CMP     Component Value Date/Time   NA 140 10/30/2023 0306   NA 143 08/25/2022 1407   K 3.8  10/30/2023 0306   CL 99 10/30/2023 0306   CO2 31 10/30/2023 0306   GLUCOSE 129 (H) 10/30/2023 0306   BUN 22 10/30/2023 0306   BUN 16 08/25/2022 1407   CREATININE 1.07 10/30/2023 0306   CALCIUM  9.4 10/30/2023 0306   PROT 6.5 09/28/2023 1220   ALBUMIN  3.2 (L) 09/28/2023 1220   AST 39 09/28/2023 1220   ALT 83 (H) 09/28/2023 1220   ALKPHOS 72 09/28/2023 1220   BILITOT 0.5 09/28/2023 1220   GFRNONAA >60 10/30/2023 0306   GFRAA >60 01/11/2020 0422    Imaging:  DG Chest Portable 1 View Result Date: 10/30/2023 CLINICAL DATA:  Shortness of breath EXAM: PORTABLE CHEST 1 VIEW COMPARISON:  10/22/2023 FINDINGS: Cardiac shadow is within normal limits. The lungs are well aerated bilaterally. No focal infiltrate or effusion is seen. No bony abnormality is noted. IMPRESSION: No active disease. Electronically Signed   By: Oneil Devonshire M.D.   On: 10/30/2023 03:16     EKG: personally reviewed my interpretation is normal sinus, right atrial enlargement. Minimal T wave depression in lateral leads.   ASSESSMENT & PLAN:   Assessment & Plan by Problem: Principal Problem:   Acute hypoxic respiratory failure (HCC) Active Problems:   Tobacco abuse   Hypertension   Combined systolic and diastolic congestive heart failure (HCC)   COPD exacerbation (HCC)   Paroxysmal atrial fibrillation (HCC)   Mercer Peifer is a 63 y.o. person living with a history of COPD, CHF, CAD, and HTN who presented with shortness of breath and admitted for acute hypoxic respiratory failure 2/2 COPD exacerbation on hospital day 0  Acute hypoxic respiratory failure COPD Exacerbation Patient has been unable to have relief from shortness of breath for the last couple of weeks. He saw pulmonology (Dr. Annella) on 7/3 who planned to start Dupixent pending approval due to recurrent ED visits and worsening SOB. EKG showing normal sinus and patient denies chest pain. Patient is on anticoagulation home therapy and does not have signs of  DVT. Patient does not have signs of fluid overload. Patient breathing comfortably although still on supplemental oxygen  and after nebulizer treatments in setting of COPD exacerbation.  Also still has significant wheezing on exam.  He does continue to smoke tobacco products which I think is a significant reason for his recurrent exacerbations however we were unable to discuss tobacco cessation in depth during admission. - Continue DuoNebs every 4 hours as needed - Continue triple therapy - Hold off on azithromycin  without purulent sputum or signs of community-acquired pneumonia - Wean to room air as tolerated with O2 sat goal above 88 - Plan to ambulate with O2 - Consider pulmonology evaluation inpatient if unable to wean off oxygen , would also get a VBG if unable to wean off oxygen  this morning  Hypertension Patient is significantly hypertensive here and home medications include diltiazem  120 mg daily and metoprolol  25 mg twice daily.  I do not think his hypertension is contributing significantly to his shortness of breath and do not think that he is in a heart failure exacerbation. - Continue home medications  Paroxysmal atrial fibrillation Normal sinus rhythm here.  Home medications include calcium  channel blocker, beta-blocker, and DOAC.  No RVR. - Continue diltiazem  120 mg daily, metoprolol  tartrate 25 mg twice daily, and Eliquis  5 mg twice daily  Best practice: Diet: Normal VTE: DOAC AC Code: Full  Dispo: Admit patient to Observation with expected length of stay less than 2 midnights.  Signed: Jolaine Pac, DO Internal Medicine Resident  10/30/2023, 8:29 AM  Please contact IM Residency On-Call Pager at: [phone] or [phone].

## 2023-10-30 NOTE — ED Notes (Signed)
 Pt left AMA. AMA form signed in chart. Pt made aware of reasons to stay for care-pt clearly showing signs of SOB. Pt agitated and stating, we did this to him on purpose. Pt ambulatory and left through the front of the ED.

## 2023-10-30 NOTE — ED Notes (Signed)
 Pt complaining of increasing shortness of breath, stating he cannot breath. O2 100, RN notified

## 2023-10-30 NOTE — Discharge Summary (Shared)
 Name: Francisco [name] MRN: [id number] DOB: 09/19/1960 63 y.o. PCP: Heddy Barren, DO  Date of Admission: 10/30/2023  2:54 AM Date of Discharge: *** Attending Physician: {[email or web address]}  Discharge Diagnosis: 1. Principal Problem:   Acute hypoxic respiratory failure (HCC) Active Problems:   Tobacco abuse   Hypertension   Combined systolic and diastolic congestive heart failure (HCC)   COPD exacerbation (HCC)   Paroxysmal atrial fibrillation (HCC)  ***  Discharge Medications: Allergies as of 10/30/2023       Reactions   Ivp Dye [iodinated Contrast Media] Other (See Comments)   Seizures   Tramadol Other (See Comments)   Seizures   Doxycycline  Other (See Comments)   Burning sensation to his hands.        Medication List     ASK your doctor about these medications    albuterol  (2.5 MG/3ML) 0.083% nebulizer solution Commonly known as: PROVENTIL  Use 1 vial (2.5 mg total) by nebulization every 6 (six) hours as needed for wheezing or shortness of breath.   albuterol  108 (90 Base) MCG/ACT inhaler Commonly known as: VENTOLIN  HFA Inhale 2 puffs into the lungs every 4 (four) hours as needed for wheezing or shortness of breath.   Ventolin  HFA 108 (90 Base) MCG/ACT inhaler Generic drug: albuterol  Inhale 2 puffs into the lungs every 4 (four) hours as needed for wheezing or shortness of breath.   atorvastatin  40 MG tablet Commonly known as: LIPITOR  Take 1 tablet (40 mg total) by mouth daily.   diltiazem  120 MG 24 hr capsule Commonly known as: CARDIZEM  CD Take 1 capsule (120 mg total) by mouth daily.   Eliquis  5 MG Tabs tablet Generic drug: apixaban  Take 1 tablet (5 mg total) by mouth 2 (two) times daily.   fluticasone  50 MCG/ACT nasal spray Commonly known as: FLONASE  Place 1 spray into both nostrils daily.   loratadine  10 MG tablet Commonly known as: Claritin  Take 1 tablet (10 mg total) by mouth daily.   metoprolol  tartrate 25 MG tablet Commonly  known as: LOPRESSOR  Take 1 tablet (25 mg total) by mouth 2 (two) times daily.   nicotine  14 mg/24hr patch Commonly known as: NICODERM CQ  - dosed in mg/24 hours Place 1 patch (14 mg total) onto the skin daily.   nitroGLYCERIN  0.4 MG SL tablet Commonly known as: NITROSTAT  Place 1 tablet (0.4 mg total) under the tongue every 5 (five) minutes x 3 doses as needed for chest pain.   predniSONE  20 MG tablet Commonly known as: DELTASONE  Take 2 tablets (40 mg total) by mouth daily with breakfast for 5 days, THEN 1 tablet (20 mg total) daily with breakfast for 5 days. Start taking on: October 20, 2023   thiamine  100 MG tablet Commonly known as: VITAMIN B1 Take 1 tablet (100 mg total) by mouth daily.   Trelegy Ellipta  100-62.5-25 MCG/ACT Aepb Generic drug: Fluticasone -Umeclidin-Vilant Inhale 1 puff into the lungs daily.        Disposition and follow-up:   Francisco [name] was discharged from [facility] in {DISCHARGE CONDITION:19696} condition.  At the hospital follow up visit please address:  1.  ***  2.  Labs / imaging needed at time of follow-up: ***  3.  Pending labs/ test needing follow-up: ***  Follow-up Appointments:  Follow-up Information     Schedule an appointment as soon as possible for a visit  with Connect with your PCP/Specialist as discussed.   Contact information: [email or web address] Call our physician referral line at [phone].  Hospital Course by problem list: Francisco [name] is a 63 y.o. person living with a history of *** who presented with *** and admitted for ***  now being discharged on hospital day 0 with the following pertinent hospital course:  Admitted 10/30/2023  Allergies: Ivp dye [iodinated contrast media], Tramadol, and Doxycycline  Pertinent Hx: COPD,   63 y.o. male p/w ***  * ***  Consults: ***. VTE ppx: ***. Diet: ***    Stable chronic medical  conditions: *** *** ***   Discharge Exam:   BP 138/68   Pulse 87   Temp 97.6 F (36.4 C) (Oral)   Resp (!) 22   Ht 6' (1.829 m)   Wt 79.4 kg   SpO2 97%   BMI 23.73 kg/m  Discharge exam: ***  Pertinent Labs, Studies, and Procedures:     Latest Ref Rng & Units 10/30/2023    3:06 AM 10/22/2023   10:45 AM 10/22/2023   10:30 AM  CBC  WBC 4.0 - 10.5 K/uL 17.0   19.1   Hemoglobin 13.0 - 17.0 g/dL 83.0  84.3  84.4   Hematocrit 39.0 - 52.0 % 51.5  46.0  46.0   Platelets 150 - 400 K/uL 199   299        Latest Ref Rng & Units 10/30/2023    3:06 AM 10/22/2023   10:45 AM 10/22/2023   10:30 AM  CMP  Glucose 70 - 99 mg/dL 870   871   BUN 8 - 23 mg/dL 22   16   Creatinine 9.38 - 1.24 mg/dL 8.92   8.97   Sodium 864 - 145 mmol/L 140  143  143   Potassium 3.5 - 5.1 mmol/L 3.8  3.7  3.9   Chloride 98 - 111 mmol/L 99   105   CO2 22 - 32 mmol/L 31   29   Calcium  8.9 - 10.3 mg/dL 9.4   8.8     DG Chest Portable 1 View Result Date: 10/30/2023 CLINICAL DATA:  Shortness of breath EXAM: PORTABLE CHEST 1 VIEW COMPARISON:  10/22/2023 FINDINGS: Cardiac shadow is within normal limits. The lungs are well aerated bilaterally. No focal infiltrate or effusion is seen. No bony abnormality is noted. IMPRESSION: No active disease. Electronically Signed   By: Oneil Devonshire M.D.   On: 10/30/2023 03:16     Discharge Instructions:   Signed: Benuel Braun, DO 10/30/2023, 4:15 PM

## 2023-10-31 ENCOUNTER — Telehealth: Payer: Self-pay

## 2023-10-31 NOTE — Transitions of Care (Post Inpatient/ED Visit) (Signed)
 10/31/2023  Name: Francisco [name] MRN: [id number] DOB: 1961/03/17  Today's TOC FU Call Status: Today's TOC FU Call Status:: Successful TOC FU Call Completed TOC FU Call Complete Date: 10/31/23 [name] Name and Date of Birth confirmed.  Transition Care Management Follow-up Telephone Call Date of Discharge: 10/30/23 Discharge Facility: [facility]) Type of Discharge: Inpatient Admission Primary Inpatient Discharge Diagnosis:: COPD Any questions or concerns?: No  Items Reviewed: Did you receive and understand the discharge instructions provided?: Yes Medications obtained,verified, and reconciled?: Yes (Medications Reviewed) Any new allergies since your discharge?: No Dietary orders reviewed?: Yes Do you have support at home?: No  Medications Reviewed Today: Medications Reviewed Today     Reviewed by Emmitt Pan, LPN (Licensed Practical Nurse) on 10/31/23 at 1559  Med List Status: <None>   Medication Order Taking? Sig Documenting Provider Last Dose Status Informant  albuterol  (PROVENTIL ) (2.5 MG/3ML) 0.083% nebulizer solution [id number] Yes Use 1 vial (2.5 mg total) by nebulization every 6 (six) hours as needed for wheezing or shortness of breath. Atway, Rayann N, DO  Active Self, Pharmacy Records  albuterol  (VENTOLIN  HFA) 108 [phone] Base) MCG/ACT inhaler [id number]  Inhale 2 puffs into the lungs every 4 (four) hours as needed for wheezing or shortness of breath.  Patient not taking: Reported on 10/31/2023   [name], [name], DO  Active Self, Pharmacy Records  albuterol  (VENTOLIN  HFA) 108 [phone] Base) MCG/ACT inhaler [id number] Yes Inhale 2 puffs into the lungs every 4 (four) hours as needed for wheezing or shortness of breath.   Active Self, Pharmacy Records  apixaban  (ELIQUIS ) 5 MG TABS tablet [id number] Yes Take 1 tablet (5 mg total) by mouth 2 (two) times daily. Verdene Purchase, MD  Active Self, Pharmacy Records           Med Note LEOBARDO, NICOLE   Sun Oct 30, 2023  6:50 AM) Patient  would not provide time for dose.  atorvastatin  (LIPITOR ) 40 MG tablet [id number] Yes Take 1 tablet (40 mg total) by mouth daily. Heddy Barren, DO  Active Self, Pharmacy Records  diltiazem  (CARDIZEM  CD) 120 MG 24 hr capsule [id number] Yes Take 1 capsule (120 mg total) by mouth daily. Heddy Barren, DO  Active Self, Pharmacy Records  fluticasone  (FLONASE ) 50 MCG/ACT nasal spray [id number] Yes Place 1 spray into both nostrils daily. Heddy Barren, DO  Active Self, Pharmacy Records  Fluticasone -Umeclidin-Vilant (TRELEGY ELLIPTA ) 100-62.5-25 MCG/ACT AEPB [id number] Yes Inhale 1 puff into the lungs daily. Heddy Barren, DO  Active Self, Pharmacy Records  loratadine  (CLARITIN ) 10 MG tablet [id number] Yes Take 1 tablet (10 mg total) by mouth daily. Heddy Barren, DO  Active Self, Pharmacy Records  metoprolol  tartrate (LOPRESSOR ) 25 MG tablet [id number] Yes Take 1 tablet (25 mg total) by mouth 2 (two) times daily. [name], [name], DO  Active Self, Pharmacy Records  nicotine  (NICODERM CQ  - DOSED IN MG/24 HOURS) 14 mg/24hr patch [id number] Yes Place 1 patch (14 mg total) onto the skin daily. Heddy Barren, DO  Active Self, Pharmacy Records  nitroGLYCERIN  (NITROSTAT ) 0.4 MG SL tablet [id number] Yes Place 1 tablet (0.4 mg total) under the tongue every 5 (five) minutes x 3 doses as needed for chest pain. Nooruddin, Saad, MD  Active Self, Pharmacy Records           Med Note Polk, NATHANEL SAILOR   Thu Aug 11, 2023  3:19 PM)    thiamine  (VITAMIN B-1) 100 MG tablet [id number] Yes Take 1 tablet (100 mg total) by mouth daily. Krishnan, Gokul, MD  Active Self, Pharmacy Records            Home Care and Equipment/Supplies: Were Home Health Services Ordered?: NA Any new equipment or medical supplies ordered?: NA  Functional Questionnaire: Do you need assistance with bathing/showering or dressing?: No Do you need assistance with meal preparation?: No Do you need assistance with eating?: No Do you have  difficulty maintaining continence: No Do you need assistance with getting out of bed/getting out of a chair/moving?: No Do you have difficulty managing or taking your medications?: No  Follow up appointments reviewed: PCP Follow-up appointment confirmed?: No (declined) Specialist Hospital Follow-up appointment confirmed?: Yes Date of Specialist follow-up appointment?: 01/26/24 Follow-Up Specialty Provider:: pulmo Do you need transportation to your follow-up appointment?: No Do you understand care options if your condition(s) worsen?: Yes-patient verbalized understanding    SIGNATURE Julian Lemmings, LPN [facility] Nurse Health Advisor Direct Dial [phone]

## 2023-11-01 DIAGNOSIS — F411 Generalized anxiety disorder: Secondary | ICD-10-CM | POA: Diagnosis not present

## 2023-11-04 DIAGNOSIS — F411 Generalized anxiety disorder: Secondary | ICD-10-CM | POA: Diagnosis not present

## 2023-11-05 DIAGNOSIS — F411 Generalized anxiety disorder: Secondary | ICD-10-CM | POA: Diagnosis not present

## 2023-11-07 ENCOUNTER — Other Ambulatory Visit (HOSPITAL_BASED_OUTPATIENT_CLINIC_OR_DEPARTMENT_OTHER): Payer: Self-pay

## 2023-11-07 ENCOUNTER — Other Ambulatory Visit: Payer: Self-pay

## 2023-11-07 ENCOUNTER — Telehealth: Payer: Self-pay

## 2023-11-07 ENCOUNTER — Other Ambulatory Visit (HOSPITAL_COMMUNITY): Payer: Self-pay

## 2023-11-07 NOTE — Telephone Encounter (Signed)
 Submitted an URGENT Prior Authorization request to [facility] Medicaid for DUPIXENT  via CoverMyMeds. Will update once we receive a response.  Key: [id number]

## 2023-11-07 NOTE — Telephone Encounter (Signed)
 Gajera, Devki S, RPH-CPP   09/29/23  1:35 PM Note Called patient to advise that DPX was denied due to not being on ICS/LABA/LAMA for 3 months. Advised that we can retry in mid-July through [facility] and that unfortunately Medicaid is quite strict with their criteria.   Will retain encounter in pharmacy team mailbox to pursue in July 2025   Sherry Pennant, PharmD, MPH, BCPS, CPP Clinical Pharmacist (Rheumatology and Pulmonology)

## 2023-11-07 NOTE — Telephone Encounter (Signed)
 Copied from CRM [id number]. Topic: Clinical - Prescription Issue >> Nov 07, 2023  2:07 PM Russell PARAS wrote: Reason for CRM:   Corin, with [facility] [employer], is contacting clinic regarding the Ohtuvayre  PA. The PA was denied and letter was sent from [facility] on 07/14, along with request for appeal notification plans.   Requesting call back to verify if clinic will be appealing decision and if so, requesting date of when appeal was sent to payor so that they can follow up. If clinic decided not to appeal decision, [facility] will need notification so that they can close the case.   CB#  [phone]

## 2023-11-07 NOTE — Telephone Encounter (Signed)
 Will plan on working on appeal this week for Dupixent 

## 2023-11-07 NOTE — Telephone Encounter (Signed)
 Copied from CRM [id number]. Topic: Clinical - Prescription Issue >> Nov 07, 2023  9:03 AM Russell PARAS wrote: Reason for CRM:   Pt calling to check on status of PA for Dupixent . Reviewed chart and unable to find any updates.   Pt requested call back  #[phone]

## 2023-11-07 NOTE — Telephone Encounter (Signed)
 Received a fax regarding Prior Authorization from [facility] for OHTUVAYRE . Authorization has been DENIED because patient must try at least 2 preferred drugs: Anoro, Atrovent  HFA, Combivent, Incruse, ipratropium nebulizer, ipratropium-albuterol , roflumilast, Spiriva , Stiolto  Patient has tried ipratropium nebulizer, Spiriva , Duonebs  Submitted an URGENT appeal to [facility] [place] Medicaid for OHTUVAYRE .  Reference # [id number] Phone: [phone] Fax: [phone]  VPP notified fax  Sherry Pennant, PharmD, MPH, BCPS, CPP Clinical Pharmacist (Rheumatology and Pulmonology)

## 2023-11-08 DIAGNOSIS — F411 Generalized anxiety disorder: Secondary | ICD-10-CM | POA: Diagnosis not present

## 2023-11-08 MED ORDER — DUPIXENT 300 MG/2ML ~~LOC~~ SOAJ
300.0000 mg | SUBCUTANEOUS | 0 refills | Status: DC
Start: 2023-11-08 — End: 2023-11-14
  Filled 2023-11-09: qty 4, 28d supply, fill #0

## 2023-11-08 NOTE — Telephone Encounter (Signed)
 Received notification from [facility] Medicaid regarding a prior authorization for DUPIXENT . Authorization has been APPROVED from 11/07/2023 to 05/05/2024. Approval letter sent to scan center.  Per test claim, copay for 28 days supply is $4  Patient can fill through [facility]: [phone]   Authorization # [id number]  Rx sent to [facility] for onboarding. Patient aware that Francisco [name] will call to help coordinate shipment to clinic before appointment  Dupixent  new start scheduled for 11/14/2023  Sherry Pennant, PharmD, MPH, BCPS, CPP Clinical Pharmacist (Rheumatology and Pulmonology)

## 2023-11-08 NOTE — Telephone Encounter (Signed)
 Received fax from [facility] regaridng appeal for Ohtuvayre . Denial is UPHELD for same reasoning.    I already discussed with patient. Dupixent  has been approved so we will try Dupixent  and see how well this works for him. Unfortunately next steps to appeal would be a state fair hearing  Sherry Pennant, PharmD, MPH, BCPS, CPP Clinical Pharmacist (Rheumatology and Pulmonology)

## 2023-11-09 ENCOUNTER — Other Ambulatory Visit: Payer: Self-pay

## 2023-11-09 NOTE — Progress Notes (Signed)
 Specialty Pharmacy Initial Fill Coordination Note  Francisco [name] is a 63 y.o. male contacted today regarding initial fill of specialty medication(s) Dupilumab  (Dupixent )   Patient requested Courier to Provider Office   Delivery date: 11/10/23   Verified address: [address] [place], [place] [address]   Medication will be filled on 11/09/23.   Patient is aware of $4 copayment, however he did not appear comfortable providing card information during this phone call. Advised that we will bill this fill to AR account and pt can provide credit card details at later date if he would prefer. Informed him that pharmacy phone number would be included in [employer]. Pt verbalized understanding.

## 2023-11-10 ENCOUNTER — Other Ambulatory Visit (HOSPITAL_COMMUNITY): Payer: Self-pay

## 2023-11-11 ENCOUNTER — Other Ambulatory Visit: Payer: Self-pay

## 2023-11-11 NOTE — Progress Notes (Signed)
 HPI Patient presents today to [facility] to see pharmacy team for Dupixent  new start.  Past medical history includes COPD, Cardiomyopathy, CAD, HTN, CHF, Pulmonary HTN, and Atrial fibrillation.  Number of hospitalizations in past year: 3 Number of COPD/asthma exacerbations in past year: 17  Respiratory Medications Current regimen: Trelegy Ellipta  100-62.5-25 mcg/act. Inhale 1 puff into the lungs daily.  Tried in past: none Patient reports no known adherence challenges  OBJECTIVE Allergies  Allergen Reactions   Ivp Dye [Iodinated Contrast Media] Other (See Comments)    Seizures    Tramadol Other (See Comments)    Seizures    Doxycycline  Other (See Comments)    Burning sensation to his hands.    Outpatient Encounter Medications as of 11/14/2023  Medication Sig Note   albuterol  (PROVENTIL ) (2.5 MG/3ML) 0.083% nebulizer solution Use 1 vial (2.5 mg total) by nebulization every 6 (six) hours as needed for wheezing or shortness of breath.    albuterol  (VENTOLIN  HFA) 108 (90 Base) MCG/ACT inhaler Inhale 2 puffs into the lungs every 4 (four) hours as needed for wheezing or shortness of breath. (Patient not taking: Reported on 10/31/2023)    albuterol  (VENTOLIN  HFA) 108 (90 Base) MCG/ACT inhaler Inhale 2 puffs into the lungs every 4 (four) hours as needed for wheezing or shortness of breath.    apixaban  (ELIQUIS ) 5 MG TABS tablet Take 1 tablet (5 mg total) by mouth 2 (two) times daily. 10/30/2023: Patient would not provide time for dose.   atorvastatin  (LIPITOR ) 40 MG tablet Take 1 tablet (40 mg total) by mouth daily.    diltiazem  (CARDIZEM  CD) 120 MG 24 hr capsule Take 1 capsule (120 mg total) by mouth daily.    Dupilumab  (DUPIXENT ) 300 MG/2ML SOAJ Inject 300 mg into the skin every 14 (fourteen) days. Courier to pulm: [address], [place] [address]. Appt on 11/14/23    fluticasone  (FLONASE ) 50 MCG/ACT nasal spray Place 1 spray into both nostrils daily.     Fluticasone -Umeclidin-Vilant (TRELEGY ELLIPTA ) 100-62.5-25 MCG/ACT AEPB Inhale 1 puff into the lungs daily.    loratadine  (CLARITIN ) 10 MG tablet Take 1 tablet (10 mg total) by mouth daily.    metoprolol  tartrate (LOPRESSOR ) 25 MG tablet Take 1 tablet (25 mg total) by mouth 2 (two) times daily.    nicotine  (NICODERM CQ  - DOSED IN MG/24 HOURS) 14 mg/24hr patch Place 1 patch (14 mg total) onto the skin daily.    nitroGLYCERIN  (NITROSTAT ) 0.4 MG SL tablet Place 1 tablet (0.4 mg total) under the tongue every 5 (five) minutes x 3 doses as needed for chest pain.    thiamine  (VITAMIN B-1) 100 MG tablet Take 1 tablet (100 mg total) by mouth daily.    No facility-administered encounter medications on file as of 11/14/2023.     Immunization History  Administered Date(s) Administered   Influenza,inj,Quad PF,6+ Mos 06/17/2018   Pneumococcal Polysaccharide-23 06/17/2018     PFTs     No data to display           Eosinophils Most recent blood eosinophil count was 200  taken on 10/30/2023.    Assessment   Biologics training for dupilumab  (Dupixent )  Goals of therapy: Mechanism: human monoclonal IgG4 antibody that inhibits interleukin-4 and interleukin-13 cytokine-induced responses, including release of proinflammatory cytokines, chemokines, and IgE Reviewed that Dupixent  is add-on medication and patient must continue maintenance inhaler regimen. Response to therapy: may take 4 months to determine efficacy. Discussed that patients generally feel improvement sooner  than 4 months.  Side effects: injection site reaction (6-18%), antibody development (5-16%), ophthalmic conjunctivitis (2-16%), transient blood eosinophilia (1-2%)  Dose: 300mg  every 14 days (for COPD)  Administration/Storage:  Reviewed administration sites of thigh or abdomen (at least 2-3 inches away from abdomen). Reviewed the upper arm is only appropriate if caregiver is administering injection  Do not shake pen/syringe as this  could lead to product foaming or precipitation. Do not use if solution is discolored or contains particulate matter or if window on prefilled pen is yellow (indicates pen has been used).  Reviewed storage of medication in refrigerator. Reviewed that Dupixent  can be stored at room temperature in unopened carton for up to 14 days.  Access: Approval of Dupixent  through: insurance  Patient self-administered Dupixent  300mg /64ml left lower abdomen using sample Dupixent  300mg /46mL autoinjector pen NDC: 9975-4084-99 Lot: [id number] Expiration: 12/17/2025  Patient monitored for 30 minutes for adverse reaction.  Patient tolerated injection well. Noted a little pain on injection.  Injection site noted. Patient denies itchiness and irritation at injection., No swelling or redness noted., and Reviewed injection site reaction management with patient verbally and printed information for review in AVS  Medication Reconciliation  A drug regimen assessment was performed, including review of allergies, interactions, disease-state management, dosing and immunization history. Medications were reviewed with the patient, including name, instructions, indication, goals of therapy, potential side effects, importance of adherence, and safe use.  Drug interaction(s): none   PLAN Continue Dupixent  300 mg every 14 days.  Next dose is due 11/28/2023 and every 14 days thereafter. Rx sent to: [facility]: [phone] .  Patient provided with pharmacy phone number and advised to call later this week to schedule shipment to home.  Continue maintenance inhaler regimen of: Trelegy Ellipta  100-62.5-25 mcg/act. Inhale 1 puff into the lungs daily.  All questions encouraged and answered.  Instructed patient to reach out with any further questions or concerns.  Thank you for allowing pharmacy to participate in this patient's care.  This appointment required 45 minutes of patient care (this includes precharting,  chart review, review of results, face-to-face care, etc.).

## 2023-11-14 ENCOUNTER — Other Ambulatory Visit: Payer: Self-pay

## 2023-11-14 ENCOUNTER — Ambulatory Visit: Admitting: Pharmacist

## 2023-11-14 DIAGNOSIS — J449 Chronic obstructive pulmonary disease, unspecified: Secondary | ICD-10-CM | POA: Diagnosis not present

## 2023-11-14 DIAGNOSIS — Z7189 Other specified counseling: Secondary | ICD-10-CM

## 2023-11-14 MED ORDER — DUPIXENT 300 MG/2ML ~~LOC~~ SOAJ
300.0000 mg | SUBCUTANEOUS | 1 refills | Status: DC
  Filled 2023-11-14: qty 12, 84d supply, fill #0
  Filled 2023-11-28: qty 4, 28d supply, fill #0
  Filled 2023-12-28: qty 4, 28d supply, fill #1

## 2023-11-14 NOTE — Patient Instructions (Addendum)
 Your next Dupixent  dose is due on 11/28/2023, 12/12/2023, and every 14 days thereafter  CONTINUE Trelegy Ellipta  100-62.5-25 mcg/act. Inhale 1 puff into the lungs daily.  Your prescription will be shipped from [facility]. Their phone number is [phone]. Please call to schedule shipment and confirm address. They will mail your medication to your home.  You will need to be seen by your provider in 3 to 4 months to assess how Dupixent  is working for you. Please ensure you have a follow-up appointment scheduled in 3 months. Call our clinic if you need to make this appointment.  Stay up to date on all routine vaccines: influenza, pneumonia, COVID19, Shingles  How to manage an injection site reaction: Remember the 5 C's: COUNTER - leave on the counter at least 30 minutes but up to overnight to bring medication to room temperature. This may help prevent stinging COLD - place something cold (like an ice gel pack or cold water bottle) on the injection site just before cleansing with alcohol. This may help reduce pain CLARITIN  - use Claritin  (generic name is loratadine ) for the first two weeks of treatment or the day of, the day before, and the day after injecting. This will help to minimize injection site reactions CORTISONE CREAM - apply if injection site is irritated and itching CALL ME - if injection site reaction is bigger than the size of your fist, looks infected, blisters, or if you develop hives

## 2023-11-15 DIAGNOSIS — F411 Generalized anxiety disorder: Secondary | ICD-10-CM | POA: Diagnosis not present

## 2023-11-16 DIAGNOSIS — F411 Generalized anxiety disorder: Secondary | ICD-10-CM | POA: Diagnosis not present

## 2023-11-17 ENCOUNTER — Other Ambulatory Visit (HOSPITAL_COMMUNITY): Payer: Self-pay

## 2023-11-17 ENCOUNTER — Other Ambulatory Visit: Payer: Self-pay | Admitting: Student

## 2023-11-17 DIAGNOSIS — F411 Generalized anxiety disorder: Secondary | ICD-10-CM | POA: Diagnosis not present

## 2023-11-17 MED ORDER — APIXABAN 5 MG PO TABS
5.0000 mg | ORAL_TABLET | Freq: Two times a day (BID) | ORAL | 2 refills | Status: DC
  Filled 2023-11-17: qty 60, 30d supply, fill #0
  Filled 2023-11-25 – 2023-12-16 (×2): qty 60, 30d supply, fill #1
  Filled 2024-01-17: qty 60, 30d supply, fill #2

## 2023-11-17 MED ORDER — NITROGLYCERIN 0.4 MG SL SUBL
0.4000 mg | SUBLINGUAL_TABLET | SUBLINGUAL | 2 refills | Status: AC | PRN
  Filled 2023-11-17: qty 25, 8d supply, fill #0
  Filled 2023-12-16: qty 25, 8d supply, fill #1
  Filled 2024-02-20: qty 25, 8d supply, fill #2

## 2023-11-17 NOTE — Telephone Encounter (Signed)
 LOV 08/16/23

## 2023-11-17 NOTE — Telephone Encounter (Signed)
 Copied from CRM [id number]. Topic: Clinical - Prescription Issue >> Nov 17, 2023  3:51 PM Diannia H wrote: Reason for CRM: Patient called and he is needing his meds filled asap. The pharmacy sent over a request for  Nitroglycerin  0.4 mg Sublingual Every 5 min x3 PRN Apixaban  5 mg Oral 2 times daily.   He also wants the provider to know that he started his shots Dupilumab  (DUPIXENT ) 300 MG/2ML SOAJ on Monday.

## 2023-11-18 NOTE — Progress Notes (Signed)
 Patient started Dupixent  in office on 11/14/2023. Continue Dupixent  300 mg every 14 days.  Next dose is due 11/28/2023 and every 14 days thereafter. Rx sent to: [facility]: [phone] .    Continue maintenance inhaler regimen of: Trelegy Ellipta  100-62.5-25 mcg/act. Inhale 1 puff into the lungs daily.  Sherry Pennant, PharmD, MPH, BCPS, CPP Clinical Pharmacist (Rheumatology and Pulmonology)

## 2023-11-21 ENCOUNTER — Other Ambulatory Visit: Payer: Self-pay

## 2023-11-21 ENCOUNTER — Other Ambulatory Visit: Payer: Self-pay | Admitting: Student

## 2023-11-21 ENCOUNTER — Ambulatory Visit: Payer: Self-pay | Admitting: Pulmonary Disease

## 2023-11-21 ENCOUNTER — Other Ambulatory Visit (HOSPITAL_COMMUNITY): Payer: Self-pay

## 2023-11-21 ENCOUNTER — Ambulatory Visit

## 2023-11-21 VITALS — BP 133/88 | HR 78 | Temp 97.7°F | Ht 72.0 in | Wt 183.4 lb

## 2023-11-21 DIAGNOSIS — R3911 Hesitancy of micturition: Secondary | ICD-10-CM | POA: Diagnosis not present

## 2023-11-21 DIAGNOSIS — B1920 Unspecified viral hepatitis C without hepatic coma: Secondary | ICD-10-CM | POA: Diagnosis not present

## 2023-11-21 DIAGNOSIS — F17200 Nicotine dependence, unspecified, uncomplicated: Secondary | ICD-10-CM

## 2023-11-21 DIAGNOSIS — Z23 Encounter for immunization: Secondary | ICD-10-CM | POA: Diagnosis not present

## 2023-11-21 DIAGNOSIS — I10 Essential (primary) hypertension: Secondary | ICD-10-CM

## 2023-11-21 DIAGNOSIS — J449 Chronic obstructive pulmonary disease, unspecified: Secondary | ICD-10-CM

## 2023-11-21 DIAGNOSIS — F17210 Nicotine dependence, cigarettes, uncomplicated: Secondary | ICD-10-CM | POA: Diagnosis not present

## 2023-11-21 DIAGNOSIS — F411 Generalized anxiety disorder: Secondary | ICD-10-CM | POA: Diagnosis not present

## 2023-11-21 DIAGNOSIS — I5042 Chronic combined systolic (congestive) and diastolic (congestive) heart failure: Secondary | ICD-10-CM

## 2023-11-21 DIAGNOSIS — F1510 Other stimulant abuse, uncomplicated: Secondary | ICD-10-CM

## 2023-11-21 DIAGNOSIS — J9601 Acute respiratory failure with hypoxia: Secondary | ICD-10-CM

## 2023-11-21 MED ORDER — ALBUTEROL SULFATE HFA 108 (90 BASE) MCG/ACT IN AERS
2.0000 | INHALATION_SPRAY | RESPIRATORY_TRACT | 2 refills | Status: DC | PRN
  Filled 2023-11-21: qty 18, 17d supply, fill #0
  Filled 2023-12-07: qty 18, 17d supply, fill #1
  Filled 2023-12-20: qty 18, 17d supply, fill #2

## 2023-11-21 MED ORDER — TRELEGY ELLIPTA 100-62.5-25 MCG/ACT IN AEPB
1.0000 | INHALATION_SPRAY | Freq: Every day | RESPIRATORY_TRACT | 3 refills | Status: DC
  Filled 2023-11-21: qty 60, 30d supply, fill #0
  Filled 2023-11-25 – 2023-12-20 (×2): qty 60, 30d supply, fill #1
  Filled 2024-02-04: qty 60, 30d supply, fill #2

## 2023-11-21 NOTE — Assessment & Plan Note (Signed)
>>  ASSESSMENT AND PLAN FOR COPD (CHRONIC OBSTRUCTIVE PULMONARY DISEASE) (HCC) WRITTEN ON 11/21/2023  2:48 PM BY BENUEL BRAUN, DO  Mr. Timson has been hospitalized last in April for COPD exacerbation. He has had to return to ED a number of times since then. He states that since leaving the hospital in April, his COPD symptoms such as SOB and wheezing have been bad in that he has to use his inhaler about 10-15 times a day. He states he uses Trelogy, albuterol , and nebulizer. He states that he can't shower or walk even short distances (like to his kitchen) without feeling SOB. In office today O2 Saturation was 89-91%. Patient was not using accessory muscles to breath; wheezing noted on exam; able to talk in full sentences. Patient denied any recent fevers/chills/illness. Patient stated cough is always productive with white sputum. Patient declined walking O2 saturation testing due to ride pick up. Patient mentioned having some chest pain that resolved with nitrates last week- Patient denied any chest pain today. [name] described symptoms are chronic per his report. No acute change today. -Patient instructed to go to ED if any worsening symptoms or concerns; patient stated understanding.  -Instructed patient to call Pulmonology office to be seen sooner than scheduled appointment in October for chronic management of disease. Patient currently on wait list.  -Patient seen in Afib clinic and has first appointment with primary Cardiologist Dr. Elmira on 01/19/2024, but with concern for cardiac cause of dyspnea on exertion such as anginal equivalent, I messaged Dr. Elmira who responded they will try and see him sooner.  -Refill Trelogy and Albuterol  Inhaler -continue Dupixant with Pulmonology (last shot was Monday July 28) -Patient scheduled for CT chest for pulmonary cancer screening with current smoking history -Received pneumococcal vaccine and told to get RSV vaccine at pharmacy  -Instructed to get  Covid-19 vaccine, Shingles vaccine, and influenza vaccine at later date.

## 2023-11-21 NOTE — Patient Instructions (Addendum)
 Today we discussed the following medical conditions and plan:   COPD -having to take your inhalers 15 times a day, your wheezing, and history of chest pain last week, I would encourage you to see your cardiologist sooner. I am going to message your Afib clinic [facility] [place]) to see if we can move up your cardiology appointment  -Speak with your Pulmonologist to be seen sooner regarding your symptoms  -continue using your inhalers/nebulizer. Someone from the clinic will put in for refills of your Trelogy and rescue inhaler.  -I ordered a CT scan for you for lung cancer screening  -you received the pneumococcal vaccine today  -go to your pharmacy for the RSV vaccine -when you feel better, I would encourage you to get your Covid vaccine and Shingles vaccine  Urinary Hesitancy and Frequency  -I got a PSA Lab for you to see if your prostate is causing your symptoms   Hepatitis  -we are testing for Hepatitis B Antibody to see if you've been immunized for hepatitis (received the vaccine) before  -we are testing for Hep C levels  -referral to our Infectious Disease doctor to be seen here in clinic  We look forward to seeing you next time. Please call our clinic at [phone] if you have any questions or concerns. The best time to call is Monday-Friday from 9am-4pm, but there is someone available 24/7. If you need medication refills, please notify your pharmacy one week in advance and they will send us  a request.   Thank you for trusting me with your care. Wishing you the best!   Sallyanne Primas, DO  [facility]

## 2023-11-21 NOTE — Progress Notes (Signed)
 Last Hospitalization  Patient was admitted to [facility] on 08/13/2023 and left AMA on 08/15/2023. He was treated for Acute Hypoxic and Hypercapnic Respiratory failure/COPD with acute exacerbation/CAP + Afib with RVR Treatment for this included:  Intubated in the ICU with successful extubation, prednisone , Trelogy, Rocephin  and Azithromycin , PRN albuterol   Cardizem  infusion with transition to oral Cardizem   + home metoprolol  + Apixaban  5 mg. Wore monitor and no afib documented.  He reports fair compliance with treatment. He reports this condition is improved from hospitalization, but overall still struggling with COPD.   Since leaving the hospital in April, Francisco [name] has returned to ED a number of times. He has to use his inhalers 10-15 times a day, he uses his nebulizer at home, and he has been seen by pulmonology to start dupixant.   ----------------------------------------------------------------------------------------  CC: Patient presented to clinic today to discuss updating vaccines.  HPI:  Francisco [name] is a 63 y.o. male with pertinent past medical history of COPD, CAD, IV methamphetamine use, history of CVA (further medical history stated below) and presents today for discussion of vaccines and chronic SOB/wheezing. Please see problem based assessment and plan for additional details.  Last clinic appointment: 08/16/2023 for Hospital follow up  Past Medical History:  Diagnosis Date   CHF (congestive heart failure) (HCC)    COPD (chronic obstructive pulmonary disease) (HCC)    COPD with acute exacerbation (HCC) 10/21/2021   Coronary artery disease    a. s/p prior PCI.   Hepatitis-C    Hypertension    IV drug abuse (HCC)    a. 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   Medically noncompliant    MI (myocardial infarction) (HCC)    Tobacco abuse     Current Outpatient Medications on File Prior to Visit  Medication Sig Dispense Refill   albuterol   (PROVENTIL ) (2.5 MG/3ML) 0.083% nebulizer solution Use 1 vial (2.5 mg total) by nebulization every 6 (six) hours as needed for wheezing or shortness of breath. 300 mL 2   albuterol  (VENTOLIN  HFA) 108 (90 Base) MCG/ACT inhaler Inhale 2 puffs into the lungs every 4 (four) hours as needed for wheezing or shortness of breath. 18 g 1   apixaban  (ELIQUIS ) 5 MG TABS tablet Take 1 tablet (5 mg total) by mouth 2 (two) times daily. 60 tablet 2   atorvastatin  (LIPITOR ) 40 MG tablet Take 1 tablet (40 mg total) by mouth daily. 30 tablet 2   diltiazem  (CARDIZEM  CD) 120 MG 24 hr capsule Take 1 capsule (120 mg total) by mouth daily. 30 capsule 1   Dupilumab  (DUPIXENT ) 300 MG/2ML SOAJ Inject 300 mg into the skin every 14 (fourteen) days. 12 mL 1   fluticasone  (FLONASE ) 50 MCG/ACT nasal spray Place 1 spray into both nostrils daily. 16 g 0   loratadine  (CLARITIN ) 10 MG tablet Take 1 tablet (10 mg total) by mouth daily. 100 tablet 0   metoprolol  tartrate (LOPRESSOR ) 25 MG tablet Take 1 tablet (25 mg total) by mouth 2 (two) times daily. 60 tablet 1   nicotine  (NICODERM CQ  - DOSED IN MG/24 HOURS) 14 mg/24hr patch Place 1 patch (14 mg total) onto the skin daily. 28 patch 1   nitroGLYCERIN  (NITROSTAT ) 0.4 MG SL tablet Place 1 tablet (0.4 mg total) under the tongue every 5 (five) minutes x 3 doses as needed for chest pain. 25 tablet 2   thiamine  (VITAMIN B-1) 100 MG tablet Take 1 tablet (100 mg total) by mouth daily. 30 tablet 1  No current facility-administered medications on file prior to visit.    Family History  Problem Relation Age of Onset   Rheum arthritis Mother    Heart disease Paternal Grandfather     Social History   Socioeconomic History   Marital status: Divorced    Spouse name: Not on file   Number of children: Not on file   Years of education: Not on file   Highest education level: Not on file  Occupational History   Not on file  Tobacco Use   Smoking status: Every Day    Current packs/day:  0.50    Types: Cigarettes    Passive exposure: Current   Smokeless tobacco: Never   Tobacco comments:    Half a pack a day.  Vaping Use   Vaping status: Never Used  Substance and Sexual Activity   Alcohol use: Never   Drug use: Yes    Types: Methamphetamines    Comment: Last use 10/14 per pt   Sexual activity: Not on file  Other Topics Concern   Not on file  Social History Narrative   Not on file   Social Drivers of Health   Financial Resource Strain: High Risk (08/24/2023)   Received from [facility]   Overall Financial Resource Strain (CARDIA)    Difficulty of Paying Living Expenses: Hard  Food Insecurity: Food Insecurity Present (08/24/2023)   Received from [facility]   Hunger Vital Sign    Within the past 12 months, you worried that your food would run out before you got the money to buy more.: Sometimes true    Within the past 12 months, the food you bought just didn't last and you didn't have money to get more.: Sometimes true  Transportation Needs: Unmet Transportation Needs (08/24/2023)   Received from [facility] - Transportation    In the past 12 months, has lack of transportation kept you from medical appointments or from getting medications?: Yes    Lack of Transportation (Non-Medical): Yes  Physical Activity: Not on file  Stress: Not on file  Social Connections: Unknown (08/14/2023)   Social Connection and Isolation Panel    Frequency of Communication with Friends and Family: Twice a week    Frequency of Social Gatherings with Friends and Family: Not on file    Attends Religious Services: 1 to 4 times per year    Active Member of [employer] or Organizations: No    Attends Banker Meetings: Never    Marital Status: Separated  Intimate Partner Violence: Not At Risk (08/14/2023)   Humiliation, Afraid, Rape, and Kick questionnaire    Fear of Current or Ex-Partner: No    Emotionally Abused: No     Physically Abused: No    Sexually Abused: No    Review of Systems: Review of Systems  Constitutional:  Negative for chills, fever and weight loss.  HENT:  Positive for congestion.   Respiratory:  Positive for cough (clear mucus. every cough, a lot), sputum production, shortness of breath and wheezing. Negative for hemoptysis.   Cardiovascular:  Positive for chest pain (had to take nitro a couple of days last week. no chest pain currently) and orthopnea (has to prob up to sleep). Negative for palpitations and leg swelling.  Gastrointestinal:  Negative for blood in stool, constipation, diarrhea, melena, nausea and vomiting.  Genitourinary:  Negative for dysuria.       Has to get up multiple times  in the night, sometimes hard to get started.  Musculoskeletal:  Positive for back pain (history of 4 back surgeries).  Neurological:  Positive for weakness (legs weaker than he used to be. since COPD) and headaches (2-3 times a week. front and band like). Negative for dizziness and seizures.     Vitals:   11/21/23 1102 11/21/23 1107  BP: (!) 159/80 133/88  Pulse: 78 78  Temp: 97.7 F (36.5 C)   TempSrc: Oral   SpO2: 91%   Weight: 183 lb 6.4 oz (83.2 kg)   Height: 6' (1.829 m)     Physical Exam: Physical Exam Constitutional:      General: He is not in acute distress.    Appearance: Normal appearance. He is not ill-appearing, toxic-appearing or diaphoretic.  Cardiovascular:     Rate and Rhythm: Normal rate and regular rhythm.     Pulses:          Radial pulses are 2+ on the right side and 2+ on the left side.     Heart sounds: Normal heart sounds. No murmur heard.    No friction rub. No gallop.  Pulmonary:     Effort: Pulmonary effort is normal. No tachypnea, bradypnea, accessory muscle usage or respiratory distress.     Breath sounds: Examination of the right-upper field reveals wheezing. Examination of the left-upper field reveals wheezing. Examination of the right-middle field  reveals wheezing. Examination of the left-middle field reveals wheezing. Examination of the right-lower field reveals wheezing. Examination of the left-lower field reveals wheezing. Wheezing present. No rhonchi or rales.  Skin:    General: Skin is warm and dry.  Neurological:     Mental Status: He is alert.      Assessment & Plan:   Patient seen with Dr. Karna  Assessment & Plan Chronic obstructive pulmonary disease, unspecified COPD type [facility]) Smoker Mr. Boswell has been hospitalized last in April for COPD exacerbation. He has had to return to ED a number of times since then. He states that since leaving the hospital in April, his COPD symptoms such as SOB and wheezing have been bad in that he has to use his inhaler about 10-15 times a day. He states he uses Trelogy, albuterol , and nebulizer. He states that he can't shower or walk even short distances (like to his kitchen) without feeling SOB. In office today O2 Saturation was 89-91%. Patient was not using accessory muscles to breath; wheezing noted on exam; able to talk in full sentences. Patient denied any recent fevers/chills/illness. Patient stated cough is always productive with white sputum. Patient declined walking O2 saturation testing due to ride pick up. Patient mentioned having some chest pain that resolved with nitrates last week- Patient denied any chest pain today. [name] described symptoms are chronic per his report. No acute change today. -Patient instructed to go to ED if any worsening symptoms or concerns; patient stated understanding.  -Instructed patient to call Pulmonology office to be seen sooner than scheduled appointment in October for chronic management of disease. Patient currently on wait list.  -Patient seen in Afib clinic and has first appointment with primary Cardiologist Dr. Elmira on 01/19/2024, but with concern for cardiac cause of dyspnea on exertion such as anginal equivalent, I messaged Dr. Elmira who  responded they will try and see him sooner.  -Refill Trelogy and Albuterol  Inhaler -continue Dupixant with Pulmonology (last shot was Monday July 28) -Patient scheduled for CT chest for pulmonary cancer screening with current smoking history -Received pneumococcal  vaccine and told to get RSV vaccine at pharmacy  -Instructed to get Covid-19 vaccine, Shingles vaccine, and influenza vaccine at later date.  Urinary hesitancy Noted Urinary hesitance and increased frequency of urination at night.  -PSA level drawn today  Need for diphtheria, tetanus, acellular pertussis, haemophilus influenzae, and hepatitis B virus vaccine Hepatitis B vaccine history unknown. Patient stated he has received his tetanus vaccine within the past 10 years.  -Hep B Surface Antibody blood test drawn  -Patient stated interest in getting Hep B vaccine if needed Hepatitis C test positive Patient has previous + hepatitis C test Last drawn in January of 2023, HCV Ab >11.0, Hep C Quantitation 5,500,000 -Hep C RNA level drawn today -referral to Dr. Eben with Infectious Disease for treatment of Hep C Methamphetamine use (HCC) Last usage of IV methamphetamine noted yesterday, stated about 1/4 of a gram.  Patient reports getting drugs off the street. Notes uses clean needles through needle exchange program. Patient has Hx hep C. States his meth is tested and comes back clean without Fentyl or other impurities. Hx of opioid pill abuse and hx of IV heroin use.  -counseled on cessation -patient stated desire to quit but not interested in NA/AA. States he wants to quit on his own   Orders Placed This Encounter  Procedures   CT CHEST LUNG CA SCREEN LOW DOSE W/O CM    Standing Status:   Future    Expiration Date:   11/20/2024    Preferred Imaging Location?:   [facility]   Pneumococcal conjugate vaccine 20-valent (Prevnar 20)   PSA   Hepatitis B Surface Antibody   HCV RNA quant     Hollyn Stucky, D.O. [facility]  Internal Medicine, PGY-1 Date 11/21/2023 Time 1:50 PM

## 2023-11-21 NOTE — Telephone Encounter (Signed)
 Medication expired off of med list.

## 2023-11-21 NOTE — Assessment & Plan Note (Addendum)
 [name] has been hospitalized last in April for COPD exacerbation. He has had to return to ED a number of times since then. He states that since leaving the hospital in April, his COPD symptoms such as SOB and wheezing have been bad in that he has to use his inhaler about 10-15 times a day. He states he uses Trelogy, albuterol , and nebulizer. He states that he can't shower or walk even short distances (like to his kitchen) without feeling SOB. In office today O2 Saturation was 89-91%. Patient was not using accessory muscles to breath; wheezing noted on exam; able to talk in full sentences. Patient denied any recent fevers/chills/illness. Patient stated cough is always productive with white sputum. Patient declined walking O2 saturation testing due to ride pick up. Patient mentioned having some chest pain that resolved with nitrates last week- Patient denied any chest pain today. [name] described symptoms are chronic per his report. No acute change today. -Patient instructed to go to ED if any worsening symptoms or concerns; patient stated understanding.  -Instructed patient to call Pulmonology office to be seen sooner than scheduled appointment in October for chronic management of disease. Patient currently on wait list.  -Patient seen in Afib clinic and has first appointment with primary Cardiologist Dr. Elmira on 01/19/2024, but with concern for cardiac cause of dyspnea on exertion such as anginal equivalent, I messaged Dr. Elmira who responded they will try and see him sooner.  -Refill Trelogy and Albuterol  Inhaler -continue Dupixant with Pulmonology (last shot was Monday July 28) -Patient scheduled for CT chest for pulmonary cancer screening with current smoking history -Received pneumococcal vaccine and told to get RSV vaccine at pharmacy  -Instructed to get Covid-19 vaccine, Shingles vaccine, and influenza vaccine at later date.

## 2023-11-21 NOTE — Telephone Encounter (Signed)
 FYI Only or Action Required?: Action required by provider: update on patient condition and patient was told that he should be seen sooner by pulmonary-placed on wait list.  Patient is followed in Pulmonology for COPD, last seen on 10/20/2023 by Hunsucker, [name], MD.  Called Nurse Triage reporting Shortness of Breath.  Symptoms began a week ago.  Interventions attempted: Rescue inhaler, Nebulizer treatments, and Other: Dupixent -started almost a week ago.  Symptoms are: unchanged.  Triage Disposition: See HCP Within 4 Hours (Or PCP Triage)-recommended to Emergency Department as patient states he wouldn't be able to get to into pulmonary office today due to transportation.   Patient/caregiver understands and will follow disposition?: Unsure  Copied from CRM [id number]. Topic: Clinical - Red Word Triage >> Nov 21, 2023 12:05 PM Leila C wrote: Red Word that prompted transfer to Nurse Triage: Patient [phone] states is in the pcp office right now and is having breathing issues, shortness of breath, wheezing, oxygen  level is 89, no dizziness nor pain. Patient states pcp wants patient to be seen as soon as possible with Dr. Annella. Please advise. Reason for Disposition  [1] Longstanding difficulty breathing (e.g., CHF, COPD, emphysema) AND [2] WORSE than normal  Answer Assessment - Initial Assessment Questions 1. RESPIRATORY STATUS: Describe your breathing? (e.g., wheezing, shortness of breath, unable to speak, severe coughing)      Shortness of breath-at rest at times and with any movement, wheezing 2. ONSET: When did this breathing problem begin?      Started last week 3. PATTERN Does the difficult breathing come and go, or has it been constant since it started?      Comes and goes 4. SEVERITY: How bad is your breathing? (e.g., mild, moderate, severe)      moderate 5. RECURRENT SYMPTOM: Have you had difficulty breathing before? If Yes, ask: When was the last time? and  What happened that time?      Yes-last time he went into the hospital about a month ago 6. CARDIAC HISTORY: Do you have any history of heart disease? (e.g., heart attack, angina, bypass surgery, angioplasty)      MI-stents in heart, CHF 7. LUNG HISTORY: Do you have any history of lung disease?  (e.g., pulmonary embolus, asthma, emphysema)     COPD 8. CAUSE: What do you think is causing the breathing problem?       Concerned for COPD 9. OTHER SYMPTOMS: Do you have any other symptoms? (e.g., chest pain, cough, dizziness, fever, runny nose)     coughing 10. O2 SATURATION MONITOR:  Do you use an oxygen  saturation monitor (pulse oximeter) at home? If Yes, ask: What is your reading (oxygen  level) today? What is your usual oxygen  saturation reading? (e.g., 95%)       89% at doctor's office.  12. TRAVEL: Have you traveled out of the country in the last month? (e.g., travel history, exposures)       No  Patient calling while at PCP office-able to speak in full sentences with little to no distress. Patient endorses moderate shortness of breath and pulse oximeter reading of 89% in office. Patient states he doesn't check his oxygen  readings as he doesn't have a pulse oximeter at home. Patient states increased shortness of breath started about a week ago. Patient states inhalers and nebulizer is helping with symptoms. Discussed with patient possible acute appointment options for today but patient states he is unable to get to office due to transportation. Patient is recommended to be seen in  Emergency Department today. Patient endorses that he will try to get a ride. Patient was recommended by PCP that he should be seen sooner than October. Patient was placed on wait list.  Protocols used: Breathing Difficulty-A-AH

## 2023-11-21 NOTE — Assessment & Plan Note (Addendum)
 Last usage of IV methamphetamine noted yesterday, stated about 1/4 of a gram.  Patient reports getting drugs off the street. Notes uses clean needles through needle exchange program. Patient has Hx hep C. States his meth is tested and comes back clean without Fentyl or other impurities. Hx of opioid pill abuse and hx of IV heroin use.  -counseled on cessation -patient stated desire to quit but not interested in NA/AA. States he wants to quit on his own

## 2023-11-22 ENCOUNTER — Other Ambulatory Visit (HOSPITAL_COMMUNITY): Payer: Self-pay

## 2023-11-22 ENCOUNTER — Ambulatory Visit: Payer: Self-pay

## 2023-11-22 LAB — HCV RNA QUANT
HCV log10: 6.544 {Log_IU}/mL
Hepatitis C Quantitation: 3500000 [IU]/mL

## 2023-11-22 LAB — PSA: Prostate Specific Ag, Serum: 1.4 ng/mL (ref 0.0–4.0)

## 2023-11-22 LAB — HEPATITIS B SURFACE ANTIBODY,QUALITATIVE: Hep B Surface Ab, Qual: NONREACTIVE

## 2023-11-22 MED FILL — Albuterol Sulfate Inhal Aero 108 MCG/ACT (90MCG Base Equiv): RESPIRATORY_TRACT | 17 days supply | Qty: 18 | Fill #0 | Status: CN

## 2023-11-22 NOTE — Telephone Encounter (Signed)
 [facility] called me back and stated that the pt was unable to come in today due to transportation but they would have someone schedule the pt for a time he could. It looks like pt has been scheduled for 12-07-2023. NFN

## 2023-11-22 NOTE — Telephone Encounter (Signed)
 I called and spoke to pt. I informed pt that Dr Darlean has availability at 2:45 pm today. Pt states if he can get a ride here then he will come today but he will call me back to verify if he can make it today or not. I have held the slot. Awaiting call back.

## 2023-11-23 ENCOUNTER — Ambulatory Visit: Attending: Cardiology | Admitting: Cardiology

## 2023-11-23 ENCOUNTER — Other Ambulatory Visit (HOSPITAL_COMMUNITY): Payer: Self-pay

## 2023-11-23 ENCOUNTER — Encounter: Payer: Self-pay | Admitting: Internal Medicine

## 2023-11-23 ENCOUNTER — Encounter: Payer: Self-pay | Admitting: Cardiology

## 2023-11-23 VITALS — BP 153/91 | HR 79 | Resp 16 | Ht 72.0 in | Wt 186.1 lb

## 2023-11-23 DIAGNOSIS — I480 Paroxysmal atrial fibrillation: Secondary | ICD-10-CM | POA: Insufficient documentation

## 2023-11-23 DIAGNOSIS — I6522 Occlusion and stenosis of left carotid artery: Secondary | ICD-10-CM | POA: Insufficient documentation

## 2023-11-23 DIAGNOSIS — I10 Essential (primary) hypertension: Secondary | ICD-10-CM | POA: Insufficient documentation

## 2023-11-23 DIAGNOSIS — Z9861 Coronary angioplasty status: Secondary | ICD-10-CM | POA: Insufficient documentation

## 2023-11-23 DIAGNOSIS — I2510 Atherosclerotic heart disease of native coronary artery without angina pectoris: Secondary | ICD-10-CM | POA: Insufficient documentation

## 2023-11-23 DIAGNOSIS — F411 Generalized anxiety disorder: Secondary | ICD-10-CM | POA: Diagnosis not present

## 2023-11-23 MED ORDER — LOSARTAN POTASSIUM 50 MG PO TABS
50.0000 mg | ORAL_TABLET | Freq: Every day | ORAL | 1 refills | Status: DC
  Filled 2023-11-23: qty 90, 90d supply, fill #0
  Filled 2023-11-25: qty 90, 90d supply, fill #1

## 2023-11-23 NOTE — Addendum Note (Signed)
 Addended by: KARNA FELLOWS on: 11/23/2023 10:48 AM   Modules accepted: Level of Service

## 2023-11-23 NOTE — Patient Instructions (Signed)
 Medication Instructions:  START Losartan  50 mg daily   *If you need a refill on your cardiac medications before your next appointment, please call your pharmacy*  Lab Work: Bmp in 1 week   If you have labs (blood work) drawn today and your tests are completely normal, you will receive your results only by: MyChart Message (if you have MyChart) OR A paper copy in the mail If you have any lab test that is abnormal or we need to change your treatment, we will call you to review the results.  Testing/Procedures: Carotid Ultrasound   Your physician has requested that you have a carotid duplex. This test is an ultrasound of the carotid arteries in your neck. It looks at blood flow through these arteries that supply the brain with blood. Allow one hour for this exam. There are no restrictions or special instructions.   Follow-Up: At [facility], you and your health needs are our priority.  As part of our continuing mission to provide you with exceptional heart care, our providers are all part of one team.  This team includes your primary Cardiologist (physician) and Advanced Practice Providers or APPs (Physician Assistants and Nurse Practitioners) who all work together to provide you with the care you need, when you need it.  Your next appointment:   3 month(s)  Provider:   Newman JINNY Lawrence, MD

## 2023-11-23 NOTE — Progress Notes (Signed)
 Internal Medicine Clinic Attending  I was physically present during the key portions of the resident provided service and participated in the medical decision making of patient's management care. I reviewed pertinent patient test results.  The assessment, diagnosis, and plan were formulated together and I agree with the documentation in the resident's note. Chronic, advanced COPD following with pulmonology for further management of refractory symptoms. No acute change in symptoms today to suggest exacerbation. We have attempted to arrange closer cardiac evaluation for potential contribution. Will need close f/u with ID for treatment of hepatitis C.   Karna Fellows, MD

## 2023-11-23 NOTE — Progress Notes (Signed)
 Cardiology Office Note:  .   Date:  11/23/2023  ID:  Francisco [name], DOB February 03, 1961, MRN [id number] PCP: Heddy Barren, DO  [facility] Providers Cardiologist:  Newman Lawrence, MD PCP: Heddy Barren, DO  Chief Complaint  Patient presents with   Establish Care   New Patient (Initial Visit)     Francisco [name] is a 63 y.o. male with hypertension, hyperlipidemia CAD, PAF, moderate carotid artery disease COPD, nicotine  dependence  Discussed the use of AI scribe software for clinical note transcription with the patient, who gave verbal consent to proceed.  History of Present Illness Francisco [name] is a 63 year old male with COPD and coronary artery disease who presents with shortness of breath.  He experiences severe shortness of breath, especially after minimal exertion like walking. He attributes this primarily to his COPD. He does not use home oxygen . He recently started Dupixent , with the next dose scheduled soon. He has reduced smoking from two packs to half a pack per day and uses nicotine  patches.  His cardiac history includes three stents, with the last catheterization in 2021 showing some narrowing but not severe. He had a heart attack during one stent placement. No current chest pain, but he recalls past heaviness. His heart function was previously reduced to 25-30% but has since normalized.  Patient has had recurrent hospitalizations with acute hypoxic respiratory failure and COPD exacerbation in the last few months.   Most recent event monitor did not show any A-fib.  There was documented A-fib on EKG in 07/2023, details below.  Family history includes significant cardiac events, with multiple relatives dying of heart attacks before age 18 and his mother dying of congestive heart failure at 51.      Vitals:   11/23/23 0852  BP: (!) 153/91  Pulse: 79  Resp: 16  SpO2: 91%      Review of Systems  Cardiovascular:  Positive for dyspnea on exertion. Negative for  chest pain, leg swelling, palpitations and syncope.        Studies Reviewed: SABRA       EKG 11/23/2023: Sinus rhythm with Premature atrial complexes Nonspecific ST and T wave abnormality When compared with ECG of 30-Oct-2023 02:59,  PAC's new    EKG 08/12/2023: Atrial fibrillation 85 bpm  Echocardiogram 07/2023: 1. Left ventricular ejection fraction, by estimation, is 60 to 65%. The  left ventricle has normal function. The left ventricle has no regional  wall motion abnormalities. There is mild left ventricular hypertrophy.  Left ventricular diastolic parameters are indeterminate.   2. Right ventricular systolic function is normal. The right ventricular  size is normal. There is normal pulmonary artery systolic pressure. The  estimated right ventricular systolic pressure is 14.6 mmHg.   3. The mitral valve is normal in structure. No evidence of mitral valve  regurgitation. No evidence of mitral stenosis.   4. The aortic valve is tricuspid. There is mild calcification of the  aortic valve. Aortic valve regurgitation is not visualized. Aortic valve  sclerosis is present, with no evidence of aortic valve stenosis.   5. The inferior vena cava is dilated in size with >50% respiratory  variability, suggesting right atrial pressure of 8 mmHg.   Long term monitor 09/2023: 30 nonsustained SVT (longest 14 beats) No atrial fibrillation detected. Rare supraventricular ectopy. Rare ventricular ectopy. No sustained arrhythmias. Symptom trigger episodes correspond to sinus rhythm, sinus tachycardia   Carotid US  01/2023: Right Carotid: Cannot rule out CCA occlusion, retrograde flow of the ECA  into the ICA, and occlusion of the ICA.  Left Carotid: At least 40-59% ICA stenosis. Higher velocities may be  obtained if patient was cooperative.  Vertebrals:  Bilateral vertebral arteries demonstrate antegrade flow.  Subclavians: Not assessed.   Coronary angiogram 2021:  Prox RCA to Mid RCA lesion  is 50% stenosed - In-stent re-stenosis of distal 1/2 of stented segment (by report 3 overlapping stents). The remainder of the stent segment is widely patent. Prox LAD lesion is 40% stenosed with 50% stenosed side branch in 1st Diag. Mid Cx lesion is 25% stenosed. Hemodynamic findings consistent with severe pulmonary hypertension. LV end diastolic pressure is severely elevated.   SUMMARY Moderate in-stent restenosis of the RCA with moderate bifurcation LAD-diagonal disease but no obstructive disease. 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 Have written for 80 mg of Lasix  x1 today and then 40 mg IV twice daily starting tomorrow. If urine output does not pick up, may want to consider milrinone.  Labs 4-10/2023: HbA1C 5.8% Hb 16.9 Cr 1.0 TSH 0.4  Labs 01/2023: Chol 134, TG 44, HDL 49, LDL 71   Risk Assessment/Calculations:    CHA2DS2-VASc Score = 4   This indicates a 4.8% annual risk of stroke. The patient's score is based upon: CHF History: 0 HTN History: 1 Diabetes History: 0 Stroke History: 2 Vascular Disease History: 1 Age Score: 0 Gender Score: 0     Physical Exam Vitals and nursing note reviewed.  Constitutional:      General: He is not in acute distress. Neck:     Vascular: No JVD.  Cardiovascular:     Rate and Rhythm: Normal rate and regular rhythm.     Heart sounds: Normal heart sounds. No murmur heard. Pulmonary:     Effort: Pulmonary effort is normal.     Breath sounds: Normal breath sounds. No wheezing or rales.  Musculoskeletal:     Right lower leg: No edema.     Left lower leg: No edema.      VISIT DIAGNOSES:    ICD-10-CM   1. CAD S/P percutaneous coronary angioplasty  I25.10 EKG 12-Lead   Z98.61 Basic metabolic panel with GFR    Basic metabolic panel with GFR    2. Paroxysmal atrial fibrillation (HCC)  I48.0     3. Primary hypertension  I10 Basic metabolic panel with GFR    Basic metabolic panel with GFR    4. Left carotid stenosis  I65.22 VAS US  CAROTID       Francisco [name] is a 63 y.o. male with hypertension, hyperlipidemia CAD, PAF, moderate carotid artery disease COPD, nicotine  dependence Assessment & Plan CAD: Known CAD with overlapping stents in RCA.  2021 coronary angiogram showed moderate in-stent restenosis. No clear anginal symptoms at this time, but has severe exertional dyspnea symptoms.  Thus far, these are attributed to COPD.  It would be reasonable to assess his coronary artery disease, but patient is not open to stress testing or coronary angiogram at this time.  He was to see how Dupixent  would help his COPD.  Reasonable to hold off CAD workup at this time. Continue medical management for CAD including metoprolol  tartrate 25 mg twice daily, Lipitor  40 mg daily, diltiazem  120 mg daily. Not  on aspirin  due to ongoing use of Eliquis  for A-fib. Blood pressure uncontrolled. Recommend losartan  50 mg daily.  Check BMP in 1 week.  Paroxysmal atrial fibrillation: Documented A-fib on EKG in 07/2023. Continue metoprolol  tartrate 25 mg daily. Continue Eliquis  5 mg twice daily. Losartan  added given paroxysmal A-fib and hypertension.  Hypertension: Management as above.  Moderate carotid artery stenosis: Moderate stenosis noted a year ago, requires monitoring for progression. - Order carotid ultrasound to evaluate current status. - Continue statin.  Nicotine  dependence: Tobacco cessation counseling:  - Currently smoking 1/2 pack/day, down from 2 pack/day in the past. - Patient was informed of the dangers of tobacco abuse including stroke, cancer, and MI, as well as benefits of tobacco  cessation. - Patient is willing to quit at this time. - Approximately 4 mins were spent counseling patient cessation techniques. We discussed various methods to help quit smoking, including deciding on a date to quit, joining a support group, pharmacological agents. Patient would like to continue using nicotine  patch.   - I will reassess his progress at the next follow-up visit   Meds ordered this encounter  Medications   losartan  (COZAAR ) 50 MG tablet    Sig: Take 1 tablet (50 mg total) by mouth daily.    Dispense:  90 tablet    Refill:  1     F/u in 3 months  Signed, Newman JINNY Lawrence, MD

## 2023-11-24 ENCOUNTER — Other Ambulatory Visit: Payer: Self-pay

## 2023-11-25 ENCOUNTER — Other Ambulatory Visit: Payer: Self-pay | Admitting: Student

## 2023-11-25 ENCOUNTER — Other Ambulatory Visit (HOSPITAL_COMMUNITY): Payer: Self-pay

## 2023-11-25 ENCOUNTER — Encounter (INDEPENDENT_AMBULATORY_CARE_PROVIDER_SITE_OTHER): Payer: Self-pay

## 2023-11-25 MED FILL — Metoprolol Tartrate Tab 25 MG: ORAL | 30 days supply | Qty: 60 | Fill #1 | Status: AC

## 2023-11-28 ENCOUNTER — Other Ambulatory Visit: Payer: Self-pay

## 2023-11-28 ENCOUNTER — Ambulatory Visit
Admission: RE | Admit: 2023-11-28 | Discharge: 2023-11-28 | Disposition: A | Discharge status: Home / Self Care | Source: Ambulatory Visit | Attending: Internal Medicine

## 2023-11-28 ENCOUNTER — Other Ambulatory Visit (HOSPITAL_COMMUNITY): Payer: Self-pay

## 2023-11-28 DIAGNOSIS — J449 Chronic obstructive pulmonary disease, unspecified: Secondary | ICD-10-CM

## 2023-11-28 DIAGNOSIS — F17210 Nicotine dependence, cigarettes, uncomplicated: Secondary | ICD-10-CM | POA: Diagnosis not present

## 2023-11-28 DIAGNOSIS — F17200 Nicotine dependence, unspecified, uncomplicated: Secondary | ICD-10-CM

## 2023-11-28 DIAGNOSIS — F411 Generalized anxiety disorder: Secondary | ICD-10-CM | POA: Diagnosis not present

## 2023-11-28 MED ORDER — LORATADINE 10 MG PO TABS
10.0000 mg | ORAL_TABLET | Freq: Every day | ORAL | 0 refills | Status: AC
  Filled 2023-11-28 – 2024-03-06 (×3): qty 100, 100d supply, fill #0

## 2023-11-28 MED ORDER — FLUTICASONE PROPIONATE 50 MCG/ACT NA SUSP
1.0000 | Freq: Every day | NASAL | 0 refills | Status: DC
  Filled 2023-11-28: qty 16, 60d supply, fill #0

## 2023-11-28 NOTE — Telephone Encounter (Signed)
 Medication sent to pharmacy

## 2023-11-28 NOTE — Progress Notes (Signed)
 Specialty Pharmacy Refill Coordination Note  Francisco [name] is a 63 y.o. male contacted today regarding refills of specialty medication(s) Dupilumab  (Dupixent )   Patient requested Delivery   Delivery date: 11/30/23   Verified address: [address] [place] [phone]   Medication will be filled on 11/29/23.

## 2023-11-29 ENCOUNTER — Ambulatory Visit (HOSPITAL_COMMUNITY)
Admission: RE | Admit: 2023-11-29 | Discharge: 2023-11-29 | Disposition: A | Discharge status: Home / Self Care | Source: Ambulatory Visit | Attending: Cardiology | Admitting: Cardiology

## 2023-11-29 ENCOUNTER — Other Ambulatory Visit: Payer: Self-pay

## 2023-11-29 DIAGNOSIS — I6522 Occlusion and stenosis of left carotid artery: Secondary | ICD-10-CM | POA: Diagnosis not present

## 2023-11-29 DIAGNOSIS — F411 Generalized anxiety disorder: Secondary | ICD-10-CM | POA: Diagnosis not present

## 2023-11-29 DIAGNOSIS — Z419 Encounter for procedure for purposes other than remedying health state, unspecified: Secondary | ICD-10-CM | POA: Diagnosis not present

## 2023-11-29 NOTE — Progress Notes (Signed)
 Refill too soon until 8.14. Attempted to call patient but number not in service, sent mychart.

## 2023-11-30 ENCOUNTER — Ambulatory Visit: Payer: Self-pay | Admitting: Cardiology

## 2023-12-01 ENCOUNTER — Other Ambulatory Visit: Payer: Self-pay

## 2023-12-02 ENCOUNTER — Other Ambulatory Visit: Payer: Self-pay

## 2023-12-02 DIAGNOSIS — F411 Generalized anxiety disorder: Secondary | ICD-10-CM | POA: Diagnosis not present

## 2023-12-02 NOTE — Progress Notes (Signed)
 Patient called stating that driver needs to deliver to door on right hand side of porch, there is no access to residence at front door. Called [employer] to inform driver.

## 2023-12-06 ENCOUNTER — Encounter: Payer: Self-pay | Admitting: Infectious Diseases

## 2023-12-06 ENCOUNTER — Ambulatory Visit (INDEPENDENT_AMBULATORY_CARE_PROVIDER_SITE_OTHER): Admitting: Infectious Diseases

## 2023-12-06 ENCOUNTER — Other Ambulatory Visit (HOSPITAL_COMMUNITY)
Admission: RE | Admit: 2023-12-06 | Discharge: 2023-12-06 | Disposition: A | Discharge status: Home / Self Care | Source: Ambulatory Visit | Attending: Infectious Diseases | Admitting: Infectious Diseases

## 2023-12-06 VITALS — BP 138/85 | HR 75 | Temp 98.4°F | Ht 72.0 in | Wt 183.6 lb

## 2023-12-06 DIAGNOSIS — B182 Chronic viral hepatitis C: Secondary | ICD-10-CM

## 2023-12-06 NOTE — Addendum Note (Signed)
 Addended by: ANTONE DWAYNE SAILOR on: 12/06/2023 03:36 PM   Modules accepted: Orders

## 2023-12-06 NOTE — Assessment & Plan Note (Addendum)
 He has previous positive LV from 2023.  He needs further testing for Hep A and B ( to assess need for vaccines) as well as Hep C genotype and elastogram.  I discussed these with him. Last HIV test 2024, will repeat.  With his active drug use, will need continued surveilance for HIV, STI, hepatitis.  Will plan on getting these done over the next month, see him back in a month.  Will also check his INR.  Hopefully will be able to get 8 weeks of tx.

## 2023-12-06 NOTE — Progress Notes (Signed)
 Subjective:    Patient ID: Francisco [name], male  DOB: 1960-07-02, 63 y.o.        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 He recently started dupixent  and feels like his breathing is the same.  Down to 1/2 ppd, since 63 yo.  He was tested positive 11-21-23 with vl of 3.5 million. His Hep B S Ab was (-). His FIB 4 based on labs from June 2025 is 4.21 which suggests risk of cirrhosis. He had a RUQ u/s done 06-3023 which did not show cirrhosis.  Untreated for Hep C. Never hospitalized for liver issues- no hx of nose/gum/varicies/GI bleeding. Has had hemmerhoids, many years ago.  No hx of juandice. No hx of encephalopathy.  No ETOH.  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    Health Maintenance  Topic Date Due  . DTaP/Tdap/Td (1 - Tdap) Never done  . Colonoscopy  Never done  . Zoster Vaccines- Shingrix (1 of 2) Never done  . Hepatitis B Vaccines 19-59 Average Risk (1 of 3 - Risk 3-dose series) Never done  . Lung Cancer Screening  08/14/2022  . COVID-19 Vaccine (1 - 2024-25 season) Never done  . INFLUENZA VACCINE  11/18/2023  . Pneumococcal Vaccine: 50+ Years  Completed  . Hepatitis C Screening  Completed  . HIV Screening  Completed  . HPV VACCINES  Aged Out  . Meningococcal B Vaccine  Aged Out  . Pneumococcal Vaccine  Discontinued      Review of Systems  Constitutional:  Negative for chills, fever and weight loss.  Respiratory:  Positive for cough, sputum production and shortness of breath. Negative for hemoptysis.   Cardiovascular:  Negative for chest pain and leg swelling.  Gastrointestinal:  Negative for blood in stool and constipation.  Genitourinary:  Negative for dysuria.  Psychiatric/Behavioral:  The patient has insomnia.    Intolerant of CPAP,  Please see HPI. All other systems reviewed and negative.     Objective:  Physical Exam Vitals reviewed.  Constitutional:      General:  He is not in acute distress.    Appearance: Normal appearance. He is not ill-appearing or toxic-appearing.  HENT:     Mouth/Throat:     Mouth: Mucous membranes are moist.     Pharynx: No oropharyngeal exudate.  Eyes:     Extraocular Movements: Extraocular movements intact.     Conjunctiva/sclera: Conjunctivae normal.     Pupils: Pupils are equal, round, and reactive to light.  Cardiovascular:     Rate and Rhythm: Normal rate and regular rhythm.  Pulmonary:     Effort: Pulmonary effort is normal.     Breath sounds: Normal breath sounds.  Abdominal:     General: Bowel sounds are normal. There is no distension.     Palpations: Abdomen is soft.     Tenderness: There is no abdominal tenderness.  Musculoskeletal:        General: Normal range of motion.     Cervical back: Normal range of motion and neck supple.     Right lower leg: No edema.     Left lower leg: No edema.  Skin:    Coloration: Skin is not jaundiced.     Findings: No lesion.  Neurological:     General: No focal deficit present.     Mental Status: He is alert.  Psychiatric:  Mood and Affect: Mood normal.           Assessment & Plan:

## 2023-12-07 ENCOUNTER — Other Ambulatory Visit (HOSPITAL_COMMUNITY): Payer: Self-pay

## 2023-12-07 ENCOUNTER — Ambulatory Visit: Admitting: Pulmonary Disease

## 2023-12-07 ENCOUNTER — Encounter: Payer: Self-pay | Admitting: Pulmonary Disease

## 2023-12-07 VITALS — BP 131/77 | HR 78 | Temp 97.7°F | Ht 72.0 in | Wt 183.8 lb

## 2023-12-07 DIAGNOSIS — R0609 Other forms of dyspnea: Secondary | ICD-10-CM

## 2023-12-07 DIAGNOSIS — J449 Chronic obstructive pulmonary disease, unspecified: Secondary | ICD-10-CM

## 2023-12-07 DIAGNOSIS — F17210 Nicotine dependence, cigarettes, uncomplicated: Secondary | ICD-10-CM | POA: Diagnosis not present

## 2023-12-07 DIAGNOSIS — J441 Chronic obstructive pulmonary disease with (acute) exacerbation: Secondary | ICD-10-CM

## 2023-12-07 LAB — URINE CYTOLOGY ANCILLARY ONLY
Chlamydia: NEGATIVE
Comment: NEGATIVE
Comment: NORMAL
Neisseria Gonorrhea: NEGATIVE

## 2023-12-07 NOTE — Progress Notes (Signed)
 @Patient  ID: Francisco [name], male    DOB: 1961-03-25, 63 y.o.   MRN: [id number]  Chief Complaint  Patient presents with   Follow-up    Copd Started dupixent  3 weeks ago. Pt is complaining of severe SOB.     Referring provider: Heddy Barren, DO  HPI:   63 y.o. man with very severe COPD whom are seen for evaluation of the same as well as dyspnea on exertion.  Discharge summary reviewed in interim.  ED note reviewed in the interim.  Cardiology note reviewed in the interim.  Here for scheduled follow-up.  Started Dupixent  in the interim.  Second injection last Monday.  Had 2 good days last week.  Certainly not a robust response but he stated these are the best day she has had in some time.  I am optimistic this is helping.  HPI initial visit: Patient longstanding smoker.  Working in 2024.  Unfortunately in the late winter/spring 2025 developed worsening symptoms.  Worsening shortness of breath.  Chest congestion.  Recurrent visits to the ED.  Often treated for exacerbations of underlying presumed COPD.  Improved reliably with steroids antibiotics and nebulizer treatments.  So severe 07/2023 led to intubation.  He was on the ventilator for a few days.  Subsequently improved.  Was seen by pulmonary Dr. Cole in clinic 08/2023.  PFTs reviewed.  This reveals very severe obstruction, air trapping, severely reduced DLCO.  Placed on Trelegy.  Since being on Trelegy has been out of the hospital for over a month now.  This seems to be an improvement.  However day-to-day symptoms seem unchanged.  We discussed his mild eosinophilia as possible reason for recent exacerbations, multiple ED visits.  We discussed being aggressive as possible to see to prevent this.  We discussed biologic therapy and he is eager to move forward with this.  He denies history of diagnosis of asthma.  He is still smoking.  Reviewed chest x-ray 1 view 05/2023 reveals clear lungs, mild hyperinflation.  Reviewed chest x-ray 2 view  x-ray 25 reveals hyperinflation of the PA and the lateral clear lungs.  Reveal chest x-ray leading to prolonged hospitalization 08/09/2023 with clear lungs bilaterally.  Questionaires / Pulmonary Flowsheets:   ACT:      No data to display          MMRC:     No data to display          Epworth:      No data to display          Tests:   FENO:  No results found for: NITRICOXIDE  PFT:     No data to display          WALK:      No data to display          Imaging: Personally reviewed and as per EMR and discussion in this note VAS US  CAROTID Result Date: 11/29/2023 Carotid Arterial Duplex Study Patient Name:  Francisco [name]  Date of Exam:   11/29/2023 Medical Rec #: [id number]    Accession #:    [id number] Date of Birth: 31-Aug-1960    Patient Gender: M Patient Age:   63 years Exam Location:  [address] Procedure:      VAS US  CAROTID Referring Phys: [facility] PATWARDHAN --------------------------------------------------------------------------------  Indications:       Carotid artery disease.                    Patient denies any  cerebrovascular symptoms. Risk Factors:      Hypertension, current smoker, coronary artery disease, prior                    CVA. Comparison Study:  Previous carotid duplex performed 02/14/23 showed right CCA                    occlusion with retrograde flow of the ECA into ICA and LICA                    velocities of 152/42 cm/sec. Performing Technologist: Edsel Mustard RVT  Examination Guidelines: A complete evaluation includes B-mode imaging, spectral Doppler, color Doppler, and power Doppler as needed of all accessible portions of each vessel. Bilateral testing is considered an integral part of a complete examination. Limited examinations for reoccurring indications may be performed as noted.  Right Carotid Findings: +----------+--------+--------+--------+-----------------+----------------------+           PSV cm/sEDV cm/sStenosisPlaque            Comments                                                 Description                             +----------+--------+--------+--------+-----------------+----------------------+ CCA Prox  0       0       Occluded                                        +----------+--------+--------+--------+-----------------+----------------------+ CCA Distal0       0       Occluded                                        +----------+--------+--------+--------+-----------------+----------------------+ ICA Prox  116     42              focal and                                                                 heterogenous                            +----------+--------+--------+--------+-----------------+----------------------+ ICA Mid   61      29                               tardus parvus          +----------+--------+--------+--------+-----------------+----------------------+ ICA Distal68      35                               tardus parvus          +----------+--------+--------+--------+-----------------+----------------------+ ECA  81                                       Retrograde flow into                                                      the ICA                +----------+--------+--------+--------+-----------------+----------------------+ +----------+--------+-------+----------------+-------------------+           PSV cm/sEDV cmsDescribe        Arm Pressure (mmHG) +----------+--------+-------+----------------+-------------------+ [id number]            Multiphasic, WNL                    +----------+--------+-------+----------------+-------------------+ +---------+--------+---+--------+--+---------+ VertebralPSV cm/s103EDV cm/s34Antegrade +---------+--------+---+--------+--+---------+  Left Carotid Findings: +----------+--------+--------+--------+------------------+---------+           PSV cm/sEDV cm/sStenosisPlaque  DescriptionComments  +----------+--------+--------+--------+------------------+---------+ CCA Prox  88      27                                          +----------+--------+--------+--------+------------------+---------+ CCA Distal58      22      <50%    heterogenous                +----------+--------+--------+--------+------------------+---------+ ICA Prox  94      39              calcific          Shadowing +----------+--------+--------+--------+------------------+---------+ ICA Mid   107     35      1-39%                               +----------+--------+--------+--------+------------------+---------+ ICA Distal82      27                                          +----------+--------+--------+--------+------------------+---------+ ECA       115     14                                          +----------+--------+--------+--------+------------------+---------+ +----------+--------+--------+----------------+-------------------+           PSV cm/sEDV cm/sDescribe        Arm Pressure (mmHG) +----------+--------+--------+----------------+-------------------+ [id number]             Multiphasic, WNL                    +----------+--------+--------+----------------+-------------------+ +---------+--------+--+--------+--+---------+ VertebralPSV cm/s60EDV cm/s19Antegrade +---------+--------+--+--------+--+---------+   Summary: Right Carotid: The CCA appears occluded. Retrograde flow of the ECA into the ICA                with tardus parvus waveforms, cannot rule out occlusion of the                ICA. Left Carotid: Velocities in the  left ICA are consistent with at least 1-39%               stenosis, may be higher than able to insonate due to areas of               shadowing from calcific plaque. Non-hemodynamically significant               plaque <50% noted in the CCA. Vertebrals:  Bilateral vertebral arteries demonstrate antegrade flow. Subclavians:  Normal flow hemodynamics were seen in bilateral subclavian              arteries. *See table(s) above for measurements and observations.  Electronically signed by Dorn Lesches MD on 11/29/2023 at 4:20:35 PM.    Final     Lab Results: Personally reviewed CBC    Component Value Date/Time   WBC 8.9 12/06/2023 1528   WBC 17.0 (H) 10/30/2023 0306   RBC 4.85 12/06/2023 1528   RBC 5.26 10/30/2023 0306   HGB 15.5 12/06/2023 1528   HCT 47.1 12/06/2023 1528   PLT 285 12/06/2023 1528   MCV 97 12/06/2023 1528   MCH 32.0 12/06/2023 1528   MCH 32.1 10/30/2023 0306   MCHC 32.9 12/06/2023 1528   MCHC 32.8 10/30/2023 0306   RDW 13.6 12/06/2023 1528   LYMPHSABS 3.7 10/30/2023 0306   LYMPHSABS 1.7 05/18/2021 1518   MONOABS 1.5 (H) 10/30/2023 0306   EOSABS 0.2 10/30/2023 0306   EOSABS 0.0 05/18/2021 1518   BASOSABS 0.1 10/30/2023 0306   BASOSABS 0.0 05/18/2021 1518    BMET    Component Value Date/Time   NA 144 12/06/2023 1528   K 3.9 12/06/2023 1528   CL 103 12/06/2023 1528   CO2 25 12/06/2023 1528   GLUCOSE 84 12/06/2023 1528   GLUCOSE 129 (H) 10/30/2023 0306   BUN 9 12/06/2023 1528   CREATININE 1.02 12/06/2023 1528   CALCIUM  9.6 12/06/2023 1528   GFRNONAA >60 10/30/2023 0306   GFRAA >60 01/11/2020 0422    BNP    Component Value Date/Time   BNP 166.9 (H) 10/22/2023 1030    ProBNP No results found for: PROBNP  Specialty Problems       Pulmonary Problems   COPD (chronic obstructive pulmonary disease) (HCC)   CAP (community acquired pneumonia)   Lung nodules   COPD exacerbation (HCC)   Acute hypoxic respiratory failure (HCC)    Allergies  Allergen Reactions   Ivp Dye [Iodinated Contrast Media] Other (See Comments)    Seizures    Tramadol Other (See Comments)    Seizures    Doxycycline  Other (See Comments)    Burning sensation to his hands.    Immunization History  Administered Date(s) Administered   Influenza,inj,Quad PF,6+ Mos 06/17/2018   PNEUMOCOCCAL  CONJUGATE-20 11/21/2023   Pneumococcal Polysaccharide-23 06/17/2018    Past Medical History:  Diagnosis Date   CHF (congestive heart failure) (HCC)    COPD (chronic obstructive pulmonary disease) (HCC)    COPD with acute exacerbation (HCC) 10/21/2021   Coronary artery disease    a. s/p prior PCI.   Hepatitis-C    Hypertension    IV drug abuse (HCC)    a. 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   Medically noncompliant    MI (myocardial infarction) (HCC)    Tobacco abuse     Tobacco History: Social History   Tobacco Use  Smoking Status Every Day   Current packs/day: 0.50   Types: Cigarettes   Passive exposure: Current  Smokeless Tobacco Never  Tobacco Comments   Half a pack a day as of 12/07/2023   Ready to quit: Not Answered Counseling given: Not Answered Tobacco comments: Half a pack a day as of 12/07/2023    Outpatient Encounter Medications as of 12/07/2023  Medication Sig   albuterol  (PROVENTIL ) (2.5 MG/3ML) 0.083% nebulizer solution Use 1 vial (2.5 mg total) by nebulization every 6 (six) hours as needed for wheezing or shortness of breath.   albuterol  (VENTOLIN  HFA) 108 (90 Base) MCG/ACT inhaler Inhale 2 puffs into the lungs every 4 (four) hours as needed for wheezing or shortness of breath.   apixaban  (ELIQUIS ) 5 MG TABS tablet Take 1 tablet (5 mg total) by mouth 2 (two) times daily.   atorvastatin  (LIPITOR ) 40 MG tablet Take 1 tablet (40 mg total) by mouth daily.   diltiazem  (CARDIZEM  CD) 120 MG 24 hr capsule Take 1 capsule (120 mg total) by mouth daily.   Dupilumab  (DUPIXENT ) 300 MG/2ML SOAJ Inject 300 mg into the skin every 14 (fourteen) days.   fluticasone  (FLONASE ) 50 MCG/ACT nasal spray Place 1 spray into both nostrils daily.   Fluticasone -Umeclidin-Vilant (TRELEGY ELLIPTA ) 100-62.5-25 MCG/ACT AEPB Inhale 1 puff into the lungs daily.   loratadine  (CLARITIN ) 10 MG tablet Take 1 tablet (10 mg total) by mouth daily.   losartan  (COZAAR ) 50 MG tablet  Take 1 tablet (50 mg total) by mouth daily.   metoprolol  tartrate (LOPRESSOR ) 25 MG tablet Take 1 tablet (25 mg total) by mouth 2 (two) times daily.   nicotine  (NICODERM CQ  - DOSED IN MG/24 HOURS) 14 mg/24hr patch Place 1 patch (14 mg total) onto the skin daily.   nitroGLYCERIN  (NITROSTAT ) 0.4 MG SL tablet Place 1 tablet (0.4 mg total) under the tongue every 5 (five) minutes x 3 doses as needed for chest pain.   thiamine  (VITAMIN B-1) 100 MG tablet Take 1 tablet (100 mg total) by mouth daily.   No facility-administered encounter medications on file as of 12/07/2023.     Review of Systems  Review of Systems  N/a Physical Exam  BP 131/77   Pulse 78   Temp 97.7 F (36.5 C)   Ht 6' (1.829 m)   Wt 183 lb 12.8 oz (83.4 kg)   SpO2 91% Comment: RA  BMI 24.93 kg/m   Wt Readings from Last 5 Encounters:  12/07/23 183 lb 12.8 oz (83.4 kg)  12/06/23 183 lb 9.6 oz (83.3 kg)  11/23/23 186 lb 2 oz (84.4 kg)  11/21/23 183 lb 6.4 oz (83.2 kg)  10/30/23 175 lb (79.4 kg)    BMI Readings from Last 5 Encounters:  12/07/23 24.93 kg/m  12/06/23 24.90 kg/m  11/23/23 25.24 kg/m  11/21/23 24.87 kg/m  10/30/23 23.73 kg/m     Physical Exam General: Sitting in chair, no acute distress Eyes: EOMI, no icterus Neck: Supple, no JVP Pulmonary: Clear, distant, faint end expiratory wheeze scattered bilaterally Cardiovascular: Warm, no edema Abdomen: Nondistended MSK: No synovitis, no joint effusion Neuro: Normal gait, no weakness Psych: Normal mood, full affect   Assessment & Plan:   Very severe COPD Gold D with acute exacerbation: FEV1 23% of predicted 08/2023.  With evidence of air trapping, severely restricted DLCO.  Appropriately on Trelegy given frequent exacerbations.  Eosinophil count of 300 2024.  Started Dupixent  11/2023 with some marginal improvement.  Still significant symptoms.  Continue Trelegy.  I am optimistic with further addition of Dupixent  will continue some improvement.   Would certainly administer for at least  3 months before passing full judgment, I have seen patients take up to 6 months to show significant improvement with Biologics.  Dyspnea on exertion: Likely multifactorial due to significant and severe COPD as well as cardiac etiologies.  Will tend to be as aggressive as possible from a pulmonary standpoint to see if we can improve this.  Unfortunately despite increasing medications, not a lot of improvement.  Encouraged ongoing cardiology follow-up.  It appears patient declined ischemic workup at last visit.  Return in about 3 months (around 03/08/2024) for f/u Dr. Annella.   Donnice JONELLE Annella, MD 12/07/2023

## 2023-12-07 NOTE — Patient Instructions (Signed)
 Nice to see you again  No changes to medication  I am cautiously optimistic that Dupixent  has helped a bit lets continue this  Return to clinic in 3 months or sooner as needed with Dr. Annella

## 2023-12-09 LAB — HEPATITIS C GENOTYPE

## 2023-12-09 LAB — CBC
Hematocrit: 47.1 % (ref 37.5–51.0)
Hemoglobin: 15.5 g/dL (ref 13.0–17.7)
MCH: 32 pg (ref 26.6–33.0)
MCHC: 32.9 g/dL (ref 31.5–35.7)
MCV: 97 fL (ref 79–97)
Platelets: 285 x10E3/uL (ref 150–450)
RBC: 4.85 x10E6/uL (ref 4.14–5.80)
RDW: 13.6 % (ref 11.6–15.4)
WBC: 8.9 x10E3/uL (ref 3.4–10.8)

## 2023-12-09 LAB — COMPREHENSIVE METABOLIC PANEL WITH GFR
ALT: 212 IU/L — ABNORMAL HIGH (ref 0–44)
AST: 122 IU/L — ABNORMAL HIGH (ref 0–40)
Albumin: 4.1 g/dL (ref 3.9–4.9)
Alkaline Phosphatase: 76 IU/L (ref 44–121)
BUN/Creatinine Ratio: 9 — ABNORMAL LOW (ref 10–24)
BUN: 9 mg/dL (ref 8–27)
Bilirubin Total: 0.5 mg/dL (ref 0.0–1.2)
CO2: 25 mmol/L (ref 20–29)
Calcium: 9.6 mg/dL (ref 8.6–10.2)
Chloride: 103 mmol/L (ref 96–106)
Creatinine, Ser: 1.02 mg/dL (ref 0.76–1.27)
Globulin, Total: 2.8 g/dL (ref 1.5–4.5)
Glucose: 84 mg/dL (ref 70–99)
Potassium: 3.9 mmol/L (ref 3.5–5.2)
Sodium: 144 mmol/L (ref 134–144)
Total Protein: 6.9 g/dL (ref 6.0–8.5)
eGFR: 83 mL/min/1.73 (ref >59)

## 2023-12-09 LAB — PROTIME-INR
INR: 1.1 (ref 0.9–1.2)
Prothrombin Time: 12 s (ref 9.1–12.0)

## 2023-12-09 LAB — HEPATITIS A ANTIBODY, TOTAL: hep A Total Ab: NEGATIVE

## 2023-12-09 LAB — HEPATITIS B SURFACE ANTIGEN: Hepatitis B Surface Ag: NEGATIVE

## 2023-12-09 LAB — HEPATITIS B CORE ANTIBODY, TOTAL: Hep B Core Total Ab: NEGATIVE

## 2023-12-09 LAB — RPR: RPR Ser Ql: NONREACTIVE

## 2023-12-09 LAB — HIV ANTIBODY (ROUTINE TESTING W REFLEX): HIV Screen 4th Generation wRfx: NONREACTIVE

## 2023-12-12 ENCOUNTER — Telehealth: Payer: Self-pay | Admitting: *Deleted

## 2023-12-12 NOTE — Telephone Encounter (Signed)
 Copied from CRM #[id number]. Topic: Clinical - Lab/Test Results >> Dec 12, 2023 11:00 AM Susanna ORN wrote: Reason for CRM: Patient calling to get results of the CT scan that he completed last week at [facility]. Please give patient a call back once provider has reviewed the results. CB #: [id number].

## 2023-12-15 ENCOUNTER — Other Ambulatory Visit

## 2023-12-16 ENCOUNTER — Other Ambulatory Visit (HOSPITAL_COMMUNITY): Payer: Self-pay

## 2023-12-16 ENCOUNTER — Other Ambulatory Visit: Payer: Self-pay | Admitting: Student

## 2023-12-16 MED ORDER — DILTIAZEM HCL ER COATED BEADS 120 MG PO CP24
120.0000 mg | ORAL_CAPSULE | Freq: Every day | ORAL | 1 refills | Status: DC
  Filled 2023-12-16: qty 30, 30d supply, fill #0
  Filled 2024-01-17: qty 30, 30d supply, fill #1

## 2023-12-20 ENCOUNTER — Other Ambulatory Visit (HOSPITAL_COMMUNITY): Payer: Self-pay

## 2023-12-22 ENCOUNTER — Other Ambulatory Visit: Payer: Self-pay

## 2023-12-25 ENCOUNTER — Other Ambulatory Visit: Payer: Self-pay

## 2023-12-25 ENCOUNTER — Emergency Department (HOSPITAL_COMMUNITY)

## 2023-12-25 ENCOUNTER — Other Ambulatory Visit (HOSPITAL_COMMUNITY): Payer: Self-pay

## 2023-12-25 ENCOUNTER — Emergency Department (HOSPITAL_COMMUNITY)
Admission: EM | Admit: 2023-12-25 | Discharge: 2023-12-25 | Disposition: A | Discharge status: Home / Self Care | Attending: Emergency Medicine | Admitting: Emergency Medicine

## 2023-12-25 ENCOUNTER — Encounter (HOSPITAL_COMMUNITY): Payer: Self-pay

## 2023-12-25 DIAGNOSIS — Z7951 Long term (current) use of inhaled steroids: Secondary | ICD-10-CM | POA: Insufficient documentation

## 2023-12-25 DIAGNOSIS — F17200 Nicotine dependence, unspecified, uncomplicated: Secondary | ICD-10-CM | POA: Diagnosis not present

## 2023-12-25 DIAGNOSIS — I110 Hypertensive heart disease with heart failure: Secondary | ICD-10-CM | POA: Insufficient documentation

## 2023-12-25 DIAGNOSIS — I2510 Atherosclerotic heart disease of native coronary artery without angina pectoris: Secondary | ICD-10-CM | POA: Diagnosis not present

## 2023-12-25 DIAGNOSIS — I5020 Unspecified systolic (congestive) heart failure: Secondary | ICD-10-CM | POA: Diagnosis not present

## 2023-12-25 DIAGNOSIS — Z7952 Long term (current) use of systemic steroids: Secondary | ICD-10-CM | POA: Diagnosis not present

## 2023-12-25 DIAGNOSIS — R0602 Shortness of breath: Secondary | ICD-10-CM | POA: Diagnosis not present

## 2023-12-25 DIAGNOSIS — Z7901 Long term (current) use of anticoagulants: Secondary | ICD-10-CM | POA: Diagnosis not present

## 2023-12-25 DIAGNOSIS — J441 Chronic obstructive pulmonary disease with (acute) exacerbation: Secondary | ICD-10-CM | POA: Insufficient documentation

## 2023-12-25 DIAGNOSIS — R069 Unspecified abnormalities of breathing: Secondary | ICD-10-CM | POA: Diagnosis not present

## 2023-12-25 DIAGNOSIS — Z79899 Other long term (current) drug therapy: Secondary | ICD-10-CM | POA: Diagnosis not present

## 2023-12-25 DIAGNOSIS — Z8673 Personal history of transient ischemic attack (TIA), and cerebral infarction without residual deficits: Secondary | ICD-10-CM | POA: Diagnosis not present

## 2023-12-25 DIAGNOSIS — R059 Cough, unspecified: Secondary | ICD-10-CM | POA: Diagnosis present

## 2023-12-25 LAB — I-STAT CHEM 8, ED
BUN: 20 mg/dL (ref 8–23)
Calcium, Ion: 1.22 mmol/L (ref 1.15–1.40)
Chloride: 101 mmol/L (ref 98–111)
Creatinine, Ser: 1.1 mg/dL (ref 0.61–1.24)
Glucose, Bld: 124 mg/dL — ABNORMAL HIGH (ref 70–99)
HCT: 46 % (ref 39.0–52.0)
Hemoglobin: 15.6 g/dL (ref 13.0–17.0)
Potassium: 4.2 mmol/L (ref 3.5–5.1)
Sodium: 144 mmol/L (ref 135–145)
TCO2: 35 mmol/L — ABNORMAL HIGH (ref 22–32)

## 2023-12-25 LAB — RESP PANEL BY RT-PCR (RSV, FLU A&B, COVID)  RVPGX2
Influenza A by PCR: NEGATIVE
Influenza B by PCR: NEGATIVE
Resp Syncytial Virus by PCR: NEGATIVE
SARS Coronavirus 2 by RT PCR: NEGATIVE

## 2023-12-25 LAB — I-STAT VENOUS BLOOD GAS, ED
Acid-Base Excess: 8 mmol/L — ABNORMAL HIGH (ref 0.0–2.0)
Bicarbonate: 38.3 mmol/L — ABNORMAL HIGH (ref 20.0–28.0)
Calcium, Ion: 1.2 mmol/L (ref 1.15–1.40)
HCT: 45 % (ref 39.0–52.0)
Hemoglobin: 15.3 g/dL (ref 13.0–17.0)
O2 Saturation: 60 %
Potassium: 4.1 mmol/L (ref 3.5–5.1)
Sodium: 143 mmol/L (ref 135–145)
TCO2: 41 mmol/L — ABNORMAL HIGH (ref 22–32)
pCO2, Ven: 77 mmHg (ref 44–60)
pH, Ven: 7.304 (ref 7.25–7.43)
pO2, Ven: 36 mmHg (ref 32–45)

## 2023-12-25 LAB — BRAIN NATRIURETIC PEPTIDE: B Natriuretic Peptide: 109.8 pg/mL — ABNORMAL HIGH (ref 0.0–100.0)

## 2023-12-25 LAB — CBC
HCT: 47.7 % (ref 39.0–52.0)
Hemoglobin: 15.5 g/dL (ref 13.0–17.0)
MCH: 31.7 pg (ref 26.0–34.0)
MCHC: 32.5 g/dL (ref 30.0–36.0)
MCV: 97.5 fL (ref 80.0–100.0)
Platelets: 285 K/uL (ref 150–400)
RBC: 4.89 MIL/uL (ref 4.22–5.81)
RDW: 13.3 % (ref 11.5–15.5)
WBC: 11.6 K/uL — ABNORMAL HIGH (ref 4.0–10.5)
nRBC: 0 % (ref 0.0–0.2)

## 2023-12-25 LAB — COMPREHENSIVE METABOLIC PANEL WITH GFR
ALT: 158 U/L — ABNORMAL HIGH (ref 0–44)
AST: 55 U/L — ABNORMAL HIGH (ref 15–41)
Albumin: 3.4 g/dL — ABNORMAL LOW (ref 3.5–5.0)
Alkaline Phosphatase: 66 U/L (ref 38–126)
Anion gap: 10 (ref 5–15)
BUN: 14 mg/dL (ref 8–23)
CO2: 30 mmol/L (ref 22–32)
Calcium: 9.2 mg/dL (ref 8.9–10.3)
Chloride: 103 mmol/L (ref 98–111)
Creatinine, Ser: 1 mg/dL (ref 0.61–1.24)
GFR, Estimated: >60 mL/min (ref >60)
Glucose, Bld: 190 mg/dL — ABNORMAL HIGH (ref 70–99)
Potassium: 3.6 mmol/L (ref 3.5–5.1)
Sodium: 143 mmol/L (ref 135–145)
Total Bilirubin: 0.5 mg/dL (ref 0.0–1.2)
Total Protein: 6.7 g/dL (ref 6.5–8.1)

## 2023-12-25 MED ORDER — AEROCHAMBER PLUS FLO-VU LARGE MISC: 1.0000 | Freq: Once | Status: DC

## 2023-12-25 MED ORDER — PREDNISONE 20 MG PO TABS
40.0000 mg | ORAL_TABLET | Freq: Every day | ORAL | 0 refills | Status: AC
Start: 2023-12-25 — End: 2023-12-29
  Filled 2023-12-25: qty 8, 4d supply, fill #0

## 2023-12-25 MED ORDER — ALBUTEROL SULFATE HFA 108 (90 BASE) MCG/ACT IN AERS
2.0000 | INHALATION_SPRAY | Freq: Once | RESPIRATORY_TRACT | Status: AC
  Administered 2023-12-25: 2 via RESPIRATORY_TRACT
  Filled 2023-12-25: qty 6.7

## 2023-12-25 MED ORDER — IPRATROPIUM-ALBUTEROL 0.5-2.5 (3) MG/3ML IN SOLN
3.0000 mL | Freq: Once | RESPIRATORY_TRACT | Status: AC
  Administered 2023-12-25: 3 mL via RESPIRATORY_TRACT
  Filled 2023-12-25: qty 3

## 2023-12-25 NOTE — ED Provider Notes (Signed)
 [place] EMERGENCY DEPARTMENT AT [facility] Provider Note  CSN: [id number] Arrival date & time: 12/25/23 0113  Chief Complaint(s) Respiratory Distress  HPI Francisco [name] is a 63 y.o. male with a past medical history listed below including hypertension, CHF with a last EF of 60 to 65% in April of this year, atrial fibrillation on Eliquis , COPD recently started on Dupixent  not on supplemental oxygen  here for several days of gradually worsening shortness of breath.  Patient was awake and approximately 45 minutes to an hour prior to arrival feeling more short of breath prompting a call to EMS.  He was noted to be satting 82% on room air.  He was given 2 DuoNebs, Solu-Medrol  and magnesium  and route.  No recent fevers or infections.  Patient is coughing up clear sputum.  The history is provided by the patient.    Past Medical History Past Medical History:  Diagnosis Date   CHF (congestive heart failure) (HCC)    COPD (chronic obstructive pulmonary disease) (HCC)    COPD with acute exacerbation (HCC) 10/21/2021   Coronary artery disease    a. s/p prior PCI.   Hepatitis-C    Hypertension    IV drug abuse (HCC)    a. 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   Medically noncompliant    MI (myocardial infarction) (HCC)    Tobacco abuse    Patient Active Problem List   Diagnosis Date Noted   Acute hypoxic respiratory failure (HCC) 10/30/2023   Atrial flutter with rapid ventricular response (HCC) 08/14/2023   COPD exacerbation (HCC) 08/13/2023   Paroxysmal atrial fibrillation (HCC) 08/13/2023   Hypokalemia 08/13/2023   Malnutrition of moderate degree 08/10/2023   Seasonal allergies 07/29/2023   TIA (transient ischemic attack) 02/14/2023   Benzodiazepine causing adverse effect in therapeutic use 02/14/2023   History of stroke 02/13/2023   Pulmonary hypertension (HCC) 02/05/2023   Fatigue 09/28/2022   Right knee pain 08/26/2022   Patient cannot afford medications  05/05/2022   Weight loss 02/19/2022   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 Authorizing Provider  predniSONE  (DELTASONE ) 20 MG tablet Take 2 tablets (40 mg total) by mouth daily with breakfast for 4 days. 12/25/23 12/29/23 Yes Artin Mceuen, Raynell Moder, MD  albuterol  (PROVENTIL ) (2.5 MG/3ML) 0.083% nebulizer solution Use 1 vial (2.5 mg total) by nebulization every 6 (six) hours as needed for wheezing or shortness of breath. 06/16/23   Atway, Rayann N, DO  albuterol  (VENTOLIN  HFA) 108 (90 Base) MCG/ACT inhaler Inhale 2 puffs into the lungs every 4 (four) hours as needed for wheezing or shortness of breath. 11/21/23   Marylu Gee, DO  apixaban  (ELIQUIS ) 5 MG TABS tablet Take 1 tablet (5 mg total) by mouth 2 (two) times daily. 11/17/23   [name], [name], DO  atorvastatin  (LIPITOR ) 40 MG tablet Take 1 tablet (40 mg total) by mouth daily. 10/10/23   [name], [name], DO  diltiazem  (CARDIZEM  CD) 120 MG 24 hr capsule Take 1 capsule (  120 mg total) by mouth daily. 12/16/23   [name], [name], DO  Dupilumab  (DUPIXENT ) 300 MG/2ML SOAJ Inject 300 mg into the skin every 14 (fourteen) days. 11/14/23   Hunsucker, [name], MD  fluticasone  (FLONASE ) 50 MCG/ACT nasal spray Place 1 spray into both nostrils daily. 11/28/23   [name], [name], DO  Fluticasone -Umeclidin-Vilant (TRELEGY ELLIPTA ) 100-62.5-25 MCG/ACT AEPB Inhale 1 puff into the lungs daily. 11/21/23 01/22/24  Marylu Gee, DO  loratadine  (CLARITIN ) 10 MG tablet Take 1 tablet (10 mg total) by mouth daily. 11/28/23 11/27/24  [name], [name], DO  losartan  (COZAAR ) 50 MG tablet Take 1 tablet (50 mg total) by mouth daily. 11/23/23   Patwardhan, Newman PARAS, MD  metoprolol  tartrate (LOPRESSOR ) 25 MG tablet Take 1 tablet (25 mg total) by mouth 2 (two) times daily. 10/17/23   [name], [name], DO  nicotine  (NICODERM CQ  - DOSED IN MG/24 HOURS) 14 mg/24hr patch Place 1 patch (14 mg total) onto the skin daily. 10/10/23   [name], [name], DO  nitroGLYCERIN  (NITROSTAT ) 0.4 MG SL tablet Place 1 tablet (0.4 mg total) under the tongue every 5 (five) minutes x 3 doses as needed for chest pain. 11/17/23   [name], [name], DO  thiamine  (VITAMIN B-1) 100 MG tablet Take 1 tablet (100 mg total) by mouth daily. 08/15/23   Krishnan, Gokul, MD                                                                                                                                    Allergies Ivp dye [iodinated contrast media], Tramadol, and Doxycycline   Review of Systems Review of Systems As noted in HPI  Physical Exam Vital Signs  I have reviewed the triage vital signs BP (!) 177/98   Pulse 87   Temp (!) 97.3 F (36.3 C) (Temporal)   Resp (!) 31   Ht 6' (1.829 m)   Wt 83.9 kg   SpO2 91%   BMI 25.09 kg/m   Physical Exam Vitals reviewed.  Constitutional:      General: He is not in acute distress.    Appearance: He is well-developed. He is not diaphoretic.  HENT:     Head: Normocephalic and atraumatic.     Nose: Nose normal.  Eyes:     General: No scleral icterus.       Right eye: No discharge.        Left eye: No discharge.     Conjunctiva/sclera: Conjunctivae normal.     Pupils: Pupils are equal, round, and reactive to light.  Cardiovascular:     Rate and Rhythm: Normal rate and regular rhythm.     Heart sounds: No murmur heard.    No friction rub. No gallop.  Pulmonary:     Effort: Tachypnea and respiratory distress  present.     Breath sounds: Normal breath sounds. Decreased air movement present. No stridor.     Comments: Breathing with pursed lips  Abdominal:     General: There is no distension.     Palpations: Abdomen is soft.     Tenderness: There is no abdominal tenderness.  Musculoskeletal:        General: No tenderness.     Cervical back: Normal range of motion and neck supple.  Skin:    General: Skin is warm and dry.     Findings: No erythema or rash.  Neurological:     Mental Status: He is alert and oriented to person, place, and time.     ED Results and Treatments Labs (all labs ordered are listed, but only abnormal results are displayed) Labs Reviewed  CBC - Abnormal; Notable for the following components:      Result Value   WBC 11.6 (*)    All other components within normal limits  BRAIN NATRIURETIC PEPTIDE - Abnormal; Notable for the following components:   B Natriuretic Peptide 109.8 (*)    All other components within normal limits  COMPREHENSIVE METABOLIC PANEL WITH GFR - Abnormal; Notable for the following components:   Glucose, Bld 190 (*)    Albumin  3.4 (*)    AST 55 (*)    ALT 158 (*)    All other components within normal limits  I-STAT CHEM 8, ED - Abnormal; Notable for the following components:   Glucose, Bld 124 (*)    TCO2 35 (*)    All other components within normal limits  I-STAT VENOUS BLOOD GAS, ED - Abnormal; Notable for the following components:   pCO2, Ven 77.0 (*)    Bicarbonate 38.3 (*)    TCO2 41 (*)    Acid-Base Excess 8.0 (*)    All other components within normal limits  RESP PANEL BY RT-PCR (RSV, FLU A&B, COVID)  [id number]                                                                                                                         EKG  EKG Interpretation Date/Time:  Sunday December 25 2023 01:26:04 EDT Ventricular Rate:  90 PR Interval:  171 QRS Duration:  78 QT Interval:  387 QTC Calculation: 474 R Axis:   55  Text  Interpretation: Sinus rhythm Consider right atrial enlargement Minimal ST depression, anterolateral leads Baseline wander in lead(s) II aVF No significant change was found Confirmed by Trine Likes [phone]) on 12/25/2023 1:44:28 AM       Radiology DG Chest Port 1 View Result Date: 12/25/2023 CLINICAL DATA:  Shortness of breath EXAM: PORTABLE CHEST 1 VIEW COMPARISON:  10/30/2023 FINDINGS: The heart size and mediastinal contours are within normal limits. Both lungs are clear. The visualized skeletal structures are unremarkable. IMPRESSION: No active disease. Electronically Signed   By: Franky Crease M.D.   On: 12/25/2023 01:40    Medications Ordered in ED Medications  AeroChamber Plus Flo-Vu Large MISC 1 each (has no administration in time range)  ipratropium-albuterol  (DUONEB) 0.5-2.5 (3) MG/3ML nebulizer solution 3 mL (3 mLs  Nebulization Given 12/25/23 0134)  albuterol  (VENTOLIN  HFA) 108 (90 Base) MCG/ACT inhaler 2 puff (2 puffs Inhalation Given 12/25/23 0349)   Procedures Procedures  (including critical care time) Medical Decision Making / ED Course   Medical Decision Making Amount and/or Complexity of Data Reviewed Labs: ordered. Decision-making details documented in ED Course. Radiology: ordered and independent interpretation performed. Decision-making details documented in ED Course. ECG/medicine tests: ordered and independent interpretation performed. Decision-making details documented in ED Course.  Risk Prescription drug management.    Shortness of breath differential diagnosis considered  Favoring COPD exacerbation.  Will provide patient with additional DuoNeb. Will also assess for CHF exacerbation, pneumonia, pneumothorax, pulmonary edema or pleural effusions.  Chest x-ray without evidence of pneumonia, pneumothorax, pulmonary edema pleural effusion. CBC with mild leukocytosis.  No anemia. No electrolyte derangements or renal sufficiency. VBG notable for compensated  respiratory acidosis, likely chronic Workup is nonconcerning for CHF exacerbation.  On reevaluation after third DuoNeb, patient reported feeling much better.  States that he is at his baseline.  Ambulated and did become short of breath.  States that this is his baseline.  Given 2 puffs of albuterol .  Continues to feel well.  Offered admission for continued treatment but patient declined stating again that this is his baseline respiratory status.     Final Clinical Impression(s) / ED Diagnoses Final diagnoses:  COPD exacerbation (HCC)   The patient appears reasonably screened and/or stabilized for discharge and I doubt any other medical condition or other [facility] requiring further screening, evaluation, or treatment in the ED at this time. I have discussed the findings, Dx and Tx plan with the patient/family who expressed understanding and agree(s) with the plan. Discharge instructions discussed at length. The patient/family was given strict return precautions who verbalized understanding of the instructions. No further questions at time of discharge.  Disposition: Discharge  Condition: Good  ED Discharge Orders          Ordered    predniSONE  (DELTASONE ) 20 MG tablet  Daily with breakfast        12/25/23 0451           Follow Up: Primary care provider  Call  to schedule an appointment for close follow up    This chart was dictated using voice recognition software.  Despite best efforts to proofread,  errors can occur which can change the documentation meaning.    Trine Raynell Moder, MD 12/25/23 [phone]

## 2023-12-25 NOTE — ED Notes (Signed)
 Ambulated pt on room air 167ft around department - pt desatted to 85% and reported SOB with exertion. Pt reports needing his inhaler and this is how it always goes, I'll feel better after my inhaler. MD and RT at bedside to evaluate pt.

## 2023-12-25 NOTE — ED Triage Notes (Addendum)
 Pt BIB [facility] from home. About 45 min PTA pt woke up with [facility], has been feeling like this x2 days, 82% RA. EMS gave 2 duoneb, 125 solumedrol, and 2grams magnesium ., EMS reports diminished lung sounds. Hx COPD. Pt also reports coughing up clear phlegm the past few days. No O2 at baseline  EMS 186/105 BP 91P 98% 8L 53 cap 20LAC

## 2023-12-26 ENCOUNTER — Other Ambulatory Visit: Payer: Self-pay | Admitting: Student

## 2023-12-26 ENCOUNTER — Other Ambulatory Visit (HOSPITAL_COMMUNITY): Payer: Self-pay

## 2023-12-26 MED ORDER — METOPROLOL TARTRATE 25 MG PO TABS
25.0000 mg | ORAL_TABLET | Freq: Two times a day (BID) | ORAL | 1 refills | Status: DC
  Filled 2023-12-26 – 2024-01-06 (×2): qty 60, 30d supply, fill #0

## 2023-12-26 NOTE — Telephone Encounter (Signed)
 Medication sent to pharmacy

## 2023-12-28 ENCOUNTER — Other Ambulatory Visit: Payer: Self-pay | Admitting: Pharmacy Technician

## 2023-12-28 ENCOUNTER — Other Ambulatory Visit: Payer: Self-pay

## 2023-12-28 NOTE — Progress Notes (Signed)
 Specialty Pharmacy Refill Coordination Note  Francisco [name] is a 63 y.o. male contacted today regarding refills of specialty medication(s) Dupilumab  (Dupixent )   Patient requested Delivery   Delivery date: 01/04/24   Verified address: [address]  [place]   Medication will be filled on 01/03/24.

## 2023-12-30 DIAGNOSIS — Z419 Encounter for procedure for purposes other than remedying health state, unspecified: Secondary | ICD-10-CM | POA: Diagnosis not present

## 2024-01-03 ENCOUNTER — Other Ambulatory Visit: Payer: Self-pay

## 2024-01-04 ENCOUNTER — Other Ambulatory Visit (HOSPITAL_COMMUNITY): Payer: Self-pay

## 2024-01-06 ENCOUNTER — Other Ambulatory Visit: Payer: Self-pay | Admitting: Student

## 2024-01-06 ENCOUNTER — Telehealth: Payer: Self-pay | Admitting: *Deleted

## 2024-01-06 ENCOUNTER — Other Ambulatory Visit (HOSPITAL_COMMUNITY): Payer: Self-pay

## 2024-01-06 NOTE — Telephone Encounter (Signed)
 Copied from CRM [id number]. Topic: Clinical - Medication Question >> Jan 06, 2024  8:31 AM Farrel B wrote: Reason for CRM: Patient stated he needed his medication changed on the nebulizer, the pharmacy advised to duoneb. Please call patient to advise  Rad, [place] (Self) [phone]

## 2024-01-09 ENCOUNTER — Other Ambulatory Visit: Payer: Self-pay | Admitting: Student

## 2024-01-09 ENCOUNTER — Other Ambulatory Visit (HOSPITAL_COMMUNITY): Payer: Self-pay

## 2024-01-09 ENCOUNTER — Other Ambulatory Visit: Payer: Self-pay

## 2024-01-09 MED ORDER — ALBUTEROL SULFATE HFA 108 (90 BASE) MCG/ACT IN AERS
2.0000 | INHALATION_SPRAY | RESPIRATORY_TRACT | 2 refills | Status: DC | PRN
  Filled 2024-01-09: qty 18, 17d supply, fill #0
  Filled 2024-01-23: qty 18, 17d supply, fill #1

## 2024-01-09 MED ORDER — ALBUTEROL SULFATE (2.5 MG/3ML) 0.083% IN NEBU
2.5000 mg | INHALATION_SOLUTION | Freq: Four times a day (QID) | RESPIRATORY_TRACT | 2 refills | Status: DC | PRN
  Filled 2024-01-09: qty 300, 25d supply, fill #0

## 2024-01-09 NOTE — Telephone Encounter (Unsigned)
 Copied from CRM #[id number]. Topic: Clinical - Medication Refill >> Jan 09, 2024 10:14 AM Vivian Z wrote: Medication: albuterol  (VENTOLIN  HFA) 108 (90 Base) MCG/ACT inhaler  Has the patient contacted their pharmacy? Yes (Agent: If no, request that the patient contact the pharmacy for the refill. If patient does not wish to contact the pharmacy document the reason why and proceed with request.) (Agent: If yes, when and what did the pharmacy advise?)  This is the patient's preferred pharmacy:  [facility] [address], [address] [place] [address] Phone: [phone] Fax: [phone] Is this the correct pharmacy for this prescription? Yes If no, delete pharmacy and type the correct one.   Has the prescription been filled recently? No  Is the patient out of the medication? Yes  Has the patient been seen for an appointment in the last year OR does the patient have an upcoming appointment? Yes  Can we respond through MyChart? Yes  Agent: Please be advised that Rx refills may take up to 3 business days. We ask that you follow-up with your pharmacy.

## 2024-01-11 ENCOUNTER — Ambulatory Visit: Payer: Self-pay | Admitting: Pulmonary Disease

## 2024-01-11 NOTE — Telephone Encounter (Signed)
 FYI Only or Action Required?: Action required by provider: clinical question for provider.  Patient is followed in Pulmonology for COPD, last seen on 12/07/2023 by Hunsucker, [name], MD.  Called Nurse Triage reporting Bleeding/Bruising.  Symptoms began today.  Interventions attempted: Nothing.  Symptoms are: unchanged.  Triage Disposition: Call PCP When Office is Open  Patient/caregiver understands and will follow disposition?: Yes      Copied from CRM #[id number]. Topic: Clinical - Red Word Triage >> Jan 11, 2024 10:37 AM Francisco [name] wrote: Red Word that prompted transfer to Nurse Triage: Patient took his Dupixent  shot on Monday and today has a big, bright red bruise at the shot area. Not painful        Reason for Disposition  Minor bruising at site of heparin  injection (e.g., heparin , Fragmin, Innohep, Lovenox )  Answer Assessment - Initial Assessment Questions Patient reports bruising at his Dupixent  injections site. He denies any pain or other symptoms at this time. Patient advised to monitor area and call back for any new or worsening symptoms. Patient verbalized understanding and agreement with this plan.     1. APPEARANCE of BRUISE: Describe the bruise.      Purple bruise  2. SIZE: How large is the bruise?      Small saucer plate  3. NUMBER: How many bruises are there?      One  4. LOCATION: Where is the bruise located?      Abdomen 5. ONSET: How long ago did the bruise occur?      Noticed today  6. CAUSE: What do you think caused the bruise?     Same area as his Dupixent  shot 2 days ago 7. MEDICAL HISTORY: Do you have any medical problems that can cause easy bruising or bleeding? (e.g., leukemia, liver disease, recent chemotherapy)     No 8. MEDICINES: Do you take any medicines which thin the blood such as: aspirin , apixaban , heparin , ibuprofen  (NSAIDS), Plavix , or Coumadin?     Eliquis   9. OTHER SYMPTOMS: Do you have any other symptoms?   (e.g., weakness, dizziness, pain, fever, nosebleed, blood in urine/stool)     No  Protocols used: Bruises-A-AH

## 2024-01-11 NOTE — Telephone Encounter (Signed)
 FYI

## 2024-01-17 ENCOUNTER — Other Ambulatory Visit (HOSPITAL_COMMUNITY): Payer: Self-pay

## 2024-01-17 ENCOUNTER — Other Ambulatory Visit: Payer: Self-pay | Admitting: Student

## 2024-01-17 MED ORDER — ATORVASTATIN CALCIUM 40 MG PO TABS
40.0000 mg | ORAL_TABLET | Freq: Every day | ORAL | 1 refills | Status: DC
  Filled 2024-01-17: qty 90, 90d supply, fill #0

## 2024-01-17 NOTE — Telephone Encounter (Signed)
 Medication sent to pharmacy

## 2024-01-19 ENCOUNTER — Other Ambulatory Visit (HOSPITAL_COMMUNITY): Payer: Self-pay

## 2024-01-19 ENCOUNTER — Ambulatory Visit: Admitting: Cardiology

## 2024-01-21 ENCOUNTER — Emergency Department (HOSPITAL_COMMUNITY)
Admission: EM | Admit: 2024-01-21 | Discharge: 2024-01-21 | Disposition: A | Discharge status: Home / Self Care | Attending: Emergency Medicine | Admitting: Emergency Medicine

## 2024-01-21 ENCOUNTER — Other Ambulatory Visit: Payer: Self-pay

## 2024-01-21 ENCOUNTER — Encounter (HOSPITAL_COMMUNITY): Payer: Self-pay

## 2024-01-21 ENCOUNTER — Emergency Department (HOSPITAL_COMMUNITY)

## 2024-01-21 DIAGNOSIS — Z7951 Long term (current) use of inhaled steroids: Secondary | ICD-10-CM | POA: Insufficient documentation

## 2024-01-21 DIAGNOSIS — Z79899 Other long term (current) drug therapy: Secondary | ICD-10-CM | POA: Diagnosis not present

## 2024-01-21 DIAGNOSIS — J449 Chronic obstructive pulmonary disease, unspecified: Secondary | ICD-10-CM | POA: Diagnosis not present

## 2024-01-21 DIAGNOSIS — Z7901 Long term (current) use of anticoagulants: Secondary | ICD-10-CM | POA: Insufficient documentation

## 2024-01-21 DIAGNOSIS — I110 Hypertensive heart disease with heart failure: Secondary | ICD-10-CM | POA: Diagnosis not present

## 2024-01-21 DIAGNOSIS — I2510 Atherosclerotic heart disease of native coronary artery without angina pectoris: Secondary | ICD-10-CM | POA: Insufficient documentation

## 2024-01-21 DIAGNOSIS — I482 Chronic atrial fibrillation, unspecified: Secondary | ICD-10-CM | POA: Diagnosis not present

## 2024-01-21 DIAGNOSIS — I509 Heart failure, unspecified: Secondary | ICD-10-CM | POA: Insufficient documentation

## 2024-01-21 DIAGNOSIS — J441 Chronic obstructive pulmonary disease with (acute) exacerbation: Secondary | ICD-10-CM | POA: Diagnosis not present

## 2024-01-21 DIAGNOSIS — R7401 Elevation of levels of liver transaminase levels: Secondary | ICD-10-CM | POA: Insufficient documentation

## 2024-01-21 DIAGNOSIS — R0600 Dyspnea, unspecified: Secondary | ICD-10-CM | POA: Diagnosis not present

## 2024-01-21 DIAGNOSIS — R0602 Shortness of breath: Secondary | ICD-10-CM | POA: Diagnosis not present

## 2024-01-21 LAB — CBC
HCT: 47.4 % (ref 39.0–52.0)
Hemoglobin: 15.6 g/dL (ref 13.0–17.0)
MCH: 31.5 pg (ref 26.0–34.0)
MCHC: 32.9 g/dL (ref 30.0–36.0)
MCV: 95.6 fL (ref 80.0–100.0)
Platelets: 312 K/uL (ref 150–400)
RBC: 4.96 MIL/uL (ref 4.22–5.81)
RDW: 13.2 % (ref 11.5–15.5)
WBC: 9.8 K/uL (ref 4.0–10.5)
nRBC: 0 % (ref 0.0–0.2)

## 2024-01-21 LAB — COMPREHENSIVE METABOLIC PANEL WITH GFR
ALT: 220 U/L — ABNORMAL HIGH (ref 0–44)
AST: 101 U/L — ABNORMAL HIGH (ref 15–41)
Albumin: 3.7 g/dL (ref 3.5–5.0)
Alkaline Phosphatase: 96 U/L (ref 38–126)
Anion gap: 9 (ref 5–15)
BUN: 10 mg/dL (ref 8–23)
CO2: 27 mmol/L (ref 22–32)
Calcium: 8.8 mg/dL — ABNORMAL LOW (ref 8.9–10.3)
Chloride: 103 mmol/L (ref 98–111)
Creatinine, Ser: 1.01 mg/dL (ref 0.61–1.24)
GFR, Estimated: >60 mL/min (ref >60)
Glucose, Bld: 108 mg/dL — ABNORMAL HIGH (ref 70–99)
Potassium: 4.2 mmol/L (ref 3.5–5.1)
Sodium: 139 mmol/L (ref 135–145)
Total Bilirubin: 1.1 mg/dL (ref 0.0–1.2)
Total Protein: 7.1 g/dL (ref 6.5–8.1)

## 2024-01-21 MED ORDER — PREDNISONE 10 MG (21) PO TBPK: ORAL_TABLET | Freq: Every day | ORAL | 0 refills | Status: DC

## 2024-01-21 MED ORDER — ALBUTEROL (5 MG/ML) CONTINUOUS INHALATION SOLN
10.0000 mg/h | INHALATION_SOLUTION | Freq: Once | RESPIRATORY_TRACT | Status: AC
  Administered 2024-01-21: 10 mg/h via RESPIRATORY_TRACT
  Filled 2024-01-21: qty 20

## 2024-01-21 NOTE — ED Triage Notes (Signed)
 Patient BIB EMS from home c/o SOB. Patient has been having SOB for a few days, with it being worse the past 2 days. Patient does have history of COPD. Patient states his home pulse ox was reading 82% on RA. EMS gave 0.3 of epi, 2g of mag, 125 of solumedrol, 10 of albuterol , and 1 of atrivent. No O2 at baseline.

## 2024-01-21 NOTE — ED Provider Notes (Signed)
 [place] [facility] DE[facility]MENT AT [facility] Provider Note   CSN: [id number] Arrival date & time: 01/21/24  0845     Patient presents with: Shortness of Breath   Francisco [name] is a 63 y.o. male.   HPI   63 year old male history of COPD, tobacco abuse ongoing, hypertension, CHF, CAD status post angioplasty, pulmonary hypertension, paroxysmal A-fib presents today complaining of dyspnea.  Patient is brought in from EMS.  He has been getting more short of breath over the past couple of days.  He describes increasing wheezing and dyspnea.  Patient is not normally on home oxygen .  He denies runny nose, fever, productive cough.  EMS reported he has tripod position, sats of 82% and increased work of breathing on presentation.  Patient was started on nonrebreather, given epi, magnesium , Solu-Medrol , DuoNeb x 2 and albuterol .  Patient has had some decreased wheezing, decreased distress, and improvement in oxygenation on the nonrebreather. Reviewed last pulmonary visit 12/07/23- Patient on dupixent  for 4 to 6 weeks now Patient continues smoke  Prior to Admission medications   Medication Sig Start Date End Date Taking? Authorizing Provider  predniSONE  (STERAPRED UNI-PAK 21 TAB) 10 MG (21) TBPK tablet Take by mouth daily. Take 6 tabs by mouth daily  for 2 days, then 5 tabs for 2 days, then 4 tabs for 2 days, then 3 tabs for 2 days, 2 tabs for 2 days, then 1 tab by mouth daily for 2 days 01/21/24  Yes Levander Houston, MD  albuterol  (PROVENTIL ) (2.5 MG/3ML) 0.083% nebulizer solution Use 1 vial (2.5 mg total) by nebulization every 6 (six) hours as needed for wheezing or shortness of breath. 01/09/24   [name], [name], DO  albuterol  (VENTOLIN  HFA) 108 (90 Base) MCG/ACT inhaler Inhale 2 puffs into the lungs every 4 (four) hours as needed for wheezing or shortness of breath. 01/09/24   [name], [name], DO  apixaban  (ELIQUIS ) 5 MG TABS tablet Take 1 tablet (5 mg total) by mouth 2 (two) times daily.  11/17/23   [name], [name], DO  atorvastatin  (LIPITOR ) 40 MG tablet Take 1 tablet (40 mg total) by mouth daily. 01/17/24   [name], [name], DO  diltiazem  (CARDIZEM  CD) 120 MG 24 hr capsule Take 1 capsule (120 mg total) by mouth daily. 12/16/23   [name], [name], DO  Dupilumab  (DUPIXENT ) 300 MG/2ML SOAJ Inject 300 mg into the skin every 14 (fourteen) days. 11/14/23   Hunsucker, [name], MD  fluticasone  (FLONASE ) 50 MCG/ACT nasal spray Place 1 spray into both nostrils daily. 11/28/23   [name], [name], DO  Fluticasone -Umeclidin-Vilant (TRELEGY ELLIPTA ) 100-62.5-25 MCG/ACT AEPB Inhale 1 puff into the lungs daily. 11/21/23 01/22/24  Marylu Gee, DO  loratadine  (CLARITIN ) 10 MG tablet Take 1 tablet (10 mg total) by mouth daily. 11/28/23 11/27/24  [name], [name], DO  losartan  (COZAAR ) 50 MG tablet Take 1 tablet (50 mg total) by mouth daily. 11/23/23   Patwardhan, Newman PARAS, MD  metoprolol  tartrate (LOPRESSOR ) 25 MG tablet Take 1 tablet (25 mg total) by mouth 2 (two) times daily. 12/26/23   [name], [name], DO  nicotine  (NICODERM CQ  - DOSED IN MG/24 HOURS) 14 mg/24hr patch Place 1 patch (14 mg total) onto the skin daily. 10/10/23   [name], [name], DO  nitroGLYCERIN  (NITROSTAT ) 0.4 MG SL tablet Place 1 tablet (0.4 mg total) under the tongue every 5 (five) minutes x 3 doses as needed for chest pain. 11/17/23   [name], [name], DO  thiamine  (VITAMIN B-1) 100 MG tablet Take 1 tablet (100 mg  total) by mouth daily. 08/15/23   Krishnan, Gokul, MD    Allergies: Ivp dye [iodinated contrast media], Tramadol, and Doxycycline     Review of Systems  Updated Vital Signs BP (!) 168/95   Pulse 95   Temp 97.7 F (36.5 C)   Resp (!) 22   SpO2 94%   Physical Exam Vitals reviewed.  Constitutional:      General: He is in acute distress.     Appearance: He is ill-appearing.  HENT:     Head: Normocephalic.     Mouth/Throat:     Mouth: Mucous membranes are moist.  Eyes:     Pupils: Pupils are equal, round, and  reactive to light.  Cardiovascular:     Rate and Rhythm: Tachycardia present.  Pulmonary:     Effort: Tachypnea present.     Breath sounds: Decreased breath sounds present.  Musculoskeletal:        General: Normal range of motion.     Cervical back: Normal range of motion.  Skin:    General: Skin is warm.     Capillary Refill: Capillary refill takes less than 2 seconds.  Neurological:     General: No focal deficit present.     Mental Status: He is alert.  Psychiatric:        Mood and Affect: Mood normal.     (all labs ordered are listed, but only abnormal results are displayed) Labs Reviewed  COMPREHENSIVE METABOLIC PANEL WITH GFR - Abnormal; Notable for the following components:      Result Value   Glucose, Bld 108 (*)    Calcium  8.8 (*)    AST 101 (*)    ALT 220 (*)    All other components within normal limits  RESP PANEL BY RT-PCR (RSV, FLU A&B, COVID)  [id number]  CBC    EKG: EKG Interpretation Date/Time:  Saturday January 21 2024 08:52:59 EDT Ventricular Rate:  81 PR Interval:  156 QRS Duration:  80 QT Interval:  391 QTC Calculation: 454 R Axis:   2  Text Interpretation: Sinus rhythm No acute changes Confirmed by Levander Houston [phone]) on 01/21/2024 11:05:53 AM  Radiology: [facility] Chest Port 1 View Result Date: 01/21/2024 CLINICAL DATA:  COPD. EXAM: PORTABLE CHEST 1 VIEW COMPARISON:  12/25/2023 FINDINGS: Lungs are hyperexpanded. The lungs are clear without focal pneumonia, edema, pneumothorax or pleural effusion. The cardiopericardial silhouette is within normal limits for size. Telemetry leads overlie the chest. IMPRESSION: Hyperexpansion without acute cardiopulmonary findings. Electronically Signed   By: Camellia Candle M.D.   On: 01/21/2024 09:28     Procedures   Medications Ordered in the ED  albuterol  (PROVENTIL ,VENTOLIN ) solution continuous neb (10 mg/hr Nebulization Given 01/21/24 0933)    Clinical Course as of 01/21/24 1224  Sat Jan 21, 2024  1037 Chest x-Jory Welke  reviewed interpreted no evidence of acute abnormality is noted on my interpretation radiologist interpretation notes hyperexpansion without acute cardiopulmonary findings [DR]  1105 Patient reevaluated with improved air movement no definite wheezing noted.  He feels improved at this time but is still on continuous neb. Blood pressure was initially 200/109 decreased to 168/101 [DR]  1215 Patient reevaluated.  He is very anxious to leave at this time.  He continues to had poor air movement. [DR]  1215 Blood pressure remains elevated at 168. [DR]  1216 He reports taking his medications.  Likely the blood pressure is elevated with continuous nebulizers and epinephrine  that was given in the field. Oxygen  has been discontinued at  this time to evaluate patient's oxygenation as he wishes to leave at this time. [DR]    Clinical Course User Index [DR] Levander Houston, MD                                 Medical Decision Making Amount and/or Complexity of Data Reviewed Labs: ordered. Radiology: ordered.   63 year old male with COPD, CHF, hypertension, CAD, paroxysmal atrial fibrillation presents today complaining of increased dyspnea. Differential diagnosis includes but is not limited to COPD D exacerbation, viral infections, acute bacterial infections, CHF, arrhythmias, CAD Patient evaluated here with chest x-Ranay Ketter which shows no definitive infiltrates or evidence of pneumothorax CBC is normal Complete metabolic panel is significant for elevated AST and ALT at 101 and 220.  These have been elevated on several recent visits.  Patient is aware and states he has been sent to GI for follow-up. Patient has improved here.  Oxygen  has been removed and oxygen  saturations are 92 to 93% on room air. Patient is very anxious to leave.  He is advised to stay for further observation but wishes to go.  Will give prescription for steroid taper.  Patient advised regarding return precautions and need for close follow-up      Final diagnoses:  COPD exacerbation [facility])    ED Discharge Orders          Ordered    predniSONE  (STERAPRED UNI-PAK 21 TAB) 10 MG (21) TBPK tablet  Daily        01/21/24 1224               Levander Houston, MD 01/21/24 1224

## 2024-01-21 NOTE — Discharge Instructions (Signed)
 Please pick up steroids today from [facility] pharmacy.  Your usual pharmacy is not open on Saturday. Please take as prescribed Please continue all your inhalers at home. Return immediately if you are starting to have more trouble breathing or began wheezing again or have other new symptoms keep your appointments for follow-up with your pulmonologist as scheduled on Monday

## 2024-01-21 NOTE — ED Notes (Signed)
 CCM called for tele admit.

## 2024-01-23 ENCOUNTER — Telehealth (HOSPITAL_COMMUNITY): Payer: Self-pay

## 2024-01-23 ENCOUNTER — Ambulatory Visit: Admitting: Pulmonary Disease

## 2024-01-23 ENCOUNTER — Telehealth: Payer: Self-pay

## 2024-01-23 ENCOUNTER — Encounter: Payer: Self-pay | Admitting: Pulmonary Disease

## 2024-01-23 ENCOUNTER — Other Ambulatory Visit: Payer: Self-pay

## 2024-01-23 ENCOUNTER — Other Ambulatory Visit (HOSPITAL_COMMUNITY): Payer: Self-pay

## 2024-01-23 DIAGNOSIS — J441 Chronic obstructive pulmonary disease with (acute) exacerbation: Secondary | ICD-10-CM | POA: Diagnosis not present

## 2024-01-23 DIAGNOSIS — J45901 Unspecified asthma with (acute) exacerbation: Secondary | ICD-10-CM | POA: Diagnosis not present

## 2024-01-23 DIAGNOSIS — R0609 Other forms of dyspnea: Secondary | ICD-10-CM

## 2024-01-23 DIAGNOSIS — J449 Chronic obstructive pulmonary disease, unspecified: Secondary | ICD-10-CM

## 2024-01-23 MED ORDER — IPRATROPIUM-ALBUTEROL 0.5-2.5 (3) MG/3ML IN SOLN
3.0000 mL | Freq: Four times a day (QID) | RESPIRATORY_TRACT | 6 refills | Status: DC | PRN
  Filled 2024-01-23: qty 360, 30d supply, fill #0
  Filled 2024-01-24: qty 360, 30d supply, fill #1

## 2024-01-23 MED ORDER — DUPIXENT 300 MG/2ML ~~LOC~~ SOAJ
300.0000 mg | SUBCUTANEOUS | 6 refills | Status: DC
  Filled 2024-01-23 – 2024-01-26 (×3): qty 2, 14d supply, fill #0

## 2024-01-23 MED ORDER — PREDNISONE 20 MG PO TABS
ORAL_TABLET | ORAL | 0 refills | Status: DC
  Filled 2024-01-23: qty 15, 10d supply, fill #0

## 2024-01-23 MED ORDER — ALBUTEROL-BUDESONIDE 90-80 MCG/ACT IN AERO
2.0000 | INHALATION_SPRAY | RESPIRATORY_TRACT | 12 refills | Status: AC | PRN
  Filled 2024-01-23: qty 10.7, 17d supply, fill #0
  Filled 2024-01-26 – 2024-02-08 (×2): qty 10.7, 30d supply, fill #0
  Filled 2024-02-28: qty 10.7, 30d supply, fill #1

## 2024-01-23 NOTE — Addendum Note (Signed)
 Addended byBETHA ANNELLA COUGH on: 01/23/2024 11:22 AM   Modules accepted: Level of Service

## 2024-01-23 NOTE — Patient Instructions (Signed)
 Nice to see you again  Use DuoNebs, nebulizer as needed 4 times a day and to feel better then decrease to every 6 hours as needed  Use Airsupra, new rescue inhaler that adds a steroid.  Every 4 hours as needed.  I removed albuterol  nebulizer and albuterol  inhaler from your medication list as these medications replace them  Continue using the Trelegy  Will continue Dupixent  for another 3 months, if not improving then we will need to discuss using prednisone  on a daily basis  I sent a prednisone  taper for your cough and shortness of breath that is worse over the last couple weeks  Return to clinic in 3 months or sooner if needed with Dr. Annella

## 2024-01-23 NOTE — Telephone Encounter (Signed)
*  Pulm  Pharmacy Patient Advocate Encounter   Received notification from Pt Calls Messages that prior authorization for Francisco [name] is required/requested.   Insurance verification completed.   The patient is insured through [facility].   Per test claim: PA required; PA submitted to above mentioned insurance via Latent Key/confirmation #/EOC [id number] Status is pending

## 2024-01-23 NOTE — Progress Notes (Signed)
 @Patient  ID: Francisco [name], male    DOB: 1960/05/27, 63 y.o.   MRN: [id number]  No chief complaint on file.   Referring provider: Heddy Barren, DO  HPI:   63 y.o. man with very severe COPD whom are seen for evaluation of the same as well as dyspnea on exertion as well as hospital follow-up.  Discharge summary reviewed in interim.  ED note x2 reviewed in the interim.    Here for scheduled follow-up but also with recent ED visit over the weekend.  Now with nearly 3 months on Dupixent .  Symptoms not really any better.  Was seen in the ED 01/21/2024 for exacerbation of asthma.  Given Solu-Medrol  magnesium  and DuoNebs and route.  Improved.  Chest x-ray clear on my review interpretation.  Oxygen  saturation improved.  Sent home on room air.  Prescribed prednisone  taper.  He has not picked up the prednisone  taper.  We discussed disappointment and Dupixent  not improving things over 3 months.  We agreed to go another 3 months, 6 months total before passing final judgment.  We discussed only other alternative at this point would be daily prednisone  which he would like to avoid.  We discussed altering his rescue medications.  Albuterol  seems not effective anymore has not been very effective for the last 2 weeks of symptoms.  DuoNebs were effective via EMS.  DuoNebs prescribed today.  Airsupra prescribed for rescue HFA to replace albuterol  given ineffectiveness of albuterol .  We discussed continuing Trelegy.  He got his RSV and COVID-vaccine.  He is plan to get this flu vaccine later in the week.  HPI initial visit: Patient longstanding smoker.  Working in 2024.  Unfortunately in the late winter/spring 2025 developed worsening symptoms.  Worsening shortness of breath.  Chest congestion.  Recurrent visits to the ED.  Often treated for exacerbations of underlying presumed COPD.  Improved reliably with steroids antibiotics and nebulizer treatments.  So severe 07/2023 led to intubation.  He was on the ventilator  for a few days.  Subsequently improved.  Was seen by pulmonary Dr. Cole in clinic 08/2023.  PFTs reviewed.  This reveals very severe obstruction, air trapping, severely reduced DLCO.  Placed on Trelegy.  Since being on Trelegy has been out of the hospital for over a month now.  This seems to be an improvement.  However day-to-day symptoms seem unchanged.  We discussed his mild eosinophilia as possible reason for recent exacerbations, multiple ED visits.  We discussed being aggressive as possible to see to prevent this.  We discussed biologic therapy and he is eager to move forward with this.  He denies history of diagnosis of asthma.  He is still smoking.  Reviewed chest x-ray 1 view 05/2023 reveals clear lungs, mild hyperinflation.  Reviewed chest x-ray 2 view x-ray 25 reveals hyperinflation of the PA and the lateral clear lungs.  Reveal chest x-ray leading to prolonged hospitalization 08/09/2023 with clear lungs bilaterally.  Questionaires / Pulmonary Flowsheets:   ACT:      No data to display          MMRC:     No data to display          Epworth:      No data to display          Tests:   FENO:  No results found for: NITRICOXIDE  PFT:     No data to display          WALK:  No data to display          Imaging: Personally reviewed and as per EMR and discussion in this note DG Chest Port 1 View Result Date: 01/21/2024 CLINICAL DATA:  COPD. EXAM: PORTABLE CHEST 1 VIEW COMPARISON:  12/25/2023 FINDINGS: Lungs are hyperexpanded. The lungs are clear without focal pneumonia, edema, pneumothorax or pleural effusion. The cardiopericardial silhouette is within normal limits for size. Telemetry leads overlie the chest. IMPRESSION: Hyperexpansion without acute cardiopulmonary findings. Electronically Signed   By: Camellia Candle M.D.   On: 01/21/2024 09:28   DG Chest Port 1 View Result Date: 12/25/2023 CLINICAL DATA:  Shortness of breath EXAM: PORTABLE CHEST 1 VIEW  COMPARISON:  10/30/2023 FINDINGS: The heart size and mediastinal contours are within normal limits. Both lungs are clear. The visualized skeletal structures are unremarkable. IMPRESSION: No active disease. Electronically Signed   By: Franky Crease M.D.   On: 12/25/2023 01:40    Lab Results: Personally reviewed CBC    Component Value Date/Time   WBC 9.8 01/21/2024 0848   RBC 4.96 01/21/2024 0848   HGB 15.6 01/21/2024 0848   HGB 15.5 12/06/2023 1528   HCT 47.4 01/21/2024 0848   HCT 47.1 12/06/2023 1528   PLT 312 01/21/2024 0848   PLT 285 12/06/2023 1528   MCV 95.6 01/21/2024 0848   MCV 97 12/06/2023 1528   MCH 31.5 01/21/2024 0848   MCHC 32.9 01/21/2024 0848   RDW 13.2 01/21/2024 0848   RDW 13.6 12/06/2023 1528   LYMPHSABS 3.7 10/30/2023 0306   LYMPHSABS 1.7 05/18/2021 1518   MONOABS 1.5 (H) 10/30/2023 0306   EOSABS 0.2 10/30/2023 0306   EOSABS 0.0 05/18/2021 1518   BASOSABS 0.1 10/30/2023 0306   BASOSABS 0.0 05/18/2021 1518    BMET    Component Value Date/Time   NA 139 01/21/2024 0848   NA 144 12/06/2023 1528   K 4.2 01/21/2024 0848   CL 103 01/21/2024 0848   CO2 27 01/21/2024 0848   GLUCOSE 108 (H) 01/21/2024 0848   BUN 10 01/21/2024 0848   BUN 9 12/06/2023 1528   CREATININE 1.01 01/21/2024 0848   CALCIUM  8.8 (L) 01/21/2024 0848   GFRNONAA >60 01/21/2024 0848   GFRAA >60 01/11/2020 0422    BNP    Component Value Date/Time   BNP 109.8 (H) 12/25/2023 0126    ProBNP No results found for: PROBNP  Specialty Problems       Pulmonary Problems   COPD (chronic obstructive pulmonary disease) (HCC)   CAP (community acquired pneumonia)   Lung nodules   COPD exacerbation (HCC)   Acute hypoxic respiratory failure (HCC)    Allergies  Allergen Reactions   Ivp Dye [Iodinated Contrast Media] Other (See Comments)    Seizures    Tramadol Other (See Comments)    Seizures    Doxycycline  Other (See Comments)    Burning sensation to his hands.    Immunization  History  Administered Date(s) Administered   Influenza,inj,Quad PF,6+ Mos 06/17/2018   PNEUMOCOCCAL CONJUGATE-20 11/21/2023   Pneumococcal Polysaccharide-23 06/17/2018    Past Medical History:  Diagnosis Date   CHF (congestive heart failure) (HCC)    COPD (chronic obstructive pulmonary disease) (HCC)    COPD with acute exacerbation (HCC) 10/21/2021   Coronary artery disease    a. s/p prior PCI.   Hepatitis-C    Hypertension    IV drug abuse (HCC)    a. 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   Medically noncompliant  MI (myocardial infarction) (HCC)    Tobacco abuse     Tobacco History: Social History   Tobacco Use  Smoking Status Every Day   Current packs/day: 0.50   Types: Cigarettes   Passive exposure: Current  Smokeless Tobacco Never  Tobacco Comments   Half a pack a day as of 12/07/2023   Ready to quit: Not Answered Counseling given: Not Answered Tobacco comments: Half a pack a day as of 12/07/2023    Outpatient Encounter Medications as of 01/23/2024  Medication Sig   Albuterol -Budesonide  90-80 MCG/ACT AERO Inhale 2 puffs into the lungs every 4 (four) hours as needed (wheeze, shortness of breath).   apixaban  (ELIQUIS ) 5 MG TABS tablet Take 1 tablet (5 mg total) by mouth 2 (two) times daily.   atorvastatin  (LIPITOR ) 40 MG tablet Take 1 tablet (40 mg total) by mouth daily.   diltiazem  (CARDIZEM  CD) 120 MG 24 hr capsule Take 1 capsule (120 mg total) by mouth daily.   fluticasone  (FLONASE ) 50 MCG/ACT nasal spray Place 1 spray into both nostrils daily.   ipratropium-albuterol  (DUONEB) 0.5-2.5 (3) MG/3ML SOLN Take 3 mLs by nebulization every 6 (six) hours as needed.   loratadine  (CLARITIN ) 10 MG tablet Take 1 tablet (10 mg total) by mouth daily.   losartan  (COZAAR ) 50 MG tablet Take 1 tablet (50 mg total) by mouth daily.   metoprolol  tartrate (LOPRESSOR ) 25 MG tablet Take 1 tablet (25 mg total) by mouth 2 (two) times daily.   nicotine  (NICODERM CQ  - DOSED  IN MG/24 HOURS) 14 mg/24hr patch Place 1 patch (14 mg total) onto the skin daily.   nitroGLYCERIN  (NITROSTAT ) 0.4 MG SL tablet Place 1 tablet (0.4 mg total) under the tongue every 5 (five) minutes x 3 doses as needed for chest pain.   predniSONE  (DELTASONE ) 20 MG tablet Take 2 tablets (40 mg total) by mouth daily with breakfast for 5 days, THEN 1 tablet (20 mg total) daily with breakfast for 5 days.   thiamine  (VITAMIN B-1) 100 MG tablet Take 1 tablet (100 mg total) by mouth daily.   [DISCONTINUED] albuterol  (PROVENTIL ) (2.5 MG/3ML) 0.083% nebulizer solution Use 1 vial (2.5 mg total) by nebulization every 6 (six) hours as needed for wheezing or shortness of breath.   [DISCONTINUED] albuterol  (VENTOLIN  HFA) 108 (90 Base) MCG/ACT inhaler Inhale 2 puffs into the lungs every 4 (four) hours as needed for wheezing or shortness of breath.   [DISCONTINUED] Dupilumab  (DUPIXENT ) 300 MG/2ML SOAJ Inject 300 mg into the skin every 14 (fourteen) days.   [DISCONTINUED] predniSONE  (STERAPRED UNI-PAK 21 TAB) 10 MG (21) TBPK tablet Take by mouth daily. Take 6 tabs by mouth daily  for 2 days, then 5 tabs for 2 days, then 4 tabs for 2 days, then 3 tabs for 2 days, 2 tabs for 2 days, then 1 tab by mouth daily for 2 days   Dupilumab  (DUPIXENT ) 300 MG/2ML SOAJ Inject 300 mg into the skin every 14 (fourteen) days.   No facility-administered encounter medications on file as of 01/23/2024.     Review of Systems  Review of Systems  N/a Physical Exam  BP (!) 144/83   Pulse 75   Temp 98.2 F (36.8 C) (Oral)   Ht 6' (1.829 m)   Wt 195 lb 9.6 oz (88.7 kg)   SpO2 (!) 89%   BMI 26.53 kg/m   Wt Readings from Last 5 Encounters:  01/23/24 195 lb 9.6 oz (88.7 kg)  12/25/23 185 lb (83.9 kg)  12/07/23  183 lb 12.8 oz (83.4 kg)  12/06/23 183 lb 9.6 oz (83.3 kg)  11/23/23 186 lb 2 oz (84.4 kg)    BMI Readings from Last 5 Encounters:  01/23/24 26.53 kg/m  12/25/23 25.09 kg/m  12/07/23 24.93 kg/m  12/06/23 24.90  kg/m  11/23/23 25.24 kg/m     Physical Exam General: Sitting in chair, no acute distress Eyes: EOMI, no icterus Neck: Supple, no JVP Pulmonary: Clear, distant, faint end expiratory wheeze scattered bilaterally Cardiovascular: Warm, no edema Abdomen: Nondistended MSK: No synovitis, no joint effusion Neuro: Normal gait, no weakness Psych: Normal mood, full affect Skin: Left abdomen area of ecchymosis after Dupixent  injection late September 2025  Assessment & Plan:   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  With evidence of air trapping, severely restricted DLCO.  Appropriately on Trelegy given frequent exacerbations.  Eosinophil count of 300 2024.  Started Dupixent  11/2023 with some marginal improvement.   Unfortunate, no significant improvement in symptoms or exacerbations now with 3 months of use.  I have seen patients take up to 6 months to show significant improvement with Biologics.  He agreed to continue Dupixent  for a total of 6 months.  If no better we will discontinue at that time.  Only other alternative would be daily prednisone  which he would like to avoid.  Airsupra, DuoNebs to replace albuterol  for rescue medications, new prescription sent today.  Prednisone  taper sent today.  Recent chest x-ray 01/21/2024 clear, wheeze on exam, no antibiotics sent based on these findings.  Dyspnea on exertion: Likely multifactorial due to significant and severe COPD as well as cardiac etiologies.  Will tend to be as aggressive as possible from a pulmonary standpoint to see if we can improve this.  Unfortunately despite increasing medications, not a lot of improvement.  Encouraged ongoing cardiology follow-up.   Return in about 3 months (around 04/24/2024) for f/u Dr. Annella.   Donnice JONELLE Annella, MD 01/23/2024   I spent 41 minutes in the care of the patient including face-to-face visit, review of records, coordination of care.

## 2024-01-24 ENCOUNTER — Other Ambulatory Visit: Payer: Self-pay | Admitting: Student

## 2024-01-24 ENCOUNTER — Other Ambulatory Visit (HOSPITAL_COMMUNITY): Payer: Self-pay

## 2024-01-24 ENCOUNTER — Other Ambulatory Visit: Payer: Self-pay

## 2024-01-24 NOTE — Telephone Encounter (Signed)
 Pharmacy Patient Advocate Encounter  Received notification from [facility] that Prior Authorization for Airsupra 90-80MCG/ACT aerosol   has been DENIED.  Full denial letter will be uploaded to the media tab. See denial reason below.   Denied. The requested drug is not approved by the Food and Drug Administration (FDA) for the treatment of CHRONIC OBSTRUCTIVE PULMONARY DISEASE UNS in members 63 years of age or younger

## 2024-01-25 ENCOUNTER — Other Ambulatory Visit (HOSPITAL_COMMUNITY): Payer: Self-pay

## 2024-01-25 ENCOUNTER — Encounter (INDEPENDENT_AMBULATORY_CARE_PROVIDER_SITE_OTHER): Payer: Self-pay

## 2024-01-25 MED ORDER — FLUTICASONE PROPIONATE 50 MCG/ACT NA SUSP
1.0000 | Freq: Every day | NASAL | 0 refills | Status: AC
  Filled 2024-01-25: qty 16, 60d supply, fill #0

## 2024-01-25 MED ORDER — APIXABAN 5 MG PO TABS
5.0000 mg | ORAL_TABLET | Freq: Two times a day (BID) | ORAL | 2 refills | Status: DC
  Filled 2024-01-25: qty 60, 30d supply, fill #0

## 2024-01-25 NOTE — Telephone Encounter (Signed)
 Medication sent to pharmacy

## 2024-01-26 ENCOUNTER — Other Ambulatory Visit: Payer: Self-pay

## 2024-01-26 ENCOUNTER — Other Ambulatory Visit (HOSPITAL_COMMUNITY): Payer: Self-pay

## 2024-01-26 DIAGNOSIS — J449 Chronic obstructive pulmonary disease, unspecified: Secondary | ICD-10-CM

## 2024-01-26 MED ORDER — DUPIXENT 300 MG/2ML ~~LOC~~ SOAJ
300.0000 mg | SUBCUTANEOUS | 5 refills | Status: AC
  Filled 2024-01-26: qty 4, 28d supply, fill #0
  Filled 2024-03-06: qty 4, 28d supply, fill #1
  Filled 2024-03-27 – 2024-03-28 (×2): qty 4, 28d supply, fill #2

## 2024-01-26 NOTE — Telephone Encounter (Signed)
 Sent message to Francisco [name] for new rx. New rx that was sent in for a patient on 01/23/24, but the quantity is incorrect. They wrote for (14 day supply) instead of 4mls (28 day supply).

## 2024-01-26 NOTE — Progress Notes (Signed)
 Specialty Pharmacy Refill Coordination Note  MyChart Questionnaire Submission  Francisco [name] is a 63 y.o. male contacted today regarding refills of specialty medication(s) Dupixent .  Doses on hand: (Patient-Rptd) 0   Injection date: (Patient-Rptd) 02/06/24  Patient requested: (Patient-Rptd) Delivery   Delivery date: 01/31/24  Verified address: [address] [place] [address]  Medication will be filled on 01/30/24.

## 2024-01-26 NOTE — Progress Notes (Signed)
 Clinical Intervention Note  Clinical Intervention Notes: Patient reports initiating Duoneb, prednisone  and AirSupra. No DDIs identified with Dupixent .   Clinical Intervention Outcomes: Prevention of an adverse drug event   Francisco [name] Specialty Pharmacist

## 2024-01-26 NOTE — Telephone Encounter (Signed)
 Telephone Encounter by Ruffus Smalls, CPhT at 01/25/2024 9:01 PM  Author: Ruffus Smalls, CPhT Author Type: Pharmacy Technician Filed: 01/26/2024 11:10 AM  Note Status: Signed Cosign: Cosign Not Required Encounter Date: 01/25/2024  Editor: Ruffus Smalls, CPhT (Pharmacy Technician)             Sent message to [facility] for new rx. New rx that was sent in for a patient on 01/23/24, but the quantity is incorrect. They wrote for (14 day supply) instead of 4mls (28 day supply).       Entered new Rx with 4mL supply Dupixent  for 28 days with refills.   Aleck Puls, PharmD, BCPS, CPP Clinical Pharmacist  [facility]

## 2024-01-27 ENCOUNTER — Encounter (HOSPITAL_COMMUNITY): Payer: Self-pay

## 2024-01-27 ENCOUNTER — Ambulatory Visit: Payer: Self-pay

## 2024-01-27 ENCOUNTER — Other Ambulatory Visit: Payer: Self-pay

## 2024-01-27 ENCOUNTER — Inpatient Hospital Stay (HOSPITAL_COMMUNITY)
Admission: EM | Admit: 2024-01-27 | Discharge: 2024-01-28 | DRG: 190 | Discharge status: Against Medical Advice | Attending: Internal Medicine | Admitting: Internal Medicine

## 2024-01-27 ENCOUNTER — Encounter (HOSPITAL_COMMUNITY): Payer: Self-pay | Admitting: *Deleted

## 2024-01-27 ENCOUNTER — Emergency Department (HOSPITAL_COMMUNITY)

## 2024-01-27 ENCOUNTER — Emergency Department (HOSPITAL_COMMUNITY)
Admission: EM | Admit: 2024-01-27 | Discharge: 2024-01-27 | Disposition: A | Discharge status: Home / Self Care | Source: Home / Self Care | Attending: Emergency Medicine | Admitting: Emergency Medicine

## 2024-01-27 DIAGNOSIS — Z7952 Long term (current) use of systemic steroids: Secondary | ICD-10-CM | POA: Diagnosis not present

## 2024-01-27 DIAGNOSIS — I2510 Atherosclerotic heart disease of native coronary artery without angina pectoris: Secondary | ICD-10-CM | POA: Diagnosis present

## 2024-01-27 DIAGNOSIS — I10 Essential (primary) hypertension: Secondary | ICD-10-CM | POA: Diagnosis not present

## 2024-01-27 DIAGNOSIS — I5042 Chronic combined systolic (congestive) and diastolic (congestive) heart failure: Secondary | ICD-10-CM | POA: Diagnosis not present

## 2024-01-27 DIAGNOSIS — Z8673 Personal history of transient ischemic attack (TIA), and cerebral infarction without residual deficits: Secondary | ICD-10-CM | POA: Diagnosis not present

## 2024-01-27 DIAGNOSIS — I509 Heart failure, unspecified: Secondary | ICD-10-CM | POA: Diagnosis present

## 2024-01-27 DIAGNOSIS — Z9861 Coronary angioplasty status: Secondary | ICD-10-CM

## 2024-01-27 DIAGNOSIS — Z885 Allergy status to narcotic agent status: Secondary | ICD-10-CM | POA: Diagnosis not present

## 2024-01-27 DIAGNOSIS — Z955 Presence of coronary angioplasty implant and graft: Secondary | ICD-10-CM | POA: Diagnosis not present

## 2024-01-27 DIAGNOSIS — I4891 Unspecified atrial fibrillation: Secondary | ICD-10-CM | POA: Diagnosis not present

## 2024-01-27 DIAGNOSIS — Z8249 Family history of ischemic heart disease and other diseases of the circulatory system: Secondary | ICD-10-CM

## 2024-01-27 DIAGNOSIS — I110 Hypertensive heart disease with heart failure: Secondary | ICD-10-CM | POA: Diagnosis present

## 2024-01-27 DIAGNOSIS — I482 Chronic atrial fibrillation, unspecified: Secondary | ICD-10-CM | POA: Insufficient documentation

## 2024-01-27 DIAGNOSIS — R7989 Other specified abnormal findings of blood chemistry: Secondary | ICD-10-CM | POA: Diagnosis not present

## 2024-01-27 DIAGNOSIS — Z79899 Other long term (current) drug therapy: Secondary | ICD-10-CM | POA: Diagnosis not present

## 2024-01-27 DIAGNOSIS — Z5329 Procedure and treatment not carried out because of patient's decision for other reasons: Secondary | ICD-10-CM | POA: Diagnosis not present

## 2024-01-27 DIAGNOSIS — Z7901 Long term (current) use of anticoagulants: Secondary | ICD-10-CM | POA: Diagnosis not present

## 2024-01-27 DIAGNOSIS — D72829 Elevated white blood cell count, unspecified: Secondary | ICD-10-CM | POA: Insufficient documentation

## 2024-01-27 DIAGNOSIS — Z881 Allergy status to other antibiotic agents status: Secondary | ICD-10-CM | POA: Diagnosis not present

## 2024-01-27 DIAGNOSIS — F1110 Opioid abuse, uncomplicated: Secondary | ICD-10-CM | POA: Diagnosis not present

## 2024-01-27 DIAGNOSIS — E785 Hyperlipidemia, unspecified: Secondary | ICD-10-CM | POA: Diagnosis present

## 2024-01-27 DIAGNOSIS — F1590 Other stimulant use, unspecified, uncomplicated: Secondary | ICD-10-CM | POA: Diagnosis not present

## 2024-01-27 DIAGNOSIS — Z91041 Radiographic dye allergy status: Secondary | ICD-10-CM | POA: Diagnosis not present

## 2024-01-27 DIAGNOSIS — J441 Chronic obstructive pulmonary disease with (acute) exacerbation: Principal | ICD-10-CM | POA: Diagnosis present

## 2024-01-27 DIAGNOSIS — I252 Old myocardial infarction: Secondary | ICD-10-CM

## 2024-01-27 DIAGNOSIS — F17210 Nicotine dependence, cigarettes, uncomplicated: Secondary | ICD-10-CM | POA: Diagnosis not present

## 2024-01-27 DIAGNOSIS — J9601 Acute respiratory failure with hypoxia: Secondary | ICD-10-CM | POA: Diagnosis present

## 2024-01-27 DIAGNOSIS — I491 Atrial premature depolarization: Secondary | ICD-10-CM | POA: Diagnosis not present

## 2024-01-27 DIAGNOSIS — Z7951 Long term (current) use of inhaled steroids: Secondary | ICD-10-CM | POA: Diagnosis not present

## 2024-01-27 DIAGNOSIS — R062 Wheezing: Secondary | ICD-10-CM | POA: Diagnosis not present

## 2024-01-27 DIAGNOSIS — Z8619 Personal history of other infectious and parasitic diseases: Secondary | ICD-10-CM

## 2024-01-27 DIAGNOSIS — R140 Abdominal distension (gaseous): Secondary | ICD-10-CM | POA: Diagnosis not present

## 2024-01-27 DIAGNOSIS — Z8679 Personal history of other diseases of the circulatory system: Secondary | ICD-10-CM | POA: Diagnosis not present

## 2024-01-27 DIAGNOSIS — E7849 Other hyperlipidemia: Secondary | ICD-10-CM | POA: Diagnosis not present

## 2024-01-27 DIAGNOSIS — R069 Unspecified abnormalities of breathing: Secondary | ICD-10-CM | POA: Diagnosis not present

## 2024-01-27 DIAGNOSIS — R0602 Shortness of breath: Secondary | ICD-10-CM | POA: Diagnosis not present

## 2024-01-27 DIAGNOSIS — R000 Tachycardia, unspecified: Secondary | ICD-10-CM | POA: Diagnosis not present

## 2024-01-27 DIAGNOSIS — R079 Chest pain, unspecified: Secondary | ICD-10-CM | POA: Diagnosis not present

## 2024-01-27 DIAGNOSIS — I480 Paroxysmal atrial fibrillation: Secondary | ICD-10-CM | POA: Diagnosis not present

## 2024-01-27 LAB — I-STAT VENOUS BLOOD GAS, ED
Acid-Base Excess: 6 mmol/L — ABNORMAL HIGH (ref 0.0–2.0)
Bicarbonate: 31.5 mmol/L — ABNORMAL HIGH (ref 20.0–28.0)
Calcium, Ion: 1.13 mmol/L — ABNORMAL LOW (ref 1.15–1.40)
HCT: 45 % (ref 39.0–52.0)
Hemoglobin: 15.3 g/dL (ref 13.0–17.0)
O2 Saturation: 97 %
Potassium: 3.2 mmol/L — ABNORMAL LOW (ref 3.5–5.1)
Sodium: 143 mmol/L (ref 135–145)
TCO2: 33 mmol/L — ABNORMAL HIGH (ref 22–32)
pCO2, Ven: 45.4 mmHg (ref 44–60)
pH, Ven: 7.448 — ABNORMAL HIGH (ref 7.25–7.43)
pO2, Ven: 89 mmHg — ABNORMAL HIGH (ref 32–45)

## 2024-01-27 LAB — CBC WITH DIFFERENTIAL/PLATELET
Abs Immature Granulocytes: 0.18 K/uL — ABNORMAL HIGH (ref 0.00–0.07)
Basophils Absolute: 0.1 K/uL (ref 0.0–0.1)
Basophils Relative: 0 %
Eosinophils Absolute: 0 K/uL (ref 0.0–0.5)
Eosinophils Relative: 0 %
HCT: 48.4 % (ref 39.0–52.0)
Hemoglobin: 15.5 g/dL (ref 13.0–17.0)
Immature Granulocytes: 1 %
Lymphocytes Relative: 22 %
Lymphs Abs: 3.6 K/uL (ref 0.7–4.0)
MCH: 31.3 pg (ref 26.0–34.0)
MCHC: 32 g/dL (ref 30.0–36.0)
MCV: 97.6 fL (ref 80.0–100.0)
Monocytes Absolute: 2 K/uL — ABNORMAL HIGH (ref 0.1–1.0)
Monocytes Relative: 12 %
Neutro Abs: 10.6 K/uL — ABNORMAL HIGH (ref 1.7–7.7)
Neutrophils Relative %: 65 %
Platelets: 352 K/uL (ref 150–400)
RBC: 4.96 MIL/uL (ref 4.22–5.81)
RDW: 13.7 % (ref 11.5–15.5)
WBC: 16.4 K/uL — ABNORMAL HIGH (ref 4.0–10.5)
nRBC: 0 % (ref 0.0–0.2)

## 2024-01-27 LAB — COMPREHENSIVE METABOLIC PANEL WITH GFR
ALT: 60 U/L — ABNORMAL HIGH (ref 0–44)
AST: 19 U/L (ref 15–41)
Albumin: 3.4 g/dL — ABNORMAL LOW (ref 3.5–5.0)
Alkaline Phosphatase: 91 U/L (ref 38–126)
Anion gap: 10 (ref 5–15)
BUN: 14 mg/dL (ref 8–23)
CO2: 29 mmol/L (ref 22–32)
Calcium: 9 mg/dL (ref 8.9–10.3)
Chloride: 101 mmol/L (ref 98–111)
Creatinine, Ser: 1.11 mg/dL (ref 0.61–1.24)
GFR, Estimated: >60 mL/min (ref >60)
Glucose, Bld: 140 mg/dL — ABNORMAL HIGH (ref 70–99)
Potassium: 3.6 mmol/L (ref 3.5–5.1)
Sodium: 140 mmol/L (ref 135–145)
Total Bilirubin: 0.9 mg/dL (ref 0.0–1.2)
Total Protein: 6.4 g/dL — ABNORMAL LOW (ref 6.5–8.1)

## 2024-01-27 LAB — BASIC METABOLIC PANEL WITH GFR
Anion gap: 10 (ref 5–15)
BUN: 17 mg/dL (ref 8–23)
CO2: 30 mmol/L (ref 22–32)
Calcium: 9 mg/dL (ref 8.9–10.3)
Chloride: 102 mmol/L (ref 98–111)
Creatinine, Ser: 1.12 mg/dL (ref 0.61–1.24)
GFR, Estimated: >60 mL/min (ref >60)
Glucose, Bld: 130 mg/dL — ABNORMAL HIGH (ref 70–99)
Potassium: 4.5 mmol/L (ref 3.5–5.1)
Sodium: 142 mmol/L (ref 135–145)

## 2024-01-27 LAB — TROPONIN I (HIGH SENSITIVITY)
Troponin I (High Sensitivity): 17 ng/L (ref <18)
Troponin I (High Sensitivity): 15 ng/L (ref <18)

## 2024-01-27 LAB — CBC
HCT: 45.3 % (ref 39.0–52.0)
Hemoglobin: 14.7 g/dL (ref 13.0–17.0)
MCH: 31.5 pg (ref 26.0–34.0)
MCHC: 32.5 g/dL (ref 30.0–36.0)
MCV: 97.2 fL (ref 80.0–100.0)
Platelets: 342 K/uL (ref 150–400)
RBC: 4.66 MIL/uL (ref 4.22–5.81)
RDW: 13.7 % (ref 11.5–15.5)
WBC: 13.4 K/uL — ABNORMAL HIGH (ref 4.0–10.5)
nRBC: 0 % (ref 0.0–0.2)

## 2024-01-27 LAB — BRAIN NATRIURETIC PEPTIDE
B Natriuretic Peptide: 98.5 pg/mL (ref 0.0–100.0)
B Natriuretic Peptide: 139 pg/mL — ABNORMAL HIGH (ref 0.0–100.0)

## 2024-01-27 MED ORDER — ALBUTEROL SULFATE (2.5 MG/3ML) 0.083% IN NEBU
5.0000 mg | INHALATION_SOLUTION | Freq: Once | RESPIRATORY_TRACT | Status: AC
  Administered 2024-01-27: 5 mg via RESPIRATORY_TRACT
  Filled 2024-01-27: qty 6

## 2024-01-27 MED ORDER — THIAMINE MONONITRATE 100 MG PO TABS
100.0000 mg | ORAL_TABLET | Freq: Every day | ORAL | Status: DC
  Administered 2024-01-28: 100 mg via ORAL
  Filled 2024-01-27: qty 1

## 2024-01-27 MED ORDER — NITROGLYCERIN 0.4 MG SL SUBL
0.4000 mg | SUBLINGUAL_TABLET | SUBLINGUAL | Status: DC | PRN
  Administered 2024-01-27: 0.4 mg via SUBLINGUAL
  Filled 2024-01-27: qty 1

## 2024-01-27 MED ORDER — METOPROLOL TARTRATE 25 MG PO TABS
25.0000 mg | ORAL_TABLET | Freq: Once | ORAL | Status: AC
Start: 2024-01-27 — End: 2024-01-27
  Administered 2024-01-27: 25 mg via ORAL
  Filled 2024-01-27: qty 1

## 2024-01-27 MED ORDER — DILTIAZEM HCL ER COATED BEADS 120 MG PO CP24
120.0000 mg | ORAL_CAPSULE | Freq: Once | ORAL | Status: AC
  Administered 2024-01-27: 120 mg via ORAL
  Filled 2024-01-27: qty 1

## 2024-01-27 MED ORDER — ACETAMINOPHEN 325 MG PO TABS: 650.0000 mg | ORAL_TABLET | Freq: Four times a day (QID) | ORAL | Status: DC | PRN

## 2024-01-27 MED ORDER — LOSARTAN POTASSIUM 50 MG PO TABS
50.0000 mg | ORAL_TABLET | Freq: Every day | ORAL | Status: DC
  Administered 2024-01-28: 50 mg via ORAL
  Filled 2024-01-27: qty 1

## 2024-01-27 MED ORDER — APIXABAN 5 MG PO TABS: 5.0000 mg | ORAL_TABLET | Freq: Two times a day (BID) | ORAL | Status: DC

## 2024-01-27 MED ORDER — METOPROLOL TARTRATE 25 MG PO TABS
25.0000 mg | ORAL_TABLET | Freq: Two times a day (BID) | ORAL | Status: DC
  Administered 2024-01-27 – 2024-01-28 (×2): 25 mg via ORAL
  Filled 2024-01-27 (×2): qty 1

## 2024-01-27 MED ORDER — IPRATROPIUM-ALBUTEROL 0.5-2.5 (3) MG/3ML IN SOLN
3.0000 mL | Freq: Once | RESPIRATORY_TRACT | Status: AC
  Administered 2024-01-27: 3 mL via RESPIRATORY_TRACT
  Filled 2024-01-27: qty 3

## 2024-01-27 MED ORDER — DILTIAZEM HCL ER COATED BEADS 120 MG PO CP24
120.0000 mg | ORAL_CAPSULE | Freq: Every day | ORAL | Status: DC
Start: 2024-01-28 — End: 2024-01-28
  Administered 2024-01-28: 120 mg via ORAL
  Filled 2024-01-27: qty 1

## 2024-01-27 MED ORDER — NICOTINE 21 MG/24HR TD PT24
21.0000 mg | MEDICATED_PATCH | Freq: Every day | TRANSDERMAL | Status: DC
  Administered 2024-01-28 (×2): 21 mg via TRANSDERMAL
  Filled 2024-01-27 (×2): qty 1

## 2024-01-27 MED ORDER — ATORVASTATIN CALCIUM 40 MG PO TABS
40.0000 mg | ORAL_TABLET | Freq: Every day | ORAL | Status: DC
  Administered 2024-01-28: 40 mg via ORAL
  Filled 2024-01-27: qty 1

## 2024-01-27 MED ORDER — ACETAMINOPHEN 650 MG RE SUPP: 650.0000 mg | Freq: Four times a day (QID) | RECTAL | Status: DC | PRN

## 2024-01-27 MED ORDER — APIXABAN 5 MG PO TABS
5.0000 mg | ORAL_TABLET | Freq: Two times a day (BID) | ORAL | Status: DC
  Administered 2024-01-27 – 2024-01-28 (×2): 5 mg via ORAL
  Filled 2024-01-27 (×2): qty 1

## 2024-01-27 NOTE — ED Provider Notes (Signed)
 [place] EMERGENCY DEPARTMENT AT [facility] Provider Note   CSN: [id number] Arrival date & time: 01/27/24  9743     Patient presents with: Shortness of Breath   Francisco [name] is a 63 y.o. male.   The history is provided by the patient and medical records.  Shortness of Breath Francisco [name] is a 63 y.o. male who presents to the Emergency Department complaining of difficulty breathing. He presents the emergency department for evaluation of shortness of breath that is been present over the last week but progressively worsening. He has a cough productive of yellow sputum. He has associated nausea. No fevers, chest pain, abdominal pain. He is compliant with his home medications. EMS reports initial oxygen  saturations of 81%. He was treated with duo nebs, 2 g magnesium  sulfate, 125 mg Solu-Medrol . He reports partial improvement with treatments by EMS.   1/2 ppd, no alcohol.  Injects meth - last three days ago  Prior to Admission medications   Medication Sig Start Date End Date Taking? Authorizing Provider  albuterol  (VENTOLIN  HFA) 108 (90 Base) MCG/ACT inhaler Inhale 2 puffs into the lungs every 4 (four) hours as needed for wheezing or shortness of breath. 01/09/24   [name], [name], DO  Albuterol -Budesonide  90-80 MCG/ACT AERO Inhale 2 puffs into the lungs every 4 (four) hours as needed (wheeze, shortness of breath). 01/23/24   Hunsucker, [name], MD  apixaban  (ELIQUIS ) 5 MG TABS tablet Take 1 tablet (5 mg total) by mouth 2 (two) times daily. 01/25/24   [name], [name], DO  atorvastatin  (LIPITOR ) 40 MG tablet Take 1 tablet (40 mg total) by mouth daily. 01/17/24   [name], [name], DO  diltiazem  (CARDIZEM  CD) 120 MG 24 hr capsule Take 1 capsule (120 mg total) by mouth daily. 12/16/23   [name], [name], DO  Dupilumab  (DUPIXENT ) 300 MG/2ML SOAJ Inject 300 mg into the skin every 14 (fourteen) days. 01/26/24   Hunsucker, [name], MD  fluticasone  (FLONASE ) 50 MCG/ACT nasal spray Place 1  spray into both nostrils daily. 01/25/24   [name], [name], DO  ipratropium-albuterol  (DUONEB) 0.5-2.5 (3) MG/3ML SOLN Take 3 mLs by nebulization every 6 (six) hours as needed. 01/23/24   Hunsucker, [name], MD  loratadine  (CLARITIN ) 10 MG tablet Take 1 tablet (10 mg total) by mouth daily. 11/28/23 11/27/24  [name], [name], DO  losartan  (COZAAR ) 50 MG tablet Take 1 tablet (50 mg total) by mouth daily. 11/23/23   Patwardhan, Newman PARAS, MD  metoprolol  tartrate (LOPRESSOR ) 25 MG tablet Take 1 tablet (25 mg total) by mouth 2 (two) times daily. 12/26/23   [name], [name], DO  nicotine  (NICODERM CQ  - DOSED IN MG/24 HOURS) 14 mg/24hr patch Place 1 patch (14 mg total) onto the skin daily. 10/10/23   [name], [name], DO  nitroGLYCERIN  (NITROSTAT ) 0.4 MG SL tablet Place 1 tablet (0.4 mg total) under the tongue every 5 (five) minutes x 3 doses as needed for chest pain. 11/17/23   [name], [name], DO  predniSONE  (DELTASONE ) 20 MG tablet Take 2 tablets (40 mg total) by mouth daily with breakfast for 5 days, THEN 1 tablet (20 mg total) daily with breakfast for 5 days. 01/23/24 02/02/24  Hunsucker, [name], MD  thiamine  (VITAMIN B-1) 100 MG tablet Take 1 tablet (100 mg total) by mouth daily. 08/15/23   Krishnan, Gokul, MD    Allergies: Ivp dye [iodinated contrast media], Tramadol, and Doxycycline     Review of Systems  Respiratory:  Positive for shortness of breath.   All other systems  reviewed and are negative.   Updated Vital Signs BP (!) 166/101   Pulse 84   Temp 97.7 F (36.5 C) (Oral)   Resp 16   SpO2 98%   Physical Exam Vitals and nursing note reviewed.  Constitutional:      Appearance: He is well-developed.  HENT:     Head: Normocephalic and atraumatic.  Cardiovascular:     Rate and Rhythm: Regular rhythm. Tachycardia present.     Heart sounds: No murmur heard. Pulmonary:     Effort: Pulmonary effort is normal. No respiratory distress.     Comments: Decreased air movement in bilateral bases,  occasional expiratory wheezes in the upper lung fields Abdominal:     Palpations: Abdomen is soft.     Tenderness: There is no abdominal tenderness. There is no guarding or rebound.  Musculoskeletal:        General: No swelling or tenderness.  Skin:    General: Skin is warm and dry.  Neurological:     Mental Status: He is alert and oriented to person, place, and time.  Psychiatric:        Behavior: Behavior normal.     (all labs ordered are listed, but only abnormal results are displayed) Labs Reviewed  CBC WITH DIFFERENTIAL/PLATELET - Abnormal; Notable for the following components:      Result Value   WBC 16.4 (*)    Neutro Abs 10.6 (*)    Monocytes Absolute 2.0 (*)    Abs Immature Granulocytes 0.18 (*)    All other components within normal limits  COMPREHENSIVE METABOLIC PANEL WITH GFR - Abnormal; Notable for the following components:   Glucose, Bld 140 (*)    Total Protein 6.4 (*)    Albumin  3.4 (*)    ALT 60 (*)    All other components within normal limits  I-STAT VENOUS BLOOD GAS, ED - Abnormal; Notable for the following components:   pH, Ven 7.448 (*)    pO2, Ven 89 (*)    Bicarbonate 31.5 (*)    TCO2 33 (*)    Acid-Base Excess 6.0 (*)    Potassium 3.2 (*)    Calcium , Ion 1.13 (*)    All other components within normal limits  BRAIN NATRIURETIC PEPTIDE  TROPONIN I (HIGH SENSITIVITY)  TROPONIN I (HIGH SENSITIVITY)    EKG: EKG Interpretation Date/Time:  Friday January 27 2024 03:03:34 EDT Ventricular Rate:  106 PR Interval:  149 QRS Duration:  81 QT Interval:  347 QTC Calculation: 461 R Axis:   56  Text Interpretation: Sinus tachycardia RAE, consider biatrial enlargement Confirmed by Griselda Norris [phone]) on 01/27/2024 3:07:31 AM  Radiology: [facility] Chest Port 1 View Result Date: 01/27/2024 CLINICAL DATA:  Shortness of breath EXAM: PORTABLE CHEST 1 VIEW COMPARISON:  01/21/2024 FINDINGS: Cardiac shadow is within normal limits. Lungs are well aerated  bilaterally. No focal infiltrate or effusion is seen. No bony abnormality is noted. IMPRESSION: No active disease. Electronically Signed   By: Oneil Devonshire M.D.   On: 01/27/2024 03:54     Procedures   Medications Ordered in the ED  nitroGLYCERIN  (NITROSTAT ) SL tablet 0.4 mg (0.4 mg Sublingual Given 01/27/24 0554)  apixaban  (ELIQUIS ) tablet 5 mg (has no administration in time range)  albuterol  (PROVENTIL ) (2.5 MG/3ML) 0.083% nebulizer solution 5 mg (5 mg Nebulization Given 01/27/24 0329)  metoprolol  tartrate (LOPRESSOR ) tablet 25 mg (25 mg Oral Given 01/27/24 0430)  diltiazem  (CARDIZEM  CD) 24 hr capsule 120 mg (120 mg Oral Given 01/27/24 0647)  Medical Decision Making Amount and/or Complexity of Data Reviewed Labs: ordered. Radiology: ordered.  Risk Prescription drug management.   Patient with history of COPD, hypertension, paroxysmal atrial fibrillation on anticoagulation, methamphetamine use here for evaluation of shortness of breath. He received Solu-Medrol , magnesium  and albuterol  prior to ED arrival. Patient with decreased air movement on initial evaluation with occasional wheezes. No respiratory distress. He was treated with additional albuterol . He was found to be significantly hypertensive and he was given his home medications, which he had not taken yet today as well as nitroglycerin . His blood pressure did improve on his home medications. On repeat evaluation he does feel improved, does have ongoing mildly decreased air movement as well as wheezes. Patient states that he wants to go home and declines admission to the hospital. He does have hypoxia with ambulation, ongoing wheezing. Discussed risks of discharge and he acknowledges these risks. Also discussed need to stop smoking, avoid methamphetamine as this is deleterious to his health.  Current picture is not consistent with PE, ACS, pneumonia. Patient just started a prednisone  taper, declines  discharge home on additional prednisone . He is closely followed by pulmonology. Discussed reaching out to his pulmonologist regarding medication changes as well as return precautions for progressive or new concerning symptoms.     Final diagnoses:  COPD exacerbation [facility])  Essential hypertension    ED Discharge Orders     None          Griselda Norris, MD 01/27/24 [phone]

## 2024-01-27 NOTE — ED Triage Notes (Signed)
 Pt arrived from home with EMS c/o sob x 1 week. Took one of his own duonebs prior to ems and EMS gave additional duoneb (15mg  albuterol , 1.5mg  atrovent ) also given 2g Mag sulfate, and 125mg  solumedrol. Initial bp 220/128 pulse 112, rr 26, room air sat 81%--increased to 91% on 8L neb

## 2024-01-27 NOTE — ED Notes (Signed)
 CCMD called for cardiac monitoring.

## 2024-01-27 NOTE — ED Provider Notes (Signed)
 [place] EMERGENCY DEPARTMENT AT [facility] Provider Note   CSN: [id number] Arrival date & time: 01/27/24  2001     Patient presents with: Respiratory Distress   Francisco [name] is a 63 y.o. male.  With a history of COPD and heart failure who presents to the ED for respiratory distress.  Patient was discharged earlier this morning after COPD exacerbation being treated with albuterol .  He returns today for shortness of breath.  Breathing worsened at home despite albuterol .  EMS gave DuoNeb, 2 g magnesium  and IV Solu-Medrol  which made him feel much better.  No chest pain fevers chills   HPI     Prior to Admission medications   Medication Sig Start Date End Date Taking? Authorizing Provider  albuterol  (VENTOLIN  HFA) 108 (90 Base) MCG/ACT inhaler Inhale 2 puffs into the lungs every 4 (four) hours as needed for wheezing or shortness of breath. 01/09/24  Yes [name], [name], DO  apixaban  (ELIQUIS ) 5 MG TABS tablet Take 1 tablet (5 mg total) by mouth 2 (two) times daily. 01/25/24  Yes [name], [name], DO  atorvastatin  (LIPITOR ) 40 MG tablet Take 1 tablet (40 mg total) by mouth daily. 01/17/24  Yes [name], [name], DO  diltiazem  (CARDIZEM  CD) 120 MG 24 hr capsule Take 1 capsule (120 mg total) by mouth daily. 12/16/23  Yes [name], [name], DO  Dupilumab  (DUPIXENT ) 300 MG/2ML SOAJ Inject 300 mg into the skin every 14 (fourteen) days. 01/26/24  Yes Hunsucker, [name], MD  fluticasone  (FLONASE ) 50 MCG/ACT nasal spray Place 1 spray into both nostrils daily. 01/25/24  Yes [name], [name], DO  ipratropium-albuterol  (DUONEB) 0.5-2.5 (3) MG/3ML SOLN Take 3 mLs by nebulization every 6 (six) hours as needed. 01/23/24  Yes Hunsucker, [name], MD  loratadine  (CLARITIN ) 10 MG tablet Take 1 tablet (10 mg total) by mouth daily. 11/28/23 11/27/24 Yes [name], [name], DO  losartan  (COZAAR ) 50 MG tablet Take 1 tablet (50 mg total) by mouth daily. 11/23/23  Yes Patwardhan, Newman PARAS, MD  metoprolol  tartrate  (LOPRESSOR ) 25 MG tablet Take 1 tablet (25 mg total) by mouth 2 (two) times daily. 12/26/23  Yes [name], [name], DO  nicotine  (NICODERM CQ  - DOSED IN MG/24 HOURS) 14 mg/24hr patch Place 1 patch (14 mg total) onto the skin daily. 10/10/23  Yes [name], [name], DO  nitroGLYCERIN  (NITROSTAT ) 0.4 MG SL tablet Place 1 tablet (0.4 mg total) under the tongue every 5 (five) minutes x 3 doses as needed for chest pain. 11/17/23  Yes [name], [name], DO  predniSONE  (DELTASONE ) 20 MG tablet Take 2 tablets (40 mg total) by mouth daily with breakfast for 5 days, THEN 1 tablet (20 mg total) daily with breakfast for 5 days. 01/23/24 02/02/24 Yes Hunsucker, [name], MD  thiamine  (VITAMIN B-1) 100 MG tablet Take 1 tablet (100 mg total) by mouth daily. 08/15/23  Yes Krishnan, Gokul, MD  Albuterol -Budesonide  90-80 MCG/ACT AERO Inhale 2 puffs into the lungs every 4 (four) hours as needed (wheeze, shortness of breath). Patient not taking: Reported on 01/27/2024 01/23/24   Hunsucker, [name], MD    Allergies: Ivp dye [iodinated contrast media], Tramadol, and Doxycycline     Review of Systems  Updated Vital Signs BP (!) 180/91 (BP Location: Right Arm)   Pulse (!) 112   Temp 97.7 F (36.5 C) (Oral)   Resp 19   Ht 6' (1.829 m)   Wt 88.7 kg   SpO2 97%   BMI 26.53 kg/m   Physical Exam Vitals and nursing note  reviewed.  Constitutional:      General: He is not in acute distress. HENT:     Head: Normocephalic and atraumatic.  Eyes:     Pupils: Pupils are equal, round, and reactive to light.  Cardiovascular:     Rate and Rhythm: Normal rate and regular rhythm.  Pulmonary:     Effort: Pulmonary effort is normal.     Breath sounds: Wheezing present.  Abdominal:     Palpations: Abdomen is soft.     Tenderness: There is no abdominal tenderness.  Skin:    General: Skin is warm and dry.  Neurological:     Mental Status: He is alert.  Psychiatric:        Mood and Affect: Mood normal.     (all labs ordered  are listed, but only abnormal results are displayed) Labs Reviewed  BASIC METABOLIC PANEL WITH GFR - Abnormal; Notable for the following components:      Result Value   Glucose, Bld 130 (*)    All other components within normal limits  CBC - Abnormal; Notable for the following components:   WBC 13.4 (*)    All other components within normal limits  BRAIN NATRIURETIC PEPTIDE - Abnormal; Notable for the following components:   B Natriuretic Peptide 139.0 (*)    All other components within normal limits    EKG: EKG Interpretation Date/Time:  Friday January 27 2024 20:14:37 EDT Ventricular Rate:  114 PR Interval:  140 QRS Duration:  105 QT Interval:  314 QTC Calculation: 423 R Axis:   10  Text Interpretation: Sinus tachycardia Ventricular premature complex Artifact in lead(s) I II aVR aVL Confirmed by Pamella Sharper [phone]) on 01/27/2024 10:17:37 PM  Radiology: [facility] Chest 2 View Result Date: 01/27/2024 CLINICAL DATA:  Shortness of breath EXAM: CHEST - 2 VIEW COMPARISON:  01/27/2024 FINDINGS: Heart and mediastinal contours are within normal limits. No focal opacities or effusions. No acute bony abnormality. No pneumothorax. IMPRESSION: No active cardiopulmonary disease. Electronically Signed   By: Franky Crease M.D.   On: 01/27/2024 20:50   DG Chest Port 1 View Result Date: 01/27/2024 CLINICAL DATA:  Shortness of breath EXAM: PORTABLE CHEST 1 VIEW COMPARISON:  01/21/2024 FINDINGS: Cardiac shadow is within normal limits. Lungs are well aerated bilaterally. No focal infiltrate or effusion is seen. No bony abnormality is noted. IMPRESSION: No active disease. Electronically Signed   By: Oneil Devonshire M.D.   On: 01/27/2024 03:54     Procedures   Medications Ordered in the ED  ipratropium-albuterol  (DUONEB) 0.5-2.5 (3) MG/3ML nebulizer solution 3 mL (has no administration in time range)  nicotine  (NICODERM CQ  - dosed in mg/24 hours) patch 21 mg (has no administration in time range)     Clinical Course as of 01/27/24 2217  Fri Jan 27, 2024  2152 Discussed with admitting hospitalist who accept patient for admission [MP]    Clinical Course User Index [MP] Pamella Sharper LABOR, DO                                 Medical Decision Making 63 year old male with history as above returns for another COPD exacerbation.  Was just discharged this morning after COPD exacerbation.  Noted to be hypoxic on room air 79% with EMS.  Improvement after DuoNebs Solu-Medrol  magnesium .  Given that he was just discharged and failed outpatient management of COPD will plan for inpatient management after workup is completed .  Will provide another DuoNeb treatment here and continue to monitor respiratory status closely.  Amount and/or Complexity of Data Reviewed Labs: ordered. Radiology: ordered.  Risk Prescription drug management. Decision regarding hospitalization.        Final diagnoses:  COPD exacerbation [facility])    ED Discharge Orders     None          Pamella Ozell LABOR, DO 01/27/24 2217

## 2024-01-27 NOTE — Telephone Encounter (Signed)
 Copied from CRM [id number]. Topic: Clinical - Prescription Issue >> Jan 26, 2024  3:17 PM Benton O wrote: Reason for CRM patient is calling because: i was there monday and he wrote me a new rescue inhaler prescription  and when turning in to the pharmacy they said  needed a prior authorization and patient is trying to see whats going on with that . Please give patient a call concerning the prior authorization for the Airsupra 90-80MCG/ACT aerosol. Patient is wanting to know what's going on orr what can we do or what are we going to do  [phone]  Please advise Dr. Annella looks like PA has been denied

## 2024-01-27 NOTE — ED Notes (Signed)
 MD made aware of high BP, no new orders at this time. Pt denies any CP or dizziness. Reports this happened last night and they had to give me nitroglycerin 

## 2024-01-27 NOTE — ED Notes (Signed)
 Pt verbalized understanding of the risks of leaving AMA up to and including death. Pt given strict return precautions which he verbalized.

## 2024-01-27 NOTE — Telephone Encounter (Signed)
 FYI Only or Action Required?: FYI only for provider.  Patient is followed in Pulmonology for COPD, last seen on 01/23/2024 by Hunsucker, [name], MD.  Called Nurse Triage reporting Shortness of Breath.  Symptoms began yesterday.  Interventions attempted: Rescue inhaler, Maintenance inhaler, and Nebulizer treatments.  Symptoms are: gradually worsening.  Triage Disposition: Go to ED Now (Notify PCP)  Patient/caregiver understands and will follow disposition?: Yes  Copied from CRM [id number]. Topic: Clinical - Red Word Triage >> Jan 27, 2024  2:57 PM Leila BROCKS wrote: Red Word that prompted transfer to Nurse Triage: Patient [phone] was speaking someone and got cut off from Dr. Leatha CMA Marcus, regarding PA for Airsupra 90-80MCG/ACT aerosol. Patient went to the [facility] Emergency Department at [facility] last night was dicharged this morning. Patient states shortness of breath, gasping for air, wheezing, dizziness, denies no pain. Please advise. Reason for Disposition  Oxygen  level (e.g., pulse oximetry) 90% or lower  Answer Assessment - Initial Assessment Questions Pt went to ED last night for severe sob and wheezing. Provider wanted to admit overnight, pt noncompliant and left. Pt calls in tonight for same and states he's still waiting on prior auth for some of his meds. Pt has audible stridor and O2 saturation was 84% on RA when checked with RN on phone. RN immediately recommended ED evaluation and pt agreeable.   1. RESPIRATORY STATUS: Describe your breathing? (e.g., wheezing, shortness of breath, unable to speak, severe coughing)      Sob audible stridor 2. ONSET: When did this breathing problem begin?      Yesterday night 3. PATTERN Does the difficult breathing come and go, or has it been constant since it started?      constant 4. SEVERITY: How bad is your breathing? (e.g., mild, moderate, severe)      severe 5. RECURRENT SYMPTOM: Have you had  difficulty breathing before? If Yes, ask: When was the last time? and What happened that time?      Yes, exacerbation 6. CARDIAC HISTORY: Do you have any history of heart disease? (e.g., heart attack, angina, bypass surgery, angioplasty)      denies 7. LUNG HISTORY: Do you have any history of lung disease?  (e.g., pulmonary embolus, asthma, emphysema)     copd 8. CAUSE: What do you think is causing the breathing problem?      unsure 9. OTHER SYMPTOMS: Do you have any other symptoms? (e.g., chest pain, cough, dizziness, fever, runny nose)     dizziness 10. O2 SATURATION MONITOR:  Do you use an oxygen  saturation monitor (pulse oximeter) at home? If Yes, ask: What is your reading (oxygen  level) today? What is your usual oxygen  saturation reading? (e.g., 95%)       84%  12. TRAVEL: Have you traveled out of the country in the last month? (e.g., travel history, exposures)       denies  Protocols used: Breathing Difficulty-A-AH

## 2024-01-27 NOTE — ED Notes (Addendum)
 Pt ambulates with steady gait, no assistance. No signs or complaints of discomfort. O2 sats remained at 96 throught ambulation. After ambulation pt O2 sats dropped to 86. Pt proceeded to take 4 pumps on his albuterol  inhaler and insistent on going home.MD and RN notified.

## 2024-01-27 NOTE — ED Triage Notes (Signed)
 Pt arrived from home with EMS c/o sob and respiratory distress. Was just here last night for same symptoms. Pt reports when he went home he felt good but it only lasts a day. Reports that this episode woke him up and O2 72% RA, difficulty breathing and dyspneic with minimal exertion. Has not been able to get oxgyen for home  79% RA even after 2 duonebs at home. 96% on 8L neb  2 duonebs at home, 5mg  albuterol . Ems did additional duoneb., total 3 duonebs   125mg  solumedrol  2g mag   20g L hand 184/100 98 HR  Aox4

## 2024-01-28 ENCOUNTER — Inpatient Hospital Stay (HOSPITAL_COMMUNITY)
Admission: EM | Admit: 2024-01-28 | Discharge: 2024-02-01 | DRG: 189 | Disposition: A | Discharge status: Home / Self Care | Attending: Infectious Diseases | Admitting: Infectious Diseases

## 2024-01-28 ENCOUNTER — Other Ambulatory Visit: Payer: Self-pay

## 2024-01-28 ENCOUNTER — Emergency Department (HOSPITAL_COMMUNITY)

## 2024-01-28 ENCOUNTER — Encounter (HOSPITAL_COMMUNITY): Payer: Self-pay

## 2024-01-28 DIAGNOSIS — I5040 Unspecified combined systolic (congestive) and diastolic (congestive) heart failure: Secondary | ICD-10-CM | POA: Diagnosis present

## 2024-01-28 DIAGNOSIS — R140 Abdominal distension (gaseous): Secondary | ICD-10-CM | POA: Diagnosis not present

## 2024-01-28 DIAGNOSIS — Z7952 Long term (current) use of systemic steroids: Secondary | ICD-10-CM

## 2024-01-28 DIAGNOSIS — F17210 Nicotine dependence, cigarettes, uncomplicated: Secondary | ICD-10-CM | POA: Diagnosis present

## 2024-01-28 DIAGNOSIS — R7989 Other specified abnormal findings of blood chemistry: Secondary | ICD-10-CM | POA: Diagnosis not present

## 2024-01-28 DIAGNOSIS — E872 Acidosis, unspecified: Secondary | ICD-10-CM | POA: Diagnosis present

## 2024-01-28 DIAGNOSIS — R0600 Dyspnea, unspecified: Principal | ICD-10-CM

## 2024-01-28 DIAGNOSIS — Z91041 Radiographic dye allergy status: Secondary | ICD-10-CM

## 2024-01-28 DIAGNOSIS — J441 Chronic obstructive pulmonary disease with (acute) exacerbation: Secondary | ICD-10-CM | POA: Diagnosis not present

## 2024-01-28 DIAGNOSIS — F1590 Other stimulant use, unspecified, uncomplicated: Secondary | ICD-10-CM | POA: Diagnosis present

## 2024-01-28 DIAGNOSIS — Z8679 Personal history of other diseases of the circulatory system: Secondary | ICD-10-CM

## 2024-01-28 DIAGNOSIS — B1920 Unspecified viral hepatitis C without hepatic coma: Secondary | ICD-10-CM | POA: Diagnosis present

## 2024-01-28 DIAGNOSIS — G8929 Other chronic pain: Secondary | ICD-10-CM | POA: Diagnosis present

## 2024-01-28 DIAGNOSIS — I2489 Other forms of acute ischemic heart disease: Secondary | ICD-10-CM | POA: Diagnosis present

## 2024-01-28 DIAGNOSIS — I252 Old myocardial infarction: Secondary | ICD-10-CM

## 2024-01-28 DIAGNOSIS — Z8261 Family history of arthritis: Secondary | ICD-10-CM

## 2024-01-28 DIAGNOSIS — Z823 Family history of stroke: Secondary | ICD-10-CM

## 2024-01-28 DIAGNOSIS — J9621 Acute and chronic respiratory failure with hypoxia: Secondary | ICD-10-CM | POA: Diagnosis present

## 2024-01-28 DIAGNOSIS — I480 Paroxysmal atrial fibrillation: Secondary | ICD-10-CM | POA: Diagnosis present

## 2024-01-28 DIAGNOSIS — Z955 Presence of coronary angioplasty implant and graft: Secondary | ICD-10-CM

## 2024-01-28 DIAGNOSIS — Z8249 Family history of ischemic heart disease and other diseases of the circulatory system: Secondary | ICD-10-CM

## 2024-01-28 DIAGNOSIS — I5042 Chronic combined systolic (congestive) and diastolic (congestive) heart failure: Secondary | ICD-10-CM | POA: Diagnosis present

## 2024-01-28 DIAGNOSIS — E7849 Other hyperlipidemia: Secondary | ICD-10-CM

## 2024-01-28 DIAGNOSIS — I110 Hypertensive heart disease with heart failure: Secondary | ICD-10-CM | POA: Diagnosis present

## 2024-01-28 DIAGNOSIS — M549 Dorsalgia, unspecified: Secondary | ICD-10-CM | POA: Diagnosis present

## 2024-01-28 DIAGNOSIS — M545 Low back pain, unspecified: Secondary | ICD-10-CM | POA: Diagnosis present

## 2024-01-28 DIAGNOSIS — M546 Pain in thoracic spine: Secondary | ICD-10-CM | POA: Diagnosis present

## 2024-01-28 DIAGNOSIS — Z881 Allergy status to other antibiotic agents status: Secondary | ICD-10-CM

## 2024-01-28 DIAGNOSIS — I10 Essential (primary) hypertension: Secondary | ICD-10-CM | POA: Diagnosis present

## 2024-01-28 DIAGNOSIS — F1510 Other stimulant abuse, uncomplicated: Secondary | ICD-10-CM | POA: Diagnosis present

## 2024-01-28 DIAGNOSIS — Z885 Allergy status to narcotic agent status: Secondary | ICD-10-CM

## 2024-01-28 DIAGNOSIS — I2510 Atherosclerotic heart disease of native coronary artery without angina pectoris: Secondary | ICD-10-CM | POA: Diagnosis present

## 2024-01-28 DIAGNOSIS — Z23 Encounter for immunization: Secondary | ICD-10-CM

## 2024-01-28 DIAGNOSIS — J9601 Acute respiratory failure with hypoxia: Principal | ICD-10-CM | POA: Diagnosis present

## 2024-01-28 DIAGNOSIS — Z91199 Patient's noncompliance with other medical treatment and regimen due to unspecified reason: Secondary | ICD-10-CM

## 2024-01-28 DIAGNOSIS — E785 Hyperlipidemia, unspecified: Secondary | ICD-10-CM | POA: Diagnosis present

## 2024-01-28 DIAGNOSIS — Z1152 Encounter for screening for COVID-19: Secondary | ICD-10-CM

## 2024-01-28 DIAGNOSIS — R000 Tachycardia, unspecified: Secondary | ICD-10-CM | POA: Diagnosis present

## 2024-01-28 DIAGNOSIS — Z79899 Other long term (current) drug therapy: Secondary | ICD-10-CM

## 2024-01-28 DIAGNOSIS — Z7901 Long term (current) use of anticoagulants: Secondary | ICD-10-CM

## 2024-01-28 LAB — CBC WITH DIFFERENTIAL/PLATELET
Abs Immature Granulocytes: 0.38 K/uL — ABNORMAL HIGH (ref 0.00–0.07)
Basophils Absolute: 0.1 K/uL (ref 0.0–0.1)
Basophils Relative: 0 %
Eosinophils Absolute: 0 K/uL (ref 0.0–0.5)
Eosinophils Relative: 0 %
HCT: 48.7 % (ref 39.0–52.0)
Hemoglobin: 15.2 g/dL (ref 13.0–17.0)
Immature Granulocytes: 2 %
Lymphocytes Relative: 5 %
Lymphs Abs: 1 K/uL (ref 0.7–4.0)
MCH: 31.5 pg (ref 26.0–34.0)
MCHC: 31.2 g/dL (ref 30.0–36.0)
MCV: 100.8 fL — ABNORMAL HIGH (ref 80.0–100.0)
Monocytes Absolute: 1.8 K/uL — ABNORMAL HIGH (ref 0.1–1.0)
Monocytes Relative: 9 %
Neutro Abs: 15.7 K/uL — ABNORMAL HIGH (ref 1.7–7.7)
Neutrophils Relative %: 84 %
Platelets: 334 K/uL (ref 150–400)
RBC: 4.83 MIL/uL (ref 4.22–5.81)
RDW: 13.7 % (ref 11.5–15.5)
WBC: 18.9 K/uL — ABNORMAL HIGH (ref 4.0–10.5)
nRBC: 0 % (ref 0.0–0.2)

## 2024-01-28 LAB — I-STAT CHEM 8, ED
BUN: 36 mg/dL — ABNORMAL HIGH (ref 8–23)
Calcium, Ion: 1.19 mmol/L (ref 1.15–1.40)
Chloride: 101 mmol/L (ref 98–111)
Creatinine, Ser: 1.2 mg/dL (ref 0.61–1.24)
Glucose, Bld: 158 mg/dL — ABNORMAL HIGH (ref 70–99)
HCT: 47 % (ref 39.0–52.0)
Hemoglobin: 16 g/dL (ref 13.0–17.0)
Potassium: 5 mmol/L (ref 3.5–5.1)
Sodium: 144 mmol/L (ref 135–145)
TCO2: 37 mmol/L — ABNORMAL HIGH (ref 22–32)

## 2024-01-28 LAB — COMPREHENSIVE METABOLIC PANEL WITH GFR
ALT: 45 U/L — ABNORMAL HIGH (ref 0–44)
AST: 22 U/L (ref 15–41)
Albumin: 3.5 g/dL (ref 3.5–5.0)
Alkaline Phosphatase: 76 U/L (ref 38–126)
Anion gap: 13 (ref 5–15)
BUN: 23 mg/dL (ref 8–23)
CO2: 32 mmol/L (ref 22–32)
Calcium: 8.9 mg/dL (ref 8.9–10.3)
Chloride: 99 mmol/L (ref 98–111)
Creatinine, Ser: 1.24 mg/dL (ref 0.61–1.24)
GFR, Estimated: >60 mL/min (ref >60)
Glucose, Bld: 164 mg/dL — ABNORMAL HIGH (ref 70–99)
Potassium: 5 mmol/L (ref 3.5–5.1)
Sodium: 144 mmol/L (ref 135–145)
Total Bilirubin: 1.1 mg/dL (ref 0.0–1.2)
Total Protein: 6.3 g/dL — ABNORMAL LOW (ref 6.5–8.1)

## 2024-01-28 LAB — I-STAT CG4 LACTIC ACID, ED: Lactic Acid, Venous: 3.6 mmol/L (ref 0.5–1.9)

## 2024-01-28 LAB — BRAIN NATRIURETIC PEPTIDE: B Natriuretic Peptide: 188.1 pg/mL — ABNORMAL HIGH (ref 0.0–100.0)

## 2024-01-28 LAB — MAGNESIUM: Magnesium: 2.5 mg/dL — ABNORMAL HIGH (ref 1.7–2.4)

## 2024-01-28 LAB — TROPONIN I (HIGH SENSITIVITY): Troponin I (High Sensitivity): 20 ng/L — ABNORMAL HIGH (ref <18)

## 2024-01-28 LAB — MRSA NEXT GEN BY PCR, NASAL: MRSA by PCR Next Gen: NOT DETECTED

## 2024-01-28 MED ORDER — NITROGLYCERIN 0.4 MG SL SUBL
0.4000 mg | SUBLINGUAL_TABLET | SUBLINGUAL | Status: DC | PRN
Start: 2024-01-28 — End: 2024-01-30

## 2024-01-28 MED ORDER — METHYLPREDNISOLONE SODIUM SUCC 40 MG IJ SOLR
40.0000 mg | Freq: Two times a day (BID) | INTRAMUSCULAR | Status: DC
  Administered 2024-01-29 (×2): 40 mg via INTRAVENOUS
  Filled 2024-01-28 (×2): qty 1

## 2024-01-28 MED ORDER — METHYLPREDNISOLONE SODIUM SUCC 125 MG IJ SOLR
125.0000 mg | Freq: Once | INTRAMUSCULAR | Status: AC
  Administered 2024-01-28: 125 mg via INTRAVENOUS
  Filled 2024-01-28: qty 2

## 2024-01-28 MED ORDER — ATORVASTATIN CALCIUM 40 MG PO TABS
40.0000 mg | ORAL_TABLET | Freq: Every day | ORAL | Status: DC
  Administered 2024-01-29 – 2024-02-01 (×4): 40 mg via ORAL
  Filled 2024-01-28 (×4): qty 1

## 2024-01-28 MED ORDER — LOSARTAN POTASSIUM 50 MG PO TABS: 50.0000 mg | ORAL_TABLET | Freq: Every day | ORAL | Status: DC

## 2024-01-28 MED ORDER — SODIUM CHLORIDE 0.9% FLUSH: 3.0000 mL | INTRAVENOUS | Status: DC | PRN

## 2024-01-28 MED ORDER — IPRATROPIUM-ALBUTEROL 0.5-2.5 (3) MG/3ML IN SOLN
3.0000 mL | Freq: Four times a day (QID) | RESPIRATORY_TRACT | Status: DC | PRN
  Administered 2024-01-28 – 2024-01-30 (×2): 3 mL via RESPIRATORY_TRACT
  Filled 2024-01-28: qty 3
  Filled 2024-01-28: qty 6

## 2024-01-28 MED ORDER — LOSARTAN POTASSIUM 50 MG PO TABS
50.0000 mg | ORAL_TABLET | Freq: Every day | ORAL | Status: DC
Start: 2024-01-28 — End: 2024-02-01
  Administered 2024-01-28 – 2024-02-01 (×5): 50 mg via ORAL
  Filled 2024-01-28 (×3): qty 1
  Filled 2024-01-28 (×2): qty 2

## 2024-01-28 MED ORDER — HYDRALAZINE HCL 20 MG/ML IJ SOLN
10.0000 mg | Freq: Four times a day (QID) | INTRAMUSCULAR | Status: DC | PRN
  Administered 2024-01-28: 10 mg via INTRAVENOUS
  Filled 2024-01-28: qty 1

## 2024-01-28 MED ORDER — ONDANSETRON HCL 4 MG/2ML IJ SOLN
4.0000 mg | Freq: Four times a day (QID) | INTRAMUSCULAR | Status: DC | PRN
Start: 2024-01-28 — End: 2024-01-30

## 2024-01-28 MED ORDER — ARFORMOTEROL TARTRATE 15 MCG/2ML IN NEBU
15.0000 ug | INHALATION_SOLUTION | Freq: Two times a day (BID) | RESPIRATORY_TRACT | Status: DC
  Administered 2024-01-28: 15 ug via RESPIRATORY_TRACT
  Filled 2024-01-28: qty 2

## 2024-01-28 MED ORDER — AZITHROMYCIN 500 MG IV SOLR
500.0000 mg | INTRAVENOUS | Status: DC
  Administered 2024-01-29: 500 mg via INTRAVENOUS
  Filled 2024-01-28: qty 5

## 2024-01-28 MED ORDER — ORAL CARE MOUTH RINSE: 15.0000 mL | OROMUCOSAL | Status: DC | PRN

## 2024-01-28 MED ORDER — PANTOPRAZOLE SODIUM 40 MG PO TBEC
40.0000 mg | DELAYED_RELEASE_TABLET | Freq: Every day | ORAL | Status: AC
  Administered 2024-01-28 – 2024-01-31 (×4): 40 mg via ORAL
  Filled 2024-01-28 (×4): qty 1

## 2024-01-28 MED ORDER — INFLUENZA VIRUS VACC SPLIT PF (FLUZONE) 0.5 ML IM SUSY: 0.5000 mL | PREFILLED_SYRINGE | INTRAMUSCULAR | Status: DC

## 2024-01-28 MED ORDER — ACETAMINOPHEN 650 MG RE SUPP: 650.0000 mg | Freq: Four times a day (QID) | RECTAL | Status: DC | PRN

## 2024-01-28 MED ORDER — IPRATROPIUM-ALBUTEROL 0.5-2.5 (3) MG/3ML IN SOLN
3.0000 mL | Freq: Four times a day (QID) | RESPIRATORY_TRACT | Status: DC
  Administered 2024-01-28 (×2): 3 mL via RESPIRATORY_TRACT
  Filled 2024-01-28 (×2): qty 3

## 2024-01-28 MED ORDER — ACETAMINOPHEN 325 MG PO TABS
650.0000 mg | ORAL_TABLET | Freq: Four times a day (QID) | ORAL | Status: DC | PRN
  Filled 2024-01-28: qty 2

## 2024-01-28 MED ORDER — DILTIAZEM HCL ER COATED BEADS 120 MG PO CP24
120.0000 mg | ORAL_CAPSULE | Freq: Every day | ORAL | Status: DC
  Administered 2024-01-29 – 2024-02-01 (×4): 120 mg via ORAL
  Filled 2024-01-28 (×4): qty 1

## 2024-01-28 MED ORDER — ARFORMOTEROL TARTRATE 15 MCG/2ML IN NEBU: 15.0000 ug | INHALATION_SOLUTION | Freq: Two times a day (BID) | RESPIRATORY_TRACT | Status: DC

## 2024-01-28 MED ORDER — SODIUM CHLORIDE 0.9% FLUSH
3.0000 mL | Freq: Two times a day (BID) | INTRAVENOUS | Status: DC
  Administered 2024-01-28 – 2024-01-29 (×2): 3 mL via INTRAVENOUS

## 2024-01-28 MED ORDER — IPRATROPIUM-ALBUTEROL 0.5-2.5 (3) MG/3ML IN SOLN
3.0000 mL | Freq: Once | RESPIRATORY_TRACT | Status: AC
  Administered 2024-01-28: 3 mL via RESPIRATORY_TRACT
  Filled 2024-01-28: qty 3

## 2024-01-28 MED ORDER — LORATADINE 10 MG PO TABS
10.0000 mg | ORAL_TABLET | Freq: Every day | ORAL | Status: DC
  Administered 2024-01-28 – 2024-02-01 (×5): 10 mg via ORAL
  Filled 2024-01-28 (×5): qty 1

## 2024-01-28 MED ORDER — LORAZEPAM 2 MG/ML IJ SOLN
0.5000 mg | Freq: Once | INTRAMUSCULAR | Status: AC
  Administered 2024-01-28: 0.5 mg via INTRAVENOUS
  Filled 2024-01-28: qty 1

## 2024-01-28 MED ORDER — ONDANSETRON HCL 4 MG PO TABS
4.0000 mg | ORAL_TABLET | Freq: Four times a day (QID) | ORAL | Status: DC | PRN
Start: 2024-01-28 — End: 2024-01-30

## 2024-01-28 MED ORDER — NITROGLYCERIN IN D5W 200-5 MCG/ML-% IV SOLN
0.0000 ug/min | INTRAVENOUS | Status: DC
  Administered 2024-01-28: 5 ug/min via INTRAVENOUS
  Filled 2024-01-28: qty 250

## 2024-01-28 MED ORDER — ALBUTEROL SULFATE (2.5 MG/3ML) 0.083% IN NEBU: 2.5000 mg | INHALATION_SOLUTION | RESPIRATORY_TRACT | Status: DC | PRN

## 2024-01-28 MED ORDER — KETOROLAC TROMETHAMINE 30 MG/ML IJ SOLN
30.0000 mg | Freq: Once | INTRAMUSCULAR | Status: AC
  Administered 2024-01-28: 30 mg via INTRAVENOUS
  Filled 2024-01-28: qty 1

## 2024-01-28 MED ORDER — BUDESONIDE 0.25 MG/2ML IN SUSP
0.2500 mg | Freq: Two times a day (BID) | RESPIRATORY_TRACT | Status: DC
  Administered 2024-01-28 – 2024-01-30 (×4): 0.25 mg via RESPIRATORY_TRACT
  Filled 2024-01-28 (×4): qty 2

## 2024-01-28 MED ORDER — SODIUM CHLORIDE 0.9 % IV SOLN: 250.0000 mL | INTRAVENOUS | Status: AC | PRN

## 2024-01-28 MED ORDER — AZITHROMYCIN 500 MG IV SOLR: 500.0000 mg | INTRAVENOUS | Status: DC

## 2024-01-28 MED ORDER — PREDNISONE 10 MG PO TABS
50.0000 mg | ORAL_TABLET | Freq: Every day | ORAL | Status: DC
  Administered 2024-01-28: 50 mg via ORAL
  Filled 2024-01-28: qty 5

## 2024-01-28 MED ORDER — BUDESONIDE 0.25 MG/2ML IN SUSP
0.2500 mg | Freq: Two times a day (BID) | RESPIRATORY_TRACT | Status: DC
  Administered 2024-01-28: 0.25 mg via RESPIRATORY_TRACT
  Filled 2024-01-28: qty 2

## 2024-01-28 MED ORDER — METOPROLOL TARTRATE 25 MG PO TABS
25.0000 mg | ORAL_TABLET | Freq: Two times a day (BID) | ORAL | Status: DC
  Administered 2024-01-28 – 2024-02-01 (×8): 25 mg via ORAL
  Filled 2024-01-28 (×8): qty 1

## 2024-01-28 MED ORDER — MAGNESIUM SULFATE 2 GM/50ML IV SOLN
2.0000 g | Freq: Once | INTRAVENOUS | Status: AC
  Administered 2024-01-28: 2 g via INTRAVENOUS
  Filled 2024-01-28: qty 50

## 2024-01-28 MED ORDER — NICOTINE 14 MG/24HR TD PT24
14.0000 mg | MEDICATED_PATCH | Freq: Every day | TRANSDERMAL | Status: DC
  Administered 2024-01-29 – 2024-02-01 (×4): 14 mg via TRANSDERMAL
  Filled 2024-01-28 (×4): qty 1

## 2024-01-28 MED ORDER — MORPHINE SULFATE (PF) 4 MG/ML IV SOLN
4.0000 mg | Freq: Once | INTRAVENOUS | Status: AC
  Administered 2024-01-28: 4 mg via INTRAVENOUS
  Filled 2024-01-28: qty 1

## 2024-01-28 MED ORDER — AZITHROMYCIN 500 MG PO TABS
500.0000 mg | ORAL_TABLET | Freq: Every day | ORAL | Status: DC
  Administered 2024-01-28: 500 mg via ORAL
  Filled 2024-01-28: qty 1

## 2024-01-28 MED ORDER — IPRATROPIUM-ALBUTEROL 0.5-2.5 (3) MG/3ML IN SOLN
3.0000 mL | Freq: Four times a day (QID) | RESPIRATORY_TRACT | Status: DC
  Administered 2024-01-29 – 2024-01-30 (×7): 3 mL via RESPIRATORY_TRACT
  Filled 2024-01-28 (×6): qty 3

## 2024-01-28 MED ORDER — APIXABAN 5 MG PO TABS
5.0000 mg | ORAL_TABLET | Freq: Two times a day (BID) | ORAL | Status: DC
  Administered 2024-01-28 – 2024-02-01 (×8): 5 mg via ORAL
  Filled 2024-01-28 (×8): qty 1

## 2024-01-28 NOTE — Progress Notes (Signed)
 Patient is leaving AMA, doctor notified. Patient states we are not doing anything so he is going home. AMA form completed. IV removed. Patient is alert and orientated.

## 2024-01-28 NOTE — ED Triage Notes (Signed)
 Patient bib [facility] from home with [facility]. He has been seen the past 3 days with shortness of breathe and it is continuously getting worse. EMS reports that he was 96% on room air on their arrival, hypertensive, and tachypneic. EMS reports rhonchi and wheezing and labored breathing. Patient states he has taken 5 duonebs at home today and they have not helped, ems gave one also and it didn't help.  PMH: COPD, CHF, Asthma, everyday smoker.

## 2024-01-28 NOTE — Hospital Course (Signed)
 CC: SOB x 1 week. HPI: Francisco [name] is a 63 y.o. male with medical history significant of CHF, COPD, CAD, hypertension who presents emergency department due to shortness of breath.  Patient was found to be hypoxic satting in the low 80s by EMS was given nebulizers as well as steroids..  In the ER he was given nebulizers with improvement in his breathing.  Patient wanted to be discharged left the hospital.  He subsequently developed worsening breathing and return for further evaluation.  On arrival he was hypoxic in the low 80s and hemodynamically stable.  Labs were obtained which showed BNP 98, troponin 17, WBC 16.4, hemoglobin 15.5, AST 19, ALT 60, pH 7.4.  Patient underwent chest x-ray which showed no acute findings.  Patient was admitted for further workup.   Significant Events: Admitted 01/27/2024 for COPD exacerbation   Admission Labs: WBC 13.4, HgB 14.7, plt 342 Na 142, K 4.5, CO2 of 30, BUN 17, Scr 1.12, glu 130 BNP 139 MRSA screen negative  Admission Imaging Studies: CXR No active cardiopulmonary disease.   Significant Labs:   Significant Imaging Studies:   Antibiotic Therapy: Anti-infectives (From admission, onward)    Start     Dose/Rate Route Frequency Ordered Stop   01/28/24 0515  azithromycin  (ZITHROMAX ) tablet 500 mg        500 mg Oral Daily 01/28/24 0418         Procedures:   Consultants:

## 2024-01-28 NOTE — Plan of Care (Signed)
  RN reported that patient has increased oxygen  requirement 7 L  and has poor airway entry on physical exam and minimum wheezing.  Evaluated patient at bedside.  Giving DuoNeb nebulizer treatment and budesonide .  Starting BiPAP.  -Given patient is requiring BiPAP now changing level of care to progressive unit.

## 2024-01-28 NOTE — H&P (Signed)
 History and Physical    Francisco [name] FMW:[id number] DOB: 1960/09/07 DOA: 01/28/2024  PCP: Heddy Barren, DO   Patient coming from: Home   Chief Complaint:  Chief Complaint  Patient presents with   Shortness of Breath   ED TRIAGE note:Patient bib [facility] from home with [facility]. He has been seen the past 3 days with shortness of breathe and it is continuously getting worse. EMS reports that he was 96% on room air on their arrival, hypertensive, and tachypneic. EMS reports rhonchi and wheezing and labored breathing. Patient states he has taken 5 duonebs at home today and they have not helped, ems gave one also and it didn't help.   PMH: COPD, CHF, Asthma, everyday smoker.   HPI:  Francisco [name] is a 63 y.o. male with medical history significant of atrial fibrillation on Eliquis , hyperlipidemia, CHF, COPD, CAD and essential hypertension presented emergency department complaining of worsening shortness of breath.  Patient was admitted yesterday 10/10 for asthma exacerbation however he wanted to be discharged earlier today and again coming tonight with worsening shortness of breath which is not improving using DuoNeb at home. Denies any chest pain, palpitation, headache and blurry vision. Leaving from the hospital patient has 3 cigarettes at home that worsening shortness of breath.  Patient denies any fever, chill,   ED Course:  At presentation to ED O2 sat dropped to 83% on room air currently 96% on 4 L oxygen .  Patient is still tachycardic tachypneic and borderline hypertensive after breathing treatment in the ED blood pressure has been improved to 123/82.  Heart rate also improved.  Lab work, CBC showing leukocytosis 18.9 in the setting of IV steroid treatment yesterday.  CMP unremarkable. Elevated troponin 20 and pending second troponin level. Elevated lactic acid level 3.6.  Slight elevated BNP 188. Chest x-ray no active disease process. EKG showing sinus tachycardia heart rate 110 and low  voltage QRS.  In the ED patient received DuoNeb nebulizer, Ativan , mag sulfate 2 g, Solu-Medrol , morphine .  Patient also started on nitroglycerin  drip initially for concern for hypertension and CHF however before starting nitro drip patient blood pressure has been improved already with DuoNeb and steroid treatment as well as Ativan  and morphine .  Hospitalist has been consulted for further evaluation management of acute hypoxic respiratory failure in the setting of COPD exacerbation, lactic acidosis and elevated troponin from demand ischemia.   Significant labs in the ED: Lab Orders         Respiratory (~20 pathogens) panel by PCR         Magnesium          Comprehensive metabolic panel         CBC with Differential         Brain natriuretic peptide         CBC         Comprehensive metabolic panel         I-Stat Lactic Acid         I-stat chem 8, ED       Review of Systems:  Review of Systems  Constitutional:  Negative for chills, fever and weight loss.  Respiratory:  Positive for shortness of breath and wheezing. Negative for cough.   Cardiovascular:  Negative for chest pain and leg swelling.  Neurological:  Negative for dizziness and headaches.  Psychiatric/Behavioral:  The patient is not nervous/anxious.     Past Medical History:  Diagnosis Date   CHF (congestive heart failure) (HCC)    COPD (chronic  obstructive pulmonary disease) (HCC)    COPD with acute exacerbation (HCC) 10/21/2021   Coronary artery disease    a. s/p prior PCI.   Hepatitis-C    Hypertension    IV drug abuse (HCC)    a. 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   Medically noncompliant    MI (myocardial infarction) (HCC)    Tobacco abuse     Past Surgical History:  Procedure Laterality Date   BACK SURGERY     NECK SURGERY     RIGHT/LEFT HEART CATH AND CORONARY ANGIOGRAPHY N/A 04/30/2019   Procedure: RIGHT/LEFT HEART CATH AND CORONARY ANGIOGRAPHY;  Surgeon: Anner Alm ORN, MD;   Location: [facility] INVASIVE CV LAB;  Service: Cardiovascular;  Laterality: N/A;     reports that he has been smoking cigarettes. He has been exposed to tobacco smoke. He has never used smokeless tobacco. He reports current drug use. Frequency: 2.00 times per week. Drug: Methamphetamines. He reports that he does not drink alcohol.  Allergies  Allergen Reactions   Ivp Dye [Iodinated Contrast Media] Other (See Comments)    Seizures    Tramadol Other (See Comments)    Seizures    Doxycycline  Other (See Comments)    Burning sensation to his hands.    Family History  Problem Relation Age of Onset   Rheum arthritis Mother    CVA Father    Heart disease Paternal Grandfather     Prior to Admission medications   Medication Sig Start Date End Date Taking? Authorizing Provider  albuterol  (VENTOLIN  HFA) 108 (90 Base) MCG/ACT inhaler Inhale 2 puffs into the lungs every 4 (four) hours as needed for wheezing or shortness of breath. 01/09/24   [name], [name], DO  Albuterol -Budesonide  90-80 MCG/ACT AERO Inhale 2 puffs into the lungs every 4 (four) hours as needed (wheeze, shortness of breath). Patient not taking: Reported on 01/27/2024 01/23/24   Hunsucker, [name], MD  apixaban  (ELIQUIS ) 5 MG TABS tablet Take 1 tablet (5 mg total) by mouth 2 (two) times daily. 01/25/24   [name], [name], DO  atorvastatin  (LIPITOR ) 40 MG tablet Take 1 tablet (40 mg total) by mouth daily. 01/17/24   [name], [name], DO  diltiazem  (CARDIZEM  CD) 120 MG 24 hr capsule Take 1 capsule (120 mg total) by mouth daily. 12/16/23   [name], [name], DO  Dupilumab  (DUPIXENT ) 300 MG/2ML SOAJ Inject 300 mg into the skin every 14 (fourteen) days. 01/26/24   Hunsucker, [name], MD  fluticasone  (FLONASE ) 50 MCG/ACT nasal spray Place 1 spray into both nostrils daily. 01/25/24   [name], [name], DO  ipratropium-albuterol  (DUONEB) 0.5-2.5 (3) MG/3ML SOLN Take 3 mLs by nebulization every 6 (six) hours as needed. 01/23/24   Hunsucker, [name],  MD  loratadine  (CLARITIN ) 10 MG tablet Take 1 tablet (10 mg total) by mouth daily. 11/28/23 11/27/24  [name], [name], DO  losartan  (COZAAR ) 50 MG tablet Take 1 tablet (50 mg total) by mouth daily. 11/23/23   Patwardhan, Newman PARAS, MD  metoprolol  tartrate (LOPRESSOR ) 25 MG tablet Take 1 tablet (25 mg total) by mouth 2 (two) times daily. 12/26/23   [name], [name], DO  nicotine  (NICODERM CQ  - DOSED IN MG/24 HOURS) 14 mg/24hr patch Place 1 patch (14 mg total) onto the skin daily. 10/10/23   [name], [name], DO  nitroGLYCERIN  (NITROSTAT ) 0.4 MG SL tablet Place 1 tablet (0.4 mg total) under the tongue every 5 (five) minutes x 3 doses as needed for chest pain. 11/17/23   [name], [name], DO  predniSONE  (DELTASONE )  20 MG tablet Take 2 tablets (40 mg total) by mouth daily with breakfast for 5 days, THEN 1 tablet (20 mg total) daily with breakfast for 5 days. 01/23/24 02/02/24  Hunsucker, [name], MD  thiamine  (VITAMIN B-1) 100 MG tablet Take 1 tablet (100 mg total) by mouth daily. 08/15/23   Verdene Purchase, MD     Physical Exam: Vitals:   01/29/24 0000 01/29/24 0015 01/29/24 0030 01/29/24 0045  BP: (!) 163/91 (!) 163/85 (!) 165/95 (!) 165/91  Pulse: 84 83 80 82  Resp: 18 18 16 17   Temp:      TempSrc:      SpO2: 99% 99% 98% 99%  Weight:      Height:        Physical Exam Vitals and nursing note reviewed.  Constitutional:      General: He is not in acute distress.    Appearance: He is not ill-appearing.  Cardiovascular:     Rate and Rhythm: Regular rhythm. Tachycardia present.  Pulmonary:     Effort: No tachypnea, accessory muscle usage or respiratory distress.     Breath sounds: No stridor. Wheezing present. No decreased breath sounds, rhonchi or rales.  Skin:    Capillary Refill: Capillary refill takes less than 2 seconds.  Neurological:     Mental Status: He is alert and oriented to person, place, and time.      Labs on Admission: I have personally reviewed following labs and imaging  studies  CBC: Recent Labs  Lab 01/27/24 0309 01/27/24 0322 01/27/24 2023 01/28/24 1950 01/28/24 2009  WBC 16.4*  --  13.4* 18.9*  --   NEUTROABS 10.6*  --   --  15.7*  --   HGB 15.5 15.3 14.7 15.2 16.0  HCT 48.4 45.0 45.3 48.7 47.0  MCV 97.6  --  97.2 100.8*  --   PLT 352  --  342 334  --    Basic Metabolic Panel: Recent Labs  Lab 01/27/24 0309 01/27/24 0322 01/27/24 2023 01/28/24 1950 01/28/24 2009  NA 140 143 142 144 144  K 3.6 3.2* 4.5 5.0 5.0  CL 101  --  102 99 101  CO2 29  --  30 32  --   GLUCOSE 140*  --  130* 164* 158*  BUN 14  --  17 23 36*  CREATININE 1.11  --  1.12 1.24 1.20  CALCIUM  9.0  --  9.0 8.9  --   MG  --   --   --  2.5*  --    GFR: Estimated Creatinine Clearance: 69.2 mL/min (by C-G formula based on SCr of 1.2 mg/dL). Liver Function Tests: Recent Labs  Lab 01/27/24 0309 01/28/24 1950  AST 19 22  ALT 60* 45*  ALKPHOS 91 76  BILITOT 0.9 1.1  PROT 6.4* 6.3*  ALBUMIN  3.4* 3.5   No results for input(s): LIPASE, AMYLASE in the last 168 hours. No results for input(s): AMMONIA in the last 168 hours. Coagulation Profile: No results for input(s): INR, PROTIME in the last 168 hours. Cardiac Enzymes: Recent Labs  Lab 01/27/24 0309 01/27/24 0525 01/28/24 1950  TROPONINIHS 17 15 20*   BNP (last 3 results) Recent Labs    01/27/24 0309 01/27/24 2023 01/28/24 1950  BNP 98.5 139.0* 188.1*   HbA1C: No results for input(s): HGBA1C in the last 72 hours. CBG: No results for input(s): GLUCAP in the last 168 hours. Lipid Profile: No results for input(s): CHOL, HDL, LDLCALC, TRIG, CHOLHDL, LDLDIRECT in  the last 72 hours. Thyroid  Function Tests: No results for input(s): TSH, T4TOTAL, FREET4, T3FREE, THYROIDAB in the last 72 hours. Anemia Panel: No results for input(s): VITAMINB12, FOLATE, FERRITIN, TIBC, IRON, RETICCTPCT in the last 72 hours. Urine analysis:    Component Value Date/Time    COLORURINE YELLOW 08/09/2023 2050   APPEARANCEUR HAZY (A) 08/09/2023 2050   LABSPEC 1.015 08/09/2023 2050   PHURINE 6.0 08/09/2023 2050   GLUCOSEU NEGATIVE 08/09/2023 2050   HGBUR MODERATE (A) 08/09/2023 2050   BILIRUBINUR NEGATIVE 08/09/2023 2050   KETONESUR NEGATIVE 08/09/2023 2050   PROTEINUR >=300 (A) 08/09/2023 2050   NITRITE NEGATIVE 08/09/2023 2050   LEUKOCYTESUR NEGATIVE 08/09/2023 2050    Radiological Exams on Admission: I have personally reviewed images DG Chest Port 1 View Result Date: 01/28/2024 CLINICAL DATA:  Shortness of breath EXAM: PORTABLE CHEST 1 VIEW COMPARISON:  01/27/2024 FINDINGS: The heart size and mediastinal contours are within normal limits. Both lungs are clear. The visualized skeletal structures are unremarkable. No pneumothorax. IMPRESSION: No active disease. Electronically Signed   By: Franky Crease M.D.   On: 01/28/2024 20:35   DG Chest 2 View Result Date: 01/27/2024 CLINICAL DATA:  Shortness of breath EXAM: CHEST - 2 VIEW COMPARISON:  01/27/2024 FINDINGS: Heart and mediastinal contours are within normal limits. No focal opacities or effusions. No acute bony abnormality. No pneumothorax. IMPRESSION: No active cardiopulmonary disease. Electronically Signed   By: Franky Crease M.D.   On: 01/27/2024 20:50   DG Chest Port 1 View Result Date: 01/27/2024 CLINICAL DATA:  Shortness of breath EXAM: PORTABLE CHEST 1 VIEW COMPARISON:  01/21/2024 FINDINGS: Cardiac shadow is within normal limits. Lungs are well aerated bilaterally. No focal infiltrate or effusion is seen. No bony abnormality is noted. IMPRESSION: No active disease. Electronically Signed   By: Oneil Devonshire M.D.   On: 01/27/2024 03:54     EKG: My personal interpretation of EKG shows: sinus tachycardia heart rate 110, low voltage QRS    Assessment/Plan: Principal Problem:   COPD with acute exacerbation (HCC) Active Problems:   Acute hypoxic respiratory failure (HCC)   High serum lactic acid    Elevated troponin   Essential hypertension   Hyperlipidemia   COPD exacerbation (HCC)   Paroxysmal atrial fibrillation (HCC)   History of CHF (congestive heart failure)   Continuous dependence on cigarette smoking    Assessment and Plan: COPD with acute exacerbation Acute hypoxic respiratory failure in the setting of COPD exacerbation - Patient was admitted yesterday 10/10 for acute hypoxic respiratory failure COPD exacerbation however earlier today morning patient insisted to leaving the hospital and eventually has been discharged.  Coming in the emergency department again with complaining of persistent shortness of breath which did not improve with DuoNeb treatment at home.  Patient also continued to smoke cigarette. - At presentation to ED O2 sat dropped to 83% on room air currently 96% on 4 L oxygen .  Patient is still tachycardic tachypneic and borderline hypertensive after breathing treatment in the ED blood pressure has been improved to 123/82.  Heart rate also improved. - Lab work, CBC showing leukocytosis 18.9 in the setting of IV steroid treatment yesterday.  CMP unremarkable. Elevated troponin 20 and pending second troponin level. Elevated lactic acid level 3.6.  Slight elevated BNP 188. -Chest x-ray no active disease process. -EKG showing sinus tachycardia heart rate 110 and low voltage QRS. -In the ED patient received DuoNeb nebulizer, Ativan , mag sulfate 2 g, Solu-Medrol , morphine . -At this time based on  the clinical seizure there is no concern for hypertensive urgency or significant starvation.  -Continue to treat for severe exacerbation - Continue IV Solu-Medrol  40 mg twice daily, DuoNeb every 6 hours scheduled and as needed, azithromycin  500 mg for 4 days.  Patient received azithromycin  500 mg yesterday evening when he was admitted. - Checking respiratory panel and nasal MRSA. Continue to check pulse ox and supplemental oxygen  as needed to keep O2 sat above 92% - Continue  supportive care. Addendum -RN reported that patient has increased oxygen  requirement 7 L  and has poor airway entry on physical exam and minimum wheezing.  Evaluated patient at bedside.  Giving DuoNeb nebulizer treatment and budesonide .  Starting BiPAP.  -Given patient is requiring BiPAP now changing level of care to progressive uni  Elevated lactic acid -Elevated lactic acid 3.6.  At this time patient does not have any source of infection or does not meet criteria for sepsis.  Chest x-ray unremarkable for pneumonia.  Elevated lactic acid from acute hypoxic respiratory failure rather than sepsis.  Continue to trend.  Elevated troponin-secondary demand ischemia -Troponin level 15 and 20.  Elevated troponin in the context of demand ischemia from acute hypoxic respiratory failure from COPD exacerbation. -Patient denies any chest pain or chest pressure. - EKG showing sinus tachycardia without any ST and T wave abnormality. - At this time there is no concern for acute coronary syndrome. -Continue cardiac monitoring.  Elevated blood pressure-resolved Essential hypertension History of CHF -Initially presented with elevated blood pressure care 180/95 which has been initially improved to 123/82 and again 182/91.  In the ED nitro drip has been ordered however never has been initiated given blood pressure has been improved. - Patient missed the dose of Lopressor  and losartan  for tonight.  Resuming Lopressor  25 mg twice daily, losartan  50 mg daily, Cardizem  120 mg daily and continue IV hydralazine  as needed.  Paroxysmal atrial fibrillation -Continue Cardizem , Lopressor  and Eliquis   Chronic smoking cigarette - Patient reported after going home he is smoke 3 cigarettes today which continue to worsening his shortness of breath and cough.   -Continue nicotine  patch   DVT prophylaxis:  Eliquis  Code Status:  Full Code Diet: Heart healthy diet Disposition Plan: Continue monitor improvement of COPD  consideration. Consults: None indicated at this time Admission status:   Inpatient, progressive unit  Severity of Illness: The appropriate patient status for this patient is INPATIENT. Inpatient status is judged to be reasonable and necessary in order to provide the required intensity of service to ensure the patient's safety. The patient's presenting symptoms, physical exam findings, and initial radiographic and laboratory data in the context of their chronic comorbidities is felt to place them at high risk for further clinical deterioration. Furthermore, it is not anticipated that the patient will be medically stable for discharge from the hospital within 2 midnights of admission.    * I certify that at the point of admission it is my clinical judgment that the patient will require inpatient hospital care spanning beyond 2 midnights from the point of admission due to high intensity of service, high risk for further deterioration and high frequency of surveillance required.DEWAINE   Reigna Ruperto, MD [facility]  How to contact the [facility] Attending or Consulting provider 7A - 7P or covering provider during after hours 7P -7A, for this patient.  Check the care team in [facility] and look for a) attending/consulting [facility] provider listed and b) the [facility] team listed Log into [email or web address] and use [facility]'s universal password to access. If you do not have the password, please contact the hospital operator. Locate the [facility] provider you are looking for under [facility] and page to a number that you can be directly reached. If you still have difficulty reaching the provider, please page the [facility] (Director on Call) for the Hospitalists listed on amion for assistance.  01/29/2024, 12:57 AM

## 2024-01-28 NOTE — Progress Notes (Signed)
 Patient wanting to leave AMA, went to speak with patient about health and education on the importance of medical attention. Offered to reach out to Dr if possibility of patient going today but patient stated he has made up his mind. Patient lives with a room mate and stated he has help at home. Nurse educate if symptoms return with no relieve to return to ER to seek medical attention. Assigned nurse notified DR.

## 2024-01-28 NOTE — ED Provider Notes (Signed)
 [name] EMERGENCY DEPARTMENT AT [facility] Provider Note   CSN: [id number] Arrival date & time: 01/28/24  8065     Patient presents with: Shortness of Breath   Francisco [name] is a 63 y.o. male.   63 year old male with prior medical history including COPD, CHF, CAD.  Patient complains of increased shortness of breath.  Of note patient was here yesterday.  He was briefly admitted.  He then left AMA.  He returns now with recurrent dyspnea and wheezing.  Patient is noted to be hypertensive.  He reports that he tried several breathing treatments at home that did not help.  The history is provided by the patient and medical records.       Prior to Admission medications   Medication Sig Start Date End Date Taking? Authorizing Provider  albuterol  (VENTOLIN  HFA) 108 (90 Base) MCG/ACT inhaler Inhale 2 puffs into the lungs every 4 (four) hours as needed for wheezing or shortness of breath. 01/09/24   [name], [name], DO  Albuterol -Budesonide  90-80 MCG/ACT AERO Inhale 2 puffs into the lungs every 4 (four) hours as needed (wheeze, shortness of breath). Patient not taking: Reported on 01/27/2024 01/23/24   Hunsucker, [name], MD  apixaban  (ELIQUIS ) 5 MG TABS tablet Take 1 tablet (5 mg total) by mouth 2 (two) times daily. 01/25/24   [name], [name], DO  atorvastatin  (LIPITOR ) 40 MG tablet Take 1 tablet (40 mg total) by mouth daily. 01/17/24   [name], [name], DO  diltiazem  (CARDIZEM  CD) 120 MG 24 hr capsule Take 1 capsule (120 mg total) by mouth daily. 12/16/23   [name], [name], DO  Dupilumab  (DUPIXENT ) 300 MG/2ML SOAJ Inject 300 mg into the skin every 14 (fourteen) days. 01/26/24   Hunsucker, [name], MD  fluticasone  (FLONASE ) 50 MCG/ACT nasal spray Place 1 spray into both nostrils daily. 01/25/24   [name], [name], DO  ipratropium-albuterol  (DUONEB) 0.5-2.5 (3) MG/3ML SOLN Take 3 mLs by nebulization every 6 (six) hours as needed. 01/23/24   Hunsucker, [name], MD  loratadine   (CLARITIN ) 10 MG tablet Take 1 tablet (10 mg total) by mouth daily. 11/28/23 11/27/24  [name], [name], DO  losartan  (COZAAR ) 50 MG tablet Take 1 tablet (50 mg total) by mouth daily. 11/23/23   Patwardhan, Newman PARAS, MD  metoprolol  tartrate (LOPRESSOR ) 25 MG tablet Take 1 tablet (25 mg total) by mouth 2 (two) times daily. 12/26/23   [name], [name], DO  nicotine  (NICODERM CQ  - DOSED IN MG/24 HOURS) 14 mg/24hr patch Place 1 patch (14 mg total) onto the skin daily. 10/10/23   [name], [name], DO  nitroGLYCERIN  (NITROSTAT ) 0.4 MG SL tablet Place 1 tablet (0.4 mg total) under the tongue every 5 (five) minutes x 3 doses as needed for chest pain. 11/17/23   [name], [name], DO  predniSONE  (DELTASONE ) 20 MG tablet Take 2 tablets (40 mg total) by mouth daily with breakfast for 5 days, THEN 1 tablet (20 mg total) daily with breakfast for 5 days. 01/23/24 02/02/24  Hunsucker, [name], MD  thiamine  (VITAMIN B-1) 100 MG tablet Take 1 tablet (100 mg total) by mouth daily. 08/15/23   Krishnan, Gokul, MD    Allergies: Ivp dye [iodinated contrast media], Tramadol, and Doxycycline     Review of Systems  All other systems reviewed and are negative.   Updated Vital Signs BP (!) 180/95   Pulse (!) 106   Temp 98.1 F (36.7 C)   Resp (!) 22   Ht 6' (1.829 m)   Wt 88.9 kg  SpO2 91%   BMI 26.58 kg/m   Physical Exam Vitals and nursing note reviewed.  Constitutional:      General: He is not in acute distress.    Appearance: Normal appearance. He is well-developed.  HENT:     Head: Normocephalic and atraumatic.  Eyes:     Conjunctiva/sclera: Conjunctivae normal.     Pupils: Pupils are equal, round, and reactive to light.  Cardiovascular:     Rate and Rhythm: Normal rate and regular rhythm.     Heart sounds: Normal heart sounds.  Pulmonary:     Effort: Tachypnea present. No respiratory distress.     Breath sounds: Decreased breath sounds and wheezing present.     Comments: Tachypnea present.  Bilateral  expiratory wheezes present. Abdominal:     General: There is no distension.     Palpations: Abdomen is soft.     Tenderness: There is no abdominal tenderness.  Musculoskeletal:        General: No deformity. Normal range of motion.     Cervical back: Normal range of motion and neck supple.  Skin:    General: Skin is warm and dry.  Neurological:     General: No focal deficit present.     Mental Status: He is alert and oriented to person, place, and time.     (all labs ordered are listed, but only abnormal results are displayed) Labs Reviewed  MAGNESIUM   COMPREHENSIVE METABOLIC PANEL WITH GFR  CBC WITH DIFFERENTIAL/PLATELET  BRAIN NATRIURETIC PEPTIDE  I-STAT CG4 LACTIC ACID, ED  I-STAT CHEM 8, ED  TROPONIN I (HIGH SENSITIVITY)    EKG: EKG Interpretation Date/Time:  Saturday January 28 2024 19:39:58 EDT Ventricular Rate:  110 PR Interval:  139 QRS Duration:  85 QT Interval:  309 QTC Calculation: 418 R Axis:   56  Text Interpretation: Sinus tachycardia Low voltage, extremity leads Minimal ST depression, inferior leads Baseline wander in lead(s) I II aVR Confirmed by Laurice Coy [phone]) on 01/28/2024 7:54:37 PM  Radiology: [facility] Chest 2 View Result Date: 01/27/2024 CLINICAL DATA:  Shortness of breath EXAM: CHEST - 2 VIEW COMPARISON:  01/27/2024 FINDINGS: Heart and mediastinal contours are within normal limits. No focal opacities or effusions. No acute bony abnormality. No pneumothorax. IMPRESSION: No active cardiopulmonary disease. Electronically Signed   By: Franky Crease M.D.   On: 01/27/2024 20:50   DG Chest Port 1 View Result Date: 01/27/2024 CLINICAL DATA:  Shortness of breath EXAM: PORTABLE CHEST 1 VIEW COMPARISON:  01/21/2024 FINDINGS: Cardiac shadow is within normal limits. Lungs are well aerated bilaterally. No focal infiltrate or effusion is seen. No bony abnormality is noted. IMPRESSION: No active disease. Electronically Signed   By: Oneil Devonshire M.D.   On:  01/27/2024 03:54     Procedures   Medications Ordered in the ED  LORazepam  (ATIVAN ) injection 0.5 mg (has no administration in time range)  nitroGLYCERIN  50 mg in dextrose  5 % 250 mL (0.2 mg/mL) infusion (has no administration in time range)  methylPREDNISolone  sodium succinate (SOLU-MEDROL ) 125 mg/2 mL injection 125 mg (has no administration in time range)  magnesium  sulfate IVPB 2 g 50 mL (has no administration in time range)  ipratropium-albuterol  (DUONEB) 0.5-2.5 (3) MG/3ML nebulizer solution 3 mL (has no administration in time range)  morphine  (PF) 4 MG/ML injection 4 mg (has no administration in time range)  Medical Decision Making Patient presents with hypoxia, increased work of breathing, wheezing.  Presentation is consistent with likely COPD exacerbation with possible concurrent CHF exacerbation.  Patient given Solu-Medrol , breathing treatments, magnesium .  Patient started briefly on nitroglycerin  drip.  [name] blood pressure improved with treatment.  Nitroglycerin  drip was therefore discontinued.  Patient would benefit from admission.  Hospitalist service is aware of case and will evaluate for same.  Amount and/or Complexity of Data Reviewed Labs: ordered. Radiology: ordered.  Risk Prescription drug management. Decision regarding hospitalization.   CRITICAL CARE Performed by: Maude JAYSON Galloway   Total critical care time: 30 minutes  Critical care time was exclusive of separately billable procedures and treating other patients.  Critical care was necessary to treat or prevent imminent or life-threatening deterioration.  Critical care was time spent personally by me on the following activities: development of treatment plan with patient and/or surrogate as well as nursing, discussions with consultants, evaluation of patient's response to treatment, examination of patient, obtaining history from patient or surrogate, ordering and  performing treatments and interventions, ordering and review of laboratory studies, ordering and review of radiographic studies, pulse oximetry and re-evaluation of patient's condition.      Final diagnoses:  Dyspnea, unspecified type    ED Discharge Orders     None          Galloway Maude JAYSON, MD 01/28/24 2318

## 2024-01-28 NOTE — Plan of Care (Signed)

## 2024-01-28 NOTE — H&P (Addendum)
 History and Physical    Moustapha Tooker FMW:[id number] DOB: Feb 12, 1961 DOA: 01/27/2024  PCP: Heddy Barren, DO   Chief Complaint:  resp distress  HPI: Francisco [name] is a 63 y.o. male with medical history significant of CHF, COPD, CAD, hypertension who presents emergency department due to shortness of breath.  Patient was found to be hypoxic satting in the low 80s by EMS was given nebulizers as well as steroids..  In the ER he was given nebulizers with improvement in his breathing.  Patient wanted to be discharged left the hospital.  He subsequently developed worsening breathing and return for further evaluation.  On arrival he was hypoxic in the low 80s and hemodynamically stable.  Labs were obtained which showed BNP 98, troponin 17, WBC 16.4, hemoglobin 15.5, AST 19, ALT 60, pH 7.4.  Patient underwent chest x-ray which showed no acute findings.  Patient was admitted for further workup.     Review of Systems: Review of Systems  Constitutional: Negative.  Negative for chills and fever.  HENT: Negative.    Eyes: Negative.   Respiratory: Negative.    Cardiovascular: Negative.   Gastrointestinal: Negative.   Genitourinary: Negative.   Musculoskeletal: Negative.   Skin: Negative.   Neurological: Negative.   Endo/Heme/Allergies: Negative.   Psychiatric/Behavioral: Negative.       As per HPI otherwise 10 point review of systems negative.   Allergies  Allergen Reactions   Ivp Dye [Iodinated Contrast Media] Other (See Comments)    Seizures    Tramadol Other (See Comments)    Seizures    Doxycycline  Other (See Comments)    Burning sensation to his hands.    Past Medical History:  Diagnosis Date   CHF (congestive heart failure) (HCC)    COPD (chronic obstructive pulmonary disease) (HCC)    COPD with acute exacerbation (HCC) 10/21/2021   Coronary artery disease    a. s/p prior PCI.   Hepatitis-C    Hypertension    IV drug abuse (HCC)    a. previously heroin, now methamphetamine  (04/2019).   Medically noncompliant    MI (myocardial infarction) (HCC)    Tobacco abuse     Past Surgical History:  Procedure Laterality Date   BACK SURGERY     NECK SURGERY     RIGHT/LEFT HEART CATH AND CORONARY ANGIOGRAPHY N/A 04/30/2019   Procedure: RIGHT/LEFT HEART CATH AND CORONARY ANGIOGRAPHY;  Surgeon: Anner Alm ORN, MD;  Location: [facility] INVASIVE CV LAB;  Service: Cardiovascular;  Laterality: N/A;     reports that he has been smoking cigarettes. He has been exposed to tobacco smoke. He has never used smokeless tobacco. He reports current drug use. Frequency: 2.00 times per week. Drug: Methamphetamines. He reports that he does not drink alcohol.  Family History  Problem Relation Age of Onset   Rheum arthritis Mother    CVA Father    Heart disease Paternal Grandfather     Prior to Admission medications   Medication Sig Start Date End Date Taking? Authorizing Provider  albuterol  (VENTOLIN  HFA) 108 (90 Base) MCG/ACT inhaler Inhale 2 puffs into the lungs every 4 (four) hours as needed for wheezing or shortness of breath. 01/09/24  Yes [name], [name], DO  apixaban  (ELIQUIS ) 5 MG TABS tablet Take 1 tablet (5 mg total) by mouth 2 (two) times daily. 01/25/24  Yes [name], [name], DO  atorvastatin  (LIPITOR ) 40 MG tablet Take 1 tablet (40 mg total) by mouth daily. 01/17/24  Yes [name], [name], DO  diltiazem  (CARDIZEM  CD)  120 MG 24 hr capsule Take 1 capsule (120 mg total) by mouth daily. 12/16/23  Yes [name], [name], DO  Dupilumab  (DUPIXENT ) 300 MG/2ML SOAJ Inject 300 mg into the skin every 14 (fourteen) days. 01/26/24  Yes Hunsucker, [name], MD  fluticasone  (FLONASE ) 50 MCG/ACT nasal spray Place 1 spray into both nostrils daily. 01/25/24  Yes [name], [name], DO  ipratropium-albuterol  (DUONEB) 0.5-2.5 (3) MG/3ML SOLN Take 3 mLs by nebulization every 6 (six) hours as needed. 01/23/24  Yes Hunsucker, [name], MD  loratadine  (CLARITIN ) 10 MG tablet Take 1 tablet (10 mg total) by mouth  daily. 11/28/23 11/27/24 Yes [name], [name], DO  losartan  (COZAAR ) 50 MG tablet Take 1 tablet (50 mg total) by mouth daily. 11/23/23  Yes Patwardhan, Newman PARAS, MD  metoprolol  tartrate (LOPRESSOR ) 25 MG tablet Take 1 tablet (25 mg total) by mouth 2 (two) times daily. 12/26/23  Yes [name], [name], DO  nicotine  (NICODERM CQ  - DOSED IN MG/24 HOURS) 14 mg/24hr patch Place 1 patch (14 mg total) onto the skin daily. 10/10/23  Yes [name], [name], DO  nitroGLYCERIN  (NITROSTAT ) 0.4 MG SL tablet Place 1 tablet (0.4 mg total) under the tongue every 5 (five) minutes x 3 doses as needed for chest pain. 11/17/23  Yes [name], [name], DO  predniSONE  (DELTASONE ) 20 MG tablet Take 2 tablets (40 mg total) by mouth daily with breakfast for 5 days, THEN 1 tablet (20 mg total) daily with breakfast for 5 days. 01/23/24 02/02/24 Yes Hunsucker, [name], MD  thiamine  (VITAMIN B-1) 100 MG tablet Take 1 tablet (100 mg total) by mouth daily. 08/15/23  Yes Krishnan, Gokul, MD  Albuterol -Budesonide  90-80 MCG/ACT AERO Inhale 2 puffs into the lungs every 4 (four) hours as needed (wheeze, shortness of breath). Patient not taking: Reported on 01/27/2024 01/23/24   Annella [name], MD    Physical Exam: Vitals:   01/28/24 0020 01/28/24 0028 01/28/24 0325 01/28/24 0351  BP: (!) 157/114  (!) 159/101 (!) 164/86  Pulse: 93  87 86  Resp: (!) 24  16   Temp:  99.9 F (37.7 C)  97.7 F (36.5 C)  TempSrc:  Oral  Oral  SpO2: 95%  97% 97%  Weight:      Height:       Physical Exam Constitutional:      Appearance: He is normal weight.  HENT:     Head: Normocephalic.     Nose: Nose normal.     Mouth/Throat:     Mouth: Mucous membranes are dry.  Eyes:     Conjunctiva/sclera: Conjunctivae normal.     Pupils: Pupils are equal, round, and reactive to light.  Cardiovascular:     Rate and Rhythm: Normal rate and regular rhythm.     Pulses: Normal pulses.     Heart sounds: Normal heart sounds.  Pulmonary:     Effort: Respiratory  distress present.     Breath sounds: Wheezing present.  Abdominal:     General: Abdomen is flat. Bowel sounds are normal.  Musculoskeletal:        General: Normal range of motion.     Cervical back: Normal range of motion.  Skin:    General: Skin is warm.  Neurological:     General: No focal deficit present.     Mental Status: He is alert. Mental status is at baseline.  Psychiatric:        Mood and Affect: Mood normal.       Labs on Admission: I have  personally reviewed the patients's labs and imaging studies.  Assessment/Plan Active Problems:   COPD exacerbation (HCC)   # Acute hypoxic respiratory failure most likely secondary to COPD exacerbation - Patient presented to ER and was discharged -No infiltrates - Continues to smoke half a pack of cigarettes a day  Plan: Scheduled DuoNebs Pulmicort  Brovana  Start azithromycin  5 days of prednisone   # History of A-fib-continue Eliquis , diltiazem , metoprolol   # Hyperlipidemia-continue Lipitor   # Hypertension-continue diltiazem , metoprolol , losartan      Admission status: Inpatient Med-Surg  Certification: The appropriate patient status for this patient is INPATIENT. Inpatient status is judged to be reasonable and necessary in order to provide the required intensity of service to ensure the patient's safety. The patient's presenting symptoms, physical exam findings, and initial radiographic and laboratory data in the context of their chronic comorbidities is felt to place them at high risk for further clinical deterioration. Furthermore, it is not anticipated that the patient will be medically stable for discharge from the hospital within 2 midnights of admission.   * I certify that at the point of admission it is my clinical judgment that the patient will require inpatient hospital care spanning beyond 2 midnights from the point of admission due to high intensity of service, high risk for further deterioration and high  frequency of surveillance required.DEWAINE Lamar Dess MD [facility] If 7PM-7AM, please contact night-coverage [email or web address]  01/28/2024, 4:09 AM

## 2024-01-28 NOTE — Discharge Summary (Signed)
   Pt left AMA.    Camellia Door, DO [facility]  CC: SOB x 1 week. HPI: Francisco [name] is a 63 y.o. male with medical history significant of CHF, COPD, CAD, hypertension who presents emergency department due to shortness of breath.  Patient was found to be hypoxic satting in the low 80s by EMS was given nebulizers as well as steroids..  In the ER he was given nebulizers with improvement in his breathing.  Patient wanted to be discharged left the hospital.  He subsequently developed worsening breathing and return for further evaluation.  On arrival he was hypoxic in the low 80s and hemodynamically stable.  Labs were obtained which showed BNP 98, troponin 17, WBC 16.4, hemoglobin 15.5, AST 19, ALT 60, pH 7.4.  Patient underwent chest x-ray which showed no acute findings.  Patient was admitted for further workup.   Significant Events: Admitted 01/27/2024 for COPD exacerbation   Admission Labs: WBC 13.4, HgB 14.7, plt 342 Na 142, K 4.5, CO2 of 30, BUN 17, Scr 1.12, glu 130 BNP 139 MRSA screen negative  Admission Imaging Studies: CXR No active cardiopulmonary disease.   Significant Labs:   Significant Imaging Studies:   Antibiotic Therapy: Anti-infectives (From admission, onward)    Start     Dose/Rate Route Frequency Ordered Stop   01/28/24 0515  azithromycin  (ZITHROMAX ) tablet 500 mg        500 mg Oral Daily 01/28/24 0418         Procedures:   Consultants:

## 2024-01-28 NOTE — Progress Notes (Signed)
 Patient off floor, was going home with friend. Dr Laurence came to floor after patient left floor. AMA  form was signed. Patient left after being told we had no wheel chair for him to use on his own. His friend is going to pick him up at the front door. Dr Laurence took picture of AMA form.

## 2024-01-29 ENCOUNTER — Encounter (HOSPITAL_COMMUNITY): Payer: Self-pay | Admitting: Internal Medicine

## 2024-01-29 DIAGNOSIS — Z881 Allergy status to other antibiotic agents status: Secondary | ICD-10-CM | POA: Diagnosis not present

## 2024-01-29 DIAGNOSIS — Z8261 Family history of arthritis: Secondary | ICD-10-CM | POA: Diagnosis not present

## 2024-01-29 DIAGNOSIS — Z7952 Long term (current) use of systemic steroids: Secondary | ICD-10-CM | POA: Diagnosis not present

## 2024-01-29 DIAGNOSIS — E872 Acidosis, unspecified: Secondary | ICD-10-CM | POA: Diagnosis not present

## 2024-01-29 DIAGNOSIS — Z23 Encounter for immunization: Secondary | ICD-10-CM | POA: Diagnosis not present

## 2024-01-29 DIAGNOSIS — Z955 Presence of coronary angioplasty implant and graft: Secondary | ICD-10-CM | POA: Diagnosis not present

## 2024-01-29 DIAGNOSIS — Z8673 Personal history of transient ischemic attack (TIA), and cerebral infarction without residual deficits: Secondary | ICD-10-CM | POA: Diagnosis not present

## 2024-01-29 DIAGNOSIS — Z823 Family history of stroke: Secondary | ICD-10-CM | POA: Diagnosis not present

## 2024-01-29 DIAGNOSIS — J441 Chronic obstructive pulmonary disease with (acute) exacerbation: Secondary | ICD-10-CM | POA: Diagnosis not present

## 2024-01-29 DIAGNOSIS — J9601 Acute respiratory failure with hypoxia: Secondary | ICD-10-CM | POA: Diagnosis not present

## 2024-01-29 DIAGNOSIS — I480 Paroxysmal atrial fibrillation: Secondary | ICD-10-CM | POA: Diagnosis not present

## 2024-01-29 DIAGNOSIS — B1920 Unspecified viral hepatitis C without hepatic coma: Secondary | ICD-10-CM | POA: Diagnosis not present

## 2024-01-29 DIAGNOSIS — Z885 Allergy status to narcotic agent status: Secondary | ICD-10-CM | POA: Diagnosis not present

## 2024-01-29 DIAGNOSIS — Z7901 Long term (current) use of anticoagulants: Secondary | ICD-10-CM | POA: Diagnosis not present

## 2024-01-29 DIAGNOSIS — Z1152 Encounter for screening for COVID-19: Secondary | ICD-10-CM | POA: Diagnosis not present

## 2024-01-29 DIAGNOSIS — R140 Abdominal distension (gaseous): Secondary | ICD-10-CM | POA: Diagnosis not present

## 2024-01-29 DIAGNOSIS — E785 Hyperlipidemia, unspecified: Secondary | ICD-10-CM | POA: Diagnosis not present

## 2024-01-29 DIAGNOSIS — Z79899 Other long term (current) drug therapy: Secondary | ICD-10-CM | POA: Diagnosis not present

## 2024-01-29 DIAGNOSIS — I2489 Other forms of acute ischemic heart disease: Secondary | ICD-10-CM | POA: Diagnosis not present

## 2024-01-29 DIAGNOSIS — F1510 Other stimulant abuse, uncomplicated: Secondary | ICD-10-CM | POA: Diagnosis not present

## 2024-01-29 DIAGNOSIS — Z8249 Family history of ischemic heart disease and other diseases of the circulatory system: Secondary | ICD-10-CM | POA: Diagnosis not present

## 2024-01-29 DIAGNOSIS — F17210 Nicotine dependence, cigarettes, uncomplicated: Secondary | ICD-10-CM | POA: Diagnosis not present

## 2024-01-29 DIAGNOSIS — R0602 Shortness of breath: Secondary | ICD-10-CM | POA: Diagnosis not present

## 2024-01-29 DIAGNOSIS — I110 Hypertensive heart disease with heart failure: Secondary | ICD-10-CM | POA: Diagnosis not present

## 2024-01-29 DIAGNOSIS — Z91199 Patient's noncompliance with other medical treatment and regimen due to unspecified reason: Secondary | ICD-10-CM | POA: Diagnosis not present

## 2024-01-29 DIAGNOSIS — I2510 Atherosclerotic heart disease of native coronary artery without angina pectoris: Secondary | ICD-10-CM | POA: Diagnosis not present

## 2024-01-29 DIAGNOSIS — I252 Old myocardial infarction: Secondary | ICD-10-CM | POA: Diagnosis not present

## 2024-01-29 DIAGNOSIS — I10 Essential (primary) hypertension: Secondary | ICD-10-CM | POA: Diagnosis not present

## 2024-01-29 DIAGNOSIS — I5042 Chronic combined systolic (congestive) and diastolic (congestive) heart failure: Secondary | ICD-10-CM | POA: Diagnosis not present

## 2024-01-29 DIAGNOSIS — Z7951 Long term (current) use of inhaled steroids: Secondary | ICD-10-CM | POA: Diagnosis not present

## 2024-01-29 DIAGNOSIS — Z419 Encounter for procedure for purposes other than remedying health state, unspecified: Secondary | ICD-10-CM | POA: Diagnosis not present

## 2024-01-29 LAB — CBC
HCT: 45.8 % (ref 39.0–52.0)
Hemoglobin: 14.5 g/dL (ref 13.0–17.0)
MCH: 31.3 pg (ref 26.0–34.0)
MCHC: 31.7 g/dL (ref 30.0–36.0)
MCV: 98.9 fL (ref 80.0–100.0)
Platelets: 314 K/uL (ref 150–400)
RBC: 4.63 MIL/uL (ref 4.22–5.81)
RDW: 14.2 % (ref 11.5–15.5)
WBC: 16 K/uL — ABNORMAL HIGH (ref 4.0–10.5)
nRBC: 0 % (ref 0.0–0.2)

## 2024-01-29 LAB — TROPONIN I (HIGH SENSITIVITY): Troponin I (High Sensitivity): 18 ng/L — ABNORMAL HIGH (ref <18)

## 2024-01-29 LAB — RESPIRATORY PANEL BY PCR

## 2024-01-29 LAB — RAPID URINE DRUG SCREEN, HOSP PERFORMED
Amphetamines: POSITIVE — AB
Barbiturates: NOT DETECTED
Benzodiazepines: NOT DETECTED
Cocaine: NOT DETECTED
Opiates: POSITIVE — AB
Tetrahydrocannabinol: NOT DETECTED

## 2024-01-29 LAB — COMPREHENSIVE METABOLIC PANEL WITH GFR
ALT: 37 U/L (ref 0–44)
AST: 17 U/L (ref 15–41)
Albumin: 3 g/dL — ABNORMAL LOW (ref 3.5–5.0)
Alkaline Phosphatase: 58 U/L (ref 38–126)
Anion gap: 9 (ref 5–15)
BUN: 23 mg/dL (ref 8–23)
CO2: 28 mmol/L (ref 22–32)
Calcium: 8 mg/dL — ABNORMAL LOW (ref 8.9–10.3)
Chloride: 103 mmol/L (ref 98–111)
Creatinine, Ser: 1.08 mg/dL (ref 0.61–1.24)
GFR, Estimated: >60 mL/min (ref >60)
Glucose, Bld: 190 mg/dL — ABNORMAL HIGH (ref 70–99)
Potassium: 5 mmol/L (ref 3.5–5.1)
Sodium: 140 mmol/L (ref 135–145)
Total Bilirubin: 0.7 mg/dL (ref 0.0–1.2)
Total Protein: 5.8 g/dL — ABNORMAL LOW (ref 6.5–8.1)

## 2024-01-29 LAB — I-STAT CG4 LACTIC ACID, ED: Lactic Acid, Venous: 1.6 mmol/L (ref 0.5–1.9)

## 2024-01-29 MED ORDER — AZITHROMYCIN 500 MG PO TABS
500.0000 mg | ORAL_TABLET | Freq: Every day | ORAL | Status: DC
  Administered 2024-01-30 – 2024-01-31 (×2): 500 mg via ORAL
  Filled 2024-01-29: qty 2
  Filled 2024-01-29: qty 1

## 2024-01-29 MED ORDER — LIDOCAINE 5 % EX PTCH
2.0000 | MEDICATED_PATCH | Freq: Every day | CUTANEOUS | Status: DC | PRN
  Filled 2024-01-29: qty 2

## 2024-01-29 MED ORDER — BENZONATATE 100 MG PO CAPS
200.0000 mg | ORAL_CAPSULE | Freq: Three times a day (TID) | ORAL | Status: DC | PRN
Start: 2024-01-29 — End: 2024-02-01
  Administered 2024-01-30: 200 mg via ORAL
  Filled 2024-01-29: qty 2

## 2024-01-29 MED ORDER — OXYCODONE HCL 5 MG PO TABS
5.0000 mg | ORAL_TABLET | Freq: Once | ORAL | Status: AC
  Administered 2024-01-29: 5 mg via ORAL
  Filled 2024-01-29: qty 1

## 2024-01-29 MED ORDER — SODIUM CHLORIDE 0.9 % IV SOLN
1.0000 g | INTRAVENOUS | Status: DC
  Administered 2024-01-29: 1 g via INTRAVENOUS
  Filled 2024-01-29: qty 10

## 2024-01-29 MED ORDER — GUAIFENESIN-DM 100-10 MG/5ML PO SYRP
15.0000 mL | ORAL_SOLUTION | Freq: Four times a day (QID) | ORAL | Status: DC
  Administered 2024-01-29 – 2024-02-01 (×10): 15 mL via ORAL
  Filled 2024-01-29 (×10): qty 15

## 2024-01-29 NOTE — Progress Notes (Addendum)
 PROGRESS NOTE    Francisco [name]  FMW:[id number] DOB: 06-19-1960 DOA: 01/28/2024 PCP: Heddy Barren, DO  Subjective: Pt seen and examined. C/o of back pain due to coughing. Left yesterday AMA. Came back within 12 hours due to SOB and cough. Pt now coughing up thick green sputum. Required Bipap in ER due to respiratory distress/failure. Remains on O2.    Hospital Course: CC: SOB. Patient bib [facility] from home with [facility]. He has been seen the past 3 days with shortness of breathe and it is continuously getting worse. EMS reports that he was 96% on room air on their arrival, hypertensive, and tachypneic. EMS reports rhonchi and wheezing and labored breathing. Patient states he has taken 5 duonebs at home today and they have not helped, ems gave one also and it didn't help.   HPI: Francisco [name] is a 63 y.o. male with medical history significant of atrial fibrillation on Eliquis , hyperlipidemia, CHF, COPD, CAD and essential hypertension presented emergency department complaining of worsening shortness of breath.  Patient was admitted yesterday 10/10 for asthma exacerbation however he wanted to be discharged earlier today and again coming tonight with worsening shortness of breath which is not improving using DuoNeb at home. Denies any chest pain, palpitation, headache and blurry vision. Leaving from the hospital patient has 3 cigarettes at home that worsening shortness of breath.  Patient denies any fever, chill,     ED Course:  At presentation to ED O2 sat dropped to 83% on room air currently 96% on 4 L oxygen .  Patient is still tachycardic tachypneic and borderline hypertensive after breathing treatment in the ED blood pressure has been improved to 123/82.  Heart rate also improved.   Lab work, CBC showing leukocytosis 18.9 in the setting of IV steroid treatment yesterday.  CMP unremarkable. Elevated troponin 20 and pending second troponin level. Elevated lactic acid level 3.6.  Slight elevated BNP  188. Chest x-ray no active disease process. EKG showing sinus tachycardia heart rate 110 and low voltage QRS.   In the ED patient received DuoNeb nebulizer, Ativan , mag sulfate 2 g, Solu-Medrol , morphine .   Patient also started on nitroglycerin  drip initially for concern for hypertension and CHF however before starting nitro drip patient blood pressure has been improved already with DuoNeb and steroid treatment as well as Ativan  and morphine .   Hospitalist has been consulted for further evaluation management of acute hypoxic respiratory failure in the setting of COPD exacerbation, lactic acidosis and elevated troponin from demand ischemia.  Significant Events: Admitted 01/28/2024 for COPD exacerbation with acute hypoxic respiratory failure.   Admission Labs: WBC 18.9, HgB 15.2, plt 334 BNP 188 Na 144, K 5.0, CO2 of 32, BUN 23, Scr 1.24, glu 164 T. Prot 6.3, alb 3.5, AST 22, ALT 45, alk phos 76, t. Bili 1.1  Admission Imaging Studies: CXR No active disease   Significant Labs:   Significant Imaging Studies:   Antibiotic Therapy: Anti-infectives (From admission, onward)    Start     Dose/Rate Route Frequency Ordered Stop   01/30/24 1000  azithromycin  (ZITHROMAX ) tablet 500 mg        500 mg Oral Daily 01/29/24 1037     01/29/24 1600  cefTRIAXone  (ROCEPHIN ) 1 g in sodium chloride  0.9 % 100 mL IVPB        1 g 200 mL/hr over 30 Minutes Intravenous Every 24 hours 01/29/24 1540     01/29/24 0500  azithromycin  (ZITHROMAX ) 500 mg in sodium chloride  0.9 % 250 mL IVPB  Status:  Discontinued        500 mg 250 mL/hr over 60 Minutes Intravenous Every 24 hours 01/28/24 2142 01/28/24 2216   01/29/24 0500  azithromycin  (ZITHROMAX ) 500 mg in sodium chloride  0.9 % 250 mL IVPB  Status:  Discontinued        500 mg 250 mL/hr over 60 Minutes Intravenous Every 24 hours 01/28/24 2216 01/29/24 1037       Procedures:   Consultants:     Assessment and Plan: * COPD with acute exacerbation  (HCC) 01/29/24 was placed on Bipap in ER due to respiratory distress. Now off bipap. Remains on O2. Pt showed me his green and purulent sputum. Start IV rocephin . Still with persistent wheezing/rhonchi. Continue with scheduled duoneb q6h. Continue with IV solumedrol q12. IMTS to take over pt's care tomorrow morning 7 AM. Add scheduled robitussin DM and prn tessalon  perles.   Acute hypoxic respiratory failure (HCC) 01/29/24 pt not on home O2. Continue supplemental O2. Wean as tolerated.   Elevated troponin-resolved as of 01/29/2024 01/29/24 no consistent with ACS, NSTEMI or demand ischemia. Resolved.   Continuous dependence on cigarette smoking 01/29/24 pt on nicotine  patch. He states Chantix gives him bad dreams.   Paroxysmal atrial fibrillation (HCC) 01/29/24 on Eliquis  bid, lopressor  and cardizem  cd. Admission EKG showed sinus tachycardia.   Hyperlipidemia 01/29/24 restart lipitor  40 mg daily   Combined systolic and diastolic congestive heart failure (HCC) 01/29/24 appears euvolemic. Chronic. Not exacerbated. Does not need diuresis at this point. Last echo April 2025 showed LVEF 60%   Essential hypertension 01/29/24 continue lopressor , cozaar  and cardizem  CD. Has prn hydralazine .   Methamphetamine use 01/29/24 check UDS. Do not prescribe opiates for his cough.   DVT prophylaxis: SCDs Start: 01/28/24 2143 apixaban  (ELIQUIS ) tablet 5 mg     Code Status: Full Code Family Communication: no family at bedside Disposition Plan: return home Reason for continuing need for hospitalization: remains on IV solumedrol and IV rocephin , supplemental O2.  Objective: Vitals:   01/29/24 1005 01/29/24 1246 01/29/24 1402 01/29/24 1516  BP: (!) 156/96 (!) 157/90  (!) 142/94  Pulse: 87 90  80  Resp: 19 17 20 20   Temp: 97.7 F (36.5 C) 97.8 F (36.6 C)  98.3 F (36.8 C)  TempSrc:  Oral  Oral  SpO2: 97%   96%  Weight:      Height:        Intake/Output Summary (Last 24 hours) at  01/29/2024 1625 Last data filed at 01/29/2024 1500 Gross per 24 hour  Intake 240 ml  Output --  Net 240 ml   Filed Weights   01/28/24 1947  Weight: 88.9 kg   Examination:  Physical Exam Vitals and nursing note reviewed.  Constitutional:      General: He is not in acute distress.    Appearance: He is not toxic-appearing or diaphoretic.  HENT:     Head: Normocephalic.  Eyes:     General: No scleral icterus. Cardiovascular:     Rate and Rhythm: Normal rate and regular rhythm.  Pulmonary:     Breath sounds: Examination of the right-middle field reveals wheezing and rhonchi. Examination of the left-middle field reveals wheezing and rhonchi. Examination of the right-lower field reveals wheezing and rhonchi. Examination of the left-lower field reveals wheezing and rhonchi. Wheezing and rhonchi present.     Comments: Green purulent sputum  Abdominal:     General: Bowel sounds are normal. There is no distension.     Palpations: Abdomen  is soft.  Musculoskeletal:     Right lower leg: No edema.     Left lower leg: No edema.  Skin:    General: Skin is warm and dry.     Capillary Refill: Capillary refill takes less than 2 seconds.  Neurological:     General: No focal deficit present.     Mental Status: He is alert and oriented to person, place, and time.    Data Reviewed: I have personally reviewed following labs and imaging studies  CBC: Recent Labs  Lab 01/27/24 0309 01/27/24 0322 01/27/24 2023 01/28/24 1950 01/28/24 2009 01/29/24 1211  WBC 16.4*  --  13.4* 18.9*  --  16.0*  NEUTROABS 10.6*  --   --  15.7*  --   --   HGB 15.5 15.3 14.7 15.2 16.0 14.5  HCT 48.4 45.0 45.3 48.7 47.0 45.8  MCV 97.6  --  97.2 100.8*  --  98.9  PLT 352  --  342 334  --  314   Basic Metabolic Panel: Recent Labs  Lab 01/27/24 0309 01/27/24 0322 01/27/24 2023 01/28/24 1950 01/28/24 2009 01/29/24 0117  NA 140 143 142 144 144 140  K 3.6 3.2* 4.5 5.0 5.0 5.0  CL 101  --  102 99 101 103   CO2 29  --  30 32  --  28  GLUCOSE 140*  --  130* 164* 158* 190*  BUN 14  --  17 23 36* 23  CREATININE 1.11  --  1.12 1.24 1.20 1.08  CALCIUM  9.0  --  9.0 8.9  --  8.0*  MG  --   --   --  2.5*  --   --    GFR: Estimated Creatinine Clearance: 76.8 mL/min (by C-G formula based on SCr of 1.08 mg/dL). Liver Function Tests: Recent Labs  Lab 01/27/24 0309 01/28/24 1950 01/29/24 0117  AST 19 22 17   ALT 60* 45* 37  ALKPHOS 91 76 58  BILITOT 0.9 1.1 0.7  PROT 6.4* 6.3* 5.8*  ALBUMIN  3.4* 3.5 3.0*   BNP (last 3 results) Recent Labs    01/27/24 0309 01/27/24 2023 01/28/24 1950  BNP 98.5 139.0* 188.1*   Sepsis Labs: Recent Labs  Lab 01/28/24 2009 01/29/24 0141  LATICACIDVEN 3.6* 1.6    Recent Results (from the past 240 hours)  MRSA Next Gen by PCR, Nasal     Status: None   Collection Time: 01/28/24  4:23 AM   Specimen: Nasal Mucosa; Nasal Swab  Result Value Ref Range Status   MRSA by PCR Next Gen NOT DETECTED NOT DETECTED Final    Comment: (NOTE) The GeneXpert MRSA Assay (FDA approved for NASAL specimens only), is one component of a comprehensive MRSA colonization surveillance program. It is not intended to diagnose MRSA infection nor to guide or monitor treatment for MRSA infections. Test performance is not FDA approved in patients less than 72 years old. Performed at [facility], [address] [place], [place] [address]     Radiology Studies: DG Chest Port 1 View Result Date: 01/28/2024 CLINICAL DATA:  Shortness of breath EXAM: PORTABLE CHEST 1 VIEW COMPARISON:  01/27/2024 FINDINGS: The heart size and mediastinal contours are within normal limits. Both lungs are clear. The visualized skeletal structures are unremarkable. No pneumothorax. IMPRESSION: No active disease. Electronically Signed   By: Franky Crease M.D.   On: 01/28/2024 20:35   DG Chest 2 View Result Date: 01/27/2024 CLINICAL DATA:  Shortness of breath EXAM:  CHEST - 2 VIEW COMPARISON:   01/27/2024 FINDINGS: Heart and mediastinal contours are within normal limits. No focal opacities or effusions. No acute bony abnormality. No pneumothorax. IMPRESSION: No active cardiopulmonary disease. Electronically Signed   By: Franky Crease M.D.   On: 01/27/2024 20:50   Scheduled Meds:  apixaban   5 mg Oral BID   atorvastatin   40 mg Oral Daily   [START ON 01/30/2024] azithromycin   500 mg Oral Daily   budesonide  (PULMICORT ) nebulizer solution  0.25 mg Nebulization BID   diltiazem   120 mg Oral Daily   guaiFENesin -dextromethorphan   15 mL Oral QID   ipratropium-albuterol   3 mL Nebulization Q6H   loratadine   10 mg Oral Daily   losartan   50 mg Oral Daily   methylPREDNISolone  (SOLU-MEDROL ) injection  40 mg Intravenous Q12H   metoprolol  tartrate  25 mg Oral BID   nicotine   14 mg Transdermal Daily   pantoprazole   40 mg Oral Daily   sodium chloride  flush  3 mL Intravenous Q12H   sodium chloride  flush  3 mL Intravenous Q12H   Continuous Infusions:  sodium chloride      cefTRIAXone  (ROCEPHIN )  IV      LOS: 0 days   Time spent: 55 minutes  Camellia Door, DO  [facility]  01/29/2024, 4:25 PM

## 2024-01-29 NOTE — Plan of Care (Signed)
 Patient is doing much better currently after 6 hours of BiPAP.  Currently has been transition to 3 L of nasal cannula oxygen .  O2 sat maintaining well.  He is complaining about back pain.  Giving patient one-time dose of oral oxycodone .

## 2024-01-29 NOTE — Progress Notes (Signed)
 Brought Bipap to room as pt requested. He was asleep but woke up and said he didn't want to wear it. Bipap in room

## 2024-01-29 NOTE — Assessment & Plan Note (Addendum)
 01/29/24 appears euvolemic. Chronic. Not exacerbated. Does not need diuresis at this point. Last echo April 2025 showed LVEF 60%

## 2024-01-29 NOTE — ED Notes (Signed)
 This phlebotomist stuck patient to obtain labs. Unable to collect RN aware

## 2024-01-29 NOTE — Assessment & Plan Note (Addendum)
 01/29/24 pt on nicotine  patch. He states Chantix gives him bad dreams.

## 2024-01-29 NOTE — Assessment & Plan Note (Addendum)
 01/29/24 continue lopressor , cozaar  and cardizem  CD. Has prn hydralazine .

## 2024-01-29 NOTE — Assessment & Plan Note (Addendum)
 01/29/24 check UDS. Do not prescribe opiates for his cough.

## 2024-01-29 NOTE — ED Notes (Signed)
 [place] sandwhich, soda and crackers given to pt. PT alert and oriented at this time

## 2024-01-29 NOTE — ED Notes (Signed)
 Patient got up to walk to bathroom  and upon coming back to bed patient was very sob

## 2024-01-29 NOTE — Assessment & Plan Note (Addendum)
 01/29/24 on Eliquis  bid, lopressor  and cardizem  cd. Admission EKG showed sinus tachycardia.

## 2024-01-29 NOTE — Subjective & Objective (Signed)
 Pt seen and examined. C/o of back pain due to coughing. Left yesterday AMA. Came back within 12 hours due to SOB and cough. Pt now coughing up thick green sputum. Required Bipap in ER due to respiratory distress/failure. Remains on O2.

## 2024-01-29 NOTE — Assessment & Plan Note (Addendum)
 01/29/24 pt not on home O2. Continue supplemental O2. Wean as tolerated.

## 2024-01-29 NOTE — Hospital Course (Signed)
 CC: SOB. Patient bib [facility] from home with [facility]. He has been seen the past 3 days with shortness of breathe and it is continuously getting worse. EMS reports that he was 96% on room air on their arrival, hypertensive, and tachypneic. EMS reports rhonchi and wheezing and labored breathing. Patient states he has taken 5 duonebs at home today and they have not helped, ems gave one also and it didn't help.   HPI: Francisco [name] is a 63 y.o. male with medical history significant of atrial fibrillation on Eliquis , hyperlipidemia, CHF, COPD, CAD and essential hypertension presented emergency department complaining of worsening shortness of breath.  Patient was admitted yesterday 10/10 for asthma exacerbation however he wanted to be discharged earlier today and again coming tonight with worsening shortness of breath which is not improving using DuoNeb at home. Denies any chest pain, palpitation, headache and blurry vision. Leaving from the hospital patient has 3 cigarettes at home that worsening shortness of breath.  Patient denies any fever, chill,     ED Course:  At presentation to ED O2 sat dropped to 83% on room air currently 96% on 4 L oxygen .  Patient is still tachycardic tachypneic and borderline hypertensive after breathing treatment in the ED blood pressure has been improved to 123/82.  Heart rate also improved.   Lab work, CBC showing leukocytosis 18.9 in the setting of IV steroid treatment yesterday.  CMP unremarkable. Elevated troponin 20 and pending second troponin level. Elevated lactic acid level 3.6.  Slight elevated BNP 188. Chest x-ray no active disease process. EKG showing sinus tachycardia heart rate 110 and low voltage QRS.   In the ED patient received DuoNeb nebulizer, Ativan , mag sulfate 2 g, Solu-Medrol , morphine .   Patient also started on nitroglycerin  drip initially for concern for hypertension and CHF however before starting nitro drip patient blood pressure has been improved  already with DuoNeb and steroid treatment as well as Ativan  and morphine .   Hospitalist has been consulted for further evaluation management of acute hypoxic respiratory failure in the setting of COPD exacerbation, lactic acidosis and elevated troponin from demand ischemia.  Significant Events: Admitted 01/28/2024 for COPD exacerbation with acute hypoxic respiratory failure.   Admission Labs: WBC 18.9, HgB 15.2, plt 334 BNP 188 Na 144, K 5.0, CO2 of 32, BUN 23, Scr 1.24, glu 164 T. Prot 6.3, alb 3.5, AST 22, ALT 45, alk phos 76, t. Bili 1.1  Admission Imaging Studies: CXR No active disease   Significant Labs:   Significant Imaging Studies:   Antibiotic Therapy: Anti-infectives (From admission, onward)    Start     Dose/Rate Route Frequency Ordered Stop   01/30/24 1000  azithromycin  (ZITHROMAX ) tablet 500 mg        500 mg Oral Daily 01/29/24 1037     01/29/24 1600  cefTRIAXone  (ROCEPHIN ) 1 g in sodium chloride  0.9 % 100 mL IVPB        1 g 200 mL/hr over 30 Minutes Intravenous Every 24 hours 01/29/24 1540     01/29/24 0500  azithromycin  (ZITHROMAX ) 500 mg in sodium chloride  0.9 % 250 mL IVPB  Status:  Discontinued        500 mg 250 mL/hr over 60 Minutes Intravenous Every 24 hours 01/28/24 2142 01/28/24 2216   01/29/24 0500  azithromycin  (ZITHROMAX ) 500 mg in sodium chloride  0.9 % 250 mL IVPB  Status:  Discontinued        500 mg 250 mL/hr over 60 Minutes Intravenous Every 24 hours 01/28/24  2216 01/29/24 1037       Procedures:   Consultants:

## 2024-01-29 NOTE — Plan of Care (Signed)
  Problem: Clinical Measurements: Goal: Ability to maintain clinical measurements within normal limits will improve Outcome: Progressing Goal: Diagnostic test results will improve Outcome: Progressing   Problem: Nutrition: Goal: Adequate nutrition will be maintained Outcome: Progressing   Problem: Pain Managment: Goal: General experience of comfort will improve and/or be controlled Outcome: Progressing   Problem: Respiratory: Goal: Levels of oxygenation will improve Outcome: Progressing Goal: Ability to maintain adequate ventilation will improve Outcome: Progressing

## 2024-01-29 NOTE — Assessment & Plan Note (Addendum)
 01/29/24 restart lipitor  40 mg daily

## 2024-01-29 NOTE — Assessment & Plan Note (Addendum)
 01/29/24 was placed on Bipap in ER due to respiratory distress. Now off bipap. Remains on O2. Pt showed me his green and purulent sputum. Start IV rocephin . Still with persistent wheezing/rhonchi. Continue with scheduled duoneb q6h. Continue with IV solumedrol q12. IMTS to take over pt's care tomorrow morning 7 AM. Add scheduled robitussin DM and prn tessalon  perles.

## 2024-01-29 NOTE — Assessment & Plan Note (Addendum)
 01/29/24 no consistent with ACS, NSTEMI or demand ischemia. Resolved.

## 2024-01-30 ENCOUNTER — Other Ambulatory Visit: Payer: Self-pay

## 2024-01-30 ENCOUNTER — Inpatient Hospital Stay (HOSPITAL_COMMUNITY)

## 2024-01-30 DIAGNOSIS — F17210 Nicotine dependence, cigarettes, uncomplicated: Secondary | ICD-10-CM

## 2024-01-30 DIAGNOSIS — J441 Chronic obstructive pulmonary disease with (acute) exacerbation: Secondary | ICD-10-CM | POA: Diagnosis not present

## 2024-01-30 DIAGNOSIS — Z7952 Long term (current) use of systemic steroids: Secondary | ICD-10-CM

## 2024-01-30 DIAGNOSIS — I10 Essential (primary) hypertension: Secondary | ICD-10-CM | POA: Diagnosis not present

## 2024-01-30 DIAGNOSIS — I480 Paroxysmal atrial fibrillation: Secondary | ICD-10-CM

## 2024-01-30 DIAGNOSIS — Z7951 Long term (current) use of inhaled steroids: Secondary | ICD-10-CM

## 2024-01-30 DIAGNOSIS — Z79899 Other long term (current) drug therapy: Secondary | ICD-10-CM

## 2024-01-30 LAB — BLOOD GAS, VENOUS
Acid-Base Excess: 7.1 mmol/L — ABNORMAL HIGH (ref 0.0–2.0)
Bicarbonate: 36.1 mmol/L — ABNORMAL HIGH (ref 20.0–28.0)
O2 Saturation: 84.2 %
Patient temperature: 36.6
pCO2, Ven: 69 mmHg — ABNORMAL HIGH (ref 44–60)
pH, Ven: 7.33 (ref 7.25–7.43)
pO2, Ven: 46 mmHg — ABNORMAL HIGH (ref 32–45)

## 2024-01-30 LAB — GLUCOSE, CAPILLARY: Glucose-Capillary: 129 mg/dL — ABNORMAL HIGH (ref 70–99)

## 2024-01-30 LAB — TROPONIN I (HIGH SENSITIVITY): Troponin I (High Sensitivity): 15 ng/L (ref <18)

## 2024-01-30 LAB — SARS CORONAVIRUS 2 BY RT PCR: SARS Coronavirus 2 by RT PCR: NEGATIVE

## 2024-01-30 MED ORDER — NITROGLYCERIN 0.4 MG SL SUBL
0.4000 mg | SUBLINGUAL_TABLET | SUBLINGUAL | Status: DC | PRN
Start: 2024-01-30 — End: 2024-02-01

## 2024-01-30 MED ORDER — INFLUENZA VIRUS VACC SPLIT PF (FLUZONE) 0.5 ML IM SUSY
0.5000 mL | PREFILLED_SYRINGE | INTRAMUSCULAR | Status: AC
  Administered 2024-02-01: 0.5 mL via INTRAMUSCULAR
  Filled 2024-01-30: qty 0.5

## 2024-01-30 MED ORDER — ACETAMINOPHEN 500 MG PO TABS
1000.0000 mg | ORAL_TABLET | Freq: Four times a day (QID) | ORAL | Status: DC
  Administered 2024-01-30 – 2024-01-31 (×3): 1000 mg via ORAL
  Filled 2024-01-30 (×6): qty 2

## 2024-01-30 MED ORDER — HYDROXYZINE HCL 25 MG PO TABS
25.0000 mg | ORAL_TABLET | Freq: Once | ORAL | Status: AC
  Administered 2024-01-30: 25 mg via ORAL
  Filled 2024-01-30: qty 1

## 2024-01-30 MED ORDER — AMOXICILLIN-POT CLAVULANATE 875-125 MG PO TABS
1.0000 | ORAL_TABLET | Freq: Two times a day (BID) | ORAL | Status: DC
  Administered 2024-01-30 – 2024-02-01 (×5): 1 via ORAL
  Filled 2024-01-30 (×6): qty 1

## 2024-01-30 MED ORDER — IPRATROPIUM-ALBUTEROL 0.5-2.5 (3) MG/3ML IN SOLN
3.0000 mL | Freq: Three times a day (TID) | RESPIRATORY_TRACT | Status: DC
Start: 2024-01-30 — End: 2024-01-31
  Administered 2024-01-30 – 2024-01-31 (×2): 3 mL via RESPIRATORY_TRACT
  Filled 2024-01-30 (×2): qty 3

## 2024-01-30 MED ORDER — DICLOFENAC SODIUM 1 % EX GEL
4.0000 g | Freq: Four times a day (QID) | CUTANEOUS | Status: DC
  Administered 2024-01-30 (×3): 4 g via TOPICAL
  Filled 2024-01-30: qty 100

## 2024-01-30 MED ORDER — LIDOCAINE 5 % EX PTCH
1.0000 | MEDICATED_PATCH | CUTANEOUS | Status: DC
  Administered 2024-01-30: 1 via TRANSDERMAL
  Filled 2024-01-30 (×2): qty 1

## 2024-01-30 MED ORDER — OXYCODONE HCL 5 MG PO TABS
5.0000 mg | ORAL_TABLET | Freq: Four times a day (QID) | ORAL | Status: AC | PRN
Start: 2024-01-30 — End: 2024-01-31
  Administered 2024-01-30 (×3): 5 mg via ORAL
  Filled 2024-01-30 (×4): qty 1

## 2024-01-30 MED ORDER — BUDESON-GLYCOPYRROL-FORMOTEROL 160-9-4.8 MCG/ACT IN AERO
2.0000 | INHALATION_SPRAY | Freq: Two times a day (BID) | RESPIRATORY_TRACT | Status: DC
  Administered 2024-01-30 – 2024-02-01 (×4): 2 via RESPIRATORY_TRACT
  Filled 2024-01-30 (×2): qty 5.9

## 2024-01-30 MED ORDER — PREDNISONE 20 MG PO TABS
40.0000 mg | ORAL_TABLET | Freq: Every day | ORAL | Status: DC
  Administered 2024-01-31 – 2024-02-01 (×2): 40 mg via ORAL
  Filled 2024-01-30 (×2): qty 2

## 2024-01-30 NOTE — Hospital Course (Signed)
 Francisco [name] is a 63 y.o. with a pertinent PMH of 63 year old with COPD (no baseline O2), HFpEF, CAD, tobacco and IV amphetamine use disorder, and pAfib presented for SOB with hypoxic respiratory failure and admitted for COPD exacerbation, treated with steroids and antibiotics due to increased sputum production, improving.    Plan: Acute Hypoxic Respiratory Failure 2/2 COPD with acute exacerbation Presented with continued, worsening SOB. Afebrile, but hypertensive and tachypneic on arrival to ED with rhonchi and labored breathing. Patient took multiple DuoNeb treatments at home prior to admission that did not help. In ED, steroids and IV antibiotics given for increased dark green sputum production. O2 saturations were 83% on room air and uptitrated to 96% on 4L [place]. Leukocytosis on admission to 18.9, improved on discharge to 15.4. Elevated lactic acid that peaked to 3.6, responsive to fluids. CXR showed no active disease. COVID, RPP negative. Needs updated flu vaccine, already received RSV and pneumonia vaccine. Patient remained on 2-3L of [place] while admitted, remaining at goal above 90% for COPD. Continued on Duonebs q4h and was resumed triple therapy inhaler. Previously started on Prednisone  40mg  on 10/6 for sputum production during a previous encounter, started taper at discharge. Continued PO Azithromycin  500mg  x 3 days, completed on 10/14, and PO Augmentin  875-125mg , to be completed outpatient on 10/16. Patient had continued tremulousness during admission, likely in the setting of duonebs, albuterol  use frequency (given inhaler found in patient's hand while sleeping), nicotine  patch, and potential withdrawal symptoms from IV methamphetamine use. Additionally, patient maintained subjective shortness of breath despite inhalers and nebulizer, possibly due to underlying anxiety. Will discharge with antioxlytics.  Ambulated patient with pulse ox, where he maintained 88% O2 saturation while ambulating on room air,  qualifying for home oxygen .    Abdominal Distension Patient reported bloating and diffuse discomfort across the abdomen since admission. Felt as though he could not sit up because his abdomen was too tight. Last BM day of discharge, able to tolerate diet without nausea or vomiting.  KUB showed no abdominal pathology potentially contributing to SOB. Gave patient bowel regimen of Miralax  and Sennokot.     Acute hypoxic respiratory failure  Due to COPD exacerbation with pneumonia. Not from volume overload or PE. Not on home oxygen . Weaned O2 to room air as able. Ambulatory SpO2 to determine outpatient oxygen  need, of which he needs at discharge. Orders were placed for patient to have home oxygen .   CAD S/P percutaneous coronary angioplasty Stable, with some mild myocardial demand ischemia on admission that has resolved. Outpatient statin resumed.   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 5 days since last use. Ongoing use despite prior quit attempts, adverse health and psychosocial effects. Motivated to quit. UDS positive for amphetamines and opiates. Patient needs updated Hep A/B vaccine. Scheduled follow up for Hepatitis C treatment with Dr. Eben at [facility] clinic   Essential hypertension Chronic, poor control. Resumed outpatient diltiazem  and losartan .   History of Back pain Acute thoracic back pain. Worsened with cough. With history of chronic lumbar back pain and 4 prior surgeries. With IVDU, but no fevers or worrisome physical exam findings. Deferring imaging. Multimodal pain management strategy per below. Avoid NSAIDs given CAD with stent. Pain management added, including Acetaminophen  1000 mg q6 h, Lidocaine  patches + voltaren  gel, Heating pads, and Oxycodone  5 mg q6 h for severe pain.   Combined systolic and diastolic congestive heart failure (HCC) Chronic, stable. Recent echo shows EF 60-65%, recovered from prior ~30% in  2021. Euvolemic. Outpatient therapy  resumed, including Losartan  50 mg daily and Metoprolol  tartrate 25 mg bid.   Hyperlipidemia Chronic. Outpatient statin resumed.   Paroxysmal atrial fibrillation  Sinus rhythm at present. Outpatient therapies resumed as above, including Eliquis  5mg  BID.    Tobacco Use Disorder 1/2 ppd, down from 2 ppd previously. Motivated to quit. Patient found smoking cigarettes in bathroom overnight. Held nicotine  patch while inpatient.

## 2024-01-30 NOTE — Discharge Instructions (Signed)
 [facility]

## 2024-01-30 NOTE — Progress Notes (Signed)
 Interval history 63 year old with COPD old with COPD, chronic HFpEF, CAD, tobacco and IV amphetamine use disorder, admitted 1 day ago for SOB with hypoxic respiratory failure, treated for COPD exacerbation with steroids and antibiotics due to increased sputum production, improving. Transferred to IMTS because he is an IMTS patient.  Breathing feels about the same, maybe somewhat better today. Still requires oxygen  to maintain SpO2 > 88%. Notes failed trial to get outpatient oxygen  because SpO2 was ~89% but reports that sometimes when he wakes up in the morning he's in the 70s based on home pulse oximeter. Cough continues to be productive of dark sputum, increased from his usual. He's having back pain today, located mid-back, exacerbated by coughing. No fevers. Notes no increased swelling.  Physical exam Blood pressure (!) 167/51, pulse 83, temperature (!) 97.1 F (36.2 C), temperature source Oral, resp. rate (!) 22, height 6' (1.829 m), weight 92.2 kg, SpO2 98%.  No distress, upright on edge of bed eating breakfast Moist pink oral mucosa, some pharyngeal erythema noted Heart rate normal, rhythm regular, no murmurs, no JVD while upright, no LE edema Breathing rate is somewhat fast, speaking full sentences, coarse crackles and some wheezing throughout all lung fields Skin warm and dry with scattered purpura on arms No spinal tenderness, warmth, erythema Abdomen is non-tender, somewhat tight Alert and oriented, speech is normal, no facial asymmetry, moving extremities normally  Assessment and plan Hospital day 1  Francisco [name] is a 63 y.o. with COPD, chronic HFpEF, CAD with history of stent, admitted for COPD exacerbation with hypoxic respiratory failure.  Principal Problem:   COPD with acute exacerbation (HCC) Improving. Steroids and antibiotics for increased dark sputum production. Started prednisone  on 10/6 for this during previous encounter, will need taper. Continues to smoke but  trying to quit. Check for COVID-19. Give flu vaccine. Has already received RSV and pneumonia vaccine. -prednisone  40 mg daily, start taper on discharge -duoneb q6 h -resume outpatient LAMA/LABA/ICS -augmentin  + azithromycin  through 10/16  Active Problems:   Acute hypoxic respiratory failure (HCC) Due to COPD exacerbation with pneumonia. Not from volume overload or PE. Not on home oxygen . Wean O2 to room air as able. Ambulatory SpO2 to determine outpatient oxygen  need.    CAD S/P percutaneous coronary angioplasty Stable, with some mild myocardial demand ischemia on admission that has resolved. Outpatient statin resumed.    Methamphetamine use Via IV route. Chronic with dependence. Ongoing use despite prior quit attempts, adverse health and psychosocial effects. Motivated to quit.    Essential hypertension Chronic, poor control. Resumed outpatient diltiazem  and losartan .    Back pain Acute thoracic back pain. Worse with cough. With history of chronic lumbar back pain and prior surgeries. With IVDU, but no fevers or worrisome physical exam findings. Deferring imaging. Multimodal pain management strategy per below. Avoid NSAIDs given CAD with stent. 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 Recent echo shows EF 60-65%, recovered from prior ~30% in 2021. I don't think he's volume overloaded. Outpatient therapy resumed. -losartan  50 mg daily -metoprolol  tartrate 25 mg bid    Hyperlipidemia Chronic. Outpatient statin resumed.    Paroxysmal atrial fibrillation (HCC) Sinus rhythm at present. Outpatient therapies resumed including DOAC. -metoprolol  per above -diltiazem  120 mg daily    Continuous dependence on  cigarette smoking 1/2 ppd, down from 2 ppd previously. Motivated to quit. Nicotine  replacement for now.  Resolved Problems:    History of CHF (congestive heart failure)   Elevated troponin  VTE prophylaxis: SCDs Start: 01/28/24 2143, Eliquis  for a-fib Diet: heart heatlhy  Code: full but wouldn't want indefinite dependence on mechanical ventilation  Discharge plan: pending treatment of COPD exacerbation, determination of outpatient supplemental oxygen  needs.   Francisco Kung MD 01/30/2024, 8:03 AM  Pager: [phone]

## 2024-01-30 NOTE — Progress Notes (Signed)
 Pt is alert orient no distress, on 3L 02, Lunch transported with him. Sherrilyn the receiving Nurse given report. Pt walked to bathroom and had to sit for a while. Nurse aware.

## 2024-01-30 NOTE — Telephone Encounter (Signed)
 Pt currently admitted  Routing to Dr. Annella just as FYI

## 2024-01-30 NOTE — Progress Notes (Signed)
 Patient safely received into room 5C04. Oriented and alert. Ambulated to the bathroom independently. On 3L of oxygen . No distress noted or verbalized.  Call light within reach, safety measures in place. Patient eating his lunch.

## 2024-01-30 NOTE — Telephone Encounter (Signed)
 Can we try PA again with diagnosis of asthma? My note documents asthma as diagnosis.

## 2024-01-30 NOTE — Progress Notes (Signed)
 Patient called multiple times, stating unable to breath nauseated and dizzy, appeared diaphoretic, afebrile upon assessment. Sats 100 on 3L.  MD,RT and charge notified.  EKG done per orders - NSR.   Patient called a second time with complaints of shortness of breath. MD notified, assessment done, patient stable on 2L. MD orderded a transfer to PCU for close monitoring. POC

## 2024-01-30 NOTE — TOC Initial Note (Signed)
 Transition of Care [facility]) - Initial/Assessment Note    Patient Details  Name: Francisco [name] MRN: [id number] Date of Birth: 1960/11/10  [facility]) CM/SW Contact:    Lauraine FORBES Saa, LCSWA Phone Number: 01/30/2024, 3:39 PM  Clinical Narrative:                  3:39 PM CSW introduced self and role to patient. Patient confirmed he resides at home alone where he does not use DME. Patient expressed interest in transportation resources. CSW provided patient SDOH (transportation) resources. Patient confirmed that he does not have SNF or HH history. Per chart review, patient has DME (oxygen , rollator, nebulizer) history with [employer]. Patient has a PCP and insurance. [name] preferred pharmacy's are [facility], [facility], [facility], and [place] [address] [address]. No TOC needs identified at this time. TOC will continue to follow.  Expected Discharge Plan: Home/Self Care Barriers to Discharge: Continued Medical Work up   Patient Goals and CMS Choice Patient states their goals for this hospitalization and ongoing recovery are:: to return home          Expected Discharge Plan and Services       Living arrangements for the past 2 months: Single Family Home                                      Prior Living Arrangements/Services Living arrangements for the past 2 months: Single Family Home Lives with:: Self Patient language and need for interpreter reviewed:: Yes Do you feel safe going back to the place where you live?: Yes      Need for Family Participation in Patient Care: No (Comment) Care giver support system in place?: No (comment)   Criminal Activity/Legal Involvement Pertinent to Current Situation/Hospitalization: No - Comment as needed  Activities of Daily Living      Permission Sought/Granted Permission sought to share information with : Family Supports Permission granted to  share information with : No (Contact information on chart)  Share Information with NAME: [place]     Permission granted to share info w Relationship: Father  Permission granted to share info w Contact Information: [phone]  Emotional Assessment Appearance:: Appears stated age Attitude/Demeanor/Rapport: Engaged Affect (typically observed): Accepting, Adaptable, Appropriate, Calm, Stable, Pleasant Orientation: : Oriented to Self, Oriented to Place, Oriented to  Time, Oriented to Situation Alcohol / Substance Use: Not Applicable Psych Involvement: No (comment)  Admission diagnosis:  COPD exacerbation (HCC) [J44.1] COPD with acute exacerbation (HCC) [J44.1] Dyspnea, unspecified type [R06.00] Patient Active Problem List   Diagnosis Date Noted   Acute hypoxic respiratory failure (HCC) 10/30/2023   PAF (paroxysmal atrial fibrillation) (HCC) 08/13/2023   COPD with acute exacerbation (HCC) 08/13/2023   Seasonal allergies 07/29/2023   Hyperlipidemia 04/29/2023   Benzodiazepine causing adverse effect in therapeutic use 02/14/2023   Pulmonary hypertension (HCC) 02/05/2023   Fatigue 09/28/2022   Right knee pain 08/26/2022   Weight loss 02/19/2022   Combined systolic and diastolic congestive heart failure (HCC) 08/13/2021   Lung nodules 06/24/2021   Hepatitis C 06/24/2021   Back pain 06/24/2021   Anxiety state 05/01/2021   Essential hypertension 04/26/2021   Continuous dependence on cigarette smoking 01/16/2021   Methamphetamine use 08/17/2019   COPD (chronic obstructive pulmonary disease) (HCC)    Dilated cardiomyopathy (HCC)    CAD S/P percutaneous  coronary angioplasty    PCP:  Heddy Barren, DO Pharmacy:   [place] [facility] [address], [address] [place] [address] Phone: [phone] Fax: [phone]  [id number] [place], [place] - [address] AT [facility] OF [address] [place] [address] Phone: [phone] Fax: [phone]  [facility] [address] [place] [address] Phone: [phone] Fax: [phone]  [facility] [facility] [address] [place] [address] Phone: [phone] Fax: [phone]     Social Drivers of Health (SDOH) Social History: SDOH Screenings   Food Insecurity: No Food Insecurity (01/29/2024)  Housing: Unknown (01/29/2024)  Transportation Needs: No Transportation Needs (01/29/2024)  Utilities: Not At Risk (01/29/2024)  Alcohol Screen: Low Risk  (02/07/2023)  Depression (PHQ2-9): High Risk (11/21/2023)  Financial Resource Strain: High Risk (08/24/2023)   Received from [facility]  Social Connections: Unknown (08/14/2023)  Tobacco Use: High Risk (01/28/2024)  Health Literacy: Adequate Health Literacy (11/21/2023)   SDOH Interventions:     Readmission Risk Interventions     No data to display

## 2024-01-30 NOTE — Plan of Care (Signed)

## 2024-01-31 ENCOUNTER — Encounter: Admitting: Infectious Diseases

## 2024-01-31 ENCOUNTER — Telehealth: Payer: Self-pay | Admitting: Pharmacist

## 2024-01-31 ENCOUNTER — Inpatient Hospital Stay (HOSPITAL_COMMUNITY)

## 2024-01-31 DIAGNOSIS — Z955 Presence of coronary angioplasty implant and graft: Secondary | ICD-10-CM

## 2024-01-31 DIAGNOSIS — I5042 Chronic combined systolic (congestive) and diastolic (congestive) heart failure: Secondary | ICD-10-CM

## 2024-01-31 DIAGNOSIS — R140 Abdominal distension (gaseous): Secondary | ICD-10-CM | POA: Diagnosis not present

## 2024-01-31 DIAGNOSIS — Z7952 Long term (current) use of systemic steroids: Secondary | ICD-10-CM | POA: Diagnosis not present

## 2024-01-31 DIAGNOSIS — I2510 Atherosclerotic heart disease of native coronary artery without angina pectoris: Secondary | ICD-10-CM

## 2024-01-31 DIAGNOSIS — J441 Chronic obstructive pulmonary disease with (acute) exacerbation: Secondary | ICD-10-CM | POA: Diagnosis not present

## 2024-01-31 DIAGNOSIS — I110 Hypertensive heart disease with heart failure: Secondary | ICD-10-CM

## 2024-01-31 DIAGNOSIS — E785 Hyperlipidemia, unspecified: Secondary | ICD-10-CM

## 2024-01-31 DIAGNOSIS — Z79899 Other long term (current) drug therapy: Secondary | ICD-10-CM | POA: Diagnosis not present

## 2024-01-31 DIAGNOSIS — F1590 Other stimulant use, unspecified, uncomplicated: Secondary | ICD-10-CM

## 2024-01-31 LAB — BASIC METABOLIC PANEL WITH GFR
Anion gap: 9 (ref 5–15)
BUN: 33 mg/dL — ABNORMAL HIGH (ref 8–23)
CO2: 31 mmol/L (ref 22–32)
Calcium: 8.4 mg/dL — ABNORMAL LOW (ref 8.9–10.3)
Chloride: 100 mmol/L (ref 98–111)
Creatinine, Ser: 1.28 mg/dL — ABNORMAL HIGH (ref 0.61–1.24)
GFR, Estimated: >60 mL/min (ref >60)
Glucose, Bld: 118 mg/dL — ABNORMAL HIGH (ref 70–99)
Potassium: 5.1 mmol/L (ref 3.5–5.1)
Sodium: 140 mmol/L (ref 135–145)

## 2024-01-31 LAB — CBC
HCT: 47.2 % (ref 39.0–52.0)
Hemoglobin: 14.7 g/dL (ref 13.0–17.0)
MCH: 31.3 pg (ref 26.0–34.0)
MCHC: 31.1 g/dL (ref 30.0–36.0)
MCV: 100.4 fL — ABNORMAL HIGH (ref 80.0–100.0)
Platelets: 271 K/uL (ref 150–400)
RBC: 4.7 MIL/uL (ref 4.22–5.81)
RDW: 13.8 % (ref 11.5–15.5)
WBC: 16.2 K/uL — ABNORMAL HIGH (ref 4.0–10.5)
nRBC: 0 % (ref 0.0–0.2)

## 2024-01-31 LAB — BRAIN NATRIURETIC PEPTIDE: B Natriuretic Peptide: 96.2 pg/mL (ref 0.0–100.0)

## 2024-01-31 LAB — TROPONIN I (HIGH SENSITIVITY): Troponin I (High Sensitivity): 16 ng/L (ref <18)

## 2024-01-31 MED ORDER — IPRATROPIUM-ALBUTEROL 0.5-2.5 (3) MG/3ML IN SOLN
3.0000 mL | RESPIRATORY_TRACT | Status: DC | PRN
  Administered 2024-01-31 – 2024-02-01 (×4): 3 mL via RESPIRATORY_TRACT
  Filled 2024-01-31 (×3): qty 3

## 2024-01-31 MED ORDER — SENNOSIDES-DOCUSATE SODIUM 8.6-50 MG PO TABS
1.0000 | ORAL_TABLET | Freq: Once | ORAL | Status: DC
  Filled 2024-01-31: qty 1

## 2024-01-31 MED ORDER — IPRATROPIUM-ALBUTEROL 0.5-2.5 (3) MG/3ML IN SOLN
3.0000 mL | Freq: Four times a day (QID) | RESPIRATORY_TRACT | Status: DC
  Administered 2024-01-31 – 2024-02-01 (×3): 3 mL via RESPIRATORY_TRACT
  Filled 2024-01-31 (×3): qty 3

## 2024-01-31 MED ORDER — CARMEX CLASSIC LIP BALM EX OINT
TOPICAL_OINTMENT | CUTANEOUS | Status: DC | PRN
  Filled 2024-01-31: qty 10

## 2024-01-31 MED ORDER — METHOCARBAMOL 500 MG PO TABS: 500.0000 mg | ORAL_TABLET | Freq: Once | ORAL | Status: DC

## 2024-01-31 MED ORDER — POLYETHYLENE GLYCOL 3350 17 G PO PACK
17.0000 g | PACK | Freq: Every day | ORAL | Status: DC
  Filled 2024-01-31 (×2): qty 1

## 2024-01-31 MED ORDER — SALINE SPRAY 0.65 % NA SOLN
1.0000 | NASAL | Status: DC | PRN
  Filled 2024-01-31: qty 44

## 2024-01-31 NOTE — Progress Notes (Signed)
 After this RN helped the patient return from the bathroom, noticeable cigarette scent in the room.  RN asked patient if he smoked and patient stated he took one puff.  RN educated patient on smoking in the hospital. Patient apologetic. Patient allowed RN to place cigarettes and lighter in the closet across the room. MD notified.

## 2024-01-31 NOTE — Progress Notes (Signed)
 HD#2 SUBJECTIVE:  Patient Summary: Francisco [name] is a 63 y.o. with a pertinent PMH of 63 year old with COPD (no baseline O2), HFpEF, CAD, tobacco and IV amphetamine use disorder, and pAfib presented for SOB with hypoxic respiratory failure and admitted for COPD exacerbation, treated with steroids and antibiotics due to increased sputum production, improving.   Overnight Events: Patient became nauseated/dizzy, diaphoretic, and short of breath around 1730 on 10/13. Afebrile on assessment. EKG showed NSR. Saturated 100% on 3L . BG 129. Similar event happed overnight. Patient was given flutter valve, duonebs. Troponins flat. Was dyspneic but not hypoxic. At 0600, nurse found patient smoking cigarettes in bathroom.  Interim History: Patient evaluated at bedside. Endorses continued difficulty breathing, shaking, and tremors. He has been having new abdominal pain and bloating, but reports normal appetite and bowel movements. Has not been sleeping well. Noted to have an albuterol  inhaler in hand that he states he has not used.  OBJECTIVE:  Vital Signs: Vitals:   01/31/24 0736 01/31/24 0745 01/31/24 1018 01/31/24 1100  BP: (!) 139/92  (!) 176/94 (!) 167/90  Pulse: 78 77 82 81  Resp: 16 16  20   Temp: 97.7 F (36.5 C)   (!) 97.5 F (36.4 C)  TempSrc: Oral   Oral  SpO2: 94%   94%  Weight:      Height:       Supplemental O2: Nasal Cannula SpO2: 94 % O2 Flow Rate (L/min): 3 L/min FiO2 (%): 36 %  Filed Weights   01/28/24 1947 01/30/24 0310 01/30/24 2241  Weight: 88.9 kg 92.2 kg 92.7 kg     Intake/Output Summary (Last 24 hours) at 01/31/2024 1404 Last data filed at 01/31/2024 1226 Gross per 24 hour  Intake 361.05 ml  Output 1125 ml  Net -763.95 ml   Net IO Since Admission: -546.95 mL [01/31/24 1404]  Physical Exam: Physical Exam Cardiovascular:     Rate and Rhythm: Normal rate and regular rhythm.  Pulmonary:     Effort: Pulmonary effort is normal.     Breath sounds: Decreased  breath sounds and rhonchi present.  Chest:     Chest wall: No tenderness.  Abdominal:     General: Bowel sounds are normal.     Tenderness: There is abdominal tenderness.     Comments: Distended, tender to palpation in all 4 quadrants  Musculoskeletal:        General: Normal range of motion.     Cervical back: Normal range of motion.     Right lower leg: No tenderness. No edema.     Left lower leg: No tenderness. No edema.  Skin:    General: Skin is warm and dry.  Neurological:     General: No focal deficit present.     Mental Status: He is alert.  Psychiatric:        Mood and Affect: Mood is anxious.        Behavior: Behavior normal.     Patient Lines/Drains/Airways Status     Active Line/Drains/Airways     Name Placement date Placement time Site Days   Peripheral IV 01/28/24 20 G Left Antecubital 01/28/24  1948  Antecubital  3            Pertinent labs and imaging:     Latest Ref Rng & Units 01/31/2024    3:53 AM 01/29/2024   12:11 PM 01/28/2024    8:09 PM  CBC  WBC 4.0 - 10.5 K/uL 16.2  16.0  Hemoglobin 13.0 - 17.0 g/dL 85.2  85.4  83.9   Hematocrit 39.0 - 52.0 % 47.2  45.8  47.0   Platelets 150 - 400 K/uL 271  314         Latest Ref Rng & Units 01/31/2024    3:53 AM 01/29/2024    1:17 AM 01/28/2024    8:09 PM  CMP  Glucose 70 - 99 mg/dL 881  809  841   BUN 8 - 23 mg/dL 33  23  36   Creatinine 0.61 - 1.24 mg/dL 8.71  8.91  8.79   Sodium 135 - 145 mmol/L 140  140  144   Potassium 3.5 - 5.1 mmol/L 5.1  5.0  5.0   Chloride 98 - 111 mmol/L 100  103  101   CO2 22 - 32 mmol/L 31  28    Calcium  8.9 - 10.3 mg/dL 8.4  8.0    Total Protein 6.5 - 8.1 g/dL  5.8    Total Bilirubin 0.0 - 1.2 mg/dL  0.7    Alkaline Phos 38 - 126 U/L  58    AST 15 - 41 U/L  17    ALT 0 - 44 U/L  37      DG CHEST PORT 1 VIEW Result Date: 01/30/2024 EXAM: 1 VIEW(S) XRAY OF THE CHEST 01/30/2024 05:51:00 PM COMPARISON: 01/28/2024 CLINICAL HISTORY: Shortness of breath 10026.  sob FINDINGS: LUNGS AND PLEURA: No focal pulmonary opacity. No pulmonary edema. No pleural effusion. No pneumothorax. HEART AND MEDIASTINUM: No acute abnormality of the cardiac and mediastinal silhouettes. BONES AND SOFT TISSUES: No acute osseous abnormality. IMPRESSION: 1. No acute process. Electronically signed by: Donnice Mania MD 01/30/2024 08:57 PM EDT RP Workstation: [id number]    ASSESSMENT/PLAN:  Assessment: Principal Problem:   COPD with acute exacerbation (HCC) Active Problems:   CAD S/P percutaneous coronary angioplasty   Methamphetamine use   Essential hypertension   Back pain   Combined systolic and diastolic congestive heart failure (HCC)   Hyperlipidemia   PAF (paroxysmal atrial fibrillation) (HCC)   Acute hypoxic respiratory failure (HCC)   Continuous dependence on cigarette smoking  Francisco [name] is a 63 y.o. with a pertinent PMH of 63 year old with COPD (no baseline O2), HFpEF, CAD, tobacco and IV amphetamine use disorder, and pAfib presented for SOB with hypoxic respiratory failure and admitted for COPD exacerbation, treated with steroids and antibiotics due to increased sputum production, improving.   Plan: COPD with acute exacerbation Reports continued SOB. Afebrile. Steroids and antibiotics for increased dark green sputum production. Leukocytosis on admission, improving today to 16.2. Started prednisone  on 10/6 for this during previous encounter, will need taper. CXR showed no active disease. COVID, RPP negative. Give flu vaccine, already received RSV and pneumonia vaccine. Patient saturating 90-92% on 3L [place]. Continued tremulousness, unsure of albuterol  use frequency given inhaler found in patient's hand while sleeping. - Prednisone  40 mg daily, start taper on discharge - Duoneb q6 h - Resume outpatient LAMA/LABA/ICS - Augmentin  + Azithromycin  PO through 10/16 (day 2/4) - Trial CPAP at night - Ambulatory pulse ox today   Abdominal Distension Patient reports bloating  and diffuse discomfort across the abdomen since admission. Feels as though he cannot sit up because his abdomen is too tight. Last BM today, able to tolerate diet without nausea or vomiting. Will order KUB to rule out any abdominal pathology potentially contributing to SOB. -Pending KUB  Acute hypoxic respiratory failure  Due to COPD exacerbation with pneumonia.  Not from volume overload or PE. Not on home oxygen . Wean O2 to room air as able. Ambulatory SpO2 to determine outpatient oxygen  need.   CAD S/P percutaneous coronary angioplasty Stable, with some mild myocardial demand ischemia on admission that has resolved. Outpatient statin resumed.   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 4 days since last use. Ongoing use despite prior quit attempts, adverse health and psychosocial effects. Motivated to quit. UDS positive for amphetamines and opiates. Patient needs updated Hep A/B vaccine.  - Needs f/u for Hepatitis C treatment with Dr. Eben at [facility]   Essential hypertension Chronic, poor control. Resumed outpatient diltiazem  and losartan .   History of Back pain Acute thoracic back pain. Worsens with cough. With history of chronic lumbar back pain and 4 prior surgeries. With IVDU, but no fevers or worrisome physical exam findings. Deferring imaging. Multimodal pain management strategy per below. Avoid NSAIDs given CAD with stent. 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 Recent echo shows EF 60-65%, recovered from prior ~30% in 2021. Euvolemic. Outpatient therapy resumed. - Losartan  50 mg daily - Metoprolol  tartrate 25 mg bid   Hyperlipidemia Chronic. Outpatient statin resumed.   Paroxysmal atrial fibrillation  Sinus rhythm at present. Outpatient therapies resumed including DOAC. - Metoprolol  per  above - Diltiazem  120 mg daily - Eliquis  5mg  BID   Tobacco Use Disorder 1/2 ppd, down from 2 ppd previously. Motivated to quit. Patient found smoking cigarettes in bathroom overnight. Holding nicotine  patch for now.     Best Practice: Diet: Heart Healthy IVF: Fluids: none VTE: SCDs Start: 01/28/24 2143, Eliquis  for Afib Code: Full  Disposition planning: Therapy Recs: None, DME: none Family Contact: DISPO: Anticipated discharge in 1-2 days to Home pending clinical improvement and outpatient supplement oxygen  needs.  Signature:  Armilda Vanderlinden, DO [facility]  2:04 PM, 01/31/2024  On Call pager [phone]

## 2024-01-31 NOTE — Telephone Encounter (Signed)
 Appeal has been submitted for [employer]. Will advise when response is received, please be advised that most companies may take 30 days to make a decision. Appeal letter and supporting documentation have been faxed to [phone] on 01/31/2024 @2 :23 pm.  Thank you, Devere Pandy, PharmD Clinical Pharmacist  [place]  Direct Dial: [phone]

## 2024-01-31 NOTE — Plan of Care (Signed)
  Problem: Clinical Measurements: Goal: Diagnostic test results will improve Outcome: Progressing Goal: Respiratory complications will improve Outcome: Progressing Goal: Cardiovascular complication will be avoided Outcome: Progressing   Problem: Activity: Goal: Risk for activity intolerance will decrease Outcome: Progressing   Problem: Nutrition: Goal: Adequate nutrition will be maintained Outcome: Progressing   Problem: Coping: Goal: Level of anxiety will decrease Outcome: Progressing   Problem: Pain Managment: Goal: General experience of comfort will improve and/or be controlled Outcome: Progressing   Problem: Safety: Goal: Ability to remain free from injury will improve Outcome: Progressing   Problem: Respiratory: Goal: Levels of oxygenation will improve Outcome: Progressing

## 2024-01-31 NOTE — Plan of Care (Signed)
  Problem: Clinical Measurements: Goal: Respiratory complications will improve Outcome: Progressing Goal: Cardiovascular complication will be avoided Outcome: Progressing   Problem: Activity: Goal: Risk for activity intolerance will decrease Outcome: Progressing   Problem: Nutrition: Goal: Adequate nutrition will be maintained Outcome: Progressing   Problem: Coping: Goal: Level of anxiety will decrease Outcome: Progressing   Problem: Pain Managment: Goal: General experience of comfort will improve and/or be controlled Outcome: Progressing   Problem: Safety: Goal: Ability to remain free from injury will improve Outcome: Progressing   Problem: Respiratory: Goal: Levels of oxygenation will improve Outcome: Progressing Goal: Ability to maintain adequate ventilation will improve Outcome: Progressing

## 2024-01-31 NOTE — Plan of Care (Signed)
  Problem: Education: Goal: Knowledge of General Education information will improve Description: Including pain rating scale, medication(s)/side effects and non-pharmacologic comfort measures Outcome: Progressing   Problem: Health Behavior/Discharge Planning: Goal: Ability to manage health-related needs will improve Outcome: Progressing   Problem: Clinical Measurements: Goal: Ability to maintain clinical measurements within normal limits will improve Outcome: Progressing Goal: Cardiovascular complication will be avoided Outcome: Progressing   Problem: Nutrition: Goal: Adequate nutrition will be maintained Outcome: Progressing   Problem: Safety: Goal: Ability to remain free from injury will improve Outcome: Progressing   Problem: Education: Goal: Knowledge of disease or condition will improve Outcome: Not Progressing   Problem: Respiratory: Goal: Ability to maintain a clear airway will improve Outcome: Progressing Goal: Levels of oxygenation will improve Outcome: Progressing Goal: Ability to maintain adequate ventilation will improve Outcome: Progressing

## 2024-02-01 ENCOUNTER — Other Ambulatory Visit (HOSPITAL_COMMUNITY): Payer: Self-pay

## 2024-02-01 DIAGNOSIS — I480 Paroxysmal atrial fibrillation: Secondary | ICD-10-CM | POA: Diagnosis not present

## 2024-02-01 DIAGNOSIS — Z8673 Personal history of transient ischemic attack (TIA), and cerebral infarction without residual deficits: Secondary | ICD-10-CM

## 2024-02-01 DIAGNOSIS — J441 Chronic obstructive pulmonary disease with (acute) exacerbation: Secondary | ICD-10-CM | POA: Diagnosis not present

## 2024-02-01 DIAGNOSIS — I110 Hypertensive heart disease with heart failure: Secondary | ICD-10-CM | POA: Diagnosis not present

## 2024-02-01 DIAGNOSIS — I5042 Chronic combined systolic (congestive) and diastolic (congestive) heart failure: Secondary | ICD-10-CM | POA: Diagnosis not present

## 2024-02-01 LAB — CBC WITH DIFFERENTIAL/PLATELET
Abs Immature Granulocytes: 0.11 K/uL — ABNORMAL HIGH (ref 0.00–0.07)
Basophils Absolute: 0 K/uL (ref 0.0–0.1)
Basophils Relative: 0 %
Eosinophils Absolute: 0 K/uL (ref 0.0–0.5)
Eosinophils Relative: 0 %
HCT: 46.7 % (ref 39.0–52.0)
Hemoglobin: 15 g/dL (ref 13.0–17.0)
Immature Granulocytes: 1 %
Lymphocytes Relative: 11 %
Lymphs Abs: 1.7 K/uL (ref 0.7–4.0)
MCH: 31.4 pg (ref 26.0–34.0)
MCHC: 32.1 g/dL (ref 30.0–36.0)
MCV: 97.9 fL (ref 80.0–100.0)
Monocytes Absolute: 1.4 K/uL — ABNORMAL HIGH (ref 0.1–1.0)
Monocytes Relative: 9 %
Neutro Abs: 12.2 K/uL — ABNORMAL HIGH (ref 1.7–7.7)
Neutrophils Relative %: 79 %
Platelets: 247 K/uL (ref 150–400)
RBC: 4.77 MIL/uL (ref 4.22–5.81)
RDW: 13.4 % (ref 11.5–15.5)
WBC: 15.4 K/uL — ABNORMAL HIGH (ref 4.0–10.5)
nRBC: 0 % (ref 0.0–0.2)

## 2024-02-01 LAB — BASIC METABOLIC PANEL WITH GFR
Anion gap: 11 (ref 5–15)
BUN: 19 mg/dL (ref 8–23)
CO2: 35 mmol/L — ABNORMAL HIGH (ref 22–32)
Calcium: 8.4 mg/dL — ABNORMAL LOW (ref 8.9–10.3)
Chloride: 94 mmol/L — ABNORMAL LOW (ref 98–111)
Creatinine, Ser: 1.08 mg/dL (ref 0.61–1.24)
GFR, Estimated: >60 mL/min (ref >60)
Glucose, Bld: 133 mg/dL — ABNORMAL HIGH (ref 70–99)
Potassium: 5.2 mmol/L — ABNORMAL HIGH (ref 3.5–5.1)
Sodium: 140 mmol/L (ref 135–145)

## 2024-02-01 MED ORDER — LOSARTAN POTASSIUM 50 MG PO TABS
50.0000 mg | ORAL_TABLET | Freq: Every day | ORAL | 0 refills | Status: DC
  Filled 2024-02-01: qty 30, 30d supply, fill #0

## 2024-02-01 MED ORDER — ATORVASTATIN CALCIUM 40 MG PO TABS
40.0000 mg | ORAL_TABLET | Freq: Every day | ORAL | 0 refills | Status: DC
  Filled 2024-02-01: qty 30, 30d supply, fill #0

## 2024-02-01 MED ORDER — APIXABAN 5 MG PO TABS
5.0000 mg | ORAL_TABLET | Freq: Two times a day (BID) | ORAL | 0 refills | Status: AC
  Filled 2024-02-01 – 2024-03-06 (×4): qty 60, 30d supply, fill #0

## 2024-02-01 MED ORDER — DILTIAZEM HCL ER COATED BEADS 120 MG PO CP24
120.0000 mg | ORAL_CAPSULE | Freq: Every day | ORAL | 0 refills | Status: DC
  Filled 2024-02-01 – 2024-03-06 (×3): qty 30, 30d supply, fill #0

## 2024-02-01 MED ORDER — BENZONATATE 200 MG PO CAPS
200.0000 mg | ORAL_CAPSULE | Freq: Three times a day (TID) | ORAL | 0 refills | Status: AC | PRN
  Filled 2024-02-01: qty 20, 7d supply, fill #0

## 2024-02-01 MED ORDER — AMOXICILLIN-POT CLAVULANATE 875-125 MG PO TABS
1.0000 | ORAL_TABLET | Freq: Two times a day (BID) | ORAL | 0 refills | Status: AC
  Filled 2024-02-01: qty 2, 1d supply, fill #0

## 2024-02-01 MED ORDER — BUPROPION HCL ER (SR) 150 MG PO TB12
150.0000 mg | ORAL_TABLET | Freq: Two times a day (BID) | ORAL | 20 refills | Status: AC
  Filled 2024-02-01: qty 60, 30d supply, fill #0

## 2024-02-01 MED ORDER — OXYCODONE HCL 5 MG PO TABS
5.0000 mg | ORAL_TABLET | Freq: Once | ORAL | Status: AC
  Administered 2024-02-01: 5 mg via ORAL
  Filled 2024-02-01: qty 1

## 2024-02-01 MED ORDER — PREDNISONE 10 MG PO TABS
ORAL_TABLET | ORAL | 0 refills | Status: DC
  Filled 2024-02-01: qty 54, 23d supply, fill #0

## 2024-02-01 MED ORDER — METOPROLOL TARTRATE 25 MG PO TABS
25.0000 mg | ORAL_TABLET | Freq: Two times a day (BID) | ORAL | 0 refills | Status: DC
  Filled 2024-02-01: qty 60, 30d supply, fill #0

## 2024-02-01 MED ORDER — GUAIFENESIN-DM 100-10 MG/5ML PO SYRP
15.0000 mL | ORAL_SOLUTION | Freq: Four times a day (QID) | ORAL | 0 refills | Status: DC
  Filled 2024-02-01: qty 118, 2d supply, fill #0

## 2024-02-01 MED ORDER — HYDROXYZINE HCL 25 MG PO TABS
25.0000 mg | ORAL_TABLET | Freq: Three times a day (TID) | ORAL | Status: DC | PRN
Start: 2024-02-01 — End: 2024-02-01
  Administered 2024-02-01: 25 mg via ORAL
  Filled 2024-02-01: qty 1

## 2024-02-01 MED ORDER — ALBUTEROL SULFATE HFA 108 (90 BASE) MCG/ACT IN AERS
1.0000 | INHALATION_SPRAY | RESPIRATORY_TRACT | 0 refills | Status: DC | PRN
  Filled 2024-02-01: qty 18, 30d supply, fill #0
  Filled 2024-02-03 (×3): qty 18, 17d supply, fill #0
  Filled 2024-02-03: qty 18, 16d supply, fill #0
  Filled 2024-02-04: qty 18, 17d supply, fill #0

## 2024-02-01 MED ORDER — HYDROXYZINE HCL 25 MG PO TABS
25.0000 mg | ORAL_TABLET | Freq: Three times a day (TID) | ORAL | 0 refills | Status: AC | PRN
  Filled 2024-02-01: qty 42, 14d supply, fill #0

## 2024-02-01 NOTE — Discharge Summary (Signed)
 Name: Francisco [name] MRN: [id number] DOB: [birth year] 63 y.o. PCP: Heddy Barren, DO  Date of Admission: 01/28/2024  7:34 PM Date of Discharge: 02/01/2024  Attending Physician: Dr. Reyes Fenton  Discharge Diagnosis: Principal Problem:   COPD with acute exacerbation [facility]) Active Problems:   CAD S/P percutaneous coronary angioplasty   Methamphetamine use   Essential hypertension   Back pain   Combined systolic and diastolic congestive heart failure (HCC)   Hyperlipidemia   PAF (paroxysmal atrial fibrillation) (HCC)   Acute hypoxic respiratory failure (HCC)   Continuous dependence on cigarette smoking  Leukocytosis   Discharge Medications: Allergies as of 02/01/2024       Reactions   Ivp Dye [iodinated Contrast Media] Other ([name] Comments)   Seizures   Tramadol Other ([name] Comments)   Seizures   Doxycycline  Other ([name] Comments)   Burning sensation to his hands.        Medication List     STOP taking these medications    nicotine  14 mg/24hr patch Commonly known as: NICODERM CQ  - dosed in mg/24 hours       TAKE these medications    albuterol  108 (90 Base) MCG/ACT inhaler Commonly known as: Ventolin  HFA Inhale 1-2 puffs into the lungs every 4 (four) hours as needed for wheezing or shortness of breath. What changed: how much to take   Albuterol -Budesonide  90-80 MCG/ACT Aero Inhale 2 puffs into the lungs every 4 (four) hours as needed (wheeze, shortness of breath).   amoxicillin -clavulanate 875-125 MG tablet Commonly known as: AUGMENTIN  Take 1 tablet by mouth every 12 (twelve) hours for 1 day.   apixaban  5 MG Tabs tablet Commonly known as: Eliquis  Take 1 tablet (5 mg total) by mouth 2 (two) times daily.   atorvastatin  40 MG tablet Commonly known as: LIPITOR  Take 1 tablet (40 mg total) by mouth daily.   benzonatate  200 MG capsule Commonly known as: TESSALON  Take 1 capsule (200 mg total) by mouth 3 (three) times daily as needed for cough.    buPROPion 150 MG 12 hr tablet Commonly known as: Wellbutrin SR Take 1 tablet (150 mg total) by mouth 2 (two) times daily. Take 1 tablet once daily for the first 3 days (on 10/15-10/17), then take one tablet twice daily (10/18 and onward)   diltiazem  120 MG 24 hr capsule Commonly known as: CARDIZEM  CD Take 1 capsule (120 mg total) by mouth daily.   Dupixent  300 MG/2ML Soaj Generic drug: Dupilumab  Inject 300 mg into the skin every 14 (fourteen) days.   fluticasone  50 MCG/ACT nasal spray Commonly known as: FLONASE  Place 1 spray into both nostrils daily.   guaiFENesin -dextromethorphan  100-10 MG/5ML syrup Commonly known as: ROBITUSSIN DM Take 15 mLs by mouth 4 (four) times daily.   hydrOXYzine  25 MG tablet Commonly known as: ATARAX  Take 1 tablet (25 mg total) by mouth 3 (three) times daily as needed for up to 14 days for anxiety.   ipratropium-albuterol  0.5-2.5 (3) MG/3ML Soln Commonly known as: DUONEB Take 3 mLs by nebulization every 6 (six) hours as needed.   loratadine  10 MG tablet Commonly known as: Claritin  Take 1 tablet (10 mg total) by mouth daily.   losartan  50 MG tablet Commonly known as: COZAAR  Take 1 tablet (50 mg total) by mouth daily.   metoprolol  tartrate 25 MG tablet Commonly known as: LOPRESSOR  Take 1 tablet (25 mg total) by mouth 2 (two) times daily.   nitroGLYCERIN  0.4 MG SL tablet Commonly known as: NITROSTAT  Place 1 tablet (0.4 mg  total) under the tongue every 5 (five) minutes x 3 doses as needed for chest pain.   predniSONE  10 MG tablet Commonly known as: DELTASONE  Take 4 tablets (40 mg total) by mouth daily with breakfast for 2 days, THEN 3 tablets (30 mg total) daily with breakfast for 7 days, THEN 2 tablets (20 mg total) daily with breakfast for 7 days, THEN 1 tablet (10 mg total) daily with breakfast for 7 days, THEN 0.5 tablets (5 mg total) daily with breakfast for 7 days. Start taking on: February 01, 2024 What changed:  medication strength [name]  the new instructions.   thiamine  100 MG tablet Commonly known as: VITAMIN B1 Take 1 tablet (100 mg total) by mouth daily.               Durable Medical Equipment  (From admission, onward)           Start     Ordered   02/01/24 1219  For home use only DME oxygen   Once       Question Answer Comment  Length of Need Lifetime   Liters per Minute 2   Frequency Continuous (stationary and portable oxygen  unit needed)   Oxygen  delivery system Gas      02/01/24 1218            Disposition and follow-up:   Francisco [name] was discharged from [facility] in Stable condition.  At the hospital follow up visit please address:  1.  Follow-up:  a. Started on Wellbutrin 150mg  BID for smoking cessation and anxiety. [name] how patient is doing on medication. If no improvement, consider adding Buspar. Check CBC and CMP.    b. Hepatitis A/B vaccination update, patient needs flu vaccination as well if not given prior to discharge from hospital. Following up on 1029 with Dr. Eben for Hep C treatment.   c. Follow up tobacco and methamphetamine use disorders and cessation with Wellbutrin.   d. Ensure patient follows up with pulmonology. Anselm appeal made on 10/14. Pending approval for triple therapy inhaler.   2.  Labs / imaging needed at time of follow-up: CBC, CMP   Follow-up Appointments: 10/29 with Dr. Eben, 11/13 with Dr. Annella  Follow-up Information     [facility]  Follow up.   Why: oxygen  Contact information: [address] [place] [address] [phone]                 Hospital Course by problem list: Francisco [name] is a 63 y.o. with a pertinent PMH of 63 year old with COPD (no baseline O2), HFpEF, CAD, tobacco and IV amphetamine use disorder, and pAfib presented for SOB with hypoxic respiratory failure and admitted for COPD exacerbation, treated with steroids and antibiotics due to increased sputum production, improving.     Plan: Acute Hypoxic Respiratory Failure 2/2 COPD with acute exacerbation Presented with continued, worsening SOB. Afebrile, but hypertensive and tachypneic on arrival to ED with rhonchi and labored breathing. Patient took multiple DuoNeb treatments at home prior to admission that did not help. In ED, steroids and IV antibiotics given for increased dark green sputum production. O2 saturations were 83% on room air and uptitrated to 96% on 4L Francisco [name]. Leukocytosis on admission to 18.9, improved on discharge to 15.4. Elevated lactic acid that peaked to 3.6, responsive to fluids. CXR showed no active disease. COVID, RPP negative. Needs updated flu vaccine, already received RSV and pneumonia vaccine. Patient remained on 2-3L of [place] while admitted, remaining  at goal above 90% for COPD. Continued on Duonebs q4h and was resumed triple therapy inhaler. Previously started on Prednisone  40mg  on 10/6 for sputum production during a previous encounter, started taper at discharge. Continued PO Azithromycin  500mg  x 3 days, completed on 10/14, and PO Augmentin  875-125mg , to be completed outpatient on 10/16. Patient had continued tremulousness during admission, likely in the setting of duonebs, albuterol  use frequency (given inhaler found in patient's hand while sleeping), nicotine  patch, and potential withdrawal symptoms from IV methamphetamine use. Additionally, patient maintained subjective shortness of breath despite inhalers and nebulizer, possibly due to underlying anxiety. Will discharge with antioxlytics.  Ambulated patient with pulse ox, where he maintained 88% O2 saturation while ambulating on room air, qualifying for home oxygen .    Abdominal Distension Patient reported bloating and diffuse discomfort across the abdomen since admission. Felt as though he could not sit up because his abdomen was too tight. Last BM day of discharge, able to tolerate diet without nausea or vomiting.  KUB showed no abdominal pathology  potentially contributing to SOB. Gave patient bowel regimen of Miralax  and Sennokot.     Acute hypoxic respiratory failure  Due to COPD exacerbation with pneumonia. Not from volume overload or PE. Not on home oxygen . Weaned O2 to room air as able. Ambulatory SpO2 to determine outpatient oxygen  need, of which he needs at discharge. Orders were placed for patient to have home oxygen .   CAD S/P percutaneous coronary angioplasty Stable, with some mild myocardial demand ischemia on admission that has resolved. Outpatient statin resumed.   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 5 days since last use. Ongoing use despite prior quit attempts, adverse health and psychosocial effects. Motivated to quit. UDS positive for amphetamines and opiates. Patient needs updated Hep A/B vaccine. Scheduled follow up for Hepatitis C treatment with Dr. Eben at [facility] clinic   Essential hypertension Chronic, poor control. Resumed outpatient diltiazem  and losartan .   History of Back pain Acute thoracic back pain. Worsened with cough. With history of chronic lumbar back pain and 4 prior surgeries. With IVDU, but no fevers or worrisome physical exam findings. Deferring imaging. Multimodal pain management strategy per below. Avoid NSAIDs given CAD with stent. Pain management added, including Acetaminophen  1000 mg q6 h, Lidocaine  patches + voltaren  gel, Heating pads, and Oxycodone  5 mg q6 h for severe pain.   Combined systolic and diastolic congestive heart failure (HCC) Chronic, stable. Recent echo shows EF 60-65%, recovered from prior ~30% in 2021. Euvolemic. Outpatient therapy resumed, including Losartan  50 mg daily and Metoprolol  tartrate 25 mg bid.   Hyperlipidemia Chronic. Outpatient statin resumed.   Paroxysmal atrial fibrillation  Sinus rhythm at present. Outpatient therapies resumed as above, including Eliquis  5mg  BID.    Tobacco Use Disorder 1/2 ppd, down from 2 ppd previously.  Motivated to quit. Patient found smoking cigarettes in bathroom overnight. Held nicotine  patch while inpatient.     Discharge Subjective: The patient reports that he has not been shaking as much as yesterday. States that he used an albuterol  inhaler about an hour ago, however the inhaler is open at bedside. He endorses continued back and abdominal pain and recently had a BM 30 minutes prior to evaluation. He is anxious about his subjective shortness of breath and ambulation today, as he believes he will need oxygen  at discharge. Discussed subjective vs objective dyspnea and how anxiety can play a large part into subjective dyspnea despite reassuring objective pulse oximetry.  Discharge Exam:  BP (!) 180/77 (BP Location: Left Arm)   Pulse 100   Temp 97.8 F (36.6 C) (Oral)   Resp 20   Ht 6' (1.829 m)   Wt 92.7 kg   SpO2 96%   BMI 27.72 kg/m  Constitutional: anxious-appearing male sitting in bed, in no acute distress Cardiovascular:     Rate and Rhythm: Normal rate and regular rhythm.  Pulmonary:     Effort: Pulmonary effort is normal.     Breath sounds: Decreased breath sounds and rhonchi present.  Chest:     Chest wall: No tenderness.  Abdominal:     General: Bowel sounds are normal.     Tenderness: There is abdominal tenderness.     Comments: Distended, tender to palpation in all 4 quadrants  Musculoskeletal:        General: Normal range of motion.     Cervical back: Normal range of motion.     Right lower leg: No tenderness. No edema.     Left lower leg: No tenderness. No edema.  Skin:    General: Skin is warm and dry.  Neurological:     General: No focal deficit present.     Mental Status: He is alert.  Psychiatric:        Mood and Affect: Mood is anxious.        Behavior: Behavior normal.   Pertinent Labs, Studies, and Procedures:     Latest Ref Rng & Units 02/01/2024    7:46 AM 01/31/2024    3:53 AM 01/29/2024   12:11 PM  CBC  WBC 4.0 - 10.5 K/uL 15.4  16.2   16.0   Hemoglobin 13.0 - 17.0 g/dL 84.9  85.2  85.4   Hematocrit 39.0 - 52.0 % 46.7  47.2  45.8   Platelets 150 - 400 K/uL 247  271  314        Latest Ref Rng & Units 02/01/2024    7:46 AM 01/31/2024    3:53 AM 01/29/2024    1:17 AM  CMP  Glucose 70 - 99 mg/dL 866  881  809   BUN 8 - 23 mg/dL 19  33  23   Creatinine 0.61 - 1.24 mg/dL 8.91  8.71  8.91   Sodium 135 - 145 mmol/L 140  140  140   Potassium 3.5 - 5.1 mmol/L 5.2  5.1  5.0   Chloride 98 - 111 mmol/L 94  100  103   CO2 22 - 32 mmol/L 35  31  28   Calcium  8.9 - 10.3 mg/dL 8.4  8.4  8.0   Total Protein 6.5 - 8.1 g/dL   5.8   Total Bilirubin 0.0 - 1.2 mg/dL   0.7   Alkaline Phos 38 - 126 U/L   58   AST 15 - 41 U/L   17   ALT 0 - 44 U/L   37     DG Chest Port 1 View Result Date: 01/28/2024 CLINICAL DATA:  Shortness of breath EXAM: PORTABLE CHEST 1 VIEW COMPARISON:  01/27/2024 FINDINGS: The heart size and mediastinal contours are within normal limits. Both lungs are clear. The visualized skeletal structures are unremarkable. No pneumothorax. IMPRESSION: No active disease. Electronically Signed   By: Franky Crease M.D.   On: 01/28/2024 20:35     Discharge Instructions:   Discharge Instructions      Francisco [name],  You were recently admitted to [facility] for shortness of breath that continuously worsened. You were  treated with oxygen , nebulizers, and inhalers to help you breathe better. This is an exacerbation of your COPD, which is likely worsened by your cigarette smoking. A chest Xray did not show any signs of pneumonia, and you do not have any infection that could be worsening your breathing.   Continue taking your home medications with the following changes:  Start taking: Amoxicillin -Clavulanate (Augmentin ) 875-125mg  tablet. Take 1 tablet twice a day on 10/16. Benzonatate  (Tessalon ) 200mg  capsule. Take 1 capsule 3 times daily as needed for cough Guaifenesin -Dextromethorphan  (Robitussin DM) 100-10mg /57mL  syrup. Take 15mL by mouth 4 times daily for cough. Hydroxyzine  (Atarax ) 25mg  tablet. Take 1 tablet 3 times daily as needed for anxiety. This medicine can make you sleepy, so be cautious when taking this medicine. Buproprion (Wellbutrin) 150mg  tablet. Take 1 tablet once a day for the first three days starting on 10/15 until 10/17. Then starting on 10/18, please take 1 tablet twice daily. This is a longer-acting anxiety medication. Change how you take: Prednisone  tablets. We will be tapering your steroids in the following way: Take 4 tablets (40mg  total) daily with breakfast on 10/16 and 10/17. Take 3 tablets (30mg  total) daily with breakfast from 10/18 to 10/24. Take 2 tablets (20mg  total) daily with breakfast from 10/25 to 10/31. Take 1 tablet (10mg  total) daily with breakfast from 11/1 to 11/7. Lastly, take 1/2 tablet (5mg  total) daily with breakfast from 11/8 to 11/14. Continue taking: Albuterol  inhaler, 2 puffs every four hours as needed for wheezing and shortness of breath Albuterol  nebulizers Eliquis  5mg  tablets, take 1 tablet twice daily Atorvastatin  40mg  tablet, once daily Diltiazem  120mg , once daily Dupilumab  (Dupixent ) 300mg  injections once every 2 weeks Flonase  nasal spray, 1 spray per nostril daily Duoneb nebulizers every 6 hours as needed Claritin  10mg  tablet, once daily Losartan  50mg  tablet, once daily Metoprolol  Tartrate (Lopressor ) 25mg  tablet, one tablet twice daily Nitroglycerin  0.4mg  sublingual tablet, as needed for chest pain Thiamine  (vitamin B1) 100mg  tablet   You should seek further medical care if you develop worsening shortness of breath, you develop a fever  > 101F, or develop any new weakness or pain.  We have scheduled you a hospital follow up appointment on Wednesday, 10/22 at 2:15pm with Dr. Isobel. We have also scheduled you an appointment with Dr. Eben on Wednesday, 10/29 at 1:45pm. Both of these appointments are at the Internal Medicine Clinic.  We  have also called [facility] and scheduled you a sooner follow up appointment with Dr. Annella for Thursday, November 13th at 11:30AM.   We are so glad that you are feeling better.  Sincerely, Francisco Foutz, DO         Signed: Rowan Blaker, DO Internal Medicine Resident, PGY-1 02/01/2024, 1:10 PM   Please contact the on call pager after 5 pm and on weekends at [phone].

## 2024-02-01 NOTE — Progress Notes (Signed)
  Francisco [name]  Mobility Specialist   Progress Notes    Signed   Date of Service: 02/01/2024 11:45 AM  Signed    Nurse requested Mobility Specialist to perform oxygen  saturation test with pt which includes removing pt from oxygen  both at rest and while ambulating.  Below are the results from that testing.      Patient Saturations on Room Air at Rest = spO2 93%   Patient Saturations on Room Air while Ambulating = sp02 88% .     Patient Saturations on 2 Liters of oxygen  while Ambulating = sp02 93%   At end of testing pt left in room on 2  Liters of oxygen .   Reported results to nurse.      [name] Mobility Specialist Please contact via SecureChat or Rehab Office [phone]          Durable Medical Equipment (From admission, onward)        Start     Ordered  02/01/24 1121  For home use only DME oxygen   Once      Question Answer Comment Length of Need Lifetime  Oxygen  delivery system Gas    02/01/24 1120

## 2024-02-01 NOTE — Progress Notes (Signed)
 Nurse requested Mobility Specialist to perform oxygen  saturation test with pt which includes removing pt from oxygen  both at rest and while ambulating.  Below are the results from that testing.     Patient Saturations on Room Air at Rest = spO2 93%  Patient Saturations on Room Air while Ambulating = sp02 88% .    Patient Saturations on 2 Liters of oxygen  while Ambulating = sp02 93%  At end of testing pt left in room on 2  Liters of oxygen .  Reported results to nurse.    Lauraine Erm Mobility Specialist Please contact via SecureChat or [employer] [phone]

## 2024-02-01 NOTE — Progress Notes (Signed)
 Mobility Specialist Progress Note;   02/01/24 1105  Mobility  Activity Ambulated with assistance  Level of Assistance Standby assist, set-up cues, supervision of patient - no hands on  Assistive Device None  Distance Ambulated (ft) 100 ft  Activity Response Tolerated fair  Mobility Referral Yes  Mobility visit 1 Mobility  Mobility Specialist Start Time (ACUTE ONLY) 1105  Mobility Specialist Stop Time (ACUTE ONLY) 1116  Mobility Specialist Time Calculation (min) (ACUTE ONLY) 11 min   Pt agreeable to mobility and walking O2 test. Required 2LO2 to maintain SPO2 93%>. After ~145ft of ambulation, pt stated he was going to pass out and required a seated break and to be wheeled back to room. BP checked 165/79 (100). Pt recovered well, SPO2 97% on 2LO2. Left with all needs met. RN notified.   Lauraine Erm Mobility Specialist Please contact via SecureChat or [employer] [phone]

## 2024-02-01 NOTE — TOC Transition Note (Signed)
 Transition of Care [facility]) - Discharge Note   Patient Details  Name: [name] MRN: [id number] Date of Birth: 1961/02/28  [facility] of [facility] CM/SW Contact:  Roxie KANDICE Stain, RN Phone Number: 02/01/2024, 12:21 PM   Clinical Narrative:    Francisco [name] is stable to discharge home. Follow up apt on AVS. Patient is agreeable to home 02 from [facility]. Notified Mitch with [employer].  Address, Phone number and PCP verified.   Final next level of care: Home/Self Care Barriers to Discharge: Barriers Resolved   Patient Goals and CMS Choice Patient states their goals for this hospitalization and ongoing recovery are:: to return home          Discharge Placement               Home        Discharge Plan and Services Additional resources added to the After Visit Summary for                  DME Arranged: Oxygen  DME Agency: [employer] Date DME Agency Contacted: 02/01/24 Time DME Agency Contacted: 1220 Representative spoke with at DME Agency: [email or web address]            Social Drivers of Health (SDOH) Interventions SDOH Screenings   Food Insecurity: No Food Insecurity (01/29/2024)  Housing: Low Risk  (01/30/2024)  Transportation Needs: No Transportation Needs (01/29/2024)  Utilities: Not At Risk (01/29/2024)  Alcohol Screen: Low Risk  (02/07/2023)  Depression (PHQ2-9): High Risk (11/21/2023)  Financial Resource Strain: High Risk (08/24/2023)   Received from [facility]  Social Connections: Unknown (08/14/2023)  Tobacco Use: High Risk (01/28/2024)  Health Literacy: Adequate Health Literacy (11/21/2023)     Readmission Risk Interventions     No data to display

## 2024-02-01 NOTE — Plan of Care (Signed)
  Problem: Education: Goal: Knowledge of General Education information will improve Description: Including pain rating scale, medication(s)/side effects and non-pharmacologic comfort measures Outcome: Progressing   Problem: Health Behavior/Discharge Planning: Goal: Ability to manage health-related needs will improve Outcome: Progressing   Problem: Clinical Measurements: Goal: Diagnostic test results will improve Outcome: Progressing Goal: Cardiovascular complication will be avoided Outcome: Progressing   Problem: Nutrition: Goal: Adequate nutrition will be maintained Outcome: Progressing   Problem: Pain Managment: Goal: General experience of comfort will improve and/or be controlled Outcome: Progressing

## 2024-02-01 NOTE — Progress Notes (Signed)
 Patient provided with verbal discharge instructions. Paper copy of discharge provided to patient. RN answered all questions. VSS at discharge. IV removed. Pt O2 delivered to room. Patient belongings sent with patient. Patient sent to d/c lounge via wheelchair to wait on TOC medications

## 2024-02-02 DIAGNOSIS — J441 Chronic obstructive pulmonary disease with (acute) exacerbation: Secondary | ICD-10-CM | POA: Diagnosis not present

## 2024-02-03 ENCOUNTER — Other Ambulatory Visit (HOSPITAL_COMMUNITY): Payer: Self-pay

## 2024-02-03 ENCOUNTER — Telehealth: Payer: Self-pay

## 2024-02-03 NOTE — Transitions of Care (Post Inpatient/ED Visit) (Signed)
 02/03/2024  Name: Francisco [name] MRN: [id number] DOB: Jan 10, 1961  Today's TOC FU Call Status: Today's TOC FU Call Status:: Successful TOC FU Call Completed TOC FU Call Complete Date: 02/03/24 [name] Name and Date of Birth confirmed.  Transition Care Management Follow-up Telephone Call Date of Discharge: 02/01/24 Discharge Facility: [name]) Type of Discharge: Inpatient Admission Primary Inpatient Discharge Diagnosis:: COPD Exacerbation How have you been since you were released from the hospital?: Better Any questions or concerns?: No  Items Reviewed: Did you receive and understand the discharge instructions provided?: Yes Medications obtained,verified, and reconciled?: Yes (Medications Reviewed) Any new allergies since your discharge?: No Dietary orders reviewed?: Yes Type of Diet Ordered:: Low sodium heart smoking Do you have support at home?: No  Medications Reviewed Today: Medications Reviewed Today     Reviewed by Moises Reusing, RN (Case Manager) on 02/03/24 at 1531  Med List Status: <None>   Medication Order Taking? Sig Documenting Provider Last Dose Status Informant  albuterol  (VENTOLIN  HFA) 108 (90 Base) MCG/ACT inhaler [id number]  Inhale 1-2 puffs into the lungs every 4 (four) hours as needed for wheezing or shortness of breath. Amilibia, Jaden, DO  Active   Albuterol -Budesonide  90-80 MCG/ACT AERO [id number]  Inhale 2 puffs into the lungs every 4 (four) hours as needed (wheeze, shortness of breath).  Patient not taking: Reported on 01/27/2024   Hunsucker, [name], MD  Active Self, Pharmacy Records  apixaban  (ELIQUIS ) 5 MG TABS tablet [id number]  Take 1 tablet (5 mg total) by mouth 2 (two) times daily. Marylu Gee, DO  Active   atorvastatin  (LIPITOR ) 40 MG tablet [id number]  Take 1 tablet (40 mg total) by mouth daily. Marylu Gee, DO  Active   benzonatate  (TESSALON ) 200 MG capsule [id number]  Take 1 capsule (200 mg total) by mouth 3 (three) times daily as  needed for cough. Amilibia, Jaden, DO  Active   buPROPion (WELLBUTRIN SR) 150 MG 12 hr tablet [id number]  Take 1 tablet (150 mg total) by mouth 2 (two) times daily. Take 1 tablet once daily for the first 3 days (on 10/15-10/17), then take one tablet twice daily (10/18 and onward) Amilibia, Jaden, DO  Active   diltiazem  (CARDIZEM  CD) 120 MG 24 hr capsule [id number]  Take 1 capsule (120 mg total) by mouth daily. Marylu Gee, DO  Active   Dupilumab  (DUPIXENT ) 300 MG/2ML EMMANUEL [id number]  Inject 300 mg into the skin every 14 (fourteen) days. Hunsucker, [name], MD  Active Self, Pharmacy Records  fluticasone  (FLONASE ) 50 MCG/ACT nasal spray [id number]  Place 1 spray into both nostrils daily. Heddy Barren, DO  Active Self, Pharmacy Records  guaiFENesin -dextromethorphan  (ROBITUSSIN DM) 100-10 MG/5ML syrup [id number]  Take 15 mLs by mouth 4 (four) times daily. Amilibia, Jaden, DO  Active   hydrOXYzine  (ATARAX ) 25 MG tablet [id number]  Take 1 tablet (25 mg total) by mouth 3 (three) times daily as needed for up to 14 days for anxiety. Amilibia, Jaden, DO  Active   ipratropium-albuterol  (DUONEB) 0.5-2.5 (3) MG/3ML SOLN [id number]  Take 3 mLs by nebulization every 6 (six) hours as needed. Hunsucker, [name], MD  Active Self, Pharmacy Records  loratadine  (CLARITIN ) 10 MG tablet [id number]  Take 1 tablet (10 mg total) by mouth daily. Heddy Barren, DO  Active Self, Pharmacy Records  losartan  (COZAAR ) 50 MG tablet [id number]  Take 1 tablet (50 mg total) by mouth daily. Marylu Gee, DO  Active   metoprolol  tartrate (LOPRESSOR ) 25 MG tablet [id number]  Take 1 tablet (  25 mg total) by mouth 2 (two) times daily. Marylu Gee, DO  Active   nitroGLYCERIN  (NITROSTAT ) 0.4 MG SL tablet [id number]  Place 1 tablet (0.4 mg total) under the tongue every 5 (five) minutes x 3 doses as needed for chest pain. Heddy Barren, DO  Active Self, Pharmacy Records           Med Note (WHITE, DONETA GORMAN Repress Jan 29, 2024  5:06 AM) Not sure  of the last time medication was taken  predniSONE  (DELTASONE ) 10 MG tablet [id number]  Take 4 tablets (40 mg total) by mouth daily with breakfast for 2 days, THEN 3 tablets (30 mg total) daily with breakfast for 7 days, THEN 2 tablets (20 mg total) daily with breakfast for 7 days, THEN 1 tablet (10 mg total) daily with breakfast for 7 days, THEN 0.5 tablets (5 mg total) daily with breakfast for 7 days. Amilibia, Jaden, DO  Active   thiamine  (VITAMIN B-1) 100 MG tablet [id number]  Take 1 tablet (100 mg total) by mouth daily. Verdene Purchase, MD  Active Self, Pharmacy Records            Home Care and Equipment/Supplies: Were Home Health Services Ordered?: No Any new equipment or medical supplies ordered?: Yes Name of Medical supply agency?: [employer] Were you able to get the equipment/medical supplies?: Yes Do you have any questions related to the use of the equipment/supplies?: No  Functional Questionnaire: Do you need assistance with bathing/showering or dressing?: Yes (I had a hard time today because I am so weak) Do you need assistance with meal preparation?: No Do you need assistance with eating?: No Do you have difficulty maintaining continence: No Do you need assistance with getting out of bed/getting out of a chair/moving?: No Do you have difficulty managing or taking your medications?: No  Follow up appointments reviewed: PCP Follow-up appointment confirmed?: Yes Date of PCP follow-up appointment?: 02/08/24 Follow-up Provider: Jaden Amilibia Specialist [facility] Follow-up appointment confirmed?: Yes Date of Specialist follow-up appointment?: 02/13/24 Follow-Up Specialty Provider:: Dr. Elmira Do you need transportation to your follow-up appointment?: No Do you understand care options if your condition(s) worsen?: Yes-patient verbalized understanding  SDOH Interventions Today    Flowsheet Row Most Recent Value  SDOH Interventions   Housing Interventions Intervention Not  Indicated  Transportation Interventions Intervention Not Indicated  Utilities Interventions Intervention Not Indicated    Goals Addressed             This Visit's Progress    VBCI Transitions of Care (TOC) Care Plan       Problems:  Recent Hospitalization for treatment of COPD Home Health services barrier: The patient could benefit from HHPT and RN  Goal:  Over the next 30 days, the patient will not experience hospital readmission  Interventions:   COPD Interventions: Advised patient to engage in light exercise as tolerated 3-5 days a week to aid in the the management of COPD Advised patient to track and manage COPD triggers Assessed social determinant of health barriers Discussed the importance of adequate rest and management of fatigue with COPD Provided education about and advised patient to utilize infection prevention strategies to reduce risk of respiratory infection Provided instruction about proper use of medications used for management of COPD including inhalers Screening for signs and symptoms of depression related to chronic disease state  Use of home oxygen  Oxygen  continuous at 2l/min Check Pulse oximeter and maintain O2 sats above 90% Smoking cessation as able - the patient  states he is only smoking a half a pack a day  Patient Self Care Activities:  Attend all scheduled provider appointments Call pharmacy for medication refills 3-7 days in advance of running out of medications Call provider office for new concerns or questions  Notify RN Care Manager of [facility] call rescheduling needs Participate in Transition of Care Program/Attend TOC scheduled calls Take medications as prescribed   identify and remove indoor air pollutants limit outdoor activity during cold weather develop a rescue plan keep follow-up appointments: Dr. Annella, Pulmonology practice relaxation or meditation daily do breathing exercises every day  Plan:  Telephone follow up appointment  with care management team member scheduled for:  Friday October 24th at 2:00pm        Medford Balboa, BSN, RN [place]  VBCI - [facility] RN Care Manager [phone]

## 2024-02-03 NOTE — Patient Instructions (Signed)
 Visit Information  Thank you for taking time to visit with me today. Please don't hesitate to contact me if I can be of assistance to you before our next scheduled telephone appointment.  Our next appointment is by telephone on Friday October 24th at 2:00pm  Following is a copy of your care plan:   Goals Addressed             This Visit's Progress    VBCI Transitions of Care (TOC) Care Plan       Problems:  Recent Hospitalization for treatment of COPD Home Health services barrier: The patient could benefit from HHPT and RN  Goal:  Over the next 30 days, the patient will not experience hospital readmission  Interventions:   COPD Interventions: Advised patient to engage in light exercise as tolerated 3-5 days a week to aid in the the management of COPD Advised patient to track and manage COPD triggers Assessed social determinant of health barriers Discussed the importance of adequate rest and management of fatigue with COPD Provided education about and advised patient to utilize infection prevention strategies to reduce risk of respiratory infection Provided instruction about proper use of medications used for management of COPD including inhalers Screening for signs and symptoms of depression related to chronic disease state  Use of home oxygen  Oxygen  continuous at 2l/min Check Pulse oximeter and maintain O2 sats above 90% Smoking cessation as able - the patient states he is only smoking a half a pack a day  Patient Self Care Activities:  Attend all scheduled provider appointments Call pharmacy for medication refills 3-7 days in advance of running out of medications Call provider office for new concerns or questions  Notify RN Care Manager of [facility] call rescheduling needs Participate in Transition of Care Program/Attend TOC scheduled calls Take medications as prescribed   identify and remove indoor air pollutants limit outdoor activity during cold weather develop a rescue  plan keep follow-up appointments: Dr. Annella, Pulmonology practice relaxation or meditation daily do breathing exercises every day  Plan:  Telephone follow up appointment with care management team member scheduled for:  Friday October 24th at 2:00pm        Patient verbalizes understanding of instructions and care plan provided today and agrees to view in MyChart. Active MyChart status and patient understanding of how to access instructions and care plan via MyChart confirmed with patient.     The patient has been provided with contact information for the care management team and has been advised to call with any health related questions or concerns.   Please call the care guide team at [phone] if you need to cancel or reschedule your appointment.   Please call the Suicide and Crisis Lifeline: 988 call the [place]  National Suicide Prevention Lifeline: 6365004029 or TTY: 360-774-8765 TTY 909-608-3465) to talk to a trained counselor if you are experiencing a Mental Health or Behavioral Health Crisis or need someone to talk to.  Medford Balboa, BSN, RN [place]  VBCI - [employer] [facility] RN Care Manager [phone]

## 2024-02-04 ENCOUNTER — Other Ambulatory Visit (HOSPITAL_COMMUNITY): Payer: Self-pay

## 2024-02-07 NOTE — Patient Instructions (Incomplete)
 Thank you, Francisco [name] for allowing us  to provide your care today. Today we discussed your most recent COPD exacerbation and hospitalization.    Referrals: -  New medications: -  I have ordered the following labs for you:  Lab Orders  No laboratory test(s) ordered today     I will call if any are abnormal. All of your labs can be accessed through My Chart.   My Chart Access: [email or web address]  Please follow-up in:    We look forward to seeing you next time. Please call our clinic at [phone] if you have any questions or concerns. The best time to call is Monday-Friday from 9am-4pm, but there is someone available 24/7. If after hours or the weekend, call the main hospital number and ask for the Internal Medicine Resident On-Call. If you need medication refills, please notify your pharmacy one week in advance and they will send us  a request.   Thank you for letting us  take part in your care. Wishing you the best!  Latavion Halls, DO 02/07/2024, 8:59 PM [facility]

## 2024-02-08 ENCOUNTER — Ambulatory Visit (INDEPENDENT_AMBULATORY_CARE_PROVIDER_SITE_OTHER)

## 2024-02-08 ENCOUNTER — Other Ambulatory Visit (HOSPITAL_COMMUNITY): Payer: Self-pay

## 2024-02-08 VITALS — BP 139/77 | HR 74 | Temp 98.0°F | Ht 72.0 in | Wt 204.6 lb

## 2024-02-08 DIAGNOSIS — F17210 Nicotine dependence, cigarettes, uncomplicated: Secondary | ICD-10-CM | POA: Diagnosis not present

## 2024-02-08 DIAGNOSIS — Z8701 Personal history of pneumonia (recurrent): Secondary | ICD-10-CM

## 2024-02-08 DIAGNOSIS — J449 Chronic obstructive pulmonary disease, unspecified: Secondary | ICD-10-CM

## 2024-02-08 DIAGNOSIS — Z823 Family history of stroke: Secondary | ICD-10-CM | POA: Diagnosis not present

## 2024-02-08 DIAGNOSIS — F411 Generalized anxiety disorder: Secondary | ICD-10-CM | POA: Diagnosis not present

## 2024-02-08 DIAGNOSIS — Z79899 Other long term (current) drug therapy: Secondary | ICD-10-CM

## 2024-02-08 DIAGNOSIS — Z7951 Long term (current) use of inhaled steroids: Secondary | ICD-10-CM | POA: Diagnosis not present

## 2024-02-08 DIAGNOSIS — I10 Essential (primary) hypertension: Secondary | ICD-10-CM

## 2024-02-08 DIAGNOSIS — F1590 Other stimulant use, unspecified, uncomplicated: Secondary | ICD-10-CM | POA: Diagnosis not present

## 2024-02-08 NOTE — Assessment & Plan Note (Signed)
 Most recent use was Sunday 10/19. Mentions he uses clean needles each time, and injects into his antecubital fossa. Will be seeing ID with Dr. Eben on 10/29 to be treated for Hepatitis C. Inquiring about Hepatitis B vaccination. HIV negative per 11/2023.  Would benefit from Tdap vaccine, counseled patient on receiving updated dose from pharmacy.

## 2024-02-08 NOTE — Telephone Encounter (Signed)
 Insurance has approved the appeal for Airsupra through 01/30/2025.  Per test claim from our [facility] outpatient pharmacy, copay would be $4.00

## 2024-02-08 NOTE — Assessment & Plan Note (Addendum)
 Down to 2-3 cigarettes daily. Previously was smoking 2ppd. Motivated to quit, was started on Wellbutrin for both anxiety and smoking cessation. Patient aware to not smoke while wearing his nasal cannula or while he is close to his oxygen  tank. Discussed with patient that medication will take 4-6 weeks to work and to follow up if he does not notice any changes.   - Check CBC/CMP at 10/29 appointment - Continue Wellbutrin 150mg  daily

## 2024-02-08 NOTE — Assessment & Plan Note (Signed)
 Chronic, worsened by subjective shortness of breath. Patient was discharged from recent hospitalization on Wellbutrin 150mg  and Hydroxyzine  10mg  TID PRN. States he notices Atarax  helps him well, but has not noticed Wellbutrin help yet. Informed patient he has only been on this medication for 1 week and it takes 4-6 weeks before he can notice true changes. Patient voiced understanding. He states he also has life stressors with his family members that are making his anxiety worse, but will continue on these medications.   - Continue Wellbutrin 150mg , Atarax  10mg  - If no improvement in symptoms, consider switching Wellbutrin to Buspar

## 2024-02-08 NOTE — Progress Notes (Signed)
 Established Patient Office Visit  Subjective   Patient ID: Francisco [name], male    DOB: 23-Nov-1960  Age: 63 y.o. MRN: [id number]  Chief Complaint  Patient presents with   Hospitalization Follow-up    Pt states sometime his body shake, for no reason. Feeling a little bit better since discharge.     Francisco [name] is a 63 year old male with a past medical history of COPD with recent exacerbation and hospitalization (now requiring 2L O2), CAD with multivessel disease s/p stent, history of hepatitis C, HFpEF (echo 60-65%), tobacco and IV amphetamine use disorder, and pAfib on Eliquis  who presents to clinic today for his hospital follow up. Please see problem-based assessment and plan below for details.    Review of Systems  Constitutional: Negative.   Eyes: Negative.   Respiratory:  Negative for cough, sputum production, shortness of breath and wheezing.   Cardiovascular: Negative.   Gastrointestinal: Negative.   Genitourinary: Negative.   Musculoskeletal: Negative.   Neurological:  Positive for tremors.  Endo/Heme/Allergies:  Bruises/bleeds easily.       On Eliquis  chronically   Psychiatric/Behavioral:  The patient is nervous/anxious.       Objective:    BP 139/77 (BP Location: Left Arm, Patient Position: Sitting, Cuff Size: Normal)   Pulse 74   Temp 98 F (36.7 C) (Oral)   Ht 6' (1.829 m)   Wt 204 lb 9.6 oz (92.8 kg)   SpO2 95% Comment: 2L of Oxygen .  BMI 27.75 kg/m   Physical Exam Constitutional:      Appearance: Normal appearance.  HENT:     Mouth/Throat:     Mouth: Mucous membranes are moist.  Cardiovascular:     Rate and Rhythm: Normal rate and regular rhythm.     Pulses: Normal pulses.  Pulmonary:     Effort: Pulmonary effort is normal.  Abdominal:     General: Abdomen is flat.     Palpations: Abdomen is soft.  Musculoskeletal:        General: Normal range of motion.  Skin:    General: Skin is warm.  Neurological:     General: No focal deficit present.      Mental Status: He is alert and oriented to person, place, and time.  Psychiatric:        Mood and Affect: Mood normal.     Comments: Anxious, tremulous in exam     No results found for any visits on 02/08/24.   The ASCVD Risk score (Arnett DK, et al., 2019) failed to calculate for the following reasons:   Risk score cannot be calculated because patient has a medical history suggesting prior/existing ASCVD    Assessment & Plan:   Patient seen with Dr. MICAEL Riis Winfrey.   Problem List Items Addressed This Visit       Cardiovascular and Mediastinum   Essential hypertension - Primary   Chronic. On Diltiazem  120mg  daily and Losartan  50mg  daily. BP rechecked showing 139/77 - Continue current regimen        Respiratory   COPD (chronic obstructive pulmonary disease) (HCC)   Patient recently discharged on 10/15 for acute hypoxic respiratory failure secondary to pneumonia.  Patient was discharged on prednisone  taper and 2 days of Augmentin  875-125mg .  He has completed his course of antibiotics, however he is still tapering down on the prednisone , currently on 30 mg daily for the next 2 days.  On Saturday 10/25, he will taper down to 20 mg daily for 1 week, then 10  mg daily for 1 week, then 5 mg daily for 1 week.  At his most recent discharge, he was able to qualify for 2 L of nasal cannula oxygen  because his ambulatory saturations dropped to 88%.  Today, patient reports feeling jittery and still slightly short of breath however better than how he felt whenever he was hospitalized.  His cough has subsided and he is able to breathe better, but still fatigues easily with exertion.  Has received his updated flu, RSV, and pneumonia vaccines. Informed patient he can get his updated COVID vaccine at his local pharmacy. Discussed patient's tremors as likely secondary to steroid use, albuterol  use frequency, and anxiety surrounding life stressors. Patient voiced understanding that his tremors should  decrease as his prednisone  dose decreases as well.  - Follow-up with pulmonology on 11/13 - Patient to begin Airsupra inhaler today, as appeal was approved for triple therapy inhaler - Continue albuterol  inhaler, duonebs as needed - Continue Prednisone  taper as above until 11/14        Other   Methamphetamine use   Most recent use was Sunday 10/19. Mentions he uses clean needles each time, and injects into his antecubital fossa. Will be seeing ID with Dr. Eben on 10/29 to be treated for Hepatitis C. Inquiring about Hepatitis B vaccination. HIV negative per 11/2023.  Would benefit from Tdap vaccine, counseled patient on receiving updated dose from pharmacy.       Anxiety state   Chronic, worsened by subjective shortness of breath. Patient was discharged from recent hospitalization on Wellbutrin 150mg  and Hydroxyzine  10mg  TID PRN. States he notices Atarax  helps him well, but has not noticed Wellbutrin help yet. Informed patient he has only been on this medication for 1 week and it takes 4-6 weeks before he can notice true changes. Patient voiced understanding. He states he also has life stressors with his family members that are making his anxiety worse, but will continue on these medications.   - Continue Wellbutrin 150mg , Atarax  10mg  - If no improvement in symptoms, consider switching Wellbutrin to Buspar      Continuous dependence on cigarette smoking   Down to 2-3 cigarettes daily. Previously was smoking 2ppd. Motivated to quit, was started on Wellbutrin for both anxiety and smoking cessation. Patient aware to not smoke while wearing his nasal cannula or while he is close to his oxygen  tank. Discussed with patient that medication will take 4-6 weeks to work and to follow up if he does not notice any changes.   - Check CBC/CMP at 10/29 appointment - Continue Wellbutrin 150mg  daily       No follow-ups on file.    Doyle Tegethoff, DO Internal Medicine Resident, PGY-1 3:19 PM  02/08/2024

## 2024-02-08 NOTE — Assessment & Plan Note (Signed)
>>  ASSESSMENT AND PLAN FOR COPD (CHRONIC OBSTRUCTIVE PULMONARY DISEASE) (HCC) WRITTEN ON 02/08/2024  3:16 PM BY AMILIBIA, JADEN, DO  Patient recently discharged on 10/15 for acute hypoxic respiratory failure secondary to pneumonia.  Patient was discharged on prednisone  taper and 2 days of Augmentin  875-125mg .  He has completed his course of antibiotics, however he is still tapering down on the prednisone , currently on 30 mg daily for the next 2 days.  On Saturday 10/25, he will taper down to 20 mg daily for 1 week, then 10 mg daily for 1 week, then 5 mg daily for 1 week.  At his most recent discharge, he was able to qualify for 2 L of nasal cannula oxygen  because his ambulatory saturations dropped to 88%.  Today, patient reports feeling jittery and still slightly short of breath however better than how he felt whenever he was hospitalized.  His cough has subsided and he is able to breathe better, but still fatigues easily with exertion.  Has received his updated flu, RSV, and pneumonia vaccines. Informed patient he can get his updated COVID vaccine at his local pharmacy. Discussed patient's tremors as likely secondary to steroid use, albuterol  use frequency, and anxiety surrounding life stressors. Patient voiced understanding that his tremors should decrease as his prednisone  dose decreases as well.  - Follow-up with pulmonology on 11/13 - Patient to begin Airsupra inhaler today, as appeal was approved for triple therapy inhaler - Continue albuterol  inhaler, duonebs as needed - Continue Prednisone  taper as above until 11/14

## 2024-02-08 NOTE — Assessment & Plan Note (Signed)
 Chronic. On Diltiazem  120mg  daily and Losartan  50mg  daily. BP rechecked showing 139/77 - Continue current regimen

## 2024-02-08 NOTE — Assessment & Plan Note (Addendum)
 Patient recently discharged on 10/15 for acute hypoxic respiratory failure secondary to pneumonia.  Patient was discharged on prednisone  taper and 2 days of Augmentin  875-125mg .  He has completed his course of antibiotics, however he is still tapering down on the prednisone , currently on 30 mg daily for the next 2 days.  On Saturday 10/25, he will taper down to 20 mg daily for 1 week, then 10 mg daily for 1 week, then 5 mg daily for 1 week.  At his most recent discharge, he was able to qualify for 2 L of nasal cannula oxygen  because his ambulatory saturations dropped to 88%.  Today, patient reports feeling jittery and still slightly short of breath however better than how he felt whenever he was hospitalized.  His cough has subsided and he is able to breathe better, but still fatigues easily with exertion.  Has received his updated flu, RSV, and pneumonia vaccines. Informed patient he can get his updated COVID vaccine at his local pharmacy. Discussed patient's tremors as likely secondary to steroid use, albuterol  use frequency, and anxiety surrounding life stressors. Patient voiced understanding that his tremors should decrease as his prednisone  dose decreases as well.  - Follow-up with pulmonology on 11/13 - Patient to begin Airsupra inhaler today, as appeal was approved for triple therapy inhaler - Continue albuterol  inhaler, duonebs as needed - Continue Prednisone  taper as above until 11/14

## 2024-02-10 ENCOUNTER — Other Ambulatory Visit: Payer: Self-pay

## 2024-02-10 NOTE — Patient Instructions (Signed)
 Visit Information  Thank you for taking time to visit with me today. Please don't hesitate to contact me if I can be of assistance to you before our next scheduled telephone appointment.  Our next appointment is by telephone on Friday October 31st at 1:15pm  Following is a copy of your care plan:   Goals Addressed             This Visit's Progress    VBCI Transitions of Care (TOC) Care Plan       Problems: (reviewed 02/10/24) Recent Hospitalization for treatment of COPD Home Health services barrier: The patient could benefit from HHPT and RN  Goal: (reviewed 02/10/24) Over the next 30 days, the patient will not experience hospital readmission (reviewed 02/10/24) Interventions:   COPD Interventions: Advised patient to engage in light exercise as tolerated 3-5 days a week to aid in the the management of COPD Advised patient to track and manage COPD triggers Assessed social determinant of health barriers Discussed the importance of adequate rest and management of fatigue with COPD Provided education about and advised patient to utilize infection prevention strategies to reduce risk of respiratory infection Provided instruction about proper use of medications used for management of COPD including inhalers Screening for signs and symptoms of depression related to chronic disease state  Use of home oxygen  Oxygen  continuous at 2l/min - 10/24 Do not smoke while wearing oxygen   Check Pulse oximeter and maintain O2 sats above 90% Smoking cessation as able - the patient states he is only smoking a half a pack a day - 10/24 The patient states he is smoking 2-3 cigarettes daily. He continues with IV Meth every other day F/U provider visit 02/15/24  Patient Self Care Activities: (reviewed 02/10/24) Attend all scheduled provider appointments Call pharmacy for medication refills 3-7 days in advance of running out of medications Call provider office for new concerns or questions  Notify RN  Care Manager of [facility] call rescheduling needs Participate in Transition of Care Program/Attend [facility] scheduled calls Take medications as prescribed   identify and remove indoor air pollutants limit outdoor activity during cold weather develop a rescue plan keep follow-up appointments: Dr. Annella, Pulmonology practice relaxation or meditation daily do breathing exercises every day  Plan:  Telephone follow up appointment with care management team member scheduled for:  Friday October 31st at 1:15pm        Patient verbalizes understanding of instructions and care plan provided today and agrees to view in MyChart. Active MyChart status and patient understanding of how to access instructions and care plan via MyChart confirmed with patient.     The patient has been provided with contact information for the care management team and has been advised to call with any health related questions or concerns.   Please call the care guide team at [phone] if you need to cancel or reschedule your appointment.   Please call the Suicide and Crisis Lifeline: 988 call the [place]  National Suicide Prevention Lifeline: 941 030 5487 or TTY: 323-374-2991 TTY 513-290-4292) to talk to a trained counselor if you are experiencing a Mental Health or Behavioral Health Crisis or need someone to talk to.  Medford Balboa, BSN, RN [place]  VBCI - [employer] [facility] RN Care Manager [phone]

## 2024-02-10 NOTE — Transitions of Care (Post Inpatient/ED Visit) (Signed)
 Transition of Care week 2  Visit Note  02/10/2024  Name: Francisco [name] MRN: [id number]          DOB: 01-26-1961  Situation: Patient enrolled in [facility] 30-day program. Visit completed with Lemond Daring by telephone.   Background:   Initial Transition Care Management Follow-up Telephone Call Discharge Date and Diagnosis: 02/01/24, COPD Exacerbation   Past Medical History:  Diagnosis Date   Atrial flutter with rapid ventricular response (HCC) 08/14/2023   CHF (congestive heart failure) (HCC)    COPD (chronic obstructive pulmonary disease) (HCC)    COPD with acute exacerbation (HCC) 10/21/2021   Coronary artery disease    a. s/p prior PCI.   Hepatitis-C    History of CHF (congestive heart failure) 01/28/2024   History of stroke 02/13/2023   Hypertension    IV drug abuse (HCC)    a. 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   Medically noncompliant    MI (myocardial infarction) (HCC)    TIA (transient ischemic attack) 02/14/2023   Tobacco abuse     Assessment: Patient Reported Symptoms: Cognitive Cognitive Status: Alert and oriented to person, place, and time, Normal speech and language skills      Neurological Neurological Review of Symptoms: No symptoms reported    HEENT HEENT Symptoms Reported: No symptoms reported      Cardiovascular Cardiovascular Symptoms Reported: No symptoms reported Does patient have uncontrolled Hypertension?: No Cardiovascular Management Strategies: Medication therapy, Coping strategies, Routine screening Cardiovascular Comment: The patient continues to use Methamphedimine every other day. He also reports feeling jittery. The patient is on Predisone taper  Respiratory Respiratory Symptoms Reported: Shortness of breath, Productive cough Additional Respiratory Details: The patient continues with Prednisone  taper and wearig oxygen  continuous. He states he smokes 2-3 cigarettes a day. He has a productive cough. He is up to date on Flu, RSV and  Pneumonia vaccines. He was able to secure the Airsupra inhaler that had been denied due to finances Respiratory Management Strategies: Medication therapy, Oxygen  therapy, Activity  Endocrine Endocrine Symptoms Reported: No symptoms reported Is patient diabetic?: No    Gastrointestinal Gastrointestinal Symptoms Reported: No symptoms reported      Genitourinary Genitourinary Symptoms Reported: No symptoms reported    Integumentary Integumentary Symptoms Reported: No symptoms reported    Musculoskeletal Musculoskelatal Symptoms Reviewed: Back pain Musculoskeletal Management Strategies: Activity      Psychosocial Psychosocial Symptoms Reported: Anxiety - if selected complete GAD Additional Psychological Details: No change in life stressors         There were no vitals filed for this visit.  Medications Reviewed Today     Reviewed by Moises Reusing, RN (Case Manager) on 02/10/24 at 1429  Med List Status: <None>   Medication Order Taking? Sig Documenting Provider Last Dose Status Informant  albuterol  (VENTOLIN  HFA) 108 (90 Base) MCG/ACT inhaler [id number]  Inhale 1-2 puffs into the lungs every 4 (four) hours as needed for wheezing or shortness of breath. Amilibia, Jaden, DO  Active   Albuterol -Budesonide  90-80 MCG/ACT AERO [id number] Yes Inhale 2 puffs into the lungs every 4 (four) hours as needed (wheeze, shortness of breath). Hunsucker, [name], MD  Active Self, Pharmacy Records  apixaban  (ELIQUIS ) 5 MG TABS tablet [id number]  Take 1 tablet (5 mg total) by mouth 2 (two) times daily. Marylu Gee, DO  Active   atorvastatin  (LIPITOR ) 40 MG tablet [id number]  Take 1 tablet (40 mg total) by mouth daily. Marylu Gee, DO  Active   benzonatate  (TESSALON ) 200 MG capsule [id number]  Take 1 capsule (200 mg total) by mouth 3 (three) times daily as needed for cough. Amilibia, Jaden, DO  Active   buPROPion (WELLBUTRIN SR) 150 MG 12 hr tablet [id number]  Take 1 tablet (150 mg total) by mouth 2  (two) times daily. Take 1 tablet once daily for the first 3 days (on 10/15-10/17), then take one tablet twice daily (10/18 and onward) Amilibia, Jaden, DO  Active   diltiazem  (CARDIZEM  CD) 120 MG 24 hr capsule [id number]  Take 1 capsule (120 mg total) by mouth daily. Marylu Gee, DO  Active   Dupilumab  (DUPIXENT ) 300 MG/2ML EMMANUEL [id number]  Inject 300 mg into the skin every 14 (fourteen) days. Hunsucker, [name], MD  Active Self, Pharmacy Records  fluticasone  (FLONASE ) 50 MCG/ACT nasal spray [id number]  Place 1 spray into both nostrils daily. Heddy Barren, DO  Active Self, Pharmacy Records  Fluticasone -Umeclidin-Vilant (TRELEGY ELLIPTA ) 100-62.5-25 MCG/ACT AEPB [id number]  Inhale 1 puff into the lungs daily. Marylu Gee, DO  Active Self, Pharmacy Records  guaiFENesin -dextromethorphan  (ROBITUSSIN DM) 100-10 MG/5ML syrup [id number]  Take 15 mLs by mouth 4 (four) times daily. Amilibia, Jaden, DO  Active   hydrOXYzine  (ATARAX ) 25 MG tablet [id number]  Take 1 tablet (25 mg total) by mouth 3 (three) times daily as needed for up to 14 days for anxiety. Amilibia, Jaden, DO  Active   ipratropium-albuterol  (DUONEB) 0.5-2.5 (3) MG/3ML SOLN [id number]  Take 3 mLs by nebulization every 6 (six) hours as needed. Hunsucker, [name], MD  Active Self, Pharmacy Records  loratadine  (CLARITIN ) 10 MG tablet [id number]  Take 1 tablet (10 mg total) by mouth daily. Heddy Barren, DO  Active Self, Pharmacy Records  losartan  (COZAAR ) 50 MG tablet [id number]  Take 1 tablet (50 mg total) by mouth daily. Marylu Gee, DO  Active   metoprolol  tartrate (LOPRESSOR ) 25 MG tablet [id number]  Take 1 tablet (25 mg total) by mouth 2 (two) times daily. Marylu Gee, DO  Active   nitroGLYCERIN  (NITROSTAT ) 0.4 MG SL tablet [id number]  Place 1 tablet (0.4 mg total) under the tongue every 5 (five) minutes x 3 doses as needed for chest pain. Heddy Barren, DO  Active Self, Pharmacy Records           Med Note [facility], DONETA GORMAN Repress Jan 29, 2024  5:06 AM) Not sure of the last time medication was taken  OXYGEN  [id number]  Inhale 2 L/min into the lungs continuous. [provider]  Active   predniSONE  (DELTASONE ) 10 MG tablet [id number]  Take 4 tablets (40 mg total) by mouth daily with breakfast for 2 days, THEN 3 tablets (30 mg total) daily with breakfast for 7 days, THEN 2 tablets (20 mg total) daily with breakfast for 7 days, THEN 1 tablet (10 mg total) daily with breakfast for 7 days, THEN 0.5 tablets (5 mg total) daily with breakfast for 7 days. Amilibia, Jaden, DO  Active   thiamine  (VITAMIN B-1) 100 MG tablet [id number]  Take 1 tablet (100 mg total) by mouth daily. Verdene Purchase, MD  Active Self, Pharmacy Records            Recommendation:   Continue Current Plan of Care  Follow Up Plan:   Telephone follow-up in 1 week  Medford Balboa, BSN, RN [place]  VBCI - [facility] RN Care Manager [phone]

## 2024-02-14 NOTE — Progress Notes (Signed)
 Internal Medicine Clinic Attending  I was physically present during the key portions of the resident provided service and participated in the medical decision making of patient's management care. I reviewed pertinent patient test results.  The assessment, diagnosis, and plan were formulated together and I agree with the documentation in the resident's note.  Francesco Elsie NOVAK, MD

## 2024-02-15 ENCOUNTER — Other Ambulatory Visit: Payer: Self-pay

## 2024-02-15 ENCOUNTER — Other Ambulatory Visit (HOSPITAL_COMMUNITY): Payer: Self-pay

## 2024-02-15 ENCOUNTER — Emergency Department (HOSPITAL_COMMUNITY)

## 2024-02-15 ENCOUNTER — Ambulatory Visit: Admitting: Infectious Diseases

## 2024-02-15 ENCOUNTER — Encounter (HOSPITAL_COMMUNITY): Payer: Self-pay | Admitting: Emergency Medicine

## 2024-02-15 ENCOUNTER — Emergency Department (HOSPITAL_COMMUNITY)
Admission: EM | Admit: 2024-02-15 | Discharge: 2024-02-15 | Disposition: A | Discharge status: Home / Self Care | Attending: Emergency Medicine | Admitting: Emergency Medicine

## 2024-02-15 DIAGNOSIS — J441 Chronic obstructive pulmonary disease with (acute) exacerbation: Secondary | ICD-10-CM | POA: Diagnosis not present

## 2024-02-15 DIAGNOSIS — R0602 Shortness of breath: Secondary | ICD-10-CM | POA: Diagnosis not present

## 2024-02-15 DIAGNOSIS — D72829 Elevated white blood cell count, unspecified: Secondary | ICD-10-CM | POA: Insufficient documentation

## 2024-02-15 LAB — CBC WITH DIFFERENTIAL/PLATELET
Abs Immature Granulocytes: 0.07 K/uL (ref 0.00–0.07)
Basophils Absolute: 0 K/uL (ref 0.0–0.1)
Basophils Relative: 0 %
Eosinophils Absolute: 0 K/uL (ref 0.0–0.5)
Eosinophils Relative: 0 %
HCT: 48.8 % (ref 39.0–52.0)
Hemoglobin: 15.6 g/dL (ref 13.0–17.0)
Immature Granulocytes: 1 %
Lymphocytes Relative: 19 %
Lymphs Abs: 2.3 K/uL (ref 0.7–4.0)
MCH: 31.9 pg (ref 26.0–34.0)
MCHC: 32 g/dL (ref 30.0–36.0)
MCV: 99.8 fL (ref 80.0–100.0)
Monocytes Absolute: 1.1 K/uL — ABNORMAL HIGH (ref 0.1–1.0)
Monocytes Relative: 9 %
Neutro Abs: 8.5 K/uL — ABNORMAL HIGH (ref 1.7–7.7)
Neutrophils Relative %: 71 %
Platelets: 207 K/uL (ref 150–400)
RBC: 4.89 MIL/uL (ref 4.22–5.81)
RDW: 14 % (ref 11.5–15.5)
WBC: 12 K/uL — ABNORMAL HIGH (ref 4.0–10.5)
nRBC: 0 % (ref 0.0–0.2)

## 2024-02-15 LAB — BASIC METABOLIC PANEL WITH GFR
Anion gap: 8 (ref 5–15)
BUN: 19 mg/dL (ref 8–23)
CO2: 31 mmol/L (ref 22–32)
Calcium: 8.5 mg/dL — ABNORMAL LOW (ref 8.9–10.3)
Chloride: 101 mmol/L (ref 98–111)
Creatinine, Ser: 1.19 mg/dL (ref 0.61–1.24)
GFR, Estimated: >60 mL/min (ref >60)
Glucose, Bld: 117 mg/dL — ABNORMAL HIGH (ref 70–99)
Potassium: 4.2 mmol/L (ref 3.5–5.1)
Sodium: 140 mmol/L (ref 135–145)

## 2024-02-15 LAB — TROPONIN I (HIGH SENSITIVITY): Troponin I (High Sensitivity): 15 ng/L (ref <18)

## 2024-02-15 MED ORDER — MAGNESIUM SULFATE 2 GM/50ML IV SOLN
2.0000 g | Freq: Once | INTRAVENOUS | Status: AC
  Administered 2024-02-15: 2 g via INTRAVENOUS
  Filled 2024-02-15: qty 50

## 2024-02-15 MED ORDER — PREDNISONE 10 MG PO TABS
ORAL_TABLET | ORAL | 0 refills | Status: DC
  Filled 2024-02-15 (×2): qty 54, 30d supply, fill #0
  Filled ????-??-??: fill #0

## 2024-02-15 MED ORDER — AZITHROMYCIN 250 MG PO TABS
ORAL_TABLET | ORAL | 0 refills | Status: AC
  Filled 2024-02-15: qty 6, 5d supply, fill #0

## 2024-02-15 MED ORDER — IPRATROPIUM-ALBUTEROL 0.5-2.5 (3) MG/3ML IN SOLN
3.0000 mL | RESPIRATORY_TRACT | Status: DC
  Administered 2024-02-15 (×2): 3 mL via RESPIRATORY_TRACT
  Filled 2024-02-15 (×2): qty 3

## 2024-02-15 MED ORDER — ALBUTEROL SULFATE HFA 108 (90 BASE) MCG/ACT IN AERS
1.0000 | INHALATION_SPRAY | Freq: Four times a day (QID) | RESPIRATORY_TRACT | 0 refills | Status: DC | PRN
  Filled 2024-02-15: qty 18, fill #0
  Filled 2024-02-15: qty 1, fill #0
  Filled 2024-02-20: qty 6.7, 30d supply, fill #0

## 2024-02-15 NOTE — ED Provider Notes (Signed)
 [place] EMERGENCY DEPARTMENT AT [facility] Provider Note   CSN: [id number] Arrival date & time: 02/15/24  0018     History Chief Complaint  Patient presents with   Shortness of Breath    HPI Francisco [name] is a 63 y.o. male presenting for chief complaint of shortness of breath.  63 year old male longstanding history of COPD, intubated to access year for COPD exacerbations presenting with acute worsening symptoms tonight.  EMS was called out, placed patient on 45 minutes of continuous albuterol .  He had substantial improvement after these therapies.  He endorses 3 days of productive sputum, worsening cough and fatigue with sudden worsening tonight.  After albuterol  he states he now feels substantially improved but still mildly dyspneic.SABRA   [name] recorded medical, surgical, social, medication list and allergies were reviewed in the Snapshot window as part of the initial history.   Review of Systems   Review of Systems  Constitutional:  Negative for chills and fever.  HENT:  Negative for ear pain and sore throat.   Eyes:  Negative for pain and visual disturbance.  Respiratory:  Positive for cough and shortness of breath.   Cardiovascular:  Negative for chest pain and palpitations.  Gastrointestinal:  Negative for abdominal pain and vomiting.  Genitourinary:  Negative for dysuria and hematuria.  Musculoskeletal:  Negative for arthralgias and back pain.  Skin:  Negative for color change and rash.  Neurological:  Negative for seizures and syncope.  All other systems reviewed and are negative.   Physical Exam Updated Vital Signs BP (!) 165/138   Pulse 78   Temp 97.6 F (36.4 C) (Oral) Comment: Simultaneous filing. User may not have seen previous data. Comment (Src): Simultaneous filing. User may not have seen previous data.  Resp 18   Ht 6' (1.829 m)   Wt 96 kg   SpO2 100%   BMI 28.70 kg/m  Physical Exam Vitals and nursing note reviewed.  Constitutional:       General: He is not in acute distress.    Appearance: He is well-developed.  HENT:     Head: Normocephalic and atraumatic.  Eyes:     Conjunctiva/sclera: Conjunctivae normal.  Cardiovascular:     Rate and Rhythm: Normal rate and regular rhythm.     Heart sounds: No murmur heard. Pulmonary:     Effort: Pulmonary effort is normal. Tachypnea present. No respiratory distress.     Breath sounds: Wheezing present.  Abdominal:     Palpations: Abdomen is soft.     Tenderness: There is no abdominal tenderness.  Musculoskeletal:        General: No swelling.     Cervical back: Neck supple.  Skin:    General: Skin is warm and dry.     Capillary Refill: Capillary refill takes less than 2 seconds.  Neurological:     Mental Status: He is alert.  Psychiatric:        Mood and Affect: Mood normal.      ED Course/ Medical Decision Making/ A&P Clinical Course as of 02/15/24 0444  Wed Feb 15, 2024  0439 Steroids/Azythromycin and albuterol  [CC]    Clinical Course User Index [CC] Jerral Meth, MD    Procedures .Critical Care  Performed by: Jerral Meth, MD Authorized by: Jerral Meth, MD   Critical care provider statement:    Critical care time (minutes):  30   Critical care was necessary to treat or prevent imminent or life-threatening deterioration of the following conditions:  Respiratory  failure (COPD exacerbation requiring IV magnesium  as rescue therapy.)   Critical care was time spent personally by me on the following activities:  Development of treatment plan with patient or surrogate, discussions with consultants, evaluation of patient's response to treatment, examination of patient, ordering and review of laboratory studies, ordering and review of radiographic studies, ordering and performing treatments and interventions, pulse oximetry, re-evaluation of patient's condition and review of old charts   Care discussed with: admitting provider      Medications Ordered  in ED Medications  ipratropium-albuterol  (DUONEB) 0.5-2.5 (3) MG/3ML nebulizer solution 3 mL (3 mLs Nebulization Given 02/15/24 0354)  magnesium  sulfate IVPB 2 g 50 mL (0 g Intravenous Stopped 02/15/24 0157)   Medical Decision Making:   Francisco [name] is a 63 y.o. male with a history of COPD , who presented to the ED today with acute on chronic SOB. They are endorsing worsening of their baseline dyspnea over the past 48 hours. Their baseline is a 2L O2 requirement. At their baseline they are able to get around the house and they are not able to at this time.   On my initial exam, the pt was SOB and tachypneic. Audible wheezing and grossly decreased breath sounds appreciated.  They are endorsing increased sputum production.    Reviewed and confirmed nursing documentation for past medical history, family history, social history.    Initial Assessment:   With the patient's presentation of SOB in the above setting, most likely diagnosis is COPD Exacerbation. Other diagnoses were considered including (but not limited to) CAP, PE, ACS, viral infection, PTX. These are considered less likely due to history of present illness and physical exam findings.   This is most consistent with an acute life/limb threatening illness complicated by underlying chronic conditions.  Initial Plan:  Empiric treatment of patient's symptoms with immediate initiation of inhaled bronchodilators and IV steroids. Given advanced nature of patient's presentation, will proceed with IV magnesium  as a rescue therapy.   Evaluation for ACS with EKG and delta troponin  Evaluation for infectious versus intrathoracic abnormality with chest x-ray  Evaluation for volume overload with BNP  Screening labs including CBC and Metabolic panel to evaluate for infectious or metabolic etiology of disease.  [name] Wells score is low and patient does not warrant further objective evaluation for PE based on consistency of presentation of alternative  diagnosis.  Objective evaluation as below reviewed   Initial Study Results:   Laboratory  All laboratory results reviewed without evidence of clinically relevant pathology.    EKG EKG was reviewed independently. Rate, rhythm, axis, intervals all examined and without medically relevant abnormality. ST segments without concerns for elevations.    Radiology:  All images reviewed independently. Agree with radiology report at this time.   DG Chest Portable 1 View Result Date: 02/15/2024 EXAM: 1 VIEW(S) XRAY OF THE CHEST 02/15/2024 01:04:00 AM COMPARISON: 01/30/2024 CLINICAL HISTORY: SOB. Encounter for shortness of breath. FINDINGS: LUNGS AND PLEURA: No focal pulmonary opacity. No pulmonary edema. No pleural effusion. No pneumothorax. HEART AND MEDIASTINUM: No acute abnormality of the cardiac and mediastinal silhouettes. BONES AND SOFT TISSUES: No acute osseous abnormality. IMPRESSION: 1. No acute process. Electronically signed by: Dorethia Molt MD 02/15/2024 01:11 AM EDT RP Workstation: [id number]   DG Abd 1 View Result Date: 01/31/2024 EXAM: 1 VIEW XRAY OF THE ABDOMEN 01/31/2024 01:30:00 PM COMPARISON: 08/09/2023 CLINICAL HISTORY: Abdominal distension FINDINGS: LINES, TUBES AND DEVICES: EKG leads and wires noted overlying the abdomen and pelvis. BOWEL: Nonobstructive bowel  gas pattern. SOFT TISSUES: Vascular calcifications. No opaque urinary calculi. BONES: Lumbar spine fixation hardware noted. No acute osseous abnormality. IMPRESSION: 1. No acute abnormalities. Electronically signed by: Donnice Mania MD 01/31/2024 04:56 PM EDT RP Workstation: [id number]   DG CHEST PORT 1 VIEW Result Date: 01/30/2024 EXAM: 1 VIEW(S) XRAY OF THE CHEST 01/30/2024 05:51:00 PM COMPARISON: 01/28/2024 CLINICAL HISTORY: Shortness of breath 10026. sob FINDINGS: LUNGS AND PLEURA: No focal pulmonary opacity. No pulmonary edema. No pleural effusion. No pneumothorax. HEART AND MEDIASTINUM: No acute abnormality of the cardiac  and mediastinal silhouettes. BONES AND SOFT TISSUES: No acute osseous abnormality. IMPRESSION: 1. No acute process. Electronically signed by: Donnice Mania MD 01/30/2024 08:57 PM EDT RP Workstation: [id number]   DG Chest Port 1 View Result Date: 01/28/2024 CLINICAL DATA:  Shortness of breath EXAM: PORTABLE CHEST 1 VIEW COMPARISON:  01/27/2024 FINDINGS: The heart size and mediastinal contours are within normal limits. Both lungs are clear. The visualized skeletal structures are unremarkable. No pneumothorax. IMPRESSION: No active disease. Electronically Signed   By: Franky Crease M.D.   On: 01/28/2024 20:35   DG Chest 2 View Result Date: 01/27/2024 CLINICAL DATA:  Shortness of breath EXAM: CHEST - 2 VIEW COMPARISON:  01/27/2024 FINDINGS: Heart and mediastinal contours are within normal limits. No focal opacities or effusions. No acute bony abnormality. No pneumothorax. IMPRESSION: No active cardiopulmonary disease. Electronically Signed   By: Franky Crease M.D.   On: 01/27/2024 20:50   DG Chest Port 1 View Result Date: 01/27/2024 CLINICAL DATA:  Shortness of breath EXAM: PORTABLE CHEST 1 VIEW COMPARISON:  01/21/2024 FINDINGS: Cardiac shadow is within normal limits. Lungs are well aerated bilaterally. No focal infiltrate or effusion is seen. No bony abnormality is noted. IMPRESSION: No active disease. Electronically Signed   By: Oneil Devonshire M.D.   On: 01/27/2024 03:54   DG Chest Port 1 View Result Date: 01/21/2024 CLINICAL DATA:  COPD. EXAM: PORTABLE CHEST 1 VIEW COMPARISON:  12/25/2023 FINDINGS: Lungs are hyperexpanded. The lungs are clear without focal pneumonia, edema, pneumothorax or pleural effusion. The cardiopericardial silhouette is within normal limits for size. Telemetry leads overlie the chest. IMPRESSION: Hyperexpansion without acute cardiopulmonary findings. Electronically Signed   By: Camellia Candle M.D.   On: 01/21/2024 09:28      Consults: Case discussed with internal medicine teaching  service.   Final Assessment and Plan:   After initiation of medical therapies, patient is grossly improved and no longer in acute distress.    Given the advanced nature of the patient's presentation, I recommended the patient be admitted for COPD exacerbation and ongoing therapy. Between the time of this conversation and internal medicine team evaluating patient, he had received another DuoNeb therapy.  He states he feels completely resolved and would like to be discharged by the time of their evaluation.  They have recommended that his steroid taper be extended, azithromycin  be administered in the outpatient setting and his albuterol  be refilled. Patient feels comfortable discharge at this time given clinical improvement.  He was recommended to return with any acute changes.  Ambulated with internal medicine team and saturations remained above 98% on his home 2 L.  Clinical Impression:  1. COPD exacerbation [facility])      Discharge   Final Clinical Impression(s) / ED Diagnoses Final diagnoses:  COPD exacerbation (HCC)    Rx / DC Orders ED Discharge Orders          Ordered    albuterol  (VENTOLIN  HFA) 108 (90 Base) MCG/ACT  inhaler  Every 6 hours PRN        02/15/24 0443    predniSONE  (DELTASONE ) 10 MG tablet  Q breakfast        02/15/24 0443    azithromycin  (ZITHROMAX ) 250 MG tablet  Daily        02/15/24 0443              Jerral Meth, MD 02/15/24 [id number]

## 2024-02-15 NOTE — ED Notes (Signed)
 Pt ambulated on [facility] per admitting's verbal order, no distress noted. Sats remained >99%.

## 2024-02-15 NOTE — Progress Notes (Signed)
 Francisco [name] is a 63 y.o. 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  Patient arrived via EMS and was very diaphoretic and dyspneic. He was not able to catch his breath. Patient was saturating in the 60s and was given continuous albuterol  and solumedrol. Patient was given these treatments for about an hour. Patient does use 2L of O2 at  baseline. On our evaluation, patient was in no acute distress. On auscultation patient had decreased bibasilar lung sounds, with no wheezing heard. Patient was saturating at 100% on 2L which he states is his home oxygen  requirement. Patient states that at baseline when ambulating his oxygen  saturations drop. Patient was walked in the ED and oxygen  saturations were 99% on 2L( home O2). Patient states that he does not want to be admitted and feels a lot better. Patient states he is on 20mg  of prednisone  right now for his taper. He states he is using Airsupra inhaler, albuterol , trellegy and dunoebs at home.Patient is also getting dupixent  and is due for next treatment next Monday. Patient has follow up with pulmonology on 11/13. Recommended extending therapy steroid taper as an outpatient to prednisone  40 mg for a week and prescribing azithromycin  for 3 days for anti-inflammatory properties. We felt comfortable with patient declining admission due to him saturating well on home oxygen .  Instructed EDP to send patient's medications and refills to [facility] community pharmacy on [address]. Return precautions given.

## 2024-02-15 NOTE — ED Triage Notes (Addendum)
 Pt brought to ED by [facility] from home for increase SOB, getting neb treatment at home and by EMS with no relief. Pt having some relief with last duo neb given by EMS prior to arrival. 125 mg Solumedrol given by EMS pta.  Hx of COPD. VS. 160/90, HR 70, 24 RR, 92% RA.

## 2024-02-15 NOTE — Hospital Course (Addendum)
 35 male  COPD patient  2L at night   Severe dyspnea  Continue to smoke  3 days productive sputum  Coulf not catch his breath  Very diaphoretic   Sattingin the 60s Continue albuterol   Got soumedrol  Continous albuterol  45 mins to hours  Space out to every 4 hour   Medications: Albuterol  inhaler 1-2 puffs every 4 hours as needed Albuterol -budesonide  2 puffs every 4 hours as needed Eliquis  5 mg twice daily Atorvastatin  40 mg daily Tessalon  Perles 200 mg 3 times daily as needed for cough Wellbutrin 150 mg twice daily Diltiazem  120 mg daily Dipillo Mab 300 mg every 14 days Flonase  1 spray twice daily Trelegy Ellipta  1 puff daily Robitussin Atarax  25 mg 3 times daily as needed DuoNeb nebulizers Claritin  10 mg daily Losartan  50 mg daily Metoprolol  tartrate 25 mg twice daily Nitroglycerin  0.4 mg daily 2 L nasal cannula oxygen  nightly Prednisone  taper (should currently be on the 20 mg dosage for 7 days) Thiamine  100 mg daily  Lives with: Support: Level of function: 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  With evidence of air trapping, severely restricted DLCO.  Appropriately on Trelegy given frequent exacerbations.  Eosinophil count of 300 2024.  Started Dupixent  11/2023 with some marginal improvement.   Unfortunate, no significant improvement in symptoms or exacerbations now with 3 months of use.  I have seen patients take up to 6 months to show significant improvement with Biologics.  He agreed to continue Dupixent  for a total of 6 months.  If no better we will discontinue at that time.  Only other alternative would be daily prednisone  which he would like to avoid.  Airsupra, DuoNebs to replace albuterol  for rescue medications, new prescription sent today.  Prednisone  taper sent today.  Recent chest x-ray 01/21/2024 clear, wheeze on  exam, no antibiotics sent based on these findings.   Dyspnea on exertion: Likely multifactorial due to significant and severe COPD as well as cardiac etiologies.  Will tend to be as aggressive as possible from a pulmonary standpoint to see if we can improve this.  Unfortunately despite increasing medications, not a lot of improvement.  Encouraged ongoing cardiology follow-up.    Return in about 3 months (around 04/24/2024) for f/u Dr. Annella.

## 2024-02-15 NOTE — ED Notes (Signed)
 Patient verbalizes understanding of discharge instructions. Opportunity for questioning and answers were provided. Armband removed by staff, pt discharged from ED. Wheeled out to lobby, awaiting cab for transport home

## 2024-02-17 ENCOUNTER — Other Ambulatory Visit: Payer: Self-pay

## 2024-02-20 ENCOUNTER — Other Ambulatory Visit (HOSPITAL_COMMUNITY): Payer: Self-pay

## 2024-02-23 ENCOUNTER — Other Ambulatory Visit

## 2024-02-23 NOTE — Patient Instructions (Signed)
 Visit Information  Thank you for taking time to visit with me today. Please don't hesitate to contact me if I can be of assistance to you before our next scheduled telephone appointment.  Our next appointment is by telephone on Friday November 14th at 2:00pm  Following is a copy of your care plan:   Goals Addressed             This Visit's Progress    VBCI Transitions of Care (TOC) Care Plan       Problems: (reviewed 02/23/24) Recent Hospitalization for treatment of COPD Home Health services barrier: The patient could benefit from HHPT and RN  Goal: (reviewed 02/23/24) Over the next 30 days, the patient will not experience hospital readmission  Interventions:   COPD Interventions:(reviewed 02/23/24) Advised patient to engage in light exercise as tolerated 3-5 days a week to aid in the the management of COPD Advised patient to track and manage COPD triggers Assessed social determinant of health barriers Discussed the importance of adequate rest and management of fatigue with COPD Provided education about and advised patient to utilize infection prevention strategies to reduce risk of respiratory infection Provided instruction about proper use of medications used for management of COPD including inhalers Screening for signs and symptoms of depression related to chronic disease state  Use of home oxygen  Oxygen  continuous at 2l/min - 10/24 Do not smoke while wearing oxygen   Check Pulse oximeter and maintain O2 sats above 90% Smoking cessation as able - the patient states he is only smoking a half a pack a day - 10/24 The patient states he is smoking 2-3 cigarettes daily. He continues with IV Meth every other day F/U provider visit 02/15/24 - Completed 02/23/24 - The patient states he has cut down on his Methamphetamine use to every four days Pulmonology appointment 11/13  Patient Self Care Activities: (reviewed 02/23/24) Attend all scheduled provider appointments Call pharmacy for  medication refills 3-7 days in advance of running out of medications Call provider office for new concerns or questions  Notify RN Care Manager of [facility] call rescheduling needs Participate in Transition of Care Program/Attend [facility] scheduled calls Take medications as prescribed   identify and remove indoor air pollutants limit outdoor activity during cold weather develop a rescue plan keep follow-up appointments: Dr. Annella, Pulmonology practice relaxation or meditation daily do breathing exercises every day  Plan:  Telephone follow up appointment with care management team member scheduled for:  Friday November 14th at 2:00pm        Patient verbalizes understanding of instructions and care plan provided today and agrees to view in MyChart. Active MyChart status and patient understanding of how to access instructions and care plan via MyChart confirmed with patient.     The patient has been provided with contact information for the care management team and has been advised to call with any health related questions or concerns.   Please call the care guide team at [phone] if you need to cancel or reschedule your appointment.   Please call the Suicide and Crisis Lifeline: 988 call the [place]  National Suicide Prevention Lifeline: (604)504-7790 or TTY: 216-142-7211 TTY 713 415 1971) to talk to a trained counselor if you are experiencing a Mental Health or Behavioral Health Crisis or need someone to talk to.  Medford Balboa, BSN, RN [place]  VBCI - [employer] [facility] RN Care Manager [phone]

## 2024-02-23 NOTE — Transitions of Care (Post Inpatient/ED Visit) (Signed)
 Transition of Care week 3  Visit Note  02/23/2024  Name: Francisco [name] MRN: [id number]          DOB: [birth year]  Situation: Patient enrolled in [facility] 30-day program. Visit completed with Lemond Daring by telephone.   Background:   Initial Transition Care Management Follow-up Telephone Call Discharge Date and Diagnosis: 02/01/24, COPD Exacerbation   Past Medical History:  Diagnosis Date   Atrial flutter with rapid ventricular response (HCC) 08/14/2023   CHF (congestive heart failure) (HCC)    COPD (chronic obstructive pulmonary disease) (HCC)    COPD with acute exacerbation (HCC) 10/21/2021   Coronary artery disease    a. s/p prior PCI.   Hepatitis-C    History of CHF (congestive heart failure) 01/28/2024   History of stroke 02/13/2023   Hypertension    IV drug abuse (HCC)    a. 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   Medically noncompliant    MI (myocardial infarction) (HCC)    TIA (transient ischemic attack) 02/14/2023   Tobacco abuse     Assessment: Patient Reported Symptoms: Cognitive Cognitive Status: Alert and oriented to person, place, and time, Normal speech and language skills      Neurological Neurological Review of Symptoms: No symptoms reported    HEENT HEENT Symptoms Reported: No symptoms reported      Cardiovascular Cardiovascular Symptoms Reported: No symptoms reported Does patient have uncontrolled Hypertension?: No Cardiovascular Management Strategies: Medication therapy, Routine screening, Coping strategies Cardiovascular Comment: The patient continues with Prednisone . He is using meth now every four days  Respiratory Respiratory Symptoms Reported: Shortness of breath Additional Respiratory Details: The patient reports difficulty with breathing with everything he does. He feels like he is breathing through a straw. He went to the ED on 02/15/24 and received treatment and states he felt good for about a day and now he is back to not feeling well.  Wearing oxygen  2l/min continuously. His saturations drop down to 76% when he takes it off. He states he takes his inhalers and medications. Discussed energy conservation techniques Respiratory Management Strategies: Medication therapy, Oxygen  therapy, Activity  Endocrine Endocrine Symptoms Reported: No symptoms reported Is patient diabetic?: No    Gastrointestinal Gastrointestinal Symptoms Reported: No symptoms reported      Genitourinary Genitourinary Symptoms Reported: No symptoms reported    Integumentary Integumentary Symptoms Reported: No symptoms reported    Musculoskeletal Musculoskelatal Symptoms Reviewed: Back pain Musculoskeletal Management Strategies: Routine screening Musculoskeletal Comment: Thepatient is feeling weak because he cannot breathe      Psychosocial Psychosocial Symptoms Reported: Not assessed         There were no vitals filed for this visit.  Medications Reviewed Today     Reviewed by Moises Reusing, RN (Case Manager) on 02/23/24 at 1552  Med List Status: <None>   Medication Order Taking? Sig Documenting Provider Last Dose Status Informant  albuterol  (VENTOLIN  HFA) 108 (90 Base) MCG/ACT inhaler [id number]  Inhale 1-2 puffs into the lungs every 6 (six) hours as needed for wheezing or shortness of breath. Jerral Meth, MD  Active   Albuterol -Budesonide  90-80 MCG/ACT TERESE [id number]  Inhale 2 puffs into the lungs every 4 (four) hours as needed (wheeze, shortness of breath). Hunsucker, [name], MD  Active Self, Pharmacy Records  apixaban  (ELIQUIS ) 5 MG TABS tablet [id number]  Take 1 tablet (5 mg total) by mouth 2 (two) times daily. Marylu Gee, DO  Active   atorvastatin  (LIPITOR ) 40 MG tablet [id number]  Take 1 tablet (40 mg total) by  mouth daily. Marylu Gee, DO  Active   benzonatate  (TESSALON ) 200 MG capsule [id number]  Take 1 capsule (200 mg total) by mouth 3 (three) times daily as needed for cough. Amilibia, Jaden, DO  Active   buPROPion  (WELLBUTRIN SR) 150 MG 12 hr tablet [id number]  Take 1 tablet (150 mg total) by mouth 2 (two) times daily. Take 1 tablet once daily for the first 3 days (on 10/15-10/17), then take one tablet twice daily (10/18 and onward) Amilibia, Jaden, DO  Active   diltiazem  (CARDIZEM  CD) 120 MG 24 hr capsule [id number]  Take 1 capsule (120 mg total) by mouth daily. Marylu Gee, DO  Active   Dupilumab  (DUPIXENT ) 300 MG/2ML EMMANUEL [id number] No Inject 300 mg into the skin every 14 (fourteen) days. Hunsucker, [name], MD Past Week Active Self, Pharmacy Records  fluticasone  (FLONASE ) 50 MCG/ACT nasal spray [id number] No Place 1 spray into both nostrils daily. [name], [name], DO Past Week Active Self, Pharmacy Records  Fluticasone -Umeclidin-Vilant (TRELEGY ELLIPTA ) 100-62.5-25 MCG/ACT AEPB [id number] No Inhale 1 puff into the lungs daily. Marylu Gee, DO 12/24/2023 Morning Active Self, Pharmacy Records  guaiFENesin -dextromethorphan  (ROBITUSSIN DM) 100-10 MG/5ML syrup [id number]  Take 15 mLs by mouth 4 (four) times daily. Amilibia, Jaden, DO  Active   ipratropium-albuterol  (DUONEB) 0.5-2.5 (3) MG/3ML SOLN [id number] No Take 3 mLs by nebulization every 6 (six) hours as needed. Hunsucker, [name], MD Past Week Active Self, Pharmacy Records  loratadine  (CLARITIN ) 10 MG tablet [id number] No Take 1 tablet (10 mg total) by mouth daily. Heddy Barren, DO Past Week Active Self, Pharmacy Records  losartan  (COZAAR ) 50 MG tablet [id number]  Take 1 tablet (50 mg total) by mouth daily. Marylu Gee, DO  Active   metoprolol  tartrate (LOPRESSOR ) 25 MG tablet [id number]  Take 1 tablet (25 mg total) by mouth 2 (two) times daily. Marylu Gee, DO  Active   nitroGLYCERIN  (NITROSTAT ) 0.4 MG SL tablet [id number] No Place 1 tablet (0.4 mg total) under the tongue every 5 (five) minutes x 3 doses as needed for chest pain. Heddy Barren, DO Unknown Active Self, Pharmacy Records           Med Note [facility], DONETA GORMAN Repress Jan 29, 2024  5:06 AM)  Not sure of the last time medication was taken  OXYGEN  [id number]  Inhale 2 L/min into the lungs continuous. [provider]  Active   predniSONE  (DELTASONE ) 10 MG tablet [id number]  Take 4 tablets by mouth daily with breakfast for 2 days, THEN 3 tablets daily with breakfast for 7 days, THEN 2 tablets daily with breakfast for 7 days, THEN 1 tablet daily with breakfast for 7 days, THEN 1/2 tablet daily with breakfast for 7 days. Jerral Meth, MD  Active   thiamine  (VITAMIN B-1) 100 MG tablet [id number] No Take 1 tablet (100 mg total) by mouth daily. Verdene Purchase, MD Past Week Active Self, Pharmacy Records            Recommendation:   Continue Current Plan of Care  Follow Up Plan:   Telephone follow-up in 1 week  Medford Balboa, BSN, RN [place]  VBCI - [facility] RN Care Manager [phone]

## 2024-02-26 ENCOUNTER — Emergency Department (HOSPITAL_COMMUNITY)

## 2024-02-26 ENCOUNTER — Emergency Department (HOSPITAL_COMMUNITY)
Admission: EM | Admit: 2024-02-26 | Discharge: 2024-02-26 | Disposition: A | Discharge status: Home / Self Care | Attending: Emergency Medicine | Admitting: Emergency Medicine

## 2024-02-26 ENCOUNTER — Encounter (HOSPITAL_COMMUNITY): Payer: Self-pay

## 2024-02-26 ENCOUNTER — Other Ambulatory Visit: Payer: Self-pay

## 2024-02-26 DIAGNOSIS — R0602 Shortness of breath: Secondary | ICD-10-CM | POA: Diagnosis not present

## 2024-02-26 DIAGNOSIS — R0603 Acute respiratory distress: Secondary | ICD-10-CM | POA: Diagnosis not present

## 2024-02-26 DIAGNOSIS — J441 Chronic obstructive pulmonary disease with (acute) exacerbation: Secondary | ICD-10-CM | POA: Insufficient documentation

## 2024-02-26 DIAGNOSIS — Z7901 Long term (current) use of anticoagulants: Secondary | ICD-10-CM | POA: Insufficient documentation

## 2024-02-26 DIAGNOSIS — R000 Tachycardia, unspecified: Secondary | ICD-10-CM | POA: Diagnosis not present

## 2024-02-26 DIAGNOSIS — R062 Wheezing: Secondary | ICD-10-CM | POA: Diagnosis not present

## 2024-02-26 LAB — CBC WITH DIFFERENTIAL/PLATELET
Abs Immature Granulocytes: 0.11 K/uL — ABNORMAL HIGH (ref 0.00–0.07)
Basophils Absolute: 0.1 K/uL (ref 0.0–0.1)
Basophils Relative: 1 %
Eosinophils Absolute: 0.1 K/uL (ref 0.0–0.5)
Eosinophils Relative: 1 %
HCT: 45.7 % (ref 39.0–52.0)
Hemoglobin: 14.6 g/dL (ref 13.0–17.0)
Immature Granulocytes: 1 %
Lymphocytes Relative: 31 %
Lymphs Abs: 3.2 K/uL (ref 0.7–4.0)
MCH: 31.1 pg (ref 26.0–34.0)
MCHC: 31.9 g/dL (ref 30.0–36.0)
MCV: 97.4 fL (ref 80.0–100.0)
Monocytes Absolute: 1.1 K/uL — ABNORMAL HIGH (ref 0.1–1.0)
Monocytes Relative: 10 %
Neutro Abs: 5.9 K/uL (ref 1.7–7.7)
Neutrophils Relative %: 56 %
Platelets: 186 K/uL (ref 150–400)
RBC: 4.69 MIL/uL (ref 4.22–5.81)
RDW: 13.4 % (ref 11.5–15.5)
WBC: 10.4 K/uL (ref 4.0–10.5)
nRBC: 0 % (ref 0.0–0.2)

## 2024-02-26 LAB — COMPREHENSIVE METABOLIC PANEL WITH GFR
ALT: 46 U/L — ABNORMAL HIGH (ref 0–44)
AST: 29 U/L (ref 15–41)
Albumin: 3.5 g/dL (ref 3.5–5.0)
Alkaline Phosphatase: 61 U/L (ref 38–126)
Anion gap: 9 (ref 5–15)
BUN: 15 mg/dL (ref 8–23)
CO2: 30 mmol/L (ref 22–32)
Calcium: 8.9 mg/dL (ref 8.9–10.3)
Chloride: 102 mmol/L (ref 98–111)
Creatinine, Ser: 1.14 mg/dL (ref 0.61–1.24)
GFR, Estimated: >60 mL/min (ref >60)
Glucose, Bld: 161 mg/dL — ABNORMAL HIGH (ref 70–99)
Potassium: 4.7 mmol/L (ref 3.5–5.1)
Sodium: 141 mmol/L (ref 135–145)
Total Bilirubin: 1.2 mg/dL (ref 0.0–1.2)
Total Protein: 6.3 g/dL — ABNORMAL LOW (ref 6.5–8.1)

## 2024-02-26 LAB — I-STAT CHEM 8, ED
BUN: 20 mg/dL (ref 8–23)
Calcium, Ion: 1.17 mmol/L (ref 1.15–1.40)
Chloride: 101 mmol/L (ref 98–111)
Creatinine, Ser: 1.2 mg/dL (ref 0.61–1.24)
Glucose, Bld: 121 mg/dL — ABNORMAL HIGH (ref 70–99)
HCT: 44 % (ref 39.0–52.0)
Hemoglobin: 15 g/dL (ref 13.0–17.0)
Potassium: 3.6 mmol/L (ref 3.5–5.1)
Sodium: 142 mmol/L (ref 135–145)
TCO2: 32 mmol/L (ref 22–32)

## 2024-02-26 LAB — TROPONIN I (HIGH SENSITIVITY)
Troponin I (High Sensitivity): 20 ng/L — ABNORMAL HIGH (ref <18)
Troponin I (High Sensitivity): 17 ng/L (ref <18)

## 2024-02-26 LAB — I-STAT VENOUS BLOOD GAS, ED
Acid-Base Excess: 8 mmol/L — ABNORMAL HIGH (ref 0.0–2.0)
Bicarbonate: 34.6 mmol/L — ABNORMAL HIGH (ref 20.0–28.0)
Calcium, Ion: 1.17 mmol/L (ref 1.15–1.40)
HCT: 44 % (ref 39.0–52.0)
Hemoglobin: 15 g/dL (ref 13.0–17.0)
O2 Saturation: 91 %
Potassium: 3.7 mmol/L (ref 3.5–5.1)
Sodium: 142 mmol/L (ref 135–145)
TCO2: 36 mmol/L — ABNORMAL HIGH (ref 22–32)
pCO2, Ven: 56.2 mmHg (ref 44–60)
pH, Ven: 7.397 (ref 7.25–7.43)
pO2, Ven: 62 mmHg — ABNORMAL HIGH (ref 32–45)

## 2024-02-26 LAB — I-STAT CG4 LACTIC ACID, ED: Lactic Acid, Venous: 1.4 mmol/L (ref 0.5–1.9)

## 2024-02-26 LAB — BRAIN NATRIURETIC PEPTIDE: B Natriuretic Peptide: 58.9 pg/mL (ref 0.0–100.0)

## 2024-02-26 MED ORDER — LORAZEPAM 2 MG/ML IJ SOLN
0.5000 mg | Freq: Once | INTRAMUSCULAR | Status: AC
  Administered 2024-02-26: 0.5 mg via INTRAVENOUS
  Filled 2024-02-26: qty 1

## 2024-02-26 MED ORDER — IPRATROPIUM-ALBUTEROL 0.5-2.5 (3) MG/3ML IN SOLN
3.0000 mL | Freq: Once | RESPIRATORY_TRACT | Status: AC
  Administered 2024-02-26: 3 mL via RESPIRATORY_TRACT
  Filled 2024-02-26: qty 3

## 2024-02-26 MED ORDER — NITROGLYCERIN 2 % TD OINT
1.0000 [in_us] | TOPICAL_OINTMENT | Freq: Once | TRANSDERMAL | Status: AC
  Administered 2024-02-26: 1 [in_us] via TOPICAL
  Filled 2024-02-26: qty 1

## 2024-02-26 MED ORDER — LOSARTAN POTASSIUM 50 MG PO TABS
50.0000 mg | ORAL_TABLET | Freq: Once | ORAL | Status: AC
  Administered 2024-02-26: 50 mg via ORAL
  Filled 2024-02-26: qty 1

## 2024-02-26 NOTE — ED Triage Notes (Signed)
 Pt bib ems from home c.o resp distress, hx of COPD. Felt SOB yesterday, worse this morning. Wears 2L [place] at home all the time.  Pt given  125 solumedrol 2G meg Multiple breathing treatments. Pt a.o

## 2024-02-26 NOTE — Discharge Instructions (Addendum)
 Return for any problem.  ?

## 2024-02-26 NOTE — ED Provider Notes (Signed)
 [facility] EMERGENCY DEPARTMENT AT [facility] Provider Note   CSN: [id number] Arrival date & time: 02/26/24  9253     Patient presents with: Respiratory Distress   Francisco [name] is a 63 y.o. male.   63 year old male with prior medical history as detailed below presents for evaluation.  Patient was at home in his normal state of health.  He developed shortness of breath gradually over the course of yesterday.  Symptoms were much worse this morning.  EMS reports that he was in significant distress on their initial evaluation.  Minimal breath sounds were heard by EMS.  Patient was given 125 mg of Solu-Medrol , 2 g of magnesium , multiple breathing treatments during transport.  On arrival the patient feels much improved.  He denies fever.  He denies pain.  He wears 2 L nasal cannula as needed at home.  His blood pressure is significantly elevated.  He admits that he did not take his blood pressure medications this morning.  He is compliant with Eliquis  as previously prescribed.  The history is provided by the patient and medical records.       Prior to Admission medications   Medication Sig Start Date End Date Taking? Authorizing Provider  albuterol  (VENTOLIN  HFA) 108 (90 Base) MCG/ACT inhaler Inhale 1-2 puffs into the lungs every 6 (six) hours as needed for wheezing or shortness of breath. 02/15/24   Jerral Meth, MD  Albuterol -Budesonide  90-80 MCG/ACT AERO Inhale 2 puffs into the lungs every 4 (four) hours as needed (wheeze, shortness of breath). 01/23/24   Hunsucker, [name], MD  apixaban  (ELIQUIS ) 5 MG TABS tablet Take 1 tablet (5 mg total) by mouth 2 (two) times daily. 02/01/24   Marylu Gee, DO  atorvastatin  (LIPITOR ) 40 MG tablet Take 1 tablet (40 mg total) by mouth daily. 02/01/24   Marylu Gee, DO  benzonatate  (TESSALON ) 200 MG capsule Take 1 capsule (200 mg total) by mouth 3 (three) times daily as needed for cough. 02/01/24   Amilibia, Jaden, DO  buPROPion  (WELLBUTRIN SR) 150 MG 12 hr tablet Take 1 tablet (150 mg total) by mouth 2 (two) times daily. Take 1 tablet once daily for the first 3 days (on 10/15-10/17), then take one tablet twice daily (10/18 and onward) 02/01/24   Amilibia, Jaden, DO  diltiazem  (CARDIZEM  CD) 120 MG 24 hr capsule Take 1 capsule (120 mg total) by mouth daily. 02/01/24   Marylu Gee, DO  Dupilumab  (DUPIXENT ) 300 MG/2ML SOAJ Inject 300 mg into the skin every 14 (fourteen) days. 01/26/24   Hunsucker, [name], MD  fluticasone  (FLONASE ) 50 MCG/ACT nasal spray Place 1 spray into both nostrils daily. 01/25/24   [name], [name], DO  Fluticasone -Umeclidin-Vilant (TRELEGY ELLIPTA ) 100-62.5-25 MCG/ACT AEPB Inhale 1 puff into the lungs daily. 11/21/23 03/05/24  Marylu Gee, DO  guaiFENesin -dextromethorphan  (ROBITUSSIN DM) 100-10 MG/5ML syrup Take 15 mLs by mouth 4 (four) times daily. 02/01/24   Amilibia, Jaden, DO  ipratropium-albuterol  (DUONEB) 0.5-2.5 (3) MG/3ML SOLN Take 3 mLs by nebulization every 6 (six) hours as needed. 01/23/24   Hunsucker, [name], MD  loratadine  (CLARITIN ) 10 MG tablet Take 1 tablet (10 mg total) by mouth daily. 11/28/23 11/27/24  [name], [name], DO  losartan  (COZAAR ) 50 MG tablet Take 1 tablet (50 mg total) by mouth daily. 02/01/24   Marylu Gee, DO  metoprolol  tartrate (LOPRESSOR ) 25 MG tablet Take 1 tablet (25 mg total) by mouth 2 (two) times daily. 02/01/24   Marylu Gee, DO  nitroGLYCERIN  (NITROSTAT ) 0.4 MG  SL tablet Place 1 tablet (0.4 mg total) under the tongue every 5 (five) minutes x 3 doses as needed for chest pain. 11/17/23   [name], [name], DO  OXYGEN  Inhale 2 L/min into the lungs continuous.    [provider]  predniSONE  (DELTASONE ) 10 MG tablet Take 4 tablets by mouth daily with breakfast for 2 days, THEN 3 tablets daily with breakfast for 7 days, THEN 2 tablets daily with breakfast for 7 days, THEN 1 tablet daily with breakfast for 7 days, THEN 1/2 tablet daily with breakfast for 7  days. 02/15/24 03/16/24  Jerral Meth, MD  thiamine  (VITAMIN B-1) 100 MG tablet Take 1 tablet (100 mg total) by mouth daily. 08/15/23   Krishnan, Gokul, MD    Allergies: Ivp dye [iodinated contrast media], Tramadol, and Doxycycline     Review of Systems  All other systems reviewed and are negative.   Updated Vital Signs BP (!) 244/118 (BP Location: Right Arm)   Pulse (!) 117   Temp 97.8 F (36.6 C) (Oral)   Resp (!) 30   Ht 6' (1.829 m)   Wt 96 kg   SpO2 100%   BMI 28.70 kg/m   Physical Exam Vitals and nursing note reviewed.  Constitutional:      General: He is not in acute distress.    Appearance: Normal appearance. He is well-developed.  HENT:     Head: Normocephalic and atraumatic.  Eyes:     Conjunctiva/sclera: Conjunctivae normal.     Pupils: Pupils are equal, round, and reactive to light.  Cardiovascular:     Rate and Rhythm: Regular rhythm. Tachycardia present.     Heart sounds: Normal heart sounds.  Pulmonary:     Effort: Pulmonary effort is normal. No respiratory distress.     Comments: Trace scattered expiratory wheezes in all lung fields Abdominal:     General: There is no distension.     Palpations: Abdomen is soft.     Tenderness: There is no abdominal tenderness.  Musculoskeletal:        General: No deformity. Normal range of motion.     Cervical back: Normal range of motion and neck supple.  Skin:    General: Skin is warm and dry.  Neurological:     General: No focal deficit present.     Mental Status: He is alert and oriented to person, place, and time.     (all labs ordered are listed, but only abnormal results are displayed) Labs Reviewed  CBC WITH DIFFERENTIAL/PLATELET - Abnormal; Notable for the following components:      Result Value   Monocytes Absolute 1.1 (*)    Abs Immature Granulocytes 0.11 (*)    All other components within normal limits  I-STAT CHEM 8, ED - Abnormal; Notable for the following components:   Glucose, Bld 121  (*)    All other components within normal limits  I-STAT VENOUS BLOOD GAS, ED - Abnormal; Notable for the following components:   pO2, Ven 62 (*)    Bicarbonate 34.6 (*)    TCO2 36 (*)    Acid-Base Excess 8.0 (*)    All other components within normal limits  CULTURE, BLOOD (ROUTINE X 2)  CULTURE, BLOOD (ROUTINE X 2)  COMPREHENSIVE METABOLIC PANEL WITH GFR  BRAIN NATRIURETIC PEPTIDE  I-STAT CG4 LACTIC ACID, ED  TROPONIN I (HIGH SENSITIVITY)    EKG: EKG Interpretation Date/Time:  Sunday February 26 2024 07:54:10 EST Ventricular Rate:  117 PR Interval:    QRS  Duration:  114 QT Interval:  312 QTC Calculation: 436 R Axis:   51  Text Interpretation: Atrial fibrillation Borderline intraventricular conduction delay Nonspecific repol abnormality, inferior leads Artifact in lead(s) I II aVR aVL V5 V6 Confirmed by Laurice Coy [phone]) on 02/26/2024 7:55:55 AM  Radiology: [facility] Chest Port 1 View Result Date: 02/26/2024 CLINICAL DATA:  Shortness of breath, respiratory distress EXAM: PORTABLE CHEST 1 VIEW COMPARISON:  02/15/2024 FINDINGS: The heart size and mediastinal contours are within normal limits. Mild hyperinflation noted. Both lungs are clear. The visualized skeletal structures are unremarkable. IMPRESSION: Mild hyperinflation. No acute process. Electronically Signed   By: CHRISTELLA.  Shick M.D.   On: 02/26/2024 08:16     Procedures   Medications Ordered in the ED  losartan  (COZAAR ) tablet 50 mg (has no administration in time range)  LORazepam  (ATIVAN ) injection 0.5 mg (0.5 mg Intravenous Given 02/26/24 0804)  nitroGLYCERIN  (NITROGLYN) 2 % ointment 1 inch (1 inch Topical Given 02/26/24 0806)                                    Medical Decision Making Patient with known history of COPD, on baseline 2 L nasal cannula.  He reports increased shortness of breath today.  EMS treated the patient during transport with 125 mg of Solu-Medrol , 2 g of magnesium , multiple breathing treatments.  On  evaluation the patient feels much improved.  Screening imaging and labs are without evidence of other significant pathology.  Patient was encouraged to allow admission and/or stay for lengthy observation.  He declines.  He wants to go home.  Just prior to discharge he is able to ambulate on his normal 2 L nasal cannula around the department without apparent difficulty.  He is adamant in his desire to leave.  Importance of close follow-up stressed.  Strict return precautions given understood.  Amount and/or Complexity of Data Reviewed Labs: ordered. Radiology: ordered.  Risk Prescription drug management.   CRITICAL CARE Performed by: Coy JAYSON Laurice   Total critical care time: 30 minutes  Critical care time was exclusive of separately billable procedures and treating other patients.  Critical care was necessary to treat or prevent imminent or life-threatening deterioration.  Critical care was time spent personally by me on the following activities: development of treatment plan with patient and/or surrogate as well as nursing, discussions with consultants, evaluation of patient's response to treatment, examination of patient, obtaining history from patient or surrogate, ordering and performing treatments and interventions, ordering and review of laboratory studies, ordering and review of radiographic studies, pulse oximetry and re-evaluation of patient's condition.      Final diagnoses:  COPD exacerbation [facility])    ED Discharge Orders     None          Laurice Coy JAYSON, MD 02/26/24 1157

## 2024-02-26 NOTE — ED Notes (Signed)
 Ambulated self to the restroom

## 2024-02-27 ENCOUNTER — Observation Stay (HOSPITAL_COMMUNITY)
Admission: EM | Admit: 2024-02-27 | Discharge: 2024-02-28 | Disposition: A | Discharge status: Home / Self Care | Attending: Internal Medicine | Admitting: Internal Medicine

## 2024-02-27 ENCOUNTER — Emergency Department (HOSPITAL_COMMUNITY)

## 2024-02-27 ENCOUNTER — Encounter (HOSPITAL_COMMUNITY): Payer: Self-pay

## 2024-02-27 DIAGNOSIS — R000 Tachycardia, unspecified: Secondary | ICD-10-CM | POA: Diagnosis not present

## 2024-02-27 DIAGNOSIS — B1920 Unspecified viral hepatitis C without hepatic coma: Secondary | ICD-10-CM | POA: Diagnosis present

## 2024-02-27 DIAGNOSIS — I420 Dilated cardiomyopathy: Secondary | ICD-10-CM | POA: Diagnosis present

## 2024-02-27 DIAGNOSIS — F1510 Other stimulant abuse, uncomplicated: Secondary | ICD-10-CM | POA: Insufficient documentation

## 2024-02-27 DIAGNOSIS — J9621 Acute and chronic respiratory failure with hypoxia: Secondary | ICD-10-CM | POA: Diagnosis not present

## 2024-02-27 DIAGNOSIS — Z743 Need for continuous supervision: Secondary | ICD-10-CM | POA: Diagnosis not present

## 2024-02-27 DIAGNOSIS — Z9861 Coronary angioplasty status: Secondary | ICD-10-CM | POA: Insufficient documentation

## 2024-02-27 DIAGNOSIS — I2510 Atherosclerotic heart disease of native coronary artery without angina pectoris: Secondary | ICD-10-CM | POA: Insufficient documentation

## 2024-02-27 DIAGNOSIS — I10 Essential (primary) hypertension: Secondary | ICD-10-CM | POA: Diagnosis not present

## 2024-02-27 DIAGNOSIS — J9601 Acute respiratory failure with hypoxia: Secondary | ICD-10-CM

## 2024-02-27 DIAGNOSIS — R0602 Shortness of breath: Secondary | ICD-10-CM | POA: Diagnosis not present

## 2024-02-27 DIAGNOSIS — R231 Pallor: Secondary | ICD-10-CM | POA: Diagnosis not present

## 2024-02-27 DIAGNOSIS — R079 Chest pain, unspecified: Secondary | ICD-10-CM | POA: Diagnosis not present

## 2024-02-27 DIAGNOSIS — F1590 Other stimulant use, unspecified, uncomplicated: Secondary | ICD-10-CM | POA: Diagnosis present

## 2024-02-27 DIAGNOSIS — R069 Unspecified abnormalities of breathing: Secondary | ICD-10-CM | POA: Diagnosis not present

## 2024-02-27 DIAGNOSIS — F17210 Nicotine dependence, cigarettes, uncomplicated: Secondary | ICD-10-CM | POA: Diagnosis not present

## 2024-02-27 DIAGNOSIS — I480 Paroxysmal atrial fibrillation: Secondary | ICD-10-CM | POA: Diagnosis not present

## 2024-02-27 DIAGNOSIS — J449 Chronic obstructive pulmonary disease, unspecified: Secondary | ICD-10-CM | POA: Diagnosis present

## 2024-02-27 DIAGNOSIS — J441 Chronic obstructive pulmonary disease with (acute) exacerbation: Principal | ICD-10-CM | POA: Diagnosis present

## 2024-02-27 DIAGNOSIS — R0902 Hypoxemia: Secondary | ICD-10-CM | POA: Diagnosis not present

## 2024-02-27 LAB — I-STAT CHEM 8, ED
BUN: 17 mg/dL (ref 8–23)
Calcium, Ion: 1.19 mmol/L (ref 1.15–1.40)
Chloride: 101 mmol/L (ref 98–111)
Creatinine, Ser: 1.1 mg/dL (ref 0.61–1.24)
Glucose, Bld: 136 mg/dL — ABNORMAL HIGH (ref 70–99)
HCT: 41 % (ref 39.0–52.0)
Hemoglobin: 13.9 g/dL (ref 13.0–17.0)
Potassium: 3.8 mmol/L (ref 3.5–5.1)
Sodium: 142 mmol/L (ref 135–145)
TCO2: 33 mmol/L — ABNORMAL HIGH (ref 22–32)

## 2024-02-27 LAB — BASIC METABOLIC PANEL WITH GFR
Anion gap: 12 (ref 5–15)
BUN: 13 mg/dL (ref 8–23)
CO2: 31 mmol/L (ref 22–32)
Calcium: 9 mg/dL (ref 8.9–10.3)
Chloride: 103 mmol/L (ref 98–111)
Creatinine, Ser: 1.08 mg/dL (ref 0.61–1.24)
GFR, Estimated: >60 mL/min (ref >60)
Glucose, Bld: 133 mg/dL — ABNORMAL HIGH (ref 70–99)
Potassium: 4 mmol/L (ref 3.5–5.1)
Sodium: 146 mmol/L — ABNORMAL HIGH (ref 135–145)

## 2024-02-27 LAB — CBC WITH DIFFERENTIAL/PLATELET
Abs Immature Granulocytes: 0.07 K/uL (ref 0.00–0.07)
Basophils Absolute: 0 K/uL (ref 0.0–0.1)
Basophils Relative: 0 %
Eosinophils Absolute: 0 K/uL (ref 0.0–0.5)
Eosinophils Relative: 0 %
HCT: 42.5 % (ref 39.0–52.0)
Hemoglobin: 13.6 g/dL (ref 13.0–17.0)
Immature Granulocytes: 1 %
Lymphocytes Relative: 16 %
Lymphs Abs: 1.9 K/uL (ref 0.7–4.0)
MCH: 31.1 pg (ref 26.0–34.0)
MCHC: 32 g/dL (ref 30.0–36.0)
MCV: 97.3 fL (ref 80.0–100.0)
Monocytes Absolute: 0.9 K/uL (ref 0.1–1.0)
Monocytes Relative: 7 %
Neutro Abs: 9.1 K/uL — ABNORMAL HIGH (ref 1.7–7.7)
Neutrophils Relative %: 76 %
Platelets: 197 K/uL (ref 150–400)
RBC: 4.37 MIL/uL (ref 4.22–5.81)
RDW: 13.2 % (ref 11.5–15.5)
WBC: 12 K/uL — ABNORMAL HIGH (ref 4.0–10.5)
nRBC: 0 % (ref 0.0–0.2)

## 2024-02-27 LAB — I-STAT VENOUS BLOOD GAS, ED
Acid-Base Excess: 8 mmol/L — ABNORMAL HIGH (ref 0.0–2.0)
Bicarbonate: 34.7 mmol/L — ABNORMAL HIGH (ref 20.0–28.0)
Calcium, Ion: 1.19 mmol/L (ref 1.15–1.40)
HCT: 41 % (ref 39.0–52.0)
Hemoglobin: 13.9 g/dL (ref 13.0–17.0)
O2 Saturation: 84 %
Potassium: 3.8 mmol/L (ref 3.5–5.1)
Sodium: 143 mmol/L (ref 135–145)
TCO2: 36 mmol/L — ABNORMAL HIGH (ref 22–32)
pCO2, Ven: 58.3 mmHg (ref 44–60)
pH, Ven: 7.382 (ref 7.25–7.43)
pO2, Ven: 52 mmHg — ABNORMAL HIGH (ref 32–45)

## 2024-02-27 MED ORDER — IPRATROPIUM-ALBUTEROL 0.5-2.5 (3) MG/3ML IN SOLN
3.0000 mL | RESPIRATORY_TRACT | Status: DC | PRN
  Filled 2024-02-27: qty 3

## 2024-02-27 NOTE — ED Notes (Signed)
 Patient is resting comfortably.

## 2024-02-27 NOTE — ED Triage Notes (Signed)
 Pt bib by [facility] from home c.o. respiratory distress, unable to speak when EMS got there. Decreased and wheezing lung sounds  79% on home duoneb 6L now  3 duoneb from ems 125 solumedrol  Hx of copd and chf 20 LFA

## 2024-02-27 NOTE — Hospital Course (Signed)
 Respiratory distress Hypoxic/hypercapnic respiratory failure Chronic 3 l/min Duoneb x 3, mg, solumedrol 125 Hypertension Hypercoagulable ?afib ?prednisone  Chronic hfpef

## 2024-02-27 NOTE — ED Provider Notes (Addendum)
 [place] EMERGENCY DEPARTMENT AT [facility] Provider Note   CSN: [id number] Arrival date & time: 02/27/24  2133     Patient presents with: Respiratory Distress   Minnie Shi is a 63 y.o. male.   Patient is a 63 year old male with past medical history of CHF and COPD actively smoking approximately 10 cigarettes/day presenting for hypoxia.  EMS was called to the house for respiratory distress.  Patient was found to have wheezing throughout, tachypnea, tachycardia, and hypoxia at 79 oxygen  saturation on his home 3 L nasal cannula while receiving home DuoNeb treatment.  EMS gave him 2 more DuoNeb treatments and route, Solu-Medrol  125 mg, and magnesium  2 g IV.  On arrival he is still tachypneic and tachycardic, using accessory respiratory muscles, satting at 99% on nonrebreather.  He denies any fevers or coughing.  Denies lower extremity swelling, orthopnea, or recent weight gain.  The history is provided by the patient. No language interpreter was used.       Prior to Admission medications   Medication Sig Start Date End Date Taking? Authorizing Provider  albuterol  (VENTOLIN  HFA) 108 (90 Base) MCG/ACT inhaler Inhale 1-2 puffs into the lungs every 6 (six) hours as needed for wheezing or shortness of breath. 02/15/24  Yes Jerral Meth, MD  Albuterol -Budesonide  90-80 MCG/ACT AERO Inhale 2 puffs into the lungs every 4 (four) hours as needed (wheeze, shortness of breath). 01/23/24  Yes Hunsucker, [name], MD  apixaban  (ELIQUIS ) 5 MG TABS tablet Take 1 tablet (5 mg total) by mouth 2 (two) times daily. 02/01/24  Yes Marylu Gee, DO  atorvastatin  (LIPITOR ) 40 MG tablet Take 1 tablet (40 mg total) by mouth daily. 02/01/24  Yes Marylu Gee, DO  benzonatate  (TESSALON ) 200 MG capsule Take 1 capsule (200 mg total) by mouth 3 (three) times daily as needed for cough. 02/01/24  Yes Amilibia, Jaden, DO  buPROPion (WELLBUTRIN SR) 150 MG 12 hr tablet Take 1 tablet (150 mg total) by mouth  2 (two) times daily. Take 1 tablet once daily for the first 3 days (on 10/15-10/17), then take one tablet twice daily (10/18 and onward) 02/01/24  Yes Amilibia, Jaden, DO  diltiazem  (CARDIZEM  CD) 120 MG 24 hr capsule Take 1 capsule (120 mg total) by mouth daily. 02/01/24  Yes Marylu Gee, DO  Dupilumab  (DUPIXENT ) 300 MG/2ML SOAJ Inject 300 mg into the skin every 14 (fourteen) days. 01/26/24  Yes Hunsucker, [name], MD  fluticasone  (FLONASE ) 50 MCG/ACT nasal spray Place 1 spray into both nostrils daily. 01/25/24  Yes [name], [name], DO  Fluticasone -Umeclidin-Vilant (TRELEGY ELLIPTA ) 100-62.5-25 MCG/ACT AEPB Inhale 1 puff into the lungs daily. 11/21/23 03/05/24 Yes Marylu Gee, DO  guaiFENesin -dextromethorphan  (ROBITUSSIN DM) 100-10 MG/5ML syrup Take 15 mLs by mouth 4 (four) times daily. 02/01/24  Yes Amilibia, Jaden, DO  ipratropium-albuterol  (DUONEB) 0.5-2.5 (3) MG/3ML SOLN Take 3 mLs by nebulization every 6 (six) hours as needed. 01/23/24  Yes Hunsucker, [name], MD  loratadine  (CLARITIN ) 10 MG tablet Take 1 tablet (10 mg total) by mouth daily. 11/28/23 11/27/24 Yes [name], [name], DO  losartan  (COZAAR ) 50 MG tablet Take 1 tablet (50 mg total) by mouth daily. 02/01/24  Yes Marylu Gee, DO  metoprolol  tartrate (LOPRESSOR ) 25 MG tablet Take 1 tablet (25 mg total) by mouth 2 (two) times daily. 02/01/24  Yes Marylu Gee, DO  nitroGLYCERIN  (NITROSTAT ) 0.4 MG SL tablet Place 1 tablet (0.4 mg total) under the tongue every 5 (five) minutes x 3 doses as needed for chest pain.  11/17/23  Yes [name], [name], DO  OXYGEN  Inhale 2 L/min into the lungs continuous.   Yes [provider]  predniSONE  (DELTASONE ) 10 MG tablet Take 4 tablets by mouth daily with breakfast for 2 days, THEN 3 tablets daily with breakfast for 7 days, THEN 2 tablets daily with breakfast for 7 days, THEN 1 tablet daily with breakfast for 7 days, THEN 1/2 tablet daily with breakfast for 7 days. 02/15/24 03/16/24 Yes Jerral Meth, MD  thiamine  (VITAMIN B-1) 100 MG tablet Take 1 tablet (100 mg total) by mouth daily. 08/15/23  Yes Verdene Purchase, MD    Allergies: Ivp dye [iodinated contrast media], Tramadol, and Doxycycline     Review of Systems  Constitutional:  Negative for chills and fever.  HENT:  Negative for ear pain and sore throat.   Eyes:  Negative for pain and visual disturbance.  Respiratory:  Positive for shortness of breath and wheezing. Negative for cough.   Cardiovascular:  Negative for chest pain and palpitations.  Gastrointestinal:  Negative for abdominal pain and vomiting.  Genitourinary:  Negative for dysuria and hematuria.  Musculoskeletal:  Negative for arthralgias and back pain.  Skin:  Negative for color change and rash.  Neurological:  Negative for seizures and syncope.  All other systems reviewed and are negative.   Updated Vital Signs BP 137/84   Pulse (!) 105   Temp 98 F (36.7 C) (Oral)   Resp 19   Ht 6' (1.829 m)   Wt 90.7 kg   SpO2 97%   BMI 27.12 kg/m   Physical Exam Vitals and nursing note reviewed.  Constitutional:      General: He is not in acute distress.    Appearance: He is well-developed.  HENT:     Head: Normocephalic and atraumatic.  Eyes:     Conjunctiva/sclera: Conjunctivae normal.  Cardiovascular:     Rate and Rhythm: Regular rhythm. Tachycardia present.     Heart sounds: No murmur heard. Pulmonary:     Effort: Tachypnea, respiratory distress and retractions present.     Breath sounds: Decreased air movement present. Examination of the right-upper field reveals wheezing. Examination of the left-upper field reveals wheezing. Examination of the right-middle field reveals wheezing. Examination of the left-middle field reveals wheezing. Examination of the right-lower field reveals wheezing. Examination of the left-lower field reveals wheezing. Wheezing present.  Abdominal:     Palpations: Abdomen is soft.     Tenderness: There is no abdominal  tenderness.  Musculoskeletal:        General: No swelling.     Cervical back: Neck supple.  Skin:    General: Skin is warm and dry.     Capillary Refill: Capillary refill takes less than 2 seconds.  Neurological:     Mental Status: He is alert.  Psychiatric:        Mood and Affect: Mood normal.     (all labs ordered are listed, but only abnormal results are displayed) Labs Reviewed  CBC WITH DIFFERENTIAL/PLATELET - Abnormal; Notable for the following components:      Result Value   WBC 12.0 (*)    Neutro Abs 9.1 (*)    All other components within normal limits  I-STAT CHEM 8, ED - Abnormal; Notable for the following components:   Glucose, Bld 136 (*)    TCO2 33 (*)    All other components within normal limits  I-STAT VENOUS BLOOD GAS, ED - Abnormal; Notable for the following components:   pO2, Ven  52 (*)    Bicarbonate 34.7 (*)    TCO2 36 (*)    Acid-Base Excess 8.0 (*)    All other components within normal limits  BASIC METABOLIC PANEL WITH GFR    EKG: None  Radiology: DG Chest Portable 1 View Result Date: 02/27/2024 CLINICAL DATA:  Shortness of breath EXAM: PORTABLE CHEST 1 VIEW COMPARISON:  02/26/2024, CT 11/28/2023 FINDINGS: The heart size and mediastinal contours are within normal limits. Both lungs are clear. The visualized skeletal structures are unremarkable. IMPRESSION: No active disease. Electronically Signed   By: Luke Bun M.D.   On: 02/27/2024 22:15   DG Chest Port 1 View Result Date: 02/26/2024 CLINICAL DATA:  Shortness of breath, respiratory distress EXAM: PORTABLE CHEST 1 VIEW COMPARISON:  02/15/2024 FINDINGS: The heart size and mediastinal contours are within normal limits. Mild hyperinflation noted. Both lungs are clear. The visualized skeletal structures are unremarkable. IMPRESSION: Mild hyperinflation. No acute process. Electronically Signed   By: CHRISTELLA.  Shick M.D.   On: 02/26/2024 08:16     .Critical Care  Performed by: Elnor Bernarda SQUIBB,  DO Authorized by: Elnor Bernarda SQUIBB, DO   Critical care provider statement:    Critical care time (minutes):  101   Critical care was necessary to treat or prevent imminent or life-threatening deterioration of the following conditions:  Respiratory failure   Critical care was time spent personally by me on the following activities:  Development of treatment plan with patient or surrogate, discussions with consultants, evaluation of patient's response to treatment, examination of patient, ordering and review of laboratory studies, ordering and review of radiographic studies, ordering and performing treatments and interventions, pulse oximetry, re-evaluation of patient's condition and review of old charts   Care discussed with: admitting provider      Medications Ordered in the ED  ipratropium-albuterol  (DUONEB) 0.5-2.5 (3) MG/3ML nebulizer solution 3 mL (has no administration in time range)                                    Medical Decision Making Amount and/or Complexity of Data Reviewed Labs: ordered. Radiology: ordered.  Risk Prescription drug management. Decision regarding hospitalization.   63 year old male with past medical history of CHF and COPD actively smoking approximately 10 cigarettes/day presenting for hypoxia.  Patient is alert and oriented x 3, tachycardic, hypertensive, tachypneic, retracting, and wheezing throughout.  Diminished lung sounds throughout.  There is for COPD exacerbation.  DuoNeb in process.  Solu-Medrol  already given.  Magnesium  already given.  Chart review demonstrates patient was seen yesterday on 02/26/2024 for COPD exacerbation and recommended for admission and declined.  Twelve-lead EKG demonstrates no ischemic changes.  Laboratory studies demonstrate leukocytosis of 12.  Stable electrolytes.  Some hypercapnia with a pCO2 of 58.  On reevaluation patient is resting comfortably in the bed sleeping.  His tachypnea has improved to 20 breaths/min.  Patient  currently satting at 99% on 5 L nasal cannula.  Chest x-ray demonstrates no focal pneumonias.  No pneumothorax.  No acute process.  Nebulized albuterol  every 4 hours as needed ordered.  Patient admitted for admission to the hospital for failed outpatient therapy for COPD exacerbation as well as acute respiratory failure with hypoxia.  Patient agreeable to plan.  Patient accepted by internal medicine team.    Final diagnoses:  COPD exacerbation (HCC)  Acute respiratory failure with hypoxia [facility])    ED Discharge Orders  None        Elnor Bernarda SQUIBB, DO 02/28/24 0008

## 2024-02-27 NOTE — ED Notes (Addendum)
 CCMD called for tele monitoring

## 2024-02-28 ENCOUNTER — Other Ambulatory Visit (HOSPITAL_COMMUNITY): Payer: Self-pay

## 2024-02-28 DIAGNOSIS — J441 Chronic obstructive pulmonary disease with (acute) exacerbation: Principal | ICD-10-CM

## 2024-02-28 DIAGNOSIS — J9601 Acute respiratory failure with hypoxia: Secondary | ICD-10-CM | POA: Diagnosis not present

## 2024-02-28 DIAGNOSIS — R079 Chest pain, unspecified: Secondary | ICD-10-CM | POA: Diagnosis present

## 2024-02-28 LAB — BASIC METABOLIC PANEL WITH GFR
Anion gap: 7 (ref 5–15)
BUN: 13 mg/dL (ref 8–23)
CO2: 31 mmol/L (ref 22–32)
Calcium: 8.6 mg/dL — ABNORMAL LOW (ref 8.9–10.3)
Chloride: 102 mmol/L (ref 98–111)
Creatinine, Ser: 1.13 mg/dL (ref 0.61–1.24)
GFR, Estimated: >60 mL/min
Glucose, Bld: 136 mg/dL — ABNORMAL HIGH (ref 70–99)
Potassium: 4.4 mmol/L (ref 3.5–5.1)
Sodium: 140 mmol/L (ref 135–145)

## 2024-02-28 LAB — CBC
HCT: 43.4 % (ref 39.0–52.0)
Hemoglobin: 13.8 g/dL (ref 13.0–17.0)
MCH: 31.2 pg (ref 26.0–34.0)
MCHC: 31.8 g/dL (ref 30.0–36.0)
MCV: 98 fL (ref 80.0–100.0)
Platelets: 178 K/uL (ref 150–400)
RBC: 4.43 MIL/uL (ref 4.22–5.81)
RDW: 13.4 % (ref 11.5–15.5)
WBC: 10.7 K/uL — ABNORMAL HIGH (ref 4.0–10.5)
nRBC: 0 % (ref 0.0–0.2)

## 2024-02-28 MED ORDER — ATORVASTATIN CALCIUM 40 MG PO TABS
40.0000 mg | ORAL_TABLET | Freq: Every day | ORAL | Status: DC
  Administered 2024-02-28: 40 mg via ORAL
  Filled 2024-02-28: qty 1

## 2024-02-28 MED ORDER — NICOTINE 21 MG/24HR TD PT24
21.0000 mg | MEDICATED_PATCH | TRANSDERMAL | 1 refills | Status: AC
  Filled 2024-02-28: qty 28, 28d supply, fill #0

## 2024-02-28 MED ORDER — DILTIAZEM HCL ER COATED BEADS 120 MG PO CP24
120.0000 mg | ORAL_CAPSULE | Freq: Every day | ORAL | Status: DC
  Administered 2024-02-28: 120 mg via ORAL
  Filled 2024-02-28 (×2): qty 1

## 2024-02-28 MED ORDER — IPRATROPIUM-ALBUTEROL 0.5-2.5 (3) MG/3ML IN SOLN
3.0000 mL | RESPIRATORY_TRACT | 0 refills | Status: DC | PRN
  Filled 2024-02-28: qty 360, 20d supply, fill #0

## 2024-02-28 MED ORDER — METOPROLOL TARTRATE 25 MG PO TABS
25.0000 mg | ORAL_TABLET | Freq: Two times a day (BID) | ORAL | Status: DC
  Administered 2024-02-28: 25 mg via ORAL
  Filled 2024-02-28: qty 1

## 2024-02-28 MED ORDER — IPRATROPIUM-ALBUTEROL 0.5-2.5 (3) MG/3ML IN SOLN
3.0000 mL | RESPIRATORY_TRACT | 6 refills | Status: AC | PRN
  Filled 2024-02-28: qty 360, 20d supply, fill #0

## 2024-02-28 MED ORDER — LOSARTAN POTASSIUM 50 MG PO TABS
50.0000 mg | ORAL_TABLET | Freq: Every day | ORAL | Status: DC
  Administered 2024-02-28: 50 mg via ORAL
  Filled 2024-02-28: qty 1

## 2024-02-28 MED ORDER — IPRATROPIUM-ALBUTEROL 0.5-2.5 (3) MG/3ML IN SOLN
3.0000 mL | RESPIRATORY_TRACT | 6 refills | Status: DC | PRN
  Filled 2024-02-28: qty 360, 20d supply, fill #0

## 2024-02-28 MED ORDER — PREDNISONE 50 MG PO TABS
50.0000 mg | ORAL_TABLET | Freq: Every day | ORAL | 0 refills | Status: DC
  Filled 2024-02-28: qty 4, 4d supply, fill #0

## 2024-02-28 MED ORDER — ARFORMOTEROL TARTRATE 15 MCG/2ML IN NEBU
15.0000 ug | INHALATION_SOLUTION | Freq: Two times a day (BID) | RESPIRATORY_TRACT | Status: DC
  Administered 2024-02-28 (×2): 15 ug via RESPIRATORY_TRACT
  Filled 2024-02-28 (×2): qty 2

## 2024-02-28 MED ORDER — IPRATROPIUM-ALBUTEROL 0.5-2.5 (3) MG/3ML IN SOLN
3.0000 mL | Freq: Four times a day (QID) | RESPIRATORY_TRACT | 0 refills | Status: DC | PRN
  Filled 2024-02-28: qty 360, 30d supply, fill #0

## 2024-02-28 MED ORDER — REVEFENACIN 175 MCG/3ML IN SOLN
175.0000 ug | Freq: Every day | RESPIRATORY_TRACT | Status: DC
  Administered 2024-02-28: 175 ug via RESPIRATORY_TRACT
  Filled 2024-02-28: qty 3

## 2024-02-28 MED ORDER — APIXABAN 5 MG PO TABS
5.0000 mg | ORAL_TABLET | Freq: Two times a day (BID) | ORAL | Status: DC
  Administered 2024-02-28 (×2): 5 mg via ORAL
  Filled 2024-02-28 (×2): qty 1

## 2024-02-28 MED ORDER — IPRATROPIUM-ALBUTEROL 0.5-2.5 (3) MG/3ML IN SOLN
3.0000 mL | RESPIRATORY_TRACT | 1 refills | Status: DC | PRN
  Filled 2024-02-28: qty 360, 20d supply, fill #0

## 2024-02-28 MED ORDER — IPRATROPIUM-ALBUTEROL 0.5-2.5 (3) MG/3ML IN SOLN
3.0000 mL | RESPIRATORY_TRACT | Status: DC
  Administered 2024-02-28 (×2): 3 mL via RESPIRATORY_TRACT
  Filled 2024-02-28: qty 3

## 2024-02-28 MED ORDER — BUDESONIDE 0.25 MG/2ML IN SUSP
0.2500 mg | Freq: Two times a day (BID) | RESPIRATORY_TRACT | Status: DC
  Administered 2024-02-28 (×2): 0.25 mg via RESPIRATORY_TRACT
  Filled 2024-02-28 (×2): qty 2

## 2024-02-28 MED ORDER — IPRATROPIUM-ALBUTEROL 0.5-2.5 (3) MG/3ML IN SOLN
3.0000 mL | Freq: Four times a day (QID) | RESPIRATORY_TRACT | 6 refills | Status: DC | PRN
  Filled 2024-02-28: qty 360, 30d supply, fill #0

## 2024-02-28 MED ORDER — PREDNISONE 5 MG PO TABS
50.0000 mg | ORAL_TABLET | Freq: Every day | ORAL | Status: DC
  Administered 2024-02-28: 50 mg via ORAL
  Filled 2024-02-28: qty 2

## 2024-02-28 NOTE — ED Notes (Signed)
 Pt sitting up in bed, pt talking in full sentences, resps even and unlabored, pt states that he is ready to go home, read and reviewed d/c instructions with pt, pt verbalized understanding, pt states that he uses O2 at home prn, states that he has O2 at home, states that his ride is here to get him, pt from department via wheel chair.

## 2024-02-28 NOTE — Discharge Instructions (Signed)
 Dear Francisco [name], It was a pleasure taking care of you at [facility]. You were admitted for shortness of breath and treated for COPD exacerbation. We are discharging you home now that you are doing better. Please follow the following instructions.  1) Please continue to use a nicotine  patch to help reduce the craving for cigarette which we think is the main cause of your multiple COPD exacerbations  2) Continue to use your inhalers as discussed  3) Use oxygen  exactly as prescribed. Do not change flow rate without your doctor's approval.  4) Follow up with your pulmonologist as scheduled     Take care,  Dr. Drue Grow, MD

## 2024-02-28 NOTE — ED Notes (Signed)
 Pt states that he is tired of waiting, states that he wants to go home if the md is not going to come see him,  MD notified, states that she is rounding and will be to seen him soon

## 2024-02-28 NOTE — ED Notes (Addendum)
 Patient has call bell

## 2024-02-28 NOTE — Discharge Summary (Signed)
 Name: Francisco [name] MRN: [id number] DOB: 01/25/1961 63 y.o. PCP: Francisco Barren, DO  Date of Admission: 02/27/2024  9:33 PM Date of Discharge: 02/28/2024 Attending Physician: Dr. Francesco   Discharge Diagnosis: Principal Problem:   COPD with acute exacerbation [name]) Active Problems:   Dilated cardiomyopathy (HCC)   CAD S/P percutaneous coronary angioplasty   Methamphetamine use   Essential hypertension   PAF (paroxysmal atrial fibrillation) (HCC)   Acute on chronic hypoxic respiratory failure (HCC)   Chest pain    Discharge Medications: Allergies as of 02/28/2024       Reactions   Ivp Dye [iodinated Contrast Media] Other (See Comments)   Seizures   Tramadol Other (See Comments)   Seizures   Doxycycline  Other (See Comments)   Burning sensation to his hands.        Medication List     TAKE these medications    Airsupra 90-80 MCG/ACT Aero Generic drug: Albuterol -Budesonide  Inhale 2 puffs into the lungs every 4 (four) hours as needed (wheeze, shortness of breath).   albuterol  108 (90 Base) MCG/ACT inhaler Commonly known as: VENTOLIN  HFA Inhale 1-2 puffs into the lungs every 6 (six) hours as needed for wheezing or shortness of breath.   apixaban  5 MG Tabs tablet Commonly known as: Eliquis  Take 1 tablet (5 mg total) by mouth 2 (two) times daily.   atorvastatin  40 MG tablet Commonly known as: LIPITOR  Take 1 tablet (40 mg total) by mouth daily.   benzonatate  200 MG capsule Commonly known as: TESSALON  Take 1 capsule (200 mg total) by mouth 3 (three) times daily as needed for cough.   buPROPion 150 MG 12 hr tablet Commonly known as: Wellbutrin SR Take 1 tablet (150 mg total) by mouth 2 (two) times daily. Take 1 tablet once daily for the first 3 days (on 10/15-10/17), then take one tablet twice daily (10/18 and onward)   diltiazem  120 MG 24 hr capsule Commonly known as: CARDIZEM  CD Take 1 capsule (120 mg total) by mouth daily.   Dupixent  300 MG/2ML  Soaj Generic drug: Dupilumab  Inject 300 mg into the skin every 14 (fourteen) days.   fluticasone  50 MCG/ACT nasal spray Commonly known as: FLONASE  Place 1 spray into both nostrils daily.   guaiFENesin -dextromethorphan  100-10 MG/5ML syrup Commonly known as: ROBITUSSIN DM Take 15 mLs by mouth 4 (four) times daily.   ipratropium-albuterol  0.5-2.5 (3) MG/3ML Soln Commonly known as: DUONEB Take 3 mLs by nebulization every 4 (four) hours as needed. What changed: when to take this   loratadine  10 MG tablet Commonly known as: Claritin  Take 1 tablet (10 mg total) by mouth daily.   losartan  50 MG tablet Commonly known as: COZAAR  Take 1 tablet (50 mg total) by mouth daily.   metoprolol  tartrate 25 MG tablet Commonly known as: LOPRESSOR  Take 1 tablet (25 mg total) by mouth 2 (two) times daily.   nicotine  21 mg/24hr patch Commonly known as: NICODERM CQ  - dosed in mg/24 hours Place 1 patch (21 mg total) onto the skin daily.   nitroGLYCERIN  0.4 MG SL tablet Commonly known as: NITROSTAT  Place 1 tablet (0.4 mg total) under the tongue every 5 (five) minutes x 3 doses as needed for chest pain.   OXYGEN  Inhale 2 L/min into the lungs continuous.   predniSONE  50 MG tablet Commonly known as: DELTASONE  Take 1 tablet (50 mg total) by mouth daily with breakfast. Start taking on: February 29, 2024 What changed:  medication strength See the new instructions.   thiamine  100  MG tablet Commonly known as: VITAMIN B1 Take 1 tablet (100 mg total) by mouth daily.   Trelegy Ellipta  100-62.5-25 MCG/ACT Aepb Generic drug: Fluticasone -Umeclidin-Vilant Inhale 1 puff into the lungs daily.        Disposition and follow-up:   Francisco [name] was discharged from Francisco [name] in Stable condition.  At the [name] follow up visit please address:  1.  Follow-up: a. Shortness of Breath: Please assess the patient's dyspnea and ensure adherence to his home inhaler regimen.  b. Smoking  Cessation: The patient received counseling on smoking cessation and is being discharged with a 21 mg nicotine  patch. Please assess his progress with smoking cessation efforts and manage as clinically indicated.   2.  Labs / imaging needed at time of follow-up: None   3.  Pending labs/ test needing follow-up: None    Follow-up Appointments:  Follow-up Information     [name], [name], DO. Schedule an appointment as soon as possible for a visit.   Specialty: Internal Medicine Contact information: [address] [place], [address] [place] [address] [phone]                 [name] Course by problem list: #COPD with acute exacerbation Francisco [name] is a 62 year old man with severe COPD, tobacco and substance use, and underlying heart disease who was admitted for an acute COPD exacerbation presenting with dyspnea. On admission, there was no evidence of leukocytosis or fever, and chest imaging showed no airspace disease or pleural effusion. The exacerbation was attributed to ongoing tobacco use, and the patient received extensive counseling on smoking cessation. Initially, he required 6 L/min of supplemental oxygen  via nasal cannula but improved with bronchodilator therapy and systemic corticosteroids, allowing transition back to his baseline of 2 L/min. His home nicotine  patch was increased from 14 mg to 21 mg to support smoking cessation. The patient has follow-up appointments scheduled with cardiology and pulmonology later this week. He was discharged on prednisone  50 mg daily for four additional doses, after which he is to resume his home tapering regimen unless otherwise directed by his pulmonologist.  Dilated cardiomyopathy  Chronic, stable. No volume overload or decompensation on admission.   CAD S/P percutaneous coronary angioplasty Chronic, stable. Chest pain not from ACS but rather related to COPD exacerbation.  His atorvastatin  40 mg daily was resumed   Methamphetamine  use Chronic via IV route. Using weekly. Has a blister over recent injection site that doesn't look infected. No clinically significant withdrawal now.   Essential hypertension Patient was continued on diltiazem  120 mg and losartan  50 mg  PAF (paroxysmal atrial fibrillation) Patient was in sinus rhythm on admission.  We resumed his outpatient diltiazem  and Eliquis  5 mg twice daily    Discharge Subjective: Patient was seen and evaluated at the bedside this morning. He was lying in bed in no acute distress. He denied any worsening shortness of breath, chest pain, or confusion. He inquired about when he could go home, noting that his wife was on her way to pick him up. We discussed the importance of smoking cessation and strategies to reduce the frequency of hospitalizations related to COPD exacerbations.  Discharge Exam:   BP (!) 164/86 (BP Location: Right Arm)   Pulse (!) 104   Temp 97.8 F (36.6 C) (Oral)   Resp 18   Ht 6' (1.829 m)   Wt 90.7 kg   SpO2 97%   BMI 27.12 kg/m   Well-appearing man sitting in bed on  2 L nasal cannula. Head is normocephalic and atraumatic with moist mucous membranes. Cardiovascular exam reveals regular rate and rhythm without murmurs, rubs, or gallops.  Pulmonary exam notable for diffuse wheezing. Abdomen is soft, non-tender, and non-distended. Musculoskeletal exam shows normal bulk and tone. Neurologically, the patient is alert and oriented 3 with 5/5 strength in bilateral upper and lower extremities. Skin is warm and dry.  Pertinent Labs, Studies, and Procedures:     Latest Ref Rng & Units 02/28/2024    3:25 AM 02/27/2024   10:00 PM 02/27/2024    9:44 PM  CBC  WBC 4.0 - 10.5 K/uL 10.7   12.0   Hemoglobin 13.0 - 17.0 g/dL 86.1  86.0    86.0  86.3   Hematocrit 39.0 - 52.0 % 43.4  41.0    41.0  42.5   Platelets 150 - 400 K/uL 178   197        Latest Ref Rng & Units 02/28/2024    3:25 AM 02/27/2024   10:00 PM 02/27/2024    9:44 PM  CMP   Glucose 70 - 99 mg/dL 863  863  866   BUN 8 - 23 mg/dL 13  17  13    Creatinine 0.61 - 1.24 mg/dL 8.86  8.89  8.91   Sodium 135 - 145 mmol/L 140  142    143  146   Potassium 3.5 - 5.1 mmol/L 4.4  3.8    3.8  4.0   Chloride 98 - 111 mmol/L 102  101  103   CO2 22 - 32 mmol/L 31   31   Calcium  8.9 - 10.3 mg/dL 8.6   9.0     DG Chest Portable 1 View Result Date: 02/27/2024 CLINICAL DATA:  Shortness of breath EXAM: PORTABLE CHEST 1 VIEW COMPARISON:  02/26/2024, CT 11/28/2023 FINDINGS: The heart size and mediastinal contours are within normal limits. Both lungs are clear. The visualized skeletal structures are unremarkable. IMPRESSION: No active disease. Electronically Signed   By: Luke Bun M.D.   On: 02/27/2024 22:15     Signed: Drue Grow, MD Internal Medicine Teaching Service Pager [phone]

## 2024-02-28 NOTE — ED Notes (Addendum)
Patient is resting

## 2024-02-28 NOTE — ED Notes (Signed)
 Pt in bed, pt talking in full sentences, resps even and unlabored pt denies pain, pt has no complaints at this time.

## 2024-02-28 NOTE — H&P (Signed)
 Date: 02/28/2024         Patient Name:  Francisco [name] MRN: [id number]  DOB: 11/12/1960 Age / Sex: 63 y.o., male   PCP: Heddy Barren, DO         Medical Service: Internal Medicine Teaching Service         Attending Physician: Dr. Francesco Elsie NOVAK, MD    First Contact: Dr. Edgardo Pager: [phone]  Second Contact: Dr. Renne Pager: [phone]                 Chief Concern: Shortness of breath and chest pain  History of Present Illness: 63 year old came to the hospital by EMS for shortness of breath. Started while he was sitting on the couch watching TV. He has exertional dyspnea that has worsened over the past couple of weeks and now he has significant rest dyspnea. He had chest discomfort that has resolved. He has a cough productive of tan sputum. EMS noted hypoxia to ~70s and transported to hospital, giving solumedrol, magnesium , and duonebs en route.  He has COPD and frequent admissions for exacerbations. He takes LAMA/LABA/ICS and Dupixent . He is on a prednisone  taper outside of hospital. He uses 2 liter/min supplemental oxygen  continuously. He has history of CAD with coronary artery stent and dilated cardiomyopathy. He has history of paroxysmal a-fib and takes Eliquis , diltiazem , and metoprolol .  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 He smokes 10 cigarettes a day and is trying to quit with Wellbutrin but doesn't notice it helping. He uses methamphetamine via IV route weekly but is trying to quit.  In ED he required 6 liter/min to maintain acceptable spo2. Duonebs were started. I was paged to evaluate for admission because of sustained elevated supplemental O2 requirement.   Allergies: Allergies  Allergen Reactions   Ivp Dye [Iodinated Contrast Media] Other (See Comments)    Seizures    Tramadol Other (See Comments)    Seizures    Doxycycline  Other (See Comments)    Burning sensation to his hands.    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 s/p prior PCI.   Hepatitis-C    History of CHF (congestive heart failure) 01/28/2024   History of stroke 02/13/2023   Hypertension    IV drug abuse (HCC)    a. 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   Medically  noncompliant    MI (myocardial infarction) (HCC)    TIA (transient ischemic attack) 02/14/2023   Tobacco abuse     Medications: No current facility-administered medications on file prior to encounter.   Current Outpatient Medications on File Prior to Encounter  Medication Sig Dispense Refill   albuterol  (VENTOLIN  HFA) 108 (90 Base) MCG/ACT inhaler Inhale 1-2 puffs into the lungs every 6 (six) hours as needed for wheezing or shortness of breath. 6.7 g 0   Albuterol -Budesonide  90-80 MCG/ACT AERO Inhale 2 puffs into the lungs every 4 (four)  hours as needed (wheeze, shortness of breath). 10.7 g 12   apixaban  (ELIQUIS ) 5 MG TABS tablet Take 1 tablet (5 mg total) by mouth 2 (two) times daily. 60 tablet 0   atorvastatin  (LIPITOR ) 40 MG tablet Take 1 tablet (40 mg total) by mouth daily. 30 tablet 0   benzonatate  (TESSALON ) 200 MG capsule Take 1 capsule (200 mg total) by mouth 3 (three) times daily as needed for cough. 20 capsule 0   buPROPion (WELLBUTRIN SR) 150 MG 12 hr tablet Take 1 tablet (150 mg total) by mouth 2 (two) times daily. Take 1 tablet once daily for the first 3 days (on 10/15-10/17), then take one tablet twice daily (10/18 and onward) 60 tablet 20   diltiazem  (CARDIZEM  CD) 120 MG 24 hr capsule Take 1 capsule (120 mg total) by mouth daily. 30 capsule 0   Dupilumab  (DUPIXENT ) 300 MG/2ML SOAJ Inject 300 mg into the skin every 14 (fourteen) days. 4 mL 5   fluticasone  (FLONASE ) 50 MCG/ACT nasal spray Place 1 spray into both nostrils daily. 16 g 0   Fluticasone -Umeclidin-Vilant (TRELEGY ELLIPTA ) 100-62.5-25 MCG/ACT AEPB Inhale 1 puff into the lungs daily. 60 each 3   guaiFENesin -dextromethorphan  (ROBITUSSIN DM) 100-10 MG/5ML syrup Take 15 mLs by mouth 4 (four) times daily. 118 mL 0   ipratropium-albuterol  (DUONEB) 0.5-2.5 (3) MG/3ML SOLN Take 3 mLs by nebulization every 6 (six) hours as needed. 360 mL 6   loratadine  (CLARITIN ) 10 MG tablet Take 1 tablet (10 mg total) by mouth daily. 100 tablet 0   losartan  (COZAAR ) 50 MG tablet Take 1 tablet (50 mg total) by mouth daily. 30 tablet 0   metoprolol  tartrate (LOPRESSOR ) 25 MG tablet Take 1 tablet (25 mg total) by mouth 2 (two) times daily. 60 tablet 0   nitroGLYCERIN  (NITROSTAT ) 0.4 MG SL tablet Place 1 tablet (0.4 mg total) under the tongue every 5 (five) minutes x 3 doses as needed for chest pain. 25 tablet 2   OXYGEN  Inhale 2 L/min into the lungs continuous.     predniSONE  (DELTASONE ) 10 MG tablet Take 4 tablets by mouth daily with breakfast for 2 days, THEN 3 tablets daily with  breakfast for 7 days, THEN 2 tablets daily with breakfast for 7 days, THEN 1 tablet daily with breakfast for 7 days, THEN 1/2 tablet daily with breakfast for 7 days. 54 tablet 0   thiamine  (VITAMIN B-1) 100 MG tablet Take 1 tablet (100 mg total) by mouth daily. 30 tablet 1     Surgical History: Past Surgical History:  Procedure Laterality Date   BACK SURGERY     NECK SURGERY     RIGHT/LEFT HEART CATH AND CORONARY ANGIOGRAPHY N/A 04/30/2019   Procedure: RIGHT/LEFT HEART CATH AND CORONARY ANGIOGRAPHY;  Surgeon: Anner Alm ORN, MD;  Location: [facility] INVASIVE CV LAB;  Service: Cardiovascular;  Laterality: N/A;    Family History:  Family History  Problem Relation Age of Onset  Rheum arthritis Mother    CVA Father    Heart disease Paternal Grandfather     Social History:  Social History   Socioeconomic History   Marital status: Divorced    Spouse name: Not on file   Number of children: Not on file   Years of education: Not on file   Highest education level: Not on file  Occupational History   Not on file  Tobacco Use   Smoking status: Every Day    Current packs/day: 0.50    Types: Cigarettes    Passive exposure: Current   Smokeless tobacco: Never   Tobacco comments:    Half a pack a day as of 12/07/2023  Vaping Use   Vaping status: Never Used  Substance and Sexual Activity   Alcohol use: Never   Drug use: Yes    Frequency: 2.0 times per week    Types: Methamphetamines    Comment: active, has been to rehab. has been to [facility] (did not work). Prev heroin.   Sexual activity: Not Currently  Other Topics Concern   Not on file  Social History Narrative   Not on file   Social Drivers of Health   Financial Resource Strain: High Risk (08/24/2023)   Received from [facility]   Overall Financial Resource Strain (CARDIA)    Difficulty of Paying Living Expenses: Hard  Food Insecurity: No Food Insecurity (02/03/2024)   Hunger Vital Sign    Worried About Running  Out of Food in the Last Year: Never true    Ran Out of Food in the Last Year: Never true  Transportation Needs: No Transportation Needs (02/03/2024)   PRAPARE - Administrator, Civil Service (Medical): No    Lack of Transportation (Non-Medical): No  Physical Activity: Not on file  Stress: Not on file  Social Connections: Unknown (08/14/2023)   Social Connection and Isolation Panel    Frequency of Communication with Friends and Family: Twice a week    Frequency of Social Gatherings with Friends and Family: Not on file    Attends Religious Services: 1 to 4 times per year    Active Member of [employer] or Organizations: No    Attends Banker Meetings: Never    Marital Status: Separated  Intimate Partner Violence: Not At Risk (02/03/2024)   Humiliation, Afraid, Rape, and Kick questionnaire    Fear of Current or Ex-Partner: No    Emotionally Abused: No    Physically Abused: No    Sexually Abused: No      Physical Exam: Blood pressure (!) 163/104, pulse (!) 102, temperature 97.6 F (36.4 C), temperature source Oral, resp. rate (!) 26, height 6' (1.829 m), weight 90.7 kg, SpO2 98%.  No distress Moist pink oral mucosa Heart rate and rhythm normal, strong radial pulses, no murmurs, no LE edema, no JVD Breathing somewhat rapidly, no accessory muscle use, speaking in full sentences, diffuse wheezing and coarse crackles throughout all lung fields Skin warm and dry, small serous blister dorsal right wrist but no surrounding warmth or erythema Alert and oriented, normal speech, no facial asymmetry  EKG:  Sinus tachycardia 114 BPM without new ST segment changes compared to prior EKGs  Labs: Venous CO2 58.3  Images and other studies: CXR without airspace opacities or pleural effusion  Assessment & Plan:  Steele Stracener is a 63 y.o. with severe COPD, tobacco and substance use, heart disease who presents with dyspnea and is admitted for COPD exacerbation.  Principal  Problem:   COPD with acute exacerbation (HCC)   Acute on chronic hypoxic respiratory failure Clinically stable and improving with treatment by EMS. No heart failure, pneumonia, or PE. Stressed importance of smoking cessation. Continue steroids, bronchodilators, maintenance therapy via nebulizer. Wean O2 to 2 liter/min. Check ambulatory SpO2. Wonder if stopping outpatient metoprolol  will help reduce frequency of bronchospasm. Close outpatient pulmonology follow-up. -prednisone  50 mg daily x 5, resume prednisone  taper on discharge -duoneb q 4 h while awake -LAMA/LABA/ICS via neb  Active Problems:   Dilated cardiomyopathy (HCC) Chronic, stable. No volume overload or decompensation tonight. He's had some weight gain recently which I attribute to treatment with steroids of late.    CAD S/P percutaneous coronary angioplasty Chronic, stable. Chest pain not from ACS but rather related to COPD exacerbation. Resume statin. -atorvastatin  40 mg daily    Methamphetamine use Chronic via IV route. Using weekly. Has a blister over recent injection site that doesn't look infected. No clinically significant withdrawal now. Continue promoting cessation efforts.    Essential hypertension Chronic, hypertensive tonight but in setting of acute illness. Resume outpatient medications but holding metoprolol . -diltiazem  120 mg po daily -losartan  50 mg daily    PAF (paroxysmal atrial fibrillation) (HCC) Chronic, stable. Sinus rhythm at present. Resume outpatient diltiazem  and DOAC, holding metoprolol  per above. -eliquis  5 mg bid -diltiazem  per above    Chest pain Resolved at time of my assessment. EKG stable. Non-cardiac. Treat underlying COPD exacerbation.  Level of care: med-surg Diet: regular VTE: therapeutic DOAC Code: full but wouldn't want to be on ventilator indefinitely  Signed: [name] MD 02/28/2024, 1:43 AM Pager: [phone]

## 2024-02-28 NOTE — H&P (Incomplete)
 Date: 02/28/2024               Patient Name:  Francisco [name] MRN: [id number]  DOB: 09-Jun-1960 Age / Sex: 63 y.o., male   PCP: Heddy Barren, DO         Medical Service: Internal Medicine Teaching Service         Attending Physician: Dr. MICAEL Riis Winfrey      First Contact: Rebecka Pion, DO}    Second Contact: Dr. Drue Grow, MD         Pager Information: First Contact Pager: [phone]   Second Contact Pager: [phone]   SUBJECTIVE   Chief Complaint: Respiratory Distress  History of Present Illness: Francisco [name] is a 63 y.o. male with PMH of chronic obstructive pulmonary disease, tobacco use disorder, coronary artery disease, congestive heart failure, hypertension, atrial fibrillation, previous myocardial infarction, previous stroke, intravenous drug use, and hepatitis C infection who presents with respiratory distress.   The patient reports that on 02/27/2024 between the hours of 6 PM and 7 PM, he was at home watching television, when he suddenly could not catch his breath. The patient reports that he noticed that over the past couple of weeks that his dyspnea has been worsening at rest, even when he is wearing his oxygen . He reports that his baseline home use of oxygen  is about 2 L. He reports that nothing improves his shortness of breath, but almost any sort of slight movement such as going to the bathroom or getting up to cook worsens his symptoms.   He endorses anginal chest pain specifically stating that it feels like someone is sitting on his chest. He also endorses diaphoresis, palpitations, a productive cough with clear and tan like sputum. Additionally, he endorses random spells of uncontrollable jerks, which he says started around the time he began his prednisone  taper. He denies any unintentional weight loss, any known sick contacts, no headaches, no visual disturbances, no leg swelling, no fevers, and no pleuritic chest pain.   *** ED Course: Labs significant for  *** Imaging *** Received *** Consulted ***  Meds:  Patient reported: ***  Current Meds  Medication Sig   albuterol  (VENTOLIN  HFA) 108 (90 Base) MCG/ACT inhaler Inhale 1-2 puffs into the lungs every 6 (six) hours as needed for wheezing or shortness of breath.   Albuterol -Budesonide  90-80 MCG/ACT AERO Inhale 2 puffs into the lungs every 4 (four) hours as needed (wheeze, shortness of breath).   apixaban  (ELIQUIS ) 5 MG TABS tablet Take 1 tablet (5 mg total) by mouth 2 (two) times daily.   atorvastatin  (LIPITOR ) 40 MG tablet Take 1 tablet (40 mg total) by mouth daily.   benzonatate  (TESSALON ) 200 MG capsule Take 1 capsule (200 mg total) by mouth 3 (three) times daily as needed for cough.   buPROPion (WELLBUTRIN SR) 150 MG 12 hr tablet Take 1 tablet (150 mg total) by mouth 2 (two) times daily. Take 1 tablet once daily for the first 3 days (on 10/15-10/17), then take one tablet twice daily (10/18 and onward)   diltiazem  (CARDIZEM  CD) 120 MG 24 hr capsule Take 1 capsule (120 mg total) by mouth daily.   Dupilumab  (DUPIXENT ) 300 MG/2ML SOAJ Inject 300 mg into the skin every 14 (fourteen) days.   fluticasone  (FLONASE ) 50 MCG/ACT nasal spray Place 1 spray into both nostrils daily.   Fluticasone -Umeclidin-Vilant (TRELEGY ELLIPTA ) 100-62.5-25 MCG/ACT AEPB Inhale 1 puff into the lungs daily.   guaiFENesin -dextromethorphan  (ROBITUSSIN DM) 100-10 MG/5ML syrup Take 15 mLs by  mouth 4 (four) times daily.   ipratropium-albuterol  (DUONEB) 0.5-2.5 (3) MG/3ML SOLN Take 3 mLs by nebulization every 6 (six) hours as needed.   loratadine  (CLARITIN ) 10 MG tablet Take 1 tablet (10 mg total) by mouth daily.   losartan  (COZAAR ) 50 MG tablet Take 1 tablet (50 mg total) by mouth daily.   metoprolol  tartrate (LOPRESSOR ) 25 MG tablet Take 1 tablet (25 mg total) by mouth 2 (two) times daily.   nitroGLYCERIN  (NITROSTAT ) 0.4 MG SL tablet Place 1 tablet (0.4 mg total) under the tongue every 5 (five) minutes x 3 doses as needed for  chest pain.   OXYGEN  Inhale 2 L/min into the lungs continuous.   predniSONE  (DELTASONE ) 10 MG tablet Take 4 tablets by mouth daily with breakfast for 2 days, THEN 3 tablets daily with breakfast for 7 days, THEN 2 tablets daily with breakfast for 7 days, THEN 1 tablet daily with breakfast for 7 days, THEN 1/2 tablet daily with breakfast for 7 days.   thiamine  (VITAMIN B-1) 100 MG tablet Take 1 tablet (100 mg total) by mouth daily.    Past Medical History ***  Past Surgical History Past Surgical History:  Procedure Laterality Date   BACK SURGERY     NECK SURGERY     RIGHT/LEFT HEART CATH AND CORONARY ANGIOGRAPHY N/A 04/30/2019   Procedure: RIGHT/LEFT HEART CATH AND CORONARY ANGIOGRAPHY;  Surgeon: Anner Alm ORN, MD;  Location: [facility] INVASIVE CV LAB;  Service: Cardiovascular;  Laterality: N/A;     Social:  Lives With: Roommate Occupation: Retired Support: Self-supported Level of Function: Independent ADLs and IDLs PCP: Education Officer, Community, [name], DO  Substances: -Tobacco: Yes -Alcohol: No -Recreational Drug: Weekly IV Methamphetamine use.   Family History: *** Family History  Problem Relation Age of Onset   Rheum arthritis Mother    CVA Father    Heart disease Paternal Grandfather      Allergies: Allergies as of 02/27/2024 - Review Complete 02/27/2024  Allergen Reaction Noted   Ivp dye [iodinated contrast media] Other (See Comments) 06/12/2018   Tramadol Other (See Comments) 06/12/2018   Doxycycline  Other (See Comments) 02/01/2023    Review of Systems: A complete ROS was negative except as per HPI.   OBJECTIVE:   Physical Exam: Blood pressure 137/84, pulse (!) 105, temperature 98 F (36.7 C), temperature source Oral, resp. rate 19, height 6' (1.829 m), weight 90.7 kg, SpO2 97%.  Constitutional: well-appearing *** sitting in ***, in no acute distress HENT: normocephalic atraumatic, mucous membranes moist Eyes: conjunctiva non-erythematous Neck: supple Cardiovascular:  regular rate and rhythm, no m/r/g Pulmonary/Chest: normal work of breathing on room air, lungs clear to auscultation bilaterally Abdominal: soft, non-tender, non-distended MSK: normal bulk and tone Neurological: alert & oriented x 3, 5/5 strength in bilateral upper and lower extremities, normal gait Skin: warm and dry Psych: ***  Labs: CBC    Component Value Date/Time   WBC 12.0 (H) 02/27/2024 2144   RBC 4.37 02/27/2024 2144   HGB 13.9 02/27/2024 2200   HGB 13.9 02/27/2024 2200   HGB 15.5 12/06/2023 1528   HCT 41.0 02/27/2024 2200   HCT 41.0 02/27/2024 2200   HCT 47.1 12/06/2023 1528   PLT 197 02/27/2024 2144   PLT 285 12/06/2023 1528   MCV 97.3 02/27/2024 2144   MCV 97 12/06/2023 1528   MCH 31.1 02/27/2024 2144   MCHC 32.0 02/27/2024 2144   RDW 13.2 02/27/2024 2144   RDW 13.6 12/06/2023 1528   LYMPHSABS 1.9 02/27/2024 2144   LYMPHSABS  1.7 05/18/2021 1518   MONOABS 0.9 02/27/2024 2144   EOSABS 0.0 02/27/2024 2144   EOSABS 0.0 05/18/2021 1518   BASOSABS 0.0 02/27/2024 2144   BASOSABS 0.0 05/18/2021 1518     CMP     Component Value Date/Time   NA 142 02/27/2024 2200   NA 143 02/27/2024 2200   NA 144 12/06/2023 1528   K 3.8 02/27/2024 2200   K 3.8 02/27/2024 2200   CL 101 02/27/2024 2200   CO2 31 02/27/2024 2144   GLUCOSE 136 (H) 02/27/2024 2200   BUN 17 02/27/2024 2200   BUN 9 12/06/2023 1528   CREATININE 1.10 02/27/2024 2200   CALCIUM  9.0 02/27/2024 2144   PROT 6.3 (L) 02/26/2024 0938   PROT 6.9 12/06/2023 1528   ALBUMIN  3.5 02/26/2024 0938   ALBUMIN  4.1 12/06/2023 1528   AST 29 02/26/2024 0938   ALT 46 (H) 02/26/2024 0938   ALKPHOS 61 02/26/2024 0938   BILITOT 1.2 02/26/2024 0938   BILITOT 0.5 12/06/2023 1528   GFRNONAA >60 02/27/2024 2144   GFRAA >60 01/11/2020 0422    Imaging: *** DG Chest Portable 1 View Result Date: 02/27/2024 CLINICAL DATA:  Shortness of breath EXAM: PORTABLE CHEST 1 VIEW COMPARISON:  02/26/2024, CT 11/28/2023 FINDINGS: The  heart size and mediastinal contours are within normal limits. Both lungs are clear. The visualized skeletal structures are unremarkable. IMPRESSION: No active disease. Electronically Signed   By: Luke Bun M.D.   On: 02/27/2024 22:15   DG Chest Port 1 View Result Date: 02/26/2024 CLINICAL DATA:  Shortness of breath, respiratory distress EXAM: PORTABLE CHEST 1 VIEW COMPARISON:  02/15/2024 FINDINGS: The heart size and mediastinal contours are within normal limits. Mild hyperinflation noted. Both lungs are clear. The visualized skeletal structures are unremarkable. IMPRESSION: Mild hyperinflation. No acute process. Electronically Signed   By: CHRISTELLA.  Shick M.D.   On: 02/26/2024 08:16     EKG: personally reviewed my interpretation is***. Prior EKG***  ASSESSMENT & PLAN:   Assessment & Plan by Problem: Active Problems:   Dilated cardiomyopathy (HCC)   CAD S/P percutaneous coronary angioplasty   Methamphetamine use   Essential hypertension   PAF (paroxysmal atrial fibrillation) (HCC)   Acute hypoxic respiratory failure (HCC)   COPD with acute exacerbation (HCC)   Chest pain   Lonny Eisen is a 63 y.o. person living with a history of *** who presented with *** and admitted for *** on hospital day 0  *** ***  *** ***  *** ***  Best practice: Diet: {NAMES:3044014::Normal,Heart Healthy,Carb-Modified,Renal,Carb/Renal,NPO,TPN,Tube Feeds} VTE: {NAMES:3044014::Heparin ,Enoxaparin ,SCDs,DOAC,None} IVF: {NAMES:3044014::None,NS,1/2 NS,LR,D5,D10},{NAMES:3044014::None,10cc/hr,25cc/hr,50cc/hr,75cc/hr,100cc/hr,110cc/hr,125cc/hr,Bolus} Code: {NAMES:3044014::Full,DNR,DNI,DNR/DNI,Comfort Care,Unknown}  Disposition planning: Prior to Admission Living Arrangement: Home, living in [place], [place]. Anticipated Discharge Location: Home  Dispo: Admit patient to Observation with expected length of stay less than 2  midnights.  Signed: Merilee Dwane RAMAN, Medical Student Internal Medicine Resident  02/28/2024, 12:48 AM  On Call pager: [phone]

## 2024-02-29 ENCOUNTER — Ambulatory Visit: Attending: Student in an Organized Health Care Education/Training Program | Admitting: Cardiology

## 2024-02-29 NOTE — Progress Notes (Deleted)
 Cardiology Office Note:  .   Date:  02/29/2024  ID:  Francisco [name], DOB [birth year], MRN [id number] PCP: Heddy Barren, DO  [facility] Providers Cardiologist:  Newman Lawrence, MD PCP: Heddy Barren, DO  No chief complaint on file.    Francisco [name] is a 63 y.o. male with hypertension, hyperlipidemia CAD, PAF, moderate carotid artery disease COPD, nicotine  dependence  Discussed the use of AI scribe software for clinical note transcription with the patient, who gave verbal consent to proceed.  History of Present Illness Francisco [name] is a 63 year old male with COPD and coronary artery disease who presents with shortness of breath.  He experiences severe shortness of breath, especially after minimal exertion like walking. He attributes this primarily to his COPD. He does not use home oxygen . He recently started Dupixent , with the next dose scheduled soon. He has reduced smoking from two packs to half a pack per day and uses nicotine  patches.  His cardiac history includes three stents, with the last catheterization in 2021 showing some narrowing but not severe. He had a heart attack during one stent placement. No current chest pain, but he recalls past heaviness. His heart function was previously reduced to 25-30% but has since normalized.  Patient has had recurrent hospitalizations with acute hypoxic respiratory failure and COPD exacerbation in the last few months.   Most recent event monitor did not show any A-fib.  There was documented A-fib on EKG in 07/2023, details below.  Family history includes significant cardiac events, with multiple relatives dying of heart attacks before age 52 and his mother dying of congestive heart failure at 44.      There were no vitals filed for this visit.     Review of Systems  Cardiovascular:  Positive for dyspnea on exertion. Negative for chest pain, leg swelling, palpitations and syncope.        Studies Reviewed: SABRA        EKG 11/23/2023: Sinus rhythm with Premature atrial complexes Nonspecific ST and T wave abnormality When compared with ECG of 30-Oct-2023 02:59,  PAC's new    EKG 08/12/2023: Atrial fibrillation 85 bpm  Echocardiogram 07/2023: 1. Left ventricular ejection fraction, by estimation, is 60 to 65%. The  left ventricle has normal function. The left ventricle has no regional  wall motion abnormalities. There is mild left ventricular hypertrophy.  Left ventricular diastolic parameters are indeterminate.   2. Right ventricular systolic function is normal. The right ventricular  size is normal. There is normal pulmonary artery systolic pressure. The  estimated right ventricular systolic pressure is 14.6 mmHg.   3. The mitral valve is normal in structure. No evidence of mitral valve  regurgitation. No evidence of mitral stenosis.   4. The aortic valve is tricuspid. There is mild calcification of the  aortic valve. Aortic valve regurgitation is not visualized. Aortic valve  sclerosis is present, with no evidence of aortic valve stenosis.   5. The inferior vena cava is dilated in size with >50% respiratory  variability, suggesting right atrial pressure of 8 mmHg.   Long term monitor 09/2023: 30 nonsustained SVT (longest 14 beats) No atrial fibrillation detected. Rare supraventricular ectopy. Rare ventricular ectopy. No sustained arrhythmias. Symptom trigger episodes correspond to sinus rhythm, sinus tachycardia   Carotid US  01/2023: Right Carotid: Cannot rule out CCA occlusion, retrograde flow of the ECA  into the ICA, and occlusion of the ICA.  Left Carotid: At least 40-59% ICA stenosis. Higher velocities may be  obtained  if patient was cooperative.  Vertebrals:  Bilateral vertebral arteries demonstrate antegrade flow.  Subclavians: Not assessed.   Coronary angiogram 2021:  Prox RCA to Mid RCA lesion is 50% stenosed - In-stent re-stenosis of distal 1/2 of stented segment (by report 3  overlapping stents). The remainder of the stent segment is widely patent. Prox LAD lesion is 40% stenosed with 50% stenosed side branch in 1st Diag. Mid Cx lesion is 25% stenosed. Hemodynamic findings consistent with severe pulmonary hypertension. LV end diastolic pressure is severely elevated.   SUMMARY Moderate in-stent restenosis of the RCA with moderate bifurcation LAD-diagonal disease but no obstructive disease. 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 Have written for 80 mg of Lasix  x1 today and then 40 mg IV twice daily starting tomorrow. If urine output does not pick up, may want to consider milrinone.  Labs 4-10/2023: HbA1C 5.8% Hb 16.9 Cr 1.0 TSH 0.4  Labs 01/2023: Chol 134, TG 44, HDL 49, LDL 71   Risk Assessment/Calculations:    CHA2DS2-VASc Score = 4   This indicates a 4.8% annual risk of stroke. The patient's score is based upon: CHF History: 0 HTN History: 1 Diabetes History: 0 Stroke History: 2 Vascular Disease History: 1 Age Score: 0 Gender Score: 0     Physical Exam Vitals and nursing note reviewed.  Constitutional:      General: He is not in acute distress. Neck:     Vascular: No JVD.  Cardiovascular:     Rate and Rhythm: Normal rate and regular rhythm.     Heart sounds: Normal heart sounds. No murmur heard. Pulmonary:     Effort: Pulmonary effort is normal.     Breath sounds: Normal breath sounds. No wheezing or rales.  Musculoskeletal:     Right lower leg: No edema.     Left lower leg: No edema.      VISIT DIAGNOSES: No diagnosis found.    Francisco [name] is a 63 y.o. male with hypertension, hyperlipidemia CAD,  PAF, moderate carotid artery disease COPD, nicotine  dependence Assessment & Plan CAD: Known CAD with overlapping stents in RCA.  2021 coronary angiogram showed moderate in-stent restenosis. No clear anginal symptoms at this time, but has severe exertional dyspnea symptoms.  Thus far, these are attributed to COPD.  It would be reasonable to assess his coronary artery disease, but patient is not open to stress testing or coronary angiogram at this time.  He was to see how Dupixent  would help his COPD.  Reasonable to hold off CAD workup at this time. Continue medical management for CAD including metoprolol  tartrate 25 mg twice daily, Lipitor  40 mg daily, diltiazem  120 mg daily. Not on aspirin  due to ongoing use of Eliquis  for A-fib. Blood pressure uncontrolled. Recommend losartan  50 mg daily.  Check BMP in 1 week.  Paroxysmal atrial fibrillation: Documented A-fib on EKG in 07/2023. Continue metoprolol  tartrate 25 mg daily. Continue Eliquis  5 mg twice daily. Losartan  added given paroxysmal A-fib and hypertension.  Hypertension: Management as above.  Moderate carotid artery stenosis: Moderate stenosis noted a year ago, requires monitoring for progression. - Order carotid ultrasound to evaluate current status. - Continue statin.  Nicotine  dependence: Tobacco cessation counseling:  - Currently smoking 1/2 pack/day, down  from 2 pack/day in the past. - Patient was informed of the dangers of tobacco abuse including stroke, cancer, and MI, as well as benefits of tobacco cessation. - Patient is willing to quit at this time. - Approximately 4 mins were spent counseling patient cessation techniques. We discussed various methods to help quit smoking, including deciding on a date to quit, joining a support group, pharmacological agents. Patient would like to continue using nicotine  patch.   - I will reassess his progress at the next follow-up visit   No orders of the defined types were placed in this  encounter.    F/u in 3 months  Signed, Newman JINNY Lawrence, MD

## 2024-03-01 ENCOUNTER — Emergency Department (HOSPITAL_COMMUNITY)
Admission: EM | Admit: 2024-03-01 | Discharge: 2024-03-01 | Discharge status: Against Medical Advice | Source: Home / Self Care | Attending: Emergency Medicine | Admitting: Emergency Medicine

## 2024-03-01 ENCOUNTER — Other Ambulatory Visit: Payer: Self-pay

## 2024-03-01 ENCOUNTER — Emergency Department (HOSPITAL_COMMUNITY)

## 2024-03-01 ENCOUNTER — Ambulatory Visit: Payer: Self-pay | Admitting: Pulmonary Disease

## 2024-03-01 ENCOUNTER — Encounter: Payer: Self-pay | Admitting: Pulmonary Disease

## 2024-03-01 ENCOUNTER — Observation Stay (HOSPITAL_COMMUNITY)
Admission: EM | Admit: 2024-03-01 | Discharge: 2024-03-02 | Disposition: A | Discharge status: Home / Self Care | Attending: Internal Medicine | Admitting: Internal Medicine

## 2024-03-01 ENCOUNTER — Ambulatory Visit: Admitting: Pulmonary Disease

## 2024-03-01 DIAGNOSIS — E785 Hyperlipidemia, unspecified: Secondary | ICD-10-CM | POA: Diagnosis not present

## 2024-03-01 DIAGNOSIS — I2510 Atherosclerotic heart disease of native coronary artery without angina pectoris: Secondary | ICD-10-CM | POA: Diagnosis not present

## 2024-03-01 DIAGNOSIS — Z7901 Long term (current) use of anticoagulants: Secondary | ICD-10-CM | POA: Insufficient documentation

## 2024-03-01 DIAGNOSIS — F17210 Nicotine dependence, cigarettes, uncomplicated: Secondary | ICD-10-CM | POA: Diagnosis not present

## 2024-03-01 DIAGNOSIS — Z8673 Personal history of transient ischemic attack (TIA), and cerebral infarction without residual deficits: Secondary | ICD-10-CM | POA: Insufficient documentation

## 2024-03-01 DIAGNOSIS — I480 Paroxysmal atrial fibrillation: Secondary | ICD-10-CM | POA: Insufficient documentation

## 2024-03-01 DIAGNOSIS — Z79899 Other long term (current) drug therapy: Secondary | ICD-10-CM | POA: Diagnosis not present

## 2024-03-01 DIAGNOSIS — J9602 Acute respiratory failure with hypercapnia: Secondary | ICD-10-CM | POA: Insufficient documentation

## 2024-03-01 DIAGNOSIS — F17200 Nicotine dependence, unspecified, uncomplicated: Secondary | ICD-10-CM | POA: Insufficient documentation

## 2024-03-01 DIAGNOSIS — I110 Hypertensive heart disease with heart failure: Secondary | ICD-10-CM | POA: Insufficient documentation

## 2024-03-01 DIAGNOSIS — Z5329 Procedure and treatment not carried out because of patient's decision for other reasons: Secondary | ICD-10-CM | POA: Insufficient documentation

## 2024-03-01 DIAGNOSIS — Z7951 Long term (current) use of inhaled steroids: Secondary | ICD-10-CM | POA: Insufficient documentation

## 2024-03-01 DIAGNOSIS — J9601 Acute respiratory failure with hypoxia: Secondary | ICD-10-CM | POA: Diagnosis not present

## 2024-03-01 DIAGNOSIS — J441 Chronic obstructive pulmonary disease with (acute) exacerbation: Secondary | ICD-10-CM | POA: Diagnosis not present

## 2024-03-01 DIAGNOSIS — F1510 Other stimulant abuse, uncomplicated: Secondary | ICD-10-CM | POA: Diagnosis not present

## 2024-03-01 DIAGNOSIS — I10 Essential (primary) hypertension: Secondary | ICD-10-CM | POA: Diagnosis not present

## 2024-03-01 DIAGNOSIS — J9622 Acute and chronic respiratory failure with hypercapnia: Secondary | ICD-10-CM | POA: Diagnosis not present

## 2024-03-01 DIAGNOSIS — R0602 Shortness of breath: Secondary | ICD-10-CM | POA: Diagnosis present

## 2024-03-01 DIAGNOSIS — I509 Heart failure, unspecified: Secondary | ICD-10-CM | POA: Insufficient documentation

## 2024-03-01 DIAGNOSIS — I420 Dilated cardiomyopathy: Secondary | ICD-10-CM | POA: Diagnosis not present

## 2024-03-01 DIAGNOSIS — J9621 Acute and chronic respiratory failure with hypoxia: Secondary | ICD-10-CM

## 2024-03-01 LAB — CBC WITH DIFFERENTIAL/PLATELET
Abs Immature Granulocytes: 0.12 K/uL — ABNORMAL HIGH (ref 0.00–0.07)
Abs Immature Granulocytes: 0.14 K/uL — ABNORMAL HIGH (ref 0.00–0.07)
Basophils Absolute: 0.1 K/uL (ref 0.0–0.1)
Basophils Absolute: 0 K/uL (ref 0.0–0.1)
Basophils Relative: 0 %
Basophils Relative: 0 %
Eosinophils Absolute: 0 K/uL (ref 0.0–0.5)
Eosinophils Absolute: 0 K/uL (ref 0.0–0.5)
Eosinophils Relative: 0 %
Eosinophils Relative: 0 %
HCT: 44 % (ref 39.0–52.0)
HCT: 42.9 % (ref 39.0–52.0)
Hemoglobin: 14.2 g/dL (ref 13.0–17.0)
Hemoglobin: 13.9 g/dL (ref 13.0–17.0)
Immature Granulocytes: 1 %
Immature Granulocytes: 1 %
Lymphocytes Relative: 24 %
Lymphocytes Relative: 5 %
Lymphs Abs: 3.5 K/uL (ref 0.7–4.0)
Lymphs Abs: 0.5 K/uL — ABNORMAL LOW (ref 0.7–4.0)
MCH: 32 pg (ref 26.0–34.0)
MCH: 31.4 pg (ref 26.0–34.0)
MCHC: 32.3 g/dL (ref 30.0–36.0)
MCHC: 32.4 g/dL (ref 30.0–36.0)
MCV: 99.1 fL (ref 80.0–100.0)
MCV: 96.8 fL (ref 80.0–100.0)
Monocytes Absolute: 1.1 K/uL — ABNORMAL HIGH (ref 0.1–1.0)
Monocytes Absolute: 0.3 K/uL (ref 0.1–1.0)
Monocytes Relative: 8 %
Monocytes Relative: 3 %
Neutro Abs: 9.5 K/uL — ABNORMAL HIGH (ref 1.7–7.7)
Neutro Abs: 9.8 K/uL — ABNORMAL HIGH (ref 1.7–7.7)
Neutrophils Relative %: 67 %
Neutrophils Relative %: 91 %
Platelets: 193 K/uL (ref 150–400)
Platelets: 222 K/uL (ref 150–400)
RBC: 4.44 MIL/uL (ref 4.22–5.81)
RBC: 4.43 MIL/uL (ref 4.22–5.81)
RDW: 13.4 % (ref 11.5–15.5)
RDW: 13.3 % (ref 11.5–15.5)
WBC: 14.3 K/uL — ABNORMAL HIGH (ref 4.0–10.5)
WBC: 10.8 K/uL — ABNORMAL HIGH (ref 4.0–10.5)
nRBC: 0 % (ref 0.0–0.2)
nRBC: 0 % (ref 0.0–0.2)

## 2024-03-01 LAB — I-STAT ARTERIAL BLOOD GAS, ED
Acid-Base Excess: 4 mmol/L — ABNORMAL HIGH (ref 0.0–2.0)
Bicarbonate: 31.7 mmol/L — ABNORMAL HIGH (ref 20.0–28.0)
Calcium, Ion: 1.24 mmol/L (ref 1.15–1.40)
HCT: 41 % (ref 39.0–52.0)
Hemoglobin: 13.9 g/dL (ref 13.0–17.0)
O2 Saturation: 99 %
Patient temperature: 97.4
Potassium: 4.1 mmol/L (ref 3.5–5.1)
Sodium: 139 mmol/L (ref 135–145)
TCO2: 34 mmol/L — ABNORMAL HIGH (ref 22–32)
pCO2 arterial: 59.2 mmHg — ABNORMAL HIGH (ref 32–48)
pH, Arterial: 7.333 — ABNORMAL LOW (ref 7.35–7.45)
pO2, Arterial: 158 mmHg — ABNORMAL HIGH (ref 83–108)

## 2024-03-01 LAB — BASIC METABOLIC PANEL WITH GFR
Anion gap: 9 (ref 5–15)
Anion gap: 14 (ref 5–15)
BUN: 22 mg/dL (ref 8–23)
BUN: 27 mg/dL — ABNORMAL HIGH (ref 8–23)
CO2: 29 mmol/L (ref 22–32)
CO2: 29 mmol/L (ref 22–32)
Calcium: 8.7 mg/dL — ABNORMAL LOW (ref 8.9–10.3)
Calcium: 9 mg/dL (ref 8.9–10.3)
Chloride: 100 mmol/L (ref 98–111)
Chloride: 100 mmol/L (ref 98–111)
Creatinine, Ser: 1.21 mg/dL (ref 0.61–1.24)
Creatinine, Ser: 1.33 mg/dL — ABNORMAL HIGH (ref 0.61–1.24)
GFR, Estimated: >60 mL/min (ref >60)
GFR, Estimated: >60 mL/min (ref >60)
Glucose, Bld: 90 mg/dL (ref 70–99)
Glucose, Bld: 268 mg/dL — ABNORMAL HIGH (ref 70–99)
Potassium: 4 mmol/L (ref 3.5–5.1)
Potassium: 4.4 mmol/L (ref 3.5–5.1)
Sodium: 138 mmol/L (ref 135–145)
Sodium: 143 mmol/L (ref 135–145)

## 2024-03-01 LAB — D-DIMER, QUANTITATIVE: D-Dimer, Quant: <0.27 ug{FEU}/mL (ref 0.00–0.50)

## 2024-03-01 MED ORDER — LORAZEPAM 2 MG/ML IJ SOLN
0.5000 mg | Freq: Once | INTRAMUSCULAR | Status: AC
  Administered 2024-03-01: 0.5 mg via INTRAVENOUS
  Filled 2024-03-01: qty 1

## 2024-03-01 MED ORDER — ALBUTEROL SULFATE (2.5 MG/3ML) 0.083% IN NEBU
10.0000 mg | INHALATION_SOLUTION | Freq: Once | RESPIRATORY_TRACT | Status: AC
  Administered 2024-03-01: 10 mg via RESPIRATORY_TRACT
  Filled 2024-03-01: qty 12

## 2024-03-01 NOTE — ED Triage Notes (Signed)
 Pt here after leaving this morning refusing to wear bipap and get admitted. Did duoneb and atrovent  at home prior to ems arrival.  Ems gave albuterol  10 mg Atrovent  1 mg  Solumedrol 125mg  Mag 2mg 

## 2024-03-01 NOTE — ED Provider Notes (Signed)
 [place] EMERGENCY DEPARTMENT AT [facility] Provider Note   CSN: [id number] Arrival date & time: 03/01/24  9365     Patient presents with: Shortness of Breath (Woke up around 4am sob hx of COPD)   Francisco [name] is a 63 y.o. male.   Francisco [name] is a 63 y.o. male with a history of COPD, hypertension, MI with previous PCI, CHF, a flutter, who presents to the emergency department via EMS for evaluation of respiratory distress.  Patient has been having shortness of breath for the last 4 to 5 days and has been seen in the ED previously for COPD exacerbation.  Woke up at 4 AM with worsening shortness of breath, took 2 DuoNebs at home, when EMS arrived sats were in the mid 80s patient with diffuse wheezing and decreased air movement.  EMS gave 2 additional DuoNebs as well as 125 Solu-Medrol  and 2 g of mag.  Patient reports slight improvement but still having a hard time breathing.  He denies productive cough or fever.  No recent sick contacts.  He denies associated chest pain, no leg swelling.  He does report that he continues to smoke about 1/2 pack/day.  Typically wears 2 L nasal cannula at home.  The history is provided by the patient and medical records.  Shortness of Breath Associated symptoms: wheezing   Associated symptoms: no abdominal pain, no chest pain, no cough, no fever and no vomiting        Prior to Admission medications   Medication Sig Start Date End Date Taking? Authorizing Provider  albuterol  (VENTOLIN  HFA) 108 (90 Base) MCG/ACT inhaler Inhale 1-2 puffs into the lungs every 6 (six) hours as needed for wheezing or shortness of breath. 02/15/24   Jerral Meth, MD  Albuterol -Budesonide  90-80 MCG/ACT AERO Inhale 2 puffs into the lungs every 4 (four) hours as needed (wheeze, shortness of breath). 01/23/24   Hunsucker, [name], MD  apixaban  (ELIQUIS ) 5 MG TABS tablet Take 1 tablet (5 mg total) by mouth 2 (two) times daily. 02/01/24   Marylu Gee, DO  atorvastatin   (LIPITOR ) 40 MG tablet Take 1 tablet (40 mg total) by mouth daily. 02/01/24   Marylu Gee, DO  benzonatate  (TESSALON ) 200 MG capsule Take 1 capsule (200 mg total) by mouth 3 (three) times daily as needed for cough. 02/01/24   Amilibia, Jaden, DO  buPROPion (WELLBUTRIN SR) 150 MG 12 hr tablet Take 1 tablet (150 mg total) by mouth 2 (two) times daily. Take 1 tablet once daily for the first 3 days (on 10/15-10/17), then take one tablet twice daily (10/18 and onward) 02/01/24   Amilibia, Jaden, DO  diltiazem  (CARDIZEM  CD) 120 MG 24 hr capsule Take 1 capsule (120 mg total) by mouth daily. 02/01/24   Marylu Gee, DO  Dupilumab  (DUPIXENT ) 300 MG/2ML SOAJ Inject 300 mg into the skin every 14 (fourteen) days. 01/26/24   Hunsucker, [name], MD  fluticasone  (FLONASE ) 50 MCG/ACT nasal spray Place 1 spray into both nostrils daily. 01/25/24   [name], [name], DO  Fluticasone -Umeclidin-Vilant (TRELEGY ELLIPTA ) 100-62.5-25 MCG/ACT AEPB Inhale 1 puff into the lungs daily. 11/21/23 03/05/24  Marylu Gee, DO  guaiFENesin -dextromethorphan  (ROBITUSSIN DM) 100-10 MG/5ML syrup Take 15 mLs by mouth 4 (four) times daily. 02/01/24   Amilibia, Jaden, DO  ipratropium-albuterol  (DUONEB) 0.5-2.5 (3) MG/3ML SOLN Take 3 mLs by nebulization every 4 (four) hours as needed. 02/28/24   Renne Homans, MD  loratadine  (CLARITIN ) 10 MG tablet Take 1 tablet (10 mg total) by mouth  daily. 11/28/23 11/27/24  [name], Toma, DO  losartan  (COZAAR ) 50 MG tablet Take 1 tablet (50 mg total) by mouth daily. 02/01/24   Marylu Gee, DO  metoprolol  tartrate (LOPRESSOR ) 25 MG tablet Take 1 tablet (25 mg total) by mouth 2 (two) times daily. 02/01/24   Marylu Gee, DO  nicotine  (NICODERM CQ  - DOSED IN MG/24 HOURS) 21 mg/24hr patch Place 1 patch (21 mg total) onto the skin daily. 02/28/24 02/27/25  Amoako, Prince, MD  nitroGLYCERIN  (NITROSTAT ) 0.4 MG SL tablet Place 1 tablet (0.4 mg total) under the tongue every 5 (five) minutes x 3 doses as needed  for chest pain. 11/17/23   [name], [name], DO  OXYGEN  Inhale 2 L/min into the lungs continuous.    [provider]  predniSONE  (DELTASONE ) 50 MG tablet Take 1 tablet (50 mg total) by mouth daily with breakfast. 02/29/24   Renne Homans, MD  thiamine  (VITAMIN B-1) 100 MG tablet Take 1 tablet (100 mg total) by mouth daily. 08/15/23   Krishnan, Gokul, MD    Allergies: Ivp dye [iodinated contrast media], Tramadol, and Doxycycline     Review of Systems  Constitutional:  Negative for chills and fever.  Respiratory:  Positive for shortness of breath and wheezing. Negative for cough.   Cardiovascular:  Negative for chest pain, palpitations and leg swelling.  Gastrointestinal:  Negative for abdominal pain, nausea and vomiting.    Updated Vital Signs BP (!) 173/120 (BP Location: Right Arm)   Pulse 98   Temp (!) 97.4 F (36.3 C) (Oral)   Resp (!) 25   SpO2 100%   Physical Exam Vitals and nursing note reviewed.  Constitutional:      General: He is in acute distress.     Appearance: Normal appearance. He is well-developed. He is not diaphoretic.  HENT:     Head: Normocephalic and atraumatic.  Eyes:     General:        Right eye: No discharge.        Left eye: No discharge.     Pupils: Pupils are equal, round, and reactive to light.  Cardiovascular:     Rate and Rhythm: Normal rate and regular rhythm.     Pulses: Normal pulses.     Heart sounds: Normal heart sounds.  Pulmonary:     Effort: Tachypnea, accessory muscle usage and respiratory distress present.     Breath sounds: Decreased breath sounds and wheezing present. No rhonchi or rales.     Comments: Patient in acute respiratory distress tachypneic with increased effort and accessory muscle use, currently on nebulizer mask, able to speak only in short sentences.  On exam diffuse expiratory wheezing with decreased air movement throughout, no crackles or rhonchi noted. Chest:     Chest wall: No tenderness.  Abdominal:      General: Bowel sounds are normal. There is no distension.     Palpations: Abdomen is soft. There is no mass.     Tenderness: There is no abdominal tenderness. There is no guarding.     Comments: Abdomen soft, nondistended, nontender to palpation in all quadrants without guarding or peritoneal signs  Musculoskeletal:        General: No deformity.     Cervical back: Neck supple.     Right lower leg: No tenderness. No edema.     Left lower leg: No tenderness. No edema.  Skin:    General: Skin is warm and dry.     Capillary Refill: Capillary refill takes less than  2 seconds.  Neurological:     Mental Status: He is alert and oriented to person, place, and time.     Coordination: Coordination normal.     Comments: Speech is clear, able to follow commands Moves extremities without ataxia, coordination intact  Psychiatric:        Mood and Affect: Mood normal.        Behavior: Behavior normal.     (all labs ordered are listed, but only abnormal results are displayed) Labs Reviewed  BASIC METABOLIC PANEL WITH GFR - Abnormal; Notable for the following components:      Result Value   Calcium  8.7 (*)    All other components within normal limits  CBC WITH DIFFERENTIAL/PLATELET - Abnormal; Notable for the following components:   WBC 14.3 (*)    Neutro Abs 9.5 (*)    Monocytes Absolute 1.1 (*)    Abs Immature Granulocytes 0.12 (*)    All other components within normal limits  I-STAT ARTERIAL BLOOD GAS, ED - Abnormal; Notable for the following components:   pH, Arterial 7.333 (*)    pCO2 arterial 59.2 (*)    pO2, Arterial 158 (*)    Bicarbonate 31.7 (*)    TCO2 34 (*)    Acid-Base Excess 4.0 (*)    All other components within normal limits    EKG: None  Radiology: DG Chest Port 1 View Result Date: 03/01/2024 EXAM: 1 VIEW(S) XRAY OF THE CHEST 03/01/2024 07:07:04 AM COMPARISON: 02/27/2024 CLINICAL HISTORY: resp distress FINDINGS: LUNGS AND PLEURA: No focal pulmonary opacity. No  pleural effusion. No pneumothorax. HEART AND MEDIASTINUM: No acute abnormality of the cardiac and mediastinal silhouettes. BONES AND SOFT TISSUES: No acute osseous abnormality. IMPRESSION: 1. No acute process. Electronically signed by: Evalene Coho MD 03/01/2024 07:12 AM EST RP Workstation: [id number]     .Critical Care  Performed by: Alva Larraine FALCON, PA-C Authorized by: Alva Larraine FALCON, PA-C   Critical care provider statement:    Critical care was necessary to treat or prevent imminent or life-threatening deterioration of the following conditions:  Respiratory failure   Critical care was time spent personally by me on the following activities:  Development of treatment plan with patient or surrogate, evaluation of patient's response to treatment, examination of patient, obtaining history from patient or surrogate, ordering and performing treatments and interventions, ordering and review of laboratory studies, ordering and review of radiographic studies, pulse oximetry, re-evaluation of patient's condition and review of old charts    Medications Ordered in the ED  LORazepam  (ATIVAN ) injection 0.5 mg (0.5 mg Intravenous Given 03/01/24 0745)  albuterol  (PROVENTIL ) (2.5 MG/3ML) 0.083% nebulizer solution 10 mg (10 mg Nebulization Given 03/01/24 0708)    Clinical Course as of 03/01/24 0752  Thu Mar 01, 2024  0704 bipap [LS]    Clinical Course User Index [LS] Rogelia Jerilynn RAMAN, MD                                 Medical Decision Making Amount and/or Complexity of Data Reviewed Labs: ordered. Radiology: ordered.  Risk Prescription drug management.   63 y.o. male presents to the ED with complaints of shortness of breath, this involves an extensive number of treatment options, and is a complaint that carries with it a high risk of complications and morbidity.  The differential diagnosis includes COPD exacerbation, pneumonia, COVID, flu, pulmonary edema/heart failure, pulmonary  embolism, pneumothorax  On arrival pt  is in acute respiratory distress, with accessory muscle use and increased work of breathing despite 10 L nasal cannula.   Additional history obtained from chart review and EMS personnel. Previous records obtained and reviewed including 2 recent ED visits this week and previous pulmonology notes  Received 2 DuoNebs with EMS as well as Solu-Medrol  and mag.  Will give additional 1 hour continuous nebulizer treatment and have asked respiratory to start patient on BiPAP for work of breathing and hypercapnia, 0.5 mg Ativan  given to help with anxiety  Lab Tests:  I Ordered, reviewed, and interpreted labs, which included: ABG shows pCO2 of 59.2, fortunately pH of 7.33 suggesting some degree of compensation, leukocytosis of 14.3 but patient has been on steroids throughout the week, no anemia, no significant electrolyte derangements and normal renal function, D-dimer is negative  Imaging Studies ordered:  I ordered imaging studies which included chest x-ray, I independently visualized and interpreted imaging which showed no evidence of pneumonia, pulmonary edema or other acute cardiopulmonary disease.  ED Course:   [name] work of breathing has significantly improved on BiPAP, some increase in air movement but overall still tight with some expiratory wheezing noted.  Patient taken off BiPAP and able to maintain O2 sats on baseline 2 L nasal cannula.  Given severity of respiratory distress on arrival and that this is patient's third ED presentation this week for COPD exacerbation I strongly recommended admission for further monitoring and continued use of BiPAP.  I explained to patient that in COPD your lungs can retain CO2 and over time this can lead to confusion and altered mental status and can ultimately cause you to become unresponsive.  Patient is at high risk for becoming increasingly hypercapnic with worsening exacerbation which could ultimately lead to  respiratory failure and death.  I have explained all of these risks to patient he is alert with normal mental status and decision-making capacity.  He expresses understanding of risks and is able to repeat them back to me.  Despite this he reports that he just wants to go home and refuses admission for further treatment and monitoring.  He already has a prescription for prednisone  which I have encouraged him to continue taking and have encouraged him to use his DuoNebs every 4 hours and if he is needing them more frequently he should return to the hospital.  Stressed the importance of close follow-up and have reached out to his pulmonologist Dr. Annella and made him aware of his presentation today.  Patient has signed out AGAINST MEDICAL ADVICE.  Encouraged to return for worsening symptoms.  Portions of this note were generated with Scientist, clinical (histocompatibility and immunogenetics). Dictation errors may occur despite best attempts at proofreading.      Final diagnoses:  Acute on chronic respiratory failure with hypoxia and hypercapnia (HCC)  COPD with acute exacerbation [facility])    ED Discharge Orders     None          Lue [place] F, PA-C 03/01/24 1320    Mesner, Selinda, MD 03/01/24 2341

## 2024-03-01 NOTE — ED Provider Notes (Signed)
 I provided a substantive portion of the care of this patient.  I personally made/approved the management plan for this patient and take responsibility for the patient management.   Recurrent presentation for likely COPD exacerbation. Significant wheezing and decreased breath sounds. Coughing and tachypneic around 30, can't speak in full sentences. No le edema. Plan for xr, basic labs. Previous cxr and labs reviewed from a couple days ago and BNP ok, no edema/effusions on xr, even though he is hypertensive I think this is unlikely to be CHF, would continue albuterol  with bipap and reeval for disposition in 1-2 hours.   CRITICAL CARE Performed by: Selinda Sias Total critical care time: 35 minutes Critical care time was exclusive of separately billable procedures and treating other patients. Critical care was necessary to treat or prevent imminent or life-threatening deterioration. Critical care was time spent personally by me on the following activities: development of treatment plan with patient and/or surrogate as well as nursing, discussions with consultants, evaluation of patient's response to treatment, examination of patient, obtaining history from patient or surrogate, ordering and performing treatments and interventions, ordering and review of laboratory studies, ordering and review of radiographic studies, pulse oximetry and re-evaluation of patient's condition.       Sias Selinda, MD 03/01/24 [phone]

## 2024-03-01 NOTE — ED Provider Notes (Signed)
 [facility] AT [facility] Provider Note   CSN: [id number] Arrival date & time: 03/01/24  2236     Patient presents with: Shortness of Breath (Pt here for 5th day in a row with diminished airways and work of breathing.)   Francisco [name] is a 63 y.o. male.  {Add pertinent medical, surgical, social history, OB history to YEP:67052} The history is provided by the patient and medical records.  Shortness of Breath  63 y.o. M with hx of COPD, coronary artery disease, CHF, hypertension, paroxysmal A-fib on Eliquis , presenting to the ED with shortness of breath.  Patient was here earlier this morning and encouraged to be admitted but he left AMA.  States this evening he started feeling worse again.  He called his pulmonologist's office and they told him to come back to the ER to be admitted.  He reports ongoing cough but no fever/chills.  Denies chest pain.  States he has O2 concentrator that he uses at home, usually 2L when needed.  This evening had it up to 5L and was still struggling.  He received 2 g magnesium , 125 mg Solu-Medrol , and multiple DuoNebs with EMS with some improvement.  Prior to Admission medications   Medication Sig Start Date End Date Taking? Authorizing Provider  albuterol  (VENTOLIN  HFA) 108 (90 Base) MCG/ACT inhaler Inhale 1-2 puffs into the lungs every 6 (six) hours as needed for wheezing or shortness of breath. 02/15/24   Jerral Meth, MD  Albuterol -Budesonide  90-80 MCG/ACT AERO Inhale 2 puffs into the lungs every 4 (four) hours as needed (wheeze, shortness of breath). 01/23/24   Hunsucker, [name], MD  apixaban  (ELIQUIS ) 5 MG TABS tablet Take 1 tablet (5 mg total) by mouth 2 (two) times daily. 02/01/24   Marylu Gee, DO  atorvastatin  (LIPITOR ) 40 MG tablet Take 1 tablet (40 mg total) by mouth daily. 02/01/24   Marylu Gee, DO  benzonatate  (TESSALON ) 200 MG capsule Take 1 capsule (200 mg total) by mouth 3 (three) times daily as needed for  cough. 02/01/24   Amilibia, Jaden, DO  buPROPion (WELLBUTRIN SR) 150 MG 12 hr tablet Take 1 tablet (150 mg total) by mouth 2 (two) times daily. Take 1 tablet once daily for the first 3 days (on 10/15-10/17), then take one tablet twice daily (10/18 and onward) 02/01/24   Amilibia, Jaden, DO  diltiazem  (CARDIZEM  CD) 120 MG 24 hr capsule Take 1 capsule (120 mg total) by mouth daily. 02/01/24   Marylu Gee, DO  Dupilumab  (DUPIXENT ) 300 MG/2ML SOAJ Inject 300 mg into the skin every 14 (fourteen) days. 01/26/24   Hunsucker, [name], MD  fluticasone  (FLONASE ) 50 MCG/ACT nasal spray Place 1 spray into both nostrils daily. 01/25/24   [name], [name], DO  Fluticasone -Umeclidin-Vilant (TRELEGY ELLIPTA ) 100-62.5-25 MCG/ACT AEPB Inhale 1 puff into the lungs daily. 11/21/23 03/05/24  Marylu Gee, DO  guaiFENesin -dextromethorphan  (ROBITUSSIN DM) 100-10 MG/5ML syrup Take 15 mLs by mouth 4 (four) times daily. 02/01/24   Amilibia, Jaden, DO  ipratropium-albuterol  (DUONEB) 0.5-2.5 (3) MG/3ML SOLN Take 3 mLs by nebulization every 4 (four) hours as needed. 02/28/24   Renne Homans, MD  loratadine  (CLARITIN ) 10 MG tablet Take 1 tablet (10 mg total) by mouth daily. 11/28/23 11/27/24  [name], [name], DO  losartan  (COZAAR ) 50 MG tablet Take 1 tablet (50 mg total) by mouth daily. 02/01/24   Marylu Gee, DO  metoprolol  tartrate (LOPRESSOR ) 25 MG tablet Take 1 tablet (25 mg total) by mouth 2 (two) times daily. 02/01/24  Marylu Gee, DO  nicotine  (NICODERM CQ  - DOSED IN MG/24 HOURS) 21 mg/24hr patch Place 1 patch (21 mg total) onto the skin daily. 02/28/24 02/27/25  Renne Homans, MD  nitroGLYCERIN  (NITROSTAT ) 0.4 MG SL tablet Place 1 tablet (0.4 mg total) under the tongue every 5 (five) minutes x 3 doses as needed for chest pain. 11/17/23   [name], [name], DO  OXYGEN  Inhale 2 L/min into the lungs continuous.    [provider]  predniSONE  (DELTASONE ) 50 MG tablet Take 1 tablet (50 mg total) by mouth daily with  breakfast. 02/29/24   Renne Homans, MD  thiamine  (VITAMIN B-1) 100 MG tablet Take 1 tablet (100 mg total) by mouth daily. 08/15/23   Krishnan, Gokul, MD    Allergies: Ivp dye [iodinated contrast media], Tramadol, and Doxycycline     Review of Systems  Respiratory:  Positive for shortness of breath.     Updated Vital Signs BP (!) 165/88   Pulse (!) 108   Temp 98.3 F (36.8 C) (Oral)   Resp (!) 21   SpO2 100%   Physical Exam Vitals and nursing note reviewed.  Constitutional:      Appearance: He is well-developed.  HENT:     Head: Normocephalic and atraumatic.  Eyes:     Conjunctiva/sclera: Conjunctivae normal.     Pupils: Pupils are equal, round, and reactive to light.  Cardiovascular:     Rate and Rhythm: Normal rate and regular rhythm.     Heart sounds: Normal heart sounds.  Pulmonary:     Effort: Pulmonary effort is normal.     Breath sounds: Normal breath sounds. No wheezing or rhonchi.     Comments: Breathing easily, speaking in full sentences without difficulty, removed from O2 and quickly dropped to 92 to 93% and a matter of minutes while sitting Abdominal:     General: Bowel sounds are normal.     Palpations: Abdomen is soft.  Musculoskeletal:        General: Normal range of motion.     Cervical back: Normal range of motion.  Skin:    General: Skin is warm and dry.  Neurological:     Mental Status: He is alert and oriented to person, place, and time.     (all labs ordered are listed, but only abnormal results are displayed) Labs Reviewed  RESP PANEL BY RT-PCR (RSV, FLU A&B, COVID)  [id number]  CBC WITH DIFFERENTIAL/PLATELET  BASIC METABOLIC PANEL WITH GFR    EKG: EKG Interpretation Date/Time:  Thursday March 01 2024 22:40:15 EST Ventricular Rate:  112 PR Interval:  139 QRS Duration:  70 QT Interval:  419 QTC Calculation: 572 R Axis:   54  Text Interpretation: Sinus tachycardia Non-specific ST-t changes Confirmed by Bernard Drivers ([id number]) on  03/01/2024 10:55:43 PM  Radiology: [facility] Chest Port 1 View Result Date: 03/01/2024 EXAM: 1 VIEW(S) XRAY OF THE CHEST 03/01/2024 07:07:04 AM COMPARISON: 02/27/2024 CLINICAL HISTORY: resp distress FINDINGS: LUNGS AND PLEURA: No focal pulmonary opacity. No pleural effusion. No pneumothorax. HEART AND MEDIASTINUM: No acute abnormality of the cardiac and mediastinal silhouettes. BONES AND SOFT TISSUES: No acute osseous abnormality. IMPRESSION: 1. No acute process. Electronically signed by: Evalene Coho MD 03/01/2024 07:12 AM EST RP Workstation: [id number]    {Document cardiac monitor, telemetry assessment procedure when appropriate:32947} Procedures   Medications Ordered in the ED - No data to display    {Click here for ABCD2, HEART and other calculators REFRESH Note before signing:1}  Medical Decision Making Amount and/or Complexity of Data Reviewed Labs: ordered. Radiology: ordered.   ***  {Document critical care time when appropriate  Document review of labs and clinical decision tools ie CHADS2VASC2, etc  Document your independent review of radiology images and any outside records  Document your discussion with family members, caretakers and with consultants  Document social determinants of health affecting pt's care  Document your decision making why or why not admission, treatments were needed:32947:::1}   Final diagnoses:  None    ED Discharge Orders     None

## 2024-03-01 NOTE — Discharge Instructions (Addendum)
 Please continue taking the steroids that were prescribed to you on 11/11.  You should continue doing DuoNebs every 4 hours for the next 24 hours and then as needed, if you are needing nebs more frequently than this or needing to use your rescue inhaler more often this is a sign that you should return to the emergency department.  With COPD your lungs have a hard time getting out carbon dioxide this can ultimately make you sleepy and altered and become unresponsive.  If your symptoms are worsening it is very important that you return to the emergency department immediately.

## 2024-03-01 NOTE — ED Triage Notes (Signed)
 Pt awoke sob in respiratory distress. Took 2 duonebs  at home had sats in mid 95s EMS gave 2 mor duonebs 125 solumedrol 2 grams of mag COPD & CHF

## 2024-03-01 NOTE — Progress Notes (Signed)
 RT started 10mg  CAT per MD order

## 2024-03-01 NOTE — Telephone Encounter (Signed)
 FYI Only or Action Required?: Action required by provider: update on patient condition and left ED AMA.  Patient is followed in Pulmonology for COPD/nodule, last seen on 01/23/2024 by Hunsucker, [name], MD.  Called Nurse Triage reporting Shortness of Breath.    Interventions attempted: Other: see ED note.  Symptoms are: unchanged.  Triage Disposition: Go to ED Now (or PCP Triage), Call Specialist Today  Patient/caregiver understands and will follow disposition?: Unsure         Copied from CRM #[id number]. Topic: Clinical - Red Word Triage >> Mar 01, 2024  5:29 PM Abigail D wrote: Red Word that prompted transfer to Nurse Triage: SOB: Patient is calling due to a missed appt, he has been in and out of the hospital 3x this week already for trouble breathing/SOB which is still present as of this call. He would like to reschedule his appointment. Reason for Disposition  Sounds like a serious complication to the triager    Pt calling to rescheduled missed appt today d/t pt being evaluated in the ED. Upon further investigation, pt left ED AMA. Pt reported that they wanted him to wear that mask and patient refused and left AMA.   It seems like pt was trialled wearing Bipap for a few hours and they took gas from blood and indicated that then numbers got better after being on the mask for a few hours. Pt then reported that ED provider wanted him to stay on the mask for continued improvement, which is when pt refused and left AMA.  Pt then got upset and said am I supposed to go home and die?SABRA Triager strongly advised that pt that there was a reason the ED provider wanted to have him on BiPAP, and that he should listen to the provider's medical advice for evaluation/treatment. Pt then inquired if [facility] made rounds in the hospital like [facility]. Triager confirmed they did and stated that he needed to let the admitting provider know that he is a pt of [facility] so that they can consult the inpatient  team. Pt stated OK, I will go back to the hospital then... I guess.  Triager advised pt to call back for HFU appt once treated and stabilized.  Answer Assessment - Initial Assessment Questions 1. MAIN CONCERN OR SYMPTOM:  What is your main concern right now? What question do you have? What's the main symptom you're worried about? (e.g., breathing difficulty, ankle swelling, weight gain.)     SOB 2. ONSET: When did the  sx  start?     today 3. BETTER-SAME-WORSE: Are you getting better, staying the same, or getting worse compared to the day you were discharged?     better 4. HOSPITALIZATION: How long were you hospitalized? (e.g., days)     Left AMA today 5. DISCHARGE DIAGNOSIS:  What problem or disease were you hospitalized for?     Acute on chronic respiratory failure with hypoxia and hypercapnia 6. DISCHARGE DATE: What date were you discharged from the hospital?     03/01/24 7. DISCHARGE DOCTOR: Who is the main doctor taking care of you now?     See chart 8. DISCHARGE APPOINTMENT: Have you scheduled a follow-up discharge appointment with your doctor?     Not yet 9. DISCHARGE MEDICINES: Did the doctor (or NP/PA) who discharged you order any new medicines for you to use? If yes, have you filled the prescription and started taking the medicine?      Left AMA - no D/C paperwork 10.  PAIN: Is there any pain? If Yes, ask: How bad is it?  (Scale 0-10; or none, mild, moderate, severe)       denies 11. FEVER: Do you have a fever? If Yes, ask: What is it, how was it measured  and when did it start?       denies 12. OTHER SYMPTOMS: Do you have any other symptoms?       Denies  Protocols used: Post-Hospitalization Follow-up Call-A-AH

## 2024-03-01 NOTE — ED Notes (Addendum)
 Pt has signed AMA form with this RN.  Pt aware and notified about risks of leaving up to death.  Pt would like to proceed with leaving.

## 2024-03-01 NOTE — Progress Notes (Signed)
 RT placed patient on BIPAP per MD order. Tolerating well at this time

## 2024-03-02 ENCOUNTER — Emergency Department (HOSPITAL_COMMUNITY)

## 2024-03-02 ENCOUNTER — Encounter (HOSPITAL_COMMUNITY): Payer: Self-pay | Admitting: Internal Medicine

## 2024-03-02 ENCOUNTER — Other Ambulatory Visit (HOSPITAL_COMMUNITY): Payer: Self-pay

## 2024-03-02 ENCOUNTER — Other Ambulatory Visit

## 2024-03-02 DIAGNOSIS — J441 Chronic obstructive pulmonary disease with (acute) exacerbation: Principal | ICD-10-CM

## 2024-03-02 LAB — BASIC METABOLIC PANEL WITH GFR
Anion gap: 10 (ref 5–15)
BUN: 22 mg/dL (ref 8–23)
CO2: 32 mmol/L (ref 22–32)
Calcium: 8.7 mg/dL — ABNORMAL LOW (ref 8.9–10.3)
Chloride: 102 mmol/L (ref 98–111)
Creatinine, Ser: 1.27 mg/dL — ABNORMAL HIGH (ref 0.61–1.24)
GFR, Estimated: >60 mL/min (ref >60)
Glucose, Bld: 191 mg/dL — ABNORMAL HIGH (ref 70–99)
Potassium: 4.1 mmol/L (ref 3.5–5.1)
Sodium: 144 mmol/L (ref 135–145)

## 2024-03-02 LAB — CBC
HCT: 40 % (ref 39.0–52.0)
Hemoglobin: 12.9 g/dL — ABNORMAL LOW (ref 13.0–17.0)
MCH: 31.3 pg (ref 26.0–34.0)
MCHC: 32.3 g/dL (ref 30.0–36.0)
MCV: 97.1 fL (ref 80.0–100.0)
Platelets: 202 K/uL (ref 150–400)
RBC: 4.12 MIL/uL — ABNORMAL LOW (ref 4.22–5.81)
RDW: 13.2 % (ref 11.5–15.5)
WBC: 10.5 K/uL (ref 4.0–10.5)
nRBC: 0 % (ref 0.0–0.2)

## 2024-03-02 LAB — CULTURE, BLOOD (ROUTINE X 2): Culture: NO GROWTH

## 2024-03-02 LAB — RESP PANEL BY RT-PCR (RSV, FLU A&B, COVID)  RVPGX2
Influenza A by PCR: NEGATIVE
Influenza B by PCR: NEGATIVE
Resp Syncytial Virus by PCR: NEGATIVE
SARS Coronavirus 2 by RT PCR: NEGATIVE

## 2024-03-02 MED ORDER — ATORVASTATIN CALCIUM 40 MG PO TABS
40.0000 mg | ORAL_TABLET | Freq: Every day | ORAL | Status: DC
  Administered 2024-03-02: 40 mg via ORAL
  Filled 2024-03-02: qty 1

## 2024-03-02 MED ORDER — AZITHROMYCIN 500 MG PO TABS
500.0000 mg | ORAL_TABLET | Freq: Every day | ORAL | Status: DC
  Administered 2024-03-02: 500 mg via ORAL
  Filled 2024-03-02: qty 2

## 2024-03-02 MED ORDER — BUDESON-GLYCOPYRROL-FORMOTEROL 160-9-4.8 MCG/ACT IN AERO
2.0000 | INHALATION_SPRAY | Freq: Two times a day (BID) | RESPIRATORY_TRACT | Status: DC
  Administered 2024-03-02: 2 via RESPIRATORY_TRACT
  Filled 2024-03-02: qty 5.9

## 2024-03-02 MED ORDER — METOPROLOL TARTRATE 25 MG PO TABS
25.0000 mg | ORAL_TABLET | Freq: Two times a day (BID) | ORAL | Status: DC
  Administered 2024-03-02 (×2): 25 mg via ORAL
  Filled 2024-03-02 (×2): qty 1

## 2024-03-02 MED ORDER — APIXABAN 5 MG PO TABS
5.0000 mg | ORAL_TABLET | Freq: Two times a day (BID) | ORAL | Status: DC
  Administered 2024-03-02: 5 mg via ORAL
  Filled 2024-03-02: qty 1

## 2024-03-02 MED ORDER — DILTIAZEM HCL ER COATED BEADS 120 MG PO CP24
120.0000 mg | ORAL_CAPSULE | Freq: Every day | ORAL | Status: DC
  Administered 2024-03-02: 120 mg via ORAL
  Filled 2024-03-02: qty 1

## 2024-03-02 MED ORDER — ACETAMINOPHEN 325 MG PO TABS
650.0000 mg | ORAL_TABLET | Freq: Four times a day (QID) | ORAL | Status: DC | PRN
  Administered 2024-03-02: 650 mg via ORAL
  Filled 2024-03-02: qty 2

## 2024-03-02 MED ORDER — PREDNISONE 20 MG PO TABS
50.0000 mg | ORAL_TABLET | Freq: Every day | ORAL | Status: DC
  Administered 2024-03-02: 50 mg via ORAL
  Filled 2024-03-02: qty 3

## 2024-03-02 MED ORDER — AZITHROMYCIN 500 MG PO TABS
500.0000 mg | ORAL_TABLET | Freq: Every day | ORAL | 0 refills | Status: DC
  Filled 2024-03-02: qty 2, 2d supply, fill #0

## 2024-03-02 MED ORDER — BUPROPION HCL ER (SR) 150 MG PO TB12
150.0000 mg | ORAL_TABLET | Freq: Two times a day (BID) | ORAL | Status: DC
  Administered 2024-03-02: 150 mg via ORAL
  Filled 2024-03-02 (×2): qty 1

## 2024-03-02 MED ORDER — LOSARTAN POTASSIUM 50 MG PO TABS
50.0000 mg | ORAL_TABLET | Freq: Every day | ORAL | Status: DC
  Administered 2024-03-02: 50 mg via ORAL
  Filled 2024-03-02: qty 1

## 2024-03-02 MED ORDER — POLYETHYLENE GLYCOL 3350 17 G PO PACK: 17.0000 g | PACK | Freq: Every day | ORAL | Status: DC | PRN

## 2024-03-02 MED ORDER — PREDNISONE 50 MG PO TABS
50.0000 mg | ORAL_TABLET | Freq: Every day | ORAL | 0 refills | Status: AC
  Filled 2024-03-02: qty 4, 4d supply, fill #0

## 2024-03-02 MED ORDER — ACETAMINOPHEN 650 MG RE SUPP: 650.0000 mg | Freq: Four times a day (QID) | RECTAL | Status: DC | PRN

## 2024-03-02 MED ORDER — IPRATROPIUM-ALBUTEROL 0.5-2.5 (3) MG/3ML IN SOLN
3.0000 mL | Freq: Four times a day (QID) | RESPIRATORY_TRACT | Status: DC
  Administered 2024-03-02 (×3): 3 mL via RESPIRATORY_TRACT
  Filled 2024-03-02 (×2): qty 3

## 2024-03-02 MED ORDER — NICOTINE 21 MG/24HR TD PT24
21.0000 mg | MEDICATED_PATCH | Freq: Every day | TRANSDERMAL | Status: DC
  Administered 2024-03-02: 21 mg via TRANSDERMAL
  Filled 2024-03-02: qty 1

## 2024-03-02 NOTE — Progress Notes (Deleted)
 HD#0 SUBJECTIVE:  Patient Summary: Francisco [name] is a 63 year old male with severe COPD, chronic hypoxic respiratory failure, tobacco and meth use, PAH, and heart disease who presents with worsening dyspnea, consistent with a COPD exacerbation.   Overnight Events: admitted overnight   Interim History: Saw pt at bedside. Reports Using supp O2 at home when he moves around and feels SOB. Is on 2L at home but can go up if he feels like he needs more. Was feeling about the same as on admission upon our evaluation. Denies fevers but does get sweaty when he has SOB and feels anxious.   OBJECTIVE:  Vital Signs: Vitals:   03/02/24 0906 03/02/24 0907 03/02/24 0928 03/02/24 1016  BP: (!) 178/92 (!) 178/92 (!) 157/100 (!) 162/90  Pulse:   99 79  Resp:  18 20 16   Temp:   98.2 F (36.8 C) 98.1 F (36.7 C)  TempSrc:   Oral Oral  SpO2:   95% 94%  Weight:      Height:       Supplemental O2: Nasal Cannula SpO2: 94 % O2 Flow Rate (L/min): 2 L/min  Filed Weights   03/02/24 0226 03/02/24 0640  Weight: 90.7 kg 90.7 kg     Intake/Output Summary (Last 24 hours) at 03/02/2024 1113 Last data filed at 03/02/2024 1022 Gross per 24 hour  Intake 120 ml  Output --  Net 120 ml   Net IO Since Admission: 120 mL [03/02/24 1113]  Physical Exam: Physical Exam HENT:     Head: Normocephalic.  Cardiovascular:     Rate and Rhythm: Normal rate and regular rhythm.  Pulmonary:     Comments: Wheezes heard in expiratory phase in BL posterior lung fields. No accessory muscle use.  Musculoskeletal:     Right lower leg: No edema.     Left lower leg: No edema.  Neurological:     Mental Status: He is alert.     Patient Lines/Drains/Airways Status     Active Line/Drains/Airways     Name Placement date Placement time Site Days   Peripheral IV 03/01/24 18 G Anterior;Distal;Left;Upper Arm 03/01/24  2237  Arm  1            Pertinent labs and imaging:      Latest Ref Rng & Units 03/02/2024     4:53 AM 03/01/2024   10:56 PM 03/01/2024    6:57 AM  CBC  WBC 4.0 - 10.5 K/uL 10.5  10.8    Hemoglobin 13.0 - 17.0 g/dL 87.0  86.0  86.0   Hematocrit 39.0 - 52.0 % 40.0  42.9  41.0   Platelets 150 - 400 K/uL 202  222         Latest Ref Rng & Units 03/02/2024    4:53 AM 03/01/2024   10:56 PM 03/01/2024    6:57 AM  CMP  Glucose 70 - 99 mg/dL 808  731    BUN 8 - 23 mg/dL 22  27    Creatinine 9.38 - 1.24 mg/dL 8.72  8.66    Sodium 864 - 145 mmol/L 144  143  139   Potassium 3.5 - 5.1 mmol/L 4.1  4.4  4.1   Chloride 98 - 111 mmol/L 102  100    CO2 22 - 32 mmol/L 32  29    Calcium  8.9 - 10.3 mg/dL 8.7  9.0      No results found.  ASSESSMENT/PLAN:  Assessment: Principal Problem:   COPD, frequent exacerbations (HCC) Active  Problems:   Paroxysmal A-fib (HCC)   Acute respiratory failure with hypoxia (HCC)   Plan: Francisco [name] is a 63 year old male with severe COPD, chronic hypoxic respiratory failure, tobacco and meth use, PAH, and heart disease who presents with worsening dyspnea and admitted for COPD exacerbation.   # COPD Exacerbation # Acute on Chronic Hypoxic Respiratory Failure Turned his supp. O2 off during our evaluation and he was satting well at 95% without supplemental O2. Instructed on maintaining O2 sats between 88-92% at home and to not use O2, if not required. O2 sats were 88% upon ambulation but he desatted once back in bed to 80% for about 3 mins before improving on RA. Will repeat pulse ox later during the day.   Plan: - Prednisone  50 mg daily x5 days, then resume outpatient taper: Day 1/5 - DuoNeb q6h while awake - Continue Breztri  daily - Azithromycin  500 mg : Day 1/3 - Wean O2 to home 2 L/min as tolerated - Ambulatory SpO2 - Pulmonology follow-up at discharge -  OT/PT to assess needs for [facility]  # Tobacco Use Disorder Active smoker. Offered chantix but he has side effects from it--has thoughts about killing his wife when he takes it. Talked about OTC  nicotine  inhaler but reports its too expensive. Agreed to continue with nicotine  patches which he just started using yesterday. Emphasized smoking session which he said he would work on. Will continue home Wellbutrin to aide in smoking cessation.   Plan: - Continue nicotine  patch - Continue Wellbutrin 150 mg PO BID - Continue Smoking cessation counseling  # Essential Hypertension BP elevated likely due to distress and steroids. Cr levels imrpoved and close to normal. Will restart home Losartan .  Plan: - Continue diltiazem  - Restarted home losartan  50 mg PO daily  # Methamphetamine Use (IV) Uses Weekly. Spoke about cessation to which he reported he will work on it.  Plan: - Encourage cessation  # Paroxysmal Atrial Fibrillation Sinus tachycardia on admission. Was not tachycardic on exam.   Plan: - Continue Eliquis  5 mg BID - Continue diltiazem  - Continue metoprolol  25 mg BID  # Dilated Cardiomyopathy, now recovered EF Prior EF 25-30% (2021); now 60-65% (2025). Does not appear volume overload on PE.  Plan: - Continue home GDMT - Monitor volume status  # CAD s/p PCI Plan: - Continue atorvastatin  40 mg daily  # HLD - Continue statin.  Best Practice: Diet: Regular diet VTE: apixaban  5 mg BID Code: Full  Disposition planning:  DISPO: home pending clinical improvement  Signature:  Rebecka Edgardo [facility]  11:13 AM, 03/02/2024  On Call pager [phone]

## 2024-03-02 NOTE — ED Notes (Signed)
 Pt ambulated the unit on RA. SpO2 remained at 88% throughout ambulation. Once sitting in bed pt was at 83%. 1L applied with spO2 @ 95 after 5 mins. O2 discontinued since O2 returned to above 88%.

## 2024-03-02 NOTE — ED Notes (Signed)
 Assuming pt care, pt bib ems coming from home for sob x 2 weeks worse with activity, pt aaox4, mae, skin warm/dry, on 2lt [place] daily. Pt reports med hx. COPD, CHF, HTN, CHL. Pt sts he had multiple visits to ED for same reason left prior to treatment. Denies pain/discomfort at this time, call bell within reach.

## 2024-03-02 NOTE — ED Notes (Addendum)
 Pt rang call bell with complaint of shortness of breath. Pt baseline 2L O2 at home. Currently in room pt was 3L of O2 with tachypnea and endorsing shortness of breath. O2 increased to 4L and duoneb administered per order.

## 2024-03-02 NOTE — Evaluation (Signed)
 Occupational Therapy Evaluation Patient Details Name: Francisco [name] MRN: [id number] DOB: 06-14-1960 Today's Date: 03/02/2024   History of Present Illness   Patient is a 63 yo male presenting to the ED with SOB on 03/01/24, left AMA and returning 11/14 with the same complaint. Admitted for COPD exacerbation. PMH includes CHF, COPD, CAD s/p PCI, HTN, MI, Hep-C, tobacco use, IVDA (previously heroin, methamphetamine).     Clinical Impressions Cloud was evaluated s/p the above admission list. He lives with a roommate and is mod I with 2L O2 at baseline. Upon evaluation, pt demonstrated mod I ability to complete mobility and ADLs. Discussed energy conservation strategies, pt would like a shower chair to tolerate bathing better. Pt does not require further acute, or follow up OT services. Recommend discharge back to pt's environment with assist as needed. OT to sign off with appreciation of order, please re-consult if needed.       If plan is discharge home, recommend the following:   Assist for transportation     Functional Status Assessment   Patient has had a recent decline in their functional status and demonstrates the ability to make significant improvements in function in a reasonable and predictable amount of time.     Equipment Recommendations   Tub/shower seat      Precautions/Restrictions   Precautions Precautions: Fall Precaution/Restrictions Comments: watch O2 Restrictions Weight Bearing Restrictions Per Provider Order: No     Mobility Bed Mobility Overal bed mobility: Independent                  Transfers Overall transfer level: Independent                        Balance Overall balance assessment: No apparent balance deficits (not formally assessed)             ADL either performed or assessed with clinical judgement   ADL Overall ADL's : Independent           Vision Baseline Vision/History: 0 No visual deficits Vision  Assessment?: No apparent visual deficits     Perception Perception: Within Functional Limits       Praxis Praxis: WFL       Pertinent Vitals/Pain Pain Assessment Pain Assessment: No/denies pain     Extremity/Trunk Assessment Upper Extremity Assessment Upper Extremity Assessment: Overall WFL for tasks assessed   Lower Extremity Assessment Lower Extremity Assessment: Overall WFL for tasks assessed   Cervical / Trunk Assessment Cervical / Trunk Assessment: Normal   Communication Communication Communication: No apparent difficulties   Cognition Arousal: Alert           Following commands: Intact       Cueing  General Comments   Cueing Techniques: Verbal cues  VSS on 2L           Home Living Family/patient expects to be discharged to:: Private residence Living Arrangements: Non-relatives/Friends Available Help at Discharge: Friend(s);Available PRN/intermittently Type of Home: House Home Access: Stairs to enter [employer] of Steps: 6 Entrance Stairs-Rails: Right;Left;Can reach both Home Layout: One level     Bathroom Shower/Tub: Chief Strategy Officer: Standard     Home Equipment: Other (comment) (home O2)          Prior Functioning/Environment Prior Level of Function : Independent/Modified Independent                    OT Problem List: Decreased activity tolerance   OT  Treatment/Interventions:        OT Goals(Current goals can be found in the care plan section)   Acute Rehab OT Goals Patient Stated Goal: home OT Goal Formulation: With patient Time For Goal Achievement: 03/02/24 Potential to Achieve Goals: Fair   AM-PAC OT 6 Clicks Daily Activity     Outcome Measure Help from another person eating meals?: None Help from another person taking care of personal grooming?: None Help from another person toileting, which includes using toliet, bedpan, or urinal?: None Help from another person bathing  (including washing, rinsing, drying)?: None Help from another person to put on and taking off regular upper body clothing?: None Help from another person to put on and taking off regular lower body clothing?: None 6 Click Score: 24   End of Session Nurse Communication: Mobility status  Activity Tolerance: Patient tolerated treatment well Patient left: in bed (EOB)  OT Visit Diagnosis: Muscle weakness (generalized) (M62.81)                Time: 8673-8658 OT Time Calculation (min): 15 min Charges:  OT General Charges $OT Visit: 1 Visit OT Evaluation $OT Eval Low Complexity: 1 Low  Lucie Kendall, OTR/L Acute Rehabilitation Services Office [phone] Secure Chat Communication Preferred   Lucie JONETTA Kendall 03/02/2024, 2:50 PM

## 2024-03-02 NOTE — Discharge Summary (Signed)
 Name: Jarris Kortz MRN: [id number] DOB: 04/02/1961 63 y.o. PCP: Heddy Barren, DO  Date of Admission: 03/01/2024 10:36 PM Date of Discharge: 03/02/24 Attending Physician: Dr. MICAEL Riis Winfrey  Discharge Diagnosis: 1. Principal Problem:   COPD, frequent exacerbations (HCC) Active Problems:   Paroxysmal A-fib (HCC)   Acute respiratory failure with hypoxia [facility])   Discharge Medications: Allergies as of 03/02/2024       Reactions   Ivp Dye [iodinated Contrast Media] Other (See Comments)   Seizures   Ultram [tramadol] Other (See Comments)   Seizures   Vibramycin  [doxycycline ] Other (See Comments)   Burning sensation to his hands.        Medication List     TAKE these medications    Airsupra 90-80 MCG/ACT Aero Generic drug: Albuterol -Budesonide  Inhale 2 puffs into the lungs every 4 (four) hours as needed (wheeze, shortness of breath).   albuterol  (2.5 MG/3ML) 0.083% nebulizer solution Commonly known as: PROVENTIL  Take 2.5 mg by nebulization every 6 (six) hours as needed for wheezing or shortness of breath.   albuterol  108 (90 Base) MCG/ACT inhaler Commonly known as: VENTOLIN  HFA Inhale 1-2 puffs into the lungs every 6 (six) hours as needed for wheezing or shortness of breath.   apixaban  5 MG Tabs tablet Commonly known as: Eliquis  Take 1 tablet (5 mg total) by mouth 2 (two) times daily.   atorvastatin  40 MG tablet Commonly known as: LIPITOR  Take 1 tablet (40 mg total) by mouth daily.   azithromycin  500 MG tablet Commonly known as: ZITHROMAX  Take 1 tablet (500 mg total) by mouth daily. Start taking on: March 03, 2024   benzonatate  200 MG capsule Commonly known as: TESSALON  Take 1 capsule (200 mg total) by mouth 3 (three) times daily as needed for cough.   buPROPion 150 MG 12 hr tablet Commonly known as: Wellbutrin SR Take 1 tablet (150 mg total) by mouth 2 (two) times daily. Take 1 tablet once daily for the first 3 days (on 10/15-10/17), then take  one tablet twice daily (10/18 and onward)   diltiazem  120 MG 24 hr capsule Commonly known as: CARDIZEM  CD Take 1 capsule (120 mg total) by mouth daily.   Dupixent  300 MG/2ML Soaj Generic drug: Dupilumab  Inject 300 mg into the skin every 14 (fourteen) days. What changed:  when to take this additional instructions   fluticasone  50 MCG/ACT nasal spray Commonly known as: FLONASE  Place 1 spray into both nostrils daily. What changed:  when to take this reasons to take this   guaiFENesin -dextromethorphan  100-10 MG/5ML syrup Commonly known as: ROBITUSSIN DM Take 15 mLs by mouth 4 (four) times daily.   hydrOXYzine  25 MG tablet Commonly known as: ATARAX  Take 25 mg by mouth 3 (three) times daily as needed for anxiety.   ipratropium-albuterol  0.5-2.5 (3) MG/3ML Soln Commonly known as: DUONEB Take 3 mLs by nebulization every 4 (four) hours as needed.   loratadine  10 MG tablet Commonly known as: Claritin  Take 1 tablet (10 mg total) by mouth daily.   losartan  50 MG tablet Commonly known as: COZAAR  Take 1 tablet (50 mg total) by mouth daily.   metoprolol  tartrate 25 MG tablet Commonly known as: LOPRESSOR  Take 1 tablet (25 mg total) by mouth 2 (two) times daily.   nicotine  21 mg/24hr patch Commonly known as: NICODERM CQ  - dosed in mg/24 hours Place 1 patch (21 mg total) onto the skin daily.   nitroGLYCERIN  0.4 MG SL tablet Commonly known as: NITROSTAT  Place 1 tablet (0.4 mg total)  under the tongue every 5 (five) minutes x 3 doses as needed for chest pain.   OXYGEN  Inhale 2-4 L/min into the lungs See admin instructions. Inhale 2 L/min continuously (baseline). May increase up to 4 L/min if needed for poor oxygen  saturation or exertion.   predniSONE  50 MG tablet Commonly known as: DELTASONE  Take 1 tablet (50 mg total) by mouth daily for 4 days. What changed: when to take this   thiamine  100 MG tablet Commonly known as: VITAMIN B1 Take 1 tablet (100 mg total) by mouth  daily.   Trelegy Ellipta  100-62.5-25 MCG/ACT Aepb Generic drug: Fluticasone -Umeclidin-Vilant Inhale 1 puff into the lungs daily.               Durable Medical Equipment  (From admission, onward)           Start     Ordered   03/02/24 1510  For home use only DME Other see comment  Once       Comments: Please provide portable oxygen  tank for discharge to home - 2L/min [place] oxygen  tank.  Question:  Length of Need  Answer:  6 Months   03/02/24 1510   03/02/24 1454  For home use only DME Nebulizer machine  Once       Question Answer Comment  Patient needs a nebulizer to treat with the following condition COPD (chronic obstructive pulmonary disease) (HCC)   Length of Need Lifetime   Additional equipment included Administration kit   Additional equipment included Filter      03/02/24 1454   03/02/24 0425  For home use only DME Shower stool  Once        03/02/24 0424            Disposition and follow-up:   Mr.Sundeep Cherian was discharged from [facility] in Stable condition.  At the hospital follow up visit please address:  1.  His SOB and Supplemental O2 use. -Talk about smoking and methamphetamine cessation -Ask if he completed his azithromycin  (3 days) and prednisone  (5 days) course prescribed during this admission. -Make sure to taper his prednisone  dose that was started prior to this rotation -Make sure he received his new nebulizer machine   2.  Labs / imaging needed at time of follow-up: BMP  3.  Pending labs/ test needing follow-up: none  Follow-up Appointments:  Follow-up Information     [employer] (DME) Follow up.   Specialty: DME Services Why: [employer] will deliver a shower stool and nebulizer to the home. Contact information: [address] [place]  [address] [phone]                 Hospital Course by problem list:  Francisco [name] is a 64 year old male with severe COPD, chronic  hypoxic respiratory failure, tobacco and meth use, PAH, and heart disease who presents with worsening dyspnea and admitted for a COPD exacerbation.  # COPD Exacerbation # Acute on Chronic Hypoxic Respiratory Failure Patient presented with shortness of breath.  He has had persistent dyspnea and hypoxia with exertion which is worsening over the past 2 weeks to the point that he becomes short of breath after walking a few steps.  Denied any fevers, chills, new cough, sputum change on admission. Pt was discharged a few days back for COPD exacerbation. Returned to ED 2 days later when he received DuoNebs and IV magnesium  and left AMA when BiPAP was recommended for him.  PE less likely as D-dimer  levels were low.  Treated patient with prednisone , DuoNeb, Breztri , azithromycin . turned his supp. On discharge day evaluation patient was satting well at 95% after we turned off his supplemental O2. Instructed on maintaining O2 sats between 88-92% at home and to not use O2, if not required. O2 sats were 88% upon ambulation but he desatted once back in bed to 80% for about 3 mins before improving on RA. Repeat pulse ox later during the day showed O2 sats sustained above 92% on room air as the patient ambulated.  Patient was in a stable condition to be discharged.  Encouraged him to use his home inhalers and continue with nebulizer therapy. Made arrangements for his new nebulizer machine as he complained his machine at home was not working properly.  Plan: - Prednisone  50 mg daily x5 days, then resume outpatient taper: Day 1/5 - DuoNeb q6h while awake - Continue Breztri  daily - Azithromycin  500 mg : Day 1/3 - Wean O2 to home 2 L/min as tolerated - Ambulatory SpO2 - Pulmonology follow-up at discharge -  OT/PT to assess needs for [facility]   # Tobacco Use Disorder Active smoker. Offered chantix but he has side effects from it--has thoughts about killing his wife when he takes it. Talked about OTC nicotine  inhaler but  reports its too expensive. Agreed to continue with nicotine  patches which he just started using yesterday. Emphasized smoking session which he said he would work on. Will continue home Wellbutrin to aide in smoking cessation.    Plan: - Continue nicotine  patch - Continue Wellbutrin 150 mg PO BID - Continue Smoking cessation counseling   # Essential Hypertension BP elevated likely due to distress and steroids. Cr levels imrpoved and close to normal.  Restarted home Losartan .  Plan: - Continue diltiazem  - Restarted home losartan  50 mg PO daily   # Methamphetamine Use (IV) Uses Weekly. Spoke about cessation to which he reported he will work on it.   Plan: - Encourage cessation  # Paroxysmal Atrial Fibrillation Sinus tachycardia on admission. Was not tachycardic on exam.    Plan: - Continue Eliquis  5 mg BID - Continue diltiazem  - Continue metoprolol  25 mg BID  # Dilated Cardiomyopathy, now recovered EF Prior EF 25-30% (2021); now 60-65% (2025). Does not appear volume overload on PE.  Plan: - Continue home GDMT - Monitor volume status  # CAD s/p PCI Plan: - Continue atorvastatin  40 mg daily  # HLD - Continue statin.    Subjective Saw pt at bedside. Reports Using supp O2 at home when he moves around and feels SOB. Is on 2L at home but can go up if he feels like he needs more. Was feeling about the same as on admission upon our evaluation. Denies fevers but does get sweaty when he has SOB and feels anxious.   Discharge Exam:   BP (!) 155/98 (BP Location: Left Arm)   Pulse 89   Temp 97.7 F (36.5 C) (Oral)   Resp 18   Ht 6' (1.829 m)   Wt 90.7 kg   SpO2 93%   BMI 27.12 kg/m  Discharge exam:  HENT:     Head: Normocephalic.  Cardiovascular:     Rate and Rhythm: Normal rate and regular rhythm.  Pulmonary:     Comments: Wheezes heard in expiratory phase in BL posterior lung fields. No accessory muscle use.  Musculoskeletal:     Right lower leg: No edema.     Left  lower leg: No edema.  Neurological:  Mental Status: He is alert.   Pertinent Labs, Studies, and Procedures:     Latest Ref Rng & Units 03/02/2024    4:53 AM 03/01/2024   10:56 PM 03/01/2024    6:57 AM  CBC  WBC 4.0 - 10.5 K/uL 10.5  10.8    Hemoglobin 13.0 - 17.0 g/dL 87.0  86.0  86.0   Hematocrit 39.0 - 52.0 % 40.0  42.9  41.0   Platelets 150 - 400 K/uL 202  222         Latest Ref Rng & Units 03/02/2024    4:53 AM 03/01/2024   10:56 PM 03/01/2024    6:57 AM  CMP  Glucose 70 - 99 mg/dL 808  731    BUN 8 - 23 mg/dL 22  27    Creatinine 9.38 - 1.24 mg/dL 8.72  8.66    Sodium 864 - 145 mmol/L 144  143  139   Potassium 3.5 - 5.1 mmol/L 4.1  4.4  4.1   Chloride 98 - 111 mmol/L 102  100    CO2 22 - 32 mmol/L 32  29    Calcium  8.9 - 10.3 mg/dL 8.7  9.0      DG Chest Port 1 View Result Date: 03/01/2024 EXAM: 1 VIEW(S) XRAY OF THE CHEST 03/01/2024 07:07:04 AM COMPARISON: 02/27/2024 CLINICAL HISTORY: resp distress FINDINGS: LUNGS AND PLEURA: No focal pulmonary opacity. No pleural effusion. No pneumothorax. HEART AND MEDIASTINUM: No acute abnormality of the cardiac and mediastinal silhouettes. BONES AND SOFT TISSUES: No acute osseous abnormality. IMPRESSION: 1. No acute process. Electronically signed by: Evalene Coho MD 03/01/2024 07:12 AM EST RP Workstation: [id number]     Discharge Instructions: Discharge Instructions     Call MD for:  difficulty breathing, headache or visual disturbances   Complete by: As directed    Diet - low sodium heart healthy   Complete by: As directed    Increase activity slowly   Complete by: As directed        Signed: Edgardo Pontiff, DO 03/02/2024, 6:24 PM

## 2024-03-02 NOTE — H&P (Addendum)
 Date: 03/02/2024               Patient Name:  Francisco [name] MRN: [id number]  DOB: 09/26/1960 Age / Sex: 63 y.o., male   PCP: Heddy Barren, DO         Medical Service: Internal Medicine Teaching Service         Attending Physician: Dr. MICAEL Riis Winfrey      First Contact: Doyal Miyamoto, MD}    Second Contact: Dr. Drue Grow, MD         Pager Information: First Contact Pager: [phone]   Second Contact Pager: [phone]   SUBJECTIVE   Chief Complaint: SOB/DOE  History of Present Illness: Francisco [name] is a 63 year old male with a history of severe COPD, chronic hypoxic respiratory failure on 2 L home O?, pulmonary hypertension, paroxysmal atrial fibrillation on Eliquis , hypertension, dilated cardiomyopathy (EF now recovered), CAD s/p PCI, hepatitis C, tobacco use, and methamphetamine use who presents with worsening shortness of breath.  He was discharged 3 days ago after admission for COPD exacerbation. Since discharge, he has had persistent dyspnea and hypoxia with exertion. Symptoms worsened over the past 2 weeks to the point that he becomes short of breath after walking ~10 steps and must stop multiple times while showering. He denies fever, chills, new cough, sputum change, pleuritic chest pain, or leg swelling. He uses 2 L home oxygen  but increases it when symptoms worsen.  He presented to the ED yesterday morning, received DuoNebs and IV magnesium , and was recommended BiPAP, which he declined and left AMA. After contacting pulmonology, a clinic nurse advised him to return to the ED due to persistent symptoms.  Echo from 2025 shows EF 60-65%, normal LV function, mild LVH, and LV wall scoring with normal mid-inferoseptal segment.    ED Course: - Labs significant for elevated venous CO2 (58.3) but otherwise unremarkable; RVP negative - Imaging CXR without acute infiltrate or effusion; EKG with sinus tachycardia and no ischemic changes - Received multiple DuoNebs, IV magnesium , and  supplemental O2 up to 6 L/min - Consulted medicine for admission; pulmonology clinic nurse advised return but no formal consult placed  Meds:  Patient reported:  No outpatient medications have been marked as taking for the 03/01/24 encounter [facility] Encounter).  Ventolin  inhaler PRN AirSupra PRN Trelegy Dupixent  q14 days Prednisone  taper DuoNebs PRN Eliquis  5 mg BID Diltiazem  120 mg daily Metoprolol  tartrate 25 mg BID Losartan  50 mg daily Atorvastatin  40 mg daily Bupropion SR Loratadine  Flonase  Guaifenesin  Sublingual nitroglycerin  PRN 2 L home oxygen   Past Medical History COPD with acute exacerbations Chronic hypoxic respiratory failure Pulmonary hypertension Dilated cardiomyopathy (EF 25-30% in 2021 ? 60-65% in 2025) CAD s/p PCI Paroxysmal atrial fibrillation Hypertension Hyperlipidemia Hepatitis C History of stroke/TIA Tobacco use disorder Methamphetamine use disorder Medication noncompliance  Past Surgical History Past Surgical History:  Procedure Laterality Date   BACK SURGERY     NECK SURGERY     RIGHT/LEFT HEART CATH AND CORONARY ANGIOGRAPHY N/A 04/30/2019   Procedure: RIGHT/LEFT HEART CATH AND CORONARY ANGIOGRAPHY;  Surgeon: Anner Alm ORN, MD;  Location: [facility] INVASIVE CV LAB;  Service: Cardiovascular;  Laterality: N/A;     Social:  Lives With:Roommate  Occupation:Retired, On disability/limited finances Support:Lives with roommate; minimal support Level of Function: Independent  PCP:  Education Officer, Community, [name], DO  Substances: -Tobacco: Half a pack a day -Alcohol: Denied -Recreational Drug: IV Methamphetamine once a week    Family History:  Family History  Problem Relation Age of Onset  Rheum arthritis Mother    CVA Father    Heart disease Paternal Grandfather      Allergies: Allergies as of 03/01/2024 - Review Complete 03/01/2024  Allergen Reaction Noted   Ivp dye [iodinated contrast media] Other (See Comments) 06/12/2018   Tramadol Other  (See Comments) 06/12/2018   Doxycycline  Other (See Comments) 02/01/2023    Review of Systems: A complete ROS was negative except as per HPI.   OBJECTIVE:   Physical Exam: Blood pressure (!) 165/88, pulse (!) 108, temperature 98.3 F (36.8 C), temperature source Oral, resp. rate (!) 21, SpO2 100%.  Vitals: BP 163/104, HR 102, RR 26, Temp 97.12F, SpO? 98% on O?, Weight 90.7 kg. General: No acute distress, sitting upright. HEENT: MMM, no congestion, no pharyngeal erythema. Neck: No JVD. CV: Tachycardic, regular rhythm, no murmurs; strong radial pulses; no LE edema. Resp: Increased work of breathing but speaking full sentences; diffuse mild wheezing throughout. Abd: Soft, non-tender. Skin: Warm, dry Neuro: Alert and oriented x3; normal speech; no focal deficits.   Labs: CBC    Component Value Date/Time   WBC 10.8 (H) 03/01/2024 2256   RBC 4.43 03/01/2024 2256   HGB 13.9 03/01/2024 2256   HGB 15.5 12/06/2023 1528   HCT 42.9 03/01/2024 2256   HCT 47.1 12/06/2023 1528   PLT 222 03/01/2024 2256   PLT 285 12/06/2023 1528   MCV 96.8 03/01/2024 2256   MCV 97 12/06/2023 1528   MCH 31.4 03/01/2024 2256   MCHC 32.4 03/01/2024 2256   RDW 13.3 03/01/2024 2256   RDW 13.6 12/06/2023 1528   LYMPHSABS 0.5 (L) 03/01/2024 2256   LYMPHSABS 1.7 05/18/2021 1518   MONOABS 0.3 03/01/2024 2256   EOSABS 0.0 03/01/2024 2256   EOSABS 0.0 05/18/2021 1518   BASOSABS 0.0 03/01/2024 2256   BASOSABS 0.0 05/18/2021 1518     CMP     Component Value Date/Time   NA 143 03/01/2024 2256   NA 144 12/06/2023 1528   K 4.4 03/01/2024 2256   CL 100 03/01/2024 2256   CO2 29 03/01/2024 2256   GLUCOSE 268 (H) 03/01/2024 2256   BUN 27 (H) 03/01/2024 2256   BUN 9 12/06/2023 1528   CREATININE 1.33 (H) 03/01/2024 2256   CALCIUM  9.0 03/01/2024 2256   PROT 6.3 (L) 02/26/2024 0938   PROT 6.9 12/06/2023 1528   ALBUMIN  3.5 02/26/2024 0938   ALBUMIN  4.1 12/06/2023 1528   AST 29 02/26/2024 0938   ALT 46 (H)  02/26/2024 0938   ALKPHOS 61 02/26/2024 0938   BILITOT 1.2 02/26/2024 0938   BILITOT 0.5 12/06/2023 1528   GFRNONAA >60 03/01/2024 2256   GFRAA >60 01/11/2020 0422    Imaging:  DG Chest Port 1 View Result Date: 03/01/2024 EXAM: 1 VIEW(S) XRAY OF THE CHEST 03/01/2024 07:07:04 AM COMPARISON: 02/27/2024 CLINICAL HISTORY: resp distress FINDINGS: LUNGS AND PLEURA: No focal pulmonary opacity. No pleural effusion. No pneumothorax. HEART AND MEDIASTINUM: No acute abnormality of the cardiac and mediastinal silhouettes. BONES AND SOFT TISSUES: No acute osseous abnormality. IMPRESSION: 1. No acute process. Electronically signed by: Evalene Coho MD 03/01/2024 07:12 AM EST RP Workstation: [id number]     EKG: personally reviewed my interpretation is sinus tachycardia.  ASSESSMENT & PLAN:   Assessment & Plan by Problem: Principal Problem:   COPD, frequent exacerbations (HCC) Active Problems:   Paroxysmal A-fib (HCC)   Acute respiratory failure with hypoxia (HCC)   Deyonte Cadden is a 63 year old male with severe COPD, chronic hypoxic respiratory failure, tobacco  and meth use, PAH, and heart disease who presents with worsening dyspnea, consistent with a COPD exacerbation.  # COPD Exacerbation # Acute on Chronic Hypoxic Respiratory Failure Worsening over 2 weeks. Responded to EMS DuoNebs and magnesium . No evidence of pneumonia, ACS, PE, or heart failure today. High risk given repeated recent admissions and active smoking. Plan: - Prednisone  50 mg daily x5 days, then resume outpatient taper - DuoNeb q4h while awake - Continue maintenance inhalers: LAMA/LABA/ICS via nebulizer equivalent - Continue guaifenesin  - Wean O2 to home 2 L/min as tolerated - Ambulatory SpO2 - Smoking cessation counseling - Pulmonology follow-up at discharge - He expressed interest in home health services, so OT/PT were initiated to assess needs   # Paroxysmal Atrial Fibrillation Currently in sinus tachycardia. High  stroke risk. Plan: - Continue Eliquis  5 mg BID - Continue diltiazem  - Continue metoprolol    # Dilated Cardiomyopathy, now recovered EF Prior EF 25-30% (2021); now 60-65% (2025). No volume overload. Plan: - Continue home GDMT - Monitor volume status  # CAD s/p PCI Chest symptoms non-cardiac and resolved. Plan: - Continue atorvastatin  40 mg daily  # Essential Hypertension BP elevated likely due to distress and steroids. Plan: - Continue losartan , diltiazem   # Methamphetamine Use (IV) Weekly. Small wrist blister without cellulitis. Plan: - Encourage cessation - Offer resources (NA, rehab) - Monitor injection site  # Tobacco Use Disorder Active smoker. Plan: - Continue nicotine  patch - Smoking cessation counseling  # HLD - Continue statin.  Best practice: Diet: Normal VTE: DOAC IVF: None,None Code: Full  Disposition planning: Prior to Admission Living Arrangement: Home, living   Anticipated Discharge Location: Home  Dispo: Admit patient to Observation with expected length of stay less than 2 midnights.  Signed: Bernadine Manos, MD Internal Medicine Resident  03/02/2024, 1:13 AM  On Call pager: [phone]

## 2024-03-02 NOTE — Telephone Encounter (Signed)
 Note from ER MD from 11/13 states patient willing to be admitted now that it has been recommended per pulmonologist.  Nothing further needed.

## 2024-03-02 NOTE — Hospital Course (Addendum)
 Francisco [name] is a 63 year old male with severe COPD, chronic hypoxic respiratory failure, tobacco and meth use, PAH, and heart disease who presents with worsening dyspnea and admitted for a COPD exacerbation.  # COPD Exacerbation # Acute on Chronic Hypoxic Respiratory Failure Patient presented with shortness of breath.  He has had persistent dyspnea and hypoxia with exertion which is worsening over the past 2 weeks to the point that he becomes short of breath after walking a few steps.  Denied any fevers, chills, new cough, sputum change on admission. Pt was discharged a few days back for COPD exacerbation. Returned to ED 2 days later when he received DuoNebs and IV magnesium  and left AMA when BiPAP was recommended for him.  PE less likely as D-dimer levels were low.  Treated patient with prednisone , DuoNeb, Breztri , azithromycin . turned his supp. On discharge day evaluation patient was satting well at 95% after we turned off his supplemental O2. Instructed on maintaining O2 sats between 88-92% at home and to not use O2, if not required. O2 sats were 88% upon ambulation but he desatted once back in bed to 80% for about 3 mins before improving on RA. Repeat pulse ox later during the day showed O2 sats sustained above 92% on room air as the patient ambulated.  Patient was in a stable condition to be discharged.  Encouraged him to use his home inhalers and continue with nebulizer therapy. Made arrangements for his new nebulizer machine as he complained his machine at home was not working properly.  Plan: - Prednisone  50 mg daily x5 days, then resume outpatient taper: Day 1/5 - DuoNeb q6h while awake - Continue Breztri  daily - Azithromycin  500 mg : Day 1/3 - Wean O2 to home 2 L/min as tolerated - Ambulatory SpO2 - Pulmonology follow-up at discharge -  OT/PT to assess needs for [facility]   # Tobacco Use Disorder Active smoker. Offered chantix but he has side effects from it--has thoughts about killing his wife  when he takes it. Talked about OTC nicotine  inhaler but reports its too expensive. Agreed to continue with nicotine  patches which he just started using yesterday. Emphasized smoking session which he said he would work on. Will continue home Wellbutrin to aide in smoking cessation.    Plan: - Continue nicotine  patch - Continue Wellbutrin 150 mg PO BID - Continue Smoking cessation counseling   # Essential Hypertension BP elevated likely due to distress and steroids. Cr levels imrpoved and close to normal.  Restarted home Losartan .  Plan: - Continue diltiazem  - Restarted home losartan  50 mg PO daily   # Methamphetamine Use (IV) Uses Weekly. Spoke about cessation to which he reported he will work on it.   Plan: - Encourage cessation  # Paroxysmal Atrial Fibrillation Sinus tachycardia on admission. Was not tachycardic on exam.    Plan: - Continue Eliquis  5 mg BID - Continue diltiazem  - Continue metoprolol  25 mg BID  # Dilated Cardiomyopathy, now recovered EF Prior EF 25-30% (2021); now 60-65% (2025). Does not appear volume overload on PE.  Plan: - Continue home GDMT - Monitor volume status  # CAD s/p PCI Plan: - Continue atorvastatin  40 mg daily  # HLD - Continue statin.

## 2024-03-02 NOTE — Progress Notes (Addendum)
 Transition of Care [facility]) - Inpatient Brief Assessment   Patient Details  Name: Francisco [name] MRN: [id number] Date of Birth: 01/01/1961  Transition of Care [facility]) CM/SW Contact:    Rosaline JONELLE Joe, RN Phone Number: 03/02/2024, 3:01 PM   Clinical Narrative: CM met with the patient at the bedside and patient states that he admitted for severe COPD, chronic hypoxic respiratory failure.  Patient remains on chronic oxygen  at home at 2L/min [place] at home through [employer].  Patient states that he plans to take portable oxygen  tank home from discharge lounge and bring it back.  Patient requests shower chair and new nebulizer machine.  Patient offered choice regarding DME company but patient did not have a preference.  I called [employer] and requested delivery of both to the home by drop shipment - [employer] aware.  DME orders noted - to be co-signed by MD.  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  OP counseling provided in the AVS.  Patient states that his friend will provide transportation home by car.  I called [employer] and asked that portable oxygen  tank be delivered to the discharge lounge.  DME order for portable tank placed to be co-signed by MD.  Meds are being sent to [facility] pharmacy and patient is aware.     Transition of Care Asessment: Insurance and Status: (P) Insurance coverage has been reviewed Patient has primary care physician: (P) Yes Home environment has been reviewed: (P) from home - lives at boarding house Prior level of function:: (P) self Prior/Current Home Services: (P) No current home services Social Drivers of Health Review: (P) SDOH reviewed interventions complete Readmission risk has been reviewed: (P) Yes Transition of care needs: (P) transition of care needs identified, TOC will continue to follow

## 2024-03-02 NOTE — ED Notes (Signed)
 Pt pulse ox checked while ambulating. Pt became short of breath when standing and had tachypnea. SpO2 remained at 100 during episode. Pt shaky and BP elevated. Pt unable to fully ambulate due to shortness of breath. Pt returned to bed with call bell in place.

## 2024-03-02 NOTE — Progress Notes (Signed)
 Patient demanded to be discharged immediately after discharge orders were placed despite this RN recommendations to wait for his oxygen  tank to be delivered. Pt refused and assumed complete responsibility as well as verbalizing that he is leaving the premises without his oxygen  tank, this RN transported patient to the drive up curb at the emergency room where his ride was waiting.

## 2024-03-02 NOTE — Progress Notes (Signed)
 Patient ambulated around the unit for about 60 feet, oxygen  saturation sustained above 92% in room air.

## 2024-03-02 NOTE — Progress Notes (Signed)
    Durable Medical Equipment  (From admission, onward)           Start     Ordered   03/02/24 1454  For home use only DME Nebulizer machine  Once       Question Answer Comment  Patient needs a nebulizer to treat with the following condition COPD (chronic obstructive pulmonary disease) (HCC)   Length of Need Lifetime   Additional equipment included Administration kit   Additional equipment included Filter      03/02/24 1454   03/02/24 0425  For home use only DME Shower stool  Once        03/02/24 0424

## 2024-03-02 NOTE — Discharge Instructions (Addendum)
 To Francisco [name] or their caretakers,  You were recently admitted to [facility] for exacerbation of COPD.   Continue taking your home medications with the following changes:  Start taking Azithromycin  500 mg: 1 tablet daily for the next two days: 03/03/24, 03/04/24 Prednisone  50 mg: Take one tablet by mouth daily for 4 more days starting tomorrow. Continue your other home prednisone  daily as instructed by your PCP Continue taking All other home medications  -We have made arrangements for a new nebulizer machine which will be shipped to your home   You should seek further medical care if your symptoms worsen, as discussed.  Please follow up with the following doctors/specialties: PCP at [facility] within one week of discharge  We recommend that you also see your primary care doctor in about a week to make sure that you continue to improve. We are so glad that you are feeling better.  Sincerely,  [facility]

## 2024-03-03 ENCOUNTER — Other Ambulatory Visit: Payer: Self-pay

## 2024-03-03 ENCOUNTER — Encounter (HOSPITAL_COMMUNITY): Payer: Self-pay | Admitting: Emergency Medicine

## 2024-03-03 ENCOUNTER — Observation Stay (HOSPITAL_COMMUNITY)
Admission: EM | Admit: 2024-03-03 | Discharge: 2024-03-05 | DRG: 189 | Disposition: A | Attending: Infectious Diseases | Admitting: Infectious Diseases

## 2024-03-03 ENCOUNTER — Emergency Department (HOSPITAL_COMMUNITY)

## 2024-03-03 DIAGNOSIS — I420 Dilated cardiomyopathy: Secondary | ICD-10-CM | POA: Diagnosis present

## 2024-03-03 DIAGNOSIS — Z743 Need for continuous supervision: Secondary | ICD-10-CM | POA: Diagnosis not present

## 2024-03-03 DIAGNOSIS — F17210 Nicotine dependence, cigarettes, uncomplicated: Secondary | ICD-10-CM | POA: Diagnosis present

## 2024-03-03 DIAGNOSIS — J441 Chronic obstructive pulmonary disease with (acute) exacerbation: Secondary | ICD-10-CM | POA: Diagnosis not present

## 2024-03-03 DIAGNOSIS — I252 Old myocardial infarction: Secondary | ICD-10-CM

## 2024-03-03 DIAGNOSIS — Z8261 Family history of arthritis: Secondary | ICD-10-CM

## 2024-03-03 DIAGNOSIS — I272 Pulmonary hypertension, unspecified: Secondary | ICD-10-CM | POA: Diagnosis present

## 2024-03-03 DIAGNOSIS — T380X5A Adverse effect of glucocorticoids and synthetic analogues, initial encounter: Secondary | ICD-10-CM | POA: Diagnosis present

## 2024-03-03 DIAGNOSIS — Z8249 Family history of ischemic heart disease and other diseases of the circulatory system: Secondary | ICD-10-CM

## 2024-03-03 DIAGNOSIS — J9622 Acute and chronic respiratory failure with hypercapnia: Secondary | ICD-10-CM | POA: Diagnosis present

## 2024-03-03 DIAGNOSIS — R0689 Other abnormalities of breathing: Secondary | ICD-10-CM | POA: Diagnosis not present

## 2024-03-03 DIAGNOSIS — J9621 Acute and chronic respiratory failure with hypoxia: Principal | ICD-10-CM | POA: Diagnosis present

## 2024-03-03 DIAGNOSIS — Z823 Family history of stroke: Secondary | ICD-10-CM

## 2024-03-03 DIAGNOSIS — Z955 Presence of coronary angioplasty implant and graft: Secondary | ICD-10-CM

## 2024-03-03 DIAGNOSIS — Z91041 Radiographic dye allergy status: Secondary | ICD-10-CM

## 2024-03-03 DIAGNOSIS — F419 Anxiety disorder, unspecified: Secondary | ICD-10-CM | POA: Diagnosis present

## 2024-03-03 DIAGNOSIS — D72828 Other elevated white blood cell count: Secondary | ICD-10-CM | POA: Diagnosis present

## 2024-03-03 DIAGNOSIS — Z885 Allergy status to narcotic agent status: Secondary | ICD-10-CM

## 2024-03-03 DIAGNOSIS — E785 Hyperlipidemia, unspecified: Secondary | ICD-10-CM | POA: Diagnosis present

## 2024-03-03 DIAGNOSIS — R000 Tachycardia, unspecified: Secondary | ICD-10-CM | POA: Diagnosis not present

## 2024-03-03 DIAGNOSIS — I5032 Chronic diastolic (congestive) heart failure: Secondary | ICD-10-CM | POA: Diagnosis present

## 2024-03-03 DIAGNOSIS — B1920 Unspecified viral hepatitis C without hepatic coma: Secondary | ICD-10-CM | POA: Diagnosis present

## 2024-03-03 DIAGNOSIS — Z881 Allergy status to other antibiotic agents status: Secondary | ICD-10-CM

## 2024-03-03 DIAGNOSIS — I10 Essential (primary) hypertension: Secondary | ICD-10-CM | POA: Diagnosis not present

## 2024-03-03 DIAGNOSIS — Z7901 Long term (current) use of anticoagulants: Secondary | ICD-10-CM

## 2024-03-03 DIAGNOSIS — Z79899 Other long term (current) drug therapy: Secondary | ICD-10-CM

## 2024-03-03 DIAGNOSIS — I4811 Longstanding persistent atrial fibrillation: Secondary | ICD-10-CM | POA: Diagnosis present

## 2024-03-03 DIAGNOSIS — R059 Cough, unspecified: Secondary | ICD-10-CM | POA: Diagnosis not present

## 2024-03-03 DIAGNOSIS — J9601 Acute respiratory failure with hypoxia: Secondary | ICD-10-CM | POA: Diagnosis not present

## 2024-03-03 DIAGNOSIS — Z8673 Personal history of transient ischemic attack (TIA), and cerebral infarction without residual deficits: Secondary | ICD-10-CM

## 2024-03-03 DIAGNOSIS — Z608 Other problems related to social environment: Secondary | ICD-10-CM | POA: Diagnosis present

## 2024-03-03 DIAGNOSIS — I110 Hypertensive heart disease with heart failure: Secondary | ICD-10-CM | POA: Diagnosis present

## 2024-03-03 DIAGNOSIS — Z720 Tobacco use: Secondary | ICD-10-CM

## 2024-03-03 DIAGNOSIS — R0603 Acute respiratory distress: Secondary | ICD-10-CM | POA: Diagnosis not present

## 2024-03-03 DIAGNOSIS — F17200 Nicotine dependence, unspecified, uncomplicated: Secondary | ICD-10-CM

## 2024-03-03 DIAGNOSIS — F1590 Other stimulant use, unspecified, uncomplicated: Secondary | ICD-10-CM | POA: Diagnosis present

## 2024-03-03 DIAGNOSIS — Z9981 Dependence on supplemental oxygen: Secondary | ICD-10-CM

## 2024-03-03 DIAGNOSIS — Z716 Tobacco abuse counseling: Secondary | ICD-10-CM

## 2024-03-03 DIAGNOSIS — R0902 Hypoxemia: Secondary | ICD-10-CM | POA: Diagnosis not present

## 2024-03-03 DIAGNOSIS — I2510 Atherosclerotic heart disease of native coronary artery without angina pectoris: Secondary | ICD-10-CM | POA: Diagnosis present

## 2024-03-03 LAB — I-STAT VENOUS BLOOD GAS, ED
Acid-Base Excess: 8 mmol/L — ABNORMAL HIGH (ref 0.0–2.0)
Bicarbonate: 33.6 mmol/L — ABNORMAL HIGH (ref 20.0–28.0)
Calcium, Ion: 1.05 mmol/L — ABNORMAL LOW (ref 1.15–1.40)
HCT: 42 % (ref 39.0–52.0)
Hemoglobin: 14.3 g/dL (ref 13.0–17.0)
O2 Saturation: 96 %
Potassium: 4.8 mmol/L (ref 3.5–5.1)
Sodium: 140 mmol/L (ref 135–145)
TCO2: 35 mmol/L — ABNORMAL HIGH (ref 22–32)
pCO2, Ven: 47.1 mmHg (ref 44–60)
pH, Ven: 7.462 — ABNORMAL HIGH (ref 7.25–7.43)
pO2, Ven: 81 mmHg — ABNORMAL HIGH (ref 32–45)

## 2024-03-03 LAB — BASIC METABOLIC PANEL WITH GFR
Anion gap: 8 (ref 5–15)
BUN: 22 mg/dL (ref 8–23)
CO2: 29 mmol/L (ref 22–32)
Calcium: 8.6 mg/dL — ABNORMAL LOW (ref 8.9–10.3)
Chloride: 103 mmol/L (ref 98–111)
Creatinine, Ser: 1.21 mg/dL (ref 0.61–1.24)
GFR, Estimated: >60 mL/min (ref >60)
Glucose, Bld: 137 mg/dL — ABNORMAL HIGH (ref 70–99)
Potassium: 4.8 mmol/L (ref 3.5–5.1)
Sodium: 140 mmol/L (ref 135–145)

## 2024-03-03 LAB — CBC WITH DIFFERENTIAL/PLATELET
Abs Immature Granulocytes: 0.23 K/uL — ABNORMAL HIGH (ref 0.00–0.07)
Basophils Absolute: 0.1 K/uL (ref 0.0–0.1)
Basophils Relative: 0 %
Eosinophils Absolute: 0 K/uL (ref 0.0–0.5)
Eosinophils Relative: 0 %
HCT: 43.6 % (ref 39.0–52.0)
Hemoglobin: 13.9 g/dL (ref 13.0–17.0)
Immature Granulocytes: 2 %
Lymphocytes Relative: 5 %
Lymphs Abs: 0.7 K/uL (ref 0.7–4.0)
MCH: 31.5 pg (ref 26.0–34.0)
MCHC: 31.9 g/dL (ref 30.0–36.0)
MCV: 98.9 fL (ref 80.0–100.0)
Monocytes Absolute: 0.8 K/uL (ref 0.1–1.0)
Monocytes Relative: 6 %
Neutro Abs: 11.8 K/uL — ABNORMAL HIGH (ref 1.7–7.7)
Neutrophils Relative %: 87 %
Platelets: 221 K/uL (ref 150–400)
RBC: 4.41 MIL/uL (ref 4.22–5.81)
RDW: 13.5 % (ref 11.5–15.5)
WBC: 13.5 K/uL — ABNORMAL HIGH (ref 4.0–10.5)
nRBC: 0 % (ref 0.0–0.2)

## 2024-03-03 LAB — TROPONIN I (HIGH SENSITIVITY): Troponin I (High Sensitivity): 17 ng/L (ref <18)

## 2024-03-03 LAB — BRAIN NATRIURETIC PEPTIDE: B Natriuretic Peptide: 237.6 pg/mL — ABNORMAL HIGH (ref 0.0–100.0)

## 2024-03-03 MED ORDER — SODIUM CHLORIDE 0.9 % IV BOLUS
500.0000 mL | Freq: Once | INTRAVENOUS | Status: AC
  Administered 2024-03-03: 500 mL via INTRAVENOUS

## 2024-03-03 MED ORDER — BUPROPION HCL ER (SR) 150 MG PO TB12
150.0000 mg | ORAL_TABLET | Freq: Two times a day (BID) | ORAL | Status: DC
  Administered 2024-03-04 – 2024-03-05 (×3): 150 mg via ORAL
  Filled 2024-03-03 (×4): qty 1

## 2024-03-03 MED ORDER — ATORVASTATIN CALCIUM 40 MG PO TABS
40.0000 mg | ORAL_TABLET | Freq: Every day | ORAL | Status: DC
  Administered 2024-03-04 – 2024-03-05 (×2): 40 mg via ORAL
  Filled 2024-03-03: qty 4
  Filled 2024-03-03: qty 1

## 2024-03-03 MED ORDER — MAGNESIUM SULFATE 2 GM/50ML IV SOLN
2.0000 g | Freq: Once | INTRAVENOUS | Status: AC
  Administered 2024-03-03: 2 g via INTRAVENOUS
  Filled 2024-03-03: qty 50

## 2024-03-03 MED ORDER — BUDESON-GLYCOPYRROL-FORMOTEROL 160-9-4.8 MCG/ACT IN AERO
2.0000 | INHALATION_SPRAY | Freq: Two times a day (BID) | RESPIRATORY_TRACT | Status: DC
  Administered 2024-03-04: 2 via RESPIRATORY_TRACT
  Filled 2024-03-03 (×2): qty 5.9

## 2024-03-03 MED ORDER — IPRATROPIUM-ALBUTEROL 0.5-2.5 (3) MG/3ML IN SOLN
3.0000 mL | Freq: Four times a day (QID) | RESPIRATORY_TRACT | Status: DC
  Administered 2024-03-04 – 2024-03-05 (×5): 3 mL via RESPIRATORY_TRACT
  Filled 2024-03-03 (×5): qty 3

## 2024-03-03 MED ORDER — LORAZEPAM 1 MG PO TABS
1.0000 mg | ORAL_TABLET | Freq: Once | ORAL | Status: AC
  Administered 2024-03-03: 1 mg via ORAL
  Filled 2024-03-03: qty 1

## 2024-03-03 MED ORDER — AZITHROMYCIN 250 MG PO TABS
500.0000 mg | ORAL_TABLET | Freq: Every day | ORAL | Status: DC
  Administered 2024-03-04 – 2024-03-05 (×2): 500 mg via ORAL
  Filled 2024-03-03 (×2): qty 2

## 2024-03-03 MED ORDER — ALBUTEROL SULFATE (2.5 MG/3ML) 0.083% IN NEBU
10.0000 mg | INHALATION_SOLUTION | Freq: Once | RESPIRATORY_TRACT | Status: AC
  Administered 2024-03-03: 10 mg via RESPIRATORY_TRACT
  Filled 2024-03-03: qty 12

## 2024-03-03 MED ORDER — APIXABAN 5 MG PO TABS
5.0000 mg | ORAL_TABLET | Freq: Two times a day (BID) | ORAL | Status: DC
  Administered 2024-03-04 – 2024-03-05 (×3): 5 mg via ORAL
  Filled 2024-03-03 (×3): qty 1

## 2024-03-03 MED ORDER — HYDROXYZINE HCL 25 MG PO TABS
25.0000 mg | ORAL_TABLET | Freq: Three times a day (TID) | ORAL | Status: DC | PRN
  Administered 2024-03-04 – 2024-03-05 (×2): 25 mg via ORAL
  Filled 2024-03-03 (×2): qty 1

## 2024-03-03 MED ORDER — METHYLPREDNISOLONE SODIUM SUCC 125 MG IJ SOLR
125.0000 mg | Freq: Once | INTRAMUSCULAR | Status: AC
  Administered 2024-03-03: 125 mg via INTRAVENOUS
  Filled 2024-03-03: qty 2

## 2024-03-03 MED ORDER — METOPROLOL TARTRATE 25 MG PO TABS
25.0000 mg | ORAL_TABLET | Freq: Two times a day (BID) | ORAL | Status: DC
  Administered 2024-03-04 – 2024-03-05 (×4): 25 mg via ORAL
  Filled 2024-03-03 (×4): qty 1

## 2024-03-03 MED ORDER — DILTIAZEM HCL ER COATED BEADS 120 MG PO CP24
120.0000 mg | ORAL_CAPSULE | Freq: Every day | ORAL | Status: DC
  Administered 2024-03-04 – 2024-03-05 (×2): 120 mg via ORAL
  Filled 2024-03-03 (×3): qty 1

## 2024-03-03 MED ORDER — PREDNISONE 10 MG PO TABS
40.0000 mg | ORAL_TABLET | Freq: Every day | ORAL | Status: DC
  Administered 2024-03-04: 40 mg via ORAL
  Filled 2024-03-03: qty 2

## 2024-03-03 NOTE — ED Triage Notes (Signed)
 Patient from home BIB [facility] in respiratory distress for 1 hour now. EMS placed patient on a duoneb, has had 3 in total, now getting straight albuterol . Reports dry cough. Recently prescribed Prednisone  and taking it. Was here 2 days ago placed on BIPAP and subsequently d/ced.  BP-170/100 HR 108 ST Spo2-97 %  and w/ decreased lung sounds.    Hx of COPD

## 2024-03-03 NOTE — ED Provider Notes (Signed)
 [place] EMERGENCY DEPARTMENT AT [facility] Provider Note  CSN: [id number] Arrival date & time: 03/03/24 2120  Chief Complaint(s) Respiratory Distress  HPI Francisco [name] is a 63 y.o. male with past medical history as below, significant for atrial flutter, COPD, CHF, home oxygen  dependence 2 L, prior IV drug abuse who presents to the ED with complaint of dyspnea  Patient history of COPD, 2 L home oxygen , he was seen in the ER recently, discharged in stable condition.  Patient returns today is progressive worsening of his symptoms.  On EMS arrival he was hypoxic on 2 L, upper 80s.  Increased work of breathing.  He received multiple nebulized breathing treatments and route to the hospital with minimal improvement.  Patient denies fevers, does report mild worsening to his chronic cough.  He does smoke.  Unable to ambulate more than a few feet without becoming severely dyspneic at home on his typical oxygen .     Past Medical History Past Medical History:  Diagnosis Date   Atrial flutter with rapid ventricular response (HCC) 08/14/2023   CHF (congestive heart failure) (HCC)    COPD (chronic obstructive pulmonary disease) (HCC)    COPD with acute exacerbation (HCC) 10/21/2021   Coronary artery disease    a. s/p prior PCI.   Hepatitis-C    History of CHF (congestive heart failure) 01/28/2024   History of stroke 02/13/2023   Hypertension    IV drug abuse (HCC)    a. 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   Medically noncompliant    MI (myocardial infarction) (HCC)    TIA (transient ischemic attack) 02/14/2023   Tobacco abuse    Patient Active Problem List   Diagnosis Date Noted   COPD, frequent exacerbations (HCC) 03/02/2024   Chest pain 02/28/2024   Acute respiratory failure with hypoxia (HCC) 02/28/2024   COPD exacerbation (HCC) 02/28/2024   Acute on chronic hypoxic respiratory failure (HCC) 10/30/2023   Paroxysmal A-fib (HCC) 08/13/2023   COPD with acute  exacerbation (HCC) 08/13/2023   Seasonal allergies 07/29/2023   Hyperlipidemia 04/29/2023   Benzodiazepine causing adverse effect in therapeutic use 02/14/2023   Pulmonary hypertension (HCC) 02/05/2023   Fatigue 09/28/2022   Right knee pain 08/26/2022   Weight loss 02/19/2022   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 Authorizing Provider  albuterol  (PROVENTIL ) (2.5 MG/3ML) 0.083% nebulizer solution Take 2.5 mg by nebulization every 6 (six) hours as needed for wheezing or shortness of breath.    [provider]  albuterol  (VENTOLIN  HFA) 108 (90 Base) MCG/ACT inhaler Inhale 1-2 puffs into the lungs every 6 (six) hours as needed for wheezing or shortness of breath. 02/15/24   Jerral Meth, MD  Albuterol -Budesonide  90-80 MCG/ACT AERO Inhale 2 puffs into the lungs every 4 (four) hours as needed (wheeze, shortness of breath). 01/23/24   Hunsucker, [name], MD  apixaban  (ELIQUIS ) 5 MG TABS tablet Take 1 tablet (5 mg total) by mouth 2 (two) times daily. 02/01/24   Marylu Gee, DO  atorvastatin  (LIPITOR ) 40 MG tablet Take 1 tablet (40 mg total) by mouth daily. 02/01/24  Marylu Gee, DO  azithromycin  (ZITHROMAX ) 500 MG tablet Take 1 tablet (500 mg total) by mouth daily. 03/03/24   Syeda, Raeeha, DO  benzonatate  (TESSALON ) 200 MG capsule Take 1 capsule (200 mg total) by mouth 3 (three) times daily as needed for cough. 02/01/24   Amilibia, Jaden, DO  buPROPion (WELLBUTRIN SR) 150 MG 12 hr tablet Take 1 tablet (150 mg total) by mouth 2 (two) times daily.  Take 1 tablet once daily for the first 3 days (on 10/15-10/17), then take one tablet twice daily (10/18 and onward) 02/01/24   Amilibia, Jaden, DO  diltiazem  (CARDIZEM  CD) 120 MG 24 hr capsule Take 1 capsule (120 mg total) by mouth daily. 02/01/24   Marylu Gee, DO  Dupilumab  (DUPIXENT ) 300 MG/2ML SOAJ Inject 300 mg into the skin every 14 (fourteen) days. Patient taking differently: Inject 300 mg into the skin See admin instructions. Inject 2mL (300mg ) into the skin every other Monday. 01/26/24   Hunsucker, [name], MD  fluticasone  (FLONASE ) 50 MCG/ACT nasal spray Place 1 spray into both nostrils daily. Patient taking differently: Place 1 spray into both nostrils daily as needed for rhinitis or allergies. 01/25/24   [name], [name], DO  Fluticasone -Umeclidin-Vilant (TRELEGY ELLIPTA ) 100-62.5-25 MCG/ACT AEPB Inhale 1 puff into the lungs daily. 11/21/23 03/05/24  Marylu Gee, DO  guaiFENesin -dextromethorphan  (ROBITUSSIN DM) 100-10 MG/5ML syrup Take 15 mLs by mouth 4 (four) times daily. Patient not taking: Reported on 03/02/2024 02/01/24   Amilibia, Jaden, DO  hydrOXYzine  (ATARAX ) 25 MG tablet Take 25 mg by mouth 3 (three) times daily as needed for anxiety.    [provider]  ipratropium-albuterol  (DUONEB) 0.5-2.5 (3) MG/3ML SOLN Take 3 mLs by nebulization every 4 (four) hours as needed. 02/28/24   Amoako, Prince, MD  loratadine  (CLARITIN ) 10 MG tablet Take 1 tablet (10 mg total) by mouth daily. 11/28/23 11/27/24  [name], [name], DO  losartan  (COZAAR ) 50 MG tablet Take 1 tablet (50 mg total) by mouth daily. 02/01/24   Marylu Gee, DO  metoprolol  tartrate (LOPRESSOR ) 25 MG tablet Take 1 tablet (25 mg total) by mouth 2 (two) times daily. 02/01/24   Marylu Gee, DO  nicotine  (NICODERM CQ  - DOSED IN MG/24 HOURS) 21 mg/24hr patch Place 1 patch (21 mg total) onto the skin daily. 02/28/24 02/27/25  Renne Homans, MD  nitroGLYCERIN  (NITROSTAT ) 0.4 MG SL tablet Place 1 tablet (0.4 mg total)  under the tongue every 5 (five) minutes x 3 doses as needed for chest pain. 11/17/23   [name], [name], DO  OXYGEN  Inhale 2-4 L/min into the lungs See admin instructions. Inhale 2 L/min continuously (baseline). May increase up to 4 L/min if needed for poor oxygen  saturation or exertion.    [provider]  predniSONE  (DELTASONE ) 50 MG tablet Take 1 tablet (50 mg total) by mouth daily for 4 days. 03/02/24 03/06/24  Syeda, Raeeha, DO  thiamine  (VITAMIN B-1) 100 MG tablet Take 1 tablet (100 mg total) by mouth daily. 08/15/23   Krishnan, Gokul, MD  Past Surgical History Past Surgical History:  Procedure Laterality Date   BACK SURGERY     NECK SURGERY     RIGHT/LEFT HEART CATH AND CORONARY ANGIOGRAPHY N/A 04/30/2019   Procedure: RIGHT/LEFT HEART CATH AND CORONARY ANGIOGRAPHY;  Surgeon: Anner Alm ORN, MD;  Location: [facility] INVASIVE CV LAB;  Service: Cardiovascular;  Laterality: N/A;   Family History Family History  Problem Relation Age of Onset   Rheum arthritis Mother    CVA Father    Heart disease Paternal Grandfather     Social History Social History   Tobacco Use   Smoking status: Every Day    Current packs/day: 0.50    Types: Cigarettes    Passive exposure: Current   Smokeless tobacco: Never   Tobacco comments:    Half a pack a day as of 12/07/2023  Vaping Use   Vaping status: Never Used  Substance Use Topics   Alcohol use: Never   Drug use: Yes    Frequency: 2.0 times per week    Types: Methamphetamines    Comment: active, has been to rehab. has been to [facility] (did not work). Prev heroin.   Allergies Ivp dye [iodinated contrast media], Ultram [tramadol], and Vibramycin  [doxycycline ]  Review of Systems A thorough review of systems was obtained and all systems are negative except as noted in the HPI and PMH.   Physical Exam Vital Signs  I  have reviewed the triage vital signs BP (!) 208/113   Pulse 86   Resp 18   Ht 6' (1.829 m)   Wt 90 kg   SpO2 99%   BMI 26.91 kg/m  Physical Exam Vitals and nursing note reviewed.  Constitutional:      General: He is in acute distress.     Appearance: He is well-developed. He is ill-appearing and diaphoretic.  HENT:     Head: Normocephalic and atraumatic.     Right Ear: External ear normal.     Left Ear: External ear normal.     Mouth/Throat:     Mouth: Mucous membranes are moist.  Eyes:     General: No scleral icterus. Cardiovascular:     Rate and Rhythm: Regular rhythm. Tachycardia present.     Pulses: Normal pulses.     Heart sounds: Normal heart sounds.  Pulmonary:     Effort: Tachypnea, accessory muscle usage and respiratory distress present.     Breath sounds: Decreased air movement present. Wheezing present.  Abdominal:     General: Abdomen is flat.     Palpations: Abdomen is soft.     Tenderness: There is no abdominal tenderness.  Musculoskeletal:     Cervical back: No rigidity.     Right lower leg: No edema.     Left lower leg: No edema.  Skin:    General: Skin is warm.     Capillary Refill: Capillary refill takes less than 2 seconds.  Neurological:     Mental Status: He is alert.  Psychiatric:        Mood and Affect: Mood normal.        Behavior: Behavior normal.     ED Results and Treatments Labs (all labs ordered are listed, but only abnormal results are displayed) Labs Reviewed  BASIC METABOLIC PANEL WITH GFR - Abnormal; Notable for the following components:      Result Value   Glucose, Bld 137 (*)    Calcium  8.6 (*)    All other components within normal limits  CBC WITH  DIFFERENTIAL/PLATELET - Abnormal; Notable for the following components:   WBC 13.5 (*)    Neutro Abs 11.8 (*)    Abs Immature Granulocytes 0.23 (*)    All other components within normal limits  BRAIN NATRIURETIC PEPTIDE - Abnormal; Notable for the following components:   B  Natriuretic Peptide 237.6 (*)    All other components within normal limits  I-STAT VENOUS BLOOD GAS, ED - Abnormal; Notable for the following components:   pH, Ven 7.462 (*)    pO2, Ven 81 (*)    Bicarbonate 33.6 (*)    TCO2 35 (*)    Acid-Base Excess 8.0 (*)    Calcium , Ion 1.05 (*)    All other components within normal limits  RESP PANEL BY RT-PCR (RSV, FLU A&B, COVID)  [id number]  BASIC METABOLIC PANEL WITH GFR  CBC  TROPONIN I (HIGH SENSITIVITY)  TROPONIN I (HIGH SENSITIVITY)                                                                                                                          Radiology DG Chest Port 1 View Result Date: 03/03/2024 CLINICAL DATA:  Respiratory distress with dry cough. EXAM: PORTABLE CHEST 1 VIEW COMPARISON:  March 01, 2024 FINDINGS: The heart size and mediastinal contours are within normal limits. Both lungs are clear. The visualized skeletal structures are unremarkable. IMPRESSION: No active disease. Electronically Signed   By: Suzen Dials M.D.   On: 03/03/2024 21:47    Pertinent labs & imaging results that were available during my care of the patient were reviewed by me and considered in my medical decision making (see MDM for details).  Medications Ordered in ED Medications  apixaban  (ELIQUIS ) tablet 5 mg (has no administration in time range)  atorvastatin  (LIPITOR ) tablet 40 mg (has no administration in time range)  buPROPion (WELLBUTRIN SR) 12 hr tablet 150 mg (has no administration in time range)  diltiazem  (CARDIZEM  CD) 24 hr capsule 120 mg (has no administration in time range)  budesonide -glycopyrrolate -formoterol  (BREZTRI ) 160-9-4.8 MCG/ACT inhaler 2 puff (0 puffs Inhalation Hold 03/04/24 0004)  hydrOXYzine  (ATARAX ) tablet 25 mg (has no administration in time range)  metoprolol  tartrate (LOPRESSOR ) tablet 25 mg (has no administration in time range)  ipratropium-albuterol  (DUONEB) 0.5-2.5 (3) MG/3ML nebulizer solution 3 mL (has no  administration in time range)  predniSONE  (DELTASONE ) tablet 40 mg (has no administration in time range)  azithromycin  (ZITHROMAX ) tablet 500 mg (has no administration in time range)  nicotine  (NICODERM CQ  - dosed in mg/24 hours) patch 21 mg (has no administration in time range)  LORazepam  (ATIVAN ) tablet 1 mg (1 mg Oral Given 03/03/24 2147)  methylPREDNISolone  sodium succinate (SOLU-MEDROL ) 125 mg/2 mL injection 125 mg (125 mg Intravenous Given 03/03/24 2148)  magnesium  sulfate IVPB 2 g 50 mL (0 g Intravenous Stopped 03/03/24 2259)  albuterol  (PROVENTIL ) (2.5 MG/3ML) 0.083% nebulizer solution 10 mg (10 mg Nebulization Given 03/03/24 2135)  sodium chloride  0.9 % bolus 500 mL (0  mLs Intravenous Stopped 03/03/24 2337)                                                                                                                                     Procedures .Critical Care  Performed by: Elnor Jayson LABOR, DO Authorized by: Elnor Jayson LABOR, DO   Critical care provider statement:    Critical care time (minutes):  50   Critical care time was exclusive of:  Separately billable procedures and treating other patients   Critical care was necessary to treat or prevent imminent or life-threatening deterioration of the following conditions:  Respiratory failure   Critical care was time spent personally by me on the following activities:  Development of treatment plan with patient or surrogate, discussions with consultants, evaluation of patient's response to treatment, examination of patient, ordering and review of laboratory studies, ordering and review of radiographic studies, ordering and performing treatments and interventions, pulse oximetry, re-evaluation of patient's condition, review of old charts and obtaining history from patient or surrogate   Care discussed with: admitting provider     (including critical care time)  Medical Decision Making / ED Course    Medical Decision Making:     Wasim Hurlbut is a 63 y.o. male  with past medical history as below, significant for atrial flutter, COPD, CHF, home oxygen  dependence 2 L, prior IV drug abuse who presents to the ED with complaint of dyspnea. The complaint involves an extensive differential diagnosis and also carries with it a high risk of complications and morbidity.  Serious etiology was considered. Ddx includes but is not limited to: In my evaluation of this patient's dyspnea my DDx includes, but is not limited to, pneumonia, pulmonary embolism, pneumothorax, pulmonary edema, metabolic acidosis, asthma, COPD, cardiac cause, anemia, anxiety, etc.    Complete initial physical exam performed, notably the patient was in acute respiratory distress.    Reviewed and confirmed nursing documentation for past medical history, family history, social history.  Vital signs reviewed.    COPD exacerbation Acute on chronic respiratory failure> - Acute distress on arrival, hypoxia, accessory muscle use, poor air movement. - Patient was started on 1 hour 10 mg, BiPAP, given Solu-Medrol , mag sulfate, fluids. - Symptoms have been improving. - labs stable - vbg stable - cxr stable - Patient with multiple ER visits over the past 2 weeks, history of prior intubation.  Progressive worsening of symptoms over the past few days.  Plan to admit for the above. Pt in agreement                       Additional history obtained: -Additional history obtained from ems -External records from outside source obtained and reviewed including: Chart review including previous notes, labs, imaging, consultation notes including  Prior er eval Home meds   Lab Tests: -I ordered, reviewed, and interpreted labs.   The pertinent results include:  Labs Reviewed  BASIC METABOLIC PANEL WITH GFR - Abnormal; Notable for the following components:      Result Value   Glucose, Bld 137 (*)    Calcium  8.6 (*)    All other components within normal  limits  CBC WITH DIFFERENTIAL/PLATELET - Abnormal; Notable for the following components:   WBC 13.5 (*)    Neutro Abs 11.8 (*)    Abs Immature Granulocytes 0.23 (*)    All other components within normal limits  BRAIN NATRIURETIC PEPTIDE - Abnormal; Notable for the following components:   B Natriuretic Peptide 237.6 (*)    All other components within normal limits  I-STAT VENOUS BLOOD GAS, ED - Abnormal; Notable for the following components:   pH, Ven 7.462 (*)    pO2, Ven 81 (*)    Bicarbonate 33.6 (*)    TCO2 35 (*)    Acid-Base Excess 8.0 (*)    Calcium , Ion 1.05 (*)    All other components within normal limits  RESP PANEL BY RT-PCR (RSV, FLU A&B, COVID)  [id number]  BASIC METABOLIC PANEL WITH GFR  CBC  TROPONIN I (HIGH SENSITIVITY)  TROPONIN I (HIGH SENSITIVITY)    Notable for as above  EKG   EKG Interpretation Date/Time:  Saturday March 03 2024 21:29:35 EST Ventricular Rate:  100 PR Interval:  142 QRS Duration:  81 QT Interval:  321 QTC Calculation: 414 R Axis:   65  Text Interpretation: Sinus tachycardia Ventricular premature complex Aberrant conduction of SV complex(es) Consider right atrial enlargement Borderline repol abnormality, diffuse leads Interpretation limited secondary to artifact Confirmed by Elnor Savant ([id number]) on 03/03/2024 9:46:39 PM         Imaging Studies ordered: I ordered imaging studies including cxr I independently visualized the following imaging with scope of interpretation limited to determining acute life threatening conditions related to emergency care; findings noted above I agree with the radiologist interpretation If any imaging was obtained with contrast I closely monitored patient for any possible adverse reaction a/w contrast administration in the emergency department   Medicines ordered and prescription drug management: Meds ordered this encounter  Medications   LORazepam  (ATIVAN ) tablet 1 mg   methylPREDNISolone  sodium  succinate (SOLU-MEDROL ) 125 mg/2 mL injection 125 mg   magnesium  sulfate IVPB 2 g 50 mL   albuterol  (PROVENTIL ) (2.5 MG/3ML) 0.083% nebulizer solution 10 mg   sodium chloride  0.9 % bolus 500 mL   apixaban  (ELIQUIS ) tablet 5 mg   atorvastatin  (LIPITOR ) tablet 40 mg   buPROPion (WELLBUTRIN SR) 12 hr tablet 150 mg   diltiazem  (CARDIZEM  CD) 24 hr capsule 120 mg   budesonide -glycopyrrolate -formoterol  (BREZTRI ) 160-9-4.8 MCG/ACT inhaler 2 puff   hydrOXYzine  (ATARAX ) tablet 25 mg   metoprolol  tartrate (LOPRESSOR ) tablet 25 mg   ipratropium-albuterol  (DUONEB) 0.5-2.5 (3) MG/3ML nebulizer solution 3 mL   predniSONE  (DELTASONE ) tablet 40 mg   azithromycin  (ZITHROMAX ) tablet 500 mg   nicotine  (NICODERM CQ  - dosed in mg/24 hours) patch 21 mg    -I have reviewed the patients home medicines and have made adjustments as needed   Consultations Obtained: I requested consultation with the hospitalist,  and discussed lab and imaging findings as well as pertinent plan    Cardiac Monitoring: The patient was maintained on a cardiac monitor.  I personally viewed and interpreted the cardiac monitored which showed an underlying rhythm of: nsr Continuous pulse oximetry interpreted by myself, 88% on 2L   Social Determinants of Health:  Diagnosis or treatment significantly limited by  social determinants of health: current smoker and polysubstance abuse Counseled patient for approximately 3 minutes regarding smoking cessation. Discussed risks of smoking and how they applied and affected their visit here today. Patient not ready to quit at this time, however will follow up with their primary doctor when they are.   CPT code: 00593: intermediate counseling for smoking cessation     Reevaluation: After the interventions noted above, I reevaluated the patient and found that they have improved  Co morbidities that complicate the patient evaluation  Past Medical History:  Diagnosis Date   Atrial flutter  with rapid ventricular response (HCC) 08/14/2023   CHF (congestive heart failure) (HCC)    COPD (chronic obstructive pulmonary disease) (HCC)    COPD with acute exacerbation (HCC) 10/21/2021   Coronary artery disease    a. s/p prior PCI.   Hepatitis-C    History of CHF (congestive heart failure) 01/28/2024   History of stroke 02/13/2023   Hypertension    IV drug abuse (HCC)    a. 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   Medically noncompliant    MI (myocardial infarction) (HCC)    TIA (transient ischemic attack) 02/14/2023   Tobacco abuse       Dispostion: Disposition decision including need for hospitalization was considered, and patient admitted to the hospital.    Final Clinical Impression(s) / ED Diagnoses Final diagnoses:  COPD exacerbation (HCC)  Respiratory distress  Acute respiratory failure with hypoxia (HCC)        Elnor Jayson LABOR, DO 03/04/24 0022

## 2024-03-04 DIAGNOSIS — I5032 Chronic diastolic (congestive) heart failure: Secondary | ICD-10-CM | POA: Diagnosis present

## 2024-03-04 DIAGNOSIS — I4811 Longstanding persistent atrial fibrillation: Secondary | ICD-10-CM | POA: Diagnosis present

## 2024-03-04 DIAGNOSIS — F419 Anxiety disorder, unspecified: Secondary | ICD-10-CM | POA: Diagnosis present

## 2024-03-04 DIAGNOSIS — Z720 Tobacco use: Secondary | ICD-10-CM

## 2024-03-04 DIAGNOSIS — Z7951 Long term (current) use of inhaled steroids: Secondary | ICD-10-CM | POA: Diagnosis not present

## 2024-03-04 DIAGNOSIS — I420 Dilated cardiomyopathy: Secondary | ICD-10-CM | POA: Diagnosis present

## 2024-03-04 DIAGNOSIS — I272 Pulmonary hypertension, unspecified: Secondary | ICD-10-CM | POA: Diagnosis present

## 2024-03-04 DIAGNOSIS — I2510 Atherosclerotic heart disease of native coronary artery without angina pectoris: Secondary | ICD-10-CM | POA: Diagnosis present

## 2024-03-04 DIAGNOSIS — Z608 Other problems related to social environment: Secondary | ICD-10-CM | POA: Diagnosis present

## 2024-03-04 DIAGNOSIS — I110 Hypertensive heart disease with heart failure: Secondary | ICD-10-CM | POA: Diagnosis present

## 2024-03-04 DIAGNOSIS — Z881 Allergy status to other antibiotic agents status: Secondary | ICD-10-CM | POA: Diagnosis not present

## 2024-03-04 DIAGNOSIS — F17210 Nicotine dependence, cigarettes, uncomplicated: Secondary | ICD-10-CM | POA: Diagnosis not present

## 2024-03-04 DIAGNOSIS — Z885 Allergy status to narcotic agent status: Secondary | ICD-10-CM | POA: Diagnosis not present

## 2024-03-04 DIAGNOSIS — B1920 Unspecified viral hepatitis C without hepatic coma: Secondary | ICD-10-CM | POA: Diagnosis present

## 2024-03-04 DIAGNOSIS — Z7952 Long term (current) use of systemic steroids: Secondary | ICD-10-CM | POA: Diagnosis not present

## 2024-03-04 DIAGNOSIS — J441 Chronic obstructive pulmonary disease with (acute) exacerbation: Secondary | ICD-10-CM | POA: Diagnosis not present

## 2024-03-04 DIAGNOSIS — J9622 Acute and chronic respiratory failure with hypercapnia: Secondary | ICD-10-CM

## 2024-03-04 DIAGNOSIS — Z823 Family history of stroke: Secondary | ICD-10-CM | POA: Diagnosis not present

## 2024-03-04 DIAGNOSIS — Z79899 Other long term (current) drug therapy: Secondary | ICD-10-CM | POA: Diagnosis not present

## 2024-03-04 DIAGNOSIS — Z955 Presence of coronary angioplasty implant and graft: Secondary | ICD-10-CM | POA: Diagnosis not present

## 2024-03-04 DIAGNOSIS — I252 Old myocardial infarction: Secondary | ICD-10-CM | POA: Diagnosis not present

## 2024-03-04 DIAGNOSIS — Z8249 Family history of ischemic heart disease and other diseases of the circulatory system: Secondary | ICD-10-CM | POA: Diagnosis not present

## 2024-03-04 DIAGNOSIS — J9621 Acute and chronic respiratory failure with hypoxia: Secondary | ICD-10-CM | POA: Diagnosis not present

## 2024-03-04 DIAGNOSIS — J9601 Acute respiratory failure with hypoxia: Secondary | ICD-10-CM | POA: Diagnosis not present

## 2024-03-04 DIAGNOSIS — Z91041 Radiographic dye allergy status: Secondary | ICD-10-CM | POA: Diagnosis not present

## 2024-03-04 DIAGNOSIS — Z9981 Dependence on supplemental oxygen: Secondary | ICD-10-CM | POA: Diagnosis not present

## 2024-03-04 DIAGNOSIS — E785 Hyperlipidemia, unspecified: Secondary | ICD-10-CM | POA: Diagnosis present

## 2024-03-04 DIAGNOSIS — Z7901 Long term (current) use of anticoagulants: Secondary | ICD-10-CM | POA: Diagnosis not present

## 2024-03-04 DIAGNOSIS — R0603 Acute respiratory distress: Secondary | ICD-10-CM | POA: Diagnosis present

## 2024-03-04 LAB — CBC
HCT: 44.9 % (ref 39.0–52.0)
Hemoglobin: 14.2 g/dL (ref 13.0–17.0)
MCH: 31.6 pg (ref 26.0–34.0)
MCHC: 31.6 g/dL (ref 30.0–36.0)
MCV: 100 fL (ref 80.0–100.0)
Platelets: 151 K/uL (ref 150–400)
RBC: 4.49 MIL/uL (ref 4.22–5.81)
RDW: 13.6 % (ref 11.5–15.5)
WBC: 13 K/uL — ABNORMAL HIGH (ref 4.0–10.5)
nRBC: 0 % (ref 0.0–0.2)

## 2024-03-04 LAB — BASIC METABOLIC PANEL WITH GFR
Anion gap: 10 (ref 5–15)
BUN: 19 mg/dL (ref 8–23)
CO2: 27 mmol/L (ref 22–32)
Calcium: 8.4 mg/dL — ABNORMAL LOW (ref 8.9–10.3)
Chloride: 102 mmol/L (ref 98–111)
Creatinine, Ser: 1.13 mg/dL (ref 0.61–1.24)
GFR, Estimated: >60 mL/min (ref >60)
Glucose, Bld: 134 mg/dL — ABNORMAL HIGH (ref 70–99)
Potassium: 5 mmol/L (ref 3.5–5.1)
Sodium: 139 mmol/L (ref 135–145)

## 2024-03-04 LAB — RESP PANEL BY RT-PCR (RSV, FLU A&B, COVID)  RVPGX2
Influenza A by PCR: NEGATIVE
Influenza B by PCR: NEGATIVE
Resp Syncytial Virus by PCR: NEGATIVE
SARS Coronavirus 2 by RT PCR: NEGATIVE

## 2024-03-04 LAB — TROPONIN I (HIGH SENSITIVITY): Troponin I (High Sensitivity): 15 ng/L (ref <18)

## 2024-03-04 MED ORDER — ALBUTEROL SULFATE (2.5 MG/3ML) 0.083% IN NEBU
2.5000 mg | INHALATION_SOLUTION | Freq: Four times a day (QID) | RESPIRATORY_TRACT | Status: DC | PRN
  Administered 2024-03-04: 2.5 mg via RESPIRATORY_TRACT
  Filled 2024-03-04: qty 3

## 2024-03-04 MED ORDER — PREDNISONE 10 MG PO TABS: 30.0000 mg | ORAL_TABLET | Freq: Every day | ORAL | Status: DC

## 2024-03-04 MED ORDER — BUDESONIDE 0.25 MG/2ML IN SUSP
0.2500 mg | Freq: Two times a day (BID) | RESPIRATORY_TRACT | Status: DC
  Administered 2024-03-04 – 2024-03-05 (×2): 0.25 mg via RESPIRATORY_TRACT
  Filled 2024-03-04 (×2): qty 2

## 2024-03-04 MED ORDER — PREDNISONE 20 MG PO TABS: 30.0000 mg | ORAL_TABLET | Freq: Every day | ORAL | Status: DC

## 2024-03-04 MED ORDER — ARFORMOTEROL TARTRATE 15 MCG/2ML IN NEBU
15.0000 ug | INHALATION_SOLUTION | Freq: Two times a day (BID) | RESPIRATORY_TRACT | Status: DC
  Administered 2024-03-04 – 2024-03-05 (×2): 15 ug via RESPIRATORY_TRACT
  Filled 2024-03-04 (×2): qty 2

## 2024-03-04 MED ORDER — PREDNISONE 10 MG PO TABS: 40.0000 mg | ORAL_TABLET | Freq: Every day | ORAL | Status: DC

## 2024-03-04 MED ORDER — PREDNISONE 10 MG PO TABS: 10.0000 mg | ORAL_TABLET | Freq: Every day | ORAL | Status: DC

## 2024-03-04 MED ORDER — PREDNISONE 10 MG PO TABS: 20.0000 mg | ORAL_TABLET | Freq: Every day | ORAL | Status: DC

## 2024-03-04 MED ORDER — PREDNISONE 10 MG PO TABS: 50.0000 mg | ORAL_TABLET | Freq: Every day | ORAL | Status: DC

## 2024-03-04 MED ORDER — NICOTINE 21 MG/24HR TD PT24
21.0000 mg | MEDICATED_PATCH | Freq: Every day | TRANSDERMAL | Status: DC
  Administered 2024-03-04 – 2024-03-05 (×2): 21 mg via TRANSDERMAL
  Filled 2024-03-04 (×2): qty 1

## 2024-03-04 MED ORDER — ALBUTEROL SULFATE (2.5 MG/3ML) 0.083% IN NEBU
2.5000 mg | INHALATION_SOLUTION | RESPIRATORY_TRACT | Status: DC | PRN
  Administered 2024-03-05: 2.5 mg via RESPIRATORY_TRACT
  Filled 2024-03-04: qty 3

## 2024-03-04 MED ORDER — PREDNISONE 20 MG PO TABS: 20.0000 mg | ORAL_TABLET | Freq: Every day | ORAL | Status: DC

## 2024-03-04 MED ORDER — PREDNISONE 20 MG PO TABS
40.0000 mg | ORAL_TABLET | Freq: Every day | ORAL | Status: DC
  Administered 2024-03-05: 40 mg via ORAL
  Filled 2024-03-04 (×2): qty 2

## 2024-03-04 NOTE — Progress Notes (Signed)
 RN notified MD about patient's 2 albuterol  inhalers, RN explained to patient that, it needs to be in the order or med list, patient refused to put albuterol  away. MD spoke to patient.

## 2024-03-04 NOTE — Consult Note (Signed)
 NAME:  Francisco [name], MRN:  [id number], DOB:  1960-04-21, LOS: 0 ADMISSION DATE:  03/03/2024, CONSULTATION DATE:  03/04/24 REFERRING MD:  Chyrl Fenton, MD CHIEF COMPLAINT:  COPD   History of Present Illness:  63 year old male active smoker (1/2 ppd) with COPD, chronic hypoxemic respiratory failure on 2L at home, CHF, CAD s/p PCI, HTN, MI, hepatitis C, hx IVDU and multiple ED and hospitalizations who presents for COPD exacerbation. Last ED/observation visit on 03/02/24 for COPD. Presents with worsening shortness of breath despite nebulizer treatments. Found in the ED with SpO2 80s with increased WOB. Treated with BiPAP, solumedrol, Mag and IVF with improvement. CXR with no infiltrate effusion or edema. He reports significant SOB with walking short distances including to the bathroom with wheezing and cough. Feels better on BiPAP. Not on CPAP or BiPAP at home. Sleep study in 12/2022 neg.  Followed by Dr. Annella at [facility] with last visit on 01/23/24. Has been on Dupixent  x 3 months without improvement but plan to trial an additional three months until end of January 2026. Was prescribed Airsupra at that time. Discussion commented on needing daily prednisone  if symptoms not improving. Since that time he has had 4 admissions and 5 ED visits. Patient has history of not picking up medications  No pulmonary function tests available  Significant Hospital Events: Including procedures, antibiotic start and stop dates in addition to other pertinent events   11/16 Admitted to IMTS for COPD exacerbation  Interim History / Subjective:  As above  Objective    Blood pressure (!) 146/82, pulse 92, temperature 98.5 F (36.9 C), temperature source Oral, resp. rate 20, height 6' (1.829 m), weight 90 kg, SpO2 94%.    FiO2 (%):  [40 %] 40 % PEEP:  [5 cmH20] 5 cmH20 Pressure Support:  [5 cmH20] 5 cmH20   Intake/Output Summary (Last 24 hours) at 03/04/2024 1251 Last data filed at 03/04/2024  0744 Gross per 24 hour  Intake 550 ml  Output 300 ml  Net 250 ml   Filed Weights   03/03/24 2124  Weight: 90 kg   Physical Exam: General: Well-appearing, no acute distress HENT: [place], AT Eyes: EOMI, no scleral icterus Respiratory: Diminished but clear to auscultation bilaterally.  No crackles, wheezing or rales Cardiovascular: RRR, -M/R/G, no JVD Extremities:-Edema,-tenderness Neuro: AAO x4, CNII-XII grossly intact Psych: Normal mood, normal affect   Latest Reference Range & Units 12/25/23 01:36 01/27/24 03:22 01/28/24 20:09 01/30/24 18:27 02/26/24 08:10 02/26/24 08:11 02/27/24 22:00 03/01/24 06:57 03/03/24 21:38  Sample type  VENOUS VENOUS    VENOUS VENOUS ARTERIAL VENOUS  pH, Arterial 7.35 - 7.45         7.333 (L)   pCO2 arterial 32 - 48 mmHg        59.2 (H)   pO2, Arterial 83 - 108 mmHg        158 (H)   pH, Ven 7.25 - 7.43  7.304 7.448 (H)  7.33  7.397 7.382  7.462 (H)  pCO2, Ven 44 - 60 mmHg 77.0 (HH) 45.4  69 (H)  56.2 58.3  47.1  pO2, Ven 32 - 45 mmHg 36 89 (H)  46 (H)  62 (H) 52 (H)  81 (H)  TCO2 22 - 32 mmol/L 41 (H) 33 (H) 37 (H)  32 36 (H) 36 (H) 33 (H) 34 (H) 35 (H)  Acid-Base Excess 0.0 - 2.0 mmol/L 8.0 (H) 6.0 (H)  7.1 (H)  8.0 (H) 8.0 (H) 4.0 (H) 8.0 (H)  Bicarbonate 20.0 - 28.0 mmol/L 38.3 (H) 31.5 (H)  36.1 (H)  34.6 (H) 34.7 (H) 31.7 (H) 33.6 (H)  O2 Saturation % 60 97  84.2  91 84 99 96  Patient temperature     36.6    97.4 F   Collection site     [facility]    RADIAL, ALLEN'S TEST ACCEPTABLE   (HH): Data is critically high (L): Data is abnormally low (H): Data is abnormally high    Assessment and Plan   COPD exacerbation Outpatient regimen: Trelegy 100 and Dupixent . Dupixent  has not improved symptoms but advised to continue 6 months total (end of Jan 2026). Not controlled on baseline regimen P: -While inpatient agree with nebs, steroids and azithromycin  -Hold triple inhaler -BiPAP nightly and as needed. Primary team to have CM arrange to obtain BiPAP  prior to discharge -Recommend slow prednisone  taper (50 mg x 4d, 40 mg x 4d etc) -Nebulizers: Scheduled Duonebs TID, Pulmicort  BID and Brovana  BID. Pending clinical response, may need to consider discharging on nebulizer therapy if stable. Primary team to investigate covered options.  -If there is a plan to discharge on inhalers, recommend increasing Trelegy to 200 mcg strength -PT/OT. Recommend pulmonary rehab as outpatient  Acute on chronic hypoxemic and hypercarbic respiratory failure [name] chronic respiratory failure due to COPD is life threatening.  Previous ABG's have documented high PCO2. Patient continues to exhibit signs of hypercapnia associated with chronic respiratory failures secondary to severe COPD. Patient requires the use of NIV both QHS and daytime to help with exacerbation periods. The use of the NIV will treat patient's high PCO2 levels and can reduce risk of exacerbations and future hospitalizations when used at nigh and during the day. Patient will need this advanced settings in conjunction with her current medication regimen -Wean O2 to baseline settings -Continue BiPAP nightly and as needed -Please arrange for BiPAP prior to discharge  Tobacco abuse -Counseled on smoking cessation -Agree with Wellbutrin -Nicotine  patch  Pulmonary will continue to follow  Labs   CBC: Recent Labs  Lab 02/27/24 2144 02/27/24 2200 03/01/24 0645 03/01/24 0657 03/01/24 2256 03/02/24 0453 03/03/24 2136 03/03/24 2138 03/04/24 0146  WBC 12.0*   < > 14.3*  --  10.8* 10.5 13.5*  --  13.0*  NEUTROABS 9.1*  --  9.5*  --  9.8*  --  11.8*  --   --   HGB 13.6   < > 14.2   < > 13.9 12.9* 13.9 14.3 14.2  HCT 42.5   < > 44.0   < > 42.9 40.0 43.6 42.0 44.9  MCV 97.3   < > 99.1  --  96.8 97.1 98.9  --  100.0  PLT 197   < > 193  --  222 202 221  --  151   < > = values in this interval not displayed.    Basic Metabolic Panel: Recent Labs  Lab 03/01/24 0645 03/01/24 0657  03/01/24 2256 03/02/24 0453 03/03/24 2136 03/03/24 2138 03/04/24 0146  NA 138   < > 143 144 140 140 139  K 4.0   < > 4.4 4.1 4.8 4.8 5.0  CL 100  --  100 102 103  --  102  CO2 29  --  29 32 29  --  27  GLUCOSE 90  --  268* 191* 137*  --  134*  BUN 22  --  27* 22 22  --  19  CREATININE 1.21  --  1.33*  1.27* 1.21  --  1.13  CALCIUM  8.7*  --  9.0 8.7* 8.6*  --  8.4*   < > = values in this interval not displayed.   GFR: Estimated Creatinine Clearance: 73.4 mL/min (by C-G formula based on SCr of 1.13 mg/dL). Recent Labs  Lab 03/01/24 2256 03/02/24 0453 03/03/24 2136 03/04/24 0146  WBC 10.8* 10.5 13.5* 13.0*    Liver Function Tests: No results for input(s): AST, ALT, ALKPHOS, BILITOT, PROT, ALBUMIN  in the last 168 hours. No results for input(s): LIPASE, AMYLASE in the last 168 hours. No results for input(s): AMMONIA in the last 168 hours.  ABG    Component Value Date/Time   PHART 7.333 (L) 03/01/2024 0657   PCO2ART 59.2 (H) 03/01/2024 0657   PO2ART 158 (H) 03/01/2024 0657   HCO3 33.6 (H) 03/03/2024 2138   TCO2 35 (H) 03/03/2024 2138   O2SAT 96 03/03/2024 2138     Coagulation Profile: No results for input(s): INR, PROTIME in the last 168 hours.  Cardiac Enzymes: No results for input(s): CKTOTAL, CKMB, CKMBINDEX, TROPONINI in the last 168 hours.  HbA1C: Hgb A1c MFr Bld  Date/Time Value Ref Range Status  08/13/2023 05:47 AM 5.8 (H) 4.8 - 5.6 % Final    Comment:    (NOTE) Pre diabetes:          5.7%-6.4%  Diabetes:              >6.4%  Glycemic control for   <7.0% adults with diabetes   02/13/2023 08:20 PM 6.2 (H) 4.8 - 5.6 % Final    Comment:    (NOTE) Pre diabetes:          5.7%-6.4%  Diabetes:              >6.4%  Glycemic control for   <7.0% adults with diabetes     CBG: No results for input(s): GLUCAP in the last 168 hours.  Review of Systems:   Review of Systems  Constitutional:  Negative for chills, diaphoresis,  fever, malaise/fatigue and weight loss.  HENT:  Negative for congestion.   Respiratory:  Positive for shortness of breath. Negative for cough, hemoptysis, sputum production and wheezing.   Cardiovascular:  Negative for chest pain, palpitations and leg swelling.     Past Medical History:  He,  has a past medical history of Atrial flutter with rapid ventricular response (HCC) (08/14/2023), CHF (congestive heart failure) (HCC), COPD (chronic obstructive pulmonary disease) (HCC), COPD with acute exacerbation (HCC) (10/21/2021), Coronary artery disease, Hepatitis-C, History of CHF (congestive heart failure) (01/28/2024), History of stroke (02/13/2023), Hypertension, IV drug abuse (HCC), Medically noncompliant, MI (myocardial infarction) (HCC), TIA (transient ischemic attack) (02/14/2023), and Tobacco abuse.   Surgical History:   Past Surgical History:  Procedure Laterality Date   BACK SURGERY     NECK SURGERY     RIGHT/LEFT HEART CATH AND CORONARY ANGIOGRAPHY N/A 04/30/2019   Procedure: RIGHT/LEFT HEART CATH AND CORONARY ANGIOGRAPHY;  Surgeon: Anner Alm ORN, MD;  Location: [facility] INVASIVE CV LAB;  Service: Cardiovascular;  Laterality: N/A;     Social History:   reports that he has been smoking cigarettes. He has been exposed to tobacco smoke. He has never used smokeless tobacco. He reports current drug use. Frequency: 2.00 times per week. Drug: Methamphetamines. He reports that he does not drink alcohol.   Family History:  His family history includes CVA in his father; Heart disease in his paternal grandfather; Rheum arthritis in his mother.   Allergies  Allergies  Allergen Reactions   Ivp Dye [Iodinated Contrast Media] Other (See Comments)    Seizures    Ultram [Tramadol] Other (See Comments)    Seizures    Vibramycin  [Doxycycline ] Other (See Comments)    Burning sensation to his hands.     Home Medications  Prior to Admission medications   Medication Sig Start Date End Date  Taking? Authorizing Provider  albuterol  (PROVENTIL ) (2.5 MG/3ML) 0.083% nebulizer solution Take 2.5 mg by nebulization every 6 (six) hours as needed for wheezing or shortness of breath.    [provider]  albuterol  (VENTOLIN  HFA) 108 (90 Base) MCG/ACT inhaler Inhale 1-2 puffs into the lungs every 6 (six) hours as needed for wheezing or shortness of breath. 02/15/24   Jerral Meth, MD  Albuterol -Budesonide  90-80 MCG/ACT AERO Inhale 2 puffs into the lungs every 4 (four) hours as needed (wheeze, shortness of breath). 01/23/24   Hunsucker, [name], MD  apixaban  (ELIQUIS ) 5 MG TABS tablet Take 1 tablet (5 mg total) by mouth 2 (two) times daily. 02/01/24   Marylu Gee, DO  atorvastatin  (LIPITOR ) 40 MG tablet Take 1 tablet (40 mg total) by mouth daily. 02/01/24   Marylu Gee, DO  azithromycin  (ZITHROMAX ) 500 MG tablet Take 1 tablet (500 mg total) by mouth daily. 03/03/24   Syeda, Raeeha, DO  benzonatate  (TESSALON ) 200 MG capsule Take 1 capsule (200 mg total) by mouth 3 (three) times daily as needed for cough. 02/01/24   Amilibia, Jaden, DO  buPROPion (WELLBUTRIN SR) 150 MG 12 hr tablet Take 1 tablet (150 mg total) by mouth 2 (two) times daily. Take 1 tablet once daily for the first 3 days (on 10/15-10/17), then take one tablet twice daily (10/18 and onward) 02/01/24   Amilibia, Jaden, DO  diltiazem  (CARDIZEM  CD) 120 MG 24 hr capsule Take 1 capsule (120 mg total) by mouth daily. 02/01/24   Marylu Gee, DO  Dupilumab  (DUPIXENT ) 300 MG/2ML SOAJ Inject 300 mg into the skin every 14 (fourteen) days. Patient taking differently: Inject 300 mg into the skin See admin instructions. Inject 2mL (300mg ) into the skin every other Monday. 01/26/24   Hunsucker, [name], MD  fluticasone  (FLONASE ) 50 MCG/ACT nasal spray Place 1 spray into both nostrils daily. Patient taking differently: Place 1 spray into both nostrils daily as needed for rhinitis or allergies. 01/25/24   [name], [name], DO   Fluticasone -Umeclidin-Vilant (TRELEGY ELLIPTA ) 100-62.5-25 MCG/ACT AEPB Inhale 1 puff into the lungs daily. 11/21/23 03/05/24  Marylu Gee, DO  guaiFENesin -dextromethorphan  (ROBITUSSIN DM) 100-10 MG/5ML syrup Take 15 mLs by mouth 4 (four) times daily. Patient not taking: Reported on 03/02/2024 02/01/24   Amilibia, Jaden, DO  hydrOXYzine  (ATARAX ) 25 MG tablet Take 25 mg by mouth 3 (three) times daily as needed for anxiety.    [provider]  ipratropium-albuterol  (DUONEB) 0.5-2.5 (3) MG/3ML SOLN Take 3 mLs by nebulization every 4 (four) hours as needed. 02/28/24   Renne Homans, MD  loratadine  (CLARITIN ) 10 MG tablet Take 1 tablet (10 mg total) by mouth daily. 11/28/23 11/27/24  [name], [name], DO  losartan  (COZAAR ) 50 MG tablet Take 1 tablet (50 mg total) by mouth daily. 02/01/24   Marylu Gee, DO  metoprolol  tartrate (LOPRESSOR ) 25 MG tablet Take 1 tablet (25 mg total) by mouth 2 (two) times daily. 02/01/24   Marylu Gee, DO  nicotine  (NICODERM CQ  - DOSED IN MG/24 HOURS) 21 mg/24hr patch Place 1 patch (21 mg total) onto the skin daily. 02/28/24 02/27/25  Renne Homans,  MD  nitroGLYCERIN  (NITROSTAT ) 0.4 MG SL tablet Place 1 tablet (0.4 mg total) under the tongue every 5 (five) minutes x 3 doses as needed for chest pain. 11/17/23   [name], [name], DO  OXYGEN  Inhale 2-4 L/min into the lungs See admin instructions. Inhale 2 L/min continuously (baseline). May increase up to 4 L/min if needed for poor oxygen  saturation or exertion.    [provider]  predniSONE  (DELTASONE ) 50 MG tablet Take 1 tablet (50 mg total) by mouth daily for 4 days. 03/02/24 03/06/24  Syeda, Raeeha, DO  thiamine  (VITAMIN B-1) 100 MG tablet Take 1 tablet (100 mg total) by mouth daily. 08/15/23   Verdene Purchase, MD     Critical care time: N/A    I have spent a total time of 60-minutes including chart review, data review, collecting history, coordinating care and discussing medical diagnosis and plan  with the patient and updating team. Past medical history, allergies, medications were reviewed. Pertinent imaging, labs and tests included in this note have been reviewed and interpreted independently by me.  Slater Staff, M.D. [facility] Pulmonary/Critical Care Medicine 03/04/2024 12:51 PM   See Amion for personal pager For hours between 7 PM to 7 AM, please call Elink for urgent questions

## 2024-03-04 NOTE — Evaluation (Signed)
 Physical Therapy Evaluation & Discharge Patient Details Name: Francisco [name] MRN: [id number] DOB: Jun 14, 1960 Today's Date: 03/04/2024  History of Present Illness  Patient is a 63 yo male who presented 03/03/24 with SOB. Admitted with COPD exacerbation. Pt with multiple recent ED visits/hospitalizations for the same over the past month. PMH includes CHF, COPD, CAD s/p PCI, HTN, MI, Hep-C, tobacco use, IVDA (previously heroin, methamphetamine).   Clinical Impression  Pt presents with condition above. PTA, he was independent without DME, living with a roommate in a 1-level house with 6 STE. He reports he has been having difficulty ambulating to/from the kitchen, standing for showers, and does not leave the house often due to his pulmonary endurance deficits. His recent shower stool is too large for his tub/shower, even after trying various ways to rearrange it. He is requesting a HH aide to assist him with completing showers and other household tasks. At this time, he displays [facility] strength and balance and is mobilizing independently, yet fatigues quickly due to his pulmonary endurance deficits. His SpO2 was >/= 90% and up to 96% on 1-3L when resting and when on 2-3L when ambulating this session. Provided pt with [employer] handout that also reviews breathing techniques and exercises. Educated pt to count his breathing in and out and monitor his sats via pulse ox at home when he becomes anxious or SOB to try to get it under control. Educated pt on a walking program and increasing his frequency of activity as able. He verbalized understanding. The pt is not needing skilled physical therapy at this time, but could benefit from Pulmonary Rehab follow-up if he can get transportation to/from it. All education completed and questions answered. PT will sign off. Thank you for this referral.      If plan is discharge home, recommend the following: Assist for transportation;Assistance with cooking/housework    Can travel by private vehicle        Equipment Recommendations Other (comment) (very narrow shower stool (recent one is too big for his tub/shower))  Recommendations for Other Services       Functional Status Assessment Patient has had a recent decline in their functional status and demonstrates the ability to make significant improvements in function in a reasonable and predictable amount of time.     Precautions / Restrictions Precautions Precautions: Other (comment) Precaution/Restrictions Comments: watch O2, on 3L baseline Restrictions Weight Bearing Restrictions Per Provider Order: No      Mobility  Bed Mobility Overal bed mobility: Modified Independent             General bed mobility comments: HOB elevated, no assistance needed    Transfers Overall transfer level: Independent Equipment used: None               General transfer comment: No LOB, no assistance needed    Ambulation/Gait Ambulation/Gait assistance: Modified independent (Device/Increase time) Gait Distance (Feet): 120 Feet (x3 bouts of ~120 ft > ~50 ft) Assistive device: None Gait Pattern/deviations: Step-through pattern, Decreased stride length, Trunk flexed Gait velocity: mildly reduced Gait velocity interpretation: >2.62 ft/sec, indicative of community ambulatory   General Gait Details: Mildly flexed posture and decreased gait speed, but no overt LOB.  Stairs            Wheelchair Mobility     Tilt Bed    Modified Rankin (Stroke Patients Only)       Balance Overall balance assessment: No apparent balance deficits (not formally assessed)  Pertinent Vitals/Pain Pain Assessment Pain Assessment: Faces Faces Pain Scale: No hurt Pain Intervention(s): Monitored during session    Home Living Family/patient expects to be discharged to:: Private residence Living Arrangements: Non-relatives/Friends Available  Help at Discharge: Friend(s);Available PRN/intermittently Type of Home: House Home Access: Stairs to enter Entrance Stairs-Rails: Right;Left;Can reach both Entrance Stairs-Number of Steps: 6   Home Layout: One level Home Equipment: Other (comment);Shower seat (home O2; shower chair does not fit his tub/shower)      Prior Function Prior Level of Function : Independent/Modified Independent             Mobility Comments: No AD, does not leave home much, limited household mobility due to endurance/pulmonary deficits ADLs Comments: having issues being able to stand long enough to shower lately, but is independent; does not drive     Extremity/Trunk Assessment   Upper Extremity Assessment Upper Extremity Assessment: Overall WFL for tasks assessed    Lower Extremity Assessment Lower Extremity Assessment: Overall WFL for tasks assessed    Cervical / Trunk Assessment Cervical / Trunk Assessment: Normal  Communication   Communication Communication: No apparent difficulties    Cognition Arousal: Alert Behavior During Therapy: WFL for tasks assessed/performed   PT - Cognitive impairments: No apparent impairments                       PT - Cognition Comments: understandably frustrated about his current medical situation Following commands: Intact       Cueing Cueing Techniques: Verbal cues     General Comments General comments (skin integrity, edema, etc.): SpO2 >/= 90% and up to 96% on 1-3L resting, 2-3L when ambulating; Provided pt with [employer] handout that also reviews breathing techniques and exercises. Educated pt to count his breathing in and out and monitor his sats via pulse ox at home when he becomes anxious or SOB to try to get it under control. Educated pt on a walking program and increasing his frequency of activity as able. He verbalized understanding.    Exercises     Assessment/Plan    PT Assessment All further PT needs can be met in  the next venue of care  PT Problem List Decreased activity tolerance;Decreased mobility;Cardiopulmonary status limiting activity       PT Treatment Interventions      PT Goals (Current goals can be found in the Care Plan section)  Acute Rehab PT Goals Patient Stated Goal: to reduce hospitalizations and improve his breathing PT Goal Formulation: All assessment and education complete, DC therapy Time For Goal Achievement: 03/05/24 Potential to Achieve Goals: Good    Frequency       Co-evaluation               AM-PAC PT 6 Clicks Mobility  Outcome Measure Help needed turning from your back to your side while in a flat bed without using bedrails?: None Help needed moving from lying on your back to sitting on the side of a flat bed without using bedrails?: None Help needed moving to and from a bed to a chair (including a wheelchair)?: None Help needed standing up from a chair using your arms (e.g., wheelchair or bedside chair)?: None Help needed to walk in hospital room?: None Help needed climbing 3-5 steps with a railing? : None 6 Click Score: 24    End of Session Equipment Utilized During Treatment: Oxygen  Activity Tolerance: Patient tolerated treatment well Patient left: in bed;with call bell/phone within reach Nurse  Communication: Mobility status;Other (comment) (pt wanting breathing treatment) PT Visit Diagnosis: Difficulty in walking, not elsewhere classified (R26.2);Other (comment) (endurance deficits)    Time: 0802-0820 PT Time Calculation (min) (ACUTE ONLY): 18 min   Charges:   PT Evaluation $PT Eval Low Complexity: 1 Low   PT General Charges $$ ACUTE PT VISIT: 1 Visit         Theo Ferretti, PT, DPT Acute Rehabilitation Services  Office: [phone]   Theo CHRISTELLA Ferretti 03/04/2024, 8:31 AM

## 2024-03-04 NOTE — H&P (Incomplete)
 Date: 03/04/2024               Patient Name:  Francisco [name] MRN: [id number]  DOB: Mar 03, 1961 Age / Sex: 63 y.o., male   PCP: Heddy Barren, DO         Medical Service: Internal Medicine Teaching Service         Attending Physician: Dr. Eben Reyes BROCKS, MD      First Contact: Dr. Rebecka Pion    Second Contact: Dr. Drue Mocha         After Hours (After 5p/  First Contact Pager: [phone]  weekends / holidays): Second Contact Pager: [phone]   SUBJECTIVE   Chief Complaint: shortness of breath   History of Present Illness:   [name] is a 63 year old gentleman with past medical history of    ED Course:  Meds:  No outpatient medications have been marked as taking for the 03/03/24 encounter [facility] Encounter).    Past Medical History  Past Surgical History:  Procedure Laterality Date  . BACK SURGERY    . NECK SURGERY    . RIGHT/LEFT HEART CATH AND CORONARY ANGIOGRAPHY N/A 04/30/2019   Procedure: RIGHT/LEFT HEART CATH AND CORONARY ANGIOGRAPHY;  Surgeon: Anner Alm ORN, MD;  Location: [facility] INVASIVE CV LAB;  Service: Cardiovascular;  Laterality: N/A;    Social:  Lives With: Occupation: Support: Level of Function: PCP: Substances:  Family History: ***  Allergies: Allergies as of 03/03/2024 - Review Complete 03/03/2024  Allergen Reaction Noted  . Ivp dye [iodinated contrast media] Other (See Comments) 06/12/2018  . Ultram [tramadol] Other (See Comments) 06/12/2018  . Vibramycin  [doxycycline ] Other (See Comments) 02/01/2023    Review of Systems: A complete ROS was negative except as per HPI.   OBJECTIVE:   Physical Exam: Blood pressure (!) 208/113, pulse 86, resp. rate 18, height 6' (1.829 m), weight 90 kg, SpO2 99%.  Constitutional: well-appearing *** sitting in ***, in no acute distress HENT: normocephalic atraumatic, mucous membranes moist Eyes: conjunctiva non-erythematous Neck: supple Cardiovascular: regular rate and rhythm, no  m/r/g Pulmonary/Chest: normal work of breathing on room air, lungs clear to auscultation bilaterally Abdominal: soft, non-tender, non-distended MSK: normal bulk and tone Neurological: alert & oriented x 3, 5/5 strength in bilateral upper and lower extremities, normal gait Skin: warm and dry Psych: ***  Labs: CBC    Component Value Date/Time   WBC 13.5 (H) 03/03/2024 2136   RBC 4.41 03/03/2024 2136   HGB 14.3 03/03/2024 2138   HGB 15.5 12/06/2023 1528   HCT 42.0 03/03/2024 2138   HCT 47.1 12/06/2023 1528   PLT 221 03/03/2024 2136   PLT 285 12/06/2023 1528   MCV 98.9 03/03/2024 2136   MCV 97 12/06/2023 1528   MCH 31.5 03/03/2024 2136   MCHC 31.9 03/03/2024 2136   RDW 13.5 03/03/2024 2136   RDW 13.6 12/06/2023 1528   LYMPHSABS 0.7 03/03/2024 2136   LYMPHSABS 1.7 05/18/2021 1518   MONOABS 0.8 03/03/2024 2136   EOSABS 0.0 03/03/2024 2136   EOSABS 0.0 05/18/2021 1518   BASOSABS 0.1 03/03/2024 2136   BASOSABS 0.0 05/18/2021 1518     CMP     Component Value Date/Time   NA 140 03/03/2024 2138   NA 144 12/06/2023 1528   K 4.8 03/03/2024 2138   CL 103 03/03/2024 2136   CO2 29 03/03/2024 2136   GLUCOSE 137 (H) 03/03/2024 2136   BUN 22 03/03/2024 2136   BUN 9 12/06/2023 1528   CREATININE  1.21 03/03/2024 2136   CALCIUM  8.6 (L) 03/03/2024 2136   PROT 6.3 (L) 02/26/2024 0938   PROT 6.9 12/06/2023 1528   ALBUMIN  3.5 02/26/2024 0938   ALBUMIN  4.1 12/06/2023 1528   AST 29 02/26/2024 0938   ALT 46 (H) 02/26/2024 0938   ALKPHOS 61 02/26/2024 0938   BILITOT 1.2 02/26/2024 0938   BILITOT 0.5 12/06/2023 1528   GFRNONAA >60 03/03/2024 2136   GFRAA >60 01/11/2020 0422    Imaging:  EKG: personally reviewed my interpretation is***. Prior EKG***  ASSESSMENT & PLAN:   Assessment & Plan by Problem: Active Problems:   COPD with acute exacerbation (HCC)   Francisco [name] is a 63 y.o. person living with a history of *** who presented with *** and admitted for *** on hospital day  0  *** ***  *** ***  *** ***  Diet: {NAMES:3044014::Normal,Heart Healthy,Carb-Modified,Renal,Carb/Renal,NPO,TPN,Tube Feeds} VTE: {NAMES:3044014::Heparin ,Enoxaparin ,SCDs,DOAC,None} IVF: {NAMES:3044014::None,NS,1/2 NS,LR,D5,D10},{NAMES:3044014::None,10cc/hr,25cc/hr,50cc/hr,75cc/hr,100cc/hr,110cc/hr,125cc/hr,Bolus} Code: {NAMES:3044014::Full,DNR,DNI,DNR/DNI,Comfort Care,Unknown}  Prior to Admission Living Arrangement: {NAMES:3044014::Home, living ***,SNF, ***,Homeless,***} Anticipated Discharge Location: {NAMES:3044014::Home,SNF,CIR,***} Barriers to Discharge: ***  Dispo: Admit patient to {STATUS:3044014::Observation with expected length of stay less than 2 midnights.,Inpatient with expected length of stay greater than 2 midnights.}  Signed: Prapti Grussing, MD Internal Medicine Resident PGY-3  03/04/2024, 12:00 AM

## 2024-03-04 NOTE — ED Notes (Signed)
 Bipap discontinued and 3L Francisco [name] placed on pt. Provider made aware.

## 2024-03-04 NOTE — Hospital Course (Signed)
 COPD Exacerbation The patient presented with acute worsening dyspnea, increased work of breathing, and hypoxia. In the ED he required BiPAP with improvement. He was treated with IV steroids, nebulized bronchodilators, magnesium , azithromycin , and scheduled DuoNebs. CXR showed no acute disease. VBG was consistent with chronic compensated hypercapnia. His symptoms improved with therapy, and he was transitioned to prednisone  and nebulizers. Pulmonology recommended increasing Trelegy to 200 mcg and starting BiPAP nightly and as needed. He returned to baseline oxygen  of 2 L.  Acute on Chronic Hypoxic Respiratory Failure He required BiPAP due to increased respiratory distress and hypercapnia. After stabilization, oxygen  was weaned to his baseline 2 L. Pulmonology confirmed the need for home BiPAP for nighttime use and daytime naps. Care management arranged equipment delivery prior to discharge.  Severe Pulmonary Hypertension History of severe PAH on prior catheterization. No evidence of acute decompensation during admission. No volume overload. He remained hemodynamically stable with preserved EF. Continued guideline-directed therapy and oxygen  support.  Paroxysmal Atrial Fibrillation No episodes of rapid ventricular response during this admission. Continued home Eliquis , metoprolol , and diltiazem . Rate remained controlled throughout the stay.  Dilated Cardiomyopathy with Recovered EF Past EF improved to 60-65 percent. He remained euvolemic with no signs of HF exacerbation. Continued home GDMT including losartan , metoprolol , and statin.  Coronary Artery Disease (s/p PCI) No ischemic symptoms. Serial troponins were normal. EKG remained without acute changes. Continued atorvastatin  and nitroglycerin  as needed.   Hypertension Blood pressure was elevated on arrival, likely due to respiratory distress. Improved with respiratory stabilization. Continued home regimen of losartan , metoprolol , and  diltiazem .  Tobacco Use Disorder He was counseled on smoking cessation. Continued nicotine  patch and Wellbutrin. Reinforced the importance of quitting to reduce COPD exacerbations.  Medication Access and Adherence He reported difficulty picking up azithromycin  prior to this admission. All medications were clarified, and he was counseled on completing antibiotic and steroid taper. Confirmed he will receive BiPAP equipment and medications prior to discharge.  Substance Use (Intermittent IV Methamphetamine) He disclosed weekly use. Discussed risks related to COPD and heart disease. Offered resources for cessation.

## 2024-03-04 NOTE — Plan of Care (Signed)
   Problem: Activity: Goal: Risk for activity intolerance will decrease Outcome: Progressing   Problem: Nutrition: Goal: Adequate nutrition will be maintained Outcome: Progressing

## 2024-03-04 NOTE — H&P (Addendum)
 Date: 03/04/2024               Patient Name:  Francisco [name] MRN: [id number]  DOB: Nov 01, 1960 Age / Sex: 63 y.o., male   PCP: Heddy Barren, DO         Medical Service: Internal Medicine Teaching Service         Attending Physician: Dr. Eben Reyes BROCKS, MD      First Contact: Dr. Rebecka Pion    Second Contact: Dr. Drue Grow         After Hours (After 5p/  First Contact Pager: [phone]  weekends / holidays): Second Contact Pager: [phone]   SUBJECTIVE   Chief Complaint: shortness of breath   History of Present Illness:   [name] is a 63 year old gentleman with past medical history of severe COPD, chronic hypoxic respiratory failure on 2 L of home oxygen , significant pulmonary hypertension, paroxysmal A-fib on Eliquis , heart failure with recovered ejection fraction, coronary artery disease status post stent placement, tobacco use disorder who is presenting from his house via EMS for shortness of breath.  Patient was recently discharged from the service on March 02, 2024 for COPD exacerbation.  Patient went home, and was doing well Friday night, however noticed this morning that walking anywhere from his bedroom to even his kitchen he was feeling short of breath and had to stop and catch his breath.  He was walking to the shower and became severely short of breath, he also felt like he was having an anxiety attack at that time as well, and called EMS.  He was unable to pick up the azithromycin  from the pharmacy, however was able to pick up the steroids.  He is still smoking about half a pack a day.   Meds:  Ventolin  inhaler PRN AirSupra PRN Trelegy Dupixent  q14 days Prednisone  taper DuoNebs PRN Eliquis  5 mg BID Diltiazem  120 mg daily Metoprolol  tartrate 25 mg BID Losartan  50 mg daily Atorvastatin  40 mg daily Bupropion SR Loratadine  Flonase  Guaifenesin  Sublingual nitroglycerin  PRN 2 L home oxygen     Past Medical History Severe COPD Heart failure with  recovered ejection fraction Coronary artery disease status post PCI placement Tobacco use disorder Paroxysmal A-fib  Past Surgical History:  Procedure Laterality Date   BACK SURGERY     NECK SURGERY     RIGHT/LEFT HEART CATH AND CORONARY ANGIOGRAPHY N/A 04/30/2019   Procedure: RIGHT/LEFT HEART CATH AND CORONARY ANGIOGRAPHY;  Surgeon: Anner Alm ORN, MD;  Location: [facility] INVASIVE CV LAB;  Service: Cardiovascular;  Laterality: N/A;    Social:  Lives With: Roommate Occupation: On disability Support: No family in the area, has very minimal support Level of Function: Independent in his ADLs PCP: Dr. Barren Heddy DO Substances: Uses about half a pack a day, and does use IV methamphetamine once a week which she declined using last night  Family History:  Father-CVA  Allergies: Allergies as of 03/03/2024 - Review Complete 03/03/2024  Allergen Reaction Noted   Ivp dye [iodinated contrast media] Other (See Comments) 06/12/2018   Ultram [tramadol] Other (See Comments) 06/12/2018   Vibramycin  [doxycycline ] Other (See Comments) 02/01/2023    Review of Systems: A complete ROS was negative except as per HPI.   OBJECTIVE:   Physical Exam: Blood pressure (!) 208/113, pulse 86, resp. rate 18, height 6' (1.829 m), weight 90 kg, SpO2 99%.  Constitutional: No acute distress, has BiPAP on, able to speak in full sentences HENT: normocephalic atraumatic, mucous membranes moist Eyes:  conjunctiva non-erythematous Neck: supple Cardiovascular: regular rate and rhythm, no m/r/g Pulmonary/Chest: On BiPAP, decreased air movement throughout lung field, mild wheezing heard, able to speak in full sentences Abdominal: soft, non-tender, non-distended MSK: normal bulk and tone Neurological: alert & oriented x 3, 5/5 strength in bilateral upper and lower extremities,  Skin: warm and dry Psych: Normal mood and affect  Labs: CBC    Component Value Date/Time   WBC 13.5 (H) 03/03/2024 2136   RBC  4.41 03/03/2024 2136   HGB 14.3 03/03/2024 2138   HGB 15.5 12/06/2023 1528   HCT 42.0 03/03/2024 2138   HCT 47.1 12/06/2023 1528   PLT 221 03/03/2024 2136   PLT 285 12/06/2023 1528   MCV 98.9 03/03/2024 2136   MCV 97 12/06/2023 1528   MCH 31.5 03/03/2024 2136   MCHC 31.9 03/03/2024 2136   RDW 13.5 03/03/2024 2136   RDW 13.6 12/06/2023 1528   LYMPHSABS 0.7 03/03/2024 2136   LYMPHSABS 1.7 05/18/2021 1518   MONOABS 0.8 03/03/2024 2136   EOSABS 0.0 03/03/2024 2136   EOSABS 0.0 05/18/2021 1518   BASOSABS 0.1 03/03/2024 2136   BASOSABS 0.0 05/18/2021 1518     CMP     Component Value Date/Time   NA 140 03/03/2024 2138   NA 144 12/06/2023 1528   K 4.8 03/03/2024 2138   CL 103 03/03/2024 2136   CO2 29 03/03/2024 2136   GLUCOSE 137 (H) 03/03/2024 2136   BUN 22 03/03/2024 2136   BUN 9 12/06/2023 1528   CREATININE 1.21 03/03/2024 2136   CALCIUM  8.6 (L) 03/03/2024 2136   PROT 6.3 (L) 02/26/2024 0938   PROT 6.9 12/06/2023 1528   ALBUMIN  3.5 02/26/2024 0938   ALBUMIN  4.1 12/06/2023 1528   AST 29 02/26/2024 0938   ALT 46 (H) 02/26/2024 0938   ALKPHOS 61 02/26/2024 0938   BILITOT 1.2 02/26/2024 0938   BILITOT 0.5 12/06/2023 1528   GFRNONAA >60 03/03/2024 2136   GFRAA >60 01/11/2020 0422    Imaging: Chest x-ray: No active cardiopulmonary disease  EKG: personally reviewed my interpretation is sinus tachycardia, much artifact present.   ASSESSMENT & PLAN:   Assessment & Plan by Problem: Active Problems:   COPD with acute exacerbation (HCC)   Francisco [name] is a 63 year old male with severe COPD, chronic hypoxic respiratory failure, tobacco and meth use, PAH, and heart disease who presents with worsening dyspnea, consistent with a COPD exacerbation.   #COPD Exacerbation #Acute on Chronic Hypoxic Respiratory Failure #Severe Pulmonary HTN Since September 2025, this will be patient's ninth visit to the emergency department/hospital admission to the hospital for COPD  exacerbation.  He has been compliant with his medications, including his inhalers.  He has an appointment with pulmonology scheduled for November 19 with Dr. Annella.  His VBG is reassuring, white blood cell count shows very mild leukocytosis however he has been on steroids.  Will treat with antibiotics and nebulizer treatments, he is already improving.  I did discuss cessation of tobacco use, patient has tried Chantix, nicotine  gum, and patches however nothing really seem to help.  His echo in April 2025 showed normal pulmonary artery systolic pressure, he did have a cath done in 2021 which showed elevated pulmonary artery diastolic and right atrial pressures, indicating severe pulmonary hypertension, however at this time his ejection fraction was 25%.  Plan: - Prednisone  40 mg for 5 days - Azithromycin  500 mg for 3 days - DuoNeb treatment scheduled every 6 hours -Physical therapy   #Essential  Hypertension Continuing his losartan , diltiazem , metoprolol .  Elevated at this time due to severe respiratory distress most likely  # Paroxysmal atrial fibrillation Continuing his Eliquis  5 mg twice a day, diltiazem , metoprolol   # Dilated Cardiomyopathy, now recovered EF Prior EF 25-30% (2021); now 60-65% (2025). No volume overload. Plan: - Continue home GDMT - Monitor volume status  # CAD s/p PCI Chest symptoms non-cardiac and resolved. Plan: - Continue atorvastatin  40 mg daily  # Tobacco Use Disorder Active smoker. Plan: - Continue nicotine  patch - Smoking cessation counseling  # HLD - Continue statin.   Diet: Normal VTE: DOAC IVF: None,None Code: Full  Prior to Admission Living Arrangement: Home, living with roommate Anticipated Discharge Location: Home Barriers to Discharge: Medical management  Dispo: Admit patient to Observation with expected length of stay less than 2 midnights.  Signed: Jacoby Ritsema, MD Internal Medicine Resident PGY-3  03/04/2024, 12:00 AM

## 2024-03-05 ENCOUNTER — Other Ambulatory Visit (HOSPITAL_COMMUNITY): Payer: Self-pay

## 2024-03-05 ENCOUNTER — Telehealth: Payer: Self-pay

## 2024-03-05 ENCOUNTER — Other Ambulatory Visit: Payer: Self-pay

## 2024-03-05 ENCOUNTER — Telehealth (HOSPITAL_COMMUNITY): Payer: Self-pay

## 2024-03-05 DIAGNOSIS — J441 Chronic obstructive pulmonary disease with (acute) exacerbation: Secondary | ICD-10-CM

## 2024-03-05 DIAGNOSIS — J9601 Acute respiratory failure with hypoxia: Secondary | ICD-10-CM

## 2024-03-05 DIAGNOSIS — Z7952 Long term (current) use of systemic steroids: Secondary | ICD-10-CM

## 2024-03-05 DIAGNOSIS — Z7951 Long term (current) use of inhaled steroids: Secondary | ICD-10-CM | POA: Diagnosis not present

## 2024-03-05 DIAGNOSIS — F17210 Nicotine dependence, cigarettes, uncomplicated: Secondary | ICD-10-CM | POA: Diagnosis not present

## 2024-03-05 DIAGNOSIS — Z9981 Dependence on supplemental oxygen: Secondary | ICD-10-CM

## 2024-03-05 DIAGNOSIS — F17200 Nicotine dependence, unspecified, uncomplicated: Secondary | ICD-10-CM

## 2024-03-05 LAB — BASIC METABOLIC PANEL WITH GFR
Anion gap: 9 (ref 5–15)
BUN: 24 mg/dL — ABNORMAL HIGH (ref 8–23)
CO2: 29 mmol/L (ref 22–32)
Calcium: 8.7 mg/dL — ABNORMAL LOW (ref 8.9–10.3)
Chloride: 103 mmol/L (ref 98–111)
Creatinine, Ser: 1.15 mg/dL (ref 0.61–1.24)
GFR, Estimated: >60 mL/min (ref >60)
Glucose, Bld: 98 mg/dL (ref 70–99)
Potassium: 4.8 mmol/L (ref 3.5–5.1)
Sodium: 141 mmol/L (ref 135–145)

## 2024-03-05 LAB — CBC
HCT: 43.8 % (ref 39.0–52.0)
Hemoglobin: 13.8 g/dL (ref 13.0–17.0)
MCH: 31.6 pg (ref 26.0–34.0)
MCHC: 31.5 g/dL (ref 30.0–36.0)
MCV: 100.2 fL — ABNORMAL HIGH (ref 80.0–100.0)
Platelets: 222 K/uL (ref 150–400)
RBC: 4.37 MIL/uL (ref 4.22–5.81)
RDW: 13.8 % (ref 11.5–15.5)
WBC: 17.6 K/uL — ABNORMAL HIGH (ref 4.0–10.5)
nRBC: 0 % (ref 0.0–0.2)

## 2024-03-05 MED ORDER — HYDROXYZINE HCL 25 MG PO TABS
25.0000 mg | ORAL_TABLET | Freq: Three times a day (TID) | ORAL | 0 refills | Status: AC | PRN
  Filled 2024-03-05: qty 30, 5d supply, fill #0

## 2024-03-05 MED ORDER — HYDROXYZINE HCL 25 MG PO TABS
50.0000 mg | ORAL_TABLET | Freq: Three times a day (TID) | ORAL | Status: DC | PRN
  Administered 2024-03-05: 50 mg via ORAL
  Filled 2024-03-05: qty 2

## 2024-03-05 MED ORDER — PREDNISONE 10 MG PO TABS
ORAL_TABLET | ORAL | 0 refills | Status: AC
  Filled 2024-03-05: qty 46, 19d supply, fill #0

## 2024-03-05 MED ORDER — LIDOCAINE 5 % EX PTCH: 1.0000 | MEDICATED_PATCH | CUTANEOUS | Status: DC

## 2024-03-05 MED ORDER — AZITHROMYCIN 250 MG PO TABS
500.0000 mg | ORAL_TABLET | Freq: Once | ORAL | 0 refills | Status: AC
  Filled 2024-03-05: qty 2, 1d supply, fill #0

## 2024-03-05 MED ORDER — TRELEGY ELLIPTA 200-62.5-25 MCG/ACT IN AEPB
1.0000 | INHALATION_SPRAY | Freq: Every day | RESPIRATORY_TRACT | 0 refills | Status: AC
  Filled 2024-03-05: qty 120, 60d supply, fill #0

## 2024-03-05 NOTE — Plan of Care (Signed)

## 2024-03-05 NOTE — Discharge Summary (Signed)
 Name: Francisco [name] MRN: [id number] DOB: 1960-12-29 63 y.o. PCP: Heddy Barren, DO  Date of Admission: 03/03/2024  9:20 PM Date of Discharge: 03/05/2024 Attending Physician: Dr. Mliss Foot  Discharge Diagnosis: 1. Active Problems:   COPD with acute exacerbation (HCC)   Longstanding persistent atrial fibrillation (HCC)   Tobacco abuse   Tobacco use disorder   Discharge Medications: Allergies as of 03/05/2024       Reactions   Ivp Dye [iodinated Contrast Media] Other (See Comments)   Seizures   Ultram [tramadol] Other (See Comments)   Seizures   Vibramycin  [doxycycline ] Other (See Comments)   Burning sensation to his hands.        Medication List     STOP taking these medications    Trelegy Ellipta  100-62.5-25 MCG/ACT Aepb Generic drug: Fluticasone -Umeclidin-Vilant Replaced by: Trelegy Ellipta  200-62.5-25 MCG/ACT Aepb       TAKE these medications    Airsupra 90-80 MCG/ACT Aero Generic drug: Albuterol -Budesonide  Inhale 2 puffs into the lungs every 4 (four) hours as needed (wheeze, shortness of breath).   albuterol  (2.5 MG/3ML) 0.083% nebulizer solution Commonly known as: PROVENTIL  Take 2.5 mg by nebulization every 6 (six) hours as needed for wheezing or shortness of breath.   albuterol  108 (90 Base) MCG/ACT inhaler Commonly known as: VENTOLIN  HFA Inhale 1-2 puffs into the lungs every 6 (six) hours as needed for wheezing or shortness of breath.   apixaban  5 MG Tabs tablet Commonly known as: Eliquis  Take 1 tablet (5 mg total) by mouth 2 (two) times daily.   atorvastatin  40 MG tablet Commonly known as: LIPITOR  Take 1 tablet (40 mg total) by mouth daily.   azithromycin  250 MG tablet Commonly known as: ZITHROMAX  Take 2 tablets (500 mg total) by mouth once for 1 dose. Tomorrow What changed:  medication strength when to take this additional instructions   benzonatate  200 MG capsule Commonly known as: TESSALON  Take 1 capsule (200 mg total) by mouth  3 (three) times daily as needed for cough.   buPROPion 150 MG 12 hr tablet Commonly known as: Wellbutrin SR Take 1 tablet (150 mg total) by mouth 2 (two) times daily. Take 1 tablet once daily for the first 3 days (on 10/15-10/17), then take one tablet twice daily (10/18 and onward)   diltiazem  120 MG 24 hr capsule Commonly known as: CARDIZEM  CD Take 1 capsule (120 mg total) by mouth daily.   Dupixent  300 MG/2ML Soaj Generic drug: Dupilumab  Inject 300 mg into the skin every 14 (fourteen) days. What changed:  when to take this additional instructions   fluticasone  50 MCG/ACT nasal spray Commonly known as: FLONASE  Place 1 spray into both nostrils daily. What changed:  when to take this reasons to take this   guaiFENesin -dextromethorphan  100-10 MG/5ML syrup Commonly known as: ROBITUSSIN DM Take 15 mLs by mouth 4 (four) times daily.   hydrOXYzine  25 MG tablet Commonly known as: ATARAX  Take 1 tablet (25 mg total) by mouth 3 (three) times daily as needed for anxiety (Can take extra dose PRN not exceeding 50 mg TID). What changed: reasons to take this   ipratropium-albuterol  0.5-2.5 (3) MG/3ML Soln Commonly known as: DUONEB Take 3 mLs by nebulization every 4 (four) hours as needed.   loratadine  10 MG tablet Commonly known as: Claritin  Take 1 tablet (10 mg total) by mouth daily.   losartan  50 MG tablet Commonly known as: COZAAR  Take 1 tablet (50 mg total) by mouth daily.   metoprolol  tartrate 25 MG tablet Commonly  known as: LOPRESSOR  Take 1 tablet (25 mg total) by mouth 2 (two) times daily.   nicotine  21 mg/24hr patch Commonly known as: NICODERM CQ  - dosed in mg/24 hours Place 1 patch (21 mg total) onto the skin daily.   nitroGLYCERIN  0.4 MG SL tablet Commonly known as: NITROSTAT  Place 1 tablet (0.4 mg total) under the tongue every 5 (five) minutes x 3 doses as needed for chest pain.   OXYGEN  Inhale 2-4 L/min into the lungs See admin instructions. Inhale 2 L/min  continuously (baseline). May increase up to 4 L/min if needed for poor oxygen  saturation or exertion.   predniSONE  50 MG tablet Commonly known as: DELTASONE  Take 1 tablet (50 mg total) by mouth daily for 4 days. What changed: Another medication with the same name was added. Make sure you understand how and when to take each.   predniSONE  10 MG tablet Commonly known as: DELTASONE  Take 4 tablets (40 mg total) by mouth daily with breakfast for 4 days, THEN 3 tablets (30 mg total) daily with breakfast for 5 days, THEN 2 tablets (20 mg total) daily with breakfast for 5 days, THEN 1 tablet (10 mg total) daily with breakfast for 5 days. Start taking on: March 06, 2024 What changed: You were already taking a medication with the same name, and this prescription was added. Make sure you understand how and when to take each.   thiamine  100 MG tablet Commonly known as: VITAMIN B1 Take 1 tablet (100 mg total) by mouth daily.   Trelegy Ellipta  200-62.5-25 MCG/ACT Aepb Generic drug: Fluticasone -Umeclidin-Vilant Inhale 1 puff into the lungs daily. Replaces: Trelegy Ellipta  100-62.5-25 MCG/ACT Aepb               Durable Medical Equipment  (From admission, onward)           Start     Ordered   03/05/24 1143  For home use only DME Bipap  Once       Comments: Max IPAP 10  Min 5.  Full face mask Headgear  Cushions  Filters  Climate control tubing  Water chamber  Heated humidity  Diagnosis: COPD and Chronic Respiratory Failure  Question Answer Comment  Length of Need Lifetime   Keep O2 saturation between 88-92%   Inspiratory pressure 10   Expiratory pressure 5      03/05/24 1142            Disposition and follow-up:   Mr.Taino Pieper was discharged from [facility] in Stable condition.  At the hospital follow up visit please address:   1. COPD Exacerbation / Chronic Respiratory Failure - Assess symptoms and response to Trelegy 200 mcg, Airsupra,  DuoNebs, and prednisone  taper. - Confirm nightly and daytime BiPAP use. - Reinforce smoking cessation.  2. Pulmonary HTN / Heart Disease - Review BP, volume status, HR control, and medication adherence (Eliquis , metoprolol , diltiazem , losartan , atorvastatin ).  3. Medication Access - Ensure he obtained azithromycin  and understands his steroid taper. - Confirm home BiPAP delivery and correct settings.  4. Substance Use - Brief counseling on tobacco and intermittent methamphetamine use.  2.  Labs / imaging needed at time of follow-up: Chest Xray, CBC, CMP  3.  Pending labs/ test needing follow-up: N/A   Follow-up Appointments:  Follow-up Information     Marylu Gee, DO Follow up in 1 week(s).   Specialty: Internal Medicine Why: 03/12/2024 at 1:30 PM for hospital follow-up after COPD exacerbation Contact information: [address], [address] [place] [address] [phone]         [facility]  Follow up.   Why: BIPAP for home use- [employer] to deliver to the home. Contact information: [address] [place] [address] [phone]         Hunsucker, [name], MD Follow up.   Specialty: Pulmonary Disease Why: 03/08/2024 for Hospital F/U Contact information: [address] [place] [address] [phone]                 Now gimme the hospital course for discharge summery for problems in hashtag and then the hospital course  Hospital Course by problem list: Onie Hayashi is a 63 y.o. person living with a history of COPD, A-fib on Eliquis  who presented with SOB and admitted for PT exacerbation, now being discharged on hospital day 1 with the following pertinent hospital course:  COPD Exacerbation The patient presented with acute worsening dyspnea, increased work of breathing, and hypoxia. In the ED he required BiPAP with improvement. He was treated with IV steroids, nebulized bronchodilators, magnesium , azithromycin , and  scheduled DuoNebs. CXR showed no acute disease. VBG was consistent with chronic compensated hypercapnia. His symptoms improved with therapy, and he was transitioned to prednisone  and nebulizers. Pulmonology recommended increasing Trelegy to 200 mcg and starting BiPAP nightly and as needed. He returned to baseline oxygen  of 2 L.  Acute on Chronic Hypoxic Respiratory Failure He required BiPAP due to increased respiratory distress and hypercapnia. After stabilization, oxygen  was weaned to his baseline 2 L. Pulmonology confirmed the need for home BiPAP for nighttime use and daytime naps. Care management arranged equipment delivery prior to discharge.  Severe Pulmonary Hypertension History of severe PAH on prior catheterization. No evidence of acute decompensation during admission. No volume overload. He remained hemodynamically stable with preserved EF. Continued guideline-directed therapy and oxygen  support.  Paroxysmal Atrial Fibrillation No episodes of rapid ventricular response during this admission. Continued home Eliquis , metoprolol , and diltiazem . Rate remained controlled throughout the stay.  Dilated Cardiomyopathy with Recovered EF Past EF improved to 60-65 percent. He remained euvolemic with no signs of HF exacerbation. Continued home GDMT including losartan , metoprolol , and statin.  Coronary Artery Disease (s/p PCI) No ischemic symptoms. Serial troponins were normal. EKG remained without acute changes. Continued atorvastatin  and nitroglycerin  as needed.   Hypertension Blood pressure was elevated on arrival, likely due to respiratory distress. Improved with respiratory stabilization. Continued home regimen of losartan , metoprolol , and diltiazem .  Tobacco Use Disorder He was counseled on smoking cessation. Continued nicotine  patch and Wellbutrin. Reinforced the importance of quitting to reduce COPD exacerbations.  Medication Access and Adherence He reported difficulty picking up  azithromycin  prior to this admission. All medications were clarified, and he was counseled on completing antibiotic and steroid taper. Confirmed he will receive BiPAP equipment and medications prior to discharge.  Substance Use (Intermittent IV Methamphetamine) He disclosed weekly use. Discussed risks related to COPD and heart disease. Offered resources for cessation.   Stable chronic medical conditions: A-fib on Eliquis  Essential hypertension Hepatitis C  Subjective Visited the patient today during an episode of shortness of breath. His oxygen  saturation was 84 percent on room air and improved promptly after oxygen  was applied via nasal cannula. He reported that these episodes often occur when he becomes anxious. After receiving oxygen , his symptoms resolved, and he sat comfortably on the bed without distress, maintaining saturations above 90 percent.  He denied shortness of breath at rest but continues to have  dyspnea with ambulation or showering. Discharge instructions were reviewed, including inhaler use, BiPAP, and smoking cessation. The patient is eager to go home today and expressed strong motivation to quit smoking.  Discharge Exam:   BP (!) 173/89 (BP Location: Right Arm)   Pulse 67   Temp 98.7 F (37.1 C) (Oral)   Resp 20   Ht 6' (1.829 m)   Wt 95.1 kg   SpO2 95%   BMI 28.43 kg/m  Discharge exam:  General: Alert, no acute distress.  HEENT: Soft, supple, with full range of motion. No cervical lymphadenopathy or thyromegaly appreciated. Cardiac: Regular rate and rhythm, normal S1 and S2, no murmurs, rubs, or gallops. Lungs: Clear to auscultation bilaterally in all fields, with no wheezes or crackles appreciated. Abdomen: Non-distended, soft, non-tender, with no masses or organomegaly appreciated. Normal active bowel sounds. Extremities: Clubbing in upper extremity fingers. 1+ pitting edema in bilateral lower extremity.  Dorsalis pedis pulses detected full  neurological: Alert  and oriented 4. Cranial nerves II-XII grossly intact. No focal deficits.  Pertinent Labs, Studies, and Procedures:     Latest Ref Rng & Units 03/05/2024    6:36 AM 03/04/2024    1:46 AM 03/03/2024    9:38 PM  CBC  WBC 4.0 - 10.5 K/uL 17.6  13.0    Hemoglobin 13.0 - 17.0 g/dL 86.1  85.7  85.6   Hematocrit 39.0 - 52.0 % 43.8  44.9  42.0   Platelets 150 - 400 K/uL 222  151         Latest Ref Rng & Units 03/05/2024    6:36 AM 03/04/2024    1:46 AM 03/03/2024    9:38 PM  CMP  Glucose 70 - 99 mg/dL 98  865    BUN 8 - 23 mg/dL 24  19    Creatinine 9.38 - 1.24 mg/dL 8.84  8.86    Sodium 864 - 145 mmol/L 141  139  140   Potassium 3.5 - 5.1 mmol/L 4.8  5.0  4.8   Chloride 98 - 111 mmol/L 103  102    CO2 22 - 32 mmol/L 29  27    Calcium  8.9 - 10.3 mg/dL 8.7  8.4      DG Chest Port 1 View Result Date: 03/03/2024 CLINICAL DATA:  Respiratory distress with dry cough. EXAM: PORTABLE CHEST 1 VIEW COMPARISON:  March 01, 2024 FINDINGS: The heart size and mediastinal contours are within normal limits. Both lungs are clear. The visualized skeletal structures are unremarkable. IMPRESSION: No active disease. Electronically Signed   By: Suzen Dials M.D.   On: 03/03/2024 21:47     Discharge Instructions: Discharge Instructions     Call MD for:  difficulty breathing, headache or visual disturbances   Complete by: As directed    Call MD for:  extreme fatigue   Complete by: As directed    Call MD for:  temperature >100.4   Complete by: As directed    Diet - low sodium heart healthy   Complete by: As directed    Discharge instructions   Complete by: As directed    You were admitted for: Worsening shortness of breath due to a COPD exacerbation and acute on chronic respiratory failure. You improved with BiPAP, steroids, nebulizer treatments and oxygen  support.  Medications  Continue your home inhalers including Trelegy. Your pulmonologist increased Trelegy to the 200 mcg dose.   Take prednisone  as instructed on your taper.  Take prednisone  40 mg for 4 days,  then continue with prednisone  30 mg for 5 days, then continue with prednisone  20 mg for 5 days, and then continue with prednisone  10 mg for 5 days and stop taking prednisone  after that.  Take azithromycin  for 1 dose tomorrow  Use your DuoNeb treatments as needed and as scheduled.  Continue Eliquis , diltiazem , metoprolol , losartan , atorvastatin , Wellbutrin, Claritin , Flonase , and all other home medications.  Use BiPAP every night and during the day for naps as instructed.  Oxygen   Continue your home oxygen  at your usual 2 liters. You may increase up to  liters only if needed for low oxygen  levels.  Use your BiPAP machine nightly and as needed when you feel short of breath.  Activity  Pace your activities. Stop and rest if you become short of breath.  Continue physical therapy exercises given to you in the hospital.  Smoking  Please stop smoking. It is very important for your breathing and lung health.  Continue your nicotine  patch and Wellbutrin.  Warning signs, seek care right away  Severe shortness of breath not improving with inhalers or BiPAP  Chest pain  Fever, worsening cough, or new sputum  Oxygen  saturation staying below your usual level  Feeling confused or very sleepy  Follow-up  Primary care doctor: within 1 week, Dr. Norman Lobstein 03/12/2024 at 1:30 PM  Pulmonology appointment: November 19 with Dr. Annella  Bring your discharge papers and medication list to all appointments.  Equipment  Your BiPAP machine should be delivered to your home. Use it every night.  Use your nebulizer at home as instructed.  Diet  Regular diet. Please stay well-hydrated.  If you have questions or concerns, contact your primary care doctor.   Increase activity slowly   Complete by: As directed        Signed: Bernadine Manos, MD 03/05/2024, 1:52 PM

## 2024-03-05 NOTE — Progress Notes (Signed)
 Messaged by RN that patient was short of breath. At bedside, patient reported that he had been feeling short of breath and chest tightness but was currently working through breathing exercises. RT was at bedside; patient had just finished a nebulized treatment.   No cough or sputum production. No chest pain. Has not had increased supplemental O2 need when ambulating to commode. He was feeling anxious and took some Atarax  and feels better now. His main concern is that he believes he should be receiving IV magnesium  and solumedrol as this is what he received with EMS whenever he has an attack. He's tells me he wants to go home if there are no IV therapies available to him.   We discussed that we would like to obtain information about his respiratory status while ambulating, attempt to initiate the process of a home BiPAP machine from inpatient, and overall improvement in his dyspnea before he discharged. He was amenable to this.  Nurse worried that patient was smoking cigarettes in the bathroom. Patient denies.  Telemetry: VS HR 80s, BP 172/65, SPO2 98% on 2L [place]. Alert older man, laying flat in bed, speaking in full sentences in NAD RRR and clear to auscultation bilaterally without wheezing or rales. Intermittently, patient with referred sonorous posterior oropharyngeal sounds.  No lower extremity edema Warm and dry skin   Reviewed recent BMP and recent hospital consults and progress notes for this person admitted for acute on chronic hypoxemic respiratory failure being treated for COPD exacerbation with intermitted adherence to outpatient therapy and current tobacco use.   - Continues to decline therapies. Currently agrees to try BiPAP - If anxiety continues despite atarax  dosing, will try low dose ativan  - Will ask day team to round on patient first to re-assess - Will need another nicotine  patch; smokes ~ 15 cigarettes per day - No current need for escalation of care

## 2024-03-05 NOTE — Telephone Encounter (Signed)
 Copied from CRM [id number]. Topic: General - Other >> Mar 05, 2024  9:14 AM Benton KIDD wrote: Reason for CRM: patient is calling because he is in the hospital . And wanted to know was someone from the pulmonary office coming to see patient at hospital; . Please give patient a call [phone]   Spoke with PT he's leaving hospital today.   -NFN

## 2024-03-05 NOTE — Progress Notes (Signed)
 Pharmacist and student pharmacists rounding with the IMTS/B[facility] Service:  The following have been provided to the patient:  Explanation of Trelegy Ellipta  200-62.5-25 mcg inhaler that included written handout materials, and an accompanying video demonstrating proper use and technique for the inhaler.  Patient was apprised that his co-pay for this inhaler would be $4 through [facility] Medicaid. Patient was provided an opportunity to have asked and answered, any questions regarding this product as well as other prescription    medications.   Compliance was reinforced as a means of preventing subsequent hospitalizations. Patient was provided one Trelegy Ellipta  200-62.5-25 mcg [facility]:  Lot number [id number] June 2026.   Lynwood Lites, PharmD, CPP Clinical Pharmacist Practitioner

## 2024-03-05 NOTE — Transitions of Care (Post Inpatient/ED Visit) (Signed)
   03/05/2024  Name: Francisco [name] MRN: [id number] DOB: 16-Mar-1961  Today's TOC FU Call Status: Today's TOC FU Call Status:: Unsuccessful Call (1st Attempt) Unsuccessful Call (1st Attempt) Date: 03/05/24  Attempted to reach the patient regarding the most recent Inpatient/ED visit.  Follow Up Plan: Additional outreach attempts will be made to reach the patient to complete the Transitions of Care (Post Inpatient/ED visit) call.   Signature Julian Lemmings, LPN [facility] Nurse Health Advisor Direct Dial [phone]

## 2024-03-05 NOTE — Discharge Instructions (Signed)
 You were admitted for: Worsening shortness of breath due to a COPD exacerbation and acute on chronic respiratory failure. You improved with BiPAP, steroids, nebulizer treatments and oxygen  support.  Medications  Continue your home inhalers including Trelegy. Your pulmonologist increased Trelegy to the 200 mcg dose.  Take prednisone  as instructed on your taper.  Take prednisone  40 mg for 4 days, then continue with prednisone  30 mg for 5 days, then continue with prednisone  20 mg for 5 days, and then continue with prednisone  10 mg for 5 days and stop taking prednisone  after that.  Take azithromycin  for 1 dose tomorrow  Use your DuoNeb treatments as needed and as scheduled.  Continue Eliquis , diltiazem , metoprolol , losartan , atorvastatin , Wellbutrin, Claritin , Flonase , and all other home medications.  Use BiPAP every night and during the day for naps as instructed.  Oxygen   Continue your home oxygen  at your usual 2 liters. You may increase up to  liters only if needed for low oxygen  levels.  Use your BiPAP machine nightly and as needed when you feel short of breath.  Activity  Pace your activities. Stop and rest if you become short of breath.  Continue physical therapy exercises given to you in the hospital.  Smoking  Please stop smoking. It is very important for your breathing and lung health.  Continue your nicotine  patch and Wellbutrin.  Warning signs, seek care right away  Severe shortness of breath not improving with inhalers or BiPAP  Chest pain  Fever, worsening cough, or new sputum  Oxygen  saturation staying below your usual level  Feeling confused or very sleepy  Follow-up  Primary care doctor: within 1 week, Dr. Norman Lobstein 03/12/2024 at 1:30 PM  Pulmonology appointment: November 19 with Dr. Annella  Bring your discharge papers and medication list to all appointments.  Equipment  Your BiPAP machine should be delivered to your home. Use it every night.  Use your nebulizer  at home as instructed.  Diet  Regular diet. Please stay well-hydrated.  If you have questions or concerns, contact your primary care doctor.

## 2024-03-05 NOTE — TOC Initial Note (Addendum)
 Transition of Care [facility]) - Initial/Assessment Note    Patient Details  Name: Francisco [name] MRN: [id number] Date of Birth: 02/02/1961  Transition of Care [facility]) CM/SW Contact:    Sudie Erminio Deems, RN Phone Number: 03/05/2024, 10:46 AM  Clinical Narrative:  Patient presented for shortness of breath-hx COPD. PTA patient was from home and he states he uses Oxygen  via [employer] at 2 liters. ICM received consult for home BIPAP and patient wants to use [employer]. Referral submitted to [employer] and they will follow for insurance for approval. Patient states he will have transportation home once he is discharged. Patient has PCP and transportation to appointments. No further needs identified at this time.            1055 03-05-24 ICM spoke with patient and he states he wants to go home today.  [employer] Liaison states they have the ABG results to submit to insurance. Patient has [employer]- and the insurance has up to 3-14 days to authorize DME. [employer] Liaison states RT will assess the patient in the home once the DME has been approved. Information relayed to the physicians.   1148 Provider plans to discharge patient home. Patient in need of portable tank delivered to the hospital. [employer] is aware and will deliver to the room before discharge home. No further needs identified.   Expected Discharge Plan: Home/Self Care Barriers to Discharge: No Barriers Identified   Patient Goals and CMS Choice Patient states their goals for this hospitalization and ongoing recovery are:: plan to return home once stable  Expected Discharge Plan and Services In-house Referral: NA Discharge Planning Services: CM Consult Post Acute Care Choice: NA Living arrangements for the past 2 months: Single Family Home                 DME Arranged: Bipap DME Agency: [employer] Date DME Agency Contacted: 03/05/24 Time DME Agency Contacted: 1046 Representative spoke with at DME Agency: [facility] Arranged: NA   Prior Living  Arrangements/Services Living arrangements for the past 2 months: Single Family Home Lives with:: Self Patient language and need for interpreter reviewed:: Yes Do you feel safe going back to the place where you live?: Yes      Need for Family Participation in Patient Care: No (Comment) Care giver support system in place?: No (comment) Current home services: DME (Oxygen  via [employer]) Criminal Activity/Legal Involvement Pertinent to Current Situation/Hospitalization: No - Comment as needed  Activities of Daily Living   ADL Screening (condition at time of admission) Independently performs ADLs?: Yes (appropriate for developmental age) Is the patient deaf or have difficulty hearing?: No Does the patient have difficulty seeing, even when wearing glasses/contacts?: No Does the patient have difficulty concentrating, remembering, or making decisions?: No  Permission Sought/Granted Permission sought to share information with : Family Supports, Magazine Features Editor, Case Estate Manager/land Agent granted to share information with : Yes, Verbal Permission Granted     Permission granted to share info w AGENCY: [employer]        Emotional Assessment Appearance:: Appears stated age Attitude/Demeanor/Rapport: Engaged Affect (typically observed): Appropriate Orientation: : Oriented to Self, Oriented to  Time, Oriented to Situation, Oriented to Place Alcohol / Substance Use: Not Applicable Psych Involvement: No (comment)  Admission diagnosis:  Respiratory distress [R06.03] COPD exacerbation (HCC) [J44.1] Acute respiratory failure with hypoxia (HCC) [J96.01] COPD with acute exacerbation (HCC) [J44.1] Patient Active Problem List   Diagnosis Date Noted   Longstanding persistent atrial fibrillation (HCC) 03/04/2024   Tobacco abuse 03/04/2024  COPD, frequent exacerbations (HCC) 03/02/2024   Chest pain 02/28/2024   Acute respiratory failure with hypoxia (HCC) 02/28/2024   COPD exacerbation (HCC)  02/28/2024   Acute on chronic hypoxic respiratory failure (HCC) 10/30/2023   Paroxysmal A-fib (HCC) 08/13/2023   COPD with acute exacerbation (HCC) 08/13/2023   Seasonal allergies 07/29/2023   Hyperlipidemia 04/29/2023   Benzodiazepine causing adverse effect in therapeutic use 02/14/2023   Pulmonary hypertension (HCC) 02/05/2023   Fatigue 09/28/2022   Right knee pain 08/26/2022   Weight loss 02/19/2022   Combined systolic and diastolic congestive heart failure (HCC) 08/13/2021   Lung nodules 06/24/2021   Hepatitis C 06/24/2021   Back pain 06/24/2021   Anxiety state 05/01/2021   Essential hypertension 04/26/2021   Continuous dependence on cigarette smoking 01/16/2021   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 [place] [address] Phone: [phone] Fax: [phone]  [facility] - [facility] [address] [place] [address] Phone: [phone] Fax: [phone]     Social Drivers of Health (SDOH) Social History: SDOH Screenings   Food Insecurity: No Food Insecurity (03/04/2024)  Housing: Low Risk  (03/04/2024)  Transportation Needs: No Transportation Needs (03/04/2024)  Utilities: Not At Risk (03/04/2024)  Alcohol Screen: Low Risk  (02/07/2023)  Depression (PHQ2-9): Low Risk  (02/08/2024)  Recent Concern: Depression (PHQ2-9) - High Risk (11/21/2023)  Financial Resource Strain: High Risk (08/24/2023)   Received from [facility]  Social  Connections: Unknown (08/14/2023)  Tobacco Use: High Risk (03/03/2024)  Health Literacy: Adequate Health Literacy (11/21/2023)   SDOH Interventions:     Readmission Risk Interventions     No data to display

## 2024-03-05 NOTE — Telephone Encounter (Signed)
 Pharmacy Patient Advocate Encounter  Insurance verification completed.    The patient is insured through [facility]  [place].     Ran test claim for Trelegy Ellipta  [id number] and the current 30 day co-pay is $4.   This test claim was processed through [facility]- copay amounts may vary at other pharmacies due to [employer], or as the patient moves through the different stages of their insurance plan.

## 2024-03-05 NOTE — Progress Notes (Signed)
 While entering patient's room to get his vitals he was coming out of the bathroom and I smelled a strong cigarette smoke odor.  I asked the patient about this and he denied smoking in the room.  I noticed the smell got stronger as I got closer to the bathroom and when I opened the door the lingering odor of cigarette smoke was very evident.  When asked the patient again denied smoking in the room.  I educated patient that smoking is not allowed in the hospital and that it is extremely dangerous due to flammable gases including the oxygen  he is using via nasal cannula.  Charge RN made aware and asked to come speak with the patient.  She came in to assess the situation and he again denied smoking in the room he was then asked to allow her to take his cigarettes to the nursing station and it would be returned at time of discharge.  He was agreeable.   Patient encouraged to adhere to hospital policies.

## 2024-03-06 ENCOUNTER — Other Ambulatory Visit: Payer: Self-pay | Admitting: Student

## 2024-03-06 ENCOUNTER — Other Ambulatory Visit: Payer: Self-pay

## 2024-03-06 ENCOUNTER — Telehealth: Payer: Self-pay | Admitting: *Deleted

## 2024-03-06 ENCOUNTER — Other Ambulatory Visit (HOSPITAL_COMMUNITY): Payer: Self-pay

## 2024-03-06 ENCOUNTER — Telehealth: Payer: Self-pay

## 2024-03-06 MED ORDER — ALBUTEROL SULFATE HFA 108 (90 BASE) MCG/ACT IN AERS
1.0000 | INHALATION_SPRAY | Freq: Four times a day (QID) | RESPIRATORY_TRACT | 0 refills | Status: AC | PRN
  Filled 2024-03-06: qty 6.7, 30d supply, fill #0

## 2024-03-06 NOTE — Transitions of Care (Post Inpatient/ED Visit) (Signed)
   03/06/2024  Name: Francisco [name] MRN: [id number] DOB: 02-15-1961  Today's TOC FU Call Status: Today's TOC FU Call Status:: Unsuccessful Call (1st Attempt) Unsuccessful Call (1st Attempt) Date: 03/06/24  Attempted to reach the patient regarding the most recent Inpatient/ED visit.  Follow Up Plan: Additional outreach attempts will be made to reach the patient to complete the Transitions of Care (Post Inpatient/ED visit) call.   Medford Balboa, BSN, RN [place]  [facility] - [employer] [facility] RN Care Manager [phone]

## 2024-03-06 NOTE — Telephone Encounter (Signed)
 Medication sent to pharmacy

## 2024-03-06 NOTE — Telephone Encounter (Signed)
 RTC to patient informed him that he will need to keep the appointment with Pulmonary on tomorrow so that he will be able to get the settings for the CPAP machine so that he can have it set up at his home.  Patient knew about the appointment and plans to keep tomorrow to get his CPAP.    Copied from CRM [id number]. Topic: Clinical - Order For Equipment >> Mar 06, 2024  2:24 PM Chiquita SQUIBB wrote: Reason for CRM: Patient is calling in stating he was released from the hospital yesterday and needs a c pap machine. The patient believed the hospital was ordering it but they id not. Patient states he needs this as soon as possible. Patient also stated that he does not have a way to get anywhere and would need it delivered and set up. Please contact the patient back at [phone].

## 2024-03-06 NOTE — Progress Notes (Signed)
 Specialty Pharmacy Refill Coordination Note  Francisco [name] is a 63 y.o. male contacted today regarding refills of specialty medication(s) Dupilumab  (Dupixent )   Patient requested Delivery   Delivery date: 03/08/24   Verified address: [address] [place] [address]   Medication will be filled on: 03/07/24

## 2024-03-07 ENCOUNTER — Ambulatory Visit: Admitting: Pulmonary Disease

## 2024-03-07 ENCOUNTER — Telehealth (HOSPITAL_COMMUNITY): Payer: Self-pay

## 2024-03-07 ENCOUNTER — Emergency Department (HOSPITAL_COMMUNITY)

## 2024-03-07 ENCOUNTER — Encounter (HOSPITAL_COMMUNITY): Payer: Self-pay | Admitting: Emergency Medicine

## 2024-03-07 ENCOUNTER — Telehealth: Payer: Self-pay

## 2024-03-07 ENCOUNTER — Other Ambulatory Visit (HOSPITAL_COMMUNITY): Payer: Self-pay

## 2024-03-07 ENCOUNTER — Other Ambulatory Visit: Payer: Self-pay

## 2024-03-07 ENCOUNTER — Inpatient Hospital Stay (HOSPITAL_COMMUNITY)
Admission: EM | Admit: 2024-03-07 | Discharge: 2024-03-14 | DRG: 193 | Disposition: A | Discharge status: Hospice | Attending: Internal Medicine | Admitting: Internal Medicine

## 2024-03-07 ENCOUNTER — Encounter: Payer: Self-pay | Admitting: Pulmonary Disease

## 2024-03-07 DIAGNOSIS — I10 Essential (primary) hypertension: Secondary | ICD-10-CM

## 2024-03-07 DIAGNOSIS — R000 Tachycardia, unspecified: Secondary | ICD-10-CM | POA: Diagnosis present

## 2024-03-07 DIAGNOSIS — J441 Chronic obstructive pulmonary disease with (acute) exacerbation: Secondary | ICD-10-CM | POA: Diagnosis not present

## 2024-03-07 DIAGNOSIS — Z881 Allergy status to other antibiotic agents status: Secondary | ICD-10-CM

## 2024-03-07 DIAGNOSIS — Z955 Presence of coronary angioplasty implant and graft: Secondary | ICD-10-CM

## 2024-03-07 DIAGNOSIS — Z888 Allergy status to other drugs, medicaments and biological substances status: Secondary | ICD-10-CM

## 2024-03-07 DIAGNOSIS — Z8261 Family history of arthritis: Secondary | ICD-10-CM

## 2024-03-07 DIAGNOSIS — I2510 Atherosclerotic heart disease of native coronary artery without angina pectoris: Secondary | ICD-10-CM | POA: Diagnosis present

## 2024-03-07 DIAGNOSIS — F1510 Other stimulant abuse, uncomplicated: Secondary | ICD-10-CM | POA: Diagnosis present

## 2024-03-07 DIAGNOSIS — Z7901 Long term (current) use of anticoagulants: Secondary | ICD-10-CM

## 2024-03-07 DIAGNOSIS — J9621 Acute and chronic respiratory failure with hypoxia: Secondary | ICD-10-CM | POA: Diagnosis not present

## 2024-03-07 DIAGNOSIS — I110 Hypertensive heart disease with heart failure: Secondary | ICD-10-CM | POA: Diagnosis not present

## 2024-03-07 DIAGNOSIS — J9622 Acute and chronic respiratory failure with hypercapnia: Secondary | ICD-10-CM | POA: Diagnosis not present

## 2024-03-07 DIAGNOSIS — Z91041 Radiographic dye allergy status: Secondary | ICD-10-CM

## 2024-03-07 DIAGNOSIS — I5033 Acute on chronic diastolic (congestive) heart failure: Secondary | ICD-10-CM | POA: Diagnosis not present

## 2024-03-07 DIAGNOSIS — Z515 Encounter for palliative care: Secondary | ICD-10-CM | POA: Diagnosis not present

## 2024-03-07 DIAGNOSIS — N179 Acute kidney failure, unspecified: Secondary | ICD-10-CM | POA: Diagnosis present

## 2024-03-07 DIAGNOSIS — Z66 Do not resuscitate: Secondary | ICD-10-CM | POA: Diagnosis not present

## 2024-03-07 DIAGNOSIS — Z79899 Other long term (current) drug therapy: Secondary | ICD-10-CM | POA: Diagnosis not present

## 2024-03-07 DIAGNOSIS — Z7951 Long term (current) use of inhaled steroids: Secondary | ICD-10-CM

## 2024-03-07 DIAGNOSIS — Z823 Family history of stroke: Secondary | ICD-10-CM

## 2024-03-07 DIAGNOSIS — G4733 Obstructive sleep apnea (adult) (pediatric): Secondary | ICD-10-CM | POA: Diagnosis present

## 2024-03-07 DIAGNOSIS — J1001 Influenza due to other identified influenza virus with the same other identified influenza virus pneumonia: Secondary | ICD-10-CM | POA: Diagnosis not present

## 2024-03-07 DIAGNOSIS — J1008 Influenza due to other identified influenza virus with other specified pneumonia: Secondary | ICD-10-CM | POA: Diagnosis not present

## 2024-03-07 DIAGNOSIS — I70222 Atherosclerosis of native arteries of extremities with rest pain, left leg: Secondary | ICD-10-CM

## 2024-03-07 DIAGNOSIS — Z8673 Personal history of transient ischemic attack (TIA), and cerebral infarction without residual deficits: Secondary | ICD-10-CM

## 2024-03-07 DIAGNOSIS — I959 Hypotension, unspecified: Secondary | ICD-10-CM | POA: Diagnosis present

## 2024-03-07 DIAGNOSIS — J101 Influenza due to other identified influenza virus with other respiratory manifestations: Secondary | ICD-10-CM

## 2024-03-07 DIAGNOSIS — I480 Paroxysmal atrial fibrillation: Secondary | ICD-10-CM | POA: Diagnosis present

## 2024-03-07 DIAGNOSIS — Z7189 Other specified counseling: Secondary | ICD-10-CM

## 2024-03-07 DIAGNOSIS — R230 Cyanosis: Secondary | ICD-10-CM | POA: Diagnosis present

## 2024-03-07 DIAGNOSIS — I420 Dilated cardiomyopathy: Secondary | ICD-10-CM | POA: Diagnosis present

## 2024-03-07 DIAGNOSIS — F17210 Nicotine dependence, cigarettes, uncomplicated: Secondary | ICD-10-CM | POA: Diagnosis present

## 2024-03-07 DIAGNOSIS — J440 Chronic obstructive pulmonary disease with acute lower respiratory infection: Secondary | ICD-10-CM | POA: Diagnosis not present

## 2024-03-07 DIAGNOSIS — Z8249 Family history of ischemic heart disease and other diseases of the circulatory system: Secondary | ICD-10-CM

## 2024-03-07 DIAGNOSIS — Z0389 Encounter for observation for other suspected diseases and conditions ruled out: Secondary | ICD-10-CM | POA: Diagnosis not present

## 2024-03-07 DIAGNOSIS — Z91148 Patient's other noncompliance with medication regimen for other reason: Secondary | ICD-10-CM

## 2024-03-07 DIAGNOSIS — R0689 Other abnormalities of breathing: Secondary | ICD-10-CM | POA: Diagnosis not present

## 2024-03-07 DIAGNOSIS — F419 Anxiety disorder, unspecified: Secondary | ICD-10-CM | POA: Diagnosis present

## 2024-03-07 DIAGNOSIS — R0602 Shortness of breath: Secondary | ICD-10-CM | POA: Diagnosis not present

## 2024-03-07 DIAGNOSIS — Z59868 Other specified financial insecurity: Secondary | ICD-10-CM

## 2024-03-07 DIAGNOSIS — Z9981 Dependence on supplemental oxygen: Secondary | ICD-10-CM | POA: Diagnosis not present

## 2024-03-07 DIAGNOSIS — I272 Pulmonary hypertension, unspecified: Secondary | ICD-10-CM | POA: Diagnosis present

## 2024-03-07 DIAGNOSIS — I5020 Unspecified systolic (congestive) heart failure: Secondary | ICD-10-CM

## 2024-03-07 DIAGNOSIS — I482 Chronic atrial fibrillation, unspecified: Secondary | ICD-10-CM

## 2024-03-07 DIAGNOSIS — I252 Old myocardial infarction: Secondary | ICD-10-CM

## 2024-03-07 DIAGNOSIS — Z9911 Dependence on respirator [ventilator] status: Secondary | ICD-10-CM

## 2024-03-07 DIAGNOSIS — E785 Hyperlipidemia, unspecified: Secondary | ICD-10-CM | POA: Diagnosis present

## 2024-03-07 DIAGNOSIS — Z1152 Encounter for screening for COVID-19: Secondary | ICD-10-CM | POA: Diagnosis not present

## 2024-03-07 DIAGNOSIS — J984 Other disorders of lung: Secondary | ICD-10-CM | POA: Diagnosis present

## 2024-03-07 DIAGNOSIS — Z716 Tobacco abuse counseling: Secondary | ICD-10-CM

## 2024-03-07 DIAGNOSIS — R231 Pallor: Secondary | ICD-10-CM | POA: Diagnosis present

## 2024-03-07 LAB — URINALYSIS, W/ REFLEX TO CULTURE (INFECTION SUSPECTED)
Bilirubin Urine: NEGATIVE
Glucose, UA: 150 mg/dL — AB
Hgb urine dipstick: NEGATIVE
Ketones, ur: NEGATIVE mg/dL
Leukocytes,Ua: NEGATIVE
Nitrite: NEGATIVE
Protein, ur: 100 mg/dL — AB
Specific Gravity, Urine: 1.017 (ref 1.005–1.030)
pH: 5 (ref 5.0–8.0)

## 2024-03-07 LAB — I-STAT CHEM 8, ED
BUN: 26 mg/dL — ABNORMAL HIGH (ref 8–23)
Calcium, Ion: 1.23 mmol/L (ref 1.15–1.40)
Chloride: 94 mmol/L — ABNORMAL LOW (ref 98–111)
Creatinine, Ser: 1.4 mg/dL — ABNORMAL HIGH (ref 0.61–1.24)
Glucose, Bld: 252 mg/dL — ABNORMAL HIGH (ref 70–99)
HCT: 51 % (ref 39.0–52.0)
Hemoglobin: 17.3 g/dL — ABNORMAL HIGH (ref 13.0–17.0)
Potassium: 5.2 mmol/L — ABNORMAL HIGH (ref 3.5–5.1)
Sodium: 140 mmol/L (ref 135–145)
TCO2: 39 mmol/L — ABNORMAL HIGH (ref 22–32)

## 2024-03-07 LAB — I-STAT VENOUS BLOOD GAS, ED
Acid-Base Excess: 7 mmol/L — ABNORMAL HIGH (ref 0.0–2.0)
Acid-Base Excess: 10 mmol/L — ABNORMAL HIGH (ref 0.0–2.0)
Bicarbonate: 40.9 mmol/L — ABNORMAL HIGH (ref 20.0–28.0)
Bicarbonate: 40.3 mmol/L — ABNORMAL HIGH (ref 20.0–28.0)
Calcium, Ion: 1.21 mmol/L (ref 1.15–1.40)
Calcium, Ion: 1.11 mmol/L — ABNORMAL LOW (ref 1.15–1.40)
HCT: 52 % (ref 39.0–52.0)
HCT: 45 % (ref 39.0–52.0)
Hemoglobin: 17.7 g/dL — ABNORMAL HIGH (ref 13.0–17.0)
Hemoglobin: 15.3 g/dL (ref 13.0–17.0)
O2 Saturation: 82 %
O2 Saturation: 63 %
Potassium: 5.1 mmol/L (ref 3.5–5.1)
Potassium: 5.4 mmol/L — ABNORMAL HIGH (ref 3.5–5.1)
Sodium: 139 mmol/L (ref 135–145)
Sodium: 138 mmol/L (ref 135–145)
TCO2: 44 mmol/L — ABNORMAL HIGH (ref 22–32)
TCO2: 43 mmol/L — ABNORMAL HIGH (ref 22–32)
pCO2, Ven: 105.1 mmHg (ref 44–60)
pCO2, Ven: 78.4 mmHg (ref 44–60)
pH, Ven: 7.198 — CL (ref 7.25–7.43)
pH, Ven: 7.319 (ref 7.25–7.43)
pO2, Ven: 61 mmHg — ABNORMAL HIGH (ref 32–45)
pO2, Ven: 37 mmHg (ref 32–45)

## 2024-03-07 LAB — CBC WITH DIFFERENTIAL/PLATELET
Abs Immature Granulocytes: 0.9 K/uL — ABNORMAL HIGH (ref 0.00–0.07)
Basophils Absolute: 0 K/uL (ref 0.0–0.1)
Basophils Relative: 0 %
Eosinophils Absolute: 0 K/uL (ref 0.0–0.5)
Eosinophils Relative: 0 %
HCT: 50.2 % (ref 39.0–52.0)
Hemoglobin: 16.1 g/dL (ref 13.0–17.0)
Immature Granulocytes: 4 %
Lymphocytes Relative: 9 %
Lymphs Abs: 2 K/uL (ref 0.7–4.0)
MCH: 31.9 pg (ref 26.0–34.0)
MCHC: 32.1 g/dL (ref 30.0–36.0)
MCV: 99.4 fL (ref 80.0–100.0)
Monocytes Absolute: 0.9 K/uL (ref 0.1–1.0)
Monocytes Relative: 4 %
Neutro Abs: 18.9 K/uL — ABNORMAL HIGH (ref 1.7–7.7)
Neutrophils Relative %: 83 %
Platelets: 292 K/uL (ref 150–400)
RBC: 5.05 MIL/uL (ref 4.22–5.81)
RDW: 13.7 % (ref 11.5–15.5)
WBC: 22.7 K/uL — ABNORMAL HIGH (ref 4.0–10.5)
nRBC: 0.1 % (ref 0.0–0.2)

## 2024-03-07 LAB — COMPREHENSIVE METABOLIC PANEL WITH GFR
ALT: 53 U/L — ABNORMAL HIGH (ref 0–44)
AST: 28 U/L (ref 15–41)
Albumin: 4 g/dL (ref 3.5–5.0)
Alkaline Phosphatase: 90 U/L (ref 38–126)
Anion gap: 15 (ref 5–15)
BUN: 18 mg/dL (ref 8–23)
CO2: 33 mmol/L — ABNORMAL HIGH (ref 22–32)
Calcium: 9.5 mg/dL (ref 8.9–10.3)
Chloride: 94 mmol/L — ABNORMAL LOW (ref 98–111)
Creatinine, Ser: 1.5 mg/dL — ABNORMAL HIGH (ref 0.61–1.24)
GFR, Estimated: 52 mL/min — ABNORMAL LOW (ref >60)
Glucose, Bld: 258 mg/dL — ABNORMAL HIGH (ref 70–99)
Potassium: 5.3 mmol/L — ABNORMAL HIGH (ref 3.5–5.1)
Sodium: 142 mmol/L (ref 135–145)
Total Bilirubin: 0.9 mg/dL (ref 0.0–1.2)
Total Protein: 6.8 g/dL (ref 6.5–8.1)

## 2024-03-07 LAB — PROTIME-INR
INR: 1.2 (ref 0.8–1.2)
Prothrombin Time: 15.5 s — ABNORMAL HIGH (ref 11.4–15.2)

## 2024-03-07 LAB — RESP PANEL BY RT-PCR (RSV, FLU A&B, COVID)  RVPGX2
Influenza A by PCR: POSITIVE — AB
Influenza B by PCR: NEGATIVE
Resp Syncytial Virus by PCR: NEGATIVE
SARS Coronavirus 2 by RT PCR: NEGATIVE

## 2024-03-07 LAB — TROPONIN I (HIGH SENSITIVITY)
Troponin I (High Sensitivity): 28 ng/L — ABNORMAL HIGH (ref <18)
Troponin I (High Sensitivity): 24 ng/L — ABNORMAL HIGH (ref <18)

## 2024-03-07 LAB — BRAIN NATRIURETIC PEPTIDE: B Natriuretic Peptide: 194.7 pg/mL — ABNORMAL HIGH (ref 0.0–100.0)

## 2024-03-07 LAB — I-STAT CG4 LACTIC ACID, ED
Lactic Acid, Venous: 1.8 mmol/L (ref 0.5–1.9)
Lactic Acid, Venous: 1.6 mmol/L (ref 0.5–1.9)

## 2024-03-07 MED ORDER — IPRATROPIUM-ALBUTEROL 0.5-2.5 (3) MG/3ML IN SOLN
3.0000 mL | Freq: Three times a day (TID) | RESPIRATORY_TRACT | Status: DC
  Administered 2024-03-07 – 2024-03-08 (×2): 3 mL via RESPIRATORY_TRACT
  Filled 2024-03-07 (×2): qty 3

## 2024-03-07 MED ORDER — NITROGLYCERIN 2 % TD OINT
1.0000 [in_us] | TOPICAL_OINTMENT | Freq: Once | TRANSDERMAL | Status: AC
  Administered 2024-03-07: 1 [in_us] via TOPICAL
  Filled 2024-03-07: qty 1

## 2024-03-07 MED ORDER — OSELTAMIVIR PHOSPHATE 75 MG PO CAPS
75.0000 mg | ORAL_CAPSULE | Freq: Two times a day (BID) | ORAL | Status: DC
  Administered 2024-03-07 – 2024-03-09 (×5): 75 mg via ORAL
  Filled 2024-03-07 (×6): qty 1

## 2024-03-07 MED ORDER — ALBUTEROL SULFATE (2.5 MG/3ML) 0.083% IN NEBU
10.0000 mg/h | INHALATION_SOLUTION | Freq: Once | RESPIRATORY_TRACT | Status: AC
  Administered 2024-03-07: 10 mg/h via RESPIRATORY_TRACT
  Filled 2024-03-07: qty 12

## 2024-03-07 MED ORDER — METOPROLOL TARTRATE 25 MG PO TABS
25.0000 mg | ORAL_TABLET | Freq: Two times a day (BID) | ORAL | Status: DC
  Administered 2024-03-07 (×2): 25 mg via ORAL
  Filled 2024-03-07 (×2): qty 1

## 2024-03-07 MED ORDER — FLUTICASONE PROPIONATE 50 MCG/ACT NA SUSP
1.0000 | Freq: Every day | NASAL | Status: DC
  Administered 2024-03-09 – 2024-03-14 (×6): 1 via NASAL
  Filled 2024-03-07: qty 16

## 2024-03-07 MED ORDER — APIXABAN 5 MG PO TABS
5.0000 mg | ORAL_TABLET | Freq: Two times a day (BID) | ORAL | Status: DC
  Administered 2024-03-07 (×2): 5 mg via ORAL
  Filled 2024-03-07 (×2): qty 1

## 2024-03-07 MED ORDER — DILTIAZEM HCL ER COATED BEADS 120 MG PO CP24
120.0000 mg | ORAL_CAPSULE | Freq: Every day | ORAL | Status: DC
  Administered 2024-03-07: 120 mg via ORAL
  Filled 2024-03-07 (×2): qty 1

## 2024-03-07 MED ORDER — ARFORMOTEROL TARTRATE 15 MCG/2ML IN NEBU
15.0000 ug | INHALATION_SOLUTION | Freq: Two times a day (BID) | RESPIRATORY_TRACT | Status: DC
  Administered 2024-03-07 – 2024-03-08 (×2): 15 ug via RESPIRATORY_TRACT
  Filled 2024-03-07 (×2): qty 2

## 2024-03-07 MED ORDER — SODIUM CHLORIDE 0.9 % IV SOLN
500.0000 mg | Freq: Once | INTRAVENOUS | Status: AC
  Administered 2024-03-07: 500 mg via INTRAVENOUS
  Filled 2024-03-07: qty 5

## 2024-03-07 MED ORDER — HYDROXYZINE HCL 25 MG PO TABS
25.0000 mg | ORAL_TABLET | Freq: Three times a day (TID) | ORAL | Status: DC | PRN
  Administered 2024-03-07: 25 mg via ORAL
  Filled 2024-03-07: qty 1

## 2024-03-07 MED ORDER — SODIUM CHLORIDE 0.9 % IV SOLN
2.0000 g | Freq: Once | INTRAVENOUS | Status: AC
  Administered 2024-03-07: 2 g via INTRAVENOUS
  Filled 2024-03-07: qty 20

## 2024-03-07 MED ORDER — ATORVASTATIN CALCIUM 40 MG PO TABS
40.0000 mg | ORAL_TABLET | Freq: Every day | ORAL | Status: DC
  Administered 2024-03-07 – 2024-03-13 (×6): 40 mg via ORAL
  Filled 2024-03-07 (×6): qty 1

## 2024-03-07 MED ORDER — PREDNISONE 20 MG PO TABS: 50.0000 mg | ORAL_TABLET | Freq: Every day | ORAL | Status: DC

## 2024-03-07 MED ORDER — HYDROXYZINE HCL 25 MG PO TABS
50.0000 mg | ORAL_TABLET | Freq: Three times a day (TID) | ORAL | Status: DC | PRN
  Administered 2024-03-07: 50 mg via ORAL
  Filled 2024-03-07: qty 2

## 2024-03-07 MED ORDER — IPRATROPIUM BROMIDE 0.02 % IN SOLN
RESPIRATORY_TRACT | Status: AC
  Filled 2024-03-07: qty 2.5

## 2024-03-07 MED ORDER — IPRATROPIUM BROMIDE 0.02 % IN SOLN
0.5000 mg | Freq: Once | RESPIRATORY_TRACT | Status: AC
  Administered 2024-03-07: 0.5 mg via RESPIRATORY_TRACT

## 2024-03-07 MED ORDER — OSELTAMIVIR PHOSPHATE 75 MG PO CAPS: 75.0000 mg | ORAL_CAPSULE | Freq: Two times a day (BID) | ORAL | Status: DC

## 2024-03-07 MED ORDER — BUPROPION HCL ER (SR) 150 MG PO TB12
150.0000 mg | ORAL_TABLET | Freq: Two times a day (BID) | ORAL | Status: DC
  Administered 2024-03-07 (×2): 150 mg via ORAL
  Filled 2024-03-07 (×3): qty 1

## 2024-03-07 MED ORDER — IPRATROPIUM-ALBUTEROL 0.5-2.5 (3) MG/3ML IN SOLN: 3.0000 mL | Freq: Four times a day (QID) | RESPIRATORY_TRACT | Status: DC

## 2024-03-07 MED ORDER — BUDESONIDE 0.25 MG/2ML IN SUSP
0.2500 mg | Freq: Two times a day (BID) | RESPIRATORY_TRACT | Status: DC
  Administered 2024-03-07 – 2024-03-12 (×10): 0.25 mg via RESPIRATORY_TRACT
  Filled 2024-03-07 (×10): qty 2

## 2024-03-07 MED ORDER — ALBUTEROL SULFATE (2.5 MG/3ML) 0.083% IN NEBU
INHALATION_SOLUTION | RESPIRATORY_TRACT | Status: AC
  Filled 2024-03-07: qty 12

## 2024-03-07 MED ORDER — NICOTINE 21 MG/24HR TD PT24
21.0000 mg | MEDICATED_PATCH | TRANSDERMAL | Status: DC
  Administered 2024-03-07: 21 mg via TRANSDERMAL
  Filled 2024-03-07: qty 1

## 2024-03-07 MED ORDER — AZITHROMYCIN 500 MG PO TABS
500.0000 mg | ORAL_TABLET | Freq: Every day | ORAL | Status: DC
  Filled 2024-03-07: qty 1

## 2024-03-07 MED ORDER — ALBUTEROL SULFATE (2.5 MG/3ML) 0.083% IN NEBU
10.0000 mg/h | INHALATION_SOLUTION | RESPIRATORY_TRACT | Status: DC
  Administered 2024-03-07: 10 mg/h via RESPIRATORY_TRACT

## 2024-03-07 MED ORDER — GUAIFENESIN-DM 100-10 MG/5ML PO SYRP
5.0000 mL | ORAL_SOLUTION | ORAL | Status: DC | PRN
  Administered 2024-03-07 – 2024-03-12 (×6): 5 mL via ORAL
  Filled 2024-03-07 (×6): qty 5

## 2024-03-07 MED ORDER — IPRATROPIUM-ALBUTEROL 0.5-2.5 (3) MG/3ML IN SOLN
3.0000 mL | Freq: Once | RESPIRATORY_TRACT | Status: AC
  Administered 2024-03-07: 3 mL via RESPIRATORY_TRACT
  Filled 2024-03-07: qty 3

## 2024-03-07 NOTE — ED Notes (Signed)
 CCMD called.

## 2024-03-07 NOTE — TOC CM/SW Note (Addendum)
 TOC consult received for BiPAP. Per previous CM note on 11/17, [employer] Liaison states they have the ABG results to submit to insurance. Patient has [employer]- and the insurance has up to 3-14 days to authorize DME. [employer] Liaison states RT will assess the patient in the home once the DME has been approved.   CM sent message to [employer] rep at [phone] to request status update. Awaiting response.   Merilee Batty, MSN, RN Case Management [phone]: [employer] rep informed CM that no updates have been received from patient's insurance regarding BiPAP. He reiterated that auth can take between 3 and 14 days to receive. MD updated via Epic chat.

## 2024-03-07 NOTE — ED Provider Notes (Signed)
 [place] EMERGENCY DEPARTMENT AT [facility] Provider Note   CSN: [id number] Arrival date & time: 03/07/24  9065     History No chief complaint on file.   HPI: Francisco [name] is a 63 y.o. male with history pertinent for COPD, hypertension, MI with previous PCI, CHF, a flutter who presents complaining of respiratory distress. Patient arrived via EMS from home.  History provided by patient and EMS.  No interpreter required during this encounter.  Patient presents to the emergency department for shortness of breath.  Reports that he has had worsening shortness of breath since his discharge.  Denies fever, chills, chest pain, nausea, vomiting, diarrhea.  EMS reports that EMS was previously called out to his house earlier today, administered DuoNebs, however patient reportedly declined transport at this time.  They were called back out for respiratory distress, states that they found him cyanotic, hypoxic on home 2 L nasal cannula, significantly increased work of breathing.  Reports that they put him on CPAP en route, otherwise was significantly hypertensive en route.  Received Solu-Medrol , magnesium  and DuoNebs prior to arrival.  On arrival, patient GCS 15, complains of respiratory distress, confirms that he is full code.  [name] recorded medical, surgical, social, medication list and allergies were reviewed in the Snapshot window as part of the initial history.   Prior to Admission medications   Medication Sig Start Date End Date Taking? Authorizing Provider  albuterol  (PROVENTIL ) (2.5 MG/3ML) 0.083% nebulizer solution Take 2.5 mg by nebulization every 6 (six) hours as needed for wheezing or shortness of breath.    [provider]  albuterol  (VENTOLIN  HFA) 108 (90 Base) MCG/ACT inhaler Inhale 1-2 puffs into the lungs every 6 (six) hours as needed for wheezing or shortness of breath. 03/06/24   [name], [name], DO  Albuterol -Budesonide  90-80 MCG/ACT AERO Inhale 2 puffs into  the lungs every 4 (four) hours as needed (wheeze, shortness of breath). 01/23/24   Hunsucker, [name], MD  apixaban  (ELIQUIS ) 5 MG TABS tablet Take 1 tablet (5 mg total) by mouth 2 (two) times daily. 02/01/24   Marylu Gee, DO  atorvastatin  (LIPITOR ) 40 MG tablet Take 1 tablet (40 mg total) by mouth daily. 02/01/24   Marylu Gee, DO  benzonatate  (TESSALON ) 200 MG capsule Take 1 capsule (200 mg total) by mouth 3 (three) times daily as needed for cough. 02/01/24   Amilibia, Jaden, DO  buPROPion  (WELLBUTRIN  SR) 150 MG 12 hr tablet Take 1 tablet (150 mg total) by mouth 2 (two) times daily. Take 1 tablet once daily for the first 3 days (on 10/15-10/17), then take one tablet twice daily (10/18 and onward) 02/01/24   Amilibia, Jaden, DO  diltiazem  (CARDIZEM  CD) 120 MG 24 hr capsule Take 1 capsule (120 mg total) by mouth daily. 02/01/24   Marylu Gee, DO  Dupilumab  (DUPIXENT ) 300 MG/2ML SOAJ Inject 300 mg into the skin every 14 (fourteen) days. Patient taking differently: Inject 300 mg into the skin See admin instructions. Inject 2mL (300mg ) into the skin every other Monday. 01/26/24   Hunsucker, [name], MD  fluticasone  (FLONASE ) 50 MCG/ACT nasal spray Place 1 spray into both nostrils daily. Patient taking differently: Place 1 spray into both nostrils daily as needed for rhinitis or allergies. 01/25/24   [name], [name], DO  Fluticasone -Umeclidin-Vilant (TRELEGY ELLIPTA ) 200-62.5-25 MCG/ACT AEPB Inhale 1 puff into the lungs daily. 03/05/24 05/04/24  Azadegan, Maryam, MD  guaiFENesin -dextromethorphan  (ROBITUSSIN DM) 100-10 MG/5ML syrup Take 15 mLs by mouth 4 (four) times daily. Patient  not taking: Reported on 03/02/2024 02/01/24   Amilibia, Jaden, DO  hydrOXYzine  (ATARAX ) 25 MG tablet Take 1 tablet (25 mg total) by mouth 3 (three) times daily as needed for anxiety (Can take extra dose PRN not exceeding 50 mg TID). 03/05/24   Azadegan, Maryam, MD  ipratropium-albuterol  (DUONEB) 0.5-2.5 (3) MG/3ML SOLN  Take 3 mLs by nebulization every 4 (four) hours as needed. 02/28/24   Renne Homans, MD  loratadine  (CLARITIN ) 10 MG tablet Take 1 tablet (10 mg total) by mouth daily. 11/28/23 11/27/24  [name], [name], DO  losartan  (COZAAR ) 50 MG tablet Take 1 tablet (50 mg total) by mouth daily. 02/01/24   Marylu Gee, DO  metoprolol  tartrate (LOPRESSOR ) 25 MG tablet Take 1 tablet (25 mg total) by mouth 2 (two) times daily. 02/01/24   Marylu Gee, DO  nicotine  (NICODERM CQ  - DOSED IN MG/24 HOURS) 21 mg/24hr patch Place 1 patch (21 mg total) onto the skin daily. 02/28/24 02/27/25  Amoako, Prince, MD  nitroGLYCERIN  (NITROSTAT ) 0.4 MG SL tablet Place 1 tablet (0.4 mg total) under the tongue every 5 (five) minutes x 3 doses as needed for chest pain. 11/17/23   [name], [name], DO  OXYGEN  Inhale 2-4 L/min into the lungs See admin instructions. Inhale 2 L/min continuously (baseline). May increase up to 4 L/min if needed for poor oxygen  saturation or exertion.    [provider]  predniSONE  (DELTASONE ) 10 MG tablet Take 4 tablets (40 mg total) by mouth daily with breakfast for 4 days, THEN 3 tablets (30 mg total) daily with breakfast for 5 days, THEN 2 tablets (20 mg total) daily with breakfast for 5 days, THEN 1 tablet (10 mg total) daily with breakfast for 5 days. 03/06/24 03/25/24  Azadegan, Maryam, MD  thiamine  (VITAMIN B-1) 100 MG tablet Take 1 tablet (100 mg total) by mouth daily. 08/15/23   Krishnan, Gokul, MD     Allergies: Ivp dye [iodinated contrast media], Ultram [tramadol], and Vibramycin  [doxycycline ]   Review of Systems   ROS as per HPI  Physical Exam Updated Vital Signs There were no vitals taken for this visit. Physical Exam Constitutional:      General: He is in acute distress.     Appearance: He is ill-appearing, toxic-appearing and diaphoretic.  HENT:     Head: Normocephalic and atraumatic.     Nose: Nose normal.     Mouth/Throat:     Mouth: Mucous membranes are moist.  Eyes:      Extraocular Movements: Extraocular movements intact.  Cardiovascular:     Rate and Rhythm: Regular rhythm. Tachycardia present.     Pulses: Normal pulses.     Heart sounds: Normal heart sounds.  Pulmonary:     Effort: Tachypnea, accessory muscle usage and respiratory distress present.     Breath sounds: Decreased breath sounds, wheezing and rales present.  Abdominal:     General: Abdomen is flat.     Palpations: Abdomen is soft.  Skin:    Capillary Refill: Capillary refill takes less than 2 seconds.  Neurological:     Mental Status: He is alert.     ED Course/ Medical Decision Making/ A&P    Procedures .Critical Care  Performed by: Rogelia Jerilynn RAMAN, MD Authorized by: Rogelia Jerilynn RAMAN, MD   Critical care provider statement:    Critical care time (minutes):  45   Critical care was necessary to treat or prevent imminent or life-threatening deterioration of the following conditions:  Respiratory failure   Critical care  was time spent personally by me on the following activities:  Development of treatment plan with patient or surrogate, discussions with consultants, evaluation of patient's response to treatment, examination of patient, ordering and review of laboratory studies, ordering and review of radiographic studies, ordering and performing treatments and interventions, pulse oximetry, re-evaluation of patient's condition and review of old charts   I assumed direction of critical care for this patient from another provider in my specialty: no     Care discussed with: admitting provider      Medications Ordered in ED Medications - No data to display  Medical Decision Making:   Francisco [name] is a 63 y.o. male who presents for respiratory distress as per above.  Physical exam is pertinent for increased work of breathing, tachycardia, diffuse wheezes and diminished breath sounds.   The differential includes but is not limited to PD exacerbation, pneumonia, sepsis, volume  overload, ACS.  Independent historian: EMS  External data reviewed: Notes: Reviewed patient's prior ED notes and prior inpatient notes, patient has had numerous similar prior hospitalizations for the same, most recently was discharged on 11/17, and was discharged per patient preference) prior to his BiPAP being available at home  Labs: Ordered, Independent interpretation, and Details: VBG with hypercarbia to greater than 100, severe acidosis to 7.198. Lactic acid WNL at 1.8.  RVP ***.  PT/INR ***.  UA ***.  BNP ***.  Troponin ***.  CBC ***.  CMP ***.  Radiology: Ordered, Independent interpretation, Details: ***, and All images reviewed independently. ***Agree with radiology report at this time.   DG Chest Port 1 View Result Date: 03/03/2024 CLINICAL DATA:  Respiratory distress with dry cough. EXAM: PORTABLE CHEST 1 VIEW COMPARISON:  March 01, 2024 FINDINGS: The heart size and mediastinal contours are within normal limits. Both lungs are clear. The visualized skeletal structures are unremarkable. IMPRESSION: No active disease. Electronically Signed   By: Suzen Dials M.D.   On: 03/03/2024 21:47   DG Chest Port 1 View Result Date: 03/01/2024 EXAM: 1 VIEW(S) XRAY OF THE CHEST 03/01/2024 07:07:04 AM COMPARISON: 02/27/2024 CLINICAL HISTORY: resp distress FINDINGS: LUNGS AND PLEURA: No focal pulmonary opacity. No pleural effusion. No pneumothorax. HEART AND MEDIASTINUM: No acute abnormality of the cardiac and mediastinal silhouettes. BONES AND SOFT TISSUES: No acute osseous abnormality. IMPRESSION: 1. No acute process. Electronically signed by: Evalene Coho MD 03/01/2024 07:12 AM EST RP Workstation: [id number]   DG Chest Portable 1 View Result Date: 02/27/2024 CLINICAL DATA:  Shortness of breath EXAM: PORTABLE CHEST 1 VIEW COMPARISON:  02/26/2024, CT 11/28/2023 FINDINGS: The heart size and mediastinal contours are within normal limits. Both lungs are clear. The visualized skeletal structures  are unremarkable. IMPRESSION: No active disease. Electronically Signed   By: Luke Bun M.D.   On: 02/27/2024 22:15   DG Chest Port 1 View Result Date: 02/26/2024 CLINICAL DATA:  Shortness of breath, respiratory distress EXAM: PORTABLE CHEST 1 VIEW COMPARISON:  02/15/2024 FINDINGS: The heart size and mediastinal contours are within normal limits. Mild hyperinflation noted. Both lungs are clear. The visualized skeletal structures are unremarkable. IMPRESSION: Mild hyperinflation. No acute process. Electronically Signed   By: CHRISTELLA.  Shick M.D.   On: 02/26/2024 08:16   DG Chest Portable 1 View Result Date: 02/15/2024 EXAM: 1 VIEW(S) XRAY OF THE CHEST 02/15/2024 01:04:00 AM COMPARISON: 01/30/2024 CLINICAL HISTORY: SOB. Encounter for shortness of breath. FINDINGS: LUNGS AND PLEURA: No focal pulmonary opacity. No pulmonary edema. No pleural effusion. No pneumothorax. HEART AND MEDIASTINUM: No acute  abnormality of the cardiac and mediastinal silhouettes. BONES AND SOFT TISSUES: No acute osseous abnormality. IMPRESSION: 1. No acute process. Electronically signed by: Dorethia Molt MD 02/15/2024 01:11 AM EDT RP Workstation: [id number]    EKG/Medicine tests: Ordered and Independent interpretation EKG Interpretation:                  Interventions: Continuous albuterol , ceftriaxone , azithromycin , nitroglycerin   See the EMR for full details regarding lab and imaging results.  Patient presents in respiratory distress, hypertensive, diffuse wheezing.  Patient has frequent presentations for respiratory distress.  Patient meets sepsis criteria given new hypoxia, tachycardia, shortness of breath, therefore activated as a code sepsis with azithromycin  and ceftriaxone  for pneumonia coverage.  Patient already received DuoNebs and route, therefore will start on continuous albuterol  as well as initiated on BiPAP soon after arrival.  Patient warrants broad spectrum labs, chest x-ray.  Additionally given patient is  hypertensive to the 200s, considered flash pulmonary edema, will administer nitroglycerin  and reevaluate.  Labs and imaging demonstrate ***  {LSCOPA:33420}  Discussion of management or test interpretations with external provider(s): ***  Risk Drugs:{LSDRUGS:33399} Treatment: {LSTREATMENT:33409} Surgery:{LSSURGERY:33410} Critical Care: 45 minutes  Disposition: {LSDISPO:33388}  MDM generated using voice dictation software and may contain dictation errors.  Please contact me for any clarification or with any questions.  Clinical Impression: No diagnosis found.   Data Unavailable   Final Clinical Impression(s) / ED Diagnoses Final diagnoses:  None    Rx / DC Orders ED Discharge Orders     None

## 2024-03-07 NOTE — Progress Notes (Incomplete)
   HD#0 SUBJECTIVE:  Patient Summary: Francisco [name] is a 63 y.o. with a pertinent PMH of ***, who presented with *** and admitted for ***.   Overnight Events: ***  Interim History: ***  OBJECTIVE:  Vital Signs: Vitals:   03/07/24 0941 03/07/24 0945 03/07/24 1145 03/07/24 1200  BP:  (!) 174/109 126/76 133/89  Pulse:  (!) 115 100 99  Resp:  (!) 32 20 20  SpO2: 98% 100% 100% 100%   Supplemental O2: {NAMES:3044014::Room Air,Nasal Cannula,Simple Face Mask,Partial Rebreather,HFNC,Non Rebreather,Venturi Mask,Bag Valve Mask} SpO2: 100 % FiO2 (%): 100 %  There were no vitals filed for this visit.  No intake or output data in the 24 hours ending 03/07/24 1507 Net IO Since Admission: No IO data has been entered for this period [03/07/24 1507]  Physical Exam: Physical Exam  Patient Lines/Drains/Airways Status     Active Line/Drains/Airways     Name Placement date Placement time Site Days   Peripheral IV 03/03/24 20 G Anterior;Left;Upper Arm 03/03/24  2143  Arm  4   Peripheral IV 03/07/24 18 G Left Antecubital 03/07/24  0917  Antecubital  less than 1   Peripheral IV 03/07/24 20 G Anterior;Distal;Right;Upper Arm 03/07/24  0930  Arm  less than 1            Pertinent labs and imaging:  ***    Latest Ref Rng & Units 03/07/2024   12:27 PM 03/07/2024    9:59 AM 03/07/2024    9:58 AM  CBC  Hemoglobin 13.0 - 17.0 g/dL 84.6  82.2  82.6   Hematocrit 39.0 - 52.0 % 45.0  52.0  51.0        Latest Ref Rng & Units 03/07/2024   12:27 PM 03/07/2024    9:59 AM 03/07/2024    9:58 AM  CMP  Glucose 70 - 99 mg/dL   747   BUN 8 - 23 mg/dL   26   Creatinine 9.38 - 1.24 mg/dL   8.59   Sodium 864 - 854 mmol/L 138  139  140   Potassium 3.5 - 5.1 mmol/L 5.4  5.1  5.2   Chloride 98 - 111 mmol/L   94     DG Chest Port 1 View Result Date: 03/07/2024 CLINICAL DATA:  Concern for sepsis. EXAM: PORTABLE CHEST 1 VIEW COMPARISON:  03/03/2024. FINDINGS: The heart size and  mediastinal contours are within normal limits. Both lungs are clear. No pleural effusion or pneumothorax. No acute osseous abnormality. IMPRESSION: No acute cardiopulmonary findings. Electronically Signed   By: Harrietta Sherry M.D.   On: 03/07/2024 10:12    ASSESSMENT/PLAN:  Assessment: Active Problems:   COPD with acute exacerbation (HCC)   Plan: #*** ***  #*** ***  #*** ***  Best Practice: Diet: {CHL DISCHARGE DIET:21201} IVF: Fluids: {Meds; iv fluids:31617}, Rate: {NAMES:3044014::None,*** cc/hr x *** hrs,*** cc bolus} VTE:  Code: {NAMES:3044014::Full,DNR,DNI,DNR/DNI,Comfort Care,Unknown}  Disposition planning: Therapy Recs: {NAMES:3044014::None,Pending,CIR,SNF,ALF,LTAC,Home Health}, DME: {Assistive Devices IFZ:80464} Family Contact: ***, {Family:304960261::to be notified.} DISPO: Anticipated discharge {NAMES:3044014::today,tomorrow,in *** days} to {Discharge Destination:18313::Home} pending {BARRIERS TO DISCHARGE:24135}.  Signature:  Gershom Brobeck [facility]  3:07 PM, 03/07/2024  On Call pager [phone]

## 2024-03-07 NOTE — H&P (Signed)
 Date: 03/07/2024               Patient Name:  Francisco [name] MRN: [id number]  DOB: 11/14/1960 Age / Sex: 63 y.o., male   PCP: Heddy Barren, DO         Medical Service: Internal Medicine Teaching Service         Attending Physician: Dr. Lovie Clarity, MD      First Contact: Dr. Armando Rossetti, MD     Second Contact: Dr. Missy Sandhoff, MD         After Hours (After 5p/  First Contact Pager: [phone]  weekends / holidays): Second Contact Pager: [phone]   SUBJECTIVE   Chief Complaint: shortness of breath   History of Present Illness:   Francisco [name] is a 63 year old male with past medical history of severe COPD, tobacco use disorder, chronic hypoxic respiratory failure on 2 L of home oxygen , heart failure with recovered ejection fraction, coronary artery disease status post stent placement atrial fibrillation anticoagulated with Eliquis  who is presenting with shortness of breath.  He was recently admitted on November 15, and discharged on November 17 (as he was very interested in going home) for similar symptoms.  He states that his breathing never fully recovered from when he was in the hospital, and still had trouble breathing going from his bed to his kitchen and then to his bathroom.  This morning around 5:00, he presented with worsening dyspnea at rest, and after discussion with his roommate at the time he decided to come to the emergency department.  Of note, he states that he has been taking his medications which include his steroid taper and nebulizers, he is still smoking half a pack a day.   Meds:  Ventolin  inhaler PRN Trelegy 200 Dupixent  q14 days Prednisone  taper 50mg  DuoNebs PRN Eliquis  5 mg BID Diltiazem  120 mg daily Metoprolol  tartrate 25 mg BID Losartan  50 mg daily Atorvastatin  40 mg daily Bupropion SR Loratadine  Flonase  Guaifenesin  Sublingual nitroglycerin  PRN 2 L home oxygen    Past Medical History  Past Surgical History:  Procedure Laterality Date    BACK SURGERY     NECK SURGERY     RIGHT/LEFT HEART CATH AND CORONARY ANGIOGRAPHY N/A 04/30/2019   Procedure: RIGHT/LEFT HEART CATH AND CORONARY ANGIOGRAPHY;  Surgeon: Anner Alm ORN, MD;  Location: [facility] INVASIVE CV LAB;  Service: Cardiovascular;  Laterality: N/A;    Social:  Lives With: Roommate at home Occupation: Does not work Support: Roommate Level of Function: Independent in all ADLs and IADLs are not acutely ill PCP: Dr. Barren Heddy Substances: Half a pack of cigarettes a day  Family History:   Father with history of stroke  Allergies: Allergies as of 03/07/2024 - Review Complete 03/07/2024  Allergen Reaction Noted   Ivp dye [iodinated contrast media] Other (See Comments) 06/12/2018   Ultram [tramadol] Other (See Comments) 06/12/2018   Vibramycin  [doxycycline ] Other (See Comments) 02/01/2023    Review of Systems: A complete ROS was negative except as per HPI.   OBJECTIVE:   Physical Exam: Blood pressure 133/89, pulse 99, resp. rate 20, SpO2 100%.  Constitutional: Getting DuoNeb treatment, laying in bed, in no acute distress HENT: normocephalic atraumatic, mucous membranes moist Eyes: conjunctiva non-erythematous Neck: supple Cardiovascular: regular rate and rhythm, no m/r/g Pulmonary/Chest: On 3 L of oxygen , getting breathing treatment.  Wheezes heard throughout airfield, able to speak in full sentences, no accessory muscle use Abdominal: soft, non-tender, non-distended MSK: normal bulk and tone Neurological: alert &  oriented x 3, 5/5 strength in bilateral upper and lower extremities, normal gait Skin: warm and dry Psych: Normal mood and affect  Labs: CBC    Component Value Date/Time   WBC 22.7 (H) 03/07/2024 0946   RBC 5.05 03/07/2024 0946   HGB 15.3 03/07/2024 1227   HGB 15.5 12/06/2023 1528   HCT 45.0 03/07/2024 1227   HCT 47.1 12/06/2023 1528   PLT 292 03/07/2024 0946   PLT 285 12/06/2023 1528   MCV 99.4 03/07/2024 0946   MCV 97 12/06/2023  1528   MCH 31.9 03/07/2024 0946   MCHC 32.1 03/07/2024 0946   RDW 13.7 03/07/2024 0946   RDW 13.6 12/06/2023 1528   LYMPHSABS 2.0 03/07/2024 0946   LYMPHSABS 1.7 05/18/2021 1518   MONOABS 0.9 03/07/2024 0946   EOSABS 0.0 03/07/2024 0946   EOSABS 0.0 05/18/2021 1518   BASOSABS 0.0 03/07/2024 0946   BASOSABS 0.0 05/18/2021 1518     CMP     Component Value Date/Time   NA 138 03/07/2024 1227   NA 144 12/06/2023 1528   K 5.4 (H) 03/07/2024 1227   CL 94 (L) 03/07/2024 0958   CO2 33 (H) 03/07/2024 0946   GLUCOSE 252 (H) 03/07/2024 0958   BUN 26 (H) 03/07/2024 0958   BUN 9 12/06/2023 1528   CREATININE 1.40 (H) 03/07/2024 0958   CALCIUM  9.5 03/07/2024 0946   PROT 6.8 03/07/2024 0946   PROT 6.9 12/06/2023 1528   ALBUMIN  4.0 03/07/2024 0946   ALBUMIN  4.1 12/06/2023 1528   AST 28 03/07/2024 0946   ALT 53 (H) 03/07/2024 0946   ALKPHOS 90 03/07/2024 0946   BILITOT 0.9 03/07/2024 0946   BILITOT 0.5 12/06/2023 1528   GFRNONAA 52 (L) 03/07/2024 0946   GFRAA >60 01/11/2020 0422    Imaging:  Chest x-ray: No active cardiopulmonary disease  EKG: personally reviewed my interpretation is sinus tachycardia  ASSESSMENT & PLAN:   Assessment & Plan by Problem: Active Problems:   COPD with acute exacerbation (HCC)   Francisco [name] is a 63 year old male with severe COPD, chronic hypoxic respiratory failure, tobacco and meth use, PAH, and heart disease who presents with worsening dyspnea, consistent with a COPD exacerbation.   #COPD Exacerbation #Acute on Chronic Hypoxic Respiratory Failure #Severe Pulmonary HTN Patient presents very quickly after discharge, with likely COPD exacerbation in the setting of his respiratory failure, severe pulmonary hypertension, as well as his ongoing tobacco use.  Pulmonology consulted in last visit recommending trialing nebulizers scheduled versus inhaler therapy.  Will trial this while he was here.  He also has not received his BiPAP machine at home yet.   At this time, I think he would benefit from scheduled nebulizer therapy, until his BiPAP machine is delivered.  Plan: - Scheduled DuoNebs 3 times daily - Scheduled Pulmicort  twice daily - Scheduled Brovana  twice daily - Prednisone  50 mg, will need to restart his prednisone  taper - Azithromycin  500 mg for 3 days, may consider increasing this based off of his clinical response - BiPAP nightly  #Essential Hypertension Continuing his losartan , diltiazem , metoprolol .  Elevated at this time due to severe respiratory distress most likely   # Paroxysmal atrial fibrillation Continuing his Eliquis  5 mg twice a day, diltiazem , metoprolol   # Dilated Cardiomyopathy, now recovered EF Prior EF 25-30% (2021); now 60-65% (2025). Not volume overloaded. Plan: - Continue home GDMT - Monitor volume status  # CAD s/p PCI Chest symptoms non-cardiac and resolved. Plan: - Continue atorvastatin  40 mg daily  #  Tobacco Use Disorder Active smoker. Plan: - Continue nicotine  patch - Smoking cessation counseling  # HLD - Continue statin.   Diet: Normal VTE: DOAC IVF: None,None Code: Full  Prior to Admission Living Arrangement: Home, living roommate Anticipated Discharge Location: Home Barriers to Discharge: Medical management  Dispo: Admit patient to Inpatient with expected length of stay greater than 2 midnights.  Signed: Kaeley Vinje, MD Internal Medicine Resident PGY-3  03/07/2024, 2:00 PM

## 2024-03-07 NOTE — ED Triage Notes (Signed)
 Pt BIB [facility] as Respiratory Distress r/t COPD exacerbation from home.[facility] had been out to house earlier and given nebs but he became worse after that.  EMS endorses silent chests on scene.   Fire  gave 1 albuterol . EMS gave 1 duoneb, 2g Mag, 125 Solumedrol 2G Epi IM.  240/150 97% CPAP, JR 130 CBG 227

## 2024-03-07 NOTE — Telephone Encounter (Signed)
 Pharmacy Patient Advocate Encounter  Insurance verification completed.    The patient is insured through [employer]    Ran test claim for Eliquis  5mg  and it is refill too soon due to being filled at [facility] on 03/06/24 ( copay was $4)   This test claim was processed through [facility]- copay amounts may vary at other pharmacies due to pharmacy/plan contracts, or as the patient moves through the different stages of their insurance plan.

## 2024-03-07 NOTE — Transitions of Care (Post Inpatient/ED Visit) (Signed)
   03/07/2024  Name: Francisco [name] MRN: [id number] DOB: May 02, 1960  Today's TOC FU Call Status: Today's TOC FU Call Status:: Unsuccessful Call (2nd Attempt) Unsuccessful Call (1st Attempt) Date: 03/05/24 Unsuccessful Call (2nd Attempt) Date: 03/07/24  Attempted to reach the patient regarding the most recent Inpatient/ED visit.  Follow Up Plan: Additional outreach attempts will be made to reach the patient to complete the Transitions of Care (Post Inpatient/ED visit) call.   Signature Julian Lemmings, LPN [facility] Nurse Health Advisor Direct Dial [phone]

## 2024-03-07 NOTE — ED Notes (Signed)
 Pt given urinal. Pt encouraged not to get out of bed.

## 2024-03-07 NOTE — Progress Notes (Signed)
 Pt placed on NIV with a 10mg  CAT inline due to severe respiratory distress.

## 2024-03-07 NOTE — ED Notes (Signed)
 Pt was sitting on the edge of the bed and became SOB. RT is at bedside at this time. MD made aware. Pt encouraged again not to get out bed when using urinal and to request for assistance.

## 2024-03-07 NOTE — Transitions of Care (Post Inpatient/ED Visit) (Signed)
   03/07/2024  Name: Francisco [name] MRN: [id number] DOB: 1960-10-09  Today's TOC FU Call Status: Today's TOC FU Call Status:: Unsuccessful Call (2nd Attempt) Unsuccessful Call (1st Attempt) Date: 03/06/24 Unsuccessful Call (2nd Attempt) Date: 03/07/24  Attempted to reach the patient regarding the most recent Inpatient/ED visit.  Follow Up Plan: Additional outreach attempts will be made to reach the patient to complete the Transitions of Care (Post Inpatient/ED visit) call.   Medford Balboa, BSN, RN Carl  VBCI - [employer] [facility] RN Care Manager [phone]

## 2024-03-07 NOTE — Sepsis Progress Note (Signed)
 Elink will follow per sepsis protocol.

## 2024-03-08 ENCOUNTER — Inpatient Hospital Stay (HOSPITAL_COMMUNITY)

## 2024-03-08 DIAGNOSIS — R0603 Acute respiratory distress: Secondary | ICD-10-CM

## 2024-03-08 DIAGNOSIS — R079 Chest pain, unspecified: Secondary | ICD-10-CM

## 2024-03-08 DIAGNOSIS — I739 Peripheral vascular disease, unspecified: Secondary | ICD-10-CM

## 2024-03-08 DIAGNOSIS — R609 Edema, unspecified: Secondary | ICD-10-CM | POA: Diagnosis not present

## 2024-03-08 DIAGNOSIS — J9600 Acute respiratory failure, unspecified whether with hypoxia or hypercapnia: Secondary | ICD-10-CM | POA: Diagnosis not present

## 2024-03-08 DIAGNOSIS — F1510 Other stimulant abuse, uncomplicated: Secondary | ICD-10-CM

## 2024-03-08 DIAGNOSIS — Z9981 Dependence on supplemental oxygen: Secondary | ICD-10-CM | POA: Diagnosis not present

## 2024-03-08 DIAGNOSIS — F17210 Nicotine dependence, cigarettes, uncomplicated: Secondary | ICD-10-CM | POA: Diagnosis not present

## 2024-03-08 DIAGNOSIS — J101 Influenza due to other identified influenza virus with other respiratory manifestations: Secondary | ICD-10-CM

## 2024-03-08 DIAGNOSIS — Z792 Long term (current) use of antibiotics: Secondary | ICD-10-CM

## 2024-03-08 DIAGNOSIS — Z79899 Other long term (current) drug therapy: Secondary | ICD-10-CM | POA: Diagnosis not present

## 2024-03-08 DIAGNOSIS — J9622 Acute and chronic respiratory failure with hypercapnia: Secondary | ICD-10-CM | POA: Diagnosis not present

## 2024-03-08 DIAGNOSIS — J9621 Acute and chronic respiratory failure with hypoxia: Secondary | ICD-10-CM

## 2024-03-08 DIAGNOSIS — Z7952 Long term (current) use of systemic steroids: Secondary | ICD-10-CM

## 2024-03-08 DIAGNOSIS — I480 Paroxysmal atrial fibrillation: Secondary | ICD-10-CM | POA: Diagnosis not present

## 2024-03-08 DIAGNOSIS — Z7951 Long term (current) use of inhaled steroids: Secondary | ICD-10-CM

## 2024-03-08 DIAGNOSIS — R918 Other nonspecific abnormal finding of lung field: Secondary | ICD-10-CM | POA: Diagnosis not present

## 2024-03-08 DIAGNOSIS — J441 Chronic obstructive pulmonary disease with (acute) exacerbation: Secondary | ICD-10-CM | POA: Diagnosis not present

## 2024-03-08 LAB — POCT I-STAT 7, (LYTES, BLD GAS, ICA,H+H)
Acid-Base Excess: 11 mmol/L — ABNORMAL HIGH (ref 0.0–2.0)
Acid-Base Excess: 8 mmol/L — ABNORMAL HIGH (ref 0.0–2.0)
Acid-Base Excess: 7 mmol/L — ABNORMAL HIGH (ref 0.0–2.0)
Bicarbonate: 42.6 mmol/L — ABNORMAL HIGH (ref 20.0–28.0)
Bicarbonate: 38.3 mmol/L — ABNORMAL HIGH (ref 20.0–28.0)
Bicarbonate: 36 mmol/L — ABNORMAL HIGH (ref 20.0–28.0)
Calcium, Ion: 1.19 mmol/L (ref 1.15–1.40)
Calcium, Ion: 1.2 mmol/L (ref 1.15–1.40)
Calcium, Ion: 1.14 mmol/L — ABNORMAL LOW (ref 1.15–1.40)
HCT: 46 % (ref 39.0–52.0)
HCT: 42 % (ref 39.0–52.0)
HCT: 40 % (ref 39.0–52.0)
Hemoglobin: 15.6 g/dL (ref 13.0–17.0)
Hemoglobin: 14.3 g/dL (ref 13.0–17.0)
Hemoglobin: 13.6 g/dL (ref 13.0–17.0)
O2 Saturation: 100 %
O2 Saturation: 94 %
O2 Saturation: 86 %
Patient temperature: 98.7
Patient temperature: 98.1
Patient temperature: 98.1
Potassium: 5.2 mmol/L — ABNORMAL HIGH (ref 3.5–5.1)
Potassium: 5.7 mmol/L — ABNORMAL HIGH (ref 3.5–5.1)
Potassium: 5.7 mmol/L — ABNORMAL HIGH (ref 3.5–5.1)
Sodium: 140 mmol/L (ref 135–145)
Sodium: 138 mmol/L (ref 135–145)
Sodium: 138 mmol/L (ref 135–145)
TCO2: 45 mmol/L — ABNORMAL HIGH (ref 22–32)
TCO2: 41 mmol/L — ABNORMAL HIGH (ref 22–32)
TCO2: 38 mmol/L — ABNORMAL HIGH (ref 22–32)
pCO2 arterial: 90.9 mmHg (ref 32–48)
pCO2 arterial: 78 mmHg (ref 32–48)
pCO2 arterial: 67.7 mmHg (ref 32–48)
pH, Arterial: 7.279 — ABNORMAL LOW (ref 7.35–7.45)
pH, Arterial: 7.298 — ABNORMAL LOW (ref 7.35–7.45)
pH, Arterial: 7.332 — ABNORMAL LOW (ref 7.35–7.45)
pO2, Arterial: 301 mmHg — ABNORMAL HIGH (ref 83–108)
pO2, Arterial: 81 mmHg — ABNORMAL LOW (ref 83–108)
pO2, Arterial: 56 mmHg — ABNORMAL LOW (ref 83–108)

## 2024-03-08 LAB — BLOOD GAS, ARTERIAL
Acid-Base Excess: 10.4 mmol/L — ABNORMAL HIGH (ref 0.0–2.0)
Bicarbonate: 42.8 mmol/L — ABNORMAL HIGH (ref 20.0–28.0)
O2 Saturation: 100 %
Patient temperature: 36.5
pCO2 arterial: 105 mmHg (ref 32–48)
pH, Arterial: 7.22 — ABNORMAL LOW (ref 7.35–7.45)
pO2, Arterial: 295 mmHg — ABNORMAL HIGH (ref 83–108)

## 2024-03-08 LAB — CBC
HCT: 45.5 % (ref 39.0–52.0)
Hemoglobin: 14.4 g/dL (ref 13.0–17.0)
MCH: 31.6 pg (ref 26.0–34.0)
MCHC: 31.6 g/dL (ref 30.0–36.0)
MCV: 100 fL (ref 80.0–100.0)
Platelets: 207 K/uL (ref 150–400)
RBC: 4.55 MIL/uL (ref 4.22–5.81)
RDW: 14 % (ref 11.5–15.5)
WBC: 16.7 K/uL — ABNORMAL HIGH (ref 4.0–10.5)
nRBC: 0 % (ref 0.0–0.2)

## 2024-03-08 LAB — MRSA NEXT GEN BY PCR, NASAL: MRSA by PCR Next Gen: NOT DETECTED

## 2024-03-08 LAB — ECHOCARDIOGRAM COMPLETE
Area-P 1/2: 2.21 cm2
S' Lateral: 2.9 cm
Weight: 3296 [oz_av]

## 2024-03-08 LAB — GLUCOSE, CAPILLARY
Glucose-Capillary: 127 mg/dL — ABNORMAL HIGH (ref 70–99)
Glucose-Capillary: 134 mg/dL — ABNORMAL HIGH (ref 70–99)
Glucose-Capillary: 151 mg/dL — ABNORMAL HIGH (ref 70–99)
Glucose-Capillary: 151 mg/dL — ABNORMAL HIGH (ref 70–99)
Glucose-Capillary: 167 mg/dL — ABNORMAL HIGH (ref 70–99)
Glucose-Capillary: 181 mg/dL — ABNORMAL HIGH (ref 70–99)

## 2024-03-08 LAB — BASIC METABOLIC PANEL WITH GFR
Anion gap: 12 (ref 5–15)
BUN: 27 mg/dL — ABNORMAL HIGH (ref 8–23)
CO2: 35 mmol/L — ABNORMAL HIGH (ref 22–32)
Calcium: 8.8 mg/dL — ABNORMAL LOW (ref 8.9–10.3)
Chloride: 96 mmol/L — ABNORMAL LOW (ref 98–111)
Creatinine, Ser: 1.34 mg/dL — ABNORMAL HIGH (ref 0.61–1.24)
GFR, Estimated: 60 mL/min — ABNORMAL LOW (ref >60)
Glucose, Bld: 111 mg/dL — ABNORMAL HIGH (ref 70–99)
Potassium: 5.5 mmol/L — ABNORMAL HIGH (ref 3.5–5.1)
Sodium: 143 mmol/L (ref 135–145)

## 2024-03-08 LAB — LACTIC ACID, PLASMA
Lactic Acid, Venous: 0.8 mmol/L (ref 0.5–1.9)
Lactic Acid, Venous: 0.9 mmol/L (ref 0.5–1.9)
Lactic Acid, Venous: 1 mmol/L (ref 0.5–1.9)
Lactic Acid, Venous: 1.1 mmol/L (ref 0.5–1.9)

## 2024-03-08 LAB — BLOOD GAS, VENOUS
Acid-Base Excess: 11.3 mmol/L — ABNORMAL HIGH (ref 0.0–2.0)
Bicarbonate: 41.8 mmol/L — ABNORMAL HIGH (ref 20.0–28.0)
Drawn by: 66238
O2 Saturation: 100 %
Patient temperature: 37.1
pCO2, Ven: 87 mmHg (ref 44–60)
pH, Ven: 7.29 (ref 7.25–7.43)
pO2, Ven: 206 mmHg — ABNORMAL HIGH (ref 32–45)

## 2024-03-08 LAB — STREP PNEUMONIAE URINARY ANTIGEN: Strep Pneumo Urinary Antigen: NEGATIVE

## 2024-03-08 LAB — CK
Total CK: 92 U/L (ref 49–397)
Total CK: 49 U/L (ref 49–397)

## 2024-03-08 LAB — VAS US ABI WITH/WO TBI
Left ABI: ABSENT
Right ABI: 0.96

## 2024-03-08 MED ORDER — DEXMEDETOMIDINE HCL IN NACL 400 MCG/100ML IV SOLN
0.0000 ug/kg/h | INTRAVENOUS | Status: DC
  Administered 2024-03-08: 0.4 ug/kg/h via INTRAVENOUS

## 2024-03-08 MED ORDER — POLYETHYLENE GLYCOL 3350 17 G PO PACK: 17.0000 g | PACK | Freq: Every day | ORAL | Status: DC

## 2024-03-08 MED ORDER — CHLORHEXIDINE GLUCONATE CLOTH 2 % EX PADS
6.0000 | MEDICATED_PAD | Freq: Every day | CUTANEOUS | Status: DC
  Administered 2024-03-08 – 2024-03-09 (×2): 6 via TOPICAL

## 2024-03-08 MED ORDER — NICOTINE 21 MG/24HR TD PT24: 21.0000 mg | MEDICATED_PATCH | Freq: Every day | TRANSDERMAL | Status: DC

## 2024-03-08 MED ORDER — MIDAZOLAM HCL 2 MG/2ML IJ SOLN
INTRAMUSCULAR | Status: AC
  Filled 2024-03-08: qty 2

## 2024-03-08 MED ORDER — IPRATROPIUM-ALBUTEROL 0.5-2.5 (3) MG/3ML IN SOLN
3.0000 mL | RESPIRATORY_TRACT | Status: DC | PRN
  Administered 2024-03-09 – 2024-03-14 (×5): 3 mL via RESPIRATORY_TRACT
  Filled 2024-03-08 (×5): qty 3

## 2024-03-08 MED ORDER — FUROSEMIDE 10 MG/ML IJ SOLN
40.0000 mg | INTRAMUSCULAR | Status: AC
  Administered 2024-03-08: 40 mg via INTRAVENOUS
  Filled 2024-03-08: qty 4

## 2024-03-08 MED ORDER — METHYLPREDNISOLONE SODIUM SUCC 40 MG IJ SOLR
40.0000 mg | Freq: Two times a day (BID) | INTRAMUSCULAR | Status: DC
  Administered 2024-03-08 – 2024-03-09 (×2): 40 mg via INTRAVENOUS
  Filled 2024-03-08 (×2): qty 1

## 2024-03-08 MED ORDER — LORAZEPAM 2 MG/ML IJ SOLN
0.5000 mg | Freq: Once | INTRAMUSCULAR | Status: AC
  Administered 2024-03-08: 0.5 mg via INTRAVENOUS
  Filled 2024-03-08: qty 1

## 2024-03-08 MED ORDER — VANCOMYCIN HCL 1750 MG/350ML IV SOLN
1750.0000 mg | INTRAVENOUS | Status: DC
  Filled 2024-03-08: qty 350

## 2024-03-08 MED ORDER — FENTANYL CITRATE (PF) 50 MCG/ML IJ SOSY
PREFILLED_SYRINGE | INTRAMUSCULAR | Status: AC
  Filled 2024-03-08: qty 2

## 2024-03-08 MED ORDER — HEPARIN (PORCINE) 25000 UT/250ML-% IV SOLN
1100.0000 [IU]/h | INTRAVENOUS | Status: DC
  Administered 2024-03-08: 1300 [IU]/h via INTRAVENOUS
  Administered 2024-03-09: 950 [IU]/h via INTRAVENOUS
  Filled 2024-03-08 (×2): qty 250

## 2024-03-08 MED ORDER — ROCURONIUM BROMIDE 10 MG/ML (PF) SYRINGE
PREFILLED_SYRINGE | INTRAVENOUS | Status: AC
  Filled 2024-03-08: qty 10

## 2024-03-08 MED ORDER — PERFLUTREN LIPID MICROSPHERE
1.0000 mL | INTRAVENOUS | Status: AC | PRN
  Administered 2024-03-08: 3 mL via INTRAVENOUS

## 2024-03-08 MED ORDER — SODIUM CHLORIDE 0.9 % IV SOLN
2.0000 g | INTRAVENOUS | Status: DC
  Administered 2024-03-08: 2 g via INTRAVENOUS
  Filled 2024-03-08: qty 20

## 2024-03-08 MED ORDER — THIAMINE HCL 100 MG/ML IJ SOLN
100.0000 mg | Freq: Every day | INTRAMUSCULAR | Status: DC
  Administered 2024-03-08: 100 mg via INTRAVENOUS
  Filled 2024-03-08: qty 2

## 2024-03-08 MED ORDER — ADULT MULTIVITAMIN W/MINERALS CH: 1.0000 | ORAL_TABLET | Freq: Every day | ORAL | Status: DC

## 2024-03-08 MED ORDER — VANCOMYCIN HCL 2000 MG/400ML IV SOLN
2000.0000 mg | Freq: Once | INTRAVENOUS | Status: DC
  Administered 2024-03-08: 2000 mg via INTRAVENOUS
  Filled 2024-03-08: qty 400

## 2024-03-08 MED ORDER — FUROSEMIDE 8 MG/ML PO SOLN: 40.0000 mg | Freq: Once | ORAL | Status: DC

## 2024-03-08 MED ORDER — FAMOTIDINE 20 MG PO TABS
20.0000 mg | ORAL_TABLET | Freq: Two times a day (BID) | ORAL | Status: DC
  Administered 2024-03-08: 20 mg
  Filled 2024-03-08: qty 1

## 2024-03-08 MED ORDER — ETOMIDATE 2 MG/ML IV SOLN
INTRAVENOUS | Status: AC
  Filled 2024-03-08: qty 20

## 2024-03-08 MED ORDER — ALBUTEROL SULFATE (2.5 MG/3ML) 0.083% IN NEBU
INHALATION_SOLUTION | RESPIRATORY_TRACT | Status: AC
  Administered 2024-03-08: 2.5 mg
  Filled 2024-03-08: qty 3

## 2024-03-08 MED ORDER — METHYLPREDNISOLONE SODIUM SUCC 125 MG IJ SOLR
125.0000 mg | Freq: Once | INTRAMUSCULAR | Status: AC
  Administered 2024-03-08: 125 mg via INTRAVENOUS
  Filled 2024-03-08: qty 2

## 2024-03-08 MED ORDER — FENTANYL BOLUS VIA INFUSION: 25.0000 ug | INTRAVENOUS | Status: DC | PRN

## 2024-03-08 MED ORDER — NOREPINEPHRINE 4 MG/250ML-% IV SOLN
0.0000 ug/min | INTRAVENOUS | Status: DC
Start: 2024-03-08 — End: 2024-03-09
  Administered 2024-03-08: 2 ug/min via INTRAVENOUS
  Filled 2024-03-08 (×2): qty 250

## 2024-03-08 MED ORDER — FENTANYL 2500MCG IN NS 250ML (10MCG/ML) PREMIX INFUSION: 0.0000 ug/h | INTRAVENOUS | Status: DC

## 2024-03-08 MED ORDER — METHYLPREDNISOLONE SODIUM SUCC 40 MG IJ SOLR: 40.0000 mg | Freq: Two times a day (BID) | INTRAMUSCULAR | Status: DC

## 2024-03-08 MED ORDER — IPRATROPIUM-ALBUTEROL 0.5-2.5 (3) MG/3ML IN SOLN
3.0000 mL | RESPIRATORY_TRACT | Status: DC
  Administered 2024-03-08 – 2024-03-10 (×15): 3 mL via RESPIRATORY_TRACT
  Filled 2024-03-08 (×15): qty 3

## 2024-03-08 MED ORDER — FOLIC ACID 5 MG/ML IJ SOLN
1.0000 mg | Freq: Every day | INTRAMUSCULAR | Status: DC
  Filled 2024-03-08 (×2): qty 0.2

## 2024-03-08 MED ORDER — FENTANYL CITRATE (PF) 50 MCG/ML IJ SOSY: 25.0000 ug | PREFILLED_SYRINGE | Freq: Once | INTRAMUSCULAR | Status: DC

## 2024-03-08 MED ORDER — METHYLPREDNISOLONE SODIUM SUCC 125 MG IJ SOLR
60.0000 mg | Freq: Two times a day (BID) | INTRAMUSCULAR | Status: DC
Start: 2024-03-08 — End: 2024-03-08

## 2024-03-08 MED ORDER — SODIUM CHLORIDE 0.9 % IV SOLN
500.0000 mg | INTRAVENOUS | Status: DC
  Administered 2024-03-08: 500 mg via INTRAVENOUS
  Filled 2024-03-08 (×2): qty 5

## 2024-03-08 MED ORDER — SUCCINYLCHOLINE CHLORIDE 200 MG/10ML IV SOSY
PREFILLED_SYRINGE | INTRAVENOUS | Status: AC
Start: 2024-03-08 — End: 2024-03-08
  Filled 2024-03-08: qty 10

## 2024-03-08 MED ORDER — NICOTINE 21 MG/24HR TD PT24
21.0000 mg | MEDICATED_PATCH | Freq: Every day | TRANSDERMAL | Status: DC
  Administered 2024-03-08 – 2024-03-14 (×7): 21 mg via TRANSDERMAL
  Filled 2024-03-08 (×8): qty 1

## 2024-03-08 MED ORDER — DEXMEDETOMIDINE HCL IN NACL 400 MCG/100ML IV SOLN
INTRAVENOUS | Status: AC
  Filled 2024-03-08: qty 100

## 2024-03-08 MED ORDER — DOCUSATE SODIUM 50 MG/5ML PO LIQD
100.0000 mg | Freq: Two times a day (BID) | ORAL | Status: DC
  Administered 2024-03-08: 100 mg
  Filled 2024-03-08: qty 10

## 2024-03-08 MED ORDER — SODIUM CHLORIDE 0.9 % IV SOLN
2.0000 g | Freq: Three times a day (TID) | INTRAVENOUS | Status: DC
  Administered 2024-03-08: 2 g via INTRAVENOUS
  Filled 2024-03-08: qty 12.5

## 2024-03-08 MED ORDER — HYDRALAZINE HCL 20 MG/ML IJ SOLN
5.0000 mg | INTRAMUSCULAR | Status: DC | PRN
  Administered 2024-03-09: 20 mg via INTRAVENOUS
  Filled 2024-03-08: qty 1

## 2024-03-08 NOTE — Progress Notes (Addendum)
 Pharmacy Antibiotic Note  Francisco [name] is a 63 y.o. male admitted on 03/07/2024 with pneumonia.  Pharmacy has been consulted for vancomycin  and cefepime  dosing.  Plan: Vancomycin  1750 mg every 24 hour. Vancomycin  1750 mg IV Q 24 hrs. Goal AUC 400-500 Expected AUC 466.3 SCR used 1.34  Weight: 93.4 kg (206 lb)  Temp (24hrs), Avg:97.6 F (36.4 C), Min:97.5 F (36.4 C), Max:97.7 F (36.5 C)  Recent Labs  Lab 03/03/24 2136 03/04/24 0146 03/05/24 0636 03/07/24 0946 03/07/24 0958 03/07/24 1229 03/08/24 0435 03/08/24 0706  WBC 13.5* 13.0* 17.6* 22.7*  --   --  16.7*  --   CREATININE 1.21 1.13 1.15 1.50* 1.40*  --  1.34*  --   LATICACIDVEN  --   --   --   --  1.8 1.6 0.8 0.9    Estimated Creatinine Clearance: 67 mL/min (A) (by C-G formula based on SCr of 1.34 mg/dL (H)).    Allergies  Allergen Reactions   Ivp Dye [Iodinated Contrast Media] Other (See Comments)    Seizures    Ultram [Tramadol] Other (See Comments)    Seizures    Vibramycin  [Doxycycline ] Other (See Comments)    Burning sensation to his hands.    Antimicrobials this admission: Azithromycin  500 mg IV Q 24 HR 11/20 >>  Cefepime  2g IV Q 8 HR 11/20 >>  Ceftriaxone  2g IV Q 24h 11/19>>  Microbiology results: 11/20 BCx: times 2, in process  Viral Panel, Positive for Influenza A Patient commenced on Tamiflu >> for 5 days, end date 11/24   Thank you for allowing pharmacy to be a part of this patient's care.  Shirlee GORMAN Langdon, Pharmacy Student 03/08/2024 12:34 PM  I have reviewed the student's dosing calculations and note and agree with plan as documented and ordered.  Lynwood Lites, PharmD, CPP Clinical Pharmacist Practitioner

## 2024-03-08 NOTE — Progress Notes (Signed)
 Attempted STAT echocardiogram at 8:58am. MD stated that stat echo was not needed at this time due to pt's declining saturation levels and was too unstable, for accurate imaging.

## 2024-03-08 NOTE — Progress Notes (Signed)
 VASCULAR LAB    Bilateral lower extremity venous duplex has been performed.  See CV proc for preliminary results.   Yareliz Thorstenson, RVT 03/08/2024, 3:45 PM

## 2024-03-08 NOTE — Progress Notes (Signed)
 Pt transported from 6E 22 to 17M 7 on Bipap with RRT and RN. 20/10 needing 100% FIO2 to keep SATs >93%.

## 2024-03-08 NOTE — Progress Notes (Addendum)
 eLink Physician-Brief Progress Note Patient Name: Braylin Xu DOB: 12-Aug-1960 MRN: [id number]   Date of Service  03/08/2024  HPI/Events of Note  63 year old male that presented with a COPD exacerbation requiring continuous BiPAP therapy.  Attempted Precedex  earlier in the day but he was hypersomnolent with hemodynamic disturbance. Requesting something as needed for anxiety  eICU Interventions  Recommend nonpharmacological therapy including putting the patient back on BiPAP Maintain n.p.o. sips with meds for now   0340 -continues to have back pain, no as needed meds-add acetaminophen .  Also believes that he is having a hard time breathing, does not want to continue BiPAP therapy.  Add olanzapine   Intervention Category Minor Interventions: Agitation / anxiety - evaluation and management  Marylynn Rigdon 03/08/2024, 9:13 PM

## 2024-03-08 NOTE — Progress Notes (Signed)
 VASCULAR LAB    ABI has been performed.  See CV proc for preliminary results.   Asheton Viramontes, RVT 03/08/2024, 1:56 PM

## 2024-03-08 NOTE — Progress Notes (Signed)
 Mr. Deneault is a 63 year old gentleman with a past medical history significant for COPD with recurrent exacerbations and previous hospitalizations for acute on chronic hypoxic respiratory failure, HFpEF, tobacco use, paroxysmal atrial fibrillation on Eliquis , CAD status post PCI, and hypertension. He presented with recurrent shortness of breath and respiratory distress yesterday.  The patient was discharged three days ago with a new pulmonary regimen (DuoNeb, Pulmicort , Brovana ) and a prednisone  taper. He returned to the ED yesterday at 19:25 with another episode of respiratory distress and received Solu-Medrol , IV magnesium , and azithromycin . During his prior hospitalization, PCCM recommended initiation of home BiPAP; although it was ordered, the patient has not yet received the device as shipping can take up to 14 days.  Last night, the patient developed respiratory distress accompanied by anxiety. The night team evaluated him, noted significant anxiety, and administered Atarax  50 mg and one dose of Ativan . At that time, mottling of the lower extremities was observed, and ABI and a DVT ultrasound were ordered for this morning.  This morning, I was paged for worsening anxiety and shortness of breath. Upon evaluation, the patient was seated in a tripod position, visibly anxious, shaking, tachypneic, and tachycardic.  Physical Examination: General: Anxious, in severe respiratory distress with tachypnea. Lungs: Mild scattered wheezes bilaterally. Lower Extremities: Both legs cold up to the knees with purple mottling over the knees. Dorsalis pedis pulses not palpable bilaterally. On Doppler, the right dorsalis pedis was detectable; the left dorsalis pedis remained undetectable. Cardiac: Tachycardic, no friction rub appreciated.  Interventions: - Initiated oxygen  at 6 L and BiPAP. - nebulized treatments (DuoNeb, Pulmicort , Brovana ). - ABG: CO2 105, pH 7.20. - Lasix  40 mg IV administered. - Solu-Medrol   125 mg IV given. - Started vancomycin  and cefepime . - BiPAP was switched to nasal cannula and face mask due to nausea. - Bedside echo considered. - PCCM notified. - Rapid response activated. - Patient transferred to ICU for higher level of care.

## 2024-03-08 NOTE — Progress Notes (Signed)
 Date and time results received: 03/08/24 0620   Test: PCO2  Critical Value: 87  Name of Provider Notified: Kristy Panda  Orders Received? Or Actions Taken?: Actions Taken: Called RT to adjust BiPAP setting

## 2024-03-08 NOTE — Consult Note (Addendum)
 Hospital Consult    Reason for Consult: Critical limb ischemia  Requesting Physician:  Norleen Emilio RIGGERS MRN #:  [id number]  History of Present Illness: This is a 63 y.o. male with past medical history significant for CHF, COPD, Chronic hypoxic respiratory failure on 2L home oxygen , Hepatitis C, HTN, polysubstance abuse, CAD, Atrial fibrillation on Eliquis  who presented to ED with shortness of breath. History is mostly obtained from chart and RN/MD at bedside due to patients recent change in mental status after Precedex  was administered. He was recently just admitted for prior exacerbation a week ago.BLE mottling was just noticed earlier today. Apparently was not previously appreciated. Staff unable to find doppler signals. ABI was obtained. Showing stable ABI of 0.96 on the right with absent toe pressure unchanged from ABI in 2023.  Left ABI absent, previously 0.78. Per MD he is ambulatory but this has progressively become more challenging. Had not reported any leg pain on this admission or recent admissions. He supposedly seems to be compliant with his medications. He has not been on pressors until the past hour. Vascular surgery was consulted for evaluation.   Past Medical History:  Diagnosis Date   Atrial flutter with rapid ventricular response (HCC) 08/14/2023   CHF (congestive heart failure) (HCC)    COPD (chronic obstructive pulmonary disease) (HCC)    COPD with acute exacerbation (HCC) 10/21/2021   Coronary artery disease    a. s/p prior PCI.   Hepatitis-C    History of CHF (congestive heart failure) 01/28/2024   History of stroke 02/13/2023   Hypertension    IV drug abuse (HCC)    a. 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   Medically noncompliant    MI (myocardial infarction) (HCC)    TIA (transient ischemic attack) 02/14/2023   Tobacco abuse     Past Surgical History:  Procedure Laterality Date   BACK SURGERY     NECK SURGERY     RIGHT/LEFT HEART CATH AND  CORONARY ANGIOGRAPHY N/A 04/30/2019   Procedure: RIGHT/LEFT HEART CATH AND CORONARY ANGIOGRAPHY;  Surgeon: Anner Alm ORN, MD;  Location: [facility] INVASIVE CV LAB;  Service: Cardiovascular;  Laterality: N/A;    Allergies  Allergen Reactions   Ivp Dye [Iodinated Contrast Media] Other (See Comments)    Seizures    Ultram [Tramadol] Other (See Comments)    Seizures    Vibramycin  [Doxycycline ] Other (See Comments)    Burning sensation to his hands.    Prior to Admission medications   Medication Sig Start Date End Date Taking? Authorizing Provider  Dupilumab  (DUPIXENT ) 300 MG/2ML SOAJ Inject 300 mg into the skin every 14 (fourteen) days. Patient taking differently: Inject 300 mg into the skin See admin instructions. Inject 2mL (300mg ) into the skin every other Monday. 01/26/24  Yes Hunsucker, [name], MD  albuterol  (PROVENTIL ) (2.5 MG/3ML) 0.083% nebulizer solution Take 2.5 mg by nebulization every 6 (six) hours as needed for wheezing or shortness of breath.    [provider]  albuterol  (VENTOLIN  HFA) 108 (90 Base) MCG/ACT inhaler Inhale 1-2 puffs into the lungs every 6 (six) hours as needed for wheezing or shortness of breath. 03/06/24   [name], [name], DO  Albuterol -Budesonide  90-80 MCG/ACT AERO Inhale 2 puffs into the lungs every 4 (four) hours as needed (wheeze, shortness of breath). 01/23/24   Hunsucker, [name], MD  apixaban  (ELIQUIS ) 5 MG TABS tablet Take 1 tablet (5 mg total) by mouth 2 (two) times daily. 02/01/24   Marylu Gee, DO  atorvastatin  (LIPITOR )  40 MG tablet Take 1 tablet (40 mg total) by mouth daily. 02/01/24   Marylu Gee, DO  benzonatate  (TESSALON ) 200 MG capsule Take 1 capsule (200 mg total) by mouth 3 (three) times daily as needed for cough. 02/01/24   Amilibia, Jaden, DO  buPROPion  (WELLBUTRIN  SR) 150 MG 12 hr tablet Take 1 tablet (150 mg total) by mouth 2 (two) times daily. Take 1 tablet once daily for the first 3 days (on 10/15-10/17), then take one tablet  twice daily (10/18 and onward) 02/01/24   Amilibia, Jaden, DO  diltiazem  (CARDIZEM  CD) 120 MG 24 hr capsule Take 1 capsule (120 mg total) by mouth daily. 02/01/24   Marylu Gee, DO  fluticasone  (FLONASE ) 50 MCG/ACT nasal spray Place 1 spray into both nostrils daily. Patient taking differently: Place 1 spray into both nostrils daily as needed for rhinitis or allergies. 01/25/24   [name], [name], DO  Fluticasone -Umeclidin-Vilant (TRELEGY ELLIPTA ) 200-62.5-25 MCG/ACT AEPB Inhale 1 puff into the lungs daily. 03/05/24 05/04/24  Azadegan, Maryam, MD  guaiFENesin -dextromethorphan  (ROBITUSSIN DM) 100-10 MG/5ML syrup Take 15 mLs by mouth 4 (four) times daily. Patient not taking: Reported on 03/02/2024 02/01/24   Amilibia, Jaden, DO  hydrOXYzine  (ATARAX ) 25 MG tablet Take 1 tablet (25 mg total) by mouth 3 (three) times daily as needed for anxiety (Can take extra dose PRN not exceeding 50 mg TID). 03/05/24   Azadegan, Maryam, MD  ipratropium-albuterol  (DUONEB) 0.5-2.5 (3) MG/3ML SOLN Take 3 mLs by nebulization every 4 (four) hours as needed. 02/28/24   Renne Homans, MD  loratadine  (CLARITIN ) 10 MG tablet Take 1 tablet (10 mg total) by mouth daily. 11/28/23 11/27/24  [name], [name], DO  losartan  (COZAAR ) 50 MG tablet Take 1 tablet (50 mg total) by mouth daily. 02/01/24   Marylu Gee, DO  metoprolol  tartrate (LOPRESSOR ) 25 MG tablet Take 1 tablet (25 mg total) by mouth 2 (two) times daily. 02/01/24   Marylu Gee, DO  nicotine  (NICODERM CQ  - DOSED IN MG/24 HOURS) 21 mg/24hr patch Place 1 patch (21 mg total) onto the skin daily. 02/28/24 02/27/25  Amoako, Prince, MD  nitroGLYCERIN  (NITROSTAT ) 0.4 MG SL tablet Place 1 tablet (0.4 mg total) under the tongue every 5 (five) minutes x 3 doses as needed for chest pain. 11/17/23   [name], [name], DO  OXYGEN  Inhale 2-4 L/min into the lungs See admin instructions. Inhale 2 L/min continuously (baseline). May increase up to 4 L/min if needed for poor oxygen  saturation  or exertion.    [provider]  predniSONE  (DELTASONE ) 10 MG tablet Take 4 tablets (40 mg total) by mouth daily with breakfast for 4 days, THEN 3 tablets (30 mg total) daily with breakfast for 5 days, THEN 2 tablets (20 mg total) daily with breakfast for 5 days, THEN 1 tablet (10 mg total) daily with breakfast for 5 days. 03/06/24 03/25/24  Azadegan, Maryam, MD  thiamine  (VITAMIN B-1) 100 MG tablet Take 1 tablet (100 mg total) by mouth daily. 08/15/23   Verdene Purchase, MD    Social History   Socioeconomic History   Marital status: Divorced    Spouse name: Not on file   Number of children: Not on file   Years of education: Not on file   Highest education level: Not on file  Occupational History   Not on file  Tobacco Use   Smoking status: Every Day    Current packs/day: 0.50    Types: Cigarettes    Passive exposure: Current   Smokeless tobacco: Never  Tobacco comments:    Half a pack a day as of 12/07/2023  Vaping Use   Vaping status: Never Used  Substance and Sexual Activity   Alcohol use: Never   Drug use: Yes    Frequency: 2.0 times per week    Types: Methamphetamines    Comment: active, has been to rehab. has been to [facility] (did not work). Prev heroin.   Sexual activity: Not Currently  Other Topics Concern   Not on file  Social History Narrative   Not on file   Social Drivers of Health   Financial Resource Strain: High Risk (08/24/2023)   Received from [facility]   Overall Financial Resource Strain (CARDIA)    Difficulty of Paying Living Expenses: Hard  Food Insecurity: No Food Insecurity (03/04/2024)   Hunger Vital Sign    Worried About Running Out of Food in the Last Year: Never true    Ran Out of Food in the Last Year: Never true  Transportation Needs: No Transportation Needs (03/04/2024)   PRAPARE - Administrator, Civil Service (Medical): No    Lack of Transportation (Non-Medical): No  Physical Activity: Not on file   Stress: Not on file  Social Connections: Unknown (08/14/2023)   Social Connection and Isolation Panel    Frequency of Communication with Friends and Family: Twice a week    Frequency of Social Gatherings with Friends and Family: Not on file    Attends Religious Services: 1 to 4 times per year    Active Member of [employer] or Organizations: No    Attends Banker Meetings: Never    Marital Status: Separated  Intimate Partner Violence: Not At Risk (03/04/2024)   Humiliation, Afraid, Rape, and Kick questionnaire    Fear of Current or Ex-Partner: No    Emotionally Abused: No    Physically Abused: No    Sexually Abused: No     Family History  Problem Relation Age of Onset   Rheum arthritis Mother    CVA Father    Heart disease Paternal Grandfather     ROS: Otherwise negative unless mentioned in HPI  Physical Examination  Vitals:   03/08/24 1500 03/08/24 1502  BP: (!) 89/62   Pulse: 62 63  Resp: (!) 24 (!) 27  Temp:    SpO2: 97%    Body mass index is 27.94 kg/m.  General:  critically ill appearing, on BiPAP, very somnolent Gait: Not observed HENT: WNL, normocephalic Pulmonary: on BiPAP Cardiac: regular Abdomen: soft Vascular Exam/Pulses: unable to palpate right femoral pulse, Left femoral pulse 2 +, no distal pulses palpable.  Right DP doppler signal, monophasic very faint popliteal on left, no distal doppler signals. Both feet cool and mottled, left leg mottled to knee level and cool. Spontaneously moves his legs, not able to follow commands to move toes or evaluate sensation; BLE with some chronic venous stasis changes Musculoskeletal: no muscle wasting or atrophy  Neurologic: A&O X 3;  No focal weakness or paresthesias are detected; speech is fluent/normal Psychiatric:  The pt has Normal affect.   CBC    Component Value Date/Time   WBC 16.7 (H) 03/08/2024 0435   RBC 4.55 03/08/2024 0435   HGB 14.3 03/08/2024 1312   HGB 15.5 12/06/2023 1528   HCT 42.0  03/08/2024 1312   HCT 47.1 12/06/2023 1528   PLT 207 03/08/2024 0435   PLT 285 12/06/2023 1528   MCV 100.0 03/08/2024 0435   MCV 97 12/06/2023  1528   MCH 31.6 03/08/2024 0435   MCHC 31.6 03/08/2024 0435   RDW 14.0 03/08/2024 0435   RDW 13.6 12/06/2023 1528   LYMPHSABS 2.0 03/07/2024 0946   LYMPHSABS 1.7 05/18/2021 1518   MONOABS 0.9 03/07/2024 0946   EOSABS 0.0 03/07/2024 0946   EOSABS 0.0 05/18/2021 1518   BASOSABS 0.0 03/07/2024 0946   BASOSABS 0.0 05/18/2021 1518    BMET    Component Value Date/Time   NA 138 03/08/2024 1312   NA 144 12/06/2023 1528   K 5.7 (H) 03/08/2024 1312   CL 96 (L) 03/08/2024 0435   CO2 35 (H) 03/08/2024 0435   GLUCOSE 111 (H) 03/08/2024 0435   BUN 27 (H) 03/08/2024 0435   BUN 9 12/06/2023 1528   CREATININE 1.34 (H) 03/08/2024 0435   CALCIUM  8.8 (L) 03/08/2024 0435   GFRNONAA 60 (L) 03/08/2024 0435   GFRAA >60 01/11/2020 0422    COAGS: Lab Results  Component Value Date   INR 1.2 03/07/2024   INR 1.1 12/06/2023   INR 1.0 02/13/2023     Non-Invasive Vascular Imaging:   +-------+-----------+-----------+------------+------------+  ABI/TBIToday's ABIToday's TBIPrevious ABIPrevious TBI  +-------+-----------+-----------+------------+------------+  Right 0.96       absent     0.99        absent        +-------+-----------+-----------+------------+------------+  Left  absent     absent     0.78        absent        +-------+-----------+-----------+------------+------------+   Right ABIs and bilateral TBIs appear essentially unchanged compared to  prior study on 04/20/21. Left ABIs appear decreased compared to prior study  on 04/20/21.   Statin:  Yes.   Beta Blocker:  Yes.   Aspirin :  No. ACEI:  No. ARB:  No. CCB use:  No Other antiplatelets/anticoagulants:  Yes.   Eliquis    ASSESSMENT/PLAN: This is a 63 y.o. male with multiple comorbidities admitted with COPD exacerbation. Noted yesterday to have mottling of BLE. ABI  stable on the right, absent on the left. Patient just became altered after given Precedex  and has become hypotensive in past hour requiring pressors. Previously not on pressors. Suspect from prior ABI that his current symptoms are likely acute on chronic disease. The RLE has some mild mottling and is cool but has DP doppler signal. The LLE is likely acute on chronic changes. Hard to know if he possibly embolized something to his LLE. He has a contrast allergy with reported seizures so I have ordered a duplex of the LLE for further evaluation. He currently is not medically stable for any surgical intervention with hypotension and respiratory distress on BiPAP and unable to fully assess if he has motor and sensation intact. Would benefit from transitioning from Eliquis  to initiation of IV heparin  to see if any improvement. The on call vascular surgeon, Dr. Magda will see patient later this evening to follow up and provide further recommendations.    Teretha Damme PA-C [facility] [phone] 03/08/2024  3:22 PM  VASCULAR STAFF ADDENDUM: I have independently interviewed and examined the patient. I agree with the above.  More awake on my evaluation after PA Baglia Patient is in COPD exacerbation currently in hypoxemic respiratory failure on BiPAP.  He has low-dose Levophed  infusing. No Doppler signals in bilateral lower extremity on my bedside Doppler exam, but formal ABI shows some Doppler flow in the right lower extremity.  No Doppler flow was noted in the left lower  extremity. Thankfully, the patient is asymptomatic.  He reports no pain in his feet.  He reports no sensory or motor changes.  He has a normal neurologic exam. Not stable for intervention to evaluate asymptomatic severe arterial disease. If some improvement in the morning, will plan for angiogram.  Keep n.p.o. after midnight.  Debby SAILOR. Magda, MD [facility] Phone  Number: [phone] 03/08/2024 5:38 PM

## 2024-03-08 NOTE — Consult Note (Addendum)
 NAME:  Francisco [name], MRN:  [id number], DOB:  17-Nov-1960, LOS: 1 ADMISSION DATE:  03/07/2024, CONSULTATION DATE:  11/20 REFERRING MD:  Dr. Lovie, CHIEF COMPLAINT:  copd exacerbation   History of Present Illness:  Patient is a 63 yo M w/ pertinent PMH COPD on 2L [place] at home, tobacco use, HFimpEF, CAD s/p stent placement, Afib on eliquis  presents to [facility] on 11/19 w/ copd exacerbation.  Patient frequently admitted for copd exacerbation. Most recently admitted on 11/16 and discharged on steroid taper on 11/17. Worsening sob since he left. Came back to [facility] on 11/19. Given nebs, mag, and steroids. CXR unremarkable. Flu positive. Given rocephin /azithro and started on tamiflu. Admitted by IMTS to floors.  On 11/20 worsening respiratory distress and hypoxia. Tripoding. ABG 7.22, 105, 295, 42 on NRB. Placed on bipap. Patient very anxious given low dose ativan . Given dose of lasix . PCCM consulted.  Pertinent  Medical History   Past Medical History:  Diagnosis Date   Atrial flutter with rapid ventricular response (HCC) 08/14/2023   CHF (congestive heart failure) (HCC)    COPD (chronic obstructive pulmonary disease) (HCC)    COPD with acute exacerbation (HCC) 10/21/2021   Coronary artery disease    a. s/p prior PCI.   Hepatitis-C    History of CHF (congestive heart failure) 01/28/2024   History of stroke 02/13/2023   Hypertension    IV drug abuse (HCC)    a. 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   Medically noncompliant    MI (myocardial infarction) (HCC)    TIA (transient ischemic attack) 02/14/2023   Tobacco abuse      Significant Hospital Events: Including procedures, antibiotic start and stop dates in addition to other pertinent events   11/19 admit copd exacerbation 11/20 hypercapnia resp failure placed on bipap; transferred to icu  Interim History / Subjective:  See above  Objective    Blood pressure (!) 161/99, pulse 90, temperature 97.7 F (36.5 C), temperature  source Axillary, resp. rate 18, weight 93.4 kg, SpO2 96%.    Vent Mode: BIPAP;PCV FiO2 (%):  [40 %-100 %] 40 % Set Rate:  [8 bmp] 8 bmp PEEP:  [5 cmH20-10 cmH20] 5 cmH20 Pressure Support:  [8 cmH20-12 cmH20] 10 cmH20   Intake/Output Summary (Last 24 hours) at 03/08/2024 0847 Last data filed at 03/07/2024 1724 Gross per 24 hour  Intake --  Output 300 ml  Net -300 ml   Filed Weights   03/07/24 2024  Weight: 93.4 kg    Examination: General:  critically ill appearing w/ respiratory distress HEENT: MM pink/moist; bipap in place Neuro: patient confused but able to state name and follow some commands CV: s1s2, RRR, no m/r/g PULM:  dim wheezing BS bilaterally; bipap 100% sats 97%; tripod breathing GI: soft, bsx4 active  Extremities: cool/dry, no edema; diffuse mottling ble   Resolved problem list   Assessment and Plan   Acute on chronic respiratory failure w/ hypoxia/hypercapnia: on 2L [place] at home COPD exacerbation Severe pulmonary HTN Influenza A positive Plan: -cont bipap; wean fio2 for sats >90% -if having further anxiety can consider precedex  if needed -check abg in 1 hour; if no improvement may need to consider intubation -steroids -duoneb q4; pulmicort  bid; when stable can change to triple therapy maintenance inhaler -broaden abx for hcap coverage to cefepime /vanc; azithro for atpyical; cont tamiflu -check mrsa pcr, check urine strep/legionella -given lasix ; monitor uop -pulm toiletry  PAF Plan: -currently on eliquis ; if unable to take po consider switching to heparin   iv -holding home dilt and metoprolol  for now; resume when appropriate  Hx of HFimpEF: EF 25-30% 2021 now 60-65% 07/2023 Hx of htn/hld Plan: -repeat echo -given lasix ; monitor uop -hold gdmt while npo on bipap -prn iv hydralazine  for htn -statin  Ble mottling Plan: -ABI and dvt us  pending -given hx of HCV consider hcv rna quant and cryoglobulin studies -cont AC -monitor patient's  pulses  Hx of CAD s/p pci Plan: -statin -no antiplatelet on home med list?  Tobacco abuse Plan: -nicotine  patch  Substance abuse: methamphetamine use Plan: -cessation counseling when appropriate  Hx of anxiety/depression Plan: -hold hydroxyzine  and wellbutrin  for now  Labs   CBC: Recent Labs  Lab 03/01/24 2256 03/02/24 0453 03/03/24 2136 03/03/24 2138 03/04/24 0146 03/05/24 0636 03/07/24 0946 03/07/24 0958 03/07/24 0959 03/07/24 1227 03/08/24 0435  WBC 10.8*   < > 13.5*  --  13.0* 17.6* 22.7*  --   --   --  16.7*  NEUTROABS 9.8*  --  11.8*  --   --   --  18.9*  --   --   --   --   HGB 13.9   < > 13.9   < > 14.2 13.8 16.1 17.3* 17.7* 15.3 14.4  HCT 42.9   < > 43.6   < > 44.9 43.8 50.2 51.0 52.0 45.0 45.5  MCV 96.8   < > 98.9  --  100.0 100.2* 99.4  --   --   --  100.0  PLT 222   < > 221  --  151 222 292  --   --   --  207   < > = values in this interval not displayed.    Basic Metabolic Panel: Recent Labs  Lab 03/03/24 2136 03/03/24 2138 03/04/24 0146 03/05/24 0636 03/07/24 0946 03/07/24 0958 03/07/24 0959 03/07/24 1227 03/08/24 0435  NA 140   < > 139 141 142 140 139 138 143  K 4.8   < > 5.0 4.8 5.3* 5.2* 5.1 5.4* 5.5*  CL 103  --  102 103 94* 94*  --   --  96*  CO2 29  --  27 29 33*  --   --   --  35*  GLUCOSE 137*  --  134* 98 258* 252*  --   --  111*  BUN 22  --  19 24* 18 26*  --   --  27*  CREATININE 1.21  --  1.13 1.15 1.50* 1.40*  --   --  1.34*  CALCIUM  8.6*  --  8.4* 8.7* 9.5  --   --   --  8.8*   < > = values in this interval not displayed.   GFR: Estimated Creatinine Clearance: 67 mL/min (A) (by C-G formula based on SCr of 1.34 mg/dL (H)). Recent Labs  Lab 03/04/24 0146 03/05/24 0636 03/07/24 0946 03/07/24 0958 03/07/24 1229 03/08/24 0435 03/08/24 0706  WBC 13.0* 17.6* 22.7*  --   --  16.7*  --   LATICACIDVEN  --   --   --  1.8 1.6 0.8 0.9    Liver Function Tests: Recent Labs  Lab 03/07/24 0946  AST 28  ALT 53*  ALKPHOS  90  BILITOT 0.9  PROT 6.8  ALBUMIN  4.0   No results for input(s): LIPASE, AMYLASE in the last 168 hours. No results for input(s): AMMONIA in the last 168 hours.  ABG    Component Value Date/Time   PHART 7.333 (L) 03/01/2024 9342  PCO2ART 59.2 (H) 03/01/2024 0657   PO2ART 158 (H) 03/01/2024 0657   HCO3 41.8 (H) 03/08/2024 0431   TCO2 43 (H) 03/07/2024 1227   O2SAT 100 03/08/2024 0431     Coagulation Profile: Recent Labs  Lab 03/07/24 0946  INR 1.2    Cardiac Enzymes: Recent Labs  Lab 03/08/24 0435  CKTOTAL 92    HbA1C: Hgb A1c MFr Bld  Date/Time Value Ref Range Status  08/13/2023 05:47 AM 5.8 (H) 4.8 - 5.6 % Final    Comment:    (NOTE) Pre diabetes:          5.7%-6.4%  Diabetes:              >6.4%  Glycemic control for   <7.0% adults with diabetes   02/13/2023 08:20 PM 6.2 (H) 4.8 - 5.6 % Final    Comment:    (NOTE) Pre diabetes:          5.7%-6.4%  Diabetes:              >6.4%  Glycemic control for   <7.0% adults with diabetes     CBG: Recent Labs  Lab 03/08/24 0842  GLUCAP 127*    Review of Systems:   Patient is on bipap sob; therefore, history has been obtained from chart review.    Past Medical History:  He,  has a past medical history of Atrial flutter with rapid ventricular response (HCC) (08/14/2023), CHF (congestive heart failure) (HCC), COPD (chronic obstructive pulmonary disease) (HCC), COPD with acute exacerbation (HCC) (10/21/2021), Coronary artery disease, Hepatitis-C, History of CHF (congestive heart failure) (01/28/2024), History of stroke (02/13/2023), Hypertension, IV drug abuse (HCC), Medically noncompliant, MI (myocardial infarction) (HCC), TIA (transient ischemic attack) (02/14/2023), and Tobacco abuse.   Surgical History:   Past Surgical History:  Procedure Laterality Date   BACK SURGERY     NECK SURGERY     RIGHT/LEFT HEART CATH AND CORONARY ANGIOGRAPHY N/A 04/30/2019   Procedure: RIGHT/LEFT HEART CATH AND  CORONARY ANGIOGRAPHY;  Surgeon: Anner Alm ORN, MD;  Location: [facility] INVASIVE CV LAB;  Service: Cardiovascular;  Laterality: N/A;     Social History:   reports that he has been smoking cigarettes. He has been exposed to tobacco smoke. He has never used smokeless tobacco. He reports current drug use. Frequency: 2.00 times per week. Drug: Methamphetamines. He reports that he does not drink alcohol.   Family History:  His family history includes CVA in his father; Heart disease in his paternal grandfather; Rheum arthritis in his mother.   Allergies Allergies  Allergen Reactions   Ivp Dye [Iodinated Contrast Media] Other (See Comments)    Seizures    Ultram [Tramadol] Other (See Comments)    Seizures    Vibramycin  [Doxycycline ] Other (See Comments)    Burning sensation to his hands.     Home Medications  Prior to Admission medications   Medication Sig Start Date End Date Taking? Authorizing Provider  albuterol  (PROVENTIL ) (2.5 MG/3ML) 0.083% nebulizer solution Take 2.5 mg by nebulization every 6 (six) hours as needed for wheezing or shortness of breath.    [provider]  albuterol  (VENTOLIN  HFA) 108 (90 Base) MCG/ACT inhaler Inhale 1-2 puffs into the lungs every 6 (six) hours as needed for wheezing or shortness of breath. 03/06/24   [name], [name], DO  Albuterol -Budesonide  90-80 MCG/ACT AERO Inhale 2 puffs into the lungs every 4 (four) hours as needed (wheeze, shortness of breath). 01/23/24   Hunsucker, [name], MD  apixaban  (ELIQUIS )  5 MG TABS tablet Take 1 tablet (5 mg total) by mouth 2 (two) times daily. 02/01/24   Marylu Gee, DO  atorvastatin  (LIPITOR ) 40 MG tablet Take 1 tablet (40 mg total) by mouth daily. 02/01/24   Marylu Gee, DO  benzonatate  (TESSALON ) 200 MG capsule Take 1 capsule (200 mg total) by mouth 3 (three) times daily as needed for cough. 02/01/24   Amilibia, Jaden, DO  buPROPion  (WELLBUTRIN  SR) 150 MG 12 hr tablet Take 1 tablet (150 mg total) by mouth  2 (two) times daily. Take 1 tablet once daily for the first 3 days (on 10/15-10/17), then take one tablet twice daily (10/18 and onward) 02/01/24   Amilibia, Jaden, DO  diltiazem  (CARDIZEM  CD) 120 MG 24 hr capsule Take 1 capsule (120 mg total) by mouth daily. 02/01/24   Marylu Gee, DO  Dupilumab  (DUPIXENT ) 300 MG/2ML SOAJ Inject 300 mg into the skin every 14 (fourteen) days. Patient taking differently: Inject 300 mg into the skin See admin instructions. Inject 2mL (300mg ) into the skin every other Monday. 01/26/24   Hunsucker, [name], MD  fluticasone  (FLONASE ) 50 MCG/ACT nasal spray Place 1 spray into both nostrils daily. Patient taking differently: Place 1 spray into both nostrils daily as needed for rhinitis or allergies. 01/25/24   [name], [name], DO  Fluticasone -Umeclidin-Vilant (TRELEGY ELLIPTA ) 200-62.5-25 MCG/ACT AEPB Inhale 1 puff into the lungs daily. 03/05/24 05/04/24  Azadegan, Maryam, MD  guaiFENesin -dextromethorphan  (ROBITUSSIN DM) 100-10 MG/5ML syrup Take 15 mLs by mouth 4 (four) times daily. Patient not taking: Reported on 03/02/2024 02/01/24   Amilibia, Jaden, DO  hydrOXYzine  (ATARAX ) 25 MG tablet Take 1 tablet (25 mg total) by mouth 3 (three) times daily as needed for anxiety (Can take extra dose PRN not exceeding 50 mg TID). 03/05/24   Azadegan, Maryam, MD  ipratropium-albuterol  (DUONEB) 0.5-2.5 (3) MG/3ML SOLN Take 3 mLs by nebulization every 4 (four) hours as needed. 02/28/24   Renne Homans, MD  loratadine  (CLARITIN ) 10 MG tablet Take 1 tablet (10 mg total) by mouth daily. 11/28/23 11/27/24  [name], [name], DO  losartan  (COZAAR ) 50 MG tablet Take 1 tablet (50 mg total) by mouth daily. 02/01/24   Marylu Gee, DO  metoprolol  tartrate (LOPRESSOR ) 25 MG tablet Take 1 tablet (25 mg total) by mouth 2 (two) times daily. 02/01/24   Marylu Gee, DO  nicotine  (NICODERM CQ  - DOSED IN MG/24 HOURS) 21 mg/24hr patch Place 1 patch (21 mg total) onto the skin daily. 02/28/24 02/27/25   Renne Homans, MD  nitroGLYCERIN  (NITROSTAT ) 0.4 MG SL tablet Place 1 tablet (0.4 mg total) under the tongue every 5 (five) minutes x 3 doses as needed for chest pain. 11/17/23   [name], [name], DO  OXYGEN  Inhale 2-4 L/min into the lungs See admin instructions. Inhale 2 L/min continuously (baseline). May increase up to 4 L/min if needed for poor oxygen  saturation or exertion.    [provider]  predniSONE  (DELTASONE ) 10 MG tablet Take 4 tablets (40 mg total) by mouth daily with breakfast for 4 days, THEN 3 tablets (30 mg total) daily with breakfast for 5 days, THEN 2 tablets (20 mg total) daily with breakfast for 5 days, THEN 1 tablet (10 mg total) daily with breakfast for 5 days. 03/06/24 03/25/24  Azadegan, Maryam, MD  thiamine  (VITAMIN B-1) 100 MG tablet Take 1 tablet (100 mg total) by mouth daily. 08/15/23   Verdene Purchase, MD     Critical care time: 45 minutes    JD Birdell Frasier, PA-C [place]  Pulmonary & Critical Care 03/08/2024, 8:48 AM  Please see [email or web address] for pager details.  From 7A-7P if no response, please call [phone]. After hours, please call ELink [phone].

## 2024-03-08 NOTE — Progress Notes (Addendum)
 Pt on BIPAP at this time.  03/08/24 1955  BiPAP/CPAP/SIPAP  BiPAP/CPAP/SIPAP Pt Type Adult  BiPAP/CPAP/SIPAP SERVO  Mask Type Full face mask  Dentures removed? Not applicable  Mask Size Medium  Set Rate 10 breaths/min  Respiratory Rate 22 breaths/min  IPAP 20 cmH20  EPAP 10 cmH2O  Pressure Support 10 cmH20  PEEP 10 cmH20  FiO2 (%) 40 %  Minute Ventilation 10.9  Leak 54  Peak Inspiratory Pressure (PIP) 19  Tidal Volume (Vt) 537  Patient Home Machine No  Patient Home Mask No  Patient Home Tubing No  Auto Titrate No  Press High Alarm 35 cmH2O  Press Low Alarm 2 cmH2O  Nasal massage performed Yes  CPAP/SIPAP surface wiped down Yes  Device Plugged into RED Power Outlet Yes

## 2024-03-08 NOTE — Progress Notes (Signed)
 Patient placed back on BiPAP.  MD at bedside.

## 2024-03-08 NOTE — Progress Notes (Signed)
  Echocardiogram 2D Echocardiogram has been performed.  Francisco [name] 03/08/2024, 12:37 PM

## 2024-03-08 NOTE — Plan of Care (Signed)
  Problem: Coping: Goal: Level of anxiety will decrease Outcome: Not Progressing   

## 2024-03-08 NOTE — Progress Notes (Signed)
 Rec'd pt on bipap this morning, however pt has taken bipap off x 3, refuses to wear bipap then will request it be placed back on now'.  Pt placed back on bipap x 3.  Currently pt removed bipap for the third time, now on NRB sat between 88-92%.  Pt now c/o nausea and states he feels he could vomit.  Pt c/o SOB however d/t nausea unable to place pt on bipap.  RN and MD Dr. Azadegan in room to assess patient.  Currently sat is stable, I have request [facility] if needed, currently per RN and MD no RT needs at this time.  They are evaluating doplar currently.  Charge RT aware.

## 2024-03-08 NOTE — Progress Notes (Signed)
 IMTS Cross Cover Note  Received page from nursing about about severe anxiety. Evaluated patient at bedside. Patient was seen on the edge of the bed with BIPAP on propped with his arms and visibly shaking. Had received 50mg  Atarax  prior to this without significant effect. Kept repeating Help me. Patient requesting Solumedrol and Magnesium  despite being loaded with these two medications yesterday. Last nebulizer treatment was 4 hours prior to feeling anxious. Patient denied chest pain. He was diaphoretic.  Of note, his feet were mottled and purple on the ground at this time. He denied paresthesias and pain in his bilateral lower extremities  On the monitor, HR was in the 110s-120s, SPO2 was low 90%, and BP elevated to 180s/110s.  General: Sitting on edge of the bed in visible distress, shaking uncontrolably with BiPAP and [place] under Bipap. Diaphoretic Cardio: Tachycardic, unable to ascertain other heart sounds due to shaking and breathing apparatus Pulm: Mild wheezing throughout lung fields Extremities. Bilateral feet with mottling and purple tinged toes. Non palpable pulses bilaterally at the DP. Right DP with doppler signal but no doppler signal on left. Palpable popliteal pulses bilaterally. Feet are very cool to touch but shins are warm. 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 Swelling of the ankles, right worse than left. Neuro: Anxious   Picture of bilateral lower extremities, after perfusion had improved.  Media Information  Document Information  Photos  R and L - lower extremities. Warm to lower shins. Doppler site marked with X on R foot  03/08/2024 04:05  Attached To:  Hospital Encounter on 03/07/24  Source Information  Elnora Ip, MD  [facility]    Anxiety Patient was seen at bedside visibly anxious. Unclear etiology but differential includes nicotine  withdrawal, beta-agonist use, rigors from influenza, hypoxic/hypercarbic delerium or multifactorial due to  the above. Due to significant vital sign derangement, diaphoresis, as well as visible distress and failure of more conservative modalities such as maximally dosed Atarax , the decision was made to administer Ativan  0.5mg  once and monitor for signs of respiratory depression. Multiple calming measures such as redirection, repositioning, and deep breathing were also used with some effect. 5 minutes after Ativan  administration, patient was able to lie flat in bed, and at that point became less anxious. Vital signs improved to HR of 90 and BP of 150s/90.   -Ativan  0.5mg  once as above. -21mg  Nicotine  patch, old patch had fallen off -COPD exacerbation and flu treatment per initial plan -Continue Bupropion 150mg  BID, Hydroxyzine  50mg  TID PRN -Avoid routine benzo use given respiratory status -VBG now to assess for hypercarbia.  PAD Untreated HCV On arrival to the patients room, feet were cold mottled and purple-tinged. Unable to find DP pulses on the left. Once patient had lied in bed and became less anxious, perfusion to his bilateral feet improved. However, we were still unable to obtain pulses in the left DP. There was also unilateral swelling of the right lower extremity. Question whether some of this could be a cryoglobinemia in the setting of his untreated Hepatitis C. He does have a documented history of PAD. Additionally, he does have unilateral swelling on his right lower extremity, for which we will get DVT studies for.  - Bilateral ABIs  - Bilateral DVT studies  - Consider HCV RNA quant and cryoglobulin studies if livedo reticularis of the bilateral knees does not resolve with warming. -Continue Eliquis  5mg  BID - Defer vascular consult for know given lack of other signs of critical limb ischemia other than pulselessness,  and improvement of his feet coloration with warming and elevation of the feet.  Melvenia Morrison Internal Medicine Resident PGY-1 Please contact the on-call pager after 5 pm  and on weekends at [phone]

## 2024-03-08 NOTE — Progress Notes (Signed)
 PHARMACY - ANTICOAGULATION CONSULT NOTE  Pharmacy Consult for Eliquis  to Heparin   Indication: atrial fibrillation  Allergies  Allergen Reactions   Ivp Dye [Iodinated Contrast Media] Other (See Comments)    Seizures    Ultram [Tramadol] Other (See Comments)    Seizures    Vibramycin  [Doxycycline ] Other (See Comments)    Burning sensation to his hands.    Patient Measurements: Weight: 93.4 kg (206 lb)  Vital Signs: Temp: 98.1 F (36.7 C) (11/20 1234) Temp Source: Axillary (11/20 1234) BP: 89/62 (11/20 1500) Pulse Rate: 63 (11/20 1502)  Labs: Recent Labs    03/07/24 0946 03/07/24 0958 03/07/24 0959 03/07/24 1302 03/08/24 0435 03/08/24 1018 03/08/24 1312  HGB 16.1 17.3*   < >  --  14.4 15.6 14.3  HCT 50.2 51.0   < >  --  45.5 46.0 42.0  PLT 292  --   --   --  207  --   --   LABPROT 15.5*  --   --   --   --   --   --   INR 1.2  --   --   --   --   --   --   CREATININE 1.50* 1.40*  --   --  1.34*  --   --   CKTOTAL  --   --   --   --  92  --   --   TROPONINIHS 28*  --   --  24*  --   --   --    < > = values in this interval not displayed.    Estimated Creatinine Clearance: 67 mL/min (A) (by C-G formula based on SCr of 1.34 mg/dL (H)).   Medical History: Past Medical History:  Diagnosis Date   Atrial flutter with rapid ventricular response (HCC) 08/14/2023   CHF (congestive heart failure) (HCC)    COPD (chronic obstructive pulmonary disease) (HCC)    COPD with acute exacerbation (HCC) 10/21/2021   Coronary artery disease    a. s/p prior PCI.   Hepatitis-C    History of CHF (congestive heart failure) 01/28/2024   History of stroke 02/13/2023   Hypertension    IV drug abuse (HCC)    a. 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   Medically noncompliant    MI (myocardial infarction) (HCC)    TIA (transient ischemic attack) 02/14/2023   Tobacco abuse    Assessment: 63 year old male to transition from Eliquis  to Heparin  for history of Afib. Last dose  of Eliquis  11/19 PM  Goal of Therapy:  aPTT 66-102 seconds Monitor platelets by anticoagulation protocol: Yes Heparin  level 0.3-0.7   Plan:  Heparin  at 1300 units/hr 6 hour heparin  level, PTT Daily heparin  level, PTT, CBC  Thank you. Olam Monte, PharmD  03/08/2024,3:12 PM

## 2024-03-08 NOTE — Progress Notes (Signed)
 BLE diffuse mottling mostly on knees and cool ext. Difficulty finding pulses on left. Femoral present. ABI showing critical limb ischemia on LLE. Vascular consulted. Repeat ck and la.   Was started on precedex  for anxiety but now more altered. Precedex  stopped. Consider abg if not waking up with precedex  off.   JD Emilio RIGGERS  Pulmonary & Critical Care 03/08/2024, 3:46 PM  Please see [email or web address] for pager details.  From 7A-7P if no response, please call [phone]. After hours, please call ELink [phone].

## 2024-03-08 NOTE — Progress Notes (Signed)
 RT called, RN to room due to pt having increased anxiety and pt refusing BIPAP. MD Azadegan notified and came to bedside. 40 mg lasix  given per MD. Pt placed on NRB satting mid to high 90s, though still tachypneic and anxious.  IM team and rapid at bedside. Transported to ICU.

## 2024-03-08 NOTE — Progress Notes (Signed)
 RT came to patients room for morning assessment.  Patient sitting on the side of the bed very anxious not tolerating the BiPAP.  RT placed patient on 5L [place].  Patient having what appears to be an anxiety attack.  MD notified

## 2024-03-09 ENCOUNTER — Inpatient Hospital Stay (HOSPITAL_COMMUNITY)

## 2024-03-09 DIAGNOSIS — J09X2 Influenza due to identified novel influenza A virus with other respiratory manifestations: Secondary | ICD-10-CM | POA: Diagnosis not present

## 2024-03-09 DIAGNOSIS — I70222 Atherosclerosis of native arteries of extremities with rest pain, left leg: Secondary | ICD-10-CM | POA: Diagnosis not present

## 2024-03-09 DIAGNOSIS — J9621 Acute and chronic respiratory failure with hypoxia: Secondary | ICD-10-CM | POA: Diagnosis not present

## 2024-03-09 DIAGNOSIS — Z9911 Dependence on respirator [ventilator] status: Secondary | ICD-10-CM

## 2024-03-09 DIAGNOSIS — I5033 Acute on chronic diastolic (congestive) heart failure: Secondary | ICD-10-CM | POA: Diagnosis not present

## 2024-03-09 DIAGNOSIS — I5020 Unspecified systolic (congestive) heart failure: Secondary | ICD-10-CM

## 2024-03-09 DIAGNOSIS — J1001 Influenza due to other identified influenza virus with the same other identified influenza virus pneumonia: Secondary | ICD-10-CM | POA: Diagnosis not present

## 2024-03-09 DIAGNOSIS — J9622 Acute and chronic respiratory failure with hypercapnia: Secondary | ICD-10-CM | POA: Diagnosis not present

## 2024-03-09 DIAGNOSIS — I4891 Unspecified atrial fibrillation: Secondary | ICD-10-CM | POA: Diagnosis not present

## 2024-03-09 DIAGNOSIS — Z1152 Encounter for screening for COVID-19: Secondary | ICD-10-CM | POA: Diagnosis not present

## 2024-03-09 DIAGNOSIS — I110 Hypertensive heart disease with heart failure: Secondary | ICD-10-CM | POA: Diagnosis not present

## 2024-03-09 DIAGNOSIS — J1008 Influenza due to other identified influenza virus with other specified pneumonia: Secondary | ICD-10-CM | POA: Diagnosis not present

## 2024-03-09 DIAGNOSIS — J449 Chronic obstructive pulmonary disease, unspecified: Secondary | ICD-10-CM | POA: Diagnosis not present

## 2024-03-09 DIAGNOSIS — I272 Pulmonary hypertension, unspecified: Secondary | ICD-10-CM | POA: Diagnosis not present

## 2024-03-09 DIAGNOSIS — N179 Acute kidney failure, unspecified: Secondary | ICD-10-CM | POA: Diagnosis not present

## 2024-03-09 DIAGNOSIS — I482 Chronic atrial fibrillation, unspecified: Secondary | ICD-10-CM

## 2024-03-09 DIAGNOSIS — Z515 Encounter for palliative care: Secondary | ICD-10-CM | POA: Diagnosis not present

## 2024-03-09 DIAGNOSIS — J440 Chronic obstructive pulmonary disease with acute lower respiratory infection: Secondary | ICD-10-CM | POA: Diagnosis not present

## 2024-03-09 DIAGNOSIS — Z66 Do not resuscitate: Secondary | ICD-10-CM | POA: Diagnosis not present

## 2024-03-09 LAB — BASIC METABOLIC PANEL WITH GFR
Anion gap: 12 (ref 5–15)
Anion gap: 10 (ref 5–15)
BUN: 33 mg/dL — ABNORMAL HIGH (ref 8–23)
BUN: 40 mg/dL — ABNORMAL HIGH (ref 8–23)
CO2: 32 mmol/L (ref 22–32)
CO2: 38 mmol/L — ABNORMAL HIGH (ref 22–32)
Calcium: 8.6 mg/dL — ABNORMAL LOW (ref 8.9–10.3)
Calcium: 8.9 mg/dL (ref 8.9–10.3)
Chloride: 99 mmol/L (ref 98–111)
Chloride: 93 mmol/L — ABNORMAL LOW (ref 98–111)
Creatinine, Ser: 1.42 mg/dL — ABNORMAL HIGH (ref 0.61–1.24)
Creatinine, Ser: 1.65 mg/dL — ABNORMAL HIGH (ref 0.61–1.24)
GFR, Estimated: 56 mL/min — ABNORMAL LOW (ref >60)
GFR, Estimated: 46 mL/min — ABNORMAL LOW (ref >60)
Glucose, Bld: 154 mg/dL — ABNORMAL HIGH (ref 70–99)
Glucose, Bld: 158 mg/dL — ABNORMAL HIGH (ref 70–99)
Potassium: 5 mmol/L (ref 3.5–5.1)
Potassium: 4.6 mmol/L (ref 3.5–5.1)
Sodium: 143 mmol/L (ref 135–145)
Sodium: 141 mmol/L (ref 135–145)

## 2024-03-09 LAB — GLUCOSE, CAPILLARY
Glucose-Capillary: 155 mg/dL — ABNORMAL HIGH (ref 70–99)
Glucose-Capillary: 151 mg/dL — ABNORMAL HIGH (ref 70–99)
Glucose-Capillary: 284 mg/dL — ABNORMAL HIGH (ref 70–99)
Glucose-Capillary: 169 mg/dL — ABNORMAL HIGH (ref 70–99)
Glucose-Capillary: 148 mg/dL — ABNORMAL HIGH (ref 70–99)
Glucose-Capillary: 139 mg/dL — ABNORMAL HIGH (ref 70–99)

## 2024-03-09 LAB — CBC
HCT: 41.7 % (ref 39.0–52.0)
Hemoglobin: 13.1 g/dL (ref 13.0–17.0)
MCH: 31.4 pg (ref 26.0–34.0)
MCHC: 31.4 g/dL (ref 30.0–36.0)
MCV: 100 fL (ref 80.0–100.0)
Platelets: 175 K/uL (ref 150–400)
RBC: 4.17 MIL/uL — ABNORMAL LOW (ref 4.22–5.81)
RDW: 14.1 % (ref 11.5–15.5)
WBC: 11.3 K/uL — ABNORMAL HIGH (ref 4.0–10.5)
nRBC: 0 % (ref 0.0–0.2)

## 2024-03-09 LAB — APTT
aPTT: 134 s — ABNORMAL HIGH (ref 24–36)
aPTT: 71 s — ABNORMAL HIGH (ref 24–36)

## 2024-03-09 LAB — LEGIONELLA PNEUMOPHILA SEROGP 1 UR AG: L. pneumophila Serogp 1 Ur Ag: NEGATIVE

## 2024-03-09 LAB — HEPARIN LEVEL (UNFRACTIONATED): Heparin Unfractionated: >1.1 [IU]/mL — ABNORMAL HIGH (ref 0.30–0.70)

## 2024-03-09 MED ORDER — OXYCODONE HCL 5 MG PO TABS
5.0000 mg | ORAL_TABLET | Freq: Once | ORAL | Status: DC
Start: 2024-03-09 — End: 2024-03-09

## 2024-03-09 MED ORDER — CARMEX CLASSIC LIP BALM EX OINT
TOPICAL_OINTMENT | CUTANEOUS | Status: DC | PRN
  Filled 2024-03-09: qty 10

## 2024-03-09 MED ORDER — MORPHINE SULFATE (PF) 2 MG/ML IV SOLN
INTRAVENOUS | Status: AC
  Administered 2024-03-09: 4 mg via INTRAVENOUS
  Filled 2024-03-09: qty 2

## 2024-03-09 MED ORDER — ADULT MULTIVITAMIN W/MINERALS CH
1.0000 | ORAL_TABLET | Freq: Every day | ORAL | Status: DC
  Administered 2024-03-09 – 2024-03-13 (×5): 1 via ORAL
  Filled 2024-03-09 (×5): qty 1

## 2024-03-09 MED ORDER — FOLIC ACID 1 MG PO TABS
1.0000 mg | ORAL_TABLET | Freq: Every day | ORAL | Status: DC
  Administered 2024-03-09 – 2024-03-14 (×6): 1 mg via ORAL
  Filled 2024-03-09 (×6): qty 1

## 2024-03-09 MED ORDER — DILTIAZEM HCL ER COATED BEADS 120 MG PO CP24
120.0000 mg | ORAL_CAPSULE | Freq: Every day | ORAL | Status: DC
  Administered 2024-03-09 – 2024-03-13 (×5): 120 mg via ORAL
  Filled 2024-03-09 (×6): qty 1

## 2024-03-09 MED ORDER — OSELTAMIVIR PHOSPHATE 30 MG PO CAPS
30.0000 mg | ORAL_CAPSULE | Freq: Two times a day (BID) | ORAL | Status: DC
  Administered 2024-03-10 – 2024-03-11 (×3): 30 mg via ORAL
  Filled 2024-03-09 (×5): qty 1

## 2024-03-09 MED ORDER — OXYCODONE HCL 5 MG PO TABS: 5.0000 mg | ORAL_TABLET | Freq: Once | ORAL | Status: DC

## 2024-03-09 MED ORDER — LIDOCAINE VISCOUS HCL 2 % MT SOLN
15.0000 mL | Freq: Once | OROMUCOSAL | Status: AC
  Administered 2024-03-09: 15 mL via ORAL
  Filled 2024-03-09: qty 15

## 2024-03-09 MED ORDER — OLANZAPINE 10 MG IM SOLR
5.0000 mg | Freq: Once | INTRAMUSCULAR | Status: AC
  Administered 2024-03-09: 5 mg via INTRAVENOUS
  Filled 2024-03-09: qty 10

## 2024-03-09 MED ORDER — ACETAMINOPHEN 325 MG PO TABS
650.0000 mg | ORAL_TABLET | Freq: Four times a day (QID) | ORAL | Status: DC | PRN
  Administered 2024-03-09: 650 mg via ORAL
  Filled 2024-03-09: qty 2

## 2024-03-09 MED ORDER — ALUM & MAG HYDROXIDE-SIMETH 200-200-20 MG/5ML PO SUSP
30.0000 mL | Freq: Once | ORAL | Status: DC
  Filled 2024-03-09: qty 30

## 2024-03-09 MED ORDER — OLANZAPINE 10 MG IM SOLR
5.0000 mg | Freq: Once | INTRAMUSCULAR | Status: AC
  Administered 2024-03-09: 5 mg via INTRAMUSCULAR

## 2024-03-09 MED ORDER — PREDNISONE 20 MG PO TABS
40.0000 mg | ORAL_TABLET | Freq: Every day | ORAL | Status: AC
Start: 2024-03-10 — End: 2024-03-13
  Administered 2024-03-10 – 2024-03-12 (×3): 40 mg via ORAL
  Filled 2024-03-09 (×3): qty 2

## 2024-03-09 MED ORDER — DOCUSATE SODIUM 100 MG PO CAPS
100.0000 mg | ORAL_CAPSULE | Freq: Two times a day (BID) | ORAL | Status: DC
  Administered 2024-03-09 – 2024-03-10 (×2): 100 mg via ORAL
  Filled 2024-03-09 (×4): qty 1

## 2024-03-09 MED ORDER — METOPROLOL TARTRATE 25 MG PO TABS
25.0000 mg | ORAL_TABLET | Freq: Two times a day (BID) | ORAL | Status: DC
  Administered 2024-03-09 – 2024-03-13 (×10): 25 mg via ORAL
  Filled 2024-03-09 (×10): qty 1

## 2024-03-09 MED ORDER — OLANZAPINE 10 MG IM SOLR
5.0000 mg | Freq: Once | INTRAMUSCULAR | Status: AC
  Administered 2024-03-09: 5 mg via INTRAVENOUS
  Filled 2024-03-09 (×2): qty 10

## 2024-03-09 MED ORDER — FUROSEMIDE 10 MG/ML IJ SOLN
40.0000 mg | Freq: Once | INTRAMUSCULAR | Status: AC
  Administered 2024-03-09: 40 mg via INTRAVENOUS
  Filled 2024-03-09: qty 4

## 2024-03-09 MED ORDER — FAMOTIDINE 20 MG PO TABS: 20.0000 mg | ORAL_TABLET | Freq: Two times a day (BID) | ORAL | Status: DC

## 2024-03-09 MED ORDER — THIAMINE MONONITRATE 100 MG PO TABS
100.0000 mg | ORAL_TABLET | Freq: Every day | ORAL | Status: DC
  Administered 2024-03-09 – 2024-03-13 (×5): 100 mg via ORAL
  Filled 2024-03-09 (×5): qty 1

## 2024-03-09 MED ORDER — MORPHINE SULFATE (PF) 2 MG/ML IV SOLN: 4.0000 mg | Freq: Once | INTRAVENOUS | Status: AC

## 2024-03-09 MED ORDER — ALUM & MAG HYDROXIDE-SIMETH 200-200-20 MG/5ML PO SUSP
30.0000 mL | Freq: Once | ORAL | Status: AC
  Administered 2024-03-09: 30 mL via ORAL
  Filled 2024-03-09: qty 30

## 2024-03-09 MED ORDER — POLYETHYLENE GLYCOL 3350 17 G PO PACK
17.0000 g | PACK | Freq: Every day | ORAL | Status: DC
  Filled 2024-03-09 (×3): qty 1

## 2024-03-09 MED ORDER — HYDROXYZINE HCL 25 MG PO TABS
25.0000 mg | ORAL_TABLET | Freq: Three times a day (TID) | ORAL | Status: DC | PRN
  Administered 2024-03-09 – 2024-03-10 (×4): 25 mg via ORAL
  Filled 2024-03-09 (×3): qty 1

## 2024-03-09 MED ORDER — OXYCODONE HCL 5 MG PO TABS
5.0000 mg | ORAL_TABLET | Freq: Once | ORAL | Status: AC
  Administered 2024-03-09: 5 mg via ORAL
  Filled 2024-03-09: qty 1

## 2024-03-09 NOTE — Progress Notes (Signed)
 PHARMACY - ANTICOAGULATION CONSULT NOTE  Pharmacy Consult:  Heparin   Indication: atrial fibrillation  Allergies  Allergen Reactions   Ivp Dye [Iodinated Contrast Media] Other (See Comments)    Seizures    Ultram [Tramadol] Other (See Comments)    Seizures    Vibramycin  [Doxycycline ] Other (See Comments)    Burning sensation to his hands.    Patient Measurements: Height: 6' (182.9 cm) Weight: 93.4 kg (205 lb 14.6 oz) IBW/kg (Calculated) : 77.6 HEPARIN  DW (KG): 93.4  Vital Signs: Temp: 98.3 F (36.8 C) (11/21 1125) Temp Source: Oral (11/21 1125) BP: 158/107 (11/21 1330) Pulse Rate: 103 (11/21 1330)  Labs: Recent Labs    03/07/24 0946 03/07/24 0958 03/07/24 0959 03/07/24 1302 03/08/24 0435 03/08/24 1018 03/08/24 1312 03/08/24 1546 03/08/24 1637 03/09/24 0254 03/09/24 1306  HGB 16.1 17.3*   < >  --  14.4   < > 14.3 13.6  --  13.1  --   HCT 50.2 51.0   < >  --  45.5   < > 42.0 40.0  --  41.7  --   PLT 292  --   --   --  207  --   --   --   --  175  --   APTT  --   --   --   --   --   --   --   --   --  134* 71*  LABPROT 15.5*  --   --   --   --   --   --   --   --   --   --   INR 1.2  --   --   --   --   --   --   --   --   --   --   HEPARINUNFRC  --   --   --   --   --   --   --   --   --  >1.10*  --   CREATININE 1.50* 1.40*  --   --  1.34*  --   --   --   --  1.42*  --   CKTOTAL  --   --   --   --  92  --   --   --  49  --   --   TROPONINIHS 28*  --   --  24*  --   --   --   --   --   --   --    < > = values in this interval not displayed.    Estimated Creatinine Clearance: 63.2 mL/min (A) (by C-G formula based on SCr of 1.42 mg/dL (H)).   Assessment: 37 YOM with history of Afib to continue on IV heparin  while Eliquis  is on hold, last dose of Eliquis  was on 11/19 PM.  Currently using aPTT to guide heparin  dosing.  aPTT therapeutic at 71 sec post rate reduction.  No issue with heparin  infusion nor bleeding per RN.  Goal of Therapy:  Heparin  level 0.3-0.7  units/ml aPTT 66-102 seconds Monitor platelets by anticoagulation protocol: Yes   Plan:  Continue heparin  gtt at 950 units/hr Daily heparin  level, aPTT and CBc  Shatori Bertucci D. L[place], PharmD, BCPS, BCCCP 03/09/2024, 1:49 PM

## 2024-03-09 NOTE — Progress Notes (Signed)
 eLink Physician-Brief Progress Note Patient Name: Gene Glazebrook DOB: [birth year] MRN: [id number]   Date of Service  03/09/2024  HPI/Events of Note  Ongoing anxiety and difficulty with BiPAP, requesting Zyprexa   eICU Interventions  Ordered        Saidee Geremia 03/09/2024, 9:20 PM

## 2024-03-09 NOTE — TOC Initial Note (Addendum)
 Transition of Care [facility]) - Initial/Assessment Note    Patient Details  Name: Francisco [name] MRN: [id number] Date of Birth: 11-16-1960  Transition of Care [facility]) CM/SW Contact:    Lauraine FORBES Saa, LCSWA Phone Number: 03/09/2024, 11:59 AM  Clinical Narrative:                  12:00 PM CSW introduced self and role to patient. Patient informed CSW that he resides at home alone and would need transportation for discharge. Patient informed CSW that he uses cab to attend medical appointments. Patient confirmed he uses DME (oxygen ) at home from [employer]. Patinet confirmed he does not have SNF or HH history but would be interested in additional services at home and would be agreeable with discharging to SNF. CSW requested medical team to place PT/OT orders. Patient confirmed insurance coverage. Per chart review, patient has a PCP. [name] preferred pharmacy's are [facility], [facility], [facility], and [place] [address] [address]. TOC will continue to follow.  Expected Discharge Plan: Home/Self Care Barriers to Discharge: Continued Medical Work up   Patient Goals and CMS Choice Patient states their goals for this hospitalization and ongoing recovery are:: to return home          Expected Discharge Plan and Services       Living arrangements for the past 2 months: Single Family Home                                      Prior Living Arrangements/Services Living arrangements for the past 2 months: Single Family Home Lives with:: Roommate Patient language and need for interpreter reviewed:: Yes        Need for Family Participation in Patient Care: No (Comment) Care giver support system in place?: No (comment) Current home services: DME Criminal Activity/Legal Involvement Pertinent to Current Situation/Hospitalization: No - Comment as needed  Activities of Daily Living      Permission  Sought/Granted Permission sought to share information with : Family Supports Permission granted to share information with : No (Contact information on chart)  Share Information with NAME: Marinell Dam     Permission granted to share info w Relationship: Spouse  Permission granted to share info w Contact Information: [phone]  Emotional Assessment Appearance:: Appears stated age Attitude/Demeanor/Rapport: Engaged Affect (typically observed): Accepting, Appropriate, Adaptable, Calm, Stable, Pleasant Orientation: : Oriented to Self, Oriented to Place, Oriented to  Time, Oriented to Situation Alcohol  / Substance Use: Not Applicable Psych Involvement: No (comment)  Admission diagnosis:  COPD with acute exacerbation (HCC) [J44.1] Patient Active Problem List   Diagnosis Date Noted   Acute on chronic respiratory failure with hypoxia and hypercapnia (HCC) 03/08/2024   Influenza A 03/08/2024   Tobacco use disorder 03/05/2024   Longstanding persistent atrial fibrillation (HCC) 03/04/2024   Tobacco abuse 03/04/2024   COPD, frequent exacerbations (HCC) 03/02/2024   Chest pain 02/28/2024   Acute respiratory failure with hypoxia (HCC) 02/28/2024   COPD exacerbation (HCC) 02/28/2024   Acute on chronic hypoxic respiratory failure (HCC) 10/30/2023   Paroxysmal A-fib (HCC) 08/13/2023   COPD with acute exacerbation (HCC) 08/13/2023   Seasonal allergies 07/29/2023   Hyperlipidemia 04/29/2023   Benzodiazepine causing adverse effect in therapeutic use 02/14/2023   Pulmonary hypertension (HCC) 02/05/2023   Fatigue 09/28/2022   Right knee pain 08/26/2022   Weight loss 02/19/2022  Combined systolic and diastolic congestive heart failure (HCC) 08/13/2021   Lung nodules 06/24/2021   Hepatitis C 06/24/2021   Back pain 06/24/2021   Anxiety state 05/01/2021   Essential hypertension 04/26/2021   Continuous dependence on cigarette smoking 01/16/2021   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 [place] [address] Phone: [phone] Fax: [phone]  [facility] [address] [place] [address] Phone: [phone] Fax: [phone]     Social Drivers of Health (SDOH) Social History: SDOH Screenings   Food Insecurity: No Food Insecurity (03/04/2024)  Housing: Low Risk  (03/04/2024)  Transportation Needs: No Transportation Needs (03/04/2024)  Utilities: Not At Risk (03/04/2024)  Alcohol  Screen: Low Risk  (02/07/2023)  Depression (PHQ2-9): Low Risk  (02/08/2024)  Recent Concern: Depression (PHQ2-9) - High Risk (11/21/2023)  Financial Resource Strain: High Risk (08/24/2023)   Received from [facility]  Social Connections: Unknown (08/14/2023)  Tobacco Use: High Risk (03/07/2024)  Health Literacy: Adequate Health Literacy (11/21/2023)   SDOH Interventions:     Readmission Risk Interventions     No data to display

## 2024-03-09 NOTE — Progress Notes (Signed)
 PHARMACY - ANTICOAGULATION CONSULT NOTE  Pharmacy Consult for Eliquis  to Heparin   Indication: atrial fibrillation  Patient Measurements: Weight: 93.4 kg (206 lb)  Vital Signs: Temp: 97.8 F (36.6 C) (11/21 0316) Temp Source: Oral (11/21 0316) BP: 149/86 (11/21 0130) Pulse Rate: 90 (11/21 0130)  Labs: Recent Labs    03/07/24 0946 03/07/24 0958 03/07/24 0959 03/07/24 1302 03/08/24 0435 03/08/24 1018 03/08/24 1312 03/08/24 1546 03/08/24 1637 03/09/24 0254  HGB 16.1 17.3*   < >  --  14.4   < > 14.3 13.6  --  13.1  HCT 50.2 51.0   < >  --  45.5   < > 42.0 40.0  --  41.7  PLT 292  --   --   --  207  --   --   --   --  175  APTT  --   --   --   --   --   --   --   --   --  134*  LABPROT 15.5*  --   --   --   --   --   --   --   --   --   INR 1.2  --   --   --   --   --   --   --   --   --   HEPARINUNFRC  --   --   --   --   --   --   --   --   --  >1.10*  CREATININE 1.50* 1.40*  --   --  1.34*  --   --   --   --  1.42*  CKTOTAL  --   --   --   --  92  --   --   --  49  --   TROPONINIHS 28*  --   --  24*  --   --   --   --   --   --    < > = values in this interval not displayed.    Estimated Creatinine Clearance: 63.2 mL/min (A) (by C-G formula based on SCr of 1.42 mg/dL (H)).   Medical History: Past Medical History:  Diagnosis Date   Atrial flutter with rapid ventricular response (HCC) 08/14/2023   CHF (congestive heart failure) (HCC)    COPD (chronic obstructive pulmonary disease) (HCC)    COPD with acute exacerbation (HCC) 10/21/2021   Coronary artery disease    a. s/p prior PCI.   Hepatitis-C    History of CHF (congestive heart failure) 01/28/2024   History of stroke 02/13/2023   Hypertension    IV drug abuse (HCC)    a. 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   Medically noncompliant    MI (myocardial infarction) (HCC)    TIA (transient ischemic attack) 02/14/2023   Tobacco abuse    Assessment: 63 year old male to transition from Eliquis  to  Heparin  for history of Afib. Last dose of Eliquis  11/19 PM  -AM: Heparin  level falsely elevated given recent eliquis  use and aPTT supra-therapeutic on 1300 units/hr. Per RN, no signs/symptoms of bleeding and level drawn appropriately   Goal of Therapy:  aPTT 66-102 seconds Monitor platelets by anticoagulation protocol: Yes Heparin  level 0.3-0.7   Plan:  Decrease heparin  to 950 units/hr 6 hour heparin  level aPTT Daily heparin  level, aPTT, CBC  Lynwood Poplar, PharmD, BCPS Clinical Pharmacist 03/09/2024 3:43 AM

## 2024-03-09 NOTE — Progress Notes (Signed)
 PT requested to go back on BIPAP at this time.  03/09/24 0500  BiPAP/CPAP/SIPAP  BiPAP/CPAP/SIPAP Pt Type Adult  BiPAP/CPAP/SIPAP SERVO  Mask Type Full face mask  Dentures removed? Not applicable  Mask Size Medium  Set Rate 10 breaths/min  Respiratory Rate 21 breaths/min  IPAP 20 cmH20  EPAP 10 cmH2O  Pressure Support 10 cmH20  PEEP 10 cmH20  FiO2 (%) 40 %  Flow Rate 0 lpm  Minute Ventilation 10.8  Leak 28  Peak Inspiratory Pressure (PIP) 19  Tidal Volume (Vt) 501  Patient Home Machine No  Patient Home Mask No  Patient Home Tubing No  Auto Titrate No  Press High Alarm 35 cmH2O  Press Low Alarm 2 cmH2O  Nasal massage performed Yes  CPAP/SIPAP surface wiped down Yes  Device Plugged into RED Power Outlet Yes  Oxygen  Percent 0 %

## 2024-03-09 NOTE — Progress Notes (Signed)
 Patient back in a panic, very anxious. Pulled Bipap off. States he cannot keep his bipap on and will not keep it on . He states he needs a break . Supplemental oxygen  placed back on patient . Informed RT

## 2024-03-09 NOTE — Progress Notes (Signed)
 PHARMACY NOTE:  ANTIMICROBIAL RENAL DOSAGE ADJUSTMENT  Current antimicrobial regimen includes a mismatch between antimicrobial dosage and estimated renal function.  As per policy approved by the Pharmacy & Therapeutics and Medical Executive Committees, the antimicrobial dosage will be adjusted accordingly.  Current antimicrobial dosage:  oseltamivir  75mg  BID  Indication: flu  Renal Function:  Estimated Creatinine Clearance: 54.4 mL/min (A) (by C-G formula based on SCr of 1.65 mg/dL (H)).  Antimicrobial dosage has been changed to:  oseltamivir  30mg  BID  Thank you for allowing pharmacy to be a part of this patient's care.  Vito Ralph, PharmD, BCPS Please see amion for complete clinical pharmacist phone list 03/09/2024 9:28 PM

## 2024-03-09 NOTE — Progress Notes (Signed)
 19:15 During bedside rounding, Patient agitated and irritable , he is requesting his nitroglycerin . He states he is having chest pain again . He states he doesn't understand why we will  not give him nitroglycerin  and taht he takes nitroglycerin  all the time at home . Patient yelling loudly  why wont y'all give me my home medications   Dr Olena on unit . I discussed patients concerns with him. He states patient does not need nitroglycerin . Verbal order given to give viscous lidocaine   once. Order placed by resident incorrect. Verbal order received from Dr Olena placed.

## 2024-03-09 NOTE — Progress Notes (Signed)
 1645 Pt called out complaining feeling short of breath and anxious. He is requesting his home Atarax  dose. Scheduled DuoNeb given. O2 87% (which has not been abnormal for pt due to his COPD). Spoke to resident on the floor and order placed for Atarax . Medication given. Emotional support given, coached pt to take slow deep breaths. RN stayed at bedside for support.  1705 Pt still complaining of feeling short of breath. PRN DuoNeb given and pt had short relief. But still stated  that he is feeling very anxious. RR 35s with HR 120s, O2 85%, hypertensive. RT called and pt placed on Bipap. Resident updated on events.  Resident and MD at pts bedside. Pt pulling at Bipap stating I can't breath. Stat orders given.  1745: Pt resting on Bipap and breathing looks very comfortable. RR now 20s and HR 110s. Emotional support given. Call bell in reach. Bed alarm on. BP stable.

## 2024-03-09 NOTE — Progress Notes (Signed)
 Restless, states he can't breath, he is now requesting zyprexa  by name . He states he was given this earlier and it helped him calm down. Patient given hydroxyzine   and informed elink . Placed on bipap

## 2024-03-09 NOTE — Progress Notes (Signed)
 Patient anxious,  abrasive and verbally attacking RN. I explained to him , I cannot give him any medication not ordered by provider . I informed him I have contacted Elink and waiting on  decision of his request.

## 2024-03-09 NOTE — Progress Notes (Signed)
 Patient states he is hungry but was told by RT, he could not eat till he cleared him. Contacted RT, how states its ok for patient to eat.

## 2024-03-09 NOTE — Progress Notes (Addendum)
  Progress Note    03/09/2024 10:35 AM  Subjective:  Pressors weaned off.  Denies pain in legs.  Tolerating nasal cannula this morning.   Vitals:   03/09/24 0744 03/09/24 0800  BP:  (!) 152/133  Pulse:  (!) 105  Resp:  (!) 23  Temp: 98.6 F (37 C)   SpO2:  93%   Physical Exam: Lungs:  non labored Extremities:  brisk AT and PT signals by doppler BLE; motor and sensation intact both feet Neurologic: A&O  CBC    Component Value Date/Time   WBC 11.3 (H) 03/09/2024 0254   RBC 4.17 (L) 03/09/2024 0254   HGB 13.1 03/09/2024 0254   HGB 15.5 12/06/2023 1528   HCT 41.7 03/09/2024 0254   HCT 47.1 12/06/2023 1528   PLT 175 03/09/2024 0254   PLT 285 12/06/2023 1528   MCV 100.0 03/09/2024 0254   MCV 97 12/06/2023 1528   MCH 31.4 03/09/2024 0254   MCHC 31.4 03/09/2024 0254   RDW 14.1 03/09/2024 0254   RDW 13.6 12/06/2023 1528   LYMPHSABS 2.0 03/07/2024 0946   LYMPHSABS 1.7 05/18/2021 1518   MONOABS 0.9 03/07/2024 0946   EOSABS 0.0 03/07/2024 0946   EOSABS 0.0 05/18/2021 1518   BASOSABS 0.0 03/07/2024 0946   BASOSABS 0.0 05/18/2021 1518    BMET    Component Value Date/Time   NA 143 03/09/2024 0254   NA 144 12/06/2023 1528   K 5.0 03/09/2024 0254   CL 99 03/09/2024 0254   CO2 32 03/09/2024 0254   GLUCOSE 154 (H) 03/09/2024 0254   BUN 33 (H) 03/09/2024 0254   BUN 9 12/06/2023 1528   CREATININE 1.42 (H) 03/09/2024 0254   CALCIUM  8.6 (L) 03/09/2024 0254   GFRNONAA 56 (L) 03/09/2024 0254   GFRAA >60 01/11/2020 0422    INR    Component Value Date/Time   INR 1.2 03/07/2024 0946     Intake/Output Summary (Last 24 hours) at 03/09/2024 1035 Last data filed at 03/09/2024 0800 Gross per 24 hour  Intake 1041.57 ml  Output 1700 ml  Net -658.43 ml     Assessment/Plan:  63 y.o. male with concern for LLE ischemia  Clinically, Francisco [name] is doing better.  He has been weaned from pressor support.  He continues to be without any pain in bilateral lower extremities.  On  exam motor and sensation is intact.  He has brisk AT and PT signals bilaterally.  No indication for angiogram currently.  Ok to have a diet today.  Call on-call vascular surgeon over the weekend if exam worsens.   Donnice Sender, PA-C [facility] [phone] 03/09/2024 10:35 AM  VASCULAR STAFF ADDENDUM: I have independently interviewed and examined the patient. I agree with the above.  Significant improvement in vascular exam off pressors and bipap. Patient looks much better today. Will plan for outpatient follow up for asymptomatic peripheral arterial disease Please call for questions over the weekend.  Debby SAILOR. Magda, MD [facility] Phone Number: [phone] 03/09/2024 12:09 PM

## 2024-03-09 NOTE — Transitions of Care (Post Inpatient/ED Visit) (Signed)
   03/09/2024  Name: Francisco [name] MRN: [id number] DOB: Jul 17, 1960  Today's TOC FU Call Status: Today's TOC FU Call Status:: Unsuccessful Call (3rd Attempt) Unsuccessful Call (1st Attempt) Date: 03/05/24 Unsuccessful Call (2nd Attempt) Date: 03/07/24 Unsuccessful Call (3rd Attempt) Date: 03/09/24  Attempted to reach the patient regarding the most recent Inpatient/ED visit.  Follow Up Plan: No further outreach attempts will be made at this time. We have been unable to contact the patient.  Signature Julian Lemmings, LPN [facility] Nurse Health Advisor Direct Dial [phone]

## 2024-03-09 NOTE — Progress Notes (Signed)
 NAME:  Francisco [name], MRN:  [id number], DOB:  1960-07-19, LOS: 2 ADMISSION DATE:  03/07/2024, CONSULTATION DATE:  03/08/2024  REFERRING MD:  Lovie CHIEF COMPLAINT:  Hypercarbia   History of Present Illness:  Francisco [name] is a 63 year old male with a pertinent past medical history significant for COPD with multiple recurrent exacerbations and previous hospitalizations for acute on chronic hypoxic respiratory failure, HFpEF, tobacco abuse, polysubstance use disorder, paroxysmal atrial fibrillation on Eliquis , CAD s/p PCI, HTN, who presented to [facility] ED on 11/19 for respiratory distress accompanied by worsening anxiety. He received SoluMedrol, IV Magnesium , and CTX and Azithromycin . CXR unremarkable, however was found to be positive for influenza A, started on Tamiflu . During a prior hospitalization, PCCM had recommended the patient be on home BiPAP, however due to logistics and time, the patient has not received the device. The IMTS noted his anxiety and administered Atarax  50mg  and a one-time dose of Ativan . It was during this time that mottling of the bilateral lower extremities was noticed, and US  ABIs and DVTs were ordered.  Upon evaluation the morning of 11/20, IMTS found the patient seated in the tripod position and visibly anxious, tachypneic and tachycardic. ABG revealed hypercapnia with a pCO2 of 105, pH of 7.22, pO2 of 295, and bicarb of 42.8. At that time, patient was refusing BiPAP and was placed on a NRB and given Lasix  40mg .  PCCM was consulted for the patient's worsening respiratory distress and was transferred to ICU.    Pertinent  Medical History   Past Medical History:  Diagnosis Date   Atrial flutter with rapid ventricular response (HCC) 08/14/2023   CHF (congestive heart failure) (HCC)    COPD (chronic obstructive pulmonary disease) (HCC)    COPD with acute exacerbation (HCC) 10/21/2021   Coronary artery disease    a. s/p prior PCI.   Hepatitis-C    History of CHF (congestive  heart failure) 01/28/2024   History of stroke 02/13/2023   Hypertension    IV drug abuse (HCC)    a. 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   Medically noncompliant    MI (myocardial infarction) (HCC)    TIA (transient ischemic attack) 02/14/2023   Tobacco abuse    Significant Hospital Events::  11/19- Admitted to [facility] via IMTS for COPD exacerbation 11/20- Transferred to ICU and placed on BiPAP for hypercapnia 11/20 -11/21 - Tolerated BiPAP well overnight, improving hypercapnia. 11/21-Weaned BiPAP intermittently during day as tolerated.   Interim History / Subjective:  Patient asleep when evaluated. He is resting comfortably on BiPAP in no acute respiratory distress.   Patient requiring a Zyprexa  5mg  injection for agitation overnight after nonpharmacological approaches were taken, including putting patient back on BiPAP.  Objective   Blood pressure (!) 141/81, pulse 90, temperature 98.3 F (36.8 C), temperature source Oral, resp. rate (!) 35, height 6' (1.829 m), weight 93.4 kg, SpO2 90%.    Vent Mode: BIPAP FiO2 (%):  [40 %] 40 % Set Rate:  [10 bmp] 10 bmp PEEP:  [10 cmH20] 10 cmH20 Pressure Support:  [10 cmH20] 10 cmH20   Intake/Output Summary (Last 24 hours) at 03/09/2024 1214 Last data filed at 03/09/2024 1100 Gross per 24 hour  Intake 1479.24 ml  Output 2500 ml  Net -1020.76 ml   Filed Weights   03/07/24 2024 03/09/24 0325  Weight: 93.4 kg 93.4 kg    Examination: General: critically ill-appearing male in no acute respiratory distress, sleeping comfortably on BiPAP HENT: MM moist, BiPAP placed Lungs: BiPAP 100% saturating  98%. Slight wheezing on auscultation. Cardiovascular: RRR, no murmurs, rubs or gallops Abdomen: soft, no tenderness to palpation, bowel sounds present. Extremities: cool and dry, no edema present. No pain to BLE, diffuse mottling present.  Neuro: A&O3, follows command, able to ambulate to restroom without difficulty.  Resolved problem  list   Assessment and Plan   Respiratory: Acute on chronic respiratory failure w/ hypoxia/hypercapnia: on 2L [place] at home COPD exacerbation Severe pulmonary HTN Influenza A positive P: -Wean BiPAP to intermittent use during day today, currently saturating 98% on FiO2 40%. ABG shows significant improvement comparatively to 11/20 -CXR without suggested evidence of pneumonia or secondary bacterial infection, will discontinue all antibiotics today. MSRA PCR negative -Will continue Tamiflu  for Influenza A -Will continue Duonebs q4hr, pulmicort  BID, may be able to switch to triple therapy inhaler later today. -If having further anxiety can consider precedex  if needed. Once he returns to floor, may resume Atarax  -Will continue IV methylprednisolone  40mg  BID for 5 days then discontinue, given recent hypercarbic respiratory failure, concurrent influenza infection, and COPD - Continue pulmonary toilet   Cardiology: Paroxsymal Atrial Fibrillation P: - Will continue IV Heparin  gtt due to patient's limb ischemia per vascular recommendation - Patient previously taking Eliquis , will hold for now - Will resume home Metoprolol , continue to hold home Diltiazem  for now; resume Dilt when appropriate   Hx of HFimpEF: EF 25-30% 2021 Hx of htn/hld P: - Last echo in 07/2023 shows EF 60-65%. Repeat echo showing 70-75% and LV hyperdynamic function. No valvular abnormality. - Will give Lasix  40mg  IV for euvolemia - Will resume gdmt slowly monitoring pressures, starting with Metoprolol  - PRN IV Hydralazine  for htn  - Resume statin   Limb Ischemia P:   -ABI showing limb ischemia of left lower extremity, VVS consulted. When assessed on 11/20, imaging had been done on vasopressor support, making pulses bilaterally difficult to obtain. Today 11/21, patient's pulses re-imaged and found to have brisk AT and PT signals bilaterally.  - Patient with motor and sensation intact bilaterally in BLE - No current indication  for CT Angiogram. If any worsening changes, call on-call VVS. Appreciate VVS recommendations. - Patient to be on regular diet - Continue Heparin  gtt - Monitor patient's pulses   History of CAD s/p PCI  P:  -Resume statin  Renal: AKI P: - Patient became hypotensive yesterday 2/2 potential Levophed  use, causing slight AKI - MAPs maintaining > 65 - Has been off Levophed  x 12 hours - Will repeat BMP to monitor Cr.  - If improved, slowly add back GDMT as tolerated  Other:   Hepatitis C Infection P: -History of HCV, patient to follow up with ID outpatient  Tobacco Use Disorder P: - Nicotine  patch   Substance Use Disorder P:  - Currently with recent methamphetamine use - Cessation counseling when appropriate   History of Anxiety/Depression P: - Hold hydroxyzine  and wellbutrin  for now   Critical care time: 30    Patient without requirements for intensive care, will transfer back to IMTS at 7AM on 11/22.  Nirvi Boehler, DO Internal Medicine Resident, PGY-1 Please contact the on call pager at [phone] for any urgent or emergent needs. 12:14 PM 03/09/2024

## 2024-03-09 NOTE — Progress Notes (Signed)
 Pt placed back on BIPAP.  03/08/24 2200  BiPAP/CPAP/SIPAP  BiPAP/CPAP/SIPAP Pt Type Adult  BiPAP/CPAP/SIPAP SERVO  Mask Type Full face mask  Dentures removed? Not applicable  Mask Size Medium  Set Rate 10 breaths/min  Respiratory Rate 22 breaths/min  IPAP 20 cmH20  EPAP 10 cmH2O  Pressure Support 10 cmH20  PEEP 10 cmH20  FiO2 (%) 40 %  Flow Rate 0 lpm  Minute Ventilation 10.9  Leak 32  Peak Inspiratory Pressure (PIP) 18  Tidal Volume (Vt) 489  Patient Home Machine No  Patient Home Mask No  Patient Home Tubing No  Auto Titrate No  Press High Alarm 35 cmH2O  Press Low Alarm 2 cmH2O  Nasal massage performed Yes  CPAP/SIPAP surface wiped down Yes  Device Plugged into RED Power Outlet Yes  Oxygen  Percent 0 %

## 2024-03-09 NOTE — Progress Notes (Signed)
 PT requested to be taken off the BIPAP, RT made PT aware CCM would like for him to wear the BIPAP at night, frequent breaks from the BIPAP are okay.  He stated  he was suffocating and could not breath.  Rt placed PT on [place] 5L. Sat's 93%. Pt continues hitting his beside table to make loud noise to get staffing's attention. He demands for extra oxygen  tubing to place in his mouth because it helps him breathe better. Pt is very argumentative with staff. RT will continue to monitor if PT would like to go back on the BIPAP.

## 2024-03-10 DIAGNOSIS — I2510 Atherosclerotic heart disease of native coronary artery without angina pectoris: Secondary | ICD-10-CM | POA: Diagnosis not present

## 2024-03-10 DIAGNOSIS — Z720 Tobacco use: Secondary | ICD-10-CM | POA: Diagnosis not present

## 2024-03-10 DIAGNOSIS — N179 Acute kidney failure, unspecified: Secondary | ICD-10-CM | POA: Diagnosis not present

## 2024-03-10 DIAGNOSIS — J441 Chronic obstructive pulmonary disease with (acute) exacerbation: Secondary | ICD-10-CM | POA: Diagnosis not present

## 2024-03-10 DIAGNOSIS — J101 Influenza due to other identified influenza virus with other respiratory manifestations: Secondary | ICD-10-CM | POA: Diagnosis not present

## 2024-03-10 DIAGNOSIS — I480 Paroxysmal atrial fibrillation: Secondary | ICD-10-CM | POA: Diagnosis not present

## 2024-03-10 DIAGNOSIS — F419 Anxiety disorder, unspecified: Secondary | ICD-10-CM | POA: Diagnosis not present

## 2024-03-10 DIAGNOSIS — I5020 Unspecified systolic (congestive) heart failure: Secondary | ICD-10-CM | POA: Diagnosis not present

## 2024-03-10 DIAGNOSIS — Z7952 Long term (current) use of systemic steroids: Secondary | ICD-10-CM | POA: Diagnosis not present

## 2024-03-10 DIAGNOSIS — Z7901 Long term (current) use of anticoagulants: Secondary | ICD-10-CM | POA: Diagnosis not present

## 2024-03-10 LAB — CBC
HCT: 41.5 % (ref 39.0–52.0)
Hemoglobin: 13.4 g/dL (ref 13.0–17.0)
MCH: 31.4 pg (ref 26.0–34.0)
MCHC: 32.3 g/dL (ref 30.0–36.0)
MCV: 97.2 fL (ref 80.0–100.0)
Platelets: 198 K/uL (ref 150–400)
RBC: 4.27 MIL/uL (ref 4.22–5.81)
RDW: 14.2 % (ref 11.5–15.5)
WBC: 12 K/uL — ABNORMAL HIGH (ref 4.0–10.5)
nRBC: 0.3 % — ABNORMAL HIGH (ref 0.0–0.2)

## 2024-03-10 LAB — BASIC METABOLIC PANEL WITH GFR
Anion gap: 11 (ref 5–15)
BUN: 41 mg/dL — ABNORMAL HIGH (ref 8–23)
CO2: 34 mmol/L — ABNORMAL HIGH (ref 22–32)
Calcium: 8.7 mg/dL — ABNORMAL LOW (ref 8.9–10.3)
Chloride: 98 mmol/L (ref 98–111)
Creatinine, Ser: 1.41 mg/dL — ABNORMAL HIGH (ref 0.61–1.24)
GFR, Estimated: 56 mL/min — ABNORMAL LOW (ref >60)
Glucose, Bld: 121 mg/dL — ABNORMAL HIGH (ref 70–99)
Potassium: 4.6 mmol/L (ref 3.5–5.1)
Sodium: 143 mmol/L (ref 135–145)

## 2024-03-10 LAB — APTT: aPTT: 54 s — ABNORMAL HIGH (ref 24–36)

## 2024-03-10 LAB — GLUCOSE, CAPILLARY: Glucose-Capillary: 137 mg/dL — ABNORMAL HIGH (ref 70–99)

## 2024-03-10 LAB — HEPARIN LEVEL (UNFRACTIONATED): Heparin Unfractionated: 0.74 [IU]/mL — ABNORMAL HIGH (ref 0.30–0.70)

## 2024-03-10 MED ORDER — LIDOCAINE 5 % EX PTCH
3.0000 | MEDICATED_PATCH | Freq: Every day | CUTANEOUS | Status: DC | PRN
  Administered 2024-03-10: 3 via TRANSDERMAL
  Filled 2024-03-10: qty 3

## 2024-03-10 MED ORDER — DICLOFENAC SODIUM 1 % EX GEL
2.0000 g | Freq: Four times a day (QID) | CUTANEOUS | Status: DC
  Administered 2024-03-10 – 2024-03-11 (×2): 2 g via TOPICAL
  Filled 2024-03-10: qty 100

## 2024-03-10 MED ORDER — OXYCODONE HCL 5 MG PO TABS
5.0000 mg | ORAL_TABLET | Freq: Once | ORAL | Status: AC | PRN
  Administered 2024-03-13: 5 mg via ORAL
  Filled 2024-03-10: qty 1

## 2024-03-10 MED ORDER — HYDROXYZINE HCL 25 MG PO TABS
50.0000 mg | ORAL_TABLET | Freq: Three times a day (TID) | ORAL | Status: DC | PRN
  Administered 2024-03-11 – 2024-03-13 (×6): 50 mg via ORAL
  Filled 2024-03-10 (×8): qty 2

## 2024-03-10 MED ORDER — ACETAMINOPHEN 500 MG PO TABS
1000.0000 mg | ORAL_TABLET | Freq: Four times a day (QID) | ORAL | Status: DC
  Administered 2024-03-11 – 2024-03-13 (×10): 1000 mg via ORAL
  Filled 2024-03-10 (×12): qty 2

## 2024-03-10 MED ORDER — APIXABAN 5 MG PO TABS
5.0000 mg | ORAL_TABLET | Freq: Two times a day (BID) | ORAL | Status: DC
  Administered 2024-03-10 – 2024-03-14 (×9): 5 mg via ORAL
  Filled 2024-03-10 (×9): qty 1

## 2024-03-10 MED ORDER — CYCLOBENZAPRINE HCL 5 MG PO TABS
5.0000 mg | ORAL_TABLET | Freq: Three times a day (TID) | ORAL | Status: DC | PRN
Start: 2024-03-10 — End: 2024-03-14
  Administered 2024-03-10 – 2024-03-12 (×4): 5 mg via ORAL
  Filled 2024-03-10 (×4): qty 1

## 2024-03-10 MED ORDER — IPRATROPIUM-ALBUTEROL 0.5-2.5 (3) MG/3ML IN SOLN
3.0000 mL | Freq: Four times a day (QID) | RESPIRATORY_TRACT | Status: DC
  Administered 2024-03-11 – 2024-03-12 (×6): 3 mL via RESPIRATORY_TRACT
  Filled 2024-03-10 (×6): qty 3

## 2024-03-10 NOTE — Progress Notes (Deleted)
 NAME:  Francisco [name], MRN:  [id number], DOB:  11/04/1960, LOS: 3 ADMISSION DATE:  03/07/2024, CONSULTATION DATE:  03/08/2024  REFERRING MD:  Lovie CHIEF COMPLAINT:  Hypercarbia   History of Present Illness:  Francisco [name] is a 63 year old male with a pertinent past medical history significant for COPD with multiple recurrent exacerbations and previous hospitalizations for acute on chronic hypoxic respiratory failure, HFpEF, tobacco abuse, polysubstance use disorder, paroxysmal atrial fibrillation on Eliquis , CAD s/p PCI, HTN, who presented to [facility] ED on 11/19 for respiratory distress accompanied by worsening anxiety. He received SoluMedrol, IV Magnesium , and CTX and Azithromycin . CXR unremarkable, however was found to be positive for influenza A, started on Tamiflu . During a prior hospitalization, PCCM had recommended the patient be on home BiPAP, however due to logistics and time, the patient has not received the device. The IMTS noted his anxiety and administered Atarax  50mg  and a one-time dose of Ativan . It was during this time that mottling of the bilateral lower extremities was noticed, and US  ABIs and DVTs were ordered.  Upon evaluation the morning of 11/20, IMTS found the patient seated in the tripod position and visibly anxious, tachypneic and tachycardic. ABG revealed hypercapnia with a pCO2 of 105, pH of 7.22, pO2 of 295, and bicarb of 42.8. At that time, patient was refusing BiPAP and was placed on a NRB and given Lasix  40mg .  PCCM was consulted for the patient's worsening respiratory distress and was transferred to ICU.    Pertinent  Medical History   Past Medical History:  Diagnosis Date   Atrial flutter with rapid ventricular response (HCC) 08/14/2023   CHF (congestive heart failure) (HCC)    COPD (chronic obstructive pulmonary disease) (HCC)    COPD with acute exacerbation (HCC) 10/21/2021   Coronary artery disease    a. s/p prior PCI.   Hepatitis-C    History of CHF (congestive  heart failure) 01/28/2024   History of stroke 02/13/2023   Hypertension    IV drug abuse (HCC)    a. 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   Medically noncompliant    MI (myocardial infarction) (HCC)    TIA (transient ischemic attack) 02/14/2023   Tobacco abuse    Significant Hospital Events::  11/19- Admitted to [facility] via IMTS for COPD exacerbation 11/20- Transferred to ICU and placed on BiPAP for hypercapnia 11/20 -11/21 - Tolerated BiPAP well overnight, improving hypercapnia. 11/21-Weaned BiPAP intermittently during day as tolerated.   Interim History / Subjective:  Patient asleep when evaluated. He is resting comfortably on BiPAP in no acute respiratory distress.   Patient requiring a Zyprexa  5mg  injection for agitation overnight after nonpharmacological approaches were taken, including putting patient back on BiPAP.  Objective   Blood pressure 138/85, pulse (!) 101, temperature 98.5 F (36.9 C), temperature source Oral, resp. rate (!) 27, height 6' (1.829 m), weight 90.5 kg, SpO2 92%.    Vent Mode: BIPAP FiO2 (%):  [40 %] 40 % Set Rate:  [10 bmp] 10 bmp PEEP:  [10 cmH20] 10 cmH20 Pressure Support:  [10 cmH20] 10 cmH20   Intake/Output Summary (Last 24 hours) at 03/10/2024 0826 Last data filed at 03/10/2024 0500 Gross per 24 hour  Intake 838.25 ml  Output 2550 ml  Net -1711.75 ml   Filed Weights   03/07/24 2024 03/09/24 0325 03/10/24 0605  Weight: 93.4 kg 93.4 kg 90.5 kg    Examination: General: critically ill-appearing male in no acute respiratory distress, sleeping comfortably on BiPAP HENT: MM moist, BiPAP placed  Lungs: BiPAP 100% saturating 98%. Slight wheezing on auscultation. Cardiovascular: RRR, no murmurs, rubs or gallops Abdomen: soft, no tenderness to palpation, bowel sounds present. Extremities: cool and dry, no edema present. No pain to BLE, diffuse mottling present.  Neuro: A&O3, follows command, able to ambulate to restroom without  difficulty.  Resolved problem list   Assessment and Plan   Respiratory: Acute on chronic respiratory failure w/ hypoxia/hypercapnia: on 2L [place] at home COPD exacerbation Severe pulmonary HTN Influenza A positive P: -Wean BiPAP to intermittent use during day today, currently saturating 98% on FiO2 40%. ABG shows significant improvement comparatively to 11/20 -CXR without suggested evidence of pneumonia or secondary bacterial infection, will discontinue all antibiotics today. MSRA PCR negative -Will continue Tamiflu  for Influenza A -Will continue Duonebs q4hr, pulmicort  BID, may be able to switch to triple therapy inhaler later today. -If having further anxiety can consider precedex  if needed. Once he returns to floor, may resume Atarax  -Will continue IV methylprednisolone  40mg  BID for 5 days then discontinue, given recent hypercarbic respiratory failure, concurrent influenza infection, and COPD - Continue pulmonary toilet   Cardiology: Paroxsymal Atrial Fibrillation P: - Will continue IV Heparin  gtt due to patient's limb ischemia per vascular recommendation - Patient previously taking Eliquis , will hold for now - Will resume home Metoprolol , continue to hold home Diltiazem  for now; resume Dilt when appropriate   Hx of HFimpEF: EF 25-30% 2021 Hx of htn/hld P: - Last echo in 07/2023 shows EF 60-65%. Repeat echo showing 70-75% and LV hyperdynamic function. No valvular abnormality. - Will give Lasix  40mg  IV for euvolemia - Will resume gdmt slowly monitoring pressures, starting with Metoprolol  - PRN IV Hydralazine  for htn  - Resume statin   Limb Ischemia P:   -ABI showing limb ischemia of left lower extremity, VVS consulted. When assessed on 11/20, imaging had been done on vasopressor support, making pulses bilaterally difficult to obtain. Today 11/21, patient's pulses re-imaged and found to have brisk AT and PT signals bilaterally.  - Patient with motor and sensation intact bilaterally  in BLE - No current indication for CT Angiogram. If any worsening changes, call on-call VVS. Appreciate VVS recommendations. - Patient to be on regular diet - Continue Heparin  gtt - Monitor patient's pulses   History of CAD s/p PCI  P:  -Resume statin  Renal: AKI P: - Patient became hypotensive yesterday 2/2 potential Levophed  use, causing slight AKI - MAPs maintaining > 65 - Has been off Levophed  x 12 hours - Will repeat BMP to monitor Cr.  - If improved, slowly add back GDMT as tolerated  Other:   Hepatitis C Infection P: -History of HCV, patient to follow up with ID outpatient  Tobacco Use Disorder P: - Nicotine  patch   Substance Use Disorder P:  - Currently with recent methamphetamine use - Cessation counseling when appropriate   History of Anxiety/Depression P: - Hold hydroxyzine  and wellbutrin  for now   Critical care time: 30    Patient without requirements for intensive care, will transfer back to IMTS at 7AM on 11/22.  Abhi Moccia D. Arloa, DO Internal Medicine Resident, PGY-1 Please contact the on call pager at [phone] for any urgent or emergent needs. 8:26 AM 03/10/2024

## 2024-03-10 NOTE — Progress Notes (Addendum)
 PHARMACY - ANTICOAGULATION CONSULT NOTE  Pharmacy Consult:  Heparin   Indication: atrial fibrillation  Allergies  Allergen Reactions   Ivp Dye [Iodinated Contrast Media] Other (See Comments)    Seizures    Ultram [Tramadol] Other (See Comments)    Seizures    Vibramycin  [Doxycycline ] Other (See Comments)    Burning sensation to his hands.    Patient Measurements: Height: 6' (182.9 cm) Weight: 90.5 kg (199 lb 8.3 oz) IBW/kg (Calculated) : 77.6 HEPARIN  DW (KG): 93.4> Error. Heparin  dosing weight = 90.5 kg (actual weight)  Vital Signs: Temp: 98.5 F (36.9 C) (11/22 0605) Temp Source: Oral (11/22 0605) BP: 138/85 (11/22 0605) Pulse Rate: 89 (11/22 0605)  Labs: Recent Labs    03/07/24 0946 03/07/24 0958 03/07/24 1302 03/08/24 0435 03/08/24 1018 03/08/24 1546 03/08/24 1637 03/09/24 0254 03/09/24 1306 03/09/24 1457 03/10/24 0300  HGB 16.1   < >  --  14.4   < > 13.6  --  13.1  --   --  13.4  HCT 50.2   < >  --  45.5   < > 40.0  --  41.7  --   --  41.5  PLT 292  --   --  207  --   --   --  175  --   --  198  APTT  --   --   --   --   --   --   --  134* 71*  --  54*  LABPROT 15.5*  --   --   --   --   --   --   --   --   --   --   INR 1.2  --   --   --   --   --   --   --   --   --   --   HEPARINUNFRC  --   --   --   --   --   --   --  >1.10*  --   --  0.74*  CREATININE 1.50*   < >  --  1.34*  --   --   --  1.42*  --  1.65* 1.41*  CKTOTAL  --   --   --  92  --   --  49  --   --   --   --   TROPONINIHS 28*  --  24*  --   --   --   --   --   --   --   --    < > = values in this interval not displayed.    Estimated Creatinine Clearance: 58.9 mL/min (A) (by C-G formula based on SCr of 1.41 mg/dL (H)).   Assessment: 42 YOM with history of Afib to continue on IV heparin  while Eliquis  is on hold, last dose of Eliquis  was on 11/19 PM.  Currently using aPTT to guide heparin  dosing.  aPTT has dropped low this AM at 54 - below goal. HL still elevated in setting of recent  Eliquis . No issue with heparin  infusion nor bleeding per RN. SCr stable. CBC stable and within normal limits.   Goal of Therapy:  Heparin  level 0.3-0.7 units/ml aPTT 66-102 seconds Monitor platelets by anticoagulation protocol: Yes   Plan:  Increase heparin  gtt to 1100 units/hr Recheck level in ~6 hours Daily heparin  level, aPTT and CBc  Addendum: Changed to Eliquis , Discontinue Heparin  protocol.   Harlene Boga, PharmD, BCPS, BCCCP  Please refer to [facility] for [facility] Pharmacy numbers 03/10/2024, 7:12 AM

## 2024-03-10 NOTE — Plan of Care (Signed)

## 2024-03-10 NOTE — Progress Notes (Signed)
 HD#3 SUBJECTIVE:  Patient Summary: Francisco [name] is a 63 year old man with a PMH of severe COPD with recurrent exacerbations, acute on chronic hypoxic/hypercapnic respiratory failure, HFrecEF, tobacco use disorder, polysubstance use, paroxysmal atrial fibrillation on Eliquis , CAD s/p PCI, hypertension, who presented with worsening dyspnea, anxiety, and hypercarbic respiratory failure and was admitted for COPD exacerbation with influenza A and acute on chronic respiratory failure requiring BiPAP.  Overnight Events: Required Zyprexa  5 mg IM for agitation after refusing BiPAP.Remained hemodynamically stable and off pressors.Intermittent BiPAP with improved gases.  Interim History: Patient seen resting in bed on 5 L [place]. Reports improved breathing. Still mildly anxious but cooperative. No chest pain. No leg pain. Denies numbness or tingling. No nausea/vomiting. Tolerating diet.patient complains of back pain.   OBJECTIVE:  Vital Signs: Vitals:   03/10/24 1056 03/10/24 1102 03/10/24 1131 03/10/24 1310  BP:  (!) 164/100  (!) 161/86  Pulse: 80 86 86 92  Resp: (!) 21 (!) 26 17 (!) 31  Temp:  97.8 F (36.6 C)    TempSrc:  Axillary    SpO2: 96% 94% 92% 91%  Weight:      Height:       Supplemental O2: Nasal Cannula SpO2: 91 % O2 Flow Rate (L/min): 5 L/min FiO2 (%): 40 %  Filed Weights   03/07/24 2024 03/09/24 0325 03/10/24 0605  Weight: 93.4 kg 93.4 kg 90.5 kg     Intake/Output Summary (Last 24 hours) at 03/10/2024 1355 Last data filed at 03/10/2024 0500 Gross per 24 hour  Intake 150.77 ml  Output 1750 ml  Net -1599.23 ml   Net IO Since Admission: -2,569.95 mL [03/10/24 1355]  Physical Exam: Physical Exam General: Awake, mildly anxious, no acute distress. HEENT: MMM, no JVD. Lungs: Mild wheezing; work of breathing improved on [place]. Cardiac: RRR, no murmurs. Abdomen: Soft, NT, ND. Extremities: No edema. Bilateral DP/PT pulses dopplerable and brisk. Mottling significantly improved.  Sensation and motor intact. Legs warm. Neuro: Alert, follows commands; no focal deficits. Patient Lines/Drains/Airways Status     Active Line/Drains/Airways     Name Placement date Placement time Site Days   Peripheral IV 03/07/24 18 G Left Antecubital 03/07/24  0917  Antecubital  3   Peripheral IV 03/07/24 20 G Anterior;Distal;Right;Upper Arm 03/07/24  0930  Arm  3   External Urinary Catheter 03/08/24  1100  --  2            Pertinent labs and imaging:      Latest Ref Rng & Units 03/10/2024    3:00 AM 03/09/2024    2:54 AM 03/08/2024    3:46 PM  CBC  WBC 4.0 - 10.5 K/uL 12.0  11.3    Hemoglobin 13.0 - 17.0 g/dL 86.5  86.8  86.3   Hematocrit 39.0 - 52.0 % 41.5  41.7  40.0   Platelets 150 - 400 K/uL 198  175         Latest Ref Rng & Units 03/10/2024    3:00 AM 03/09/2024    2:57 PM 03/09/2024    2:54 AM  CMP  Glucose 70 - 99 mg/dL 878  841  845   BUN 8 - 23 mg/dL 41  40  33   Creatinine 0.61 - 1.24 mg/dL 8.58  8.34  8.57   Sodium 135 - 145 mmol/L 143  141  143   Potassium 3.5 - 5.1 mmol/L 4.6  4.6  5.0   Chloride 98 - 111 mmol/L 98  93  99  CO2 22 - 32 mmol/L 34  38  32   Calcium  8.9 - 10.3 mg/dL 8.7  8.9  8.6     No results found.  ASSESSMENT/PLAN:  Assessment: Active Problems:   COPD with acute exacerbation (HCC)   Acute on chronic respiratory failure with hypoxia and hypercapnia (HCC)   Influenza A   Chronic atrial fibrillation (HCC)   Critical limb ischemia of left lower extremity (HCC)   Dependence on non-invasive ventilation (HCC)   Heart failure with recovered ejection fraction (HFrecEF) (HCC)   Plan: Francisco [name] is a 63 year old man with a PMH of severe COPD with recurrent exacerbations, acute on chronic hypoxic/hypercapnic respiratory failure, HFrecEF, tobacco use disorder, polysubstance use, paroxysmal atrial fibrillation on Eliquis , CAD s/p PCI, hypertension, who presented with worsening dyspnea, anxiety, and hypercarbic respiratory failure and  was admitted for COPD exacerbation with influenza A and acute on chronic respiratory failure requiring BiPAP.  # Acute on Chronic Hypercapnic/Hypoxic Respiratory Failure # COPD Exacerbation (severe, recurrent) The patient presented with severe dyspnea, anxiety-driven tachypnea, and ABG with pCO? > 100, consistent with acute decompensation on top of chronic CO? retention. Trigger likely multifactorial: influenza A infection, COPD exacerbation, anxiety leading to BiPAP intolerance, and mild volume overload. Now with improved gas exchange on intermittent BiPAP and transitioning to [place].Likely triggered by influenza A and ongoing tobacco use. Symptoms improved with bronchodilators and steroids. Wheezing mild today; still anxious but breathing better.  Plan -Intermittent BiPAP; Opp between sessions. -Continue Tamiflu ; discontinue CTX/azithro (no bacterial pneumonia). -Continue scheduled nebulizers and steroids as outlined. -Consider long-term need for home BiPAP once stable. -Pulmonary toilet. -Continue DuoNeb q4h, Pulmicort  BID. -After stabilization: consider single triple therapy inhaler. -Continue IV methylprednisolone  40 mg BID (5-day course). -Smoking cessation support.  # Influenza A /Primary Trigger of Exacerbation Confirmed positive. CXR normal. No evidence of secondary bacterial superinfection. Associated with hypercapnia and systemic inflammation Plan -Continue Tamiflu  full course.  # Paroxysmal Atrial Fibrillation Previously on Eliquis . Heparin  started due to concern for limb ischemia. Now pulses normal, and vascular recommends resuming Eliquis . Plan -Stop heparin  drip; resume Eliquis . -Continue metoprolol . -Hold diltiazem  until hemodynamics more stable.  # Critical Limb Ischemia (initially suspected, now resolved) Initially had cold, mottled legs with absent doppler signals. Likely worsened by pressor use and severe vasoconstriction during respiratory distress. Today pulses are  brisk AT/PT bilaterally, motor/sensory intact. No ischemic symptoms. Plan -Resume full diet. -Resume Eliquis  as primary anticoagulation. -Continue to monitor pulses q4h. -No need for CT angiogram unless new deficits occur. -Vascular surgery signed off.  # HFrecEF - now hyperdynamic EF (70-75%) History of HFimpEF. Echo this admission shows hyperdynamic ventricle, likely from recent stress, tachycardia, and prior vasopressor use. Appears mostly euvolemic with mild diuresis benefit. Plan -Continue cautious diuresis (Lasix  40 mg IV PRN). -Restart GDMT gradually. -Resume statin. -PRN hydralazine  for hypertension.  # AKI (improving) Likely due to hypotension, vasoconstriction, and brief pressor requirement. Creatinine improving from 1.65 ? 1.41. Plan -BMP daily. -Avoid nephrotoxins. -Maintain MAP > 65. -Resume GDMT slowly.  # CAD s/p PCI No active chest pain. Troponin negative. Hemodynamics stable. On Eliquis  for Afib. Plan Resume statin. Continue Eliquis  as anticoagulation strategy.  # Anxiety / Agitation Exacerbating respiratory distress and BiPAP intolerance. Needed Zyprexa  IM overnight and Atarax  earlier. Plan -Avoid benzodiazepines due to hypercapnia. -Use Atarax  once stable back on floor. -Behavioral reassurance; maintain BiPAP tolerance.  # Tobacco Use Disorder Major contributor to recurrent COPD exacerbations. Plan Continue nicotine  patch. Offer cessation counseling once stabilized.  #  Substance Use Disorder (methamphetamine) Likely contributing to anxiety, tachycardia, and difficulty complying with BiPAP. Plan Supportive care. Consider SBIRT or social work consult when stable.  Signature:  Sharnese Heath [facility]ad[facility]  1:55 PM, 03/10/2024  On Call pager [phone]

## 2024-03-11 DIAGNOSIS — J101 Influenza due to other identified influenza virus with other respiratory manifestations: Secondary | ICD-10-CM | POA: Diagnosis not present

## 2024-03-11 DIAGNOSIS — Z7952 Long term (current) use of systemic steroids: Secondary | ICD-10-CM | POA: Diagnosis not present

## 2024-03-11 DIAGNOSIS — N179 Acute kidney failure, unspecified: Secondary | ICD-10-CM | POA: Diagnosis not present

## 2024-03-11 DIAGNOSIS — I2510 Atherosclerotic heart disease of native coronary artery without angina pectoris: Secondary | ICD-10-CM | POA: Diagnosis not present

## 2024-03-11 DIAGNOSIS — I480 Paroxysmal atrial fibrillation: Secondary | ICD-10-CM | POA: Diagnosis not present

## 2024-03-11 DIAGNOSIS — F419 Anxiety disorder, unspecified: Secondary | ICD-10-CM | POA: Diagnosis not present

## 2024-03-11 DIAGNOSIS — I5020 Unspecified systolic (congestive) heart failure: Secondary | ICD-10-CM | POA: Diagnosis not present

## 2024-03-11 DIAGNOSIS — J441 Chronic obstructive pulmonary disease with (acute) exacerbation: Secondary | ICD-10-CM | POA: Diagnosis not present

## 2024-03-11 DIAGNOSIS — Z720 Tobacco use: Secondary | ICD-10-CM | POA: Diagnosis not present

## 2024-03-11 DIAGNOSIS — Z7901 Long term (current) use of anticoagulants: Secondary | ICD-10-CM | POA: Diagnosis not present

## 2024-03-11 LAB — BASIC METABOLIC PANEL WITH GFR
Anion gap: 7 (ref 5–15)
BUN: 23 mg/dL (ref 8–23)
CO2: 38 mmol/L — ABNORMAL HIGH (ref 22–32)
Calcium: 9.1 mg/dL (ref 8.9–10.3)
Chloride: 98 mmol/L (ref 98–111)
Creatinine, Ser: 0.94 mg/dL (ref 0.61–1.24)
GFR, Estimated: >60 mL/min (ref >60)
Glucose, Bld: 104 mg/dL — ABNORMAL HIGH (ref 70–99)
Potassium: 4.9 mmol/L (ref 3.5–5.1)
Sodium: 143 mmol/L (ref 135–145)

## 2024-03-11 LAB — CBC
HCT: 43.8 % (ref 39.0–52.0)
Hemoglobin: 13.8 g/dL (ref 13.0–17.0)
MCH: 31.7 pg (ref 26.0–34.0)
MCHC: 31.5 g/dL (ref 30.0–36.0)
MCV: 100.5 fL — ABNORMAL HIGH (ref 80.0–100.0)
Platelets: 172 K/uL (ref 150–400)
RBC: 4.36 MIL/uL (ref 4.22–5.81)
RDW: 13.9 % (ref 11.5–15.5)
WBC: 11.4 K/uL — ABNORMAL HIGH (ref 4.0–10.5)
nRBC: 0 % (ref 0.0–0.2)

## 2024-03-11 MED ORDER — LOSARTAN POTASSIUM 50 MG PO TABS
50.0000 mg | ORAL_TABLET | Freq: Every day | ORAL | Status: DC
  Administered 2024-03-11 – 2024-03-13 (×3): 50 mg via ORAL
  Filled 2024-03-11 (×3): qty 1

## 2024-03-11 MED ORDER — OSELTAMIVIR PHOSPHATE 75 MG PO CAPS
75.0000 mg | ORAL_CAPSULE | Freq: Two times a day (BID) | ORAL | Status: AC
  Administered 2024-03-11: 75 mg via ORAL
  Filled 2024-03-11: qty 1

## 2024-03-11 NOTE — Progress Notes (Signed)
 PHARMACY NOTE:  ANTIMICROBIAL RENAL DOSAGE ADJUSTMENT  Current antimicrobial regimen includes a mismatch between antimicrobial dosage and estimated renal function.  As per policy approved by the Pharmacy & Therapeutics and Medical Executive Committees, the antimicrobial dosage will be adjusted accordingly.  Current antimicrobial dosage:  Tamiflu  30 BID  Indication: Flu  Renal Function: Improved  Estimated Creatinine Clearance: 88.3 mL/min (by C-G formula based on SCr of 0.94 mg/dL). []      On intermittent HD, scheduled: []      On CRRT    Antimicrobial dosage has been changed to:  Tamiflu  75 BID - therapy ends after today's doses  Additional comments:   Thank you for allowing pharmacy to be a part of this patient's care.  Harlene Boga, PharmD, BCPS, BCCCP Please refer to [facility] for [facility] Pharmacy numbers 03/11/2024 2:28 PM

## 2024-03-11 NOTE — Progress Notes (Signed)
 Pt C/o back pain from previous sx. Pt stated lidocaine  patch and Tylenol  not working for pain. MD informed added scheduled tylenol  PRN Flexeril  And one time Oxy. As per MD explain to pt in taking Tylenol  and importance.  Pt refused tylenol  but accepted Flexeril 

## 2024-03-11 NOTE — Progress Notes (Addendum)
 HD#4 SUBJECTIVE:  Patient Summary: Francisco [name] is a 63 year old man with a PMH of severe COPD with recurrent exacerbations, acute on chronic hypoxic/hypercapnic respiratory failure, HFrecEF, tobacco use disorder, polysubstance use, paroxysmal atrial fibrillation on Eliquis , CAD s/p PCI, hypertension, who presented with worsening dyspnea, anxiety, and hypercarbic respiratory failure and was admitted for COPD exacerbation with influenza A and acute on chronic respiratory failure requiring BiPAP.  Overnight Events: No acute event.  Interim History:   Seen at bedside on 8 L supplemental oxygen  satting 98%, have weaned down to 5L, sats >92%.  He thinks he is doing better overall, no worsening cough.  OBJECTIVE:  Vital Signs: Vitals:   03/11/24 1121 03/11/24 1304 03/11/24 1320 03/11/24 1518  BP: (!) 145/80     Pulse: 75 85 74 78  Resp:  (!) 33 (!) 24 (!) 21  Temp: 98.5 F (36.9 C)     TempSrc: Oral     SpO2: (!) 89% 90% 91% 90%  Weight:      Height:       Supplemental O2: Nasal Cannula SpO2: 90 % O2 Flow Rate (L/min): 6 L/min FiO2 (%): 40 %  Filed Weights   03/07/24 2024 03/09/24 0325 03/10/24 0605  Weight: 93.4 kg 93.4 kg 90.5 kg     Intake/Output Summary (Last 24 hours) at 03/11/2024 1538 Last data filed at 03/11/2024 1133 Gross per 24 hour  Intake 240 ml  Output --  Net 240 ml   Net IO Since Admission: -2,329.95 mL [03/11/24 1538]  Physical Exam: Physical Exam General: Awake, mildly anxious, no acute distress. HEENT: MMM, no JVD. Lungs: Mild wheezing; work of breathing improved on [place]. Cardiac: RRR, no murmurs. Abdomen: Soft, NT, ND. Extremities: No edema. Bilateral DP/PT pulses dopplerable and brisk. Mottling significantly improved. Sensation and motor intact. Legs warm. Neuro: Alert, follows commands; no focal deficits. Patient Lines/Drains/Airways Status     Active Line/Drains/Airways     Name Placement date Placement time Site Days   Peripheral IV 03/07/24  18 G Left Antecubital 03/07/24  0917  Antecubital  3   Peripheral IV 03/07/24 20 G Anterior;Distal;Right;Upper Arm 03/07/24  0930  Arm  3   External Urinary Catheter 03/08/24  1100  --  2            Pertinent labs and imaging:      Latest Ref Rng & Units 03/11/2024    4:35 AM 03/10/2024    3:00 AM 03/09/2024    2:54 AM  CBC  WBC 4.0 - 10.5 K/uL 11.4  12.0  11.3   Hemoglobin 13.0 - 17.0 g/dL 86.1  86.5  86.8   Hematocrit 39.0 - 52.0 % 43.8  41.5  41.7   Platelets 150 - 400 K/uL 172  198  175        Latest Ref Rng & Units 03/11/2024    4:35 AM 03/10/2024    3:00 AM 03/09/2024    2:57 PM  CMP  Glucose 70 - 99 mg/dL 895  878  841   BUN 8 - 23 mg/dL 23  41  40   Creatinine 0.61 - 1.24 mg/dL 9.05  8.58  8.34   Sodium 135 - 145 mmol/L 143  143  141   Potassium 3.5 - 5.1 mmol/L 4.9  4.6  4.6   Chloride 98 - 111 mmol/L 98  98  93   CO2 22 - 32 mmol/L 38  34  38   Calcium  8.9 - 10.3 mg/dL 9.1  8.7  8.9     No results found.  ASSESSMENT/PLAN:  Assessment: Active Problems:   COPD with acute exacerbation (HCC)   Acute on chronic respiratory failure with hypoxia and hypercapnia (HCC)   Influenza A   Chronic atrial fibrillation (HCC)   Critical limb ischemia of left lower extremity (HCC)   Dependence on non-invasive ventilation (HCC)   Heart failure with recovered ejection fraction (HFrecEF) (HCC)   Plan: Francisco [name] is a 63 year old man with a PMH of severe COPD with recurrent exacerbations, acute on chronic hypoxic/hypercapnic respiratory failure, HFrecEF, tobacco use disorder, polysubstance use, paroxysmal atrial fibrillation on Eliquis , CAD s/p PCI, hypertension, who presented with worsening dyspnea, anxiety, and hypercarbic respiratory failure and was admitted for COPD exacerbation with influenza A and acute on chronic respiratory failure requiring BiPAP.  # Acute on Chronic Hypercapnic/Hypoxic Respiratory Failure # COPD Exacerbation (severe, recurrent) # Influenza  A Overall respiratory status improved, but not medically stable for discharge, as his saturation dropped with ambulation.  Plan -Intermittent BiPAP; [place] between sessions. -Continue Tamiflu ; discontinue CTX/azithro (no bacterial pneumonia). -Continue scheduled nebulizers and steroids as outlined. -Consider long-term need for home BiPAP once stable. -Pulmonary toilet. -Continue DuoNeb q4h, Pulmicort  BID. -After stabilization: consider single triple therapy inhaler. -Continue IV methylprednisolone  40 mg BID (5-day course). -Smoking cessation support.  # Hypertension BP elevated, he is on metoprolol  25 mg twice daily.  Per dispense history, he is supposed to be on losartan  50 mg, will restart for better blood pressure control. - Start losartan  as above.  # Paroxysmal Atrial Fibrillation Stable, normal rate, AF on Eliquis . Plan -Continue metoprolol . -Hold diltiazem  until hemodynamics more stable.  # Critical Limb Ischemia (initially suspected, now resolved) Improved, brisk pulses. Plan - On Eliquis . -Continue to monitor pulses q4h. -No need for CT angiogram unless new deficits occur. -Vascular surgery signed off.  # HFrecEF - now hyperdynamic EF (70-75%) History of HFimpEF. Echo this admission shows hyperdynamic ventricle, likely from recent stress, tachycardia, and prior vasopressor use. Appears mostly euvolemic with mild diuresis benefit. Plan -Continue cautious diuresis (Lasix  40 mg IV PRN). -Restart GDMT gradually. -Resume statin. -PRN hydralazine  for hypertension.  # AKI (improving) AKI has resolved. Plan: -BMP as above. -Resume GDMT slowly.  # CAD s/p PCI No active chest pain. Troponin negative. Hemodynamics stable. On Eliquis  for Afib. Plan Resume statin. Continue Eliquis  as anticoagulation strategy.  # Anxiety / Agitation Exacerbating respiratory distress and BiPAP intolerance. Needed Zyprexa  IM overnight and Atarax  earlier. Plan -Avoid benzodiazepines due to  hypercapnia. -Use Atarax  once stable back on floor. -Behavioral reassurance; maintain BiPAP tolerance.  # Tobacco Use Disorder Major contributor to recurrent COPD exacerbations.  Has previously tried Chantix, unable to tolerate due to vivid dreams. Plan Continue nicotine  patch. Offer cessation counseling once stabilized.  # Substance Use Disorder (methamphetamine) Likely contributing to anxiety, tachycardia, and difficulty complying with BiPAP. Plan Supportive care. Consider SBIRT or social work consult when stable.  Signature:  Missy Celestina [facility]  3:38 PM, 03/11/2024  On Call pager [phone]

## 2024-03-12 ENCOUNTER — Ambulatory Visit: Admitting: Student

## 2024-03-12 DIAGNOSIS — J441 Chronic obstructive pulmonary disease with (acute) exacerbation: Secondary | ICD-10-CM | POA: Diagnosis not present

## 2024-03-12 DIAGNOSIS — J9622 Acute and chronic respiratory failure with hypercapnia: Secondary | ICD-10-CM | POA: Diagnosis not present

## 2024-03-12 DIAGNOSIS — I2510 Atherosclerotic heart disease of native coronary artery without angina pectoris: Secondary | ICD-10-CM | POA: Diagnosis not present

## 2024-03-12 DIAGNOSIS — I5020 Unspecified systolic (congestive) heart failure: Secondary | ICD-10-CM | POA: Diagnosis not present

## 2024-03-12 DIAGNOSIS — I110 Hypertensive heart disease with heart failure: Secondary | ICD-10-CM | POA: Diagnosis not present

## 2024-03-12 DIAGNOSIS — F419 Anxiety disorder, unspecified: Secondary | ICD-10-CM | POA: Diagnosis not present

## 2024-03-12 DIAGNOSIS — J9621 Acute and chronic respiratory failure with hypoxia: Secondary | ICD-10-CM | POA: Diagnosis not present

## 2024-03-12 DIAGNOSIS — N179 Acute kidney failure, unspecified: Secondary | ICD-10-CM | POA: Diagnosis not present

## 2024-03-12 DIAGNOSIS — F1910 Other psychoactive substance abuse, uncomplicated: Secondary | ICD-10-CM

## 2024-03-12 DIAGNOSIS — I480 Paroxysmal atrial fibrillation: Secondary | ICD-10-CM | POA: Diagnosis not present

## 2024-03-12 DIAGNOSIS — I70209 Unspecified atherosclerosis of native arteries of extremities, unspecified extremity: Secondary | ICD-10-CM | POA: Diagnosis not present

## 2024-03-12 DIAGNOSIS — J101 Influenza due to other identified influenza virus with other respiratory manifestations: Secondary | ICD-10-CM | POA: Diagnosis not present

## 2024-03-12 LAB — BASIC METABOLIC PANEL WITH GFR
Anion gap: 9 (ref 5–15)
BUN: 21 mg/dL (ref 8–23)
CO2: 38 mmol/L — ABNORMAL HIGH (ref 22–32)
Calcium: 8.9 mg/dL (ref 8.9–10.3)
Chloride: 90 mmol/L — ABNORMAL LOW (ref 98–111)
Creatinine, Ser: 0.8 mg/dL (ref 0.61–1.24)
GFR, Estimated: >60 mL/min (ref >60)
Glucose, Bld: 101 mg/dL — ABNORMAL HIGH (ref 70–99)
Potassium: 4.6 mmol/L (ref 3.5–5.1)
Sodium: 137 mmol/L (ref 135–145)

## 2024-03-12 LAB — CULTURE, BLOOD (ROUTINE X 2)
Culture: NO GROWTH
Culture: NO GROWTH
Special Requests: ADEQUATE
Special Requests: ADEQUATE

## 2024-03-12 MED ORDER — METHOCARBAMOL 500 MG PO TABS
500.0000 mg | ORAL_TABLET | Freq: Three times a day (TID) | ORAL | Status: DC
  Administered 2024-03-12 – 2024-03-14 (×8): 500 mg via ORAL
  Filled 2024-03-12 (×8): qty 1

## 2024-03-12 MED ORDER — BUDESON-GLYCOPYRROL-FORMOTEROL 160-9-4.8 MCG/ACT IN AERO
2.0000 | INHALATION_SPRAY | Freq: Two times a day (BID) | RESPIRATORY_TRACT | Status: DC
  Administered 2024-03-12 – 2024-03-14 (×4): 2 via RESPIRATORY_TRACT
  Filled 2024-03-12: qty 5.9

## 2024-03-12 MED ORDER — FUROSEMIDE 10 MG/ML IJ SOLN
40.0000 mg | Freq: Once | INTRAMUSCULAR | Status: AC
  Administered 2024-03-12: 40 mg via INTRAVENOUS
  Filled 2024-03-12: qty 4

## 2024-03-12 NOTE — TOC CM/SW Note (Signed)
 Follow up done with [employer] liaison for home BIPAP- per previous CM note referral for home BIPAP was sent on 11/17- pt's insurance has up to 14 days to authorize DME.   Spoke with [employer] liaison Mitch- per Thomasina they still have not received auth back from insurance. Portal is checked daily so once approved they can get pt scheduled for delivery and education.   CM will continue to follow.

## 2024-03-12 NOTE — Progress Notes (Addendum)
 HD#5 SUBJECTIVE:  Patient Summary:Francisco [name] is a 63 year old man with a PMH of severe COPD with recurrent exacerbations, acute on chronic hypoxic/hypercapnic respiratory failure, HFrecEF, tobacco use disorder, polysubstance use, paroxysmal atrial fibrillation on Eliquis , CAD s/p PCI, hypertension, who presented with worsening dyspnea, anxiety, and hypercarbic respiratory failure and was admitted for COPD exacerbation with influenza A and acute on chronic respiratory failure requiring BiPAP.   Overnight Events: NAEON  Interim History:  Patient was sitting on the bed and was wheezing with audible wheezing sounds and tachypnea. He denied chest pain. He also reported mild lower-extremity swelling but denied pain or paresthesia.  Explained that he needs BiPAP therapy and an ambulatory test prior to discharge. Called his roommate, who stated that he has not received any updates yet. OBJECTIVE:  Vital Signs: Vitals:   03/11/24 1950 03/11/24 2320 03/12/24 0210 03/12/24 0500  BP: (!) 157/78 (!) 160/84 127/73   Pulse:  66 79   Resp:  15 (!) 30 16  Temp: 97.8 F (36.6 C) 97.9 F (36.6 C) (!) 97.4 F (36.3 C)   TempSrc: Oral Axillary Axillary   SpO2:  93% 94%   Weight:      Height:       Supplemental O2: Room Air SpO2: 94 % O2 Flow Rate (L/min): 6 L/min FiO2 (%): 40 %  Filed Weights   03/07/24 2024 03/09/24 0325 03/10/24 0605  Weight: 93.4 kg 93.4 kg 90.5 kg     Intake/Output Summary (Last 24 hours) at 03/12/2024 0704 Last data filed at 03/12/2024 9786 Gross per 24 hour  Intake 240 ml  Output 1400 ml  Net -1160 ml   Net IO Since Admission: -3,729.95 mL [03/12/24 0704]  Physical Exam: Physical Exam General: Awake, mildly anxious, no acute distress. HEENT: MMM, no JVD. Lungs: Mild wheezing; Tachypnea, pursed-lip breathing. work of breathing improved on [place]. Cardiac: RRR, no murmurs. Abdomen: Soft, NT, ND. Extremities: No edema. Bilateral DP/PT pulses dopplerable and brisk.  Mottling significantly improved. Sensation and motor intact. Legs warm. Neuro: Alert, follows commands; no focal deficits.   Patient Lines/Drains/Airways Status     Active Line/Drains/Airways     Name Placement date Placement time Site Days   Peripheral IV 03/07/24 18 G Left Antecubital 03/07/24  0917  Antecubital  5   Peripheral IV 03/07/24 20 G Anterior;Distal;Right;Upper Arm 03/07/24  0930  Arm  5   External Urinary Catheter 03/08/24  1100  --  4            Pertinent labs and imaging:     Latest Ref Rng & Units 03/11/2024    4:35 AM 03/10/2024    3:00 AM 03/09/2024    2:54 AM  CBC  WBC 4.0 - 10.5 K/uL 11.4  12.0  11.3   Hemoglobin 13.0 - 17.0 g/dL 86.1  86.5  86.8   Hematocrit 39.0 - 52.0 % 43.8  41.5  41.7   Platelets 150 - 400 K/uL 172  198  175        Latest Ref Rng & Units 03/11/2024    4:35 AM 03/10/2024    3:00 AM 03/09/2024    2:57 PM  CMP  Glucose 70 - 99 mg/dL 895  878  841   BUN 8 - 23 mg/dL 23  41  40   Creatinine 0.61 - 1.24 mg/dL 9.05  8.58  8.34   Sodium 135 - 145 mmol/L 143  143  141   Potassium 3.5 - 5.1 mmol/L 4.9  4.6  4.6   Chloride 98 - 111 mmol/L 98  98  93   CO2 22 - 32 mmol/L 38  34  38   Calcium  8.9 - 10.3 mg/dL 9.1  8.7  8.9     No results found.  ASSESSMENT/PLAN:  Assessment: Active Problems:   COPD with acute exacerbation (HCC)   Acute on chronic respiratory failure with hypoxia and hypercapnia (HCC)   Influenza A   Chronic atrial fibrillation (HCC)   Critical limb ischemia of left lower extremity (HCC)   Dependence on non-invasive ventilation (HCC)   Heart failure with recovered ejection fraction (HFrecEF) (HCC)   Plan: Francisco [name] is a 63 year old man with a PMH of severe COPD with recurrent exacerbations, acute on chronic hypoxic/hypercapnic respiratory failure, HFrecEF, tobacco use disorder, polysubstance use, paroxysmal atrial fibrillation on Eliquis , CAD s/p PCI, hypertension, who presented with worsening dyspnea,  anxiety, and hypercarbic respiratory failure and was admitted for COPD exacerbation with influenza A and acute on chronic respiratory failure requiring BiPAP.   # Acute on Chronic Hypercapnic/Hypoxic Respiratory Failure # COPD Exacerbation (severe, recurrent) # Influenza A Overall respiratory status improved, but not medically stable for discharge, as his saturation dropped with ambulation. His oxygen  sat went down to 87% while in bed so we had to up it 10L for several minutes, it's back down to 4L now sustaining 94% Spo2. So he wants to wait a bit before getting up.   Plan -Intermittent BiPAP; [place] between sessions. -DC Tamiflu  -Continue scheduled nebulizers and steroids as outlined. -Consider long-term need for home BiPAP once stable. -Pulmonary toilet. -Continue DuoNeb q4h, Pulmicort  BID. -After stabilization: consider single triple therapy inhaler. -Continue IV methylprednisolone  40 mg BID . -Smoking cessation support.   # Hypertension BP elevated, he is on metoprolol  25 mg twice daily.  Per dispense history, he is supposed to be on losartan  50 mg, will restart for better blood pressure control. - Start losartan  as above.  # Paroxysmal Atrial Fibrillation Stable, normal rate, AF on Eliquis . Plan -Continue metoprolol . - Continue diltiazem   # Critical Limb Ischemia (initially suspected, now resolved) Improved, brisk pulses. Plan - On Eliquis . -Continue to monitor pulses q4h. -No need for CT angiogram unless new deficits occur. -Vascular surgery signed off.  # HFrecEF - now hyperdynamic EF (70-75%) History of HFimpEF. Echo this admission shows hyperdynamic ventricle, likely from recent stress, tachycardia, and prior vasopressor use. Appears mostly euvolemic with mild diuresis benefit. Plan -Continue cautious diuresis (Lasix  40 mg IV PRN). -Restart GDMT gradually. -Resume statin. -PRN hydralazine  for hypertension.  # AKI (improving) AKI has resolved. Plan: -BMP as  above. -Resume GDMT slowly.  # CAD s/p PCI No active chest pain. Troponin negative. Hemodynamics stable. On Eliquis  for Afib. Plan Resume statin. Continue Eliquis  as anticoagulation strategy.  # Anxiety / Agitation Exacerbating respiratory distress and BiPAP intolerance. Needed Zyprexa  IM overnight and Atarax  earlier. Plan -Avoid benzodiazepines due to hypercapnia. -Use Atarax  once stable back on floor. -Behavioral reassurance; maintain BiPAP tolerance.  # Tobacco Use Disorder Major contributor to recurrent COPD exacerbations.  Has previously tried Chantix, unable to tolerate due to vivid dreams. Plan Continue nicotine  patch. Offer cessation counseling once stabilized.  # Substance Use Disorder (methamphetamine) Likely contributing to anxiety, tachycardia, and difficulty complying with BiPAP. Plan Supportive care. Consider SBIRT or social work consult when stable.  Signature:  Alyra Patty Bernadine [facility]  7:04 AM, 03/12/2024  On Call pager [phone]

## 2024-03-12 NOTE — Evaluation (Signed)
 Physical Therapy Evaluation and Discharge Patient Details Name: Francisco [name] MRN: [id number] DOB: 07-10-1960 Today's Date: 03/12/2024  History of Present Illness  Patient is a 63 yo male who presented 03/07/24 (after discharge 11/17) with SOB. Admitted with COPD exacerbation. Pt with multiple recent ED visits/hospitalizations for the same over the past month. PMH includes CHF, COPD, CAD s/p PCI, HTN, MI, Hep-C, tobacco use, IVDA (previously heroin, methamphetamine).  Clinical Impression   Patient recently evaluated by Physical Therapy (03/05/24 during last admission) and reviewed information again today. [name] oxygen  saturation >94% throughout session, however his work of breathing is increased with ?wheezing on exhale with bed to chair transfer and ambulation deferred. (Walked into hallway with nursing earlier today with RN reporting sats down to 78%--not sure how many liters he was on). Patient with no further acute PT needs identified. All education has been completed and the patient has no further questions. If he had transportation, Pulmonary Rehab might be beneficial. PT is signing off. Thank you for this referral.         If plan is discharge home, recommend the following: Assist for transportation;Assistance with cooking/housework;Help with stairs or ramp for entrance   Can travel by private vehicle        Equipment Recommendations None recommended by PT  Recommendations for Other Services  OT consult;Other (comment)    Functional Status Assessment Patient has had a recent decline in their functional status and/or demonstrates limited ability to make significant improvements in function in a reasonable and predictable amount of time     Precautions / Restrictions Precautions Precautions: Other (comment) Precaution/Restrictions Comments: watch O2, on 2L baseline Restrictions Weight Bearing Restrictions Per Provider Order: No      Mobility  Bed Mobility Overal bed  mobility: Modified Independent             General bed mobility comments: HOB elevated, no assistance needed    Transfers Overall transfer level: Independent Equipment used: None               General transfer comment: No LOB, no assistance needed    Ambulation/Gait               General Gait Details: pt with incr WOB and did not look appropriate to do more than transfer; pt in agreement (although he wants to do all he can). Per RN walked short distance into hallway earlier today with sats down to 78% (not sure how many liters O2)  Stairs            Wheelchair Mobility     Tilt Bed    Modified Rankin (Stroke Patients Only)       Balance Overall balance assessment: No apparent balance deficits (not formally assessed)                                           Pertinent Vitals/Pain Pain Assessment Pain Assessment: No/denies pain    Home Living Family/patient expects to be discharged to:: Private residence Living Arrangements: Non-relatives/Friends Available Help at Discharge: Friend(s);Available PRN/intermittently Type of Home: House Home Access: Stairs to enter Entrance Stairs-Rails: Right;Left;Can reach both Entrance Stairs-Number of Steps: 6   Home Layout: One level Home Equipment: Other (comment);Shower seat (home O2; shower chair does not fit his tub/shower)      Prior Function Prior Level of Function : Independent/Modified Independent  Mobility Comments: No AD, does not leave home much, limited household mobility due to endurance/pulmonary deficits; states he was able to get up 6 steps when recently discharged--reminded to go slow and rest as needed ADLs Comments: having issues being able to stand long enough to shower lately, but is independent; does not drive     Extremity/Trunk Assessment   Upper Extremity Assessment Upper Extremity Assessment: Overall WFL for tasks assessed    Lower Extremity  Assessment Lower Extremity Assessment: Overall WFL for tasks assessed    Cervical / Trunk Assessment Cervical / Trunk Assessment: Normal  Communication   Communication Communication: No apparent difficulties    Cognition Arousal: Alert Behavior During Therapy: WFL for tasks assessed/performed   PT - Cognitive impairments: No apparent impairments                         Following commands: Intact       Cueing Cueing Techniques: Verbal cues     General Comments General comments (skin integrity, edema, etc.): initially on 10L 96%, pt requesting turn down as it is burning his nose; gradually decr throughout session and by end on 5L 94% up in chair    Exercises Other Exercises Other Exercises: Attempted to get pt to use IS and he adamantly refused It makes me cough which makes my back hurt. Attempted to explain purpose of IS and pt stating he's not going to do it. Other Exercises: discussed not rushing and trying to keep breathing slow and even when mobilizing. He admits he starts to feel SOB and rushes to get where he is going.   Assessment/Plan    PT Assessment Patient does not need any further PT services  PT Problem List         PT Treatment Interventions      PT Goals (Current goals can be found in the Care Plan section)  Acute Rehab PT Goals Patient Stated Goal: to reduce hospitalizations and improve his breathing PT Goal Formulation: All assessment and education complete, DC therapy    Frequency       Co-evaluation               AM-PAC PT 6 Clicks Mobility  Outcome Measure Help needed turning from your back to your side while in a flat bed without using bedrails?: None Help needed moving from lying on your back to sitting on the side of a flat bed without using bedrails?: None Help needed moving to and from a bed to a chair (including a wheelchair)?: None Help needed standing up from a chair using your arms (e.g., wheelchair or bedside  chair)?: None Help needed to walk in hospital room?: None Help needed climbing 3-5 steps with a railing? : None 6 Click Score: 24    End of Session Equipment Utilized During Treatment: Oxygen  Activity Tolerance: Treatment limited secondary to medical complications (Comment) (WOB) Patient left: with call bell/phone within reach;in chair Nurse Communication: Mobility status;Other (comment) (pt requested turn down O2; documented in flowsheet) PT Visit Diagnosis: Difficulty in walking, not elsewhere classified (R26.2);Other (comment) (endurance deficits)    Time: 8472-8447 PT Time Calculation (min) (ACUTE ONLY): 25 min   Charges:   PT Evaluation $PT Eval Low Complexity: 1 Low PT Treatments $Self Care/Home Management: 8-22 PT General Charges $$ ACUTE PT VISIT: 1 Visit          Francisco [name], PT Acute Rehabilitation Services  Office [phone]   Francisco [name] 03/12/2024,  4:10 PM

## 2024-03-12 NOTE — Progress Notes (Signed)
   03/12/24 0807  BiPAP/CPAP/SIPAP  Reason BIPAP/CPAP not in use Other(comment) (standby at this time.)  BiPAP/CPAP /SiPAP Vitals  Bilateral Breath Sounds Diminished;[place]

## 2024-03-12 NOTE — Progress Notes (Signed)
 This RN observed dark pigmentation on patient's BL knees, resembling mottling. Patient reports increased SOB, restlessness, and anxiety during ambulation, with dependence on oxygen  that fluctuates. IMTS notified with no new orders.

## 2024-03-12 NOTE — Progress Notes (Signed)
 PT Cancellation Note  Patient Details Name: Francisco [name] MRN: [id number] DOB: 04-16-1961   Cancelled Treatment:    Reason Eval/Treat Not Completed: Patient not medically ready  Noted currently on BiPAP due to desaturating this a.m. Will reattempt later today as appropriate.    Francisco RAMAN, PT Acute Rehabilitation Services  Office [phone]  Francisco [name] 03/12/2024, 7:43 AM

## 2024-03-13 ENCOUNTER — Other Ambulatory Visit (HOSPITAL_COMMUNITY): Payer: Self-pay

## 2024-03-13 ENCOUNTER — Inpatient Hospital Stay (HOSPITAL_COMMUNITY)

## 2024-03-13 DIAGNOSIS — J101 Influenza due to other identified influenza virus with other respiratory manifestations: Secondary | ICD-10-CM | POA: Diagnosis not present

## 2024-03-13 DIAGNOSIS — J449 Chronic obstructive pulmonary disease, unspecified: Secondary | ICD-10-CM

## 2024-03-13 DIAGNOSIS — Z7189 Other specified counseling: Secondary | ICD-10-CM

## 2024-03-13 DIAGNOSIS — J9622 Acute and chronic respiratory failure with hypercapnia: Secondary | ICD-10-CM | POA: Diagnosis not present

## 2024-03-13 DIAGNOSIS — Z7901 Long term (current) use of anticoagulants: Secondary | ICD-10-CM | POA: Diagnosis not present

## 2024-03-13 DIAGNOSIS — Z9981 Dependence on supplemental oxygen: Secondary | ICD-10-CM | POA: Diagnosis not present

## 2024-03-13 DIAGNOSIS — J9621 Acute and chronic respiratory failure with hypoxia: Secondary | ICD-10-CM | POA: Diagnosis not present

## 2024-03-13 DIAGNOSIS — I70202 Unspecified atherosclerosis of native arteries of extremities, left leg: Secondary | ICD-10-CM | POA: Diagnosis not present

## 2024-03-13 DIAGNOSIS — F1590 Other stimulant use, unspecified, uncomplicated: Secondary | ICD-10-CM

## 2024-03-13 DIAGNOSIS — I480 Paroxysmal atrial fibrillation: Secondary | ICD-10-CM | POA: Diagnosis not present

## 2024-03-13 DIAGNOSIS — I10 Essential (primary) hypertension: Secondary | ICD-10-CM | POA: Diagnosis not present

## 2024-03-13 DIAGNOSIS — J9690 Respiratory failure, unspecified, unspecified whether with hypoxia or hypercapnia: Secondary | ICD-10-CM | POA: Diagnosis not present

## 2024-03-13 DIAGNOSIS — R918 Other nonspecific abnormal finding of lung field: Secondary | ICD-10-CM | POA: Diagnosis not present

## 2024-03-13 DIAGNOSIS — F17200 Nicotine dependence, unspecified, uncomplicated: Secondary | ICD-10-CM

## 2024-03-13 LAB — CBC
HCT: 44.4 % (ref 39.0–52.0)
Hemoglobin: 14.1 g/dL (ref 13.0–17.0)
MCH: 31.3 pg (ref 26.0–34.0)
MCHC: 31.8 g/dL (ref 30.0–36.0)
MCV: 98.7 fL (ref 80.0–100.0)
Platelets: 183 K/uL (ref 150–400)
RBC: 4.5 MIL/uL (ref 4.22–5.81)
RDW: 13.5 % (ref 11.5–15.5)
WBC: 10.2 K/uL (ref 4.0–10.5)
nRBC: 0 % (ref 0.0–0.2)

## 2024-03-13 LAB — BASIC METABOLIC PANEL WITH GFR
Anion gap: 9 (ref 5–15)
BUN: 18 mg/dL (ref 8–23)
CO2: 41 mmol/L — ABNORMAL HIGH (ref 22–32)
Calcium: 9.4 mg/dL (ref 8.9–10.3)
Chloride: 91 mmol/L — ABNORMAL LOW (ref 98–111)
Creatinine, Ser: 0.97 mg/dL (ref 0.61–1.24)
GFR, Estimated: >60 mL/min (ref >60)
Glucose, Bld: 145 mg/dL — ABNORMAL HIGH (ref 70–99)
Potassium: 4.2 mmol/L (ref 3.5–5.1)
Sodium: 141 mmol/L (ref 135–145)

## 2024-03-13 LAB — BRAIN NATRIURETIC PEPTIDE: B Natriuretic Peptide: 56.4 pg/mL (ref 0.0–100.0)

## 2024-03-13 MED ORDER — POTASSIUM CHLORIDE CRYS ER 20 MEQ PO TBCR
30.0000 meq | EXTENDED_RELEASE_TABLET | Freq: Once | ORAL | Status: AC
  Administered 2024-03-13: 30 meq via ORAL
  Filled 2024-03-13: qty 1

## 2024-03-13 MED ORDER — BUPROPION HCL ER (SR) 150 MG PO TB12
150.0000 mg | ORAL_TABLET | Freq: Two times a day (BID) | ORAL | Status: DC
  Administered 2024-03-13 – 2024-03-14 (×3): 150 mg via ORAL
  Filled 2024-03-13 (×4): qty 1

## 2024-03-13 MED ORDER — FUROSEMIDE 10 MG/ML IJ SOLN
40.0000 mg | Freq: Once | INTRAMUSCULAR | Status: AC
  Administered 2024-03-13: 40 mg via INTRAVENOUS
  Filled 2024-03-13: qty 4

## 2024-03-13 MED ORDER — FUROSEMIDE 10 MG/ML IJ SOLN: 40.0000 mg | Freq: Once | INTRAMUSCULAR | Status: DC

## 2024-03-13 NOTE — Progress Notes (Signed)
 HD#6 SUBJECTIVE:  Patient Summary:Francisco [name] is a 63 year old man with a PMH of severe COPD with recurrent exacerbations, acute on chronic hypoxic/hypercapnic respiratory failure, HFrecEF, tobacco use disorder, polysubstance use, paroxysmal atrial fibrillation on Eliquis , CAD s/p PCI, hypertension, who presented with worsening dyspnea, anxiety, and hypercarbic respiratory failure and was admitted for COPD exacerbation with influenza A and acute on chronic respiratory failure requiring BiPAP.   Overnight Events: NAEON  Interim History: Patient was sleeping in the bed without any acute distress, had nasal cannula on 3 lit oxygen .  Denied chest pain, new shortness of breath and anxiety episode.  He was complaining of back pain and asking for pain medication.  OBJECTIVE:  Vital Signs: Vitals:   03/13/24 0149 03/13/24 0355 03/13/24 0702 03/13/24 1213  BP: (!) 150/83 130/68  (!) 159/92  Pulse: 81 81  92  Resp: (!) 22 20  18   Temp: 97.6 F (36.4 C) 97.7 F (36.5 C)  97.7 F (36.5 C)  TempSrc: Oral Oral  Oral  SpO2: 93% 92%  90%  Weight:   92.8 kg   Height:       Supplemental O2: Room Air SpO2: 90 % O2 Flow Rate (L/min): 3 L/min FiO2 (%): 40 %  Filed Weights   03/09/24 0325 03/10/24 0605 03/13/24 0702  Weight: 93.4 kg 90.5 kg 92.8 kg     Intake/Output Summary (Last 24 hours) at 03/13/2024 1405 Last data filed at 03/13/2024 9261 Gross per 24 hour  Intake --  Output 2250 ml  Net -2250 ml   Net IO Since Admission: -5,979.95 mL [03/13/24 1405]  Physical Exam: Physical Exam General: Awake, mildly anxious, no acute distress. HEENT: MMM, no JVD. Lungs: Mild wheezing; Tachypnea, pursed-lip breathing. work of breathing improved on Delta. Cardiac: RRR, no murmurs. Abdomen: Soft, NT, ND. Extremities: No edema. Bilateral DP/PT pulses dopplerable and brisk. Mottling significantly improved. Sensation and motor intact. Legs warm. Neuro: Alert, follows commands; no focal deficits.   Patient Lines/Drains/Airways Status     Active Line/Drains/Airways     Name Placement date Placement time Site Days   Peripheral IV 03/07/24 20 G Anterior;Distal;Right;Upper Arm 03/07/24  0930  Arm  6   External Urinary Catheter 03/08/24  1100  --  5            Pertinent labs and imaging:      Latest Ref Rng & Units 03/13/2024    8:26 AM 03/11/2024    4:35 AM 03/10/2024    3:00 AM  CBC  WBC 4.0 - 10.5 K/uL 10.2  11.4  12.0   Hemoglobin 13.0 - 17.0 g/dL 85.8  86.1  86.5   Hematocrit 39.0 - 52.0 % 44.4  43.8  41.5   Platelets 150 - 400 K/uL 183  172  198        Latest Ref Rng & Units 03/13/2024    8:26 AM 03/12/2024    7:39 AM 03/11/2024    4:35 AM  CMP  Glucose 70 - 99 mg/dL 854  898  895   BUN 8 - 23 mg/dL 18  21  23    Creatinine 0.61 - 1.24 mg/dL 9.02  9.19  9.05   Sodium 135 - 145 mmol/L 141  137  143   Potassium 3.5 - 5.1 mmol/L 4.2  4.6  4.9   Chloride 98 - 111 mmol/L 91  90  98   CO2 22 - 32 mmol/L 41  38  38   Calcium  8.9 - 10.3 mg/dL 9.4  8.9  9.1     No results found.  ASSESSMENT/PLAN:  Assessment: Active Problems:   COPD with acute exacerbation (HCC)   Acute on chronic respiratory failure with hypoxia and hypercapnia (HCC)   Influenza A   Chronic atrial fibrillation (HCC)   Critical limb ischemia of left lower extremity (HCC)   Dependence on non-invasive ventilation (HCC)   Heart failure with recovered ejection fraction (HFrecEF) (HCC)   Plan: Francisco [name] is a 63 year old man with a PMH of severe COPD with recurrent exacerbations, acute on chronic hypoxic/hypercapnic respiratory failure, HFrecEF, tobacco use disorder, polysubstance use, paroxysmal atrial fibrillation on Eliquis , CAD s/p PCI, hypertension, who presented with worsening dyspnea, anxiety, and hypercarbic respiratory failure and was admitted for COPD exacerbation with influenza A and acute on chronic respiratory failure requiring BiPAP   # Acute on Chronic Hypercapnic/Hypoxic  Respiratory Failure # COPD Exacerbation (severe, recurrent) # Influenza A 63 year old man with severe COPD (FEV1 23%), home O? 2 L, HFimpEF, CAD, Afib, and tobacco use was admitted on 11/19 with COPD exacerbation and Influenza A after a recent discharge. He developed acute hypercapnic respiratory failure on 11/20 (ABG 7.22/105) requiring BiPAP and ICU transfer, then downgraded on 11/22.  Since then, he continues to have marked exertional hypoxia, dropping to the 60-80% range with minimal walking, despite resting O? needs of 3-5 L. He intermittently needs NIPPV/HFNC and shows significant work of breathing. Pulm feels this may represent his new baseline, likely requiring higher long-term O? (?6 L exertional) and home volume-targeted NIPPV instead of BiPAP. Not safe for discharge due to ongoing desaturations.  PCCm: Plan: trial IV Lasix , check BNP, add PPI, smoking cessation, continue inhalers, arrange NIPPV evaluation, consider AM ABG.  Plan -Intermittent BiPAP; [place] between sessions. -Continue Tamiflu ; discontinue CTX/azithro (no bacterial pneumonia). -Continue scheduled nebulizers and steroids as outlined. -Pulmonary toilet. -Continue DuoNeb q4h, Pulmicort  BID. -After stabilization: consider single triple therapy inhaler. -Continue IV methylprednisolone  40 mg BID (5-day course). -Smoking cessation support.   # Hypertension BP elevated, he is on metoprolol  25 mg twice daily.  Per dispense history, he is supposed to be on losartan  50 mg, will restart for better blood pressure control. - Start losartan  as above.  # Paroxysmal Atrial Fibrillation Stable, normal rate, AF on Eliquis . Plan -Continue metoprolol . -Hold diltiazem  until hemodynamics more stable.  # Critical Limb Ischemia (initially suspected, now resolved) Improved, brisk pulses. Plan - On Eliquis . -Continue to monitor pulses q4h. -No need for CT angiogram unless new deficits occur. -Vascular surgery signed off.  # HFrecEF  - now hyperdynamic EF (70-75%) History of HFimpEF. Echo this admission shows hyperdynamic ventricle, likely from recent stress, tachycardia, and prior vasopressor use. Appears mostly euvolemic with mild diuresis benefit. Plan -Continue cautious diuresis (Lasix  40 mg IV PRN). -Restart GDMT gradually. -Resume statin. -PRN hydralazine  for hypertension.  # AKI (improving) AKI has resolved. Plan: -BMP as above. -Resume GDMT slowly.  # CAD s/p PCI No active chest pain. Troponin negative. Hemodynamics stable. On Eliquis  for Afib. Plan Resume statin. Continue Eliquis  as anticoagulation strategy.  # Anxiety / Agitation Exacerbating respiratory distress and BiPAP intolerance. Needed Zyprexa  IM overnight and Atarax  earlier. Plan -Avoid benzodiazepines due to hypercapnia. -Use Atarax  once stable back on floor. -Behavioral reassurance; maintain BiPAP tolerance.  # Tobacco Use Disorder Major contributor to recurrent COPD exacerbations.  Has previously tried Chantix, unable to tolerate due to vivid dreams. Plan Continue nicotine  patch. Offer cessation counseling once stabilized.  # Substance Use Disorder (methamphetamine) Likely contributing to anxiety,  tachycardia, and difficulty complying with BiPAP. Plan Supportive care. Consider SBIRT or social work consult when stable.  Signature:  Clifton Safley [facility]  2:05 PM, 03/13/2024  On Call pager [phone]

## 2024-03-13 NOTE — Evaluation (Signed)
 Occupational Therapy Evaluation Patient Details Name: Francisco [name] MRN: [id number] DOB: 09-18-1960 Today's Date: 03/13/2024   History of Present Illness   Patient is a 63 yo male who presented 03/07/24 (after discharge 11/17) with SOB. Admitted with COPD exacerbation. Pt with multiple recent ED visits/hospitalizations for the same over the past month. PMH includes CHF, COPD, CAD s/p PCI, HTN, MI, Hep-C, tobacco use, IVDA (previously heroin, methamphetamine).     Clinical Impressions Jaquavion was evaluated s/p the above admission list. He is indep at baseline, however states he has been struggling with mobility and self care due to SOB. Upon evaluation the pt was limited by significant SOB with desaturation despite supplemental O2. On arrival pt was on 4L and satting at 92%, after bed mobility and a 2 minute rest break he was stable on 4L. Pt ambulated independently to the sink (6ft) and stood to brush his teeth, he desatted to 68% (poor pleth, unsure of accuracy), he was severely SOB and reported dizziness. He sat and required 10 minutes and 10-15L O2 to recover back to 92% with slow titration back to 4L. Pt will benefit from continued acute OT services and skilled inpatient follow up therapy, <3 hours/day - due to extremely poor activity tolerance.      If plan is discharge home, recommend the following:   Assistance with cooking/housework;Assist for transportation     Functional Status Assessment   Patient has had a recent decline in their functional status and demonstrates the ability to make significant improvements in function in a reasonable and predictable amount of time.     Equipment Recommendations   Tub/shower seat     Recommendations for Other Services   PT consult     Precautions/Restrictions   Precautions Precautions: Other (comment) Precaution/Restrictions Comments: watch O2, on 2L baseline Restrictions Weight Bearing Restrictions Per Provider Order: No      Mobility Bed Mobility Overal bed mobility: Modified Independent                  Transfers Overall transfer level: Independent Equipment used: None                      Balance Overall balance assessment: No apparent balance deficits (not formally assessed)                                         ADL either performed or assessed with clinical judgement   ADL Overall ADL's : Modified independent                                       General ADL Comments: extremely limited by activity tolerance, SOB and O2 need. no physical assis or AD needed. Pt required a 10 minute sitting rest break after a 35ft walk and 1 minute standing to brush teeth     Vision Baseline Vision/History: 0 No visual deficits Vision Assessment?: No apparent visual deficits     Perception Perception: Within Functional Limits       Praxis Praxis: WFL       Pertinent Vitals/Pain Pain Assessment Pain Assessment: No/denies pain     Extremity/Trunk Assessment Upper Extremity Assessment Upper Extremity Assessment: Overall WFL for tasks assessed   Lower Extremity Assessment Lower Extremity Assessment: Overall WFL for tasks assessed  Cervical / Trunk Assessment Cervical / Trunk Assessment: Normal   Communication Communication Communication: No apparent difficulties   Cognition Arousal: Alert Behavior During Therapy: Anxious Cognition: No apparent impairments             OT - Cognition Comments: limited health literacy                 Following commands: Intact       Cueing  General Comments   Cueing Techniques: Verbal cues  pt on 4L on arrival, pt left on 4L for inital mobiliyt. H equickly desatted to the 70s and required 15L and a sitting rest break to recover   Exercises     Shoulder Instructions      Home Living Family/patient expects to be discharged to:: Private residence Living Arrangements:  Non-relatives/Friends Available Help at Discharge: Friend(s);Available PRN/intermittently Type of Home: House Home Access: Stairs to enter [employer] of Steps: 6 Entrance Stairs-Rails: Right;Left;Can reach both Home Layout: One level     Bathroom Shower/Tub: Chief Strategy Officer: Standard     Home Equipment: Other (comment);Shower seat   Additional Comments: Pt states he no longer has O2 at home.  Roommate is there most of the time he is there.      Prior Functioning/Environment Prior Level of Function : Independent/Modified Independent             Mobility Comments: No AD, does not leave home much, limited household mobility due to endurance/pulmonary deficits; states he was able to get up 6 steps when recently discharged- ADLs Comments: having issues being able to stand long enough to shower lately, but is independent; does not drive    OT Problem List: Decreased activity tolerance   OT Treatment/Interventions:        OT Goals(Current goals can be found in the care plan section)   Acute Rehab OT Goals Patient Stated Goal: to go to rehab to get better OT Goal Formulation: With patient Time For Goal Achievement: 03/27/24 Potential to Achieve Goals: Fair ADL Goals Pt/caregiver will Perform Home Exercise Program: Increased strength;Both right and left upper extremity;With written HEP provided Additional ADL Goal #1: Pt will complete ADLs while maintaining saturations >88% Additional ADL Goal #2: Pt will indep recall at least 3 energy conservation strategies   AM-PAC OT 6 Clicks Daily Activity     Outcome Measure Help from another person eating meals?: None Help from another person taking care of personal grooming?: None Help from another person toileting, which includes using toliet, bedpan, or urinal?: None Help from another person bathing (including washing, rinsing, drying)?: None Help from another person to put on and taking off  regular upper body clothing?: None Help from another person to put on and taking off regular lower body clothing?: None 6 Click Score: 24   End of Session Equipment Utilized During Treatment: Oxygen  Nurse Communication: Mobility status  Activity Tolerance: Other (comment) (limited by SOB) Patient left: in bed  OT Visit Diagnosis: Muscle weakness (generalized) (M62.81)                Time: 8949-8881 OT Time Calculation (min): 28 min Charges:  OT General Charges $OT Visit: 1 Visit OT Evaluation $OT Eval Moderate Complexity: 1 Mod OT Treatments $Self Care/Home Management : 8-22 mins  Lucie Kendall, OTR/L Acute Rehabilitation Services Office [phone] Secure Chat Communication Preferred   Lucie JONETTA Kendall 03/13/2024, 12:45 PM

## 2024-03-13 NOTE — Progress Notes (Addendum)
 NAME:  Francisco [name], MRN:  [id number], DOB:  20-Aug-1960, LOS: 6 ADMISSION DATE:  03/07/2024, CONSULTATION DATE:  11/20 REFERRING MD:  Dr. Lovie, CHIEF COMPLAINT:  copd exacerbation   History of Present Illness:  Patient is a 63 yo M w/ pertinent PMH COPD on 2L [place] at home, tobacco use, HFimpEF, CAD s/p stent placement, Afib on eliquis  presents to [facility] on 11/19 w/ copd exacerbation.  Patient frequently admitted for copd exacerbation. Most recently admitted on 11/16 and discharged on steroid taper on 11/17. Worsening sob since he left. Came back to [facility] on 11/19. Given nebs, mag, and steroids. CXR unremarkable. Flu positive. Given rocephin [place] and started on tamiflu . Admitted by IMTS to floors.  On 11/20 worsening respiratory distress and hypoxia. Tripoding. ABG 7.22, 105, 295, 42 on NRB. Placed on bipap. Patient very anxious given low dose ativan . Given dose of lasix . PCCM consulted. Downgraded from ICU on 22nd. Has had intermittent increased O2 needs, and intermittent NIPPV needs. Pulm asked to come back to re-assess on 11/25 for on-going higher FIO2 needs, and his intermittent NIPPV needs and also to comment on if we felt Bipap was mandatory prior to discharge    Pertinent  Medical History   Past Medical History:  Diagnosis Date   Atrial flutter with rapid ventricular response (HCC) 08/14/2023   CHF (congestive heart failure) (HCC)    COPD (chronic obstructive pulmonary disease) (HCC)    COPD with acute exacerbation (HCC) 10/21/2021   Coronary artery disease    a. s/p prior PCI.   Hepatitis-C    History of CHF (congestive heart failure) 01/28/2024   History of stroke 02/13/2023   Hypertension    IV drug abuse (HCC)    a. 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   Medically noncompliant    MI (myocardial infarction) (HCC)    TIA (transient ischemic attack) 02/14/2023   Tobacco abuse      Significant Hospital Events: Including procedures, antibiotic start and stop dates  in addition to other pertinent events   11/19 admit copd exacerbation 11/20 hypercapnia resp failure placed on bipap; transferred to icu  Interim History / Subjective:  Feeling better at rest but desaturated to 68% just ambulating 10 ft from bed to bathroom on 4 lpm. Had O2 titrated up to 15 lpm, now down to 5. Took about to recover sats and symptom wise  Did wear his BIPAP last night felt like it helped   Objective    Blood pressure 130/68, pulse 81, temperature 97.7 F (36.5 C), temperature source Oral, resp. rate 20, height 6' (1.829 m), weight 92.8 kg, SpO2 92%.    FiO2 (%):  [40 %] 40 % PEEP:  [5 cmH20] 5 cmH20 Pressure Support:  [5 cmH20] 5 cmH20   Intake/Output Summary (Last 24 hours) at 03/13/2024 1014 Last data filed at 03/13/2024 9261 Gross per 24 hour  Intake --  Output 2250 ml  Net -2250 ml   Filed Weights   03/09/24 0325 03/10/24 0605 03/13/24 0702  Weight: 93.4 kg 90.5 kg 92.8 kg    Examination: General this is a 63 year old male who is currently sitting in bathroom after ambulating still has sig wob speaking 2-3 word phrases  HENT NCAT no JVD does have upper airway wheezing. MMM no thrush no JVD Pulm diffuse prolonged expiratory wheezing decreased on Left. + accessory use w/ exertion. Currently on 5 lpm Card rrr w/out MRG Abd soft Ext cool. Has BLE edema both ankles w/ sock indentation noted  Neuro intact    Resolved problem list   Assessment and Plan   Acute on chronic respiratory failure w/ hypoxia/hypercapnia COPD exacerbation Severe pulmonary HTN Influenza A positive PAF Hx of HFimpEF: EF 25-30% 2021 now 60-65% 07/2023 Hx of htn/hld Ble mottling With severe/critical limb ischemia involving the LLE (has vascular f/u now)  Hx of CAD s/p pci Tobacco abuse Substance abuse: methamphetamine use Hx of anxiety/depression  Pulm problem list  Acute on chronic hypoxic & Hypercarbic respiratory failure 2/2 Influenza A infection and resultant  ACEOPD.  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  With evidence of air trapping, severely restricted DLCO.   Hospital course  Downgraded out of ICU on 11/22. Had been requiring intermittent NIPPV. As of 11/23 still more hypoxic than baseline but feeling better. Sats down to low 80s on 5L [place] w/ minimal exertion. Only tolerated 2h of BIPAP on 11/22.  Was on HFNC all day 23rd.  24th still having ambulatory hypoxia. Had to be placed back on BIPAP 11/24 d/t desaturation (was on for about 2h ) required increased O2 needs again over course of the day. He wore the BIPAP another 3 hrs on the evening of 11/24 to early am 11/25.  11/25 Current O2 needs 3 lpm. No imaging since 11/20. ECHO w/ good EF. Looks like O2 needs have been increased anytime he exerts himself.   Discussion  Overall seems better w/ tamiflu , systemic steroids and nebulized BDs but O2 needs/WOB seem to escalate  greatly still w/ minimal exertion. Not sure if this is his new baseline or not. Suspect that he has needed more than 2-4 liters for some time and that his actual exertional need may be closer to 6 lpm but think it's worth while to try a trial of IV diuretics to see if fluid mobilization may help w/ his exertional needs. As far as the question about BIPAP: Due to the severity of the patient's chronic respiratory failure, secondary to COPD, the patient requires volume targeted pressure support from Home Mechanical Ventilation. BIPAP, BIPAP ST/ STA (RAD) have been ruled out. RAD with or without back up rate does not supply volume targeted ventilation to manage the patients Hypercapnia. OSA is not the predominant cause of their hypercapnia, and HMV is not being ordered to treat OSA. The patient requires ventilatory support for more than eight hours per 24-hour period to normalize the patient's PaCO2 and prevent readmissions. Failure to adequately ventilate  will result in serious harm and/or death to the patient.    Plan/rec I will reach out to [employer] rep and see about NIPPV instead of BIPAP. May need AM ABG. Given his current marked oxygen  need I do not think he is ready for discharge (desaturating down to 60s and not clear if this is baseline or how much oxygen  he will need) Will give one time dose of lasix  Check BNP Coughs a little after meals so will empirically add PPI BID Smoking cessation  Currently on Breztri     Critical care time:  My time 45 min included: review of most recent records, direct face to face time obtaining history, performing physical exam, developing and documenting plan as well as discussing this plan with the patient and/or care givers.  [phone] on-going problem responding inadequately >35 min

## 2024-03-14 ENCOUNTER — Other Ambulatory Visit (HOSPITAL_COMMUNITY): Payer: Self-pay

## 2024-03-14 DIAGNOSIS — Z66 Do not resuscitate: Secondary | ICD-10-CM

## 2024-03-14 DIAGNOSIS — Z515 Encounter for palliative care: Secondary | ICD-10-CM | POA: Diagnosis not present

## 2024-03-14 DIAGNOSIS — I272 Pulmonary hypertension, unspecified: Secondary | ICD-10-CM | POA: Diagnosis not present

## 2024-03-14 DIAGNOSIS — J1008 Influenza due to other identified influenza virus with other specified pneumonia: Secondary | ICD-10-CM | POA: Diagnosis not present

## 2024-03-14 DIAGNOSIS — J101 Influenza due to other identified influenza virus with other respiratory manifestations: Secondary | ICD-10-CM | POA: Diagnosis not present

## 2024-03-14 DIAGNOSIS — Z7189 Other specified counseling: Secondary | ICD-10-CM

## 2024-03-14 DIAGNOSIS — J9621 Acute and chronic respiratory failure with hypoxia: Secondary | ICD-10-CM | POA: Diagnosis not present

## 2024-03-14 DIAGNOSIS — J1001 Influenza due to other identified influenza virus with the same other identified influenza virus pneumonia: Secondary | ICD-10-CM | POA: Diagnosis not present

## 2024-03-14 DIAGNOSIS — Z9981 Dependence on supplemental oxygen: Secondary | ICD-10-CM | POA: Diagnosis not present

## 2024-03-14 DIAGNOSIS — Z7901 Long term (current) use of anticoagulants: Secondary | ICD-10-CM | POA: Diagnosis not present

## 2024-03-14 DIAGNOSIS — I110 Hypertensive heart disease with heart failure: Secondary | ICD-10-CM | POA: Diagnosis not present

## 2024-03-14 DIAGNOSIS — J9622 Acute and chronic respiratory failure with hypercapnia: Secondary | ICD-10-CM | POA: Diagnosis not present

## 2024-03-14 DIAGNOSIS — J440 Chronic obstructive pulmonary disease with acute lower respiratory infection: Secondary | ICD-10-CM | POA: Diagnosis not present

## 2024-03-14 DIAGNOSIS — I480 Paroxysmal atrial fibrillation: Secondary | ICD-10-CM | POA: Diagnosis not present

## 2024-03-14 DIAGNOSIS — I70202 Unspecified atherosclerosis of native arteries of extremities, left leg: Secondary | ICD-10-CM | POA: Diagnosis not present

## 2024-03-14 DIAGNOSIS — Z1152 Encounter for screening for COVID-19: Secondary | ICD-10-CM | POA: Diagnosis not present

## 2024-03-14 DIAGNOSIS — J449 Chronic obstructive pulmonary disease, unspecified: Secondary | ICD-10-CM | POA: Diagnosis not present

## 2024-03-14 DIAGNOSIS — I5033 Acute on chronic diastolic (congestive) heart failure: Secondary | ICD-10-CM | POA: Diagnosis not present

## 2024-03-14 DIAGNOSIS — I10 Essential (primary) hypertension: Secondary | ICD-10-CM | POA: Diagnosis not present

## 2024-03-14 LAB — CBC
HCT: 42.7 % (ref 39.0–52.0)
Hemoglobin: 13.6 g/dL (ref 13.0–17.0)
MCH: 31.5 pg (ref 26.0–34.0)
MCHC: 31.9 g/dL (ref 30.0–36.0)
MCV: 98.8 fL (ref 80.0–100.0)
Platelets: 174 K/uL (ref 150–400)
RBC: 4.32 MIL/uL (ref 4.22–5.81)
RDW: 13.5 % (ref 11.5–15.5)
WBC: 11 K/uL — ABNORMAL HIGH (ref 4.0–10.5)
nRBC: 0 % (ref 0.0–0.2)

## 2024-03-14 LAB — BASIC METABOLIC PANEL WITH GFR
Anion gap: 12 (ref 5–15)
BUN: 24 mg/dL — ABNORMAL HIGH (ref 8–23)
CO2: 34 mmol/L — ABNORMAL HIGH (ref 22–32)
Calcium: 9.3 mg/dL (ref 8.9–10.3)
Chloride: 96 mmol/L — ABNORMAL LOW (ref 98–111)
Creatinine, Ser: 1.02 mg/dL (ref 0.61–1.24)
GFR, Estimated: >60 mL/min (ref >60)
Glucose, Bld: 173 mg/dL — ABNORMAL HIGH (ref 70–99)
Potassium: 4.4 mmol/L (ref 3.5–5.1)
Sodium: 142 mmol/L (ref 135–145)

## 2024-03-14 LAB — BLOOD GAS, ARTERIAL
Acid-Base Excess: 17.4 mmol/L — ABNORMAL HIGH (ref 0.0–2.0)
Bicarbonate: 44.8 mmol/L — ABNORMAL HIGH (ref 20.0–28.0)
O2 Saturation: 99 %
Patient temperature: 37
pCO2 arterial: 63 mmHg — ABNORMAL HIGH (ref 32–48)
pH, Arterial: 7.46 — ABNORMAL HIGH (ref 7.35–7.45)
pO2, Arterial: 83 mmHg (ref 83–108)

## 2024-03-14 MED ORDER — MORPHINE SULFATE 15 MG PO TABS
7.5000 mg | ORAL_TABLET | ORAL | 0 refills | Status: DC | PRN
  Filled 2024-03-14: qty 15, 5d supply, fill #0

## 2024-03-14 MED ORDER — MORPHINE SULFATE (CONCENTRATE) 10 MG /0.5 ML PO SOLN
5.0000 mg | ORAL | Status: DC | PRN
  Administered 2024-03-14: 5 mg via SUBLINGUAL
  Filled 2024-03-14: qty 0.5

## 2024-03-14 MED ORDER — BIOTENE DRY MOUTH MT LIQD: 15.0000 mL | OROMUCOSAL | Status: DC | PRN

## 2024-03-14 MED ORDER — MORPHINE SULFATE (CONCENTRATE) 10 MG /0.5 ML PO SOLN
5.0000 mg | ORAL | 0 refills | Status: AC | PRN
  Filled 2024-03-14: qty 15, 5d supply, fill #0

## 2024-03-14 MED ORDER — ONDANSETRON 4 MG PO TBDP: 4.0000 mg | ORAL_TABLET | Freq: Four times a day (QID) | ORAL | Status: DC | PRN

## 2024-03-14 MED ORDER — GLYCOPYRROLATE 0.2 MG/ML IJ SOLN: 0.2000 mg | INTRAMUSCULAR | Status: DC | PRN

## 2024-03-14 MED ORDER — LORAZEPAM 1 MG PO TABS
1.0000 mg | ORAL_TABLET | ORAL | Status: DC | PRN
  Administered 2024-03-14: 1 mg via ORAL
  Filled 2024-03-14: qty 1

## 2024-03-14 MED ORDER — MORPHINE SULFATE (CONCENTRATE) 10 MG /0.5 ML PO SOLN
5.0000 mg | ORAL | Status: DC | PRN
  Administered 2024-03-14 (×2): 5 mg via ORAL
  Filled 2024-03-14 (×2): qty 0.5

## 2024-03-14 MED ORDER — ACETAMINOPHEN 325 MG PO TABS: 650.0000 mg | ORAL_TABLET | Freq: Four times a day (QID) | ORAL | Status: DC | PRN

## 2024-03-14 MED ORDER — POLYVINYL ALCOHOL 1.4 % OP SOLN: 1.0000 [drp] | Freq: Four times a day (QID) | OPHTHALMIC | Status: DC | PRN

## 2024-03-14 MED ORDER — GLYCOPYRROLATE 1 MG PO TABS: 1.0000 mg | ORAL_TABLET | ORAL | Status: DC | PRN

## 2024-03-14 MED ORDER — LORAZEPAM 2 MG/ML IJ SOLN
1.0000 mg | INTRAMUSCULAR | Status: DC | PRN
  Administered 2024-03-14: 1 mg via INTRAVENOUS
  Filled 2024-03-14: qty 1

## 2024-03-14 MED ORDER — POLYETHYLENE GLYCOL 3350 17 GM/SCOOP PO POWD
17.0000 g | Freq: Every day | ORAL | 0 refills | Status: AC
  Filled 2024-03-14: qty 238, 14d supply, fill #0

## 2024-03-14 MED ORDER — ONDANSETRON 4 MG PO TBDP
4.0000 mg | ORAL_TABLET | Freq: Four times a day (QID) | ORAL | 0 refills | Status: AC | PRN
  Filled 2024-03-14: qty 20, 5d supply, fill #0

## 2024-03-14 MED ORDER — LORAZEPAM 2 MG/ML PO CONC
1.0000 mg | ORAL | Status: DC | PRN
Start: 2024-03-14 — End: 2024-03-14

## 2024-03-14 MED ORDER — GLYCOPYRROLATE 1 MG PO TABS
1.0000 mg | ORAL_TABLET | ORAL | 0 refills | Status: AC | PRN
  Filled 2024-03-14: qty 10, 2d supply, fill #0

## 2024-03-14 MED ORDER — NITROGLYCERIN 0.4 MG SL SUBL
0.4000 mg | SUBLINGUAL_TABLET | SUBLINGUAL | Status: DC | PRN
  Administered 2024-03-14 (×2): 0.4 mg via SUBLINGUAL
  Filled 2024-03-14: qty 1

## 2024-03-14 MED ORDER — LIDOCAINE 5 % EX PTCH
3.0000 | MEDICATED_PATCH | Freq: Every day | CUTANEOUS | 0 refills | Status: AC | PRN
  Filled 2024-03-14: qty 30, 10d supply, fill #0

## 2024-03-14 MED ORDER — LORAZEPAM 1 MG PO TABS
1.0000 mg | ORAL_TABLET | ORAL | 0 refills | Status: AC | PRN
  Filled 2024-03-14: qty 10, 7d supply, fill #0

## 2024-03-14 MED ORDER — OXYCODONE HCL 5 MG PO TABS
5.0000 mg | ORAL_TABLET | Freq: Three times a day (TID) | ORAL | 0 refills | Status: AC | PRN
  Filled 2024-03-14: qty 12, 4d supply, fill #0

## 2024-03-14 MED ORDER — IBUPROFEN 200 MG PO TABS: 400.0000 mg | ORAL_TABLET | Freq: Two times a day (BID) | ORAL | Status: DC | PRN

## 2024-03-14 MED ORDER — ONDANSETRON HCL 4 MG/2ML IJ SOLN
4.0000 mg | Freq: Four times a day (QID) | INTRAMUSCULAR | Status: DC | PRN
Start: 2024-03-14 — End: 2024-03-14

## 2024-03-14 MED ORDER — ACETAMINOPHEN 650 MG RE SUPP: 650.0000 mg | Freq: Four times a day (QID) | RECTAL | Status: DC | PRN

## 2024-03-14 NOTE — Progress Notes (Signed)
 HD#7 SUBJECTIVE:  Patient Summary:Francisco [name] is a 63 year old man with a PMH of severe COPD with recurrent exacerbations, acute on chronic hypoxic/hypercapnic respiratory failure, HFrecEF, tobacco use disorder, polysubstance use, paroxysmal atrial fibrillation on Eliquis , CAD s/p PCI, hypertension, who presented with worsening dyspnea, anxiety, and hypercarbic respiratory failure and was admitted for COPD exacerbation with influenza A and acute on chronic respiratory failure requiring BiPAP.   Overnight Events: NAEON  Interim History: This morning, I visited the patient individually to discuss his goals of care and assess his understanding of his current condition. He acknowledges that he is in end-stage COPD and states that he does not want prolonged intubation or mechanical ventilation. He agreed to proceed with a palliative care meeting.  Earlier this morning, he experienced chest pain and shortness of breath. At that time, his oxygen  saturation was 86% on 4 L of oxygen . He was given two doses of nitroglycerin  and Atarax , and his oxygen  was increased. The patient reported improvement in his symptoms and was stable and prepared for the palliative care meeting.  The patient is transitioning to full comfort-focused care. End-of-life orders have been placed, along with a TOC referral for home hospice. The patient is strongly requesting immediate discharge home. Ativan  was administered for agitation. Hospice will require time to arrange appropriate services. Anticipate that support from his friends upon arrival may help with de-escalation, but there is concern that the patient may ultimately require return to the hospital for further symptom management.  OBJECTIVE:  Vital Signs: Vitals:   03/14/24 0810 03/14/24 0856 03/14/24 0904 03/14/24 0910  BP:  108/78 107/76 112/78  Pulse:  95 (!) 101 (!) 105  Resp: 18  16 20   Temp:      TempSrc:      SpO2:  (!) 89% (!) 89% (!) 89%  Weight:       Height:       Supplemental O2: Nasal Cannula SpO2: (!) 89 % O2 Flow Rate (L/min): 6 L/min FiO2 (%): 40 %  Filed Weights   03/10/24 0605 03/13/24 0702 03/14/24 0500  Weight: 90.5 kg 92.8 kg 93.1 kg    Intake/Output Summary (Last 24 hours) at 03/14/2024 1042 Last data filed at 03/14/2024 9373 Gross per 24 hour  Intake --  Output 2150 ml  Net -2150 ml   Net IO Since Admission: -8,129.95 mL [03/14/24 1042]  Physical Exam: Physical Exam General: Awake, mildly anxious, no acute distress. HEENT: MMM, no JVD. Lungs: Mild wheezing; Tachypnea, pursed-lip breathing. work of breathing improved on [place]. Cardiac: RRR, no murmurs. Abdomen: Soft, NT, ND. Extremities: No edema. Bilateral DP/PT pulses dopplerable and brisk. Mottling significantly improved. Sensation and motor intact. Legs warm. Neuro: Alert, follows commands; no focal deficits.   Patient Lines/Drains/Airways Status     Active Line/Drains/Airways     Name Placement date Placement time Site Days   Peripheral IV 03/07/24 20 G Anterior;Distal;Right;Upper Arm 03/07/24  0930  Arm  7   External Urinary Catheter 03/08/24  1100  --  6            Pertinent labs and imaging:      Latest Ref Rng & Units 03/14/2024    4:04 AM 03/13/2024    8:26 AM 03/11/2024    4:35 AM  CBC  WBC 4.0 - 10.5 K/uL 11.0  10.2  11.4   Hemoglobin 13.0 - 17.0 g/dL 86.3  85.8  86.1   Hematocrit 39.0 - 52.0 % 42.7  44.4  43.8   Platelets  150 - 400 K/uL 174  183  172        Latest Ref Rng & Units 03/14/2024    4:04 AM 03/13/2024    8:26 AM 03/12/2024    7:39 AM  CMP  Glucose 70 - 99 mg/dL 826  854  898   BUN 8 - 23 mg/dL 24  18  21    Creatinine 0.61 - 1.24 mg/dL 8.97  9.02  9.19   Sodium 135 - 145 mmol/L 142  141  137   Potassium 3.5 - 5.1 mmol/L 4.4  4.2  4.6   Chloride 98 - 111 mmol/L 96  91  90   CO2 22 - 32 mmol/L 34  41  38   Calcium  8.9 - 10.3 mg/dL 9.3  9.4  8.9     DG Chest Port 1 View Result Date: 03/13/2024 CLINICAL  DATA:  Respiratory failure. EXAM: PORTABLE CHEST 1 VIEW COMPARISON:  Radiograph 03/08/2024 FINDINGS: Nodular density in the right upper lung zone on prior is not well-defined on the current exam. Minimal ill-defined patchy opacity at the right lung base, new. Stable heart size and mediastinal contours. No pleural effusion or pneumothorax. Pulmonary vasculature is normal. IMPRESSION: 1. Minimal ill-defined patchy opacity at the right lung base, new from prior, atelectasis versus pneumonia. 2. Nodular density in the right upper lung zone on prior is not well-defined on the current exam. Electronically Signed   By: Andrea Gasman M.D.   On: 03/13/2024 15:50    ASSESSMENT/PLAN:  Assessment: Active Problems:   COPD with acute exacerbation (HCC)   Acute on chronic respiratory failure with hypoxia and hypercapnia (HCC)   Influenza A   Chronic atrial fibrillation (HCC)   Critical limb ischemia of left lower extremity (HCC)   Dependence on non-invasive ventilation (HCC)   Heart failure with recovered ejection fraction (HFrecEF) (HCC)   Plan: Francisco [name] is a 63 year old man with a PMH of severe COPD with recurrent exacerbations, acute on chronic hypoxic/hypercapnic respiratory failure, HFrecEF, tobacco use disorder, polysubstance use, paroxysmal atrial fibrillation on Eliquis , CAD s/p PCI, hypertension, who presented with worsening dyspnea, anxiety, and hypercarbic respiratory failure and was admitted for COPD exacerbation with influenza A and acute on chronic respiratory failure requiring BiPAP.  #Goals Of Care This morning, I visited the patient individually to discuss his goals of care and assess his understanding of his current condition. He acknowledges that he is in end-stage COPD and states that he does not want prolonged intubation or mechanical ventilation. He agreed to proceed with a palliative care meeting.  Earlier this morning, he experienced chest pain and shortness of breath. At that  time, his oxygen  saturation was 86% on 4 L of oxygen . He was given two doses of nitroglycerin  and Atarax , and his oxygen  was increased. The patient reported improvement in his symptoms and was stable and prepared for the palliative care meeting.  The patient is transitioning to full comfort-focused care. End-of-life orders have been placed, along with a TOC referral for home hospice. The patient is strongly requesting immediate discharge home. Ativan  was administered for agitation. Hospice will require time to arrange appropriate services. Anticipate that support from his friends upon arrival may help with de-escalation, but there is concern that the patient may ultimately require return to the hospital for further symptom management.  Hospital Problem List:  -------------------------------- # Acute on Chronic Hypercapnic/Hypoxic Respiratory Failure -Intermittent BiPAP; [place] between sessions. -Continue scheduled nebulizers and steroids as outlined. -Consider long-term need for home BiPAP  once stable. -Pulmonary toilet. -Continue DuoNeb q4h, Pulmicort  BID. -After stabilization: consider single triple therapy inhaler. -Continue IV methylprednisolone  40 mg BID .   # COPD Exacerbation (severe, recurrent) # Influenza A # Hypertension # Paroxysmal Atrial Fibrillation # Critical Limb Ischemia (initially suspected, now resolved) # HFrecEF - now hyperdynamic EF (70-75%) # AKI (improving) # CAD s/p PCI # Anxiety / Agitation # Tobacco Use Disorder # Substance Use Disorder (methamphetamine)  Signature: Pax Reasoner Bernad[facility]  10:42 AM, 03/14/2024  On Call pager [phone]

## 2024-03-14 NOTE — TOC Progression Note (Addendum)
 Transition of Care (TOC) - Progression Note  Rayfield Gobble RN,BSN Inpatient Care Management Unit 4NP (Non Trauma)- RN Case Manager See Treatment Team for direct Phone #   Patient Details  Name: Francisco [name] MRN: [id number] Date of Birth: 06-Sep-1960  Transition of Care [facility]) CM/SW Contact  Gobble Rayfield Hurst, RN Phone Number: 03/14/2024, 2:22 PM  Clinical Narrative:    Received call this am from MD and [facility] NP- Ronal L. Regarding plan now for comfort care and home w/ Hospice- pt is hopeful to return home today pending Hospice arrangements. He wants his friends Carliss and Powell Abbe to assist him.   Call made to Carliss Abbe- [phone] to discuss plans and confirm Hospice choice- per Carliss he plans to come up to speak with pt and make sure best plan in place. Carliss voiced that he is not sure about home w/ hospice vs facility until he speaks with pt and wants to hold off on referral until he gets here and speaks with pt as well as providers.  Carliss voiced that pt lives in home w/ roommate- however roommates will not be of much assistance to pt- and he and is wife will be coming to help pt- but will not be able to stay there with pt 24/7.  Number provider to Carliss for his wife Powell to call this CM to further discuss as she is a Therapist, music with [employer].   1140- received call from [facility]- per conversation Powell confirmed she and Carliss are planning to support pt on decision to return home hospice. She feels [facility] may be best fit to get DME there timely for transition home later today. She also states pt will need hospital bed as he only has mattress on the floor, in addition will need BSC, tub transfer bench, RW, and asking if Hospice will cover transfer chair.   Heather voiced she and Carliss will transport home once everything in place.  Pt will need transport 02 tank for discharge.  Heather agreeable on referral to [facility] for Home Hospice, and understands pt will  likely need placemen or transition to IP Hospice  if unable to manage at home.  Heather's contact is- [phone]  1150- Call made to [facility] liaison for Home Hospice referral- Inocente to follow up with pt and his friends to coordinate hospice needs.   1550- Received call from Mitch with [employer]- they are working along with [facility] for DME needs as well as home Bipap approval under Hospice. Per Mitch no new orders needed at this time.   Hopeful for DME to be delivered today and if delivered today then pt will be able to go home w/ Carliss transporting- Carliss has portable 02 tank that he will bring to transport home.   Please provide GOLD DNR for them to take home with them from chart  1515- updated by Hospice liaison that DME has been delivered to pt's home- attending team notified and will plan for d/c.    Expected Discharge Plan: Home/Self Care Barriers to Discharge: Other (must enter comment) (waiting on coordination of Hospice and DME delivery)               Expected Discharge Plan and Services     Post Acute Care Choice: Durable Medical Equipment, Hospice Living arrangements for the past 2 months: Single Family Home                 DME Arranged: Hospice Equipment Package Others   Date DME Agency Contacted:  03/14/24   Representative spoke with at DME Agency: Hospice to arrange DME- and to f/u w/ [employer] for Bipap HH Arranged: Disease Management HH Agency: [facility] Date [facility] Agency Contacted: 03/14/24 Time HH Agency Contacted: 1120 Representative spoke with at [facility] Agency: Amy   Social Drivers of Health (SDOH) Interventions SDOH Screenings   Food Insecurity: No Food Insecurity (03/11/2024)  Housing: Low Risk  (03/11/2024)  Transportation Needs: No Transportation Needs (03/11/2024)  Utilities: Not At Risk (03/11/2024)  Alcohol  Screen: Low Risk  (02/07/2023)  Depression (PHQ2-9): Low Risk  (02/08/2024)  Recent Concern: Depression  (PHQ2-9) - High Risk (11/21/2023)  Financial Resource Strain: High Risk (08/24/2023)   Received from [facility]  Social Connections: Unknown (08/14/2023)  Tobacco Use: High Risk (03/07/2024)  Health Literacy: Adequate Health Literacy (11/21/2023)    Readmission Risk Interventions     No data to display

## 2024-03-14 NOTE — Progress Notes (Signed)
   03/14/24 0845  Level of Consciousness  Level of Consciousness Alert  MEWS COLOR  MEWS Score Color Comfort Care Only  Pain Assessment  Pain Scale 0-10  Pain Score 10  Pain Type Acute pain  Pain Location Chest  Pain Descriptors / Indicators Pressure  Pain Frequency Intermittent  Pain Onset On-going  Pain Intervention(s) MD notified (Comment);Emotional support  PCA/Epidural/Spinal Assessment  Respiratory Pattern Tachypnea;Labored  Glasgow Coma Scale  Eye Opening 4  Best Verbal Response (NON-intubated) 5  Best Motor Response 6  Glasgow Coma Scale Score 15  MEWS Score  MEWS Temp 0  MEWS Systolic 0  MEWS Pulse 0  MEWS RR 0  MEWS LOC 0  MEWS Score 0  Provider Notification  Provider Name/Title Internal Medicine team. Dr. Bernadine  Date Provider Notified 03/14/24  Time Provider Notified 0845  Method of Notification Page  Notification Reason Change in status (pt with 10/10 chest pain)  Provider response En route  Date of Provider Response 03/14/24  Time of Provider Response 0845   0845- RN and Ginger, CN to pt room. Pt screaming My chest hurts 10/10. Pt tachypneic with labored respirations, pt states he takes nitroglycerin  at home for frequent angina. Internal Medicine team paged, and en route. Verbal order received to obtain EKG.  9148- Dr. Azadegan at bedside, and EKG shown to MD. Verbal order received from Dr. Azadegan to increase oxygen  to 6L [place]. New orders received.  9144- Palliative care team at bedside.

## 2024-03-14 NOTE — Hospital Course (Addendum)
 Francisco [name] is a 63 year old man with severe COPD (FEV? 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  #Acute on Chronic Hypercapnic/Hypoxic Respiratory Failure  On admission, he was tachypneic, using accessory muscles, and required escalating oxygen  support. Initial VBG and ABG demonstrated significant chronic CO2 retention, which worsened on 11/20 (ABG: pH 7.22, pCO2 105) prompting Rapid Response and transfer to the ICU for BiPAP. He improved with non-invasive ventilation, steroids (Solu-Medrol  then methylprednisolone  40 mg BID), scheduled bronchodilators (DuoNeb, Pulmicort , Brovana ), Tamiflu , and cautious diuresis.  He was transitioned back to the floor on 11/22, but continued to have significant exertional desaturation (O2 sat 60-80% with minimal walking). PCCM evaluated him and felt this represented end-stage COPD with progression to a new higher baseline O2 requirement and need for long-term volume-targeted BiPAP at home. Despite therapy, he remained severely limited and oxygen -dependent.  On 11/26, the patient elected comfort-focused care, stopping disease-directed interventions and transitioning toward home hospice.  #COPD Exacerbation (Severe, Recurrent) Exacerbation was triggered by Influenza A, active tobacco use, and poor pulmonary reserve. There was no radiographic evidence of a bacterial pneumonia. He received systemic steroids (5-day course of methylprednisolone ), scheduled bronchodilators, mucolytics, and smoking cessation support. Antibiotics initiated in the ED were stopped after negative workup. Symptoms improved but never returned to his baseline.  # Influenza A Tested positive on admission.  Treated with a full Tamiflu  course. No evidence of bacterial superinfection. Mild right basilar opacity on 11/25 was felt to be atelectasis vs. early infiltrate but did not require antibiotics given clinical stability.  #Severe Anxiety with Hyperadrenergic Response Throughout hospitalization, the patient had repeated episodes of severe anxiety presenting with shaking, tachycardia, hypertension, and increased work of breathing. These episodes contributed significantly to respiratory distress and BiPAP intolerance. He received Atarax , a one-time dose of Ativan  in the ICU, and later Zyprexa  IM for agitation. Anxiety remained a major driver of symptoms. Anxiety management transitioned to comfort-oriented medications after goals-of-care change.  #Lower-Extremity Mottling / Suspected Critical Limb Ischemia (Resolved) On 11/20, mottling and cool, purple feet with initially absent dopplerable signals (L>R) were noted during respiratory decompensation and vasoconstriction. ABIs showed absent left ABI, unchanged right ABI from prior studies. Vascular surgery evaluated him and suspected acute on chronic PAD. While he was briefly placed on heparin , repeat vascular assessment showed brisk bilateral AT/PT signals, intact neurologic exam, and complete resolution of ischemic concern. No intervention was required. He was transitioned back to Eliquis  as his anticoagulant once stable.  # Paroxysmal Atrial Fibrillation He remained in sinus rhythm with intermittent tachycardia from anxiety and respiratory distress. Anticoagulation initially switched from Eliquis  to heparin  gtt during ischemia workup, then transitioned back to Eliquis  after pulses improved. Rate control with metoprolol  was continued; diltiazem  was temporarily held and later resumed.  #HFrecEF with Hyperdynamic LV Function History of EF 25-30% (2021) improved to 60-65% and hyperdynamic (70-75%) this admission, likely from physiologic stress. He was mildly  volume-overloaded early in admission and benefited from IV Lasix , achieving ~8 L negative balance. No further diuresis required after stabilization. Guideline-directed therapy resumed as tolerated.  #Acute Kidney Injury (Resolved) Transient AKI occurred in the setting of hypotension and vasoconstriction during his ICU event. Creatinine improved from 1.65 ? baseline ~1.0 with fluids and hemodynamic support.  #CAD s/p PCI He had intermittent chest discomfort  during anxiety episodes but no evidence of ACS. EKGs unchanged; troponins remained negative. Continued on statin and Eliquis .  #Tobacco and Methamphetamine Use Disorder Active tobacco use and intermittent IV methamphetamine use contributed significantly to recurrent exacerbations and anxiety. Counseling was provided, nicotine  patch continued. After goals-of-care change, cessation strategies shifted toward comfort-focused discussions.  #Goals of Care / Transition to Comfort Measures On 11/26, after repeated discussions, the patient acknowledged his end-stage COPD, inability to return to prior baseline, and desire to avoid future intubation or aggressive interventions. Palliative Care met with the patient and his friends. He elected DNR, comfort-focused care only, and requested discharge home with hospice.  Comfort orders were initiated, including morphine , Ativan , anticholinergics, Haldol , Tylenol , and PRN treatments for dyspnea and anxiety. Hospice coordinated equipment (hospital bed, NIV, bedside commode), medications, and discharge planning.  Francisco [name] was admitted with a severe COPD exacerbation triggered by Influenza A, complicated by acute on chronic hypercapnic respiratory failure requiring ICU-level BiPAP support. His course was further complicated by severe anxiety, transient concern for limb ischemia, and significant exertional hypoxia despite treatment. Despite clinical stabilization from the acute event, he remained profoundly limited  with end-stage lung disease. After extensive goals-of-care discussions with Palliative Care and the primary team, he elected DNR/comfort-focused care with discharge home under hospice services on 03/14/2024.

## 2024-03-14 NOTE — Consult Note (Addendum)
 Palliative Care Consult Note                                  Date: 03/14/2024   Patient Name: Francisco [name]  DOB:09/14/1960  FMW:[id number]  Age / Sex:63 y.o., male  PCP: Heddy Barren, DO Referring Physician: Lovie Clarity, MD  Reason for Consultation: Establishing goals of care  Past Medical History:  Diagnosis Date   Atrial flutter with rapid ventricular response (HCC) 08/14/2023   CHF (congestive heart failure) (HCC)    COPD (chronic obstructive pulmonary disease) (HCC)    COPD with acute exacerbation (HCC) 10/21/2021   Coronary artery disease    a. s/p prior PCI.   Hepatitis-C    History of CHF (congestive heart failure) 01/28/2024   History of stroke 02/13/2023   Hypertension    IV drug abuse (HCC)    a. 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   Medically noncompliant    MI (myocardial infarction) (HCC)    TIA (transient ischemic attack) 02/14/2023   Tobacco abuse      Assessment & Plan:   HPI/Patient Profile: 63 y.o. male  with past medical history of severe COPD with recurrent exacerbations, HFrecEF, tobacco use disorder, polysubstance use admitted on 03/07/2024 with COPD exacerbation and influenza A.   Palliative medicine consulted for goals of care.   SUMMARY OF RECOMMENDATIONS   DNR-comfort TOC order placed for home with hospice Comfort measure orders placed Action plan for managing exacerbations including taking his oral/sublingual medications as well as administering breathing treatments Emphasize that he is to call hospice first to help manage his symptoms rather than calling emergency services, but that he always has the option to return to the hospital if he feels that his symptoms are not being adequately met at home Patient is high risk for readmission for exacerbation given his history and needs thorough education going forward about hospice and symptom management  Symptom Management:   Morphine  for pain/dyspnea/increased work of breathing/RR>25 Tylenol  PRN pain/fever Biotin PRN daily Benadryl  PRN itching Robinul  PRN secretions Haldol  PRN agitation/delirium Ativan  PRN anxiety/seizure/sleep/distress Zofran  PRN nausea/vomiting Liquifilm Tears PRN dry eye   Code Status: DNR - Comfort  Prognosis:  < 6 months  Discharge Planning:  Home with Hospice   Discussed with: Lovie MD, Bernadine MD, Webster RN-CM on 03/14/2024 about the patient's wishes to pursue home with hospice.   Subjective:   Reviewed medical records, received report from team, assessed the patient and then meet at the patient's bedside to discuss diagnosis, prognosis, GOC, EOL wishes disposition and options.  Per progress note on 11/26 by Jenna NP with Critical Care patient has been in the hospital since 11/19 for COPD exacerbation and influenza. Patient briefly in ICU 11/20-11/22 for non-invasive positive pressure ventilation and acute hypoxemic and hypercapnic respiratory failure.   I met with patient at bedside and joined by Carliss and Powell (friends) by phone. Initial meeting in the morning was during an episode where patient was complaining of chest pain and was in the process of getting assessed by primary team and completing an EKG.    We meet to discuss diagnosis prognosis, GOC, EOL wishes, disposition and options. Concept of Palliative Care was introduced as specialized medical care for people and their families living with serious illness.  If focuses on providing relief from the symptoms and stress of a serious illness.  The goal is to improve quality of life for both the  patient and the family. Values and goals of care important to patient and family were attempted to be elicited.  Created space and opportunity for patient  and family to explore thoughts and feelings regarding current medical situation   Natural trajectory and current clinical status were discussed. Questions and concerns  addressed. Patient encouraged to call with questions or concerns.    Patient Understanding of Illness: - Patient and friends understand that the patient is in end stage COPD with frequent hospitalizations  Life Review: - Patient is not married and has parents who are elderly but live in [place] with their own health issues and are not able to assist him  Today's Discussion: - Discussed comfort measures and hospice with patient and patient shared that he has a close friend who works in hospice who has been telling him that he needs to consider it - Explained that hospice means end of life and that the medical team will no longer continue aggressive measures to prolong his life going forward but will focus giving him medications to treat his shortness of breath, anxiety, and any discomfort - Patient decided to transition to comfort measures with goal of home with hospice stating that he wants to go home today if possible - Patient did get very anxious and agitated during the whole discussion and became belligerent  - Discussed code status with patient and the poor chances of recovery if it were attempted given the patient's extensive COPD and patient agreed with DNR/DNI  Second Meeting: - Revisited patient again with friend Carliss bedside with patient expressing that he wants to leave AMA and does not want to wait for hospice to set up home DME and provide medications - Discussed with patient that if he went home now, he would not have the medications that he needs to help manage his shortness of breath that he repeatedly returns to the hospital for - Educated patient that the goal of hospice is to allow him to focus on his symptoms and that he will have to be his own primary caregiver as he does not have the support around him to care for him 24/7 - Reinforced that he will have to be aggressive with his symptom management at home as the medications he will get are oral or sublingual and that the  time it takes to take effect will be about an hour, which is longer than the time for his current IV medications which act more quickly and he expressed understanding - Also educated patient that if he begins to feel short of breath he can also take his breathing treatments as well as taking his morphine  or anxiety medications - Encouraged patient to reach out to hospice first to help with his symptom management rather than calling 911 - Educated him that he needs to set up an action plan to manage his exacerbations so that he avoids returning to the hospital - Completed MOST form with patient and provided multiple copies  Goals: - Wants to return home  Review of Systems  Constitutional:  Positive for activity change and fatigue.  Respiratory:  Positive for shortness of breath.     Objective:   Primary Diagnoses: Present on Admission:  COPD with acute exacerbation (HCC)   Vital Signs:  BP 112/78 (BP Location: Left Arm)   Pulse (!) 105   Temp 98.1 F (36.7 C) (Oral)   Resp 20   Ht 6' (1.829 m)   Wt 93.1 kg   SpO2 (!) 89%  BMI 27.84 kg/m   Physical Exam Constitutional:      Appearance: He is obese.  HENT:     Head: Normocephalic.     Nose: Nose normal.     Mouth/Throat:     Mouth: Mucous membranes are dry.  Eyes:     Extraocular Movements: Extraocular movements intact.  Cardiovascular:     Rate and Rhythm: Normal rate.  Pulmonary:     Comments: On 2 L/min [place], tachypneic 20-30s, with accessory muscle usage Abdominal:     Palpations: Abdomen is soft.  Skin:    General: Skin is warm.  Neurological:     General: No focal deficit present.     Mental Status: He is alert.  Psychiatric:     Comments: Poor judgement. Anxious.     Palliative Assessment/Data: 60%    Thank you for allowing us  to participate in the care of Lemond Daring PMT will continue to support holistically.  I personally spent a total of 75 minutes in the care of the patient today including  preparing to see the patient, getting/reviewing separately obtained history, performing a medically appropriate exam/evaluation, counseling and educating, placing orders, referring and communicating with other health care professionals, and documenting clinical information in the EHR.   Signed by: Fairy FORBES Shan DEVONNA Palliative Medicine Team  Team Phone # [phone] (Nights/Weekends)  03/14/2024, 11:10 AM

## 2024-03-14 NOTE — Discharge Instructions (Signed)
 Mr. Sjogren,   It was a pleasure taking care of you in the hospital. Take care!  Best, Internal medicine team

## 2024-03-14 NOTE — Discharge Summary (Signed)
 Name: Francisco [name] MRN: [id number] DOB: 1961-03-09 63 y.o. PCP: Heddy Barren, DO  Date of Admission: 03/07/2024  9:34 AM Date of Discharge: 03/14/2024 Attending Physician: Dr. Mliss Foot  Discharge Diagnosis: 1. Active Problems:   COPD with acute exacerbation (HCC)   Acute on chronic respiratory failure with hypoxia and hypercapnia (HCC)   Influenza A   Chronic atrial fibrillation (HCC)   Critical limb ischemia of left lower extremity (HCC)   Dependence on non-invasive ventilation (HCC)   Heart failure with recovered ejection fraction (HFrecEF) (HCC)   Goals of care, counseling/discussion    Discharge Medications: Allergies as of 03/14/2024       Reactions   Ivp Dye [iodinated Contrast Media] Other (See Comments)   Seizures   Ultram [tramadol] Other (See Comments)   Seizures   Vibramycin  [doxycycline ] Other (See Comments)   Burning sensation to his hands.        Medication List     STOP taking these medications    atorvastatin  40 MG tablet Commonly known as: LIPITOR    diltiazem  120 MG 24 hr capsule Commonly known as: CARDIZEM  CD   losartan  50 MG tablet Commonly known as: COZAAR    metoprolol  tartrate 25 MG tablet Commonly known as: LOPRESSOR    thiamine  100 MG tablet Commonly known as: VITAMIN B1       TAKE these medications    Airsupra  90-80 MCG/ACT Aero Generic drug: Albuterol -Budesonide  Inhale 2 puffs into the lungs every 4 (four) hours as needed (wheeze, shortness of breath).   albuterol  (2.5 MG/3ML) 0.083% nebulizer solution Commonly known as: PROVENTIL  Take 2.5 mg by nebulization every 6 (six) hours as needed for wheezing or shortness of breath.   albuterol  108 (90 Base) MCG/ACT inhaler Commonly known as: VENTOLIN  HFA Inhale 1-2 puffs into the lungs every 6 (six) hours as needed for wheezing or shortness of breath.   apixaban  5 MG Tabs tablet Commonly known as: Eliquis  Take 1 tablet (5 mg total) by mouth 2 (two) times daily.    benzonatate  200 MG capsule Commonly known as: TESSALON  Take 1 capsule (200 mg total) by mouth 3 (three) times daily as needed for cough.   buPROPion  150 MG 12 hr tablet Commonly known as: Wellbutrin  SR Take 1 tablet (150 mg total) by mouth 2 (two) times daily. Take 1 tablet once daily for the first 3 days (on 10/15-10/17), then take one tablet twice daily (10/18 and onward)   Dupixent  300 MG/2ML Soaj Generic drug: Dupilumab  Inject 300 mg into the skin every 14 (fourteen) days. What changed:  when to take this additional instructions   fluticasone  50 MCG/ACT nasal spray Commonly known as: FLONASE  Place 1 spray into both nostrils daily. What changed:  when to take this reasons to take this   glycopyrrolate  1 MG tablet Commonly known as: ROBINUL  Take 1 tablet (1 mg total) by mouth every 4 (four) hours as needed (excessive secretions).   hydrOXYzine  25 MG tablet Commonly known as: ATARAX  Take 1 tablet (25 mg total) by mouth 3 (three) times daily as needed for anxiety (Can take extra dose PRN not exceeding 50 mg TID).   ipratropium-albuterol  0.5-2.5 (3) MG/3ML Soln Commonly known as: DUONEB Take 3 mLs by nebulization every 4 (four) hours as needed. What changed: reasons to take this   lidocaine  5 % Commonly known as: LIDODERM  Place 3 patches onto the skin daily as needed. Remove & Discard patch within 12 hours or as directed by MD   loratadine  10 MG tablet Commonly  known as: Claritin  Take 1 tablet (10 mg total) by mouth daily.   LORazepam  1 MG tablet Commonly known as: ATIVAN  Take 1 tablet (1 mg total) by mouth every 4 (four) hours as needed for anxiety.   morphine  CONCENTRATE 10 mg / 0.5 ml concentrated solution Take 0.25 mLs (5 mg total) by mouth every 2 (two) hours as needed for moderate pain (pain score 4-6) (or dyspnea).   nicotine  21 mg/24hr patch Commonly known as: NICODERM CQ  - dosed in mg/24 hours Place 1 patch (21 mg total) onto the skin daily.    nitroGLYCERIN  0.4 MG SL tablet Commonly known as: NITROSTAT  Place 1 tablet (0.4 mg total) under the tongue every 5 (five) minutes x 3 doses as needed for chest pain.   ondansetron  4 MG disintegrating tablet Commonly known as: ZOFRAN -ODT Take 1 tablet (4 mg total) by mouth every 6 (six) hours as needed for nausea.   oxyCODONE  5 MG immediate release tablet Commonly known as: Roxicodone  Take 1 tablet (5 mg total) by mouth every 8 (eight) hours as needed.   OXYGEN  Inhale 2-4 L/min into the lungs See admin instructions. Inhale 2 L/min continuously (baseline). May increase up to 4 L/min if needed for poor oxygen  saturation or exertion.   polyethylene glycol powder 17 GM/SCOOP powder Commonly known as: GLYCOLAX /MIRALAX  Dissolve 1 capful (17g) in 4-8 ounces of liquid and take by mouth daily. Start taking on: March 15, 2024   predniSONE  10 MG tablet Commonly known as: DELTASONE  Take 4 tablets (40 mg total) by mouth daily with breakfast for 4 days, THEN 3 tablets (30 mg total) daily with breakfast for 5 days, THEN 2 tablets (20 mg total) daily with breakfast for 5 days, THEN 1 tablet (10 mg total) daily with breakfast for 5 days. Start taking on: March 06, 2024   Trelegy Ellipta  200-62.5-25 MCG/ACT Aepb Generic drug: Fluticasone -Umeclidin-Vilant Inhale 1 puff into the lungs daily.        Disposition and follow-up:   At the hospital follow-up visit, please address:  Symptom management needs: ongoing control of dyspnea, anxiety, agitation, and pain; review effectiveness of morphine , Ativan , and PRN comfort medications; confirm adequacy of home oxygen  delivery and BiPAP equipment.  Labs / imaging needed at time of follow-up: None indicated: comfort-focused care only.  Follow-up Appointments:  Follow-up Information     [facility] (DME) Follow up.   Specialty: DME Services Why: home Bipap and DME arranged w/ Hospice Contact information: [address] [place]  [address] [phone]        Marylu Gee, DO Follow up on 03/22/2024.   Specialty: Internal Medicine Why: 02/21/2024 8:45 AM Hospital Follow up Contact information: [address] [place] [address] [phone]         [facility] Follow up.   Why: Home Hospice referral- they will make initial visit on 11/27 and arrange needed DME to deliver to the home Contact information: [address] [place] [address] [phone]                  Hospital Course by problem list:  [name] is a 63 year old man with severe COPD (FEV? 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  #Acute on Chronic Hypercapnic/Hypoxic Respiratory  Failure  On admission, he was tachypneic, using accessory muscles, and required escalating oxygen  support. Initial VBG and ABG demonstrated significant chronic CO2 retention, which worsened on 11/20 (ABG: pH 7.22, pCO2 105) prompting Rapid Response and transfer to the ICU for BiPAP. He improved with non-invasive ventilation, steroids (Solu-Medrol  then methylprednisolone  40 mg BID), scheduled bronchodilators (DuoNeb, Pulmicort , Brovana ), Tamiflu , and cautious diuresis.  He was transitioned back to the floor on 11/22, but continued to have significant exertional desaturation (O2 sat 60-80% with minimal walking). PCCM evaluated him and felt this represented end-stage COPD with progression to a new higher baseline O2 requirement and need for long-term volume-targeted BiPAP at home. Despite therapy, he remained severely limited and oxygen -dependent.  On 11/26, the patient elected comfort-focused care,  stopping disease-directed interventions and transitioning toward home hospice.  #COPD Exacerbation (Severe, Recurrent) Exacerbation was triggered by Influenza A, active tobacco use, and poor pulmonary reserve. There was no radiographic evidence of a bacterial pneumonia. He received systemic steroids (5-day course of methylprednisolone ), scheduled bronchodilators, mucolytics, and smoking cessation support. Antibiotics initiated in the ED were stopped after negative workup. Symptoms improved but never returned to his baseline.  # Influenza A Tested positive on admission. Treated with a full Tamiflu  course. No evidence of bacterial superinfection. Mild right basilar opacity on 11/25 was felt to be atelectasis vs. early infiltrate but did not require antibiotics given clinical stability.  #Severe Anxiety with Hyperadrenergic Response Throughout hospitalization, the patient had repeated episodes of severe anxiety presenting with shaking, tachycardia, hypertension, and increased work of breathing. These episodes contributed significantly to respiratory distress and BiPAP intolerance. He received Atarax , a one-time dose of Ativan  in the ICU, and later Zyprexa  IM for agitation. Anxiety remained a major driver of symptoms. Anxiety management transitioned to comfort-oriented medications after goals-of-care change.  #Lower-Extremity Mottling / Suspected Critical Limb Ischemia (Resolved) On 11/20, mottling and cool, purple feet with initially absent dopplerable signals (L>R) were noted during respiratory decompensation and vasoconstriction. ABIs showed absent left ABI, unchanged right ABI from prior studies. Vascular surgery evaluated him and suspected acute on chronic PAD. While he was briefly placed on heparin , repeat vascular assessment showed brisk bilateral AT/PT signals, intact neurologic exam, and complete resolution of ischemic concern. No intervention was required. He was transitioned back to Eliquis  as his  anticoagulant once stable.  # Paroxysmal Atrial Fibrillation He remained in sinus rhythm with intermittent tachycardia from anxiety and respiratory distress. Anticoagulation initially switched from Eliquis  to heparin  gtt during ischemia workup, then transitioned back to Eliquis  after pulses improved. Rate control with metoprolol  was continued; diltiazem  was temporarily held and later resumed.  #HFrecEF with Hyperdynamic LV Function History of EF 25-30% (2021) improved to 60-65% and hyperdynamic (70-75%) this admission, likely from physiologic stress. He was mildly volume-overloaded early in admission and benefited from IV Lasix , achieving ~8 L negative balance. No further diuresis required after stabilization. Guideline-directed therapy resumed as tolerated.  #Acute Kidney Injury (Resolved) Transient AKI occurred in the setting of hypotension and vasoconstriction during his ICU event. Creatinine improved from 1.65 ? baseline ~1.0 with fluids and hemodynamic support.  #CAD s/p PCI He had intermittent chest discomfort during anxiety episodes but no evidence of ACS. EKGs unchanged; troponins remained negative. Continued on statin and Eliquis .  #Tobacco and Methamphetamine Use Disorder Active tobacco use and intermittent IV methamphetamine use contributed significantly to recurrent exacerbations and anxiety. Counseling was provided, nicotine  patch continued. After goals-of-care change, cessation strategies shifted toward comfort-focused discussions.  #Goals of Care / Transition to  Comfort Measures On 11/26, after repeated discussions, the patient acknowledged his end-stage COPD, inability to return to prior baseline, and desire to avoid future intubation or aggressive interventions. Palliative Care met with the patient and his friends. He elected DNR, comfort-focused care only, and requested discharge home with hospice.  Comfort orders were initiated, including morphine , Ativan , anticholinergics,  Haldol , Tylenol , and PRN treatments for dyspnea and anxiety. Hospice coordinated equipment (hospital bed, NIV, bedside commode), medications, and discharge planning.  [name] was admitted with a severe COPD exacerbation triggered by Influenza A, complicated by acute on chronic hypercapnic respiratory failure requiring ICU-level BiPAP support. His course was further complicated by severe anxiety, transient concern for limb ischemia, and significant exertional hypoxia despite treatment. Despite clinical stabilization from the acute event, he remained profoundly limited with end-stage lung disease. After extensive goals-of-care discussions with Palliative Care and the primary team, he elected DNR/comfort-focused care with discharge home under hospice services on 03/14/2024.   Discharge Exam:   BP 112/78 (BP Location: Left Arm)   Pulse (!) 105   Temp 98.1 F (36.7 C) (Oral)   Resp 20   Ht 6' (1.829 m)   Wt 93.1 kg   SpO2 (!) 89%   BMI 27.84 kg/m  Discharge exam:  General: Awake, mildly anxious, no acute distress. HEENT: MMM, no JVD. Lungs: Mild wheezing; Tachypnea, pursed-lip breathing. work of breathing improved on [place]. Cardiac: RRR, no murmurs. Abdomen: Soft, NT, ND. Extremities: No edema. Bilateral DP/PT pulses dopplerable and brisk. Mottling significantly improved. Sensation and motor intact. Legs warm. Neuro: Alert, follows commands; no focal deficits.  Pertinent Labs, Studies, and Procedures:     Latest Ref Rng & Units 03/14/2024    4:04 AM 03/13/2024    8:26 AM 03/11/2024    4:35 AM  CBC  WBC 4.0 - 10.5 K/uL 11.0  10.2  11.4   Hemoglobin 13.0 - 17.0 g/dL 86.3  85.8  86.1   Hematocrit 39.0 - 52.0 % 42.7  44.4  43.8   Platelets 150 - 400 K/uL 174  183  172        Latest Ref Rng & Units 03/14/2024    4:04 AM 03/13/2024    8:26 AM 03/12/2024    7:39 AM  CMP  Glucose 70 - 99 mg/dL 826  854  898   BUN 8 - 23 mg/dL 24  18  21    Creatinine 0.61 - 1.24 mg/dL 8.97  9.02  9.19    Sodium 135 - 145 mmol/L 142  141  137   Potassium 3.5 - 5.1 mmol/L 4.4  4.2  4.6   Chloride 98 - 111 mmol/L 96  91  90   CO2 22 - 32 mmol/L 34  41  38   Calcium  8.9 - 10.3 mg/dL 9.3  9.4  8.9     VAS US  LOWER EXTREMITY VENOUS (DVT) Result Date: 03/08/2024  Lower Venous DVT Study Patient Name:  ARISTIDE WAGGLE  Date of Exam:   03/08/2024 Medical Rec #: [id number]    Accession #:    [id number] Date of Birth: 1961/02/28    Patient Gender: M Patient Age:   82 years Exam Location:  [facility] Procedure:      VAS US  LOWER EXTREMITY VENOUS (DVT) Referring Phys: JULIE MACHEN --------------------------------------------------------------------------------  Indications: Edema, and Respiratory distress, COPD exacerbation, Flu positive.  Limitations: Patient on pressors. Shadowing from calcific plaque left > right. Comparison Study: Prior negative bilateral LEV done 02/06/23 Performing Technologist: Alberta Lis RVS  Examination Guidelines: A complete evaluation includes B-mode imaging, spectral Doppler, color Doppler, and power Doppler as needed of all accessible portions of each vessel. Bilateral testing is considered an integral part of a complete examination. Limited examinations for reoccurring indications may be performed as noted. The reflux portion of the exam is performed with the patient in reverse Trendelenburg.  +---------+---------------+---------+-----------+----------+--------------+ RIGHT    CompressibilityPhasicitySpontaneityPropertiesThrombus Aging +---------+---------------+---------+-----------+----------+--------------+ CFV      Full           Yes      Yes                                 +---------+---------------+---------+-----------+----------+--------------+ SFJ      Full                                                        +---------+---------------+---------+-----------+----------+--------------+ FV Prox  Full           Yes      Yes                                  +---------+---------------+---------+-----------+----------+--------------+ FV Mid   Full           Yes      No                                  +---------+---------------+---------+-----------+----------+--------------+ FV DistalFull           Yes      No                                  +---------+---------------+---------+-----------+----------+--------------+ PFV      Full                                                        +---------+---------------+---------+-----------+----------+--------------+ POP      Full           Yes      No                                  +---------+---------------+---------+-----------+----------+--------------+ PTV      Full                                                        +---------+---------------+---------+-----------+----------+--------------+ PERO     Full                                                        +---------+---------------+---------+-----------+----------+--------------+   +---------+---------------+---------+-----------+----------+--------------+  LEFT     CompressibilityPhasicitySpontaneityPropertiesThrombus Aging +---------+---------------+---------+-----------+----------+--------------+ CFV      Full           Yes      Yes                                 +---------+---------------+---------+-----------+----------+--------------+ SFJ      Full                                                        +---------+---------------+---------+-----------+----------+--------------+ FV Prox  Full                                                        +---------+---------------+---------+-----------+----------+--------------+ FV Mid   Full                                                        +---------+---------------+---------+-----------+----------+--------------+ FV DistalFull                                                         +---------+---------------+---------+-----------+----------+--------------+ PFV      Full                                                        +---------+---------------+---------+-----------+----------+--------------+ POP      Full           Yes      No                                  +---------+---------------+---------+-----------+----------+--------------+ PTV      Full                                                        +---------+---------------+---------+-----------+----------+--------------+ PERO     Full                                                        +---------+---------------+---------+-----------+----------+--------------+     Summary: RIGHT: - There is no evidence of deep vein thrombosis in the lower extremity.  - No cystic structure found in the popliteal fossa.  LEFT: - There is no evidence of deep vein thrombosis in the lower extremity.  -  No cystic structure found in the popliteal fossa. - Patient had abnormal ABI, spot check of lower extremity arteries revealed significant calcific plaque throughout with monophasic flow in the Common femoral, superficial femoral, and popliteal arteries and dampened monophasic flow noted in the posterior  tibial and anterior tibial arteries.  *See table(s) above for measurements and observations. Electronically signed by Debby Robertson on 03/08/2024 at 8:01:35 PM.    Final    VAS US  ABI WITH/WO TBI Result Date: 03/08/2024  LOWER EXTREMITY DOPPLER STUDY Patient Name:  Clayten Allcock  Date of Exam:   03/08/2024 Medical Rec #: [id number]    Accession #:    [id number] Date of Birth: [birth year]    Patient Gender: M Patient Age:   56 years Exam Location:  [facility] Procedure:      VAS US  ABI WITH/WO TBI Referring Phys: JULIE MACHEN --------------------------------------------------------------------------------  Indications: Mottled, cold extremities. Flu. COPD exacerbation. Respiratory              distress, on BiPAP. High  Risk         Hypertension, current smoker, prior MI, coronary artery Factors:          disease. Other Factors: Atrial flutter. IV drug use (currently using methamphetamine,                history of heroin). Noncompliance.  Comparison Study: Prior ABI done 04/20/2021 Performing Technologist: Rachel Pellet RVS  Examination Guidelines: A complete evaluation includes at minimum, Doppler waveform signals and systolic blood pressure reading at the level of bilateral brachial, anterior tibial, and posterior tibial arteries, when vessel segments are accessible. Bilateral testing is considered an integral part of a complete examination. Photoelectric Plethysmograph (PPG) waveforms and toe systolic pressure readings are included as required and additional duplex testing as needed. Limited examinations for reoccurring indications may be performed as noted.  ABI Findings: +---------+------------------+-----+---------+--------+ Right    Rt Pressure (mmHg)IndexWaveform Comment  +---------+------------------+-----+---------+--------+ Brachial 82                     triphasic         +---------+------------------+-----+---------+--------+ PTA      81                0.96 biphasic          +---------+------------------+-----+---------+--------+ DP       57                0.68 biphasic          +---------+------------------+-----+---------+--------+ Great Toe0                 0.00 Absent            +---------+------------------+-----+---------+--------+ +---------+------------------+-----+---------+-------+ Left     Lt Pressure (mmHg)IndexWaveform Comment +---------+------------------+-----+---------+-------+ Brachial 84                     triphasic        +---------+------------------+-----+---------+-------+ PTA                             absent           +---------+------------------+-----+---------+-------+ DP                              absent            +---------+------------------+-----+---------+-------+ Burnetta Heck  0.00 Absent           +---------+------------------+-----+---------+-------+ +-------+-----------+-----------+------------+------------+ ABI/TBIToday's ABIToday's TBIPrevious ABIPrevious TBI +-------+-----------+-----------+------------+------------+ Right  0.96       absent     0.99        absent       +-------+-----------+-----------+------------+------------+ Left   absent     absent     0.78        absent       +-------+-----------+-----------+------------+------------+ Right ABIs and bilateral TBIs appear essentially unchanged compared to prior study on 04/20/21. Left ABIs appear decreased compared to prior study on 04/20/21.  Summary: Right: Resting right ankle-brachial index is within normal range. The right toe-brachial index is abnormal.  Left: Resting left ankle-brachial index indicates critical left limb ischemia. The left toe-brachial index is abnormal.  *See table(s) above for measurements and observations.  Electronically signed by Debby Robertson on 03/08/2024 at 8:01:16 PM.    Final    ECHOCARDIOGRAM COMPLETE Result Date: 03/08/2024    ECHOCARDIOGRAM REPORT   Patient Name:   JADIE COMAS Date of Exam: 03/08/2024 Medical Rec #:  [id number]   Height:       72.0 in Accession #:    [id number]  Weight:       206.0 lb Date of Birth:  29-Jul-1960   BSA:          2.157 m Patient Age:    63 years    BP:           112/77 mmHg Patient Gender: M           HR:           79 bpm. Exam Location:  Inpatient Procedure: 2D Echo, Cardiac Doppler, Color Doppler and Intracardiac            Opacification Agent (Both Spectral and Color Flow Doppler were            utilized during procedure). Indications:    Chest Pain R0.7.9  History:        Patient has prior history of Echocardiogram examinations, most                 recent 08/14/2023. Cardiomyopathy, CAD, COPD, Pulmonary HTN,                 Stroke and TIA; Risk  Factors:Hypertension and Former Smoker.  Sonographer:    Merlynn Argyle Referring Phys: [id number] [facility]  Sonographer Comments: Technically difficult study due to poor echo windows, echo performed with patient supine and on artificial respirator, patient is obese and suboptimal parasternal window. Image acquisition challenging due to COPD and Image acquisition challenging due to respiratory motion. IMPRESSIONS  1. Left ventricular ejection fraction, by estimation, is 70 to 75%. The left ventricle has hyperdynamic function. Left ventricular endocardial border not optimally defined to evaluate regional wall motion. There is mild left ventricular hypertrophy. Left ventricular diastolic parameters are indeterminate.  2. Right ventricular systolic function is mildly reduced. The right ventricular size is mildly enlarged. Tricuspid regurgitation signal is inadequate for assessing PA pressure.  3. The mitral valve is grossly normal. No evidence of mitral valve regurgitation.  4. The aortic valve is grossly normal. There is mild calcification of the aortic valve. Aortic valve regurgitation is not visualized.  5. The inferior vena cava is normal in size with <50% respiratory variability, suggesting right atrial pressure of 8 mmHg. FINDINGS  Left Ventricle: Left ventricular ejection fraction, by estimation, is 70 to 75%. The left ventricle has  hyperdynamic function. Left ventricular endocardial border not optimally defined to evaluate regional wall motion. Definity  contrast agent was given IV to delineate the left ventricular endocardial borders. The left ventricular internal cavity size was normal in size. There is mild left ventricular hypertrophy. Left ventricular diastolic parameters are indeterminate. Right Ventricle: The right ventricular size is mildly enlarged. Right vetricular wall thickness was not well visualized. Right ventricular systolic function is mildly reduced. Tricuspid regurgitation signal is inadequate  for assessing PA pressure. Left Atrium: Left atrial size was normal in size. Right Atrium: Right atrial size was not well visualized. Pericardium: Trivial pericardial effusion is present. Presence of epicardial fat layer. Mitral Valve: The mitral valve is grossly normal. No evidence of mitral valve regurgitation. Tricuspid Valve: The tricuspid valve is not well visualized. Tricuspid valve regurgitation is trivial. Aortic Valve: The aortic valve is grossly normal. There is mild calcification of the aortic valve. Aortic valve regurgitation is not visualized. Pulmonic Valve: The pulmonic valve was not well visualized. Pulmonic valve regurgitation is not visualized. Aorta: The aortic root is normal in size and structure. Venous: IVC assessment for right atrial pressure unable to be performed due to mechanical ventilation. The inferior vena cava is normal in size with less than 50% respiratory variability, suggesting right atrial pressure of 8 mmHg. IAS/Shunts: No atrial level shunt detected by color flow Doppler.  LEFT VENTRICLE PLAX 2D LVIDd:         3.50 cm   Diastology LVIDs:         2.90 cm   LV e' lateral:   4.13 cm/s LV PW:         1.30 cm   LV E/e' lateral: 11.6 LV IVS:        1.20 cm LVOT diam:     1.90 cm LV SV:         27 LV SV Index:   13 LVOT Area:     2.84 cm  RIGHT VENTRICLE            IVC RV S prime:     6.40 cm/s  IVC diam: 1.80 cm LEFT ATRIUM           Index LA diam:      4.10 cm 1.90 cm/m LA Vol (A4C): 28.8 ml 13.35 ml/m  AORTIC VALVE LVOT Vmax:   66.40 cm/s LVOT Vmean:  54.400 cm/s LVOT VTI:    0.096 m  AORTA Ao Root diam: 3.40 cm MITRAL VALVE MV Area (PHT): 2.21 cm    SHUNTS MV Decel Time: 343 msec    Systemic VTI:  0.10 m MV E velocity: 48.10 cm/s  Systemic Diam: 1.90 cm MV A velocity: 38.60 cm/s MV E/A ratio:  1.25 Soyla Merck MD Electronically signed by Soyla Merck MD Signature Date/Time: 03/08/2024/1:22:13 PM    Final    DG Chest Port 1 View Result Date: 03/08/2024 CLINICAL DATA:   Acute respiratory failure EXAM: PORTABLE CHEST 1 VIEW COMPARISON:  Chest radiograph dated 03/07/2024 FINDINGS: Normal lung volumes. Subtle nodular opacity projecting over the lateral right upper lung. No pleural effusion or pneumothorax. The heart size and mediastinal contours are within normal limits. No acute osseous abnormality. IMPRESSION: Subtle nodular opacity projecting over the lateral right upper lung, which may represent a focus of infection or inflammation in the acute setting. Electronically Signed   By: Limin  Xu M.D.   On: 03/08/2024 10:35   DG Chest Port 1 View Result Date: 03/07/2024 CLINICAL DATA:  Concern for sepsis. EXAM:  PORTABLE CHEST 1 VIEW COMPARISON:  03/03/2024. FINDINGS: The heart size and mediastinal contours are within normal limits. Both lungs are clear. No pleural effusion or pneumothorax. No acute osseous abnormality. IMPRESSION: No acute cardiopulmonary findings. Electronically Signed   By: Harrietta Sherry M.D.   On: 03/07/2024 10:12     Discharge Instructions: Discharge Instructions     Diet - low sodium heart healthy   Complete by: As directed    Increase activity slowly   Complete by: As directed        Signed: Bernadine Manos, MD 03/14/2024, 4:49 PM

## 2024-03-14 NOTE — Progress Notes (Signed)
 Reviewed AVS, patient expressed understanding of medications, MD follow up reviewed.   Removed IV, Site clean, dry and intact.  See LDA for information on wounds at discharge. Patient states all belongings brought to the hospital at time of admission are accounted for and packed to take home.  Picked up medications from [facility] pharmacy. Nursing staff contacted to transport patient to entrance A, where family member was waiting in vehicle to transport home.

## 2024-03-14 NOTE — Progress Notes (Signed)
  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

## 2024-03-14 NOTE — Progress Notes (Signed)
 [facility] 4NP04 [facility] Liaison Note  Received a request from [facility] of Care Manager for hospice services at home after discharge. Spoke with friend, Carliss to initiate education related to hospice philosophy, services and team approach to care.  Carliss verbalized understanding of information given.  Per discussion, the plan is for discharge home by private vehicle today.  DME needs discussed.  Patient has oxygen  concentrator with [employer].  A hospital bed, over bed table, transport wheelchair and bedside commode have been ordered.  A NIV machine has been ordered as well.    Please send signed and completed DNR home with the patient/family.  Please provide prescriptions at discharge as needed to ensure ongoing symptom management.  [facility] information and contact numbers given to [employer].  Above information shared with Kristi, Transitions of Care Manager.  Please call with any questions or concerns.  Thank you for the opportunity to participate in this patient's care.  Inocente Jacobs, BSN, RN [employer] [phone]

## 2024-03-14 NOTE — Progress Notes (Addendum)
 Pt now comfort care and wanting to go home today on hospice per chart review.  PMT plan pending.  Nothing further to add.  PCCM will sign off.  Please call us  back if we can be of any further assistance.      Lyle Pesa, NP  Pulmonary & Critical Care 03/14/2024, 11:31 AM  See Amion for pager If no response to pager , please call [phone] until 7pm After 7:00 pm call Elink  [phone]     Comfort care very appropriate in the setting of end stage COPD. If not adequate support at home recommend outpatient hospice facility.   Verdon Gore, MD Pulmonary and Critical Care Medicine [facility] 03/14/2024 11:40 AM Pager: see AMION  If no response to pager, please call critical care on call (see AMION) until 7pm After 7:00 pm call Elink

## 2024-03-16 ENCOUNTER — Telehealth: Payer: Self-pay

## 2024-03-16 ENCOUNTER — Other Ambulatory Visit (HOSPITAL_COMMUNITY): Payer: Self-pay

## 2024-03-22 ENCOUNTER — Ambulatory Visit: Admitting: Student

## 2024-03-27 ENCOUNTER — Other Ambulatory Visit (HOSPITAL_COMMUNITY): Payer: Self-pay

## 2024-03-28 ENCOUNTER — Other Ambulatory Visit (HOSPITAL_COMMUNITY): Payer: Self-pay

## 2024-03-30 ENCOUNTER — Other Ambulatory Visit: Payer: Self-pay

## 2024-03-30 DIAGNOSIS — Z419 Encounter for procedure for purposes other than remedying health state, unspecified: Secondary | ICD-10-CM | POA: Diagnosis not present

## 2024-04-03 ENCOUNTER — Other Ambulatory Visit: Payer: Self-pay

## 2024-04-04 ENCOUNTER — Ambulatory Visit: Admitting: Pulmonary Disease
